# Patient Record
Sex: Female | Born: 1940 | Race: White | Hispanic: No | Marital: Married | Smoking: Former smoker
Health system: Southern US, Community
[De-identification: ages and names within clinical notes are randomized; demographics above are authoritative.]

## PROBLEM LIST (undated history)

## (undated) DIAGNOSIS — B029 Zoster without complications: Secondary | ICD-10-CM

## (undated) DIAGNOSIS — I10 Essential (primary) hypertension: Secondary | ICD-10-CM

## (undated) DIAGNOSIS — N183 Chronic kidney disease, stage 3 unspecified: Secondary | ICD-10-CM

## (undated) DIAGNOSIS — I493 Ventricular premature depolarization: Secondary | ICD-10-CM

## (undated) DIAGNOSIS — I340 Nonrheumatic mitral (valve) insufficiency: Secondary | ICD-10-CM

## (undated) DIAGNOSIS — Z8719 Personal history of other diseases of the digestive system: Secondary | ICD-10-CM

## (undated) DIAGNOSIS — Z9889 Other specified postprocedural states: Secondary | ICD-10-CM

## (undated) DIAGNOSIS — N3281 Overactive bladder: Secondary | ICD-10-CM

## (undated) DIAGNOSIS — J189 Pneumonia, unspecified organism: Secondary | ICD-10-CM

## (undated) DIAGNOSIS — I951 Orthostatic hypotension: Secondary | ICD-10-CM

## (undated) DIAGNOSIS — K219 Gastro-esophageal reflux disease without esophagitis: Secondary | ICD-10-CM

## (undated) DIAGNOSIS — A498 Other bacterial infections of unspecified site: Secondary | ICD-10-CM

## (undated) DIAGNOSIS — N200 Calculus of kidney: Secondary | ICD-10-CM

## (undated) DIAGNOSIS — G459 Transient cerebral ischemic attack, unspecified: Secondary | ICD-10-CM

## (undated) DIAGNOSIS — I639 Cerebral infarction, unspecified: Secondary | ICD-10-CM

## (undated) DIAGNOSIS — C4491 Basal cell carcinoma of skin, unspecified: Secondary | ICD-10-CM

## (undated) DIAGNOSIS — J449 Chronic obstructive pulmonary disease, unspecified: Secondary | ICD-10-CM

## (undated) DIAGNOSIS — F329 Major depressive disorder, single episode, unspecified: Secondary | ICD-10-CM

## (undated) DIAGNOSIS — K222 Esophageal obstruction: Secondary | ICD-10-CM

## (undated) DIAGNOSIS — I2510 Atherosclerotic heart disease of native coronary artery without angina pectoris: Secondary | ICD-10-CM

## (undated) DIAGNOSIS — J309 Allergic rhinitis, unspecified: Secondary | ICD-10-CM

## (undated) DIAGNOSIS — I5042 Chronic combined systolic (congestive) and diastolic (congestive) heart failure: Secondary | ICD-10-CM

## (undated) DIAGNOSIS — I428 Other cardiomyopathies: Secondary | ICD-10-CM

## (undated) DIAGNOSIS — R911 Solitary pulmonary nodule: Secondary | ICD-10-CM

## (undated) DIAGNOSIS — R55 Syncope and collapse: Secondary | ICD-10-CM

## (undated) DIAGNOSIS — M1990 Unspecified osteoarthritis, unspecified site: Secondary | ICD-10-CM

## (undated) DIAGNOSIS — K559 Vascular disorder of intestine, unspecified: Secondary | ICD-10-CM

## (undated) DIAGNOSIS — E785 Hyperlipidemia, unspecified: Secondary | ICD-10-CM

## (undated) DIAGNOSIS — K449 Diaphragmatic hernia without obstruction or gangrene: Secondary | ICD-10-CM

## (undated) DIAGNOSIS — H269 Unspecified cataract: Secondary | ICD-10-CM

## (undated) DIAGNOSIS — I480 Paroxysmal atrial fibrillation: Secondary | ICD-10-CM

## (undated) DIAGNOSIS — R001 Bradycardia, unspecified: Secondary | ICD-10-CM

## (undated) DIAGNOSIS — F419 Anxiety disorder, unspecified: Secondary | ICD-10-CM

## (undated) DIAGNOSIS — I253 Aneurysm of heart: Secondary | ICD-10-CM

## (undated) DIAGNOSIS — Z87442 Personal history of urinary calculi: Secondary | ICD-10-CM

## (undated) HISTORY — DX: Chronic combined systolic (congestive) and diastolic (congestive) heart failure: I50.42

## (undated) HISTORY — DX: Paroxysmal atrial fibrillation: I48.0

## (undated) HISTORY — DX: Unspecified osteoarthritis, unspecified site: M19.90

## (undated) HISTORY — PX: COLONOSCOPY: SHX174

## (undated) HISTORY — DX: Vascular disorder of intestine, unspecified: K55.9

## (undated) HISTORY — DX: Hyperlipidemia, unspecified: E78.5

## (undated) HISTORY — DX: Basal cell carcinoma of skin, unspecified: C44.91

## (undated) HISTORY — DX: Unspecified cataract: H26.9

## (undated) HISTORY — PX: UPPER GASTROINTESTINAL ENDOSCOPY: SHX188

## (undated) HISTORY — DX: Calculus of kidney: N20.0

## (undated) HISTORY — PX: VARICOSE VEIN SURGERY: SHX832

## (undated) HISTORY — DX: Overactive bladder: N32.81

## (undated) HISTORY — DX: Syncope and collapse: R55

## (undated) HISTORY — DX: Other bacterial infections of unspecified site: A49.8

## (undated) HISTORY — DX: Esophageal obstruction: K22.2

## (undated) HISTORY — DX: Major depressive disorder, single episode, unspecified: F32.9

## (undated) HISTORY — DX: Aneurysm of heart: I25.3

## (undated) HISTORY — DX: Other cardiomyopathies: I42.8

## (undated) HISTORY — PX: BLADDER SUSPENSION: SHX72

## (undated) HISTORY — DX: Personal history of other diseases of the digestive system: Z87.19

## (undated) HISTORY — DX: Chronic kidney disease, stage 3 unspecified: N18.30

## (undated) HISTORY — DX: Transient cerebral ischemic attack, unspecified: G45.9

## (undated) HISTORY — PX: OTHER SURGICAL HISTORY: SHX169

## (undated) HISTORY — PX: FINGER SURGERY: SHX640

## (undated) HISTORY — DX: Pneumonia, unspecified organism: J18.9

## (undated) HISTORY — DX: Bradycardia, unspecified: R00.1

## (undated) HISTORY — DX: Chronic obstructive pulmonary disease, unspecified: J44.9

## (undated) HISTORY — DX: Ventricular premature depolarization: I49.3

## (undated) HISTORY — DX: Diaphragmatic hernia without obstruction or gangrene: K44.9

## (undated) HISTORY — DX: Other specified postprocedural states: Z98.890

## (undated) HISTORY — DX: Allergic rhinitis, unspecified: J30.9

## (undated) HISTORY — DX: Gastro-esophageal reflux disease without esophagitis: K21.9

## (undated) HISTORY — DX: Orthostatic hypotension: I95.1

## (undated) HISTORY — DX: Solitary pulmonary nodule: R91.1

## (undated) HISTORY — DX: Cerebral infarction, unspecified: I63.9

## (undated) HISTORY — DX: Essential (primary) hypertension: I10

## (undated) HISTORY — DX: Chronic kidney disease, stage 3 (moderate): N18.3

## (undated) HISTORY — DX: Atherosclerotic heart disease of native coronary artery without angina pectoris: I25.10

## (undated) HISTORY — PX: BREAST BIOPSY: SHX20

---

## 1981-12-05 HISTORY — PX: VAGINAL HYSTERECTOMY: SUR661

## 1998-04-21 ENCOUNTER — Ambulatory Visit (HOSPITAL_COMMUNITY): Admission: RE | Admit: 1998-04-21 | Discharge: 1998-04-21 | Payer: Self-pay | Admitting: Obstetrics and Gynecology

## 1998-05-26 ENCOUNTER — Other Ambulatory Visit: Admission: RE | Admit: 1998-05-26 | Discharge: 1998-05-26 | Payer: Self-pay | Admitting: Obstetrics and Gynecology

## 1999-05-21 ENCOUNTER — Ambulatory Visit (HOSPITAL_COMMUNITY): Admission: RE | Admit: 1999-05-21 | Discharge: 1999-05-21 | Payer: Self-pay | Admitting: Obstetrics and Gynecology

## 1999-06-11 ENCOUNTER — Other Ambulatory Visit: Admission: RE | Admit: 1999-06-11 | Discharge: 1999-06-11 | Payer: Self-pay | Admitting: Obstetrics and Gynecology

## 1999-12-06 DIAGNOSIS — I639 Cerebral infarction, unspecified: Secondary | ICD-10-CM

## 1999-12-06 HISTORY — DX: Cerebral infarction, unspecified: I63.9

## 2000-05-22 ENCOUNTER — Encounter: Payer: Self-pay | Admitting: Obstetrics and Gynecology

## 2000-05-22 ENCOUNTER — Ambulatory Visit (HOSPITAL_COMMUNITY): Admission: RE | Admit: 2000-05-22 | Discharge: 2000-05-22 | Payer: Self-pay | Admitting: Obstetrics and Gynecology

## 2000-06-16 ENCOUNTER — Other Ambulatory Visit: Admission: RE | Admit: 2000-06-16 | Discharge: 2000-06-16 | Payer: Self-pay | Admitting: Obstetrics and Gynecology

## 2001-04-12 ENCOUNTER — Ambulatory Visit (HOSPITAL_COMMUNITY): Admission: RE | Admit: 2001-04-12 | Discharge: 2001-04-12 | Payer: Self-pay | Admitting: Gastroenterology

## 2001-05-05 ENCOUNTER — Encounter: Payer: Self-pay | Admitting: Internal Medicine

## 2001-05-05 LAB — CONVERTED CEMR LAB

## 2001-05-30 ENCOUNTER — Ambulatory Visit (HOSPITAL_COMMUNITY): Admission: RE | Admit: 2001-05-30 | Discharge: 2001-05-30 | Payer: Self-pay | Admitting: Obstetrics and Gynecology

## 2001-05-30 ENCOUNTER — Encounter: Payer: Self-pay | Admitting: Obstetrics and Gynecology

## 2001-06-01 ENCOUNTER — Encounter: Admission: RE | Admit: 2001-06-01 | Discharge: 2001-06-01 | Payer: Self-pay | Admitting: Obstetrics and Gynecology

## 2001-06-01 ENCOUNTER — Encounter: Payer: Self-pay | Admitting: Obstetrics and Gynecology

## 2001-06-18 ENCOUNTER — Other Ambulatory Visit: Admission: RE | Admit: 2001-06-18 | Discharge: 2001-06-18 | Payer: Self-pay | Admitting: Obstetrics and Gynecology

## 2002-06-06 ENCOUNTER — Ambulatory Visit (HOSPITAL_COMMUNITY): Admission: RE | Admit: 2002-06-06 | Discharge: 2002-06-06 | Payer: Self-pay | Admitting: Obstetrics and Gynecology

## 2002-06-06 ENCOUNTER — Encounter: Payer: Self-pay | Admitting: Obstetrics and Gynecology

## 2002-06-18 ENCOUNTER — Other Ambulatory Visit: Admission: RE | Admit: 2002-06-18 | Discharge: 2002-06-18 | Payer: Self-pay | Admitting: Obstetrics and Gynecology

## 2002-08-31 ENCOUNTER — Encounter: Payer: Self-pay | Admitting: Internal Medicine

## 2002-08-31 ENCOUNTER — Encounter: Admission: RE | Admit: 2002-08-31 | Discharge: 2002-08-31 | Payer: Self-pay | Admitting: Internal Medicine

## 2002-10-01 ENCOUNTER — Encounter: Admission: RE | Admit: 2002-10-01 | Discharge: 2002-10-01 | Payer: Self-pay | Admitting: Neurology

## 2002-10-01 ENCOUNTER — Encounter: Payer: Self-pay | Admitting: Neurology

## 2002-11-25 ENCOUNTER — Ambulatory Visit (HOSPITAL_COMMUNITY): Admission: RE | Admit: 2002-11-25 | Discharge: 2002-11-25 | Payer: Self-pay | Admitting: Cardiology

## 2002-11-25 ENCOUNTER — Encounter: Payer: Self-pay | Admitting: Cardiology

## 2003-03-25 ENCOUNTER — Ambulatory Visit (HOSPITAL_COMMUNITY): Admission: RE | Admit: 2003-03-25 | Discharge: 2003-03-25 | Payer: Self-pay | Admitting: Gastroenterology

## 2003-03-25 LAB — HM COLONOSCOPY

## 2003-03-26 ENCOUNTER — Ambulatory Visit (HOSPITAL_COMMUNITY): Admission: RE | Admit: 2003-03-26 | Discharge: 2003-03-26 | Payer: Self-pay | Admitting: Gastroenterology

## 2003-03-26 ENCOUNTER — Encounter: Payer: Self-pay | Admitting: Gastroenterology

## 2003-06-05 ENCOUNTER — Encounter: Admission: RE | Admit: 2003-06-05 | Discharge: 2003-09-03 | Payer: Self-pay | Admitting: Gastroenterology

## 2003-06-11 ENCOUNTER — Ambulatory Visit (HOSPITAL_COMMUNITY): Admission: RE | Admit: 2003-06-11 | Discharge: 2003-06-11 | Payer: Self-pay | Admitting: Obstetrics and Gynecology

## 2003-06-11 ENCOUNTER — Encounter: Payer: Self-pay | Admitting: Obstetrics and Gynecology

## 2003-09-24 ENCOUNTER — Ambulatory Visit (HOSPITAL_BASED_OUTPATIENT_CLINIC_OR_DEPARTMENT_OTHER): Admission: RE | Admit: 2003-09-24 | Discharge: 2003-09-24 | Payer: Self-pay | Admitting: *Deleted

## 2003-09-24 ENCOUNTER — Ambulatory Visit (HOSPITAL_COMMUNITY): Admission: RE | Admit: 2003-09-24 | Discharge: 2003-09-24 | Payer: Self-pay | Admitting: *Deleted

## 2004-06-11 ENCOUNTER — Ambulatory Visit (HOSPITAL_COMMUNITY): Admission: RE | Admit: 2004-06-11 | Discharge: 2004-06-11 | Payer: Self-pay | Admitting: Obstetrics and Gynecology

## 2004-07-15 ENCOUNTER — Other Ambulatory Visit: Admission: RE | Admit: 2004-07-15 | Discharge: 2004-07-15 | Payer: Self-pay | Admitting: Obstetrics and Gynecology

## 2004-10-14 ENCOUNTER — Ambulatory Visit: Payer: Self-pay | Admitting: Internal Medicine

## 2004-10-20 ENCOUNTER — Ambulatory Visit: Payer: Self-pay | Admitting: *Deleted

## 2004-11-10 ENCOUNTER — Ambulatory Visit: Payer: Self-pay | Admitting: Cardiology

## 2004-11-24 ENCOUNTER — Ambulatory Visit: Payer: Self-pay | Admitting: *Deleted

## 2004-12-22 ENCOUNTER — Ambulatory Visit: Payer: Self-pay | Admitting: Cardiology

## 2005-01-13 ENCOUNTER — Ambulatory Visit: Payer: Self-pay | Admitting: Internal Medicine

## 2005-01-19 ENCOUNTER — Ambulatory Visit: Payer: Self-pay | Admitting: Cardiovascular Disease

## 2005-02-07 ENCOUNTER — Ambulatory Visit: Payer: Self-pay | Admitting: Cardiology

## 2005-02-15 ENCOUNTER — Ambulatory Visit: Payer: Self-pay | Admitting: Internal Medicine

## 2005-02-23 ENCOUNTER — Ambulatory Visit: Payer: Self-pay | Admitting: Cardiovascular Disease

## 2005-03-16 ENCOUNTER — Ambulatory Visit: Payer: Self-pay | Admitting: Internal Medicine

## 2005-03-31 ENCOUNTER — Ambulatory Visit: Payer: Self-pay | Admitting: Internal Medicine

## 2005-04-11 ENCOUNTER — Encounter: Admission: RE | Admit: 2005-04-11 | Discharge: 2005-07-10 | Payer: Self-pay | Admitting: Family Medicine

## 2005-04-11 ENCOUNTER — Ambulatory Visit: Payer: Self-pay | Admitting: *Deleted

## 2005-05-09 ENCOUNTER — Ambulatory Visit: Payer: Self-pay | Admitting: Cardiology

## 2005-06-06 ENCOUNTER — Ambulatory Visit: Payer: Self-pay | Admitting: Cardiology

## 2005-06-13 ENCOUNTER — Ambulatory Visit (HOSPITAL_COMMUNITY): Admission: RE | Admit: 2005-06-13 | Discharge: 2005-06-13 | Payer: Self-pay | Admitting: Obstetrics and Gynecology

## 2005-07-04 ENCOUNTER — Ambulatory Visit: Payer: Self-pay | Admitting: Cardiology

## 2005-07-18 ENCOUNTER — Other Ambulatory Visit: Admission: RE | Admit: 2005-07-18 | Discharge: 2005-07-18 | Payer: Self-pay | Admitting: Obstetrics and Gynecology

## 2005-07-18 ENCOUNTER — Ambulatory Visit: Payer: Self-pay | Admitting: Internal Medicine

## 2005-08-01 ENCOUNTER — Ambulatory Visit: Payer: Self-pay | Admitting: Cardiology

## 2005-08-15 ENCOUNTER — Ambulatory Visit: Payer: Self-pay | Admitting: Internal Medicine

## 2005-08-29 ENCOUNTER — Ambulatory Visit: Payer: Self-pay | Admitting: Internal Medicine

## 2005-09-23 ENCOUNTER — Ambulatory Visit: Payer: Self-pay | Admitting: Cardiovascular Disease

## 2005-10-17 ENCOUNTER — Ambulatory Visit: Payer: Self-pay | Admitting: Cardiology

## 2005-10-24 ENCOUNTER — Ambulatory Visit: Payer: Self-pay | Admitting: Internal Medicine

## 2005-11-14 ENCOUNTER — Ambulatory Visit: Payer: Self-pay | Admitting: Cardiology

## 2005-11-24 ENCOUNTER — Ambulatory Visit: Payer: Self-pay | Admitting: Internal Medicine

## 2005-12-12 ENCOUNTER — Ambulatory Visit: Payer: Self-pay | Admitting: Cardiology

## 2005-12-26 ENCOUNTER — Ambulatory Visit: Payer: Self-pay | Admitting: Internal Medicine

## 2006-01-16 ENCOUNTER — Ambulatory Visit: Payer: Self-pay | Admitting: Internal Medicine

## 2006-02-06 ENCOUNTER — Ambulatory Visit: Payer: Self-pay | Admitting: Cardiology

## 2006-03-06 ENCOUNTER — Ambulatory Visit: Payer: Self-pay | Admitting: Cardiology

## 2006-04-05 ENCOUNTER — Ambulatory Visit: Payer: Self-pay | Admitting: Cardiology

## 2006-04-19 ENCOUNTER — Ambulatory Visit: Payer: Self-pay | Admitting: Cardiology

## 2006-05-22 ENCOUNTER — Ambulatory Visit: Payer: Self-pay | Admitting: Cardiology

## 2006-05-31 ENCOUNTER — Ambulatory Visit: Payer: Self-pay | Admitting: Internal Medicine

## 2006-06-15 ENCOUNTER — Ambulatory Visit (HOSPITAL_COMMUNITY): Admission: RE | Admit: 2006-06-15 | Discharge: 2006-06-15 | Payer: Self-pay | Admitting: Obstetrics and Gynecology

## 2006-06-21 ENCOUNTER — Ambulatory Visit: Payer: Self-pay | Admitting: Cardiovascular Disease

## 2006-06-26 ENCOUNTER — Encounter (INDEPENDENT_AMBULATORY_CARE_PROVIDER_SITE_OTHER): Payer: Self-pay | Admitting: *Deleted

## 2006-06-26 ENCOUNTER — Encounter: Admission: RE | Admit: 2006-06-26 | Discharge: 2006-06-26 | Payer: Self-pay | Admitting: Obstetrics and Gynecology

## 2006-07-03 HISTORY — PX: BREAST BIOPSY: SHX20

## 2006-07-10 ENCOUNTER — Ambulatory Visit: Payer: Self-pay | Admitting: Internal Medicine

## 2006-07-19 ENCOUNTER — Ambulatory Visit: Payer: Self-pay | Admitting: Cardiology

## 2006-07-19 ENCOUNTER — Other Ambulatory Visit: Admission: RE | Admit: 2006-07-19 | Discharge: 2006-07-19 | Payer: Self-pay | Admitting: Obstetrics and Gynecology

## 2006-08-14 ENCOUNTER — Ambulatory Visit: Payer: Self-pay | Admitting: Internal Medicine

## 2006-08-14 ENCOUNTER — Ambulatory Visit: Payer: Self-pay | Admitting: Cardiology

## 2006-08-15 ENCOUNTER — Ambulatory Visit: Payer: Self-pay | Admitting: Cardiovascular Disease

## 2006-08-17 ENCOUNTER — Inpatient Hospital Stay (HOSPITAL_COMMUNITY): Admission: RE | Admit: 2006-08-17 | Discharge: 2006-08-19 | Payer: Self-pay | Admitting: Obstetrics and Gynecology

## 2006-08-17 ENCOUNTER — Ambulatory Visit: Payer: Self-pay | Admitting: Internal Medicine

## 2006-08-24 ENCOUNTER — Ambulatory Visit: Payer: Self-pay | Admitting: Cardiology

## 2006-09-01 ENCOUNTER — Ambulatory Visit: Payer: Self-pay | Admitting: Cardiology

## 2006-09-15 ENCOUNTER — Ambulatory Visit: Payer: Self-pay | Admitting: Internal Medicine

## 2006-09-20 ENCOUNTER — Ambulatory Visit: Payer: Self-pay | Admitting: *Deleted

## 2006-10-18 ENCOUNTER — Ambulatory Visit: Payer: Self-pay | Admitting: Cardiology

## 2006-11-16 ENCOUNTER — Ambulatory Visit: Payer: Self-pay | Admitting: Cardiology

## 2006-11-16 ENCOUNTER — Ambulatory Visit: Payer: Self-pay | Admitting: Internal Medicine

## 2006-12-05 HISTORY — PX: RECTOCELE REPAIR: SHX761

## 2006-12-12 ENCOUNTER — Ambulatory Visit: Payer: Self-pay | Admitting: Cardiology

## 2006-12-25 ENCOUNTER — Ambulatory Visit: Payer: Self-pay | Admitting: Internal Medicine

## 2006-12-28 ENCOUNTER — Ambulatory Visit: Payer: Self-pay | Admitting: Cardiology

## 2007-01-17 ENCOUNTER — Ambulatory Visit: Payer: Self-pay | Admitting: *Deleted

## 2007-02-14 ENCOUNTER — Ambulatory Visit: Payer: Self-pay | Admitting: Cardiology

## 2007-03-14 ENCOUNTER — Ambulatory Visit: Payer: Self-pay | Admitting: Internal Medicine

## 2007-04-12 ENCOUNTER — Ambulatory Visit: Payer: Self-pay | Admitting: Cardiology

## 2007-05-15 ENCOUNTER — Ambulatory Visit: Payer: Self-pay | Admitting: Cardiology

## 2007-05-31 ENCOUNTER — Ambulatory Visit: Payer: Self-pay | Admitting: Internal Medicine

## 2007-05-31 ENCOUNTER — Ambulatory Visit: Payer: Self-pay | Admitting: Cardiology

## 2007-06-28 ENCOUNTER — Ambulatory Visit: Payer: Self-pay | Admitting: Cardiology

## 2007-06-29 ENCOUNTER — Ambulatory Visit: Payer: Self-pay | Admitting: Internal Medicine

## 2007-07-26 ENCOUNTER — Ambulatory Visit: Payer: Self-pay | Admitting: Cardiology

## 2007-08-02 ENCOUNTER — Other Ambulatory Visit: Admission: RE | Admit: 2007-08-02 | Discharge: 2007-08-02 | Payer: Self-pay | Admitting: Obstetrics and Gynecology

## 2007-08-08 ENCOUNTER — Encounter: Admission: RE | Admit: 2007-08-08 | Discharge: 2007-08-08 | Payer: Self-pay | Admitting: Obstetrics and Gynecology

## 2007-08-09 ENCOUNTER — Ambulatory Visit: Payer: Self-pay | Admitting: Internal Medicine

## 2007-08-11 ENCOUNTER — Encounter: Payer: Self-pay | Admitting: Internal Medicine

## 2007-08-11 DIAGNOSIS — E785 Hyperlipidemia, unspecified: Secondary | ICD-10-CM | POA: Insufficient documentation

## 2007-08-11 DIAGNOSIS — K219 Gastro-esophageal reflux disease without esophagitis: Secondary | ICD-10-CM | POA: Insufficient documentation

## 2007-08-11 DIAGNOSIS — K649 Unspecified hemorrhoids: Secondary | ICD-10-CM | POA: Insufficient documentation

## 2007-08-11 DIAGNOSIS — Z87898 Personal history of other specified conditions: Secondary | ICD-10-CM | POA: Insufficient documentation

## 2007-08-11 DIAGNOSIS — K589 Irritable bowel syndrome without diarrhea: Secondary | ICD-10-CM | POA: Insufficient documentation

## 2007-08-11 DIAGNOSIS — Z8679 Personal history of other diseases of the circulatory system: Secondary | ICD-10-CM | POA: Insufficient documentation

## 2007-08-11 DIAGNOSIS — J441 Chronic obstructive pulmonary disease with (acute) exacerbation: Secondary | ICD-10-CM | POA: Insufficient documentation

## 2007-08-11 DIAGNOSIS — I10 Essential (primary) hypertension: Secondary | ICD-10-CM | POA: Insufficient documentation

## 2007-08-17 ENCOUNTER — Ambulatory Visit: Payer: Self-pay | Admitting: Cardiology

## 2007-08-21 ENCOUNTER — Ambulatory Visit (HOSPITAL_COMMUNITY): Admission: RE | Admit: 2007-08-21 | Discharge: 2007-08-22 | Payer: Self-pay | Admitting: Urology

## 2007-08-27 ENCOUNTER — Ambulatory Visit: Payer: Self-pay | Admitting: Internal Medicine

## 2007-09-03 ENCOUNTER — Ambulatory Visit: Payer: Self-pay | Admitting: Cardiovascular Disease

## 2007-10-01 ENCOUNTER — Ambulatory Visit: Payer: Self-pay | Admitting: Cardiology

## 2007-10-04 ENCOUNTER — Ambulatory Visit: Payer: Self-pay | Admitting: Internal Medicine

## 2007-10-04 ENCOUNTER — Encounter: Payer: Self-pay | Admitting: Internal Medicine

## 2007-10-04 DIAGNOSIS — Z9889 Other specified postprocedural states: Secondary | ICD-10-CM | POA: Insufficient documentation

## 2007-10-04 DIAGNOSIS — N816 Rectocele: Secondary | ICD-10-CM | POA: Insufficient documentation

## 2007-10-05 DIAGNOSIS — Z87442 Personal history of urinary calculi: Secondary | ICD-10-CM | POA: Insufficient documentation

## 2007-10-10 ENCOUNTER — Encounter: Payer: Self-pay | Admitting: Internal Medicine

## 2007-10-13 ENCOUNTER — Encounter: Payer: Self-pay | Admitting: Internal Medicine

## 2007-10-15 ENCOUNTER — Ambulatory Visit: Payer: Self-pay | Admitting: Internal Medicine

## 2007-10-22 ENCOUNTER — Ambulatory Visit: Payer: Self-pay | Admitting: Cardiology

## 2007-11-05 ENCOUNTER — Ambulatory Visit: Payer: Self-pay | Admitting: Critical Care Medicine

## 2007-11-05 ENCOUNTER — Ambulatory Visit: Payer: Self-pay | Admitting: Internal Medicine

## 2007-11-05 LAB — CONVERTED CEMR LAB
INR: 1.6 — ABNORMAL HIGH (ref 0.8–1.0)
Prothrombin Time: 15.5 s — ABNORMAL HIGH (ref 10.9–13.3)

## 2007-11-22 ENCOUNTER — Ambulatory Visit: Payer: Self-pay | Admitting: Cardiology

## 2007-12-10 ENCOUNTER — Ambulatory Visit: Payer: Self-pay | Admitting: Cardiology

## 2007-12-24 ENCOUNTER — Ambulatory Visit: Payer: Self-pay | Admitting: Cardiology

## 2008-01-21 ENCOUNTER — Ambulatory Visit: Payer: Self-pay | Admitting: Cardiovascular Disease

## 2008-01-28 ENCOUNTER — Ambulatory Visit: Payer: Self-pay | Admitting: Critical Care Medicine

## 2008-02-14 ENCOUNTER — Telehealth (INDEPENDENT_AMBULATORY_CARE_PROVIDER_SITE_OTHER): Payer: Self-pay | Admitting: *Deleted

## 2008-02-15 ENCOUNTER — Ambulatory Visit: Payer: Self-pay | Admitting: Critical Care Medicine

## 2008-02-18 ENCOUNTER — Ambulatory Visit: Payer: Self-pay | Admitting: Internal Medicine

## 2008-02-20 ENCOUNTER — Ambulatory Visit: Payer: Self-pay | Admitting: Vascular Surgery

## 2008-02-20 ENCOUNTER — Encounter: Payer: Self-pay | Admitting: Internal Medicine

## 2008-02-21 ENCOUNTER — Encounter: Payer: Self-pay | Admitting: Critical Care Medicine

## 2008-03-03 ENCOUNTER — Ambulatory Visit: Payer: Self-pay | Admitting: Cardiology

## 2008-03-18 ENCOUNTER — Ambulatory Visit: Payer: Self-pay | Admitting: Critical Care Medicine

## 2008-03-19 ENCOUNTER — Encounter: Payer: Self-pay | Admitting: Critical Care Medicine

## 2008-03-31 ENCOUNTER — Ambulatory Visit: Payer: Self-pay | Admitting: Cardiovascular Disease

## 2008-04-24 ENCOUNTER — Ambulatory Visit: Payer: Self-pay | Admitting: Critical Care Medicine

## 2008-04-24 DIAGNOSIS — J309 Allergic rhinitis, unspecified: Secondary | ICD-10-CM | POA: Insufficient documentation

## 2008-04-30 ENCOUNTER — Ambulatory Visit: Payer: Self-pay | Admitting: Cardiology

## 2008-05-23 ENCOUNTER — Ambulatory Visit: Payer: Self-pay | Admitting: Vascular Surgery

## 2008-05-28 ENCOUNTER — Ambulatory Visit: Payer: Self-pay | Admitting: Internal Medicine

## 2008-06-05 ENCOUNTER — Ambulatory Visit: Payer: Self-pay | Admitting: Critical Care Medicine

## 2008-06-05 ENCOUNTER — Ambulatory Visit: Payer: Self-pay | Admitting: Internal Medicine

## 2008-06-05 DIAGNOSIS — R5381 Other malaise: Secondary | ICD-10-CM | POA: Insufficient documentation

## 2008-06-05 DIAGNOSIS — R5383 Other fatigue: Secondary | ICD-10-CM

## 2008-06-06 LAB — CONVERTED CEMR LAB
ALT: 22 units/L (ref 0–35)
AST: 27 units/L (ref 0–37)
Albumin: 4.6 g/dL (ref 3.5–5.2)
Alkaline Phosphatase: 60 units/L (ref 39–117)
BUN: 26 mg/dL — ABNORMAL HIGH (ref 6–23)
Basophils Absolute: 0 10*3/uL (ref 0.0–0.1)
Basophils Relative: 0.7 % (ref 0.0–1.0)
Bilirubin, Direct: 0.1 mg/dL (ref 0.0–0.3)
CO2: 30 meq/L (ref 19–32)
Calcium: 9.9 mg/dL (ref 8.4–10.5)
Chloride: 103 meq/L (ref 96–112)
Cholesterol: 202 mg/dL (ref 0–200)
Creatinine, Ser: 0.9 mg/dL (ref 0.4–1.2)
Direct LDL: 123.3 mg/dL
Eosinophils Absolute: 0.1 10*3/uL (ref 0.0–0.7)
Eosinophils Relative: 1.3 % (ref 0.0–5.0)
GFR calc Af Amer: 80 mL/min
GFR calc non Af Amer: 66 mL/min
Glucose, Bld: 101 mg/dL — ABNORMAL HIGH (ref 70–99)
HCT: 44.2 % (ref 36.0–46.0)
HDL: 53.6 mg/dL (ref >39.0)
Hemoglobin: 15.5 g/dL — ABNORMAL HIGH (ref 12.0–15.0)
Lymphocytes Relative: 33.1 % (ref 12.0–46.0)
MCHC: 35.1 g/dL (ref 30.0–36.0)
MCV: 85.8 fL (ref 78.0–100.0)
Monocytes Absolute: 0.5 10*3/uL (ref 0.1–1.0)
Monocytes Relative: 13.2 % — ABNORMAL HIGH (ref 3.0–12.0)
Neutro Abs: 2 10*3/uL (ref 1.4–7.7)
Neutrophils Relative %: 51.7 % (ref 43.0–77.0)
Platelets: 178 10*3/uL (ref 150–400)
Potassium: 4.7 meq/L (ref 3.5–5.1)
RBC: 5.15 M/uL — ABNORMAL HIGH (ref 3.87–5.11)
RDW: 13.9 % (ref 11.5–14.6)
Sodium: 140 meq/L (ref 135–145)
TSH: 1.44 microintl units/mL (ref 0.35–5.50)
Total Bilirubin: 0.9 mg/dL (ref 0.3–1.2)
Total CHOL/HDL Ratio: 3.8
Total Protein: 7.2 g/dL (ref 6.0–8.3)
Triglycerides: 145 mg/dL (ref 0–149)
VLDL: 29 mg/dL (ref 0–40)
WBC: 3.9 10*3/uL — ABNORMAL LOW (ref 4.5–10.5)

## 2008-06-09 ENCOUNTER — Telehealth (INDEPENDENT_AMBULATORY_CARE_PROVIDER_SITE_OTHER): Payer: Self-pay | Admitting: *Deleted

## 2008-06-11 ENCOUNTER — Ambulatory Visit: Payer: Self-pay | Admitting: Vascular Surgery

## 2008-06-16 ENCOUNTER — Ambulatory Visit: Payer: Self-pay | Admitting: Cardiology

## 2008-06-18 ENCOUNTER — Ambulatory Visit: Payer: Self-pay | Admitting: Vascular Surgery

## 2008-06-24 ENCOUNTER — Ambulatory Visit: Payer: Self-pay | Admitting: Vascular Surgery

## 2008-06-24 ENCOUNTER — Ambulatory Visit: Payer: Self-pay | Admitting: Cardiology

## 2008-07-14 ENCOUNTER — Ambulatory Visit: Payer: Self-pay | Admitting: Cardiology

## 2008-07-28 ENCOUNTER — Ambulatory Visit: Payer: Self-pay | Admitting: Internal Medicine

## 2008-08-13 ENCOUNTER — Ambulatory Visit: Payer: Self-pay | Admitting: Internal Medicine

## 2008-08-13 ENCOUNTER — Encounter: Admission: RE | Admit: 2008-08-13 | Discharge: 2008-08-13 | Payer: Self-pay | Admitting: Obstetrics and Gynecology

## 2008-08-20 ENCOUNTER — Ambulatory Visit: Payer: Self-pay | Admitting: Obstetrics and Gynecology

## 2008-09-03 ENCOUNTER — Ambulatory Visit: Payer: Self-pay | Admitting: Cardiology

## 2008-09-24 ENCOUNTER — Ambulatory Visit: Payer: Self-pay | Admitting: Cardiology

## 2008-10-22 ENCOUNTER — Ambulatory Visit: Payer: Self-pay | Admitting: Cardiology

## 2008-11-19 ENCOUNTER — Ambulatory Visit: Payer: Self-pay | Admitting: Cardiology

## 2008-12-17 ENCOUNTER — Ambulatory Visit: Payer: Self-pay | Admitting: Cardiology

## 2008-12-31 ENCOUNTER — Ambulatory Visit: Payer: Self-pay | Admitting: Cardiology

## 2009-01-28 ENCOUNTER — Ambulatory Visit: Payer: Self-pay | Admitting: Cardiology

## 2009-02-18 ENCOUNTER — Ambulatory Visit: Payer: Self-pay | Admitting: Cardiology

## 2009-03-18 ENCOUNTER — Ambulatory Visit: Payer: Self-pay | Admitting: Internal Medicine

## 2009-03-25 ENCOUNTER — Ambulatory Visit: Payer: Self-pay | Admitting: Internal Medicine

## 2009-03-25 DIAGNOSIS — G459 Transient cerebral ischemic attack, unspecified: Secondary | ICD-10-CM | POA: Insufficient documentation

## 2009-03-25 LAB — CONVERTED CEMR LAB
Bilirubin Urine: NEGATIVE
Hemoglobin, Urine: NEGATIVE
Ketones, ur: NEGATIVE mg/dL
Leukocytes, UA: NEGATIVE
Nitrite: NEGATIVE
Specific Gravity, Urine: 1.01 (ref 1.000–1.030)
Total Protein, Urine: NEGATIVE mg/dL
Urine Glucose: NEGATIVE mg/dL
Urobilinogen, UA: 0.2 (ref 0.0–1.0)
pH: 7 (ref 5.0–8.0)

## 2009-03-26 ENCOUNTER — Encounter: Payer: Self-pay | Admitting: Internal Medicine

## 2009-04-07 ENCOUNTER — Ambulatory Visit: Payer: Self-pay

## 2009-04-07 ENCOUNTER — Encounter: Payer: Self-pay | Admitting: Internal Medicine

## 2009-04-09 ENCOUNTER — Encounter: Admission: RE | Admit: 2009-04-09 | Discharge: 2009-04-09 | Payer: Self-pay | Admitting: Internal Medicine

## 2009-04-15 ENCOUNTER — Ambulatory Visit: Payer: Self-pay | Admitting: Cardiology

## 2009-05-05 ENCOUNTER — Encounter: Payer: Self-pay | Admitting: *Deleted

## 2009-05-06 ENCOUNTER — Ambulatory Visit: Payer: Self-pay | Admitting: Cardiology

## 2009-05-06 LAB — CONVERTED CEMR LAB
POC INR: 3.3
Protime: 21.8

## 2009-05-27 ENCOUNTER — Ambulatory Visit: Payer: Self-pay | Admitting: Internal Medicine

## 2009-05-27 LAB — CONVERTED CEMR LAB
POC INR: 2.1
Prothrombin Time: 17.8 s

## 2009-06-10 ENCOUNTER — Encounter: Payer: Self-pay | Admitting: *Deleted

## 2009-06-24 ENCOUNTER — Ambulatory Visit: Payer: Self-pay | Admitting: Cardiology

## 2009-06-24 LAB — CONVERTED CEMR LAB
POC INR: 1.7
Prothrombin Time: 16.1 s

## 2009-07-08 ENCOUNTER — Ambulatory Visit: Payer: Self-pay | Admitting: Cardiology

## 2009-07-08 LAB — CONVERTED CEMR LAB
POC INR: 2.8
Prothrombin Time: 20.1 s

## 2009-07-23 ENCOUNTER — Ambulatory Visit: Payer: Self-pay | Admitting: Internal Medicine

## 2009-07-29 ENCOUNTER — Ambulatory Visit: Payer: Self-pay | Admitting: Internal Medicine

## 2009-07-29 LAB — CONVERTED CEMR LAB: POC INR: 4.2

## 2009-08-04 LAB — CONVERTED CEMR LAB: Pap Smear: NORMAL

## 2009-08-04 LAB — HM MAMMOGRAPHY: HM Mammogram: NORMAL

## 2009-08-18 ENCOUNTER — Ambulatory Visit: Payer: Self-pay | Admitting: Cardiology

## 2009-08-18 ENCOUNTER — Encounter: Admission: RE | Admit: 2009-08-18 | Discharge: 2009-08-18 | Payer: Self-pay | Admitting: Obstetrics and Gynecology

## 2009-08-18 LAB — CONVERTED CEMR LAB: POC INR: 2.5

## 2009-08-24 ENCOUNTER — Encounter: Payer: Self-pay | Admitting: Obstetrics and Gynecology

## 2009-08-24 ENCOUNTER — Other Ambulatory Visit: Admission: RE | Admit: 2009-08-24 | Discharge: 2009-08-24 | Payer: Self-pay | Admitting: Obstetrics and Gynecology

## 2009-08-24 ENCOUNTER — Ambulatory Visit: Payer: Self-pay | Admitting: Obstetrics and Gynecology

## 2009-09-10 ENCOUNTER — Ambulatory Visit: Payer: Self-pay | Admitting: Internal Medicine

## 2009-09-10 LAB — CONVERTED CEMR LAB: POC INR: 2.3

## 2009-09-15 ENCOUNTER — Ambulatory Visit: Payer: Self-pay | Admitting: Internal Medicine

## 2009-09-15 ENCOUNTER — Encounter: Payer: Self-pay | Admitting: Internal Medicine

## 2009-10-08 ENCOUNTER — Ambulatory Visit: Payer: Self-pay | Admitting: Internal Medicine

## 2009-10-08 LAB — CONVERTED CEMR LAB: POC INR: 2.1

## 2009-11-05 ENCOUNTER — Ambulatory Visit: Payer: Self-pay | Admitting: Cardiovascular Disease

## 2009-11-05 LAB — CONVERTED CEMR LAB
INR: 1.7
POC INR: 1.7

## 2009-11-26 ENCOUNTER — Ambulatory Visit: Payer: Self-pay | Admitting: Cardiology

## 2009-11-26 LAB — CONVERTED CEMR LAB: POC INR: 2

## 2009-12-05 HISTORY — PX: OTHER SURGICAL HISTORY: SHX169

## 2009-12-24 ENCOUNTER — Ambulatory Visit: Payer: Self-pay | Admitting: Cardiology

## 2009-12-24 LAB — CONVERTED CEMR LAB: POC INR: 2.2

## 2010-01-21 ENCOUNTER — Ambulatory Visit: Payer: Self-pay | Admitting: Cardiology

## 2010-01-21 LAB — CONVERTED CEMR LAB: POC INR: 2.2

## 2010-02-11 ENCOUNTER — Telehealth: Payer: Self-pay | Admitting: Internal Medicine

## 2010-02-18 ENCOUNTER — Ambulatory Visit: Payer: Self-pay | Admitting: Internal Medicine

## 2010-02-18 LAB — CONVERTED CEMR LAB: POC INR: 2.2

## 2010-03-18 ENCOUNTER — Ambulatory Visit: Payer: Self-pay | Admitting: Internal Medicine

## 2010-03-18 LAB — CONVERTED CEMR LAB: POC INR: 2.3

## 2010-04-15 ENCOUNTER — Ambulatory Visit: Payer: Self-pay | Admitting: Internal Medicine

## 2010-04-15 LAB — CONVERTED CEMR LAB: POC INR: 2.4

## 2010-05-13 ENCOUNTER — Ambulatory Visit: Payer: Self-pay | Admitting: Internal Medicine

## 2010-05-13 LAB — CONVERTED CEMR LAB: POC INR: 2.4

## 2010-06-10 ENCOUNTER — Ambulatory Visit: Payer: Self-pay | Admitting: Internal Medicine

## 2010-06-10 LAB — CONVERTED CEMR LAB: POC INR: 2

## 2010-07-08 ENCOUNTER — Ambulatory Visit: Payer: Self-pay | Admitting: Cardiovascular Disease

## 2010-07-08 LAB — CONVERTED CEMR LAB: POC INR: 2.4

## 2010-08-04 ENCOUNTER — Ambulatory Visit: Payer: Self-pay | Admitting: Cardiovascular Disease

## 2010-08-04 ENCOUNTER — Ambulatory Visit: Payer: Self-pay | Admitting: Internal Medicine

## 2010-08-04 ENCOUNTER — Encounter: Payer: Self-pay | Admitting: Internal Medicine

## 2010-08-04 LAB — CONVERTED CEMR LAB
ALT: 19 units/L (ref 0–35)
AST: 24 units/L (ref 0–37)
Albumin: 4.6 g/dL (ref 3.5–5.2)
Alkaline Phosphatase: 66 units/L (ref 39–117)
BUN: 19 mg/dL (ref 6–23)
Basophils Absolute: 0 10*3/uL (ref 0.0–0.1)
Basophils Relative: 0.7 % (ref 0.0–3.0)
Bilirubin Urine: NEGATIVE
Bilirubin, Direct: 0.1 mg/dL (ref 0.0–0.3)
CO2: 31 meq/L (ref 19–32)
Calcium: 9.9 mg/dL (ref 8.4–10.5)
Chloride: 102 meq/L (ref 96–112)
Cholesterol: 225 mg/dL — ABNORMAL HIGH (ref 0–200)
Creatinine, Ser: 0.9 mg/dL (ref 0.4–1.2)
Direct LDL: 132.4 mg/dL
Eosinophils Absolute: 0.1 10*3/uL (ref 0.0–0.7)
Eosinophils Relative: 1.5 % (ref 0.0–5.0)
GFR calc non Af Amer: 63.96 mL/min (ref >60)
Glucose, Bld: 87 mg/dL (ref 70–99)
HCT: 45.5 % (ref 36.0–46.0)
HDL: 52.8 mg/dL (ref >39.00)
Hemoglobin, Urine: NEGATIVE
Hemoglobin: 16 g/dL — ABNORMAL HIGH (ref 12.0–15.0)
Ketones, ur: NEGATIVE mg/dL
Leukocytes, UA: NEGATIVE
Lymphocytes Relative: 36.2 % (ref 12.0–46.0)
Lymphs Abs: 1.9 10*3/uL (ref 0.7–4.0)
MCHC: 35.2 g/dL (ref 30.0–36.0)
MCV: 87.7 fL (ref 78.0–100.0)
Monocytes Absolute: 0.7 10*3/uL (ref 0.1–1.0)
Monocytes Relative: 14.1 % — ABNORMAL HIGH (ref 3.0–12.0)
Neutro Abs: 2.5 10*3/uL (ref 1.4–7.7)
Neutrophils Relative %: 47.5 % (ref 43.0–77.0)
Nitrite: NEGATIVE
POC INR: 2.5
Platelets: 182 10*3/uL (ref 150.0–400.0)
Potassium: 5.3 meq/L — ABNORMAL HIGH (ref 3.5–5.1)
RBC: 5.19 M/uL — ABNORMAL HIGH (ref 3.87–5.11)
RDW: 13.4 % (ref 11.5–14.6)
Sodium: 140 meq/L (ref 135–145)
Specific Gravity, Urine: 1.01 (ref 1.000–1.030)
TSH: 1.27 microintl units/mL (ref 0.35–5.50)
Total Bilirubin: 0.8 mg/dL (ref 0.3–1.2)
Total CHOL/HDL Ratio: 4
Total Protein, Urine: NEGATIVE mg/dL
Total Protein: 6.9 g/dL (ref 6.0–8.3)
Triglycerides: 279 mg/dL — ABNORMAL HIGH (ref 0.0–149.0)
Urine Glucose: NEGATIVE mg/dL
Urobilinogen, UA: 0.2 (ref 0.0–1.0)
VLDL: 55.8 mg/dL — ABNORMAL HIGH (ref 0.0–40.0)
WBC: 5.2 10*3/uL (ref 4.5–10.5)
pH: 7.5 (ref 5.0–8.0)

## 2010-08-19 ENCOUNTER — Encounter: Admission: RE | Admit: 2010-08-19 | Discharge: 2010-08-19 | Payer: Self-pay | Admitting: Obstetrics and Gynecology

## 2010-08-25 ENCOUNTER — Other Ambulatory Visit: Admission: RE | Admit: 2010-08-25 | Discharge: 2010-08-25 | Payer: Self-pay | Admitting: Obstetrics and Gynecology

## 2010-08-25 ENCOUNTER — Ambulatory Visit: Payer: Self-pay | Admitting: Obstetrics and Gynecology

## 2010-09-01 ENCOUNTER — Ambulatory Visit: Payer: Self-pay | Admitting: Internal Medicine

## 2010-09-01 LAB — CONVERTED CEMR LAB: POC INR: 2.2

## 2010-09-29 ENCOUNTER — Ambulatory Visit: Payer: Self-pay | Admitting: Internal Medicine

## 2010-09-29 LAB — CONVERTED CEMR LAB: POC INR: 1.9

## 2010-10-26 ENCOUNTER — Ambulatory Visit: Payer: Self-pay | Admitting: Cardiovascular Disease

## 2010-10-26 LAB — CONVERTED CEMR LAB: POC INR: 2.3

## 2010-11-23 ENCOUNTER — Ambulatory Visit: Payer: Self-pay | Admitting: Cardiology

## 2010-11-23 LAB — CONVERTED CEMR LAB: POC INR: 1

## 2010-12-01 ENCOUNTER — Ambulatory Visit: Payer: Self-pay | Admitting: Cardiology

## 2010-12-01 LAB — CONVERTED CEMR LAB: POC INR: 1.7

## 2010-12-14 ENCOUNTER — Ambulatory Visit: Admission: RE | Admit: 2010-12-14 | Discharge: 2010-12-14 | Payer: Self-pay | Source: Home / Self Care

## 2010-12-14 LAB — CONVERTED CEMR LAB: POC INR: 2.4

## 2011-01-02 LAB — CONVERTED CEMR LAB
ALT: 18 units/L (ref 0–35)
AST: 28 units/L (ref 0–37)
Albumin: 4.7 g/dL (ref 3.5–5.2)
Alkaline Phosphatase: 73 units/L (ref 39–117)
BUN: 21 mg/dL (ref 6–23)
Basophils Absolute: 0.1 10*3/uL (ref 0.0–0.1)
Basophils Relative: 1.5 % (ref 0.0–3.0)
Bilirubin Urine: NEGATIVE
Bilirubin, Direct: 0.1 mg/dL (ref 0.0–0.3)
CO2: 30 meq/L (ref 19–32)
Calcium: 9.4 mg/dL (ref 8.4–10.5)
Chloride: 102 meq/L (ref 96–112)
Cholesterol: 173 mg/dL (ref 0–200)
Creatinine, Ser: 0.9 mg/dL (ref 0.4–1.2)
Eosinophils Absolute: 0.1 10*3/uL (ref 0.0–0.7)
Eosinophils Relative: 1.6 % (ref 0.0–5.0)
GFR calc non Af Amer: 65.8 mL/min (ref >60)
Glucose, Bld: 88 mg/dL (ref 70–99)
HCT: 43.9 % (ref 36.0–46.0)
HDL: 49.9 mg/dL (ref >39.00)
Hemoglobin: 14.7 g/dL (ref 12.0–15.0)
Ketones, ur: NEGATIVE mg/dL
LDL Cholesterol: 87 mg/dL (ref 0–99)
Leukocytes, UA: NEGATIVE
Lymphocytes Relative: 37.9 % (ref 12.0–46.0)
Lymphs Abs: 1.3 10*3/uL (ref 0.7–4.0)
MCHC: 33.6 g/dL (ref 30.0–36.0)
MCV: 88.6 fL (ref 78.0–100.0)
Monocytes Absolute: 0.5 10*3/uL (ref 0.1–1.0)
Monocytes Relative: 12.9 % — ABNORMAL HIGH (ref 3.0–12.0)
Neutro Abs: 1.5 10*3/uL (ref 1.4–7.7)
Neutrophils Relative %: 46.1 % (ref 43.0–77.0)
Nitrite: NEGATIVE
Pap Smear: NORMAL
Platelets: 158 10*3/uL (ref 150.0–400.0)
Potassium: 4.1 meq/L (ref 3.5–5.1)
RBC: 4.95 M/uL (ref 3.87–5.11)
RDW: 13.5 % (ref 11.5–14.6)
Sodium: 140 meq/L (ref 135–145)
Specific Gravity, Urine: <=1.005 (ref 1.000–1.030)
TSH: 1.59 microintl units/mL (ref 0.35–5.50)
Total Bilirubin: 0.7 mg/dL (ref 0.3–1.2)
Total CHOL/HDL Ratio: 3
Total Protein, Urine: NEGATIVE mg/dL
Total Protein: 7.3 g/dL (ref 6.0–8.3)
Triglycerides: 181 mg/dL — ABNORMAL HIGH (ref 0.0–149.0)
Urine Glucose: NEGATIVE mg/dL
Urobilinogen, UA: 0.2 (ref 0.0–1.0)
VLDL: 36.2 mg/dL (ref 0.0–40.0)
Vit D, 25-Hydroxy: 40 ng/mL (ref 30–89)
WBC: 3.5 10*3/uL — ABNORMAL LOW (ref 4.5–10.5)
pH: 6 (ref 5.0–8.0)

## 2011-01-04 ENCOUNTER — Ambulatory Visit: Admission: RE | Admit: 2011-01-04 | Discharge: 2011-01-04 | Payer: Self-pay | Source: Home / Self Care

## 2011-01-04 LAB — CONVERTED CEMR LAB: POC INR: 3.1

## 2011-01-05 DIAGNOSIS — Z7901 Long term (current) use of anticoagulants: Secondary | ICD-10-CM | POA: Insufficient documentation

## 2011-01-05 DIAGNOSIS — G459 Transient cerebral ischemic attack, unspecified: Secondary | ICD-10-CM

## 2011-01-05 DIAGNOSIS — Z8679 Personal history of other diseases of the circulatory system: Secondary | ICD-10-CM

## 2011-01-06 NOTE — Medication Information (Signed)
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 yes       Details: Stopped taking Crestor in October  Recent/future dental: no  Any missed doses?: no       Is patient compliant with meds? yes       Allergies: 1)  ! Biaxin  Anticoagulation Management History:      The patient is taking warfarin and comes in today for a routine follow up visit.  Positive risk factors for bleeding include an age of 72 years or older and history of CVA/TIA.  The bleeding index is 'intermediate risk'.  Positive CHADS2 values include History of HTN and Prior Stroke/CVA/TIA.  Negative CHADS2 values include Age > 23 years old.  The start date was 12/12/2002.  Her last INR was 1.7.  Anticoagulation responsible provider: Bensimhon MD, Reuel Boom.  INR POC: 2.4.  Cuvette Lot#: [id number].  Exp: 01/2012.    Anticoagulation Management Assessment/Plan:      The patient's current anticoagulation dose is Coumadin 5 mg tabs: Take as directed by coumadin clinic..  The target INR is 2 - 3.  The next INR is due 01/04/2011.  Anticoagulation instructions were given to patient.  Results were reviewed/authorized by Weston Brass, PharmD.  She was notified by Linward Headland, PharmD candidate.         Prior Anticoagulation Instructions: INR 1.7 Today take 1.5 pills then change dose to 1 pill everyday except 1/2 pill on Tuesdays and Thursdays. Recheck in 12 days   Current Anticoagulation Instructions: INR 2.4 (INR goal: 2-3)  Take 1 tablet everyday except 1/2 tablet on Tuesdays and Thursdays.  Recheck in 3 weeks

## 2011-01-06 NOTE — Medication Information (Signed)
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 yes       Details: Crestor stopped 1 mth ago.   Recent/future dental: no  Any missed doses?: no       Is patient compliant with meds? yes       Allergies: 1)  ! Biaxin  Anticoagulation Management History:      The patient is taking warfarin and comes in today for a routine follow up visit.  Positive risk factors for bleeding include an age of 72 years or older and history of CVA/TIA.  The bleeding index is 'intermediate risk'.  Positive CHADS2 values include History of HTN and Prior Stroke/CVA/TIA.  Negative CHADS2 values include Age > 72 years old.  The start date was 12/12/2002.  Her last INR was 1.7.  Anticoagulation responsible provider: Riley Kill MD, Maisie Fus.  INR POC: 1.7.  Cuvette Lot#: [id number].  Exp: 01/2012.    Anticoagulation Management Assessment/Plan:      The patient's current anticoagulation dose is Coumadin 5 mg tabs: Take as directed by coumadin clinic..  The target INR is 2 - 3.  The next INR is due 12/13/2010.  Anticoagulation instructions were given to patient.  Results were reviewed/authorized by Bethena Midget, RN, BSN.  She was notified by Bethena Midget, RN, BSN.         Prior Anticoagulation Instructions: INR 1.0  Take 1 1/2 tablets today and tomorrow then resume same dose of 1 tablet every day except 1/2 tablet on Sunday, Tuesday and Thursday.  Recheck INR in 1 week.   Current Anticoagulation Instructions: INR 1.7 Today take 1.5 pills then change dose to 1 pill everyday except 1/2 pill on Tuesdays and Thursdays. Recheck in 12 days

## 2011-01-06 NOTE — Medication Information (Signed)
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 no    Recent/future dental: no  Any missed doses?: no       Is patient compliant with meds? yes       Allergies: 1)  ! Biaxin  Anticoagulation Management History:      The patient is taking warfarin and comes in today for a routine follow up visit.  Positive risk factors for bleeding include an age of 70 years or older and history of CVA/TIA.  The bleeding index is 'intermediate risk'.  Positive CHADS2 values include History of HTN and Prior Stroke/CVA/TIA.  Negative CHADS2 values include Age > 17 years old.  The start date was 12/12/2002.  Her last INR was 1.7.  Anticoagulation responsible provider: Shalimar Mcclain MD, Fayrene Fearing.  INR POC: 2.4.  Cuvette Lot#: [id number].  Exp: 07/2011.    Anticoagulation Management Assessment/Plan:      The patient's current anticoagulation dose is Coumadin 5 mg tabs: Take as directed by coumadin clinic..  The target INR is 2 - 3.  The next INR is due 05/13/2010.  Anticoagulation instructions were given to patient.  Results were reviewed/authorized by Weston Brass, PharmD.  She was notified by Weston Brass PharmD.         Prior Anticoagulation Instructions: INR=2.3  will continue with present dosing schedule--1/2 tablet( 5mg ) sunday,tuesday,thursday--1 whole tablet mon,wed,fri,saturday  Current Anticoagulation Instructions: INR 2.4  Continue same dose of 1 tablet every day except 1/2 tablet on Sunday, Tuesday and Thursday

## 2011-01-06 NOTE — Assessment & Plan Note (Signed)
Summary: YEARLY---STC   Vital Signs:  Patient profile:   70 year old female Height:      63 inches Weight:      152.50 pounds BMI:     27.11 O2 Sat:      94 % on Room air Temp:     97 degrees F oral Pulse rate:   69 / minute BP sitting:   118 / 68  (left arm) Cuff size:   regular  Vitals Entered By: Zella Ball Ewing CMA Duncan Dull) (August 04, 2010 11:05 AM)  O2 Flow:  Room air  Preventive Care Screening  Bone Density:    Date:  09/15/2009    Next Due:  09/2012    Results:  normal std dev  Last Flu Shot:    Date:  08/04/2010    Results:  Fluvax 3+  Pap Smear:    Date:  08/04/2009    Results:  normal   Mammogram:    Date:  08/04/2009    Results:  normal      for pap and mamogram scheudled for sept 2011  CC: yearly/RE   Primary Care Provider:  Oliver Barre  CC:  yearly/RE.  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    Preventive Screening-Counseling & Management      Drug Use:  no.    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 1-2 Puffs Every 4-6 Hours As Needed 2)  Omeprazole 20 Mg Cpdr (Omeprazole) .... Take 2 Capsule By Mouth Once A Day 3)  Exforge 5-160 Mg  Tabs (Amlodipine Besylate-Valsartan) .Marland Kitchen.. 1 By Mouth Once Daily 4)  Celebrex 100 Mg  Caps (Celecoxib) .Marland Kitchen.. 1 By Mouth Once Daily Prn 5)  Estrogel 0.75 Mg/1.25 Gm (0.06%)  Gel (Estradiol) .... Use Asd 6)  Crestor 20 Mg  Tabs (Rosuvastatin Calcium) .Marland Kitchen.. 1 By Mouth Once Daily 7)  Spiriva Handihaler 18 Mcg  Caps (Tiotropium Bromide Monohydrate) .... Two Puffs in Handihaler Daily 8)  Astelin 137 Mcg/spray Soln (Azelastine Hcl) .Marland Kitchen.. 1 Spray in Eash Nostril Two Times A Day As Needed Alternate With Singulair 9)  Fluocinonide 0.05 % Crea (Fluocinonide) .... Use Asd Two Times A Day As Needed 10)  Coumadin 5 Mg Tabs (Warfarin Sodium) .... Take As Directed By Coumadin Clinic. 11)  Transderm-Scop 1.5 Mg Pt72 (Scopolamine Base) .... Use Asd 1 Patch Every 3 Days 12)  Tussionex Pennkinetic Er 8-10 Mg/58ml Lqcr (Chlorpheniramine-Hydrocodone) .... 5 Mls By Mouth Two Times A Day As Needed 13)  Singulair 10 Mg Tabs (Montelukast Sodium) .Marland Kitchen.. 1 By Mouth Once Daily As Needed  Current Medications (verified): 1)  Proair Hfa 108 (90 Base) Mcg/act  Aers (Albuterol Sulfate) .Marland Kitchen.. 1-2 Puffs Every 4-6 Hours As Needed 2)  Omeprazole 20 Mg Cpdr (Omeprazole) .... Take 2 Capsule By Mouth Once A Day 3)  Exforge 5-160 Mg  Tabs (Amlodipine Besylate-Valsartan) .Marland Kitchen.. 1 By Mouth Once Daily 4)  Celebrex 100 Mg  Caps (Celecoxib) .Marland Kitchen.. 1 By Mouth Once Daily Prn 5)  Estrogel 0.75 Mg/1.25 Gm (0.06%)  Gel (Estradiol) .... Use Asd 6)  Crestor 20 Mg  Tabs (Rosuvastatin Calcium) .Marland Kitchen.. 1 By Mouth Once Daily 7)  Spiriva Handihaler 18 Mcg  Caps (Tiotropium  Bromide Monohydrate) .... Two Puffs in Handihaler Daily 8)  Astelin 137 Mcg/spray Soln (Azelastine Hcl) .Marland Kitchen.. 1 Spray in Eash Nostril Two Times A Day As Needed Alternate With Singulair 9)  Fluocinonide 0.05 % Crea (Fluocinonide) .... Use Asd Two Times A Day As Needed 10)  Coumadin 5 Mg Tabs (Warfarin Sodium) .... Take As Directed By Coumadin Clinic. 11)  Transderm-Scop 1.5 Mg Pt72 (Scopolamine Base) .... Use Asd 1 Patch Every 3 Days 12)  Tussionex Pennkinetic Er 8-10 Mg/18ml Lqcr (Chlorpheniramine-Hydrocodone) .... 5 Mls By Mouth Two Times A Day As Needed 13)  Singulair 10 Mg Tabs (Montelukast Sodium) .Marland Kitchen.. 1 By Mouth Once Daily As Needed  Allergies (verified): 1)  ! 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         all otherwise negative per pt -    Physical Exam  General:  alert and overweight-appearing.   Head:  normocephalic and atraumatic.   Eyes:  vision grossly intact, pupils equal, and pupils round.   Ears:  R ear normal and L ear normal.   Nose:  no external deformity and no nasal discharge.   Mouth:  no gingival abnormalities and pharynx pink and moist.   Neck:  supple and no masses.   Lungs:  normal respiratory effort and normal breath sounds.   Heart:  normal rate and regular rhythm.   Abdomen:  soft, non-tender, and normal bowel sounds.   Msk:  no joint tenderness and no joint swelling.   Extremities:  no edema, no erythema  Neurologic:  cranial nerves II-XII intact and strength  normal in all extremities.   Skin:  color normal and no rashes.   Psych:  not depressed appearing and slightly anxious.     Impression & Recommendations:  Problem # 1:  Preventive Health Care (ICD-V70.0)  Overall doing well, age appropriate education and counseling updated and referral for appropriate preventive services done unless declined, immunizations up to date or declined, diet counseling done if overweight, urged to quit smoking if smokes , most recent labs reviewed and current ordered if appropriate, ecg reviewed or declined (interpretation per ECG scanned in the EMR if done); information regarding Medicare Prevention requirements given if appropriate; speciality referrals updated as appropriate   Orders: TLB-BMP (Basic Metabolic Panel-BMET) (80048-METABOL) TLB-CBC Platelet - w/Differential (85025-CBCD) TLB-Hepatic/Liver Function Pnl (80076-HEPATIC) TLB-Lipid Panel (80061-LIPID) TLB-TSH (Thyroid Stimulating Hormone) (84443-TSH) TLB-Udip ONLY (81003-UDIP)  Problem # 2:  HYPERTENSION (ICD-401.9)  Her updated medication list for this problem includes:    Exforge  5-160 Mg Tabs (Amlodipine besylate-valsartan) .Marland Kitchen... 1 by mouth once daily  BP today: 118/68 Prior BP: 128/70 (07/23/2009)  Labs Reviewed: K+: 4.1 (07/23/2009) Creat: : 0.9 (07/23/2009)   Chol: 173 (07/23/2009)   HDL: 49.90 (07/23/2009)   LDL: 87 (07/23/2009)   TG: 181.0 (07/23/2009) stable overall by hx and exam, ok to continue meds/tx as is   Complete Medication List: 1)  Proair Hfa 108 (90 Base) Mcg/act Aers (Albuterol sulfate) .Marland Kitchen.. 1-2 puffs every 4-6 hours as needed 2)  Omeprazole 20 Mg Cpdr (Omeprazole) .... Take 2 capsule by mouth once a day 3)  Exforge 5-160 Mg Tabs (Amlodipine besylate-valsartan) .Marland Kitchen.. 1 by mouth once daily 4)  Celebrex 100 Mg Caps (Celecoxib) .Marland Kitchen.. 1 by mouth once daily prn 5)  Estrogel 0.75 Mg/1.25 Gm (0.06%) Gel (Estradiol) .... Use asd 6)  Crestor 20 Mg Tabs (Rosuvastatin calcium) .Marland Kitchen.. 1 by mouth once daily 7)  Spiriva Handihaler 18 Mcg Caps (Tiotropium bromide monohydrate) .... Two puffs in handihaler daily 8)  Astelin 137 Mcg/spray Soln (Azelastine hcl) .Marland Kitchen.. 1 spray in eash nostril two times a day as needed alternate with singulair 9)  Fluocinonide 0.05 % Crea (Fluocinonide) .... Use asd two times a day as needed 10)  Coumadin 5 Mg Tabs (Warfarin sodium) .... Take as directed by coumadin clinic. 11)  Transderm-scop 1.5 Mg Pt72 (Scopolamine base) .... Use asd 1 patch every 3 days 12)  Tussionex Pennkinetic Er 8-10 Mg/51ml Lqcr (Chlorpheniramine-hydrocodone) .... 5 mls by mouth two times a day as needed 13)  Singulair 10 Mg Tabs (Montelukast sodium) .Marland Kitchen.. 1 by mouth once daily as needed  Other Orders: Admin 1st Vaccine (04540) Flu Vaccine 67yrs + [id number])  Patient Instructions: 1)  you had the flu shot today 2)  Continue all previous medications as before this visit  3)  you are up to date today with all prevention measures 4)  Please go to the Lab in the basement for your blood and/or urine tests today 5)  Please call the number on the [facility] Card for results  of your testing  6)  Please schedule a follow-up appointment in 1 year or sooner if needed Prescriptions: SPIRIVA HANDIHALER 18 MCG  CAPS (TIOTROPIUM BROMIDE MONOHYDRATE) Two puffs in handihaler daily  #90 x 3   Entered and Authorized by:   Corwin Levins MD   Signed by:   Corwin Levins MD on 08/04/2010   Method used:   Print then Give to Patient   RxID:   [id number] SINGULAIR 10 MG TABS (MONTELUKAST SODIUM) 1 by mouth  once daily as needed  #90 x 3   Entered and Authorized by:   Corwin Levins MD   Signed by:   Corwin Levins MD on 08/04/2010   Method used:   Print then Give to Patient   RxID:   [id number] ASTELIN 137 MCG/SPRAY SOLN (AZELASTINE HCL) 1 spray in eash nostril two times a day as needed alternate with singulair  #3 x 3   Entered and Authorized by:   Corwin Levins MD   Signed by:   Corwin Levins MD on 08/04/2010   Method used:   Print then Give to Patient   RxID:   [id number] CRESTOR 20 MG  TABS (ROSUVASTATIN CALCIUM) 1 by mouth once daily  #90 x 3   Entered and Authorized by:   Corwin Levins MD   Signed by:   Corwin Levins MD on 08/04/2010   Method used:   Print then Give to Patient   RxID:   [id number] EXFORGE 5-160 MG  TABS (AMLODIPINE BESYLATE-VALSARTAN) 1 by mouth once daily  #90 x 3   Entered and Authorized by:   Corwin Levins MD   Signed by:   Corwin Levins MD on 08/04/2010   Method used:   Print then Give to Patient   RxID:   [id number]     Flu Vaccine Consent Questions     Do you have a history of severe allergic reactions to this vaccine? no    Any prior history of allergic reactions to egg and/or gelatin? no    Do you have a sensitivity to the preservative Thimersol? no    Do you have a past history of Guillan-Barre Syndrome? no    Do you currently have an acute febrile illness? no    Have you ever had a severe reaction to latex? no    Vaccine information given and explained to patient? yes    Are you currently pregnant? no    Lot  Number:[id number]   Exp Date:06/04/2011   Site Given  Left Deltoid IMlu

## 2011-01-06 NOTE — Medication Information (Signed)
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   Health status changes: no    Bleeding/hemorrhagic complications: no    Recent/future hospitalizations: no    Any changes in medication regimen? no    Recent/future dental: no  Any missed doses?: no       Is patient compliant with meds? yes       Allergies: 1)  ! Biaxin  Anticoagulation Management History:      The patient is taking warfarin and comes in today for a routine follow up visit.  Positive risk factors for bleeding include an age of 72 years or older and history of CVA/TIA.  The bleeding index is 'intermediate risk'.  Positive CHADS2 values include History of HTN and Prior Stroke/CVA/TIA.  Negative CHADS2 values include Age > 72 years old.  The start date was 12/12/2002.  Her last INR was 1.7.  Anticoagulation responsible provider: Clifton James MD, Cristal Deer.  INR POC: 2.4.  Cuvette Lot#: [id number].  Exp: 09/2011.    Anticoagulation Management Assessment/Plan:      The patient's current anticoagulation dose is Coumadin 5 mg tabs: Take as directed by coumadin clinic..  The target INR is 2 - 3.  The next INR is due 08/04/2010.  Anticoagulation instructions were given to patient.  Results were reviewed/authorized by Cloyde Reams, RN, BSN.  She was notified by Cloyde Reams RN.         Prior Anticoagulation Instructions: INR 2.0  Take 1 tablet today, then resume same doage 1 tablet daily except 1/2 tablet on Sundays, Tuesdays, and Thursdays.  Recheck in 4 weeks.   Current Anticoagulation Instructions: INR 2.4  Continue on same dosage 1 tablet daily except 1/2 tablet on Sundays, Tuesdays, and Thursdays.  Recheck in 4 weeks.

## 2011-01-06 NOTE — Medication Information (Signed)
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 yes       Details: on cephalexin x 10 day- started 9/24  Recent/future dental: no  Any missed doses?: yes     Details: was late taking several doses but never missed complete dose    Allergies: 1)  ! Biaxin  Anticoagulation Management History:      The patient is taking warfarin and comes in today for a routine follow up visit.  Positive risk factors for bleeding include an age of 70 years or older and history of CVA/TIA.  The bleeding index is 'intermediate risk'.  Positive CHADS2 values include History of HTN and Prior Stroke/CVA/TIA.  Negative CHADS2 values include Age > 36 years old.  The start date was 12/12/2002.  Her last INR was 1.7.  Anticoagulation responsible provider: Ladona Ridgel MD, Sharlot Gowda.  INR POC: 2.2.  Cuvette Lot#: [id number].  Exp: 10/2011.    Anticoagulation Management Assessment/Plan:      The patient's current anticoagulation dose is Coumadin 5 mg tabs: Take as directed by coumadin clinic..  The target INR is 2 - 3.  The next INR is due 09/29/2010.  Anticoagulation instructions were given to patient.  Results were reviewed/authorized by Weston Brass, PharmD.  She was notified by Weston Brass PharmD.         Prior Anticoagulation Instructions: INR 2.5  Continue taking 1 tablet (5mg ) every day except take 1/2 tablet (2.5mg ) on Sundays, Tuesdays, and Thursdays.  Recheck in 4 weeks.   Current Anticoagulation Instructions: INR 2.2  Continue same dose of 1 tablet every day except 1/2 tablet on Sunday, Tuesday and Thursday.  Recheck INR in 4 weeks.

## 2011-01-06 NOTE — Medication Information (Signed)
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 no    Recent/future dental: no  Any missed doses?: no       Is patient compliant with meds? yes       Allergies (verified): 1)  ! Biaxin  Anticoagulation Management History:      The patient is taking warfarin and comes in today for a routine follow up visit.  Positive risk factors for bleeding include an age of 33 years or older and history of CVA/TIA.  The bleeding index is 'intermediate risk'.  Positive CHADS2 values include History of HTN and Prior Stroke/CVA/TIA.  Negative CHADS2 values include Age > 42 years old.  The start date was 12/12/2002.  Her last INR was 1.7.  Anticoagulation responsible provider: Ladona Ridgel MD, Sharlot Gowda.  INR POC: 2.2.  Cuvette Lot#: [id number].  Exp: 04/2011.    Anticoagulation Management Assessment/Plan:      The patient's current anticoagulation dose is Coumadin 5 mg tabs: Take as directed by coumadin clinic..  The target INR is 2 - 3.  The next INR is due 03/18/2010.  Anticoagulation instructions were given to patient.  Results were reviewed/authorized by Cloyde Reams, RN, BSN.  She was notified by Cloyde Reams RN.         Prior Anticoagulation Instructions: INR 2.2 Continue 5mg s daily except 2.5mg s on Tuesdays, Thursdays and Sundays. Recheck in 4 weeks.   Current Anticoagulation Instructions: INR 2.2  Continue on same dosage 1 tablet daily except 1/2 tablet on Sundays, Tuesdays, and Thursdays.  Recheck in 4 weeks.

## 2011-01-06 NOTE — Medication Information (Signed)
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 no    Recent/future dental: no  Any missed doses?: no       Is patient compliant with meds? yes       Allergies: 1)  ! Biaxin  Anticoagulation Management History:      The patient is taking warfarin and comes in today for a routine follow up visit.  Positive risk factors for bleeding include an age of 43 years or older and history of CVA/TIA.  The bleeding index is 'intermediate risk'.  Positive CHADS2 values include History of HTN and Prior Stroke/CVA/TIA.  Negative CHADS2 values include Age > 45 years old.  The start date was 12/12/2002.  Her last INR was 1.7.  Anticoagulation responsible provider: Tenny Craw MD, Gunnar Fusi.  INR POC: 2.4.  Cuvette Lot#: [id number].  Exp: 07/2011.    Anticoagulation Management Assessment/Plan:      The patient's current anticoagulation dose is Coumadin 5 mg tabs: Take as directed by coumadin clinic..  The target INR is 2 - 3.  The next INR is due 06/10/2010.  Anticoagulation instructions were given to patient.  Results were reviewed/authorized by Elaina Pattee, PharmD.  She was notified by Elaina Pattee, PharmD.         Prior Anticoagulation Instructions: INR 2.4  Continue same dose of 1 tablet every day except 1/2 tablet on Sunday, Tuesday and Thursday   Current Anticoagulation Instructions: INR 2.4. Take 1 tablet daily except 0.5 tablet on Tues, Thurs, Sun.  Recheck in 4 weeks.

## 2011-01-06 NOTE — Medication Information (Signed)
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 yes       Details: Took Keflex TID for 10 days, Then Augmentin BID for 10 days. Also took PRN Tussinex  Recent/future dental: no  Any missed doses?: no       Is patient compliant with meds? yes       Allergies: 1)  ! Biaxin  Anticoagulation Management History:      The patient is taking warfarin and comes in today for a routine follow up visit.  Positive risk factors for bleeding include an age of 72 years or older and history of CVA/TIA.  The bleeding index is 'intermediate risk'.  Positive CHADS2 values include History of HTN and Prior Stroke/CVA/TIA.  Negative CHADS2 values include Age > 55 years old.  The start date was 12/12/2002.  Her last INR was 1.7.  Anticoagulation responsible provider: Myrtis Ser MD, Tinnie Gens.  INR POC: 2.2.  Cuvette Lot#: [id number].  Exp: 03/2011.    Anticoagulation Management Assessment/Plan:      The patient's current anticoagulation dose is Coumadin 5 mg tabs: Take as directed by coumadin clinic..  The target INR is 2 - 3.  The next INR is due 02/18/2010.  Anticoagulation instructions were given to patient.  Results were reviewed/authorized by Bethena Midget, RN, BSN.  She was notified by Bethena Midget, RN, BSN.         Prior Anticoagulation Instructions: INR 2.2 Continue 5mg s everyday except 2.5mg s on Tuesdays, Thursdays, and Sundays. Recheck in 4 weeks.   Current Anticoagulation Instructions: INR 2.2 Continue 5mg s daily except 2.5mg s on Tuesdays, Thursdays and Sundays. Recheck in 4 weeks.

## 2011-01-06 NOTE — Medication Information (Signed)
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 no    Recent/future dental: no  Any missed doses?: no       Is patient compliant with meds? yes       Allergies: 1)  ! Biaxin  Anticoagulation Management History:      The patient is taking warfarin and comes in today for a routine follow up visit.  Positive risk factors for bleeding include an age of 72 years or older and history of CVA/TIA.  The bleeding index is 'intermediate risk'.  Positive CHADS2 values include History of HTN and Prior Stroke/CVA/TIA.  Negative CHADS2 values include Age > 54 years old.  The start date was 12/12/2002.  Her last INR was 1.7.  Anticoagulation responsible provider: Riley Kill MD, Maisie Fus.  INR POC: 2.2.  Cuvette Lot#: [id number].  Exp: 01/2011.    Anticoagulation Management Assessment/Plan:      The patient's current anticoagulation dose is Coumadin 5 mg tabs: Take as directed by coumadin clinic..  The target INR is 2 - 3.  The next INR is due 01/21/2010.  Anticoagulation instructions were given to patient.  Results were reviewed/authorized by Bethena Midget, RN, BSN.  She was notified by Bethena Midget, RN, BSN.         Prior Anticoagulation Instructions: INR 2.0   Start taking 1 tablet daily except 1/2 tablet on Sundays, Tuesdays, and Fridays.  Recheck in 4 weeks.    Current Anticoagulation Instructions: INR 2.2 Continue 5mg s everyday except 2.5mg s on Tuesdays, Thursdays, and Sundays. Recheck in 4 weeks.

## 2011-01-06 NOTE — Medication Information (Signed)
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 no    Recent/future dental: no  Any missed doses?: no       Is patient compliant with meds? yes       Allergies: 1)  ! Biaxin  Anticoagulation Management History:      The patient is taking warfarin and comes in today for a routine follow up visit.  Positive risk factors for bleeding include an age of 72 years or older and history of CVA/TIA.  The bleeding index is 'intermediate risk'.  Positive CHADS2 values include History of HTN and Prior Stroke/CVA/TIA.  Negative CHADS2 values include Age > 17 years old.  The start date was 12/12/2002.  Her last INR was 1.7.  Anticoagulation responsible provider: Ladona Ridgel MD, Sharlot Gowda.  INR POC: 2.0.  Cuvette Lot#: [id number].  Exp: 08/2011.    Anticoagulation Management Assessment/Plan:      The patient's current anticoagulation dose is Coumadin 5 mg tabs: Take as directed by coumadin clinic..  The target INR is 2 - 3.  The next INR is due 07/08/2010.  Anticoagulation instructions were given to patient.  Results were reviewed/authorized by Cloyde Reams, RN, BSN.  She was notified by Cloyde Reams RN.         Prior Anticoagulation Instructions: INR 2.4. Take 1 tablet daily except 0.5 tablet on Tues, Thurs, Sun.  Recheck in 4 weeks.  Current Anticoagulation Instructions: INR 2.0  Take 1 tablet today, then resume same doage 1 tablet daily except 1/2 tablet on Sundays, Tuesdays, and Thursdays.  Recheck in 4 weeks.

## 2011-01-06 NOTE — Progress Notes (Signed)
Summary: FYI-Singulair  Phone Note Call from Patient   Summary of Call: FYI--Patient called to notify our office that the singulair prescription is being sent to Dr. Jonny Ruiz b/c she does not see Dr. Delford Field unless it is an emergency. Initial call taken by: Lucious Groves,  February 11, 2010 10:02 AM

## 2011-01-06 NOTE — Medication Information (Signed)
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   Health status changes: no     Recent/future hospitalizations: no    Any changes in medication regimen? yes       Details: Stopped crestor about 2 weeks ago.    Recent/future dental: no  Any missed doses?: no       Is patient compliant with meds? yes       Allergies: 1)  ! Biaxin  Anticoagulation Management History:      The patient is taking warfarin and comes in today for a routine follow up visit.  Positive risk factors for bleeding include an age of 3 years or older and history of CVA/TIA.  The bleeding index is 'intermediate risk'.  Positive CHADS2 values include History of HTN and Prior Stroke/CVA/TIA.  Negative CHADS2 values include Age > 66 years old.  The start date was 12/12/2002.  Her last INR was 1.7.  Anticoagulation responsible provider: Ladona Ridgel MD, Sharlot Gowda.  INR POC: 1.9.  Cuvette Lot#: [id number].  Exp: 11/2011.    Anticoagulation Management Assessment/Plan:      The patient's current anticoagulation dose is Coumadin 5 mg tabs: Take as directed by coumadin clinic..  The target INR is 2 - 3.  The next INR is due 10/26/2010.  Anticoagulation instructions were given to patient.  Results were reviewed/authorized by Weston Brass, PharmD.  She was notified by Haynes Hoehn, PharmD Candidate.         Prior Anticoagulation Instructions: INR 2.2  Continue same dose of 1 tablet every day except 1/2 tablet on Sunday, Tuesday and Thursday.  Recheck INR in 4 weeks.   Current Anticoagulation Instructions: INR 1.9  Take 1.5 tablets tonight, then resume normal Coumadin schedule: 1 tablet every day except 0.5 on Sunday, Tuesday, and Thursday.   Return in 4 weeks.

## 2011-01-06 NOTE — Medication Information (Signed)
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 no    Recent/future dental: no  Any missed doses?: no       Is patient compliant with meds? yes       Current Medications (verified): 1)  Proair Hfa 108 (90 Base) Mcg/act  Aers (Albuterol Sulfate) .Marland Kitchen.. 1-2 Puffs Every 4-6 Hours As Needed 2)  Omeprazole 20 Mg Cpdr (Omeprazole) .... Take 2 Capsule By Mouth Once A Day 3)  Exforge 5-160 Mg  Tabs (Amlodipine Besylate-Valsartan) .Marland Kitchen.. 1 By Mouth Once Daily 4)  Celebrex 100 Mg  Caps (Celecoxib) .Marland Kitchen.. 1 By Mouth Once Daily Prn 5)  Estrogel 0.75 Mg/1.25 Gm (0.06%)  Gel (Estradiol) .... Use Asd 6)  Crestor 20 Mg  Tabs (Rosuvastatin Calcium) .Marland Kitchen.. 1 By Mouth Once Daily 7)  Spiriva Handihaler 18 Mcg  Caps (Tiotropium Bromide Monohydrate) .... Two Puffs in Handihaler Daily 8)  Astelin 137 Mcg/spray Soln (Azelastine Hcl) .Marland Kitchen.. 1 Spray in [place]ash Nostril Two Times A Day As Needed Alternate With Singulair 9)  Fluocinonide 0.05 % Crea (Fluocinonide) .... Use Asd Two Times A Day As Needed 10)  Coumadin 5 Mg Tabs (Warfarin Sodium) .... Take As Directed By Coumadin Clinic. 11)  Transderm-Scop 1.5 Mg Pt72 (Scopolamine Base) .... Use Asd 1 Patch Every 3 Days 12)  Tussionex Pennkinetic Er 8-10 Mg/2ml Lqcr (Chlorpheniramine-Hydrocodone) .... 5 Mls By Mouth Two Times A Day As Needed 13)  Singulair 10 Mg Tabs (Montelukast Sodium) .Marland Kitchen.. 1 By Mouth Once Daily As Needed  Allergies (verified): 1)  ! Biaxin  Anticoagulation Management History:      The patient is taking warfarin and comes in today for a routine follow up visit.  Positive  risk factors for bleeding include an age of 40 years or older and history of CVA/TIA.  The bleeding index is 'intermediate risk'.  Positive CHADS2 values include History of HTN and Prior Stroke/CVA/TIA.  Negative CHADS2 values include Age > 50 years old.  The start date was 12/12/2002.  Her last INR was 1.7.  Anticoagulation responsible provider: Aislee Landgren MD, Reuel Boom.  INR POC: 2.3.  Cuvette Lot#: [id number].  Exp: 04/2011.    Anticoagulation Management Assessment/Plan:      The patient's current anticoagulation dose is Coumadin 5 mg tabs: Take as directed by coumadin clinic..  The target INR is 2 - 3.  The next INR is due 04/15/2010.  Anticoagulation instructions were given to patient.  Results were reviewed/authorized by Cloyde Reams, RN, BSN.  She was notified by nterbeck.         Prior Anticoagulation Instructions: INR 2.2  Continue on same dosage 1 tablet daily except 1/2 tablet on Sundays, Tuesdays, and Thursdays.  Recheck in 4 weeks.    Current Anticoagulation Instructions: INR=2.3  will continue with present dosing schedule--1/2 tablet( 5mg ) sunday,tuesday,thursday--1 whole tablet mon,wed,fri,saturday

## 2011-01-06 NOTE — Medication Information (Signed)
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 no    Recent/future dental: no  Any missed doses?: no       Is patient compliant with meds? yes       Allergies: 1)  ! Biaxin  Anticoagulation Management History:      The patient is taking warfarin and comes in today for a routine follow up visit.  Positive risk factors for bleeding include an age of 2 years or older and history of CVA/TIA.  The bleeding index is 'intermediate risk'.  Positive CHADS2 values include History of HTN and Prior Stroke/CVA/TIA.  Negative CHADS2 values include Age > 6 years old.  The start date was 12/12/2002.  Her last INR was 1.7.  Anticoagulation responsible provider: Daleen Squibb MD, Maisie Fus.  INR POC: 1.0.  Cuvette Lot#: [id number].  Exp: 01/2012.    Anticoagulation Management Assessment/Plan:      The patient's current anticoagulation dose is Coumadin 5 mg tabs: Take as directed by coumadin clinic..  The target INR is 2 - 3.  The next INR is due 12/01/2010.  Anticoagulation instructions were given to patient.  Results were reviewed/authorized by Weston Brass, PharmD.  She was notified by Weston Brass PharmD.         Prior Anticoagulation Instructions: INR 2.3 Continue 5mg s everyday except 2.5mg s on Sundays, Tuesdays and Thursdays. Recheck in 4 weeks.   Current Anticoagulation Instructions: INR 1.0  Take 1 1/2 tablets today and tomorrow then resume same dose of 1 tablet every day except 1/2 tablet on Sunday, Tuesday and Thursday.  Recheck INR in 1 week.

## 2011-01-06 NOTE — Medication Information (Signed)
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 no    Recent/future dental: no  Any missed doses?: no       Is patient compliant with meds? yes       Allergies: 1)  ! Biaxin  Anticoagulation Management History:      The patient is taking warfarin and comes in today for a routine follow up visit.  Positive risk factors for bleeding include an age of 72 years or older and history of CVA/TIA.  The bleeding index is 'intermediate risk'.  Positive CHADS2 values include History of HTN and Prior Stroke/CVA/TIA.  Negative CHADS2 values include Age > 28 years old.  The start date was 12/12/2002.  Her last INR was 1.7.  Anticoagulation responsible provider: Clifton James MD, Cristal Deer.  INR POC: 2.3.  Cuvette Lot#: [id number].  Exp: 11/2011.    Anticoagulation Management Assessment/Plan:      The patient's current anticoagulation dose is Coumadin 5 mg tabs: Take as directed by coumadin clinic..  The target INR is 2 - 3.  The next INR is due 11/23/2010.  Anticoagulation instructions were given to patient.  Results were reviewed/authorized by Bethena Midget, RN, BSN.  She was notified by Bethena Midget, RN, BSN.         Prior Anticoagulation Instructions: INR 1.9  Take 1.5 tablets tonight, then resume normal Coumadin schedule: 1 tablet every day except 0.5 on Sunday, Tuesday, and Thursday.  Return in 4 weeks.    Current Anticoagulation Instructions: INR 2.3 Continue 5mg s everyday except 2.5mg s on Sundays, Tuesdays and Thursdays. Recheck in 4 weeks.

## 2011-01-06 NOTE — Medication Information (Signed)
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 no    Recent/future dental: no  Any missed doses?: yes     Details: Missed Saturday's dose.  Took it early the next morning.  Took "Sunday;s schduled dose that night.   Is patient compliant with meds? yes       Allergies: 1)  ! Biaxin  Anticoagulation Management History:      The patient is taking warfarin and comes in today for a routine follow up visit.  Positive risk factors for bleeding include an age of 72 years or older and history of CVA/TIA.  The bleeding index is 'intermediate risk'.  Positive CHADS2 values include History of HTN and Prior Stroke/CVA/TIA.  Negative CHADS2 values include Age > 75 years old.  The start date was 12/12/2002.  Her last INR was 1.7.  Anticoagulation responsible [place]: Nishan MD, Peter.  INR POC: 2.5.  Cuvette Lot#: [id number].  Exp: 09/2011.    Anticoagulation Management Assessment/Plan:      The patient's current anticoagulation dose is Coumadin 5 mg tabs: Take as directed by coumadin clinic..  The target INR is 2 - 3.  The next INR is due 09/01/2010.  Anticoagulation instructions were given to patient.  Results were reviewed/authorized by Sally Putt, PharmD.  She was notified by Justin Kaat, PharmD Candidate.         Prior Anticoagulation Instructions: INR 2.4  Continue on same dosage 1 tablet daily except 1/2 tablet on Sundays, Tuesdays, and Thursdays.  Recheck in 4 weeks.    Current Anticoagulation Instructions: INR 2.5  Continue taking 1 tablet (5mg) every day except take 1/2 tablet (2.5mg) on Sundays, Tuesdays, and  Thursdays.  Recheck in 4 weeks.  Prescriptions: COUMADIN 5 MG TABS (WARFARIN SODIUM) Take as directed by coumadin clinic.  #90 Tablet x 1   Entered by:   Sally Putt PharmD   Authorized by:   Paula Virginia Ross, MD, FACC   Signed by:   Sally Putt PharmD on 08/04/2010   Method used:   Electronically to        [facility]  [address] [id number]       [address]       [place], [place]  [address]       Ph: [phone] or [phone]       Fax: [phone]   RxID:   [id number]

## 2011-01-12 NOTE — Medication Information (Signed)
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 yes       Details: Started taking red yeast rice and flax seed  Recent/future dental: yes     Details: Adding a crown  Any missed doses?: no       Is patient compliant with meds? yes       Allergies: 1)  ! Biaxin  Anticoagulation Management History:      The patient is taking warfarin and comes in today for a routine follow up visit.  Positive risk factors for bleeding include an age of 72 years or older and history of CVA/TIA.  The bleeding index is 'intermediate risk'.  Positive CHADS2 values include History of HTN and Prior Stroke/CVA/TIA.  Negative CHADS2 values include Age > 72 years old.  The start date was 12/12/2002.  Her last INR was 1.7.  Anticoagulation responsible provider: Myrtis Ser MD, Tinnie Gens.  INR POC: 3.1.  Cuvette Lot#: [id number].  Exp: 01/2012.    Anticoagulation Management Assessment/Plan:      The patient's current anticoagulation dose is Coumadin 5 mg tabs: Take as directed by coumadin clinic..  The target INR is 2 - 3.  The next INR is due 02/01/2011.  Anticoagulation instructions were given to patient.  Results were reviewed/authorized by Weston Brass, PharmD.  She was notified by Linward Headland, PharmD candidate.         Prior Anticoagulation Instructions: INR 2.4 (INR goal: 2-3)  Take 1 tablet everyday except 1/2 tablet on Tuesdays and Thursdays.  Recheck in 3 weeks  Current Anticoagulation Instructions: INR 3.1 (goal INR: 2-3)  Take a 1/2 tablet on Wednesday then resume normal schedule on Thursday of 1 tablet everyday except a 1/2 tablet on  Tuesdays and Thursdays.  Recheck in 4 weeks.    Prescriptions: COUMADIN 5 MG TABS (WARFARIN SODIUM) Take as directed by coumadin clinic.  #90 Tablet x 1   Entered by:   Weston Brass PharmD   Authorized by:   Sherrill Raring, MD, [facility]   Signed by:   Weston Brass PharmD on 01/04/2011   Method used:   [facility] to        [facility]  [address] [phone][facility] (retail[facility]       [address]       [place], [place]  [address]       Ph: [phone] or [phone]       Fax: [phone]   RxID:   [id number]

## 2011-01-31 ENCOUNTER — Encounter: Payer: Self-pay | Admitting: Cardiology

## 2011-01-31 ENCOUNTER — Encounter (INDEPENDENT_AMBULATORY_CARE_PROVIDER_SITE_OTHER): Payer: BC Managed Care – PPO

## 2011-01-31 DIAGNOSIS — Z7901 Long term (current) use of anticoagulants: Secondary | ICD-10-CM

## 2011-01-31 DIAGNOSIS — I6789 Other cerebrovascular disease: Secondary | ICD-10-CM

## 2011-01-31 LAB — CONVERTED CEMR LAB: POC INR: 3.1

## 2011-02-10 NOTE — Medication Information (Signed)
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 no    Recent/future dental: no  Any missed doses?: no       Is patient compliant with meds? yes       Allergies: 1)  ! Biaxin  Anticoagulation Management History:      The patient is taking warfarin and comes in today for a routine follow up visit.  Positive risk factors for bleeding include an age of 72 years or older and history of CVA/TIA.  The bleeding index is 'intermediate risk'.  Positive CHADS2 values include History of HTN and Prior Stroke/CVA/TIA.  Negative CHADS2 values include Age > 66 years old.  The start date was 12/12/2002.  Her last INR was 1.7.  Anticoagulation responsible provider: Jens Som MD, Arlys John.  INR POC: 3.1.  Cuvette Lot#: [id number].  Exp: 12/2011.    Anticoagulation Management Assessment/Plan:      The patient's current anticoagulation dose is Coumadin 5 mg tabs: Take as directed by coumadin clinic..  The target INR is 2 - 3.  The next INR is due 02/28/2011.  Anticoagulation instructions were given to patient.  Results were reviewed/authorized by Windell Hummingbird, RN.  She was notified by Windell Hummingbird, RN.         Prior Anticoagulation Instructions: INR 3.1 (goal INR: 2-3)  Take a 1/2 tablet on Wednesday then resume normal schedule on Thursday of 1 tablet everyday except a 1/2 tablet on Tuesdays and Thursdays.  Recheck in 4 weeks.     Current Anticoagulation Instructions: INR 3.1 Take 1/2 tablet today. Then resume 1 tablet every day, except take 1/2 tablet on Tuesdays and Thursdays. Recheck in  4 weeks.

## 2011-02-28 ENCOUNTER — Ambulatory Visit (INDEPENDENT_AMBULATORY_CARE_PROVIDER_SITE_OTHER): Payer: BC Managed Care – PPO | Admitting: *Deleted

## 2011-02-28 DIAGNOSIS — G459 Transient cerebral ischemic attack, unspecified: Secondary | ICD-10-CM

## 2011-02-28 DIAGNOSIS — Z7901 Long term (current) use of anticoagulants: Secondary | ICD-10-CM

## 2011-02-28 DIAGNOSIS — Z8679 Personal history of other diseases of the circulatory system: Secondary | ICD-10-CM

## 2011-02-28 LAB — POCT INR: INR: 2.9

## 2011-02-28 NOTE — Patient Instructions (Signed)
INR 2.9 Continue 1 pill everyday except 1/2 pill on  Tuesdays and Thursdays. Recheck in 4 weeks.

## 2011-03-28 ENCOUNTER — Ambulatory Visit (INDEPENDENT_AMBULATORY_CARE_PROVIDER_SITE_OTHER): Payer: BC Managed Care – PPO | Admitting: *Deleted

## 2011-03-28 DIAGNOSIS — Z8679 Personal history of other diseases of the circulatory system: Secondary | ICD-10-CM

## 2011-03-28 DIAGNOSIS — Z7901 Long term (current) use of anticoagulants: Secondary | ICD-10-CM

## 2011-03-28 DIAGNOSIS — G459 Transient cerebral ischemic attack, unspecified: Secondary | ICD-10-CM

## 2011-03-28 LAB — POCT INR: INR: 3.4

## 2011-04-11 ENCOUNTER — Ambulatory Visit (INDEPENDENT_AMBULATORY_CARE_PROVIDER_SITE_OTHER): Payer: BC Managed Care – PPO | Admitting: *Deleted

## 2011-04-11 DIAGNOSIS — Z7901 Long term (current) use of anticoagulants: Secondary | ICD-10-CM

## 2011-04-11 DIAGNOSIS — G459 Transient cerebral ischemic attack, unspecified: Secondary | ICD-10-CM

## 2011-04-11 DIAGNOSIS — Z8679 Personal history of other diseases of the circulatory system: Secondary | ICD-10-CM

## 2011-04-11 LAB — POCT INR: INR: 2.4

## 2011-04-19 NOTE — Assessment & Plan Note (Signed)
OFFICE VISIT   [name], [name]  DOB:  28-Apr-1941                                       06/24/2008  [id number]#:[id number]   The patient underwent laser ablation of her left great saphenous vein by  Dr. Arbie Cookey on 06/18/2008 and has had no significant discomfort in the leg  following the procedure.  She denies any edema and has been wearing her  elastic compression stocking as instructed.  She did take ibuprofen as  instructed and discontinued her Celebrex, which she was taking  preoperatively.  She also had Coumadin on hold for a few days prior to  her procedure.  That has now been resumed.  She has had some mild aching  discomfort in the left thigh, but otherwise no symptoms.   PHYSICAL EXAMINATION:  Blood pressure 127/75, heart rate is 73,  respirations 14.  The left leg has some very mild ecchymosis in the  thigh and minimal tenderness along the course of the great saphenous  vein.  The stab entry site in the distal thigh is healing adequately.  There is no distal edema and palpable dorsalis pedis pulses at 2 to 3+.   Venous duplex exam revealed total closure of the left great saphenous  vein from the saphenofemoral junction to the knee and a widely patent  deep system.  She is reassured regarding these findings and will return  on August 12 for her contralateral right leg to be done.  She will  discontinue her Coumadin on August 9 in preparation for this procedure.   [name], M.D.  Electronically Signed   JDL/MEDQ  D:  06/24/2008  T:  06/25/2008  Job:  [id number]

## 2011-04-19 NOTE — Assessment & Plan Note (Signed)
[facility]                               LIPID CLINIC NOTE   PERNELL, DIKES                       MRN:          [id number]  DATE:08/17/2007                            DOB:          07/17/1941    Ms. [name] is seen in the anticoagulation clinic today.  She is  scheduled to have follow-up bladder surgery on August 21, 2007 with  Dr. Sherron Monday.  She is scheduled for 10 in the morning.  She has been  asked to discontinue her warfarin with her last dose on August 15, 2007.  She has had no bleeding or other problems since her last visit.  She did take her last dose of 5 mg on Wednesday, September 10th.  She  took nothing on September 11 and presents today for INR determination.  Ms. [name] PT is 20.8 seconds.  Her INR is 3.  Her weight today is 154  pounds.  She has used Lovenox in the past without issue or problem and  is familiar with injection technique, although we have reviewed it with  her and her husband today in the office.   Based on her dosing weight of 70 kg, she will take nothing today,  August 17, 2007.  She will take nothing, August 18, 2007.  She  will take 1 injection of 70 mg subcutaneously every 12 hours beginning  at 8:00 a.m. on August 19, 2007, continuing with the last dose in the  morning prior to 8:00 on August 20, 2007.  These three Lovenox 80 mg  syringes with instructions, weighs 10 mg, have been called and discussed  with the patient.  The prescription has been called to [facility].  Jonny Ruiz is the pharmacist who has accepted it.  The patient has my pager  number with questions or problems in the meantime.  She will call if she  has trouble with her first injection tomorrow morning or moving forward.      Shelby Dubin, PharmD, BCPS, CPP  Electronically Signed      Pricilla Riffle, MD, [facility]  Electronically Signed   MP/MedQ  DD: 08/17/2007  DT: 08/18/2007  Job #: [id number]   cc:   [facility] short  stay  Martina Sinner, MD

## 2011-04-19 NOTE — Consult Note (Signed)
NEW PATIENT CONSULTATION   Ray, Lauren  DOB:  [birth year]                                       02/20/2008  YQIHK#:[id number]   [name] presents today for evaluation of venous hypertension  bilaterally.  She is a very pleasant 70 year old white female who  reports progressively severe symptoms in both legs, left greater than  right.  She is quite active and walks 4 miles per day and has a great  deal of discomfort over her calves and specifically over the track of  her saphenous vein and her calves bilaterally.  She reports this is  aching discomfort, it is somewhat relieved by elevation.  She does not  have any history of deep venous thrombosis.  She does not have any  history of venous bleeds.   PAST HISTORY:  Significant for:  1. Hypertension.  2. COPD.   She had a transient ischemic attack or a slight stroke in 2002.  Evaluation at that time revealed what sounds like patent foramen ovale  or arrhythmia.  She has been on chronic Coumadin therapy for this.   FAMILY HISTORY:  Negative for premature atherosclerotic disease.   SOCIAL HISTORY:  She is married with 2 children.  She does not smoke,  having quit 13 years ago, and does not drink alcohol on a regular basis.   REVIEW OF SYSTEMS:  Her weight is 116 pounds, height is 5 feet 4 inches  by her report.  She does have difficulty with arthritis for which she  takes Celebrex.  Also has COPD with occasional flare-ups requiring  inhalers.   She has no known drug allergies.   MEDICATIONS:  Warfarin, Crestor, Celebrex, Exforge, omeprazole, EstroGel  pump, albuterol and Advair.   PHYSICAL EXAM:  She is a well-developed, well-nourished white female  appearing stated age 76.  Blood pressure 155/80, pulse 86, respirations  18.  Her radial pulses are 2+.  She has 2+ dorsalis pedis pulses  bilaterally.  She does not have any marked varicosities and only a few  scattered spider veins.  She does have  tenderness over her calves,  specially over the great saphenous vein distribution bilaterally.  She  does have an intense feeling of these as well.  She underwent handheld  venous Duplex by me and this reveals gross reflux of her saphenous veins  bilaterally with enlarged saphenous veins bilaterally.  I discussed this  with [name].  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  I feel that she should expect excellent  relief of these with ablation of her saphenous vein.  She has not tried  any graduated compression stockings recently and we will fit her for  these today.  She is fitted with thigh-high 20 to 30 mmHg stockings and  has been instructed on their use.  I plan to see her again in 3 months  for formal Duplex at that time.  We will discuss other options.  She is  on chronic Celebrex for her back and will continue this for her leg pain  and elevation when possible.  She was instructed to continue her  exercise program, since this will not necessarily improve her venous  hypertension but is important for her otherwise overall health.  I will  see her again in 3 months for a continued discussion.  [name], M.D.  Electronically Signed   TFE/MEDQ  D:  02/20/2008  T:  02/21/2008  Job:  [id number]   cc:   Lauren Levins, MD

## 2011-04-19 NOTE — Procedures (Signed)
LOWER EXTREMITY VENOUS REFLUX EXAM   INDICATION:  Bilateral lower extremity varicose veins.   EXAM:  Using color-flow imaging and pulse Doppler spectral analysis, the  bilateral common femoral, superficial femoral, popliteal, posterior  tibial, greater and lesser saphenous veins are evaluated.  There is  evidence suggesting deep venous insufficiency in the left lower  extremity.  There is no evidence suggesting deep venous insufficiency in  the right lower extremity.   The right saphenofemoral junction is not competent.  The right GSV is  not competent while in supine position and becomes worse while upright  with the caliber as described below.  The left saphenofemoral junction  is competent.  The left GSV is competent in the supine position;  however, is not competent while in the upright position.   The bilateral proximal short saphenous veins demonstrate competency.   GSV Diameter (used if found to be incompetent only)                                            Right    Left  Proximal Greater Saphenous Vein           0.47 cm  cm  Proximal-to-mid-thigh                     0.47 cm  cm  Mid thigh                                 0.48 cm  cm  Mid-distal thigh                          0.48 cm  cm  Distal thigh                              0.42 cm  cm  Knee                                      0.36 cm  cm   IMPRESSION:  1. Right greater saphenous vein reflux is identified with the caliber      ranging from 0.36 cm to 0.47 cm knee to groin.  2. Left greater saphenous vein reflux is identified, as described      above.  3. The bilateral greater saphenous veins are not aneurysmal or      tortuous.  4. The left deep venous system is not competent.  5. The right deep venous system is competent.  6. The bilateral lesser saphenous veins are competent.     ___________________________________________  Larina Earthly, M.D.   CH/MEDQ  D:  05/23/2008  T:  05/23/2008   Job:  [id number]

## 2011-04-19 NOTE — Assessment & Plan Note (Signed)
OFFICE VISIT   [name], [name]  DOB:  Aug 16, 1941                                       06/11/2008  [id number]#:[id number]   The patient presents today for continued followup of her venous  hypertension.  I had seen her on 06/19 and she is underway for  scheduling for her laser ablation of saphenous vein, initially in the  left side since this is most symptomatic and the right side to follow.  I am seeing her today for clarification regarding her duplex.   She underwent repeat duplex by me today and this shows gross reflux  throughout her saphenous vein on both thighs.  We had documented this  with her formal ultrasound study not in supine, but in the upright  position.  Her vein is large bilaterally and with the gross reflux, I  feel that she should have excellent results.  She does have continued,  ongoing problems with tightness and pain in her calf with prolonged  standing.  We will schedule this at her convenience.   Larina Earthly, M.D.  Electronically Signed   TFE/MEDQ  D:  06/11/2008  T:  06/12/2008  Job:  [id number]

## 2011-04-19 NOTE — Op Note (Signed)
[name], [name]                ACCOUNT NO.:  [id number]   MEDICAL RECORD NO.:  [id number]          PATIENT TYPE:  AMB   LOCATION:  DAY                          FACILITY:  [facility]   PHYSICIAN:  Martina Sinner, MD DATE OF BIRTH:  May 05, 1941   DATE OF PROCEDURE:  08/21/2007  DATE OF DISCHARGE:                               OPERATIVE REPORT   PREOPERATIVE DIAGNOSIS:  Cystocele and rectocele.   POSTOPERATIVE DIAGNOSIS:  Cystocele and rectocele.   SURGERY:  Cystocele repair plus four corner graft plus rectocele repair.   [name] has a symptomatic cystocele and rectocele.  She consented to  the above procedure.  She is given preoperative antibiotics.  Her INR  was 1.1.  She had taken Lovenox last night.  Her Coumadin was stopped  several days ago.  She was given preoperative antibiotics.  Her  preoperative labs were normal.  She was prepped and draped in the usual  fashion.  Extra care was taken to with leg positioning to minimize the  risks of compartment syndrome, neuropathy and DVT.   I initially examined her vaginal cuff and marked it with a 3-0 Vicryl  suture.  It descended maybe 4 cm under traction and I did not feel that  she needed a vault suspension.  I placed two Allis clamps near the apex  anteriorly and did a T-shaped anterior vaginal wall incision.  I  dissected the vaginal wall mucosa from the underlying pubocervical  fascia to the white line bilaterally.  I did break through the  pubocervical fascia heading towards the ischial spine bilaterally.  I  did not go all the way back to the spine.  I did a two layer imbricating  anterior repair.  I then scoped the patient.  There was efflux of indigo  carmine from both ureteral orifices.  There was no distortion of the  trigone.   I placed a UR6 0 Vicryl at all four corners near the urethrovesical  angle and proximally through the iliococcygeus muscle, taking nice  bites.  I then tailored an 10 x 6 graft to a probably  7 x 4-5 cm graft.  The dermal graft was sewn into the four corner sutures.  There was no  question the graft went back towards the ischial spine but not all the  way.  I was very pleased with how high the graft went up towards the  white line.  I trimmed 0.5 cm of anterior vaginal wall bilaterally.  I  closed the anterior vaginal wall with running 2-0 Vicryl on a CT1  needle.  The graft did not incorporate the urethra nor did the anterior  repair.   Following the cystocele repair with four corner graft, I did the  posterior repair.  She had a fairly long posterior vaginal wall.  I  placed an Allis on the inferior aspect of the hymenal ring bilaterally.  I removed a small perineal triangle of perineal skin.  I then made a  long midline posterior vaginal wall incision using my usual spreading  technique.  Almost the entire length, the bowel  was sharply adhered to  the thin vaginal wall and is likely due to her previous posterior  repair.  I used the double ring retractor again.  I took down the  rectovaginal fascia towards the sidewall bilaterally.  I was very  pleased on how far I went to the pelvic sidewall bilaterally.  I then  placed a finger in the rectum and she had a diffuse defect throughout  the length of the rectum and an apical defect.  She had reasonable  thickness of her rectovaginal mucosa.   Using a trap door technique, I sutured some rectovaginal fascia at 5 and  7 o'clock to the vaginal apex with 2-0 Vicryl suture.  This helped  flatten the posterior defect.  I then did a two layer imbricating  closure.  It was not under tension.  I was very pleased with the  rectocele repair all the way approaching towards the apex.  She did not  have an enterocele in my opinion.  I trimmed 4-5 mm of posterior vaginal  wall mucosa bilaterally.  I closed the posterior vaginal wall with  running 2-0 Vicryl on a CT1 needle after irrigation.  I did one 0 Vicryl  gentle perineal body  closure.  I used subcuticular closure to close the  skin.   Her anterior and posterior support looked excellent.  Irrigation was  utilized.  I gently inserted a well lubricated vaginal pack with Estrace  cream.  Leg position was good.  I will check her CBC today and tomorrow.  I think I will start her Lovenox the day after tomorrow.  At the end of  the operation, the vaginal size was good.  Vaginal length was excellent.  The Foley catheter was draining blue urine.  The patient was taken to  the recovery room.           ______________________________  Martina Sinner, MD  Electronically Signed     SAM/MEDQ  D:  08/21/2007  T:  08/21/2007  Job:  [id number]

## 2011-04-19 NOTE — Assessment & Plan Note (Signed)
[name]                             PULMONARY OFFICE NOTE   [name], [name]                       MRN:          [id number]  DATE:11/05/2007                            DOB:          23-Sep-1941    CHIEF COMPLAINT:  Evaluate dyspnea.   HISTORY OF PRESENT ILLNESS:  This is a 70 year old female who initially  was given Levaquin for 6 days 500 mg for acute bronchitis in October.  She deferred prednisone and then she became worse overnight. She went to  [facility] where she was admitted for 4 days for treatment of  pneumonia, her symptoms were improved. Sent home on Advair 250/50 one  spray b.i.d. Prior to this, she was only on albuterol p.r.n. She smoked  2 packs a day for 25 years, quit smoking in 1995. Her shortness of  breath is now better, there is no real mucous production. She did have a  flutter valve she is no longer using. She has been on chronic Coumadin  therapy for embolic strokes in the past. She is referred now for further  evaluation.   PAST MEDICAL HISTORY:  History of hypertension, hypercholesterolemia,  embolic strokes. She had a hysterectomy 25 years ago. Hand surgery,  rectocele surgery in September 2008.   MEDICATION ALLERGIES:  BIAXIN.   CURRENT MEDICATIONS:  1. Coumadin daily.  2. Omeprazole 20 mg daily.  3. Celebrex 200 mg daily.  4. Albuterol inhaler p.r.n.  5. Crestor 20 mg daily.  6. Exforge 5/160 daily.  7. Advair 250/50 one spray b.i.d.  8. Singulair 10 mg daily.   SOCIAL HISTORY:  Retired, lives at home with her husband, has children.  Smoking history is noted above.   FAMILY HISTORY:  Positive for emphysema in her mother. Mother had breast  cancer and uterine cancer.   REVIEW OF SYSTEMS:  Otherwise noncontributory.   PHYSICAL EXAMINATION:  GENERAL:  This is a middle-aged female in no  distress.  VITAL SIGNS:  Temperature 97.7, blood pressure 118/80, pulse 99,  saturation 96% on room air.  CHEST:  Showed diminished breath sounds with prolonged respiratory  phase. No wheeze or rhonchi were noted.  CARDIAC:  Showed a regular rate and rhythm without S3, normal S1, S2.  ABDOMEN:  Soft, nontender.  EXTREMITIES:  Showed no edema or clubbing or venous disease.  SKIN:  Clear.  NEUROLOGIC:  Intact.  HEENT:  Showed no jugular venous distention, no lymphadenopathy.  Oropharynx clear.  NECK:  Supple.   LABORATORY DATA:  Spirometry, pulmonary functions were obtained and  showed a total lung capacity of 12%% of predicted, FEV1 of 83% of  predicted, FEC of 142% of predicted, FE of 2575, 12% of predicted,  diffusion capacity at 78% of predicted. Chest x-ray obtained from  [facility] showed COPD changes only.   IMPRESSION:  1. Asthmatic bronchitis.  2. Chronic obstructive lung disease positive response to Advair      therapy.   RECOMMENDATIONS:  Maintain Advair as is, 250/50 one spray b.i.d. No  other pulmonary workup or treatment indicated. Will see the patient back  on an established basis in 3 months.     Charlcie Cradle Delford Field, MD, [facility]  Electronically Signed    PEW/MedQ  DD: 11/05/2007  DT: 11/06/2007  Job #: [id number]   cc:   Corwin Levins, MD

## 2011-04-19 NOTE — Assessment & Plan Note (Signed)
[facility]                            CARDIOLOGY OFFICE NOTE   ILINA, XU                       MRN:          [id number]  DATE:06/29/2007                            DOB:          09/02/1941    IDENTIFICATION:  Ms. Hendrie is a 70 year old woman whom I saw back in  August of last year.  She has a history of a CVA in the past and has  been on chronic Coumadin therapy.  Also a history of hypertension,  dyslipidemia.   Since seen, she has done very well from a cardiac standpoint.  She  remains active.  She walks several miles per day.  She gave up the  elliptical because of her hip giving her problems.  Denies shortness of  breath.  No chest pain.   CURRENT MEDICATIONS:  1. Diovan 160.  2. Amlodipine 5.  3. Omeprazole 20.  4. Vytorin 10/80.  5. Warfarin as directed.  6. Celebrex 100.  7. Estrogel pump.  8. Vitamin E b.i.d.  9. Multivitamin.  10.Calcium with D.   PHYSICAL EXAM:  The patient is in no distress.  Blood pressure 118/70, pulse 74 and regular, weight 155.  LUNGS:  Clear.  NECK:  JVP normal.  No bruits.  CARDIAC:  Regular rate and rhythm.  S1, S2.  No S3, S4, murmurs.  ABDOMEN:  Benign.  EXTREMITIES:  Good pulses.  No edema.   A 12-lead EKG shows normal sinus rhythm at 74 beats per minute.  Possible Q waves in V1 through V3, old.  Occasional PVC.   IMPRESSION:  1. 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  On Coumadin.  Would continue.  Will need      cross-bridging before surgery given event.  2. Hypertension.  Adequate control.  Would continue.  3. Dyslipidemia.  The patient is due to be seen by primary care.  Will      have lipids checked at that time.  4. Urologic.  Due to have a bladder suspension.  Will talk to Dr.      McDiarmid about her cardiac risks.  Overall, I think she is at low      risk for major cardiac event, but will need, of course, cross-  bridging given her central nervous system event.  Can be done      through the Coumadin clinic as it was before.   Tentatively, I will set followup for 1 year, but again I will be in  touch with urology.     Pricilla Riffle, MD, [facility]  Electronically Signed    PVR/MedQ  DD: 06/29/2007  DT: 06/30/2007  Job #: [id number]   cc:   Martina Sinner, MD  Corwin Levins, MD

## 2011-04-19 NOTE — Procedures (Signed)
DUPLEX DEEP VENOUS EXAM - LOWER EXTREMITY   INDICATION:  Follow up bilateral varicose veins with recent left greater  saphenous vein ablation.   HISTORY:  Edema:  Minimal.  Trauma/Surgery:  Status post left greater saphenous vein ablation  06/18/2008 by Dr. Arbie Cookey.  Pain:  Postop, still some soreness per patient.  PE:  No.  Previous DVT:  No.  Anticoagulants:  Warfarin.  Other:   DUPLEX EXAM:                CFV   SFV   PopV  PTV    GSV                R  L  R  L  R  L  R   L  R  L  Thrombosis    0  0     0     0      0     +  Spontaneous   +  +     +     +      +     0  Phasic        +  +     +     +      +     0  Augmentation  +  +     +     +      +     0  Compressible  +  +     +     +      +     0  Competent     +  +     +     +      +     0   Legend:  + - yes  o - no  p - partial  D - decreased   IMPRESSION:  1. No evidence of DVT in left lower extremity or right common femoral      vein.  2. Left greater saphenous vein shows evidence of ablation without flow      from groin to distal insertion site.    _____________________________  Quita Skye. Hart [place], M.D.   AS/MEDQ  D:  06/24/2008  T:  06/24/2008  Job:  [id number]

## 2011-04-19 NOTE — Assessment & Plan Note (Signed)
OFFICE VISIT   [name], BRUNELL  DOB:  03/21/1941                                       05/23/2008  DGUYQ#:[id number]   The patient presents today for 23-month followup for bilateral venous  hypertension.  She has worn graduated her compression garments for  greater than 3 months and reports no relief of her lower extremity  symptoms.  She continues to describe this as a tired, achy sensation  with tenderness in her calves bilaterally with prolonged standing.  She  reports this discomfort does continue to cause trouble with her  employment.  She works at [employer] and stands for prolonged periods of  time.  It is very difficult due to leg pain and tenderness through the  day.  She does exercise regularly and this does diminish her ability for  duration and frequency due to pain.  She underwent a formal duplex today  in our office, and this revealed no evidence of DVT.  She had no deep  system reflux on the right and had very mild reflux on the left.  She  did have saphenous vein reflux, more so on the right than on the left  bilaterally.  I discussed options with the patient.  I feel that she  should achieve improvement in her symptoms with treatment of her  superficial reflux with laser ablation of her saphenous vein.  Since her  left leg is most symptomatic, I have recommended left leg ablation and,  assuming she gets a good result with this, then would proceed with a  right leg ablation.  She understands this and we will schedule this at  her earliest convenience.   Larina Earthly, M.D.  Electronically Signed   TFE/MEDQ  D:  05/23/2008  T:  05/26/2008  Job:  [id number]   cc:   Corwin Levins, MD

## 2011-04-22 NOTE — H&P (Signed)
[name], [name]                ACCOUNT NO.:  [id number]   MEDICAL RECORD NO.:  [id number]          PATIENT TYPE:  INP   LOCATION:                                FACILITY:  [facility]   PHYSICIAN:  Daniel L. Gottsegen, M.D.DATE OF BIRTH:  1940-12-08   DATE OF ADMISSION:  08/17/2006  DATE OF DISCHARGE:  08/19/2006                                HISTORY & PHYSICAL   CHIEF COMPLAINT:  Symptomatic cystocele and rectocele.   HISTORY OF PRESENT ILLNESS:  The patient is a 70 year old gravida 2, para 2,  abortus 0 who was doing well until early July when she started to notice  something falling out of her vagina.  It has really started to bother her  now.  It constantly is coming out.  She has a lot of discomfort from it.  She has not gotten urinary tract infections or incontinence with it, but she  is very aware of it dropping and it definitely interferes with daily living.  She has seen Windy Fast L. Earlene Plater, M.D. who did a normal urodynamic evaluation on  her and did not feel she needed a sling procedure.  The other issue she is  having is she is having trouble getting stool out and she does have a  rectocele to correspond with that.  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  Dr. Tenny Craw has seen her and said that she feels that she can  undergo surgery and she has been converted from Coumadin to Lovenox for the  surgery and then will be reconverted back to her Coumadin.  The patient had  a vaginal hysterectomy in 1977 and also had to have thumb surgery.   PAST MEDICAL HISTORY:  The patient has had several strokes, arthritis,  hypertension.   MEDICATIONS:  1. Amlodipine.  2. Besylate.  3. Diovan.  4. Warfarin.  5. Vesicare.  6. Vytorin.  7. Celebrex.  8. Omeprazole.  9. Estrajel 0.06%.   ALLERGIES:  BIACTIN.   FAMILY HISTORY:  Her mother and maternal aunt had breast cancer.  She also  has a mother with uterine cancer.   SOCIAL HISTORY:  She is a nonsmoker, nondrinker.   REVIEW OF SYSTEMS:  HEENT:  Negative.  CARDIOVASCULAR:  History of  hypertension as well as previous CVA's.  RESPIRATORY:  Negative.  GASTROINTESTINAL:  History of hemorrhoids.  MUSCULOSKELETAL:  History of  arthritis.  GENITOURINARY:  See above.  NEUROLOGY:  PSYCHIATRIC:  Negative.  ENDOCRINE:  The patient has hypercholesterolemia and is being treated with  Vytorin.   PHYSICAL EXAMINATION:  GENERAL:  The patient is a well-developed, well-  nourished female in no acute distress.  VITAL SIGNS:  Blood pressure 124/74, pulse 80 and regular, respirations 16  and unlabored, and she is afebrile.  HEENT:  Within normal limits.  NECK:  Supple, trachea in the midline.  Thyroid is not enlarged.  LUNGS:  Clear to P&A.  HEART:  No thrills, heaves, or murmurs.  BREASTS:  No masses.  ABDOMEN:  Soft without guarding, rebound, or masses.  PELVIC:  External is  normal, BUS is normal.  Bladder reveals a third degree  cystocele.  Vagina reveals a first degree rectocele.  The patient has very  good vault support.  Cervix and uterus are absent.  Adnexa failed to reveal  masses.  Anus and perineum is normal.  Digital rectal examination is  negative.      Daniel L. Eda Paschal, M.D.  Electronically Signed     DLG/MEDQ  D:  08/17/2006  T:  08/17/2006  Job:  [id number]

## 2011-04-22 NOTE — Op Note (Signed)
NAME:  [name], [name]                          ACCOUNT NO.:  [id number]   MEDICAL RECORD NO.:  [id number]                   PATIENT TYPE:  AMB   LOCATION:  DSC                                  FACILITY:  [facility]   PHYSICIAN:  Lowell Bouton, M.D.      DATE OF BIRTH:  29-Sep-1941   DATE OF PROCEDURE:  09/24/2003  DATE OF DISCHARGE:                                 OPERATIVE REPORT   PREOPERATIVE DIAGNOSIS:  Carpometacarpal arthritis of right thumb.   POSTOPERATIVE DIAGNOSIS:  Carpometacarpal arthritis of right thumb.   PROCEDURE:  Excision of trapezium with flexor carpi radialis stabilization  of the first metacarpal and anchovy arthroplasty of right thumb  carpometacarpal joint.   SURGEON:  Lowell Bouton, M.D.   ANESTHESIA:  General.   OPERATIVE FINDINGS:  The patient had significant arthritic changes both at  the carpometacarpal joint and at the scaphotrapezial joint.  There were some  spurs that had formed around these carpometacarpal joint.   PROCEDURE:  Under general anesthesia with a tourniquet on the right arm, the  right hand was prepped and draped in the usual fashion and after  exsanguinating the limb the tourniquet was inflated to 250 mmHg.  An S  shaped incision was made over the dorsal radial aspect of the  carpometacarpal of the right thumb.  Sharp dissection was carried through  the subcutaneous tissues and bleeding points were coagulated.  Sharp  dissection was carried down between the EPB and the APL tendon onto the  capsule of the joint.  The capsule was incised longitudinally and elevated  sharply away from the trapezium.  With traction on the carpometacarpal joint  the trapezium was then divided into quarters with an osteotome. Care was  taken to preserve the dorsal branch of the radial artery.  The trapezium was  then removed in a piecemeal fashion with a rongeur and after completing  excising it along with any osteophytes, the defect was  prepared for the  tendon arthroplasty.  A rongeur was used to remove the remaining articular  cartilage on the proximal end of the first metacarpal.  A drill hole was  then placed from dorsal radial to the volar ulnar in the base of the first  metacarpal.  It was enlarged with a large drill bit in preparation for the  tendon stabilization.  Three transverse incisions were then made along the  flexor carpi radialis tendon in the volar forearm.  The proximal incision  was bluntly dissected down to the tendon and the radial half of the FCR  tendon was taken sharply.  A 4-0 Mersilene was placed in the portion of the  tendon that was divided and then it was passed from the proximal to the next  distal hole and out through the distal hole at the wrist to use as a tendon  graft.  With a hemostat the tendon was then passed from volar to dorsal in  to  the defect of the trapezium. Half of the FCR tendon was then passed  through the hole in the first metacarpal using a suture passer.  It was  stabilized with 4-0 Mersilene suture to the periosteum at the exit pole.  It  was then rolled back upon itself into an anchovy and secured with 4-0  Mersilene suture in to the defect of the trapezium.  The wound was then  copiously irrigated.  The volar wounds were closed with 4-0 nylon suture.  The dorsal wound was closed with 4-0 Mersilene in the capsule over a vessel  loop drain and  3-0 subcuticular Prolene in the skin.  Steri-Strips were applied followed by  sterile dressings.  The tourniquet was released with good circulation of the  hand.  The patient was placed in a thumb spica splint.  She went to the  recovery room awake and stable in good condition.                                               Lowell Bouton, M.D.    EMM/MEDQ  D:  09/24/2003  T:  09/24/2003  Job:  [id number]   cc:   Corwin Levins, M.D. [facility]

## 2011-04-22 NOTE — Assessment & Plan Note (Signed)
[facility]                              CARDIOLOGY OFFICE NOTE   ORIYA, KETTERING                       MRN:          [id number]  DATE:07/10/2006                            DOB:          09/19/1941    IDENTIFYING INFORMATION/JUSTIFICATION FOR ADMISSION AND CARE:  Ms. Lineman is  a 70 year old woman who I last saw back in April of last year. 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 No evidence of a patent foramen ovale. The patient has been  maintained on Coumadin. When I saw her last, she complained of some  shortness of breath. I encouraged her to use her inhaler and try to increase  her activity. She has done this and has dropped her weight about 20 pounds  and is using the elliptical three times per week, about an hour at each  time. Said that her shortness of breath is much better. She is currently  being evaluated for a bladder/bowel suspension by Dr. Eda Paschal.   CURRENT MEDICATIONS:  Coumadin as directed, Norvasc 5 daily, Diovan 160  daily, Omeprazole 20 daily, Celebrex 200 daily, vitamin E, multivitamin  daily, calcium with D, Estro-gel 0.6 daily, albuterol inhaler p.r.n.,  Metamucil p.r.n., Vytorin 10/80 daily, and Vesicare daily.   PHYSICAL EXAMINATION:  GENERAL:  The patient is in no distress.  VITAL SIGNS:  Blood pressure 118/75, pulse 80, weight 154 down from 174 a  year ago.  LUNGS:  Clear.  CARDIAC:  Regular rate and rhythm.  S1 and S2. No S3.  ABDOMEN:  Benign.  EXTREMITIES:  No edema.   LABORATORY DATA:  A 12 lead EKG:  Normal sinus rhythm at 76 beats per  minute. Anteroseptal MI. Occasional PVC, unchanged from previously.   ICD-9 CLASSIFICATION:  1.  CVA felt to be embolic by her report. MRI has shown multiple small      defects. Would continue on Coumadin. The patient will need cross      bridging with another agent in the preoperative period. Will discuss      with the  Coumadin clinic.  2.  Hypertension, adequate control.  3.  Dyslipidemia. Will need to be in touch with her once I have reviewed her      lipid panel.   Tentative, set up fro 1 year. Again, I think she is at low risk for major  cardiac surgery, given that she has really no symptoms and extremely active  ago. Of course, she will need cross bridging with anticoagulation.                                Pricilla Riffle, MD, [facility]   PVR/MedQ  DD:  07/10/2006  DT:  07/11/2006  Job #:  [id number]   cc:   Reuel Boom L. Eda Paschal, MD

## 2011-04-22 NOTE — Consult Note (Signed)
[name], Ray NO.:  [id number]   MEDICAL RECORD NO.:  [id number]          PATIENT TYPE:  INP   LOCATION:  9318                          FACILITY:  [facility]   PHYSICIAN:  Duke Salvia, MD, FACCDATE OF BIRTH:  [birth year]   DATE OF CONSULTATION:  08/17/2006  DATE OF DISCHARGE:                                   CONSULTATION   Thanks very much for asking [place] to see [name] to assist with  anticoagulation following her repair of a cystocele/rectocele.   She is a 70 year old woman with a history of hypertension, diabetes, normal  left ventricular function who has a history of atrial septal aneurysm which  was felt to be possibility related to stroke. She has been maintained on  Coumadin and was felt to be a sufficient risk that cross-coverage  anticoagulation was indicated.   She has a history of hypertension and some exercise intolerance, but after  being put on a diet program and an exercise program, she has lost 20 to 30  pounds over the last couple of months and feels a good deal better.   PAST MEDICAL HISTORY:  Notable in addition to the above for:  1. Hypertension.  2. Dyslipidemia.  3. Arthritis.   MEDICATIONS:  1. Amlodipine.  2. Diovan.  3. Warfarin.  4. Vesicare.  5. Vytorin.  6. Celebrex.  7. Omeprazole.  8. EstroGel.   SHE IS ALLERGIC TO __________.   FAMILY HISTORY:  Noncontributory.   SOCIAL HISTORY:  She is a nonsmoker, nondrinker.   REVIEW OF SYSTEMS:  As noted on Dr. Verl Dicker HPI and is not further  recounted her.   PHYSICAL EXAMINATION:  GENERAL:  She is an elderly Caucasian female  appearing her stated age of 41.  VITAL SIGNS:  Her blood pressure is 116/52 with a pulse of 72, respirations  16.  HEENT:  Exam demonstrated no xanthomata.  NECK:  Veins were flat. Carotids were brisk and full bilaterally without  bruits.  BACK:  Without kyphosis or scoliosis.  LUNGS:  Clear.  HEART:  Regular without murmurs or  gallops.  ABDOMEN:  Soft but not further examined.  EXTREMITIES:  Were enveloped in pneumatic compression hose.  NEUROLOGICAL:  Grossly normal.   Review of her prior electrocardiogram demonstrated sinus rhythm at 76 with  intervals of 0.18/0.10/0.40; there was poor R-wave progression and an  isolated PVC.   Echocardiogram dated December 22 demonstrated atrial septal aneurysm, normal  left ventricular function.   IMPRESSION:  1. Thromboembolic cerebral vascular accident - multiple, associated with      atrial septal aneurysm.  2. Coumadin for #1.  3. Status post anterior/posterior repair for rectocele/cystocele.  4. Hypertension.   [name] is status post surgery. She is on Coumadin for multiple strokes  that are thought to be thromboembolic in the context of atrial septal  aneurysm. I would recommend that we would resume her Coumadin as soon as  possible, realizing that the effects of the Coumadin will be negligible for  the first couple of days. I will tentatively write for this to be  started in  the morning for Dr. Verl Dicker approval from a postoperative point of view.   Thanks for the consultation. Please let us know if there is anything else we  can do. We will plan to see her in the Coumadin clinic as an outpatient in  the middle of next week.           ______________________________  Duke Salvia, MD, [facility]     SCK/MEDQ  D:  08/17/2006  T:  08/18/2006  Job:  [id number]

## 2011-04-22 NOTE — Discharge Summary (Signed)
[name], [name]                ACCOUNT NO.:  [id number]   MEDICAL RECORD NO.:  [id number]          PATIENT TYPE:  INP   LOCATION:  9318                          FACILITY:  [facility]   PHYSICIAN:  Daniel L. Gottsegen, M.D.DATE OF BIRTH:  February 28, 1941   DATE OF ADMISSION:  08/17/2006  DATE OF DISCHARGE:  08/19/2006                                 DISCHARGE SUMMARY   [name] is a 70 year old gravida 2, para 2 who had presented to the  office with a symptomatic cystocele and rectocele. She was admitted to the  hospital for definitive therapy. She had been fully anticoagulated because  of previous thromboembolic phenomenon, and she was converted from Coumadin  to Lovenox preoperatively. On the day of admission she underwent anterior  and posterior repair without difficulty. Bleeding was only moderate. On this  first postoperative day her Coumadin was reinitiated. She had some episodes  of lightheadedness and dizziness, and she was slightly hypotensive. Her  antihypertensive drugs were stopped, and this corrected the above problem.  On the second postoperative day she began to void well, and she was  discharged.  Discharge medications were the same medications she was on when  she was admitted except she was going to hold her Norvasc and Diovan until  she went back to see the cardiologist in five days. She reinitiated her  preoperative Coumadin regime, but she was going to add two baby aspirin a  day until she got her INR back in normal range. All she was given for pain  medicine to go home was Extra Strength Tylenol which was all she really  seemed to need along with her Celebrex that she took for her arthritis. She  will be seen in the office in three weeks.   DIET ON DISCHARGE:  Regular.   ACTIVITY:  Ambulatory without lifting.   DISCHARGE DIAGNOSIS:  Symptomatic cystocele and rectocele.   OPERATION:  Anterior and posterior repair.   CONDITION ON DISCHARGE:  Improved.      Daniel L. Eda Paschal, M.D.  Electronically Signed     DLG/MEDQ  D:  08/19/2006  T:  08/21/2006  Job:  [id number]

## 2011-04-22 NOTE — Op Note (Signed)
[name], [name]                ACCOUNT NO.:  [id number]   MEDICAL RECORD NO.:  [id number]          PATIENT TYPE:  INP   LOCATION:  9318                          FACILITY:  [facility]   PHYSICIAN:  Daniel L. Gottsegen, M.D.DATE OF BIRTH:  1940/12/23   DATE OF PROCEDURE:  08/17/2006  DATE OF DISCHARGE:                                 OPERATIVE REPORT   PREOPERATIVE DIAGNOSIS:  Cystocele and rectocele.   POSTOPERATIVE DIAGNOSIS:  Cystocele and rectocele.   OPERATION:  Anterior and posterior repair.   SURGEON:  Daniel L. Eda Paschal, M.D.   FIRST ASSISTANT:  Juan H. Lily Peer, M.D.   FINDINGS AT TIME OF SURGERY:  Patient had a third-degree cystocele and a 1.5-  degree rectocele.  She had no enterocele, and she had excellent vault  support.  She had an excellent urethrovesical angle.   PROCEDURE:  After adequate general endotracheal anesthesia, the patient was  placed in the dorsal lithotomy position and prepped and draped in the usual  sterile manner.  An inverted T-incision was made on the anterior wall of the  vagina.  The perivesical fascia and the cystocele were sharply dissected  free from the mucosa in an easy fashion as the patient had good tissue  planes.  The quality of her vesicovaginal fascia was moderate.  It was then  reapproximated after a Foley catheter had been placed in the bladder with  interrupted 2-0 Vicryl which completely eliminated her cystocele.  Approximately 10 sutures were utilized.  Redundant anterior vaginal mucosa  was trimmed away, and the anterior vaginal mucosa was closed with a running  locking 2-0 Vicryl, also picking up fascia underneath for good support.  The  procedure was terminated at that point as she had gotten an excellent  repair.  Urine was clear and attention was next turned to the posterior  repair. Her perineum was well supported, so it was elected just to do a  posterior repair without having to do any perineoplasty.  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  Perirectal fascia and rectocele were sharply dissected free.  This was a  more difficult dissection than the anterior repair.  The surgeon did  approximately half the repair with his finger in the rectum because the  rectum was exceedingly thin. This was done without entering the rectum.  The  posterior wall was then closed in 2 layers.  The perirectal fascia was  closed in 1 layer eliminating the rectocele.  It took approximately 7  sutures to do so.  Redundant vaginal mucosa was trimmed away and then a  second layer closed the mucosa and also picked up the fascia for additional  support.  The vagina was then packed with a 1-inch Iodoform pack. Bleeding  was somewhat brisk, possibly because of her anticoagulation.  With the  termination of the procedure, there was no bleeding noted.  Blood loss was  somewhere in the range between 100 and 200 mL.  She left the operating room  in satisfactory condition draining clear urine from her  Foley catheter.     Daniel L. Eda Paschal, M.D.  Electronically Signed    DLG/MEDQ  D:  08/17/2006  T:  08/18/2006  Job:  [id number]

## 2011-05-10 ENCOUNTER — Ambulatory Visit (INDEPENDENT_AMBULATORY_CARE_PROVIDER_SITE_OTHER): Payer: BC Managed Care – PPO | Admitting: *Deleted

## 2011-05-10 DIAGNOSIS — G459 Transient cerebral ischemic attack, unspecified: Secondary | ICD-10-CM

## 2011-05-10 DIAGNOSIS — Z7901 Long term (current) use of anticoagulants: Secondary | ICD-10-CM

## 2011-05-10 DIAGNOSIS — Z8679 Personal history of other diseases of the circulatory system: Secondary | ICD-10-CM

## 2011-05-10 LAB — POCT INR: INR: 2.4

## 2011-06-07 ENCOUNTER — Encounter: Payer: BC Managed Care – PPO | Admitting: *Deleted

## 2011-06-07 ENCOUNTER — Ambulatory Visit (INDEPENDENT_AMBULATORY_CARE_PROVIDER_SITE_OTHER): Payer: BC Managed Care – PPO | Admitting: *Deleted

## 2011-06-07 DIAGNOSIS — Z7901 Long term (current) use of anticoagulants: Secondary | ICD-10-CM

## 2011-06-07 DIAGNOSIS — Z8679 Personal history of other diseases of the circulatory system: Secondary | ICD-10-CM

## 2011-06-07 DIAGNOSIS — G459 Transient cerebral ischemic attack, unspecified: Secondary | ICD-10-CM

## 2011-06-07 LAB — POCT INR: INR: 2.8

## 2011-06-07 MED ORDER — WARFARIN SODIUM 5 MG PO TABS: ORAL_TABLET | ORAL | Status: DC

## 2011-06-14 ENCOUNTER — Encounter: Payer: Self-pay | Admitting: Internal Medicine

## 2011-06-16 ENCOUNTER — Other Ambulatory Visit: Payer: Self-pay | Admitting: Internal Medicine

## 2011-07-05 ENCOUNTER — Ambulatory Visit (INDEPENDENT_AMBULATORY_CARE_PROVIDER_SITE_OTHER): Payer: BC Managed Care – PPO | Admitting: *Deleted

## 2011-07-05 DIAGNOSIS — Z7901 Long term (current) use of anticoagulants: Secondary | ICD-10-CM

## 2011-07-05 DIAGNOSIS — G459 Transient cerebral ischemic attack, unspecified: Secondary | ICD-10-CM

## 2011-07-05 DIAGNOSIS — Z8679 Personal history of other diseases of the circulatory system: Secondary | ICD-10-CM

## 2011-07-05 LAB — POCT INR: INR: 3.3

## 2011-07-07 ENCOUNTER — Other Ambulatory Visit: Payer: Self-pay | Admitting: Obstetrics and Gynecology

## 2011-07-07 DIAGNOSIS — Z1231 Encounter for screening mammogram for malignant neoplasm of breast: Secondary | ICD-10-CM

## 2011-07-08 ENCOUNTER — Encounter: Payer: BC Managed Care – PPO | Admitting: *Deleted

## 2011-07-16 ENCOUNTER — Other Ambulatory Visit: Payer: Self-pay | Admitting: Internal Medicine

## 2011-07-18 NOTE — Telephone Encounter (Signed)
Coumadin rx refill request

## 2011-07-28 ENCOUNTER — Telehealth: Payer: Self-pay

## 2011-07-28 DIAGNOSIS — Z0000 Encounter for general adult medical examination without abnormal findings: Secondary | ICD-10-CM

## 2011-07-28 NOTE — Telephone Encounter (Signed)
Put order in for labs. 

## 2011-08-01 ENCOUNTER — Other Ambulatory Visit: Payer: Self-pay | Admitting: Internal Medicine

## 2011-08-01 ENCOUNTER — Other Ambulatory Visit: Payer: BC Managed Care – PPO

## 2011-08-01 ENCOUNTER — Ambulatory Visit (INDEPENDENT_AMBULATORY_CARE_PROVIDER_SITE_OTHER): Payer: BC Managed Care – PPO | Admitting: *Deleted

## 2011-08-01 ENCOUNTER — Other Ambulatory Visit (INDEPENDENT_AMBULATORY_CARE_PROVIDER_SITE_OTHER): Payer: BC Managed Care – PPO

## 2011-08-01 DIAGNOSIS — Z0000 Encounter for general adult medical examination without abnormal findings: Secondary | ICD-10-CM

## 2011-08-01 DIAGNOSIS — Z7901 Long term (current) use of anticoagulants: Secondary | ICD-10-CM

## 2011-08-01 DIAGNOSIS — G459 Transient cerebral ischemic attack, unspecified: Secondary | ICD-10-CM

## 2011-08-01 DIAGNOSIS — Z8679 Personal history of other diseases of the circulatory system: Secondary | ICD-10-CM

## 2011-08-01 LAB — CBC WITH DIFFERENTIAL/PLATELET
Basophils Absolute: 0 10*3/uL (ref 0.0–0.1)
Basophils Relative: 1.1 % (ref 0.0–3.0)
Eosinophils Absolute: 0.1 10*3/uL (ref 0.0–0.7)
Eosinophils Relative: 2.3 % (ref 0.0–5.0)
HCT: 46 % (ref 36.0–46.0)
Hemoglobin: 15.5 g/dL — ABNORMAL HIGH (ref 12.0–15.0)
Lymphocytes Relative: 29 % (ref 12.0–46.0)
Lymphs Abs: 1.1 10*3/uL (ref 0.7–4.0)
MCHC: 33.7 g/dL (ref 30.0–36.0)
MCV: 88.8 fl (ref 78.0–100.0)
Monocytes Absolute: 0.6 10*3/uL (ref 0.1–1.0)
Monocytes Relative: 15.3 % — ABNORMAL HIGH (ref 3.0–12.0)
Neutro Abs: 2 10*3/uL (ref 1.4–7.7)
Neutrophils Relative %: 52.3 % (ref 43.0–77.0)
Platelets: 191 10*3/uL (ref 150.0–400.0)
RBC: 5.18 Mil/uL — ABNORMAL HIGH (ref 3.87–5.11)
RDW: 13.1 % (ref 11.5–14.6)
WBC: 3.8 10*3/uL — ABNORMAL LOW (ref 4.5–10.5)

## 2011-08-01 LAB — HEPATIC FUNCTION PANEL
ALT: 19 U/L (ref 0–35)
AST: 24 U/L (ref 0–37)
Albumin: 4.6 g/dL (ref 3.5–5.2)
Alkaline Phosphatase: 77 U/L (ref 39–117)
Bilirubin, Direct: 0 mg/dL (ref 0.0–0.3)
Total Bilirubin: 0.5 mg/dL (ref 0.3–1.2)
Total Protein: 7.5 g/dL (ref 6.0–8.3)

## 2011-08-01 LAB — URINALYSIS, ROUTINE W REFLEX MICROSCOPIC
Bilirubin Urine: NEGATIVE
Ketones, ur: NEGATIVE
Leukocytes, UA: NEGATIVE
Nitrite: NEGATIVE
Specific Gravity, Urine: 1.01 (ref 1.000–1.030)
Total Protein, Urine: NEGATIVE
Urine Glucose: NEGATIVE
Urobilinogen, UA: 0.2 (ref 0.0–1.0)
pH: 6 (ref 5.0–8.0)

## 2011-08-01 LAB — LIPID PANEL
Cholesterol: 287 mg/dL — ABNORMAL HIGH (ref 0–200)
HDL: 57.2 mg/dL (ref >39.00)
Total CHOL/HDL Ratio: 5
Triglycerides: 205 mg/dL — ABNORMAL HIGH (ref 0.0–149.0)
VLDL: 41 mg/dL — ABNORMAL HIGH (ref 0.0–40.0)

## 2011-08-01 LAB — TSH: TSH: 1.71 u[IU]/mL (ref 0.35–5.50)

## 2011-08-01 LAB — BASIC METABOLIC PANEL
BUN: 26 mg/dL — ABNORMAL HIGH (ref 6–23)
CO2: 28 mEq/L (ref 19–32)
Calcium: 9.6 mg/dL (ref 8.4–10.5)
Chloride: 103 mEq/L (ref 96–112)
Creatinine, Ser: 0.8 mg/dL (ref 0.4–1.2)
GFR: 72.84 mL/min (ref >60.00)
Glucose, Bld: 88 mg/dL (ref 70–99)
Potassium: 5.2 mEq/L — ABNORMAL HIGH (ref 3.5–5.1)
Sodium: 140 mEq/L (ref 135–145)

## 2011-08-01 LAB — LDL CHOLESTEROL, DIRECT: Direct LDL: 207.3 mg/dL

## 2011-08-01 LAB — POCT INR: INR: 2.7

## 2011-08-04 ENCOUNTER — Other Ambulatory Visit: Payer: BC Managed Care – PPO

## 2011-08-07 ENCOUNTER — Encounter: Payer: Self-pay | Admitting: Internal Medicine

## 2011-08-07 DIAGNOSIS — Z0000 Encounter for general adult medical examination without abnormal findings: Secondary | ICD-10-CM | POA: Insufficient documentation

## 2011-08-11 ENCOUNTER — Encounter: Payer: Self-pay | Admitting: Internal Medicine

## 2011-08-11 ENCOUNTER — Ambulatory Visit (INDEPENDENT_AMBULATORY_CARE_PROVIDER_SITE_OTHER): Payer: BC Managed Care – PPO | Admitting: Internal Medicine

## 2011-08-11 VITALS — BP 120/62 | HR 74 | Temp 98.0°F | Ht 63.0 in | Wt 155.2 lb

## 2011-08-11 DIAGNOSIS — E785 Hyperlipidemia, unspecified: Secondary | ICD-10-CM

## 2011-08-11 DIAGNOSIS — I10 Essential (primary) hypertension: Secondary | ICD-10-CM

## 2011-08-11 DIAGNOSIS — Z0000 Encounter for general adult medical examination without abnormal findings: Secondary | ICD-10-CM

## 2011-08-11 DIAGNOSIS — Z23 Encounter for immunization: Secondary | ICD-10-CM

## 2011-08-11 MED ORDER — VALSARTAN 160 MG PO TABS: 160.0000 mg | ORAL_TABLET | Freq: Every day | ORAL | Status: DC

## 2011-08-11 MED ORDER — FLUOCINONIDE 0.05 % EX CREA: TOPICAL_CREAM | Freq: Two times a day (BID) | CUTANEOUS | Status: DC | PRN

## 2011-08-11 MED ORDER — NIACIN ER (ANTIHYPERLIPIDEMIC) 750 MG PO TBCR: 750.0000 mg | EXTENDED_RELEASE_TABLET | Freq: Every day | ORAL | Status: DC

## 2011-08-11 MED ORDER — AMLODIPINE BESYLATE 5 MG PO TABS: 5.0000 mg | ORAL_TABLET | Freq: Every day | ORAL | Status: DC

## 2011-08-11 NOTE — Progress Notes (Signed)
Subjective:    Patient ID: [name], female    DOB: 1941/07/18, 70 y.o.   MRN: [id number]  HPI  Here for wellness and f/u;  Overall doing ok;  Pt denies CP, worsening SOB, DOE, wheezing, orthopnea, PND, worsening LE edema, palpitations, dizziness or syncope.  Pt denies neurological change such as new Headache, facial or extremity weakness.  Pt denies polydipsia, polyuria, or low sugar symptoms. Pt states overall good compliance with treatment and medications, good tolerability, and trying to follow lower cholesterol diet.  Pt denies worsening depressive symptoms, suicidal ideation or panic. No fever, wt loss, night sweats, loss of appetite, or other constitutional symptoms.  Pt states good ability with ADL's, low fall risk, home safety reviewed and adequate, no significant changes in hearing or vision, and occasionally active with exercise.  Unfortunately could not eventually tolerate the crestor due to fatigue and general pain.  Adamant she will not take further statin for now, and refuses any other brand name med such as welchol/zetia due to cost.  Needs exforge changed as well.  Loses her current insurance nov 1, as husband will stop working at that time, has had several illnesses of his own. Has not yet tried niacin. Past Medical History  Diagnosis Date  . SBE (subacute bacterial endocarditis) prophylaxis candidate   . COPD (chronic obstructive pulmonary disease)   . GERD (gastroesophageal reflux disease)   . Fibrocystic breast disease   . HLD (hyperlipidemia)   . Osteoarthritis   . HTN (hypertension)   . IBS (irritable bowel syndrome)   . Hemorrhoids   . History of cerebrovascular accident   . TIA (transient ischemic attack)     anticoagulation therapy on coumadin  . Nephrolithiasis     hx   Past Surgical History  Procedure Date  . Hysterectomy (other)   . Bladder suspension   . Rectocele repair     reports that she has quit smoking. She does not have any smokeless tobacco  history on file. She reports that she drinks alcohol. She reports that she does not use illicit drugs. family history includes Breast cancer in her mother; COPD in her mother; and Uterine cancer in her mother. Allergies  Allergen Reactions  . Clarithromycin     REACTION: possible rash but could have been legionarres dz with rash  . Lipitor (Atorvastatin Calcium)     Fatigue, joint pain  . Crestor (Rosuvastatin Calcium)     fagitue and joint pain   Current Outpatient Prescriptions on File Prior to Visit  Medication Sig Dispense Refill  . albuterol (PROAIR HFA) 108 (90 BASE) MCG/ACT inhaler Inhale into the lungs. 1-2 puffs every 4-6 hours as needed       . azelastine (ASTELIN) 137 MCG/SPRAY nasal spray Place 1 spray into the nose 2 (two) times daily as needed. As needed alternate with singulair        . celecoxib (CELEBREX) 100 MG capsule Take 100 mg by mouth daily as needed.        . chlorpheniramine-HYDROcodone (TUSSIONEX PENNKINETIC ER) 10-8 MG/5ML LQCR Take 5 mLs by mouth 2 (two) times daily as needed.        . Estradiol (ESTROGEL) 0.75 MG/1.25 GM (0.06%) topical gel Place 1.25 g onto the skin as directed.        . montelukast (SINGULAIR) 10 MG tablet Take 10 mg by mouth daily as needed.        Marland Kitchen omeprazole (PRILOSEC) 20 MG capsule TAKE 2 CAPSULES BY MOUTH ONCE  A DAY  180 capsule  2  . scopolamine (TRANSDERM-SCOP) 1.5 MG Place 1 patch onto the skin every 3 (three) days.        Marland Kitchen warfarin (COUMADIN) 5 MG tablet TAKE AS DIRECTED BY COUMADIN CLINIC.  90 tablet  1   Review of Systems Review of Systems  Constitutional: Negative for diaphoresis, activity change, appetite change and unexpected weight change.  HENT: Negative for hearing loss, ear pain, facial swelling, mouth sores and neck stiffness.   Eyes: Negative for pain, redness and visual disturbance.  Respiratory: Negative for shortness of breath and wheezing.   Cardiovascular: Negative for chest pain and palpitations.    Gastrointestinal: Negative for diarrhea, blood in stool, abdominal distention and rectal pain.  Genitourinary: Negative for hematuria, flank pain and decreased urine volume.  Musculoskeletal: Negative for myalgias and joint swelling.  Skin: Negative for color change and wound.  Neurological: Negative for syncope and numbness.  Hematological: Negative for adenopathy.  Psychiatric/Behavioral: Negative for hallucinations, self-injury, decreased concentration and agitation.      Objective:   Physical Exam BP 120/62  Pulse 74  Temp(Src) 98 F (36.7 C) (Oral)  Ht 5\' 3"  (1.6 m)  Wt 155 lb 4 oz (70.421 kg)  BMI 27.50 kg/m2  SpO2 96% Physical Exam  VS noted, mild obese, elderly but quite vigorous and active appearing Constitutional: Pt is oriented to person, place, and time. Appears well-developed and well-nourished.  HENT:  Head: Normocephalic and atraumatic.  Right Ear: External ear normal.  Left Ear: External ear normal.  Nose: Nose normal.  Mouth/Throat: Oropharynx is clear and moist.  Eyes: Conjunctivae and EOM are normal. Pupils are equal, round, and reactive to light.  Neck: Normal range of motion. Neck supple. No JVD present. No tracheal deviation present.  Cardiovascular: Normal rate, regular rhythm, normal heart sounds and intact distal pulses.   Pulmonary/Chest: Effort normal and breath sounds normal.  Abdominal: Soft. Bowel sounds are normal. There is no tenderness.  Musculoskeletal: Normal range of motion. Exhibits no edema.  Lymphadenopathy:  Has no cervical adenopathy.  Neurological: Pt is alert and oriented to person, place, and time. Pt has normal reflexes. No cranial nerve deficit.  Skin: Skin is warm and dry. No rash noted.  Psychiatric:  Has  normal mood and affect. Behavior is normal. 1+ nervous        Assessment & Plan:

## 2011-08-11 NOTE — Assessment & Plan Note (Signed)

## 2011-08-11 NOTE — Assessment & Plan Note (Signed)
Severe elev, pt adamant no further statin or brand name meds;  Will try lower dose niacin, consider increase if able to tolerate

## 2011-08-11 NOTE — Assessment & Plan Note (Signed)
stable overall by hx and exam, most recent data reviewed with pt, and pt to continue medical treatment as before  Ok to change exforge to generic equivalents

## 2011-08-11 NOTE — Patient Instructions (Addendum)
You had the flu shot today Ok to stop the crestor as you have Start the niacin as directed Your cream refill was sent to the pharmacy Continue the exforge until the current bottle runs out, then change to the generic amlodipine, and diovan Please return in 6 mo with Lab testing done 3-5 days before

## 2011-08-12 ENCOUNTER — Telehealth: Payer: Self-pay

## 2011-08-12 MED ORDER — NIACIN ER (ANTIHYPERLIPIDEMIC) 500 MG PO TBCR: 500.0000 mg | EXTENDED_RELEASE_TABLET | Freq: Every day | ORAL | Status: DC

## 2011-08-12 NOTE — Telephone Encounter (Signed)
Patient is requesting less expensive alternative to Niacin sent to [facility]

## 2011-08-12 NOTE — Telephone Encounter (Signed)
Change the niacin 750 to the 500 mg - done per emr

## 2011-08-17 DIAGNOSIS — N951 Menopausal and female climacteric states: Secondary | ICD-10-CM | POA: Insufficient documentation

## 2011-08-17 DIAGNOSIS — M1990 Unspecified osteoarthritis, unspecified site: Secondary | ICD-10-CM | POA: Insufficient documentation

## 2011-08-17 DIAGNOSIS — N938 Other specified abnormal uterine and vaginal bleeding: Secondary | ICD-10-CM | POA: Insufficient documentation

## 2011-08-17 DIAGNOSIS — Z8673 Personal history of transient ischemic attack (TIA), and cerebral infarction without residual deficits: Secondary | ICD-10-CM | POA: Insufficient documentation

## 2011-08-17 DIAGNOSIS — N3281 Overactive bladder: Secondary | ICD-10-CM | POA: Insufficient documentation

## 2011-08-17 DIAGNOSIS — N952 Postmenopausal atrophic vaginitis: Secondary | ICD-10-CM | POA: Insufficient documentation

## 2011-08-24 ENCOUNTER — Ambulatory Visit
Admission: RE | Admit: 2011-08-24 | Discharge: 2011-08-24 | Disposition: A | Discharge status: Home / Self Care | Payer: BC Managed Care – PPO | Source: Ambulatory Visit | Attending: Obstetrics and Gynecology | Admitting: Obstetrics and Gynecology

## 2011-08-24 DIAGNOSIS — Z1231 Encounter for screening mammogram for malignant neoplasm of breast: Secondary | ICD-10-CM

## 2011-08-29 ENCOUNTER — Ambulatory Visit (INDEPENDENT_AMBULATORY_CARE_PROVIDER_SITE_OTHER): Payer: BC Managed Care – PPO | Admitting: *Deleted

## 2011-08-29 DIAGNOSIS — Z7901 Long term (current) use of anticoagulants: Secondary | ICD-10-CM

## 2011-08-29 DIAGNOSIS — G459 Transient cerebral ischemic attack, unspecified: Secondary | ICD-10-CM

## 2011-08-29 DIAGNOSIS — Z8679 Personal history of other diseases of the circulatory system: Secondary | ICD-10-CM

## 2011-08-29 LAB — POCT INR: INR: 2.3

## 2011-08-31 ENCOUNTER — Ambulatory Visit (INDEPENDENT_AMBULATORY_CARE_PROVIDER_SITE_OTHER): Payer: BC Managed Care – PPO | Admitting: Obstetrics and Gynecology

## 2011-08-31 ENCOUNTER — Encounter: Payer: Self-pay | Admitting: Obstetrics and Gynecology

## 2011-08-31 VITALS — BP 130/80 | Ht 63.0 in | Wt 154.0 lb

## 2011-08-31 DIAGNOSIS — N951 Menopausal and female climacteric states: Secondary | ICD-10-CM

## 2011-08-31 DIAGNOSIS — Z780 Asymptomatic menopausal state: Secondary | ICD-10-CM

## 2011-08-31 DIAGNOSIS — C4491 Basal cell carcinoma of skin, unspecified: Secondary | ICD-10-CM | POA: Insufficient documentation

## 2011-08-31 DIAGNOSIS — N8111 Cystocele, midline: Secondary | ICD-10-CM

## 2011-08-31 DIAGNOSIS — R351 Nocturia: Secondary | ICD-10-CM

## 2011-08-31 DIAGNOSIS — IMO0002 Reserved for concepts with insufficient information to code with codable children: Secondary | ICD-10-CM

## 2011-08-31 DIAGNOSIS — R3915 Urgency of urination: Secondary | ICD-10-CM

## 2011-08-31 MED ORDER — ESTRADIOL 0.75 MG/1.25 GM (0.06%) TD GEL: 1.2500 g | TRANSDERMAL | Status: DC

## 2011-08-31 NOTE — Progress Notes (Signed)
Subjective:     Patient ID: [name], female   DOB: 1941-04-20, 70 y.o.   MRN: [id number]  HPIthe patient came back to see me today for further followup. She feels that her bladder has dropped a little bit more and she is more aware of it when she cleans herself. She continues to have urinary frequency, nocturia, and urgency without incontinence. She has no dysuria. She is having no vaginal bleeding. She has had 2 previous repairs of her bladder. She does not take medication for the urgency and frequency due to side effects. She is found that the Estrogel is helped significantly with menopausal symptoms. She has had her mammogram which was normal. She is up-to-date on bone densities.   Review of Systems  Constitutional: Negative.   HENT: Positive for rhinorrhea.   Eyes: Negative.   Respiratory: Negative.        COPD  Cardiovascular:       Elevated cholesterol on medication. Hypertension.  Gastrointestinal:       GERD  Genitourinary: Positive for urgency and frequency.  Musculoskeletal:       Osteoarthritis  Neurological:       Previous TIA now on Coumadin  Hematological: Negative.   Psychiatric/Behavioral: Negative.        Objective:   Physical ExamHEENT: Within normal limits. Neck: No masses. Supraclavicular lymph nodes: Not enlarged. Breasts: Examined in both sitting and lying position. Symmetrical without skin changes or masses. Abdomen: Soft no masses guarding or rebound. No hernias. Pelvic: External within normal limits. BUS within normal limits. Vaginal examination shows good estrogen effect,  1-2 degree cystocele, no enterocele or rectocele. Cervix and uterus absent. Adnexa within normal limits. Rectovaginal confirmatory. Extremities within normal limits.      Assessment:     #1. Menopausal symptoms #2. Cystocele #3. Urinary urgency #4. Nocturia    Plan:     Continue Estrogel one spray in each arm daily. Continuing mammograms. Discussed in detail Toviaz or  even nerve stimulating surgery. Discussed pessary for worsening prolapse.

## 2011-09-15 LAB — PROTIME-INR
INR: 1.1
Prothrombin Time: 14

## 2011-09-15 LAB — TYPE AND SCREEN
ABO/RH(D): O POS
Antibody Screen: NEGATIVE

## 2011-09-15 LAB — HEMOGLOBIN AND HEMATOCRIT, BLOOD
HCT: 36.2
HCT: 36.5
Hemoglobin: 12.5
Hemoglobin: 12.5

## 2011-09-15 LAB — ABO/RH: ABO/RH(D): O POS

## 2011-09-16 ENCOUNTER — Telehealth: Payer: Self-pay | Admitting: *Deleted

## 2011-09-16 LAB — BASIC METABOLIC PANEL
BUN: 12
CO2: 26
Calcium: 9.2
Chloride: 102
Creatinine, Ser: 0.97
GFR calc Af Amer: >60
GFR calc non Af Amer: 57 — ABNORMAL LOW
Glucose, Bld: 162 — ABNORMAL HIGH
Potassium: 3.7
Sodium: 136

## 2011-09-16 LAB — PROTIME-INR
INR: 2.8 — ABNORMAL HIGH
Prothrombin Time: 30.4 — ABNORMAL HIGH

## 2011-09-16 LAB — CBC
HCT: 42.8
Hemoglobin: 14.8
MCHC: 34.7
MCV: 87
Platelets: 174
RBC: 4.92
RDW: 13.3
WBC: 4.2

## 2011-09-16 LAB — APTT: aPTT: 38 — ABNORMAL HIGH

## 2011-09-16 NOTE — Telephone Encounter (Signed)
Pt diagnosed with PNA.  Had shot of rocephin yesterday.  On Levaquin 750mg  x 10 days and Prednisone 40mg  x 5 days.   Will check INR on Monday.

## 2011-09-16 NOTE — Telephone Encounter (Signed)
Pt needs to speak to you about a medication she has started.

## 2011-09-19 ENCOUNTER — Ambulatory Visit (INDEPENDENT_AMBULATORY_CARE_PROVIDER_SITE_OTHER): Payer: BC Managed Care – PPO | Admitting: *Deleted

## 2011-09-19 DIAGNOSIS — G459 Transient cerebral ischemic attack, unspecified: Secondary | ICD-10-CM

## 2011-09-19 DIAGNOSIS — Z7901 Long term (current) use of anticoagulants: Secondary | ICD-10-CM

## 2011-09-19 DIAGNOSIS — Z8679 Personal history of other diseases of the circulatory system: Secondary | ICD-10-CM

## 2011-09-19 LAB — POCT INR: INR: 5.1

## 2011-09-20 ENCOUNTER — Telehealth: Payer: Self-pay

## 2011-09-20 MED ORDER — ATORVASTATIN CALCIUM 10 MG PO TABS: 10.0000 mg | ORAL_TABLET | Freq: Every day | ORAL | Status: DC

## 2011-09-20 NOTE — Telephone Encounter (Signed)
Pt advised.

## 2011-09-20 NOTE — Telephone Encounter (Signed)
Pt called stating she has been experiencing severe leg cramps on Niacin. She stopped medication for a short time and cramps resolved, returned when she restarted so she had now discontinued medication completely. Pt is requesting to try generic Lipitor. Lipitor is on allergy list but pt stated that she had leg cramps at that time but she was also Dx with Legionaries Disease and it was not clear if that may have been the cause of cramps. Please advise, okay to switch to Generic Lipitor?

## 2011-09-20 NOTE — Telephone Encounter (Signed)
Ok - done per emr 

## 2011-09-26 ENCOUNTER — Encounter: Payer: BC Managed Care – PPO | Admitting: *Deleted

## 2011-09-29 ENCOUNTER — Ambulatory Visit (INDEPENDENT_AMBULATORY_CARE_PROVIDER_SITE_OTHER): Payer: BC Managed Care – PPO | Admitting: Internal Medicine

## 2011-09-29 ENCOUNTER — Ambulatory Visit (INDEPENDENT_AMBULATORY_CARE_PROVIDER_SITE_OTHER): Payer: BC Managed Care – PPO | Admitting: *Deleted

## 2011-09-29 ENCOUNTER — Encounter: Payer: Self-pay | Admitting: Internal Medicine

## 2011-09-29 VITALS — BP 118/62 | HR 78 | Temp 98.3°F | Ht 63.5 in | Wt 157.4 lb

## 2011-09-29 DIAGNOSIS — I10 Essential (primary) hypertension: Secondary | ICD-10-CM

## 2011-09-29 DIAGNOSIS — G459 Transient cerebral ischemic attack, unspecified: Secondary | ICD-10-CM

## 2011-09-29 DIAGNOSIS — J449 Chronic obstructive pulmonary disease, unspecified: Secondary | ICD-10-CM

## 2011-09-29 DIAGNOSIS — J4489 Other specified chronic obstructive pulmonary disease: Secondary | ICD-10-CM

## 2011-09-29 DIAGNOSIS — E785 Hyperlipidemia, unspecified: Secondary | ICD-10-CM

## 2011-09-29 DIAGNOSIS — Z7901 Long term (current) use of anticoagulants: Secondary | ICD-10-CM

## 2011-09-29 DIAGNOSIS — G4762 Sleep related leg cramps: Secondary | ICD-10-CM

## 2011-09-29 DIAGNOSIS — Z8679 Personal history of other diseases of the circulatory system: Secondary | ICD-10-CM

## 2011-09-29 LAB — POCT INR: INR: 3.7

## 2011-09-29 MED ORDER — TIOTROPIUM BROMIDE MONOHYDRATE 18 MCG IN CAPS: 18.0000 ug | ORAL_CAPSULE | Freq: Every day | RESPIRATORY_TRACT | Status: DC

## 2011-09-29 MED ORDER — CYCLOBENZAPRINE HCL 5 MG PO TABS: ORAL_TABLET | ORAL | Status: DC

## 2011-09-29 NOTE — Progress Notes (Signed)
Subjective:    Patient ID: [name], female    DOB: August 11, 1941, 70 y.o.   MRN: [id number]  HPI  Here with recurrent leg cramps nocturnal only that seemd related to niaspan, better to stop it, but has recurred less severe but persistent.  Also on lipitor but she is hoping to stay on the lipitor, since.  Also with recent pna with bronchospasm better after seeingpulm with levaquin and prednisone tx.  Now started back at the gym after being away for 1 wk with the pna, and back to doing the eilliptical, now with leg cramps again the psat 2 nights, and really wants to try to keep excercising.  Recent K and ca normal.  Wants to re-start spriva as well - has stopped due to cost, but wants to re-start now.  Pt denies chest pain, increased sob or doe, wheezing, orthopnea, PND, increased LE swelling, palpitations, dizziness or syncope.  Pt denies new neurological symptoms such as new headache, or facial or extremity weakness or numbness   Pt denies polydipsia, polyuria. Denies worsening depressive symptoms, suicidal ideation, or panic Past Medical History  Diagnosis Date  . SBE (subacute bacterial endocarditis) prophylaxis candidate   . COPD (chronic obstructive pulmonary disease)   . GERD (gastroesophageal reflux disease)   . Fibrocystic breast disease   . HLD (hyperlipidemia)   . Osteoarthritis   . IBS (irritable bowel syndrome)   . Hemorrhoids   . History of cerebrovascular accident   . TIA (transient ischemic attack)     anticoagulation therapy on coumadin  . Nephrolithiasis     hx  . HTN (hypertension)   . Stroke     STROKES  . DUB (dysfunctional uterine bleeding)   . Atrophic vaginitis   . Menopausal symptoms   . DI (detrusor instability)   . Basal cell carcinoma    Past Surgical History  Procedure Date  . Hysterectomy (other)   . Bladder suspension   . Excision of basal cell ca 2011  . Vaginal hysterectomy 1983    NO BSO-DR. MABRY  . Thumb surgery   . Rectocele repair 2008   A&P REPAIR -DR. MCDIARMID  . Anterior and posterior vaginal repair 2008    A&P REPAIR-DR MCDIARMID    reports that she has quit smoking. She does not have any smokeless tobacco history on file. She reports that she drinks alcohol. She reports that she does not use illicit drugs. family history includes Breast cancer in her mother; COPD in her mother; Cancer in her mother; and Uterine cancer in her mother. Allergies  Allergen Reactions  . Biaxin   . Clarithromycin     REACTION: possible rash but could have been legionarres dz with rash  . Crestor (Rosuvastatin Calcium)     fagitue and joint pain   Current Outpatient Prescriptions on File Prior to Visit  Medication Sig Dispense Refill  . albuterol (PROAIR HFA) 108 (90 BASE) MCG/ACT inhaler Inhale into the lungs. 1-2 puffs every 4-6 hours as needed       . atorvastatin (LIPITOR) 10 MG tablet Take 1 tablet (10 mg total) by mouth daily.  90 tablet  3  . azelastine (ASTELIN) 137 MCG/SPRAY nasal spray Place 1 spray into the nose 2 (two) times daily as needed. As needed alternate with singulair        . Calcium Carbonate-Vitamin D (CALCIUM + D PO) Take by mouth.        . celecoxib (CELEBREX) 100 MG capsule Take 100 mg by  mouth daily as needed.        . chlorpheniramine-HYDROcodone (TUSSIONEX PENNKINETIC ER) 10-8 MG/5ML LQCR Take 5 mLs by mouth 2 (two) times daily as needed.        . Estradiol (ESTROGEL) 0.75 MG/1.25 GM (0.06%) topical gel Place 1.25 g onto the skin as directed.  1 Bottle  12  . fluocinonide (LIDEX) 0.05 % cream Apply topically 2 (two) times daily as needed. Use as directed twice per day as needed  30 g  1  . montelukast (SINGULAIR) 10 MG tablet Take 10 mg by mouth daily as needed.        . Multiple Vitamin (MULTIVITAMIN) tablet Take 1 tablet by mouth daily.        Marland Kitchen omeprazole (PRILOSEC) 20 MG capsule TAKE 2 CAPSULES BY MOUTH ONCE A DAY  180 capsule  2  . scopolamine (TRANSDERM-SCOP) 1.5 MG Place 1 patch onto the skin every 3  (three) days.        Marland Kitchen warfarin (COUMADIN) 5 MG tablet TAKE AS DIRECTED BY COUMADIN CLINIC.  90 tablet  1  . amLODipine (NORVASC) 5 MG tablet Take 1 tablet (5 mg total) by mouth daily.  90 tablet  3  . valsartan (DIOVAN) 160 MG tablet Take 1 tablet (160 mg total) by mouth daily.  90 tablet  3   Review of Systems Review of Systems  Constitutional: Negative for diaphoresis and unexpected weight change.  HENT: Negative for drooling and tinnitus.   Eyes: Negative for photophobia and visual disturbance.  Respiratory: Negative for choking and stridor.   Gastrointestinal: Negative for vomiting and blood in stool.  Genitourinary: Negative for hematuria and decreased urine volume.     Objective:   Physical Exam BP 118/62  Pulse 78  Temp(Src) 98.3 F (36.8 C) (Oral)  Ht 5' 3.5" (1.613 m)  Wt 157 lb 6 oz (71.385 kg)  BMI 27.44 kg/m2  SpO2 93% Physical Exam  VS noted Constitutional: Pt appears well-developed and well-nourished.  HENT: Head: Normocephalic.  Right Ear: External ear normal.  Left Ear: External ear normal.  Eyes: Conjunctivae and EOM are normal. Pupils are equal, round, and reactive to light.  Neck: Normal range of motion. Neck supple.  Cardiovascular: Normal rate and regular rhythm.   Pulmonary/Chest: Effort normal and breath sounds decreased without rales/wheeze.  Neurological: Pt is alert. No cranial nerve deficit.  Skin: Skin is warm. No erythema.  Psychiatric: Pt behavior is normal. Thought content normal.     Assessment & Plan:

## 2011-09-29 NOTE — Assessment & Plan Note (Signed)
stable overall by hx and exam, most recent data reviewed with pt, and pt to continue medical treatment as before, wants to re-start the spiriva  SpO2 Readings from Last 3 Encounters:  09/29/11 93%  08/11/11 96%  08/04/10 94%

## 2011-09-29 NOTE — Patient Instructions (Addendum)
Take all new medications as prescribed - the muscle relaxer OK to re-start the spiriva as you plan Continue all other medications as before

## 2011-09-30 ENCOUNTER — Ambulatory Visit: Payer: BC Managed Care – PPO | Admitting: Internal Medicine

## 2011-10-01 ENCOUNTER — Encounter: Payer: Self-pay | Admitting: Internal Medicine

## 2011-10-01 DIAGNOSIS — G4762 Sleep related leg cramps: Secondary | ICD-10-CM | POA: Insufficient documentation

## 2011-10-01 NOTE — Assessment & Plan Note (Signed)
stable overall by hx and exam, most recent data reviewed with pt, and pt to continue medical treatment as before, to stay on the statin for now  Lab Results  Component Value Date   LDLCALC 87 07/23/2009

## 2011-10-01 NOTE — Assessment & Plan Note (Signed)
For flexeril qhs prn,  to f/u any worsening symptoms or concerns,  Lab Results  Component Value Date   WBC 3.8* 08/01/2011   HGB 15.5* 08/01/2011   HCT 46.0 08/01/2011   PLT 191.0 08/01/2011   GLUCOSE 88 08/01/2011   CHOL 287* 08/01/2011   TRIG 205.0* 08/01/2011   HDL 57.20 08/01/2011   LDLDIRECT 207.3 08/01/2011   LDLCALC 87 07/23/2009   ALT 19 08/01/2011   AST 24 08/01/2011   NA 140 08/01/2011   K 5.2* 08/01/2011   CL 103 08/01/2011   CREATININE 0.8 08/01/2011   BUN 26* 08/01/2011   CO2 28 08/01/2011   TSH 1.71 08/01/2011   INR 3.7 09/29/2011   Mild to mod,  to f/u any worsening symptoms or concerns

## 2011-10-01 NOTE — Assessment & Plan Note (Signed)
stable overall by hx and exam, most recent data reviewed with pt, and pt to continue medical treatment as before BP Readings from Last 3 Encounters:  09/29/11 118/62  08/31/11 130/80  08/11/11 120/62

## 2011-10-13 ENCOUNTER — Ambulatory Visit (INDEPENDENT_AMBULATORY_CARE_PROVIDER_SITE_OTHER): Payer: BC Managed Care – PPO | Admitting: Critical Care Medicine

## 2011-10-13 ENCOUNTER — Encounter: Payer: Self-pay | Admitting: Critical Care Medicine

## 2011-10-13 VITALS — BP 120/68 | HR 82 | Temp 97.6°F | Ht 63.0 in | Wt 159.0 lb

## 2011-10-13 DIAGNOSIS — J8489 Other specified interstitial pulmonary diseases: Secondary | ICD-10-CM

## 2011-10-13 DIAGNOSIS — J84116 Cryptogenic organizing pneumonia: Secondary | ICD-10-CM

## 2011-10-13 MED ORDER — PREDNISONE 10 MG PO TABS: ORAL_TABLET | ORAL | Status: DC

## 2011-10-13 NOTE — Assessment & Plan Note (Signed)
Bilateral patchy airspace disease c/w Boop Doubt CA Plan No need for PET  Pulse pred Stay on spiriva rov 1 month with repeat CXR

## 2011-10-13 NOTE — Patient Instructions (Signed)
Prednisone 10mg  Take 4 for three days 3 for three days 2 for three days 1 for three days and stop Stay on Spiriva  Return 1 month

## 2011-10-13 NOTE — Progress Notes (Signed)
Subjective:    Patient ID: [name], female    DOB: 1941-06-27, 70 y.o.   MRN: [id number]  HPI Comments: Last seen 2009 had PNA and COPD. Was in hosp then . Since then, 8/12: bronchitis spell,  Recurred 9/12.  Rx keflex and PNA 10/12. CXR abn. Expanded ABX,  Another CXR abn and had CT chest >>>???PET scan but then referred to pulm.  Urgent Care was directing care to this point. Now off all abx.  Now back on spiriva one week ? If helps.  Was off before.  Shortness of Breath This is a chronic problem. The current episode started more than 1 month ago. The problem occurs daily (exertional ). The problem has been gradually worsening. Associated symptoms include chest pain, hemoptysis and wheezing. Pertinent negatives include no abdominal pain, ear pain, fever, headaches, leg pain, leg swelling, neck pain, orthopnea, PND, rash, rhinorrhea, sore throat, sputum production, swollen glands or vomiting. The symptoms are aggravated by occupational exposure and any activity. Associated symptoms comments: Notes LLL chest pain No cough  Hemoptysis with abx and coumadin interaction. She has tried beta agonist inhalers and ipratropium inhalers for the symptoms. The treatment provided moderate relief. Her past medical history is significant for chronic lung disease, COPD and pneumonia. There is no history of allergies, aspirin allergies, asthma, bronchiolitis, CAD, DVT, a heart failure or PE.   70 y.o.F   Review of Systems  Constitutional: Negative for fever, chills, diaphoresis, activity change, appetite change, fatigue and unexpected weight change.  HENT: Negative for hearing loss, ear pain, nosebleeds, congestion, sore throat, facial swelling, rhinorrhea, sneezing, mouth sores, trouble swallowing, neck pain, neck stiffness, dental problem, voice change, postnasal drip, sinus pressure, tinnitus and ear discharge.   Eyes: Negative for photophobia, discharge, itching and visual disturbance.  Respiratory:  Positive for hemoptysis, shortness of breath and wheezing. Negative for apnea, cough, sputum production, choking, chest tightness and stridor.   Cardiovascular: Positive for chest pain. Negative for palpitations, orthopnea, leg swelling and PND.  Gastrointestinal: Negative for nausea, vomiting, abdominal pain, constipation, blood in stool and abdominal distention.  Genitourinary: Negative for dysuria, urgency, frequency, hematuria, flank pain, decreased urine volume and difficulty urinating.  Musculoskeletal: Negative for myalgias, back pain, joint swelling, arthralgias and gait problem.  Skin: Negative for color change, pallor and rash.  Neurological: Negative for dizziness, tremors, seizures, syncope, speech difficulty, weakness, light-headedness, numbness and headaches.  Hematological: Negative for adenopathy. Does not bruise/bleed easily.  Psychiatric/Behavioral: Negative for confusion, sleep disturbance and agitation. The patient is not nervous/anxious.        Objective:   Physical Exam    Filed Vitals:   10/13/11 1502  BP: 120/68  Pulse: 82  Temp: 97.6 F (36.4 C)  TempSrc: Oral  Height: 5\' 3"  (1.6 m)  Weight: 159 lb (72.122 kg)  SpO2: 96%    Gen: Pleasant, well-nourished, in no distress,  normal affect  ENT: No lesions,  mouth clear,  oropharynx clear, no postnasal drip  Neck: No JVD, no TMG, no carotid bruits  Lungs: No use of accessory muscles, no dullness to percussion, clear without rales or rhonchi  Cardiovascular: RRR, heart sounds normal, no murmur or gallops, no peripheral edema  Abdomen: soft and NT, no HSM,  BS normal  Musculoskeletal: No deformities, no cyanosis or clubbing  Neuro: alert, non focal  Skin: Warm, no lesions or rashes  CT scan; Patchy bilat airspace disease     Assessment & Plan:   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 albuterol (PROAIR HFA) 108 (90 BASE) MCG/ACT inhaler Inhale into the lungs. 1-2 puffs every 4-6 hours as needed       . atorvastatin (LIPITOR) 10 MG tablet Take 1 tablet (10 mg total) by mouth daily.  90 tablet  3  . azelastine (ASTELIN) 137 MCG/SPRAY nasal spray Place 1 spray into the nose 2 (two) times daily as needed. As needed alternate with singulair        . Calcium Carbonate-Vitamin D (CALCIUM + D PO) Take by mouth.        . celecoxib (CELEBREX) 100 MG capsule Take 100 mg by mouth daily as needed.        . chlorpheniramine-HYDROcodone (TUSSIONEX PENNKINETIC ER) 10-8 MG/5ML LQCR Take 5 mLs by mouth 2 (two) times daily as needed.        . cyclobenzaprine (FLEXERIL) 5 MG tablet 1-2 tabs by mouth at night for leg cramps as needed  60 tablet  2  . Estradiol (ESTROGEL) 0.75 MG/1.25 GM (0.06%) topical gel Place 1.25 g onto the skin as directed.  1 Bottle  12  . fluocinonide (LIDEX) 0.05 % cream Apply topically 2 (two) times daily as needed. Use as directed twice per day as needed  30 g  1  . montelukast (SINGULAIR) 10 MG tablet Take 10 mg by mouth daily as needed.        . Multiple Vitamin (MULTIVITAMIN) tablet Take 1 tablet by mouth daily.        Marland Kitchen omeprazole (PRILOSEC) 20 MG capsule TAKE 2 CAPSULES BY MOUTH ONCE A DAY  180 capsule  2  . scopolamine (TRANSDERM-SCOP) 1.5 MG Place 1 patch onto the skin as needed.       . tiotropium (SPIRIVA HANDIHALER) 18 MCG inhalation capsule Place 1 capsule (18 mcg total) into inhaler and inhale daily.  30 capsule  12  . warfarin (COUMADIN) 5 MG tablet TAKE AS DIRECTED BY COUMADIN CLINIC.  90 tablet  1  . amLODipine (NORVASC) 5 MG tablet Take 1 tablet (5 mg total) by mouth daily.  90 tablet  3  . predniSONE (DELTASONE) 10 MG tablet Take 4 for three days 3 for three days 2 for three days 1 for three days and  stop  30 tablet  0  . valsartan (DIOVAN) 160 MG tablet Take 1 tablet (160 mg total) by mouth daily.  90 tablet  3

## 2011-10-14 ENCOUNTER — Telehealth: Payer: Self-pay | Admitting: *Deleted

## 2011-10-14 NOTE — Telephone Encounter (Signed)
Pt called to inform us that she started Predisone dose tapering pak on today. 40mg s x3 days, 30mg s x3 days and 20mg s x3 days. Appt s/c for Tuesday.

## 2011-10-18 ENCOUNTER — Ambulatory Visit (INDEPENDENT_AMBULATORY_CARE_PROVIDER_SITE_OTHER): Payer: BC Managed Care – PPO | Admitting: *Deleted

## 2011-10-18 DIAGNOSIS — Z7901 Long term (current) use of anticoagulants: Secondary | ICD-10-CM

## 2011-10-18 DIAGNOSIS — Z8679 Personal history of other diseases of the circulatory system: Secondary | ICD-10-CM

## 2011-10-18 DIAGNOSIS — G459 Transient cerebral ischemic attack, unspecified: Secondary | ICD-10-CM

## 2011-10-18 LAB — POCT INR: INR: 4.5

## 2011-10-20 ENCOUNTER — Encounter: Payer: BC Managed Care – PPO | Admitting: *Deleted

## 2011-10-31 ENCOUNTER — Other Ambulatory Visit: Payer: Self-pay

## 2011-10-31 MED ORDER — ESTRADIOL 0.75 MG/1.25 GM (0.06%) TD GEL: 1.2500 g | TRANSDERMAL | Status: DC

## 2011-11-01 ENCOUNTER — Ambulatory Visit (INDEPENDENT_AMBULATORY_CARE_PROVIDER_SITE_OTHER): Payer: Medicare Other | Admitting: *Deleted

## 2011-11-01 DIAGNOSIS — Z8679 Personal history of other diseases of the circulatory system: Secondary | ICD-10-CM

## 2011-11-01 DIAGNOSIS — G459 Transient cerebral ischemic attack, unspecified: Secondary | ICD-10-CM

## 2011-11-01 DIAGNOSIS — Z7901 Long term (current) use of anticoagulants: Secondary | ICD-10-CM

## 2011-11-01 LAB — POCT INR: INR: 3.1

## 2011-11-09 ENCOUNTER — Encounter: Payer: Self-pay | Admitting: Critical Care Medicine

## 2011-11-09 ENCOUNTER — Ambulatory Visit (INDEPENDENT_AMBULATORY_CARE_PROVIDER_SITE_OTHER): Payer: Medicare Other | Admitting: Critical Care Medicine

## 2011-11-09 ENCOUNTER — Ambulatory Visit (INDEPENDENT_AMBULATORY_CARE_PROVIDER_SITE_OTHER)
Admission: RE | Admit: 2011-11-09 | Discharge: 2011-11-09 | Disposition: A | Discharge status: Home / Self Care | Payer: Medicare Other | Source: Ambulatory Visit | Attending: Critical Care Medicine | Admitting: Critical Care Medicine

## 2011-11-09 VITALS — BP 140/82 | HR 86 | Temp 97.4°F | Ht 63.0 in | Wt 162.0 lb

## 2011-11-09 DIAGNOSIS — J84116 Cryptogenic organizing pneumonia: Secondary | ICD-10-CM

## 2011-11-09 DIAGNOSIS — J8489 Other specified interstitial pulmonary diseases: Secondary | ICD-10-CM

## 2011-11-09 DIAGNOSIS — J449 Chronic obstructive pulmonary disease, unspecified: Secondary | ICD-10-CM

## 2011-11-09 MED ORDER — BUDESONIDE-FORMOTEROL FUMARATE 160-4.5 MCG/ACT IN AERO: 2.0000 | INHALATION_SPRAY | Freq: Two times a day (BID) | RESPIRATORY_TRACT | Status: DC

## 2011-11-09 MED ORDER — AZELASTINE HCL 0.15 % NA SOLN: 2.0000 | Freq: Every day | NASAL | Status: DC

## 2011-11-09 NOTE — Patient Instructions (Signed)
Obtain full lung function testing STop spiriva when current supply runs out Start Symbicort two puff twice daily, use samples Use Astepro two sprays each nostril daily, use samples Return 6 weeks

## 2011-11-09 NOTE — Progress Notes (Signed)
Subjective:    Patient ID: [name], female    DOB: 1941-01-11, 70 y.o.   MRN: [id number]  HPI Comments: Last seen 2009 had PNA and COPD. Was in hosp then . Since then, 8/12: bronchitis spell,  Recurred 9/12.  Rx keflex and PNA 10/12. CXR abn. Expanded ABX,  Another CXR abn and had CT chest >>>???PET scan but then referred to pulm.  Urgent Care was directing care to this point. Now off all abx.  Now back on spiriva one week ? If helps.  Was off before.  70 y.o.F  12/5 At last ov we rec pulse pred and spiriva.  No PET.  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 Still notes dyspnea daily and still gasps for breath.  Can get air in cannot expell Will wheeze. Uses the spiriva.  Prednisone was an issue with the INR.   No real cough. No mucus.  No real blood.   Review of Systems  Constitutional: Negative for chills, diaphoresis, activity change, appetite change, fatigue and unexpected weight change.  HENT: Negative for hearing loss, nosebleeds, congestion, facial swelling, sneezing, mouth sores, trouble swallowing, neck stiffness, dental problem, voice change, postnasal drip, sinus pressure, tinnitus and ear discharge.   Eyes: Negative for photophobia, discharge, itching and visual disturbance.  Respiratory: Negative for apnea, cough, choking, chest tightness and stridor.   Cardiovascular: Negative for palpitations.  Gastrointestinal: Negative for nausea, constipation, blood in stool and abdominal distention.  Genitourinary: Negative for dysuria, urgency, frequency, hematuria, flank pain, decreased urine volume and difficulty urinating.  Musculoskeletal: Negative for myalgias, back pain, joint swelling, arthralgias and gait problem.  Skin: Negative for color change and pallor.  Neurological: Negative for dizziness, tremors, seizures, syncope, speech difficulty, weakness, light-headedness and numbness.  Hematological: Negative for adenopathy. Does not bruise/bleed easily.  Psychiatric/Behavioral: Negative  for confusion, sleep disturbance and agitation. The patient is not nervous/anxious.        Objective:   Physical Exam     Filed Vitals:   11/09/11 1554  BP: 140/82  Pulse: 86  Temp: 97.4 F (36.3 C)  TempSrc: Oral  Height: 5\' 3"  (1.6 m)  Weight: 162 lb (73.483 kg)  SpO2: 97%    Gen: Pleasant, well-nourished, in no distress,  normal affect  ENT: No lesions,  mouth clear,  oropharynx clear, no postnasal drip  Neck: No JVD, no TMG, no carotid bruits  Lungs: No use of accessory muscles, no dullness to percussion, clear without rales or rhonchi  Cardiovascular: RRR, heart sounds normal, no murmur or gallops, no peripheral edema  Abdomen: soft and NT, no HSM,  BS normal  Musculoskeletal: No deformities, no cyanosis or clubbing  Neuro: alert, non focal  Skin: Warm, no lesions or rashes  CT scan; Patchy bilat airspace disease     Assessment & Plan:   BOOP (bronchiolitis obliterans with organizing pneumonia) Pulmonary infiltrates/Boop has cleared on 12/5 CXR Plan No further steroids needed Needs better Bronchodilation  COPD Stop spiriva Start Symbicort, pt instructed as to proper use      Updated Medication List Outpatient Encounter Prescriptions as of 11/09/2011  Medication Sig Dispense Refill  . albuterol (PROAIR HFA) 108 (90 BASE) MCG/ACT inhaler Inhale into the lungs. 1-2 puffs every 4-6 hours as needed       . atorvastatin (LIPITOR) 10 MG tablet Take 1 tablet (10 mg total) by mouth daily.  90 tablet  3  . Calcium Carbonate-Vitamin D (CALCIUM + D PO) Take 1 tablet by mouth 2 (two) times daily.       Marland Kitchen  celecoxib (CELEBREX) 100 MG capsule Take 100 mg by mouth daily as needed.        . chlorpheniramine-HYDROcodone (TUSSIONEX PENNKINETIC ER) 10-8 MG/5ML LQCR Take 5 mLs by mouth 2 (two) times daily as needed.        . cyclobenzaprine (FLEXERIL) 5 MG tablet 1-2 tabs by mouth at night for leg cramps as needed  60 tablet  2  . Estradiol (ESTROGEL) 0.75 MG/1.25 GM  (0.06%) topical gel Place 1.25 g onto the skin as directed. PT. CAN NOT HAVE A 90 DAY SUPPLY. SHE IS VERY OVERDUE FOR HER YEARLY EXAM AND SHE HAS NOT SET UP AN APPOINTMENT.  1 Bottle  0  . fluocinonide (LIDEX) 0.05 % cream Apply topically 2 (two) times daily as needed. Use as directed twice per day as needed  30 g  1  . montelukast (SINGULAIR) 10 MG tablet Take 10 mg by mouth daily as needed.        . Multiple Vitamin (MULTIVITAMIN) tablet Take 1 tablet by mouth daily.        Marland Kitchen omeprazole (PRILOSEC) 20 MG capsule TAKE 2 CAPSULES BY MOUTH ONCE A DAY  180 capsule  2  . warfarin (COUMADIN) 5 MG tablet TAKE AS DIRECTED BY COUMADIN CLINIC.  90 tablet  1  . DISCONTD: azelastine (ASTELIN) 137 MCG/SPRAY nasal spray Place 1 spray into the nose 2 (two) times daily as needed. As needed alternate with singulair        . DISCONTD: tiotropium (SPIRIVA HANDIHALER) 18 MCG inhalation capsule Place 1 capsule (18 mcg total) into inhaler and inhale daily.  30 capsule  12  . amLODipine (NORVASC) 5 MG tablet Take 1 tablet (5 mg total) by mouth daily.  90 tablet  3  . Azelastine HCl (ASTEPRO) 0.15 % SOLN Place 2 puffs into the nose daily.  30 mL  6  . budesonide-formoterol (SYMBICORT) 160-4.5 MCG/ACT inhaler Inhale 2 puffs into the lungs 2 (two) times daily.  1 Inhaler  3  . scopolamine (TRANSDERM-SCOP) 1.5 MG Place 1 patch onto the skin as needed.       . valsartan (DIOVAN) 160 MG tablet Take 1 tablet (160 mg total) by mouth daily.  90 tablet  3  . DISCONTD: predniSONE (DELTASONE) 10 MG tablet Take 4 for three days 3 for three days 2 for three days 1 for three days and stop  30 tablet  0

## 2011-11-10 NOTE — Assessment & Plan Note (Signed)
Pulmonary infiltrates/Boop has cleared on 12/5 CXR Plan No further steroids needed Needs better Bronchodilation

## 2011-11-10 NOTE — Assessment & Plan Note (Signed)
Stop spiriva Start Symbicort, pt instructed as to proper use

## 2011-11-14 ENCOUNTER — Ambulatory Visit: Payer: BC Managed Care – PPO | Admitting: Critical Care Medicine

## 2011-11-22 ENCOUNTER — Encounter: Payer: Medicare Other | Admitting: *Deleted

## 2011-11-23 ENCOUNTER — Ambulatory Visit (INDEPENDENT_AMBULATORY_CARE_PROVIDER_SITE_OTHER): Payer: Medicare Other | Admitting: *Deleted

## 2011-11-23 DIAGNOSIS — G459 Transient cerebral ischemic attack, unspecified: Secondary | ICD-10-CM

## 2011-11-23 DIAGNOSIS — Z7901 Long term (current) use of anticoagulants: Secondary | ICD-10-CM

## 2011-11-23 DIAGNOSIS — Z8679 Personal history of other diseases of the circulatory system: Secondary | ICD-10-CM

## 2011-11-23 LAB — POCT INR: INR: 3

## 2011-12-06 DIAGNOSIS — J189 Pneumonia, unspecified organism: Secondary | ICD-10-CM | POA: Insufficient documentation

## 2011-12-06 HISTORY — DX: Pneumonia, unspecified organism: J18.9

## 2011-12-14 ENCOUNTER — Telehealth: Payer: Self-pay

## 2011-12-14 MED ORDER — CYCLOBENZAPRINE HCL 5 MG PO TABS: ORAL_TABLET | ORAL | Status: DC

## 2011-12-14 NOTE — Telephone Encounter (Signed)
rx re-done escript

## 2011-12-14 NOTE — Telephone Encounter (Signed)
Pt called stating that her new prescription drug plan requires Flexeril Rx to say "HCL" or is will cost her full price. Pt is requesting a new Rx with this information included, please advise.

## 2011-12-15 ENCOUNTER — Other Ambulatory Visit: Payer: Self-pay

## 2011-12-15 MED ORDER — CYCLOBENZAPRINE HCL 5 MG PO TABS: ORAL_TABLET | ORAL | Status: DC

## 2011-12-22 ENCOUNTER — Ambulatory Visit (INDEPENDENT_AMBULATORY_CARE_PROVIDER_SITE_OTHER): Payer: Medicare Other | Admitting: *Deleted

## 2011-12-22 DIAGNOSIS — Z8679 Personal history of other diseases of the circulatory system: Secondary | ICD-10-CM

## 2011-12-22 DIAGNOSIS — G459 Transient cerebral ischemic attack, unspecified: Secondary | ICD-10-CM

## 2011-12-22 DIAGNOSIS — Z7901 Long term (current) use of anticoagulants: Secondary | ICD-10-CM

## 2011-12-22 LAB — POCT INR: INR: 3.3

## 2011-12-28 ENCOUNTER — Ambulatory Visit (INDEPENDENT_AMBULATORY_CARE_PROVIDER_SITE_OTHER): Payer: Medicare Other | Admitting: Critical Care Medicine

## 2011-12-28 ENCOUNTER — Encounter: Payer: Self-pay | Admitting: Critical Care Medicine

## 2011-12-28 DIAGNOSIS — J84116 Cryptogenic organizing pneumonia: Secondary | ICD-10-CM

## 2011-12-28 DIAGNOSIS — J449 Chronic obstructive pulmonary disease, unspecified: Secondary | ICD-10-CM

## 2011-12-28 DIAGNOSIS — J8489 Other specified interstitial pulmonary diseases: Secondary | ICD-10-CM

## 2011-12-28 LAB — PULMONARY FUNCTION TEST

## 2011-12-28 MED ORDER — BUDESONIDE-FORMOTEROL FUMARATE 160-4.5 MCG/ACT IN AERO: 2.0000 | INHALATION_SPRAY | Freq: Two times a day (BID) | RESPIRATORY_TRACT | Status: DC

## 2011-12-28 NOTE — Patient Instructions (Addendum)
Stay on symbicort two puff twice daily, use samples to help  Stop astelin/astepro Stop singulair Return 4 months

## 2011-12-28 NOTE — Progress Notes (Signed)
PFT done today. 

## 2011-12-28 NOTE — Progress Notes (Signed)
Subjective:    Patient ID: [name], female    DOB: Jul 07, 1941, 71 y.o.   MRN: [id number]  HPI 71 y.o.F  12/5 At last ov we rec pulse pred and spiriva.  No PET.  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 Still notes dyspnea daily and still gasps for breath.  Can get air in cannot expell Will wheeze. Uses the spiriva.  Prednisone was an issue with the INR.   No real cough. No mucus.  No real blood.    1/23 At last OV we stopped spiriva and started symbicort two puff bid Seems better.    Not as much wheeze.   Notes more pndrip   Past Medical History  Diagnosis Date  . SBE (subacute bacterial endocarditis) prophylaxis candidate   . COPD (chronic obstructive pulmonary disease)   . GERD (gastroesophageal reflux disease)   . Fibrocystic breast disease   . HLD (hyperlipidemia)   . Osteoarthritis   . IBS (irritable bowel syndrome)   . Hemorrhoids   . History of cerebrovascular accident   . TIA (transient ischemic attack)     anticoagulation therapy on coumadin  . Nephrolithiasis     hx  . HTN (hypertension)   . Stroke     STROKES  . DUB (dysfunctional uterine bleeding)   . Atrophic vaginitis   . Menopausal symptoms   . DI (detrusor instability)   . Basal cell carcinoma      Family History  Problem Relation Age of Onset  . COPD Mother   . Breast cancer Mother   . Uterine cancer Mother   . Cancer Mother     OV/UTERINE     History   Social History  . Marital Status: Married    Spouse Name: N/A    Number of Children: 2  . Years of Education: N/A   Occupational History  .     Social History Main Topics  . Smoking status: Former Smoker -- 2.0 packs/day for 30 years    Types: Cigarettes    Quit date: 12/06/1995  . Smokeless tobacco: Never Used  . Alcohol Use: Yes     occas  . Drug Use: No  . Sexually Active: Yes    Birth Control/ Protection: Surgical   Other Topics Concern  . Not on file   Social History Narrative   Retired- Chemical engineer.       Allergies  Allergen Reactions  . Biaxin   . Clarithromycin     REACTION: possible rash but could have been legionarres dz with rash  . Crestor (Rosuvastatin Calcium)     fagitue and joint pain     Outpatient Prescriptions Prior to Visit  Medication Sig Dispense Refill  . albuterol (PROAIR HFA) 108 (90 BASE) MCG/ACT inhaler Inhale into the lungs. 1-2 puffs every 4-6 hours as needed       . amLODipine (NORVASC) 5 MG tablet Take 1 tablet (5 mg total) by mouth daily.  90 tablet  3  . atorvastatin (LIPITOR) 10 MG tablet Take 1 tablet (10 mg total) by mouth daily.  90 tablet  3  . Calcium Carbonate-Vitamin D (CALCIUM + D PO) Take 1 tablet by mouth 2 (two) times daily.       . celecoxib (CELEBREX) 100 MG capsule Take 100 mg by mouth daily.       . chlorpheniramine-HYDROcodone (TUSSIONEX PENNKINETIC ER) 10-8 MG/5ML LQCR Take 5 mLs by mouth 2 (two) times daily as needed.        Marland Kitchen  cyclobenzaprine (FLEXERIL) 5 MG tablet 1-2 tabs at night as needed for cramps  180 tablet  3  . Estradiol (ESTROGEL) 0.75 MG/1.25 GM (0.06%) topical gel Place 1.25 g onto the skin as directed. PT. CAN NOT HAVE A 90 DAY SUPPLY. SHE IS VERY OVERDUE FOR HER YEARLY EXAM AND SHE HAS NOT SET UP AN APPOINTMENT.  1 Bottle  0  . fluocinonide (LIDEX) 0.05 % cream Apply topically 2 (two) times daily as needed. Use as directed twice per day as needed  30 g  1  . Multiple Vitamin (MULTIVITAMIN) tablet Take 1 tablet by mouth daily.        Marland Kitchen omeprazole (PRILOSEC) 20 MG capsule TAKE 2 CAPSULES BY MOUTH ONCE A DAY  180 capsule  2  . scopolamine (TRANSDERM-SCOP) 1.5 MG Place 1 patch onto the skin as needed.       . valsartan (DIOVAN) 160 MG tablet Take 1 tablet (160 mg total) by mouth daily.  90 tablet  3  . warfarin (COUMADIN) 5 MG tablet TAKE AS DIRECTED BY COUMADIN CLINIC.  90 tablet  1  . Azelastine HCl (ASTEPRO) 0.15 % SOLN Place 2 puffs into the nose daily.  30 mL  6  . budesonide-formoterol (SYMBICORT) 160-4.5 MCG/ACT inhaler  Inhale 2 puffs into the lungs 2 (two) times daily.  1 Inhaler  3  . montelukast (SINGULAIR) 10 MG tablet Take 10 mg by mouth daily as needed.            Review of Systems  Constitutional: Negative for chills, diaphoresis, activity change, appetite change, fatigue and unexpected weight change.  HENT: Negative for hearing loss, nosebleeds, congestion, facial swelling, sneezing, mouth sores, trouble swallowing, neck stiffness, dental problem, voice change, postnasal drip, sinus pressure, tinnitus and ear discharge.   Eyes: Negative for photophobia, discharge, itching and visual disturbance.  Respiratory: Negative for apnea, cough, choking, chest tightness and stridor.   Cardiovascular: Negative for palpitations.  Gastrointestinal: Negative for nausea, constipation, blood in stool and abdominal distention.  Genitourinary: Negative for dysuria, urgency, frequency, hematuria, flank pain, decreased urine volume and difficulty urinating.  Musculoskeletal: Negative for myalgias, back pain, joint swelling, arthralgias and gait problem.  Skin: Negative for color change and pallor.  Neurological: Negative for dizziness, tremors, seizures, syncope, speech difficulty, weakness, light-headedness and numbness.  Hematological: Negative for adenopathy. Does not bruise/bleed easily.  Psychiatric/Behavioral: Negative for confusion, sleep disturbance and agitation. The patient is not nervous/anxious.        Objective:   Physical Exam     Filed Vitals:   12/28/11 1541  BP: 140/70  Pulse: 88  Temp: 97.6 F (36.4 C)  TempSrc: Oral  Height: 5\' 4"  (1.626 m)  Weight: 161 lb (73.029 kg)  SpO2: 94%    Gen: Pleasant, well-nourished, in no distress,  normal affect  ENT: No lesions,  mouth clear,  oropharynx clear, no postnasal drip  Neck: No JVD, no TMG, no carotid bruits  Lungs: No use of accessory muscles, no dullness to percussion, clear without rales or rhonchi  Cardiovascular: RRR, heart sounds  normal, no murmur or gallops, no peripheral edema  Abdomen: soft and NT, no HSM,  BS normal  Musculoskeletal: No deformities, no cyanosis or clubbing  Neuro: alert, non focal  Skin: Warm, no lesions or rashes  FeV1 62%  FVC 104%  DLCO 69%  TLC 129%  12/28/11     Assessment & Plan:   COPD Moderate to severe Copd with recent exacerbation now better Plan Stay  on symbicort two puff twice daily, use samples to help  Stop astelin/astepro Stop singulair Return 4 months      Updated Medication List Outpatient Encounter Prescriptions as of 12/28/2011  Medication Sig Dispense Refill  . albuterol (PROAIR HFA) 108 (90 BASE) MCG/ACT inhaler Inhale into the lungs. 1-2 puffs every 4-6 hours as needed       . amLODipine (NORVASC) 5 MG tablet Take 1 tablet (5 mg total) by mouth daily.  90 tablet  3  . atorvastatin (LIPITOR) 10 MG tablet Take 1 tablet (10 mg total) by mouth daily.  90 tablet  3  . budesonide-formoterol (SYMBICORT) 160-4.5 MCG/ACT inhaler Inhale 2 puffs into the lungs 2 (two) times daily.  1 Inhaler  5  . Calcium Carbonate-Vitamin D (CALCIUM + D PO) Take 1 tablet by mouth 2 (two) times daily.       . celecoxib (CELEBREX) 100 MG capsule Take 100 mg by mouth daily.       . chlorpheniramine-HYDROcodone (TUSSIONEX PENNKINETIC ER) 10-8 MG/5ML LQCR Take 5 mLs by mouth 2 (two) times daily as needed.        . cyclobenzaprine (FLEXERIL) 5 MG tablet 1-2 tabs at night as needed for cramps  180 tablet  3  . Estradiol (ESTROGEL) 0.75 MG/1.25 GM (0.06%) topical gel Place 1.25 g onto the skin as directed. PT. CAN NOT HAVE A 90 DAY SUPPLY. SHE IS VERY OVERDUE FOR HER YEARLY EXAM AND SHE HAS NOT SET UP AN APPOINTMENT.  1 Bottle  0  . fluocinonide (LIDEX) 0.05 % cream Apply topically 2 (two) times daily as needed. Use as directed twice per day as needed  30 g  1  . Multiple Vitamin (MULTIVITAMIN) tablet Take 1 tablet by mouth daily.        Marland Kitchen omeprazole (PRILOSEC) 20 MG capsule TAKE 2 CAPSULES BY  MOUTH ONCE A DAY  180 capsule  2  . scopolamine (TRANSDERM-SCOP) 1.5 MG Place 1 patch onto the skin as needed.       . valsartan (DIOVAN) 160 MG tablet Take 1 tablet (160 mg total) by mouth daily.  90 tablet  3  . warfarin (COUMADIN) 5 MG tablet TAKE AS DIRECTED BY COUMADIN CLINIC.  90 tablet  1  . DISCONTD: Azelastine HCl (ASTEPRO) 0.15 % SOLN Place 2 puffs into the nose daily.  30 mL  6  . DISCONTD: budesonide-formoterol (SYMBICORT) 160-4.5 MCG/ACT inhaler Inhale 2 puffs into the lungs 2 (two) times daily.  1 Inhaler  3  . DISCONTD: montelukast (SINGULAIR) 10 MG tablet Take 10 mg by mouth daily as needed.

## 2011-12-29 NOTE — Assessment & Plan Note (Signed)
Moderate to severe Copd with recent exacerbation now better Plan Stay on symbicort two puff twice daily, use samples to help  Stop astelin/astepro Stop singulair Return 4 months

## 2012-01-09 ENCOUNTER — Encounter: Payer: Self-pay | Admitting: Critical Care Medicine

## 2012-01-16 ENCOUNTER — Other Ambulatory Visit: Payer: Self-pay | Admitting: *Deleted

## 2012-01-16 MED ORDER — WARFARIN SODIUM 5 MG PO TABS: 5.0000 mg | ORAL_TABLET | Freq: Every day | ORAL | Status: DC

## 2012-01-19 ENCOUNTER — Encounter: Payer: Medicare Other | Admitting: *Deleted

## 2012-01-20 ENCOUNTER — Ambulatory Visit (INDEPENDENT_AMBULATORY_CARE_PROVIDER_SITE_OTHER): Payer: Medicare Other | Admitting: Pharmacist

## 2012-01-20 DIAGNOSIS — Z8679 Personal history of other diseases of the circulatory system: Secondary | ICD-10-CM

## 2012-01-20 DIAGNOSIS — G459 Transient cerebral ischemic attack, unspecified: Secondary | ICD-10-CM

## 2012-01-20 DIAGNOSIS — Z7901 Long term (current) use of anticoagulants: Secondary | ICD-10-CM

## 2012-01-20 LAB — POCT INR: INR: 2.7

## 2012-01-20 MED ORDER — WARFARIN SODIUM 5 MG PO TABS: 5.0000 mg | ORAL_TABLET | Freq: Every day | ORAL | Status: DC

## 2012-02-13 ENCOUNTER — Ambulatory Visit (INDEPENDENT_AMBULATORY_CARE_PROVIDER_SITE_OTHER): Payer: Medicare Other | Admitting: Internal Medicine

## 2012-02-13 ENCOUNTER — Ambulatory Visit: Payer: BC Managed Care – PPO | Admitting: Internal Medicine

## 2012-02-13 ENCOUNTER — Other Ambulatory Visit (INDEPENDENT_AMBULATORY_CARE_PROVIDER_SITE_OTHER): Payer: Medicare Other

## 2012-02-13 ENCOUNTER — Encounter: Payer: Self-pay | Admitting: Internal Medicine

## 2012-02-13 VITALS — BP 120/60 | HR 68 | Temp 97.3°F | Ht 63.0 in | Wt 159.5 lb

## 2012-02-13 DIAGNOSIS — E785 Hyperlipidemia, unspecified: Secondary | ICD-10-CM

## 2012-02-13 DIAGNOSIS — G459 Transient cerebral ischemic attack, unspecified: Secondary | ICD-10-CM

## 2012-02-13 DIAGNOSIS — Z8679 Personal history of other diseases of the circulatory system: Secondary | ICD-10-CM

## 2012-02-13 DIAGNOSIS — J449 Chronic obstructive pulmonary disease, unspecified: Secondary | ICD-10-CM

## 2012-02-13 DIAGNOSIS — Z0000 Encounter for general adult medical examination without abnormal findings: Secondary | ICD-10-CM

## 2012-02-13 DIAGNOSIS — I10 Essential (primary) hypertension: Secondary | ICD-10-CM

## 2012-02-13 DIAGNOSIS — Z7901 Long term (current) use of anticoagulants: Secondary | ICD-10-CM

## 2012-02-13 DIAGNOSIS — Z79899 Other long term (current) drug therapy: Secondary | ICD-10-CM

## 2012-02-13 LAB — LIPID PANEL
Cholesterol: 219 mg/dL — ABNORMAL HIGH (ref 0–200)
HDL: 53.7 mg/dL (ref >39.00)
Total CHOL/HDL Ratio: 4
Triglycerides: 195 mg/dL — ABNORMAL HIGH (ref 0.0–149.0)
VLDL: 39 mg/dL (ref 0.0–40.0)

## 2012-02-13 LAB — HEPATIC FUNCTION PANEL
ALT: 22 U/L (ref 0–35)
AST: 27 U/L (ref 0–37)
Albumin: 4.1 g/dL (ref 3.5–5.2)
Alkaline Phosphatase: 67 U/L (ref 39–117)
Bilirubin, Direct: 0.1 mg/dL (ref 0.0–0.3)
Total Bilirubin: 0.4 mg/dL (ref 0.3–1.2)
Total Protein: 6.6 g/dL (ref 6.0–8.3)

## 2012-02-13 LAB — LDL CHOLESTEROL, DIRECT: Direct LDL: 132.7 mg/dL

## 2012-02-13 MED ORDER — IRBESARTAN 150 MG PO TABS: 150.0000 mg | ORAL_TABLET | Freq: Every day | ORAL | Status: DC

## 2012-02-13 MED ORDER — AMLODIPINE BESYLATE 5 MG PO TABS: 5.0000 mg | ORAL_TABLET | Freq: Every day | ORAL | Status: DC

## 2012-02-13 NOTE — Assessment & Plan Note (Signed)
Improved,  to f/u any worsening symptoms or concerns,   SpO2 Readings from Last 3 Encounters:  02/13/12 94%  12/28/11 94%  11/09/11 97%   Continue all other medications as before

## 2012-02-13 NOTE — Assessment & Plan Note (Signed)
stable overall by hx and exam, most recent data reviewed with pt, and pt to continue medical treatment as before  BP Readings from Last 3 Encounters:  02/13/12 120/60  12/28/11 140/70  11/09/11 140/82   To change the diovan to avapro due to insurance,  to f/u any worsening symptoms or concerns

## 2012-02-13 NOTE — Assessment & Plan Note (Signed)
stable overall by hx and exam, most recent data reviewed with pt, and pt to continue medical treatment as before  Lab Results  Component Value Date   LDLCALC 87 07/23/2009

## 2012-02-13 NOTE — Assessment & Plan Note (Signed)
Also for lft's on the new statin,  to f/u any worsening symptoms or concerns  

## 2012-02-13 NOTE — Patient Instructions (Signed)
OK to stop the diovan when it is run out Start the generic Avapro 150 mg per day after that Continue all other medications as before Your amlodipine and Avapro were sent to the 3 mo pharmacy You should be OK for surgury, but will need OK per Dr Tenny Craw and Coumadin clinic as well

## 2012-02-13 NOTE — Progress Notes (Signed)
Subjective:    Patient ID: [name], female    DOB: 1941-09-20, 71 y.o.   MRN: [id number]  HPI  Here to f/u with husband, overall doing ok after seeing pulm with mod copd exac recent;  Needs new statin /lipid f/u;  Pt denies chest pain, increased sob or doe, wheezing, orthopnea, PND, increased LE swelling, palpitations, dizziness or syncope.  Pt denies new neurological symptoms such as new headache, or facial or extremity weakness or numbness   Pt denies polydipsia, polyuria.  Needs diovan change to irbesartan due to insurance coverage.  Also for right shoulder surgury eventually for prob rotater cuff tear, but will need specialist and coumadin clinic preop as well.   Pt denies fever, wt loss, night sweats, loss of appetite, or other constitutional symptoms  Denies worsening depressive symptoms, suicidal ideation, or panic Past Medical History  Diagnosis Date  . SBE (subacute bacterial endocarditis) prophylaxis candidate   . COPD (chronic obstructive pulmonary disease)   . GERD (gastroesophageal reflux disease)   . Fibrocystic breast disease   . HLD (hyperlipidemia)   . Osteoarthritis   . IBS (irritable bowel syndrome)   . Hemorrhoids   . History of cerebrovascular accident   . TIA (transient ischemic attack)     anticoagulation therapy on coumadin  . Nephrolithiasis     hx  . HTN (hypertension)   . Stroke     STROKES  . DUB (dysfunctional uterine bleeding)   . Atrophic vaginitis   . Menopausal symptoms   . DI (detrusor instability)   . Basal cell carcinoma    Past Surgical History  Procedure Date  . Hysterectomy (other)   . Bladder suspension   . Excision of basal cell ca 2011  . Vaginal hysterectomy 1983    NO BSO-DR. MABRY  . Thumb surgery   . Rectocele repair 2008    A&P REPAIR -DR. MCDIARMID  . Anterior and posterior vaginal repair 2008    A&P REPAIR-DR MCDIARMID    reports that she quit smoking about 16 years ago. Her smoking use included Cigarettes. She has a 60  pack-year smoking history. She has never used smokeless tobacco. She reports that she drinks alcohol. She reports that she does not use illicit drugs. family history includes Breast cancer in her mother; COPD in her mother; Cancer in her mother; and Uterine cancer in her mother. Allergies  Allergen Reactions  . Biaxin   . Clarithromycin     REACTION: possible rash but could have been legionarres dz with rash  . Crestor (Rosuvastatin Calcium)     fagitue and joint pain   Current Outpatient Prescriptions on File Prior to Visit  Medication Sig Dispense Refill  . albuterol (PROAIR HFA) 108 (90 BASE) MCG/ACT inhaler Inhale into the lungs. 1-2 puffs every 4-6 hours as needed       . atorvastatin (LIPITOR) 10 MG tablet Take 1 tablet (10 mg total) by mouth daily.  90 tablet  3  . budesonide-formoterol (SYMBICORT) 160-4.5 MCG/ACT inhaler Inhale 2 puffs into the lungs 2 (two) times daily.  1 Inhaler  5  . Calcium Carbonate-Vitamin D (CALCIUM + D PO) Take 1 tablet by mouth 2 (two) times daily.       . celecoxib (CELEBREX) 100 MG capsule Take 100 mg by mouth daily.       . cyclobenzaprine (FLEXERIL) 5 MG tablet 1-2 tabs at night as needed for cramps  180 tablet  3  . Estradiol (ESTROGEL) 0.75 MG/1.25 GM (0.06%)  topical gel Place 1.25 g onto the skin as directed. PT. CAN NOT HAVE A 90 DAY SUPPLY. SHE IS VERY OVERDUE FOR HER YEARLY EXAM AND SHE HAS NOT SET UP AN APPOINTMENT.  1 Bottle  0  . fluocinonide (LIDEX) 0.05 % cream Apply topically 2 (two) times daily as needed. Use as directed twice per day as needed  30 g  1  . Multiple Vitamin (MULTIVITAMIN) tablet Take 1 tablet by mouth daily.        Marland Kitchen omeprazole (PRILOSEC) 20 MG capsule TAKE 2 CAPSULES BY MOUTH ONCE A DAY  180 capsule  2  . scopolamine (TRANSDERM-SCOP) 1.5 MG Place 1 patch onto the skin as needed.       . warfarin (COUMADIN) 5 MG tablet Take 1 tablet (5 mg total) by mouth daily.  90 tablet  1   Review of Systems Review of Systems    Constitutional: Negative for diaphoresis and unexpected weight change.  HENT: Negative for drooling and tinnitus.   Eyes: Negative for photophobia and visual disturbance.  Respiratory: Negative for choking and stridor.   Gastrointestinal: Negative for vomiting and blood in stool.  Genitourinary: Negative for hematuria and decreased urine volume.     Objective:   Physical Exam BP 120/60  Pulse 68  Temp(Src) 97.3 F (36.3 C) (Oral)  Ht 5\' 3"  (1.6 m)  Wt 159 lb 8 oz (72.349 kg)  BMI 28.25 kg/m2  SpO2 94% Physical Exam  VS noted Constitutional: Pt appears well-developed and well-nourished.  HENT: Head: Normocephalic.  Right Ear: External ear normal.  Left Ear: External ear normal.  Eyes: Conjunctivae and EOM are normal. Pupils are equal, round, and reactive to light.  Neck: Normal range of motion. Neck supple.  Cardiovascular: Normal rate and regular rhythm.   Pulmonary/Chest: Effort normal and breath sounds decreased, no rales or wheezes  Abd:  Soft, NT, non-distended, + BS Neurological: Pt is alert. No cranial nerve deficit.  Skin: Skin is warm. No erythema.  Psychiatric: Pt behavior is normal. Thought content normal.     Assessment & Plan:

## 2012-02-14 ENCOUNTER — Telehealth: Payer: Self-pay | Admitting: Internal Medicine

## 2012-02-14 MED ORDER — ATORVASTATIN CALCIUM 20 MG PO TABS: 20.0000 mg | ORAL_TABLET | Freq: Every day | ORAL | Status: DC

## 2012-02-14 NOTE — Telephone Encounter (Signed)
New lipitor 20 mg sent to [facility]

## 2012-02-14 NOTE — Telephone Encounter (Signed)
Patient informed. 

## 2012-02-17 ENCOUNTER — Encounter: Payer: Self-pay | Admitting: Physician Assistant

## 2012-02-17 ENCOUNTER — Ambulatory Visit (INDEPENDENT_AMBULATORY_CARE_PROVIDER_SITE_OTHER): Payer: Medicare Other | Admitting: Physician Assistant

## 2012-02-17 ENCOUNTER — Ambulatory Visit (INDEPENDENT_AMBULATORY_CARE_PROVIDER_SITE_OTHER): Payer: Medicare Other | Admitting: Pharmacist

## 2012-02-17 VITALS — BP 144/88 | HR 94 | Ht 63.0 in | Wt 159.1 lb

## 2012-02-17 DIAGNOSIS — I639 Cerebral infarction, unspecified: Secondary | ICD-10-CM

## 2012-02-17 DIAGNOSIS — Z8679 Personal history of other diseases of the circulatory system: Secondary | ICD-10-CM

## 2012-02-17 DIAGNOSIS — Z01810 Encounter for preprocedural cardiovascular examination: Secondary | ICD-10-CM

## 2012-02-17 DIAGNOSIS — I6350 Cerebral infarction due to unspecified occlusion or stenosis of unspecified cerebral artery: Secondary | ICD-10-CM

## 2012-02-17 DIAGNOSIS — I10 Essential (primary) hypertension: Secondary | ICD-10-CM

## 2012-02-17 DIAGNOSIS — Z7901 Long term (current) use of anticoagulants: Secondary | ICD-10-CM

## 2012-02-17 DIAGNOSIS — E785 Hyperlipidemia, unspecified: Secondary | ICD-10-CM

## 2012-02-17 DIAGNOSIS — G459 Transient cerebral ischemic attack, unspecified: Secondary | ICD-10-CM

## 2012-02-17 LAB — POCT INR: INR: 2.5

## 2012-02-17 NOTE — Progress Notes (Signed)
[address] [place], [place]  [address] Phone: [phone] Fax:  [phone]  Date:  02/17/2012   Name:  [name]       DOB:  February 19, 1941 MRN:  [id number]  PCP:  Dr. Jonny Ruiz Primary Cardiologist:  Dr. Dietrich Pates  Primary Electrophysiologist:  None    History of Present Illness: [name] is a 71 y.o. female who presents for surgical clearance.  She has not been seen in this office since 2008 by Dr. Tenny Craw.  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  She is on chronic Coumadin.  TEE 12/03: EF 55-65%, trace AI, mild MR, negative bubble study, positive atrial septal aneurysm.  She also has a history of hypertension, hyperlipidemia, GERD and COPD as well as venous insufficiency, status post prior ablative therapy by Dr. Arbie Cookey.  It has been noted in the past that she definitely needs cross bridging therapy anytime that she comes off of Coumadin.  She is considering having a right rotator cuff repair with Dr. Deberah Castle of [facility] in [place], [place].  Date is TBD.  She is very active and exercises on a treadmill 3x a week as well as an elliptical and lifts weights.  She sets the treadmill at 4.0 MPH and does this for 45 mins.  She does 15 minute miles.  She can definitely achieve greater than 4 METs.  The patient denies chest pain, shortness of breath, syncope, orthopnea, PND or significant pedal edema.   No bleeding problems.  BP up today but usually much better.   Past Medical History  Diagnosis Date  . SBE (subacute bacterial endocarditis) prophylaxis candidate   . COPD (chronic obstructive pulmonary disease)   . GERD (gastroesophageal reflux disease)   . Fibrocystic breast disease   . HLD (hyperlipidemia)   . Osteoarthritis   . IBS (irritable bowel syndrome)   . Hemorrhoids   . History of cerebrovascular accident   . TIA (transient ischemic attack)     anticoagulation therapy on coumadin  .  Nephrolithiasis     hx  . HTN (hypertension)   . Stroke     STROKES  . DUB (dysfunctional uterine bleeding)   . Atrophic vaginitis   . Menopausal symptoms   . DI (detrusor instability)   . Basal cell carcinoma     Current Outpatient Prescriptions  Medication Sig Dispense Refill  . albuterol (PROAIR HFA) 108 (90 BASE) MCG/ACT inhaler Inhale into the lungs. 1-2 puffs every 4-6 hours as needed       . amLODipine (NORVASC) 5 MG tablet Take 1 tablet (5 mg total) by mouth daily.  90 tablet  3  . atorvastatin (LIPITOR) 20 MG tablet Take 1 tablet (20 mg total) by mouth daily.  90 tablet  3  . budesonide-formoterol (SYMBICORT) 160-4.5 MCG/ACT inhaler Inhale 2 puffs into the lungs 2 (two) times daily.  1 Inhaler  5  . Calcium Carbonate-Vitamin D (CALCIUM + D PO) Take 1 tablet by mouth 2 (two) times daily.       . celecoxib (CELEBREX) 100 MG capsule Take 100 mg by mouth daily.       . cyclobenzaprine (FLEXERIL) 5 MG tablet 1-2 tabs at night as needed for cramps  180 tablet  3  . Estradiol (ESTROGEL) 0.75 MG/1.25 GM (0.06%) topical gel Place 1.25 g onto the skin as directed. PT. CAN NOT HAVE A 90 DAY SUPPLY. SHE  IS VERY OVERDUE FOR HER YEARLY EXAM AND SHE HAS NOT SET UP AN APPOINTMENT.  1 Bottle  0  . fluocinonide (LIDEX) 0.05 % cream Apply topically 2 (two) times daily as needed. Use as directed twice per day as needed  30 g  1  . irbesartan (AVAPRO) 150 MG tablet Take 1 tablet (150 mg total) by mouth daily.  90 tablet  3  . Multiple Vitamin (MULTIVITAMIN) tablet Take 1 tablet by mouth daily.        Marland Kitchen omeprazole (PRILOSEC) 20 MG capsule TAKE 2 CAPSULES BY MOUTH ONCE A DAY  180 capsule  2  . scopolamine (TRANSDERM-SCOP) 1.5 MG Place 1 patch onto the skin as needed.       . warfarin (COUMADIN) 5 MG tablet Take 1 tablet (5 mg total) by mouth daily.  90 tablet  1    Allergies: Allergies  Allergen Reactions  . Biaxin   . Clarithromycin     REACTION: possible rash but could have been legionarres dz  with rash  . Crestor (Rosuvastatin Calcium)     fagitue and joint pain    History  Substance Use Topics  . Smoking status: Former Smoker -- 2.0 packs/day for 30 years    Types: Cigarettes    Quit date: 12/06/1995  . Smokeless tobacco: Never Used  . Alcohol Use: Yes     occas     Family History  Problem Relation Age of Onset  . COPD Mother   . Breast cancer Mother   . Uterine cancer Mother   . Cancer Mother     OV/UTERINE     ROS:  Please see the history of present illness.    All other systems reviewed and negative.   PHYSICAL EXAM: VS:  BP 144/88  Pulse 94  Ht 5\' 3"  (1.6 m)  Wt 159 lb 1.9 oz (72.176 kg)  BMI 28.19 kg/m2 Well nourished, well developed, in no acute distress HEENT: normal Neck: no JVD Vasc: no carotid bruits Cardiac:  Distant S1, S2; RRR; no murmur Lungs:  clear to auscultation bilaterally, no wheezing, rhonchi or rales Abd: soft, nontender, no hepatomegaly Ext: no edema Skin: warm and dry Neuro:  CNs 2-12 intact, no focal abnormalities noted Psych: normal affect   EKG:  NSR, HR 94, normal axis, Q's in V1-3, PVCs, no change from prior tracings  ASSESSMENT AND PLAN:  1. Pre-operative cardiovascular examination  She has excellent exercise tolerance and has no unstable cardiac conditions.  No CAD risk equivalents.  The patient does not have any unstable cardiac conditions.  The patient can achieve 4 METs or greater without anginal symptoms.  According to [facility] and AHA guidelines, the patient requires no further cardiac workup prior to their noncardiac surgery.  The patient should be at acceptable risk.  Our service is available as necessary in the perioperative period.    2. Stroke  She needs Lovenox bridging and we will arrange through our coumadin clinic.  Follow up with Dr. Dietrich Pates in 12 mos.   3. HTN (hypertension)  Typically better controlled.  Monitor.     4. HYPERLIPIDEMIA  Managed by PCP.      Luna Glasgow, PA-C  8:50 AM  02/17/2012

## 2012-02-17 NOTE — Patient Instructions (Signed)
Your physician wants you to follow-up in: 12 MONTHS WITH DR. Tenny Craw. You will receive a reminder letter in the mail two months in advance. If you don't  receive a letter, please call our office to schedule the follow-up appointment.

## 2012-03-09 ENCOUNTER — Ambulatory Visit (INDEPENDENT_AMBULATORY_CARE_PROVIDER_SITE_OTHER)
Admission: RE | Admit: 2012-03-09 | Discharge: 2012-03-09 | Disposition: A | Discharge status: Home / Self Care | Payer: Medicare Other | Source: Ambulatory Visit | Attending: Critical Care Medicine | Admitting: Critical Care Medicine

## 2012-03-09 ENCOUNTER — Encounter: Payer: Self-pay | Admitting: Critical Care Medicine

## 2012-03-09 ENCOUNTER — Ambulatory Visit (INDEPENDENT_AMBULATORY_CARE_PROVIDER_SITE_OTHER): Payer: Medicare Other | Admitting: Critical Care Medicine

## 2012-03-09 VITALS — BP 152/90 | HR 98 | Temp 98.4°F | Ht 63.0 in | Wt 163.0 lb

## 2012-03-09 DIAGNOSIS — J209 Acute bronchitis, unspecified: Secondary | ICD-10-CM

## 2012-03-09 DIAGNOSIS — J449 Chronic obstructive pulmonary disease, unspecified: Secondary | ICD-10-CM

## 2012-03-09 MED ORDER — MOXIFLOXACIN HCL 400 MG PO TABS: 400.0000 mg | ORAL_TABLET | Freq: Every day | ORAL | Status: DC

## 2012-03-09 MED ORDER — BENZONATATE 100 MG PO CAPS: 100.0000 mg | ORAL_CAPSULE | Freq: Three times a day (TID) | ORAL | Status: AC | PRN

## 2012-03-09 MED ORDER — PREDNISONE 10 MG PO TABS: ORAL_TABLET | ORAL | Status: DC

## 2012-03-09 MED ORDER — HYDROCODONE-HOMATROPINE 5-1.5 MG/5ML PO SYRP: 5.0000 mL | ORAL_SOLUTION | Freq: Four times a day (QID) | ORAL | Status: DC | PRN

## 2012-03-09 NOTE — Patient Instructions (Signed)
Prednisone 10mg  Take 4 for two days three for two days two for two days one for two days Avelox one daily for 5 days Use Hycodan and tessalon per cough protocol Chest xray today Stay on symbicort Return 7-10days with Tammy Parrett for recheck

## 2012-03-09 NOTE — Progress Notes (Signed)
Subjective:    Patient ID: [name], female    DOB: [birth year], 71 y.o.   MRN: [id number]  HPI  71 y.o.F  12/5 At last ov we rec pulse pred and spiriva.  No PET.  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 Still notes dyspnea daily and still gasps for breath.  Can get air in cannot expell Will wheeze. Uses the spiriva.  Prednisone was an issue with the INR.   No real cough. No mucus.  No real blood.    1/23 At last OV we stopped spiriva and started symbicort two puff bid Seems better.    Not as much wheeze.   Notes more pndrip   4/5 Ill for 3weeks.  Severe cough, HAs,  Short of breath.  Difficulty raising mucus. Mucus still yellow On mucinex.  Rx Keflux 500 qid x 10day, 10mg  pred pulse taper over 10days Just finished this 5 days ago.   Past Medical History  Diagnosis Date  . SBE (subacute bacterial endocarditis) prophylaxis candidate   . COPD (chronic obstructive pulmonary disease)   . GERD (gastroesophageal reflux disease)   . Fibrocystic breast disease   . HLD (hyperlipidemia)   . Osteoarthritis   . IBS (irritable bowel syndrome)   . Hemorrhoids   . History of cerebrovascular accident   . TIA (transient ischemic attack)     anticoagulation therapy on coumadin  . Nephrolithiasis     hx  . HTN (hypertension)   . Stroke     STROKES  . DUB (dysfunctional uterine bleeding)   . Atrophic vaginitis   . Menopausal symptoms   . DI (detrusor instability)   . Basal cell carcinoma      Family History  Problem Relation Age of Onset  . COPD Mother   . Breast cancer Mother   . Uterine cancer Mother   . Cancer Mother     OV/UTERINE     History   Social History  . Marital Status: Married    Spouse Name: N/A    Number of Children: 2  . Years of Education: N/A   Occupational History  .     Social History Main Topics  . Smoking status: Former Smoker -- 2.0 packs/day for 30 years    Types: Cigarettes    Quit date: 12/06/1995  . Smokeless tobacco: Never Used  . Alcohol  Use: Yes     occas  . Drug Use: No  . Sexually Active: Yes    Birth Control/ Protection: Surgical   Other Topics Concern  . Not on file   Social History Narrative   Retired- Chemical engineer.      Allergies  Allergen Reactions  . Biaxin   . Clarithromycin     REACTION: possible rash but could have been legionarres dz with rash  . Crestor (Rosuvastatin Calcium)     fagitue and joint pain     Outpatient Prescriptions Prior to Visit  Medication Sig Dispense Refill  . albuterol (PROAIR HFA) 108 (90 BASE) MCG/ACT inhaler Inhale into the lungs. 1-2 puffs every 4-6 hours as needed       . amLODipine (NORVASC) 5 MG tablet Take 1 tablet (5 mg total) by mouth daily.  90 tablet  3  . atorvastatin (LIPITOR) 20 MG tablet Take 1 tablet (20 mg total) by mouth daily.  90 tablet  3  . budesonide-formoterol (SYMBICORT) 160-4.5 MCG/ACT inhaler Inhale 2 puffs into the lungs 2 (two) times daily.  1 Inhaler  5  . Calcium  Carbonate-Vitamin D (CALCIUM + D PO) Take 1 tablet by mouth 2 (two) times daily.       . celecoxib (CELEBREX) 100 MG capsule Take 100 mg by mouth daily.       . cyclobenzaprine (FLEXERIL) 5 MG tablet 1-2 tabs at night as needed for cramps  180 tablet  3  . Estradiol (ESTROGEL) 0.75 MG/1.25 GM (0.06%) topical gel Place 1.25 g onto the skin as directed. PT. CAN NOT HAVE A 90 DAY SUPPLY. SHE IS VERY OVERDUE FOR HER YEARLY EXAM AND SHE HAS NOT SET UP AN APPOINTMENT.  1 Bottle  0  . fluocinonide (LIDEX) 0.05 % cream Apply topically 2 (two) times daily as needed. Use as directed twice per day as needed  30 g  1  . irbesartan (AVAPRO) 150 MG tablet Take 1 tablet (150 mg total) by mouth daily.  90 tablet  3  . Multiple Vitamin (MULTIVITAMIN) tablet Take 1 tablet by mouth daily.        Marland Kitchen omeprazole (PRILOSEC) 20 MG capsule TAKE 2 CAPSULES BY MOUTH ONCE A DAY  180 capsule  2  . scopolamine (TRANSDERM-SCOP) 1.5 MG Place 1 patch onto the skin as needed.       . warfarin (COUMADIN) 5  MG tablet Take 1 tablet (5 mg total) by mouth daily.  90 tablet  1      Review of Systems  Constitutional: Negative for chills, diaphoresis, activity change, appetite change, fatigue and unexpected weight change.  HENT: Negative for hearing loss, nosebleeds, congestion, facial swelling, sneezing, mouth sores, trouble swallowing, neck stiffness, dental problem, voice change, postnasal drip, sinus pressure, tinnitus and ear discharge.   Eyes: Negative for photophobia, discharge, itching and visual disturbance.  Respiratory: Negative for apnea, cough, choking, chest tightness and stridor.   Cardiovascular: Negative for palpitations.  Gastrointestinal: Negative for nausea, constipation, blood in stool and abdominal distention.  Genitourinary: Negative for dysuria, urgency, frequency, hematuria, flank pain, decreased urine volume and difficulty urinating.  Musculoskeletal: Negative for myalgias, back pain, joint swelling, arthralgias and gait problem.  Skin: Negative for color change and pallor.  Neurological: Negative for dizziness, tremors, seizures, syncope, speech difficulty, weakness, light-headedness and numbness.  Hematological: Negative for adenopathy. Does not bruise/bleed easily.  Psychiatric/Behavioral: Negative for confusion, sleep disturbance and agitation. The patient is not nervous/anxious.        Objective:   Physical Exam     Filed Vitals:   03/09/12 1552  BP: 152/90  Pulse: 98  Temp: 98.4 F (36.9 C)  TempSrc: Oral  Height: 5\' 3"  (1.6 m)  Weight: 163 lb (73.936 kg)  SpO2: 94%    Gen: Pleasant, well-nourished, in no distress,  normal affect  ENT: No lesions,  mouth clear,  oropharynx clear, no postnasal drip  Neck: No JVD, no TMG, no carotid bruits  Lungs: No use of accessory muscles, no dullness to percussion, exp wheeze.  Cardiovascular: RRR, heart sounds normal, no murmur or gallops, no peripheral edema  Abdomen: soft and NT, no HSM,  BS  normal  Musculoskeletal: No deformities, no cyanosis or clubbing  Neuro: alert, non focal  Skin: Warm, no lesions or rashes  FeV1 62%  FVC 104%  DLCO 69%  TLC 129%  12/28/11  Dg Chest 2 View  03/09/2012  *RADIOLOGY REPORT*  Clinical Data: Cough, shortness of breath, hypertension, COPD, former smoker  CHEST - 2 VIEW  Comparison: 11/09/2011  Findings: Normal heart size, mediastinal contours, and pulmonary vascularity. Atherosclerotic calcification aorta. Emphysematous and  chronic bronchitic changes. Minimal atelectasis left base. No definite infiltrate, pleural effusion or pneumothorax. Diffuse osseous demineralization.  IMPRESSION: Emphysematous and chronic bronchitic changes with minimal left base atelectasis.  Original Report Authenticated By: Lollie Marrow, M.D.       Assessment & Plan:   COPD Acute tracheobronchitis with flare, no PNA on CXR Plan Prednisone 10mg  Take 4 for two days three for two days two for two days one for two days Avelox one daily for 5 days Use Hycodan and tessalon per cough protocol Chest xray today Stay on symbicort Return 7-10days with Tammy Parrett for recheck      Updated Medication List Outpatient Encounter Prescriptions as of 03/09/2012  Medication Sig Dispense Refill  . albuterol (PROAIR HFA) 108 (90 BASE) MCG/ACT inhaler Inhale into the lungs. 1-2 puffs every 4-6 hours as needed       . amLODipine (NORVASC) 5 MG tablet Take 1 tablet (5 mg total) by mouth daily.  90 tablet  3  . atorvastatin (LIPITOR) 20 MG tablet Take 1 tablet (20 mg total) by mouth daily.  90 tablet  3  . budesonide-formoterol (SYMBICORT) 160-4.5 MCG/ACT inhaler Inhale 2 puffs into the lungs 2 (two) times daily.  1 Inhaler  5  . Calcium Carbonate-Vitamin D (CALCIUM + D PO) Take 1 tablet by mouth 2 (two) times daily.       . celecoxib (CELEBREX) 100 MG capsule Take 100 mg by mouth daily.       . cyclobenzaprine (FLEXERIL) 5 MG tablet 1-2 tabs at night as needed for cramps  180  tablet  3  . Estradiol (ESTROGEL) 0.75 MG/1.25 GM (0.06%) topical gel Place 1.25 g onto the skin as directed. PT. CAN NOT HAVE A 90 DAY SUPPLY. SHE IS VERY OVERDUE FOR HER YEARLY EXAM AND SHE HAS NOT SET UP AN APPOINTMENT.  1 Bottle  0  . fluocinonide (LIDEX) 0.05 % cream Apply topically 2 (two) times daily as needed. Use as directed twice per day as needed  30 g  1  . irbesartan (AVAPRO) 150 MG tablet Take 1 tablet (150 mg total) by mouth daily.  90 tablet  3  . Multiple Vitamin (MULTIVITAMIN) tablet Take 1 tablet by mouth daily.        Marland Kitchen omeprazole (PRILOSEC) 20 MG capsule TAKE 2 CAPSULES BY MOUTH ONCE A DAY  180 capsule  2  . scopolamine (TRANSDERM-SCOP) 1.5 MG Place 1 patch onto the skin as needed.       . warfarin (COUMADIN) 5 MG tablet Take 1 tablet (5 mg total) by mouth daily.  90 tablet  1  . benzonatate (TESSALON) 100 MG capsule Take 1 capsule (100 mg total) by mouth 3 (three) times daily as needed for cough.  60 capsule  6  . HYDROcodone-homatropine (HYCODAN) 5-1.5 MG/5ML syrup Take 5 mLs by mouth every 6 (six) hours as needed for cough.  120 mL  0  . moxifloxacin (AVELOX) 400 MG tablet Take 1 tablet (400 mg total) by mouth daily.  5 tablet  0  . predniSONE (DELTASONE) 10 MG tablet Take 4 for two days three for two days two for two days one for two days  20 tablet  0

## 2012-03-10 NOTE — Assessment & Plan Note (Signed)
Acute tracheobronchitis with flare, no PNA on CXR Plan Prednisone 10mg  Take 4 for two days three for two days two for two days one for two days Avelox one daily for 5 days Use Hycodan and tessalon per cough protocol Chest xray today Stay on symbicort Return 7-10days with Tammy Parrett for recheck

## 2012-03-15 ENCOUNTER — Ambulatory Visit (INDEPENDENT_AMBULATORY_CARE_PROVIDER_SITE_OTHER): Payer: Medicare Other | Admitting: *Deleted

## 2012-03-15 DIAGNOSIS — G459 Transient cerebral ischemic attack, unspecified: Secondary | ICD-10-CM

## 2012-03-15 DIAGNOSIS — Z8679 Personal history of other diseases of the circulatory system: Secondary | ICD-10-CM

## 2012-03-15 DIAGNOSIS — Z7901 Long term (current) use of anticoagulants: Secondary | ICD-10-CM

## 2012-03-15 LAB — POCT INR: INR: 3.4

## 2012-03-19 ENCOUNTER — Other Ambulatory Visit: Payer: Self-pay | Admitting: Internal Medicine

## 2012-03-20 ENCOUNTER — Ambulatory Visit (INDEPENDENT_AMBULATORY_CARE_PROVIDER_SITE_OTHER): Payer: Medicare Other | Admitting: Adult Health

## 2012-03-20 ENCOUNTER — Encounter: Payer: Self-pay | Admitting: Adult Health

## 2012-03-20 VITALS — BP 124/66 | HR 99 | Temp 97.0°F | Ht 63.0 in | Wt 148.3 lb

## 2012-03-20 DIAGNOSIS — J449 Chronic obstructive pulmonary disease, unspecified: Secondary | ICD-10-CM

## 2012-03-20 DIAGNOSIS — J4489 Other specified chronic obstructive pulmonary disease: Secondary | ICD-10-CM

## 2012-03-20 NOTE — Progress Notes (Signed)
Subjective:    Patient ID: [name], female    DOB: 09-03-1941, 71 y.o.   MRN: [id number]  HPI  71 y.o.F  12/5 At last ov we rec pulse pred and spiriva.  No PET.  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 Still notes dyspnea daily and still gasps for breath.  Can get air in cannot expell Will wheeze. Uses the spiriva.  Prednisone was an issue with the INR.   No real cough. No mucus.  No real blood.    1/23 At last OV we stopped spiriva and started symbicort two puff bid Seems better.    Not as much wheeze.   Notes more pndrip   4/5 Ill for 3weeks.  Severe cough, HAs,  Short of breath.  Difficulty raising mucus. Mucus still yellow On mucinex.  Rx Keflux 500 qid x 10day, 10mg  pred pulse taper over 10days Just finished this 5 days ago. >Avelox and Steroid taper   03/20/2012 Follow up Patient returns for 2 week followup of her recent COPD exacerbation. At last visit. Patient was treated with Avelox and a steroid taper. Chest x-ray showed no acute changes. Patient reports that she is feeling much better with decreased cough and congestion. Continues to have a mild sore throat, and some hoarseness at times. Denies any chest pain, hemoptysis, orthopnea, PND, or leg swelling  Past Medical History  Diagnosis Date  . SBE (subacute bacterial endocarditis) prophylaxis candidate   . COPD (chronic obstructive pulmonary disease)   . GERD (gastroesophageal reflux disease)   . Fibrocystic breast disease   . HLD (hyperlipidemia)   . Osteoarthritis   . IBS (irritable bowel syndrome)   . Hemorrhoids   . History of cerebrovascular accident   . TIA (transient ischemic attack)     anticoagulation therapy on coumadin  . Nephrolithiasis     hx  . HTN (hypertension)   . Stroke     STROKES  . DUB (dysfunctional uterine bleeding)   . Atrophic vaginitis   . Menopausal symptoms   . DI (detrusor instability)   . Basal cell carcinoma      Family History  Problem Relation Age of Onset  . COPD Mother   .  Breast cancer Mother   . Uterine cancer Mother   . Cancer Mother     OV/UTERINE     History   Social History  . Marital Status: Married    Spouse Name: N/A    Number of Children: 2  . Years of Education: N/A   Occupational History  .     Social History Main Topics  . Smoking status: Former Smoker -- 2.0 packs/day for 30 years    Types: Cigarettes    Quit date: 12/06/1995  . Smokeless tobacco: Never Used  . Alcohol Use: Yes     occas  . Drug Use: No  . Sexually Active: Yes    Birth Control/ Protection: Surgical   Other Topics Concern  . Not on file   Social History Narrative   Retired- Chemical engineer.      Allergies  Allergen Reactions  . Biaxin   . Clarithromycin     REACTION: possible rash but could have been legionarres dz with rash  . Crestor (Rosuvastatin Calcium)     fagitue and joint pain     Outpatient Prescriptions Prior to Visit  Medication Sig Dispense Refill  . albuterol (PROAIR HFA) 108 (90 BASE) MCG/ACT inhaler Inhale into the lungs. 1-2 puffs every 4-6 hours as needed       .  amLODipine (NORVASC) 5 MG tablet Take 1 tablet (5 mg total) by mouth daily.  90 tablet  3  . atorvastatin (LIPITOR) 20 MG tablet Take 1 tablet (20 mg total) by mouth daily.  90 tablet  3  . budesonide-formoterol (SYMBICORT) 160-4.5 MCG/ACT inhaler Inhale 2 puffs into the lungs 2 (two) times daily.  1 Inhaler  5  . Calcium Carbonate-Vitamin D (CALCIUM + D PO) Take 1 tablet by mouth 2 (two) times daily.       . celecoxib (CELEBREX) 100 MG capsule Take 100 mg by mouth daily.       . cyclobenzaprine (FLEXERIL) 5 MG tablet 1-2 tabs at night as needed for cramps  180 tablet  3  . Estradiol (ESTROGEL) 0.75 MG/1.25 GM (0.06%) topical gel Place 1.25 g onto the skin as directed. PT. CAN NOT HAVE A 90 DAY SUPPLY. SHE IS VERY OVERDUE FOR HER YEARLY EXAM AND SHE HAS NOT SET UP AN APPOINTMENT.  1 Bottle  0  . fluocinonide (LIDEX) 0.05 % cream Apply topically 2 (two) times  daily as needed. Use as directed twice per day as needed  30 g  1  . irbesartan (AVAPRO) 150 MG tablet Take 1 tablet (150 mg total) by mouth daily.  90 tablet  3  . Multiple Vitamin (MULTIVITAMIN) tablet Take 1 tablet by mouth daily.        Marland Kitchen omeprazole (PRILOSEC) 20 MG capsule TAKE 2 CAPSULES BY MOUTH ONCE A DAY  180 capsule  2  . scopolamine (TRANSDERM-SCOP) 1.5 MG Place 1 patch onto the skin as needed.       . warfarin (COUMADIN) 5 MG tablet Take 1 tablet (5 mg total) by mouth daily.  90 tablet  1      ROS :  Constitutional:   No  weight loss, night sweats,  Fevers, chills, + fatigue, or  lassitude.  HEENT:   No headaches,  Difficulty swallowing,  Tooth/dental problems, or  Sore throat,                No sneezing, itching, ear ache, nasal congestion, post nasal drip,   CV:  No chest pain,  Orthopnea, PND, swelling in lower extremities, anasarca, dizziness, palpitations, syncope.   GI  No heartburn, indigestion, abdominal pain, nausea, vomiting, diarrhea, change in bowel habits, loss of appetite, bloody stools.   Resp:  No coughing up of blood.   Marland Kitchen  No chest wall deformity  Skin: no rash or lesions.  GU: no dysuria, change in color of urine, no urgency or frequency.  No flank pain, no hematuria   MS:  No joint pain or swelling.  No decreased range of motion.  No back pain.  Psych:  No change in mood or affect. No depression or anxiety.  No memory loss.         Objective:   Physical Exam  Gen: Pleasant, well-nourished, in no distress,  normal affect  ENT: No lesions,  mouth clear,  oropharynx clear, no postnasal drip  Neck: No JVD, no TMG, no carotid bruits  Lungs: No use of accessory muscles, no dullness to percussion,coarse BS w/ no wheeze   Cardiovascular: RRR, heart sounds normal, no murmur or gallops, no peripheral edema  Abdomen: soft and NT, no HSM,  BS normal  Musculoskeletal: No deformities, no cyanosis or clubbing  Neuro: alert, non focal  Skin: Warm,  no lesions or rashes      FeV1 62%  FVC 104%  DLCO 69%  TLC 129%  12/28/11  Dg Chest 2 View  03/09/2012  *RADIOLOGY REPORT*  Clinical Data: Cough, shortness of breath, hypertension, COPD, former smoker  CHEST - 2 VIEW  Comparison: 11/09/2011  Findings: Normal heart size, mediastinal contours, and pulmonary vascularity. Atherosclerotic calcification aorta. Emphysematous and chronic bronchitic changes. Minimal atelectasis left base. No definite infiltrate, pleural effusion or pneumothorax. Diffuse osseous demineralization.  IMPRESSION: Emphysematous and chronic bronchitic changes with minimal left base atelectasis.  Original Report Authenticated By: Lollie Marrow, M.D.       Assessment & Plan:

## 2012-03-20 NOTE — Patient Instructions (Addendum)
Continue on current regimen Follow back up with Dr. Delford Field in 1 months and as needed

## 2012-03-22 NOTE — Assessment & Plan Note (Signed)
Recent COPD exacerbation, now resolved. Patient is back to her, baseline. We'll continue on her current regimen and followup with Dr. Delford Field in one month and as needed

## 2012-04-02 ENCOUNTER — Ambulatory Visit (INDEPENDENT_AMBULATORY_CARE_PROVIDER_SITE_OTHER): Payer: Medicare Other | Admitting: Adult Health

## 2012-04-02 ENCOUNTER — Encounter: Payer: Self-pay | Admitting: Adult Health

## 2012-04-02 VITALS — BP 132/74 | HR 73 | Temp 97.0°F | Ht 63.0 in | Wt 160.2 lb

## 2012-04-02 DIAGNOSIS — J449 Chronic obstructive pulmonary disease, unspecified: Secondary | ICD-10-CM

## 2012-04-02 MED ORDER — CEFDINIR 300 MG PO CAPS: 300.0000 mg | ORAL_CAPSULE | Freq: Two times a day (BID) | ORAL | Status: AC

## 2012-04-02 MED ORDER — PREDNISONE 10 MG PO TABS: ORAL_TABLET | ORAL | Status: DC

## 2012-04-02 MED ORDER — LEVALBUTEROL HCL 0.63 MG/3ML IN NEBU
0.6300 mg | INHALATION_SOLUTION | Freq: Once | RESPIRATORY_TRACT | Status: AC
  Administered 2012-04-02: 0.63 mg via RESPIRATORY_TRACT

## 2012-04-02 MED ORDER — TIOTROPIUM BROMIDE MONOHYDRATE 18 MCG IN CAPS: 18.0000 ug | ORAL_CAPSULE | Freq: Every day | RESPIRATORY_TRACT | Status: DC

## 2012-04-02 NOTE — Patient Instructions (Addendum)
Omnicef 300 mg twice daily for 10 days, take with food. Eat yogurt while on antibiotic. Mucinex DM twice daily as needed for cough. Fluids and rest. Prednisone taper as directed. Restart Spiriva one puff daily. Restart Astelin nasal spray in 3 days. Begin Zyrtec 10 mg daily in 3 days. Begin Pepicd 20 mg At bedtime   Saline nasal rinses as needed. Follow up. Dr. Delford Field in 3-4 weeks. Please contact office for sooner follow up if symptoms do not improve or worsen or seek emergency care

## 2012-04-02 NOTE — Progress Notes (Signed)
Addended by: Nita Sells on: 04/02/2012 11:17 AM   Modules accepted: Orders

## 2012-04-02 NOTE — Progress Notes (Signed)
Subjective:    Patient ID: [name], female    DOB: [birth year], 71 y.o.   MRN: [id number]  HPI  71 y.o.F  12/5 At last ov we rec pulse pred and spiriva.  No PET.  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 Still notes dyspnea daily and still gasps for breath.  Can get air in cannot expell Will wheeze. Uses the spiriva.  Prednisone was an issue with the INR.   No real cough. No mucus.  No real blood.    1/23 At last OV we stopped spiriva and started symbicort two puff bid Seems better.    Not as much wheeze.   Notes more pndrip   4/5 Ill for 3weeks.  Severe cough, HAs,  Short of breath.  Difficulty raising mucus. Mucus still yellow On mucinex.  Rx Keflux 500 qid x 10day, 10mg  pred pulse taper over 10days Just finished this 5 days ago. >Avelox and Steroid taper   4/16 /2013 Follow up Patient returns for 2 week followup of her recent COPD exacerbation. At last visit. Patient was treated with Avelox and a steroid taper. Chest x-ray showed no acute changes. Patient reports that she is feeling much better with decreased cough and congestion. Continues to have a mild sore throat, and some hoarseness at times. Denies any chest pain, hemoptysis, orthopnea, PND, or leg swelling >>No changes    04/02/2012 Acute OV  Complains of increased SOB, prod cough with yellow/clear mucus, wheezing, head congestion, PND x 5 days. Initially started with sore throat then progressed into a severe cough, congestion. Coughing up thick yellow mucus. Taking tessalon and hydromet with some help.  Reason has restarted her the last 2 days. She denies any hemoptysis, chest pain, orthopnea, PND, or leg swelling. Has noted cough is worse at night. Also, having increased reflux symptoms.  Past Medical History  Diagnosis Date  . SBE (subacute bacterial endocarditis) prophylaxis candidate   . COPD (chronic obstructive pulmonary disease)   . GERD (gastroesophageal reflux disease)   . Fibrocystic breast disease   . HLD  (hyperlipidemia)   . Osteoarthritis   . IBS (irritable bowel syndrome)   . Hemorrhoids   . History of cerebrovascular accident   . TIA (transient ischemic attack)     anticoagulation therapy on coumadin  . Nephrolithiasis     hx  . HTN (hypertension)   . Stroke     STROKES  . DUB (dysfunctional uterine bleeding)   . Atrophic vaginitis   . Menopausal symptoms   . DI (detrusor instability)   . Basal cell carcinoma      Family History  Problem Relation Age of Onset  . COPD Mother   . Breast cancer Mother   . Uterine cancer Mother   . Cancer Mother     OV/UTERINE     History   Social History  . Marital Status: Married    Spouse Name: N/A    Number of Children: 2  . Years of Education: N/A   Occupational History  .     Social History Main Topics  . Smoking status: Former Smoker -- 2.0 packs/day for 30 years    Types: Cigarettes    Quit date: 12/06/1995  . Smokeless tobacco: Never Used  . Alcohol Use: Yes     occas  . Drug Use: No  . Sexually Active: Yes    Birth Control/ Protection: Surgical   Other Topics Concern  . Not on file   Social History Narrative   Retired-  hazardous material specialist.      Allergies  Allergen Reactions  . Biaxin   . Clarithromycin     REACTION: possible rash but could have been legionarres dz with rash  . Crestor (Rosuvastatin Calcium)     fagitue and joint pain     Outpatient Prescriptions Prior to Visit  Medication Sig Dispense Refill  . albuterol (PROAIR HFA) 108 (90 BASE) MCG/ACT inhaler Inhale into the lungs. 1-2 puffs every 4-6 hours as needed       . amLODipine (NORVASC) 5 MG tablet Take 1 tablet (5 mg total) by mouth daily.  90 tablet  3  . atorvastatin (LIPITOR) 20 MG tablet Take 1 tablet (20 mg total) by mouth daily.  90 tablet  3  . budesonide-formoterol (SYMBICORT) 160-4.5 MCG/ACT inhaler Inhale 2 puffs into the lungs 2 (two) times daily.  1 Inhaler  5  . Calcium Carbonate-Vitamin D (CALCIUM + D PO) Take 1  tablet by mouth 2 (two) times daily.       . celecoxib (CELEBREX) 100 MG capsule Take 100 mg by mouth daily.       . cyclobenzaprine (FLEXERIL) 5 MG tablet 1-2 tabs at night as needed for cramps  180 tablet  3  . Estradiol (ESTROGEL) 0.75 MG/1.25 GM (0.06%) topical gel Place 1.25 g onto the skin as directed. PT. CAN NOT HAVE A 90 DAY SUPPLY. SHE IS VERY OVERDUE FOR HER YEARLY EXAM AND SHE HAS NOT SET UP AN APPOINTMENT.  1 Bottle  0  . fluocinonide (LIDEX) 0.05 % cream Apply topically 2 (two) times daily as needed. Use as directed twice per day as needed  30 g  1  . irbesartan (AVAPRO) 150 MG tablet Take 1 tablet (150 mg total) by mouth daily.  90 tablet  3  . Multiple Vitamin (MULTIVITAMIN) tablet Take 1 tablet by mouth daily.        Marland Kitchen omeprazole (PRILOSEC) 20 MG capsule TAKE 2 CAPSULES BY MOUTH ONCE A DAY  180 capsule  2  . scopolamine (TRANSDERM-SCOP) 1.5 MG Place 1 patch onto the skin as needed.       . warfarin (COUMADIN) 5 MG tablet Take 1 tablet (5 mg total) by mouth daily.  90 tablet  1      ROS :  Constitutional:   No  weight loss, night sweats,  Fevers, chills, + fatigue, or  lassitude.  HEENT:   No headaches,  Difficulty swallowing,  Tooth/dental problems, or  Sore throat,                No sneezing, itching, ear ache, + nasal congestion, post nasal drip,   CV:  No chest pain,  Orthopnea, PND, swelling in lower extremities, anasarca, dizziness, palpitations, syncope.   GI  No   abdominal pain, nausea, vomiting, diarrhea, change in bowel habits, loss of appetite, bloody stools.   Resp:  No coughing up of blood.   Marland Kitchen  No chest wall deformity  Skin: no rash or lesions.  GU: no dysuria, change in color of urine, no urgency or frequency.  No flank pain, no hematuria   MS:  No joint pain or swelling.  No decreased range of motion.  No back pain.  Psych:  No change in mood or affect. No depression or anxiety.  No memory loss.         Objective:   Physical Exam  Gen:  Pleasant, well-nourished, in no distress,  normal affect  ENT: No lesions,  mouth clear,  oropharynx clear, no postnasal drip  Neck: No JVD, no TMG, no carotid bruits  Lungs: No use of accessory muscles, no dullness to percussion,coarse BS w/ exp wheezing   Cardiovascular: RRR, heart sounds normal, no murmur or gallops, no peripheral edema  Abdomen: soft and NT, no HSM,  BS normal  Musculoskeletal: No deformities, no cyanosis or clubbing  Neuro: alert, non focal  Skin: Warm, no lesions or rashes      FeV1 62%  FVC 104%  DLCO 69%  TLC 129%  12/28/11  Dg Chest 2 View  03/09/2012  *RADIOLOGY REPORT*  Clinical Data: Cough, shortness of breath, hypertension, COPD, former smoker  CHEST - 2 VIEW  Comparison: 11/09/2011  Findings: Normal heart size, mediastinal contours, and pulmonary vascularity. Atherosclerotic calcification aorta. Emphysematous and chronic bronchitic changes. Minimal atelectasis left base. No definite infiltrate, pleural effusion or pneumothorax. Diffuse osseous demineralization.  IMPRESSION: Emphysematous and chronic bronchitic changes with minimal left base atelectasis.  Original Report Authenticated By: Lollie Marrow, M.D.       Assessment & Plan:

## 2012-04-02 NOTE — Assessment & Plan Note (Signed)
Slow to resolve, exacerbation CXR w/out acute process 03/09/2012   Plan;  Omnicef 300 mg twice daily for 10 days, take with food. Eat yogurt while on antibiotic. Mucinex DM twice daily as needed for cough. Fluids and rest. Prednisone taper as directed. Restart Spiriva one puff daily. Restart Astelin nasal spray in 3 days. Begin Zyrtec 10 mg daily in 3 days. Saline nasal rinses as needed. Follow up. Dr. Delford Field in 3-4 weeks. Please contact office for sooner follow up if symptoms do not improve or worsen or seek emergency care

## 2012-04-05 ENCOUNTER — Ambulatory Visit (INDEPENDENT_AMBULATORY_CARE_PROVIDER_SITE_OTHER): Payer: Medicare Other

## 2012-04-05 DIAGNOSIS — Z7901 Long term (current) use of anticoagulants: Secondary | ICD-10-CM

## 2012-04-05 DIAGNOSIS — Z8679 Personal history of other diseases of the circulatory system: Secondary | ICD-10-CM

## 2012-04-05 DIAGNOSIS — G459 Transient cerebral ischemic attack, unspecified: Secondary | ICD-10-CM

## 2012-04-05 LAB — POCT INR: INR: 3.6

## 2012-04-19 ENCOUNTER — Ambulatory Visit (INDEPENDENT_AMBULATORY_CARE_PROVIDER_SITE_OTHER): Payer: Medicare Other | Admitting: *Deleted

## 2012-04-19 DIAGNOSIS — Z7901 Long term (current) use of anticoagulants: Secondary | ICD-10-CM

## 2012-04-19 DIAGNOSIS — G459 Transient cerebral ischemic attack, unspecified: Secondary | ICD-10-CM

## 2012-04-19 DIAGNOSIS — Z8679 Personal history of other diseases of the circulatory system: Secondary | ICD-10-CM

## 2012-04-19 LAB — POCT INR: INR: 2.7

## 2012-04-26 ENCOUNTER — Ambulatory Visit (INDEPENDENT_AMBULATORY_CARE_PROVIDER_SITE_OTHER): Payer: Medicare Other | Admitting: Critical Care Medicine

## 2012-04-26 ENCOUNTER — Encounter: Payer: Self-pay | Admitting: Critical Care Medicine

## 2012-04-26 VITALS — BP 136/72 | HR 76 | Temp 97.5°F | Ht 63.0 in | Wt 158.0 lb

## 2012-04-26 DIAGNOSIS — J449 Chronic obstructive pulmonary disease, unspecified: Secondary | ICD-10-CM

## 2012-04-26 NOTE — Progress Notes (Signed)
Subjective:    Patient ID: [name], female    DOB: 01/23/1941, 71 y.o.   MRN: [id number]  HPI 71 y.o.  WF Copd    04/02/2012 Acute OV  Complains of increased SOB, prod cough with yellow/clear mucus, wheezing, head congestion, PND x 5 days. Initially started with sore throat then progressed into a severe cough, congestion. Coughing up thick yellow mucus. Taking tessalon and hydromet with some help.  Reason has restarted her the last 2 days. She denies any hemoptysis, chest pain, orthopnea, PND, or leg swelling. Has noted cough is worse at night. Also, having increased reflux symptoms.  04/26/2012 At last ov saw NP Omnicef 300 mg twice daily for 10 days, take with food.  Eat yogurt while on antibiotic.  Mucinex DM twice daily as needed for cough.  Fluids and rest.  Prednisone taper as directed.  Restart Spiriva one puff daily.  Restart Astelin nasal spray in 3 days.  Begin Zyrtec 10 mg daily in 3 days.  Saline nasal rinses as needed.  Follow up. Dr. Delford Field in 3-4 weeks.  Pt started with sinus issues and then bronchitis. Rx omniceph and this helped.  Prednisone hard on the pt.  INR too thin.  Spiriva restarted.   Now on spiriva and symbicort .  Currently is better.  No real cough. Pt is still hoarse.   Pt denies any significant sore throat, nasal congestion or excess secretions, fever, chills, sweats, unintended weight loss, pleurtic or exertional chest pain, orthopnea PND, or leg swelling Pt denies any increase in rescue therapy over baseline, denies waking up needing it or having any early am or nocturnal exacerbations of coughing/wheezing/or dyspnea. Pt also denies any obvious fluctuation in symptoms with  weather or environmental change or other alleviating or aggravating factors      Review of Systems Constitutional:   No  weight loss, night sweats,  Fevers, chills, fatigue, lassitude. HEENT:   No headaches,  Difficulty swallowing,  Tooth/dental problems,  Sore throat,                No sneezing, itching, ear ache, nasal congestion, post nasal drip,   CV:  No chest pain,  Orthopnea, PND, swelling in lower extremities, anasarca, dizziness, palpitations  GI  No heartburn, indigestion, abdominal pain, nausea, vomiting, diarrhea, change in bowel habits, loss of appetite  Resp: ++ shortness of breath with exertion not  at rest.  No excess mucus, no productive cough,  No non-productive cough,  No coughing up of blood.  No change in color of mucus.  No wheezing.  No chest wall deformity  Skin: no rash or lesions.  GU: no dysuria, change in color of urine, no urgency or frequency.  No flank pain.  MS:  No joint pain or swelling.  No decreased range of motion.  No back pain.  Psych:  No change in mood or affect. No depression or anxiety.  No memory loss.     Objective:   Physical Exam Filed Vitals:   04/26/12 1049  BP: 136/72  Pulse: 76  Temp: 97.5 F (36.4 C)  TempSrc: Oral  Height: 5\' 3"  (1.6 m)  Weight: 158 lb (71.668 kg)  SpO2: 92%    Gen: Pleasant, well-nourished, in no distress,  normal affect  ENT: No lesions,  mouth clear,  oropharynx clear, no postnasal drip  Neck: No JVD, no TMG, no carotid bruits  Lungs: No use of accessory muscles, no dullness to percussion, distant breath sounds  Cardiovascular:  RRR, heart sounds normal, no murmur or gallops, no peripheral edema  Abdomen: soft and NT, no HSM,  BS normal  Musculoskeletal: No deformities, no cyanosis or clubbing  Neuro: alert, non focal  Skin: Warm, no lesions or rashes  No results found.        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 albuterol (PROAIR HFA) 108 (90 BASE) MCG/ACT inhaler Inhale into the  lungs. 1-2 puffs every 4-6 hours as needed       . amLODipine (NORVASC) 5 MG tablet Take 1 tablet (5 mg total) by mouth daily.  90 tablet  3  . atorvastatin (LIPITOR) 20 MG tablet Take 1 tablet (20 mg total) by mouth daily.  90 tablet  3  . benzonatate (TESSALON) 100 MG capsule Take 100 mg by mouth 3 (three) times daily as needed.      . budesonide-formoterol (SYMBICORT) 160-4.5 MCG/ACT inhaler Inhale 2 puffs into the lungs 2 (two) times daily.  1 Inhaler  5  . Calcium Carbonate-Vitamin D (CALCIUM + D PO) Take 1 tablet by mouth 2 (two) times daily.       . celecoxib (CELEBREX) 100 MG capsule Take 100 mg by mouth daily.       . cyclobenzaprine (FLEXERIL) 5 MG tablet 1-2 tabs at night as needed for cramps  180 tablet  3  . Estradiol (ESTROGEL) 0.75 MG/1.25 GM (0.06%) topical gel Place 1.25 g onto the skin as directed. PT. CAN NOT HAVE A 90 DAY SUPPLY. SHE IS VERY OVERDUE FOR HER YEARLY EXAM AND SHE HAS NOT SET UP AN APPOINTMENT.  1 Bottle  0  . fluocinonide (LIDEX) 0.05 % cream Apply topically 2 (two) times daily as needed. Use as directed twice per day as needed  30 g  1  . HYDROcodone-homatropine (HYCODAN) 5-1.5 MG/5ML syrup Take 5 mLs by mouth every 6 (six) hours as needed.      . irbesartan (AVAPRO) 150 MG tablet Take 1 tablet (150 mg total) by mouth daily.  90 tablet  3  . Multiple Vitamin (MULTIVITAMIN) tablet Take 1 tablet by mouth daily.        Marland Kitchen omeprazole (PRILOSEC) 20 MG capsule TAKE 2 CAPSULES BY MOUTH ONCE A DAY  180 capsule  2  . scopolamine (TRANSDERM-SCOP) 1.5 MG Place 1 patch onto the skin as needed.       . warfarin (COUMADIN) 5 MG tablet Take 1 tablet (5 mg total) by mouth daily.  90 tablet  1  . DISCONTD: tiotropium (SPIRIVA HANDIHALER) 18 MCG inhalation capsule Place 1 capsule (18 mcg total) into inhaler and inhale daily.  30 capsule  12  . DISCONTD: predniSONE (DELTASONE) 10 MG tablet 4 tabs for 2 days, then 3 tabs for 2 days, 2 tabs for 2 days, then 1 tab for 2 days, then stop   20 tablet  0

## 2012-04-26 NOTE — Patient Instructions (Signed)
Stop spiriva Stay on astelin Stay on symbicort Return 3 months

## 2012-04-26 NOTE — Assessment & Plan Note (Signed)
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

## 2012-05-10 ENCOUNTER — Ambulatory Visit (INDEPENDENT_AMBULATORY_CARE_PROVIDER_SITE_OTHER): Payer: Medicare Other | Admitting: *Deleted

## 2012-05-10 DIAGNOSIS — G459 Transient cerebral ischemic attack, unspecified: Secondary | ICD-10-CM

## 2012-05-10 DIAGNOSIS — Z7901 Long term (current) use of anticoagulants: Secondary | ICD-10-CM

## 2012-05-10 DIAGNOSIS — Z8679 Personal history of other diseases of the circulatory system: Secondary | ICD-10-CM

## 2012-05-10 LAB — POCT INR: INR: 3.2

## 2012-05-31 ENCOUNTER — Ambulatory Visit (INDEPENDENT_AMBULATORY_CARE_PROVIDER_SITE_OTHER): Payer: Medicare Other | Admitting: *Deleted

## 2012-05-31 DIAGNOSIS — G459 Transient cerebral ischemic attack, unspecified: Secondary | ICD-10-CM

## 2012-05-31 DIAGNOSIS — Z8679 Personal history of other diseases of the circulatory system: Secondary | ICD-10-CM

## 2012-05-31 DIAGNOSIS — Z7901 Long term (current) use of anticoagulants: Secondary | ICD-10-CM

## 2012-05-31 LAB — POCT INR: INR: 2.7

## 2012-06-28 ENCOUNTER — Ambulatory Visit (INDEPENDENT_AMBULATORY_CARE_PROVIDER_SITE_OTHER): Payer: Medicare Other | Admitting: Pharmacist

## 2012-06-28 DIAGNOSIS — Z8679 Personal history of other diseases of the circulatory system: Secondary | ICD-10-CM

## 2012-06-28 DIAGNOSIS — G459 Transient cerebral ischemic attack, unspecified: Secondary | ICD-10-CM

## 2012-06-28 DIAGNOSIS — Z7901 Long term (current) use of anticoagulants: Secondary | ICD-10-CM

## 2012-06-28 LAB — POCT INR: INR: 2.1

## 2012-07-16 ENCOUNTER — Other Ambulatory Visit: Payer: Self-pay | Admitting: Obstetrics and Gynecology

## 2012-07-16 DIAGNOSIS — Z1231 Encounter for screening mammogram for malignant neoplasm of breast: Secondary | ICD-10-CM

## 2012-07-24 ENCOUNTER — Ambulatory Visit (INDEPENDENT_AMBULATORY_CARE_PROVIDER_SITE_OTHER): Payer: Medicare Other | Admitting: Critical Care Medicine

## 2012-07-24 ENCOUNTER — Ambulatory Visit (INDEPENDENT_AMBULATORY_CARE_PROVIDER_SITE_OTHER): Payer: Medicare Other | Admitting: *Deleted

## 2012-07-24 ENCOUNTER — Encounter: Payer: Self-pay | Admitting: Critical Care Medicine

## 2012-07-24 VITALS — BP 136/70 | HR 84 | Temp 98.1°F | Ht 63.0 in | Wt 158.6 lb

## 2012-07-24 DIAGNOSIS — Z7901 Long term (current) use of anticoagulants: Secondary | ICD-10-CM

## 2012-07-24 DIAGNOSIS — G459 Transient cerebral ischemic attack, unspecified: Secondary | ICD-10-CM

## 2012-07-24 DIAGNOSIS — Z8679 Personal history of other diseases of the circulatory system: Secondary | ICD-10-CM

## 2012-07-24 DIAGNOSIS — J449 Chronic obstructive pulmonary disease, unspecified: Secondary | ICD-10-CM

## 2012-07-24 LAB — POCT INR: INR: 3

## 2012-07-24 NOTE — Patient Instructions (Signed)
No change in medications. Return in      4 months Remember flu vaccine this fall

## 2012-07-24 NOTE — Progress Notes (Signed)
Subjective:    Patient ID: [name], female    DOB: January 25, 1941, 71 y.o.   MRN: [id number]  HPI  71 y.o.  WF Copd   07/24/2012 Doing better this summer.  Not much cough. Still dyspneic with heavy exertion.  No chest pain.  Now just on symbicort Pt denies any significant sore throat, nasal congestion or excess secretions, fever, chills, sweats, unintended weight loss, pleurtic or exertional chest pain, orthopnea PND, or leg swelling Pt denies any increase in rescue therapy over baseline, denies waking up needing it or having any early am or nocturnal exacerbations of coughing/wheezing/or dyspnea. Pt also denies any obvious fluctuation in symptoms with  weather or environmental change or other alleviating or aggravating factors      Review of Systems  Constitutional:   No  weight loss, night sweats,  Fevers, chills, fatigue, lassitude. HEENT:   No headaches,  Difficulty swallowing,  Tooth/dental problems,  Sore throat,                No sneezing, itching, ear ache, nasal congestion, post nasal drip,   CV:  No chest pain,  Orthopnea, PND, swelling in lower extremities, anasarca, dizziness, palpitations  GI  No heartburn, indigestion, abdominal pain, nausea, vomiting, diarrhea, change in bowel habits, loss of appetite  Resp: ++ shortness of breath with exertion not  at rest.  No excess mucus, no productive cough,  No non-productive cough,  No coughing up of blood.  No change in color of mucus.  No wheezing.  No chest wall deformity  Skin: no rash or lesions.  GU: no dysuria, change in color of urine, no urgency or frequency.  No flank pain.  MS:  No joint pain or swelling.  No decreased range of motion.  No back pain.  Psych:  No change in mood or affect. No depression or anxiety.  No memory loss.     Objective:   Physical Exam  Filed Vitals:   07/24/12 1338  BP: 136/70  Pulse: 84  Temp: 98.1 F (36.7 C)  TempSrc: Oral  Height: 5\' 3"  (1.6 m)  Weight: 158 lb 9.6 oz  (71.94 kg)  SpO2: 96%    Gen: Pleasant, well-nourished, in no distress,  normal affect  ENT: No lesions,  mouth clear,  oropharynx clear, no postnasal drip  Neck: No JVD, no TMG, no carotid bruits  Lungs: No use of accessory muscles, no dullness to percussion, distant breath sounds  Cardiovascular: RRR, heart sounds normal, no murmur or gallops, no peripheral edema  Abdomen: soft and NT, no HSM,  BS normal  Musculoskeletal: No deformities, no cyanosis or clubbing  Neuro: alert, non focal  Skin: Warm, no lesions or rashes  No results found.        Assessment & Plan:   COPD Asthmatic bronchitis with chronic obstructive lung disease stable at this time Plan Maintain Symbicort Return 4 months    Updated Medication List Outpatient Encounter Prescriptions as of 07/24/2012  Medication Sig Dispense Refill  . albuterol (PROAIR HFA) 108 (90 BASE) MCG/ACT inhaler Inhale into the lungs. 1-2 puffs every 4-6 hours as needed       . amLODipine (NORVASC) 5 MG tablet Take 1 tablet (5 mg total) by mouth daily.  90 tablet  3  . atorvastatin (LIPITOR) 20 MG tablet Take 1 tablet (20 mg total) by mouth daily.  90 tablet  3  . azelastine (ASTELIN) 137 MCG/SPRAY nasal spray Place 2 sprays into the nose daily. Use  in each nostril as directed      . benzonatate (TESSALON) 100 MG capsule Take 100 mg by mouth 3 (three) times daily as needed.      . budesonide-formoterol (SYMBICORT) 160-4.5 MCG/ACT inhaler Inhale 2 puffs into the lungs 2 (two) times daily.  1 Inhaler  5  . Calcium Carbonate-Vitamin D (CALCIUM + D PO) Take 1 tablet by mouth 2 (two) times daily.       . celecoxib (CELEBREX) 100 MG capsule Take 100 mg by mouth daily.       . cyclobenzaprine (FLEXERIL) 5 MG tablet 1-2 tabs at night as needed for cramps  180 tablet  3  . Estradiol (ESTROGEL) 0.75 MG/1.25 GM (0.06%) topical gel Place 1.25 g onto the skin as directed. PT. CAN NOT HAVE A 90 DAY SUPPLY. SHE IS VERY OVERDUE FOR HER YEARLY  EXAM AND SHE HAS NOT SET UP AN APPOINTMENT.  1 Bottle  0  . fluocinonide (LIDEX) 0.05 % cream Apply topically 2 (two) times daily as needed. Use as directed twice per day as needed  30 g  1  . HYDROcodone-homatropine (HYCODAN) 5-1.5 MG/5ML syrup Take 5 mLs by mouth every 6 (six) hours as needed.      . irbesartan (AVAPRO) 150 MG tablet Take 1 tablet (150 mg total) by mouth daily.  90 tablet  3  . Multiple Vitamin (MULTIVITAMIN) tablet Take 1 tablet by mouth daily.        Marland Kitchen omeprazole (PRILOSEC) 20 MG capsule TAKE 2 CAPSULES BY MOUTH ONCE A DAY  180 capsule  2  . scopolamine (TRANSDERM-SCOP) 1.5 MG Place 1 patch onto the skin as needed.       . warfarin (COUMADIN) 5 MG tablet Take 5 mg by mouth as directed.      Marland Kitchen DISCONTD: warfarin (COUMADIN) 5 MG tablet Take 1 tablet (5 mg total) by mouth daily.  90 tablet  1

## 2012-07-24 NOTE — Assessment & Plan Note (Signed)
Asthmatic bronchitis with chronic obstructive lung disease stable at this time Plan Maintain Symbicort Return 4 months

## 2012-08-27 ENCOUNTER — Ambulatory Visit
Admission: RE | Admit: 2012-08-27 | Discharge: 2012-08-27 | Disposition: A | Discharge status: Home / Self Care | Payer: Medicare Other | Source: Ambulatory Visit | Attending: Obstetrics and Gynecology | Admitting: Obstetrics and Gynecology

## 2012-08-27 DIAGNOSIS — Z1231 Encounter for screening mammogram for malignant neoplasm of breast: Secondary | ICD-10-CM

## 2012-08-31 ENCOUNTER — Ambulatory Visit (INDEPENDENT_AMBULATORY_CARE_PROVIDER_SITE_OTHER): Payer: Medicare Other | Admitting: Obstetrics and Gynecology

## 2012-08-31 ENCOUNTER — Encounter: Payer: Self-pay | Admitting: Obstetrics and Gynecology

## 2012-08-31 VITALS — BP 130/80 | Ht 63.0 in | Wt 157.0 lb

## 2012-08-31 DIAGNOSIS — IMO0002 Reserved for concepts with insufficient information to code with codable children: Secondary | ICD-10-CM

## 2012-08-31 DIAGNOSIS — N8111 Cystocele, midline: Secondary | ICD-10-CM

## 2012-08-31 DIAGNOSIS — Z780 Asymptomatic menopausal state: Secondary | ICD-10-CM

## 2012-08-31 DIAGNOSIS — R3915 Urgency of urination: Secondary | ICD-10-CM

## 2012-08-31 DIAGNOSIS — R351 Nocturia: Secondary | ICD-10-CM

## 2012-08-31 DIAGNOSIS — N952 Postmenopausal atrophic vaginitis: Secondary | ICD-10-CM

## 2012-08-31 LAB — ESTRADIOL: Estradiol: 82.7 pg/mL

## 2012-08-31 NOTE — Progress Notes (Signed)
Patient came to see me today for further followup. We have controlled her menopausal symptoms with Estrogel. She uses one spray on  both arms daily. Recently however she started to have severe hot flashes again. Although she has a history of TIA's after consultation with her PCP and in light of the fact that she remains fully anticoagulated we have continued her estrogen since it is nonoral. She has a cystocele with nocturia but does not feel it is symptomatic enough  to require intervention. She is having no vaginal bleeding. She has atrophic vaginitis but the estrogen is helping with that as well. 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 She does not require other medication for that. She has always had normal Pap smears. She is status post a vaginal hysterectomy done in 1983 for dysfunctional uterine bleeding. Her last Pap smear was 2011. She had a normal mammogram this Summer. She does her bone densities through PCP and are normal.  ROS: 12 system review done. Pertinent positives above. Other positives include hyperlipidemia, irritable bowel syndrome, GERD, COPD and rhinitis.  HEENT: Within normal limits.Beola Cord present. Neck: No masses. Supraclavicular lymph nodes: Not enlarged. Breasts: Examined in both sitting and lying position. Symmetrical without skin changes or masses. Abdomen: Soft no masses guarding or rebound. No hernias. Pelvic: External within normal limits. BUS within normal limits. Vaginal examination shows good estrogen effect, first degree cystocele  no enterocele or rectocele. Cervix and uterus absent. Adnexa within normal limits. Rectovaginal confirmatory. Extremities within normal limits.  Assessment: #1. Menopausal symptoms #2. Atrophic vaginitis #3. Detrussor instability #4. Cystocele  Plan: For the moment she will continue her Estrogel. She will try to find other non oral estrogens  that might be cheaper. We checked an estradiol level. I wonder if her hot flashes are not hormonally related. She will continue yearly mammograms. Pap not done.The new Pap smear guidelines were discussed with the patient.

## 2012-08-31 NOTE — Patient Instructions (Signed)
We will call you with estradiol results.

## 2012-09-03 ENCOUNTER — Telehealth: Payer: Self-pay | Admitting: *Deleted

## 2012-09-03 ENCOUNTER — Telehealth: Payer: Self-pay | Admitting: Critical Care Medicine

## 2012-09-03 MED ORDER — ESTRADIOL 0.05 MG/24HR TD PTWK: 1.0000 | MEDICATED_PATCH | TRANSDERMAL | Status: DC

## 2012-09-03 NOTE — Telephone Encounter (Signed)
Estradiol 0.05 mg patch one weekly. Give her the 1 that is generic for [place]. Tell her to apply it to buttock and move  every week. Give her a year worth of  refills.

## 2012-09-03 NOTE — Addendum Note (Signed)
Addended by: Richardson Chiquito on: 09/03/2012 02:48 PM   Modules accepted: Orders

## 2012-09-03 NOTE — Telephone Encounter (Signed)
I spoke with pt and she stated she has noticed she is more SOB w/  Exertion. Offered OV today/tomorrow but refused stating no way she can make it in due to her husband has a pre-op appt today and bx tomorrow. She stated this is not an emergency bc this has been going on for a while. She is scheduled to see [facility] on wed 09/05/12 at 9:45. Pt aware to call us if she needs [place] before then. She voiced her understanding. Will forward to PW as an [place]

## 2012-09-03 NOTE — Telephone Encounter (Signed)
Pt was to let you know a cheaper medication for her through her insurance. She uses a Mail order service and she states the Estradiol 0.05 mg Patch is what she wants. PLease advise

## 2012-09-03 NOTE — Telephone Encounter (Signed)
Needs to be seen in office  Keep 10/2 Appt

## 2012-09-04 ENCOUNTER — Ambulatory Visit (INDEPENDENT_AMBULATORY_CARE_PROVIDER_SITE_OTHER): Payer: Medicare Other

## 2012-09-04 DIAGNOSIS — Z7901 Long term (current) use of anticoagulants: Secondary | ICD-10-CM

## 2012-09-04 DIAGNOSIS — Z8679 Personal history of other diseases of the circulatory system: Secondary | ICD-10-CM

## 2012-09-04 DIAGNOSIS — G459 Transient cerebral ischemic attack, unspecified: Secondary | ICD-10-CM

## 2012-09-04 LAB — POCT INR: INR: 1.9

## 2012-09-05 ENCOUNTER — Other Ambulatory Visit: Payer: Self-pay | Admitting: Pharmacist

## 2012-09-05 ENCOUNTER — Ambulatory Visit (INDEPENDENT_AMBULATORY_CARE_PROVIDER_SITE_OTHER): Payer: Medicare Other | Admitting: Pulmonary Disease

## 2012-09-05 ENCOUNTER — Encounter: Payer: Self-pay | Admitting: Pulmonary Disease

## 2012-09-05 VITALS — BP 138/72 | HR 85 | Ht 63.0 in | Wt 156.4 lb

## 2012-09-05 DIAGNOSIS — J441 Chronic obstructive pulmonary disease with (acute) exacerbation: Secondary | ICD-10-CM

## 2012-09-05 DIAGNOSIS — J449 Chronic obstructive pulmonary disease, unspecified: Secondary | ICD-10-CM

## 2012-09-05 MED ORDER — PREDNISONE 10 MG PO TABS: ORAL_TABLET | ORAL | Status: DC

## 2012-09-05 MED ORDER — WARFARIN SODIUM 5 MG PO TABS: 5.0000 mg | ORAL_TABLET | ORAL | Status: DC

## 2012-09-05 NOTE — Assessment & Plan Note (Signed)
The patient's history is most consistent with an acute COPD exacerbation.  I will treat her with a short course of prednisone to get her through this episode.  There is nothing to suggest an acute infection or other superimposed process at this time.  She is to call us if she does not see an improvement in 48 hours.

## 2012-09-05 NOTE — Progress Notes (Signed)
  Subjective:    Patient ID: [name], female    DOB: 29-Nov-1941, 71 y.o.   MRN: [id number]  HPI The patient comes in today for an acute sick visit.  She has known COPD, and is usually followed by Dr. Delford Field.  She was doing very well at her recent visit this summer, but notes a few day history of increasing dyspnea on exertion.  When she returns from her exercise regimen, she feels that she is gasping for breath.  She does see improvement with her rescue inhaler.  She has gotten to the point that she feels dyspneic even with easier activities around her house.  She denies any chest congestion, cough, or purulent mucus.  She has not had any chest pressure, pleuritic chest pain, or lower extremity edema.   Review of Systems  Constitutional: Negative for fever and unexpected weight change.  HENT: Positive for rhinorrhea. Negative for ear pain, nosebleeds, congestion, sore throat, sneezing, trouble swallowing, dental problem, postnasal drip and sinus pressure.   Eyes: Negative for redness and itching.  Respiratory: Positive for shortness of breath and wheezing. Negative for cough and chest tightness.   Cardiovascular: Negative for palpitations and leg swelling.  Gastrointestinal: Negative for nausea and vomiting.  Genitourinary: Negative for dysuria.  Musculoskeletal: Negative for joint swelling.  Skin: Negative for rash.  Neurological: Negative for headaches.  Hematological: Bruises/bleeds easily.  Psychiatric/Behavioral: Negative for dysphoric mood. The patient is not nervous/anxious.        Objective:   Physical Exam Well-developed female in no acute distress Nose without purulent or discharge noted Oropharynx clear Neck without lymphadenopathy or thyromegaly Chest with mildly decreased breath sounds, a few wheezes noted, coarse breath sounds throughout Cardiac exam with regular rate and rhythm Lower extremities without edema, no cyanosis Alert and oriented, moves all 4  extremities.       Assessment & Plan:

## 2012-09-05 NOTE — Patient Instructions (Addendum)
Will treat with a short course of prednisone. Continue with your current breathing medications. Keep followup with Dr. Delford Field, but call if you are not getting better.

## 2012-09-11 ENCOUNTER — Ambulatory Visit (INDEPENDENT_AMBULATORY_CARE_PROVIDER_SITE_OTHER): Payer: Medicare Other | Admitting: Internal Medicine

## 2012-09-11 ENCOUNTER — Other Ambulatory Visit (INDEPENDENT_AMBULATORY_CARE_PROVIDER_SITE_OTHER): Payer: Medicare Other

## 2012-09-11 ENCOUNTER — Encounter: Payer: Self-pay | Admitting: Internal Medicine

## 2012-09-11 VITALS — BP 122/80 | HR 78 | Temp 97.2°F | Ht 63.0 in | Wt 156.2 lb

## 2012-09-11 DIAGNOSIS — Z79899 Other long term (current) drug therapy: Secondary | ICD-10-CM

## 2012-09-11 DIAGNOSIS — Z0000 Encounter for general adult medical examination without abnormal findings: Secondary | ICD-10-CM

## 2012-09-11 DIAGNOSIS — Z23 Encounter for immunization: Secondary | ICD-10-CM

## 2012-09-11 LAB — CBC WITH DIFFERENTIAL/PLATELET
Basophils Absolute: 0 10*3/uL (ref 0.0–0.1)
Basophils Relative: 0.3 % (ref 0.0–3.0)
Eosinophils Absolute: 0 10*3/uL (ref 0.0–0.7)
Eosinophils Relative: 0.3 % (ref 0.0–5.0)
HCT: 45.2 % (ref 36.0–46.0)
Hemoglobin: 15 g/dL (ref 12.0–15.0)
Lymphocytes Relative: 17.4 % (ref 12.0–46.0)
Lymphs Abs: 1.1 10*3/uL (ref 0.7–4.0)
MCHC: 33.2 g/dL (ref 30.0–36.0)
MCV: 87.8 fl (ref 78.0–100.0)
Monocytes Absolute: 0.5 10*3/uL (ref 0.1–1.0)
Monocytes Relative: 8.6 % (ref 3.0–12.0)
Neutro Abs: 4.5 10*3/uL (ref 1.4–7.7)
Neutrophils Relative %: 73.4 % (ref 43.0–77.0)
Platelets: 198 10*3/uL (ref 150.0–400.0)
RBC: 5.15 Mil/uL — ABNORMAL HIGH (ref 3.87–5.11)
RDW: 14.6 % (ref 11.5–14.6)
WBC: 6.1 10*3/uL (ref 4.5–10.5)

## 2012-09-11 LAB — URINALYSIS, ROUTINE W REFLEX MICROSCOPIC
Bilirubin Urine: NEGATIVE
Hgb urine dipstick: NEGATIVE
Ketones, ur: NEGATIVE
Leukocytes, UA: NEGATIVE
Nitrite: NEGATIVE
Specific Gravity, Urine: 1.01 (ref 1.000–1.030)
Total Protein, Urine: NEGATIVE
Urine Glucose: NEGATIVE
Urobilinogen, UA: 0.2 (ref 0.0–1.0)
pH: 7 (ref 5.0–8.0)

## 2012-09-11 LAB — LIPID PANEL
Cholesterol: 231 mg/dL — ABNORMAL HIGH (ref 0–200)
HDL: 67.3 mg/dL (ref >39.00)
Total CHOL/HDL Ratio: 3
Triglycerides: 157 mg/dL — ABNORMAL HIGH (ref 0.0–149.0)
VLDL: 31.4 mg/dL (ref 0.0–40.0)

## 2012-09-11 LAB — HEPATIC FUNCTION PANEL
ALT: 21 U/L (ref 0–35)
AST: 24 U/L (ref 0–37)
Albumin: 4.4 g/dL (ref 3.5–5.2)
Alkaline Phosphatase: 72 U/L (ref 39–117)
Bilirubin, Direct: 0.2 mg/dL (ref 0.0–0.3)
Total Bilirubin: 0.9 mg/dL (ref 0.3–1.2)
Total Protein: 7.3 g/dL (ref 6.0–8.3)

## 2012-09-11 LAB — BASIC METABOLIC PANEL
BUN: 20 mg/dL (ref 6–23)
CO2: 29 mEq/L (ref 19–32)
Calcium: 9.8 mg/dL (ref 8.4–10.5)
Chloride: 100 mEq/L (ref 96–112)
Creatinine, Ser: 0.9 mg/dL (ref 0.4–1.2)
GFR: 65.21 mL/min (ref >60.00)
Glucose, Bld: 100 mg/dL — ABNORMAL HIGH (ref 70–99)
Potassium: 4.6 mEq/L (ref 3.5–5.1)
Sodium: 137 mEq/L (ref 135–145)

## 2012-09-11 LAB — TSH: TSH: 0.82 u[IU]/mL (ref 0.35–5.50)

## 2012-09-11 MED ORDER — AZELASTINE HCL 0.1 % NA SOLN: 2.0000 | Freq: Every day | NASAL | Status: DC

## 2012-09-11 MED ORDER — OMEPRAZOLE 20 MG PO CPDR: 20.0000 mg | DELAYED_RELEASE_CAPSULE | Freq: Every day | ORAL | Status: DC

## 2012-09-11 NOTE — Patient Instructions (Addendum)
You had the flu shot today Ok to take the Lipitor 20 mg every other day Continue all other medications as before Your refills were done as requested Please go to LAB in the [facility] for the blood and/or urine tests to be done today Please remember to sign up for My Chart at your earliest convenience, as this will be important to you in the future with finding out test results (I think you already are signed up) Please continue your efforts at being more active, low cholesterol diet, and weight control. You are otherwise up to date with prevention Please return in 1 year for your yearly visit, or sooner if needed, with Lab testing done 3-5 days before (if ok with your insurance)

## 2012-09-11 NOTE — Progress Notes (Signed)
Subjective:    Patient ID: [name], female    DOB: 07-17-1941, 71 y.o.   MRN: [id number]  HPI  Here for wellness and f/u;  Overall doing ok;  Pt denies CP, worsening SOB, DOE, wheezing, orthopnea, PND, worsening LE edema, palpitations, dizziness or syncope.  Pt denies neurological change such as new Headache, facial or extremity weakness.  Pt denies polydipsia, polyuria, or low sugar symptoms. Pt states overall good compliance with treatment and medications, good tolerability, and trying to follow lower cholesterol diet.  Pt denies worsening depressive symptoms, suicidal ideation or panic. No fever, wt loss, night sweats, loss of appetite, or other constitutional symptoms.  Pt states good ability with ADL's, low fall risk, home safety reviewed and adequate, no significant changes in hearing or vision, and trying to be regularly active with exercise.  Gained 10 lbs over 18 mo, trying to be more active, but lipitor has caused leg pain, now taking qod, doing better  Has also intermittent Right anterior thigh numbness - c/w meralgia Past Medical History  Diagnosis Date  . SBE (subacute bacterial endocarditis) prophylaxis candidate   . COPD (chronic obstructive pulmonary disease)   . GERD (gastroesophageal reflux disease)   . Fibrocystic breast disease   . HLD (hyperlipidemia)   . Osteoarthritis   . IBS (irritable bowel syndrome)   . Hemorrhoids   . History of cerebrovascular accident   . TIA (transient ischemic attack)     anticoagulation therapy on coumadin  . Nephrolithiasis     hx  . HTN (hypertension)   . Stroke     STROKES  . DUB (dysfunctional uterine bleeding)   . Atrophic vaginitis   . Menopausal symptoms   . DI (detrusor instability)   . Basal cell carcinoma    Past Surgical History  Procedure Date  . Hysterectomy (other)   . Bladder suspension   . Excision of basal cell ca 2011  . Thumb surgery   . Rectocele repair 2008    A&P REPAIR -DR. MCDIARMID  . Anterior and  posterior vaginal repair 2008    A&P REPAIR-DR MCDIARMID  . Vaginal hysterectomy 1983    NO BSO-DR. MABRY    reports that she quit smoking about 16 years ago. Her smoking use included Cigarettes. She has a 60 pack-year smoking history. She has never used smokeless tobacco. She reports that she drinks alcohol. She reports that she does not use illicit drugs. family history includes Breast cancer in her maternal aunts and mother; COPD in her mother; Cancer in her mother; and Uterine cancer in her mother. Allergies  Allergen Reactions  . Clarithromycin     REACTION: possible rash but could have been legionarres dz with rash  . Clarithromycin   . Crestor (Rosuvastatin Calcium)     fagitue and joint pain   Current Outpatient Prescriptions on File Prior to Visit  Medication Sig Dispense Refill  . albuterol (PROAIR HFA) 108 (90 BASE) MCG/ACT inhaler Inhale into the lungs. 1-2 puffs every 4-6 hours as needed       . amLODipine (NORVASC) 5 MG tablet Take 1 tablet (5 mg total) by mouth daily.  90 tablet  3  . azelastine (ASTELIN) 137 MCG/SPRAY nasal spray Place 2 sprays into the nose daily. Use in each nostril as directed  30 mL  0  . benzonatate (TESSALON) 100 MG capsule Take 100 mg by mouth 3 (three) times daily as needed.      . budesonide-formoterol (SYMBICORT) 160-4.5 MCG/ACT inhaler Inhale  2 puffs into the lungs 2 (two) times daily.  1 Inhaler  5  . Calcium Carbonate-Vitamin D (CALCIUM + D PO) Take 1 tablet by mouth 2 (two) times daily.       . celecoxib (CELEBREX) 100 MG capsule Take 100 mg by mouth daily.       . clobetasol (TEMOVATE) 0.05 % external solution       . estradiol (CLIMARA) 0.05 mg/24hr Place 1 patch (0.05 mg total) onto the skin once a week.  12 patch  4  . fluocinonide (LIDEX) 0.05 % cream Apply topically 2 (two) times daily as needed. Use as directed twice per day as needed  30 g  1  . HYDROcodone-homatropine (HYCODAN) 5-1.5 MG/5ML syrup Take 5 mLs by mouth every 6 (six)  hours as needed.      . irbesartan (AVAPRO) 150 MG tablet Take 1 tablet (150 mg total) by mouth daily.  90 tablet  3  . Multiple Vitamin (MULTIVITAMIN) tablet Take 1 tablet by mouth daily.        Marland Kitchen omeprazole (PRILOSEC) 20 MG capsule Take 1 capsule (20 mg total) by mouth daily.  90 capsule  0  . predniSONE (DELTASONE) 10 MG tablet Take 4 tabletsx2days,then 3 tabletsx2days, then 2 tablets x2days, then 1tabletx2days then STOP  20 tablet  0  . scopolamine (TRANSDERM-SCOP) 1.5 MG Place 1 patch onto the skin as needed.       . warfarin (COUMADIN) 5 MG tablet Take 1 tablet (5 mg total) by mouth as directed.  90 tablet  1  . DISCONTD: valsartan (DIOVAN) 160 MG tablet Take 1 tablet (160 mg total) by mouth daily.  90 tablet  3   Review of Systems Review of Systems  Constitutional: Negative for diaphoresis, activity change, appetite change and unexpected weight change.  HENT: Negative for hearing loss, ear pain, facial swelling, mouth sores and neck stiffness.   Eyes: Negative for pain, redness and visual disturbance.  Respiratory: Negative for shortness of breath and wheezing.   Cardiovascular: Negative for chest pain and palpitations.  Gastrointestinal: Negative for diarrhea, blood in stool, abdominal distention and rectal pain.  Genitourinary: Negative for hematuria, flank pain and decreased urine volume.  Musculoskeletal: Negative for myalgias and joint swelling.  Skin: Negative for color change and wound.  Neurological: Negative for syncope and numbness.  Hematological: Negative for adenopathy.  Psychiatric/Behavioral: Negative for hallucinations, self-injury, decreased concentration and agitation.      Objective:   Physical Exam BP 122/80  Pulse 78  Temp 97.2 F (36.2 C) (Oral)  Ht 5\' 3"  (1.6 m)  Wt 156 lb 4 oz (70.875 kg)  BMI 27.68 kg/m2  SpO2 94% Physical Exam  VS noted Constitutional: Pt is oriented to person, place, and time. Appears well-developed and well-nourished.  HENT:    Head: Normocephalic and atraumatic.  Right Ear: External ear normal.  Left Ear: External ear normal.  Nose: Nose normal.  Mouth/Throat: Oropharynx is clear and moist.  Eyes: Conjunctivae and EOM are normal. Pupils are equal, round, and reactive to light.  Neck: Normal range of motion. Neck supple. No JVD present. No tracheal deviation present.  Cardiovascular: Normal rate, regular rhythm, normal heart sounds and intact distal pulses.   Pulmonary/Chest: Effort normal and breath sounds normal.  Abdominal: Soft. Bowel sounds are normal. There is no tenderness.  Musculoskeletal: Normal range of motion. Exhibits no edema.  Lymphadenopathy:  Has no cervical adenopathy.  Neurological: Pt is alert and oriented to person, place, and time.  Pt has normal reflexes. No cranial nerve deficit.  Skin: Skin is warm and dry. No rash noted.  Psychiatric:  Has  normal mood and affect. Behavior is normal. except 1+ nervous    Assessment & Plan:

## 2012-09-13 ENCOUNTER — Telehealth: Payer: Self-pay | Admitting: Internal Medicine

## 2012-09-13 ENCOUNTER — Encounter: Payer: Self-pay | Admitting: Internal Medicine

## 2012-09-13 LAB — LDL CHOLESTEROL, DIRECT: Direct LDL: 135 mg/dL

## 2012-09-13 MED ORDER — ATORVASTATIN CALCIUM 40 MG PO TABS: 40.0000 mg | ORAL_TABLET | Freq: Every day | ORAL | Status: DC

## 2012-09-13 NOTE — Assessment & Plan Note (Signed)

## 2012-09-13 NOTE — Telephone Encounter (Signed)
[name] - pt is already taking lipitor 20 mg  OK to increase to 40 mg since the LDL cont's to be high - done to [facility]

## 2012-09-14 ENCOUNTER — Telehealth: Payer: Self-pay | Admitting: Internal Medicine

## 2012-09-14 MED ORDER — ATORVASTATIN CALCIUM 40 MG PO TABS: 40.0000 mg | ORAL_TABLET | ORAL | Status: DC

## 2012-09-14 NOTE — Telephone Encounter (Signed)
Called left message to call back 

## 2012-09-14 NOTE — Telephone Encounter (Signed)
Ok for qod - med list adjusted

## 2012-09-14 NOTE — Telephone Encounter (Signed)
The patient would like to know if ok to take the 40 mg every other day due to cramps

## 2012-10-09 ENCOUNTER — Encounter (INDEPENDENT_AMBULATORY_CARE_PROVIDER_SITE_OTHER): Payer: Medicare Other | Admitting: General Practice

## 2012-10-09 DIAGNOSIS — Z7901 Long term (current) use of anticoagulants: Secondary | ICD-10-CM

## 2012-10-09 DIAGNOSIS — Z8679 Personal history of other diseases of the circulatory system: Secondary | ICD-10-CM

## 2012-10-09 DIAGNOSIS — G459 Transient cerebral ischemic attack, unspecified: Secondary | ICD-10-CM

## 2012-10-09 LAB — POCT INR: INR: 1.9

## 2012-10-21 ENCOUNTER — Other Ambulatory Visit: Payer: Self-pay | Admitting: Internal Medicine

## 2012-10-21 MED ORDER — OMEPRAZOLE 20 MG PO CPDR: 20.0000 mg | DELAYED_RELEASE_CAPSULE | Freq: Every day | ORAL | Status: DC

## 2012-10-29 ENCOUNTER — Ambulatory Visit (INDEPENDENT_AMBULATORY_CARE_PROVIDER_SITE_OTHER): Payer: Medicare Other | Admitting: General Practice

## 2012-10-29 DIAGNOSIS — G459 Transient cerebral ischemic attack, unspecified: Secondary | ICD-10-CM

## 2012-10-29 DIAGNOSIS — Z7901 Long term (current) use of anticoagulants: Secondary | ICD-10-CM

## 2012-10-29 DIAGNOSIS — Z8679 Personal history of other diseases of the circulatory system: Secondary | ICD-10-CM

## 2012-10-29 LAB — POCT INR: INR: 2.2

## 2012-11-21 ENCOUNTER — Ambulatory Visit: Payer: Medicare Other | Admitting: Critical Care Medicine

## 2012-11-23 ENCOUNTER — Ambulatory Visit (INDEPENDENT_AMBULATORY_CARE_PROVIDER_SITE_OTHER): Payer: Medicare Other | Admitting: Critical Care Medicine

## 2012-11-23 ENCOUNTER — Encounter: Payer: Self-pay | Admitting: Critical Care Medicine

## 2012-11-23 VITALS — BP 122/72 | HR 94 | Temp 97.6°F | Ht 63.0 in | Wt 152.2 lb

## 2012-11-23 DIAGNOSIS — J449 Chronic obstructive pulmonary disease, unspecified: Secondary | ICD-10-CM

## 2012-11-23 MED ORDER — BUDESONIDE-FORMOTEROL FUMARATE 160-4.5 MCG/ACT IN AERO: 2.0000 | INHALATION_SPRAY | Freq: Two times a day (BID) | RESPIRATORY_TRACT | Status: DC

## 2012-11-23 MED ORDER — MONTELUKAST SODIUM 10 MG PO TABS: 10.0000 mg | ORAL_TABLET | Freq: Every day | ORAL | Status: DC

## 2012-11-23 NOTE — Progress Notes (Signed)
Subjective:    Patient ID: [name], female    DOB: [birth year], 71 y.o.   MRN: [id number]  HPI  71 y.o.  WF Copd   07/24/2012 Doing better this summer.  Not much cough. Still dyspneic with heavy exertion.  No chest pain.  Now just on symbicort Pt denies any significant sore throat, nasal congestion or excess secretions, fever, chills, sweats, unintended weight loss, pleurtic or exertional chest pain, orthopnea PND, or leg swelling Pt denies any increase in rescue therapy over baseline, denies waking up needing it or having any early am or nocturnal exacerbations of coughing/wheezing/or dyspnea. Pt also denies any obvious fluctuation in symptoms with  weather or environmental change or other alleviating or aggravating factors  11/23/2012 Pt was seen 09/04/12 by [facility] with flare and rx pred x 8days and helped.  Since that time doing well. Pt notes a taste in the mouth and cough a lot, but now is better.  No heartburn. On PPI Not that dyspneic unless heavy exertion. Pt denies any significant sore throat, nasal congestion or excess secretions, fever, chills, sweats, unintended weight loss, pleurtic or exertional chest pain, orthopnea PND, or leg swelling Pt denies any increase in rescue therapy over baseline, denies waking up needing it or having any early am or nocturnal exacerbations of coughing/wheezing/or dyspnea. Pt also denies any obvious fluctuation in symptoms with  weather or environmental change or other alleviating or aggravating factors      Review of Systems  Constitutional:   No  weight loss, night sweats,  Fevers, chills, fatigue, lassitude. HEENT:   No headaches,  Difficulty swallowing,  Tooth/dental problems,  Sore throat,                No sneezing, itching, ear ache, nasal congestion, post nasal drip,   CV:  No chest pain,  Orthopnea, PND, swelling in lower extremities, anasarca, dizziness, palpitations  GI  No heartburn, indigestion, abdominal pain, nausea,  vomiting, diarrhea, change in bowel habits, loss of appetite  Resp: ++ shortness of breath with exertion not  at rest.  No excess mucus, no productive cough,  No non-productive cough,  No coughing up of blood.  No change in color of mucus.  No wheezing.  No chest wall deformity  Skin: no rash or lesions.  GU: no dysuria, change in color of urine, no urgency or frequency.  No flank pain.  MS:  No joint pain or swelling.  No decreased range of motion.  No back pain.  Psych:  No change in mood or affect. No depression or anxiety.  No memory loss.     Objective:   Physical Exam  Filed Vitals:   11/23/12 0940  BP: 122/72  Pulse: 94  Temp: 97.6 F (36.4 C)  TempSrc: Oral  Height: 5\' 3"  (1.6 m)  Weight: 152 lb 3.2 oz (69.037 kg)  SpO2: 96%    Gen: Pleasant, well-nourished, in no distress,  normal affect  ENT: No lesions,  mouth clear,  oropharynx clear, no postnasal drip  Neck: No JVD, no TMG, no carotid bruits  Lungs: No use of accessory muscles, no dullness to percussion, distant breath sounds  Cardiovascular: RRR, heart sounds normal, no murmur or gallops, no peripheral edema  Abdomen: soft and NT, no HSM,  BS normal  Musculoskeletal: No deformities, no cyanosis or clubbing  Neuro: alert, non focal  Skin: Warm, no lesions or rashes  No results found.        Assessment & Plan:  COPD Stable COPD Plan No change in inhaled or maintenance medications. Return in   4 months    Updated Medication List Outpatient Encounter Prescriptions as of 11/23/2012  Medication Sig Dispense Refill  . albuterol (PROAIR HFA) 108 (90 BASE) MCG/ACT inhaler Inhale into the lungs. 1-2 puffs every 4-6 hours as needed       . amLODipine (NORVASC) 5 MG tablet Take 1 tablet (5 mg total) by mouth daily.  90 tablet  3  . benzonatate (TESSALON) 100 MG capsule Take 100 mg by mouth 3 (three) times daily as needed.      . budesonide-formoterol (SYMBICORT) 160-4.5 MCG/ACT inhaler Inhale 2  puffs into the lungs 2 (two) times daily.  1 Inhaler  0  . Calcium Carbonate-Vitamin D (CALCIUM + D PO) Take 1 tablet by mouth 2 (two) times daily.       . celecoxib (CELEBREX) 100 MG capsule Take 100 mg by mouth daily.       . Estradiol 0.75 MG/1.25 GM (0.06%) topical gel Place 1.25 g onto the skin as directed.      . fluocinonide (LIDEX) 0.05 % cream Apply topically 2 (two) times daily as needed. Use as directed twice per day as needed  30 g  1  . HYDROcodone-homatropine (HYCODAN) 5-1.5 MG/5ML syrup Take 5 mLs by mouth every 6 (six) hours as needed.      . irbesartan (AVAPRO) 150 MG tablet Take 1 tablet (150 mg total) by mouth daily.  90 tablet  3  . montelukast (SINGULAIR) 10 MG tablet Take 1 tablet (10 mg total) by mouth daily.  30 tablet  0  . Multiple Vitamin (MULTIVITAMIN) tablet Take 1 tablet by mouth daily.        Marland Kitchen omeprazole (PRILOSEC) 20 MG capsule Take 1 capsule (20 mg total) by mouth daily.  90 capsule  3  . scopolamine (TRANSDERM-SCOP) 1.5 MG Place 1 patch onto the skin as needed.       . warfarin (COUMADIN) 5 MG tablet Take 1 tablet (5 mg total) by mouth as directed.  90 tablet  1  . [DISCONTINUED] azelastine (ASTELIN) 137 MCG/SPRAY nasal spray Place 2 sprays into the nose daily. Use in each nostril as directed  30 mL  0  . [DISCONTINUED] budesonide-formoterol (SYMBICORT) 160-4.5 MCG/ACT inhaler Inhale 2 puffs into the lungs 2 (two) times daily.  1 Inhaler  5  . [DISCONTINUED] montelukast (SINGULAIR) 10 MG tablet Take 10 mg by mouth daily.      Marland Kitchen estradiol (CLIMARA) 0.05 mg/24hr Place 1 patch (0.05 mg total) onto the skin once a week.  12 patch  4  . [DISCONTINUED] atorvastatin (LIPITOR) 40 MG tablet Take 1 tablet (40 mg total) by mouth every other day.  45 tablet  3  . [DISCONTINUED] clobetasol (TEMOVATE) 0.05 % external solution as needed.       . [DISCONTINUED] Estradiol 0.25 MG/0.25GM GEL Place onto the skin as directed.      . [DISCONTINUED] predniSONE (DELTASONE) 10 MG tablet  Take 4 tabletsx2days,then 3 tabletsx2days, then 2 tablets x2days, then 1tabletx2days then STOP  20 tablet  0

## 2012-11-23 NOTE — Assessment & Plan Note (Signed)
Stable COPD Plan No change in inhaled or maintenance medications. Return in   4 months

## 2012-11-23 NOTE — Patient Instructions (Addendum)
Refills sent to pharmacy Stop astelin, use singulair for nasal drainage Return 4 months

## 2012-11-30 ENCOUNTER — Ambulatory Visit (INDEPENDENT_AMBULATORY_CARE_PROVIDER_SITE_OTHER): Payer: Medicare Other | Admitting: General Practice

## 2012-11-30 DIAGNOSIS — Z8679 Personal history of other diseases of the circulatory system: Secondary | ICD-10-CM

## 2012-11-30 DIAGNOSIS — G459 Transient cerebral ischemic attack, unspecified: Secondary | ICD-10-CM

## 2012-11-30 DIAGNOSIS — Z7901 Long term (current) use of anticoagulants: Secondary | ICD-10-CM

## 2012-11-30 LAB — POCT INR: INR: 2.9

## 2012-12-13 ENCOUNTER — Telehealth: Payer: Self-pay | Admitting: Critical Care Medicine

## 2012-12-13 MED ORDER — BUDESONIDE-FORMOTEROL FUMARATE 160-4.5 MCG/ACT IN AERO: 2.0000 | INHALATION_SPRAY | Freq: Two times a day (BID) | RESPIRATORY_TRACT | Status: DC

## 2012-12-13 MED ORDER — MONTELUKAST SODIUM 10 MG PO TABS: 10.0000 mg | ORAL_TABLET | Freq: Every day | ORAL | Status: DC

## 2012-12-13 NOTE — Telephone Encounter (Signed)
Called, spoke with pt.  Her insurance changed at the beginning of the year.  She is requesting symbicort and singulair rxs to be sent to [facility].  Rxs sent -- pt aware and voiced no further questions or concerns at this time.

## 2012-12-26 ENCOUNTER — Ambulatory Visit (INDEPENDENT_AMBULATORY_CARE_PROVIDER_SITE_OTHER): Payer: Medicare PPO | Admitting: General Practice

## 2012-12-26 DIAGNOSIS — Z8679 Personal history of other diseases of the circulatory system: Secondary | ICD-10-CM

## 2012-12-26 DIAGNOSIS — G459 Transient cerebral ischemic attack, unspecified: Secondary | ICD-10-CM

## 2012-12-26 DIAGNOSIS — Z7901 Long term (current) use of anticoagulants: Secondary | ICD-10-CM

## 2012-12-26 LAB — POCT INR: INR: 2.5

## 2013-01-15 ENCOUNTER — Telehealth: Payer: Self-pay | Admitting: Internal Medicine

## 2013-01-15 MED ORDER — OMEPRAZOLE 20 MG PO CPDR: 20.0000 mg | DELAYED_RELEASE_CAPSULE | Freq: Every day | ORAL | Status: DC

## 2013-01-15 MED ORDER — IRBESARTAN 150 MG PO TABS: 150.0000 mg | ORAL_TABLET | Freq: Every day | ORAL | Status: DC

## 2013-01-15 NOTE — Telephone Encounter (Signed)
Patient needs a new script for omeprazole and irbesartan sent to [facility], call patient with questions

## 2013-01-15 NOTE — Telephone Encounter (Signed)
refills  

## 2013-01-21 ENCOUNTER — Encounter: Payer: Self-pay | Admitting: Internal Medicine

## 2013-01-21 ENCOUNTER — Ambulatory Visit (INDEPENDENT_AMBULATORY_CARE_PROVIDER_SITE_OTHER): Payer: Medicare PPO | Admitting: Internal Medicine

## 2013-01-21 VITALS — BP 130/80 | HR 58 | Temp 97.9°F | Ht 63.5 in | Wt 150.4 lb

## 2013-01-21 DIAGNOSIS — J441 Chronic obstructive pulmonary disease with (acute) exacerbation: Secondary | ICD-10-CM | POA: Insufficient documentation

## 2013-01-21 DIAGNOSIS — M545 Low back pain, unspecified: Secondary | ICD-10-CM | POA: Insufficient documentation

## 2013-01-21 DIAGNOSIS — E785 Hyperlipidemia, unspecified: Secondary | ICD-10-CM

## 2013-01-21 DIAGNOSIS — I10 Essential (primary) hypertension: Secondary | ICD-10-CM

## 2013-01-21 MED ORDER — SIMVASTATIN 20 MG PO TABS: 20.0000 mg | ORAL_TABLET | Freq: Every evening | ORAL | Status: DC

## 2013-01-21 MED ORDER — PREDNISONE 10 MG PO TABS: ORAL_TABLET | ORAL | Status: DC

## 2013-01-21 MED ORDER — CYCLOBENZAPRINE HCL 5 MG PO TABS: 5.0000 mg | ORAL_TABLET | Freq: Three times a day (TID) | ORAL | Status: DC | PRN

## 2013-01-21 MED ORDER — HYDROCODONE-ACETAMINOPHEN 5-325 MG PO TABS: 1.0000 | ORAL_TABLET | Freq: Four times a day (QID) | ORAL | Status: DC | PRN

## 2013-01-21 MED ORDER — METHYLPREDNISOLONE ACETATE 80 MG/ML IJ SUSP
80.0000 mg | Freq: Once | INTRAMUSCULAR | Status: AC
  Administered 2013-01-21: 80 mg via INTRAMUSCULAR

## 2013-01-21 NOTE — Progress Notes (Signed)
Subjective:    Patient ID: [name], female    DOB: [birth year], 72 y.o.   MRN: [id number]  HPI  Here to f/u,  Pt denies chest pain, increased sob or doe, wheezing, orthopnea, PND, increased LE swelling, palpitations, dizziness or syncope except for mild wheezing/sob onset x 2-3 days.  Has some cough nonprod, but also with new left LBP x 3-4 wks, mod, intermittent, worse to lie down and better to turn over in bed, but no bowel or bladder change, fever, wt loss,  worsening LE pain/numbness/weakness, gait change or falls.  Has had myalgias with daily statin use in the past, but is willing to try qod dosing. Denies urinary symptoms such as dysuria, frequency, urgency, flank pain, hematuria or n/v, fever, chills. Has incresaed her inhaler use in the past 2 days.  No recent falls or trauma. Past Medical History  Diagnosis Date  . SBE (subacute bacterial endocarditis) prophylaxis candidate   . COPD (chronic obstructive pulmonary disease)   . GERD (gastroesophageal reflux disease)   . Fibrocystic breast disease   . HLD (hyperlipidemia)   . Osteoarthritis   . IBS (irritable bowel syndrome)   . Hemorrhoids   . History of cerebrovascular accident   . TIA (transient ischemic attack)     anticoagulation therapy on coumadin  . Nephrolithiasis     hx  . HTN (hypertension)   . Stroke     STROKES  . DUB (dysfunctional uterine bleeding)   . Atrophic vaginitis   . Menopausal symptoms   . DI (detrusor instability)   . Basal cell carcinoma    Past Surgical History  Procedure Laterality Date  . Hysterectomy (other)    . Bladder suspension    . Excision of basal cell ca  2011  . Thumb surgery    . Rectocele repair  2008    A&P REPAIR -DR. MCDIARMID  . Anterior and posterior vaginal repair  2008    A&P REPAIR-DR MCDIARMID  . Vaginal hysterectomy  1983    NO BSO-DR. MABRY    reports that she quit smoking about 17 years ago. Her smoking use included Cigarettes. She has a 60 pack-year smoking  history. She has never used smokeless tobacco. She reports that  drinks alcohol. She reports that she does not use illicit drugs. family history includes Breast cancer in her maternal aunts and mother; COPD in her mother; Cancer in her mother; and Uterine cancer in her mother. Allergies  Allergen Reactions  . Clarithromycin     REACTION: possible rash but could have been legionarres dz with rash  . Clarithromycin   . Crestor (Rosuvastatin Calcium)     fagitue and joint pain   Current Outpatient Prescriptions on File Prior to Visit  Medication Sig Dispense Refill  . albuterol (PROAIR HFA) 108 (90 BASE) MCG/ACT inhaler Inhale into the lungs. 1-2 puffs every 4-6 hours as needed       . amLODipine (NORVASC) 5 MG tablet Take 1 tablet (5 mg total) by mouth daily.  90 tablet  3  . benzonatate (TESSALON) 100 MG capsule Take 100 mg by mouth 3 (three) times daily as needed.      . budesonide-formoterol (SYMBICORT) 160-4.5 MCG/ACT inhaler Inhale 2 puffs into the lungs 2 (two) times daily.  3 Inhaler  3  . Calcium Carbonate-Vitamin D (CALCIUM + D PO) Take 1 tablet by mouth 2 (two) times daily.       . celecoxib (CELEBREX) 100 MG capsule Take 100 mg by  mouth daily.       Marland Kitchen estradiol (CLIMARA) 0.05 mg/24hr Place 1 patch (0.05 mg total) onto the skin once a week.  12 patch  4  . Estradiol 0.75 MG/1.25 GM (0.06%) topical gel Place 1.25 g onto the skin as directed.      . fluocinonide (LIDEX) 0.05 % cream Apply topically 2 (two) times daily as needed. Use as directed twice per day as needed  30 g  1  . HYDROcodone-homatropine (HYCODAN) 5-1.5 MG/5ML syrup Take 5 mLs by mouth every 6 (six) hours as needed.      . irbesartan (AVAPRO) 150 MG tablet Take 1 tablet (150 mg total) by mouth daily.  90 tablet  3  . montelukast (SINGULAIR) 10 MG tablet Take 1 tablet (10 mg total) by mouth daily.  90 tablet  3  . Multiple Vitamin (MULTIVITAMIN) tablet Take 1 tablet by mouth daily.        Marland Kitchen omeprazole (PRILOSEC) 20 MG  capsule Take 1 capsule (20 mg total) by mouth daily.  90 capsule  3  . scopolamine (TRANSDERM-SCOP) 1.5 MG Place 1 patch onto the skin as needed.       . warfarin (COUMADIN) 5 MG tablet Take 1 tablet (5 mg total) by mouth as directed.  90 tablet  1  . [DISCONTINUED] valsartan (DIOVAN) 160 MG tablet Take 1 tablet (160 mg total) by mouth daily.  90 tablet  3   No current facility-administered medications on file prior to visit.   Review of Systems  Constitutional: Negative for unexpected weight change, or unusual diaphoresis  HENT: Negative for tinnitus.   Eyes: Negative for photophobia and visual disturbance.  Respiratory: Negative for choking and stridor.   Gastrointestinal: Negative for vomiting and blood in stool.  Genitourinary: Negative for hematuria and decreased urine volume.  Musculoskeletal: Negative for acute joint swelling Skin: Negative for color change and wound.  Neurological: Negative for tremors and numbness other than noted  Psychiatric/Behavioral: Negative for decreased concentration or  hyperactivity.       Objective:   Physical Exam BP 130/80  Pulse 58  Temp(Src) 97.9 F (36.6 C) (Oral)  Ht 5' 3.5" (1.613 m)  Wt 150 lb 6 oz (68.21 kg)  BMI 26.22 kg/m2  SpO2 95% VS noted,  Constitutional: Pt appears well-developed and well-nourished.  HENT: Head: NCAT.  Right Ear: External ear normal.  Left Ear: External ear normal.  Eyes: Conjunctivae and EOM are normal. Pupils are equal, round, and reactive to light.  Neck: Normal range of motion. Neck supple.  Cardiovascular: Normal rate and regular rhythm.   Pulmonary/Chest: Effort normal and breath sounds decreased bilat with few bilat wheeze  Abd:  Soft, NT, non-distended, + BS Neurological: Pt is alert. Not confused , motor/dtr intact to LE's, gait intact Skin: Skin is warm. No erythema.  Psychiatric: Pt behavior is normal. Thought content normal.  Spine nontender Left lumbar paravertebral tender 1+    Assessment  & Plan:

## 2013-01-21 NOTE — Patient Instructions (Addendum)
You had the steroid shot today Please take all new medication as prescribed - the prednisone, the muscle relaxer, pain medication, and low dose statin (simvastatin) Please continue all other medications as before Please keep your appointments with your specialists as you have planned - Dr Delford Field Please return in 3 months, or sooner if needed, with Lab testing done 3-5 days before

## 2013-01-27 NOTE — Assessment & Plan Note (Signed)
Mild to mod, for depomedrol IM, predpak asd,  to f/u any worsening symptoms or concerns

## 2013-01-27 NOTE — Assessment & Plan Note (Signed)
C/w msk strain though cant r/o underlying DJD/DDD, for flexeril prn, pain med prn,  to f/u any worsening symptoms or concerns

## 2013-01-27 NOTE — Assessment & Plan Note (Signed)
Lab Results  Component Value Date   LDLCALC 87 07/23/2009   Pt willing to try simvastatin 20 mg qod, with f/u lab next  Visit as did not tolerate the lipitor daily

## 2013-01-27 NOTE — Assessment & Plan Note (Signed)
stable overall by history and exam, recent data reviewed with pt, and pt to continue medical treatment as before,  to f/u any worsening symptoms or concerns BP Readings from Last 3 Encounters:  01/21/13 130/80  11/23/12 122/72  09/11/12 122/80

## 2013-02-05 ENCOUNTER — Other Ambulatory Visit: Payer: Self-pay | Admitting: Internal Medicine

## 2013-02-05 ENCOUNTER — Encounter: Payer: Self-pay | Admitting: Internal Medicine

## 2013-02-05 ENCOUNTER — Ambulatory Visit (INDEPENDENT_AMBULATORY_CARE_PROVIDER_SITE_OTHER): Payer: Medicare PPO | Admitting: General Practice

## 2013-02-05 DIAGNOSIS — Z8679 Personal history of other diseases of the circulatory system: Secondary | ICD-10-CM

## 2013-02-05 DIAGNOSIS — Z7901 Long term (current) use of anticoagulants: Secondary | ICD-10-CM

## 2013-02-05 DIAGNOSIS — G459 Transient cerebral ischemic attack, unspecified: Secondary | ICD-10-CM

## 2013-02-05 LAB — POCT INR: INR: 4

## 2013-02-05 MED ORDER — MELOXICAM 15 MG PO TABS: 15.0000 mg | ORAL_TABLET | Freq: Every day | ORAL | Status: DC | PRN

## 2013-02-15 ENCOUNTER — Encounter: Payer: Self-pay | Admitting: Gastroenterology

## 2013-02-19 ENCOUNTER — Ambulatory Visit (INDEPENDENT_AMBULATORY_CARE_PROVIDER_SITE_OTHER): Payer: Medicare PPO | Admitting: General Practice

## 2013-02-19 DIAGNOSIS — G459 Transient cerebral ischemic attack, unspecified: Secondary | ICD-10-CM

## 2013-02-19 DIAGNOSIS — Z7901 Long term (current) use of anticoagulants: Secondary | ICD-10-CM

## 2013-02-19 DIAGNOSIS — Z8679 Personal history of other diseases of the circulatory system: Secondary | ICD-10-CM

## 2013-02-19 LAB — POCT INR: INR: 4.1

## 2013-03-06 ENCOUNTER — Ambulatory Visit (INDEPENDENT_AMBULATORY_CARE_PROVIDER_SITE_OTHER): Payer: Medicare PPO | Admitting: General Practice

## 2013-03-06 DIAGNOSIS — Z7901 Long term (current) use of anticoagulants: Secondary | ICD-10-CM

## 2013-03-06 DIAGNOSIS — G459 Transient cerebral ischemic attack, unspecified: Secondary | ICD-10-CM

## 2013-03-06 DIAGNOSIS — Z8679 Personal history of other diseases of the circulatory system: Secondary | ICD-10-CM

## 2013-03-06 LAB — POCT INR: INR: 2.7

## 2013-03-16 ENCOUNTER — Other Ambulatory Visit: Payer: Self-pay | Admitting: Internal Medicine

## 2013-03-18 ENCOUNTER — Telehealth: Payer: Self-pay | Admitting: Internal Medicine

## 2013-03-18 ENCOUNTER — Encounter: Payer: Self-pay | Admitting: Internal Medicine

## 2013-03-18 DIAGNOSIS — I83813 Varicose veins of bilateral lower extremities with pain: Secondary | ICD-10-CM

## 2013-03-18 NOTE — Telephone Encounter (Signed)
Patient is requesting a call back about her mychart message

## 2013-03-18 NOTE — Telephone Encounter (Signed)
The patient would like a referral for her varicose veins as her legs have gotten a lot worse.  Also Simvastatin was sent in to her pharmacy and she states she cannot take statins due to muscle pain.

## 2013-03-18 NOTE — Telephone Encounter (Signed)
I will do vein clinic referral, also I still would ask if she would try the zocor.  thanks

## 2013-03-19 NOTE — Telephone Encounter (Signed)
erx has already been done, and I mentioned the Dr Donia Ast [facility] clinic to our [facility]

## 2013-03-19 NOTE — Telephone Encounter (Signed)
Called the patient this morning and she reminded she does not want her referral to be with Dr. Arbie Cookey.  She did agree to try a #30 day supply of Zocor to [facility] [place], [place], if she tolerates ok she will call to request more to be called in.

## 2013-03-19 NOTE — Telephone Encounter (Signed)
Patient informed left detailed message 

## 2013-03-20 ENCOUNTER — Encounter: Payer: Self-pay | Admitting: Gastroenterology

## 2013-03-20 ENCOUNTER — Telehealth: Payer: Self-pay | Admitting: *Deleted

## 2013-03-20 ENCOUNTER — Ambulatory Visit (INDEPENDENT_AMBULATORY_CARE_PROVIDER_SITE_OTHER): Payer: Medicare PPO | Admitting: Gastroenterology

## 2013-03-20 VITALS — BP 120/68 | HR 80 | Ht 62.6 in | Wt 150.4 lb

## 2013-03-20 DIAGNOSIS — Z1211 Encounter for screening for malignant neoplasm of colon: Secondary | ICD-10-CM

## 2013-03-20 MED ORDER — NA SULFATE-K SULFATE-MG SULF 17.5-3.13-1.6 GM/177ML PO SOLN: 1.0000 | Freq: Once | ORAL | Status: DC

## 2013-03-20 NOTE — Telephone Encounter (Signed)
[facility]   03/20/2013    RE: [name] DOB: 07/03/1941 MRN: [id number]   Dear  Dr Jonny Ruiz   We have scheduled the above patient for an endoscopic procedure. Our records show that she is on anticoagulation therapy.   Please advise as to how long the patient may come off her therapy of Coumadin prior to the procedure, which is scheduled for 05/14/2013.  Please fax back/ or route the completed form to [name] at [phone].   Sincerely,  [name]

## 2013-03-20 NOTE — Telephone Encounter (Signed)
Faxed note per MD instructions.

## 2013-03-20 NOTE — Progress Notes (Signed)
History of Present Illness: Pleasant 72 year old white female with history of COPD, CVA, TIAs, on Coumadin, referred for screening colonoscopy. Except for occasional hemorrhoidal discomfort she has no GI complaints. Specifically, she is without change of bowel habits, abdominal pain, melena or hematochezia.  She has GERD which is well-controlled with omeprazole. Should she miss a dose she becomes rapidly symptomatic with pyrosis.    Past Medical History  Diagnosis Date  . SBE (subacute bacterial endocarditis) prophylaxis candidate   . COPD (chronic obstructive pulmonary disease)   . GERD (gastroesophageal reflux disease)   . Fibrocystic breast disease   . HLD (hyperlipidemia)   . Osteoarthritis   . IBS (irritable bowel syndrome)   . Hemorrhoids   . History of cerebrovascular accident   . TIA (transient ischemic attack)     anticoagulation therapy on coumadin  . Nephrolithiasis     hx  . HTN (hypertension)   . Stroke     STROKES  . DUB (dysfunctional uterine bleeding)   . Atrophic vaginitis   . Menopausal symptoms   . DI (detrusor instability)   . Basal cell carcinoma   . Status post dilation of esophageal narrowing   . Pneumonia    Past Surgical History  Procedure Laterality Date  . Bladder suspension    . Excision of basal cell ca  2011  . Thumb surgery Right   . Rectocele repair  2008    A&P REPAIR -DR. MCDIARMID  . Anterior and posterior vaginal repair  2008    A&P REPAIR-DR MCDIARMID  . Vaginal hysterectomy  1983    NO BSO-DR. MABRY   family history includes Breast cancer in her maternal aunt and mother; COPD in her mother; Emphysema in her mother; and Uterine cancer in her mother. Current Outpatient Prescriptions  Medication Sig Dispense Refill  . albuterol (PROAIR HFA) 108 (90 BASE) MCG/ACT inhaler Inhale into the lungs. 1-2 puffs every 4-6 hours as needed       . amLODipine (NORVASC) 5 MG tablet Take 1 tablet (5 mg total) by mouth daily.  90 tablet  3  .  benzonatate (TESSALON) 100 MG capsule Take 100 mg by mouth 3 (three) times daily as needed.      . budesonide-formoterol (SYMBICORT) 160-4.5 MCG/ACT inhaler Inhale 2 puffs into the lungs 2 (two) times daily.  3 Inhaler  3  . Calcium Carbonate-Vitamin D (CALCIUM + D PO) Take 1 tablet by mouth 2 (two) times daily.       . cyclobenzaprine (FLEXERIL) 5 MG tablet Take 1 tablet (5 mg total) by mouth 3 (three) times daily as needed for muscle spasms.  60 tablet  1  . fluocinonide (LIDEX) 0.05 % cream Apply topically 2 (two) times daily as needed. Use as directed twice per day as needed  30 g  1  . HYDROcodone-acetaminophen (NORCO/VICODIN) 5-325 MG per tablet Take 1 tablet by mouth every 6 (six) hours as needed for pain.  30 tablet  1  . irbesartan (AVAPRO) 150 MG tablet Take 1 tablet (150 mg total) by mouth daily.  90 tablet  3  . meloxicam (MOBIC) 15 MG tablet Take 1 tablet (15 mg total) by mouth daily as needed for pain.  90 tablet  3  . montelukast (SINGULAIR) 10 MG tablet Take 1 tablet (10 mg total) by mouth daily.  90 tablet  3  . Multiple Vitamin (MULTIVITAMIN) tablet Take 1 tablet by mouth daily.        Marland Kitchen omeprazole (PRILOSEC) 20 MG  capsule Take 1 capsule (20 mg total) by mouth daily.  90 capsule  3  . simvastatin (ZOCOR) 20 MG tablet TAKE 1 TABLET BY MOUTH EVERY EVENING  30 tablet  10  . warfarin (COUMADIN) 5 MG tablet Take 1 tablet (5 mg total) by mouth as directed.  90 tablet  1  . estradiol (CLIMARA) 0.05 mg/24hr Place 1 patch (0.05 mg total) onto the skin once a week.  12 patch  4  . Estradiol 0.75 MG/1.25 GM (0.06%) topical gel Place 1.25 g onto the skin as directed.      Marland Kitchen HYDROcodone-homatropine (HYCODAN) 5-1.5 MG/5ML syrup Take 5 mLs by mouth every 6 (six) hours as needed.      Marland Kitchen scopolamine (TRANSDERM-SCOP) 1.5 MG Place 1 patch onto the skin as needed.       . [DISCONTINUED] valsartan (DIOVAN) 160 MG tablet Take 1 tablet (160 mg total) by mouth daily.  90 tablet  3   No current  facility-administered medications for this visit.   Allergies as of 03/20/2013 - Review Complete 03/20/2013  Allergen Reaction Noted  . Clarithromycin  08/11/2007  . Clarithromycin  08/17/2011  . Crestor (rosuvastatin calcium)  08/11/2011    reports that she quit smoking about 17 years ago. Her smoking use included Cigarettes. She has a 60 pack-year smoking history. She has never used smokeless tobacco. She reports that  drinks alcohol. She reports that she does not use illicit drugs.     Review of Systems: Pertinent positive and negative review of systems were noted in the above HPI section. All other review of systems were otherwise negative.  Vital signs were reviewed in today's medical record Physical Exam: General: Well developed , well nourished, no acute distress Skin: anicteric Head: Normocephalic and atraumatic Eyes:  sclerae anicteric, EOMI Ears: Normal auditory acuity Mouth: No deformity or lesions Neck: Supple, no masses or thyromegaly Lungs: Clear throughout to auscultation Heart: Regular rate and rhythm; no murmurs, rubs or bruits Abdomen: Soft, non tender and non distended. No masses, hepatosplenomegaly or hernias noted. Normal Bowel sounds Rectal:deferred Musculoskeletal: Symmetrical with no gross deformities  Skin: No lesions on visible extremities Pulses:  Normal pulses noted Extremities: No clubbing, cyanosis, edema or deformities noted Neurological: Alert oriented x 4, grossly nonfocal Cervical Nodes:  No significant cervical adenopathy Inguinal Nodes: No significant inguinal adenopathy Psychological:  Alert and cooperative. Normal mood and affect

## 2013-03-20 NOTE — Assessment & Plan Note (Signed)
Patient will be scheduled for screening colonoscopy. Last exam was 10 years ago. I will check with her PCP whether Coumadin can be held. The slightly high risk for thromboembolic disease while holding Coumadin was discussed.  Risks, alternatives, and complications of the procedure, including bleeding, perforation, and possible need for surgery, were explained to the patient.  Patient's questions were answered.

## 2013-03-20 NOTE — Telephone Encounter (Signed)
OK to hold coumadin for 5 days prior to procedure  Robin to fax note

## 2013-03-20 NOTE — Patient Instructions (Addendum)
You have been scheduled for a colonoscopy with propofol. Please follow written instructions given to you at your visit today.  Please pick up your prep kit at the pharmacy within the next 1-3 days. If you use inhalers (even only as needed), please bring them with you on the day of your procedure.  You will be contaced by our office prior to your procedure for directions on holding your Coumadin/Warfarin.  If you do not hear from our office 1 week prior to your scheduled procedure, please call [phone] to discuss.   

## 2013-03-21 ENCOUNTER — Telehealth: Payer: Self-pay | Admitting: *Deleted

## 2013-03-21 NOTE — Telephone Encounter (Signed)
Pt informed ok to hold coumadin 5 days

## 2013-03-27 ENCOUNTER — Encounter: Payer: Self-pay | Admitting: Internal Medicine

## 2013-03-31 ENCOUNTER — Encounter: Payer: Self-pay | Admitting: Internal Medicine

## 2013-04-01 ENCOUNTER — Telehealth: Payer: Self-pay

## 2013-04-01 MED ORDER — AMLODIPINE BESYLATE 5 MG PO TABS: 5.0000 mg | ORAL_TABLET | Freq: Every day | ORAL | Status: DC

## 2013-04-01 NOTE — Telephone Encounter (Signed)
email refill request to [facility]

## 2013-04-03 ENCOUNTER — Ambulatory Visit (INDEPENDENT_AMBULATORY_CARE_PROVIDER_SITE_OTHER): Payer: Medicare PPO | Admitting: General Practice

## 2013-04-03 DIAGNOSIS — Z8679 Personal history of other diseases of the circulatory system: Secondary | ICD-10-CM

## 2013-04-03 DIAGNOSIS — Z7901 Long term (current) use of anticoagulants: Secondary | ICD-10-CM

## 2013-04-03 DIAGNOSIS — G459 Transient cerebral ischemic attack, unspecified: Secondary | ICD-10-CM

## 2013-04-03 LAB — POCT INR: INR: 2.5

## 2013-04-04 ENCOUNTER — Telehealth: Payer: Self-pay | Admitting: General Practice

## 2013-04-04 NOTE — Telephone Encounter (Signed)
Message copied by Garrison Columbus on Thu Apr 04, 2013  8:11 AM ------      Message from: Corwin Levins      Created: Wed Apr 03, 2013  5:04 PM       Yes please      ----- Message -----         From: Garrison Columbus, RN         Sent: 04/03/2013   1:52 PM           To: Corwin Levins, MD            Pt has colonoscopy scheduled for 6/10.  Do you want Lovenox bridge?  Thanks,  Arline Asp       ------

## 2013-05-01 ENCOUNTER — Other Ambulatory Visit: Payer: Self-pay | Admitting: General Practice

## 2013-05-01 ENCOUNTER — Ambulatory Visit (INDEPENDENT_AMBULATORY_CARE_PROVIDER_SITE_OTHER): Payer: Medicare PPO | Admitting: General Practice

## 2013-05-01 DIAGNOSIS — G459 Transient cerebral ischemic attack, unspecified: Secondary | ICD-10-CM

## 2013-05-01 DIAGNOSIS — Z7901 Long term (current) use of anticoagulants: Secondary | ICD-10-CM

## 2013-05-01 DIAGNOSIS — Z8679 Personal history of other diseases of the circulatory system: Secondary | ICD-10-CM

## 2013-05-01 LAB — POCT INR: INR: 2.8

## 2013-05-01 MED ORDER — ENOXAPARIN SODIUM 100 MG/ML ~~LOC~~ SOLN: 100.0000 mg | Freq: Every day | SUBCUTANEOUS | Status: DC

## 2013-05-01 NOTE — Patient Instructions (Signed)
6-5 - Take last dose of coumadin before procedure. 6-6 - Do not take coumadin and Do not take Lovenox 6-7 - Lovenox in AM (No coumadin) 6-8 - Lovenox in AM (No coumadin) 6-9 - Lovenox in AM (No coumadin) 6-10 - Procedure (Do not take anything) 6-11 - 7.5 mg of coumadin and Lovenox in AM 6-12 - 7.5 mg of coumadin and Lovenox in AM 6-13 - 5 mg of coumadin and Lovenox in AM 6-14 - 5 mg of coumadin and Lovenox in AM 6-15 - 5 mg of coumadin and Lovenox in AM 6-16 - 5 mg of coumadin and Lovenox in AM 6-17 - Re-check in Clinic.

## 2013-05-09 ENCOUNTER — Encounter: Payer: Self-pay | Admitting: Internal Medicine

## 2013-05-14 ENCOUNTER — Encounter: Payer: Self-pay | Admitting: Gastroenterology

## 2013-05-14 ENCOUNTER — Ambulatory Visit (AMBULATORY_SURGERY_CENTER): Payer: Medicare PPO | Admitting: Gastroenterology

## 2013-05-14 VITALS — BP 152/77 | HR 67 | Temp 97.4°F | Resp 20 | Ht 62.0 in | Wt 150.0 lb

## 2013-05-14 DIAGNOSIS — Z1211 Encounter for screening for malignant neoplasm of colon: Secondary | ICD-10-CM

## 2013-05-14 DIAGNOSIS — K5730 Diverticulosis of large intestine without perforation or abscess without bleeding: Secondary | ICD-10-CM

## 2013-05-14 MED ORDER — SODIUM CHLORIDE 0.9 % IV SOLN: 500.0000 mL | INTRAVENOUS | Status: DC

## 2013-05-14 NOTE — Op Note (Signed)
[facility] [address] [place], [address]   COLONOSCOPY PROCEDURE REPORT  PATIENT: Lauren, Ray  MR#: [id number] BIRTHDATE: 1941/03/24 , 71  yrs. old GENDER: Female ENDOSCOPIST: Louis Meckel, MD REFERRED BY: PROCEDURE DATE:  05/14/2013 PROCEDURE:   Colonoscopy, diagnostic ASA CLASS:   Class II INDICATIONS: MEDICATIONS: MAC sedation, administered by CRNA and propofol (Diprivan) 300mg  IV  DESCRIPTION OF PROCEDURE:   After the risks benefits and alternatives of the procedure were thoroughly explained, informed consent was obtained.  A digital rectal exam revealed no abnormalities of the rectum.   The LB [id number]  endoscope was introduced through the anus and advanced to the cecum, which was identified by both the appendix and ileocecal valve. No adverse events experienced.   The quality of the prep was excellent using Suprep  The instrument was then slowly withdrawn as the colon was fully examined.      COLON FINDINGS: Mild diverticulosis was noted in the sigmoid colon. The colon mucosa was otherwise normal.  Retroflexed views revealed no abnormalities. The time to cecum=4 minutes 47 seconds. Withdrawal time=6 minutes 0 seconds.  The scope was withdrawn and the procedure completed. COMPLICATIONS: There were no complications.  ENDOSCOPIC IMPRESSION: 1.   Mild diverticulosis was noted in the sigmoid colon 2.   The colon mucosa was otherwise normal  RECOMMENDATIONS: Given your age, you will not need another colonoscopy for colon cancer screening or polyp surveillance.  These types of tests usually stop around the age 51.   eSigned:  Louis Meckel, MD 05/14/2013 8:45 AM   cc: Corwin Levins, MD

## 2013-05-14 NOTE — Progress Notes (Signed)
No egg or soy allergy. emw 

## 2013-05-14 NOTE — Progress Notes (Signed)
Patient did not experience any of the following events: a burn prior to discharge; a fall within the facility; wrong site/side/patient/procedure/implant event; or a hospital transfer or hospital admission upon discharge from the facility. (G8907) Patient did not have preoperative order for IV antibiotic SSI prophylaxis. (G8918)  

## 2013-05-14 NOTE — Patient Instructions (Addendum)
Restart Coumadin today 05/14/13  YOU HAD AN ENDOSCOPIC PROCEDURE TODAY AT THE [facility]: Refer to the procedure report that was given to you for any specific questions about what was found during the examination.  If the procedure report does not answer your questions, please call your gastroenterologist to clarify.  If you requested that your care partner not be given the details of your procedure findings, then the procedure report has been included in a sealed envelope for you to review at your convenience later.  YOU SHOULD EXPECT: Some feelings of bloating in the abdomen. Passage of more gas than usual.  Walking can help get rid of the air that was put into your GI tract during the procedure and reduce the bloating. If you had a lower endoscopy (such as a colonoscopy or flexible sigmoidoscopy) you may notice spotting of blood in your stool or on the toilet paper. If you underwent a bowel prep for your procedure, then you may not have a normal bowel movement for a few days.  DIET: Your first meal following the procedure should be a light meal and then it is ok to progress to your normal diet.  A half-sandwich or bowl of soup is an example of a good first meal.  Heavy or fried foods are harder to digest and may make you feel nauseous or bloated.  Likewise meals heavy in dairy and vegetables can cause extra gas to form and this can also increase the bloating.  Drink plenty of fluids but you should avoid alcoholic beverages for 24 hours.  ACTIVITY: Your care partner should take you home directly after the procedure.  You should plan to take it easy, moving slowly for the rest of the day.  You can resume normal activity the day after the procedure however you should NOT DRIVE or use heavy machinery for 24 hours (because of the sedation medicines used during the test).    SYMPTOMS TO REPORT IMMEDIATELY: A gastroenterologist can be reached at any hour.  During normal business hours, 8:30 AM to  5:00 PM Monday through Friday, call [phone].  After hours and on weekends, please call the GI answering service at [phone] who will take a message and have the physician on call contact you.   Following lower endoscopy (colonoscopy or flexible sigmoidoscopy):  Excessive amounts of blood in the stool  Significant tenderness or worsening of abdominal pains  Swelling of the abdomen that is new, acute  Fever of 100F or higher  FOLLOW UP: If any biopsies were taken you will be contacted by phone or by letter within the next 1-3 weeks.  Call your gastroenterologist if you have not heard about the biopsies in 3 weeks.  Our staff will call the home number listed on your records the next business day following your procedure to check on you and address any questions or concerns that you may have at that time regarding the information given to you following your procedure. This is a courtesy call and so if there is no answer at the home number and we have not heard from you through the emergency physician on call, we will assume that you have returned to your regular daily activities without incident.  SIGNATURES/CONFIDENTIALITY: You and/or your care partner have signed paperwork which will be entered into your electronic medical record.  These signatures attest to the fact that that the information above on your After Visit Summary has been reviewed and is understood.  Full responsibility  of the confidentiality of this discharge information lies with you and/or your care-partner.  Diverticulosis, high fiber diet-handouts given

## 2013-05-15 ENCOUNTER — Telehealth: Payer: Self-pay | Admitting: *Deleted

## 2013-05-15 NOTE — Telephone Encounter (Signed)
  Follow up Call-  Call back number 05/14/2013  Post procedure Call Back phone  # [phone]  Permission to leave phone message Yes     Patient questions:  Do you have a fever, pain , or abdominal swelling? no Pain Score  0 *  Have you tolerated food without any problems? yes  Have you been able to return to your normal activities? yes  Do you have any questions about your discharge instructions: Diet   no Medications  no Follow up visit  no  Do you have questions or concerns about your Care? no  Actions: * If pain score is 4 or above: No action needed, pain <4.

## 2013-05-21 ENCOUNTER — Other Ambulatory Visit: Payer: Self-pay | Admitting: General Practice

## 2013-05-21 ENCOUNTER — Ambulatory Visit (INDEPENDENT_AMBULATORY_CARE_PROVIDER_SITE_OTHER): Payer: Medicare PPO | Admitting: General Practice

## 2013-05-21 DIAGNOSIS — G459 Transient cerebral ischemic attack, unspecified: Secondary | ICD-10-CM

## 2013-05-21 DIAGNOSIS — Z7901 Long term (current) use of anticoagulants: Secondary | ICD-10-CM

## 2013-05-21 DIAGNOSIS — Z8679 Personal history of other diseases of the circulatory system: Secondary | ICD-10-CM

## 2013-05-21 LAB — POCT INR: INR: 3.2

## 2013-05-21 MED ORDER — WARFARIN SODIUM 5 MG PO TABS: ORAL_TABLET | ORAL | Status: DC

## 2013-06-12 ENCOUNTER — Ambulatory Visit (INDEPENDENT_AMBULATORY_CARE_PROVIDER_SITE_OTHER): Payer: Medicare PPO | Admitting: Critical Care Medicine

## 2013-06-12 ENCOUNTER — Encounter: Payer: Self-pay | Admitting: Critical Care Medicine

## 2013-06-12 VITALS — BP 144/80 | HR 72 | Temp 97.8°F | Ht 63.0 in | Wt 152.8 lb

## 2013-06-12 DIAGNOSIS — J441 Chronic obstructive pulmonary disease with (acute) exacerbation: Secondary | ICD-10-CM

## 2013-06-12 MED ORDER — BUDESONIDE-FORMOTEROL FUMARATE 160-4.5 MCG/ACT IN AERO: 2.0000 | INHALATION_SPRAY | Freq: Two times a day (BID) | RESPIRATORY_TRACT | Status: DC

## 2013-06-12 MED ORDER — AZELASTINE HCL 0.1 % NA SOLN: 2.0000 | Freq: Every day | NASAL | Status: DC

## 2013-06-12 MED ORDER — CEFUROXIME AXETIL 500 MG PO TABS: 500.0000 mg | ORAL_TABLET | Freq: Two times a day (BID) | ORAL | Status: DC

## 2013-06-12 MED ORDER — PREDNISONE 10 MG PO TABS: ORAL_TABLET | ORAL | Status: DC

## 2013-06-12 NOTE — Patient Instructions (Signed)
Use astelin two puff each nostril daily, refill sent to pharmacy Stay on symbicort two puff twice daily Proventil 2 puff every 4-6 hours as needed  Prednisone 10mg  Take 4 tablets daily for 5 days then stop Ceftin 500mg  twice daily Return 3 months

## 2013-06-12 NOTE — Assessment & Plan Note (Signed)
Recurrent Copd exacerbation with flare d/t weather changes and lower airway inflammation. Plan Pulse prednisone x 5 day Ceftin 500 bid x 5 day Cont symbicort two puff bid Cont SABA Pt reinstructed to 80% level on inhaler technique

## 2013-06-12 NOTE — Progress Notes (Signed)
Subjective:    Patient ID: [name], female    DOB: 09-08-1941, 72 y.o.   MRN: [id number]  HPI  72 y.o.  WF Copd   06/12/2013 Chief Complaint  Patient presents with  . Acute Visit    increased SOB with activity x 1 month, chest tightness at times,sneezing, runny nose, PND, and spitting up clear, thick, stringy mucus.    Notes more dyspnea for two months.  Notes more chest tightness and cough with thick mucus.  Notes a need for a fan in face to breath.  No chest pain. No fever. Notes pndrip and sneezing a lot.     Review of Systems  Constitutional:   No  weight loss, night sweats,  Fevers, chills, fatigue, lassitude. HEENT:   No headaches,  Difficulty swallowing,  Tooth/dental problems,  Sore throat,                No sneezing, itching, ear ache, nasal congestion, post nasal drip,   CV:  No chest pain,  Orthopnea, PND, swelling in lower extremities, anasarca, dizziness, palpitations  GI  No heartburn, indigestion, abdominal pain, nausea, vomiting, diarrhea, change in bowel habits, loss of appetite  Resp: ++ shortness of breath with exertion not  at rest.  No excess mucus, no productive cough,  No non-productive cough,  No coughing up of blood.  No change in color of mucus.  No wheezing.  No chest wall deformity  Skin: no rash or lesions.  GU: no dysuria, change in color of urine, no urgency or frequency.  No flank pain.  MS:  No joint pain or swelling.  No decreased range of motion.  No back pain.  Psych:  No change in mood or affect. No depression or anxiety.  No memory loss.     Objective:   Physical Exam  Filed Vitals:   06/12/13 0859  BP: 144/80  Pulse: 72  Temp: 97.8 F (36.6 C)  TempSrc: Oral  Height: 5\' 3"  (1.6 m)  Weight: 152 lb 12.8 oz (69.31 kg)  SpO2: 98%    Gen: Pleasant, well-nourished, in no distress,  normal affect  ENT: No lesions,  mouth clear,  oropharynx clear, no postnasal drip  Neck: No JVD, no TMG, no carotid bruits  Lungs: No use of  accessory muscles, no dullness to percussion, expired wheezes, poor airflow  Cardiovascular: RRR, heart sounds normal, no murmur or gallops, no peripheral edema  Abdomen: soft and NT, no HSM,  BS normal  Musculoskeletal: No deformities, no cyanosis or clubbing  Neuro: alert, non focal  Skin: Warm, no lesions or rashes  No results found.     Assessment & Plan:   COPD exacerbation Recurrent Copd exacerbation with flare d/t weather changes and lower airway inflammation. Plan Pulse prednisone x 5 day Ceftin 500 bid x 5 day Cont symbicort two puff bid Cont SABA Pt reinstructed to 80% level on inhaler technique     Updated Medication List Outpatient Encounter Prescriptions as of 06/12/2013  Medication Sig Dispense Refill  . albuterol (PROAIR HFA) 108 (90 BASE) MCG/ACT inhaler Inhale into the lungs. 1-2 puffs every 4-6 hours as needed       . azelastine (ASTELIN) 137 MCG/SPRAY nasal spray Place 2 sprays into the nose daily. Use in each nostril as directed  90 mL  4  . benzonatate (TESSALON) 100 MG capsule Take 100 mg by mouth 3 (three) times daily as needed.      . budesonide-formoterol (SYMBICORT) 160-4.5 MCG/ACT inhaler  Inhale 2 puffs into the lungs 2 (two) times daily.  3 Inhaler  4  . Calcium Carbonate-Vitamin D (CALCIUM + D PO) Take 1 tablet by mouth 2 (two) times daily.       . cyclobenzaprine (FLEXERIL) 5 MG tablet Take 1 tablet (5 mg total) by mouth 3 (three) times daily as needed for muscle spasms.  60 tablet  1  . Estradiol 0.75 MG/1.25 GM (0.06%) topical gel Place 1.25 g onto the skin as directed.      . fluocinonide (LIDEX) 0.05 % cream Apply topically 2 (two) times daily as needed. Use as directed twice per day as needed  30 g  1  . irbesartan (AVAPRO) 150 MG tablet Take 1 tablet (150 mg total) by mouth daily.  90 tablet  3  . meloxicam (MOBIC) 15 MG tablet Take 1 tablet (15 mg total) by mouth daily as needed for pain.  90 tablet  3  . montelukast (SINGULAIR) 10 MG  tablet Take 1 tablet (10 mg total) by mouth daily.  90 tablet  3  . Multiple Vitamin (MULTIVITAMIN) tablet Take 1 tablet by mouth daily.        . mupirocin ointment (BACTROBAN) 2 % Apply 1 application topically 2 (two) times daily.       Marland Kitchen omeprazole (PRILOSEC) 20 MG capsule Take 1 capsule (20 mg total) by mouth daily.  90 capsule  3  . scopolamine (TRANSDERM-SCOP) 1.5 MG Place 1 patch onto the skin as needed.       . warfarin (COUMADIN) 5 MG tablet Take as directed by coumadin clinic  90 tablet  1  . [DISCONTINUED] azelastine (ASTELIN) 137 MCG/SPRAY nasal spray Place 1 spray into the nose daily. Use in each nostril as directed      . [DISCONTINUED] budesonide-formoterol (SYMBICORT) 160-4.5 MCG/ACT inhaler Inhale 2 puffs into the lungs 2 (two) times daily.  3 Inhaler  3  . amLODipine (NORVASC) 5 MG tablet Take 1 tablet (5 mg total) by mouth daily.  90 tablet  3  . cefUROXime (CEFTIN) 500 MG tablet Take 1 tablet (500 mg total) by mouth 2 (two) times daily.  10 tablet  0  . estradiol (CLIMARA) 0.05 mg/24hr Place 1 patch (0.05 mg total) onto the skin once a week.  12 patch  4  . predniSONE (DELTASONE) 10 MG tablet Take 4 tablets daily for 5 days then stop  20 tablet  0  . [DISCONTINUED] enoxaparin (LOVENOX) 100 MG/ML injection Inject 1 mL (100 mg total) into the skin daily.  12 Syringe  0  . [DISCONTINUED] simvastatin (ZOCOR) 20 MG tablet TAKE 1 TABLET BY MOUTH EVERY EVENING  30 tablet  10   No facility-administered encounter medications on file as of 06/12/2013.

## 2013-06-18 ENCOUNTER — Ambulatory Visit (INDEPENDENT_AMBULATORY_CARE_PROVIDER_SITE_OTHER): Payer: Medicare PPO | Admitting: General Practice

## 2013-06-18 DIAGNOSIS — Z7901 Long term (current) use of anticoagulants: Secondary | ICD-10-CM

## 2013-06-18 DIAGNOSIS — G459 Transient cerebral ischemic attack, unspecified: Secondary | ICD-10-CM

## 2013-06-18 DIAGNOSIS — Z8679 Personal history of other diseases of the circulatory system: Secondary | ICD-10-CM

## 2013-06-18 LAB — POCT INR: INR: 2.6

## 2013-07-16 ENCOUNTER — Ambulatory Visit (INDEPENDENT_AMBULATORY_CARE_PROVIDER_SITE_OTHER): Payer: Medicare PPO | Admitting: General Practice

## 2013-07-16 DIAGNOSIS — Z7901 Long term (current) use of anticoagulants: Secondary | ICD-10-CM

## 2013-07-16 DIAGNOSIS — G459 Transient cerebral ischemic attack, unspecified: Secondary | ICD-10-CM

## 2013-07-16 DIAGNOSIS — Z8679 Personal history of other diseases of the circulatory system: Secondary | ICD-10-CM

## 2013-07-16 LAB — POCT INR: INR: 2.5

## 2013-07-23 ENCOUNTER — Other Ambulatory Visit: Payer: Self-pay

## 2013-07-23 DIAGNOSIS — Z1231 Encounter for screening mammogram for malignant neoplasm of breast: Secondary | ICD-10-CM

## 2013-08-01 ENCOUNTER — Ambulatory Visit (INDEPENDENT_AMBULATORY_CARE_PROVIDER_SITE_OTHER): Payer: Medicare PPO | Admitting: Adult Health

## 2013-08-01 ENCOUNTER — Ambulatory Visit (INDEPENDENT_AMBULATORY_CARE_PROVIDER_SITE_OTHER)
Admission: RE | Admit: 2013-08-01 | Discharge: 2013-08-01 | Disposition: A | Discharge status: Home / Self Care | Payer: Medicare PPO | Source: Ambulatory Visit | Attending: Adult Health | Admitting: Adult Health

## 2013-08-01 ENCOUNTER — Encounter: Payer: Self-pay | Admitting: Adult Health

## 2013-08-01 VITALS — BP 112/82 | HR 89 | Temp 97.3°F | Ht 63.0 in | Wt 152.2 lb

## 2013-08-01 DIAGNOSIS — J441 Chronic obstructive pulmonary disease with (acute) exacerbation: Secondary | ICD-10-CM

## 2013-08-01 DIAGNOSIS — R911 Solitary pulmonary nodule: Secondary | ICD-10-CM | POA: Insufficient documentation

## 2013-08-01 MED ORDER — LEVALBUTEROL HCL 0.63 MG/3ML IN NEBU
0.6300 mg | INHALATION_SOLUTION | Freq: Once | RESPIRATORY_TRACT | Status: AC
  Administered 2013-08-01: 0.63 mg via RESPIRATORY_TRACT

## 2013-08-01 MED ORDER — PREDNISONE 10 MG PO TABS: ORAL_TABLET | ORAL | Status: DC

## 2013-08-01 NOTE — Assessment & Plan Note (Signed)
Flare  Check cxr today  xopenex neb in office   Plan  Prednisone taper. Over the next week. I will call with chest x-ray results. Follow up. Dr. Delford Field in 3 months and as needed. Please contact office for sooner follow up if symptoms do not improve or worsen or seek emergency care

## 2013-08-01 NOTE — Progress Notes (Signed)
  Subjective:    Patient ID: [name], female    DOB: 09/06/1941, 72 y.o.   MRN: [id number]  HPI 72 yo female with known of COPD   08/01/2013 Acute OV  Complains of increased SOB, wheezing, chest tightness, occ prod cough with clear mucus x3-4 weeks Worse yesterday. No hemoptysis, chest pain, sinus pressure, fever, orthopnea or edema.  Under going laser treatment for varicose veins.  Remains on coumadin with INR 2.5 (8/12) .  Wheezing worse yesterday.  Increased SABA use for last 1-2 days.  Mucus is mainly clear.    Review of Systems Constitutional:   No  weight loss, night sweats,  Fevers, chills, fatigue, or  lassitude.  HEENT:   No headaches,  Difficulty swallowing,  Tooth/dental problems, or  Sore throat,                No sneezing, itching, ear ache, nasal congestion, post nasal drip,   CV:  No chest pain,  Orthopnea, PND, swelling in lower extremities, anasarca, dizziness, palpitations, syncope.   GI  No heartburn, indigestion, abdominal pain, nausea, vomiting, diarrhea, change in bowel habits, loss of appetite, bloody stools.   Resp:    No chest wall deformity  Skin: no rash or lesions.  GU: no dysuria, change in color of urine, no urgency or frequency.  No flank pain, no hematuria   MS:  No joint pain or swelling.  No decreased range of motion.  No back pain.  Psych:  No change in mood or affect. No depression or anxiety.  No memory loss.         Objective:   Physical Exam GEN: A/Ox3; pleasant , NAD, well nourished   HEENT:  [place]/AT,  EACs-clear, TMs-wnl, NOSE-clear, THROAT-clear, no lesions, no postnasal drip or exudate noted.   NECK:  Supple w/ fair ROM; no JVD; normal carotid impulses w/o bruits; no thyromegaly or nodules palpated; no lymphadenopathy.  RESP  Few exp wheezes .no accessory muscle use, no dullness to percussion  CARD:  RRR, no m/r/g  , no peripheral edema, pulses intact, no cyanosis or clubbing., Ted hose   GI:   Soft & nt; nml bowel  sounds; no organomegaly or masses detected.  Musco: Warm bil, no deformities or joint swelling noted.   Neuro: alert, no focal deficits noted.    Skin: Warm, no lesions or rashes         Assessment & Plan:

## 2013-08-01 NOTE — Patient Instructions (Signed)
Prednisone taper. Over the next week. I will call with chest x-ray results. Follow up. Dr. Delford Field in 3 months and as needed. Please contact office for sooner follow up if symptoms do not improve or worsen or seek emergency care

## 2013-08-01 NOTE — Addendum Note (Signed)
Addended by: Boone Master E on: 08/01/2013 03:25 PM   Modules accepted: Orders

## 2013-08-02 ENCOUNTER — Other Ambulatory Visit: Payer: Self-pay | Admitting: Critical Care Medicine

## 2013-08-02 DIAGNOSIS — R911 Solitary pulmonary nodule: Secondary | ICD-10-CM

## 2013-08-02 NOTE — Progress Notes (Signed)
Quick Note:  Called spoke with patient, advised of cxr results / recs as stated by TP. Appt scheduled with PW in [facility] office 9.29.14 @ 0900 w/ cxr prior. Pt verbalized her understanding and denied any questions / concerns at this time. Orders only encounter created for cxr order; pt aware to arrive early. ______

## 2013-08-14 ENCOUNTER — Ambulatory Visit (INDEPENDENT_AMBULATORY_CARE_PROVIDER_SITE_OTHER): Payer: Medicare PPO | Admitting: General Practice

## 2013-08-14 DIAGNOSIS — Z7901 Long term (current) use of anticoagulants: Secondary | ICD-10-CM

## 2013-08-14 DIAGNOSIS — G459 Transient cerebral ischemic attack, unspecified: Secondary | ICD-10-CM

## 2013-08-14 DIAGNOSIS — Z8679 Personal history of other diseases of the circulatory system: Secondary | ICD-10-CM

## 2013-08-14 LAB — POCT INR: INR: 3.8

## 2013-08-21 ENCOUNTER — Encounter: Payer: Self-pay | Admitting: Internal Medicine

## 2013-08-28 ENCOUNTER — Ambulatory Visit
Admission: RE | Admit: 2013-08-28 | Discharge: 2013-08-28 | Disposition: A | Discharge status: Home / Self Care | Payer: Medicare Other | Source: Ambulatory Visit

## 2013-08-28 DIAGNOSIS — Z1231 Encounter for screening mammogram for malignant neoplasm of breast: Secondary | ICD-10-CM

## 2013-08-29 LAB — HM MAMMOGRAPHY

## 2013-09-02 ENCOUNTER — Ambulatory Visit (HOSPITAL_BASED_OUTPATIENT_CLINIC_OR_DEPARTMENT_OTHER)
Admission: RE | Admit: 2013-09-02 | Discharge: 2013-09-02 | Disposition: A | Discharge status: Home / Self Care | Payer: Medicare PPO | Source: Ambulatory Visit | Attending: Critical Care Medicine | Admitting: Critical Care Medicine

## 2013-09-02 ENCOUNTER — Encounter: Payer: Self-pay | Admitting: Critical Care Medicine

## 2013-09-02 ENCOUNTER — Ambulatory Visit (INDEPENDENT_AMBULATORY_CARE_PROVIDER_SITE_OTHER): Payer: Medicare PPO | Admitting: Critical Care Medicine

## 2013-09-02 VITALS — BP 140/70 | HR 70 | Temp 97.8°F | Ht 63.0 in | Wt 152.0 lb

## 2013-09-02 DIAGNOSIS — R1314 Dysphagia, pharyngoesophageal phase: Secondary | ICD-10-CM

## 2013-09-02 DIAGNOSIS — J4489 Other specified chronic obstructive pulmonary disease: Secondary | ICD-10-CM

## 2013-09-02 DIAGNOSIS — R911 Solitary pulmonary nodule: Secondary | ICD-10-CM

## 2013-09-02 DIAGNOSIS — J449 Chronic obstructive pulmonary disease, unspecified: Secondary | ICD-10-CM

## 2013-09-02 DIAGNOSIS — Z23 Encounter for immunization: Secondary | ICD-10-CM

## 2013-09-02 NOTE — Progress Notes (Signed)
Subjective:    Patient ID: [name], female    DOB: 07-23-1941, 72 y.o.   MRN: [id number]  HPI  72 yo female with known of COPD   08/01/13 Acute OV  Complains of increased SOB, wheezing, chest tightness, occ prod cough with clear mucus x3-4 weeks Worse yesterday. No hemoptysis, chest pain, sinus pressure, fever, orthopnea or edema.  Under going laser treatment for varicose veins.  Remains on coumadin with INR 2.5 (8/12) .  Wheezing worse yesterday.  Increased SABA use for last 1-2 days.  Mucus is mainly clear.   09/02/2013 Chief Complaint  Patient presents with  . 1 month follow up    with cxr.  Breathing is unchanged since last OV with TP.  Has difficulty breathing mostly with activities, using inhaler a couple times to work out, wheezing at times, and getting strangled a lot.    At last ov had pred taper .  Pt had a nodule seen on XRay. Pt has to use inhaler more frequently. Not able to do as many exercises. BAd all summer.  Ok at rest. No real cough, mucus is clear. Chokes a lot. Cannot swallow well.  Hx of esophageal stricture.       Review of Systems  Constitutional:   No  weight loss, night sweats,  Fevers, chills, fatigue, or  lassitude.  HEENT:   No headaches,  Difficulty swallowing,  Tooth/dental problems, or  Sore throat,                No sneezing, itching, ear ache, nasal congestion, post nasal drip,   CV:  No chest pain,  Orthopnea, PND, swelling in lower extremities, anasarca, dizziness, palpitations, syncope.   GI  No heartburn, indigestion, abdominal pain, nausea, vomiting, diarrhea, change in bowel habits, loss of appetite, bloody stools.   Resp:    No chest wall deformity  Skin: no rash or lesions.  GU: no dysuria, change in color of urine, no urgency or frequency.  No flank pain, no hematuria   MS:  No joint pain or swelling.  No decreased range of motion.  No back pain.  Psych:  No change in mood or affect. No depression or anxiety.  No memory  loss.         Objective:   Physical Exam BP 140/70  Pulse 70  Temp(Src) 97.8 F (36.6 C) (Oral)  Ht 5\' 3"  (1.6 m)  Wt 152 lb (68.947 kg)  BMI 26.93 kg/m2  SpO2 96%  GEN: A/Ox3; pleasant , NAD, well nourished   HEENT:  [place]/AT,  EACs-clear, TMs-wnl, NOSE-clear, THROAT-clear, no lesions, no postnasal drip or exudate noted.   NECK:  Supple w/ fair ROM; no JVD; normal carotid impulses w/o bruits; no thyromegaly or nodules palpated; no lymphadenopathy.  RESP  distant breath sounds.no accessory muscle use, no dullness to percussion  CARD:  RRR, no m/r/g  , no peripheral edema, pulses intact, no cyanosis or clubbing., Ted hose   GI:   Soft & nt; nml bowel sounds; no organomegaly or masses detected.  Musco: Warm bil, no deformities or joint swelling noted.   Neuro: alert, no focal deficits noted.    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 COPD stable at this time with recent exacerbation, now resolved Plan Maintain inhaled medications as prescribed Obtain CT scan of the chest to further evaluate right  lung nodule Flu vaccine was administered   Updated Medication List Outpatient Encounter Prescriptions as of 09/02/2013  Medication Sig Dispense Refill  . albuterol (PROAIR HFA) 108 (90 BASE) MCG/ACT inhaler Inhale into the lungs. 1-2 puffs every 4-6 hours as needed       . amLODipine (NORVASC) 5 MG tablet Take 1 tablet (5 mg total) by mouth daily.  90 tablet  3  . azelastine (ASTELIN) 137 MCG/SPRAY nasal spray Place 2 sprays into the nose daily. Use in each nostril as directed  90 mL  4  . benzonatate (TESSALON) 100 MG capsule Take 100 mg by mouth 3 (three) times daily as needed.      . budesonide-formoterol (SYMBICORT) 160-4.5 MCG/ACT inhaler Inhale 2 puffs into the lungs 2 (two) times daily.  3 Inhaler  4  . Calcium Carbonate-Vitamin D (CALCIUM + D PO) Take 1 tablet by mouth  2 (two) times daily.       . cyclobenzaprine (FLEXERIL) 5 MG tablet Take 1 tablet (5 mg total) by mouth 3 (three) times daily as needed for muscle spasms.  60 tablet  1  . Estradiol 0.75 MG/1.25 GM (0.06%) topical gel Place 1.25 g onto the skin as directed.      . fluocinonide (LIDEX) 0.05 % cream Apply topically 2 (two) times daily as needed. Use as directed twice per day as needed  30 g  1  . irbesartan (AVAPRO) 150 MG tablet Take 1 tablet (150 mg total) by mouth daily.  90 tablet  3  . meloxicam (MOBIC) 15 MG tablet Take 1 tablet (15 mg total) by mouth daily as needed for pain.  90 tablet  3  . montelukast (SINGULAIR) 10 MG tablet Take 1 tablet (10 mg total) by mouth daily.  90 tablet  3  . Multiple Vitamin (MULTIVITAMIN) tablet Take 1 tablet by mouth daily.        Marland Kitchen omeprazole (PRILOSEC) 20 MG capsule Take 1 capsule (20 mg total) by mouth daily.  90 capsule  3  . scopolamine (TRANSDERM-SCOP) 1.5 MG Place 1 patch onto the skin as needed.       . warfarin (COUMADIN) 5 MG tablet Take as directed by coumadin clinic  90 tablet  1  . estradiol (CLIMARA) 0.05 mg/24hr Place 1 patch (0.05 mg total) onto the skin once a week.  12 patch  4  . [DISCONTINUED] predniSONE (DELTASONE) 10 MG tablet 4 tabs for 2 days, then 3 tabs for 2 days, 2 tabs for 2 days, then 1 tab for 2 days, then stop  20 tablet  0   No facility-administered encounter medications on file as of 09/02/2013.

## 2013-09-02 NOTE — Assessment & Plan Note (Signed)
Stage B. COPD stable at this time with recent exacerbation, now resolved Plan Maintain inhaled medications as prescribed Obtain CT scan of the chest to further evaluate right lung nodule Flu vaccine was administered

## 2013-09-02 NOTE — Patient Instructions (Addendum)
Stay on symbicort A referral to Gastroenterology will be made A CT chest will be obtained today, I will call results to you Return 3 months

## 2013-09-04 NOTE — Progress Notes (Signed)
Quick Note:  Called, spoke with pt. Informed her of CT Chest results and recs per Dr. Delford Field. She verbalized understanding of both and voiced no further questions or concerns at this time. ______

## 2013-09-11 ENCOUNTER — Ambulatory Visit (INDEPENDENT_AMBULATORY_CARE_PROVIDER_SITE_OTHER): Payer: Medicare PPO | Admitting: General Practice

## 2013-09-11 DIAGNOSIS — Z7901 Long term (current) use of anticoagulants: Secondary | ICD-10-CM

## 2013-09-11 DIAGNOSIS — G459 Transient cerebral ischemic attack, unspecified: Secondary | ICD-10-CM

## 2013-09-11 DIAGNOSIS — Z8679 Personal history of other diseases of the circulatory system: Secondary | ICD-10-CM

## 2013-09-11 LAB — POCT INR: INR: 1.9

## 2013-09-12 ENCOUNTER — Encounter: Payer: Self-pay | Admitting: Gynecology

## 2013-09-12 ENCOUNTER — Ambulatory Visit (INDEPENDENT_AMBULATORY_CARE_PROVIDER_SITE_OTHER): Payer: Medicare PPO | Admitting: Gynecology

## 2013-09-12 VITALS — BP 124/76 | Ht 64.0 in | Wt 152.0 lb

## 2013-09-12 DIAGNOSIS — N8111 Cystocele, midline: Secondary | ICD-10-CM

## 2013-09-12 DIAGNOSIS — N952 Postmenopausal atrophic vaginitis: Secondary | ICD-10-CM

## 2013-09-12 DIAGNOSIS — Z79890 Hormone replacement therapy (postmenopausal): Secondary | ICD-10-CM

## 2013-09-12 DIAGNOSIS — N951 Menopausal and female climacteric states: Secondary | ICD-10-CM

## 2013-09-12 MED ORDER — ESTRADIOL 0.05 MG/24HR TD PTWK: 1.0000 | MEDICATED_PATCH | TRANSDERMAL | Status: DC

## 2013-09-12 NOTE — Progress Notes (Signed)
[name] 1941/03/16 [id number]        72 y.o.  [id number] for followup exam.  Former patient of Dr. Eda Paschal. Several issues as below.  Past medical history,surgical history, medications, allergies, family history and social history were all reviewed and documented in the EPIC chart.  ROS:  Performed and pertinent positives and negatives are included in the history, assessment and plan .  Exam: Kim assistant Filed Vitals:   09/12/13 1048  BP: 124/76  Height: 5\' 4"  (1.626 m)  Weight: 152 lb (68.947 kg)   General appearance  Normal Skin grossly normal Head/Neck normal with no cervical or supraclavicular adenopathy thyroid normal Lungs  clear Cardiac RR, without RMG Abdominal  soft, nontender, without masses, organomegaly or hernia Breasts  examined lying and sitting without masses, retractions, discharge or axillary adenopathy. Pelvic  Ext/BUS/vagina  With atrophic changes. First degree cystocele noted. Cuff well supported without significant rectocele   Adnexa  Without masses or tenderness    Anus and perineum  normal   Rectovaginal  normal sphincter tone without palpated masses or tenderness.    Assessment/Plan:  72 y.o. [id number] female for followup exam.   1. 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 Terrilee Files. Doing well without incontinence. Does have first-degree cystocele but benign otherwise well supported. Will continue to monitor. 2. HRT. Patient finishing samples of Estrogel and now going to start Climara 0.05 mg patches per Dr. Eda Paschal. Was having unacceptable half flushes.  I reviewed the whole issue of HRT with her to include the WHI study with increased risk of stroke, heart attack, DVT and breast cancer. The ACOG and NAMS statements for lowest dose for the shortest period of time reviewed. Transdermal versus oral first-pass effect benefit discussed.  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 Theoretically the Coumadin will negate thrombosis risk recognizing low with transdermal but no good studies to support this reviewed with her. Recommendation would be to try to wean this coming winter and if she tolerates being off of this and to stop it. If she would have unacceptable symptoms clearly understanding and accepting the risks she will reinitiate. I did refill her Climara 0.05 mg patches x1 year. 3. Pap smear 2010. No Pap smear done today. No history of abnormal Pap smears previously. Status post hysterectomy for benign indications and over the age of 72. We'll stop screening per current screening guidelines and she is comfortable with this. 4. Colonoscopy 2014. Repeat at their recommended interval. 5. Mammography 08/2013. Continue with annual mammography. SBE monthly reviewed. 6. DEXA 2013 through her primary physician's office. I do not have access to this report. She'll continue to followup with them in reference to her bone health. 7. Health maintenance. No blood work done as this is done through her primary physician's office. Followup one year, sooner as needed. 8.   Note: This document was prepared with digital dictation and possible smart phrase technology. Any transcriptional errors that result from this process are unintentional.   Dara Lords MD, 11:30 AM 09/12/2013

## 2013-09-12 NOTE — Patient Instructions (Signed)
Try to wean off of the estrogen this coming winter. Followup in one year, sooner as needed.

## 2013-09-13 LAB — URINALYSIS W MICROSCOPIC + REFLEX CULTURE
Bacteria, UA: NONE SEEN
Bilirubin Urine: NEGATIVE
Casts: NONE SEEN
Crystals: NONE SEEN
Glucose, UA: NEGATIVE mg/dL
Hgb urine dipstick: NEGATIVE
Ketones, ur: NEGATIVE mg/dL
Leukocytes, UA: NEGATIVE
Nitrite: NEGATIVE
Protein, ur: NEGATIVE mg/dL
Specific Gravity, Urine: 1.011 (ref 1.005–1.030)
Squamous Epithelial / HPF: NONE SEEN
Urobilinogen, UA: 0.2 mg/dL (ref 0.0–1.0)
pH: 6.5 (ref 5.0–8.0)

## 2013-09-17 ENCOUNTER — Telehealth: Payer: Self-pay | Admitting: *Deleted

## 2013-09-17 NOTE — Telephone Encounter (Signed)
Prior authorization for estradiol 0.05 mg patch completed on-line via [employer]. Rx has been approved.

## 2013-09-25 ENCOUNTER — Telehealth (HOSPITAL_COMMUNITY): Payer: Self-pay | Admitting: *Deleted

## 2013-09-25 NOTE — Telephone Encounter (Signed)
Call received from [place] at Dr. Florene Route office requesting information regarding Pulmonary Rehab status.   Left message for return call.  She will need PFT's within the last year for Medicare or Spirometry with post bronchodilator results.  Patient called to update status. Left message to return call. Cathie Olden RN

## 2013-09-30 ENCOUNTER — Encounter: Payer: Self-pay | Admitting: Gastroenterology

## 2013-09-30 ENCOUNTER — Telehealth: Payer: Self-pay | Admitting: Critical Care Medicine

## 2013-09-30 ENCOUNTER — Ambulatory Visit (INDEPENDENT_AMBULATORY_CARE_PROVIDER_SITE_OTHER): Payer: Medicare PPO | Admitting: Gastroenterology

## 2013-09-30 ENCOUNTER — Telehealth: Payer: Self-pay | Admitting: *Deleted

## 2013-09-30 ENCOUNTER — Ambulatory Visit: Payer: Medicare PPO | Admitting: Gastroenterology

## 2013-09-30 VITALS — BP 110/60 | HR 88 | Ht 62.6 in | Wt 154.1 lb

## 2013-09-30 DIAGNOSIS — R1310 Dysphagia, unspecified: Secondary | ICD-10-CM | POA: Insufficient documentation

## 2013-09-30 DIAGNOSIS — J449 Chronic obstructive pulmonary disease, unspecified: Secondary | ICD-10-CM

## 2013-09-30 NOTE — Assessment & Plan Note (Signed)
The patient is complaining of dysphagia to solids raising the question of a esophageal stricture.  A motility disorder is less likely although possible.  Recommendations #1 upper endoscopy with dilatation as indicated. \ The risk of holding anticoagulation therapy or antiplatelet medications was discussed including the increased risk for thromboembolic disease that may include DVT, pulmonary emboli and stroke. The patient understands this risk and is willing to proceed with temporally holding the medication provided that this is approved by her PCP or cardiologist.

## 2013-09-30 NOTE — Patient Instructions (Signed)
You will be contaced by our office prior to your procedure for directions on holding your Coumadin/Warfarin.  If you do not hear from our office 1 week prior to your scheduled procedure, please call [phone] to discuss.  You have been scheduled for an endoscopy with propofol. Please follow written instructions given to you at your visit today. If you use inhalers (even only as needed), please bring them with you on the day of your procedure. Your physician has requested that you go to www.startemmi.com and enter the access code given to you at your visit today. This web site gives a general overview about your procedure. However, you should still follow specific instructions given to you by our office regarding your preparation for the procedure.

## 2013-09-30 NOTE — Telephone Encounter (Signed)
Pt is requesting that pulm rehab referral be sent to [facility] instead of [facility]. I have placed a new order for [facility] to send to [place]. [email or web address] to advise the pt. Carron Curie, CMA

## 2013-09-30 NOTE — Telephone Encounter (Signed)
09/30/2013   RE: [name] DOB: 1941/09/17 MRN: [id number]   Dear Dr Jonny Ruiz  We have scheduled the above patient for an endoscopic procedure. Our records show that she is on anticoagulation therapy.   Please advise as to how long the patient may come off her therapy of Coumadin prior to the procedure, which is scheduled for 10/10/2013  Please fax back/ or route the completed form to Mabeline Varas at [phone]   Sincerely,    [name]

## 2013-09-30 NOTE — Progress Notes (Signed)
.  History of Present Illness:  The patient has returned for evaluation of dysphagia.  She's having dysphagia to solids and, occasionally to liquids.  She claims that she gets choked on her saliva.  She's regurgitates gastric contents if she bends over.  She rarely has pyrosis.  She remains on omeprazole and takes Coumadin.    Review of Systems: She has dyspnea on exertion Pertinent positive and negative review of systems were noted in the above HPI section. All other review of systems were otherwise negative.    Current Medications, Allergies, Past Medical History, Past Surgical History, Family History and Social History were reviewed in [employer] electronic medical record  Vital signs were reviewed in today's medical record. Physical Exam: General: Well developed , well nourished, no acute distress Skin: anicteric Head: Normocephalic and atraumatic Eyes:  sclerae anicteric, EOMI Ears: Normal auditory acuity Mouth: No deformity or lesions Lungs: Clear throughout to auscultation Heart: Regular rate and rhythm; no murmurs, rubs or bruits Abdomen: Soft, non tender and non distended. No masses, hepatosplenomegaly or hernias noted. Normal Bowel sounds Rectal:deferred Musculoskeletal: Symmetrical with no gross deformities  Pulses:  Normal pulses noted Extremities: No clubbing, cyanosis, edema or deformities noted Neurological: Alert oriented x 4, grossly nonfocal Psychological:  Alert and cooperative. Normal mood and affect

## 2013-09-30 NOTE — Telephone Encounter (Signed)
OK for Off Coumadin 5 Days prior; bridging not felt necessary  Will let coumadin clinic know as well

## 2013-10-01 ENCOUNTER — Telehealth: Payer: Self-pay | Admitting: General Practice

## 2013-10-01 NOTE — Telephone Encounter (Signed)
Instructions for coumadin pre and post procedure.  11/1 - Last dose of coumadin 11/6 - Procedure 11/7 - Take 7.5 mg 11/8 - Take 7.5 mg 11/9 - Resume current dosage Re-check on 11/12.  Patient verbalized understanding.

## 2013-10-01 NOTE — Telephone Encounter (Signed)
Pt advised. Jennifer Castillo, CMA  

## 2013-10-02 NOTE — Telephone Encounter (Signed)
Patient aware to hold coumadin 5 days before

## 2013-10-07 ENCOUNTER — Encounter: Payer: Self-pay | Admitting: Gastroenterology

## 2013-10-08 ENCOUNTER — Telehealth: Payer: Self-pay | Admitting: Critical Care Medicine

## 2013-10-08 NOTE — Telephone Encounter (Signed)
Pt is checking on status of referral to pulm rehab that was sent to [facility] possibly? Please advise PCC's thanks

## 2013-10-09 NOTE — Telephone Encounter (Signed)
Rehab refaxed to [facility]

## 2013-10-10 ENCOUNTER — Encounter: Payer: Self-pay | Admitting: Gastroenterology

## 2013-10-10 ENCOUNTER — Ambulatory Visit (AMBULATORY_SURGERY_CENTER): Payer: Medicare PPO | Admitting: Gastroenterology

## 2013-10-10 VITALS — BP 115/73 | HR 67 | Temp 97.2°F | Resp 19 | Ht 62.0 in | Wt 154.0 lb

## 2013-10-10 DIAGNOSIS — R1310 Dysphagia, unspecified: Secondary | ICD-10-CM

## 2013-10-10 DIAGNOSIS — K222 Esophageal obstruction: Secondary | ICD-10-CM

## 2013-10-10 MED ORDER — SODIUM CHLORIDE 0.9 % IV SOLN: 500.0000 mL | INTRAVENOUS | Status: DC

## 2013-10-10 NOTE — Op Note (Signed)
[facility] [address] [place], [address]   ENDOSCOPY PROCEDURE REPORT  PATIENT: Lauren, Ray  MR#: [id number] BIRTHDATE: [birth year] , 72  yrs. old GENDER: Female ENDOSCOPIST: Louis Meckel, MD REFERRED BY: PROCEDURE DATE:  10/10/2013 PROCEDURE:  EGD, diagnostic and Maloney dilation of esophagus ASA CLASS:     Class II INDICATIONS:  Dysphagia. MEDICATIONS: MAC sedation, administered by CRNA, Propofol (Diprivan) 130 mg IV, and Simethicone 0.6cc PO TOPICAL ANESTHETIC: Cetacaine Spray  DESCRIPTION OF PROCEDURE: After the risks benefits and alternatives of the procedure were thoroughly explained, informed consent was obtained.  The LB [id number] endoscope was introduced through the mouth and advanced to the second portion of the duodenum. Without limitations.  The instrument was slowly withdrawn as the mucosa was fully examined.      In early esophageal stricture was noted.  The 9.8 mm gastroscope easily traversed the stricture.   In early esophageal stricture was noted.  The 9.8 mm gastroscope easily traversed the stricture. There was a small sliding hiatal hernia.   There was a small sliding hiatal hernia.   The remainder of the upper endoscopy exam was otherwise normal.  Retroflexed views revealed no abnormalities. The scope was then withdrawn from the patient.  A #52 Jerene Dilling dilator was passed with moderate resistance.  There was no heme..  COMPLICATIONS: There were no complications.  ENDOSCOPIC IMPRESSION: esophageal stricture-status post [facility] dilation  RECOMMENDATIONS: 1.  Repeat dilation as needed 2.  resume Coumadin today REPEAT EXAM:  eSigned:  Louis Meckel, MD 10/10/2013 10:46 AM   FA:OZHYQ Ellin Mayhew, MD and Zelphia Cairo MD

## 2013-10-10 NOTE — Progress Notes (Signed)
Pt is aware she needs to call the coumadin clinic d/t Dr. Arlyce Dice saying she could restart her coumadin today.  No complaints noted in the recovery room. Maw

## 2013-10-10 NOTE — Progress Notes (Signed)
Called to room to assist during endoscopic procedure.  Patient ID and intended procedure confirmed with present staff. Received instructions for my participation in the procedure from the performing physician.  

## 2013-10-10 NOTE — Patient Instructions (Addendum)
Restart Coumadin today.  Check with [facility] and make them aware. You may resume your other medications today. Handout was given to your care partner on a dilatation diet to follow the rest of the day. Please call if any questions or concerns.   YOU HAD AN ENDOSCOPIC PROCEDURE TODAY AT THE [facility]: Refer to the procedure report that was given to you for any specific questions about what was found during the examination.  If the procedure report does not answer your questions, please call your gastroenterologist to clarify.  If you requested that your care partner not be given the details of your procedure findings, then the procedure report has been included in a sealed envelope for you to review at your convenience later.  YOU SHOULD EXPECT: Some feelings of bloating in the abdomen. Passage of more gas than usual.  Walking can help get rid of the air that was put into your GI tract during the procedure and reduce the bloating. If you had a lower endoscopy (such as a colonoscopy or flexible sigmoidoscopy) you may notice spotting of blood in your stool or on the toilet paper. If you underwent a bowel prep for your procedure, then you may not have a normal bowel movement for a few days.  DIET:  Drink plenty of fluids but you should avoid alcoholic beverages for 24 hours.  Please follow the dilatation diet the rest of the day.  ACTIVITY: Your care partner should take you home directly after the procedure.  You should plan to take it easy, moving slowly for the rest of the day.  You can resume normal activity the day after the procedure however you should NOT DRIVE or use heavy machinery for 24 hours (because of the sedation medicines used during the test).    SYMPTOMS TO REPORT IMMEDIATELY: A gastroenterologist can be reached at any hour.  During normal business hours, 8:30 AM to 5:00 PM Monday through Friday, call [phone].  After hours and on weekends, please call the  GI answering service at [phone] who will take a message and have the physician on call contact you.     Following upper endoscopy (EGD)  Vomiting of blood or coffee ground material  New chest pain or pain under the shoulder blades  Painful or persistently difficult swallowing  New shortness of breath  Fever of 100F or higher  Black, tarry-looking stools  FOLLOW UP: If any biopsies were taken you will be contacted by phone or by letter within the next 1-3 weeks.  Call your gastroenterologist if you have not heard about the biopsies in 3 weeks.  Our staff will call the home number listed on your records the next business day following your procedure to check on you and address any questions or concerns that you may have at that time regarding the information given to you following your procedure. This is a courtesy call and so if there is no answer at the home number and we have not heard from you through the emergency physician on call, we will assume that you have returned to your regular daily activities without incident.  SIGNATURES/CONFIDENTIALITY: You and/or your care partner have signed paperwork which will be entered into your electronic medical record.  These signatures attest to the fact that that the information above on your After Visit Summary has been reviewed and is understood.  Full responsibility of the confidentiality of this discharge information lies with you and/or your care-partner.

## 2013-10-10 NOTE — Progress Notes (Signed)
Report to pacu rn, vss, bbs=clear, cetacaine spray x1 and Mylacone x2 drops per Arlyce Dice.

## 2013-10-11 ENCOUNTER — Telehealth: Payer: Self-pay

## 2013-10-11 NOTE — Telephone Encounter (Signed)
  Follow up Call-  Call back number 10/10/2013 05/14/2013  Post procedure Call Back phone  # [phone]  Permission to leave phone message Yes Yes     Patient questions:  Do you have a fever, pain , or abdominal swelling? no Pain Score  0 *  Have you tolerated food without any problems? yes  Have you been able to return to your normal activities? yes  Do you have any questions about your discharge instructions: Diet   no Medications  no Follow up visit  no  Do you have questions or concerns about your Care? no  Actions: * If pain score is 4 or above: No action needed, pain <4.

## 2013-10-16 ENCOUNTER — Ambulatory Visit (INDEPENDENT_AMBULATORY_CARE_PROVIDER_SITE_OTHER): Payer: Medicare PPO | Admitting: General Practice

## 2013-10-16 DIAGNOSIS — G459 Transient cerebral ischemic attack, unspecified: Secondary | ICD-10-CM

## 2013-10-16 DIAGNOSIS — Z8679 Personal history of other diseases of the circulatory system: Secondary | ICD-10-CM

## 2013-10-16 DIAGNOSIS — Z7901 Long term (current) use of anticoagulants: Secondary | ICD-10-CM

## 2013-10-16 LAB — POCT INR: INR: 1.9

## 2013-10-16 NOTE — Progress Notes (Signed)
Pre-visit discussion using our clinic review tool. No additional management support is needed unless otherwise documented below in the visit note.  

## 2013-10-25 ENCOUNTER — Telehealth: Payer: Self-pay | Admitting: *Deleted

## 2013-10-25 DIAGNOSIS — J449 Chronic obstructive pulmonary disease, unspecified: Secondary | ICD-10-CM

## 2013-10-25 NOTE — Telephone Encounter (Signed)
Received records from Cardiopulmonary rehab from [facility].   PW has reviewed these records and recs pt will need o2 rx 2 lpm rest and 4 lpm with exertion.  Use [facility]. I have placed these results in scan folder at front.  Called, spoke with pt.  Informed her of above.  She verbalized understanding and would like to proceed with o2 set up.  She is aware I have placed order to have [facility] supply this and is aware [facility] will be calling her to set this up.  She is to call back if she doesn't hear from [facility] or if she has any further questions or concerns.

## 2013-10-26 ENCOUNTER — Encounter: Payer: Self-pay | Admitting: Critical Care Medicine

## 2013-10-29 ENCOUNTER — Ambulatory Visit (INDEPENDENT_AMBULATORY_CARE_PROVIDER_SITE_OTHER): Payer: Medicare PPO | Admitting: Critical Care Medicine

## 2013-10-29 ENCOUNTER — Encounter: Payer: Self-pay | Admitting: Critical Care Medicine

## 2013-10-29 VITALS — BP 148/76 | HR 78 | Temp 97.9°F | Ht 63.0 in | Wt 151.8 lb

## 2013-10-29 DIAGNOSIS — J4489 Other specified chronic obstructive pulmonary disease: Secondary | ICD-10-CM

## 2013-10-29 DIAGNOSIS — J441 Chronic obstructive pulmonary disease with (acute) exacerbation: Secondary | ICD-10-CM

## 2013-10-29 DIAGNOSIS — J449 Chronic obstructive pulmonary disease, unspecified: Secondary | ICD-10-CM

## 2013-10-29 DIAGNOSIS — R911 Solitary pulmonary nodule: Secondary | ICD-10-CM

## 2013-10-29 NOTE — Patient Instructions (Signed)
OK to be off oxygen daytime Use 2Liters at night An overnight sleep oxygen test will be obtained No change in medications, finish antibiotic Return 2 months

## 2013-10-29 NOTE — Progress Notes (Signed)
Subjective:    Patient ID: [name], female    DOB: November 24, 1941, 72 y.o.   MRN: [id number]  HPI  72 y.o. female with known of COPD  10/30/2013 Chief Complaint  Patient presents with  . Follow-up    o2 was ordered on Friday per results from Pulmonary Rehab at [facility].  Pt reports she had bronchitis when these results were taken.  Was seen on Wednesday at [facility].  Was given Kenalog inj, abx x 10 days, and prednisone.  Has finished pred.  Still has 4 days left on Keflex.  Breathing has improved - feels it is now back to baseline.  No wheezing, chest tightness/pain, or cough at this time.  Currently using o2 qhs.    This patient notes increasing shortness of breath and cough that began one month ago the patient is seen in urgent care and received a course of antibiotics and prednisone for which is now finished. While in pulmonary rehabilitation she has significant hypoxemia with exertion. Pulmonary rehabilitation contacted our office on this but did not relate the fact the patient has been ill the time. Oxygen was prescribed the patient home. She is requesting a truck, off some of the oxygen therapy as she is feeling better. There is less cough. There is less wheezing and shortness of breath. There is less chest discomfort at this time now   Review of Systems  Constitutional:   No  weight loss, night sweats,  Fevers, chills, fatigue, or  lassitude.  HEENT:   No headaches,  Difficulty swallowing,  Tooth/dental problems, or  Sore throat,                No sneezing, itching, ear ache, nasal congestion, post nasal drip,   CV:  No chest pain,  Orthopnea, PND, swelling in lower extremities, anasarca, dizziness, palpitations, syncope.   GI  No heartburn, indigestion, abdominal pain, nausea, vomiting, diarrhea, change in bowel habits, loss of appetite, bloody stools.   Resp:    No chest wall deformity  Skin: no rash or lesions.  GU: no dysuria, change in color of  urine, no urgency or frequency.  No flank pain, no hematuria   MS:  No joint pain or swelling.  No decreased range of motion.  No back pain.  Psych:  No change in mood or affect. No depression or anxiety.  No memory loss.         Objective:   Physical Exam BP 148/76  Pulse 78  Temp(Src) 97.9 F (36.6 C) (Oral)  Ht 5\' 3"  (1.6 m)  Wt 151 lb 12.8 oz (68.856 kg)  BMI 26.90 kg/m2  SpO2 97%  GEN: A/Ox3; pleasant , NAD, well nourished   HEENT:  [place]/AT,  EACs-clear, TMs-wnl, NOSE-clear, THROAT-clear, no lesions, no postnasal drip or exudate noted.   NECK:  Supple w/ fair ROM; no JVD; normal carotid impulses w/o bruits; no thyromegaly or nodules palpated; no lymphadenopathy.  RESP  distant breath sounds.no accessory muscle use, no dullness to percussion  CARD:  RRR, no m/r/g  , no peripheral edema, pulses intact, no cyanosis or clubbing., Ted hose   GI:   Soft & nt; nml bowel sounds; no organomegaly or masses detected.  Musco: Warm bil, no deformities or joint swelling noted.   Neuro: alert, no focal deficits noted.    Skin: Warm, no lesions or rashes         Assessment & Plan:   Obstructive chronic bronchitis without exacerbation  gold stage B Chronic obstructive lung disease with recent acute exacerbation gold stage B. Note recent hypoxemia during rehabilitation may have been related to exacerbation of lung disease now improving on corticosteroids and oral antibiotics Note oxygenation on room air at rest with exertion today in the office is now normal Plan The patient may come off oxygen with exertion and at rest except while doing pulmonary rehabilitation activities Overnight sleep oxygen test will be obtained Maintain inhaled medications as currently prescribed   Lung nodule 08/01/2013 Question new nodular density at right base versus summation artifact, approximately 9 mm diameter Followup CT scan of the chest October 2014 shows stability of prior right base nodule  thus no additional scanning indicated     Updated Medication List Outpatient Encounter Prescriptions as of 10/29/2013  Medication Sig  . albuterol (PROAIR HFA) 108 (90 BASE) MCG/ACT inhaler Inhale into the lungs. 1-2 puffs every 4-6 hours as needed   . amLODipine (NORVASC) 5 MG tablet Take 1 tablet (5 mg total) by mouth daily.  Marland Kitchen azelastine (ASTELIN) 137 MCG/SPRAY nasal spray Place 2 sprays into the nose daily. Use in each nostril as directed  . benzonatate (TESSALON) 100 MG capsule Take 100 mg by mouth 3 (three) times daily as needed.  . budesonide-formoterol (SYMBICORT) 160-4.5 MCG/ACT inhaler Inhale 2 puffs into the lungs 2 (two) times daily.  . Calcium Carbonate-Vitamin D (CALCIUM + D PO) Take 1 tablet by mouth 2 (two) times daily.   . cephALEXin (KEFLEX) 500 MG capsule Take 1 capsule by mouth 3 (three) times daily.  . cyclobenzaprine (FLEXERIL) 5 MG tablet Take 1 tablet (5 mg total) by mouth 3 (three) times daily as needed for muscle spasms.  Marland Kitchen estradiol (CLIMARA) 0.05 mg/24hr patch Place 1 patch (0.05 mg total) onto the skin once a week.  . fluocinonide (LIDEX) 0.05 % cream Apply topically 2 (two) times daily as needed. Use as directed twice per day as needed  . irbesartan (AVAPRO) 150 MG tablet Take 1 tablet (150 mg total) by mouth daily.  . meloxicam (MOBIC) 15 MG tablet Take 1 tablet (15 mg total) by mouth daily as needed for pain.  . montelukast (SINGULAIR) 10 MG tablet Take 1 tablet (10 mg total) by mouth daily.  . Multiple Vitamin (MULTIVITAMIN) tablet Take 1 tablet by mouth daily.    Marland Kitchen omeprazole (PRILOSEC) 20 MG capsule Take 1 capsule (20 mg total) by mouth daily.  Marland Kitchen scopolamine (TRANSDERM-SCOP) 1.5 MG Place 1 patch onto the skin as needed.   . warfarin (COUMADIN) 5 MG tablet Take as directed by coumadin clinic

## 2013-10-30 NOTE — Assessment & Plan Note (Signed)
08/01/2013 Question new nodular density at right base versus summation artifact, approximately 9 mm diameter Followup CT scan of the chest October 2014 shows stability of prior right base nodule thus no additional scanning indicated

## 2013-10-30 NOTE — Assessment & Plan Note (Signed)
Chronic obstructive lung disease with recent acute exacerbation gold stage B. Note recent hypoxemia during rehabilitation may have been related to exacerbation of lung disease now improving on corticosteroids and oral antibiotics Note oxygenation on room air at rest with exertion today in the office is now normal Plan The patient may come off oxygen with exertion and at rest except while doing pulmonary rehabilitation activities Overnight sleep oxygen test will be obtained Maintain inhaled medications as currently prescribed

## 2013-11-04 ENCOUNTER — Encounter: Payer: Self-pay | Admitting: Internal Medicine

## 2013-11-05 MED ORDER — MONTELUKAST SODIUM 10 MG PO TABS: 10.0000 mg | ORAL_TABLET | Freq: Every day | ORAL | Status: DC

## 2013-11-05 NOTE — Telephone Encounter (Signed)
Robin to inform pt

## 2013-11-08 ENCOUNTER — Encounter: Payer: Self-pay | Admitting: Critical Care Medicine

## 2013-11-08 MED ORDER — FLUCONAZOLE 150 MG PO TABS: 150.0000 mg | ORAL_TABLET | Freq: Every day | ORAL | Status: DC

## 2013-11-08 NOTE — Telephone Encounter (Signed)
Per 12.5.14 mychart message from pt: I would like to know if you could send in a prescription for a thrush in my mouth. I was on Antibiotics for 10 days. I finished taking them last Wednesday. My Tongue and mouth is sore. [facility] [place]. Thanks   Called spoke with patient who reported that she had gone to an UC on 11.22.14 and received Keflex x10 days.  Saw PW on 11.25.14 for follow up.  Pt reported that she finished the Keflex 2 days ago on 12.3.14.  Since that date she had developed a sore throat, her tongue is sore and is white and blotchy.  Pt is requesting rx for thrush.  [facility] [place] Allergies  Allergen Reactions  . Clarithromycin     REACTION: possible rash but could have been legionarres dz with rash  . Clarithromycin   . Crestor [Rosuvastatin Calcium]     fagitue and joint pain, "NO STATINS"   Spoke with CDY: ok to send in Diflucan 150mg  #3, 1 daily until gone. Rx sent to verified pharmacy ====================== When sending rx for the #3 tablets of Diflucan, received Contraindication b/t pt's Warfarin and Diflucan: Significance: Severe Warning: fluconazole may decrease hepatic metabolism and increase the hypoprothrombinemic effect of warfarin. Bleeding may occur.  Per CDY: instead of #3, okay to send in #1 Diflucan 150mg  Rx sent to pharmacy Pt is aware

## 2013-11-10 ENCOUNTER — Encounter: Payer: Self-pay | Admitting: Internal Medicine

## 2013-11-11 ENCOUNTER — Encounter: Payer: Self-pay | Admitting: Critical Care Medicine

## 2013-11-11 NOTE — Telephone Encounter (Signed)
Call [facility] in the AM

## 2013-11-12 NOTE — Telephone Encounter (Signed)
Robin to add CPX labs for next visit dec 26

## 2013-11-13 ENCOUNTER — Ambulatory Visit (INDEPENDENT_AMBULATORY_CARE_PROVIDER_SITE_OTHER): Payer: Medicare PPO | Admitting: General Practice

## 2013-11-13 DIAGNOSIS — Z8679 Personal history of other diseases of the circulatory system: Secondary | ICD-10-CM

## 2013-11-13 DIAGNOSIS — G459 Transient cerebral ischemic attack, unspecified: Secondary | ICD-10-CM

## 2013-11-13 DIAGNOSIS — Z7901 Long term (current) use of anticoagulants: Secondary | ICD-10-CM

## 2013-11-13 LAB — POCT INR: INR: 1.9

## 2013-11-13 NOTE — Progress Notes (Signed)
Pre-visit discussion using our clinic review tool. No additional management support is needed unless otherwise documented below in the visit note.  

## 2013-11-18 ENCOUNTER — Encounter: Payer: Self-pay | Admitting: Critical Care Medicine

## 2013-11-19 ENCOUNTER — Telehealth: Payer: Self-pay | Admitting: Critical Care Medicine

## 2013-11-19 MED ORDER — LEVOFLOXACIN 500 MG PO TABS: 500.0000 mg | ORAL_TABLET | Freq: Every day | ORAL | Status: DC

## 2013-11-19 NOTE — Telephone Encounter (Signed)
Pt is aware that we will be sending in an abx for her. Nothing further is needed.

## 2013-11-19 NOTE — Telephone Encounter (Signed)
i have not seen any ONO results   pls get them TODAY

## 2013-11-19 NOTE — Telephone Encounter (Signed)
Called [facility], spoke with Darl Pikes.  She will fax ONO results to [facility].  Will await results.

## 2013-11-19 NOTE — Telephone Encounter (Signed)
PW has received ONO results.   Per PW:  Pt spent 4 hours between 80-90% RA and < 85% multiple times.  She would benefit from o2 qhs only.  She doesn't need it during the day.  ------  Called, spoke with pt.  Informed her of above results and recs per Dr. Delford Field.  She verbalized understanding of this.  Pt states on Saturday she started with sore throat.  She now has chest congestion with long, stands of light yellow mucus and some wheezing.  Pt reports the mucus "tastes like it is an infection."  No increased SOB but reports she hasn't done much activity to tell.  No chest tightness, chest pain, or f/c/s.  Pt requesting further recs.  Dr. Delford Field, pls advise.  Thank you.

## 2013-11-19 NOTE — Telephone Encounter (Signed)
Results received and given to Dr. Delford Field to review.

## 2013-11-19 NOTE — Telephone Encounter (Signed)
We received this message through MyChart:  I have not heard any thing from the pulse oximetry test that I did  11-13-13.  I am hoping that I can  have the oxygen removed from my home before Christmas.  If you have the results could you please let me know.  Thanks  PW - can you please address pt's ONO. Thanks!  The pt is aware through MyChart that we will are waiting PW to review ONO results.

## 2013-11-19 NOTE — Telephone Encounter (Signed)
Call in Levaquin 500mg  per day for 7days

## 2013-11-21 ENCOUNTER — Telehealth: Payer: Self-pay | Admitting: Critical Care Medicine

## 2013-11-21 NOTE — Telephone Encounter (Signed)
I called and spoke with pt. She reports she was giving a steroid shot in her shoulder. It was sore and tender to touch. Today it is better. She has placed a call into the doc's office that gave her this. She just wanted to make sure she should still cont her ABX. I advised her yes. Nothing further needed

## 2013-11-25 ENCOUNTER — Encounter: Payer: Self-pay | Admitting: Internal Medicine

## 2013-11-25 ENCOUNTER — Encounter: Payer: Self-pay | Admitting: Critical Care Medicine

## 2013-11-25 MED ORDER — MOXIFLOXACIN HCL 400 MG PO TABS: 400.0000 mg | ORAL_TABLET | Freq: Every day | ORAL | Status: DC

## 2013-11-25 NOTE — Telephone Encounter (Signed)
I called and spoke with pt. She currently has shingles and has been in the house. She reports she has finished the levaquin we RX'D for her. She is still coughing up chunky green phlem, blowing out green mucus, nasal congestion, slight wheezing. Pt requesting further recs. Please advise Dr. Delford Field thanks  Allergies  Allergen Reactions  . Clarithromycin     REACTION: possible rash but could have been legionarres dz with rash  . Clarithromycin   . Crestor [Rosuvastatin Calcium]     fagitue and joint pain, "NO STATINS"

## 2013-11-25 NOTE — Telephone Encounter (Signed)
Per PW:  Call in avelox 400 mg qd x 5 days. [facility] and responded to pt's email with this information. I also called and spoke with pt.  She is aware of PW's recs and is aware [facility].  She is to call back if symptoms do not improve or worsen and is to let the Coumadin Clinic know she is on Avelox.  Pt verbalized understanding of instructions and is in agreement with this plan.

## 2013-11-26 ENCOUNTER — Encounter: Payer: Self-pay | Admitting: Internal Medicine

## 2013-11-26 MED ORDER — VALACYCLOVIR HCL 1 G PO TABS: 1000.0000 mg | ORAL_TABLET | Freq: Three times a day (TID) | ORAL | Status: DC

## 2013-11-29 ENCOUNTER — Ambulatory Visit (INDEPENDENT_AMBULATORY_CARE_PROVIDER_SITE_OTHER): Payer: Medicare PPO | Admitting: Internal Medicine

## 2013-11-29 ENCOUNTER — Encounter: Payer: Self-pay | Admitting: Internal Medicine

## 2013-11-29 ENCOUNTER — Other Ambulatory Visit (INDEPENDENT_AMBULATORY_CARE_PROVIDER_SITE_OTHER): Payer: Medicare PPO

## 2013-11-29 ENCOUNTER — Ambulatory Visit (INDEPENDENT_AMBULATORY_CARE_PROVIDER_SITE_OTHER): Payer: Medicare PPO | Admitting: General Practice

## 2013-11-29 VITALS — BP 130/62 | HR 101 | Temp 97.4°F | Ht 63.0 in | Wt 149.0 lb

## 2013-11-29 DIAGNOSIS — F32A Depression, unspecified: Secondary | ICD-10-CM

## 2013-11-29 DIAGNOSIS — Z7901 Long term (current) use of anticoagulants: Secondary | ICD-10-CM

## 2013-11-29 DIAGNOSIS — Z0000 Encounter for general adult medical examination without abnormal findings: Secondary | ICD-10-CM

## 2013-11-29 DIAGNOSIS — F329 Major depressive disorder, single episode, unspecified: Secondary | ICD-10-CM

## 2013-11-29 DIAGNOSIS — E785 Hyperlipidemia, unspecified: Secondary | ICD-10-CM

## 2013-11-29 DIAGNOSIS — Z23 Encounter for immunization: Secondary | ICD-10-CM

## 2013-11-29 HISTORY — DX: Major depressive disorder, single episode, unspecified: F32.9

## 2013-11-29 HISTORY — DX: Depression, unspecified: F32.A

## 2013-11-29 LAB — BASIC METABOLIC PANEL
BUN: 41 mg/dL — ABNORMAL HIGH (ref 6–23)
CO2: 24 mEq/L (ref 19–32)
Calcium: 9.5 mg/dL (ref 8.4–10.5)
Chloride: 108 mEq/L (ref 96–112)
Creatinine, Ser: 0.9 mg/dL (ref 0.4–1.2)
GFR: 65.84 mL/min (ref >60.00)
Glucose, Bld: 121 mg/dL — ABNORMAL HIGH (ref 70–99)
Potassium: 4.1 mEq/L (ref 3.5–5.1)
Sodium: 140 mEq/L (ref 135–145)

## 2013-11-29 LAB — HEPATIC FUNCTION PANEL
ALT: 23 U/L (ref 0–35)
AST: 21 U/L (ref 0–37)
Albumin: 4 g/dL (ref 3.5–5.2)
Alkaline Phosphatase: 59 U/L (ref 39–117)
Bilirubin, Direct: 0.1 mg/dL (ref 0.0–0.3)
Total Bilirubin: 0.5 mg/dL (ref 0.3–1.2)
Total Protein: 6.4 g/dL (ref 6.0–8.3)

## 2013-11-29 LAB — LIPID PANEL
Cholesterol: 268 mg/dL — ABNORMAL HIGH (ref 0–200)
HDL: 57.8 mg/dL (ref >39.00)
Total CHOL/HDL Ratio: 5
Triglycerides: 164 mg/dL — ABNORMAL HIGH (ref 0.0–149.0)
VLDL: 32.8 mg/dL (ref 0.0–40.0)

## 2013-11-29 LAB — LDL CHOLESTEROL, DIRECT: Direct LDL: 195.3 mg/dL

## 2013-11-29 LAB — POCT INR: INR: 2.3

## 2013-11-29 MED ORDER — CITALOPRAM HYDROBROMIDE 10 MG PO TABS: 10.0000 mg | ORAL_TABLET | Freq: Every day | ORAL | Status: DC

## 2013-11-29 NOTE — Assessment & Plan Note (Signed)
For citalopram 10 qd

## 2013-11-29 NOTE — Progress Notes (Signed)
Pre-visit discussion using our clinic review tool. No additional management support is needed unless otherwise documented below in the visit note.  

## 2013-11-29 NOTE — Assessment & Plan Note (Signed)

## 2013-11-29 NOTE — Progress Notes (Signed)
Subjective:    Patient ID: [name], female    DOB: 1941/07/08, 72 y.o.   MRN: [id number]  HPI Here for wellness and f/u;  Overall doing ok;  Pt denies CP, worsening SOB, DOE, wheezing, orthopnea, PND, worsening LE edema, palpitations, dizziness or syncope.  Pt denies neurological change such as new headache, facial or extremity weakness.  Pt denies polydipsia, polyuria, or low sugar symptoms. Pt states overall good compliance with treatment and medications, good tolerability, and has been trying to follow lower cholesterol diet.  Pt has had mild worsening depressive symptoms,but no suicidal ideation or panic. No fever, night sweats, wt loss, loss of appetite, or other constitutional symptoms.  Pt states good ability with ADL's, has low fall risk, home safety reviewed and adequate, no other significant changes in hearing or vision, and only occasionally active with exercise.  Had recent shingles outbreak right arm, and recent bronchitis.  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  Now on nocturnal oxygen, has pulm rehab scheduled at [facility]  Mentions HR does seem labile from 65 to 101 with little to no exertion, but no other symptoms.Not taking the simavastatin due to cramps, wants to avoid statin if possible. Past Medical History  Diagnosis Date  . SBE (subacute bacterial endocarditis) prophylaxis candidate   . COPD (chronic obstructive pulmonary disease)   . GERD (gastroesophageal reflux disease)   . Fibrocystic breast disease   . HLD (hyperlipidemia)   . Osteoarthritis   . IBS (irritable bowel syndrome)   . Hemorrhoids   . History of cerebrovascular accident   . TIA (transient ischemic attack)     anticoagulation therapy on coumadin  . Nephrolithiasis     hx  . HTN (hypertension)   . Stroke     STROKES  . DUB (dysfunctional uterine bleeding)   . Atrophic vaginitis   . Menopausal symptoms   . DI (detrusor instability)   . Basal cell carcinoma   . Status post  dilation of esophageal narrowing   . Pneumonia    Past Surgical History  Procedure Laterality Date  . Bladder suspension    . Excision of basal cell ca  2011  . Thumb surgery Right   . Rectocele repair  2008    A&P REPAIR -DR. MCDIARMID  . Anterior and posterior vaginal repair  2008    A&P REPAIR-DR MCDIARMID  . Vaginal hysterectomy  1983    NO BSO-DR. MABRY  . Colonoscopy    . Varicose vein surgery      reports that she quit smoking about 17 years ago. Her smoking use included Cigarettes. She has a 60 pack-year smoking history. She has never used smokeless tobacco. She reports that she drinks alcohol. She reports that she does not use illicit drugs. family history includes Breast cancer in her maternal aunt and mother; COPD in her mother; Emphysema in her mother; Uterine cancer in her mother. There is no history of Colon cancer. Allergies  Allergen Reactions  . Clarithromycin     REACTION: possible rash but could have been legionarres dz with rash  . Clarithromycin   . Crestor [Rosuvastatin Calcium]     fagitue and joint pain, "NO STATINS"   Current Outpatient Prescriptions on File Prior to Visit  Medication Sig Dispense Refill  . albuterol (PROAIR HFA) 108 (90 BASE) MCG/ACT inhaler Inhale into the lungs. 1-2 puffs every 4-6 hours as needed       . amLODipine (NORVASC) 5 MG tablet Take  1 tablet (5 mg total) by mouth daily.  90 tablet  3  . azelastine (ASTELIN) 137 MCG/SPRAY nasal spray Place 2 sprays into the nose daily. Use in each nostril as directed  90 mL  4  . benzonatate (TESSALON) 100 MG capsule Take 100 mg by mouth 3 (three) times daily as needed.      . budesonide-formoterol (SYMBICORT) 160-4.5 MCG/ACT inhaler Inhale 2 puffs into the lungs 2 (two) times daily.  3 Inhaler  4  . Calcium Carbonate-Vitamin D (CALCIUM + D PO) Take 1 tablet by mouth 2 (two) times daily.       . cyclobenzaprine (FLEXERIL) 5 MG tablet Take 1 tablet (5 mg total) by mouth 3 (three) times daily as  needed for muscle spasms.  60 tablet  1  . estradiol (CLIMARA) 0.05 mg/24hr patch Place 1 patch (0.05 mg total) onto the skin once a week.  12 patch  4  . fluconazole (DIFLUCAN) 150 MG tablet Take 1 tablet (150 mg total) by mouth daily.  1 tablet  0  . fluocinonide (LIDEX) 0.05 % cream Apply topically 2 (two) times daily as needed. Use as directed twice per day as needed  30 g  1  . irbesartan (AVAPRO) 150 MG tablet Take 1 tablet (150 mg total) by mouth daily.  90 tablet  3  . levofloxacin (LEVAQUIN) 500 MG tablet Take 1 tablet (500 mg total) by mouth daily.  7 tablet  0  . meloxicam (MOBIC) 15 MG tablet Take 1 tablet (15 mg total) by mouth daily as needed for pain.  90 tablet  3  . montelukast (SINGULAIR) 10 MG tablet Take 1 tablet (10 mg total) by mouth daily.  90 tablet  3  . moxifloxacin (AVELOX) 400 MG tablet Take 1 tablet (400 mg total) by mouth daily.  5 tablet  0  . Multiple Vitamin (MULTIVITAMIN) tablet Take 1 tablet by mouth daily.        Marland Kitchen omeprazole (PRILOSEC) 20 MG capsule Take 1 capsule (20 mg total) by mouth daily.  90 capsule  3  . scopolamine (TRANSDERM-SCOP) 1.5 MG Place 1 patch onto the skin as needed.       . valACYclovir (VALTREX) 1000 MG tablet Take 1 tablet (1,000 mg total) by mouth 3 (three) times daily.  21 tablet  0  . warfarin (COUMADIN) 5 MG tablet Take as directed by coumadin clinic  90 tablet  1  . [DISCONTINUED] valsartan (DIOVAN) 160 MG tablet Take 1 tablet (160 mg total) by mouth daily.  90 tablet  3   No current facility-administered medications on file prior to visit.   Review of Systems Constitutional: Negative for diaphoresis, activity change, appetite change or unexpected weight change.  HENT: Negative for hearing loss, ear pain, facial swelling, mouth sores and neck stiffness.   Eyes: Negative for pain, redness and visual disturbance.  Respiratory: Negative for shortness of breath and wheezing.   Cardiovascular: Negative for chest pain and palpitations.    Gastrointestinal: Negative for diarrhea, blood in stool, abdominal distention or other pain Genitourinary: Negative for hematuria, flank pain or change in urine volume.  Musculoskeletal: Negative for myalgias and joint swelling.  Skin: Negative for color change and wound.  Neurological: Negative for syncope and numbness. other than noted Hematological: Negative for adenopathy.  Psychiatric/Behavioral: Negative for hallucinations, self-injury, decreased concentration and agitation.      Objective:   Physical Exam BP 130/62  Pulse 101  Temp(Src) 97.4 F (36.3 C) (Oral)  Ht 5\' 3"  (1.6 m)  Wt 149 lb (67.586 kg)  BMI 26.40 kg/m2  SpO2 97% VS noted,  Constitutional: Pt is oriented to person, place, and time. Appears well-developed and well-nourished.  Head: Normocephalic and atraumatic.  Right Ear: External ear normal.  Left Ear: External ear normal.  Nose: Nose normal.  Mouth/Throat: Oropharynx is clear and moist.  Eyes: Conjunctivae and EOM are normal. Pupils are equal, round, and reactive to light.  Neck: Normal range of motion. Neck supple. No JVD present. No tracheal deviation present.  Cardiovascular: Normal rate, regular rhythm, normal heart sounds and intact distal pulses.   Pulmonary/Chest: Effort normal and breath sounds normal.  Abdominal: Soft. Bowel sounds are normal. There is no tenderness. No HSM  Musculoskeletal: Normal range of motion. Exhibits no edema.  Lymphadenopathy:  Has no cervical adenopathy.  Neurological: Pt is alert and oriented to person, place, and time. Pt has normal reflexes. No cranial nerve deficit.  Skin: Skin is warm and dry. Crusting rash to right arm noted Psychiatric:  Has 2+ nervous and depressed affect, Behavior is normal.      Assessment & Plan:

## 2013-11-29 NOTE — Patient Instructions (Addendum)
You had the new Prevnar pneumonia shot today Please take all new medication as prescribed - the citalopram 10 mg per day Please continue all other medications as before, and refills have been done if requested. Please have the pharmacy call with any other refills you may need. Please continue your efforts at being more active, low cholesterol diet, and weight control. You are otherwise up to date with prevention measures today. Please keep your appointments with your specialists as you have planned  Please go to the LAB in the Basement (turn left off the elevator) for the tests to be done today You will be contacted by phone if any changes need to be made immediately.  Otherwise, you will receive a letter about your results with an explanation, but please check with MyChart first.  Please return in 6 months, or sooner if needed

## 2013-11-29 NOTE — Addendum Note (Signed)
Addended by: Scharlene Gloss B on: 11/29/2013 02:01 PM   Modules accepted: Orders

## 2013-12-01 ENCOUNTER — Encounter: Payer: Self-pay | Admitting: Critical Care Medicine

## 2013-12-02 ENCOUNTER — Other Ambulatory Visit: Payer: Self-pay | Admitting: Internal Medicine

## 2013-12-02 MED ORDER — CLOTRIMAZOLE 10 MG MT TROC: 10.0000 mg | Freq: Every day | OROMUCOSAL | Status: DC

## 2013-12-02 NOTE — Telephone Encounter (Signed)
Pt states that she has taken 2 rounds of abx and now feels like she has thrush in her mouth. She c/o having sore in her mouth and it is painful to eat. Please advise. Carron Curie, CMA Allergies  Allergen Reactions  . Clarithromycin     REACTION: possible rash but could have been legionarres dz with rash  . Clarithromycin   . Lipitor [Atorvastatin]   . Simvastatin   . Crestor [Rosuvastatin Calcium]     fagitue and joint pain, "NO STATINS"

## 2013-12-03 NOTE — Telephone Encounter (Signed)
Ok to let pt know, that repeat valtrex is not indicated for a shingles outbreak, only one episode of tx is needed, then simply takes more time to heal completely

## 2013-12-03 NOTE — Telephone Encounter (Signed)
Patient informed. 

## 2013-12-09 DIAGNOSIS — J449 Chronic obstructive pulmonary disease, unspecified: Secondary | ICD-10-CM | POA: Diagnosis not present

## 2013-12-09 DIAGNOSIS — Z5189 Encounter for other specified aftercare: Secondary | ICD-10-CM | POA: Diagnosis not present

## 2013-12-10 ENCOUNTER — Encounter: Payer: Self-pay | Admitting: Internal Medicine

## 2013-12-10 MED ORDER — GABAPENTIN 300 MG PO CAPS: 300.0000 mg | ORAL_CAPSULE | Freq: Three times a day (TID) | ORAL | Status: DC

## 2013-12-10 NOTE — Telephone Encounter (Signed)
Robin/staff to see note above, let pt know

## 2013-12-16 ENCOUNTER — Encounter: Payer: Self-pay | Admitting: Internal Medicine

## 2013-12-16 DIAGNOSIS — Z5189 Encounter for other specified aftercare: Secondary | ICD-10-CM | POA: Diagnosis not present

## 2013-12-16 DIAGNOSIS — J449 Chronic obstructive pulmonary disease, unspecified: Secondary | ICD-10-CM | POA: Diagnosis not present

## 2013-12-17 ENCOUNTER — Other Ambulatory Visit: Payer: Self-pay | Admitting: *Deleted

## 2013-12-17 MED ORDER — AMLODIPINE BESYLATE 5 MG PO TABS: 5.0000 mg | ORAL_TABLET | Freq: Every day | ORAL | Status: DC

## 2013-12-17 MED ORDER — CITALOPRAM HYDROBROMIDE 10 MG PO TABS: 10.0000 mg | ORAL_TABLET | Freq: Every day | ORAL | Status: DC

## 2013-12-17 MED ORDER — OMEPRAZOLE 20 MG PO CPDR: 20.0000 mg | DELAYED_RELEASE_CAPSULE | Freq: Every day | ORAL | Status: DC

## 2013-12-17 MED ORDER — MELOXICAM 15 MG PO TABS: 15.0000 mg | ORAL_TABLET | Freq: Every day | ORAL | Status: DC | PRN

## 2013-12-17 NOTE — Telephone Encounter (Signed)
Sent email needing meloxicam, omeprazole, citalopram and norvasc sent to med by mail...Johny Chess

## 2013-12-17 NOTE — Telephone Encounter (Signed)
Medications has been fax to Med-by-mail...[name]

## 2013-12-19 ENCOUNTER — Encounter: Payer: Self-pay | Admitting: Internal Medicine

## 2013-12-19 ENCOUNTER — Ambulatory Visit (INDEPENDENT_AMBULATORY_CARE_PROVIDER_SITE_OTHER)
Admission: RE | Admit: 2013-12-19 | Discharge: 2013-12-19 | Disposition: A | Discharge status: Home / Self Care | Payer: Medicare Other | Source: Ambulatory Visit | Attending: Internal Medicine | Admitting: Internal Medicine

## 2013-12-19 ENCOUNTER — Other Ambulatory Visit (INDEPENDENT_AMBULATORY_CARE_PROVIDER_SITE_OTHER): Payer: Medicare Other

## 2013-12-19 ENCOUNTER — Ambulatory Visit (INDEPENDENT_AMBULATORY_CARE_PROVIDER_SITE_OTHER): Payer: Medicare Other | Admitting: Internal Medicine

## 2013-12-19 VITALS — BP 112/72 | HR 91 | Temp 98.1°F | Ht 63.0 in | Wt 154.0 lb

## 2013-12-19 DIAGNOSIS — M25539 Pain in unspecified wrist: Secondary | ICD-10-CM

## 2013-12-19 DIAGNOSIS — R1319 Other dysphagia: Secondary | ICD-10-CM

## 2013-12-19 DIAGNOSIS — M25531 Pain in right wrist: Secondary | ICD-10-CM

## 2013-12-19 DIAGNOSIS — R222 Localized swelling, mass and lump, trunk: Secondary | ICD-10-CM | POA: Diagnosis not present

## 2013-12-19 DIAGNOSIS — J449 Chronic obstructive pulmonary disease, unspecified: Secondary | ICD-10-CM

## 2013-12-19 DIAGNOSIS — J438 Other emphysema: Secondary | ICD-10-CM | POA: Diagnosis not present

## 2013-12-19 LAB — HEPATIC FUNCTION PANEL
ALT: 24 U/L (ref 0–35)
AST: 20 U/L (ref 0–37)
Albumin: 3.8 g/dL (ref 3.5–5.2)
Alkaline Phosphatase: 66 U/L (ref 39–117)
Bilirubin, Direct: 0.1 mg/dL (ref 0.0–0.3)
Total Bilirubin: 0.8 mg/dL (ref 0.3–1.2)
Total Protein: 6.8 g/dL (ref 6.0–8.3)

## 2013-12-19 LAB — CBC WITH DIFFERENTIAL/PLATELET
Basophils Absolute: 0.1 10*3/uL (ref 0.0–0.1)
Basophils Relative: 0.7 % (ref 0.0–3.0)
Eosinophils Absolute: 0.1 10*3/uL (ref 0.0–0.7)
Eosinophils Relative: 1 % (ref 0.0–5.0)
HCT: 39.8 % (ref 36.0–46.0)
Hemoglobin: 13.7 g/dL (ref 12.0–15.0)
Lymphocytes Relative: 14.1 % (ref 12.0–46.0)
Lymphs Abs: 1.2 10*3/uL (ref 0.7–4.0)
MCHC: 34.4 g/dL (ref 30.0–36.0)
MCV: 84.6 fl (ref 78.0–100.0)
Monocytes Absolute: 0.9 10*3/uL (ref 0.1–1.0)
Monocytes Relative: 11.4 % (ref 3.0–12.0)
Neutro Abs: 6.1 10*3/uL (ref 1.4–7.7)
Neutrophils Relative %: 72.8 % (ref 43.0–77.0)
Platelets: 200 10*3/uL (ref 150.0–400.0)
RBC: 4.71 Mil/uL (ref 3.87–5.11)
RDW: 15.1 % — ABNORMAL HIGH (ref 11.5–14.6)
WBC: 8.3 10*3/uL (ref 4.5–10.5)

## 2013-12-19 LAB — BASIC METABOLIC PANEL
BUN: 26 mg/dL — ABNORMAL HIGH (ref 6–23)
CO2: 29 mEq/L (ref 19–32)
Calcium: 9.5 mg/dL (ref 8.4–10.5)
Chloride: 101 mEq/L (ref 96–112)
Creatinine, Ser: 0.9 mg/dL (ref 0.4–1.2)
GFR: 65.83 mL/min (ref >60.00)
Glucose, Bld: 91 mg/dL (ref 70–99)
Potassium: 4.8 mEq/L (ref 3.5–5.1)
Sodium: 138 mEq/L (ref 135–145)

## 2013-12-19 NOTE — Progress Notes (Signed)
Subjective:    Patient ID: [name], female    DOB: 06/01/1941, 73 y.o.   MRN: [id number]  HPI here to f/u with acute; has new onset visible swelling to manubrium area/low mid neck, no change since onset, no pain, not hard  But ? Soft swelling, nothing seemed to bring it on, nothing seems to help, seems assoc with sensation to swallowing, more with fluids, possiblly some with solids.  No n/v, no pain with swallowing, no wt loss (actually some wt gain in last few wks with less activity, less going to the gym with recent pulm issues).  Also mentions some ? Swelling sensation to lower mid cervical spine, as well as pain to right neck, radiating to right upper chest, right shoulder and RUE, mostly to distribution of recent shingles.  On top of all this, now with visible right shoulder swelling, same shoulder as had cortisone shot mid December per ortho (dr yates physician assistant) just prior to onset of the shingles.  Right neck, RUE pain improved with gabapentin 300 tid helps.  Also incidentally menitons pain to the first dorsal compartment of the right thumb, seemed about the same time as onset shingles.  Also taking citalopram x 2 wks now with some mood improvement evident, still significant anxiety and frustrated with pain, ongoing health issues , and cant get back to the gym and being more active.  Doesn't lie in bed but feels stuck sitting at home.  No SI or HI.  Last chest ct /cxr sept 2014. Appetite "too good", has some night sweat recently as well. Recent hx shows tx with 2 antibx per Dr Joya Gaskins late dec. As well for pulm issues, still some greenish sputum with which she is very concerned, but no incr cough, sob, wheezing or fever.  Husband with current bronchitis symptoms Past Medical History  Diagnosis Date  . SBE (subacute bacterial endocarditis) prophylaxis candidate   . COPD (chronic obstructive pulmonary disease)   . GERD (gastroesophageal reflux disease)   . Fibrocystic breast disease    . HLD (hyperlipidemia)   . Osteoarthritis   . IBS (irritable bowel syndrome)   . Hemorrhoids   . History of cerebrovascular accident   . TIA (transient ischemic attack)     anticoagulation therapy on coumadin  . Nephrolithiasis     hx  . HTN (hypertension)   . Stroke     STROKES  . DUB (dysfunctional uterine bleeding)   . Atrophic vaginitis   . Menopausal symptoms   . DI (detrusor instability)   . Basal cell carcinoma   . Status post dilation of esophageal narrowing   . Pneumonia   . Depression 11/29/2013   Past Surgical History  Procedure Laterality Date  . Bladder suspension    . Excision of basal cell ca  2011  . Thumb surgery Right   . Rectocele repair  2008    A&P REPAIR -DR. MCDIARMID  . Anterior and posterior vaginal repair  2008    A&P REPAIR-DR MCDIARMID  . Vaginal hysterectomy  1983    NO BSO-DR. MABRY  . Colonoscopy    . Varicose vein surgery      reports that she quit smoking about 18 years ago. Her smoking use included Cigarettes. She has a 60 pack-year smoking history. She has never used smokeless tobacco. She reports that she drinks alcohol. She reports that she does not use illicit drugs. family history includes Breast cancer in her maternal aunt and mother; COPD in her  mother; Emphysema in her mother; Uterine cancer in her mother. There is no history of Colon cancer. Allergies  Allergen Reactions  . Clarithromycin     REACTION: possible rash but could have been legionarres dz with rash  . Clarithromycin   . Lipitor [Atorvastatin]   . Simvastatin   . Crestor [Rosuvastatin Calcium]     fagitue and joint pain, "NO STATINS"   Current Outpatient Prescriptions on File Prior to Visit  Medication Sig Dispense Refill  . albuterol (PROAIR HFA) 108 (90 BASE) MCG/ACT inhaler Inhale into the lungs. 1-2 puffs every 4-6 hours as needed       . amLODipine (NORVASC) 5 MG tablet Take 1 tablet (5 mg total) by mouth daily.  90 tablet  3  . benzonatate (TESSALON) 100  MG capsule Take 100 mg by mouth 3 (three) times daily as needed.      . budesonide-formoterol (SYMBICORT) 160-4.5 MCG/ACT inhaler Inhale 2 puffs into the lungs 2 (two) times daily.  3 Inhaler  4  . Calcium Carbonate-Vitamin D (CALCIUM + D PO) Take 1 tablet by mouth 2 (two) times daily.       . citalopram (CELEXA) 10 MG tablet Take 1 tablet (10 mg total) by mouth daily.  30 tablet  3  . clotrimazole (MYCELEX) 10 MG troche Take 1 tablet (10 mg total) by mouth 5 (five) times daily.  12 tablet  3  . cyclobenzaprine (FLEXERIL) 5 MG tablet Take 1 tablet (5 mg total) by mouth 3 (three) times daily as needed for muscle spasms.  60 tablet  1  . estradiol (CLIMARA) 0.05 mg/24hr patch Place 1 patch (0.05 mg total) onto the skin once a week.  12 patch  4  . fluocinonide (LIDEX) 0.05 % cream Apply topically 2 (two) times daily as needed. Use as directed twice per day as needed  30 g  1  . gabapentin (NEURONTIN) 300 MG capsule Take 1 capsule (300 mg total) by mouth 3 (three) times daily.  90 capsule  5  . irbesartan (AVAPRO) 150 MG tablet Take 1 tablet (150 mg total) by mouth daily.  90 tablet  3  . meloxicam (MOBIC) 15 MG tablet Take 1 tablet (15 mg total) by mouth daily as needed for pain.  90 tablet  3  . montelukast (SINGULAIR) 10 MG tablet Take 1 tablet (10 mg total) by mouth daily.  90 tablet  3  . Multiple Vitamin (MULTIVITAMIN) tablet Take 1 tablet by mouth daily.        Marland Kitchen omeprazole (PRILOSEC) 20 MG capsule Take 1 capsule (20 mg total) by mouth daily.  90 capsule  3  . warfarin (COUMADIN) 5 MG tablet Take as directed by coumadin clinic  90 tablet  1  . azelastine (ASTELIN) 137 MCG/SPRAY nasal spray Place 2 sprays into the nose daily. Use in each nostril as directed  90 mL  4  . fluconazole (DIFLUCAN) 150 MG tablet Take 1 tablet (150 mg total) by mouth daily.  1 tablet  0  . scopolamine (TRANSDERM-SCOP) 1.5 MG Place 1 patch onto the skin as needed.       . [DISCONTINUED] valsartan (DIOVAN) 160 MG tablet  Take 1 tablet (160 mg total) by mouth daily.  90 tablet  3   No current facility-administered medications on file prior to visit.   Review of Systems  Constitutional: Negative for unexpected weight change, or unusual diaphoresis  HENT: Negative for tinnitus.   Eyes: Negative for photophobia and visual disturbance.  Respiratory: Negative for choking and stridor.   Gastrointestinal: Negative for vomiting and blood in stool.  Genitourinary: Negative for hematuria and decreased urine volume.  Musculoskeletal: Negative for acute joint swelling Skin: Negative for color change and wound.  Neurological: Negative for tremors and numbness other than noted        Objective:   Physical Exam BP 112/72  Pulse 91  Temp(Src) 98.1 F (36.7 C) (Oral)  Ht 5\' 3"  (1.6 m)  Wt 154 lb (69.854 kg)  BMI 27.29 kg/m2  SpO2 96% VS noted, not "ill" appearing Constitutional: Pt appears well-developed and well-nourished.  HENT: Head: NCAT.  Right Ear: External ear normal.  Left Ear: External ear normal.  Eyes: Conjunctivae and EOM are normal. Pupils are equal, round, and reactive to light.  Neck: Normal range of motion. Neck supple.  Cardiovascular: Normal rate and regular rhythm.   Pulmonary/Chest: Effort normal and breath sounds decr - No rales or wheezing.  Abd:  Soft, NT, non-distended, + BS Neurological: Pt is alert. Not confused , motor 5/5 Skin: Skin is warm. No erythema.  Psychiatric: Pt behavior is normal. Thought content normal. 2+ nervous, frustrated, some tearful at one point Cervical spine with mild nondiscrete swelling area right low paracervical apprx 4 x 2 cm area, tender/dysesthetic per pt Right shoulder with rather significnat 2+ diffuse swelling - I suspect bursitis Also with dysethetic area to right trapezoid, right shoudler, right upper chest and most of RUE as well in distribution or recent shinlges rash now resolved RUE also with marked warm/tender area first dorsal compartment with  some minor sweling as well at the wrist level  Also noted - nondiscrete moderate NT swelling area to manubrium at least 3 cm (her main complaint today)    Assessment & Plan:

## 2013-12-19 NOTE — Progress Notes (Signed)
Pre-visit discussion using our clinic review tool. No additional management support is needed unless otherwise documented below in the visit note.  

## 2013-12-19 NOTE — Patient Instructions (Addendum)
Please continue all other medications as before, and refills have been done if requested, including the gabapentin and citalopram. Please have the pharmacy call with any other refills you may need.  Please go to the LAB in the Basement (turn left off the elevator) for the tests to be done today You will be contacted by phone if any changes need to be made immediately.  Otherwise, you will receive a letter about your results with an explanation, but please check with MyChart first.  You will be contacted regarding the referral for: CT chest (no contrast) for today - to see [facility] now, as well as referral to Dr Burney Gauze for the Right wrist for later -   Please keep your appointments with your specialists as you have planned  - Dr Joya Gaskins, but no further lung treatment needs done at this time

## 2013-12-19 NOTE — Assessment & Plan Note (Signed)
Incidental today I thik c/w arthrsitis/tendonisit, cant r/o gout - for refr Dr Burney Gauze whom she as seen prevoius

## 2013-12-19 NOTE — Assessment & Plan Note (Addendum)
Sudden onset yest without pain or fever, nondiscrete, soft but per pt related to dysphagia;  I question simple arthritis but will need CT chest no CM to further eval - will order now  Note:  Total time for pt hx, exam, review of record with pt in the room, determination of diagnoses and plan for further eval and tx is > 40 min, with over 50% spent in coordination and counseling of patient

## 2013-12-19 NOTE — Assessment & Plan Note (Signed)
Ok to follow for now 

## 2013-12-20 ENCOUNTER — Inpatient Hospital Stay: Admission: RE | Admit: 2013-12-20 | Payer: Medicare Other | Source: Ambulatory Visit

## 2013-12-23 ENCOUNTER — Telehealth: Payer: Self-pay | Admitting: Internal Medicine

## 2013-12-23 NOTE — Telephone Encounter (Signed)
Response to pt question:  Question: is there anything else you would like to ask your physician? Answer: what do I do about the lump on my throat   OK to refer further questions to Dr Kaplan/GI on swallowing as you have been seen very recently for this with the EGD as well

## 2013-12-25 MED ORDER — MONTELUKAST SODIUM 10 MG PO TABS: 10.0000 mg | ORAL_TABLET | Freq: Every day | ORAL | Status: DC

## 2013-12-25 MED ORDER — IRBESARTAN 150 MG PO TABS: 150.0000 mg | ORAL_TABLET | Freq: Every day | ORAL | Status: DC

## 2013-12-25 MED ORDER — CYCLOBENZAPRINE HCL 5 MG PO TABS: 5.0000 mg | ORAL_TABLET | Freq: Three times a day (TID) | ORAL | Status: DC | PRN

## 2013-12-25 NOTE — Addendum Note (Signed)
Addended by: Sharon Seller B on: 12/25/2013 10:32 AM   Modules accepted: Orders

## 2013-12-25 NOTE — Telephone Encounter (Signed)
Medications refilled to [facility] by Mail.

## 2013-12-25 NOTE — Telephone Encounter (Signed)
Patient informed. 

## 2013-12-26 DIAGNOSIS — M654 Radial styloid tenosynovitis [de Quervain]: Secondary | ICD-10-CM | POA: Diagnosis not present

## 2013-12-27 ENCOUNTER — Ambulatory Visit (INDEPENDENT_AMBULATORY_CARE_PROVIDER_SITE_OTHER): Payer: Medicare Other | Admitting: General Practice

## 2013-12-27 DIAGNOSIS — G459 Transient cerebral ischemic attack, unspecified: Secondary | ICD-10-CM

## 2013-12-27 DIAGNOSIS — Z8679 Personal history of other diseases of the circulatory system: Secondary | ICD-10-CM

## 2013-12-27 DIAGNOSIS — Z7901 Long term (current) use of anticoagulants: Secondary | ICD-10-CM

## 2013-12-27 DIAGNOSIS — Z5181 Encounter for therapeutic drug level monitoring: Secondary | ICD-10-CM

## 2013-12-27 LAB — POCT INR: INR: 2.3

## 2013-12-27 NOTE — Progress Notes (Signed)
Pre-visit discussion using our clinic review tool. No additional management support is needed unless otherwise documented below in the visit note.  

## 2013-12-29 ENCOUNTER — Encounter: Payer: Self-pay | Admitting: Internal Medicine

## 2013-12-30 ENCOUNTER — Ambulatory Visit (INDEPENDENT_AMBULATORY_CARE_PROVIDER_SITE_OTHER): Payer: Medicare Other | Admitting: Critical Care Medicine

## 2013-12-30 ENCOUNTER — Encounter: Payer: Self-pay | Admitting: Critical Care Medicine

## 2013-12-30 VITALS — BP 132/64 | HR 83 | Ht 63.0 in | Wt 162.4 lb

## 2013-12-30 DIAGNOSIS — J439 Emphysema, unspecified: Secondary | ICD-10-CM

## 2013-12-30 DIAGNOSIS — J438 Other emphysema: Secondary | ICD-10-CM | POA: Diagnosis not present

## 2013-12-30 DIAGNOSIS — J449 Chronic obstructive pulmonary disease, unspecified: Secondary | ICD-10-CM | POA: Diagnosis not present

## 2013-12-30 MED ORDER — BUDESONIDE-FORMOTEROL FUMARATE 160-4.5 MCG/ACT IN AERO: 2.0000 | INHALATION_SPRAY | Freq: Two times a day (BID) | RESPIRATORY_TRACT | Status: DC

## 2013-12-30 MED ORDER — ALBUTEROL SULFATE HFA 108 (90 BASE) MCG/ACT IN AERS: INHALATION_SPRAY | RESPIRATORY_TRACT | Status: DC

## 2013-12-30 MED ORDER — AZELASTINE HCL 0.1 % NA SOLN: NASAL | Status: DC

## 2013-12-30 NOTE — Assessment & Plan Note (Signed)
Chronic obstructive lung disease COPD with gold stage B. with recent exacerbation now improving Plan Maintain inhaled medications as prescribed Maintain nocturnal oxygen therapy Will have oxygen concentrator evaluated by the home DME company for equipment check

## 2013-12-30 NOTE — Patient Instructions (Addendum)
No change in medications. Return in    4 months     [facility]  We will ask [employer] to assess water in lines for concentrator

## 2013-12-30 NOTE — Progress Notes (Signed)
Subjective:    Patient ID: [name], female    DOB: January 09, 1941, 73 y.o.   MRN: [id number]  HPI  73 y.o. female with known of COPD   12/30/2013 Chief Complaint  Patient presents with  . 2 month follow up    Breathing is "fairly good' overall. Dx with shingles on 12/16 - hasn't been able to do much since this including pulm rehab.  No wheezing, chest tightness/pain, or cough at this time.  Has been on several rounds of ABX , last Avelox 11/25/13.  Shingles came out on R arm for one month.  Now off ABX and off thrush.  Not clear on the oxygen.  [facility] is DME, issue with water in lines.  Empty water daytime.    Review of Systems  Constitutional:   No  weight loss, night sweats,  Fevers, chills, fatigue, or  lassitude.  HEENT:   No headaches,  Difficulty swallowing,  Tooth/dental problems, or  Sore throat,                No sneezing, itching, ear ache, nasal congestion, post nasal drip,   CV:  No chest pain,  Orthopnea, PND, swelling in lower extremities, anasarca, dizziness, palpitations, syncope.   GI  No heartburn, indigestion, abdominal pain, nausea, vomiting, diarrhea, change in bowel habits, loss of appetite, bloody stools.   Resp:    No chest wall deformity  Skin: no rash or lesions.  GU: no dysuria, change in color of urine, no urgency or frequency.  No flank pain, no hematuria   MS:  No joint pain or swelling.  No decreased range of motion.  No back pain.  Psych:  No change in mood or affect. No depression or anxiety.  No memory loss.     Objective:   Physical Exam BP 132/64  Pulse 83  Ht 5\' 3"  (1.6 m)  Wt 162 lb 6.4 oz (73.664 kg)  BMI 28.78 kg/m2  SpO2 94%  GEN: A/Ox3; pleasant , NAD, well nourished   HEENT:  [place]/AT,  EACs-clear, TMs-wnl, NOSE-clear, THROAT-clear, no lesions, no postnasal drip or exudate noted.   NECK:  Supple w/ fair ROM; no JVD; normal carotid impulses w/o bruits; no thyromegaly or nodules palpated; no lymphadenopathy.  RESP  distant  breath sounds.no accessory muscle use, no dullness to percussion  CARD:  RRR, no m/r/g  , no peripheral edema, pulses intact, no cyanosis or clubbing., Ted hose   GI:   Soft & nt; nml bowel sounds; no organomegaly or masses detected.  Musco: Warm bil, no deformities or joint swelling noted.   Neuro: alert, no focal deficits noted.    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 with recent exacerbation now improving Plan Maintain inhaled medications as prescribed Maintain nocturnal oxygen therapy Will have oxygen concentrator evaluated by the home DME company for equipment check    Updated Medication List Outpatient Encounter Prescriptions as of 12/30/2013  Medication Sig  . albuterol (PROAIR HFA) 108 (90 BASE) MCG/ACT inhaler 1-2 puffs every 4-6 hours as needed Inhale into the lungs. 1-2 puffs every 4-6 hours as needed  . amLODipine (NORVASC) 5 MG tablet Take 1 tablet (5 mg total) by mouth daily.  Marland Kitchen azelastine (ASTELIN) 137 MCG/SPRAY nasal spray Place 2 sprays into the nose daily. Use in each nostril as directed  . benzonatate (TESSALON)  100 MG capsule Take 100 mg by mouth 3 (three) times daily as needed.  . budesonide-formoterol (SYMBICORT) 160-4.5 MCG/ACT inhaler Inhale 2 puffs into the lungs 2 (two) times daily.  . Calcium Carbonate-Vitamin D (CALCIUM + D PO) Take 1 tablet by mouth 2 (two) times daily.   . citalopram (CELEXA) 10 MG tablet Take 1 tablet (10 mg total) by mouth daily.  . cyclobenzaprine (FLEXERIL) 5 MG tablet Take 1 tablet (5 mg total) by mouth 3 (three) times daily as needed for muscle spasms.  Marland Kitchen estradiol (CLIMARA) 0.05 mg/24hr patch Place 1 patch (0.05 mg total) onto the skin once a week.  . fluocinonide (LIDEX) 0.05 % cream Apply topically 2 (two) times daily as needed. Use as directed twice per day as needed  . gabapentin  (NEURONTIN) 300 MG capsule Take 1 capsule (300 mg total) by mouth 3 (three) times daily.  . irbesartan (AVAPRO) 150 MG tablet Take 1 tablet (150 mg total) by mouth daily.  . meloxicam (MOBIC) 15 MG tablet Take 1 tablet (15 mg total) by mouth daily as needed for pain.  . montelukast (SINGULAIR) 10 MG tablet Take 1 tablet (10 mg total) by mouth daily.  . Multiple Vitamin (MULTIVITAMIN) tablet Take 1 tablet by mouth daily.    Marland Kitchen omeprazole (PRILOSEC) 20 MG capsule Take 1 capsule (20 mg total) by mouth daily.  Marland Kitchen oxyCODONE (OXY IR/ROXICODONE) 5 MG immediate release tablet Take 1 tablet by mouth every 6 (six) hours as needed.  Marland Kitchen scopolamine (TRANSDERM-SCOP) 1.5 MG Place 1 patch onto the skin as needed.   . warfarin (COUMADIN) 5 MG tablet Take as directed by coumadin clinic  . [DISCONTINUED] albuterol (PROAIR HFA) 108 (90 BASE) MCG/ACT inhaler Inhale into the lungs. 1-2 puffs every 4-6 hours as needed   . [DISCONTINUED] azelastine (ASTELIN) 137 MCG/SPRAY nasal spray Place 2 sprays into the nose daily. Use in each nostril as directed  . [DISCONTINUED] budesonide-formoterol (SYMBICORT) 160-4.5 MCG/ACT inhaler Inhale 2 puffs into the lungs 2 (two) times daily.  . [DISCONTINUED] clotrimazole (MYCELEX) 10 MG troche Take 1 tablet (10 mg total) by mouth 5 (five) times daily.  . [DISCONTINUED] fluconazole (DIFLUCAN) 150 MG tablet Take 1 tablet (150 mg total) by mouth daily.

## 2013-12-31 MED ORDER — GABAPENTIN 300 MG PO CAPS: ORAL_CAPSULE | ORAL | Status: DC

## 2013-12-31 MED ORDER — LIDOCAINE 5 % EX PTCH: 1.0000 | MEDICATED_PATCH | CUTANEOUS | Status: DC

## 2013-12-31 NOTE — Telephone Encounter (Signed)
Ok to take 2 tabs at three times per day as needed; also lidoderm patch is sent to [facility]

## 2014-01-01 DIAGNOSIS — M75120 Complete rotator cuff tear or rupture of unspecified shoulder, not specified as traumatic: Secondary | ICD-10-CM | POA: Diagnosis not present

## 2014-01-01 DIAGNOSIS — M25519 Pain in unspecified shoulder: Secondary | ICD-10-CM | POA: Diagnosis not present

## 2014-01-03 ENCOUNTER — Encounter: Payer: Self-pay | Admitting: Gastroenterology

## 2014-01-03 ENCOUNTER — Ambulatory Visit (INDEPENDENT_AMBULATORY_CARE_PROVIDER_SITE_OTHER): Payer: Medicare Other | Admitting: Gastroenterology

## 2014-01-03 VITALS — BP 154/54 | HR 60 | Ht 63.0 in | Wt 160.2 lb

## 2014-01-03 DIAGNOSIS — R1310 Dysphagia, unspecified: Secondary | ICD-10-CM | POA: Diagnosis not present

## 2014-01-03 DIAGNOSIS — R1319 Other dysphagia: Secondary | ICD-10-CM | POA: Diagnosis not present

## 2014-01-03 NOTE — Progress Notes (Signed)
          History of Present Illness:  [name] has returned for reevaluation of dysphagia.  In November she underwent Maloney dilation of the early distal esophageal stricture.  Dysphagia has subsided.  Several weeks ago she developed a herpetic infection involving the right upper extremity extending to the neck.  She noticed swelling in the area of the suprasternal notch.  With swelling she has developed dysphagia to solids and liquids.  This seems to occur immediately with swallowing and she often has to expectorate the contents.  The swelling has decreased somewhat but dysphagia continues.  CT of the chest was negative.    Review of Systems: Pertinent positive and negative review of systems were noted in the above HPI section. All other review of systems were otherwise negative.    Current Medications, Allergies, Past Medical History, Past Surgical History, Family History and Social History were reviewed in [place] record  Vital signs were reviewed in today's medical record. Physical Exam: General: Well developed , well nourished, no acute distress Skin: anicteric Head: Normocephalic and atraumatic Eyes:  sclerae anicteric, EOMI Ears: Normal auditory acuity Mouth: No deformity or lesions Neck: There is no lymphadenopathy.  There is fullness and swelling in the area of the suprasternal notch but no fixed mass  Neurological: Alert oriented x 4, grossly nonfocal Psychological:  Alert and cooperative. Normal mood and affect  See Assessment and Plan under Problem List

## 2014-01-03 NOTE — Assessment & Plan Note (Signed)
Previous dysphagia was probably due to a distal early esophageal stricture.  What she describes now is dysphagia specifically in the area of swelling in the suprasternal notch.  Dysphagia could be do to extrinsic compression of the cervical esophagus.  Recommendations #1 barium swallow

## 2014-01-03 NOTE — Patient Instructions (Signed)
You have been scheduled for a Barium Esophogram at [facility] (1st floor of the hospital) on Tuesday, 01/07/14 at 10:00 am. Please arrive 15 minutes prior to your appointment for registration. Make certain not to have anything to eat or drink 6 hours prior to your test. If you need to reschedule for any reason, please contact radiology at [phone] to do so. __________________________________________________________________ A barium swallow is an examination that concentrates on views of the esophagus. This tends to be a double contrast exam (barium and two liquids which, when combined, create a gas to distend the wall of the oesophagus) or single contrast (non-ionic iodine based). The study is usually tailored to your symptoms so a good history is essential. Attention is paid during the study to the form, structure and configuration of the esophagus, looking for functional disorders (such as aspiration, dysphagia, achalasia, motility and reflux) EXAMINATION You may be asked to change into a gown, depending on the type of swallow being performed. A radiologist and radiographer will perform the procedure. The radiologist will advise you of the type of contrast selected for your procedure and direct you during the exam. You will be asked to stand, sit or lie in several different positions and to hold a small amount of fluid in your mouth before being asked to swallow while the imaging is performed .In some instances you may be asked to swallow barium coated marshmallows to assess the motility of a solid food bolus. The exam can be recorded as a digital or video fluoroscopy procedure. POST PROCEDURE It will take 1-2 days for the barium to pass through your system. To facilitate this, it is important, unless otherwise directed, to increase your fluids for the next 24-48hrs and to resume your normal diet.  This test typically takes about 30 minutes to  perform. __________________________________________________________________________________

## 2014-01-07 ENCOUNTER — Ambulatory Visit (HOSPITAL_COMMUNITY)
Admission: RE | Admit: 2014-01-07 | Discharge: 2014-01-07 | Disposition: A | Discharge status: Home / Self Care | Payer: Medicare Other | Source: Ambulatory Visit | Attending: Gastroenterology | Admitting: Gastroenterology

## 2014-01-07 ENCOUNTER — Encounter: Payer: Self-pay | Admitting: Internal Medicine

## 2014-01-07 DIAGNOSIS — K222 Esophageal obstruction: Secondary | ICD-10-CM | POA: Diagnosis not present

## 2014-01-07 DIAGNOSIS — K449 Diaphragmatic hernia without obstruction or gangrene: Secondary | ICD-10-CM | POA: Diagnosis not present

## 2014-01-07 DIAGNOSIS — R1319 Other dysphagia: Secondary | ICD-10-CM

## 2014-01-07 DIAGNOSIS — R1310 Dysphagia, unspecified: Secondary | ICD-10-CM | POA: Diagnosis not present

## 2014-01-07 MED ORDER — OXYCODONE HCL 5 MG PO TABS: ORAL_TABLET | ORAL | Status: DC

## 2014-01-07 NOTE — Addendum Note (Signed)
Addended by: Biagio Borg on: 01/07/2014 07:24 PM   Modules accepted: Orders, Medications

## 2014-01-07 NOTE — Telephone Encounter (Signed)
Done hardcopy to robin  

## 2014-01-08 ENCOUNTER — Encounter: Payer: Self-pay | Admitting: Internal Medicine

## 2014-01-08 ENCOUNTER — Encounter: Payer: Self-pay | Admitting: Gastroenterology

## 2014-01-08 DIAGNOSIS — Z79899 Other long term (current) drug therapy: Secondary | ICD-10-CM | POA: Diagnosis not present

## 2014-01-08 NOTE — Telephone Encounter (Signed)
Patient informed to pickup hardcopy herself today as would need to sign a contract for controlled meds.  Patient did understand instructions.

## 2014-01-09 ENCOUNTER — Telehealth: Payer: Self-pay | Admitting: Gastroenterology

## 2014-01-09 NOTE — Telephone Encounter (Signed)
Pt scheduled for EGD with dil 02/03/14. Pt aware of appt and previsit scheduled. Letter sent to Dr. Jenny Reichmann regarding coumadin.

## 2014-01-14 ENCOUNTER — Encounter: Payer: Self-pay | Admitting: Internal Medicine

## 2014-01-15 ENCOUNTER — Other Ambulatory Visit: Payer: Self-pay

## 2014-01-15 MED ORDER — WARFARIN SODIUM 5 MG PO TABS: ORAL_TABLET | ORAL | Status: DC

## 2014-01-15 MED ORDER — MONTELUKAST SODIUM 10 MG PO TABS: 10.0000 mg | ORAL_TABLET | Freq: Every day | ORAL | Status: DC

## 2014-01-15 MED ORDER — IRBESARTAN 150 MG PO TABS: 150.0000 mg | ORAL_TABLET | Freq: Every day | ORAL | Status: DC

## 2014-01-20 ENCOUNTER — Telehealth: Payer: Self-pay | Admitting: General Practice

## 2014-01-20 NOTE — Telephone Encounter (Signed)
Message copied by Warden Fillers on Mon Jan 20, 2014  7:56 AM ------      Message from: Biagio Borg      Created: Sun Jan 19, 2014 12:51 PM      Regarding: FW: Coumadin       OK for off Coumadin x 5 days prior            Note to coumadin clinic as well      ----- Message -----         From: Maury Dus, RN         Sent: 01/09/2014   1:33 PM           To: Biagio Borg, MD      Subject: Coumadin                                                 01/09/2014                  RE: [name]      DOB: 07-31-1941      MRN: [id number]                  Dear Dr. Jenny Reichmann,                  We have scheduled the above patient for an endoscopic procedure. Our records show that she is on anticoagulation therapy.             Please advise as to how long the patient may come off her therapy of Coumadin prior to the procedure, which is scheduled for 02/03/14.            Please fax the competed letter back to [phone], or route back to [facility].             Sincerely,                        Rosanne Sack                                ------

## 2014-01-20 NOTE — Telephone Encounter (Signed)
Pt may hold coumadin per Dr. Jenny Reichmann, see below.   Biagio Borg, MD Maury Dus, RN Cc: Warden Fillers, RN            OK for off Coumadin x 5 days prior   Note to coumadin clinic as well

## 2014-01-23 ENCOUNTER — Ambulatory Visit (INDEPENDENT_AMBULATORY_CARE_PROVIDER_SITE_OTHER): Payer: Medicare Other | Admitting: Critical Care Medicine

## 2014-01-23 ENCOUNTER — Encounter: Payer: Self-pay | Admitting: Critical Care Medicine

## 2014-01-23 VITALS — BP 118/62 | HR 90 | Temp 98.0°F | Ht 63.0 in | Wt 159.8 lb

## 2014-01-23 DIAGNOSIS — J449 Chronic obstructive pulmonary disease, unspecified: Secondary | ICD-10-CM | POA: Diagnosis not present

## 2014-01-23 NOTE — Patient Instructions (Signed)
Stay on oxygen at night and as needed daytime Get esophagus stretched Stay on symbicort twice daily Return 2 months [place]

## 2014-01-23 NOTE — Progress Notes (Signed)
Subjective:    Patient ID: [name], female    DOB: 1941/05/16, 73 y.o.   MRN: [id number]  HPI  73 y.o. female with known of COPD   01/23/2014 Chief Complaint  Patient presents with  . 3 wk follow up    Pt states her breathing has much imporved since stopped taking pain medication on 01/19/14. Complains of DOE, nonproductive cough, intermittent wheezing, weakness and trouble swallowing d/t lump on mid upper chest. Denies CP and chest tightness. P  Better off pain meds.  Pt had been off oxycontin x 4 days. Was on it for shingles.  This has helped to be off the med. Using oxygen at daytime.  No real energy.   Sats to 87%.  Pt in pulm rehab.  Pt with shingles, pain is still sensitive. Cough is min prod.  Lump on neck area   Review of Systems  Constitutional:   No  weight loss, night sweats,  Fevers, chills, fatigue, or  lassitude.  HEENT:   No headaches,  Difficulty swallowing,  Tooth/dental problems, or  Sore throat,                No sneezing, itching, ear ache, nasal congestion, post nasal drip,   CV:  No chest pain,  Orthopnea, PND, swelling in lower extremities, anasarca, dizziness, palpitations, syncope.   GI  No heartburn, indigestion, abdominal pain, nausea, vomiting, diarrhea, change in bowel habits, loss of appetite, bloody stools.   Resp:    No chest wall deformity  Skin: no rash or lesions.  GU: no dysuria, change in color of urine, no urgency or frequency.  No flank pain, no hematuria   MS:  No joint pain or swelling.  No decreased range of motion.  No back pain.  Psych:  No change in mood or affect. No depression or anxiety.  No memory loss.     Objective:   Physical Exam BP 118/62  Pulse 90  Temp(Src) 98 F (36.7 C) (Oral)  Ht 5\' 3"  (1.6 m)  Wt 159 lb 12.8 oz (72.485 kg)  BMI 28.31 kg/m2  SpO2 93%  GEN: A/Ox3; pleasant , NAD, well nourished   HEENT:  [place]/AT,  EACs-clear, TMs-wnl, NOSE-clear, THROAT-clear, no lesions, no postnasal drip or exudate  noted.   NECK:  Supple w/ fair ROM; no JVD; normal carotid impulses w/o bruits; no thyromegaly or nodules palpated; no lymphadenopathy.  RESP  distant breath sounds.no accessory muscle use, no dullness to percussion  CARD:  RRR, no m/r/g  , no peripheral edema, pulses intact, no cyanosis or clubbing., Ted hose   GI:   Soft & nt; nml bowel sounds; no organomegaly or masses detected.  Musco: Warm bil, no deformities or joint swelling noted.   Neuro: alert, no focal deficits noted.    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 albuterol (PROAIR HFA) 108 (90 BASE) MCG/ACT inhaler 1-2 puffs every 4-6 hours as needed Inhale into the lungs. 1-2  puffs every 4-6 hours as needed  . amLODipine (NORVASC) 5 MG tablet Take 1 tablet (5 mg total) by mouth daily.  Marland Kitchen azelastine (ASTELIN) 137 MCG/SPRAY nasal spray Place 2 sprays into the nose daily. Use in each nostril as directed  . benzonatate (TESSALON) 100 MG capsule Take 100 mg by mouth 3 (three) times daily as needed.  . budesonide-formoterol (SYMBICORT) 160-4.5 MCG/ACT inhaler Inhale 2 puffs into the lungs 2 (two) times daily.  . Calcium Carbonate-Vitamin D (CALCIUM + D PO) Take 1 tablet by mouth 2 (two) times daily.   . citalopram (CELEXA) 10 MG tablet Take 1 tablet (10 mg total) by mouth daily.  . cyclobenzaprine (FLEXERIL) 5 MG tablet Take 1 tablet (5 mg total) by mouth 3  (three) times daily as needed for muscle spasms.  Marland Kitchen estradiol (CLIMARA) 0.05 mg/24hr patch Place 1 patch (0.05 mg total) onto the skin once a week.  . fluocinonide (LIDEX) 0.05 % cream Apply topically 2 (two) times daily as needed. Use as directed twice per day as needed  . gabapentin (NEURONTIN) 300 MG capsule 2 tabs by mouth three times daily as needed for pain  . irbesartan (AVAPRO) 150 MG tablet Take 1 tablet (150 mg total) by mouth daily.  . meloxicam (MOBIC) 15 MG tablet Take 1 tablet (15 mg total) by mouth daily as needed for pain.  . montelukast (SINGULAIR) 10 MG tablet Take 1 tablet (10 mg total) by mouth daily.  . Multiple Vitamin (MULTIVITAMIN) tablet Take 1 tablet by mouth daily.    Marland Kitchen omeprazole (PRILOSEC) 20 MG capsule Take 1 capsule (20 mg total) by mouth daily.  Marland Kitchen scopolamine (TRANSDERM-SCOP) 1.5 MG Place 1 patch onto the skin as needed.   . warfarin (COUMADIN) 5 MG tablet Take as directed by coumadin clinic  . [DISCONTINUED] lidocaine (LIDODERM) 5 % Place 1 patch onto the skin daily. Remove & Discard patch within 12 hours or as directed by MD  . [DISCONTINUED] oxyCODONE (OXY IR/ROXICODONE) 5 MG immediate release tablet 1-2 tabs by mouth every 6 hrs as needed for severe pain

## 2014-01-24 ENCOUNTER — Ambulatory Visit (INDEPENDENT_AMBULATORY_CARE_PROVIDER_SITE_OTHER): Payer: Medicare Other | Admitting: General Practice

## 2014-01-24 DIAGNOSIS — Z7901 Long term (current) use of anticoagulants: Secondary | ICD-10-CM

## 2014-01-24 DIAGNOSIS — G459 Transient cerebral ischemic attack, unspecified: Secondary | ICD-10-CM | POA: Diagnosis not present

## 2014-01-24 DIAGNOSIS — Z8679 Personal history of other diseases of the circulatory system: Secondary | ICD-10-CM

## 2014-01-24 DIAGNOSIS — Z5181 Encounter for therapeutic drug level monitoring: Secondary | ICD-10-CM

## 2014-01-24 LAB — POCT INR: INR: 1.5

## 2014-01-24 NOTE — Assessment & Plan Note (Signed)
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

## 2014-01-24 NOTE — Progress Notes (Signed)
Pre visit review using our clinic review tool, if applicable. No additional management support is needed unless otherwise documented below in the visit note. 

## 2014-01-24 NOTE — Patient Instructions (Addendum)
Instructions for coumadin pre and post procedure.  2/25 - Take last dose of coumadin until after procedure.  3/2 - Procedure  3/3 - Take 7.5 mg of coumadin 3/4 - Take 7.5 mg of coumadin 3/5 - Take 5 mg of coumadin 3/6 - Tale 5 mg of coumadin 3/7 - Continue current dosage of 2.5 mg all days except 5 mg on M/W/F.

## 2014-01-27 ENCOUNTER — Ambulatory Visit (AMBULATORY_SURGERY_CENTER): Payer: Medicare Other | Admitting: *Deleted

## 2014-01-27 ENCOUNTER — Telehealth: Payer: Self-pay | Admitting: *Deleted

## 2014-01-27 VITALS — Ht 63.0 in | Wt 157.2 lb

## 2014-01-27 DIAGNOSIS — R1310 Dysphagia, unspecified: Secondary | ICD-10-CM

## 2014-01-27 DIAGNOSIS — Z9981 Dependence on supplemental oxygen: Secondary | ICD-10-CM | POA: Insufficient documentation

## 2014-01-27 NOTE — Telephone Encounter (Signed)
During Pv pts respiratory status stable, no distress noted. No complaints from pt or spouse. Please schedule as directed per dr Deatra Ina at the hospital.  Pt and husband notified of plan to schedule at hospital.  thanks, [facility]

## 2014-01-27 NOTE — Telephone Encounter (Signed)
Provided that her pulmonary function is stable, she can be directly scheduled for EGD and Savary dilatation, under fluoroscopy, at the hospital.  Thanks. RK

## 2014-01-27 NOTE — Progress Notes (Signed)
NO EGG OR SOY ALLERGY. EWM NO PROBLEMS WITH PAST SEDATION. EWM PT STATES SHE USES 2L 02 VIA NASAL CANNULA AT NIGHT ONLY DUE TO COPD, SHORTNESS OF BREATH. PT STATES SHE DID USE THE 02 24 HOURS A DAY NOW JUST AT NIGHT.  EWM PER J.NULTY CRNA PT WILL HAVE TO HAVE HER PROCEDURE DONE AT [facility] DUE TO HER HOME 02 USE. NOTE TO DR Gaston CASE. EWM

## 2014-01-27 NOTE — Telephone Encounter (Signed)
DR Deatra Ina, I SAW THIS LADY IN [facility] THIS AM. SHE IS SCHEDULED WITH YOU IN THE [facility] ON 02-03-14 FOR AN EGD WITH DIL BUT SHE IS ON HOME 02 OF 2L VIA [place] AT NIGHT. [place] CRNA, SHE WILL HAVE TO BE DONE AT [facility]. SHE HAD AN EGD IN November HERE AND AFTER THIS WAS PLACED ON THE 02 FOR COPD AND SHORTNESS OF BREATH.  SHE IS ALSO ON COUMADIN, WHICH WAS OK'D FOR 5 DAY HOLD FOR THE PROCEDURE ON 02-03-14. I DIDN'T KNOW IF YOU WANTED TO SEE HER AGAIN AS YOU SAW HER IN THE OFFICE 01-03-2014 OR JUST A DIRECT HOSPITAL PROCEDURE. PLEASE ADVISE.  THANKS,  MARIE [facility]

## 2014-01-28 ENCOUNTER — Telehealth: Payer: Self-pay | Admitting: Gastroenterology

## 2014-01-28 NOTE — Telephone Encounter (Signed)
Discussed with pt that I would call her once I can reschedule the procedure at the hospital.

## 2014-01-29 ENCOUNTER — Other Ambulatory Visit: Payer: Self-pay

## 2014-01-29 DIAGNOSIS — K222 Esophageal obstruction: Secondary | ICD-10-CM

## 2014-01-29 NOTE — Telephone Encounter (Signed)
Pts EGD with dil rescheduled at [facility] 03/03/14@12 :30pm. Pt to arrive at the hospital at 11am. May have clear liquids until 8:30am. Coumadin clinic has instructed pt regarding how to take her coumadin prior to the procedure. Pt aware of appt date and time.

## 2014-02-03 ENCOUNTER — Encounter (HOSPITAL_COMMUNITY): Payer: Self-pay | Admitting: *Deleted

## 2014-02-03 ENCOUNTER — Telehealth: Payer: Self-pay | Admitting: Critical Care Medicine

## 2014-02-03 ENCOUNTER — Encounter: Payer: Medicare Other | Admitting: Gastroenterology

## 2014-02-03 NOTE — Telephone Encounter (Signed)
ROV w/ PW in [place] set for 03/11/14 @ 4:00 PM.  Nothing further needed at this time.  Satira Anis

## 2014-02-04 ENCOUNTER — Encounter: Payer: Self-pay | Admitting: Internal Medicine

## 2014-02-04 ENCOUNTER — Other Ambulatory Visit: Payer: Self-pay

## 2014-02-04 DIAGNOSIS — M999 Biomechanical lesion, unspecified: Secondary | ICD-10-CM | POA: Diagnosis not present

## 2014-02-04 MED ORDER — CITALOPRAM HYDROBROMIDE 10 MG PO TABS: 10.0000 mg | ORAL_TABLET | Freq: Every day | ORAL | Status: DC

## 2014-02-05 DIAGNOSIS — H524 Presbyopia: Secondary | ICD-10-CM | POA: Diagnosis not present

## 2014-02-05 DIAGNOSIS — H04129 Dry eye syndrome of unspecified lacrimal gland: Secondary | ICD-10-CM | POA: Diagnosis not present

## 2014-02-05 DIAGNOSIS — Z961 Presence of intraocular lens: Secondary | ICD-10-CM | POA: Diagnosis not present

## 2014-02-10 ENCOUNTER — Encounter (HOSPITAL_COMMUNITY): Payer: Self-pay | Admitting: *Deleted

## 2014-02-10 ENCOUNTER — Encounter (HOSPITAL_COMMUNITY): Payer: Self-pay | Admitting: Pharmacy Technician

## 2014-02-11 DIAGNOSIS — M999 Biomechanical lesion, unspecified: Secondary | ICD-10-CM | POA: Diagnosis not present

## 2014-02-18 DIAGNOSIS — M999 Biomechanical lesion, unspecified: Secondary | ICD-10-CM | POA: Diagnosis not present

## 2014-02-21 ENCOUNTER — Ambulatory Visit (INDEPENDENT_AMBULATORY_CARE_PROVIDER_SITE_OTHER): Payer: Medicare Other | Admitting: General Practice

## 2014-02-21 DIAGNOSIS — Z8679 Personal history of other diseases of the circulatory system: Secondary | ICD-10-CM | POA: Diagnosis not present

## 2014-02-21 DIAGNOSIS — Z5181 Encounter for therapeutic drug level monitoring: Secondary | ICD-10-CM

## 2014-02-21 DIAGNOSIS — Z7901 Long term (current) use of anticoagulants: Secondary | ICD-10-CM | POA: Diagnosis not present

## 2014-02-21 DIAGNOSIS — G459 Transient cerebral ischemic attack, unspecified: Secondary | ICD-10-CM | POA: Diagnosis not present

## 2014-02-21 LAB — POCT INR: INR: 1.7

## 2014-02-21 NOTE — Patient Instructions (Addendum)
Instructions for coumadin pre and post procedure.  3/25 - Take last dose of coumadin until after procedure.  3/31 - Take 7.5 mg coumadin 4/1 - Take 7.5 mg coumadin 4/2 - Take 5 mg coumadin 4/3 - Take 5 mg of coumadin 4/4 - Resume current dosage  Re-check INR on 4-7  Patient verbalizes understanding.

## 2014-02-21 NOTE — Progress Notes (Signed)
Pre visit review using our clinic review tool, if applicable. No additional management support is needed unless otherwise documented below in the visit note. 

## 2014-03-03 ENCOUNTER — Ambulatory Visit (HOSPITAL_COMMUNITY)
Admission: RE | Admit: 2014-03-03 | Discharge: 2014-03-03 | Disposition: A | Discharge status: Home / Self Care | Payer: Medicare Other | Source: Ambulatory Visit | Attending: Gastroenterology | Admitting: Gastroenterology

## 2014-03-03 ENCOUNTER — Encounter (HOSPITAL_COMMUNITY): Payer: Medicare Other | Admitting: Anesthesiology

## 2014-03-03 ENCOUNTER — Encounter (HOSPITAL_COMMUNITY)
Admission: RE | Disposition: A | Discharge status: Home / Self Care | Payer: Self-pay | Source: Ambulatory Visit | Attending: Gastroenterology

## 2014-03-03 ENCOUNTER — Encounter (HOSPITAL_COMMUNITY): Payer: Self-pay

## 2014-03-03 ENCOUNTER — Ambulatory Visit (HOSPITAL_COMMUNITY): Payer: Medicare Other

## 2014-03-03 ENCOUNTER — Ambulatory Visit (HOSPITAL_COMMUNITY): Payer: Medicare Other | Admitting: Anesthesiology

## 2014-03-03 DIAGNOSIS — K222 Esophageal obstruction: Secondary | ICD-10-CM | POA: Insufficient documentation

## 2014-03-03 DIAGNOSIS — K219 Gastro-esophageal reflux disease without esophagitis: Secondary | ICD-10-CM | POA: Insufficient documentation

## 2014-03-03 DIAGNOSIS — Z87891 Personal history of nicotine dependence: Secondary | ICD-10-CM | POA: Insufficient documentation

## 2014-03-03 DIAGNOSIS — J449 Chronic obstructive pulmonary disease, unspecified: Secondary | ICD-10-CM | POA: Insufficient documentation

## 2014-03-03 DIAGNOSIS — I10 Essential (primary) hypertension: Secondary | ICD-10-CM | POA: Diagnosis not present

## 2014-03-03 DIAGNOSIS — Z9981 Dependence on supplemental oxygen: Secondary | ICD-10-CM | POA: Insufficient documentation

## 2014-03-03 DIAGNOSIS — K449 Diaphragmatic hernia without obstruction or gangrene: Secondary | ICD-10-CM | POA: Diagnosis not present

## 2014-03-03 DIAGNOSIS — J4489 Other specified chronic obstructive pulmonary disease: Secondary | ICD-10-CM | POA: Insufficient documentation

## 2014-03-03 HISTORY — PX: BALLOON DILATION: SHX5330

## 2014-03-03 HISTORY — DX: Zoster without complications: B02.9

## 2014-03-03 HISTORY — PX: ESOPHAGOGASTRODUODENOSCOPY: SHX5428

## 2014-03-03 SURGERY — EGD (ESOPHAGOGASTRODUODENOSCOPY)
Anesthesia: Monitor Anesthesia Care

## 2014-03-03 MED ORDER — PROPOFOL INFUSION 10 MG/ML OPTIME
INTRAVENOUS | Status: DC | PRN
  Administered 2014-03-03: 60 ug/kg/min via INTRAVENOUS

## 2014-03-03 MED ORDER — KETAMINE HCL 10 MG/ML IJ SOLN
INTRAMUSCULAR | Status: DC | PRN
  Administered 2014-03-03: 20 mg via INTRAVENOUS

## 2014-03-03 MED ORDER — LACTATED RINGERS IV SOLN
INTRAVENOUS | Status: DC
  Administered 2014-03-03: 12:00:00 via INTRAVENOUS

## 2014-03-03 MED ORDER — PROPOFOL 10 MG/ML IV BOLUS
INTRAVENOUS | Status: AC
  Filled 2014-03-03: qty 20

## 2014-03-03 MED ORDER — MIDAZOLAM HCL 5 MG/5ML IJ SOLN
INTRAMUSCULAR | Status: DC | PRN
  Administered 2014-03-03: 2 mg via INTRAVENOUS

## 2014-03-03 MED ORDER — SODIUM CHLORIDE 0.9 % IV SOLN: INTRAVENOUS | Status: DC

## 2014-03-03 MED ORDER — MIDAZOLAM HCL 2 MG/2ML IJ SOLN
INTRAMUSCULAR | Status: AC
  Filled 2014-03-03: qty 2

## 2014-03-03 NOTE — Transfer of Care (Signed)
Immediate Anesthesia Transfer of Care Note  Patient: [name]  Procedure(s) Performed: Procedure(s): ESOPHAGOGASTRODUODENOSCOPY (EGD) (N/A) BALLOON DILATION (N/A)  Patient Location: PACU  Anesthesia Type:MAC  Level of Consciousness: awake, alert  and oriented  Airway & Oxygen Therapy: Patient Spontanous Breathing and Patient connected to nasal cannula oxygen  Post-op Assessment: Report given to PACU RN and Post -op Vital signs reviewed and stable  Post vital signs: Reviewed and stable  Complications: No apparent anesthesia complications

## 2014-03-03 NOTE — Op Note (Signed)
[facility] [place], [address]   ENDOSCOPY PROCEDURE REPORT  PATIENT: Lauren, Ray  MR#: [id number] BIRTHDATE: 06-02-1941 , 54  yrs. old GENDER: Female ENDOSCOPIST: Inda Castle, MD ASSISTANT:   Tedra Coupe, Luanne Bras, RN CGRN REFERRED YT:KPTWS John, M.D. PROCEDURE DATE:  03/03/2014 PROCEDURE:   EGD with dilatation over guidewire ASA CLASS:   Class II INDICATIONS:dysphagia.   barium swallow demonstrates a distal esophageal stricture MEDICATIONS: MAC sedation, administered by CRNA    Resume Coumadin today TOPICAL ANESTHETIC:  DESCRIPTION OF PROCEDURE:   After the risks benefits and alternatives of the procedure were thoroughly explained, informed consent was obtained.  The     endoscope was introduced through the mouth  and advanced to the third portion of the duodenum ,      The instrument was slowly withdrawn as the mucosa was carefully examined.    A nonobstructing esophageal stricture was present.  The 9 mm gastroscope easily traversed the stricture.  There was a 2 cm sliding hiatal hernia.   The remainder of the upper endoscopy exam was otherwise normal.     Dilation was then performed at the gastroesphageal junction  Dilator:Savary over guidewire Size:53mm  Reststance:minimal Heme:none  dilator was passed under fluoroscopic guidance  COMPLICATIONS: There were no complications. ENDOSCOPIC IMPRESSION: 1.   early esophageal stricture-status post Savary dilatation  RECOMMENDATIONS: reassess in 3-4 weeks Resume Coumadin today    eSigned:  Inda Castle, MD 03/03/2014 1:03 PM  CC:

## 2014-03-03 NOTE — H&P (Signed)
  History of Present Illness: Mrs. Izzo has returned for reevaluation of dysphagia. In November she underwent Maloney dilation of the early distal esophageal stricture. Dysphagia has subsided. Several weeks ago she developed a herpetic infection involving the right upper extremity extending to the neck. She noticed swelling in the area of the suprasternal notch. With swelling she has developed dysphagia to solids and liquids. This seems to occur immediately with swallowing and she often has to expectorate the contents. The swelling has decreased somewhat but dysphagia continues. CT of the chest was negative. 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  Review of Systems: Pertinent positive and negative review of systems were noted in the above HPI section. All other review of systems were otherwise negative.  Current Medications, Allergies, Past Medical History, Past Surgical History, Family History and Social History were reviewed in [facility] record  Vital signs were reviewed in today's medical record.  Physical Exam:  General: Well developed , well nourished, no acute distress  Skin: anicteric  Head: Normocephalic and atraumatic  Eyes: sclerae anicteric, EOMI  Ears: Normal auditory acuity  Mouth: No deformity or lesions  Neck: There is no lymphadenopathy. There is fullness and swelling in the area of the suprasternal notch but no fixed mass  Neurological: Alert oriented x 4, grossly nonfocal  Psychological: Alert and cooperative. Normal mood and affect  See Assessment and Plan under Problem List       Dysphagia, unspecified(787.20) - Inda Castle, MD at 01/03/2014 9:12 AM     Status: Written Related Problem: Dysphagia, unspecified(787.20)    Previous dysphagia was probably due to a distal early esophageal stricture. What she describes now is dysphagia specifically in the area  of swelling in the suprasternal notch. Dysphagia could be do to extrinsic compression of the cervical esophagus.   Imp - Dysphagia due to a distal esophageal stricture and possibly due to a prominent cricopharyngeal bar.  Plan Savary dilatation

## 2014-03-03 NOTE — Discharge Instructions (Signed)
Resume Coumadin today Esophageal Stricture The esophagus is the long, narrow tube which carries food and liquid from the mouth to the stomach. Sometimes a part of the esophagus becomes narrow and makes it difficult, painful, or even impossible to swallow. This is called an esophageal stricture.  CAUSES  Common causes of blockage or strictures of the esophagus are:  Exposure of the lower esophagus to the acid from the stomach may cause narrowing.  Hiatal hernia in which a small part of the stomach bulges up through the diaphragm can cause a narrowing in the bottom of the esophagus.  Scleroderma is a tissue disorder that affects the esophagus and makes swallowing difficult.  Achalasia is an absence of nerves in the lower esophagus and to the esophageal sphincter. This absence of nerves may be congenital (present since birth). This can cause irregular spasms which do not allow food and fluid through.  Strictures may develop from swallowing materials which damage the esophagus. Examples are acids or alkalis such as lye.  Schatzki's Ring is a narrow ring of non-cancerous tissue which narrows the lower esophagus. The cause of this is unknown.  Growths can block the esophagus. SYMPTOMS  Some of the problems are difficulty swallowing or pain with swallowing. DIAGNOSIS  Your caregiver often suspects this problem by taking a medical history. They will also do a physical exam. They may then take X-rays and/or perform an endoscopy. Endoscopy is an exam in which a tube like a small flexible telescope is used to look at your esophagus.  TREATMENT  One form of treatment is to dilate the narrow area. This means to stretch it.  When this is not successful, chest surgery may be required. This is a much more extensive form of treatment with a longer recovery time. Both of the above treatments make the passage of food and water into the stomach easier. They also make it easier for stomach contents to bubble  back into the esophagus. Special medications may be used following the procedure to help prevent further narrowing. Medications may be used to lower the amount of acid in the stomach juice.  SEEK IMMEDIATE MEDICAL CARE IF:   Your swallowing is becoming more painful, difficult, or you are unable to swallow.  You vomit up blood.  You develop black tarry stools.  You develop chills.  You have a fever.  You develop chest or abdominal pain.  You develop shortness of breath, feel lightheaded, or faint. Follow up with medical care as your caregiver suggests. Document Released: 08/01/2006 Document Revised: 02/13/2012 Document Reviewed: 09/07/2006 [facility] Patient Information 2014 [place], [place].  Upper Gastrointestinal Series Care After Refer to this sheet in the next few weeks. These instructions provide you with information on caring for yourself after your procedure. Your caregiver may also give you more specific instructions. Your treatment has been planned according to current medical practices, but problems sometimes occur. Call your caregiver if you have any problems or questions after your procedure. HOME CARE INSTRUCTIONS  You may go back to your normal diet and activities as soon as you feel able. You may feel tired or bloated for a few days after the test.  Drink enough fluids to keep your urine clear or pale yellow. This will help get the barium out of your system. Your stools may be white or light-colored for a few days. This is normal.  If you are constipated, ask your caregiver whether you should take a laxative. SEEK MEDICAL CARE IF:  You are constipated  for more than 3 days.  Your bowel movements still look white or chalky after 3 days.  You have cramps, pain, or diarrhea.  You feel nauseous or vomit. SEEK IMMEDIATE MEDICAL CARE IF:  You cannot pass gas.  You have very bad constipation.  You have abdominal pain that gets worse.  You develop red, itchy hives on  your skin.  Your throat swells.  You have trouble breathing.  You have a fever. MAKE SURE YOU:  Understand these instructions.  Will watch your condition.  Will get help right away if you are not doing well or get worse. Document Released: 11/10/2011 Document Revised: 05/22/2012 Document Reviewed: 11/10/2011 [facility] Patient Information 2014 [place], [place].

## 2014-03-03 NOTE — Anesthesia Preprocedure Evaluation (Addendum)
Anesthesia Evaluation  Patient identified by MRN, date of birth, ID band Patient awake    Reviewed: Allergy & Precautions, H&P , NPO status , Patient's Chart, lab work & pertinent test results  Airway Mallampati: II TM Distance: >3 FB Neck ROM: Full    Dental  (+) Teeth Intact, Dental Advisory Given   Pulmonary pneumonia -, COPD oxygen dependent, former smoker,  Recent exacerbation of pulmonary/COPD.    + decreased breath sounds      Cardiovascular hypertension, Pt. on medications Rhythm:Regular Rate:Normal     Neuro/Psych Depression TIACVA    GI/Hepatic negative GI ROS, Neg liver ROS, GERD-  Medicated,  Endo/Other  negative endocrine ROS  Renal/GU Renal disease  negative genitourinary   Musculoskeletal negative musculoskeletal ROS (+)   Abdominal   Peds  Hematology negative hematology ROS (+)   Anesthesia Other Findings   Reproductive/Obstetrics                        Anesthesia Physical Anesthesia Plan  ASA: III  Anesthesia Plan: MAC   Post-op Pain Management:    Induction: Intravenous  Airway Management Planned: Nasal Cannula  Additional Equipment:   Intra-op Plan:   Post-operative Plan:   Informed Consent: I have reviewed the patients History and Physical, chart, labs and discussed the procedure including the risks, benefits and alternatives for the proposed anesthesia with the patient or authorized representative who has indicated his/her understanding and acceptance.   Dental advisory given  Plan Discussed with: CRNA  Anesthesia Plan Comments:         Anesthesia Quick Evaluation

## 2014-03-03 NOTE — Anesthesia Postprocedure Evaluation (Signed)
Anesthesia Post Note  Patient: [name]  Procedure(s) Performed: Procedure(s) (LRB): ESOPHAGOGASTRODUODENOSCOPY (EGD) (N/A) BALLOON DILATION (N/A)  Anesthesia type: MAC  Patient location: PACU  Post pain: Pain level controlled  Post assessment: Post-op Vital signs reviewed  Last Vitals:  Filed Vitals:   03/03/14 1125  BP: 144/79  Pulse: 77  Temp: 36.8 C  Resp: 21    Post vital signs: Reviewed  Level of consciousness: sedated  Complications: No apparent anesthesia complications

## 2014-03-04 ENCOUNTER — Encounter (HOSPITAL_COMMUNITY): Payer: Self-pay | Admitting: Gastroenterology

## 2014-03-11 ENCOUNTER — Ambulatory Visit (INDEPENDENT_AMBULATORY_CARE_PROVIDER_SITE_OTHER): Payer: Medicare Other | Admitting: General Practice

## 2014-03-11 ENCOUNTER — Encounter: Payer: Self-pay | Admitting: Critical Care Medicine

## 2014-03-11 ENCOUNTER — Ambulatory Visit (INDEPENDENT_AMBULATORY_CARE_PROVIDER_SITE_OTHER): Payer: Medicare Other | Admitting: Critical Care Medicine

## 2014-03-11 VITALS — BP 122/66 | HR 58 | Temp 98.5°F | Ht 63.0 in | Wt 156.0 lb

## 2014-03-11 DIAGNOSIS — Z5181 Encounter for therapeutic drug level monitoring: Secondary | ICD-10-CM | POA: Diagnosis not present

## 2014-03-11 DIAGNOSIS — J449 Chronic obstructive pulmonary disease, unspecified: Secondary | ICD-10-CM | POA: Diagnosis not present

## 2014-03-11 DIAGNOSIS — Z8679 Personal history of other diseases of the circulatory system: Secondary | ICD-10-CM | POA: Diagnosis not present

## 2014-03-11 DIAGNOSIS — G459 Transient cerebral ischemic attack, unspecified: Secondary | ICD-10-CM | POA: Diagnosis not present

## 2014-03-11 DIAGNOSIS — Z7901 Long term (current) use of anticoagulants: Secondary | ICD-10-CM

## 2014-03-11 LAB — POCT INR: INR: 2.2

## 2014-03-11 NOTE — Progress Notes (Signed)
Pre visit review using our clinic review tool, if applicable. No additional management support is needed unless otherwise documented below in the visit note. 

## 2014-03-11 NOTE — Progress Notes (Signed)
Subjective:    Patient ID: [name], female    DOB: [birth year], 73 y.o.   MRN: [id number]  HPI  73 y.o. female with known of COPD   03/11/2014 Chief Complaint  Patient presents with  . 2 month follow up    Has esophagus stretched x 1 wk ago. Breathing has improved with this.  coughing some last night - nonprod.  No chest tightness/pain.  Pt now walking more. If go 1/2 mile sats down if up a hill.  Now can go a mile and stays at 90 -91.      Less dyspnea, less cough.    Review of Systems  Constitutional:   No  weight loss, night sweats,  Fevers, chills, fatigue, or  lassitude.  HEENT:   No headaches,  Difficulty swallowing,  Tooth/dental problems, or  Sore throat,                No sneezing, itching, ear ache, nasal congestion, post nasal drip,   CV:  No chest pain,  Orthopnea, PND, swelling in lower extremities, anasarca, dizziness, palpitations, syncope.   GI  No heartburn, indigestion, abdominal pain, nausea, vomiting, diarrhea, change in bowel habits, loss of appetite, bloody stools.   Resp:    No chest wall deformity  Skin: no rash or lesions.  GU: no dysuria, change in color of urine, no urgency or frequency.  No flank pain, no hematuria   MS:  No joint pain or swelling.  No decreased range of motion.  No back pain.  Psych:  No change in mood or affect. No depression or anxiety.  No memory loss.     Objective:   Physical Exam BP 122/66  Pulse 58  Temp(Src) 98.5 F (36.9 C) (Oral)  Ht 5\' 3"  (1.6 m)  Wt 156 lb (70.761 kg)  BMI 27.64 kg/m2  SpO2 94%  GEN: A/Ox3; pleasant , NAD, well nourished   HEENT:  [place]/AT,  EACs-clear, TMs-wnl, NOSE-clear, THROAT-clear, no lesions, no postnasal drip or exudate noted.   NECK:  Supple w/ fair ROM; no JVD; normal carotid impulses w/o bruits; no thyromegaly or nodules palpated; no lymphadenopathy.  RESP  distant breath sounds.no accessory muscle use, no dullness to percussion  CARD:  RRR, no m/r/g  , no peripheral edema,  pulses intact, no cyanosis or clubbing., Ted hose   GI:   Soft & nt; nml bowel sounds; no organomegaly or masses detected.  Musco: Warm bil, no deformities or joint swelling noted.   Neuro: alert, no focal deficits noted.    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 COPD with recurrent exacerbations secondary to microaspiration from esophageal stricture now improved Plan Maintain current inhaled medications Return 4 months    Updated Medication List Outpatient Encounter Prescriptions as of 03/11/2014  Medication Sig  . albuterol (PROVENTIL HFA;VENTOLIN HFA) 108 (90 BASE) MCG/ACT inhaler Inhale 1-2 puffs into the lungs every 6 (six) hours as needed for wheezing or shortness of breath.  Marland Kitchen amLODipine (NORVASC) 5 MG tablet Take 5 mg by mouth every morning.  Marland Kitchen azelastine (ASTELIN) 137 MCG/SPRAY nasal spray Place 2 sprays into the nose daily. Use in each nostril as directed  . benzonatate (TESSALON) 100 MG capsule Take 100 mg by mouth 3 (three) times daily as needed for cough.   . budesonide-formoterol (SYMBICORT) 160-4.5 MCG/ACT inhaler Inhale 2 puffs into the lungs 2 (two)  times daily.  . Calcium Carbonate-Vitamin D (CALCIUM + D PO) Take 1 tablet by mouth 2 (two) times daily.   . citalopram (CELEXA) 10 MG tablet Take 10 mg by mouth every morning.  . diphenhydramine-acetaminophen (TYLENOL PM) 25-500 MG TABS Take 1 tablet by mouth at bedtime as needed (sleep).  . gabapentin (NEURONTIN) 300 MG capsule 2 tabs by mouth three times daily as needed for pain  . irbesartan (AVAPRO) 150 MG tablet Take 150 mg by mouth every morning.  . Melatonin 1 MG TABS Take by mouth at bedtime as needed.  . meloxicam (MOBIC) 15 MG tablet Take 1 tablet (15 mg total) by mouth daily as needed for pain.  . montelukast (SINGULAIR) 10 MG tablet Take 1 tablet (10 mg total) by mouth daily.  . Multiple Vitamin (MULTIVITAMIN)  tablet Take 1 tablet by mouth daily.    Marland Kitchen omeprazole (PRILOSEC) 20 MG capsule Take 1 capsule (20 mg total) by mouth daily.  . Vitamin Mixture (VITAMIN E COMPLETE PO) Take 1 capsule by mouth daily.  Marland Kitchen warfarin (COUMADIN) 5 MG tablet Take 2.5-5 mg by mouth daily at 6 PM. 5mg  on Monday, Wednesday and Friday.  All others days 2.5 mg  . [DISCONTINUED] estradiol (CLIMARA - DOSED IN MG/24 HR) 0.05 mg/24hr patch Place 0.05 mg onto the skin every Friday.

## 2014-03-11 NOTE — Patient Instructions (Signed)
No change in medications Return 4 months 

## 2014-03-13 NOTE — Assessment & Plan Note (Signed)
Gold stage B. COPD with recurrent exacerbations secondary to microaspiration from esophageal stricture now improved Plan Maintain current inhaled medications Return 4 months

## 2014-04-08 ENCOUNTER — Ambulatory Visit (INDEPENDENT_AMBULATORY_CARE_PROVIDER_SITE_OTHER): Payer: Medicare Other | Admitting: General Practice

## 2014-04-08 DIAGNOSIS — Z5181 Encounter for therapeutic drug level monitoring: Secondary | ICD-10-CM

## 2014-04-08 DIAGNOSIS — J449 Chronic obstructive pulmonary disease, unspecified: Secondary | ICD-10-CM | POA: Diagnosis not present

## 2014-04-08 DIAGNOSIS — Z7901 Long term (current) use of anticoagulants: Secondary | ICD-10-CM

## 2014-04-08 DIAGNOSIS — G459 Transient cerebral ischemic attack, unspecified: Secondary | ICD-10-CM

## 2014-04-08 DIAGNOSIS — Z5189 Encounter for other specified aftercare: Secondary | ICD-10-CM | POA: Diagnosis not present

## 2014-04-08 DIAGNOSIS — Z8679 Personal history of other diseases of the circulatory system: Secondary | ICD-10-CM

## 2014-04-08 LAB — POCT INR: INR: 1.9

## 2014-04-08 NOTE — Progress Notes (Signed)
Pre visit review using our clinic review tool, if applicable. No additional management support is needed unless otherwise documented below in the visit note. 

## 2014-04-16 DIAGNOSIS — Z5189 Encounter for other specified aftercare: Secondary | ICD-10-CM | POA: Diagnosis not present

## 2014-04-16 DIAGNOSIS — J449 Chronic obstructive pulmonary disease, unspecified: Secondary | ICD-10-CM | POA: Diagnosis not present

## 2014-04-17 ENCOUNTER — Encounter: Payer: Self-pay | Admitting: Gastroenterology

## 2014-04-17 ENCOUNTER — Other Ambulatory Visit (INDEPENDENT_AMBULATORY_CARE_PROVIDER_SITE_OTHER): Payer: Medicare Other

## 2014-04-17 ENCOUNTER — Ambulatory Visit (INDEPENDENT_AMBULATORY_CARE_PROVIDER_SITE_OTHER): Payer: Medicare Other | Admitting: Gastroenterology

## 2014-04-17 VITALS — BP 120/70 | HR 68 | Ht 63.0 in | Wt 151.4 lb

## 2014-04-17 DIAGNOSIS — K222 Esophageal obstruction: Secondary | ICD-10-CM

## 2014-04-17 DIAGNOSIS — R1310 Dysphagia, unspecified: Secondary | ICD-10-CM

## 2014-04-17 DIAGNOSIS — R221 Localized swelling, mass and lump, neck: Secondary | ICD-10-CM

## 2014-04-17 DIAGNOSIS — R5381 Other malaise: Secondary | ICD-10-CM | POA: Diagnosis not present

## 2014-04-17 DIAGNOSIS — R220 Localized swelling, mass and lump, head: Secondary | ICD-10-CM

## 2014-04-17 DIAGNOSIS — R5383 Other fatigue: Secondary | ICD-10-CM

## 2014-04-17 DIAGNOSIS — R197 Diarrhea, unspecified: Secondary | ICD-10-CM

## 2014-04-17 LAB — TSH: TSH: 2.43 u[IU]/mL (ref 0.35–4.50)

## 2014-04-17 NOTE — Progress Notes (Signed)
          History of Present Illness:  The patient has returned following upper endoscopy where a distal esophageal stricture was dilated with a Savary dilator to 18 mm.  She reports significant improvement in dysphagia although she still has some hangup of foods.  She complains of difficulty swallowing pills where there is a hangup in her neck.  She still complains of swelling in the area of the suprasternal notch.  She's also having immediate postprandial loose stools.  This is coincident with starting Celexa in December, 2014.    Review of Systems: Pertinent positive and negative review of systems were noted in the above HPI section. All other review of systems were otherwise negative.    Current Medications, Allergies, Past Medical History, Past Surgical History, Family History and Social History were reviewed in [place] record  Vital signs were reviewed in today's medical record. Physical Exam: General: Well developed , well nourished, no acute distress Neck: There is soft tissue prominence in the suprasternal notch.  No masses or thyromegaly is appreciated   See Assessment and Plan under Problem List

## 2014-04-17 NOTE — Assessment & Plan Note (Signed)
Patient is having postprandial diarrhea.  Symptoms are coincidental with starting Celexa which raises the question whether this is a drug effect.  Plan to begin Imodium 2 tabs every morning.  For now I think it would be advantageous not to change her antidepressant.

## 2014-04-17 NOTE — Patient Instructions (Signed)
Your Ultrasound is scheduled on 04/18/2014 at [facility] Go to the basement for labs today

## 2014-04-17 NOTE — Assessment & Plan Note (Signed)
Improved following Savary dilation to 18 mm although she still has some symptoms.  We discussed repeat dilatation and have decided that we will hold off further therapy unless symptoms of dysphagia worsen.

## 2014-04-17 NOTE — Assessment & Plan Note (Signed)
Patient has some difficulty swallowing pills in the area of her cervical esophagus which she believes is 2 to the swelling in the suprasternal notch.  Esophagram demonstrated a prominent cricopharyngeal muscle.  I have some concern that the swelling in the suprasternal notch may be impacting on the esophagus.  I do not appreciate an enlarged thyroid, per se.  Recommendations #1 check thyroid function test #2 neck ultrasound

## 2014-04-18 ENCOUNTER — Ambulatory Visit (HOSPITAL_COMMUNITY)
Admission: RE | Admit: 2014-04-18 | Discharge: 2014-04-18 | Disposition: A | Discharge status: Home / Self Care | Payer: Medicare Other | Source: Ambulatory Visit | Attending: Gastroenterology | Admitting: Gastroenterology

## 2014-04-18 DIAGNOSIS — E042 Nontoxic multinodular goiter: Secondary | ICD-10-CM | POA: Insufficient documentation

## 2014-04-18 DIAGNOSIS — R221 Localized swelling, mass and lump, neck: Secondary | ICD-10-CM

## 2014-04-18 DIAGNOSIS — R220 Localized swelling, mass and lump, head: Secondary | ICD-10-CM

## 2014-04-18 DIAGNOSIS — R5383 Other fatigue: Secondary | ICD-10-CM

## 2014-04-18 LAB — T4: T4, Total: 10.5 ug/dL (ref 5.0–12.5)

## 2014-04-21 DIAGNOSIS — Z5189 Encounter for other specified aftercare: Secondary | ICD-10-CM | POA: Diagnosis not present

## 2014-04-21 DIAGNOSIS — J449 Chronic obstructive pulmonary disease, unspecified: Secondary | ICD-10-CM | POA: Diagnosis not present

## 2014-04-22 ENCOUNTER — Encounter: Payer: Self-pay | Admitting: Gastroenterology

## 2014-04-25 DIAGNOSIS — J449 Chronic obstructive pulmonary disease, unspecified: Secondary | ICD-10-CM | POA: Diagnosis not present

## 2014-04-25 DIAGNOSIS — Z5189 Encounter for other specified aftercare: Secondary | ICD-10-CM | POA: Diagnosis not present

## 2014-04-30 DIAGNOSIS — J449 Chronic obstructive pulmonary disease, unspecified: Secondary | ICD-10-CM | POA: Diagnosis not present

## 2014-04-30 DIAGNOSIS — Z5189 Encounter for other specified aftercare: Secondary | ICD-10-CM | POA: Diagnosis not present

## 2014-05-02 DIAGNOSIS — J449 Chronic obstructive pulmonary disease, unspecified: Secondary | ICD-10-CM | POA: Diagnosis not present

## 2014-05-02 DIAGNOSIS — Z5189 Encounter for other specified aftercare: Secondary | ICD-10-CM | POA: Diagnosis not present

## 2014-05-05 DIAGNOSIS — Z5189 Encounter for other specified aftercare: Secondary | ICD-10-CM | POA: Diagnosis not present

## 2014-05-05 DIAGNOSIS — J449 Chronic obstructive pulmonary disease, unspecified: Secondary | ICD-10-CM | POA: Diagnosis not present

## 2014-05-06 ENCOUNTER — Ambulatory Visit (INDEPENDENT_AMBULATORY_CARE_PROVIDER_SITE_OTHER): Payer: Medicare Other | Admitting: General Practice

## 2014-05-06 DIAGNOSIS — Z5181 Encounter for therapeutic drug level monitoring: Secondary | ICD-10-CM

## 2014-05-06 DIAGNOSIS — Z7901 Long term (current) use of anticoagulants: Secondary | ICD-10-CM | POA: Diagnosis not present

## 2014-05-06 DIAGNOSIS — G459 Transient cerebral ischemic attack, unspecified: Secondary | ICD-10-CM | POA: Diagnosis not present

## 2014-05-06 DIAGNOSIS — Z8679 Personal history of other diseases of the circulatory system: Secondary | ICD-10-CM

## 2014-05-06 LAB — POCT INR: INR: 2.1

## 2014-05-06 NOTE — Progress Notes (Signed)
Pre visit review using our clinic review tool, if applicable. No additional management support is needed unless otherwise documented below in the visit note. 

## 2014-05-07 DIAGNOSIS — J449 Chronic obstructive pulmonary disease, unspecified: Secondary | ICD-10-CM | POA: Diagnosis not present

## 2014-05-07 DIAGNOSIS — Z5189 Encounter for other specified aftercare: Secondary | ICD-10-CM | POA: Diagnosis not present

## 2014-05-09 DIAGNOSIS — Z5189 Encounter for other specified aftercare: Secondary | ICD-10-CM | POA: Diagnosis not present

## 2014-05-09 DIAGNOSIS — J449 Chronic obstructive pulmonary disease, unspecified: Secondary | ICD-10-CM | POA: Diagnosis not present

## 2014-05-12 DIAGNOSIS — J449 Chronic obstructive pulmonary disease, unspecified: Secondary | ICD-10-CM | POA: Diagnosis not present

## 2014-05-12 DIAGNOSIS — Z5189 Encounter for other specified aftercare: Secondary | ICD-10-CM | POA: Diagnosis not present

## 2014-05-16 DIAGNOSIS — J449 Chronic obstructive pulmonary disease, unspecified: Secondary | ICD-10-CM | POA: Diagnosis not present

## 2014-05-16 DIAGNOSIS — Z5189 Encounter for other specified aftercare: Secondary | ICD-10-CM | POA: Diagnosis not present

## 2014-05-19 DIAGNOSIS — Z5189 Encounter for other specified aftercare: Secondary | ICD-10-CM | POA: Diagnosis not present

## 2014-05-19 DIAGNOSIS — J449 Chronic obstructive pulmonary disease, unspecified: Secondary | ICD-10-CM | POA: Diagnosis not present

## 2014-05-21 DIAGNOSIS — Z5189 Encounter for other specified aftercare: Secondary | ICD-10-CM | POA: Diagnosis not present

## 2014-05-21 DIAGNOSIS — J449 Chronic obstructive pulmonary disease, unspecified: Secondary | ICD-10-CM | POA: Diagnosis not present

## 2014-05-30 DIAGNOSIS — J449 Chronic obstructive pulmonary disease, unspecified: Secondary | ICD-10-CM | POA: Diagnosis not present

## 2014-05-30 DIAGNOSIS — Z5189 Encounter for other specified aftercare: Secondary | ICD-10-CM | POA: Diagnosis not present

## 2014-06-02 DIAGNOSIS — Z5189 Encounter for other specified aftercare: Secondary | ICD-10-CM | POA: Diagnosis not present

## 2014-06-02 DIAGNOSIS — J449 Chronic obstructive pulmonary disease, unspecified: Secondary | ICD-10-CM | POA: Diagnosis not present

## 2014-06-03 ENCOUNTER — Ambulatory Visit (INDEPENDENT_AMBULATORY_CARE_PROVIDER_SITE_OTHER): Payer: Medicare Other | Admitting: General Practice

## 2014-06-03 DIAGNOSIS — G459 Transient cerebral ischemic attack, unspecified: Secondary | ICD-10-CM | POA: Diagnosis not present

## 2014-06-03 DIAGNOSIS — Z8679 Personal history of other diseases of the circulatory system: Secondary | ICD-10-CM

## 2014-06-03 DIAGNOSIS — Z7901 Long term (current) use of anticoagulants: Secondary | ICD-10-CM

## 2014-06-03 DIAGNOSIS — Z5181 Encounter for therapeutic drug level monitoring: Secondary | ICD-10-CM | POA: Diagnosis not present

## 2014-06-03 LAB — POCT INR: INR: 2.1

## 2014-06-03 NOTE — Progress Notes (Signed)
Pre visit review using our clinic review tool, if applicable. No additional management support is needed unless otherwise documented below in the visit note. 

## 2014-06-04 DIAGNOSIS — Z5189 Encounter for other specified aftercare: Secondary | ICD-10-CM | POA: Diagnosis not present

## 2014-06-04 DIAGNOSIS — J449 Chronic obstructive pulmonary disease, unspecified: Secondary | ICD-10-CM | POA: Diagnosis not present

## 2014-06-09 DIAGNOSIS — Z5189 Encounter for other specified aftercare: Secondary | ICD-10-CM | POA: Diagnosis not present

## 2014-06-09 DIAGNOSIS — J449 Chronic obstructive pulmonary disease, unspecified: Secondary | ICD-10-CM | POA: Diagnosis not present

## 2014-06-11 DIAGNOSIS — J449 Chronic obstructive pulmonary disease, unspecified: Secondary | ICD-10-CM | POA: Diagnosis not present

## 2014-06-11 DIAGNOSIS — Z5189 Encounter for other specified aftercare: Secondary | ICD-10-CM | POA: Diagnosis not present

## 2014-06-13 DIAGNOSIS — Z5189 Encounter for other specified aftercare: Secondary | ICD-10-CM | POA: Diagnosis not present

## 2014-06-13 DIAGNOSIS — J449 Chronic obstructive pulmonary disease, unspecified: Secondary | ICD-10-CM | POA: Diagnosis not present

## 2014-06-18 DIAGNOSIS — Z5189 Encounter for other specified aftercare: Secondary | ICD-10-CM | POA: Diagnosis not present

## 2014-06-18 DIAGNOSIS — J449 Chronic obstructive pulmonary disease, unspecified: Secondary | ICD-10-CM | POA: Diagnosis not present

## 2014-06-24 ENCOUNTER — Encounter: Payer: Self-pay | Admitting: Internal Medicine

## 2014-06-24 MED ORDER — LIDOCAINE HCL 2 % EX GEL: 1.0000 "application " | CUTANEOUS | Status: DC | PRN

## 2014-06-24 NOTE — Addendum Note (Signed)
Addended by: Biagio Borg on: 06/24/2014 07:18 PM   Modules accepted: Orders

## 2014-07-01 ENCOUNTER — Ambulatory Visit (INDEPENDENT_AMBULATORY_CARE_PROVIDER_SITE_OTHER): Payer: Medicare Other | Admitting: General Practice

## 2014-07-01 DIAGNOSIS — G459 Transient cerebral ischemic attack, unspecified: Secondary | ICD-10-CM

## 2014-07-01 DIAGNOSIS — Z5181 Encounter for therapeutic drug level monitoring: Secondary | ICD-10-CM

## 2014-07-01 DIAGNOSIS — Z8679 Personal history of other diseases of the circulatory system: Secondary | ICD-10-CM | POA: Diagnosis not present

## 2014-07-01 DIAGNOSIS — Z7901 Long term (current) use of anticoagulants: Secondary | ICD-10-CM

## 2014-07-01 LAB — POCT INR: INR: 2.9

## 2014-07-01 NOTE — Progress Notes (Signed)
Pre visit review using our clinic review tool, if applicable. No additional management support is needed unless otherwise documented below in the visit note. 

## 2014-07-15 ENCOUNTER — Encounter: Payer: Self-pay | Admitting: Critical Care Medicine

## 2014-07-15 DIAGNOSIS — J449 Chronic obstructive pulmonary disease, unspecified: Secondary | ICD-10-CM

## 2014-07-15 NOTE — Telephone Encounter (Signed)
Please advise on E-mail Dr Joya Gaskins.  Pt is requesting a repeat ONO.  Thanks.

## 2014-07-21 NOTE — Telephone Encounter (Signed)
Order placed for ONO.

## 2014-07-29 ENCOUNTER — Other Ambulatory Visit: Payer: Self-pay

## 2014-07-29 DIAGNOSIS — Z1231 Encounter for screening mammogram for malignant neoplasm of breast: Secondary | ICD-10-CM

## 2014-08-01 ENCOUNTER — Telehealth: Payer: Self-pay | Admitting: Critical Care Medicine

## 2014-08-01 DIAGNOSIS — J449 Chronic obstructive pulmonary disease, unspecified: Secondary | ICD-10-CM

## 2014-08-01 NOTE — Telephone Encounter (Signed)
Order placed to have o2 d/c'd from pt's home.  Pt aware and is aware DME will contact her to arrange this.

## 2014-08-01 NOTE — Telephone Encounter (Signed)
ONO was normal. I spoke to the pt Please d/c oxygen from home

## 2014-08-05 DIAGNOSIS — M999 Biomechanical lesion, unspecified: Secondary | ICD-10-CM | POA: Diagnosis not present

## 2014-08-12 ENCOUNTER — Ambulatory Visit (INDEPENDENT_AMBULATORY_CARE_PROVIDER_SITE_OTHER): Payer: Medicare Other | Admitting: *Deleted

## 2014-08-12 ENCOUNTER — Encounter: Payer: Self-pay | Admitting: Critical Care Medicine

## 2014-08-12 ENCOUNTER — Ambulatory Visit (INDEPENDENT_AMBULATORY_CARE_PROVIDER_SITE_OTHER): Payer: Medicare Other | Admitting: Critical Care Medicine

## 2014-08-12 VITALS — BP 120/82 | HR 82 | Temp 97.9°F | Ht 63.0 in | Wt 151.4 lb

## 2014-08-12 DIAGNOSIS — Z8679 Personal history of other diseases of the circulatory system: Secondary | ICD-10-CM | POA: Diagnosis not present

## 2014-08-12 DIAGNOSIS — J449 Chronic obstructive pulmonary disease, unspecified: Secondary | ICD-10-CM | POA: Diagnosis not present

## 2014-08-12 DIAGNOSIS — G459 Transient cerebral ischemic attack, unspecified: Secondary | ICD-10-CM | POA: Diagnosis not present

## 2014-08-12 DIAGNOSIS — Z7901 Long term (current) use of anticoagulants: Secondary | ICD-10-CM | POA: Diagnosis not present

## 2014-08-12 DIAGNOSIS — Z5181 Encounter for therapeutic drug level monitoring: Secondary | ICD-10-CM

## 2014-08-12 LAB — POCT INR: INR: 2.9

## 2014-08-12 NOTE — Assessment & Plan Note (Signed)
Copd Gold B, GERD ppt factor, ex smoker.  Improved on current program.   Recent ONO improved S/p pulm rehab and did well Plan Stay on symbicort  Oxygen discontinued Flu vaccine was given Refux diet given Stay on PPI

## 2014-08-12 NOTE — Patient Instructions (Addendum)
No change in medications A Flu vaccine was given Return 4 months

## 2014-08-12 NOTE — Progress Notes (Signed)
Subjective:    Patient ID: [name], female    DOB: June 26, 1941, 73 y.o.   MRN: [id number]  HPI  73 y.o. female with known of COPD   08/12/2014 Chief Complaint  Patient presents with  . Follow-up    Doing well overall-forgot morning med. for reflux yesterday (Prilosec),felt better after 2 hrs.of taking med.,sob with increased sob, no wheezing,cough x 1 day,-dry  Pt forgot reflux med, then started coughing., lasted a few hours.  Now back on the med.  Pt noted some burning in throat.  Today no real cough.  No wheezing.  Dyspnea well.  Passed ONO.  Now finished pulm rehab and helped.   Review of Systems  Constitutional:   No  weight loss, night sweats,  Fevers, chills, fatigue, or  lassitude.  HEENT:   No headaches,  Difficulty swallowing,  Tooth/dental problems, or  Sore throat,                No sneezing, itching, ear ache, nasal congestion, post nasal drip,   CV:  No chest pain,  Orthopnea, PND, swelling in lower extremities, anasarca, dizziness, palpitations, syncope.   GI  No heartburn, indigestion, abdominal pain, nausea, vomiting, diarrhea, change in bowel habits, loss of appetite, bloody stools.   Resp:    No chest wall deformity  Skin: no rash or lesions.  GU: no dysuria, change in color of urine, no urgency or frequency.  No flank pain, no hematuria   MS:  No joint pain or swelling.  No decreased range of motion.  No back pain.  Psych:  No change in mood or affect. No depression or anxiety.  No memory loss.     Objective:   Physical Exam BP 120/82  Pulse 82  Temp(Src) 97.9 F (36.6 C) (Oral)  Ht 5\' 3"  (1.6 m)  Wt 151 lb 6.4 oz (68.675 kg)  BMI 26.83 kg/m2  SpO2 95%  GEN: A/Ox3; pleasant , NAD, well nourished   HEENT:  [place]/AT,  EACs-clear, TMs-wnl, NOSE-clear, THROAT-clear, no lesions, no postnasal drip or exudate noted.   NECK:  Supple w/ fair ROM; no JVD; normal carotid impulses w/o bruits; no thyromegaly or nodules palpated; no lymphadenopathy.  RESP   distant breath sounds.no accessory muscle use, no dullness to percussion  CARD:  RRR, no m/r/g  , no peripheral edema, pulses intact, no cyanosis or clubbing., Ted hose   GI:   Soft & nt; nml bowel sounds; no organomegaly or masses detected.  Musco: Warm bil, no deformities or joint swelling noted.   Neuro: alert, no focal deficits noted.    Skin: Warm, no lesions or rashes     Assessment & Plan:   Obstructive chronic bronchitis without exacerbation gold stage B Copd Gold B, GERD ppt factor, ex smoker.  Improved on current program.   Recent ONO improved S/p pulm rehab and did well Plan Stay on symbicort  Oxygen discontinued Flu vaccine was given Refux diet given Stay on PPI    Updated Medication List Outpatient Encounter Prescriptions as of 08/12/2014  Medication Sig  . albuterol (PROVENTIL HFA;VENTOLIN HFA) 108 (90 BASE) MCG/ACT inhaler Inhale 1-2 puffs into the lungs every 6 (six) hours as needed for wheezing or shortness of breath.  Marland Kitchen amLODipine (NORVASC) 5 MG tablet Take 5 mg by mouth every morning.  Marland Kitchen azelastine (ASTELIN) 137 MCG/SPRAY nasal spray Place 2 sprays into the nose daily. Use in each nostril as directed  . benzonatate (TESSALON) 100 MG capsule Take 100  mg by mouth 3 (three) times daily as needed for cough.   . budesonide-formoterol (SYMBICORT) 160-4.5 MCG/ACT inhaler Inhale 2 puffs into the lungs 2 (two) times daily.  . Calcium Carbonate-Vitamin D (CALCIUM + D PO) Take 1 tablet by mouth 2 (two) times daily.   . citalopram (CELEXA) 10 MG tablet Take 10 mg by mouth every morning.  . diphenhydramine-acetaminophen (TYLENOL PM) 25-500 MG TABS Take 1 tablet by mouth at bedtime as needed (sleep).  . irbesartan (AVAPRO) 150 MG tablet Take 150 mg by mouth every morning.  . lidocaine (XYLOCAINE JELLY) 2 % jelly Apply 1 application topically as needed.  . Melatonin 1 MG TABS Take by mouth at bedtime as needed.  . meloxicam (MOBIC) 15 MG tablet Take 1 tablet (15 mg total)  by mouth daily as needed for pain.  . montelukast (SINGULAIR) 10 MG tablet Take 1 tablet (10 mg total) by mouth daily.  . Multiple Vitamin (MULTIVITAMIN) tablet Take 1 tablet by mouth daily.    Marland Kitchen omeprazole (PRILOSEC) 20 MG capsule Take 1 capsule (20 mg total) by mouth daily.  . Vitamin Mixture (VITAMIN E COMPLETE PO) Take 1 capsule by mouth daily.  Marland Kitchen warfarin (COUMADIN) 5 MG tablet Take 2.5-5 mg by mouth daily at 6 PM. 5mg  on Monday, Wednesday and Friday.  All others days 2.5 mg

## 2014-08-13 DIAGNOSIS — M999 Biomechanical lesion, unspecified: Secondary | ICD-10-CM | POA: Diagnosis not present

## 2014-08-18 DIAGNOSIS — M999 Biomechanical lesion, unspecified: Secondary | ICD-10-CM | POA: Diagnosis not present

## 2014-08-25 DIAGNOSIS — M999 Biomechanical lesion, unspecified: Secondary | ICD-10-CM | POA: Diagnosis not present

## 2014-08-26 ENCOUNTER — Ambulatory Visit: Payer: Medicare Other

## 2014-08-26 DIAGNOSIS — Z23 Encounter for immunization: Secondary | ICD-10-CM

## 2014-08-27 DIAGNOSIS — M999 Biomechanical lesion, unspecified: Secondary | ICD-10-CM | POA: Diagnosis not present

## 2014-08-27 NOTE — Progress Notes (Signed)
Per PW - flu shot to be given at no charge to patient.  Charges deleted in encounter.

## 2014-08-29 DIAGNOSIS — M999 Biomechanical lesion, unspecified: Secondary | ICD-10-CM | POA: Diagnosis not present

## 2014-09-09 ENCOUNTER — Ambulatory Visit
Admission: RE | Admit: 2014-09-09 | Discharge: 2014-09-09 | Disposition: A | Discharge status: Home / Self Care | Payer: Medicare Other | Source: Ambulatory Visit

## 2014-09-09 DIAGNOSIS — Z1231 Encounter for screening mammogram for malignant neoplasm of breast: Secondary | ICD-10-CM | POA: Diagnosis not present

## 2014-09-16 ENCOUNTER — Ambulatory Visit (INDEPENDENT_AMBULATORY_CARE_PROVIDER_SITE_OTHER): Payer: Medicare Other | Admitting: Critical Care Medicine

## 2014-09-16 ENCOUNTER — Encounter: Payer: Self-pay | Admitting: Critical Care Medicine

## 2014-09-16 VITALS — BP 122/60 | HR 80 | Temp 97.5°F | Ht 63.0 in | Wt 151.0 lb

## 2014-09-16 DIAGNOSIS — J441 Chronic obstructive pulmonary disease with (acute) exacerbation: Secondary | ICD-10-CM | POA: Diagnosis not present

## 2014-09-16 DIAGNOSIS — J471 Bronchiectasis with (acute) exacerbation: Secondary | ICD-10-CM

## 2014-09-16 MED ORDER — METHYLPREDNISOLONE ACETATE 80 MG/ML IJ SUSP
120.0000 mg | Freq: Once | INTRAMUSCULAR | Status: AC
  Administered 2014-09-16: 120 mg via INTRAMUSCULAR

## 2014-09-16 MED ORDER — CEFUROXIME AXETIL 500 MG PO TABS: 500.0000 mg | ORAL_TABLET | Freq: Two times a day (BID) | ORAL | Status: DC

## 2014-09-16 NOTE — Assessment & Plan Note (Signed)
Gold stage B. COPD with acute bronchitis Plan Ceftin one twice daily for 7days A depomedrol 120mg  injection was given No other changes Return 2 months

## 2014-09-16 NOTE — Progress Notes (Signed)
Subjective:    Patient ID: [name], female    DOB: 09/03/1941, 73 y.o.   MRN: [id number]  HPI  73 y.o. female with known of COPD   09/16/2014 Chief Complaint  Patient presents with  . Acute Visit    c/o cough-dry,occas. seen yellow,oxygen level when working out at home 89%,wheezing,sob increased x 1 1/2,hoarseness,no chest pain or tightness, no fcs  Notes mucus yellow , dry hacky cough, notes mild sinus drip.  No real chest pain.  sats fall to 89% with exertion at home.  Notes some wheezing. Notes sl increase in dyspnea.  Review of Systems  Constitutional:   No  weight loss, night sweats,  Fevers, chills, fatigue, or  lassitude.  HEENT:   No headaches,  Difficulty swallowing,  Tooth/dental problems, or  Sore throat,                No sneezing, itching, ear ache, nasal congestion, post nasal drip,   CV:  No chest pain,  Orthopnea, PND, swelling in lower extremities, anasarca, dizziness, palpitations, syncope.   GI  No heartburn, indigestion, abdominal pain, nausea, vomiting, diarrhea, change in bowel habits, loss of appetite, bloody stools.   Resp:    No chest wall deformity  Skin: no rash or lesions.  GU: no dysuria, change in color of urine, no urgency or frequency.  No flank pain, no hematuria   MS:  No joint pain or swelling.  No decreased range of motion.  No back pain.  Psych:  No change in mood or affect. No depression or anxiety.  No memory loss.     Objective:   Physical Exam BP 122/60  Pulse 80  Temp(Src) 97.5 F (36.4 C) (Oral)  Ht 5\' 3"  (1.6 m)  Wt 151 lb (68.493 kg)  BMI 26.76 kg/m2  SpO2 95%  GEN: A/Ox3; pleasant , NAD, well nourished   HEENT:  [place]/AT,  EACs-clear, TMs-wnl, NOSE-clear, THROAT-clear, no lesions, no postnasal drip or exudate noted.   NECK:  Supple w/ fair ROM; no JVD; normal carotid impulses w/o bruits; no thyromegaly or nodules palpated; no lymphadenopathy.  RESP  distant breath sounds.Expired wheezes with poor airflow  no  accessory muscle use, no dullness to percussion  CARD:  RRR, no m/r/g  , no peripheral edema, pulses intact, no cyanosis or clubbing., Ted hose   GI:   Soft & nt; nml bowel sounds; no organomegaly or masses detected.  Musco: Warm bil, no deformities or joint swelling noted.   Neuro: alert, no focal deficits noted.    Skin: Warm, no lesions or rashes     Assessment & Plan:   Gold B Copd with acute bronchitis Gold stage B. COPD with acute bronchitis Plan Ceftin one twice daily for 7days A depomedrol 120mg  injection was given No other changes Return 2 months     Updated Medication List Outpatient Encounter Prescriptions as of 09/16/2014  Medication Sig  . albuterol (PROVENTIL HFA;VENTOLIN HFA) 108 (90 BASE) MCG/ACT inhaler Inhale 1-2 puffs into the lungs every 6 (six) hours as needed for wheezing or shortness of breath.  Marland Kitchen amLODipine (NORVASC) 5 MG tablet Take 5 mg by mouth every morning.  Marland Kitchen azelastine (ASTELIN) 137 MCG/SPRAY nasal spray Place 2 sprays into the nose daily. Use in each nostril as directed  . benzonatate (TESSALON) 100 MG capsule Take 100 mg by mouth 3 (three) times daily as needed for cough.   . budesonide-formoterol (SYMBICORT) 160-4.5 MCG/ACT inhaler Inhale 2 puffs into the lungs  2 (two) times daily.  . Calcium Carbonate-Vitamin D (CALCIUM + D PO) Take 1 tablet by mouth 2 (two) times daily.   . citalopram (CELEXA) 10 MG tablet Take 10 mg by mouth every morning.  . diphenhydramine-acetaminophen (TYLENOL PM) 25-500 MG TABS Take 1 tablet by mouth at bedtime as needed (sleep).  . irbesartan (AVAPRO) 150 MG tablet Take 150 mg by mouth every morning.  . lidocaine (XYLOCAINE JELLY) 2 % jelly Apply 1 application topically as needed.  . Melatonin 1 MG TABS Take by mouth at bedtime as needed.  . meloxicam (MOBIC) 15 MG tablet Take 1 tablet (15 mg total) by mouth daily as needed for pain.  . montelukast (SINGULAIR) 10 MG tablet Take 1 tablet (10 mg total) by mouth daily.   . Multiple Vitamin (MULTIVITAMIN) tablet Take 1 tablet by mouth daily.    Marland Kitchen omeprazole (PRILOSEC) 20 MG capsule Take 1 capsule (20 mg total) by mouth daily.  . Vitamin Mixture (VITAMIN E COMPLETE PO) Take 1 capsule by mouth daily.  Marland Kitchen warfarin (COUMADIN) 5 MG tablet Take 2.5-5 mg by mouth daily at 6 PM. 5mg  on Monday, Wednesday and Friday.  All others days 2.5 mg  . cefUROXime (CEFTIN) 500 MG tablet Take 1 tablet (500 mg total) by mouth 2 (two) times daily.  . [EXPIRED] methylPREDNISolone acetate (DEPO-MEDROL) injection 120 mg

## 2014-09-16 NOTE — Patient Instructions (Signed)
Ceftin one twice daily for 7days A depomedrol 120mg  injection was given No other changes Return 2 months

## 2014-09-23 ENCOUNTER — Ambulatory Visit (INDEPENDENT_AMBULATORY_CARE_PROVIDER_SITE_OTHER): Payer: Medicare Other | Admitting: *Deleted

## 2014-09-23 ENCOUNTER — Encounter: Payer: Self-pay | Admitting: Gynecology

## 2014-09-23 ENCOUNTER — Ambulatory Visit (INDEPENDENT_AMBULATORY_CARE_PROVIDER_SITE_OTHER): Payer: Medicare Other | Admitting: Gynecology

## 2014-09-23 VITALS — BP 120/74 | Ht 63.0 in | Wt 151.0 lb

## 2014-09-23 DIAGNOSIS — Z7901 Long term (current) use of anticoagulants: Secondary | ICD-10-CM

## 2014-09-23 DIAGNOSIS — G459 Transient cerebral ischemic attack, unspecified: Secondary | ICD-10-CM | POA: Diagnosis not present

## 2014-09-23 DIAGNOSIS — N952 Postmenopausal atrophic vaginitis: Secondary | ICD-10-CM | POA: Diagnosis not present

## 2014-09-23 DIAGNOSIS — N8111 Cystocele, midline: Secondary | ICD-10-CM | POA: Diagnosis not present

## 2014-09-23 DIAGNOSIS — N3941 Urge incontinence: Secondary | ICD-10-CM

## 2014-09-23 DIAGNOSIS — Z79890 Hormone replacement therapy (postmenopausal): Secondary | ICD-10-CM

## 2014-09-23 DIAGNOSIS — N951 Menopausal and female climacteric states: Secondary | ICD-10-CM | POA: Diagnosis not present

## 2014-09-23 DIAGNOSIS — Z8679 Personal history of other diseases of the circulatory system: Secondary | ICD-10-CM

## 2014-09-23 LAB — POCT INR: INR: 2.5

## 2014-09-23 MED ORDER — ESTRADIOL 0.0375 MG/24HR TD PTWK: 0.0375 mg | MEDICATED_PATCH | TRANSDERMAL | Status: DC

## 2014-09-23 NOTE — Progress Notes (Signed)
[name] 02-08-1941 [id number]        73 y.o.  [id number] for follow up exam. Several issues that are below.  Past medical history,surgical history, problem list, medications, allergies, family history and social history were all reviewed and documented as reviewed in the EPIC chart.  ROS:  12 system ROS performed with pertinent positives and negatives included in the history, assessment and plan.   Additional significant findings :  none   Exam: Kim Counsellor Vitals:   09/23/14 1111  BP: 120/74  Height: 5\' 3"  (1.6 m)  Weight: 151 lb (68.493 kg)   General appearance:  Normal affect, orientation and appearance. Skin: Grossly normal HEENT: Without gross lesions.  No cervical or supraclavicular adenopathy. Thyroid normal.  Lungs:  Clear without wheezing, rales or rhonchi Cardiac: RR, without RMG Abdominal:  Soft, nontender, without masses, guarding, rebound, organomegaly or hernia Breasts:  Examined lying and sitting without masses, retractions, discharge or axillary adenopathy. Pelvic:  Ext/BUS/vagina generalized atrophic changes. Mild cystocele noted  Adnexa  Without masses or tenderness    Anus and perineum  Normal   Rectovaginal  Normal sphincter tone without palpated masses or tenderness.    Assessment/Plan:  73 y.o. [id number] female for follow up exam.   1. Postmenopausal/menopausal symptoms. Status post TVH, A&P repair.  Patient had been on estradiol 0.05 mg patches. She stopped them but is having except for hot flushes and night sweats multiple times daily. Per 09/12/2013 note we previously discussed the risks of HRT and the unusual nature of using it and a 73 year old with her medical issues. I again reviewed this all again today in the risks of stroke heart attack DVT breast cancer all reviewed. Patient feels this is a quality of life issue and really would like to reinitiate the patch. We'll start at a little lower dose with estradiol 0.0375 and see how she does with  this. One-year prescription written. Follow up if issues. 2. Urinary incontinence. Is having some mild leakage. Used OAB medication in the past but had unacceptable side effects to include dry mouth/constipation. Does not want to consider any medication or further evaluation for this she does not consider this a significant issue at this point.  Check urinalysis today. 3. Mild cystocele. Stable on exam. Asymptomatic to the patient. Continue to monitor. 4. Pap smear 2010. No Pap smear done today. No history of abnormal Pap smears previously. Over the age of 67. Status post hysterectomy for benign indications. We both agree to stop screening and she is comfortable with this. 5. Mammography 09/2014.  Continue with annual mammography. SBE monthly reviewed. 6. DEXA reported last year through her primary physician's office. Told that it was okay. Do not have a copy of these results. Will continue to follow up with them in reference to this. 7. Colonoscopy 2014. Repeat at their recommended interval. 8. Health maintenance. No routine blood work done as this is all done through her primary physician's office. Follow up one year, sooner as needed.       Anastasio Auerbach MD, 11:34 AM 09/23/2014

## 2014-09-23 NOTE — Patient Instructions (Signed)
You may obtain a copy of any labs that were done today by logging onto MyChart as outlined in the instructions provided with your AVS (after visit summary). The office will not call with normal lab results but certainly if there are any significant abnormalities then we will contact you.   Health Maintenance, Female A healthy lifestyle and preventative care can promote health and wellness.  Maintain regular health, dental, and eye exams.  Eat a healthy diet. Foods like vegetables, fruits, whole grains, low-fat dairy products, and lean protein foods contain the nutrients you need without too many calories. Decrease your intake of foods high in solid fats, added sugars, and salt. Get information about a proper diet from your caregiver, if necessary.  Regular physical exercise is one of the most important things you can do for your health. Most adults should get at least 150 minutes of moderate-intensity exercise (any activity that increases your heart rate and causes you to sweat) each week. In addition, most adults need muscle-strengthening exercises on 2 or more days a week.   Maintain a healthy weight. The body mass index (BMI) is a screening tool to identify possible weight problems. It provides an estimate of body fat based on height and weight. Your caregiver can help determine your BMI, and can help you achieve or maintain a healthy weight. For adults 20 years and older:  A BMI below 18.5 is considered underweight.  A BMI of 18.5 to 24.9 is normal.  A BMI of 25 to 29.9 is considered overweight.  A BMI of 30 and above is considered obese.  Maintain normal blood lipids and cholesterol by exercising and minimizing your intake of saturated fat. Eat a balanced diet with plenty of fruits and vegetables. Blood tests for lipids and cholesterol should begin at age 61 and be repeated every 5 years. If your lipid or cholesterol levels are high, you are over 50, or you are a high risk for heart  disease, you may need your cholesterol levels checked more frequently.Ongoing high lipid and cholesterol levels should be treated with medicines if diet and exercise are not effective.  If you smoke, find out from your caregiver how to quit. If you do not use tobacco, do not start.  Lung cancer screening is recommended for adults aged 33 80 years who are at high risk for developing lung cancer because of a history of smoking. Yearly low-dose computed tomography (CT) is recommended for people who have at least a 30-pack-year history of smoking and are a current smoker or have quit within the past 15 years. A pack year of smoking is smoking an average of 1 pack of cigarettes a day for 1 year (for example: 1 pack a day for 30 years or 2 packs a day for 15 years). Yearly screening should continue until the smoker has stopped smoking for at least 15 years. Yearly screening should also be stopped for people who develop a health problem that would prevent them from having lung cancer treatment.  If you are pregnant, do not drink alcohol. If you are breastfeeding, be very cautious about drinking alcohol. If you are not pregnant and choose to drink alcohol, do not exceed 1 drink per day. One drink is considered to be 12 ounces (355 mL) of beer, 5 ounces (148 mL) of wine, or 1.5 ounces (44 mL) of liquor.  Avoid use of street drugs. Do not share needles with anyone. Ask for help if you need support or instructions about stopping  the use of drugs.  High blood pressure causes heart disease and increases the risk of stroke. Blood pressure should be checked at least every 1 to 2 years. Ongoing high blood pressure should be treated with medicines, if weight loss and exercise are not effective.  If you are 59 to 75 years old, ask your caregiver if you should take aspirin to prevent strokes.  Diabetes screening involves taking a blood sample to check your fasting blood sugar level. This should be done once every 3  years, after age 91, if you are within normal weight and without risk factors for diabetes. Testing should be considered at a younger age or be carried out more frequently if you are overweight and have at least 1 risk factor for diabetes.  Breast cancer screening is essential preventative care for women. You should practice "breast self-awareness." This means understanding the normal appearance and feel of your breasts and may include breast self-examination. Any changes detected, no matter how small, should be reported to a caregiver. Women in their 66s and 30s should have a clinical breast exam (CBE) by a caregiver as part of a regular health exam every 1 to 3 years. After age 101, women should have a CBE every year. Starting at age 100, women should consider having a mammogram (breast X-ray) every year. Women who have a family history of breast cancer should talk to their caregiver about genetic screening. Women at a high risk of breast cancer should talk to their caregiver about having an MRI and a mammogram every year.  Breast cancer gene (BRCA)-related cancer risk assessment is recommended for women who have family members with BRCA-related cancers. BRCA-related cancers include breast, ovarian, tubal, and peritoneal cancers. Having family members with these cancers may be associated with an increased risk for harmful changes (mutations) in the breast cancer genes BRCA1 and BRCA2. Results of the assessment will determine the need for genetic counseling and BRCA1 and BRCA2 testing.  The Pap test is a screening test for cervical cancer. Women should have a Pap test starting at age 57. Between ages 25 and 35, Pap tests should be repeated every 2 years. Beginning at age 37, you should have a Pap test every 3 years as long as the past 3 Pap tests have been normal. If you had a hysterectomy for a problem that was not cancer or a condition that could lead to cancer, then you no longer need Pap tests. If you are  between ages 50 and 76, and you have had normal Pap tests going back 10 years, you no longer need Pap tests. If you have had past treatment for cervical cancer or a condition that could lead to cancer, you need Pap tests and screening for cancer for at least 20 years after your treatment. If Pap tests have been discontinued, risk factors (such as a new sexual partner) need to be reassessed to determine if screening should be resumed. Some women have medical problems that increase the chance of getting cervical cancer. In these cases, your caregiver may recommend more frequent screening and Pap tests.  The human papillomavirus (HPV) test is an additional test that may be used for cervical cancer screening. The HPV test looks for the virus that can cause the cell changes on the cervix. The cells collected during the Pap test can be tested for HPV. The HPV test could be used to screen women aged 44 years and older, and should be used in women of any age  who have unclear Pap test results. After the age of 55, women should have HPV testing at the same frequency as a Pap test.  Colorectal cancer can be detected and often prevented. Most routine colorectal cancer screening begins at the age of 44 and continues through age 20. However, your caregiver may recommend screening at an earlier age if you have risk factors for colon cancer. On a yearly basis, your caregiver may provide home test kits to check for hidden blood in the stool. Use of a small camera at the end of a tube, to directly examine the colon (sigmoidoscopy or colonoscopy), can detect the earliest forms of colorectal cancer. Talk to your caregiver about this at age 86, when routine screening begins. Direct examination of the colon should be repeated every 5 to 10 years through age 13, unless early forms of pre-cancerous polyps or small growths are found.  Hepatitis C blood testing is recommended for all people born from [birth year] through 1967 and any  individual with known risks for hepatitis C.  Practice safe sex. Use condoms and avoid high-risk sexual practices to reduce the spread of sexually transmitted infections (STIs). Sexually active women aged 36 and younger should be checked for Chlamydia, which is a common sexually transmitted infection. Older women with new or multiple partners should also be tested for Chlamydia. Testing for other STIs is recommended if you are sexually active and at increased risk.  Osteoporosis is a disease in which the bones lose minerals and strength with aging. This can result in serious bone fractures. The risk of osteoporosis can be identified using a bone density scan. Women ages 20 and over and women at risk for fractures or osteoporosis should discuss screening with their caregivers. Ask your caregiver whether you should be taking a calcium supplement or vitamin D to reduce the rate of osteoporosis.  Menopause can be associated with physical symptoms and risks. Hormone replacement therapy is available to decrease symptoms and risks. You should talk to your caregiver about whether hormone replacement therapy is right for you.  Use sunscreen. Apply sunscreen liberally and repeatedly throughout the day. You should seek shade when your shadow is shorter than you. Protect yourself by wearing long sleeves, pants, a wide-brimmed hat, and sunglasses year round, whenever you are outdoors.  Notify your caregiver of new moles or changes in moles, especially if there is a change in shape or color. Also notify your caregiver if a mole is larger than the size of a pencil eraser.  Stay current with your immunizations. Document Released: 06/06/2011 Document Revised: 03/18/2013 Document Reviewed: 06/06/2011 [facility] Patient Information 2014 [employer].

## 2014-10-06 ENCOUNTER — Encounter: Payer: Self-pay | Admitting: Gynecology

## 2014-10-07 DIAGNOSIS — M5136 Other intervertebral disc degeneration, lumbar region: Secondary | ICD-10-CM | POA: Diagnosis not present

## 2014-10-07 DIAGNOSIS — I70209 Unspecified atherosclerosis of native arteries of extremities, unspecified extremity: Secondary | ICD-10-CM | POA: Diagnosis not present

## 2014-10-10 DIAGNOSIS — S336XXD Sprain of sacroiliac joint, subsequent encounter: Secondary | ICD-10-CM | POA: Diagnosis not present

## 2014-10-10 DIAGNOSIS — M545 Low back pain: Secondary | ICD-10-CM | POA: Diagnosis not present

## 2014-10-10 DIAGNOSIS — M5136 Other intervertebral disc degeneration, lumbar region: Secondary | ICD-10-CM | POA: Diagnosis not present

## 2014-10-13 DIAGNOSIS — M545 Low back pain: Secondary | ICD-10-CM | POA: Diagnosis not present

## 2014-10-13 DIAGNOSIS — M5136 Other intervertebral disc degeneration, lumbar region: Secondary | ICD-10-CM | POA: Diagnosis not present

## 2014-10-13 DIAGNOSIS — S336XXD Sprain of sacroiliac joint, subsequent encounter: Secondary | ICD-10-CM | POA: Diagnosis not present

## 2014-10-15 DIAGNOSIS — S336XXD Sprain of sacroiliac joint, subsequent encounter: Secondary | ICD-10-CM | POA: Diagnosis not present

## 2014-10-15 DIAGNOSIS — M545 Low back pain: Secondary | ICD-10-CM | POA: Diagnosis not present

## 2014-10-15 DIAGNOSIS — M5136 Other intervertebral disc degeneration, lumbar region: Secondary | ICD-10-CM | POA: Diagnosis not present

## 2014-10-16 DIAGNOSIS — M545 Low back pain: Secondary | ICD-10-CM | POA: Diagnosis not present

## 2014-10-16 DIAGNOSIS — M5136 Other intervertebral disc degeneration, lumbar region: Secondary | ICD-10-CM | POA: Diagnosis not present

## 2014-10-16 DIAGNOSIS — S336XXD Sprain of sacroiliac joint, subsequent encounter: Secondary | ICD-10-CM | POA: Diagnosis not present

## 2014-10-18 ENCOUNTER — Encounter: Payer: Self-pay | Admitting: Internal Medicine

## 2014-10-20 ENCOUNTER — Encounter: Payer: Self-pay | Admitting: Critical Care Medicine

## 2014-10-20 ENCOUNTER — Other Ambulatory Visit: Payer: Self-pay | Admitting: *Deleted

## 2014-10-20 DIAGNOSIS — M5136 Other intervertebral disc degeneration, lumbar region: Secondary | ICD-10-CM | POA: Diagnosis not present

## 2014-10-20 DIAGNOSIS — M545 Low back pain: Secondary | ICD-10-CM | POA: Diagnosis not present

## 2014-10-20 DIAGNOSIS — S336XXD Sprain of sacroiliac joint, subsequent encounter: Secondary | ICD-10-CM | POA: Diagnosis not present

## 2014-10-20 MED ORDER — WARFARIN SODIUM 5 MG PO TABS: 2.5000 mg | ORAL_TABLET | Freq: Every day | ORAL | Status: DC

## 2014-10-20 NOTE — Telephone Encounter (Signed)
Pt sent e-mal needing to get rx for warfarin sent to [facility]...[name]

## 2014-10-20 NOTE — Telephone Encounter (Signed)
10/20/14 mychart message from pt: Message     Would it be possible to have you send a prescription for Benzonate capsule 200 MG to [facility]. I have used up all that I had. Thank you   Pt last seen by PW 10.13.15: Patient Instructions     Ceftin one twice daily for 7days A depomedrol 120mg  injection was given No other changes Return 2 months   Pt's Tessalon was last refilled 03/2012 Tessalon 100mg  #30 with 6 refills Pt is requesting rx for Tessalon 200mg  but there is no record of this strength in her chart Dr Joya Gaskins please advise if okay to refill, thank you.

## 2014-10-22 DIAGNOSIS — M5136 Other intervertebral disc degeneration, lumbar region: Secondary | ICD-10-CM | POA: Diagnosis not present

## 2014-10-22 DIAGNOSIS — M545 Low back pain: Secondary | ICD-10-CM | POA: Diagnosis not present

## 2014-10-22 DIAGNOSIS — S336XXD Sprain of sacroiliac joint, subsequent encounter: Secondary | ICD-10-CM | POA: Diagnosis not present

## 2014-10-23 DIAGNOSIS — M545 Low back pain: Secondary | ICD-10-CM | POA: Diagnosis not present

## 2014-10-23 DIAGNOSIS — S336XXD Sprain of sacroiliac joint, subsequent encounter: Secondary | ICD-10-CM | POA: Diagnosis not present

## 2014-10-23 DIAGNOSIS — M5136 Other intervertebral disc degeneration, lumbar region: Secondary | ICD-10-CM | POA: Diagnosis not present

## 2014-10-27 DIAGNOSIS — M545 Low back pain: Secondary | ICD-10-CM | POA: Diagnosis not present

## 2014-10-27 DIAGNOSIS — S336XXD Sprain of sacroiliac joint, subsequent encounter: Secondary | ICD-10-CM | POA: Diagnosis not present

## 2014-10-27 DIAGNOSIS — M5136 Other intervertebral disc degeneration, lumbar region: Secondary | ICD-10-CM | POA: Diagnosis not present

## 2014-10-28 DIAGNOSIS — M5136 Other intervertebral disc degeneration, lumbar region: Secondary | ICD-10-CM | POA: Diagnosis not present

## 2014-10-28 DIAGNOSIS — M545 Low back pain: Secondary | ICD-10-CM | POA: Diagnosis not present

## 2014-10-28 DIAGNOSIS — S336XXD Sprain of sacroiliac joint, subsequent encounter: Secondary | ICD-10-CM | POA: Diagnosis not present

## 2014-10-29 DIAGNOSIS — M545 Low back pain: Secondary | ICD-10-CM | POA: Diagnosis not present

## 2014-10-29 DIAGNOSIS — M5136 Other intervertebral disc degeneration, lumbar region: Secondary | ICD-10-CM | POA: Diagnosis not present

## 2014-10-29 DIAGNOSIS — S336XXD Sprain of sacroiliac joint, subsequent encounter: Secondary | ICD-10-CM | POA: Diagnosis not present

## 2014-11-03 DIAGNOSIS — S336XXD Sprain of sacroiliac joint, subsequent encounter: Secondary | ICD-10-CM | POA: Diagnosis not present

## 2014-11-03 DIAGNOSIS — M545 Low back pain: Secondary | ICD-10-CM | POA: Diagnosis not present

## 2014-11-03 DIAGNOSIS — M5136 Other intervertebral disc degeneration, lumbar region: Secondary | ICD-10-CM | POA: Diagnosis not present

## 2014-11-04 ENCOUNTER — Encounter: Payer: Self-pay | Admitting: Critical Care Medicine

## 2014-11-04 ENCOUNTER — Telehealth: Payer: Self-pay | Admitting: Critical Care Medicine

## 2014-11-04 ENCOUNTER — Telehealth: Payer: Self-pay | Admitting: Family

## 2014-11-04 ENCOUNTER — Ambulatory Visit (INDEPENDENT_AMBULATORY_CARE_PROVIDER_SITE_OTHER): Payer: Medicare Other

## 2014-11-04 DIAGNOSIS — G459 Transient cerebral ischemic attack, unspecified: Secondary | ICD-10-CM

## 2014-11-04 DIAGNOSIS — Z8679 Personal history of other diseases of the circulatory system: Secondary | ICD-10-CM | POA: Diagnosis not present

## 2014-11-04 DIAGNOSIS — I70209 Unspecified atherosclerosis of native arteries of extremities, unspecified extremity: Secondary | ICD-10-CM | POA: Diagnosis not present

## 2014-11-04 DIAGNOSIS — Z7901 Long term (current) use of anticoagulants: Secondary | ICD-10-CM | POA: Diagnosis not present

## 2014-11-04 DIAGNOSIS — M5136 Other intervertebral disc degeneration, lumbar region: Secondary | ICD-10-CM | POA: Diagnosis not present

## 2014-11-04 LAB — POCT INR: INR: 4.4

## 2014-11-04 MED ORDER — CEFUROXIME AXETIL 250 MG PO TABS: 250.0000 mg | ORAL_TABLET | Freq: Two times a day (BID) | ORAL | Status: DC

## 2014-11-04 MED ORDER — PREDNISONE 10 MG PO TABS
ORAL_TABLET | ORAL | Status: DC
Start: 2014-11-04 — End: 2014-11-17

## 2014-11-04 NOTE — Telephone Encounter (Signed)
Called spoke with pt. She had already called and left [place] a message and has been addressed. Will sign off.

## 2014-11-04 NOTE — Telephone Encounter (Signed)
Spoke with pt and advised of Dr Wright's recommendations.  Rx sent to pharmacy. 

## 2014-11-04 NOTE — Telephone Encounter (Signed)
Spoke with pt, she c/o wheezing, prod cough with clear mucus Xfew days.  No fever, no sob. She is going out of town tomorrow and Thursday and is [facility] [address]    Dr. Joya Gaskins are you ok with this, or do you have any other recommendations?  Thanks!

## 2014-11-04 NOTE — Telephone Encounter (Signed)
Call in cefuroxime 250mg  bid x 5 days.  Prednisone 10mg  Take 4 for two days three for two days two for two days one for two days #20

## 2014-11-04 NOTE — Telephone Encounter (Signed)
Agree with plan 

## 2014-11-07 ENCOUNTER — Telehealth: Payer: Self-pay | Admitting: Internal Medicine

## 2014-11-07 NOTE — Telephone Encounter (Signed)
There is no medication interaction between the Ceftin and the coumadin. The prednisone may either increase or decrease the the blood thinning effects of the coumadin, therefore if there is major concern she can skip an additional dose and continue to take the prednisone until completed and we can check her INR sooner if needed.

## 2014-11-07 NOTE — Telephone Encounter (Signed)
Pt was seen at coumadin clinic this week and INR was 4.4 (per pt), pt started abx and prednisone two days ago. Pt worried about her blood getting thinner due to abx and prednisone as it was thin before starting this. Please advise and let pt know what she should do?  [phone]

## 2014-11-10 ENCOUNTER — Encounter: Payer: Self-pay | Admitting: Adult Health

## 2014-11-10 ENCOUNTER — Ambulatory Visit (INDEPENDENT_AMBULATORY_CARE_PROVIDER_SITE_OTHER): Payer: Medicare Other | Admitting: Adult Health

## 2014-11-10 ENCOUNTER — Telehealth: Payer: Self-pay | Admitting: Critical Care Medicine

## 2014-11-10 VITALS — BP 122/68 | HR 84 | Temp 98.3°F | Ht 63.0 in | Wt 154.8 lb

## 2014-11-10 DIAGNOSIS — J441 Chronic obstructive pulmonary disease with (acute) exacerbation: Secondary | ICD-10-CM | POA: Diagnosis not present

## 2014-11-10 MED ORDER — BENZONATATE 100 MG PO CAPS: 100.0000 mg | ORAL_CAPSULE | Freq: Three times a day (TID) | ORAL | Status: DC | PRN

## 2014-11-10 NOTE — Patient Instructions (Signed)
Mucinex DM twice daily as needed for cough and congestion Finish prednisone taper as directed Tessalon Perles 3 times daily as needed for cough Fluids and rest Follow Dr. Joya Gaskins as planned and as needed Please contact office for sooner follow up if symptoms do not improve or worsen or seek emergency care

## 2014-11-10 NOTE — Assessment & Plan Note (Signed)
Resolving flare   Plan  Mucinex DM twice daily as needed for cough and congestion Finish prednisone taper as directed Tessalon Perles 3 times daily as needed for cough Fluids and rest Follow Dr. Joya Gaskins as planned and as needed Please contact office for sooner follow up if symptoms do not improve or worsen or seek emergency care

## 2014-11-10 NOTE — Telephone Encounter (Signed)
Lauren Stain, MD at 11/04/2014 11:53 AM     Status: Signed       Expand All Collapse All   Call in cefuroxime 250mg  bid x 5 days.  Prednisone 10mg  Take 4 for two days three for two days two for two days one for two days #20     Pt states she is feeling better since taking meds from PW but she does not feel back at baseline with her breathing and would like to see PW-pt is aware that PW is not in the office and is willing to see TP at 10:45am today. Pt is aware that she is being seen at the [facility] office today. Nothing more needed at this time.

## 2014-11-10 NOTE — Addendum Note (Signed)
Addended by: Parke Poisson E on: 11/10/2014 11:32 AM   Modules accepted: Orders

## 2014-11-10 NOTE — Progress Notes (Signed)
   Subjective:    Patient ID: [name], female    DOB: 1941-11-15, 73 y.o.   MRN: [id number]  HPI  73 yo female with COPD   11/10/2014 Acute OV  Complains of cough, congestion, DOE and wheezing for  2 weeks.  Was going out of town, was called in a steroid taper.  Finished abx from 12/1, will finish prednisone on 12/9 She is feeling better but continues to have lingering cough and congestion. Mucus is now clear. She denies any fever, chest pain, orthopnea, PND or leg swelling Remains on Symbicort twice daily.  Review of Systems Constitutional:   No  weight loss, night sweats,  Fevers, chills, fatigue, or  lassitude.  HEENT:   No headaches,  Difficulty swallowing,  Tooth/dental problems, or  Sore throat,                No sneezing, itching, ear ache,  +nasal congestion, post nasal drip,   CV:  No chest pain,  Orthopnea, PND, swelling in lower extremities, anasarca, dizziness, palpitations, syncope.   GI  No heartburn, indigestion, abdominal pain, nausea, vomiting, diarrhea, change in bowel habits, loss of appetite, bloody stools.   Resp:  .  No chest wall deformity  Skin: no rash or lesions.  GU: no dysuria, change in color of urine, no urgency or frequency.  No flank pain, no hematuria   MS:  No joint pain or swelling.  No decreased range of motion.  No back pain.  Psych:  No change in mood or affect. No depression or anxiety.  No memory loss.         Objective:   Physical Exam GEN: A/Ox3; pleasant , NAD, elderly   HEENT:  [place]/AT,  EACs-clear, TMs-wnl, NOSE-clear, THROAT-clear, no lesions, no postnasal drip or exudate noted.   NECK:  Supple w/ fair ROM; no JVD; normal carotid impulses w/o bruits; no thyromegaly or nodules palpated; no lymphadenopathy.  RESP  Clear  P & A; w/o, wheezes/ rales/ or rhonchi.no accessory muscle use, no dullness to percussion  CARD:  RRR, no m/r/g  , no peripheral edema, pulses intact, no cyanosis or clubbing.  GI:   Soft & nt; nml  bowel sounds; no organomegaly or masses detected.  Musco: Warm bil, no deformities or joint swelling noted.  Arthritic changes of the hands   Neuro: alert, no focal deficits noted.    Skin: Warm, no lesions or rashes         Assessment & Plan:

## 2014-11-11 DIAGNOSIS — M5136 Other intervertebral disc degeneration, lumbar region: Secondary | ICD-10-CM | POA: Diagnosis not present

## 2014-11-11 DIAGNOSIS — M545 Low back pain: Secondary | ICD-10-CM | POA: Diagnosis not present

## 2014-11-11 DIAGNOSIS — S336XXD Sprain of sacroiliac joint, subsequent encounter: Secondary | ICD-10-CM | POA: Diagnosis not present

## 2014-11-12 DIAGNOSIS — M5136 Other intervertebral disc degeneration, lumbar region: Secondary | ICD-10-CM | POA: Diagnosis not present

## 2014-11-13 DIAGNOSIS — J449 Chronic obstructive pulmonary disease, unspecified: Secondary | ICD-10-CM | POA: Diagnosis not present

## 2014-11-14 NOTE — Telephone Encounter (Signed)
Patient informed of provider instructions. 

## 2014-11-17 ENCOUNTER — Encounter: Payer: Self-pay | Admitting: Adult Health

## 2014-11-17 ENCOUNTER — Ambulatory Visit (INDEPENDENT_AMBULATORY_CARE_PROVIDER_SITE_OTHER)
Admission: RE | Admit: 2014-11-17 | Discharge: 2014-11-17 | Disposition: A | Discharge status: Home / Self Care | Payer: Medicare Other | Source: Ambulatory Visit | Attending: Adult Health | Admitting: Adult Health

## 2014-11-17 ENCOUNTER — Ambulatory Visit (INDEPENDENT_AMBULATORY_CARE_PROVIDER_SITE_OTHER): Payer: Medicare Other | Admitting: Adult Health

## 2014-11-17 VITALS — BP 132/74 | HR 50 | Temp 98.2°F | Ht 63.0 in | Wt 151.6 lb

## 2014-11-17 DIAGNOSIS — J441 Chronic obstructive pulmonary disease with (acute) exacerbation: Secondary | ICD-10-CM | POA: Diagnosis not present

## 2014-11-17 DIAGNOSIS — J449 Chronic obstructive pulmonary disease, unspecified: Secondary | ICD-10-CM | POA: Diagnosis not present

## 2014-11-17 DIAGNOSIS — J984 Other disorders of lung: Secondary | ICD-10-CM | POA: Diagnosis not present

## 2014-11-17 MED ORDER — TIOTROPIUM BROMIDE MONOHYDRATE 2.5 MCG/ACT IN AERS: 2.0000 | INHALATION_SPRAY | Freq: Every day | RESPIRATORY_TRACT | Status: DC

## 2014-11-17 MED ORDER — LEVALBUTEROL HCL 0.63 MG/3ML IN NEBU
0.6300 mg | INHALATION_SOLUTION | Freq: Once | RESPIRATORY_TRACT | Status: AC
  Administered 2014-11-17: 0.63 mg via RESPIRATORY_TRACT

## 2014-11-17 MED ORDER — PREDNISONE 10 MG PO TABS: ORAL_TABLET | ORAL | Status: DC

## 2014-11-17 NOTE — Patient Instructions (Signed)
Begin Spiriva Respimat 2 puffs daily  Prednisone taper over next week  Mucinex DM twice daily as needed for cough and congestion Tessalon Perles 3 times daily as needed for cough Follow Dr. Joya Gaskins in 6 weeks and As needed   Please contact office for sooner follow up if symptoms do not improve or worsen or seek emergency care

## 2014-11-18 DIAGNOSIS — M5136 Other intervertebral disc degeneration, lumbar region: Secondary | ICD-10-CM | POA: Diagnosis not present

## 2014-11-18 DIAGNOSIS — M545 Low back pain: Secondary | ICD-10-CM | POA: Diagnosis not present

## 2014-11-18 DIAGNOSIS — S336XXD Sprain of sacroiliac joint, subsequent encounter: Secondary | ICD-10-CM | POA: Diagnosis not present

## 2014-11-18 MED ORDER — TIOTROPIUM BROMIDE MONOHYDRATE 2.5 MCG/ACT IN AERS: 2.0000 | INHALATION_SPRAY | Freq: Every day | RESPIRATORY_TRACT | Status: DC

## 2014-11-19 ENCOUNTER — Telehealth: Payer: Self-pay | Admitting: Adult Health

## 2014-11-19 ENCOUNTER — Ambulatory Visit (INDEPENDENT_AMBULATORY_CARE_PROVIDER_SITE_OTHER): Payer: Medicare Other

## 2014-11-19 DIAGNOSIS — G459 Transient cerebral ischemic attack, unspecified: Secondary | ICD-10-CM | POA: Diagnosis not present

## 2014-11-19 DIAGNOSIS — Z7901 Long term (current) use of anticoagulants: Secondary | ICD-10-CM | POA: Diagnosis not present

## 2014-11-19 DIAGNOSIS — Z8679 Personal history of other diseases of the circulatory system: Secondary | ICD-10-CM | POA: Diagnosis not present

## 2014-11-19 DIAGNOSIS — M5136 Other intervertebral disc degeneration, lumbar region: Secondary | ICD-10-CM | POA: Diagnosis not present

## 2014-11-19 DIAGNOSIS — S336XXD Sprain of sacroiliac joint, subsequent encounter: Secondary | ICD-10-CM | POA: Diagnosis not present

## 2014-11-19 DIAGNOSIS — M545 Low back pain: Secondary | ICD-10-CM | POA: Diagnosis not present

## 2014-11-19 LAB — POCT INR: INR: 2.6

## 2014-11-19 MED ORDER — TIOTROPIUM BROMIDE MONOHYDRATE 2.5 MCG/ACT IN AERS: 2.0000 | INHALATION_SPRAY | Freq: Every day | RESPIRATORY_TRACT | Status: DC

## 2014-11-19 NOTE — Telephone Encounter (Signed)
Called and spoke to pt's husband. Pt is requesting refill of [facility]iva [facility]. Pt's husband verbalized understanding and denied any further questions or concerns at this time.

## 2014-11-20 DIAGNOSIS — M5136 Other intervertebral disc degeneration, lumbar region: Secondary | ICD-10-CM | POA: Diagnosis not present

## 2014-11-20 DIAGNOSIS — M4806 Spinal stenosis, lumbar region: Secondary | ICD-10-CM | POA: Diagnosis not present

## 2014-11-20 NOTE — Progress Notes (Signed)
   Subjective:    Patient ID: [name], female    DOB: 28-Sep-1941, 73 y.o.   MRN: [id number]  HPI  73 yo female with COPD    11/17/14 Acute OV  Complains of persistent symptoms. Seen 2 weeks ago for COPD flare , tx w/ steroids  And abx.  Complains of wheezing, prod cough with clear mucus, hoarseness x5 days.  Was seen at [facility] 12/10 and given Kenalog injection that has helped.  denies tightness, f/c/s, n/v/d, purulent sputum. Mucus is now clear. She denies any fever, chest pain, orthopnea, PND or leg swelling Remains on Symbicort twice daily. Feels some better but cough and wheezing are still present.  CXR today with chronic changes, no acute process.     Review of Systems Constitutional:   No  weight loss, night sweats,  Fevers, chills,  +fatigue, or  lassitude.  HEENT:   No headaches,  Difficulty swallowing,  Tooth/dental problems, or  Sore throat,                No sneezing, itching, ear ache,  +nasal congestion, post nasal drip,   CV:  No chest pain,  Orthopnea, PND, swelling in lower extremities, anasarca, dizziness, palpitations, syncope.   GI  No heartburn, indigestion, abdominal pain, nausea, vomiting, diarrhea, change in bowel habits, loss of appetite, bloody stools.   Resp:  .  No chest wall deformity  Skin: no rash or lesions.  GU: no dysuria, change in color of urine, no urgency or frequency.  No flank pain, no hematuria   MS:  No joint pain or swelling.  No decreased range of motion.  No back pain.  Psych:  No change in mood or affect. No depression or anxiety.  No memory loss.         Objective:   Physical Exam GEN: A/Ox3; pleasant , NAD, elderly   HEENT:  [place]/AT,  EACs-clear, TMs-wnl, NOSE-clear, THROAT-clear, no lesions, no postnasal drip or exudate noted.   NECK:  Supple w/ fair ROM; no JVD; normal carotid impulses w/o bruits; no thyromegaly or nodules palpated; no lymphadenopathy.  RESP  Clear  P & A; w/o, wheezes/ rales/ or rhonchi.no accessory  muscle use, no dullness to percussion  CARD:  RRR, no m/r/g  , no peripheral edema, pulses intact, no cyanosis or clubbing.  GI:   Soft & nt; nml bowel sounds; no organomegaly or masses detected.  Musco: Warm bil, no deformities or joint swelling noted.  Arthritic changes of the hands   Neuro: alert, no focal deficits noted.    Skin: Warm, no lesions or rashes         Assessment & Plan:

## 2014-11-20 NOTE — Progress Notes (Signed)
Quick Note:  Called spoke with patient, advised of cxr results / recs as stated by TP. Pt verbalized her understanding and denied any questions. ______ 

## 2014-11-20 NOTE — Assessment & Plan Note (Signed)
Recurrent COPD flare -slow to resolve  Plan  Begin Spiriva Respimat 2 puffs daily  Prednisone taper over next week  Mucinex DM twice daily as needed for cough and congestion Tessalon Perles 3 times daily as needed for cough Follow Dr. Joya Gaskins in 6 weeks and As needed   Please contact office for sooner follow up if symptoms do not improve or worsen or seek emergency care

## 2014-11-25 DIAGNOSIS — S336XXD Sprain of sacroiliac joint, subsequent encounter: Secondary | ICD-10-CM | POA: Diagnosis not present

## 2014-11-25 DIAGNOSIS — M545 Low back pain: Secondary | ICD-10-CM | POA: Diagnosis not present

## 2014-11-25 DIAGNOSIS — M5136 Other intervertebral disc degeneration, lumbar region: Secondary | ICD-10-CM | POA: Diagnosis not present

## 2014-11-27 DIAGNOSIS — M545 Low back pain: Secondary | ICD-10-CM | POA: Diagnosis not present

## 2014-11-27 DIAGNOSIS — M5136 Other intervertebral disc degeneration, lumbar region: Secondary | ICD-10-CM | POA: Diagnosis not present

## 2014-11-27 DIAGNOSIS — S336XXD Sprain of sacroiliac joint, subsequent encounter: Secondary | ICD-10-CM | POA: Diagnosis not present

## 2014-12-02 ENCOUNTER — Other Ambulatory Visit (INDEPENDENT_AMBULATORY_CARE_PROVIDER_SITE_OTHER): Payer: Medicare Other

## 2014-12-02 ENCOUNTER — Ambulatory Visit (INDEPENDENT_AMBULATORY_CARE_PROVIDER_SITE_OTHER): Payer: Medicare Other | Admitting: Internal Medicine

## 2014-12-02 ENCOUNTER — Encounter: Payer: Self-pay | Admitting: Internal Medicine

## 2014-12-02 VITALS — BP 128/76 | HR 90 | Temp 98.1°F | Ht 63.5 in | Wt 152.2 lb

## 2014-12-02 DIAGNOSIS — I10 Essential (primary) hypertension: Secondary | ICD-10-CM

## 2014-12-02 DIAGNOSIS — F329 Major depressive disorder, single episode, unspecified: Secondary | ICD-10-CM

## 2014-12-02 DIAGNOSIS — R7302 Impaired glucose tolerance (oral): Secondary | ICD-10-CM

## 2014-12-02 DIAGNOSIS — F32A Depression, unspecified: Secondary | ICD-10-CM

## 2014-12-02 DIAGNOSIS — E785 Hyperlipidemia, unspecified: Secondary | ICD-10-CM

## 2014-12-02 DIAGNOSIS — M5136 Other intervertebral disc degeneration, lumbar region: Secondary | ICD-10-CM | POA: Diagnosis not present

## 2014-12-02 DIAGNOSIS — M545 Low back pain: Secondary | ICD-10-CM | POA: Diagnosis not present

## 2014-12-02 DIAGNOSIS — S336XXD Sprain of sacroiliac joint, subsequent encounter: Secondary | ICD-10-CM | POA: Diagnosis not present

## 2014-12-02 LAB — CBC WITH DIFFERENTIAL/PLATELET
Basophils Absolute: 0 10*3/uL (ref 0.0–0.1)
Basophils Relative: 0.3 % (ref 0.0–3.0)
Eosinophils Absolute: 0.2 10*3/uL (ref 0.0–0.7)
Eosinophils Relative: 1.8 % (ref 0.0–5.0)
HCT: 39.5 % (ref 36.0–46.0)
Hemoglobin: 13.3 g/dL (ref 12.0–15.0)
Lymphocytes Relative: 16.9 % (ref 12.0–46.0)
Lymphs Abs: 1.4 10*3/uL (ref 0.7–4.0)
MCHC: 33.6 g/dL (ref 30.0–36.0)
MCV: 84.1 fl (ref 78.0–100.0)
Monocytes Absolute: 1.2 10*3/uL — ABNORMAL HIGH (ref 0.1–1.0)
Monocytes Relative: 15 % — ABNORMAL HIGH (ref 3.0–12.0)
Neutro Abs: 5.5 10*3/uL (ref 1.4–7.7)
Neutrophils Relative %: 66 % (ref 43.0–77.0)
Platelets: 266 10*3/uL (ref 150.0–400.0)
RBC: 4.7 Mil/uL (ref 3.87–5.11)
RDW: 14.2 % (ref 11.5–15.5)
WBC: 8.3 10*3/uL (ref 4.0–10.5)

## 2014-12-02 LAB — LIPID PANEL
Cholesterol: 354 mg/dL — ABNORMAL HIGH (ref 0–200)
HDL: 57.9 mg/dL (ref >39.00)
NonHDL: 296.1
Total CHOL/HDL Ratio: 6
Triglycerides: 412 mg/dL — ABNORMAL HIGH (ref 0.0–149.0)
VLDL: 82.4 mg/dL — ABNORMAL HIGH (ref 0.0–40.0)

## 2014-12-02 LAB — BASIC METABOLIC PANEL
BUN: 26 mg/dL — ABNORMAL HIGH (ref 6–23)
CO2: 23 mEq/L (ref 19–32)
Calcium: 9.2 mg/dL (ref 8.4–10.5)
Chloride: 106 mEq/L (ref 96–112)
Creatinine, Ser: 0.9 mg/dL (ref 0.4–1.2)
GFR: 63.19 mL/min (ref >60.00)
Glucose, Bld: 71 mg/dL (ref 70–99)
Potassium: 4.2 mEq/L (ref 3.5–5.1)
Sodium: 138 mEq/L (ref 135–145)

## 2014-12-02 LAB — URINALYSIS, ROUTINE W REFLEX MICROSCOPIC
Bilirubin Urine: NEGATIVE
Ketones, ur: NEGATIVE
Leukocytes, UA: NEGATIVE
Nitrite: NEGATIVE
Specific Gravity, Urine: <=1.005 — AB (ref 1.000–1.030)
Total Protein, Urine: NEGATIVE
Urine Glucose: NEGATIVE
Urobilinogen, UA: 0.2 (ref 0.0–1.0)
pH: 6 (ref 5.0–8.0)

## 2014-12-02 LAB — HEPATIC FUNCTION PANEL
ALT: 28 U/L (ref 0–35)
AST: 24 U/L (ref 0–37)
Albumin: 4 g/dL (ref 3.5–5.2)
Alkaline Phosphatase: 74 U/L (ref 39–117)
Bilirubin, Direct: 0.1 mg/dL (ref 0.0–0.3)
Total Bilirubin: 0.4 mg/dL (ref 0.2–1.2)
Total Protein: 7 g/dL (ref 6.0–8.3)

## 2014-12-02 LAB — HEMOGLOBIN A1C: Hgb A1c MFr Bld: 6.2 % (ref 4.6–6.5)

## 2014-12-02 LAB — TSH: TSH: 1.65 u[IU]/mL (ref 0.35–4.50)

## 2014-12-02 LAB — LDL CHOLESTEROL, DIRECT: Direct LDL: 241.1 mg/dL

## 2014-12-02 MED ORDER — MELOXICAM 15 MG PO TABS: 15.0000 mg | ORAL_TABLET | Freq: Every day | ORAL | Status: DC | PRN

## 2014-12-02 MED ORDER — ALBUTEROL SULFATE HFA 108 (90 BASE) MCG/ACT IN AERS
1.0000 | INHALATION_SPRAY | Freq: Four times a day (QID) | RESPIRATORY_TRACT | Status: DC | PRN
Start: 2014-12-02 — End: 2015-01-06

## 2014-12-02 MED ORDER — OMEPRAZOLE 20 MG PO CPDR: 20.0000 mg | DELAYED_RELEASE_CAPSULE | Freq: Every day | ORAL | Status: DC

## 2014-12-02 MED ORDER — AMLODIPINE BESYLATE 5 MG PO TABS: 5.0000 mg | ORAL_TABLET | Freq: Every morning | ORAL | Status: DC

## 2014-12-02 MED ORDER — CITALOPRAM HYDROBROMIDE 10 MG PO TABS: 10.0000 mg | ORAL_TABLET | Freq: Every morning | ORAL | Status: DC

## 2014-12-02 NOTE — Assessment & Plan Note (Signed)
stable overall by history and exam, recent data reviewed with pt, and pt to continue medical treatment as before,  to f/u any worsening symptoms or concerns Lab Results  Component Value Date   WBC 8.3 12/19/2013   HGB 13.7 12/19/2013   HCT 39.8 12/19/2013   PLT 200.0 12/19/2013   GLUCOSE 91 12/19/2013   CHOL 268* 11/29/2013   TRIG 164.0* 11/29/2013   HDL 57.80 11/29/2013   LDLDIRECT 195.3 11/29/2013   LDLCALC 87 07/23/2009   ALT 24 12/19/2013   AST 20 12/19/2013   NA 138 12/19/2013   K 4.8 12/19/2013   CL 101 12/19/2013   CREATININE 0.9 12/19/2013   BUN 26* 12/19/2013   CO2 29 12/19/2013   TSH 2.43 04/17/2014   INR 2.6 11/19/2014

## 2014-12-02 NOTE — Assessment & Plan Note (Addendum)
stable overall by history and exam, recent data reviewed with pt, and pt to continue medical treatment as before,  to f/u any worsening symptoms or concerns BP Readings from Last 3 Encounters:  12/02/14 128/76  11/17/14 132/74  11/10/14 122/68   ECG reviewed as per emr

## 2014-12-02 NOTE — Progress Notes (Signed)
Subjective:    Patient ID: [name], female    DOB: August 21, 1941, 73 y.o.   MRN: [id number]  HPI  Here for yearly f/u;  Overall doing ok;  Pt denies CP, orthopnea, PND, worsening LE edema, palpitations, dizziness or syncope.  Pt denies neurological change such as new headache, facial or extremity weakness.  Pt denies polydipsia, polyuria, or low sugar symptoms. Pt states overall good compliance with treatment and medications, good tolerability, and has been trying to follow lower cholesterol diet.  Pt denies worsening depressive symptoms, suicidal ideation or panic - celexa working very well. . No fever, night sweats, wt loss, loss of appetite, or other constitutional symptoms.  Pt states good ability with ADL's, has low fall risk, home safety reviewed and adequate, no other significant changes in hearing or vision, and only occasionally active with exercise, as she has ongoing back pain. Pt continues to have recurring LBP without change in severity, bowel or bladder change, fever, wt loss,  worsening LE pain/numbness/weakness, gait change or falls, seeing Dr Gloris Manchester, had recent MRI. Also More difficulty with COPD control in the past month, meds adjusted, has f/u with pulm next mo.  Pt continues to have recurring LBP without change in severity, bowel or bladder change, fever, wt loss,  worsening LE pain/numbness/weakness, gait change or falls - seeing Dr Janelle Floor ortho, to see surgical ortho next wk.  Has known lumbar stenosis, scoliois.  Cant walk any distance on the street, can walk on treadmill holding on as limited by pulm, and also getting PT.   Also still with itch and pain to right arm bicep area recurrent since the episode shingles last yr. Past Medical History  Diagnosis Date  . SBE (subacute bacterial endocarditis) prophylaxis candidate   . COPD (chronic obstructive pulmonary disease)   . GERD (gastroesophageal reflux disease)   . Fibrocystic breast disease   . HLD (hyperlipidemia)     . Osteoarthritis   . Hemorrhoids   . History of cerebrovascular accident   . TIA (transient ischemic attack) last 2001    anticoagulation therapy on coumadin  . HTN (hypertension)   . Stroke     STROKES  . DUB (dysfunctional uterine bleeding)   . Atrophic vaginitis   . Menopausal symptoms   . DI (detrusor instability)   . Status post dilation of esophageal narrowing   . Depression 11/29/2013  . On home oxygen therapy     2 l via  at night  . Nephrolithiasis     hx  . Basal cell carcinoma   . Pneumonia 2013  . Shingles since Nov 18, 2013    on right arm from shoulder to wrist   Past Surgical History  Procedure Laterality Date  . Bladder suspension    . Excision of basal cell ca  2011  . Thumb surgery Right 10-15 yrs ago  . Rectocele repair  2008    A&P REPAIR -DR. MCDIARMID  . Anterior and posterior vaginal repair  2008    A&P REPAIR-DR MCDIARMID  . Colonoscopy    . Varicose vein surgery    . Upper gastrointestinal endoscopy    . Esophagogastroduodenoscopy N/A 03/03/2014    Procedure: ESOPHAGOGASTRODUODENOSCOPY (EGD);  Surgeon: Inda Castle, MD;  Location: [facility];  Service: Endoscopy;  Laterality: N/A;  . Balloon dilation N/A 03/03/2014    Procedure: BALLOON DILATION;  Surgeon: Inda Castle, MD;  Location: [facility];  Service: Endoscopy;  Laterality: N/A;  . Vaginal hysterectomy  [phone]  NO BSO-DR. MABRY    reports that she quit smoking about 19 years ago. Her smoking use included Cigarettes. She has a 60 pack-year smoking history. She has never used smokeless tobacco. She reports that she drinks alcohol. She reports that she does not use illicit drugs. family history includes Breast cancer in her maternal aunt and mother; COPD in her mother; Emphysema in her mother; Uterine cancer in her mother. There is no history of Colon cancer, Esophageal cancer, Diabetes, Kidney disease, Liver disease, Heart disease, Rectal cancer, or Stomach cancer. Allergies   Allergen Reactions  . Clarithromycin     REACTION: possible rash but could have been legionarres dz with rash  . Lipitor [Atorvastatin]     MUSCLE SPASMS  . Simvastatin   . Crestor [Rosuvastatin Calcium]     fagitue and joint pain, "NO STATINS"   Current Outpatient Prescriptions on File Prior to Visit  Medication Sig Dispense Refill  . azelastine (ASTELIN) 137 MCG/SPRAY nasal spray Place 2 sprays into the nose daily. Use in each nostril as directed 90 mL 1  . budesonide-formoterol (SYMBICORT) 160-4.5 MCG/ACT inhaler Inhale 2 puffs into the lungs 2 (two) times daily. 3 Inhaler 4  . Calcium Carbonate-Vitamin D (CALCIUM + D PO) Take 1 tablet by mouth 2 (two) times daily.     . diphenhydramine-acetaminophen (TYLENOL PM) 25-500 MG TABS Take 1 tablet by mouth at bedtime as needed (sleep).    Marland Kitchen estradiol (CLIMARA - DOSED IN MG/24 HR) 0.0375 mg/24hr patch Place 1 patch (0.0375 mg total) onto the skin once a week. 12 patch 4  . irbesartan (AVAPRO) 150 MG tablet Take 150 mg by mouth every morning.    . lidocaine (XYLOCAINE JELLY) 2 % jelly Apply 1 application topically as needed. 30 mL 1  . Melatonin 1 MG TABS Take by mouth at bedtime as needed.    . montelukast (SINGULAIR) 10 MG tablet Take 1 tablet (10 mg total) by mouth daily. 90 tablet 3  . Multiple Vitamin (MULTIVITAMIN) tablet Take 1 tablet by mouth daily.      . Tiotropium Bromide Monohydrate (SPIRIVA RESPIMAT) 2.5 MCG/ACT AERS Inhale 2 puffs into the lungs daily. 3 Inhaler 3  . Vitamin Mixture (VITAMIN E COMPLETE PO) Take 1 capsule by mouth daily.    Marland Kitchen warfarin (COUMADIN) 5 MG tablet Take 0.5-1 tablets (2.5-5 mg total) by mouth daily at 6 PM. 5mg  on Monday, Wednesday and Friday.  All others days 2.5 mg 90 tablet 0  . [DISCONTINUED] valsartan (DIOVAN) 160 MG tablet Take 1 tablet (160 mg total) by mouth daily. 90 tablet 3   No current facility-administered medications on file prior to visit.   Celexa working ok , Denies worsening depressive  symptoms, suicidal ideation, or panic; has ongoing anxiety, husband with PTSD, prostate ca, stressful for her. Review of Systems Constitutional: Negative for increased diaphoresis, other activity, appetite or other siginficant weight change  HENT: Negative for worsening hearing loss, ear pain, facial swelling, mouth sores and neck stiffness.   Eyes: Negative for other worsening pain, redness or visual disturbance.  Respiratory: Negative for shortness of breath and wheezing.   Cardiovascular: Negative for chest pain and palpitations.  Gastrointestinal: Negative for diarrhea, blood in stool, abdominal distention or other pain Genitourinary: Negative for hematuria, flank pain or change in urine volume.  Musculoskeletal: Negative for myalgias or other joint complaints.  Skin: Negative for color change and wound.  Neurological: Negative for syncope and numbness. other than noted Hematological: Negative for adenopathy. or  other swelling Psychiatric/Behavioral: Negative for hallucinations, self-injury, decreased concentration or other worsening agitation.  Has ongoing right shoulder pain as well, states she has partial torn rot cuff, but has ok fxn for now.     Objective:   Physical Exam BP 128/76 mmHg  Pulse 90  Temp(Src) 98.1 F (36.7 C) (Oral)  Ht 5' 3.5" (1.613 m)  Wt 152 lb 4 oz (69.06 kg)  BMI 26.54 kg/m2  SpO2 94% VS noted,  Constitutional: Pt is oriented to person, place, and time./obese Appears well-developed and well-nourished.  Head: Normocephalic and atraumatic.  Right Ear: External ear normal.  Left Ear: External ear normal.  Nose: Nose normal.  Mouth/Throat: Oropharynx is clear and moist.  Eyes: Conjunctivae and EOM are normal. Pupils are equal, round, and reactive to light.  Neck: Normal range of motion. Neck supple. No JVD present. No tracheal deviation present.  Cardiovascular: Normal rate, regular rhythm, normal heart sounds and intact distal pulses.   Pulmonary/Chest:  Effort normal and breath sounds without rales or wheezing , has decreased BS bilat Abdominal: Soft. Bowel sounds are normal. NT. No HSM  Musculoskeletal: Normal range of motion. Exhibits no edema.  Lymphadenopathy:  Has no cervical adenopathy.  Neurological: Pt is alert and oriented to person, place, and time. Pt has normal reflexes. No cranial nerve deficit. Motor grossly intact Skin: Skin is warm and dry. No rash noted.  Psychiatric:  Has mild nervousmood and affect. Behavior is normal.     Assessment & Plan:

## 2014-12-02 NOTE — Patient Instructions (Signed)

## 2014-12-02 NOTE — Assessment & Plan Note (Signed)
stable overall by history and exam, recent data reviewed with pt, and pt to continue medical treatment as before,  to f/u any worsening symptoms or concerns Lab Results  Component Value Date   LDLCALC 87 07/23/2009   For f/u labs

## 2014-12-02 NOTE — Progress Notes (Signed)
Pre visit review using our clinic review tool, if applicable. No additional management support is needed unless otherwise documented below in the visit note. 

## 2014-12-03 ENCOUNTER — Other Ambulatory Visit: Payer: Self-pay | Admitting: Internal Medicine

## 2014-12-03 DIAGNOSIS — E785 Hyperlipidemia, unspecified: Secondary | ICD-10-CM

## 2014-12-04 DIAGNOSIS — S336XXD Sprain of sacroiliac joint, subsequent encounter: Secondary | ICD-10-CM | POA: Diagnosis not present

## 2014-12-04 DIAGNOSIS — M5136 Other intervertebral disc degeneration, lumbar region: Secondary | ICD-10-CM | POA: Diagnosis not present

## 2014-12-04 DIAGNOSIS — M545 Low back pain: Secondary | ICD-10-CM | POA: Diagnosis not present

## 2014-12-09 DIAGNOSIS — M5136 Other intervertebral disc degeneration, lumbar region: Secondary | ICD-10-CM | POA: Diagnosis not present

## 2014-12-09 DIAGNOSIS — M545 Low back pain: Secondary | ICD-10-CM | POA: Diagnosis not present

## 2014-12-09 DIAGNOSIS — S336XXD Sprain of sacroiliac joint, subsequent encounter: Secondary | ICD-10-CM | POA: Diagnosis not present

## 2014-12-11 DIAGNOSIS — S336XXD Sprain of sacroiliac joint, subsequent encounter: Secondary | ICD-10-CM | POA: Diagnosis not present

## 2014-12-11 DIAGNOSIS — M545 Low back pain: Secondary | ICD-10-CM | POA: Diagnosis not present

## 2014-12-11 DIAGNOSIS — M5136 Other intervertebral disc degeneration, lumbar region: Secondary | ICD-10-CM | POA: Diagnosis not present

## 2014-12-12 DIAGNOSIS — M5136 Other intervertebral disc degeneration, lumbar region: Secondary | ICD-10-CM | POA: Diagnosis not present

## 2014-12-12 DIAGNOSIS — M4806 Spinal stenosis, lumbar region: Secondary | ICD-10-CM | POA: Diagnosis not present

## 2014-12-17 ENCOUNTER — Ambulatory Visit (INDEPENDENT_AMBULATORY_CARE_PROVIDER_SITE_OTHER): Payer: Medicare Other | Admitting: Family Medicine

## 2014-12-17 DIAGNOSIS — Z8679 Personal history of other diseases of the circulatory system: Secondary | ICD-10-CM

## 2014-12-17 DIAGNOSIS — G459 Transient cerebral ischemic attack, unspecified: Secondary | ICD-10-CM

## 2014-12-17 DIAGNOSIS — Z7901 Long term (current) use of anticoagulants: Secondary | ICD-10-CM

## 2014-12-17 LAB — POCT INR: INR: 2.3

## 2014-12-23 DIAGNOSIS — M5136 Other intervertebral disc degeneration, lumbar region: Secondary | ICD-10-CM | POA: Diagnosis not present

## 2014-12-23 DIAGNOSIS — M5416 Radiculopathy, lumbar region: Secondary | ICD-10-CM | POA: Diagnosis not present

## 2014-12-23 DIAGNOSIS — J209 Acute bronchitis, unspecified: Secondary | ICD-10-CM | POA: Diagnosis not present

## 2014-12-23 DIAGNOSIS — J449 Chronic obstructive pulmonary disease, unspecified: Secondary | ICD-10-CM | POA: Diagnosis not present

## 2014-12-23 DIAGNOSIS — M4806 Spinal stenosis, lumbar region: Secondary | ICD-10-CM | POA: Diagnosis not present

## 2014-12-24 ENCOUNTER — Encounter: Payer: Self-pay | Admitting: Internal Medicine

## 2014-12-24 ENCOUNTER — Encounter: Payer: Self-pay | Admitting: Critical Care Medicine

## 2014-12-24 NOTE — Telephone Encounter (Signed)
Sending to PW as FYI.

## 2014-12-25 NOTE — Telephone Encounter (Signed)
Robin to see above  Coumadin clinic - Ms Kandyce Rud to be aware - does pt need change in f/U INR timing?

## 2014-12-30 ENCOUNTER — Telehealth: Payer: Self-pay

## 2014-12-30 ENCOUNTER — Ambulatory Visit: Payer: Medicare Other | Admitting: Cardiovascular Disease

## 2014-12-30 DIAGNOSIS — M545 Low back pain: Secondary | ICD-10-CM | POA: Diagnosis not present

## 2014-12-30 DIAGNOSIS — M5136 Other intervertebral disc degeneration, lumbar region: Secondary | ICD-10-CM | POA: Diagnosis not present

## 2014-12-30 DIAGNOSIS — S336XXD Sprain of sacroiliac joint, subsequent encounter: Secondary | ICD-10-CM | POA: Diagnosis not present

## 2014-12-30 NOTE — Telephone Encounter (Signed)
[name] will resume her coumadin (same dose prior to procedure) as advised by Dr. Nelva Bush [facility].  Once she restarts coumadin she can schedule a visit in the coumadin clinic 7-10 days later.

## 2014-12-30 NOTE — Telephone Encounter (Signed)
A user error has taken place: encounter opened in error, closed for administrative reasons.

## 2015-01-01 DIAGNOSIS — S336XXD Sprain of sacroiliac joint, subsequent encounter: Secondary | ICD-10-CM | POA: Diagnosis not present

## 2015-01-01 DIAGNOSIS — M5136 Other intervertebral disc degeneration, lumbar region: Secondary | ICD-10-CM | POA: Diagnosis not present

## 2015-01-01 DIAGNOSIS — M545 Low back pain: Secondary | ICD-10-CM | POA: Diagnosis not present

## 2015-01-05 ENCOUNTER — Other Ambulatory Visit: Payer: Self-pay | Admitting: General Practice

## 2015-01-05 ENCOUNTER — Institutional Professional Consult (permissible substitution): Payer: Medicare Other | Admitting: Internal Medicine

## 2015-01-05 ENCOUNTER — Telehealth: Payer: Self-pay | Admitting: Internal Medicine

## 2015-01-05 ENCOUNTER — Ambulatory Visit (INDEPENDENT_AMBULATORY_CARE_PROVIDER_SITE_OTHER): Payer: Medicare Other | Admitting: Internal Medicine

## 2015-01-05 ENCOUNTER — Encounter: Payer: Self-pay | Admitting: Internal Medicine

## 2015-01-05 VITALS — BP 150/62 | HR 80 | Ht 63.0 in | Wt 155.8 lb

## 2015-01-05 DIAGNOSIS — I639 Cerebral infarction, unspecified: Secondary | ICD-10-CM

## 2015-01-05 LAB — PROTIME-INR
INR: 3.19 — ABNORMAL HIGH (ref <1.50)
Prothrombin Time: 32.7 seconds — ABNORMAL HIGH (ref 11.6–15.2)

## 2015-01-05 NOTE — Patient Instructions (Signed)
Your physician recommends that you continue on your current medications as directed. Please refer to the Current Medication list given to you today.  Your physician recommends that you return for lab work TODAY (STAT INR) Your physician recommends that you return for lab work in: April (LIPIDS)--OK TO [place] [facility]--NO [place].

## 2015-01-05 NOTE — Progress Notes (Addendum)
HPI Patient is a 74 yo who was referred for lipid Rx  I saw her several years ago She has failed 3 statins due to muscle aches Lipids in Dec LDL was 241  Trig 412  She admits to eating terribly the month before   Is changing diet   She has a history of CVA  Early 2000s  Atrial septal aneurysm.  On coumadin.  She denies CP  Breathing is OK   Signif back problems  Not felt to be surgical candidate  Planning to undergo injection on Fri     Allergies  Allergen Reactions  . Clarithromycin     REACTION: possible rash but could have been legionarres dz with rash  . Lipitor [Atorvastatin]     MUSCLE SPASMS  . Simvastatin   . Crestor [Rosuvastatin Calcium]     fagitue and joint pain, "NO STATINS"    Current Outpatient Prescriptions  Medication Sig Dispense Refill  . albuterol (PROVENTIL HFA;VENTOLIN HFA) 108 (90 BASE) MCG/ACT inhaler Inhale 1-2 puffs into the lungs every 6 (six) hours as needed for wheezing or shortness of breath. 3 Inhaler 3  . amLODipine (NORVASC) 5 MG tablet Take 1 tablet (5 mg total) by mouth every morning. 90 tablet 3  . azelastine (ASTELIN) 137 MCG/SPRAY nasal spray Place 2 sprays into the nose daily. Use in each nostril as directed 90 mL 1  . Calcium Carbonate-Vitamin D (CALCIUM + D PO) Take 1 tablet by mouth 2 (two) times daily.     . citalopram (CELEXA) 10 MG tablet Take 1 tablet (10 mg total) by mouth every morning. 90 tablet 3  . diphenhydramine-acetaminophen (TYLENOL PM) 25-500 MG TABS Take 1 tablet by mouth at bedtime as needed (sleep).    Marland Kitchen estradiol (CLIMARA - DOSED IN MG/24 HR) 0.0375 mg/24hr patch Place 1 patch (0.0375 mg total) onto the skin once a week. 12 patch 4  . irbesartan (AVAPRO) 150 MG tablet Take 150 mg by mouth every morning.    . lidocaine (XYLOCAINE JELLY) 2 % jelly Apply 1 application topically as needed. 30 mL 1  . Melatonin 1 MG TABS Take by mouth at bedtime as needed.    . meloxicam (MOBIC) 15 MG tablet Take 1 tablet (15 mg total) by mouth  daily as needed for pain. 90 tablet 3  . montelukast (SINGULAIR) 10 MG tablet Take 1 tablet (10 mg total) by mouth daily. 90 tablet 3  . Multiple Vitamin (MULTIVITAMIN) tablet Take 1 tablet by mouth daily.      Marland Kitchen omeprazole (PRILOSEC) 20 MG capsule Take 1 capsule (20 mg total) by mouth daily. 90 capsule 3  . Tiotropium Bromide Monohydrate (SPIRIVA RESPIMAT) 2.5 MCG/ACT AERS Inhale 2 puffs into the lungs daily. 3 Inhaler 3  . Vitamin Mixture (VITAMIN E COMPLETE PO) Take 1 capsule by mouth daily.    Marland Kitchen warfarin (COUMADIN) 5 MG tablet Take 0.5-1 tablets (2.5-5 mg total) by mouth daily at 6 PM. 5mg  on Monday, Wednesday and Friday.  All others days 2.5 mg 90 tablet 0  . budesonide-formoterol (SYMBICORT) 160-4.5 MCG/ACT inhaler Inhale 2 puffs into the lungs 2 (two) times daily. 3 Inhaler 4  . [DISCONTINUED] valsartan (DIOVAN) 160 MG tablet Take 1 tablet (160 mg total) by mouth daily. 90 tablet 3   No current facility-administered medications for this visit.    Past Medical History  Diagnosis Date  . SBE (subacute bacterial endocarditis) prophylaxis candidate   . COPD (chronic obstructive pulmonary disease)   . GERD (  gastroesophageal reflux disease)   . Fibrocystic breast disease   . HLD (hyperlipidemia)   . Osteoarthritis   . Hemorrhoids   . History of cerebrovascular accident   . TIA (transient ischemic attack) last 2001    anticoagulation therapy on coumadin  . HTN (hypertension)   . Stroke     STROKES  . DUB (dysfunctional uterine bleeding)   . Atrophic vaginitis   . Menopausal symptoms   . DI (detrusor instability)   . Status post dilation of esophageal narrowing   . Depression 11/29/2013  . On home oxygen therapy     2 l via [place] at night  . Nephrolithiasis     hx  . Basal cell carcinoma   . Pneumonia 2013  . Shingles since Nov 18, 2013    on right arm from shoulder to wrist    Past Surgical History  Procedure Laterality Date  . Bladder suspension    . Excision of basal  cell ca  2011  . Thumb surgery Right 10-15 yrs ago  . Rectocele repair  2008    A&P REPAIR -DR. MCDIARMID  . Anterior and posterior vaginal repair  2008    A&P REPAIR-DR MCDIARMID  . Colonoscopy    . Varicose vein surgery    . Upper gastrointestinal endoscopy    . Esophagogastroduodenoscopy N/A 03/03/2014    Procedure: ESOPHAGOGASTRODUODENOSCOPY (EGD);  Surgeon: Inda Castle, MD;  Location: [facility];  Service: Endoscopy;  Laterality: N/A;  . Balloon dilation N/A 03/03/2014    Procedure: BALLOON DILATION;  Surgeon: Inda Castle, MD;  Location: [facility];  Service: Endoscopy;  Laterality: N/A;  . Vaginal hysterectomy  1983    NO BSO-DR. MABRY    Family History  Problem Relation Age of Onset  . COPD Mother   . Breast cancer Mother     mid 35's  . Uterine cancer Mother   . Emphysema Mother   . Breast cancer Maternal Aunt     x 3 aunts, post menopausal  . Colon cancer Neg Hx   . Esophageal cancer Neg Hx   . Diabetes Neg Hx   . Kidney disease Neg Hx   . Liver disease Neg Hx   . Heart disease Neg Hx   . Rectal cancer Neg Hx   . Stomach cancer Neg Hx     History   Social History  . Marital Status: Married    Spouse Name: N/A    Number of Children: 2  . Years of Education: N/A   Occupational History  . retired    Social History Main Topics  . Smoking status: Former Smoker -- 2.00 packs/day for 30 years    Types: Cigarettes    Quit date: 12/06/1995  . Smokeless tobacco: Never Used  . Alcohol Use: Yes     Comment: occas very little  . Drug Use: No  . Sexual Activity: Yes    Birth Control/ Protection: Surgical     Comment: HYST   Other Topics Concern  . Not on file   Social History Narrative   Retired- Investment banker, operational.     Review of Systems:  All systems reviewed.  They are negative to the above problem except as previously stated.  Vital Signs: BP 150/62 mmHg  Pulse 80  Ht 5\' 3"  (1.6 m)  Wt 155 lb 12.8 oz (70.67 kg)  BMI 27.61  kg/m2  SpO2 93%  Physical Exam Patient is in NAD   HEENT:  Normocephalic, atraumatic.  EOMI, PERRLA.  Neck: JVP is normal.  No bruits.  Lungs: clear to auscultation. No rales no wheezes.  Heart: Regular rate and rhythm. Normal S1, S2. No S3.   No significant murmurs. PMI not displaced.  Abdomen:  Supple, nontender. Normal bowel sounds. No masses. No hepatomegaly.  Extremities:   Good distal pulses throughout. No lower extremity edema.  Musculoskeletal :moving all extremities.  Neuro:   alert and oriented x3.  CN II-XII grossly intact.  EKG SR  Anterior MI  (old)   Assessment and Plan:  1.  HL  Profound  Patient would like to optimize numbers with diet.  I think she will need Rx but better to get a good baseline   Will repeat in 4 months then consider Rx with PCSK9 inhibitor  2.  Hx CVA  Records are in previous record system  Difficult to retrieve  I did review TEE and she has an atrial septal aneurysm. Has been maintained on coumadin  She should have Lovenox bridging  I have forwarded records to Coumadin clinic for review WIll check INR today    3.  HTN  BP is a little high  Would follow for now.   Will f/u with lipids  No definite clinic appt yet  May refer to lipid clinic

## 2015-01-05 NOTE — Telephone Encounter (Signed)
Pt called stated that she is having an operation on 01/09/15 and Dr. Jenny Reichmann told her get off of coumadin (she took her last pill yesterday) before her operation. Pt advise to let the coumadin nurse know of this. Any question please call pt.

## 2015-01-05 NOTE — Telephone Encounter (Signed)
Spoke with patient in regards to procedure on Friday 2-5.  Patient will be coming to [place] office on Thursday 2-4 to have INR checked before procedure.

## 2015-01-05 NOTE — Telephone Encounter (Signed)
Message routed to [place], Therapist, sports.

## 2015-01-06 ENCOUNTER — Telehealth: Payer: Self-pay | Admitting: General Practice

## 2015-01-06 ENCOUNTER — Ambulatory Visit (INDEPENDENT_AMBULATORY_CARE_PROVIDER_SITE_OTHER): Payer: Medicare Other | Admitting: Critical Care Medicine

## 2015-01-06 ENCOUNTER — Other Ambulatory Visit: Payer: Self-pay | Admitting: General Practice

## 2015-01-06 ENCOUNTER — Encounter: Payer: Self-pay | Admitting: Critical Care Medicine

## 2015-01-06 VITALS — BP 134/54 | HR 61 | Temp 98.6°F | Ht 63.0 in | Wt 155.0 lb

## 2015-01-06 DIAGNOSIS — J441 Chronic obstructive pulmonary disease with (acute) exacerbation: Secondary | ICD-10-CM

## 2015-01-06 DIAGNOSIS — S336XXD Sprain of sacroiliac joint, subsequent encounter: Secondary | ICD-10-CM | POA: Diagnosis not present

## 2015-01-06 DIAGNOSIS — J449 Chronic obstructive pulmonary disease, unspecified: Secondary | ICD-10-CM

## 2015-01-06 DIAGNOSIS — M5136 Other intervertebral disc degeneration, lumbar region: Secondary | ICD-10-CM | POA: Diagnosis not present

## 2015-01-06 DIAGNOSIS — M545 Low back pain: Secondary | ICD-10-CM | POA: Diagnosis not present

## 2015-01-06 MED ORDER — BUDESONIDE-FORMOTEROL FUMARATE 160-4.5 MCG/ACT IN AERO: 2.0000 | INHALATION_SPRAY | Freq: Two times a day (BID) | RESPIRATORY_TRACT | Status: DC

## 2015-01-06 MED ORDER — FLUTTER DEVI: Status: DC

## 2015-01-06 MED ORDER — TIOTROPIUM BROMIDE MONOHYDRATE 2.5 MCG/ACT IN AERS: 2.0000 | INHALATION_SPRAY | Freq: Every day | RESPIRATORY_TRACT | Status: DC

## 2015-01-06 MED ORDER — ALBUTEROL SULFATE HFA 108 (90 BASE) MCG/ACT IN AERS: 1.0000 | INHALATION_SPRAY | Freq: Four times a day (QID) | RESPIRATORY_TRACT | Status: DC | PRN

## 2015-01-06 MED ORDER — ENOXAPARIN SODIUM 100 MG/ML ~~LOC~~ SOLN: 1.5000 mg/kg | SUBCUTANEOUS | Status: DC

## 2015-01-06 MED ORDER — FAMOTIDINE 40 MG PO TABS: 40.0000 mg | ORAL_TABLET | Freq: Every evening | ORAL | Status: DC

## 2015-01-06 NOTE — Assessment & Plan Note (Signed)
COPD gold stage C with recurrent exacerbations Status post recent tracheobronchitis flare now improved Need for a renewal on the flutter valve Reflux disease also precipitating factor Plan Take pepcid 40mg  at bedtime Stay on omeprazole daily Stay on spiriva and symbicort, refills sent Refills on albuterol sent Reflux diet Return 3 months

## 2015-01-06 NOTE — Telephone Encounter (Signed)
Called patient to give instructions for Lovenox.

## 2015-01-06 NOTE — Progress Notes (Signed)
Subjective:    Patient ID: [name], female    DOB: 1941/05/06, 74 y.o.   MRN: [id number]  HPI  74 yo female with COPD    01/06/2015 Chief Complaint  Patient presents with  . Follow-up    Finished last dose of abx this morning (given 14 days at [facility]) - feeling better.  Feels Spiriva Respimat is helping.  SOB has improved.  Cough improved but still present mainly with food and with heart burn.  Pt seen by NP 11/17/14 and again went to UC two weeks ago 12/2014.  Severe cough,  Pt was using flutter valve:  Every time used it cough was worse.  No abx in 12/15:  But Rx spiriva Respimat and this has helped. At UC: Rx ABX for 14 days and ABX injection and pred pulse Now off all therapy.  Also had cough syrup.  CXR 11/2014 chronic change only.  GERD is an issue, now on omeprazole once daily.  Cough is better and dyspnea is better and can work out . Pt is now off oxygen Pt with back issues, getting injections. No surgery indicated.       Review of Systems Constitutional:   No  weight loss, night sweats,  Fevers, chills,  +fatigue, or  lassitude.  HEENT:   No headaches,  Difficulty swallowing,  Tooth/dental problems, or  Sore throat,                No sneezing, itching, ear ache,  +nasal congestion, post nasal drip,   CV:  No chest pain,  Orthopnea, PND, swelling in lower extremities, anasarca, dizziness, palpitations, syncope.   GI  No heartburn, indigestion, abdominal pain, nausea, vomiting, diarrhea, change in bowel habits, loss of appetite, bloody stools.   Resp:  .  No chest wall deformity  Skin: no rash or lesions.  GU: no dysuria, change in color of urine, no urgency or frequency.  No flank pain, no hematuria   MS:  No joint pain or swelling.  No decreased range of motion.  No back pain.  Psych:  No change in mood or affect. No depression or anxiety.  No memory loss.      Objective:   Physical Exam BP 134/54 mmHg  Pulse 61  Temp(Src) 98.6 F (37 C)  (Oral)  Ht 5\' 3"  (1.6 m)  Wt 155 lb (70.308 kg)  BMI 27.46 kg/m2  SpO2 95%  GEN: A/Ox3; pleasant , NAD, elderly   HEENT:  [place]/AT,  EACs-clear, TMs-wnl, NOSE-clear, THROAT-clear, no lesions, no postnasal drip or exudate noted.   NECK:  Supple w/ fair ROM; no JVD; normal carotid impulses w/o bruits; no thyromegaly or nodules palpated; no lymphadenopathy.  RESP  Clear  P & A; w/o, wheezes/ rales/ or rhonchi.no accessory muscle use, no dullness to percussion  CARD:  RRR, no m/r/g  , no peripheral edema, pulses intact, no cyanosis or clubbing.  GI:   Soft & nt; nml bowel sounds; no organomegaly or masses detected.  Musco: Warm bil, no deformities or joint swelling noted.  Arthritic changes of the hands   Neuro: alert, no focal deficits noted.    Skin: Warm, no lesions or rashes     Assessment & Plan:   Gold B Copd with acute bronchitis COPD gold stage C with recurrent exacerbations Status post recent tracheobronchitis flare now improved Need for a renewal on the flutter valve Reflux disease also precipitating factor Plan Take pepcid 40mg  at bedtime Stay  on omeprazole daily Stay on spiriva and symbicort, refills sent Refills on albuterol sent Reflux diet Return 3 months    Updated Medication List Outpatient Encounter Prescriptions as of 01/06/2015  Medication Sig  . albuterol (PROVENTIL HFA;VENTOLIN HFA) 108 (90 BASE) MCG/ACT inhaler Inhale 1-2 puffs into the lungs every 6 (six) hours as needed for wheezing or shortness of breath.  Marland Kitchen amLODipine (NORVASC) 5 MG tablet Take 1 tablet (5 mg total) by mouth every morning.  Marland Kitchen azelastine (ASTELIN) 137 MCG/SPRAY nasal spray Place 2 sprays into the nose daily. Use in each nostril as directed  . budesonide-formoterol (SYMBICORT) 160-4.5 MCG/ACT inhaler Inhale 2 puffs into the lungs 2 (two) times daily.  . Calcium Carbonate-Vitamin D (CALCIUM + D PO) Take 1 tablet by mouth 2 (two) times daily.   . citalopram (CELEXA) 10 MG tablet Take 1  tablet (10 mg total) by mouth every morning.  . diphenhydramine-acetaminophen (TYLENOL PM) 25-500 MG TABS Take 1 tablet by mouth at bedtime as needed (sleep).  Marland Kitchen estradiol (CLIMARA - DOSED IN MG/24 HR) 0.0375 mg/24hr patch Place 1 patch (0.0375 mg total) onto the skin once a week.  . irbesartan (AVAPRO) 150 MG tablet Take 150 mg by mouth every morning.  . lidocaine (XYLOCAINE JELLY) 2 % jelly Apply 1 application topically as needed.  . Melatonin 1 MG TABS Take by mouth at bedtime as needed.  . meloxicam (MOBIC) 15 MG tablet Take 1 tablet (15 mg total) by mouth daily as needed for pain.  . montelukast (SINGULAIR) 10 MG tablet Take 1 tablet (10 mg total) by mouth daily.  . Multiple Vitamin (MULTIVITAMIN) tablet Take 1 tablet by mouth daily.    Marland Kitchen omeprazole (PRILOSEC) 20 MG capsule Take 1 capsule (20 mg total) by mouth daily.  . Tiotropium Bromide Monohydrate (SPIRIVA RESPIMAT) 2.5 MCG/ACT AERS Inhale 2 puffs into the lungs daily.  . Vitamin Mixture (VITAMIN E COMPLETE PO) Take 1 capsule by mouth daily.  . [DISCONTINUED] albuterol (PROVENTIL HFA;VENTOLIN HFA) 108 (90 BASE) MCG/ACT inhaler Inhale 1-2 puffs into the lungs every 6 (six) hours as needed for wheezing or shortness of breath.  . [DISCONTINUED] budesonide-formoterol (SYMBICORT) 160-4.5 MCG/ACT inhaler Inhale 2 puffs into the lungs 2 (two) times daily.  . [DISCONTINUED] Tiotropium Bromide Monohydrate (SPIRIVA RESPIMAT) 2.5 MCG/ACT AERS Inhale 2 puffs into the lungs daily.  . famotidine (PEPCID) 40 MG tablet Take 1 tablet (40 mg total) by mouth every evening. Can obtain over the counter and take 4 10mg  tabs at bedtime  . Respiratory Therapy Supplies (FLUTTER) DEVI Use 2-3 times per day  . warfarin (COUMADIN) 5 MG tablet Take 0.5-1 tablets (2.5-5 mg total) by mouth daily at 6 PM. 5mg  on Monday, Wednesday and Friday.  All others days 2.5 mg (Patient not taking: Reported on 01/06/2015)

## 2015-01-06 NOTE — Patient Instructions (Signed)
Take pepcid 40mg  at bedtime Stay on omeprazole daily Stay on spiriva and symbicort, refills sent Refills on albuterol sent Reflux diet Return 3 months

## 2015-01-07 ENCOUNTER — Telehealth: Payer: Self-pay

## 2015-01-07 DIAGNOSIS — S336XXD Sprain of sacroiliac joint, subsequent encounter: Secondary | ICD-10-CM | POA: Diagnosis not present

## 2015-01-07 DIAGNOSIS — M5136 Other intervertebral disc degeneration, lumbar region: Secondary | ICD-10-CM | POA: Diagnosis not present

## 2015-01-07 DIAGNOSIS — M545 Low back pain: Secondary | ICD-10-CM | POA: Diagnosis not present

## 2015-01-07 NOTE — Telephone Encounter (Signed)
Oh yes, the warnings are more to do with the company being sued, and is extremely rare to have side effect, and I have taken this several times myself. thanks

## 2015-01-07 NOTE — Telephone Encounter (Signed)
Patient informed of PCP response to concern over warnings of medication.  Patient ok now to take

## 2015-01-07 NOTE — Telephone Encounter (Signed)
Patient is having a procedure on Friday and picked up lovonox for injections that PCP ok.  She has read the instructions and states can cause bleeding, and possible paralysis.  Please advise if safe for her to take

## 2015-01-08 ENCOUNTER — Ambulatory Visit (INDEPENDENT_AMBULATORY_CARE_PROVIDER_SITE_OTHER): Payer: Medicare Other | Admitting: General Practice

## 2015-01-08 ENCOUNTER — Other Ambulatory Visit: Payer: Self-pay | Admitting: General Practice

## 2015-01-08 DIAGNOSIS — Z5181 Encounter for therapeutic drug level monitoring: Secondary | ICD-10-CM

## 2015-01-08 LAB — POCT INR: INR: 2.5

## 2015-01-08 MED ORDER — ENOXAPARIN SODIUM 100 MG/ML ~~LOC~~ SOLN: 1.5000 mg/kg | SUBCUTANEOUS | Status: DC

## 2015-01-08 NOTE — Progress Notes (Signed)
Pre visit review using our clinic review tool, if applicable. No additional management support is needed unless otherwise documented below in the visit note. 

## 2015-01-08 NOTE — Patient Instructions (Addendum)
Thursday 2-4 - Lovenox Friday 2-5 - Lovenox Saturday 2-6 - Lovenox Sunday 2-7 - Lovenox Monday 2-8 - Lovenox Tuesday 2-9 - DO NOT TAKE LOVENOX (PROCEDURE) Wednesday 2-10 - Lovenox and 7.5 mg (1 1/2 tablets of coumadin) Thursday 2-11 -  Lovenox and 7.5 mg (1 1/2 tablets of coumadin) Friday 2-12 -  Lovenox and 5 mg (1 tablet of coumadin) Saturday 2-13 - Lovenox and 5 mg (1 tablet of coumadin) Sunday 2-14 - Lovenox and 5 mg (1 tablet of coumadin) Monday 2-15 - Re-check INR at [facility]  Patient verbalized understanding

## 2015-01-12 ENCOUNTER — Telehealth: Payer: Self-pay | Admitting: Internal Medicine

## 2015-01-12 ENCOUNTER — Ambulatory Visit (INDEPENDENT_AMBULATORY_CARE_PROVIDER_SITE_OTHER): Payer: Medicare Other | Admitting: General Practice

## 2015-01-12 DIAGNOSIS — Z7901 Long term (current) use of anticoagulants: Secondary | ICD-10-CM | POA: Diagnosis not present

## 2015-01-12 DIAGNOSIS — G459 Transient cerebral ischemic attack, unspecified: Secondary | ICD-10-CM | POA: Diagnosis not present

## 2015-01-12 DIAGNOSIS — I639 Cerebral infarction, unspecified: Secondary | ICD-10-CM

## 2015-01-12 DIAGNOSIS — Z8679 Personal history of other diseases of the circulatory system: Secondary | ICD-10-CM

## 2015-01-12 LAB — POCT INR: INR: 1.3

## 2015-01-12 NOTE — Telephone Encounter (Signed)
States she is having surgery tomorrow and Jenny Reichmann is Sport and exercise psychologist.  Patient states something is not right and needs a call back as soon as possible.  Would like a call back before she takes an injection this morning.

## 2015-01-12 NOTE — Progress Notes (Signed)
Pre visit review using our clinic review tool, if applicable. No additional management support is needed unless otherwise documented below in the visit note. 

## 2015-01-13 ENCOUNTER — Ambulatory Visit (INDEPENDENT_AMBULATORY_CARE_PROVIDER_SITE_OTHER): Payer: Medicare Other | Admitting: *Deleted

## 2015-01-13 DIAGNOSIS — I639 Cerebral infarction, unspecified: Secondary | ICD-10-CM | POA: Diagnosis not present

## 2015-01-13 DIAGNOSIS — M5416 Radiculopathy, lumbar region: Secondary | ICD-10-CM | POA: Diagnosis not present

## 2015-01-13 DIAGNOSIS — Z7901 Long term (current) use of anticoagulants: Secondary | ICD-10-CM | POA: Diagnosis not present

## 2015-01-13 LAB — POCT INR
INR: 1.1
INR: 1.1

## 2015-01-19 ENCOUNTER — Ambulatory Visit (INDEPENDENT_AMBULATORY_CARE_PROVIDER_SITE_OTHER): Payer: Medicare Other | Admitting: General Practice

## 2015-01-19 DIAGNOSIS — I639 Cerebral infarction, unspecified: Secondary | ICD-10-CM | POA: Diagnosis not present

## 2015-01-19 DIAGNOSIS — Z7901 Long term (current) use of anticoagulants: Secondary | ICD-10-CM

## 2015-01-19 LAB — POCT INR: INR: 2.1

## 2015-01-19 NOTE — Progress Notes (Signed)
Pre visit review using our clinic review tool, if applicable. No additional management support is needed unless otherwise documented below in the visit note. 

## 2015-01-20 DIAGNOSIS — M5416 Radiculopathy, lumbar region: Secondary | ICD-10-CM | POA: Diagnosis not present

## 2015-01-20 DIAGNOSIS — M4806 Spinal stenosis, lumbar region: Secondary | ICD-10-CM | POA: Diagnosis not present

## 2015-01-21 ENCOUNTER — Encounter: Payer: Self-pay | Admitting: Internal Medicine

## 2015-01-21 DIAGNOSIS — S336XXD Sprain of sacroiliac joint, subsequent encounter: Secondary | ICD-10-CM | POA: Diagnosis not present

## 2015-01-21 DIAGNOSIS — M545 Low back pain: Secondary | ICD-10-CM | POA: Diagnosis not present

## 2015-01-21 DIAGNOSIS — M5136 Other intervertebral disc degeneration, lumbar region: Secondary | ICD-10-CM | POA: Diagnosis not present

## 2015-01-22 MED ORDER — OMEPRAZOLE 20 MG PO CPDR
20.0000 mg | DELAYED_RELEASE_CAPSULE | Freq: Two times a day (BID) | ORAL | Status: DC
Start: 2015-01-22 — End: 2016-04-14

## 2015-01-22 MED ORDER — OMEPRAZOLE 20 MG PO CPDR
20.0000 mg | DELAYED_RELEASE_CAPSULE | Freq: Two times a day (BID) | ORAL | Status: DC
Start: 2015-01-22 — End: 2015-01-22

## 2015-01-22 NOTE — Addendum Note (Signed)
Addended by: Biagio Borg on: 01/22/2015 01:08 PM   Modules accepted: Orders

## 2015-01-23 DIAGNOSIS — M545 Low back pain: Secondary | ICD-10-CM | POA: Diagnosis not present

## 2015-01-23 DIAGNOSIS — M5136 Other intervertebral disc degeneration, lumbar region: Secondary | ICD-10-CM | POA: Diagnosis not present

## 2015-01-23 DIAGNOSIS — S336XXD Sprain of sacroiliac joint, subsequent encounter: Secondary | ICD-10-CM | POA: Diagnosis not present

## 2015-01-24 ENCOUNTER — Encounter: Payer: Self-pay | Admitting: Internal Medicine

## 2015-01-28 DIAGNOSIS — M5136 Other intervertebral disc degeneration, lumbar region: Secondary | ICD-10-CM | POA: Diagnosis not present

## 2015-01-28 DIAGNOSIS — S336XXD Sprain of sacroiliac joint, subsequent encounter: Secondary | ICD-10-CM | POA: Diagnosis not present

## 2015-01-28 DIAGNOSIS — M545 Low back pain: Secondary | ICD-10-CM | POA: Diagnosis not present

## 2015-01-30 DIAGNOSIS — M5136 Other intervertebral disc degeneration, lumbar region: Secondary | ICD-10-CM | POA: Diagnosis not present

## 2015-01-30 DIAGNOSIS — M545 Low back pain: Secondary | ICD-10-CM | POA: Diagnosis not present

## 2015-01-30 DIAGNOSIS — S336XXD Sprain of sacroiliac joint, subsequent encounter: Secondary | ICD-10-CM | POA: Diagnosis not present

## 2015-02-03 ENCOUNTER — Encounter: Payer: Self-pay | Admitting: Internal Medicine

## 2015-02-03 DIAGNOSIS — M5136 Other intervertebral disc degeneration, lumbar region: Secondary | ICD-10-CM | POA: Diagnosis not present

## 2015-02-03 DIAGNOSIS — S336XXD Sprain of sacroiliac joint, subsequent encounter: Secondary | ICD-10-CM | POA: Diagnosis not present

## 2015-02-03 DIAGNOSIS — M545 Low back pain: Secondary | ICD-10-CM | POA: Diagnosis not present

## 2015-02-04 ENCOUNTER — Other Ambulatory Visit: Payer: Self-pay | Admitting: *Deleted

## 2015-02-04 MED ORDER — WARFARIN SODIUM 5 MG PO TABS: 2.5000 mg | ORAL_TABLET | Freq: Every day | ORAL | Status: DC

## 2015-02-04 MED ORDER — AMLODIPINE BESYLATE 5 MG PO TABS: 5.0000 mg | ORAL_TABLET | Freq: Every morning | ORAL | Status: DC

## 2015-02-04 NOTE — Telephone Encounter (Signed)
Pt sent email needing refill on her amlodipine & warfarin sent to [facility]...Johny Chess

## 2015-02-05 DIAGNOSIS — M545 Low back pain: Secondary | ICD-10-CM | POA: Diagnosis not present

## 2015-02-05 DIAGNOSIS — M5136 Other intervertebral disc degeneration, lumbar region: Secondary | ICD-10-CM | POA: Diagnosis not present

## 2015-02-05 DIAGNOSIS — S336XXD Sprain of sacroiliac joint, subsequent encounter: Secondary | ICD-10-CM | POA: Diagnosis not present

## 2015-02-10 ENCOUNTER — Other Ambulatory Visit: Payer: Self-pay | Admitting: Neurological Surgery

## 2015-02-10 ENCOUNTER — Encounter: Payer: Self-pay | Admitting: Internal Medicine

## 2015-02-10 DIAGNOSIS — M4806 Spinal stenosis, lumbar region: Secondary | ICD-10-CM | POA: Diagnosis not present

## 2015-02-10 DIAGNOSIS — M810 Age-related osteoporosis without current pathological fracture: Secondary | ICD-10-CM

## 2015-02-10 DIAGNOSIS — M4120 Other idiopathic scoliosis, site unspecified: Secondary | ICD-10-CM | POA: Diagnosis not present

## 2015-02-10 DIAGNOSIS — Z6826 Body mass index (BMI) 26.0-26.9, adult: Secondary | ICD-10-CM | POA: Diagnosis not present

## 2015-02-10 NOTE — Telephone Encounter (Signed)
Patient has called back in regards to this.  She states she needs a bone density done and she would like to know when she could come to get one and if she could get a copy of her last one .  Patient states she needs both to be done before the 14th.

## 2015-02-10 NOTE — Telephone Encounter (Signed)
dxa has been ordered  [facility] for pt to schedule to have done at her convenience

## 2015-02-11 ENCOUNTER — Telehealth: Payer: Self-pay | Admitting: Internal Medicine

## 2015-02-11 NOTE — Telephone Encounter (Signed)
Please call patient in regards to how long it takes to get DEXA report back and where to send report.

## 2015-02-12 ENCOUNTER — Ambulatory Visit (INDEPENDENT_AMBULATORY_CARE_PROVIDER_SITE_OTHER)
Admission: RE | Admit: 2015-02-12 | Discharge: 2015-02-12 | Disposition: A | Discharge status: Home / Self Care | Payer: Medicare Other | Source: Ambulatory Visit | Attending: Internal Medicine | Admitting: Internal Medicine

## 2015-02-12 DIAGNOSIS — M810 Age-related osteoporosis without current pathological fracture: Secondary | ICD-10-CM

## 2015-02-14 ENCOUNTER — Encounter: Payer: Self-pay | Admitting: Internal Medicine

## 2015-02-16 ENCOUNTER — Ambulatory Visit
Admission: RE | Admit: 2015-02-16 | Discharge: 2015-02-16 | Disposition: A | Discharge status: Home / Self Care | Payer: Medicare Other | Source: Ambulatory Visit | Attending: Neurological Surgery | Admitting: Neurological Surgery

## 2015-02-16 DIAGNOSIS — M4806 Spinal stenosis, lumbar region: Secondary | ICD-10-CM | POA: Diagnosis not present

## 2015-02-16 DIAGNOSIS — M5127 Other intervertebral disc displacement, lumbosacral region: Secondary | ICD-10-CM | POA: Diagnosis not present

## 2015-02-16 DIAGNOSIS — M4120 Other idiopathic scoliosis, site unspecified: Secondary | ICD-10-CM

## 2015-02-16 DIAGNOSIS — M5137 Other intervertebral disc degeneration, lumbosacral region: Secondary | ICD-10-CM | POA: Diagnosis not present

## 2015-02-17 ENCOUNTER — Ambulatory Visit (INDEPENDENT_AMBULATORY_CARE_PROVIDER_SITE_OTHER): Payer: Medicare Other | Admitting: General Practice

## 2015-02-17 DIAGNOSIS — I639 Cerebral infarction, unspecified: Secondary | ICD-10-CM | POA: Diagnosis not present

## 2015-02-17 DIAGNOSIS — Z7901 Long term (current) use of anticoagulants: Secondary | ICD-10-CM

## 2015-02-17 LAB — POCT INR: INR: 1.3

## 2015-02-17 NOTE — Progress Notes (Signed)
Pre visit review using our clinic review tool, if applicable. No additional management support is needed unless otherwise documented below in the visit note. 

## 2015-02-17 NOTE — Progress Notes (Signed)
Agree with plan 

## 2015-02-18 ENCOUNTER — Other Ambulatory Visit: Payer: Self-pay

## 2015-02-18 ENCOUNTER — Encounter: Payer: Self-pay | Admitting: Internal Medicine

## 2015-02-18 MED ORDER — IRBESARTAN 150 MG PO TABS: 150.0000 mg | ORAL_TABLET | Freq: Every morning | ORAL | Status: DC

## 2015-02-18 NOTE — Telephone Encounter (Signed)
Spoke with patient on the phone this morning about her other previous call. She stated during this call that she needed her refills. I have already sent the refills.

## 2015-02-18 NOTE — Telephone Encounter (Signed)
Pt. States that she did not call in on the 9th and ask for DEXA results. She said that she had seen the results on her MyChart. But she did state that she had called this morning and asked for a refill of her BP medication. BP medication has been reordered and sent to [facility] per her request.

## 2015-02-24 ENCOUNTER — Ambulatory Visit (INDEPENDENT_AMBULATORY_CARE_PROVIDER_SITE_OTHER): Payer: Medicare Other | Admitting: General Practice

## 2015-02-24 DIAGNOSIS — I639 Cerebral infarction, unspecified: Secondary | ICD-10-CM

## 2015-02-24 DIAGNOSIS — G459 Transient cerebral ischemic attack, unspecified: Secondary | ICD-10-CM

## 2015-02-24 DIAGNOSIS — Z7901 Long term (current) use of anticoagulants: Secondary | ICD-10-CM

## 2015-02-24 DIAGNOSIS — Z8679 Personal history of other diseases of the circulatory system: Secondary | ICD-10-CM

## 2015-02-24 LAB — POCT INR: INR: 1.4

## 2015-02-24 NOTE — Progress Notes (Signed)
Pre visit review using our clinic review tool, if applicable. No additional management support is needed unless otherwise documented below in the visit note. 

## 2015-02-24 NOTE — Progress Notes (Signed)
Agree with plan 

## 2015-02-28 ENCOUNTER — Encounter: Payer: Self-pay | Admitting: Internal Medicine

## 2015-03-02 ENCOUNTER — Other Ambulatory Visit: Payer: Self-pay | Admitting: *Deleted

## 2015-03-02 MED ORDER — MONTELUKAST SODIUM 10 MG PO TABS: 10.0000 mg | ORAL_TABLET | Freq: Every day | ORAL | Status: DC

## 2015-03-02 NOTE — Telephone Encounter (Signed)
Sent email requesting refill on generic singulair to be sent to [facility]...Johny Chess

## 2015-03-03 ENCOUNTER — Ambulatory Visit: Payer: Medicare Other

## 2015-03-04 ENCOUNTER — Ambulatory Visit (INDEPENDENT_AMBULATORY_CARE_PROVIDER_SITE_OTHER): Payer: Medicare Other | Admitting: General Practice

## 2015-03-04 DIAGNOSIS — Z8679 Personal history of other diseases of the circulatory system: Secondary | ICD-10-CM | POA: Diagnosis not present

## 2015-03-04 DIAGNOSIS — G459 Transient cerebral ischemic attack, unspecified: Secondary | ICD-10-CM

## 2015-03-04 DIAGNOSIS — I639 Cerebral infarction, unspecified: Secondary | ICD-10-CM

## 2015-03-04 DIAGNOSIS — Z7901 Long term (current) use of anticoagulants: Secondary | ICD-10-CM | POA: Diagnosis not present

## 2015-03-04 LAB — POCT INR: INR: 1.5

## 2015-03-04 NOTE — Progress Notes (Signed)
Agree with plan 

## 2015-03-04 NOTE — Progress Notes (Signed)
Pre visit review using our clinic review tool, if applicable. No additional management support is needed unless otherwise documented below in the visit note. 

## 2015-03-09 ENCOUNTER — Other Ambulatory Visit (INDEPENDENT_AMBULATORY_CARE_PROVIDER_SITE_OTHER): Payer: Medicare Other

## 2015-03-09 DIAGNOSIS — I639 Cerebral infarction, unspecified: Secondary | ICD-10-CM

## 2015-03-09 LAB — LIPID PANEL
Cholesterol: 256 mg/dL — ABNORMAL HIGH (ref 0–200)
HDL: 62.1 mg/dL (ref >39.00)
LDL Cholesterol: 178 mg/dL — ABNORMAL HIGH (ref 0–99)
NonHDL: 193.9
Total CHOL/HDL Ratio: 4
Triglycerides: 80 mg/dL (ref 0.0–149.0)
VLDL: 16 mg/dL (ref 0.0–40.0)

## 2015-03-17 DIAGNOSIS — M4806 Spinal stenosis, lumbar region: Secondary | ICD-10-CM | POA: Diagnosis not present

## 2015-03-17 DIAGNOSIS — M4120 Other idiopathic scoliosis, site unspecified: Secondary | ICD-10-CM | POA: Diagnosis not present

## 2015-03-18 ENCOUNTER — Ambulatory Visit (INDEPENDENT_AMBULATORY_CARE_PROVIDER_SITE_OTHER): Payer: Medicare Other | Admitting: General Practice

## 2015-03-18 DIAGNOSIS — I639 Cerebral infarction, unspecified: Secondary | ICD-10-CM | POA: Diagnosis not present

## 2015-03-18 DIAGNOSIS — Z7901 Long term (current) use of anticoagulants: Secondary | ICD-10-CM | POA: Diagnosis not present

## 2015-03-18 LAB — POCT INR: INR: 1.5

## 2015-03-18 NOTE — Progress Notes (Signed)
Pre visit review using our clinic review tool, if applicable. No additional management support is needed unless otherwise documented below in the visit note. 

## 2015-03-18 NOTE — Progress Notes (Signed)
Agree with plan 

## 2015-03-19 DIAGNOSIS — Z961 Presence of intraocular lens: Secondary | ICD-10-CM | POA: Diagnosis not present

## 2015-03-19 DIAGNOSIS — H04123 Dry eye syndrome of bilateral lacrimal glands: Secondary | ICD-10-CM | POA: Diagnosis not present

## 2015-03-24 ENCOUNTER — Ambulatory Visit: Payer: Medicare Other | Admitting: Pharmacist

## 2015-03-25 ENCOUNTER — Ambulatory Visit (INDEPENDENT_AMBULATORY_CARE_PROVIDER_SITE_OTHER): Payer: Medicare Other | Admitting: Pharmacist

## 2015-03-25 DIAGNOSIS — E785 Hyperlipidemia, unspecified: Secondary | ICD-10-CM | POA: Diagnosis not present

## 2015-03-25 DIAGNOSIS — I639 Cerebral infarction, unspecified: Secondary | ICD-10-CM

## 2015-03-31 ENCOUNTER — Ambulatory Visit (INDEPENDENT_AMBULATORY_CARE_PROVIDER_SITE_OTHER): Payer: Medicare Other | Admitting: General Practice

## 2015-03-31 ENCOUNTER — Other Ambulatory Visit: Payer: Self-pay | Admitting: General Practice

## 2015-03-31 DIAGNOSIS — Z8679 Personal history of other diseases of the circulatory system: Secondary | ICD-10-CM | POA: Diagnosis not present

## 2015-03-31 DIAGNOSIS — G459 Transient cerebral ischemic attack, unspecified: Secondary | ICD-10-CM

## 2015-03-31 DIAGNOSIS — Z7901 Long term (current) use of anticoagulants: Secondary | ICD-10-CM | POA: Diagnosis not present

## 2015-03-31 DIAGNOSIS — I639 Cerebral infarction, unspecified: Secondary | ICD-10-CM

## 2015-03-31 LAB — POCT INR: INR: 2

## 2015-03-31 MED ORDER — WARFARIN SODIUM 5 MG PO TABS: ORAL_TABLET | ORAL | Status: DC

## 2015-03-31 NOTE — Progress Notes (Signed)
Agree with plan 

## 2015-03-31 NOTE — Progress Notes (Signed)
Pre visit review using our clinic review tool, if applicable. No additional management support is needed unless otherwise documented below in the visit note. 

## 2015-03-31 NOTE — Progress Notes (Signed)
HPI  [name] is a 74 yo pt of Dr. Harrington Challenger who was referred to the [facility] due to multiple medication intolerances.  Her PMH is significant for CVA in 2001.    Her list of intolerances includes Crestor 20mg  daily (07/2009-8/201), Lipitor 10mg  (09/2011), Lipitor 20mg  (02/2012), Lipitor 40mg  (09/2012-11/2012), simvastatin 20mg  (01/2013-06/2013), Niaspan 500mg  (08/2011-09/2011).  The statins all caused severe cramps and the Niaspan caused flushing.   RF: CVA, HTN, age Goals: LDL <70, non-HDL <100 Current Meds: None Intolerances: Crestor 20mg , Lipitor 10, 20 and 40mg , simvastatin 20mg , Niaspan 500mg    Diet:  Pt has been following a low carb diet since February.  She reports losing 13 lbs since starting the diet.  For breakfast she will have an [employer]kin[employer] shake or yogurt.  Lunch is a 1/2 cup chicken salad, cottage cheese, stewed tomatoes or a couple of crackers.  For dinner she will have 4 oz of meat and 2 vegetables such as cauliflower, broccoli, carrots, corn, or peas.  She will have an [employer] frozen dinner on occasion.  She usually drinks water with lemon.  Exercise:  Limited due to back pain.  She had in injection in February but this did not help.  She is seeing a Psychologist, sport and exercise next week to discuss surgery.  She does try to walk but she requires assistance.   Labs:   03/2015: TC 256, TG 80, HDL 62, LDL 178 (no therapy) 11/2014: TC 354, TG 412, HDL 58, LDL 241  Current Outpatient Prescriptions  Medication Sig Dispense Refill  . albuterol (PROVENTIL HFA;VENTOLIN HFA) 108 (90 BASE) MCG/ACT inhaler Inhale 1-2 puffs into the lungs every 6 (six) hours as needed for wheezing or shortness of breath. 3 Inhaler 4  . amLODipine (NORVASC) 5 MG tablet Take 1 tablet (5 mg total) by mouth every morning. 90 tablet 3  . azelastine (ASTELIN) 137 MCG/SPRAY nasal spray Place 2 sprays into the nose daily. Use in each nostril as directed 90 mL 1  . budesonide-formoterol (SYMBICORT) 160-4.5 MCG/ACT inhaler Inhale 2 puffs  into the lungs 2 (two) times daily. 3 Inhaler 4  . Calcium Carbonate-Vitamin D (CALCIUM + D PO) Take 1 tablet by mouth 2 (two) times daily.     . citalopram (CELEXA) 10 MG tablet Take 1 tablet (10 mg total) by mouth every morning. 90 tablet 3  . estradiol (CLIMARA - DOSED IN MG/24 HR) 0.0375 mg/24hr patch Place 1 patch (0.0375 mg total) onto the skin once a week. 12 patch 4  . irbesartan (AVAPRO) 150 MG tablet Take 1 tablet (150 mg total) by mouth every morning. 90 tablet 3  . lidocaine (XYLOCAINE JELLY) 2 % jelly Apply 1 application topically as needed. 30 mL 1  . Melatonin 1 MG TABS Take by mouth at bedtime as needed.    . meloxicam (MOBIC) 15 MG tablet Take 1 tablet (15 mg total) by mouth daily as needed for pain. 90 tablet 3  . montelukast (SINGULAIR) 10 MG tablet Take 1 tablet (10 mg total) by mouth daily. 90 tablet 3  . Multiple Vitamin (MULTIVITAMIN) tablet Take 1 tablet by mouth daily.      Marland Kitchen omeprazole (PRILOSEC) 20 MG capsule Take 1 capsule (20 mg total) by mouth 2 (two) times daily before a meal. 180 capsule 3  . Respiratory Therapy Supplies (FLUTTER) DEVI Use 2-3 times per day 1 each 0  . Tiotropium Bromide Monohydrate (SPIRIVA RESPIMAT) 2.5 MCG/ACT AERS Inhale 2 puffs into the lungs daily. 3 Inhaler 4  .  Vitamin Mixture (VITAMIN E COMPLETE PO) Take 1 capsule by mouth daily.    Marland Kitchen warfarin (COUMADIN) 5 MG tablet Take as directed by anticoagulation clinic 180 tablet 1  . [DISCONTINUED] valsartan (DIOVAN) 160 MG tablet Take 1 tablet (160 mg total) by mouth daily. 90 tablet 3   No current facility-administered medications for this visit.   Allergies  Allergen Reactions  . Clarithromycin     REACTION: possible rash but could have been legionarres dz with rash  . Lipitor [Atorvastatin]     MUSCLE SPASMS  . Simvastatin   . Crestor [Rosuvastatin Calcium]     fagitue and joint pain, "NO STATINS"    Assessment and Plan  1.  Hyperlipidemia-  Pt has failed multiple statins and based  on her history, would meet qualifications for PCSK-9.  She uses [facility] with the [facility] to get her medications [phone]).  I have called the pharmacy and as of right now they do not cover PCSK-9 but do update their formulary on a monthly basis.  Discussed this with patient.  Options in the interim include trying Zetia or continuing on her new diet as this has resulted in a 70 pt reduction in her LDL.  Pt would prefer to continue working on lifestyle improvements and re-evaluate in 3 months.  We have set her up with repeat labs in August.

## 2015-04-08 DIAGNOSIS — M5416 Radiculopathy, lumbar region: Secondary | ICD-10-CM | POA: Diagnosis not present

## 2015-04-08 DIAGNOSIS — M4806 Spinal stenosis, lumbar region: Secondary | ICD-10-CM | POA: Diagnosis not present

## 2015-04-08 DIAGNOSIS — M545 Low back pain: Secondary | ICD-10-CM | POA: Diagnosis not present

## 2015-04-08 DIAGNOSIS — M4120 Other idiopathic scoliosis, site unspecified: Secondary | ICD-10-CM | POA: Diagnosis not present

## 2015-04-23 ENCOUNTER — Ambulatory Visit (INDEPENDENT_AMBULATORY_CARE_PROVIDER_SITE_OTHER): Payer: Medicare Other | Admitting: General Practice

## 2015-04-23 DIAGNOSIS — Z8679 Personal history of other diseases of the circulatory system: Secondary | ICD-10-CM

## 2015-04-23 DIAGNOSIS — G459 Transient cerebral ischemic attack, unspecified: Secondary | ICD-10-CM | POA: Diagnosis not present

## 2015-04-23 DIAGNOSIS — I639 Cerebral infarction, unspecified: Secondary | ICD-10-CM

## 2015-04-23 DIAGNOSIS — Z7901 Long term (current) use of anticoagulants: Secondary | ICD-10-CM

## 2015-04-23 LAB — POCT INR: INR: 2.4

## 2015-04-23 NOTE — Progress Notes (Signed)
Pre visit review using our clinic review tool, if applicable. No additional management support is needed unless otherwise documented below in the visit note. 

## 2015-04-28 ENCOUNTER — Ambulatory Visit: Payer: Medicare Other

## 2015-05-20 ENCOUNTER — Ambulatory Visit (INDEPENDENT_AMBULATORY_CARE_PROVIDER_SITE_OTHER): Payer: Medicare Other | Admitting: *Deleted

## 2015-05-20 DIAGNOSIS — Z8679 Personal history of other diseases of the circulatory system: Secondary | ICD-10-CM

## 2015-05-20 DIAGNOSIS — I639 Cerebral infarction, unspecified: Secondary | ICD-10-CM

## 2015-05-20 DIAGNOSIS — Z7901 Long term (current) use of anticoagulants: Secondary | ICD-10-CM | POA: Diagnosis not present

## 2015-05-20 DIAGNOSIS — G459 Transient cerebral ischemic attack, unspecified: Secondary | ICD-10-CM | POA: Diagnosis not present

## 2015-05-20 LAB — POCT INR: INR: 2.8

## 2015-05-20 NOTE — Progress Notes (Signed)
Pre visit review using our clinic review tool, if applicable. No additional management support is needed unless otherwise documented below in the visit note. 

## 2015-05-20 NOTE — Progress Notes (Signed)
I have reviewed and agree with the plan. 

## 2015-05-21 DIAGNOSIS — B351 Tinea unguium: Secondary | ICD-10-CM | POA: Diagnosis not present

## 2015-05-27 ENCOUNTER — Other Ambulatory Visit: Payer: Self-pay | Admitting: Neurosurgery

## 2015-05-27 ENCOUNTER — Telehealth: Payer: Self-pay | Admitting: General Practice

## 2015-05-27 DIAGNOSIS — M545 Low back pain: Secondary | ICD-10-CM | POA: Diagnosis not present

## 2015-05-27 DIAGNOSIS — M4806 Spinal stenosis, lumbar region: Secondary | ICD-10-CM | POA: Diagnosis not present

## 2015-05-27 DIAGNOSIS — M5416 Radiculopathy, lumbar region: Secondary | ICD-10-CM | POA: Diagnosis not present

## 2015-05-27 DIAGNOSIS — M4120 Other idiopathic scoliosis, site unspecified: Secondary | ICD-10-CM | POA: Diagnosis not present

## 2015-05-27 NOTE — Telephone Encounter (Signed)
Patient would like to talk to you, her surgery has been schedule,and she has question on when to stop taking her coumadin.

## 2015-05-28 ENCOUNTER — Other Ambulatory Visit: Payer: Self-pay | Admitting: General Practice

## 2015-05-28 ENCOUNTER — Telehealth: Payer: Self-pay | Admitting: General Practice

## 2015-05-28 MED ORDER — ENOXAPARIN SODIUM 100 MG/ML ~~LOC~~ SOLN: 100.0000 mg | SUBCUTANEOUS | Status: DC

## 2015-05-28 NOTE — Telephone Encounter (Signed)
-----   Message from Biagio Borg, MD sent at 05/28/2015 10:09 AM EDT ----- Regarding: RE: Lovenox bridge OK ----- Message -----    From: Warden Fillers, RN    Sent: 05/28/2015   8:21 AM      To: Biagio Borg, MD Subject: Lovenox bridge                                 Hi Dr. Jenny Reichmann,  Patient is having back surgery on 7/29.  I just need the "OK" to order the Lovenox injections.   Thanks, [place] Herb

## 2015-05-29 ENCOUNTER — Encounter: Payer: Self-pay | Admitting: *Deleted

## 2015-05-29 NOTE — Progress Notes (Signed)
Clearance form for [facility] signed by Dr. Harrington Challenger "Pt with hx of CVA, atrial septal aneurysm will need bridging with lovenox prior/after." Placed in nurse fax bin in medical records.

## 2015-06-17 ENCOUNTER — Ambulatory Visit (INDEPENDENT_AMBULATORY_CARE_PROVIDER_SITE_OTHER): Payer: Medicare Other | Admitting: General Practice

## 2015-06-17 DIAGNOSIS — Z8679 Personal history of other diseases of the circulatory system: Secondary | ICD-10-CM | POA: Diagnosis not present

## 2015-06-17 DIAGNOSIS — Z7901 Long term (current) use of anticoagulants: Secondary | ICD-10-CM

## 2015-06-17 DIAGNOSIS — G459 Transient cerebral ischemic attack, unspecified: Secondary | ICD-10-CM | POA: Diagnosis not present

## 2015-06-17 DIAGNOSIS — I639 Cerebral infarction, unspecified: Secondary | ICD-10-CM | POA: Diagnosis not present

## 2015-06-17 LAB — POCT INR: INR: 2.9

## 2015-06-17 NOTE — Patient Instructions (Addendum)
7/22 - Take last dose of coumadin 7/23 - Nothing 7/24 - Lovenox in AM 7/25 - Lovenox in AM 7/26 - Lovenox in AM 7/27 - Lovenox in AM 7/28 - Lovenox in AM 7/29 - Procedure (No Lovenox)

## 2015-06-17 NOTE — Progress Notes (Signed)
I have reviewed and agree with the plan. 

## 2015-06-17 NOTE — Progress Notes (Signed)
Pre visit review using our clinic review tool, if applicable. No additional management support is needed unless otherwise documented below in the visit note. 

## 2015-06-24 ENCOUNTER — Encounter (HOSPITAL_COMMUNITY): Payer: Self-pay | Admitting: Pharmacy Technician

## 2015-06-24 NOTE — Progress Notes (Addendum)
Anesthesia Chart Review: Patient is a 74 year old female scheduled for right L1-2, L2-3, L3-4, L4-5 ALIF, L1-S1 bilateral percutaneous pedicle screws on 07/03/15 by Dr. Vertell Limber. OR room is booked for just over 7 hours.  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 PCP is Dr. Cathlean Cower who medically cleared patient for this procedure. Pulmonologist is Dr. Asencion Noble, last visit 01/06/15.  Cardiologist is Dr. Dorris Carnes (seen for lipid management), last visit 01/05/15. Per Dr. Harrington Challenger: "Pt with hx of CVA, atrial septal aneurysm will need bridging with lovenox prior/after."  Meds include albuterol, amlodipine, Astelin, Symbicort, Celexa, Coumadin (Lovenox bridge while on hold), estradiol patch, irbesartan, Melatonin, Mobic, Singulair, Prilosec, Spiriva.   12/02/14 EKG: SR, frequent multiform ectopic ventricular beats, diffuse low voltage, LAE, anteroseptal infarct (age undetermined), intraventricular conduction delay (may be secondary to infarct). Isolated Q wave in lead III, otherwise not significantly changed since 02/18/12 EKG.   11/25/02 TEE: SUMMARY: - Overall left ventricular systolic function was normal. Left    ventricular ejection fraction was estimated , range being 55    % to 65 %. There were no left ventricular regional wall    motion abnormalities. - There was trivial aortic valvular regurgitation. 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 - There was mild mitral valvular regurgitation. - No intra-atrial septal defect noted by color. Negative saline    microcavitaiton study. There was no left atrial appendage    thrombus identified. There was an atrial septal aneurysm.  11/17/14 CXR: IMPRESSION: Pleural parenchymal scarring, no acute abnormality.  12/28/11 PFTs: FVC 2.89 (104%), FEV1 1.22  (62%), FEV1/FVC 71% (42%), FEF25-75% 0.33 (15%), TLC 6.19 (129%), DLCO 13.6 (69%).     Labs and additional follow-up at her PAT visit.  [facility]/Anesthesiology Phone [phone] 06/24/2015 5:07 PM  Addendum: I reviewed above with anesthesiologist Dr. Tobias Alexander. If patient is doing well from a cardiopulmonary standpoint then it is anticipated that she can proceed as planned.  I did evaluated patient during her PAT visit. She has been off home O2 since 11/2014. She noticed a great improvement after being started on Symbicort. She denies SOB at rest and does not feel her activity is limited from any breathing issues. She walks on the treadmill for 2 miles or uses the elliptical for 30 minutes 3X/week. She has not had any chest pain. No syncope or palpitations. Exam showed a pleasant Caucasian female in NAD. Lungs clear. Heart RRR, no murmur noted. No LE edema. She said she thinks she may have had a previous stress test with Dr. Harrington Challenger, but if so it was 2012 or later. She is scheduled to start Lovenox on 06/28/15 with last dose before surgery on 07/02/15. She will get PT/PTT on the day of surgery. Labs today noted.   If no acute changes and same day labs are acceptable then I would anticipate that she could proceed as planned.  [facility] Stay [facility]/Anesthesiology Phone [phone] 06/25/2015 12:07 PM

## 2015-06-24 NOTE — Pre-Procedure Instructions (Signed)
    ANJULIE DIPIERRO  06/24/2015      [id number] - [place], [place] - [address] [place] [address] Phone: [phone] Fax: [phone]  [place], [place] [address] Phone: [phone] Fax: [phone]    Your procedure is scheduled on Friday, July 29th, 2016 at 7:30 AM.  Report to [place] at 5:30 A.M.  Call this number if you have problems the morning of surgery:  [phone]   Remember:  Do not eat food or drink liquids after midnight.   Take these medicines the morning of surgery with A SIP OF WATER: Albuterol inhaler if needed, Amlodipine (Norvasc), Azelastine (Astelin) nasal spray, Budesonide-Formoterol (Symbicort) inhaler, Citalopram (Celexa), Montelukast (Singulair), Omeprazole (Prilosec), Spiriva.    Stop taking: Coumadin, Aspirin, Ibuprofen, Aleve, Naproxen, NSAIDS, BC's, Goody's, Fish Oil, all herbal medication and all vitamins.    Do not wear jewelry, make-up or nail polish.  Do not wear lotions, powders, or perfumes.  You may NOT wear deodorant.  Do not shave 48 hours prior to surgery.    Do not bring valuables to the hospital.  [facility] is not responsible for any belongings or valuables.  Contacts, dentures or bridgework may not be worn into surgery.  Leave your suitcase in the car.  After surgery it may be brought to your room.  For patients admitted to the hospital, discharge time will be determined by your treatment team.  Patients discharged the day of surgery will not be allowed to drive home.   Special instructions:  See attached.   Please read over the following fact sheets that you were given. Pain Booklet, Coughing and Deep Breathing, Blood Transfusion Information, MRSA Information and Surgical Site Infection Prevention

## 2015-06-25 ENCOUNTER — Encounter (HOSPITAL_COMMUNITY)
Admission: RE | Admit: 2015-06-25 | Discharge: 2015-06-25 | Disposition: A | Discharge status: Home / Self Care | Payer: Medicare Other | Source: Ambulatory Visit | Attending: Neurosurgery | Admitting: Neurosurgery

## 2015-06-25 ENCOUNTER — Encounter (HOSPITAL_COMMUNITY): Payer: Self-pay

## 2015-06-25 DIAGNOSIS — E785 Hyperlipidemia, unspecified: Secondary | ICD-10-CM | POA: Diagnosis not present

## 2015-06-25 DIAGNOSIS — K219 Gastro-esophageal reflux disease without esophagitis: Secondary | ICD-10-CM | POA: Insufficient documentation

## 2015-06-25 DIAGNOSIS — Z01812 Encounter for preprocedural laboratory examination: Secondary | ICD-10-CM | POA: Diagnosis not present

## 2015-06-25 DIAGNOSIS — I10 Essential (primary) hypertension: Secondary | ICD-10-CM | POA: Insufficient documentation

## 2015-06-25 DIAGNOSIS — Z79899 Other long term (current) drug therapy: Secondary | ICD-10-CM | POA: Diagnosis not present

## 2015-06-25 DIAGNOSIS — Z87891 Personal history of nicotine dependence: Secondary | ICD-10-CM | POA: Insufficient documentation

## 2015-06-25 DIAGNOSIS — Z01818 Encounter for other preprocedural examination: Secondary | ICD-10-CM | POA: Insufficient documentation

## 2015-06-25 DIAGNOSIS — Z8673 Personal history of transient ischemic attack (TIA), and cerebral infarction without residual deficits: Secondary | ICD-10-CM | POA: Insufficient documentation

## 2015-06-25 DIAGNOSIS — Z7901 Long term (current) use of anticoagulants: Secondary | ICD-10-CM | POA: Insufficient documentation

## 2015-06-25 DIAGNOSIS — Z0183 Encounter for blood typing: Secondary | ICD-10-CM | POA: Diagnosis not present

## 2015-06-25 DIAGNOSIS — Z7951 Long term (current) use of inhaled steroids: Secondary | ICD-10-CM | POA: Insufficient documentation

## 2015-06-25 DIAGNOSIS — J449 Chronic obstructive pulmonary disease, unspecified: Secondary | ICD-10-CM | POA: Diagnosis not present

## 2015-06-25 HISTORY — DX: Anxiety disorder, unspecified: F41.9

## 2015-06-25 HISTORY — DX: Personal history of urinary calculi: Z87.442

## 2015-06-25 LAB — BASIC METABOLIC PANEL
Anion gap: 9 (ref 5–15)
BUN: 34 mg/dL — ABNORMAL HIGH (ref 6–20)
CO2: 24 mmol/L (ref 22–32)
Calcium: 9.5 mg/dL (ref 8.9–10.3)
Chloride: 106 mmol/L (ref 101–111)
Creatinine, Ser: 1 mg/dL (ref 0.44–1.00)
GFR calc Af Amer: >60 mL/min (ref >60)
GFR calc non Af Amer: 54 mL/min — ABNORMAL LOW (ref >60)
Glucose, Bld: 107 mg/dL — ABNORMAL HIGH (ref 65–99)
Potassium: 4.5 mmol/L (ref 3.5–5.1)
Sodium: 139 mmol/L (ref 135–145)

## 2015-06-25 LAB — CBC
HCT: 40.5 % (ref 36.0–46.0)
Hemoglobin: 13.2 g/dL (ref 12.0–15.0)
MCH: 26.2 pg (ref 26.0–34.0)
MCHC: 32.6 g/dL (ref 30.0–36.0)
MCV: 80.4 fL (ref 78.0–100.0)
Platelets: 168 10*3/uL (ref 150–400)
RBC: 5.04 MIL/uL (ref 3.87–5.11)
RDW: 15 % (ref 11.5–15.5)
WBC: 4.3 10*3/uL (ref 4.0–10.5)

## 2015-06-25 LAB — SURGICAL PCR SCREEN
MRSA, PCR: NEGATIVE
Staphylococcus aureus: NEGATIVE

## 2015-06-25 LAB — ABO/RH: ABO/RH(D): O POS

## 2015-06-25 LAB — TYPE AND SCREEN
ABO/RH(D): O POS
Antibody Screen: NEGATIVE

## 2015-06-25 NOTE — Progress Notes (Signed)
PCP- Dr. Cathlean Cower Cardiologist - Dr. Dorris Carnes  EKG- 11/2014 - Epic CXR - 11/2014 - Epic Echo- 11/2002 - Epic Stress Test - Patient states she had a stress test done in 2012 or before.    Pt. States that she will stop her Coumadin on Friday, July 22nd and begin taking Lovenox injections on Sunday, July 24th.  Patient also states that she will not use Lovenox on the day of surgery.  Patient states she no longer uses oxygen at night and has not needed it for the past year and that her COPD has been better managed since she was started on new medications and inhalers in December.    Patient was seen by Ebony Hail, [place] during PAT appointment.

## 2015-07-02 MED ORDER — CEFAZOLIN SODIUM-DEXTROSE 2-3 GM-% IV SOLR
2.0000 g | INTRAVENOUS | Status: AC
  Administered 2015-07-03 (×2): 2 g via INTRAVENOUS
  Filled 2015-07-02: qty 50

## 2015-07-03 ENCOUNTER — Inpatient Hospital Stay (HOSPITAL_COMMUNITY): Payer: Medicare Other | Admitting: Vascular Surgery

## 2015-07-03 ENCOUNTER — Inpatient Hospital Stay (HOSPITAL_COMMUNITY): Payer: Medicare Other | Admitting: Anesthesiology

## 2015-07-03 ENCOUNTER — Inpatient Hospital Stay (HOSPITAL_COMMUNITY): Payer: Medicare Other

## 2015-07-03 ENCOUNTER — Encounter (HOSPITAL_COMMUNITY): Payer: Self-pay | Admitting: *Deleted

## 2015-07-03 ENCOUNTER — Encounter (HOSPITAL_COMMUNITY)
Admission: RE | Disposition: A | Discharge status: Skilled Nursing Facility | Payer: Medicare Other | Source: Ambulatory Visit | Attending: Neurosurgery

## 2015-07-03 ENCOUNTER — Inpatient Hospital Stay (HOSPITAL_COMMUNITY)
Admission: RE | Admit: 2015-07-03 | Discharge: 2015-07-10 | DRG: 455 | Disposition: A | Discharge status: Skilled Nursing Facility | Payer: Medicare Other | Source: Ambulatory Visit | Attending: Neurosurgery | Admitting: Neurosurgery

## 2015-07-03 DIAGNOSIS — M4727 Other spondylosis with radiculopathy, lumbosacral region: Principal | ICD-10-CM | POA: Diagnosis present

## 2015-07-03 DIAGNOSIS — Z79899 Other long term (current) drug therapy: Secondary | ICD-10-CM

## 2015-07-03 DIAGNOSIS — M4327 Fusion of spine, lumbosacral region: Secondary | ICD-10-CM | POA: Diagnosis not present

## 2015-07-03 DIAGNOSIS — M5416 Radiculopathy, lumbar region: Secondary | ICD-10-CM | POA: Diagnosis not present

## 2015-07-03 DIAGNOSIS — M4326 Fusion of spine, lumbar region: Secondary | ICD-10-CM | POA: Diagnosis not present

## 2015-07-03 DIAGNOSIS — Z419 Encounter for procedure for purposes other than remedying health state, unspecified: Secondary | ICD-10-CM

## 2015-07-03 DIAGNOSIS — M41126 Adolescent idiopathic scoliosis, lumbar region: Secondary | ICD-10-CM | POA: Diagnosis not present

## 2015-07-03 DIAGNOSIS — R0603 Acute respiratory distress: Secondary | ICD-10-CM

## 2015-07-03 DIAGNOSIS — M4806 Spinal stenosis, lumbar region: Secondary | ICD-10-CM | POA: Diagnosis present

## 2015-07-03 DIAGNOSIS — J9811 Atelectasis: Secondary | ICD-10-CM | POA: Diagnosis not present

## 2015-07-03 DIAGNOSIS — J188 Other pneumonia, unspecified organism: Secondary | ICD-10-CM | POA: Diagnosis not present

## 2015-07-03 DIAGNOSIS — M545 Low back pain: Secondary | ICD-10-CM | POA: Diagnosis present

## 2015-07-03 DIAGNOSIS — J449 Chronic obstructive pulmonary disease, unspecified: Secondary | ICD-10-CM | POA: Diagnosis present

## 2015-07-03 DIAGNOSIS — M419 Scoliosis, unspecified: Secondary | ICD-10-CM | POA: Diagnosis present

## 2015-07-03 DIAGNOSIS — R0989 Other specified symptoms and signs involving the circulatory and respiratory systems: Secondary | ICD-10-CM | POA: Diagnosis not present

## 2015-07-03 DIAGNOSIS — M4316 Spondylolisthesis, lumbar region: Secondary | ICD-10-CM | POA: Diagnosis present

## 2015-07-03 DIAGNOSIS — M4126 Other idiopathic scoliosis, lumbar region: Secondary | ICD-10-CM | POA: Diagnosis present

## 2015-07-03 DIAGNOSIS — J942 Hemothorax: Secondary | ICD-10-CM

## 2015-07-03 DIAGNOSIS — Z4889 Encounter for other specified surgical aftercare: Secondary | ICD-10-CM | POA: Diagnosis not present

## 2015-07-03 DIAGNOSIS — M549 Dorsalgia, unspecified: Secondary | ICD-10-CM | POA: Diagnosis not present

## 2015-07-03 HISTORY — PX: LUMBAR PERCUTANEOUS PEDICLE SCREW 4 LEVEL: SHX6318

## 2015-07-03 HISTORY — PX: ANTERIOR LATERAL LUMBAR FUSION 4 LEVELS: SHX5552

## 2015-07-03 LAB — PROTIME-INR
INR: 1.01 (ref 0.00–1.49)
Prothrombin Time: 13.5 seconds (ref 11.6–15.2)

## 2015-07-03 LAB — APTT: aPTT: 27 seconds (ref 24–37)

## 2015-07-03 SURGERY — ANTERIOR LATERAL LUMBAR FUSION 4 LEVELS
Anesthesia: General | Site: Flank | Laterality: Right

## 2015-07-03 MED ORDER — MIDAZOLAM HCL 5 MG/5ML IJ SOLN
INTRAMUSCULAR | Status: DC | PRN
  Administered 2015-07-03 (×2): 1 mg via INTRAVENOUS

## 2015-07-03 MED ORDER — THROMBIN 5000 UNITS EX SOLR
CUTANEOUS | Status: DC | PRN
  Administered 2015-07-03 (×2): 5000 [IU] via TOPICAL

## 2015-07-03 MED ORDER — MAGNESIUM OXIDE 400 (241.3 MG) MG PO TABS
200.0000 mg | ORAL_TABLET | Freq: Every day | ORAL | Status: DC | PRN
  Filled 2015-07-03: qty 0.5

## 2015-07-03 MED ORDER — PANTOPRAZOLE SODIUM 40 MG PO TBEC
40.0000 mg | DELAYED_RELEASE_TABLET | Freq: Every day | ORAL | Status: DC
  Administered 2015-07-04 – 2015-07-10 (×7): 40 mg via ORAL
  Filled 2015-07-03 (×7): qty 1

## 2015-07-03 MED ORDER — METHOCARBAMOL 500 MG PO TABS
500.0000 mg | ORAL_TABLET | Freq: Four times a day (QID) | ORAL | Status: DC | PRN
  Administered 2015-07-05 – 2015-07-10 (×9): 500 mg via ORAL
  Filled 2015-07-03 (×9): qty 1

## 2015-07-03 MED ORDER — ESTRADIOL 0.0375 MG/24HR TD PTWK
0.0375 mg | MEDICATED_PATCH | TRANSDERMAL | Status: DC
Start: 2015-07-03 — End: 2015-07-03

## 2015-07-03 MED ORDER — KCL IN DEXTROSE-NACL 20-5-0.45 MEQ/L-%-% IV SOLN
INTRAVENOUS | Status: DC
  Administered 2015-07-03: 18:00:00 via INTRAVENOUS
  Administered 2015-07-04: 75 mL/h via INTRAVENOUS
  Administered 2015-07-10: 03:00:00 via INTRAVENOUS
  Filled 2015-07-03 (×4): qty 1000

## 2015-07-03 MED ORDER — MONTELUKAST SODIUM 10 MG PO TABS
10.0000 mg | ORAL_TABLET | Freq: Every day | ORAL | Status: DC
  Administered 2015-07-05 – 2015-07-10 (×6): 10 mg via ORAL
  Filled 2015-07-03 (×7): qty 1

## 2015-07-03 MED ORDER — PHENYLEPHRINE HCL 10 MG/ML IJ SOLN
10.0000 mg | INTRAVENOUS | Status: DC | PRN
  Administered 2015-07-03: 20 ug/min via INTRAVENOUS

## 2015-07-03 MED ORDER — DOCUSATE SODIUM 100 MG PO CAPS
100.0000 mg | ORAL_CAPSULE | Freq: Two times a day (BID) | ORAL | Status: DC
  Administered 2015-07-03 – 2015-07-10 (×14): 100 mg via ORAL
  Filled 2015-07-03 (×16): qty 1

## 2015-07-03 MED ORDER — IRBESARTAN 150 MG PO TABS
150.0000 mg | ORAL_TABLET | Freq: Every morning | ORAL | Status: DC
  Administered 2015-07-05 – 2015-07-08 (×2): 150 mg via ORAL
  Filled 2015-07-03 (×4): qty 1

## 2015-07-03 MED ORDER — ONDANSETRON HCL 4 MG/2ML IJ SOLN
INTRAMUSCULAR | Status: AC
  Filled 2015-07-03: qty 2

## 2015-07-03 MED ORDER — PHENYLEPHRINE 40 MCG/ML (10ML) SYRINGE FOR IV PUSH (FOR BLOOD PRESSURE SUPPORT)
PREFILLED_SYRINGE | INTRAVENOUS | Status: AC
  Filled 2015-07-03: qty 10

## 2015-07-03 MED ORDER — PHENOL 1.4 % MT LIQD: 1.0000 | OROMUCOSAL | Status: DC | PRN

## 2015-07-03 MED ORDER — PROPOFOL 10 MG/ML IV BOLUS
INTRAVENOUS | Status: DC | PRN
  Administered 2015-07-03: 30 mg via INTRAVENOUS
  Administered 2015-07-03: 20 mg via INTRAVENOUS
  Administered 2015-07-03: 80 mg via INTRAVENOUS
  Administered 2015-07-03: 50 mg via INTRAVENOUS

## 2015-07-03 MED ORDER — SODIUM CHLORIDE 0.9 % IJ SOLN
3.0000 mL | Freq: Two times a day (BID) | INTRAMUSCULAR | Status: DC
  Administered 2015-07-03 – 2015-07-09 (×5): 3 mL via INTRAVENOUS

## 2015-07-03 MED ORDER — BISACODYL 10 MG RE SUPP
10.0000 mg | Freq: Every day | RECTAL | Status: DC | PRN
  Administered 2015-07-09: 10 mg via RECTAL
  Filled 2015-07-03: qty 1

## 2015-07-03 MED ORDER — ONDANSETRON HCL 4 MG/2ML IJ SOLN
INTRAMUSCULAR | Status: DC | PRN
  Administered 2015-07-03: 4 mg via INTRAVENOUS

## 2015-07-03 MED ORDER — METHOCARBAMOL 1000 MG/10ML IJ SOLN
500.0000 mg | Freq: Four times a day (QID) | INTRAVENOUS | Status: DC | PRN
  Administered 2015-07-03 – 2015-07-04 (×2): 500 mg via INTRAVENOUS
  Filled 2015-07-03 (×2): qty 5

## 2015-07-03 MED ORDER — MIDAZOLAM HCL 2 MG/2ML IJ SOLN
INTRAMUSCULAR | Status: AC
  Filled 2015-07-03: qty 2

## 2015-07-03 MED ORDER — MENTHOL 3 MG MT LOZG: 1.0000 | LOZENGE | OROMUCOSAL | Status: DC | PRN

## 2015-07-03 MED ORDER — SUCCINYLCHOLINE CHLORIDE 20 MG/ML IJ SOLN
INTRAMUSCULAR | Status: DC | PRN
  Administered 2015-07-03: 90 mg via INTRAVENOUS

## 2015-07-03 MED ORDER — ARTIFICIAL TEARS OP OINT
TOPICAL_OINTMENT | OPHTHALMIC | Status: DC | PRN
Start: 2015-07-03 — End: 2015-07-03
  Administered 2015-07-03: 1 via OPHTHALMIC

## 2015-07-03 MED ORDER — LACTATED RINGERS IV SOLN
INTRAVENOUS | Status: DC | PRN
  Administered 2015-07-03: 07:00:00 via INTRAVENOUS

## 2015-07-03 MED ORDER — LIDOCAINE HCL (CARDIAC) 20 MG/ML IV SOLN
INTRAVENOUS | Status: DC | PRN
  Administered 2015-07-03: 60 mg via INTRAVENOUS

## 2015-07-03 MED ORDER — DEXAMETHASONE SODIUM PHOSPHATE 4 MG/ML IJ SOLN
INTRAMUSCULAR | Status: AC
  Filled 2015-07-03: qty 2

## 2015-07-03 MED ORDER — FENTANYL CITRATE (PF) 250 MCG/5ML IJ SOLN
INTRAMUSCULAR | Status: AC
  Filled 2015-07-03: qty 5

## 2015-07-03 MED ORDER — HYDROCODONE-ACETAMINOPHEN 5-325 MG PO TABS
1.0000 | ORAL_TABLET | ORAL | Status: DC | PRN
  Administered 2015-07-03 – 2015-07-06 (×2): 2 via ORAL
  Administered 2015-07-06: 1 via ORAL
  Administered 2015-07-09 – 2015-07-10 (×2): 2 via ORAL
  Filled 2015-07-03: qty 1
  Filled 2015-07-03 (×5): qty 2

## 2015-07-03 MED ORDER — PANTOPRAZOLE SODIUM 40 MG IV SOLR: 40.0000 mg | Freq: Every day | INTRAVENOUS | Status: DC

## 2015-07-03 MED ORDER — SODIUM CHLORIDE 0.9 % IJ SOLN: 3.0000 mL | INTRAMUSCULAR | Status: DC | PRN

## 2015-07-03 MED ORDER — POLYETHYLENE GLYCOL 3350 17 G PO PACK
17.0000 g | PACK | Freq: Every day | ORAL | Status: DC | PRN
  Administered 2015-07-09: 17 g via ORAL
  Filled 2015-07-03 (×3): qty 1

## 2015-07-03 MED ORDER — EPHEDRINE SULFATE 50 MG/ML IJ SOLN
INTRAMUSCULAR | Status: DC | PRN
  Administered 2015-07-03: 15 mg via INTRAVENOUS
  Administered 2015-07-03 (×2): 10 mg via INTRAVENOUS

## 2015-07-03 MED ORDER — SODIUM CHLORIDE 0.9 % IJ SOLN
INTRAMUSCULAR | Status: AC
  Filled 2015-07-03: qty 10

## 2015-07-03 MED ORDER — ONDANSETRON HCL 4 MG/2ML IJ SOLN
4.0000 mg | INTRAMUSCULAR | Status: DC | PRN
Start: 2015-07-03 — End: 2015-07-10
  Administered 2015-07-03: 4 mg via INTRAVENOUS
  Filled 2015-07-03: qty 2

## 2015-07-03 MED ORDER — ACETAMINOPHEN 650 MG RE SUPP: 650.0000 mg | RECTAL | Status: DC | PRN

## 2015-07-03 MED ORDER — DEXAMETHASONE SODIUM PHOSPHATE 4 MG/ML IJ SOLN
INTRAMUSCULAR | Status: DC | PRN
  Administered 2015-07-03: 8 mg via INTRAVENOUS

## 2015-07-03 MED ORDER — CEFAZOLIN SODIUM-DEXTROSE 2-3 GM-% IV SOLR
INTRAVENOUS | Status: AC
  Filled 2015-07-03: qty 50

## 2015-07-03 MED ORDER — ARTIFICIAL TEARS OP OINT
TOPICAL_OINTMENT | OPHTHALMIC | Status: AC
Start: 2015-07-03 — End: 2015-07-03
  Filled 2015-07-03: qty 3.5

## 2015-07-03 MED ORDER — ACETAMINOPHEN 10 MG/ML IV SOLN
INTRAVENOUS | Status: AC
  Administered 2015-07-03: 1000 mg via INTRAVENOUS
  Filled 2015-07-03: qty 100

## 2015-07-03 MED ORDER — ALUM & MAG HYDROXIDE-SIMETH 200-200-20 MG/5ML PO SUSP: 30.0000 mL | Freq: Four times a day (QID) | ORAL | Status: DC | PRN

## 2015-07-03 MED ORDER — 0.9 % SODIUM CHLORIDE (POUR BTL) OPTIME
TOPICAL | Status: DC | PRN
  Administered 2015-07-03: 1000 mL

## 2015-07-03 MED ORDER — EPHEDRINE SULFATE 50 MG/ML IJ SOLN
INTRAMUSCULAR | Status: AC
  Filled 2015-07-03: qty 1

## 2015-07-03 MED ORDER — AMLODIPINE BESYLATE 5 MG PO TABS
5.0000 mg | ORAL_TABLET | Freq: Every morning | ORAL | Status: DC
  Administered 2015-07-05 – 2015-07-08 (×2): 5 mg via ORAL
  Filled 2015-07-03 (×4): qty 1

## 2015-07-03 MED ORDER — ROCURONIUM BROMIDE 50 MG/5ML IV SOLN
INTRAVENOUS | Status: AC
  Filled 2015-07-03: qty 1

## 2015-07-03 MED ORDER — GLYCOPYRROLATE 0.2 MG/ML IJ SOLN
INTRAMUSCULAR | Status: DC | PRN
  Administered 2015-07-03: 0.2 mg via INTRAVENOUS

## 2015-07-03 MED ORDER — OXYCODONE-ACETAMINOPHEN 5-325 MG PO TABS
1.0000 | ORAL_TABLET | ORAL | Status: DC | PRN
  Administered 2015-07-03: 1 via ORAL
  Administered 2015-07-03 – 2015-07-04 (×2): 2 via ORAL
  Administered 2015-07-06: 1 via ORAL
  Administered 2015-07-07 – 2015-07-10 (×15): 2 via ORAL
  Filled 2015-07-03 (×6): qty 2
  Filled 2015-07-03: qty 1
  Filled 2015-07-03 (×8): qty 2
  Filled 2015-07-03: qty 1
  Filled 2015-07-03 (×4): qty 2

## 2015-07-03 MED ORDER — HYDROMORPHONE HCL 2 MG PO TABS
2.0000 mg | ORAL_TABLET | ORAL | Status: DC | PRN
  Administered 2015-07-03 – 2015-07-04 (×2): 2 mg via ORAL
  Filled 2015-07-03 (×2): qty 1

## 2015-07-03 MED ORDER — ALBUTEROL SULFATE (2.5 MG/3ML) 0.083% IN NEBU: 2.5000 mg | INHALATION_SOLUTION | Freq: Four times a day (QID) | RESPIRATORY_TRACT | Status: DC | PRN

## 2015-07-03 MED ORDER — FLEET ENEMA 7-19 GM/118ML RE ENEM: 1.0000 | ENEMA | Freq: Once | RECTAL | Status: AC | PRN

## 2015-07-03 MED ORDER — ALBUTEROL SULFATE HFA 108 (90 BASE) MCG/ACT IN AERS: 1.0000 | INHALATION_SPRAY | Freq: Four times a day (QID) | RESPIRATORY_TRACT | Status: DC | PRN

## 2015-07-03 MED ORDER — CALCIUM CARBONATE-VITAMIN D 500-200 MG-UNIT PO TABS
1.0000 | ORAL_TABLET | Freq: Two times a day (BID) | ORAL | Status: DC
  Administered 2015-07-03 – 2015-07-10 (×13): 1 via ORAL
  Filled 2015-07-03 (×15): qty 1

## 2015-07-03 MED ORDER — MELATONIN 1 MG PO TABS: 3.0000 mg | ORAL_TABLET | Freq: Every day | ORAL | Status: DC

## 2015-07-03 MED ORDER — CEFAZOLIN SODIUM-DEXTROSE 2-3 GM-% IV SOLR
2.0000 g | Freq: Three times a day (TID) | INTRAVENOUS | Status: AC
  Administered 2015-07-03 – 2015-07-04 (×2): 2 g via INTRAVENOUS
  Filled 2015-07-03 (×2): qty 50

## 2015-07-03 MED ORDER — SODIUM CHLORIDE 0.9 % IV SOLN
250.0000 mL | INTRAVENOUS | Status: DC
  Administered 2015-07-07: 250 mL via INTRAVENOUS

## 2015-07-03 MED ORDER — LIDOCAINE-EPINEPHRINE 1 %-1:100000 IJ SOLN
INTRAMUSCULAR | Status: DC | PRN
Start: 2015-07-03 — End: 2015-07-03
  Administered 2015-07-03: 7 mL
  Administered 2015-07-03: 12 mL

## 2015-07-03 MED ORDER — PROPOFOL 10 MG/ML IV BOLUS
INTRAVENOUS | Status: AC
  Filled 2015-07-03: qty 20

## 2015-07-03 MED ORDER — PROMETHAZINE HCL 25 MG/ML IJ SOLN: 6.2500 mg | INTRAMUSCULAR | Status: DC | PRN

## 2015-07-03 MED ORDER — BUDESONIDE-FORMOTEROL FUMARATE 160-4.5 MCG/ACT IN AERO
2.0000 | INHALATION_SPRAY | Freq: Two times a day (BID) | RESPIRATORY_TRACT | Status: DC
  Administered 2015-07-03 – 2015-07-10 (×14): 2 via RESPIRATORY_TRACT
  Filled 2015-07-03: qty 6

## 2015-07-03 MED ORDER — LIDOCAINE HCL (CARDIAC) 20 MG/ML IV SOLN
INTRAVENOUS | Status: AC
  Filled 2015-07-03: qty 5

## 2015-07-03 MED ORDER — TIOTROPIUM BROMIDE MONOHYDRATE 18 MCG IN CAPS
1.0000 | ORAL_CAPSULE | Freq: Every day | RESPIRATORY_TRACT | Status: DC
  Administered 2015-07-04 – 2015-07-10 (×6): 18 ug via RESPIRATORY_TRACT
  Filled 2015-07-03 (×3): qty 5

## 2015-07-03 MED ORDER — FENTANYL CITRATE (PF) 100 MCG/2ML IJ SOLN
INTRAMUSCULAR | Status: DC | PRN
  Administered 2015-07-03 (×6): 50 ug via INTRAVENOUS
  Administered 2015-07-03: 100 ug via INTRAVENOUS

## 2015-07-03 MED ORDER — SUCCINYLCHOLINE CHLORIDE 20 MG/ML IJ SOLN
INTRAMUSCULAR | Status: AC
Start: 2015-07-03 — End: 2015-07-03
  Filled 2015-07-03: qty 1

## 2015-07-03 MED ORDER — BUPIVACAINE HCL (PF) 0.5 % IJ SOLN
INTRAMUSCULAR | Status: DC | PRN
  Administered 2015-07-03: 7 mL
  Administered 2015-07-03: 12 mL

## 2015-07-03 MED ORDER — SUCCINYLCHOLINE CHLORIDE 20 MG/ML IJ SOLN
INTRAMUSCULAR | Status: AC
  Filled 2015-07-03: qty 1

## 2015-07-03 MED ORDER — ADULT MULTIVITAMIN W/MINERALS CH
1.0000 | ORAL_TABLET | Freq: Every day | ORAL | Status: DC
  Administered 2015-07-05 – 2015-07-10 (×6): 1 via ORAL
  Filled 2015-07-03 (×8): qty 1

## 2015-07-03 MED ORDER — AZELASTINE HCL 0.1 % NA SOLN
1.0000 | Freq: Two times a day (BID) | NASAL | Status: DC
  Administered 2015-07-04 – 2015-07-09 (×6): 1 via NASAL
  Filled 2015-07-03: qty 30

## 2015-07-03 MED ORDER — HYDROMORPHONE HCL 1 MG/ML IJ SOLN
0.2500 mg | INTRAMUSCULAR | Status: DC | PRN
  Administered 2015-07-03 (×2): 0.5 mg via INTRAVENOUS

## 2015-07-03 MED ORDER — HYDROMORPHONE HCL 1 MG/ML IJ SOLN: 0.5000 mg | INTRAMUSCULAR | Status: DC | PRN

## 2015-07-03 MED ORDER — DIAZEPAM 5 MG PO TABS
5.0000 mg | ORAL_TABLET | Freq: Four times a day (QID) | ORAL | Status: DC | PRN
  Administered 2015-07-03 – 2015-07-08 (×5): 5 mg via ORAL
  Filled 2015-07-03 (×5): qty 1

## 2015-07-03 MED ORDER — LACTATED RINGERS IV SOLN
INTRAVENOUS | Status: DC | PRN
  Administered 2015-07-03 (×2): via INTRAVENOUS

## 2015-07-03 MED ORDER — PROPOFOL INFUSION 10 MG/ML OPTIME
INTRAVENOUS | Status: DC | PRN
  Administered 2015-07-03: 25 ug/kg/min via INTRAVENOUS

## 2015-07-03 MED ORDER — HEMOSTATIC AGENTS (NO CHARGE) OPTIME
TOPICAL | Status: DC | PRN
  Administered 2015-07-03: 1 via TOPICAL

## 2015-07-03 MED ORDER — CITALOPRAM HYDROBROMIDE 10 MG PO TABS
10.0000 mg | ORAL_TABLET | Freq: Every morning | ORAL | Status: DC
  Administered 2015-07-04 – 2015-07-10 (×7): 10 mg via ORAL
  Filled 2015-07-03 (×8): qty 1

## 2015-07-03 MED ORDER — ACETAMINOPHEN 325 MG PO TABS
650.0000 mg | ORAL_TABLET | ORAL | Status: DC | PRN
  Administered 2015-07-04: 650 mg via ORAL
  Filled 2015-07-03: qty 2

## 2015-07-03 MED FILL — Heparin Sodium (Porcine) Inj 1000 Unit/ML: INTRAMUSCULAR | Qty: 30 | Status: AC

## 2015-07-03 MED FILL — Sodium Chloride IV Soln 0.9%: INTRAVENOUS | Qty: 1000 | Status: AC

## 2015-07-03 SURGICAL SUPPLY — 92 items
ADH SKN CLS APL DERMABOND .7 (GAUZE/BANDAGES/DRESSINGS) ×2
ADH SKN CLS LQ APL DERMABOND (GAUZE/BANDAGES/DRESSINGS) ×2
APL SKNCLS STERI-STRIP NONHPOA (GAUZE/BANDAGES/DRESSINGS)
BENZOIN TINCTURE PRP APPL 2/3 (GAUZE/BANDAGES/DRESSINGS) ×2 IMPLANT
BLADE CLIPPER SURG (BLADE) IMPLANT
CLIP NEUROVISION LG (CLIP) ×1 IMPLANT
CONT SPEC 4OZ CLIKSEAL STRL BL (MISCELLANEOUS) ×5 IMPLANT
COROENT XL-W 10X22X50 (Orthopedic Implant) ×1 IMPLANT
COROENT XL-W 8X22X50 (Orthopedic Implant) ×2 IMPLANT
COVER BACK TABLE 24X17X13 BIG (DRAPES) IMPLANT
COVER BACK TABLE 60X90IN (DRAPES) ×3 IMPLANT
DECANTER SPIKE VIAL GLASS SM (MISCELLANEOUS) ×5 IMPLANT
DERMABOND ADHESIVE PROPEN (GAUZE/BANDAGES/DRESSINGS) ×1
DERMABOND ADVANCED (GAUZE/BANDAGES/DRESSINGS) ×1
DERMABOND ADVANCED .7 DNX12 (GAUZE/BANDAGES/DRESSINGS) ×4 IMPLANT
DERMABOND ADVANCED .7 DNX6 (GAUZE/BANDAGES/DRESSINGS) IMPLANT
DIGITIZER BENDINI (MISCELLANEOUS) ×1 IMPLANT
DRAPE C-ARM 42X72 X-RAY (DRAPES) ×6 IMPLANT
DRAPE C-ARMOR (DRAPES) ×6 IMPLANT
DRAPE LAPAROTOMY 100X72X124 (DRAPES) ×6 IMPLANT
DRAPE POUCH INSTRU U-SHP 10X18 (DRAPES) ×6 IMPLANT
DRAPE SURG 17X23 STRL (DRAPES) ×5 IMPLANT
DRSG OPSITE POSTOP 3X4 (GAUZE/BANDAGES/DRESSINGS) ×2 IMPLANT
DRSG TELFA 3X8 NADH (GAUZE/BANDAGES/DRESSINGS) IMPLANT
DURAPREP 26ML APPLICATOR (WOUND CARE) ×6 IMPLANT
ELECT REM PT RETURN 9FT ADLT (ELECTROSURGICAL) ×6
ELECTRODE REM PT RTRN 9FT ADLT (ELECTROSURGICAL) ×4 IMPLANT
GAUZE SPONGE 4X4 12PLY STRL (GAUZE/BANDAGES/DRESSINGS) ×3 IMPLANT
GAUZE SPONGE 4X4 16PLY XRAY LF (GAUZE/BANDAGES/DRESSINGS) ×1 IMPLANT
GLOVE BIO SURGEON STRL SZ8 (GLOVE) ×11 IMPLANT
GLOVE BIOGEL PI IND STRL 8 (GLOVE) ×4 IMPLANT
GLOVE BIOGEL PI IND STRL 8.5 (GLOVE) ×6 IMPLANT
GLOVE BIOGEL PI INDICATOR 8 (GLOVE) ×2
GLOVE BIOGEL PI INDICATOR 8.5 (GLOVE) ×3
GLOVE ECLIPSE 7.5 STRL STRAW (GLOVE) ×1 IMPLANT
GLOVE ECLIPSE 8.0 STRL XLNG CF (GLOVE) ×6 IMPLANT
GLOVE EXAM NITRILE LRG STRL (GLOVE) IMPLANT
GLOVE EXAM NITRILE MD LF STRL (GLOVE) IMPLANT
GLOVE EXAM NITRILE XL STR (GLOVE) IMPLANT
GLOVE EXAM NITRILE XS STR PU (GLOVE) IMPLANT
GOWN STRL REUS W/ TWL LRG LVL3 (GOWN DISPOSABLE) IMPLANT
GOWN STRL REUS W/ TWL XL LVL3 (GOWN DISPOSABLE) ×6 IMPLANT
GOWN STRL REUS W/TWL 2XL LVL3 (GOWN DISPOSABLE) ×4 IMPLANT
GOWN STRL REUS W/TWL LRG LVL3 (GOWN DISPOSABLE)
GOWN STRL REUS W/TWL XL LVL3 (GOWN DISPOSABLE) ×9
GUIDEWIRE NITINOL BEVEL TIP (WIRE) ×11 IMPLANT
IMPLANT COROENT XL 8X45X18MM ×1 IMPLANT
KIT BASIN OR (CUSTOM PROCEDURE TRAY) ×6 IMPLANT
KIT DILATOR XLIF 5 (KITS) IMPLANT
KIT INFUSE MEDIUM (Orthopedic Implant) ×1 IMPLANT
KIT NDL NVM5 EMG ELECT (KITS) IMPLANT
KIT NEEDLE NVM5 EMG ELECT (KITS) ×2 IMPLANT
KIT NEEDLE NVM5 EMG ELECTRODE (KITS) ×1
KIT POSITION SURG JACKSON T1 (MISCELLANEOUS) ×3 IMPLANT
KIT ROOM TURNOVER OR (KITS) ×5 IMPLANT
KIT SURGICAL ACCESS MAXCESS 4 (KITS) ×1 IMPLANT
KIT XLIF (KITS) ×1
MARKER SKIN DUAL TIP RULER LAB (MISCELLANEOUS) ×3 IMPLANT
NDL HYPO 25X1 1.5 SAFETY (NEEDLE) ×4 IMPLANT
NDL I PASS (NEEDLE) IMPLANT
NEEDLE HYPO 25X1 1.5 SAFETY (NEEDLE) ×6 IMPLANT
NEEDLE I PASS (NEEDLE) ×3 IMPLANT
NS IRRIG 1000ML POUR BTL (IV SOLUTION) ×5 IMPLANT
PACK FOAM VITOSS 10CC (Orthopedic Implant) ×2 IMPLANT
PACK LAMINECTOMY NEURO (CUSTOM PROCEDURE TRAY) ×6 IMPLANT
PAD ARMBOARD 7.5X6 YLW CONV (MISCELLANEOUS) ×9 IMPLANT
PAD DRESSING TELFA 3X8 NADH (GAUZE/BANDAGES/DRESSINGS) ×2 IMPLANT
PATTIES SURGICAL .5 X.5 (GAUZE/BANDAGES/DRESSINGS) IMPLANT
PATTIES SURGICAL .5 X1 (DISPOSABLE) IMPLANT
PATTIES SURGICAL 1X1 (DISPOSABLE) IMPLANT
ROD RELINE STR 5.5X160 (Rod) ×2 IMPLANT
SCREW LOCK RELINE 5.5 TULIP (Screw) ×11 IMPLANT
SCREW MAS RELINE 6.5X45 POLY (Screw) ×6 IMPLANT
SCREW MAS RELINE 6.5X50 POLY (Screw) ×5 IMPLANT
SPONGE LAP 4X18 X RAY DECT (DISPOSABLE) IMPLANT
SPONGE SURGIFOAM ABS GEL SZ50 (HEMOSTASIS) ×1 IMPLANT
STAPLER SKIN PROX WIDE 3.9 (STAPLE) ×3 IMPLANT
STRIP CLOSURE SKIN 1/2X4 (GAUZE/BANDAGES/DRESSINGS) ×2 IMPLANT
SUT CHROMIC 3 0 PS 2 (SUTURE) ×1 IMPLANT
SUT CHROMIC 4 0 P 3 18 (SUTURE) ×1 IMPLANT
SUT VIC AB 1 CT1 18XBRD ANBCTR (SUTURE) ×6 IMPLANT
SUT VIC AB 1 CT1 8-18 (SUTURE) ×12
SUT VIC AB 2-0 CT1 18 (SUTURE) ×10 IMPLANT
SUT VIC AB 3-0 SH 8-18 (SUTURE) ×11 IMPLANT
SYR 20ML ECCENTRIC (SYRINGE) ×4 IMPLANT
SYR INSULIN 1ML 31GX6 SAFETY (SYRINGE) IMPLANT
TAPE CLOTH 3X10 TAN LF (GAUZE/BANDAGES/DRESSINGS) ×8 IMPLANT
TAPE CLOTH SURG 4X10 WHT LF (GAUZE/BANDAGES/DRESSINGS) ×1 IMPLANT
TOWEL OR 17X24 6PK STRL BLUE (TOWEL DISPOSABLE) ×4 IMPLANT
TOWEL OR 17X26 10 PK STRL BLUE (TOWEL DISPOSABLE) ×6 IMPLANT
TRAY FOLEY W/METER SILVER 14FR (SET/KITS/TRAYS/PACK) ×5 IMPLANT
WATER STERILE IRR 1000ML POUR (IV SOLUTION) ×6 IMPLANT

## 2015-07-03 NOTE — Progress Notes (Signed)
Pt awakens in  Pain and states "give me pain meds". When meds given pt falls asleep and desats, pt encouraged to deep breathe and cough

## 2015-07-03 NOTE — Op Note (Signed)
07/03/2015  2:20 PM  PATIENT:  [name]  74 y.o. female  PRE-OPERATIVE DIAGNOSIS:  Scoliosis (and Kyphoscoliosis), Idiopathic; Spinal stenosis, Lumbar region; Radiculopathy, Lumbar region; Low back pain, L 1 - S 1 levels  POST-OPERATIVE DIAGNOSIS:  Scoliosis (and Kyphoscoliosis), Idiopathic; Spinal stenosis, Lumbar region; Radiculopathy, Lumbar region; Low back pain, L 1 - S 1 levels  PROCEDURE:  Procedure(s) with comments: Right Lumbar one-two, Lumbar two-three, Lumbar three-four, Lumbar four-five Anterior lateral lumbar interbody fusion (Right) - Right L1-2 L2-3 L3-4 L4-5 Anterior lateral lumbar interbody fusion Lumbar one-Sacral one Bilateral percutaneous pedicle screws (Right) - L1-S1 Bilateral percutaneous pedicle screws  PEEK interbody cages with allograft bone graft   SURGEON:  Surgeon(s) and Role:    * Erline Levine, MD - Primary    * Eustace Moore, MD - Assisting  PHYSICIAN ASSISTANT:   ASSISTANTS: P[place], RN   ANESTHESIA:   general  EBL:  Total I/O In: 2300 [I.V.:2300] Out: 1300 [Urine:1200; Blood:100]  BLOOD ADMINISTERED:none  DRAINS: none   LOCAL MEDICATIONS USED:  LIDOCAINE   SPECIMEN:  No Specimen  DISPOSITION OF SPECIMEN:  N/A  COUNTS:  YES  TOURNIQUET:  * No tourniquets in log *  DICTATION: DICTATION: Patient is a 73 year old with 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 It was elected to take her to surgery for anterolateral decompression and posterior pedicle screw fixation.  Procedure: Patient was brought to the operating room and placed in a left lateral decubitus position on the operative table and using orthogonally projected C-arm fluoroscopy the patient was placed so that the L 1-2, L2-3 L3-4 and L4-5 levels were visualized in AP and lateral plane. The patient was then taped into position. The table was flexed so as to expose the L4-5 level as the patient has a high iliac crest. Skin was marked along with a posterior  finger dissection incision. His flank was then prepped and draped in usual sterile fashion and incisions were made sequentially at L4-5 L3-4 and L2-3 levels. Posterior finger dissection was made to enter the retroperitoneal space and then subsequently the probe was inserted into the psoas muscle from the right side initially at the L4-5 level. After mapping the neural elements were able to dock the probe per the midpoint of this vertebral level and without indications electrically of too close proximity to the neural tissues. This required redirecting of the probe to avoid the lumbosacral plexus. Subsequently the self-retaining tractor was.after sequential dilators were utilized the shim was employed and the interspace was cleared of psoas muscle and then incised. A thorough discectomy was performed. Angled instruments were used to clear the interspace of disc material. After thorough discectomy was performed and this was performed using AP and lateral fluoroscopy a 8 lordotic by 50 x 22 mm implant was packed with BMP and Vitoss. This was tamped into position using the slides and its position was confirmed on AP and lateral fluoroscopy. Subsequently exposure was performed at the L3-4 level and similar dissection was performed with locking of the self-retaining retractor. At this level were able to place a 10 lordotic by 22 x 50 mm implant packed in a similar fashion. At the L2-3 level were able to place an 8 mm 10 degree by 50 x 22 mm implant packed in a similar fashion. At the L 12 level, an 8 x 18 x 45 10 degree implant was packed and placed in a similar fashion.  A small hole was identified in the pleura at  this upper level, which was repaired with a 4-0 Chromic suture. There was no air leak at this point. Hemostasis was assured the wounds were irrigated interrupted Vicryl sutures and wounds were dressed with Dermabond..  Patient was then turned into a prone position on the operating table on chest rolls and  using AP and lateral fluoroscopy throughout this portion of the procedure, pedicle screws were placed using Nuvasive Reline cannulated percutaneous screws. 2 screws were placed at L1 a(L, R)  (6.5 x 50 mm and 6.5 x 45) ) and 1 at L2 (6.5 x 45 left) and two at L 3 (6.5 x 50, 6.5 x 45)  and 2 at L4 (6.5 x 50) and L 5 (6.5 x 45 and 6.5 x 50) and 2 at S 1 (6.5 x 45). Bendini rod bending system was utilized (145 on the right and 140 on the left) and locked down on the screws. All connections were then torqued and the Towers were disassembled. The wounds were irrigated and then closed with 1, 2-0 and 3-0 Vicryl stitches. Sterile occlusive dressing was placed with Dermabond. The patient was then extubated in the operating room and taken to recovery in stable and satisfactory condition having tolerated her operation well. Counts were correct at the end of the case.  Preoperative measurements were: PT 34 degrees, PI 77 degrees, LL 52 degrees, postop 68 degrees, PI-LL 25 preop and 9 degrees postop), SVA 12.1 cm and Cobb angle preoperatively 22 degrees.   PLAN OF CARE: Admit to inpatient   PATIENT DISPOSITION:  PACU - hemodynamically stable.   Delay start of Pharmacological VTE agent (>24hrs) due to surgical blood loss or risk of bleeding: yes

## 2015-07-03 NOTE — Progress Notes (Signed)
Aaron Edelman NP, made aware of decreasing sats, pneumothorax ,lots of pain, orders for unit

## 2015-07-03 NOTE — Anesthesia Procedure Notes (Signed)
Procedure Name: Intubation Date/Time: 07/03/2015 7:42 AM Performed by: Susa Loffler Pre-anesthesia Checklist: Patient identified, Timeout performed, Emergency Drugs available, Suction available and Patient being monitored Patient Re-evaluated:Patient Re-evaluated prior to inductionOxygen Delivery Method: Circle system utilized Preoxygenation: Pre-oxygenation with 100% oxygen Intubation Type: IV induction Ventilation: Mask ventilation without difficulty Laryngoscope Size: Mac and 3 Grade View: Grade I Tube type: Oral Tube size: 7.5 mm Number of attempts: 1 Airway Equipment and Method: Stylet Placement Confirmation: ETT inserted through vocal cords under direct vision,  positive ETCO2 and breath sounds checked- equal and bilateral Secured at: 23 cm Tube secured with: Tape Dental Injury: Teeth and Oropharynx as per pre-operative assessment

## 2015-07-03 NOTE — Progress Notes (Addendum)
Awake, alert, MAEW.  Stable. CXR pending.

## 2015-07-03 NOTE — Progress Notes (Signed)
Utilization review completed.  L. J. Asuncion Shibata RN, BSN, CM 

## 2015-07-03 NOTE — Brief Op Note (Signed)
07/03/2015  2:20 PM  PATIENT:  [name]  74 y.o. female  PRE-OPERATIVE DIAGNOSIS:  Scoliosis (and Kyphoscoliosis), Idiopathic; Spinal stenosis, Lumbar region; Radiculopathy, Lumbar region; Low back pain, L 1 - S 1 levels  POST-OPERATIVE DIAGNOSIS:  Scoliosis (and Kyphoscoliosis), Idiopathic; Spinal stenosis, Lumbar region; Radiculopathy, Lumbar region; Low back pain, L 1 - S 1 levels  PROCEDURE:  Procedure(s) with comments: Right Lumbar one-two, Lumbar two-three, Lumbar three-four, Lumbar four-five Anterior lateral lumbar interbody fusion (Right) - Right L1-2 L2-3 L3-4 L4-5 Anterior lateral lumbar interbody fusion Lumbar one-Sacral one Bilateral percutaneous pedicle screws (Right) - L1-S1 Bilateral percutaneous pedicle screws  PEEK interbody cages with allograft bone graft   SURGEON:  Surgeon(s) and Role:    * Erline Levine, MD - Primary    * Eustace Moore, MD - Assisting  PHYSICIAN ASSISTANT:   ASSISTANTS: Poteat, RN   ANESTHESIA:   general  EBL:  Total I/O In: 2300 [I.V.:2300] Out: 1300 [Urine:1200; Blood:100]  BLOOD ADMINISTERED:none  DRAINS: none   LOCAL MEDICATIONS USED:  LIDOCAINE   SPECIMEN:  No Specimen  DISPOSITION OF SPECIMEN:  N/A  COUNTS:  YES  TOURNIQUET:  * No tourniquets in log *  DICTATION: DICTATION: Patient is a 74 year old with 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 It was elected to take her to surgery for anterolateral decompression and posterior pedicle screw fixation.  Procedure: Patient was brought to the operating room and placed in a left lateral decubitus position on the operative table and using orthogonally projected C-arm fluoroscopy the patient was placed so that the L 1-2, L2-3 L3-4 and L4-5 levels were visualized in AP and lateral plane. The patient was then taped into position. The table was flexed so as to expose the L4-5 level as the patient has a high iliac crest. Skin was marked along with a posterior  finger dissection incision. His flank was then prepped and draped in usual sterile fashion and incisions were made sequentially at L4-5 L3-4 and L2-3 levels. Posterior finger dissection was made to enter the retroperitoneal space and then subsequently the probe was inserted into the psoas muscle from the right side initially at the L4-5 level. After mapping the neural elements were able to dock the probe per the midpoint of this vertebral level and without indications electrically of too close proximity to the neural tissues. This required redirecting of the probe to avoid the lumbosacral plexus. Subsequently the self-retaining tractor was.after sequential dilators were utilized the shim was employed and the interspace was cleared of psoas muscle and then incised. A thorough discectomy was performed. Angled instruments were used to clear the interspace of disc material. After thorough discectomy was performed and this was performed using AP and lateral fluoroscopy a 8 lordotic by 50 x 22 mm implant was packed with BMP and Vitoss. This was tamped into position using the slides and its position was confirmed on AP and lateral fluoroscopy. Subsequently exposure was performed at the L3-4 level and similar dissection was performed with locking of the self-retaining retractor. At this level were able to place a 10 lordotic by 22 x 50 mm implant packed in a similar fashion. At the L2-3 level were able to place an 8 mm 10 degree by 50 x 22 mm implant packed in a similar fashion. At the L 12 level, an 8 x 18 x 45 10 degree implant was packed and placed in a similar fashion.  A small hole was identified in the pleura at  this upper level, which was repaired with a 4-0 Chromic suture. There was no air leak at this point. Hemostasis was assured the wounds were irrigated interrupted Vicryl sutures and wounds were dressed with Dermabond..  Patient was then turned into a prone position on the operating table on chest rolls and  using AP and lateral fluoroscopy throughout this portion of the procedure, pedicle screws were placed using Nuvasive Reline cannulated percutaneous screws. 2 screws were placed at L1 a(L, R)  (6.5 x 50 mm and 6.5 x 45) ) and 1 at L2 (6.5 x 45 left) and two at L 3 (6.5 x 50, 6.5 x 45)  and 2 at L4 (6.5 x 50) and L 5 (6.5 x 45 and 6.5 x 50) and 2 at S 1 (6.5 x 45). Bendini rod bending system was utilized (145 on the right and 140 on the left) and locked down on the screws. All connections were then torqued and the Towers were disassembled. The wounds were irrigated and then closed with 1, 2-0 and 3-0 Vicryl stitches. Sterile occlusive dressing was placed with Dermabond. The patient was then extubated in the operating room and taken to recovery in stable and satisfactory condition having tolerated her operation well. Counts were correct at the end of the case.  Preoperative measurements were: PT 34 degrees, PI 77 degrees, LL 52 degrees, postop 68 degrees, PI-LL 25 preop and 9 degrees postop), SVA 12.1 cm and Cobb angle preoperatively 22 degrees.   PLAN OF CARE: Admit to inpatient   PATIENT DISPOSITION:  PACU - hemodynamically stable.   Delay start of Pharmacological VTE agent (>24hrs) due to surgical blood loss or risk of bleeding: yes

## 2015-07-03 NOTE — H&P (Signed)
Patient ID:   [id number] Patient: [name]  Date of Birth: 09/12/1941 Visit Type: Office Visit   Date: 05/27/2015 12:45 PM Provider: Marchia Meiers. Vertell Limber MD   This 74 year old female presents for back pain.  History of Present Illness: 1.  back pain  The patient is continuing to complain of pain and says it is getting worse and that she is having more difficulty with walking and also increasing back pain.  I reviewed the patient's imaging studies with Dr. Randel Pigg and have subsequent weight presented them to the patient and her husband.  At this point I recommended that we pursue decompression and fusion using extreme lateral approach at the L1 to L2 3 L3 L4 and L4 L5 levels from a right-sided approach followed by percutaneous screw fixation L1 through S1 levels.  Based on my review of her imaging she has autofused L5 through S1 levels.  I do not believe that we will need to go up into her thoracic spine nor into her pelvis.  We then went over extensive patient teaching and brochures and models.  We answered both the patient and her husband's questions.  She will receive a TLSO brace prescription.  Surgery has been planned for 07/03/15.      Medical/Surgical/Interim History Reviewed, no change.  Last detailed document date:02/10/2015.   PAST MEDICAL HISTORY, SURGICAL HISTORY, FAMILY HISTORY, SOCIAL HISTORY AND REVIEW OF SYSTEMS I have reviewed the patient's past medical, surgical, family and social history as well as the comprehensive review of systems as included on the [facility] history form dated 02/10/2015, which I have signed.  Family History: Reviewed, no changes.  Last detailed document: 02/10/2015.   Social History: Tobacco use reviewed. Reviewed, no changes. Last detailed document date: 02/10/2015.      MEDICATIONS(added, continued or stopped this visit): Started Medication Directions Instruction Stopped   amlodipine 5 mg tablet take 1  tablet by oral route  every day     citalopram 10 mg tablet take 1 tablet by oral route  every day     EstroGel 1.25 gram/actuation (0.06%) transdermal gel pump apply 1 pump by topical route  every day to the arm (from wrist to shoulder)     irbesartan 150 mg tablet take 1 tablet by oral route  every day     magnesium Take as directed     meloxicam 15 mg tablet take 1 tablet by oral route  every day     montelukast 10 mg tablet take 1 tablet by oral route  every day in the evening     omeprazole 20 mg tablet,delayed release Take as directed     ProAir RespiClick 90 mcg/actuation breath activated inhale 2 puff by inhalation route  every 4 - 6 hours as needed     Spiriva Respimat 1.25 mcg/actuation solution for inhalation inhale 2 puff by inhalation route  every day     warfarin 5 mg tablet take 1 tablet by oral route  every day       ALLERGIES: Ingredient Reaction Medication Name Comment  STATINS-HMG-COA REDUCTASE INHIBITORS      Reviewed, no changes.    Vitals Date Temp F BP Pulse Ht In Wt Lb BMI BSA Pain Score  05/27/2015  94/53 88 63 141 24.98  5/10      IMPRESSION Severe symptomatic scoliosis and spondylolisthesis.  Patient wishes to proceed with surgery.  This is been scheduled for 07/03/15.  Assessment/Plan # Detail Type Description  1. 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       2. Assessment Spinal stenosis, lumbar region (M48.06).       3. Assessment Radiculopathy, lumbar region (M54.16).       4. Assessment Low back pain, unspecified back pain laterality, with sciatica presence unspecified (M54.5).         Pain Assessment/Treatment Pain Scale: 5/10. Method: Numeric Pain Intensity Scale. Location: back. Onset: 07/05/2014. Duration: varies. Quality: discomforting. Pain Assessment/Treatment follow-up plan of care: Patient is currently taking medications as prescribed..  Fall Risk Plan The patient has not fallen in the last  year.  XLIF L1-2, L2-3, L3 L4, L4 L5 levels with right-sided approach followed by percutaneous screw fixation L1 through S1 levels  Orders: Office Procedures/Services: Assessment Service Comments   Please mail Rx for TLSO to pt    Diagnostic Procedures: Assessment Procedure  M41.20 XLIF (right)- L1-L2 - L2-L3 - L3-L4 - L4-L5 - bilat percutaneous pedicle screws  - L1 - S1  M54.16 TLSO Brace             Provider:  Marchia Meiers. Vertell Limber MD  06/07/2015 04:05 PM Dictation edited by: Marchia Meiers. Vertell Limber    CC Providers: Suella Broad [address] [place] [address] [place] [employer], [place] [address]              Electronically signed by Marchia Meiers. Vertell Limber MD on 06/07/2015 04:06 PM  Patient ID:   [id number] Patient: [name]  Date of Birth: 01-14-1941 Visit Type: Office Visit   Date: 04/08/2015 11:00 AM Provider: Marchia Meiers. Vertell Limber MD   This 74 year old female presents for back pain.  History of Present Illness: 1.  back pain  Jacqulyn Cane visits for Dr. Donald Pore review at Dr. Ronnald Ramp' request.  I met with the patient for greater than 25 minutes to discuss her situation today I met with her and her husband and we reviewed her condition.  Patient describes gradually progressively disabling low back pain.  Since she is undergone multiple injections without relief and has undergone physical therapy and rehabilitation from November through March of this year without improvement.  She notes a history of TIA in the past 13 years ago for which she takes Coumadin but otherwise is not having significant medical problems.  The patient describes that she is essentially disabled and that she has pain across her thoracic spine and her low back with any activity and now she is essentially sedentary.  Any activities cause disabling back pain.  I reviewed the patient's imaging studies which include lumbar MRI and CT scan which show disc degeneration at L1-2, L2-3, L3 L4 and L4 L5 levels.  There is  also multilevel spondylolisthesis and severe degenerative changes throughout the lumbar spine.  I obtained AP and lateral complete spine radiographs which demonstrate that her thoracic curvature extends to the level of T10 and also that she has focal severe scoliosis up to L1.  I have not yet performed measurements regarding her curvature but think these will be important in terms of what kind of correction could be obtained and and to which levels surgical stabilization and corrective surgery would need to be performed.  I also spoke with the patient at length regarding the involved nature of this surgery and that if she is not severely disabled by her condition that I would recommend against surgery.  She feels that she has no choice other than living with it and not having any quality of life whatsoever.  I told  her that I would review her imaging and discuss treatment options with her and we will go from there.  So discuss her images with Dr. Randel Pigg.  On examination today the patient has full strength in both upper and lower extremities without apparent weakness she has symmetric reflexes she denies neck discomfort.      Medical/Surgical/Interim History Reviewed, no change.  Last detailed document date:02/10/2015.   PAST MEDICAL HISTORY, SURGICAL HISTORY, FAMILY HISTORY, SOCIAL HISTORY AND REVIEW OF SYSTEMS I have reviewed the patient's past medical, surgical, family and social history as well as the comprehensive review of systems as included on the [facility] history form dated 02/10/2015, which I have signed.  Family History: Reviewed, no changes.  Last detailed document: 02/10/2015.   Social History: Tobacco use reviewed. Reviewed, no changes. Last detailed document date: 02/10/2015.      MEDICATIONS(added, continued or stopped this visit): Started Medication Directions Instruction Stopped   amlodipine 5 mg tablet take 1 tablet by oral route  every day      citalopram 10 mg tablet take 1 tablet by oral route  every day     EstroGel 1.25 gram/actuation (0.06%) transdermal gel pump apply 1 pump by topical route  every day to the arm (from wrist to shoulder)     irbesartan 150 mg tablet take 1 tablet by oral route  every day     magnesium Take as directed     meloxicam 15 mg tablet take 1 tablet by oral route  every day     montelukast 10 mg tablet take 1 tablet by oral route  every day in the evening     omeprazole 20 mg tablet,delayed release Take as directed     ProAir RespiClick 90 mcg/actuation breath activated inhale 2 puff by inhalation route  every 4 - 6 hours as needed     Spiriva Respimat 1.25 mcg/actuation solution for inhalation inhale 2 puff by inhalation route  every day     warfarin 5 mg tablet take 1 tablet by oral route  every day       ALLERGIES: Ingredient Reaction Medication Name Comment  STATINS-HMG-COA REDUCTASE INHIBITORS      Reviewed, no changes.    Vitals Date Temp F BP Pulse Ht In Wt Lb BMI BSA Pain Score  04/08/2015  150/65 89 63 145 25.69  4/10      IMPRESSION The patient has advanced multilevel degenerative changes throughout her lumbar spine with thoracolumbar scoliosis.  Completed Orders (this encounter) Order Details Reason Side Interpretation Result Initial Treatment Date Region  Lumbar Spine- AP/Lat/Flex/Ex 1 of 2 4V L/S and 2V SCOLIOSIS     04/08/2015 All Levels to All Levels   Assessment/Plan # Detail Type Description   1. Assessment Low back pain, unspecified back pain laterality, with sciatica presence unspecified (M54.5).       2. Assessment Radiculopathy, lumbar region (M54.16).       3. 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       4. Assessment Spinal stenosis, lumbar region (M48.06).         Pain Assessment/Treatment Pain Scale: 4/10. Method: Numeric Pain Intensity Scale. Location: back. Onset: 07/05/2014. Duration: varies. Quality:  discomforting. Pain Assessment/Treatment follow-up plan of care: Patient is taking medications as prescribed..  Fall Risk Plan The patient has not fallen in the last year.  I have recommended review of her other images and consideration of surgical options and I will discuss these in more detail  with the patient.  Orders: Diagnostic Procedures: Assessment Procedure  M41.20 Scoliosis- AP/Lat  M48.06 Lumbar Spine- AP/Lat/Flex/Ex             Provider:  Marchia Meiers. Vertell Limber MD  04/08/2015 06:06 PM Dictation edited by: Marchia Meiers. Vertell Limber    CC Providers: Suella Broad [address] [place] [address] [place], [place] [address]              Electronically signed by Marchia Meiers. Vertell Limber MD on 04/08/2015 06:06 PM  Patient ID:   [id number] Patient: [name]  Date of Birth: [birth year] Visit Type: Office Visit   Date: 02/10/2015 08:30 AM Provider: Eustace Moore MD   This 74 year old female presents for back pain.  History of Present Illness: 1.  back pain  74 year old white female with a history of COPD and TIA on Coumadin who is seen in neurosurgical consultation at the kind request of Dr. Suella Broad with a long history of chronic severe low back pain.  She is worse with walking or housework and better with sitting or resting.  There is some occasional numbness in the quadriceps.  There is some left dorsiflexor pain at times.  But certainly back pain is her biggest complaint.  She saw Dr. Rolena Infante who recommended no surgery.  She has a history of scoliosis and stenosis.  She has tried physical therapy and aqua therapy which helped somewhat.  She has had one epidural steroid injection that did not help.  She also takes meloxicam.  No bowel or bladder changes.  Her pain is very debilitating and stops her from exercising or doing the things she enjoys.        PAST MEDICAL/SURGICAL HISTORY   (Detailed)  Disease/disorder Onset Date Management Date Comments  COPD      GERD       High cholesterol      Hypertension      Osteoarthritis         PAST MEDICAL HISTORY, SURGICAL HISTORY, FAMILY HISTORY, SOCIAL HISTORY AND REVIEW OF SYSTEMS I have reviewed the patient's past medical, surgical, family and social history as well as the comprehensive review of systems as included on the [facility] history form dated 02/10/2015, which I have signed.  Family History  (Detailed) Relationship Family Member Name Deceased Age at Death Condition Onset Age Cause of Death  Father    Cardiovascular disease  N  Mother    COPD  N  Mother    Cancer, unknown  N    SOCIAL HISTORY  (Detailed) Tobacco use reviewed. Preferred language is Unknown.   Smoking status: Never smoker.  SMOKING STATUS Use Status Type Smoking Status Usage Per Day Years Used Total Pack Years  no/never  Never smoker       HOME ENVIRONMENT/SAFETY  The Patient has fallen 1 times in the last year.  The fall(s) resulted in injury.  Details: bruised toe.       MEDICATIONS(added, continued or stopped this visit): Started Medication Directions Instruction Stopped   amlodipine 5 mg tablet take 1 tablet by oral route  every day     citalopram 10 mg tablet take 1 tablet by oral route  every day     EstroGel 1.25 gram/actuation (0.06%) transdermal gel pump apply 1 pump by topical route  every day to the arm (from wrist to shoulder)     irbesartan 150 mg tablet take 1 tablet by oral route  every day  magnesium Take as directed     meloxicam 15 mg tablet take 1 tablet by oral route  every day     montelukast 10 mg tablet take 1 tablet by oral route  every day in the evening     omeprazole 20 mg tablet,delayed release Take as directed     ProAir RespiClick 90 mcg/actuation breath activated inhale 2 puff by inhalation route  every 4 - 6 hours as needed     Spiriva Respimat 1.25 mcg/actuation solution for inhalation inhale 2 puff by inhalation route  every day     warfarin 5 mg  tablet take 1 tablet by oral route  every day       ALLERGIES: Ingredient Reaction Medication Name Comment  STATINS-HMG-COA REDUCTASE INHIBITORS        REVIEW OF SYSTEMS System Neg/Pos Details  Constitutional Negative Chills, fatigue, fever, malaise, night sweats, weight gain and weight loss.  ENMT Positive Wears glasses.  ENMT Negative Ear drainage, hearing loss, nasal drainage, otalgia, sinus pressure and sore throat.  Eyes Negative Eye discharge, eye pain and vision changes.  Respiratory Negative Chronic cough, cough, dyspnea, known TB exposure and wheezing.  Cardio Negative Chest pain, claudication, edema and irregular heartbeat/palpitations.  GI Negative Abdominal pain, blood in stool, change in stool pattern, constipation, decreased appetite, diarrhea, heartburn, nausea and vomiting.  GU Negative Dysuria, hematuria, hot flashes, irregular menses, polyuria, urinary frequency, urinary incontinence and urinary retention.  Endocrine Negative Cold intolerance, heat intolerance, polydipsia and polyphagia.  Neuro Negative Dizziness, extremity weakness, gait disturbance, headache, memory impairment, numbness in extremity, seizures and tremors.  Psych Positive Depression.  Psych Negative Anxiety and insomnia.  Integumentary Negative Brittle hair, brittle nails, change in shape/size of mole(s), hair loss, hirsutism, hives, pruritus, rash and skin lesion.  MS Positive Back pain, Neck pain, Leg pain.  MS Negative Joint pain, joint swelling and muscle weakness.  Hema/Lymph Negative Easy bleeding, easy bruising and lymphadenopathy.  Allergic/Immuno Negative Contact allergy, environmental allergies, food allergies and seasonal allergies.  Reproductive Negative Breast discharge, breast lump(s), dysmenorrhea, dyspareunia, history of abnormal PAP smear and vaginal discharge.     Vitals Date Temp F BP Pulse Ht In Wt Lb BMI BSA Pain Score  02/10/2015  123/81 76 63 150 26.57  0/10     PHYSICAL  EXAM General Level of Distress: no acute distress Overall Appearance: Normal    Cardiovascular Cardiac: regular rate and rhythm  Respiratory Lungs: non-labored  Neurological Orientation: normal Recent and Remote Memory: normal Attention Span and Concentration:   normal Language: normal Fund of Knowledge: normal  Right Left Sensation: normal normal Upper Extremity Coordination: normal normal  Lower Extremity Coordination: normal normal  Musculoskeletal Gait and Station: antalgic with a hip shift laterally to the right  Right Left Upper Extremity Muscle Strength: normal normal Lower Extremity Muscle Strength: normal normal Upper Extremity Muscle Tone:  normal normal Lower Extremity Muscle Tone: normal normal  Motor Strength Upper and lower extremity motor strength was tested in the clinically pertinent muscles.     Deep Tendon Reflexes  Right Left Biceps: normal normal Triceps: normal normal Brachiloradialis: normal normal Patellar: normal normal Achilles: normal normal  Cranial Nerves II. Optic Nerve/Visual Fields: normal III. Oculomotor: normal IV. Trochlear: normal V. Trigeminal: normal VI. Abducens: normal VII. Facial: normal VIII. Acoustic/Vestibular: normal IX. Glossopharyngeal: normal X. Vagus: normal XI. Spinal Accessory: normal XII. Hypoglossal: normal  Motor and other Tests Lhermittes: negative Pronator drift: absent     Right Left Hoffman's: absent absent  SLR: negative negative Toe Walk: normal normal Heel Walk: normal normal     DIAGNOSTIC RESULTS MRI the lumbar spine shows multilevel spondylosis and degenerative disc disease with some retrolisthesis of L1-L2 and L2 on L3 with moderate stenosis at L2-3 and more advanced stenosis at L3 L4.  I think there is a grade 1 spondylolisthesis at L3 L4.  There is disc space collapse at all levels.  There is significant facet arthropathy at L2 through L3 4.  There is obvious scoliosis which  appears to be degenerative in nature.  We did AP and lateral lumbar spine x-rays with flexion and extension views in the office today for the indication of evaluating her scoliosis.  This shows disc space collapse on the right at L1 to L2 3 and L3 L4 causing curvature of the spine, lateral listhesis of L2 on L3 and spondylolisthesis at L3 L4    IMPRESSION Significant degenerative scoliosis with varying degrees of stenosis and lateral listhesis and spondylolisthesis causing severe back pain with occasional radicular symptoms.  Failure to respond to medical management and physical therapy and epidural steroid injections.  I'm not sure there is much I can do to help her short of a fairly extensive lumbar fusion surgery.  The smaller surgery would be something like an L2 -4 fusion, but I'm not sure what affect that would have on her overall sagittal and coronal balance and potential for development of adjacent level disease.  Completed Orders (this encounter) Order Details Reason Side Interpretation Result Initial Treatment Date Region  Dietary management education, guidance, and counseling Encouraged to eat a well balanced diet and follow up with primary care physician.        Lumbar Spine- AP/Lat/Flex/Ex      02/10/2015    Assessment/Plan # Detail Type Description   1. 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       2. Assessment Spinal stenosis of lumbar region (M48.06).       3. Assessment Body mass index (BMI) 26.0-26.9, adult (T59.74).   Plan Orders Today's instructions / counseling include(s) Dietary management education, guidance, and counseling.         Pain Assessment/Treatment Pain Scale: 0/10. Method: Numeric Pain Intensity Scale. Location: back. Onset: 07/05/2014. Duration: varies. Quality: discomforting.  Fall Risk Plan The Patient has fallen 1 times in the last year. The fall(s) resulted in injury. Details: bruised toe. Falls risk follow-up plan of  care: Assisted devices: Advised to use safety measures when available..  CT scan of lumbar spine for possible surgical planning  I would like to see the results of her latest bone density tests.  Follow up after CT scan  Orders: Diagnostic Procedures: Assessment Procedure  M41.20 CT L-Spine W/o Contrast  M41.20 Return to Clinic after study is performed  M48.06 Lumbar Spine- AP/Lat/Flex/Ex  M48.06 Lumbar Spine- AP/Lat/Flex/Ex  Instruction(s)/Education: Assessment Instruction  [id number] Dietary management education, guidance, and counseling             Provider:  Eustace Moore MD  02/10/2015 08:49 AM Dictation edited by: Eustace Moore    CC Providers: Suella Broad [address], [place] [address] [place], [place] [address]              Electronically signed by Eustace Moore MD on 02/10/2015 08:49 AM

## 2015-07-03 NOTE — Transfer of Care (Signed)
Immediate Anesthesia Transfer of Care Note  Patient: [name]  Procedure(s) Performed: Procedure(s) with comments: Right Lumbar one-two, Lumbar two-three, Lumbar three-four, Lumbar four-five Anterior lateral lumbar interbody fusion (Right) - Right L1-2 L2-3 L3-4 L4-5 Anterior lateral lumbar interbody fusion Lumbar one-Sacral one Bilateral percutaneous pedicle screws (Right) - L1-S1 Bilateral percutaneous pedicle screws  Patient Location: PACU  Anesthesia Type:General  Level of Consciousness: awake, alert  and oriented  Airway & Oxygen Therapy: Patient Spontanous Breathing and Patient connected to nasal cannula oxygen  Post-op Assessment: Report given to RN and Post -op Vital signs reviewed and stable  Post vital signs: Reviewed and stable  Last Vitals:  Filed Vitals:   07/03/15 1430  BP:   Pulse: 52  Temp:   Resp: 16    Complications: No apparent anesthesia complications

## 2015-07-03 NOTE — Anesthesia Preprocedure Evaluation (Signed)
Anesthesia Evaluation  Patient identified by MRN, date of birth, ID band Patient awake    Reviewed: Allergy & Precautions, NPO status   Airway Mallampati: II   Neck ROM: Full    Dental  (+) Edentulous Upper   Pulmonary shortness of breath, COPDformer smoker,  breath sounds clear to auscultation        Cardiovascular hypertension, + dysrhythmias Rhythm:Regular Rate:Normal     Neuro/Psych TIACVA    GI/Hepatic GERD-  ,  Endo/Other    Renal/GU Renal disease     Musculoskeletal  (+) Arthritis -,   Abdominal   Peds  Hematology   Anesthesia Other Findings   Reproductive/Obstetrics                             Anesthesia Physical Anesthesia Plan  ASA: IV  Anesthesia Plan: General   Post-op Pain Management:    Induction: Intravenous  Airway Management Planned: Oral ETT  Additional Equipment: Arterial line  Intra-op Plan:   Post-operative Plan: Possible Post-op intubation/ventilation  Informed Consent: I have reviewed the patients History and Physical, chart, labs and discussed the procedure including the risks, benefits and alternatives for the proposed anesthesia with the patient or authorized representative who has indicated his/her understanding and acceptance.   Dental advisory given  Plan Discussed with: CRNA and Surgeon  Anesthesia Plan Comments:         Anesthesia Quick Evaluation

## 2015-07-03 NOTE — Anesthesia Postprocedure Evaluation (Signed)
  Anesthesia Post-op Note  Patient: [name]  Procedure(s) Performed: Procedure(s) with comments: Right Lumbar one-two, Lumbar two-three, Lumbar three-four, Lumbar four-five Anterior lateral lumbar interbody fusion (Right) - Right L1-2 L2-3 L3-4 L4-5 Anterior lateral lumbar interbody fusion Lumbar one-Sacral one Bilateral percutaneous pedicle screws (Right) - L1-S1 Bilateral percutaneous pedicle screws  Patient Location: PACU  Anesthesia Type:General  Level of Consciousness: awake, alert  and sedated  Airway and Oxygen Therapy: Patient Spontanous Breathing  Post-op Pain: mild  Post-op Assessment: Post-op Vital signs reviewed, Patient's Cardiovascular Status Stable, Respiratory Function Stable and Pain level controlled              Post-op Vital Signs: stable  Last Vitals:  Filed Vitals:   07/03/15 1620  BP:   Pulse: 87  Temp: 36.3 C  Resp: 15    Complications: No apparent anesthesia complications

## 2015-07-03 NOTE — Interval H&P Note (Signed)
History and Physical Interval Note:  07/03/2015 7:16 AM  [name]  has presented today for surgery, with the diagnosis of Scoliosis (and Kyphoscoliosis), Idiopathic; Spinal stenosis, Lumbar region; Radiculopathy, Lumbar region; Low back pain, Unspecified  The various methods of treatment have been discussed with the patient and family. After consideration of risks, benefits and other options for treatment, the patient has consented to  Procedure(s) with comments: Right L1-2 L2-3 L3-4 L4-5 Anterior lateral lumbar interbody fusion (Right) - Right L1-2 L2-3 L3-4 L4-5 Anterior lateral lumbar interbody fusion L1-S1 Bilateral percutaneous pedicle screws (Right) - L1-S1 Bilateral percutaneous pedicle screws as a surgical intervention .  The patient's history has been reviewed, patient examined, no change in status, stable for surgery.  I have reviewed the patient's chart and labs.  Questions were answered to the patient's satisfaction.     [name] D

## 2015-07-04 ENCOUNTER — Inpatient Hospital Stay (HOSPITAL_COMMUNITY): Payer: Medicare Other

## 2015-07-04 LAB — GLUCOSE, CAPILLARY: Glucose-Capillary: 151 mg/dL — ABNORMAL HIGH (ref 65–99)

## 2015-07-04 MED ORDER — HYDROMORPHONE HCL 2 MG PO TABS
2.0000 mg | ORAL_TABLET | ORAL | Status: DC | PRN
Start: 2015-07-04 — End: 2015-07-10
  Administered 2015-07-04: 2 mg via ORAL
  Administered 2015-07-04: 4 mg via ORAL
  Administered 2015-07-04 (×2): 2 mg via ORAL
  Administered 2015-07-04 – 2015-07-06 (×11): 4 mg via ORAL
  Administered 2015-07-08 – 2015-07-10 (×3): 2 mg via ORAL
  Filled 2015-07-04 (×5): qty 2
  Filled 2015-07-04: qty 1
  Filled 2015-07-04: qty 2
  Filled 2015-07-04 (×4): qty 1
  Filled 2015-07-04: qty 2
  Filled 2015-07-04 (×2): qty 1
  Filled 2015-07-04 (×4): qty 2
  Filled 2015-07-04: qty 1

## 2015-07-04 NOTE — Evaluation (Signed)
Physical Therapy Evaluation Patient Details Name: [name] MRN: [id number] DOB: [birth year] Today's Date: 07/04/2015   History of Present Illness  Right Lumbar 1-5 Anterior lateral lumbar interbody fusion. Lumbar one-Sacral one Bilateral percutaneous pedicle screws  PMHx- CVAs, depression, anxiety  Clinical Impression  Patient is s/p above surgery resulting in the deficits listed below (see PT Problem List). On eval limited by decreasing BP (RN reports multiple pain medicines given to get pain under control). Patient will benefit from skilled PT to increase their independence and safety with mobility (while adhering to their precautions) to allow discharge to the venue listed below.     Follow Up Recommendations Home health PT;Supervision/Assistance - 24 hour (if slow progress, may need CIR)    Equipment Recommendations       Recommendations for Other Services       Precautions / Restrictions Precautions Precautions: Back;Fall Precaution Booklet Issued: No Required Braces or Orthoses: Spinal Brace Spinal Brace: Thoracolumbosacral orthotic;Applied in supine position Restrictions Weight Bearing Restrictions: No      Mobility  Bed Mobility Overal bed mobility: Needs Assistance Bed Mobility: Rolling;Sidelying to Sit;Sit to Sidelying Rolling: Min assist Sidelying to sit: Min assist;+2 for safety/equipment     Sit to sidelying: Min assist;+2 for safety/equipment General bed mobility comments: vc for technique to maintain precautions; +rails; return to bed due to low BP  Transfers Overall transfer level: Needs assistance Equipment used: Rolling walker (2 wheeled);None Transfers: Sit to/from [employer] to Stand: Min assist;+2 safety/equipment Stand pivot transfers: Min assist;+2 physical assistance;+2 safety/equipment       General transfer comment: vc for safe use of DME; steady assist; +2 for stand-pivot to bed due to low  BP  Ambulation/Gait Ambulation/Gait assistance: Min assist;+2 safety/equipment Ambulation Distance (Feet): 7 Feet Assistive device: Rolling walker (2 wheeled) Gait Pattern/deviations: Step-to pattern;Decreased stride length;Shuffle   Gait velocity interpretation: Below normal speed for age/gender General Gait Details: drowsy due to meds; short shuffling steps; max cues for use of RW and steeering  Stairs            Wheelchair Mobility    Modified Rankin (Stroke Patients Only)       Balance Overall balance assessment: Needs assistance Sitting-balance support: Single extremity supported;Feet supported Sitting balance-[name]: Poor Sitting balance - Comments: lethargic; pain meds   Standing balance support: Bilateral upper extremity supported Standing balance-[name]: Poor                               Pertinent Vitals/Pain See vitals flow sheet.   Pain Assessment: 0-10 Pain Score: 5  (end of session 9/10) Pain Location: back Pain Descriptors / Indicators: Aching;Operative site guarding Pain Intervention(s): Limited activity within patient's tolerance;Monitored during session;Premedicated before session;Repositioned    Home Living Family/patient expects to be discharged to:: Private residence Living Arrangements: Spouse/significant other Available Help at Discharge: Family;Available 24 hours/day Type of Home: House Home Access: Stairs to enter Entrance Stairs-Rails: Right (wall on left) Entrance Stairs-Number of Steps: 4 Home Layout: One level Home Equipment: Walker - 2 wheels;Shower seat - built in      Prior Function Level of Independence: Independent         Comments: limited ambulation due to pain     Hand Dominance   Dominant Hand: Right    Extremity/Trunk Assessment   Upper Extremity Assessment: Overall WFL for tasks assessed  Lower Extremity Assessment: Overall WFL for tasks assessed      Cervical /  Trunk Assessment: Other exceptions  Communication   Communication: No difficulties  Cognition Arousal/Alertness: Lethargic Behavior During Therapy: WFL for tasks assessed/performed Overall Cognitive Status: Within Functional Limits for tasks assessed                      General Comments General comments (skin integrity, edema, etc.): Sat up in chair x 8 minutes with BP continuing to drop (despite PAS hose reapplied)    Exercises        Assessment/Plan    PT Assessment Patient needs continued PT services  PT Diagnosis Difficulty walking;Acute pain   PT Problem List Decreased activity tolerance;Decreased balance;Decreased mobility;Decreased knowledge of use of DME;Decreased knowledge of precautions;Cardiopulmonary status limiting activity;Pain  PT Treatment Interventions DME instruction;Gait training;Stair training;Functional mobility training;Therapeutic activities;Patient/family education   PT Goals (Current goals can be found in the Care Plan section) Acute Rehab PT Goals Patient Stated Goal: decr pain PT Goal Formulation: With patient Time For Goal Achievement: 07/11/15 Potential to Achieve Goals: Good    Frequency Min 5X/week   Barriers to discharge        Co-evaluation PT/OT/SLP Co-Evaluation/Treatment: Yes Reason for Co-Treatment: Complexity of the patient's impairments (multi-system involvement);For patient/therapist safety PT goals addressed during session: Mobility/safety with mobility;Proper use of DME OT goals addressed during session: ADL's and self-care;Strengthening/ROM       End of Session Equipment Utilized During Treatment: Gait belt;Oxygen;Back brace Activity Tolerance: Treatment limited secondary to medical complications (Comment) (decr BP) Patient left: in bed;with call bell/phone within reach;with bed alarm set Nurse Communication: Mobility status (don brace supine; drop in BP)         Time: 3474-2595 PT Time Calculation (min) (ACUTE  ONLY): 35 min   Charges:   PT Evaluation $Initial PT Evaluation Tier I: 1 Procedure     PT G Codes:        Loda Bialas 08-01-15, 8:54 AM Pager [phone]

## 2015-07-04 NOTE — Evaluation (Signed)
Occupational Therapy Evaluation Patient Details Name: [name] MRN: [id number] DOB: 1941-10-11 Today's Date: 07/04/2015    History of Present Illness Right Lumbar 1-5 Anterior lateral lumbar interbody fusion. Lumbar one-Sacral one Bilateral percutaneous pedicle screws  PMHx- CVAs, depression, anxiety   Clinical Impression   This 74 yo female admitted and underwent above presents to acute OT with decreased mobility, increased pain, decreased O2 sats and BP with movement all affecting her PLOF of Independent to Mod I with BADLs. She will benefit from acute OT with follow up [place] (unless progress is slow then may need CIR) to get back to PLOF.    Follow Up Recommendations  Home health OT (if progresses slowly may need CIR)    Equipment Recommendations  None recommended by OT       Precautions / Restrictions Precautions Precautions: Back;Fall Precaution Booklet Issued: No Required Braces or Orthoses: Spinal Brace Spinal Brace: Thoracolumbosacral orthotic;Applied in supine position Restrictions Weight Bearing Restrictions: No      Mobility Bed Mobility Overal bed mobility: Needs Assistance Bed Mobility: Rolling;Sidelying to Sit;Sit to Sidelying Rolling: Min assist Sidelying to sit: Min assist;+2 for safety/equipment     Sit to sidelying: Min assist;+2 for safety/equipment General bed mobility comments: vc for technique to maintain precautions; +rails; return to bed due to low BP  Transfers Overall transfer level: Needs assistance Equipment used: Rolling walker (2 wheeled);None Transfers: Sit to/from [employer] to Stand: Min assist;+2 safety/equipment Stand pivot transfers: Min assist;+2 physical assistance;+2 safety/equipment       General transfer comment: vc for safe use of DME; steady assist; +2 for stand-pivot to bed due to low BP    Balance Overall balance assessment: Needs assistance Sitting-balance support: Single extremity  supported;Feet supported Sitting balance-[name]: Poor Sitting balance - Comments: lethargic; pain meds   Standing balance support: Bilateral upper extremity supported Standing balance-[name]: Poor                              ADL Overall ADL's : Needs assistance/impaired Eating/Feeding: Set up;Supervision/ safety;Sitting (due to lethargy)   Grooming: Minimal assistance;Sitting   Upper Body Bathing: Minimal assitance;Bed level   Lower Body Bathing: Total assistance (with min A sit<>stand)   Upper Body Dressing : Maximal assistance;Bed level   Lower Body Dressing: Total assistance (with min A sit<>stand)   Toilet Transfer: Minimal assistance;+2 for safety/equipment;Ambulation;RW   Toileting- Clothing Manipulation and Hygiene: Total assistance (with min A sit<>stand)               Vision Additional Comments: No change from baseline          Pertinent Vitals/Pain Pain Assessment: 0-10 Pain Score: 5  (end of session 9/10) Pain Location: back Pain Descriptors / Indicators: Aching;Operative site guarding Pain Intervention(s): Limited activity within patient's tolerance;Monitored during session;Premedicated before session;Repositioned     Hand Dominance Right   Extremity/Trunk Assessment Upper Extremity Assessment Upper Extremity Assessment: Overall WFL for tasks assessed   Lower Extremity Assessment Lower Extremity Assessment: Overall WFL for tasks assessed   Cervical / Trunk Assessment Cervical / Trunk Assessment: Other exceptions Cervical / Trunk Exceptions: incisional pain   Communication Communication Communication: No difficulties   Cognition Arousal/Alertness: Lethargic Behavior During Therapy: WFL for tasks assessed/performed Overall Cognitive Status: Within Functional Limits for tasks assessed  Home Living Family/patient expects to be discharged to:: Private residence Living  Arrangements: Spouse/significant other Available Help at Discharge: Family;Available 24 hours/day Type of Home: House Home Access: Stairs to enter [employer] of Steps: 4 Entrance Stairs-Rails: Right (wall on left) Home Layout: One level     Bathroom Shower/Tub: Walk-in shower;Curtain   Bathroom Toilet: Handicapped height     Home Equipment: Environmental consultant - 2 wheels;Shower seat - built in          Prior Functioning/Environment Level of Independence: Independent        Comments: limited ambulation due to pain    OT Diagnosis: Generalized weakness;Acute pain   OT Problem List: Decreased strength;Decreased range of motion;Decreased activity tolerance;Impaired balance (sitting and/or standing);Pain;Cardiopulmonary status limiting activity;Decreased knowledge of use of DME or AE   OT Treatment/Interventions: Self-care/ADL training;Patient/family education;Balance training;DME and/or AE instruction;Therapeutic activities    OT Goals(Current goals can be found in the care plan section) Acute Rehab OT Goals Patient Stated Goal: decr pain OT Goal Formulation: With patient Time For Goal Achievement: 07/11/15 Potential to Achieve Goals: Good  OT Frequency: Min 3X/week           Co-evaluation PT/OT/SLP Co-Evaluation/Treatment: Yes Reason for Co-Treatment: Complexity of the patient's impairments (multi-system involvement);For patient/therapist safety PT goals addressed during session: Mobility/safety with mobility;Proper use of DME OT goals addressed during session: ADL's and self-care;Strengthening/ROM      End of Session Equipment Utilized During Treatment: Gait belt;Rolling walker;Back brace;Oxygen (3 liters) Nurse Communication: Mobility status  Activity Tolerance: Patient limited by lethargy Patient left: in chair (then had to put back to bed due to BP dropping)   Time: 2334-3568 OT Time Calculation (min): 33 min Charges:  OT General Charges $OT Visit: 1  Procedure OT Evaluation $Initial OT Evaluation Tier I: 1 Procedure  Almon Register [phone] 07/04/2015, 10:02 AM

## 2015-07-05 NOTE — Progress Notes (Signed)
Patient still has considerable pain. She is having trouble getting out of bed. SHe moves her legs well. Very little appetite.

## 2015-07-05 NOTE — Progress Notes (Signed)
Physical Therapy Treatment Patient Details Name: [name] MRN: [id number] DOB: 05-16-1941 Today's Date: 07/05/2015    History of Present Illness Right Lumbar 1-5 Anterior lateral lumbar interbody fusion. Lumbar one-Sacral one Bilateral percutaneous pedicle screws  PMHx- CVAs, depression, anxiety    PT Comments    Patient making improvements with transfers.  Gait limited by pain and feeling "faint".     Follow Up Recommendations  Home health PT;Supervision/Assistance - 24 hour (May need to consider CIR if slow progress)     Equipment Recommendations  3in1 (PT)    Recommendations for Other Services       Precautions / Restrictions Precautions Precautions: Back;Fall Precaution Comments: Reviewed precautions with patient. Required Braces or Orthoses: Spinal Brace Spinal Brace: Thoracolumbosacral orthotic;Applied in supine position Restrictions Weight Bearing Restrictions: No    Mobility  Bed Mobility Overal bed mobility: Needs Assistance Bed Mobility: Rolling;Sidelying to Sit Rolling: Min assist Sidelying to sit: Mod assist       General bed mobility comments: Verbal cues for technique.  Total assist to don back brace in supine.  Mod assist to raise trunk to sitting position.  Once upright, patient able to maintain sitting balance.  Transfers Overall transfer level: Needs assistance Equipment used: Rolling walker (2 wheeled) Transfers: Sit to/from [employer] Sit to Stand: Min assist;+2 safety/equipment Stand pivot transfers: Min assist;+2 physical assistance;+2 safety/equipment       General transfer comment: Verbal cues for hand placement and technique.  Assist to steady during transfers.  Patient able to take several steps to pivot bed <> BSC.  Was able to void - RN notified.    Ambulation/Gait Ambulation/Gait assistance: Min assist;+2 safety/equipment Ambulation Distance (Feet): 8 Feet Assistive device: Rolling walker (2 wheeled) Gait  Pattern/deviations: Step-through pattern;Decreased step length - right;Decreased step length - left;Decreased stride length;Shuffle;Trunk flexed   Gait velocity interpretation: Below normal speed for age/gender General Gait Details: Verbal cues for safe use of RW.  Required assist to maneuver RW around foot of bed.  Patient taking short, shuffling steps.  Patient closes eye during gait - cues to open eyes and continue gait.  At 8', patient reports feeling faint.  Chair behind patient for safety.  Assist to control descent into chair.  BP was 105/51 and O2 sat at 92% on 3 liters O2.  RN notified.  Patient provided cool cloth and water.     Stairs            Wheelchair Mobility    Modified Rankin (Stroke Patients Only)       Balance           Standing balance support: Bilateral upper extremity supported Standing balance-[name]: Poor                      Cognition Arousal/Alertness: Lethargic Behavior During Therapy: WFL for tasks assessed/performed;Flat affect Overall Cognitive Status: Within Functional Limits for tasks assessed                      Exercises      General Comments        Pertinent Vitals/Pain Pain Assessment: 0-10 Pain Score: 7  Pain Location: Back Pain Descriptors / Indicators: Aching;Sore Pain Intervention(s): Limited activity within patient's tolerance;Monitored during session;Repositioned    Home Living                      Prior Function  PT Goals (current goals can now be found in the care plan section) Progress towards PT goals: Progressing toward goals    Frequency  Min 5X/week    PT Plan Current plan remains appropriate    Co-evaluation             End of Session Equipment Utilized During Treatment: Gait belt;Back brace;Oxygen Activity Tolerance: Patient limited by lethargy;No increased pain Patient left: in chair;with call bell/phone within reach;with family/visitor present      Time: 1216-1240 PT Time Calculation (min) (ACUTE ONLY): 24 min  Charges:  $Therapeutic Activity: 23-37 mins                    G Codes:      Despina Pole Jul 28, 2015, 2:35 PM Carita Pian. Sanjuana Kava, [place] Pager [phone]

## 2015-07-06 ENCOUNTER — Encounter (HOSPITAL_COMMUNITY): Payer: Self-pay | Admitting: Neurosurgery

## 2015-07-06 MED ORDER — ENOXAPARIN SODIUM 40 MG/0.4ML ~~LOC~~ SOLN
40.0000 mg | Freq: Every day | SUBCUTANEOUS | Status: DC
  Administered 2015-07-06 – 2015-07-09 (×4): 40 mg via SUBCUTANEOUS
  Filled 2015-07-06 (×4): qty 0.4

## 2015-07-06 NOTE — Progress Notes (Signed)
74 y.o. F who resides with spouse in private residence admitted for PLIF on 07/03/2015. Pain, Lethargy and poor progression have prompted PT to recommend HHPT/HHOT/3:1 and even CIR if pt does not begin to progress. Alerted CIR to screen for preliminary but pt does not meet any initial criteria. Will continue to follow for disposition and progression.

## 2015-07-06 NOTE — Progress Notes (Signed)
PT Cancellation Note  Patient Details Name: [name] MRN: [id number] DOB: Jun 08, 1941   Cancelled Treatment:    Reason Eval/Treat Not Completed: Patient not medically ready. Attempted to see patient. Pt very lethargic. RN reports BP to be 70/30. PT to return once BP improved as able.   Kingsley Callander 07/06/2015, 10:21 AM  Kittie Plater, PT, DPT Pager #: [phone] Office #: [phone]

## 2015-07-06 NOTE — Care Management Important Message (Signed)
Important Message  Patient Details  Name: [name] MRN: [id number] Date of Birth: Jan 18, 1941   Medicare Important Message Given:  Yes-second notification given    [name] 07/06/2015, 12:16 PM

## 2015-07-06 NOTE — Progress Notes (Addendum)
Pt has been lethargic today since 1000 Dilaudid administration, but able to appropriately follow all commands.  O2 sats fluctuating throughout the day, ranging from 89-100% on 3 L O2 via [place]. BP has been stable since bolus this am.  MD notified of the aforementioned information.  Verbal order to abstain from further use of Dilaudid. Will monitor.  Lovenox added q day.

## 2015-07-06 NOTE — Progress Notes (Signed)
Physical Therapy Treatment Patient Details Name: [name] MRN: [id number] DOB: 11/16/1941 Today's Date: 07/06/2015    History of Present Illness Right Lumbar 1-5 Anterior lateral lumbar interbody fusion. Lumbar one-Sacral one Bilateral percutaneous pedicle screws  PMHx- CVAs, depression, anxiety    PT Comments    Pt con't to have low BP with oOB mobility and lethargy from dilaudid. Pt did however demo increased ambulation tolerance this date to 67' with RW. If pt's cognition improves anticipate pt to be able to return home, however if pt con't to require min/modA for mobility and cont' with lethargy pt may need CIR to allow for maximal functional recovery for safe transition home with spouse.   Follow Up Recommendations  Home health PT;Supervision/Assistance - 24 hour     Equipment Recommendations  3in1 (PT)    Recommendations for Other Services       Precautions / Restrictions Precautions Precautions: Back;Fall Precaution Booklet Issued: No Precaution Comments: Reviewed precautions with patient. Required Braces or Orthoses: Spinal Brace Spinal Brace: Thoracolumbosacral orthotic;Applied in supine position Restrictions Weight Bearing Restrictions: No    Mobility  Bed Mobility Overal bed mobility: Needs Assistance Bed Mobility: Rolling;Sidelying to Sit;Sit to Sidelying Rolling: Min assist Sidelying to sit: Mod assist     Sit to sidelying: Mod assist General bed mobility comments: max v/c's for technique for transitioning sit to stand, increased time  Transfers Overall transfer level: Needs assistance Equipment used: Rolling walker (2 wheeled) Transfers: Sit to/from Stand Sit to Stand: Min assist         General transfer comment: v/c's for hand placement, increased time  Ambulation/Gait Ambulation/Gait assistance: +2 safety/equipment;Min assist Ambulation Distance (Feet): 20 Feet Assistive device: Rolling walker (2 wheeled) Gait Pattern/deviations: Step-to  pattern;Decreased stride length   Gait velocity interpretation: Below normal speed for age/gender General Gait Details: v/c's for safe walker management, increased time, 2 standign rest breaks, tolerance limited by request to urinate   Stairs            Wheelchair Mobility    Modified Rankin (Stroke Patients Only)       Balance Overall balance assessment: Needs assistance Sitting-balance support: Single extremity supported Sitting balance-[name]: Poor Sitting balance - Comments: lethargic; pain meds   Standing balance support: Bilateral upper extremity supported Standing balance-[name]: Poor                      Cognition Arousal/Alertness: Lethargic Behavior During Therapy: WFL for tasks assessed/performed Overall Cognitive Status: Within Functional Limits for tasks assessed                      Exercises      General Comments General comments (skin integrity, edema, etc.): pt with low BP, 105/50 upon sitting up and 95/65 upon returning from amb. pt assisted to [facility] and was able to stand and perform pericare with min A      Pertinent Vitals/Pain Pain Assessment: 0-10 Pain Score: 6  Pain Location: surgical site/back Pain Descriptors / Indicators: Aching Pain Intervention(s): Monitored during session    Home Living                      Prior Function            PT Goals (current goals can now be found in the care plan section) Acute Rehab PT Goals Patient Stated Goal: feel better Progress towards PT goals: Progressing toward goals    Frequency  Min 5X/week    PT Plan Current plan remains appropriate    Co-evaluation             End of Session Equipment Utilized During Treatment: Gait belt;Back brace;Oxygen Activity Tolerance: Patient limited by lethargy;No increased pain Patient left: in bed;with call bell/phone within reach;with bed alarm set;with family/visitor present     Time: 2694-8546 PT Time  Calculation (min) (ACUTE ONLY): 31 min  Charges:  $Gait Training: 8-22 mins $Therapeutic Activity: 8-22 mins                    G Codes:      Kingsley Callander 07/06/2015, 4:01 PM  Kittie Plater, PT, DPT Pager #: [phone] Office #: [phone]

## 2015-07-06 NOTE — Progress Notes (Signed)
Occupational Therapy Treatment Patient Details Name: [name] MRN: [id number] DOB: 04-Dec-1941 Today's Date: 07/06/2015    History of present illness Right Lumbar 1-5 Anterior lateral lumbar interbody fusion. Lumbar one-Sacral one Bilateral percutaneous pedicle screws  PMHx- CVAs, depression, anxiety   OT comments  Family education/demonstration on donning brace supine, bed mobility,safe transfers to/from b.room including clothing management and peri-care.  Husband very active and motivated to A pt., pain better managed today allowing pt. To tolerate session.  Follow Up Recommendations  Home health OT    Equipment Recommendations  None recommended by OT    Recommendations for Other Services      Precautions / Restrictions Precautions Precautions: Back;Fall Precaution Comments: Reviewed precautions with patient. Required Braces or Orthoses: Spinal Brace Spinal Brace: Thoracolumbosacral orthotic;Applied in supine position Restrictions Weight Bearing Restrictions: No       Mobility Bed Mobility Overal bed mobility: Needs Assistance Bed Mobility: Rolling;Sidelying to Sit Rolling: Min assist Sidelying to sit: Mod assist       General bed mobility comments: Verbal cues for technique.  Total assist to don back brace in supine.  Mod assist to raise trunk to sitting position.  Once upright, patient able to maintain sitting balance.  Transfers Overall transfer level: Needs assistance Equipment used: Rolling walker (2 wheeled) Transfers: Sit to/from [facility] Sit to Stand: Min assist Stand pivot transfers: Min assist       General transfer comment: Verbal cues for hand placement and technique.  Assist to steady during transfers.     Balance                                   ADL Overall ADL's : Needs assistance/impaired                         Toilet Transfer: Minimal assistance;Cueing for sequencing;Cueing for  safety;Ambulation;BSC;Comfort height toilet;RW Armed forces technical officer Details (indicate cue type and reason): 3n1 over commode Toileting- Clothing Manipulation and Hygiene: Minimal assistance;Sit to/from stand Toileting - Clothing Manipulation Details (indicate cue type and reason): able to pull adult briefs up/down, and perform front peri-care in standing.  husband present and very active in pts. care states "if you poop you know ill help you with that if you need it" also has sister that will be available to help at home     Functional mobility during ADLs: Minimal assistance General ADL Comments: pain still a factor but making gains.  min a with bed mobility, amb. husband very active and willing to assist pt. as needed, requried some cues to allow her to attempt components of ADLS as he was trying to do everything for her      Vision                     Perception     Praxis      Cognition   Behavior During Therapy: [facility] for tasks assessed/performed;Flat affect Overall Cognitive Status: Within Functional Limits for tasks assessed                       Extremity/Trunk Assessment               Exercises     Shoulder Instructions       General Comments      Pertinent Vitals/ Pain       Pain  Assessment: 0-10 Pain Score: 4  Pain Location: back Pain Descriptors / Indicators: Aching Pain Intervention(s): Monitored during session;Premedicated before session;Repositioned  Home Living                                          Prior Functioning/Environment              Frequency Min 3X/week     Progress Toward Goals  OT Goals(current goals can now be found in the care plan section)  Progress towards OT goals: Progressing toward goals     Plan Discharge plan remains appropriate    Co-evaluation                 End of Session Equipment Utilized During Treatment: Gait belt;Rolling walker   Activity Tolerance Patient  tolerated treatment well   Patient Left in chair;with call bell/phone within reach;with chair alarm set;with family/visitor present   Nurse Communication Other (comment) (pts. questions regarding if o2 is still needed, rn reports will attempt weaning today)        Time: 6861-6837 OT Time Calculation (min): 23 min  Charges: OT General Charges $OT Visit: 1 Procedure OT Treatments $Self Care/Home Management : 23-37 mins  Janice Coffin, COTA/L 07/06/2015, 9:49 AM

## 2015-07-06 NOTE — Progress Notes (Signed)
PHARMACIST - PHYSICIAN COMMUNICATION  CONCERNING:  Coumadin   RECOMMENDATION: On Coumadin prior to admission for TIA  Resume if and when appropriate?  Thank you. Anette Guarneri, PharmD

## 2015-07-06 NOTE — Progress Notes (Addendum)
   07/06/15 1010  Vitals  Temp 97.4 F (36.3 C)  Temp Source Oral  BP (!) 70/39 mmHg  BP Location Right Arm  BP Method Automatic  Patient Position (if appropriate) Sitting  Pulse Rate 95  Pulse Rate Source Dinamap  Oxygen Therapy  SpO2 91 %  O2 Device Nasal Cannula  O2 Flow Rate (L/min) 3 L/min  Pt sat in the chair post-OT session for appx 30 minutes, pt was lethargic, VS taken (see above).  BP taken manually, showing 94/50, and O2 sats back up to 94% on 3L.  Fluids reconnected at 75 cc/hr.  MD notified.  500 cc NS bolus given.  Will recheck BP.

## 2015-07-06 NOTE — Progress Notes (Signed)
Subjective: Patient reports still quite sore in back  Objective: Vital signs in last 24 hours: Temp:  [98.3 F (36.8 C)-98.9 F (37.2 C)] 98.4 F (36.9 C) (08/01 0525) Pulse Rate:  [84-93] 88 (08/01 0525) Resp:  [16-20] 17 (08/01 0525) BP: (119-129)/(54-58) 129/57 mmHg (08/01 0525) SpO2:  [93 %-97 %] 97 % (08/01 0525)  Intake/Output from previous day: 07/31 0701 - 08/01 0700 In: 3 [I.V.:3] Out: -  Intake/Output this shift:    Physical Exam: Strength full in legs, dressing CDI on back  Lab Results: No results for input(s): WBC, HGB, HCT, PLT in the last 72 hours. BMET No results for input(s): NA, K, CL, CO2, GLUCOSE, BUN, CREATININE, CALCIUM in the last 72 hours.  Studies/Results: Dg Chest Port 1 View  07/04/2015   CLINICAL DATA:  Respiratory distress.  EXAM: PORTABLE CHEST - 1 VIEW  COMPARISON:  07/03/2015 prior chest radiographs  FINDINGS: Cardiomegaly again noted.  Pulmonary vascular congestion is identified.  Left basilar atelectasis with possible small left pleural effusion again noted  There is no evidence of pneumothorax.  No other interval change identified.  IMPRESSION: Cardiomegaly with pulmonary vascular congestion, bibasilar atelectasis and possible small left pleural effusion again noted.  No evidence of pneumothorax.   Electronically Signed   By: Margarette Canada M.D.   On: 07/04/2015 08:15    Assessment/Plan: Improving.  Continue PT, pain mgt.    LOS: 3 days    Peggyann Shoals, MD 07/06/2015, 7:26 AM

## 2015-07-07 ENCOUNTER — Other Ambulatory Visit: Payer: Medicare Other

## 2015-07-07 NOTE — Progress Notes (Signed)
Occupational Therapy Treatment Patient Details Name: [name] MRN: [id number] DOB: 05-24-1941 Today's Date: 07/07/2015    History of present illness Right Lumbar 1-5 Anterior lateral lumbar interbody fusion. Lumbar one-Sacral one Bilateral percutaneous pedicle screws  PMHx- CVAs, depression, anxiety   OT comments  Pt progressing towards acute OT goals. Focus of session was walk-in shower transfer to 3n1, completed as detailed below. D/c plan remains appropriate. Updating equipment recommendations to 3n1 bedside comode. OT to continue to follow.  Follow Up Recommendations  Home health OT    Equipment Recommendations  3 in 1 bedside comode    Recommendations for Other Services      Precautions / Restrictions Precautions Precautions: Back;Fall Precaution Booklet Issued: No Precaution Comments: Reviewed precautions with patient. Pt able to state 3/3 BAT precautions. Required Braces or Orthoses: Spinal Brace Spinal Brace: Thoracolumbosacral orthotic;Applied in sitting position;Other (comment) (per MD ok to don/doff in sitting position) Restrictions Weight Bearing Restrictions: No       Mobility Bed Mobility Overal bed mobility: Needs Assistance Bed Mobility: Sit to Sidelying Rolling: Min assist Sidelying to sit: Mod assist     Sit to sidelying: Mod assist General bed mobility comments: Assist to advance BLE up onto bed. Cues for technique.  Transfers Overall transfer level: Needs assistance Equipment used: Rolling walker (2 wheeled) Transfers: Sit to/from Stand Sit to Stand: Min assist         General transfer comment: from recliner and 3n1. cues for technique with rw especially hand placement.    Balance Overall balance assessment: Needs assistance Sitting-balance support: Bilateral upper extremity supported;Feet supported Sitting balance-[name]: Poor     Standing balance support: Bilateral upper extremity supported;During functional activity Standing  balance-[name]: Poor Standing balance comment: rw for balance                   ADL Overall ADL's : Needs assistance/impaired                     Lower Body Dressing: Maximal assistance;Sit to/from stand Lower Body Dressing Details (indicate cue type and reason): min A sit<>stand + mod A to don/doff Toilet Transfer: Minimal assistance;Ambulation;BSC;RW Toilet Transfer Details (indicate cue type and reason): cues for technique with rw     Tub/ Shower Transfer: Minimal assistance;Walk-in shower;Ambulation;3 in 1;Rolling walker Tub/Shower Transfer Details (indicate cue type and reason): Cues for technique with rw. Pt with limited foot clearance this session. Simulated home setup in room. Edcuated pt and spouse on technique and need for assist with shower transfer.  Functional mobility during ADLs: Minimal assistance;Rolling walker General ADL Comments: Pt completed simulated walk-in shower transfer as detailed above. Pt then completed bed mobility as detailed below. Educated pt on new protocol for donning/doffing brace in sitting position per MD.       Vision                     Perception     Praxis      Cognition   Behavior During Therapy: WFL for tasks assessed/performed Overall Cognitive Status: Within Functional Limits for tasks assessed                       Extremity/Trunk Assessment               Exercises     Shoulder Instructions       General Comments      Pertinent Vitals/ Pain  Pain Assessment: 0-10 Pain Score: 4  Faces Pain Scale: Hurts whole lot Pain Location: surgical site/back Pain Descriptors / Indicators: Aching;Sore Pain Intervention(s): Limited activity within patient's tolerance;Monitored during session;Repositioned  Home Living                                          Prior Functioning/Environment              Frequency Min 3X/week     Progress Toward Goals  OT  Goals(current goals can now be found in the care plan section)  Progress towards OT goals: Progressing toward goals  Acute Rehab OT Goals Patient Stated Goal: Wants to go home OT Goal Formulation: With patient Time For Goal Achievement: 07/11/15 Potential to Achieve Goals: Good ADL Goals Pt Will Perform Grooming: with set-up;with supervision;standing Pt Will Perform Upper Body Bathing: with set-up;with supervision;sitting Pt Will Perform Lower Body Bathing: with min assist;with caregiver independent in assisting;with adaptive equipment;sit to/from stand Pt Will Perform Upper Body Dressing: with set-up;with supervision;sitting Pt Will Perform Lower Body Dressing: with min assist;with adaptive equipment;with caregiver independent in assisting;sit to/from stand Pt Will Transfer to Toilet: with supervision;ambulating;bedside commode Pt Will Perform Toileting - Clothing Manipulation and hygiene: with min assist;with caregiver independent in assisting;with adaptive equipment;sit to/from stand Pt Will Perform Tub/Shower Transfer: Shower transfer;with supervision;ambulating;rolling walker;shower seat Additional ADL Goal #1: Pt will be S for in and OOB for BADLs Additional ADL Goal #2: Pt will be able to state 3/3 back precautions - goal met  Plan Discharge plan remains appropriate    Co-evaluation                 End of Session Equipment Utilized During Treatment: Gait belt;Rolling walker;Back brace;Oxygen   Activity Tolerance Patient tolerated treatment well   Patient Left in bed;with call bell/phone within reach;with SCD's reapplied;with family/visitor present   Nurse Communication          Time: 3888-2800 OT Time Calculation (min): 24 min  Charges: OT General Charges $OT Visit: 1 Procedure OT Treatments $Self Care/Home Management : 23-37 mins  Hortencia Pilar 07/07/2015, 12:46 PM

## 2015-07-07 NOTE — Progress Notes (Signed)
Subjective: Patient reports "My back hurts, but my legs are ok"  Objective: Vital signs in last 24 hours: Temp:  [97.4 F (36.3 C)-99.9 F (37.7 C)] 98.9 F (37.2 C) (08/02 0556) Pulse Rate:  [80-97] 80 (08/02 0556) Resp:  [16-18] 16 (08/02 0556) BP: (70-123)/(37-59) 114/38 mmHg (08/02 0556) SpO2:  [91 %-97 %] 95 % (08/02 0814)  Intake/Output from previous day: 08/01 0701 - 08/02 0700 In: 80 [P.O.:80] Out: -  Intake/Output this shift:    Alert, coversant, without the lethargy of days past (off dilaudid). Discussed with pt and family member the goal to balance expected pain and alertness, and respiratory function to maximize safety and healing. Good strength BLE. Incisions without erythema, swelling, or drainage. Lumbar region bruising as expected. Dermabond intact. No BM yet.   Lab Results: No results for input(s): WBC, HGB, HCT, PLT in the last 72 hours. BMET No results for input(s): NA, K, CL, CO2, GLUCOSE, BUN, CREATININE, CALCIUM in the last 72 hours.  Studies/Results: No results found.  Assessment/Plan: Improving pain control, LOC, and sats.   LOS: 4 days  Per DrStern, ok to don brace sitting. Pt agrees to request suppository today when she feels strong enough to spend some time in the bathroom. Pressure dressings removed from lumbar incisions. May apply honeycomb dressings if available; otherwise , leave open to air (dermabond intact).  Continue to mobilize with PT/OT.   Verdis Prime 07/07/2015, 10:07 AM

## 2015-07-07 NOTE — Progress Notes (Signed)
Physical Therapy Treatment Patient Details Name: [name] MRN: [id number] DOB: 1941/06/15 Today's Date: 07/07/2015    History of Present Illness Right Lumbar 1-5 Anterior lateral lumbar interbody fusion. Lumbar one-Sacral one Bilateral percutaneous pedicle screws  PMHx- CVAs, depression, anxiety    PT Comments    Pt is progressing toward physical therapy goals. Pt demo increased ambulation tolerance today. Family members were present during therapy session. Reviewed back precautions and pt able to recall 2/3 by end of treatment session. Pt c/o of feeling hot during ambulation. Pt bp at start of therapy session in sitting was 111/61 then after ambulation bout it was 91/51. Reported to RN. Skilled PT is recommend to improve functional mobility needed for DC destination (HHPT).  Follow Up Recommendations  Home health PT;Supervision/Assistance - 24 hour     Equipment Recommendations  3in1 (PT)    Recommendations for Other Services       Precautions / Restrictions Precautions Precautions: Back;Fall Precaution Booklet Issued: No Precaution Comments: Reviewed precautions with patient. Pt able to state 3/3 BAT precautions. Required Braces or Orthoses: Spinal Brace Spinal Brace: Thoracolumbosacral orthotic;Applied in sitting position;Other (comment) (per MD ok to don/doff in sitting position) Restrictions Weight Bearing Restrictions: No    Mobility  Bed Mobility Overal bed mobility: Needs Assistance Bed Mobility: Sit to Sidelying Rolling: Min assist Sidelying to sit: Mod assist     Sit to sidelying: Mod assist General bed mobility comments: Assist to advance BLE up onto bed. Cues for technique.  Transfers Overall transfer level: Needs assistance Equipment used: Rolling walker (2 wheeled) Transfers: Sit to/from Stand Sit to Stand: Min assist         General transfer comment: from recliner and 3n1. cues for technique with rw especially hand  placement.  Ambulation/Gait Ambulation/Gait assistance: Min assist Ambulation Distance (Feet): 40 Feet Assistive device: Rolling walker (2 wheeled) Gait Pattern/deviations: Step-through pattern;Decreased stride length;Narrow base of support;Drifts right/left Gait velocity: slow Gait velocity interpretation: Below normal speed for age/gender General Gait Details: vc to stay close to RW, Min A for safety, pt c/o feeling hot when during ambulation bout    Stairs            Wheelchair Mobility    Modified Rankin (Stroke Patients Only)       Balance Overall balance assessment: Needs assistance Sitting-balance support: Bilateral upper extremity supported;Feet supported Sitting balance-[name]: Poor     Standing balance support: Bilateral upper extremity supported;During functional activity Standing balance-[name]: Poor Standing balance comment: rw for balance                    Cognition Arousal/Alertness: Awake/alert Behavior During Therapy: WFL for tasks assessed/performed Overall Cognitive Status: Within Functional Limits for tasks assessed                      Exercises      General Comments General comments (skin integrity, edema, etc.): At start of therapy in sitting pt bp was 11/61 and after ambulation bout bp dropped to 91/51. Pt with request to use bathroom. Pt supervision with hygiene and washing of hands without LOB. min A for transfer on/off commode.      Pertinent Vitals/Pain Pain Assessment: 0-10 Pain Score: 4  Faces Pain Scale: Hurts whole lot Pain Location: surgical site/back Pain Descriptors / Indicators: Aching;Sore Pain Intervention(s): Limited activity within patient's tolerance;Monitored during session;Repositioned    Home Living  Prior Function            PT Goals (current goals can now be found in the care plan section) Acute Rehab PT Goals Patient Stated Goal: Wants to go  home Progress towards PT goals: Progressing toward goals    Frequency  Min 5X/week    PT Plan Current plan remains appropriate    Co-evaluation             End of Session Equipment Utilized During Treatment: Gait belt;Back brace;Oxygen Activity Tolerance: Patient limited by fatigue Patient left: in chair;with call bell/phone within reach;with family/visitor present     Time: 6295-2841 PT Time Calculation (min) (ACUTE ONLY): 40 min  Charges:  $Gait Training: 8-22 mins $Therapeutic Activity: 23-37 mins                    G Codes:      [name] 16-Jul-2015, 1:29 PM  Lequita Halt (student physical therapy assistant) Acute Rehab [phone]

## 2015-07-08 LAB — URINALYSIS, ROUTINE W REFLEX MICROSCOPIC
Bilirubin Urine: NEGATIVE
Glucose, UA: NEGATIVE mg/dL
Hgb urine dipstick: NEGATIVE
Ketones, ur: NEGATIVE mg/dL
Leukocytes, UA: NEGATIVE
Nitrite: NEGATIVE
Protein, ur: NEGATIVE mg/dL
Specific Gravity, Urine: 1.01 (ref 1.005–1.030)
Urobilinogen, UA: 0.2 mg/dL (ref 0.0–1.0)
pH: 7 (ref 5.0–8.0)

## 2015-07-08 MED ORDER — KETOROLAC TROMETHAMINE 30 MG/ML IJ SOLN
30.0000 mg | Freq: Four times a day (QID) | INTRAMUSCULAR | Status: AC
  Administered 2015-07-08 – 2015-07-09 (×2): 30 mg via INTRAVENOUS
  Filled 2015-07-08 (×2): qty 1

## 2015-07-08 NOTE — Progress Notes (Signed)
Pt c/o of severe pain on the back, had 2 tab percocet earlier at 1800 and tab dilaudid 2mg  at 1950 but still moaning and groaning in pain, Dr Christella Noa (on call) paged and notified, ordered 2 doses of iv Toradol 30mg , same commenced at 2238, pt repositioned and reassured, will however continue to monitor. Obasogie-Asidi, Aariel Ems Efe

## 2015-07-08 NOTE — Progress Notes (Signed)
Occupational Therapy Treatment Patient Details Name: [name] MRN: [id number] DOB: 1941-09-18 Today's Date: 07/08/2015    History of present illness Right Lumbar 1-5 Anterior lateral lumbar interbody fusion. Lumbar one-Sacral one Bilateral percutaneous pedicle screws  PMHx- CVAs, depression, anxiety   OT comments  Pt progressing towards acute OT goals. Focus of session was practicing bed mobility and education on AE and compensatory strategies for LB ADLs and pericare as detailed below. D/c plan remains appropriate.   Follow Up Recommendations  Home health OT    Equipment Recommendations  3 in 1 bedside comode    Recommendations for Other Services      Precautions / Restrictions Precautions Precautions: Back;Fall Precaution Booklet Issued: No Precaution Comments: Reviewed precautions with patient. Pt able to state 3/3 BAT precautions. Required Braces or Orthoses: Spinal Brace Spinal Brace: Thoracolumbosacral orthotic;Applied in sitting position;Other (comment) (per MD) Restrictions Weight Bearing Restrictions: No       Mobility Bed Mobility Overal bed mobility: Needs Assistance Bed Mobility: Rolling;Sidelying to Sit Rolling: Min guard Sidelying to sit: Min assist       General bed mobility comments: verbal and tactile cues for technique to roll onto side before sitting up. Min A to powerup to EOB position  Transfers Overall transfer level: Needs assistance Equipment used: Rolling walker (2 wheeled) Transfers: Sit to/from Stand Sit to Stand: Min guard         General transfer comment: min guard for safety, cues for technique, extra time     Balance Overall balance assessment: Needs assistance Sitting-balance support: No upper extremity supported;Feet supported Sitting balance-[name]: Fair     Standing balance support: Bilateral upper extremity supported;During functional activity Standing balance-[name]: Poor Standing balance comment: rw for  balance                   ADL Overall ADL's : Needs assistance/impaired                                     Functional mobility during ADLs: Min guard;Rolling walker General ADL Comments: Pt practiced log rolling technique at min guard level, sidelying>EOB at min A level with cueing provided for tehcnique. PT educated on AE and compensatory strategies for LB ADLs and pericare. Pt ambulated to recliner and completed sit<>stand transfer all at min guard level and cues for techique.      Vision                     Perception     Praxis      Cognition   Behavior During Therapy: WFL for tasks assessed/performed Overall Cognitive Status: Within Functional Limits for tasks assessed (decreased recall of logrolling technique)                       Extremity/Trunk Assessment               Exercises     Shoulder Instructions       General Comments      Pertinent Vitals/ Pain       Pain Assessment: 0-10 Pain Score: 2  Pain Location: back Pain Descriptors / Indicators: Aching;Sore Pain Intervention(s): Limited activity within patient's tolerance;Monitored during session;Repositioned  Home Living  Prior Functioning/Environment              Frequency Min 3X/week     Progress Toward Goals  OT Goals(current goals can now be found in the care plan section)  Progress towards OT goals: Progressing toward goals  Acute Rehab OT Goals Patient Stated Goal: Wants to go home OT Goal Formulation: With patient Time For Goal Achievement: 07/11/15 Potential to Achieve Goals: Good ADL Goals Pt Will Perform Grooming: with set-up;with supervision;standing Pt Will Perform Upper Body Bathing: with set-up;with supervision;sitting Pt Will Perform Lower Body Bathing: with min assist;with caregiver independent in assisting;with adaptive equipment;sit to/from stand Pt Will Perform Upper  Body Dressing: with set-up;with supervision;sitting Pt Will Perform Lower Body Dressing: with min assist;with adaptive equipment;with caregiver independent in assisting;sit to/from stand Pt Will Transfer to Toilet: with supervision;ambulating;bedside commode Pt Will Perform Toileting - Clothing Manipulation and hygiene: with min assist;with caregiver independent in assisting;with adaptive equipment;sit to/from stand Pt Will Perform Tub/Shower Transfer: Shower transfer;with supervision;ambulating;rolling walker;shower seat Additional ADL Goal #1: Pt will be S for in and OOB for BADLs Additional ADL Goal #2: Pt will be able to state 3/3 back precautions - goal met Additional ADL Goal #3: Pt's husband will be independent with donning/doffing pt's brace  Plan Discharge plan remains appropriate    Co-evaluation                 End of Session Equipment Utilized During Treatment: Gait belt;Rolling walker;Back brace;Oxygen   Activity Tolerance Patient tolerated treatment well   Patient Left in chair;with call bell/phone within reach;with chair alarm set   Nurse Communication          Time: 6067-7034 OT Time Calculation (min): 17 min  Charges: OT General Charges $OT Visit: 1 Procedure OT Treatments $Self Care/Home Management : 8-22 mins  Hortencia Pilar 07/08/2015, 2:14 PM

## 2015-07-08 NOTE — Progress Notes (Signed)
Physical Therapy Treatment Patient Details Name: [name] MRN: [id number] DOB: February 04, 1941 Today's Date: 07/08/2015    History of Present Illness Right Lumbar 1-5 Anterior lateral lumbar interbody fusion. Lumbar one-Sacral one Bilateral percutaneous pedicle screws  PMHx- CVAs, depression, anxiety    PT Comments    Pt is progressing toward PT goals. Pt requires max encouragement to participate with therapy today. Pt has decreaed sitting tolerance, pt calling out to return to bed after 15 minutes. Pt demo increased ambulation tolerance today without an episode of dizziness. Pt still c/o of pain however pt states that during ambulation bout the pain is eased. Will follow acutely if still in hospital. Continue to recommend DC destination HHPT to increased functional mobility.  Follow Up Recommendations  Home health PT;Supervision/Assistance - 24 hour     Equipment Recommendations  3in1 (PT)    Recommendations for Other Services       Precautions / Restrictions Precautions Precautions: Back;Fall Precaution Booklet Issued: No Precaution Comments: Reviewed precautions with patient. Pt able to state 3/3 BAT precautions. Required Braces or Orthoses: Spinal Brace Spinal Brace: Thoracolumbosacral orthotic;Applied in sitting position (pt had brace on in sitting upon entering room) Restrictions Weight Bearing Restrictions: No    Mobility  Bed Mobility Overal bed mobility: Needs Assistance Bed Mobility: Sit to Sidelying Rolling: Min guard Sidelying to sit: Min assist     Sit to sidelying: Min assist General bed mobility comments: min A to help pt's LE on to bed  Transfers Overall transfer level: Needs assistance Equipment used: Rolling walker (2 wheeled) Transfers: Sit to/from Stand Sit to Stand: Min guard         General transfer comment: min guard A for safetey, vc for proper hand placement on RW  Ambulation/Gait Ambulation/Gait assistance: Min guard Ambulation  Distance (Feet): 100 Feet (x2) Assistive device: Rolling walker (2 wheeled) Gait Pattern/deviations: Decreased stride length;Trunk flexed;Narrow base of support;Antalgic;Step-through pattern Gait velocity: slow Gait velocity interpretation: Below normal speed for age/gender General Gait Details: vc for staying close to RW and proper hand placement on RW.    Stairs            Wheelchair Mobility    Modified Rankin (Stroke Patients Only)       Balance Overall balance assessment: Needs assistance Sitting-balance support: No upper extremity supported;Feet supported Sitting balance-[name]: Good     Standing balance support: Bilateral upper extremity supported;During functional activity Standing balance-[name]: Poor Standing balance comment: requires RW for standing balance,                     Cognition Arousal/Alertness: Awake/alert Behavior During Therapy: WFL for tasks assessed/performed Overall Cognitive Status: Within Functional Limits for tasks assessed                      Exercises      General Comments General comments (skin integrity, edema, etc.): c/o of being hot during therapy session however no episode of dizziness.      Pertinent Vitals/Pain Pain Assessment: Faces Pain Score: 2  Faces Pain Scale: Hurts even more Pain Location: posterior back Pain Descriptors / Indicators: Aching;Sore Pain Intervention(s): Monitored during session    Home Living                      Prior Function            PT Goals (current goals can now be found in the care plan section)  Acute Rehab PT Goals Patient Stated Goal: Wants to go home Progress towards PT goals: Progressing toward goals    Frequency  Min 5X/week    PT Plan Current plan remains appropriate    Co-evaluation             End of Session Equipment Utilized During Treatment: Gait belt;Back brace;Oxygen Activity Tolerance: Patient limited by  fatigue Patient left: in bed;with call bell/phone within reach;with bed alarm set     Time: 6948-5462 PT Time Calculation (min) (ACUTE ONLY): 16 min  Charges:  $Gait Training: 8-22 mins                    G Codes:      [name] 07-25-2015, 4:49 PM

## 2015-07-08 NOTE — Progress Notes (Signed)
Pain control this evening has been very difficult. Patient has a lot of pain in her back but also in her bladder when she has to urinate. She has urinary frequency. Continuing to monitor. Alithia Zavaleta, Rande Brunt, RN

## 2015-07-08 NOTE — Progress Notes (Signed)
Subjective: Patient reports discomfort in bladder and back.  Having hard time with brace.  Objective: Vital signs in last 24 hours: Temp:  [97.6 F (36.4 C)-99.1 F (37.3 C)] 98.5 F (36.9 C) (08/03 0531) Pulse Rate:  [58-90] 58 (08/03 0531) Resp:  [15-18] 18 (08/03 0531) BP: (101-124)/(38-78) 124/78 mmHg (08/03 0531) SpO2:  [93 %-96 %] 94 % (08/03 0816)  Intake/Output from previous day:   Intake/Output this shift:    Physical Exam: Full strength Bilateral lower extremities.  Dressings CDI.  Lab Results: No results for input(s): WBC, HGB, HCT, PLT in the last 72 hours. BMET No results for input(s): NA, K, CL, CO2, GLUCOSE, BUN, CREATININE, CALCIUM in the last 72 hours.  Studies/Results: No results found.  Assessment/Plan: Check PVR with [facility].  Send UA.  [employer] to refit brace and replace if necessary.  Continue to work on mobility and pain mgt.    LOS: 5 days    Peggyann Shoals, MD 07/08/2015, 8:27 AM

## 2015-07-08 NOTE — Progress Notes (Signed)
Orthopedic Tech Progress Note Patient Details:  [name] [birth year] [id number]  Patient ID: [name], female   DOB: 09-30-1941, 74 y.o.   MRN: [id number] Called in [employer] brace order; spoke with Ramonita Lab, [name] 07/08/2015, 9:50 AM

## 2015-07-09 ENCOUNTER — Ambulatory Visit: Payer: Medicare Other | Admitting: Pharmacist

## 2015-07-09 MED ORDER — WARFARIN SODIUM 7.5 MG PO TABS
7.5000 mg | ORAL_TABLET | Freq: Once | ORAL | Status: AC
  Administered 2015-07-09: 7.5 mg via ORAL
  Filled 2015-07-09: qty 1

## 2015-07-09 MED ORDER — WARFARIN - PHARMACIST DOSING INPATIENT
Freq: Every day | Status: DC
Start: 2015-07-09 — End: 2015-07-10

## 2015-07-09 NOTE — Progress Notes (Signed)
Occupational Therapy Treatment Patient Details Name: [name] MRN: [id number] DOB: 05-03-1941 Today's Date: 07/09/2015    History of present illness Right Lumbar 1-5 Anterior lateral lumbar interbody fusion. Lumbar one-Sacral one Bilateral percutaneous pedicle screws  PMHx- CVAs, depression, anxiety   OT comments  Pt limited by pain this session with c/o 7/10 pain in lower back and pt received medication prior to therapist entering the room. Pt agreeable to OT session to address bed mobility, back precaution, and use of AE for LB self care. Pt continues to benefit from OT intervention at this time. Pt returned to supine in bed with call bell and all needed items within reach. Visitor present in room.   Follow Up Recommendations  Home health OT    Equipment Recommendations  3 in 1 bedside comode    Recommendations for Other Services      Precautions / Restrictions Precautions Precautions: Back;Fall Precaution Booklet Issued: No Precaution Comments: Pt states 3/3 back precautions with 100% accuracy Spinal Brace: Thoracolumbosacral orthotic;Applied in sitting position Restrictions Weight Bearing Restrictions: No       Mobility Bed Mobility Overal bed mobility: Needs Assistance Bed Mobility: Sit to Sidelying Rolling: Min guard Sidelying to sit: Min assist     Sit to sidelying: Min assist General bed mobility comments: assistance to help pts LE onto bed this session.   Transfers                      Balance Overall balance assessment: Needs assistance Sitting-balance support: No upper extremity supported;Feet supported Sitting balance-[name]: Good Sitting balance - Comments: sitting on EOB while visitor assists pt to comb hair.                           ADL Overall ADL's : Needs assistance/impaired                     Lower Body Dressing: Minimal assistance;With adaptive equipment;Cueing for safety Lower Body Dressing Details  (indicate cue type and reason): cues for proper technique to don and doff B socks with use of long handled reacher and sock aide               General ADL Comments: Pt performed log roll technique for supine <>sit with min A to EOB with use of bed rail. Pt seated on EOB with OT providing assistance for pt to don TLSO in seated position. OT educating and demonstrating use of long handled reacher and sock aide in order to increase I in LB dressing tasks. Pt demonstrated with min verbal cues for proper technique.      Vision   Perception   Praxis    Cognition   Behavior During Therapy: WFL for tasks assessed/performed Overall Cognitive Status: Within Functional Limits for tasks assessed                       Extremity/Trunk Assessment               Exercises     Shoulder Instructions       General Comments      Pertinent Vitals/ Pain       Pain Assessment: 0-10 Pain Score: 7  Pain Location: back Pain Descriptors / Indicators: Aching;Sore Pain Intervention(s): Monitored during session;Repositioned     Prior Functioning/Environment              Frequency Min  3X/week     Progress Toward Goals  OT Goals(current goals can now be found in the care plan section)  Progress towards OT goals: Progressing toward goals     Plan Discharge plan remains appropriate    Co-evaluation          OT goals addressed during session: Proper use of Adaptive equipment and DME;ADL's and self-care      End of Session Equipment Utilized During Treatment: Back brace   Activity Tolerance Patient limited by pain   Patient Left in chair;with call bell/phone within reach;with chair alarm set;with family/visitor present   Nurse Communication          Time: 1310-1326 OT Time Calculation (min): 16 min  Charges: OT General Charges $OT Visit: 1 Procedure OT Treatments $Self Care/Home Management : 8-22 mins  Benay Pillow L 07/09/2015, 1:47 PM

## 2015-07-09 NOTE — Progress Notes (Signed)
ANTICOAGULATION CONSULT NOTE - Initial Consult  Pharmacy Consult for warfarin  Indication: hx TIA/CVA  Allergies  Allergen Reactions  . Clarithromycin     REACTION: possible rash but could have been legionarres dz with rash  . Lipitor [Atorvastatin]     MUSCLE SPASMS  . Simvastatin   . Crestor [Rosuvastatin Calcium]     fagitue and joint pain, "NO STATINS"    Patient Measurements: Height: 5\' 3"  (160 cm) Weight: 142 lb (64.411 kg) IBW/kg (Calculated) : 52.4  Vital Signs: Temp: 98.5 F (36.9 C) (08/04 0931) Temp Source: Oral (08/04 0931) BP: 104/48 mmHg (08/04 0931) Pulse Rate: 84 (08/04 0931)  Labs: No results for input(s): HGB, HCT, PLT, APTT, LABPROT, INR, HEPARINUNFRC, CREATININE, CKTOTAL, CKMB, TROPONINI in the last 72 hours.  Estimated Creatinine Clearance: 43.9 mL/min (by C-G formula based on Cr of 1).   Medical History: Past Medical History  Diagnosis Date  . SBE (subacute bacterial endocarditis) prophylaxis candidate   . COPD (chronic obstructive pulmonary disease)   . GERD (gastroesophageal reflux disease)   . Fibrocystic breast disease   . HLD (hyperlipidemia)   . Osteoarthritis   . Hemorrhoids   . History of cerebrovascular accident   . TIA (transient ischemic attack) last 2001    anticoagulation therapy on coumadin  . HTN (hypertension)   . Stroke     STROKES  . DUB (dysfunctional uterine bleeding)   . Atrophic vaginitis   . Menopausal symptoms   . DI (detrusor instability)   . Status post dilation of esophageal narrowing   . Depression 11/29/2013  . Nephrolithiasis     hx  . Pneumonia 2013  . Shingles since Nov 18, 2013    on right arm from shoulder to wrist  . Wears dentures     full upper, lower partial  . Wears glasses   . Dysrhythmia   . Shortness of breath dyspnea   . Anxiety   . History of kidney stones   . Basal cell carcinoma     Medications:  Prescriptions prior to admission  Medication Sig Dispense Refill Last Dose  .  albuterol (PROVENTIL HFA;VENTOLIN HFA) 108 (90 BASE) MCG/ACT inhaler Inhale 1-2 puffs into the lungs every 6 (six) hours as needed for wheezing or shortness of breath. 3 Inhaler 4 07/03/2015 at 0530  . amLODipine (NORVASC) 5 MG tablet Take 1 tablet (5 mg total) by mouth every morning. 90 tablet 3 07/03/2015 at 0500  . azelastine (ASTELIN) 137 MCG/SPRAY nasal spray Place 2 sprays into the nose daily. Use in each nostril as directed 90 mL 1 07/03/2015 at 0500  . budesonide-formoterol (SYMBICORT) 160-4.5 MCG/ACT inhaler Inhale 2 puffs into the lungs 2 (two) times daily. 3 Inhaler 4 07/03/2015 at 0500  . Calcium Carbonate-Vitamin D (CALCIUM + D PO) Take 1 tablet by mouth 2 (two) times daily.    Past Week at Unknown time  . citalopram (CELEXA) 10 MG tablet Take 1 tablet (10 mg total) by mouth every morning. 90 tablet 3 07/03/2015 at 0500  . enoxaparin (LOVENOX) 100 MG/ML injection Inject 1 mL (100 mg total) into the skin daily. (Patient taking differently: Inject 100 mg into the skin daily. Patient states she will start this medication on 06-28-15) 10 Syringe 0 07/02/2015 at Unknown time  . estradiol (CLIMARA - DOSED IN MG/24 HR) 0.0375 mg/24hr patch Place 1 patch (0.0375 mg total) onto the skin once a week. (Patient taking differently: Place 0.0375 mg onto the skin once a week. Put on  Saturdays) 12 patch 4 Past Month at Unknown time  . irbesartan (AVAPRO) 150 MG tablet Take 1 tablet (150 mg total) by mouth every morning. 90 tablet 3 07/02/2015 at Unknown time  . Magnesium 250 MG TABS Take 1 tablet by mouth daily as needed. Leg cramps   07/02/2015 at Unknown time  . Melatonin 1 MG TABS Take 3 mg by mouth at bedtime.    07/02/2015 at Unknown time  . meloxicam (MOBIC) 15 MG tablet Take 1 tablet (15 mg total) by mouth daily as needed for pain. 90 tablet 3 Past Week at Unknown time  . montelukast (SINGULAIR) 10 MG tablet Take 1 tablet (10 mg total) by mouth daily. 90 tablet 3 07/03/2015 at 0500  . Multiple Vitamin  (MULTIVITAMIN) tablet Take 1 tablet by mouth daily.     Past Week at Unknown time  . omeprazole (PRILOSEC) 20 MG capsule Take 1 capsule (20 mg total) by mouth 2 (two) times daily before a meal. 180 capsule 3 07/03/2015 at 0500  . Respiratory Therapy Supplies (FLUTTER) DEVI Use 2-3 times per day 1 each 0   . Tiotropium Bromide Monohydrate (SPIRIVA RESPIMAT) 2.5 MCG/ACT AERS Inhale 2 puffs into the lungs daily. 3 Inhaler 4 07/03/2015 at 0500  . Vitamin Mixture (VITAMIN E COMPLETE PO) Take 1 capsule by mouth daily. 400iu   Past Week at Unknown time  . warfarin (COUMADIN) 5 MG tablet Take as directed by anticoagulation clinic (Patient taking differently: Take 2.5-5 mg by mouth See admin instructions. Take as directed by anticoagulation clinic) 180 tablet 1 06-26-15  . lidocaine (XYLOCAINE JELLY) 2 % jelly Apply 1 application topically as needed. (Patient taking differently: Apply 1 application topically as needed. For shingles) 30 mL 1 More than a month at Unknown time    Assessment: 54 yof admitted for a lumbar fusion on chronic coumadin for hx TIA/CVA. INR was subtherapeutic on admission as patients coumadin had been held for surgery. No recent CBC. Coumadin to resume today. Also on prophylactic lovenox.   Goal of Therapy:  INR 2-3 Monitor platelets by anticoagulation protocol: Yes   Plan:  - Warfarin 7.5mg  PO x 1 tonight - Daily INR, CBC in AM - DC lovenox when INR is therapeutic  Camauri Craton, Rande Lawman 07/09/2015,11:36 AM

## 2015-07-09 NOTE — Care Management Important Message (Signed)
Important Message  Patient Details  Name: [name] MRN: [id number] Date of Birth: Dec 16, 1940   Medicare Important Message Given:  Yes-third notification given    [name] 07/09/2015, 2:22 PM

## 2015-07-09 NOTE — Progress Notes (Signed)
Thank you for consult on [name]. She is progressing well with therapy and home health PT/OT recommended post discharge. Will defer CIR consult for now.

## 2015-07-09 NOTE — Progress Notes (Signed)
Subjective: Patient reports "I'm better today than I was yesterday"  Objective: Vital signs in last 24 hours: Temp:  [97.8 F (36.6 C)-98.9 F (37.2 C)] 98.5 F (36.9 C) (08/04 0931) Pulse Rate:  [81-88] 84 (08/04 0931) Resp:  [17-19] 18 (08/04 0931) BP: (104-127)/(36-58) 104/48 mmHg (08/04 0931) SpO2:  [92 %-98 %] 96 % (08/04 0931)  Intake/Output from previous day: 08/03 0701 - 08/04 0700 In: 100 [P.O.:100] Out: 2925 [Urine:2925] Intake/Output this shift:    Alert, conversant, reports less lumbar pain today after a rough night.  Foley in place for retention. U/A was negative. Has walked with PT short distances. Incisions without erythema, swelling, or drainage. No BM yet.  Lab Results: No results for input(s): WBC, HGB, HCT, PLT in the last 72 hours. BMET No results for input(s): NA, K, CL, CO2, GLUCOSE, BUN, CREATININE, CALCIUM in the last 72 hours.  Studies/Results: No results found.  Assessment/Plan: Improving slowly   LOS: 6 days  Per DrStern, continue Foley today with plan to remove tomorrow; request CIR consult / SNF if CIR ineligible. Will work on bowels today and continue to mobilize with PT.  Ok to resume Coumadin per Pharmacy.    [name] 07/09/2015, 11:10 AM

## 2015-07-09 NOTE — Progress Notes (Signed)
This Probation officer called to the patient's room.  Upon assessment the patient's foley catheter was dislodged and lying on the bed.  Per patient request she would like to keep it out and try to urinate on her own.  Will continue to monitor. Cori Razor, RN

## 2015-07-09 NOTE — Progress Notes (Signed)
Physical Therapy Treatment Patient Details Name: [name] MRN: [id number] DOB: [birth year] Today's Date: 07/09/2015    History of Present Illness Right Lumbar 1-5 Anterior lateral lumbar interbody fusion. Lumbar one-Sacral one Bilateral percutaneous pedicle screws  PMHx- CVAs, depression, anxiety    PT Comments    Pt able to negotiate stairs using both railing at a close guard level of assistance. Ambulated min guard A relying on RW for balance, easily fatigued. Husband concerned if he can safely take care of pt at home. Therefore due to slow progression pt may benefit from ST SNF placement until she becomes more independent. We will continue to follow and progress patient as tolerated.  Follow Up Recommendations  SNF     Equipment Recommendations       Recommendations for Other Services       Precautions / Restrictions Precautions Precautions: Back;Fall Precaution Booklet Issued: No Precaution Comments: Pt states 3/3 back precautions with 100% accuracy Required Braces or Orthoses: Spinal Brace Spinal Brace: Thoracolumbosacral orthotic;Applied in sitting position Restrictions Weight Bearing Restrictions: No    Mobility  Bed Mobility Overal bed mobility: Needs Assistance Bed Mobility: Sit to Sidelying Rolling: Min guard Sidelying to sit: Min assist     Sit to sidelying: Min assist General bed mobility comments: assistance to help pts LE onto bed this session.   Transfers Overall transfer level: Needs assistance Equipment used: Rolling walker (2 wheeled) Transfers: Sit to/from Stand Sit to Stand: Min guard         General transfer comment: vc for technique, tactile cue for hand placement on RW  Ambulation/Gait Ambulation/Gait assistance: Min guard Ambulation Distance (Feet): 80 Feet Assistive device: Rolling walker (2 wheeled) Gait Pattern/deviations: Step-through pattern;Decreased stride length;Narrow base of support;Trunk flexed Gait velocity: slow Gait  velocity interpretation: Below normal speed for age/gender General Gait Details: vc for hand placement on RW, pt demo mild trunk flexion while ambulating, cues given to stay within close proximity to RW   Stairs Stairs: Yes Stairs assistance: Min guard Stair Management: Two rails;Backwards Number of Stairs: 3 (X2) General stair comments: min guard A for safety, vc for stepping with both feet on one step before advancing to the next step, cues for holding on to both railings, no LOB completing task. pt required seated rest break due to feeling fatiuged and hot.  Wheelchair Mobility    Modified Rankin (Stroke Patients Only)       Balance Overall balance assessment: Needs assistance Sitting-balance support: No upper extremity supported;Feet supported Sitting balance-[name]: Good Sitting balance - Comments: sitting on EOB while visitor assists pt to comb hair.   Standing balance support: Bilateral upper extremity supported;During functional activity Standing balance-[name]: Poor Standing balance comment: pt needs RW to maintain static balance                     Cognition Arousal/Alertness: Awake/alert Behavior During Therapy: WFL for tasks assessed/performed Overall Cognitive Status: Within Functional Limits for tasks assessed                      Exercises      General Comments General comments (skin integrity, edema, etc.): pt c/o of feeling hot during ambulation required at rest breat for energy conservation      Pertinent Vitals/Pain Pain Assessment: 0-10 Pain Score: 7  Faces Pain Scale: Hurts little more Pain Location: back Pain Descriptors / Indicators: Aching;Sore Pain Intervention(s): Monitored during session;Repositioned    Home Living  Prior Function            PT Goals (current goals can now be found in the care plan section) Acute Rehab PT Goals Patient Stated Goal: none stated during treatment  session Progress towards PT goals: Progressing toward goals    Frequency  Min 5X/week    PT Plan Discharge plan needs to be updated    Co-evaluation       OT goals addressed during session: Proper use of Adaptive equipment and DME;ADL's and self-care     End of Session Equipment Utilized During Treatment: Back brace Activity Tolerance: Patient limited by fatigue Patient left: in bed;with call bell/phone within reach;with bed alarm set;with family/visitor present     Time: 1131-1156 PT Time Calculation (min) (ACUTE ONLY): 25 min  Charges:  $Gait Training: 8-22 mins $Therapeutic Activity: 8-22 mins                    G Codes:      [name] 21-Jul-2015, 2:36 PM   [name] (student physical therapy assistant) Acute Rehab [phone]

## 2015-07-09 NOTE — Clinical Social Work Note (Signed)
CSW Consult Acknowledged:   CSW received a consult for SNF placement. Per PT/OT evaluation the appropriate level of care is home. Given that home is the recommendation CSW can not guarantee SNF placement. Full assessment to follow.      [place], MSW, [place]

## 2015-07-09 NOTE — Plan of Care (Signed)
Problem: Consults Goal: Diagnosis - Spinal Surgery Outcome: Completed/Met Date Met:  07/09/15 Thoraco/Lumbar Spine Fusion

## 2015-07-10 DIAGNOSIS — M4326 Fusion of spine, lumbar region: Secondary | ICD-10-CM | POA: Diagnosis not present

## 2015-07-10 DIAGNOSIS — M545 Low back pain: Secondary | ICD-10-CM | POA: Diagnosis not present

## 2015-07-10 DIAGNOSIS — M4126 Other idiopathic scoliosis, lumbar region: Secondary | ICD-10-CM | POA: Diagnosis not present

## 2015-07-10 DIAGNOSIS — D649 Anemia, unspecified: Secondary | ICD-10-CM | POA: Diagnosis not present

## 2015-07-10 DIAGNOSIS — M4806 Spinal stenosis, lumbar region: Secondary | ICD-10-CM | POA: Diagnosis not present

## 2015-07-10 DIAGNOSIS — M549 Dorsalgia, unspecified: Secondary | ICD-10-CM | POA: Diagnosis not present

## 2015-07-10 DIAGNOSIS — M4120 Other idiopathic scoliosis, site unspecified: Secondary | ICD-10-CM | POA: Diagnosis not present

## 2015-07-10 DIAGNOSIS — M5410 Radiculopathy, site unspecified: Secondary | ICD-10-CM | POA: Diagnosis not present

## 2015-07-10 DIAGNOSIS — M5416 Radiculopathy, lumbar region: Secondary | ICD-10-CM | POA: Diagnosis not present

## 2015-07-10 LAB — CBC
HCT: 29.2 % — ABNORMAL LOW (ref 36.0–46.0)
Hemoglobin: 9.5 g/dL — ABNORMAL LOW (ref 12.0–15.0)
MCH: 26.5 pg (ref 26.0–34.0)
MCHC: 32.5 g/dL (ref 30.0–36.0)
MCV: 81.6 fL (ref 78.0–100.0)
Platelets: 222 10*3/uL (ref 150–400)
RBC: 3.58 MIL/uL — ABNORMAL LOW (ref 3.87–5.11)
RDW: 14.4 % (ref 11.5–15.5)
WBC: 4.8 10*3/uL (ref 4.0–10.5)

## 2015-07-10 LAB — PROTIME-INR
INR: 1.15 (ref 0.00–1.49)
Prothrombin Time: 14.9 seconds (ref 11.6–15.2)

## 2015-07-10 NOTE — Progress Notes (Addendum)
Spoke with Verdis Prime from neurosurgery about patient's discharge anticoagulants.  Discussed and agreed that patient should be continued on Lovenox 40mg  subQ q24h until INR is therapeutic. Aaron Edelman stated this will be changed on discharge orders (Currently ordered as 110mg  which patient was on in preparation for surgery)  INR this morning is 1.15.  Recommend her to receive warfarin 10mg  x2 days (8/5 and 8/6) and then resume PT Awarfarin dosing starting 8/7- 5mg  daily except 7.5mg  on Mondays, Wednesdays and Fridays, Recommend an INR to be checked on Monday 8/8 or Tuesday 8/9 at SNF.  NO warfarin orders have been entered for patient to receive at the hospital tonight. Please call pharmacy if patient is still here at 1800 so a dose can be entered.  Norah Devin D. Shylin Keizer, PharmD, BCPS Clinical Pharmacist Pager: [phone] 07/10/2015 12:13 PM

## 2015-07-10 NOTE — Clinical Social Work Note (Signed)
Clinical Social Work Assessment  Patient Details  Name: [name] MRN: [id number] Date of Birth: 07/05/1941  Date of referral:  07/09/15               Reason for consult:  Facility Placement                Housing/Transportation Living arrangements for the past 2 months:  Single Family Home Source of Information:  Patient, Spouse Patient Interpreter Needed:  None Criminal Activity/Legal Involvement Pertinent to Current Situation/Hospitalization:  No - Comment as needed Significant Relationships:  Adult Children, Spouse Lives with:  Spouse Do you feel safe going back to the place where you live?  No Need for family participation in patient care:  Yes (Comment)  Care giving concerns:  N/A   Social Worker assessment / plan:  CSW met the pt and the pt's husband David at bedside. CSW introduced self and purpose of the visit. CSW discussed SNF rehab. CSW explained the SNF process. CSW explained insurance and its relation to SNF placement. CSW provided the pt with a SNF list. CSW and pt discussed geographic location in which the she would like to receive rehab. The pt reported that she will like to go to [facility] [place]. CSW answered all questions in which the pt inquired about. CSW will continue to follow this pt and assist with discharge as needed.   Employment status:  Retired Insurance information:  Managed Medicare PT Recommendations:  Skilled Nursing Facility Information / Referral to community resources:  Skilled Nursing Facility  Patient/Family's Response to care:  Pt reported that the care in which she received has been well.   Patient/Family's Understanding of and Emotional Response to Diagnosis, Current Treatment, and Prognosis:  Pt acknowledged her current treatment plan and diagnosis. Pt receptive to going to rehab.   Emotional Assessment Appearance:  Appears stated age Attitude/Demeanor/Rapport:   (Calm ) Affect (typically observed):  Pleasant Orientation:  Oriented  to Situation, Oriented to  Time, Oriented to Place, Oriented to Self Alcohol / Substance use:  Not Applicable Psych involvement (Current and /or in the community):  No (Comment)  Discharge Needs  Concerns to be addressed:  Denies Needs/Concerns at this time Readmission within the last 30 days:  No Current discharge risk:  None Barriers to Discharge:  No Barriers Identified   Bibbs, Dysheka, LCSW 07/10/2015, 7:46 AM  

## 2015-07-10 NOTE — Discharge Summary (Signed)
Physician Discharge Summary  Patient ID: [name] MRN: [id number] DOB/AGE: 1940-12-26 74 y.o.  Admit date: 07/03/2015 Discharge date: 07/10/2015  Admission Diagnoses: Scoliosis (and Kyphoscoliosis), Idiopathic; Spinal stenosis, Lumbar region; Radiculopathy, Lumbar region; Low back pain, L 1 - S 1 levels   Discharge Diagnoses: Scoliosis (and Kyphoscoliosis), Idiopathic; Spinal stenosis, Lumbar region; Radiculopathy, Lumbar region; Low back pain, L 1 - S 1 levels s/p Right Lumbar one-two, Lumbar two-three, Lumbar three-four, Lumbar four-five Anterior lateral lumbar interbody fusion (Right) - Right L1-2 L2-3 L3-4 L4-5 Anterior lateral lumbar interbody fusion Lumbar one-Sacral one Bilateral percutaneous pedicle screws (Right) - L1-S1 Bilateral percutaneous pedicle screws  Active Problems:   Lumbar scoliosis   Discharged Condition: good  Hospital Course: [name] was admitted for surgery with dx kyphoscoliosis, stenosis, and radiculopathy. Following uncomplicated antero-lateral decompression and fusion with posterior pedicle screws, she recovered well, transferring from Neuro PACU to Neuro ICU for initial monitoring of SpO2 levels and pain control. Pt then transferred to 4N for nursing care and therapies.  Recovery has been slow, balancing pain control with COPD and fluctuating O2 sats; but her strength and endurance continue to improve. She is now transitioning from Lovenox to Coumadin.   Consults: None  Significant Diagnostic Studies: radiology: X-Ray: intra-operative  Treatments: surgery: Right Lumbar one-two, Lumbar two-three, Lumbar three-four, Lumbar four-five Anterior lateral lumbar interbody fusion (Right) - Right L1-2 L2-3 L3-4 L4-5 Anterior lateral lumbar interbody fusion Lumbar one-Sacral one Bilateral percutaneous pedicle screws (Right) - L1-S1 Bilateral percutaneous pedicle screws   Discharge Exam: Blood pressure 121/64, pulse 87, temperature 98.5 F (36.9 C),  temperature source Oral, resp. rate 18, height 5\' 3"  (1.6 m), weight 64.411 kg (142 lb), SpO2 96 %. Alert, conversant. Reports persistent (though intermittent) lumbar pain. Still denies leg pain. Brother-in-law present voicing frustration with slow responses for bedpan/bathroom through the night. Pt agrees to plan for [facility] today to continue increasing activity/endurance before going to her home. incisions dry, without erythema beneath honeycomb dressings.   Disposition: Discharge to SNF - [facility]. Tizanidine & Norco Rx's to chart.  Will plan to continue resumption of Coumadin as instructed by pharmacy for goal of INR: 2-3. Pharmacy plan: Warfarin 7.5mg  PO x 1 tonight(07/09/15);  Daily INR, CBC in AM; DC lovenox when INR is therapeutic. Ok to remove honeycomb dressings at SNF, leaving Dermabond-covered incisions open to air.  She should return to office for f/u visit in 3-4 weeks.  Methocarbamol is stopped.  Coumadin 7.5mg  last night, continued dosing based on INR's, stopping Lovenox when therapeutic.        Medication List    TAKE these medications        albuterol 108 (90 BASE) MCG/ACT inhaler  Commonly known as:  PROVENTIL HFA;VENTOLIN HFA  Inhale 1-2 puffs into the lungs every 6 (six) hours as needed for wheezing or shortness of breath.     amLODipine 5 MG tablet  Commonly known as:  NORVASC  Take 1 tablet (5 mg total) by mouth every morning.     azelastine 0.1 % nasal spray  Commonly known as:  ASTELIN  Place 2 sprays into the nose daily. Use in each nostril as directed     budesonide-formoterol 160-4.5 MCG/ACT inhaler  Commonly known as:  SYMBICORT  Inhale 2 puffs into the lungs 2 (two) times daily.     CALCIUM + D PO  Take 1 tablet by mouth 2 (two) times daily.     citalopram 10 MG tablet  Commonly known as:  CELEXA  Take 1 tablet (10 mg total) by mouth every morning.     enoxaparin 100 MG/ML injection  Commonly known as:  LOVENOX  Inject 1 mL (100 mg total) into  the skin daily.     estradiol 0.0375 mg/24hr patch  Commonly known as:  CLIMARA - Dosed in mg/24 hr  Place 1 patch (0.0375 mg total) onto the skin once a week.     FLUTTER Devi  Use 2-3 times per day     irbesartan 150 MG tablet  Commonly known as:  AVAPRO  Take 1 tablet (150 mg total) by mouth every morning.     lidocaine 2 % jelly  Commonly known as:  XYLOCAINE JELLY  Apply 1 application topically as needed.     Magnesium 250 MG Tabs  Take 1 tablet by mouth daily as needed. Leg cramps     Melatonin 1 MG Tabs  Take 3 mg by mouth at bedtime.     meloxicam 15 MG tablet  Commonly known as:  MOBIC  Take 1 tablet (15 mg total) by mouth daily as needed for pain.     montelukast 10 MG tablet  Commonly known as:  SINGULAIR  Take 1 tablet (10 mg total) by mouth daily.     multivitamin tablet  Take 1 tablet by mouth daily.     omeprazole 20 MG capsule  Commonly known as:  PRILOSEC  Take 1 capsule (20 mg total) by mouth 2 (two) times daily before a meal.     Tiotropium Bromide Monohydrate 2.5 MCG/ACT Aers  Commonly known as:  SPIRIVA RESPIMAT  Inhale 2 puffs into the lungs daily.     VITAMIN E COMPLETE PO  Take 1 capsule by mouth daily. 400iu     warfarin 5 MG tablet  Commonly known as:  COUMADIN  Take as directed by anticoagulation clinic         Signed: Verdis Prime 07/10/2015, 9:11 AM

## 2015-07-10 NOTE — Progress Notes (Signed)
Subjective: Patient reports "I had another bad night.  The catheter came out when I was working with rehab and it's hard to get help quick enough to go to the bathroom"  Objective: Vital signs in last 24 hours: Temp:  [98.1 F (36.7 C)-98.8 F (37.1 C)] 98.5 F (36.9 C) (08/05 0519) Pulse Rate:  [72-89] 87 (08/05 0519) Resp:  [17-18] 18 (08/05 0519) BP: (97-121)/(45-72) 121/64 mmHg (08/05 0519) SpO2:  [94 %-98 %] 96 % (08/05 0519)  Intake/Output from previous day: 08/04 0701 - 08/05 0700 In: -  Out: 400 [Urine:400] Intake/Output this shift: Total I/O In: 180 [P.O.:180] Out: -   Alert, conversant. Reports persistent (though intermittent) lumbar pain. Still denies leg pain. Brother-in-law present voicing frustration with slow responses for bedpan/bathroom through the night. Pt agrees to plan for [facility] today to continue increasing activity/endurance before going to her home. incisions dry, without erythema beneath honeycomb dressings.  Lab Results:  Recent Labs  07/10/15 0502  WBC 4.8  HGB 9.5*  HCT 29.2*  PLT 222   BMET No results for input(s): NA, K, CL, CO2, GLUCOSE, BUN, CREATININE, CALCIUM in the last 72 hours.  Studies/Results: No results found.  Assessment/Plan: Improving   LOS: 7 days  Per Dr. Vertell Limber, d/c to SNF. FL-2 & Rx's for Norco & Tizanidine will be tubed to floor from OR. Will plan to continue resumption of Coumadin as instructed by pharmacy for goal of INR: 2-3. Pharmacy plan: Warfarin 7.5mg  PO x 1 tonight(07/09/15);  Daily INR, CBC in AM; DC lovenox when INR is therapeutic. Ok to remove honeycomb dressings at SNF, leaving Dermabond-covered incisions open to air.    Verdis Prime 07/10/2015, 8:30 AM

## 2015-07-10 NOTE — Clinical Social Work Placement (Signed)
   CLINICAL SOCIAL WORK PLACEMENT  NOTE  Date:  07/10/2015  Patient Details  Name: [name] MRN: [id number] Date of Birth: Dec 11, 1940  Clinical Social Work is seeking post-discharge placement for this patient at the [place] level of care (*CSW will initial, date and re-position this form in  chart as items are completed):  Yes   Patient/family provided with [place] Work Department's list of facilities offering this level of care within the geographic area requested by the patient (or if unable, by the patient's family).  Yes   Patient/family informed of their freedom to choose among providers that offer the needed level of care, that participate in Medicare, Medicaid or managed care program needed by the patient, have an available bed and are willing to accept the patient.  Yes   Patient/family informed of 's ownership interest in [facility] and [name], as well as of the fact that they are under no obligation to receive care at these facilities.  PASRR submitted to EDS on 07/10/15     PASRR number received on 07/10/15     Existing PASRR number confirmed on       FL2 transmitted to all facilities in geographic area requested by pt/family on 07/10/15     FL2 transmitted to all facilities within larger geographic area on       Patient informed that his/her managed care company has contracts with or will negotiate with certain facilities, including the following:        Yes   Patient/family informed of bed offers received.  Patient chooses bed at [facility]     Physician recommends and patient chooses bed at      Patient to be transferred to [place] on 07/10/15.  Patient to be transferred to facility by [facility]      Patient family notified on 07/10/15 of transfer.  Name of family member notified:  Shalese Strahan, husband      PHYSICIAN       Additional Comment:     _______________________________________________ Greta Doom, LCSW 07/10/2015, 7:49 AM

## 2015-07-10 NOTE — Progress Notes (Signed)
Discharge orders received, pt for discharge today to [facility].  IV D/C.  D/C instructions and Rx in packet for receiving facility and report called to SNF.  Family at the bedside to assist with discharge. PT[facility] brought pt downstairs via stretcher.

## 2015-07-12 DIAGNOSIS — D649 Anemia, unspecified: Secondary | ICD-10-CM | POA: Diagnosis not present

## 2015-07-16 DIAGNOSIS — M545 Low back pain: Secondary | ICD-10-CM | POA: Diagnosis not present

## 2015-07-16 DIAGNOSIS — M4120 Other idiopathic scoliosis, site unspecified: Secondary | ICD-10-CM | POA: Diagnosis not present

## 2015-07-16 DIAGNOSIS — M5410 Radiculopathy, site unspecified: Secondary | ICD-10-CM | POA: Diagnosis not present

## 2015-07-16 DIAGNOSIS — M4806 Spinal stenosis, lumbar region: Secondary | ICD-10-CM | POA: Diagnosis not present

## 2015-07-23 ENCOUNTER — Telehealth: Payer: Self-pay | Admitting: Internal Medicine

## 2015-07-23 ENCOUNTER — Telehealth: Payer: Self-pay | Admitting: General Practice

## 2015-07-23 DIAGNOSIS — M4120 Other idiopathic scoliosis, site unspecified: Secondary | ICD-10-CM | POA: Diagnosis not present

## 2015-07-23 DIAGNOSIS — M5410 Radiculopathy, site unspecified: Secondary | ICD-10-CM | POA: Diagnosis not present

## 2015-07-23 DIAGNOSIS — M4806 Spinal stenosis, lumbar region: Secondary | ICD-10-CM | POA: Diagnosis not present

## 2015-07-23 DIAGNOSIS — M545 Low back pain: Secondary | ICD-10-CM | POA: Diagnosis not present

## 2015-07-23 NOTE — Telephone Encounter (Signed)
Returned call to patient today 8/18.  Pt called to report that she is home from skilled nursing facility.  Patient reported INR of 3.2 today.  I gave her instructions to hold a dose of coumadin today and resume current dosage tomorrow.  Orders faxed to [facility] to check pt/inr in 1 week.  Patient thanked me for the call.

## 2015-07-23 NOTE — Telephone Encounter (Signed)
Pt would like cindy to return her call sort of urgent per pt.

## 2015-07-24 ENCOUNTER — Encounter: Payer: Self-pay | Admitting: Internal Medicine

## 2015-07-24 ENCOUNTER — Telehealth: Payer: Self-pay | Admitting: Internal Medicine

## 2015-07-24 DIAGNOSIS — Z4789 Encounter for other orthopedic aftercare: Secondary | ICD-10-CM | POA: Diagnosis not present

## 2015-07-24 DIAGNOSIS — I10 Essential (primary) hypertension: Secondary | ICD-10-CM | POA: Diagnosis not present

## 2015-07-24 DIAGNOSIS — M4126 Other idiopathic scoliosis, lumbar region: Secondary | ICD-10-CM | POA: Diagnosis not present

## 2015-07-24 DIAGNOSIS — M4806 Spinal stenosis, lumbar region: Secondary | ICD-10-CM | POA: Diagnosis not present

## 2015-07-24 DIAGNOSIS — M5416 Radiculopathy, lumbar region: Secondary | ICD-10-CM | POA: Diagnosis not present

## 2015-07-24 DIAGNOSIS — M4326 Fusion of spine, lumbar region: Secondary | ICD-10-CM | POA: Diagnosis not present

## 2015-07-24 NOTE — Telephone Encounter (Signed)
[place] with me, ok for verbal

## 2015-07-24 NOTE — Telephone Encounter (Signed)
[name] is calling for verbal orders for [name] to manage readings for PT and INR readings to report to our coumadin clinic.

## 2015-07-24 NOTE — Telephone Encounter (Signed)
OK for [facility] to report INR to [facility] Coumadin clinic?

## 2015-07-24 NOTE — Telephone Encounter (Signed)
HHRN advised via confidential VM

## 2015-07-28 DIAGNOSIS — M5416 Radiculopathy, lumbar region: Secondary | ICD-10-CM | POA: Diagnosis not present

## 2015-07-28 DIAGNOSIS — Z4789 Encounter for other orthopedic aftercare: Secondary | ICD-10-CM | POA: Diagnosis not present

## 2015-07-28 DIAGNOSIS — I10 Essential (primary) hypertension: Secondary | ICD-10-CM | POA: Diagnosis not present

## 2015-07-28 DIAGNOSIS — M4326 Fusion of spine, lumbar region: Secondary | ICD-10-CM | POA: Diagnosis not present

## 2015-07-28 DIAGNOSIS — M4126 Other idiopathic scoliosis, lumbar region: Secondary | ICD-10-CM | POA: Diagnosis not present

## 2015-07-28 DIAGNOSIS — M4806 Spinal stenosis, lumbar region: Secondary | ICD-10-CM | POA: Diagnosis not present

## 2015-07-29 ENCOUNTER — Telehealth: Payer: Self-pay

## 2015-07-29 DIAGNOSIS — M5416 Radiculopathy, lumbar region: Secondary | ICD-10-CM | POA: Diagnosis not present

## 2015-07-29 DIAGNOSIS — M4326 Fusion of spine, lumbar region: Secondary | ICD-10-CM | POA: Diagnosis not present

## 2015-07-29 DIAGNOSIS — Z4789 Encounter for other orthopedic aftercare: Secondary | ICD-10-CM | POA: Diagnosis not present

## 2015-07-29 DIAGNOSIS — M4126 Other idiopathic scoliosis, lumbar region: Secondary | ICD-10-CM | POA: Diagnosis not present

## 2015-07-29 DIAGNOSIS — M4806 Spinal stenosis, lumbar region: Secondary | ICD-10-CM | POA: Diagnosis not present

## 2015-07-29 DIAGNOSIS — I10 Essential (primary) hypertension: Secondary | ICD-10-CM | POA: Diagnosis not present

## 2015-07-30 ENCOUNTER — Telehealth: Payer: Self-pay

## 2015-07-30 ENCOUNTER — Ambulatory Visit (INDEPENDENT_AMBULATORY_CARE_PROVIDER_SITE_OTHER): Payer: Medicare Other | Admitting: General Practice

## 2015-07-30 DIAGNOSIS — I10 Essential (primary) hypertension: Secondary | ICD-10-CM | POA: Diagnosis not present

## 2015-07-30 DIAGNOSIS — M4126 Other idiopathic scoliosis, lumbar region: Secondary | ICD-10-CM | POA: Diagnosis not present

## 2015-07-30 DIAGNOSIS — I639 Cerebral infarction, unspecified: Secondary | ICD-10-CM

## 2015-07-30 DIAGNOSIS — Z4789 Encounter for other orthopedic aftercare: Secondary | ICD-10-CM | POA: Diagnosis not present

## 2015-07-30 DIAGNOSIS — M4806 Spinal stenosis, lumbar region: Secondary | ICD-10-CM | POA: Diagnosis not present

## 2015-07-30 DIAGNOSIS — M4326 Fusion of spine, lumbar region: Secondary | ICD-10-CM | POA: Diagnosis not present

## 2015-07-30 DIAGNOSIS — M5416 Radiculopathy, lumbar region: Secondary | ICD-10-CM | POA: Diagnosis not present

## 2015-07-30 LAB — POCT INR: INR: 3.5

## 2015-07-30 NOTE — Telephone Encounter (Signed)
Janett Billow with [facility] called to report that she was unable to get INR reading for this patient on 8/24----wanted me to advise cindy/coumadin clinic----i have advised cindy, she is to call [place] with [facility]

## 2015-07-30 NOTE — Progress Notes (Signed)
Pre visit review using our clinic review tool, if applicable. No additional management support is needed unless otherwise documented below in the visit note. 

## 2015-07-31 DIAGNOSIS — Z4789 Encounter for other orthopedic aftercare: Secondary | ICD-10-CM | POA: Diagnosis not present

## 2015-07-31 DIAGNOSIS — M4806 Spinal stenosis, lumbar region: Secondary | ICD-10-CM | POA: Diagnosis not present

## 2015-07-31 DIAGNOSIS — I10 Essential (primary) hypertension: Secondary | ICD-10-CM | POA: Diagnosis not present

## 2015-07-31 DIAGNOSIS — M5416 Radiculopathy, lumbar region: Secondary | ICD-10-CM | POA: Diagnosis not present

## 2015-07-31 DIAGNOSIS — M4326 Fusion of spine, lumbar region: Secondary | ICD-10-CM | POA: Diagnosis not present

## 2015-07-31 DIAGNOSIS — M4126 Other idiopathic scoliosis, lumbar region: Secondary | ICD-10-CM | POA: Diagnosis not present

## 2015-08-03 DIAGNOSIS — I10 Essential (primary) hypertension: Secondary | ICD-10-CM | POA: Diagnosis not present

## 2015-08-03 DIAGNOSIS — M4126 Other idiopathic scoliosis, lumbar region: Secondary | ICD-10-CM | POA: Diagnosis not present

## 2015-08-03 DIAGNOSIS — M4806 Spinal stenosis, lumbar region: Secondary | ICD-10-CM | POA: Diagnosis not present

## 2015-08-03 DIAGNOSIS — M4326 Fusion of spine, lumbar region: Secondary | ICD-10-CM | POA: Diagnosis not present

## 2015-08-03 DIAGNOSIS — M5416 Radiculopathy, lumbar region: Secondary | ICD-10-CM | POA: Diagnosis not present

## 2015-08-03 DIAGNOSIS — Z4789 Encounter for other orthopedic aftercare: Secondary | ICD-10-CM | POA: Diagnosis not present

## 2015-08-06 ENCOUNTER — Ambulatory Visit: Payer: Medicare Other | Admitting: Internal Medicine

## 2015-08-06 ENCOUNTER — Ambulatory Visit (INDEPENDENT_AMBULATORY_CARE_PROVIDER_SITE_OTHER): Payer: Medicare Other | Admitting: General Practice

## 2015-08-06 DIAGNOSIS — I639 Cerebral infarction, unspecified: Secondary | ICD-10-CM

## 2015-08-06 DIAGNOSIS — M4126 Other idiopathic scoliosis, lumbar region: Secondary | ICD-10-CM | POA: Diagnosis not present

## 2015-08-06 DIAGNOSIS — I10 Essential (primary) hypertension: Secondary | ICD-10-CM | POA: Diagnosis not present

## 2015-08-06 DIAGNOSIS — Z4789 Encounter for other orthopedic aftercare: Secondary | ICD-10-CM | POA: Diagnosis not present

## 2015-08-06 DIAGNOSIS — M5416 Radiculopathy, lumbar region: Secondary | ICD-10-CM | POA: Diagnosis not present

## 2015-08-06 DIAGNOSIS — M4326 Fusion of spine, lumbar region: Secondary | ICD-10-CM | POA: Diagnosis not present

## 2015-08-06 DIAGNOSIS — M4806 Spinal stenosis, lumbar region: Secondary | ICD-10-CM | POA: Diagnosis not present

## 2015-08-06 LAB — POCT INR: INR: 2.5

## 2015-08-06 NOTE — Progress Notes (Signed)
Pre visit review using our clinic review tool, if applicable. No additional management support is needed unless otherwise documented below in the visit note. 

## 2015-08-07 DIAGNOSIS — M542 Cervicalgia: Secondary | ICD-10-CM | POA: Diagnosis not present

## 2015-08-07 DIAGNOSIS — R51 Headache: Secondary | ICD-10-CM | POA: Diagnosis not present

## 2015-08-11 DIAGNOSIS — M5416 Radiculopathy, lumbar region: Secondary | ICD-10-CM | POA: Diagnosis not present

## 2015-08-11 DIAGNOSIS — M4326 Fusion of spine, lumbar region: Secondary | ICD-10-CM | POA: Diagnosis not present

## 2015-08-11 DIAGNOSIS — M4806 Spinal stenosis, lumbar region: Secondary | ICD-10-CM | POA: Diagnosis not present

## 2015-08-11 DIAGNOSIS — M4126 Other idiopathic scoliosis, lumbar region: Secondary | ICD-10-CM | POA: Diagnosis not present

## 2015-08-11 DIAGNOSIS — I10 Essential (primary) hypertension: Secondary | ICD-10-CM | POA: Diagnosis not present

## 2015-08-11 DIAGNOSIS — Z4789 Encounter for other orthopedic aftercare: Secondary | ICD-10-CM | POA: Diagnosis not present

## 2015-08-12 DIAGNOSIS — M7061 Trochanteric bursitis, right hip: Secondary | ICD-10-CM | POA: Diagnosis not present

## 2015-08-12 DIAGNOSIS — M4120 Other idiopathic scoliosis, site unspecified: Secondary | ICD-10-CM | POA: Diagnosis not present

## 2015-08-13 ENCOUNTER — Other Ambulatory Visit: Payer: Self-pay

## 2015-08-13 ENCOUNTER — Ambulatory Visit (INDEPENDENT_AMBULATORY_CARE_PROVIDER_SITE_OTHER): Payer: Medicare Other | Admitting: General Practice

## 2015-08-13 DIAGNOSIS — M4326 Fusion of spine, lumbar region: Secondary | ICD-10-CM | POA: Diagnosis not present

## 2015-08-13 DIAGNOSIS — I639 Cerebral infarction, unspecified: Secondary | ICD-10-CM

## 2015-08-13 DIAGNOSIS — M5416 Radiculopathy, lumbar region: Secondary | ICD-10-CM | POA: Diagnosis not present

## 2015-08-13 DIAGNOSIS — Z1231 Encounter for screening mammogram for malignant neoplasm of breast: Secondary | ICD-10-CM

## 2015-08-13 DIAGNOSIS — M4806 Spinal stenosis, lumbar region: Secondary | ICD-10-CM | POA: Diagnosis not present

## 2015-08-13 DIAGNOSIS — Z4789 Encounter for other orthopedic aftercare: Secondary | ICD-10-CM | POA: Diagnosis not present

## 2015-08-13 DIAGNOSIS — M4126 Other idiopathic scoliosis, lumbar region: Secondary | ICD-10-CM | POA: Diagnosis not present

## 2015-08-13 DIAGNOSIS — I10 Essential (primary) hypertension: Secondary | ICD-10-CM | POA: Diagnosis not present

## 2015-08-13 LAB — POCT INR: INR: 3.8

## 2015-08-13 NOTE — Progress Notes (Signed)
Pre visit review using our clinic review tool, if applicable. No additional management support is needed unless otherwise documented below in the visit note. 

## 2015-08-17 DIAGNOSIS — M4806 Spinal stenosis, lumbar region: Secondary | ICD-10-CM | POA: Diagnosis not present

## 2015-08-17 DIAGNOSIS — I10 Essential (primary) hypertension: Secondary | ICD-10-CM | POA: Diagnosis not present

## 2015-08-17 DIAGNOSIS — R351 Nocturia: Secondary | ICD-10-CM | POA: Diagnosis not present

## 2015-08-17 DIAGNOSIS — M4326 Fusion of spine, lumbar region: Secondary | ICD-10-CM | POA: Diagnosis not present

## 2015-08-17 DIAGNOSIS — M4126 Other idiopathic scoliosis, lumbar region: Secondary | ICD-10-CM | POA: Diagnosis not present

## 2015-08-17 DIAGNOSIS — M5416 Radiculopathy, lumbar region: Secondary | ICD-10-CM | POA: Diagnosis not present

## 2015-08-17 DIAGNOSIS — Z4789 Encounter for other orthopedic aftercare: Secondary | ICD-10-CM | POA: Diagnosis not present

## 2015-08-19 ENCOUNTER — Encounter: Payer: Self-pay | Admitting: Internal Medicine

## 2015-08-19 ENCOUNTER — Ambulatory Visit (INDEPENDENT_AMBULATORY_CARE_PROVIDER_SITE_OTHER): Payer: Medicare Other | Admitting: Internal Medicine

## 2015-08-19 VITALS — BP 114/62 | HR 51 | Temp 97.9°F | Ht 63.0 in | Wt 135.0 lb

## 2015-08-19 DIAGNOSIS — I639 Cerebral infarction, unspecified: Secondary | ICD-10-CM | POA: Diagnosis not present

## 2015-08-19 DIAGNOSIS — I10 Essential (primary) hypertension: Secondary | ICD-10-CM | POA: Diagnosis not present

## 2015-08-19 DIAGNOSIS — Z23 Encounter for immunization: Secondary | ICD-10-CM

## 2015-08-19 DIAGNOSIS — G459 Transient cerebral ischemic attack, unspecified: Secondary | ICD-10-CM

## 2015-08-19 DIAGNOSIS — F329 Major depressive disorder, single episode, unspecified: Secondary | ICD-10-CM

## 2015-08-19 DIAGNOSIS — E785 Hyperlipidemia, unspecified: Secondary | ICD-10-CM

## 2015-08-19 DIAGNOSIS — F32A Depression, unspecified: Secondary | ICD-10-CM

## 2015-08-19 MED ORDER — CYCLOBENZAPRINE HCL 5 MG PO TABS: 5.0000 mg | ORAL_TABLET | Freq: Three times a day (TID) | ORAL | Status: DC | PRN

## 2015-08-19 NOTE — Progress Notes (Signed)
Subjective:    Patient ID: [name], female    DOB: 04-27-1941, 74 y.o.   MRN: [id number]  HPI  Here to f/u, post lumbar fusion surgury and scoliosis repair, d/c aug 5 after somwhat extended hosp stay, in rehab x 2 wks, home for about 2-3 wks, getting PT at home, graduated from walker to cane only.  Has fallen twice at home apparently with lower blood pressure per pt since husband checked at the time - approx 88/44.  Pt attributes this to pain medication use since also had lower BP while hospd on pain med.  Kept takiing irbesartan and amlod since hosp d/c to date. Now more recently with more stamina overall, off pain medication, BP seems improved at home  No falls in past wk.  Does have bilat leg pain, asks for flexeril 5 mg as seemed to help before.  Off meloxicam as this was though important for 8 wks post surgical bone healing.  Asks to come back for CPX before end of 2016.  Has been seeing lipid clinic, but med cost > $600, so has been working on diet.  Wt overall down mostly prior and after surgury she attributes to the surgury. [place] [address] wt has been 155 for many years.  Wt Readings from Last 3 Encounters:  08/19/15 135 lb (61.236 kg)  07/03/15 142 lb (64.411 kg)  06/25/15 142 lb (64.411 kg)  Due for flu shot  Currnetly with full torso back brace turtle shell, to take off with sleep and shower.  Did also have right hip bursitis last 2 wks, s/p cortisone shot per ortho last wk per pt.  Denies worsening depressive symptoms, suicidal ideation, or panic Past Medical History  Diagnosis Date  . SBE (subacute bacterial endocarditis) prophylaxis candidate   . COPD (chronic obstructive pulmonary disease)   . GERD (gastroesophageal reflux disease)   . Fibrocystic breast disease   . HLD (hyperlipidemia)   . Osteoarthritis   . Hemorrhoids   . History of cerebrovascular accident   . TIA (transient ischemic attack) last 2001    anticoagulation therapy on coumadin  . HTN (hypertension)   .  Stroke     STROKES  . DUB (dysfunctional uterine bleeding)   . Atrophic vaginitis   . Menopausal symptoms   . DI (detrusor instability)   . Status post dilation of esophageal narrowing   . Depression 11/29/2013  . Nephrolithiasis     hx  . Pneumonia 2013  . Shingles since Nov 18, 2013    on right arm from shoulder to wrist  . Wears dentures     full upper, lower partial  . Wears glasses   . Dysrhythmia   . Shortness of breath dyspnea   . Anxiety   . History of kidney stones   . Basal cell carcinoma    Past Surgical History  Procedure Laterality Date  . Bladder suspension    . Excision of basal cell ca  2011  . Thumb surgery Right 10-15 yrs ago  . Rectocele repair  2008    A&P REPAIR -DR. MCDIARMID  . Anterior and posterior vaginal repair  2008    A&P REPAIR-DR MCDIARMID  . Colonoscopy    . Varicose vein surgery    . Upper gastrointestinal endoscopy    . Esophagogastroduodenoscopy N/A 03/03/2014    Procedure: ESOPHAGOGASTRODUODENOSCOPY (EGD);  Surgeon: Inda Castle, MD;  Location: [facility];  Service: Endoscopy;  Laterality: N/A;  . Balloon dilation N/A 03/03/2014  Procedure: BALLOON DILATION;  Surgeon: Inda Castle, MD;  Location: [facility];  Service: Endoscopy;  Laterality: N/A;  . Vaginal hysterectomy  1983    NO BSO-DR. MABRY  . Anterior lateral lumbar fusion 4 levels Right 07/03/2015    Procedure: Right Lumbar one-two, Lumbar two-three, Lumbar three-four, Lumbar four-five Anterior lateral lumbar interbody fusion;  Surgeon: Erline Levine, MD;  Location: [facility];  Service: Neurosurgery;  Laterality: Right;  Right L1-2 L2-3 L3-4 L4-5 Anterior lateral lumbar interbody fusion  . Lumbar percutaneous pedicle screw 4 level Right 07/03/2015    Procedure: Lumbar one-Sacral one Bilateral percutaneous pedicle screws;  Surgeon: Erline Levine, MD;  Location: [facility];  Service: Neurosurgery;  Laterality: Right;  L1-S1 Bilateral percutaneous pedicle screws     reports that she quit smoking about 19 years ago. Her smoking use included Cigarettes. She has a 60 pack-year smoking history. She has never used smokeless tobacco. She reports that she drinks alcohol. She reports that she does not use illicit drugs. family history includes Breast cancer in her maternal aunt and mother; COPD in her mother; Emphysema in her mother; Uterine cancer in her mother. There is no history of Colon cancer, Esophageal cancer, Diabetes, Kidney disease, Liver disease, Heart disease, Rectal cancer, or Stomach cancer. Allergies  Allergen Reactions  . Clarithromycin     REACTION: possible rash but could have been legionarres dz with rash  . Lipitor [Atorvastatin]     MUSCLE SPASMS  . Simvastatin   . Crestor [Rosuvastatin Calcium]     fagitue and joint pain, "NO STATINS"   Current Outpatient Prescriptions on File Prior to Visit  Medication Sig Dispense Refill  . albuterol (PROVENTIL HFA;VENTOLIN HFA) 108 (90 BASE) MCG/ACT inhaler Inhale 1-2 puffs into the lungs every 6 (six) hours as needed for wheezing or shortness of breath. 3 Inhaler 4  . amLODipine (NORVASC) 5 MG tablet Take 1 tablet (5 mg total) by mouth every morning. 90 tablet 3  . azelastine (ASTELIN) 137 MCG/SPRAY nasal spray Place 2 sprays into the nose daily. Use in each nostril as directed 90 mL 1  . budesonide-formoterol (SYMBICORT) 160-4.5 MCG/ACT inhaler Inhale 2 puffs into the lungs 2 (two) times daily. 3 Inhaler 4  . Calcium Carbonate-Vitamin D (CALCIUM + D PO) Take 1 tablet by mouth 2 (two) times daily.     . citalopram (CELEXA) 10 MG tablet Take 1 tablet (10 mg total) by mouth every morning. 90 tablet 3  . estradiol (CLIMARA - DOSED IN MG/24 HR) 0.0375 mg/24hr patch Place 1 patch (0.0375 mg total) onto the skin once a week. (Patient taking differently: Place 0.0375 mg onto the skin once a week. Put on Saturdays) 12 patch 4  . irbesartan (AVAPRO) 150 MG tablet Take 1 tablet (150 mg total) by mouth every  morning. 90 tablet 3  . lidocaine (XYLOCAINE JELLY) 2 % jelly Apply 1 application topically as needed. (Patient taking differently: Apply 1 application topically as needed. For shingles) 30 mL 1  . Magnesium 250 MG TABS Take 1 tablet by mouth daily as needed. Leg cramps    . Melatonin 1 MG TABS Take 3 mg by mouth at bedtime.     . meloxicam (MOBIC) 15 MG tablet Take 1 tablet (15 mg total) by mouth daily as needed for pain. 90 tablet 3  . montelukast (SINGULAIR) 10 MG tablet Take 1 tablet (10 mg total) by mouth daily. 90 tablet 3  . Multiple Vitamin (MULTIVITAMIN) tablet Take 1 tablet by  mouth daily.      Marland Kitchen omeprazole (PRILOSEC) 20 MG capsule Take 1 capsule (20 mg total) by mouth 2 (two) times daily before a meal. (Patient taking differently: Take 20 mg by mouth daily. ) 180 capsule 3  . Respiratory Therapy Supplies (FLUTTER) DEVI Use 2-3 times per day 1 each 0  . Tiotropium Bromide Monohydrate (SPIRIVA RESPIMAT) 2.5 MCG/ACT AERS Inhale 2 puffs into the lungs daily. 3 Inhaler 4  . Vitamin Mixture (VITAMIN E COMPLETE PO) Take 1 capsule by mouth daily. 400iu    . warfarin (COUMADIN) 5 MG tablet Take as directed by anticoagulation clinic (Patient taking differently: Take 2.5-5 mg by mouth See admin instructions. Take as directed by anticoagulation clinic) 180 tablet 1  . [DISCONTINUED] valsartan (DIOVAN) 160 MG tablet Take 1 tablet (160 mg total) by mouth daily. 90 tablet 3   No current facility-administered medications on file prior to visit.   Review of Systems  Constitutional: Negative for unusual diaphoresis or night sweats HENT: Negative for ringing in ear or discharge Eyes: Negative for double vision or worsening visual disturbance.  Respiratory: Negative for choking and stridor.   Gastrointestinal: Negative for vomiting or other signifcant bowel change Genitourinary: Negative for hematuria or change in urine volume.  Musculoskeletal: Negative for other MSK pain or swelling Skin: Negative  for color change and worsening wound.  Neurological: Negative for tremors and numbness other than noted  Psychiatric/Behavioral: Negative for decreased concentration or agitation other than above       Objective:   Physical Exam BP 114/62 mmHg  Pulse 51  Temp(Src) 97.9 F (36.6 C) (Oral)  Ht 5\' 3"  (1.6 m)  Wt 135 lb (61.236 kg)  BMI 23.92 kg/m2  SpO2 96% VS noted,  Constitutional: Pt appears in no significant distress HENT: Head: NCAT.  Right Ear: External ear normal.  Left Ear: External ear normal.  Eyes: . Pupils are equal, round, and reactive to light. Conjunctivae and EOM are normal Neck: Normal range of motion. Neck supple.  Cardiovascular: Normal rate and regular rhythm.   Pulmonary/Chest: Effort normal and breath sounds without rales or wheezing.  Abd:  Not examined weearing turtle shell brace Neurological: Pt is alert. Not confused , motor grossly intact Skin: Skin is warm. No rash, no LE edema Psychiatric: Pt behavior is normal. No agitation. not depressed affect     Assessment & Plan:

## 2015-08-19 NOTE — Progress Notes (Signed)
Pre visit review using our clinic review tool, if applicable. No additional management support is needed unless otherwise documented below in the visit note. 

## 2015-08-19 NOTE — Patient Instructions (Signed)
Please continue all other medications as before, and refills have been done if requested.  Please have the pharmacy call with any other refills you may need.  Please continue your efforts at being more active, low cholesterol diet, and weight control.  Please keep your appointments with your specialists as you may have planned  Please return in 3 months, or sooner if needed 

## 2015-08-20 ENCOUNTER — Ambulatory Visit (INDEPENDENT_AMBULATORY_CARE_PROVIDER_SITE_OTHER): Payer: Medicare Other | Admitting: General Practice

## 2015-08-20 DIAGNOSIS — M4806 Spinal stenosis, lumbar region: Secondary | ICD-10-CM | POA: Diagnosis not present

## 2015-08-20 DIAGNOSIS — M4326 Fusion of spine, lumbar region: Secondary | ICD-10-CM | POA: Diagnosis not present

## 2015-08-20 DIAGNOSIS — M5416 Radiculopathy, lumbar region: Secondary | ICD-10-CM | POA: Diagnosis not present

## 2015-08-20 DIAGNOSIS — I639 Cerebral infarction, unspecified: Secondary | ICD-10-CM

## 2015-08-20 DIAGNOSIS — Z4789 Encounter for other orthopedic aftercare: Secondary | ICD-10-CM | POA: Diagnosis not present

## 2015-08-20 DIAGNOSIS — M4126 Other idiopathic scoliosis, lumbar region: Secondary | ICD-10-CM | POA: Diagnosis not present

## 2015-08-20 DIAGNOSIS — M19041 Primary osteoarthritis, right hand: Secondary | ICD-10-CM | POA: Diagnosis not present

## 2015-08-20 DIAGNOSIS — I10 Essential (primary) hypertension: Secondary | ICD-10-CM | POA: Diagnosis not present

## 2015-08-20 LAB — POCT INR: INR: 2.8

## 2015-08-20 NOTE — Progress Notes (Signed)
Pre visit review using our clinic review tool, if applicable. No additional management support is needed unless otherwise documented below in the visit note. 

## 2015-08-23 NOTE — Assessment & Plan Note (Signed)
stable overall by history and exam,  and pt to continue medical treatment as before,  to f/u any worsening symptoms or concerns, cont coumadin

## 2015-08-23 NOTE — Assessment & Plan Note (Signed)
stable overall by history and exam, recent data reviewed with pt, and pt to continue medical treatment as before,  to f/u any worsening symptoms or concerns Lab Results  Component Value Date   WBC 4.8 07/10/2015   HGB 9.5* 07/10/2015   HCT 29.2* 07/10/2015   PLT 222 07/10/2015   GLUCOSE 107* 06/25/2015   CHOL 256* 03/09/2015   TRIG 80.0 03/09/2015   HDL 62.10 03/09/2015   LDLDIRECT 241.1 12/02/2014   LDLCALC 178* 03/09/2015   ALT 28 12/02/2014   AST 24 12/02/2014   NA 139 06/25/2015   K 4.5 06/25/2015   CL 106 06/25/2015   CREATININE 1.00 06/25/2015   BUN 34* 06/25/2015   CO2 24 06/25/2015   TSH 1.65 12/02/2014   INR 2.8 08/20/2015   HGBA1C 6.2 12/02/2014

## 2015-08-23 NOTE — Assessment & Plan Note (Signed)
Persistent elev,  Lab Results  Component Value Date   CHOL 256* 03/09/2015   HDL 62.10 03/09/2015   LDLCALC 178* 03/09/2015   LDLDIRECT 241.1 12/02/2014   TRIG 80.0 03/09/2015   CHOLHDL 4 03/09/2015   To cont lower chol diet

## 2015-08-23 NOTE — Assessment & Plan Note (Signed)
stable overall by history and exam, recent data reviewed with pt, and pt to continue medical treatment as before,  to f/u any worsening symptoms or concerns BP Readings from Last 3 Encounters:  08/19/15 114/62  07/10/15 106/59  06/25/15 143/81

## 2015-08-25 DIAGNOSIS — M4126 Other idiopathic scoliosis, lumbar region: Secondary | ICD-10-CM | POA: Diagnosis not present

## 2015-08-25 DIAGNOSIS — M4326 Fusion of spine, lumbar region: Secondary | ICD-10-CM | POA: Diagnosis not present

## 2015-08-25 DIAGNOSIS — M5416 Radiculopathy, lumbar region: Secondary | ICD-10-CM | POA: Diagnosis not present

## 2015-08-25 DIAGNOSIS — Z4789 Encounter for other orthopedic aftercare: Secondary | ICD-10-CM | POA: Diagnosis not present

## 2015-08-25 DIAGNOSIS — I10 Essential (primary) hypertension: Secondary | ICD-10-CM | POA: Diagnosis not present

## 2015-08-25 DIAGNOSIS — M4806 Spinal stenosis, lumbar region: Secondary | ICD-10-CM | POA: Diagnosis not present

## 2015-08-27 DIAGNOSIS — M4806 Spinal stenosis, lumbar region: Secondary | ICD-10-CM | POA: Diagnosis not present

## 2015-08-27 DIAGNOSIS — M4326 Fusion of spine, lumbar region: Secondary | ICD-10-CM | POA: Diagnosis not present

## 2015-08-27 DIAGNOSIS — Z4789 Encounter for other orthopedic aftercare: Secondary | ICD-10-CM | POA: Diagnosis not present

## 2015-08-27 DIAGNOSIS — M5416 Radiculopathy, lumbar region: Secondary | ICD-10-CM | POA: Diagnosis not present

## 2015-08-27 DIAGNOSIS — I10 Essential (primary) hypertension: Secondary | ICD-10-CM | POA: Diagnosis not present

## 2015-08-27 DIAGNOSIS — M4126 Other idiopathic scoliosis, lumbar region: Secondary | ICD-10-CM | POA: Diagnosis not present

## 2015-09-01 ENCOUNTER — Encounter: Payer: Self-pay | Admitting: Adult Health

## 2015-09-01 ENCOUNTER — Encounter: Payer: Self-pay | Admitting: Critical Care Medicine

## 2015-09-01 DIAGNOSIS — M4126 Other idiopathic scoliosis, lumbar region: Secondary | ICD-10-CM | POA: Diagnosis not present

## 2015-09-01 DIAGNOSIS — M5416 Radiculopathy, lumbar region: Secondary | ICD-10-CM | POA: Diagnosis not present

## 2015-09-01 DIAGNOSIS — Z4789 Encounter for other orthopedic aftercare: Secondary | ICD-10-CM | POA: Diagnosis not present

## 2015-09-01 DIAGNOSIS — M4806 Spinal stenosis, lumbar region: Secondary | ICD-10-CM | POA: Diagnosis not present

## 2015-09-01 DIAGNOSIS — I10 Essential (primary) hypertension: Secondary | ICD-10-CM | POA: Diagnosis not present

## 2015-09-01 DIAGNOSIS — M4326 Fusion of spine, lumbar region: Secondary | ICD-10-CM | POA: Diagnosis not present

## 2015-09-03 DIAGNOSIS — M4806 Spinal stenosis, lumbar region: Secondary | ICD-10-CM | POA: Diagnosis not present

## 2015-09-03 DIAGNOSIS — I10 Essential (primary) hypertension: Secondary | ICD-10-CM | POA: Diagnosis not present

## 2015-09-03 DIAGNOSIS — Z4789 Encounter for other orthopedic aftercare: Secondary | ICD-10-CM | POA: Diagnosis not present

## 2015-09-03 DIAGNOSIS — M5416 Radiculopathy, lumbar region: Secondary | ICD-10-CM | POA: Diagnosis not present

## 2015-09-03 DIAGNOSIS — M4126 Other idiopathic scoliosis, lumbar region: Secondary | ICD-10-CM | POA: Diagnosis not present

## 2015-09-03 DIAGNOSIS — M4326 Fusion of spine, lumbar region: Secondary | ICD-10-CM | POA: Diagnosis not present

## 2015-09-04 ENCOUNTER — Telehealth: Payer: Self-pay | Admitting: Internal Medicine

## 2015-09-04 DIAGNOSIS — R351 Nocturia: Secondary | ICD-10-CM | POA: Diagnosis not present

## 2015-09-04 DIAGNOSIS — N200 Calculus of kidney: Secondary | ICD-10-CM | POA: Diagnosis not present

## 2015-09-04 LAB — POCT INR: INR: 4.1

## 2015-09-04 NOTE — Telephone Encounter (Signed)
Patient has called back in regards.  I told her we would call her as soon as possible.

## 2015-09-04 NOTE — Telephone Encounter (Signed)
Talked with jessica/[facility], patients inr value was 4.1---advised [place] to instruct patient to hold 7.5 mg tab today and then recheck inr value next week as usual---jessica repeated back for understanding and advised that she would call patient

## 2015-09-04 NOTE — Telephone Encounter (Signed)
Agree with the plan of skipping dose today and then resume. Recheck as scheduled.

## 2015-09-04 NOTE — Telephone Encounter (Signed)
Patient states her home health care came out yesterday. States her INR was 4.1.  She states that the home health care called [facility] yesterday.  Im not sure if Jenny Reichmann was in yesterday or not.  She is requesting a call back this morning on what she needs to do.

## 2015-09-04 NOTE — Telephone Encounter (Signed)
[name] from [place] was waiting for a telephone call back from Duncan with [facility] about her the patient's INR results.  The results were high yesterday and they need a new order of what she needs to take and when to recheck her levels again.

## 2015-09-04 NOTE — Telephone Encounter (Signed)
Marya Amsler, patient's inr goal is 2.0 - 3.0---I will verify with jessica/[facility] that inr value was 4.1 yesterday---if that value is correct, I recommend that patient stop her 7.5 mg tab today (friday) and then begin normal dosage instructions on Saturday-----patients current/normal dosage schedule is one 5 mg tab every day except 7.5 mg (or 1 1/2 tabs) on Monday's and Friday's--please advise, thanks

## 2015-09-07 ENCOUNTER — Ambulatory Visit (INDEPENDENT_AMBULATORY_CARE_PROVIDER_SITE_OTHER): Payer: Medicare Other | Admitting: General Practice

## 2015-09-07 NOTE — Progress Notes (Signed)
Pre visit review using our clinic review tool, if applicable. No additional management support is needed unless otherwise documented below in the visit note. 

## 2015-09-08 ENCOUNTER — Encounter: Payer: Self-pay | Admitting: Adult Health

## 2015-09-08 DIAGNOSIS — M4806 Spinal stenosis, lumbar region: Secondary | ICD-10-CM | POA: Diagnosis not present

## 2015-09-08 DIAGNOSIS — M4326 Fusion of spine, lumbar region: Secondary | ICD-10-CM | POA: Diagnosis not present

## 2015-09-08 DIAGNOSIS — M5416 Radiculopathy, lumbar region: Secondary | ICD-10-CM | POA: Diagnosis not present

## 2015-09-08 DIAGNOSIS — M4126 Other idiopathic scoliosis, lumbar region: Secondary | ICD-10-CM | POA: Diagnosis not present

## 2015-09-08 DIAGNOSIS — I10 Essential (primary) hypertension: Secondary | ICD-10-CM | POA: Diagnosis not present

## 2015-09-08 DIAGNOSIS — Z4789 Encounter for other orthopedic aftercare: Secondary | ICD-10-CM | POA: Diagnosis not present

## 2015-09-09 NOTE — Progress Notes (Signed)
I have reviewed and agree with the plan. 

## 2015-09-10 ENCOUNTER — Telehealth: Payer: Self-pay | Admitting: Gastroenterology

## 2015-09-10 ENCOUNTER — Telehealth: Payer: Self-pay | Admitting: Internal Medicine

## 2015-09-10 DIAGNOSIS — M4326 Fusion of spine, lumbar region: Secondary | ICD-10-CM | POA: Diagnosis not present

## 2015-09-10 DIAGNOSIS — M4806 Spinal stenosis, lumbar region: Secondary | ICD-10-CM | POA: Diagnosis not present

## 2015-09-10 DIAGNOSIS — Z4789 Encounter for other orthopedic aftercare: Secondary | ICD-10-CM | POA: Diagnosis not present

## 2015-09-10 DIAGNOSIS — I10 Essential (primary) hypertension: Secondary | ICD-10-CM | POA: Diagnosis not present

## 2015-09-10 DIAGNOSIS — M5416 Radiculopathy, lumbar region: Secondary | ICD-10-CM | POA: Diagnosis not present

## 2015-09-10 DIAGNOSIS — M4126 Other idiopathic scoliosis, lumbar region: Secondary | ICD-10-CM | POA: Diagnosis not present

## 2015-09-10 NOTE — Telephone Encounter (Signed)
Langley Gauss called from [facility], 09/10/15 PT 29.9, INR 2.5, doesage Monday and Friday is 7.5 and other day is 5 mg. Any question please give her a call back at [phone].

## 2015-09-10 NOTE — Telephone Encounter (Signed)
Okay 

## 2015-09-11 NOTE — Telephone Encounter (Signed)
Called denise with [facility], inr is 2.5, patient goal range is 2.0 - 3.0, patient is within goal range----denise to advise patient to remain on same dosing schedule and recheck INR on Tuesday 10/11 and call our office to report to cindy/[facility]madin clinic for further advisement

## 2015-09-15 ENCOUNTER — Telehealth: Payer: Self-pay | Admitting: Internal Medicine

## 2015-09-15 ENCOUNTER — Ambulatory Visit (INDEPENDENT_AMBULATORY_CARE_PROVIDER_SITE_OTHER): Payer: Medicare Other | Admitting: General Practice

## 2015-09-15 DIAGNOSIS — I10 Essential (primary) hypertension: Secondary | ICD-10-CM | POA: Diagnosis not present

## 2015-09-15 DIAGNOSIS — M4126 Other idiopathic scoliosis, lumbar region: Secondary | ICD-10-CM | POA: Diagnosis not present

## 2015-09-15 DIAGNOSIS — Z4789 Encounter for other orthopedic aftercare: Secondary | ICD-10-CM | POA: Diagnosis not present

## 2015-09-15 DIAGNOSIS — M4806 Spinal stenosis, lumbar region: Secondary | ICD-10-CM | POA: Diagnosis not present

## 2015-09-15 DIAGNOSIS — M4326 Fusion of spine, lumbar region: Secondary | ICD-10-CM | POA: Diagnosis not present

## 2015-09-15 DIAGNOSIS — M5416 Radiculopathy, lumbar region: Secondary | ICD-10-CM | POA: Diagnosis not present

## 2015-09-15 LAB — POCT INR: INR: 3.3

## 2015-09-15 NOTE — Telephone Encounter (Signed)
Janett Billow from [place] called to let you know pt's PT is 39.4 INR 3.3  Last week she continued the same dose of 7.5 mg on Monday and Friday - 5 mg all other days She will be discharged on Thursday so she will then need to come in to office  Can you please call Janett Billow at [phone]

## 2015-09-15 NOTE — Progress Notes (Signed)
I have reviewed and agree with the plan. 

## 2015-09-15 NOTE — Progress Notes (Signed)
Pre visit review using our clinic review tool, if applicable. No additional management support is needed unless otherwise documented below in the visit note. 

## 2015-09-16 DIAGNOSIS — I10 Essential (primary) hypertension: Secondary | ICD-10-CM | POA: Diagnosis not present

## 2015-09-16 DIAGNOSIS — Z6825 Body mass index (BMI) 25.0-25.9, adult: Secondary | ICD-10-CM | POA: Diagnosis not present

## 2015-09-16 DIAGNOSIS — M4120 Other idiopathic scoliosis, site unspecified: Secondary | ICD-10-CM | POA: Diagnosis not present

## 2015-09-16 DIAGNOSIS — M545 Low back pain: Secondary | ICD-10-CM | POA: Diagnosis not present

## 2015-09-17 DIAGNOSIS — M4326 Fusion of spine, lumbar region: Secondary | ICD-10-CM | POA: Diagnosis not present

## 2015-09-17 DIAGNOSIS — I10 Essential (primary) hypertension: Secondary | ICD-10-CM | POA: Diagnosis not present

## 2015-09-17 DIAGNOSIS — M4806 Spinal stenosis, lumbar region: Secondary | ICD-10-CM | POA: Diagnosis not present

## 2015-09-17 DIAGNOSIS — M4126 Other idiopathic scoliosis, lumbar region: Secondary | ICD-10-CM | POA: Diagnosis not present

## 2015-09-17 DIAGNOSIS — M5416 Radiculopathy, lumbar region: Secondary | ICD-10-CM | POA: Diagnosis not present

## 2015-09-17 DIAGNOSIS — Z4789 Encounter for other orthopedic aftercare: Secondary | ICD-10-CM | POA: Diagnosis not present

## 2015-09-21 ENCOUNTER — Ambulatory Visit: Payer: Medicare Other

## 2015-09-21 ENCOUNTER — Encounter: Payer: Self-pay | Admitting: Adult Health

## 2015-09-21 ENCOUNTER — Ambulatory Visit (INDEPENDENT_AMBULATORY_CARE_PROVIDER_SITE_OTHER): Payer: Medicare Other | Admitting: Adult Health

## 2015-09-21 VITALS — BP 118/64 | HR 61 | Ht 63.0 in | Wt 139.0 lb

## 2015-09-21 DIAGNOSIS — J441 Chronic obstructive pulmonary disease with (acute) exacerbation: Secondary | ICD-10-CM

## 2015-09-21 DIAGNOSIS — I639 Cerebral infarction, unspecified: Secondary | ICD-10-CM

## 2015-09-21 MED ORDER — LEVALBUTEROL HCL 0.63 MG/3ML IN NEBU
0.6300 mg | INHALATION_SOLUTION | Freq: Once | RESPIRATORY_TRACT | Status: AC
  Administered 2015-09-21: 0.63 mg via RESPIRATORY_TRACT

## 2015-09-21 MED ORDER — AMOXICILLIN-POT CLAVULANATE 875-125 MG PO TABS: 1.0000 | ORAL_TABLET | Freq: Two times a day (BID) | ORAL | Status: AC

## 2015-09-21 NOTE — Progress Notes (Signed)
   Subjective:    Patient ID: [name], female    DOB: February 27, 1941, 74 y.o.   MRN: [id number]  HPI  74 yo female with COPD  Previous pt of Dr. Joya Gaskins    TEST :  12/2011 with FEV1 62%, ratio , FVC 104% , LDCO 69%,  S/p pulm rehab 2015    09/21/2015 Acute OV : COPD  Pt presents for an acute office visit. She complains of 1 week of productive cough , congestion w/ thick mucus. Denies wheezing , hemoptysis , chest pain , orthopnea, edema or fever.  Cough is bad at times. Congestion is so thick she gets strangled coughing.  Not using any cough meds. Says she has been doing very well with her breathing up until last 1 week.  Says she feels spiriva has really helped her so much .  Remains on Symbicort and Spiriva .  Had back surgery in August. ,feels she has done very well. Pain has resolved.   Review of Systems Constitutional:   No  weight loss, night sweats,  Fevers, chills, fatigue, or  lassitude.  HEENT:   No headaches,  Difficulty swallowing,  Tooth/dental problems, or  Sore throat,                No sneezing, itching, ear ache,  +nasal congestion, post nasal drip,   CV:  No chest pain,  Orthopnea, PND, swelling in lower extremities, anasarca, dizziness, palpitations, syncope.   GI  No heartburn, indigestion, abdominal pain, nausea, vomiting, diarrhea, change in bowel habits, loss of appetite, bloody stools.   Resp:  .  No chest wall deformity  Skin: no rash or lesions.  GU: no dysuria, change in color of urine, no urgency or frequency.  No flank pain, no hematuria   MS:  No joint pain or swelling.  No decreased range of motion.  No back pain.  Psych:  No change in mood or affect. No depression or anxiety.  No memory loss.         Objective:   Physical Exam GEN: A/Ox3; pleasant , NAD, elderly   HEENT:  [place]/AT,  EACs-clear, TMs-wnl, NOSE-clear, THROAT-clear, no lesions, no postnasal drip or exudate noted.   NECK:  Supple w/ fair ROM; no JVD; normal carotid impulses  w/o bruits; no thyromegaly or nodules palpated; no lymphadenopathy.  RESP  Clear  P & A; w/o, wheezes/ rales/ or rhonchi.no accessory muscle use, no dullness to percussion  CARD:  RRR, no m/r/g  , no peripheral edema, pulses intact, no cyanosis or clubbing.  GI:   Soft & nt; nml bowel sounds; no organomegaly or masses detected.  Musco: Warm bil, no deformities or joint swelling noted.  Arthritic changes of the hands   Neuro: alert, no focal deficits noted.    Skin: Warm, no lesions or rashes         Assessment & Plan:

## 2015-09-21 NOTE — Assessment & Plan Note (Signed)
Flare with URI/bronchiits  xopenex neb x 1   Plan  Augmentin 875mg  Twice daily  For 7 days .  Mucinex DM Twice daily  As needed  Cough/congestion  Follow up Dr. Ashok Cordia in 3 months and As needed   Please contact office for sooner follow up if symptoms do not improve or worsen or seek emergency care

## 2015-09-21 NOTE — Addendum Note (Signed)
Addended by: Maryanna Shape A on: 09/21/2015 11:48 AM   Modules accepted: Orders

## 2015-09-21 NOTE — Patient Instructions (Signed)
Augmentin 875mg  Twice daily  For 7 days .  Mucinex DM Twice daily  As needed  Cough/congestion  Follow up Dr. Ashok Cordia in 3 months and As needed   Please contact office for sooner follow up if symptoms do not improve or worsen or seek emergency care

## 2015-09-24 DIAGNOSIS — M6281 Muscle weakness (generalized): Secondary | ICD-10-CM | POA: Diagnosis not present

## 2015-09-24 DIAGNOSIS — S335XXD Sprain of ligaments of lumbar spine, subsequent encounter: Secondary | ICD-10-CM | POA: Diagnosis not present

## 2015-09-28 DIAGNOSIS — S335XXD Sprain of ligaments of lumbar spine, subsequent encounter: Secondary | ICD-10-CM | POA: Diagnosis not present

## 2015-09-28 DIAGNOSIS — M6281 Muscle weakness (generalized): Secondary | ICD-10-CM | POA: Diagnosis not present

## 2015-09-29 ENCOUNTER — Ambulatory Visit (INDEPENDENT_AMBULATORY_CARE_PROVIDER_SITE_OTHER): Payer: Medicare Other | Admitting: General Practice

## 2015-09-29 DIAGNOSIS — Z8679 Personal history of other diseases of the circulatory system: Secondary | ICD-10-CM | POA: Diagnosis not present

## 2015-09-29 DIAGNOSIS — G459 Transient cerebral ischemic attack, unspecified: Secondary | ICD-10-CM

## 2015-09-29 DIAGNOSIS — Z7901 Long term (current) use of anticoagulants: Secondary | ICD-10-CM

## 2015-09-29 LAB — POCT INR: INR: 2

## 2015-09-29 NOTE — Progress Notes (Signed)
I have reviewed and agree with the plan. 

## 2015-09-29 NOTE — Progress Notes (Signed)
Pre visit review using our clinic review tool, if applicable. No additional management support is needed unless otherwise documented below in the visit note. 

## 2015-09-30 ENCOUNTER — Encounter: Payer: Self-pay | Admitting: *Deleted

## 2015-10-01 DIAGNOSIS — M6281 Muscle weakness (generalized): Secondary | ICD-10-CM | POA: Diagnosis not present

## 2015-10-01 DIAGNOSIS — S335XXD Sprain of ligaments of lumbar spine, subsequent encounter: Secondary | ICD-10-CM | POA: Diagnosis not present

## 2015-10-05 DIAGNOSIS — R351 Nocturia: Secondary | ICD-10-CM | POA: Diagnosis not present

## 2015-10-06 DIAGNOSIS — M6281 Muscle weakness (generalized): Secondary | ICD-10-CM | POA: Diagnosis not present

## 2015-10-06 DIAGNOSIS — S335XXD Sprain of ligaments of lumbar spine, subsequent encounter: Secondary | ICD-10-CM | POA: Diagnosis not present

## 2015-10-12 ENCOUNTER — Encounter: Payer: Self-pay | Admitting: Gynecology

## 2015-10-12 ENCOUNTER — Ambulatory Visit (INDEPENDENT_AMBULATORY_CARE_PROVIDER_SITE_OTHER): Payer: Medicare Other | Admitting: Gynecology

## 2015-10-12 VITALS — BP 124/74 | Ht 64.0 in | Wt 143.0 lb

## 2015-10-12 DIAGNOSIS — I639 Cerebral infarction, unspecified: Secondary | ICD-10-CM | POA: Diagnosis not present

## 2015-10-12 DIAGNOSIS — Z01419 Encounter for gynecological examination (general) (routine) without abnormal findings: Secondary | ICD-10-CM | POA: Diagnosis not present

## 2015-10-12 DIAGNOSIS — N8111 Cystocele, midline: Secondary | ICD-10-CM

## 2015-10-12 DIAGNOSIS — N318 Other neuromuscular dysfunction of bladder: Secondary | ICD-10-CM | POA: Diagnosis not present

## 2015-10-12 DIAGNOSIS — N952 Postmenopausal atrophic vaginitis: Secondary | ICD-10-CM | POA: Diagnosis not present

## 2015-10-12 DIAGNOSIS — N3281 Overactive bladder: Secondary | ICD-10-CM

## 2015-10-12 NOTE — Patient Instructions (Signed)

## 2015-10-12 NOTE — Progress Notes (Signed)
[name] 18-Jan-1941 [id number]        74 y.o.  G2P2002  No LMP recorded. Patient has had a hysterectomy. for breast and pelvic exam  Past medical history,surgical history, problem list, medications, allergies, family history and social history were all reviewed and documented as reviewed in the EPIC chart.  ROS:  Performed with pertinent positives and negatives included in the history, assessment and plan.   Additional significant findings :  none   Exam: Kim Counsellor Vitals:   10/12/15 1037  BP: 124/74  Height: 5\' 4"  (1.626 m)  Weight: 143 lb (64.864 kg)   General appearance:  Normal affect, orientation and appearance. Skin: Grossly normal HEENT: Without gross lesions.  No cervical or supraclavicular adenopathy. Thyroid normal.  Lungs:  Clear without wheezing, rales or rhonchi Cardiac: RR, without RMG Abdominal:  Soft, nontender, without masses, guarding, rebound, organomegaly or hernia Breasts:  Examined lying and sitting without masses, retractions, discharge or axillary adenopathy. Pelvic:  Ext/BUS/vagina with generalized atrophic changes. Mild cystocele noted. Cuff well supported.  Adnexa  Without masses or tenderness    Anus and perineum  Normal   Rectovaginal  Normal sphincter tone without palpated masses or tenderness.    Assessment/Plan:  74 y.o. [id number] female for breast and pelvic exam.   1. Postmenopausal/atrophic genital changes. Status post TVH A&P repair in the past. Had been on estradiol patches but discontinued. Having some hot flashes but overall doing well. She is also on Coumadin for her TIAs I reviewed the issues of thrombosis with estrogen my recommendation to stay off of the patches and she agrees with this. 2. Mild cystocele. Stable on serial exams. 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 She has an appointment to see him and can follow up with him. 3. Pap smear 2011. No Pap smear done today. No history of significant  abnormal Pap smears. We both agreed to stop screening per current screening guidelines that she is over the age of 50 and status post hysterectomy for benign indications. 4. Colonoscopy 2014. Repeat at their recommended interval. 5. DEXA 2016 normal.  Repeat at 3-5 year interval. 6. Mammography scheduled and patient will follow up for this. SBE monthly reviewed. 7. Health maintenance. No routine lab work done as patient reports this done at her primary physician's office. Follow up 1 year, sooner as needed.   Anastasio Auerbach MD, 11:08 AM 10/12/2015

## 2015-10-14 DIAGNOSIS — M6281 Muscle weakness (generalized): Secondary | ICD-10-CM | POA: Diagnosis not present

## 2015-10-14 DIAGNOSIS — S335XXD Sprain of ligaments of lumbar spine, subsequent encounter: Secondary | ICD-10-CM | POA: Diagnosis not present

## 2015-10-15 ENCOUNTER — Ambulatory Visit
Admission: RE | Admit: 2015-10-15 | Discharge: 2015-10-15 | Disposition: A | Discharge status: Home / Self Care | Payer: Medicare Other | Source: Ambulatory Visit

## 2015-10-15 DIAGNOSIS — Z1231 Encounter for screening mammogram for malignant neoplasm of breast: Secondary | ICD-10-CM | POA: Diagnosis not present

## 2015-10-20 DIAGNOSIS — M6281 Muscle weakness (generalized): Secondary | ICD-10-CM | POA: Diagnosis not present

## 2015-10-20 DIAGNOSIS — S335XXD Sprain of ligaments of lumbar spine, subsequent encounter: Secondary | ICD-10-CM | POA: Diagnosis not present

## 2015-10-27 ENCOUNTER — Ambulatory Visit (INDEPENDENT_AMBULATORY_CARE_PROVIDER_SITE_OTHER): Payer: Medicare Other | Admitting: General Practice

## 2015-10-27 DIAGNOSIS — G459 Transient cerebral ischemic attack, unspecified: Secondary | ICD-10-CM | POA: Diagnosis not present

## 2015-10-27 DIAGNOSIS — Z8679 Personal history of other diseases of the circulatory system: Secondary | ICD-10-CM

## 2015-10-27 DIAGNOSIS — Z7901 Long term (current) use of anticoagulants: Secondary | ICD-10-CM | POA: Diagnosis not present

## 2015-10-27 LAB — POCT INR: INR: 1.6

## 2015-10-27 NOTE — Progress Notes (Signed)
Pre visit review using our clinic review tool, if applicable. No additional management support is needed unless otherwise documented below in the visit note. 

## 2015-10-27 NOTE — Progress Notes (Signed)
I have reviewed and agree with the plan. 

## 2015-11-02 DIAGNOSIS — S335XXD Sprain of ligaments of lumbar spine, subsequent encounter: Secondary | ICD-10-CM | POA: Diagnosis not present

## 2015-11-02 DIAGNOSIS — M6281 Muscle weakness (generalized): Secondary | ICD-10-CM | POA: Diagnosis not present

## 2015-11-09 ENCOUNTER — Encounter: Payer: Self-pay | Admitting: Adult Health

## 2015-11-09 ENCOUNTER — Ambulatory Visit (INDEPENDENT_AMBULATORY_CARE_PROVIDER_SITE_OTHER)
Admission: RE | Admit: 2015-11-09 | Discharge: 2015-11-09 | Disposition: A | Discharge status: Home / Self Care | Payer: Medicare Other | Source: Ambulatory Visit | Attending: Adult Health | Admitting: Adult Health

## 2015-11-09 ENCOUNTER — Telehealth: Payer: Self-pay | Admitting: Adult Health

## 2015-11-09 ENCOUNTER — Ambulatory Visit (INDEPENDENT_AMBULATORY_CARE_PROVIDER_SITE_OTHER): Payer: Medicare Other | Admitting: Adult Health

## 2015-11-09 VITALS — BP 124/72 | HR 83 | Temp 97.6°F | Ht 64.0 in | Wt 144.0 lb

## 2015-11-09 DIAGNOSIS — I639 Cerebral infarction, unspecified: Secondary | ICD-10-CM

## 2015-11-09 DIAGNOSIS — J441 Chronic obstructive pulmonary disease with (acute) exacerbation: Secondary | ICD-10-CM

## 2015-11-09 DIAGNOSIS — R05 Cough: Secondary | ICD-10-CM | POA: Diagnosis not present

## 2015-11-09 MED ORDER — AMOXICILLIN 500 MG PO CAPS: 500.0000 mg | ORAL_CAPSULE | Freq: Three times a day (TID) | ORAL | Status: DC

## 2015-11-09 NOTE — Progress Notes (Signed)
   Subjective:    Patient ID: [name], female    DOB: [birth year], 74 y.o.   MRN: [id number]  HPI  74 yo female with COPD  Previous pt of Dr. Joya Gaskins    TEST :  12/2011 with FEV1 62%, ratio 42  , FVC 104% , LDCO 69%,  S/p pulm rehab 2015    11/09/2015 Acute OV : COPD Pt presents for an acute office visit. Complains of non prod cough since 11/04/15, sinus pressure/drainage. Denies any SOB, wheezing, chest tightness/congestion, fever, nausea or vomiting. Cough is dry and barky.  Remains on Symbicort and Spiriva .  No chest pain, orthopnea, fever or discolored mucus Using mucinex and tessalon without much help  Had back surgery in July , doing well with recovery.     Review of Systems Constitutional:   No  weight loss, night sweats,  Fevers, chills, fatigue, or  lassitude.  HEENT:   No headaches,  Difficulty swallowing,  Tooth/dental problems, or  Sore throat,                No sneezing, itching, ear ache,  +nasal congestion, post nasal drip,   CV:  No chest pain,  Orthopnea, PND, swelling in lower extremities, anasarca, dizziness, palpitations, syncope.   GI  No heartburn, indigestion, abdominal pain, nausea, vomiting, diarrhea, change in bowel habits, loss of appetite, bloody stools.   Resp:  .  No chest wall deformity  Skin: no rash or lesions.  GU: no dysuria, change in color of urine, no urgency or frequency.  No flank pain, no hematuria   MS:  No joint pain or swelling.  No decreased range of motion.  No back pain.  Psych:  No change in mood or affect. No depression or anxiety.  No memory loss.         Objective:   Physical Exam GEN: A/Ox3; pleasant , NAD, elderly , barky cough   HEENT:  [place]/AT,  EACs-clear, TMs-wnl, NOSE-clear, THROAT-clear, no lesions, no postnasal drip or exudate noted.   NECK:  Supple w/ fair ROM; no JVD; normal carotid impulses w/o bruits; no thyromegaly or nodules palpated; no lymphadenopathy.  RESP  Decreased BS in bases , no  accessory muscle use, no dullness to percussion  CARD:  RRR, no m/r/g  , no peripheral edema, pulses intact, no cyanosis or clubbing.  GI:   Soft & nt; nml bowel sounds; no organomegaly or masses detected.  Musco: Warm bil, no deformities or joint swelling noted.  Arthritic changes of the hands   Neuro: alert, no focal deficits noted.    Skin: Warm, no lesions or rashes         Assessment & Plan:

## 2015-11-09 NOTE — Patient Instructions (Addendum)
Amoxicillin 500mg  Three times a day  For 7 days .  Chest xray today  Mucinex DM Twice daily  As needed  Cough/congestion  Take Zyrtec 10mg  At bedtime  As needed  Drainage.  Follow up Dr. Ashok Cordia in 1  months and As needed   Please contact office for sooner follow up if symptoms do not improve or worsen or seek emergency care

## 2015-11-09 NOTE — Assessment & Plan Note (Signed)
Flare with URI /early bronchitis  Check cxr   Plan Amoxicillin 500mg  Three times a day  For 7 days .  Chest xray today  Mucinex DM Twice daily  As needed  Cough/congestion  Take Zyrtec 10mg  At bedtime  As needed  Drainage.  Follow up Dr. Ashok Cordia in 1  months and As needed   Please contact office for sooner follow up if symptoms do not improve or worsen or seek emergency care

## 2015-11-09 NOTE — Progress Notes (Signed)
Quick Note:  LVM to return call ______

## 2015-11-10 ENCOUNTER — Ambulatory Visit (INDEPENDENT_AMBULATORY_CARE_PROVIDER_SITE_OTHER): Payer: Medicare Other | Admitting: General Practice

## 2015-11-10 DIAGNOSIS — G459 Transient cerebral ischemic attack, unspecified: Secondary | ICD-10-CM | POA: Diagnosis not present

## 2015-11-10 DIAGNOSIS — Z8679 Personal history of other diseases of the circulatory system: Secondary | ICD-10-CM | POA: Diagnosis not present

## 2015-11-10 DIAGNOSIS — Z7901 Long term (current) use of anticoagulants: Secondary | ICD-10-CM

## 2015-11-10 LAB — POCT INR: INR: 3.1

## 2015-11-10 NOTE — Telephone Encounter (Signed)
Pt returning call to get results.[name] ° °

## 2015-11-10 NOTE — Progress Notes (Signed)
Pre visit review using our clinic review tool, if applicable. No additional management support is needed unless otherwise documented below in the visit note. 

## 2015-11-10 NOTE — Progress Notes (Signed)
I have reviewed and agree with the plan. 

## 2015-11-10 NOTE — Telephone Encounter (Signed)
No sign of PNA    Copd changes     Cont w/ ov recs     Please contact office for sooner follow up if symptoms do not improve or worsen or seek emergency care    Spoke with pt, aware of results/recs.  Nothing further needed.

## 2015-11-10 NOTE — Telephone Encounter (Signed)
LMTCB x 1 

## 2015-11-12 ENCOUNTER — Other Ambulatory Visit (INDEPENDENT_AMBULATORY_CARE_PROVIDER_SITE_OTHER): Payer: Medicare Other

## 2015-11-12 ENCOUNTER — Encounter: Payer: Self-pay | Admitting: Internal Medicine

## 2015-11-12 ENCOUNTER — Ambulatory Visit (INDEPENDENT_AMBULATORY_CARE_PROVIDER_SITE_OTHER): Payer: Medicare Other | Admitting: Internal Medicine

## 2015-11-12 VITALS — BP 130/76 | HR 80 | Temp 98.0°F | Ht 64.0 in | Wt 142.0 lb

## 2015-11-12 DIAGNOSIS — R739 Hyperglycemia, unspecified: Secondary | ICD-10-CM | POA: Insufficient documentation

## 2015-11-12 DIAGNOSIS — I639 Cerebral infarction, unspecified: Secondary | ICD-10-CM

## 2015-11-12 DIAGNOSIS — I10 Essential (primary) hypertension: Secondary | ICD-10-CM | POA: Diagnosis not present

## 2015-11-12 DIAGNOSIS — E785 Hyperlipidemia, unspecified: Secondary | ICD-10-CM

## 2015-11-12 DIAGNOSIS — F329 Major depressive disorder, single episode, unspecified: Secondary | ICD-10-CM | POA: Diagnosis not present

## 2015-11-12 DIAGNOSIS — F32A Depression, unspecified: Secondary | ICD-10-CM

## 2015-11-12 LAB — LIPID PANEL
Cholesterol: 239 mg/dL — ABNORMAL HIGH (ref 0–200)
HDL: 49.7 mg/dL (ref >39.00)
LDL Cholesterol: 165 mg/dL — ABNORMAL HIGH (ref 0–99)
NonHDL: 189.05
Total CHOL/HDL Ratio: 5
Triglycerides: 122 mg/dL (ref 0.0–149.0)
VLDL: 24.4 mg/dL (ref 0.0–40.0)

## 2015-11-12 LAB — BASIC METABOLIC PANEL
BUN: 33 mg/dL — ABNORMAL HIGH (ref 6–23)
CO2: 26 mEq/L (ref 19–32)
Calcium: 10 mg/dL (ref 8.4–10.5)
Chloride: 102 mEq/L (ref 96–112)
Creatinine, Ser: 0.97 mg/dL (ref 0.40–1.20)
GFR: 59.3 mL/min — ABNORMAL LOW (ref >60.00)
Glucose, Bld: 86 mg/dL (ref 70–99)
Potassium: 4.7 mEq/L (ref 3.5–5.1)
Sodium: 138 mEq/L (ref 135–145)

## 2015-11-12 LAB — CBC WITH DIFFERENTIAL/PLATELET
Basophils Absolute: 0.1 10*3/uL (ref 0.0–0.1)
Basophils Relative: 0.8 % (ref 0.0–3.0)
Eosinophils Absolute: 0.2 10*3/uL (ref 0.0–0.7)
Eosinophils Relative: 3 % (ref 0.0–5.0)
HCT: 42.9 % (ref 36.0–46.0)
Hemoglobin: 14.1 g/dL (ref 12.0–15.0)
Lymphocytes Relative: 20.7 % (ref 12.0–46.0)
Lymphs Abs: 1.3 10*3/uL (ref 0.7–4.0)
MCHC: 32.8 g/dL (ref 30.0–36.0)
MCV: 82.5 fl (ref 78.0–100.0)
Monocytes Absolute: 0.8 10*3/uL (ref 0.1–1.0)
Monocytes Relative: 12.5 % — ABNORMAL HIGH (ref 3.0–12.0)
Neutro Abs: 4 10*3/uL (ref 1.4–7.7)
Neutrophils Relative %: 63 % (ref 43.0–77.0)
Platelets: 342 10*3/uL (ref 150.0–400.0)
RBC: 5.19 Mil/uL — ABNORMAL HIGH (ref 3.87–5.11)
RDW: 15.6 % — ABNORMAL HIGH (ref 11.5–15.5)
WBC: 6.4 10*3/uL (ref 4.0–10.5)

## 2015-11-12 LAB — HEPATIC FUNCTION PANEL
ALT: 14 U/L (ref 0–35)
AST: 17 U/L (ref 0–37)
Albumin: 4.1 g/dL (ref 3.5–5.2)
Alkaline Phosphatase: 95 U/L (ref 39–117)
Bilirubin, Direct: 0.1 mg/dL (ref 0.0–0.3)
Total Bilirubin: 0.6 mg/dL (ref 0.2–1.2)
Total Protein: 7.3 g/dL (ref 6.0–8.3)

## 2015-11-12 LAB — TSH: TSH: 1.43 u[IU]/mL (ref 0.35–4.50)

## 2015-11-12 LAB — HEMOGLOBIN A1C: Hgb A1c MFr Bld: 5.9 % (ref 4.6–6.5)

## 2015-11-12 MED ORDER — CITALOPRAM HYDROBROMIDE 20 MG PO TABS: 20.0000 mg | ORAL_TABLET | Freq: Every day | ORAL | Status: DC

## 2015-11-12 NOTE — Progress Notes (Signed)
Pre visit review using our clinic review tool, if applicable. No additional management support is needed unless otherwise documented below in the visit note. 

## 2015-11-12 NOTE — Progress Notes (Signed)
Subjective:    Patient ID: [name], female    DOB: 1941-03-22, 74 y.o.   MRN: [id number]  HPI  Here to f/u; overall doing ok,  Pt denies chest pain, increasing sob or doe, wheezing, orthopnea, PND, increased LE swelling, palpitations, dizziness or syncope.  Pt denies new neurological symptoms such as new headache, or facial or extremity weakness or numbness.  Pt denies polydipsia, polyuria, or low sugar episode.   Pt denies new neurological symptoms such as new headache, or facial or extremity weakness or numbness.   Pt states overall good compliance with meds, mostly trying to follow appropriate diet, with wt overall stable,  but little exercise however.  Primary caretaker fur husband with memory dysfxn,  service dog, and PTSD.  Asks for incresaed celexa due to stressors and worsening depression symptoms. Asks for coumadin refill but is normally done through coumadin clinic.   Recent cough and COPD exacerbation have improved. Past Medical History  Diagnosis Date  . SBE (subacute bacterial endocarditis) prophylaxis candidate   . COPD (chronic obstructive pulmonary disease) ([place])   . GERD (gastroesophageal reflux disease)   . HLD (hyperlipidemia)   . Osteoarthritis   . Hemorrhoids   . History of cerebrovascular accident   . TIA (transient ischemic attack) last 2001    anticoagulation therapy on coumadin  . HTN (hypertension)   . Stroke ([place])     STROKES  . DI (detrusor instability)   . Status post dilation of esophageal narrowing   . Depression 11/29/2013  . Nephrolithiasis     hx  . Pneumonia 2013  . Shingles since Nov 18, 2013    on right arm from shoulder to wrist  . Dysrhythmia   . Shortness of breath dyspnea   . Anxiety   . History of kidney stones   . Basal cell carcinoma    Past Surgical History  Procedure Laterality Date  . Bladder suspension    . Excision of basal cell ca  2011  . Thumb surgery Right 10-15 yrs ago  . Rectocele repair  2008    A&P REPAIR -DR.  MCDIARMID  . Anterior and posterior vaginal repair  2008    A&P REPAIR-DR MCDIARMID  . Colonoscopy    . Varicose vein surgery    . Upper gastrointestinal endoscopy    . Esophagogastroduodenoscopy N/A 03/03/2014    Procedure: ESOPHAGOGASTRODUODENOSCOPY (EGD);  Surgeon: Inda Castle, MD;  Location: [facility];  Service: Endoscopy;  Laterality: N/A;  . Balloon dilation N/A 03/03/2014    Procedure: BALLOON DILATION;  Surgeon: Inda Castle, MD;  Location: [facility];  Service: Endoscopy;  Laterality: N/A;  . Vaginal hysterectomy  1983    NO BSO-DR. MABRY  . Anterior lateral lumbar fusion 4 levels Right 07/03/2015    Procedure: Right Lumbar one-two, Lumbar two-three, Lumbar three-four, Lumbar four-five Anterior lateral lumbar interbody fusion;  Surgeon: Erline Levine, MD;  Location: [facility];  Service: Neurosurgery;  Laterality: Right;  Right L1-2 L2-3 L3-4 L4-5 Anterior lateral lumbar interbody fusion  . Lumbar percutaneous pedicle screw 4 level Right 07/03/2015    Procedure: Lumbar one-Sacral one Bilateral percutaneous pedicle screws;  Surgeon: Erline Levine, MD;  Location: [facility];  Service: Neurosurgery;  Laterality: Right;  L1-S1 Bilateral percutaneous pedicle screws    reports that she quit smoking about 19 years ago. Her smoking use included Cigarettes. She has a 60 pack-year smoking history. She has never used smokeless tobacco. She reports that she drinks alcohol.  She reports that she does not use illicit drugs. family history includes Breast cancer in her maternal aunt and mother; COPD in her mother; Emphysema in her mother; Uterine cancer in her mother. There is no history of Colon cancer, Esophageal cancer, Diabetes, Kidney disease, Liver disease, Heart disease, Rectal cancer, or Stomach cancer. Allergies  Allergen Reactions  . Clarithromycin     REACTION: possible rash but could have been legionarres dz with rash  . Lipitor [Atorvastatin]     MUSCLE SPASMS  . Simvastatin    . Crestor [Rosuvastatin Calcium]     fagitue and joint pain, "NO STATINS"   Current Outpatient Prescriptions on File Prior to Visit  Medication Sig Dispense Refill  . albuterol (PROVENTIL HFA;VENTOLIN HFA) 108 (90 BASE) MCG/ACT inhaler Inhale 1-2 puffs into the lungs every 6 (six) hours as needed for wheezing or shortness of breath. 3 Inhaler 4  . amLODipine (NORVASC) 5 MG tablet Take 1 tablet (5 mg total) by mouth every morning. 90 tablet 3  . amoxicillin (AMOXIL) 500 MG capsule Take 1 capsule (500 mg total) by mouth 3 (three) times daily. 21 capsule 0  . azelastine (ASTELIN) 137 MCG/SPRAY nasal spray Place 2 sprays into the nose daily. Use in each nostril as directed 90 mL 1  . budesonide-formoterol (SYMBICORT) 160-4.5 MCG/ACT inhaler Inhale 2 puffs into the lungs 2 (two) times daily. 3 Inhaler 4  . Calcium Carbonate-Vitamin D (CALCIUM + D PO) Take 1 tablet by mouth 2 (two) times daily.     . cyclobenzaprine (FLEXERIL) 5 MG tablet Take 1 tablet (5 mg total) by mouth 3 (three) times daily as needed for muscle spasms. 270 tablet 0  . irbesartan (AVAPRO) 150 MG tablet Take 1 tablet (150 mg total) by mouth every morning. 90 tablet 3  . lidocaine (XYLOCAINE JELLY) 2 % jelly Apply 1 application topically as needed. (Patient taking differently: Apply 1 application topically as needed. For shingles) 30 mL 1  . Magnesium 250 MG TABS Take 1 tablet by mouth daily as needed. Leg cramps    . Melatonin 1 MG TABS Take 3 mg by mouth at bedtime.     . meloxicam (MOBIC) 15 MG tablet Take 1 tablet (15 mg total) by mouth daily as needed for pain. 90 tablet 3  . mirabegron ER (MYRBETRIQ) 50 MG TB24 tablet Take 50 mg by mouth daily.    . montelukast (SINGULAIR) 10 MG tablet Take 1 tablet (10 mg total) by mouth daily. 90 tablet 3  . Multiple Vitamin (MULTIVITAMIN) tablet Take 1 tablet by mouth daily.      Marland Kitchen omeprazole (PRILOSEC) 20 MG capsule Take 1 capsule (20 mg total) by mouth 2 (two) times daily before a meal.  (Patient taking differently: Take 20 mg by mouth daily. ) 180 capsule 3  . Respiratory Therapy Supplies (FLUTTER) DEVI Use 2-3 times per day 1 each 0  . Tiotropium Bromide Monohydrate (SPIRIVA RESPIMAT) 2.5 MCG/ACT AERS Inhale 2 puffs into the lungs daily. 3 Inhaler 4  . Vitamin Mixture (VITAMIN E COMPLETE PO) Take 1 capsule by mouth daily. 400iu    . warfarin (COUMADIN) 5 MG tablet Take as directed by anticoagulation clinic (Patient taking differently: Take 2.5-5 mg by mouth See admin instructions. Take as directed by anticoagulation clinic) 180 tablet 1  . estradiol (CLIMARA - DOSED IN MG/24 HR) 0.0375 mg/24hr patch Place 1 patch (0.0375 mg total) onto the skin once a week. (Patient not taking: Reported on 11/09/2015) 12 patch 4  . [  DISCONTINUED] valsartan (DIOVAN) 160 MG tablet Take 1 tablet (160 mg total) by mouth daily. 90 tablet 3   No current facility-administered medications on file prior to visit.    Review of Systems  Constitutional: Negative for unusual diaphoresis or night sweats HENT: Negative for ringing in ear or discharge Eyes: Negative for double vision or worsening visual disturbance.  Respiratory: Negative for choking and stridor.   Gastrointestinal: Negative for vomiting or other signifcant bowel change Genitourinary: Negative for hematuria or change in urine volume.  Musculoskeletal: Negative for other MSK pain or swelling Skin: Negative for color change and worsening wound.  Neurological: Negative for tremors and numbness other than noted  Psychiatric/Behavioral: Negative for decreased concentration or agitation other than above       Objective:   Physical Exam BP 130/76 mmHg  Pulse 80  Temp(Src) 98 F (36.7 C) (Oral)  Ht 5\' 4"  (1.626 m)  Wt 142 lb (64.411 kg)  BMI 24.36 kg/m2  SpO2 97% VS noted,  Constitutional: Pt appears in no significant distress HENT: Head: NCAT.  Right Ear: External ear normal.  Left Ear: External ear normal.  Eyes: . Pupils are  equal, round, and reactive to light. Conjunctivae and EOM are normal Neck: Normal range of motion. Neck supple.  Cardiovascular: Normal rate and regular rhythm.   Pulmonary/Chest: Effort normal and breath sounds decreased without rales or wheezing.  Neurological: Pt is alert. Not confused , motor grossly intact Skin: Skin is warm. No rash, no LE edema Psychiatric: Pt behavior is normal. No agitation. mild dysphoric appearing    Assessment & Plan:

## 2015-11-12 NOTE — Patient Instructions (Addendum)
You had the tetanus (Td) shot today  OK to incresae the celexa to 20 mg per day  Please remember to ask for coumadin refill with your coumadin clinic  Please continue all other medications as before, and refills have been done if requested.  Please have the pharmacy call with any other refills you may need.  Please continue your efforts at being more active, low cholesterol diet, and weight control  Please keep your appointments with your specialists as you may have planned  Please go to the LAB in the Basement (turn left off the elevator) for the tests to be done today  You will be contacted by phone if any changes need to be made immediately.  Otherwise, you will receive a letter about your results with an explanation, but please check with MyChart first.  Please remember to sign up for MyChart if you have not done so, as this will be important to you in the future with finding out test results, communicating by private email, and scheduling acute appointments online when needed.  Please return in 6 months, or sooner if needed

## 2015-11-15 NOTE — Assessment & Plan Note (Signed)
stable overall by history and exam, recent data reviewed with pt, and pt to continue medical treatment as before,  to f/u any worsening symptoms or concerns BP Readings from Last 3 Encounters:  11/12/15 130/76  11/09/15 124/72  10/12/15 124/74

## 2015-11-15 NOTE — Assessment & Plan Note (Signed)
stable overall by history and exam, recent data reviewed with pt, and pt to continue medical treatment as before,  to f/u any worsening symptoms or concerns Lab Results  Component Value Date   LDLCALC 165* 11/12/2015   D/w pt, for lower chol diet, has been statin intolerant, declines further trial

## 2015-11-15 NOTE — Assessment & Plan Note (Signed)
[place] for increased celexa to 20 qd,  to f/u any worsening symptoms or concerns

## 2015-11-15 NOTE — Assessment & Plan Note (Signed)
stable overall by history and exam, recent data reviewed with pt, and pt to continue medical treatment as before,  to f/u any worsening symptoms or concerns Lab Results  Component Value Date   HGBA1C 5.9 11/12/2015

## 2015-11-16 DIAGNOSIS — M4806 Spinal stenosis, lumbar region: Secondary | ICD-10-CM | POA: Diagnosis not present

## 2015-11-16 DIAGNOSIS — M7061 Trochanteric bursitis, right hip: Secondary | ICD-10-CM | POA: Diagnosis not present

## 2015-11-16 DIAGNOSIS — R351 Nocturia: Secondary | ICD-10-CM | POA: Diagnosis not present

## 2015-11-16 DIAGNOSIS — M4120 Other idiopathic scoliosis, site unspecified: Secondary | ICD-10-CM | POA: Diagnosis not present

## 2015-11-23 NOTE — Telephone Encounter (Signed)
Erroneous

## 2015-12-03 DIAGNOSIS — M25551 Pain in right hip: Secondary | ICD-10-CM | POA: Diagnosis not present

## 2015-12-08 ENCOUNTER — Ambulatory Visit (INDEPENDENT_AMBULATORY_CARE_PROVIDER_SITE_OTHER): Payer: Medicare Other | Admitting: General Practice

## 2015-12-08 DIAGNOSIS — I639 Cerebral infarction, unspecified: Secondary | ICD-10-CM

## 2015-12-08 DIAGNOSIS — Z5181 Encounter for therapeutic drug level monitoring: Secondary | ICD-10-CM | POA: Diagnosis not present

## 2015-12-08 DIAGNOSIS — G459 Transient cerebral ischemic attack, unspecified: Secondary | ICD-10-CM | POA: Diagnosis not present

## 2015-12-08 LAB — POCT INR: INR: 1.8

## 2015-12-08 NOTE — Progress Notes (Signed)
I have reviewed and agree with the plan. 

## 2015-12-08 NOTE — Progress Notes (Signed)
Pre visit review using our clinic review tool, if applicable. No additional management support is needed unless otherwise documented below in the visit note. 

## 2015-12-14 DIAGNOSIS — M19041 Primary osteoarthritis, right hand: Secondary | ICD-10-CM | POA: Diagnosis not present

## 2015-12-16 DIAGNOSIS — M25551 Pain in right hip: Secondary | ICD-10-CM | POA: Diagnosis not present

## 2015-12-21 ENCOUNTER — Telehealth: Payer: Self-pay | Admitting: Pulmonary Disease

## 2015-12-21 DIAGNOSIS — M25551 Pain in right hip: Secondary | ICD-10-CM | POA: Diagnosis not present

## 2015-12-21 NOTE — Telephone Encounter (Signed)
PFT 12/28/11:  FVC 2.89 L (104%) FEV1 1.22 L (62%) FEV1/FVC 0.42 FEF 25-75 0.33 L (15%) no bronchodilator response TLC 6.19 L (129%) RV 163% ERV 114% DLCO uncorrected 69%  IMAGING CXR PA/LAT 11/09/15 (personally reviewed by me):  No focal opacity or effusion appreciated. Heart normal in size. Mediastinum normal in contour. Mild hyperinflation with flattening of the diaphragms.

## 2015-12-22 ENCOUNTER — Ambulatory Visit: Payer: Medicare Other | Admitting: Pulmonary Disease

## 2015-12-22 ENCOUNTER — Ambulatory Visit (INDEPENDENT_AMBULATORY_CARE_PROVIDER_SITE_OTHER): Payer: Medicare Other | Admitting: Pulmonary Disease

## 2015-12-22 ENCOUNTER — Encounter: Payer: Self-pay | Admitting: Pulmonary Disease

## 2015-12-22 ENCOUNTER — Other Ambulatory Visit: Payer: Medicare Other

## 2015-12-22 VITALS — BP 134/72 | HR 60 | Ht 64.0 in | Wt 143.6 lb

## 2015-12-22 DIAGNOSIS — R911 Solitary pulmonary nodule: Secondary | ICD-10-CM

## 2015-12-22 DIAGNOSIS — K219 Gastro-esophageal reflux disease without esophagitis: Secondary | ICD-10-CM | POA: Diagnosis not present

## 2015-12-22 DIAGNOSIS — J449 Chronic obstructive pulmonary disease, unspecified: Secondary | ICD-10-CM | POA: Diagnosis not present

## 2015-12-22 DIAGNOSIS — J3089 Other allergic rhinitis: Secondary | ICD-10-CM | POA: Diagnosis not present

## 2015-12-22 MED ORDER — AEROCHAMBER Z-STAT PLUS MISC: Status: DC

## 2015-12-22 MED ORDER — FLUTICASONE PROPIONATE 50 MCG/ACT NA SUSP: 1.0000 | Freq: Two times a day (BID) | NASAL | Status: DC

## 2015-12-22 NOTE — Patient Instructions (Signed)
1. Continue using your Symbicort & Spiriva as prescribed. 2. I want to to start using a spacer with your Symbicort. 3. Continue using your Astelin nasal spray & Singulair as prescribed. 4. I want you to get a saline rinse over-the-counter and rinse your sinuses as directed on the box twice daily. I recommend NeilMed. Use this before you use your Astelin and Flonase sprays. 5. I'm also prescribing you Flonase to use twice daily. Remember to spray it away from the bridge of your nose. 6. We will repeat breathing tests at your next appointment. 7. I will see you back in 2-3 months or sooner if needed.  TESTS ORDERED: 1. Alpha-1 Antitrypsin Phenotype today 2. Full PFT at follow-up appointment

## 2015-12-22 NOTE — Progress Notes (Signed)
Subjective:    Patient ID: [name], female    DOB: 1941-03-25, 75 y.o.   MRN: [id number]  C.C.:  Follow-up for Moderate COPD, Allergic Rhinitis, Right Lung Nodule, & GERD.  HPI Moderate COPD:  Previously completed pulmonary rehab in [place]. Compliant with Symbicort & Spiriva. Uses her rescue inhaler once daily in the morning. She reports she has infrequent coughing. She is using Mucinex to help with her mucus. She produces a "stringy" mucus that is clear to yellow to green at times. She feels like she has more congestion. She hasn't been able to exercise recently due to hip and back pain. Previously was doing elliptical & treadmill.   Allergic Rhinitis: Compliant with Astelin nasal spray & Singulair. Not currently using nasal saline rinse. Denies any sinus congestion or pressure.   Right Lung Nodule:  First noted on CT in right lower lobe 08/01/13 measuring 87mm. No sign of mass on recent CXR on my review.   GERD:  She reports her reflux has improved on her low carbohydrate diet. She takes Prilosec once daily now in the morning. No morning brash water. No dyspepsia. H/O Esophageal stricture & dilation. She does have dysphagia at times with medications. She is following with Dr. Henrene Pastor.   Review of Systems She denies any fever, chills, or sweats. Denies any chest pain or pressure.   Allergies  Allergen Reactions  . Clarithromycin     REACTION: possible rash but could have been legionarres dz with rash  . Lipitor [Atorvastatin]     MUSCLE SPASMS  . Simvastatin   . Crestor [Rosuvastatin Calcium]     fagitue and joint pain, "NO STATINS"    Current Outpatient Prescriptions on File Prior to Visit  Medication Sig Dispense Refill  . albuterol (PROVENTIL HFA;VENTOLIN HFA) 108 (90 BASE) MCG/ACT inhaler Inhale 1-2 puffs into the lungs every 6 (six) hours as needed for wheezing or shortness of breath. 3 Inhaler 4  . amLODipine (NORVASC) 5 MG tablet Take 1 tablet (5 mg total) by mouth every  morning. 90 tablet 3  . azelastine (ASTELIN) 137 MCG/SPRAY nasal spray Place 2 sprays into the nose daily. Use in each nostril as directed 90 mL 1  . budesonide-formoterol (SYMBICORT) 160-4.5 MCG/ACT inhaler Inhale 2 puffs into the lungs 2 (two) times daily. 3 Inhaler 4  . Calcium Carbonate-Vitamin D (CALCIUM + D PO) Take 1 tablet by mouth 2 (two) times daily.     . citalopram (CELEXA) 20 MG tablet Take 1 tablet (20 mg total) by mouth daily. 90 tablet 3  . cyclobenzaprine (FLEXERIL) 5 MG tablet Take 1 tablet (5 mg total) by mouth 3 (three) times daily as needed for muscle spasms. 270 tablet 0  . irbesartan (AVAPRO) 150 MG tablet Take 1 tablet (150 mg total) by mouth every morning. 90 tablet 3  . lidocaine (XYLOCAINE JELLY) 2 % jelly Apply 1 application topically as needed. (Patient taking differently: Apply 1 application topically as needed. For shingles) 30 mL 1  . Magnesium 250 MG TABS Take 1 tablet by mouth daily as needed. Leg cramps    . Melatonin 1 MG TABS Take 3 mg by mouth at bedtime.     . meloxicam (MOBIC) 15 MG tablet Take 1 tablet (15 mg total) by mouth daily as needed for pain. (Patient taking differently: Take 15 mg by mouth daily. ) 90 tablet 3  . mirabegron ER (MYRBETRIQ) 50 MG TB24 tablet Take 50 mg by mouth daily.    Marland Kitchen  montelukast (SINGULAIR) 10 MG tablet Take 1 tablet (10 mg total) by mouth daily. 90 tablet 3  . Multiple Vitamin (MULTIVITAMIN) tablet Take 1 tablet by mouth daily.      Marland Kitchen omeprazole (PRILOSEC) 20 MG capsule Take 1 capsule (20 mg total) by mouth 2 (two) times daily before a meal. (Patient taking differently: Take 20 mg by mouth daily. ) 180 capsule 3  . Respiratory Therapy Supplies (FLUTTER) DEVI Use 2-3 times per day 1 each 0  . Tiotropium Bromide Monohydrate (SPIRIVA RESPIMAT) 2.5 MCG/ACT AERS Inhale 2 puffs into the lungs daily. 3 Inhaler 4  . Vitamin Mixture (VITAMIN E COMPLETE PO) Take 1 capsule by mouth daily. 400iu    . warfarin (COUMADIN) 5 MG tablet Take as  directed by anticoagulation clinic (Patient taking differently: Take 2.5-5 mg by mouth See admin instructions. Take as directed by anticoagulation clinic) 180 tablet 1  . [DISCONTINUED] valsartan (DIOVAN) 160 MG tablet Take 1 tablet (160 mg total) by mouth daily. 90 tablet 3   No current facility-administered medications on file prior to visit.    Past Medical History  Diagnosis Date  . SBE (subacute bacterial endocarditis) prophylaxis candidate   . COPD (chronic obstructive pulmonary disease) ([place])   . GERD (gastroesophageal reflux disease)   . HLD (hyperlipidemia)   . Osteoarthritis   . Hemorrhoids   . History of cerebrovascular accident   . TIA (transient ischemic attack) last 2001    anticoagulation therapy on coumadin  . HTN (hypertension)   . Stroke ([place])     STROKES  . DI (detrusor instability)   . Status post dilation of esophageal narrowing   . Depression 11/29/2013  . Nephrolithiasis     hx  . Pneumonia 2013  . Shingles since Nov 18, 2013    on right arm from shoulder to wrist  . Dysrhythmia   . Shortness of breath dyspnea   . Anxiety   . History of kidney stones   . Basal cell carcinoma   . Allergic rhinitis     Past Surgical History  Procedure Laterality Date  . Bladder suspension    . Excision of basal cell ca  2011  . Thumb surgery Right 10-15 yrs ago  . Rectocele repair  2008    A&P REPAIR -DR. MCDIARMID  . Anterior and posterior vaginal repair  2008    A&P REPAIR-DR MCDIARMID  . Colonoscopy    . Varicose vein surgery    . Upper gastrointestinal endoscopy    . Esophagogastroduodenoscopy N/A 03/03/2014    Procedure: ESOPHAGOGASTRODUODENOSCOPY (EGD);  Surgeon: Inda Castle, MD;  Location: [facility];  Service: Endoscopy;  Laterality: N/A;  . Balloon dilation N/A 03/03/2014    Procedure: BALLOON DILATION;  Surgeon: Inda Castle, MD;  Location: [facility];  Service: Endoscopy;  Laterality: N/A;  . Vaginal hysterectomy  1983    NO BSO-DR. MABRY    . Anterior lateral lumbar fusion 4 levels Right 07/03/2015    Procedure: Right Lumbar one-two, Lumbar two-three, Lumbar three-four, Lumbar four-five Anterior lateral lumbar interbody fusion;  Surgeon: Erline Levine, MD;  Location: [facility];  Service: Neurosurgery;  Laterality: Right;  Right L1-2 L2-3 L3-4 L4-5 Anterior lateral lumbar interbody fusion  . Lumbar percutaneous pedicle screw 4 level Right 07/03/2015    Procedure: Lumbar one-Sacral one Bilateral percutaneous pedicle screws;  Surgeon: Erline Levine, MD;  Location: [facility];  Service: Neurosurgery;  Laterality: Right;  L1-S1 Bilateral percutaneous pedicle screws    Family  History  Problem Relation Age of Onset  . COPD Mother   . Breast cancer Mother     mid 72's  . Uterine cancer Mother   . Emphysema Mother   . Breast cancer Maternal Aunt     x 3 aunts, post menopausal  . Colon cancer Neg Hx   . Esophageal cancer Neg Hx   . Diabetes Neg Hx   . Kidney disease Neg Hx   . Liver disease Neg Hx   . Heart disease Neg Hx   . Rectal cancer Neg Hx   . Stomach cancer Neg Hx     Social History   Social History  . Marital Status: Married    Spouse Name: N/A  . Number of Children: 2  . Years of Education: N/A   Occupational History  . retired    Social History Main Topics  . Smoking status: Former Smoker -- 3.00 packs/day for 42 years    Types: Cigarettes    Start date: 08/04/1954    Quit date: 12/06/1995  . Smokeless tobacco: Never Used  . Alcohol Use: 0.0 oz/week    0 Standard drinks or equivalent per week     Comment: occas very little  . Drug Use: No  . Sexual Activity: Not Currently    Birth Control/ Protection: Surgical     Comment: HYST-1st intercourse 23 yo-2 partners   Other Topics Concern  . None   Social History Narrative   Retired- Dentist Pulmonary:   Originally from [place]. Has always lived in [place]. She has traveled to [place], [place], [place], [place],  [place], [place]. Has been on multiple cruises. 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 She did use a face mask consistently to avoid chemical fume exposure. She may have only had an inhaled exposure a couple of times. She currently has a dog. Remote exposure to a parakeet. No mold or hot tub exposure. No asbestos exposure.       Objective:   Physical Exam BP 134/72 mmHg  Pulse 60  Ht 5\' 4"  (1.626 m)  Wt 143 lb 9.6 oz (65.137 kg)  BMI 24.64 kg/m2  SpO2 96% General:  Awake. Alert. No acute distress. Accompanied by husband today.  Integument:  Warm & dry. No rash on exposed skin. No bruising. HEENT:  Moist mucus membranes. No oral ulcers. No scleral injection or icterus. Moderate to severe bilateral nasal turbinate swelling. No sinus tenderness to palpation. Cardiovascular:  Regular rate. No edema. No appreciable JVD.  Pulmonary:  Good aeration & clear to auscultation bilaterally. Symmetric chest wall expansion. No accessory muscle use on room air. We quality of voice. Abdomen: Soft. Normal bowel sounds. Nondistended. Grossly nontender. Musculoskeletal:  Normal bulk and tone. No joint deformity or effusion appreciated.  PFT 12/28/11: FVC 2.89 L (104%) FEV1 1.22 L (62%) FEV1/FVC 0.42 FEF 25-75 0.33 L (15%) no bronchodilator response TLC 6.19 L (129%) RV 163% ERV 114% DLCO uncorrected 69%  IMAGING CXR PA/LAT 11/09/15 (personally reviewed by me): No focal opacity or effusion appreciated. Heart normal in size. Mediastinum normal in contour. Mild hyperinflation with flattening of the diaphragms.     Assessment & Plan:  75 year old female with underlying moderate COPD and ongoing cough with sputum production. I feel this is more related to her allergic rhinitis given no wheezing on physical exam today. As such, I'm continuing the patient on her current  inhaler regimen. I personally reviewed her prior chest x-ray imaging which  shows no developing nodule or opacity in the right lower lobe despite previously noted right lower lobe nodule on CT imaging from 2014. Patient's reflux seems to be well-controlled at this time despite ongoing issues with dysphasia. Patient does have follow-up with Dr. Henrene Pastor of GI who can further address this issue. I do feel it's reasonable to transition the patient off of proton pump inhibitor therapy given potential for negative consequences with long-term therapy. I instructed the patient to contact my office if she had any new breathing problems before her next appointment as we would be happy to see her sooner.  1. Moderate COPD: Screening for alpha-1 antitrypsin deficiency. Continuing Symbicort and Spiriva. Starting use of spacer with Symbicort to improve drug delivery. Repeat full pulmonary function testing at next appointment. 2. Allergic rhinitis: Continuing patient on Singulair and Astelin nasal spray. Starting the patient on nasal saline rinse twice daily along with Flonase nasal spray twice daily. Instructed patient on proper technique. 3. Right lower lobe nodule: No evidence for developing nodule or mass on recent chest x-ray imaging. No plan for further chest imaging at this time. 4. GERD: Currently asymptomatic on Prilosec once daily. Plan to transition to Zantac or Pepcid daily at bedtime/daily at next appointment. 5. Dysphagia: History of esophageal stricture and dilation. Patient has follow-up with Dr. Henrene Pastor of GI. 6. Follow-up: Patient to return to clinic in 2-3 months or sooner if needed.

## 2015-12-26 DIAGNOSIS — S39012A Strain of muscle, fascia and tendon of lower back, initial encounter: Secondary | ICD-10-CM | POA: Diagnosis not present

## 2015-12-26 DIAGNOSIS — M25551 Pain in right hip: Secondary | ICD-10-CM | POA: Diagnosis not present

## 2015-12-26 DIAGNOSIS — S76011A Strain of muscle, fascia and tendon of right hip, initial encounter: Secondary | ICD-10-CM | POA: Diagnosis not present

## 2015-12-26 LAB — ALPHA-1 ANTITRYPSIN PHENOTYPE: A-1 Antitrypsin: 143 mg/dL (ref 83–199)

## 2016-01-06 ENCOUNTER — Ambulatory Visit (INDEPENDENT_AMBULATORY_CARE_PROVIDER_SITE_OTHER): Payer: Medicare Other | Admitting: General Practice

## 2016-01-06 DIAGNOSIS — Z7901 Long term (current) use of anticoagulants: Secondary | ICD-10-CM | POA: Diagnosis not present

## 2016-01-06 DIAGNOSIS — G459 Transient cerebral ischemic attack, unspecified: Secondary | ICD-10-CM

## 2016-01-06 LAB — POCT INR: INR: 1.8

## 2016-01-06 NOTE — Progress Notes (Signed)
I have reviewed and agree with the plan. 

## 2016-01-06 NOTE — Progress Notes (Signed)
Pre visit review using our clinic review tool, if applicable. No additional management support is needed unless otherwise documented below in the visit note. 

## 2016-01-27 ENCOUNTER — Ambulatory Visit: Payer: Medicare Other | Admitting: Internal Medicine

## 2016-02-03 ENCOUNTER — Other Ambulatory Visit: Payer: Self-pay | Admitting: General Practice

## 2016-02-03 ENCOUNTER — Ambulatory Visit (INDEPENDENT_AMBULATORY_CARE_PROVIDER_SITE_OTHER): Payer: Medicare Other | Admitting: General Practice

## 2016-02-03 DIAGNOSIS — G459 Transient cerebral ischemic attack, unspecified: Secondary | ICD-10-CM

## 2016-02-03 DIAGNOSIS — Z7901 Long term (current) use of anticoagulants: Secondary | ICD-10-CM | POA: Diagnosis not present

## 2016-02-03 LAB — POCT INR: INR: 1.8

## 2016-02-03 MED ORDER — WARFARIN SODIUM 5 MG PO TABS: ORAL_TABLET | ORAL | Status: DC

## 2016-02-03 NOTE — Progress Notes (Signed)
Pre visit review using our clinic review tool, if applicable. No additional management support is needed unless otherwise documented below in the visit note. 

## 2016-02-03 NOTE — Progress Notes (Signed)
I have reviewed and agree with the plan. 

## 2016-02-10 DIAGNOSIS — M25551 Pain in right hip: Secondary | ICD-10-CM | POA: Diagnosis not present

## 2016-02-19 ENCOUNTER — Encounter: Payer: Self-pay | Admitting: Pulmonary Disease

## 2016-02-19 MED ORDER — TIOTROPIUM BROMIDE MONOHYDRATE 2.5 MCG/ACT IN AERS: 2.0000 | INHALATION_SPRAY | Freq: Every day | RESPIRATORY_TRACT | Status: DC

## 2016-02-20 ENCOUNTER — Encounter: Payer: Self-pay | Admitting: Internal Medicine

## 2016-02-21 DIAGNOSIS — J209 Acute bronchitis, unspecified: Secondary | ICD-10-CM | POA: Diagnosis not present

## 2016-02-21 DIAGNOSIS — J019 Acute sinusitis, unspecified: Secondary | ICD-10-CM | POA: Diagnosis not present

## 2016-02-23 ENCOUNTER — Telehealth: Payer: Self-pay

## 2016-02-23 ENCOUNTER — Encounter: Payer: Self-pay | Admitting: Internal Medicine

## 2016-02-23 MED ORDER — MONTELUKAST SODIUM 10 MG PO TABS: 10.0000 mg | ORAL_TABLET | Freq: Every day | ORAL | Status: DC

## 2016-02-23 MED ORDER — MELOXICAM 15 MG PO TABS: 15.0000 mg | ORAL_TABLET | Freq: Every day | ORAL | Status: DC

## 2016-02-23 MED ORDER — AMLODIPINE BESYLATE 5 MG PO TABS: 5.0000 mg | ORAL_TABLET | Freq: Every morning | ORAL | Status: DC

## 2016-02-23 NOTE — Telephone Encounter (Signed)
Medications sent to pharmacy

## 2016-03-01 ENCOUNTER — Ambulatory Visit (INDEPENDENT_AMBULATORY_CARE_PROVIDER_SITE_OTHER): Payer: Medicare Other | Admitting: Pulmonary Disease

## 2016-03-01 ENCOUNTER — Encounter: Payer: Self-pay | Admitting: Pulmonary Disease

## 2016-03-01 VITALS — BP 112/62 | HR 80 | Ht 64.25 in | Wt 143.0 lb

## 2016-03-01 DIAGNOSIS — B370 Candidal stomatitis: Secondary | ICD-10-CM | POA: Diagnosis not present

## 2016-03-01 DIAGNOSIS — J3089 Other allergic rhinitis: Secondary | ICD-10-CM | POA: Diagnosis not present

## 2016-03-01 DIAGNOSIS — J441 Chronic obstructive pulmonary disease with (acute) exacerbation: Secondary | ICD-10-CM

## 2016-03-01 DIAGNOSIS — J449 Chronic obstructive pulmonary disease, unspecified: Secondary | ICD-10-CM | POA: Diagnosis not present

## 2016-03-01 DIAGNOSIS — K219 Gastro-esophageal reflux disease without esophagitis: Secondary | ICD-10-CM | POA: Diagnosis not present

## 2016-03-01 LAB — PULMONARY FUNCTION TEST
DL/VA % pred: 71 %
DL/VA: 3.43 ml/min/mmHg/L
DLCO cor % pred: 62 %
DLCO cor: 15.47 ml/min/mmHg
DLCO unc % pred: 65 %
DLCO unc: 16.22 ml/min/mmHg
FEF 25-75 Post: 0.77 L/sec
FEF 25-75 Pre: 0.56 L/sec
FEF2575-%Change-Post: 37 %
FEF2575-%Pred-Post: 47 %
FEF2575-%Pred-Pre: 34 %
FEV1-%Change-Post: 10 %
FEV1-%Pred-Post: 69 %
FEV1-%Pred-Pre: 62 %
FEV1-Post: 1.44 L
FEV1-Pre: 1.3 L
FEV1FVC-%Change-Post: 3 %
FEV1FVC-%Pred-Pre: 68 %
FEV6-%Change-Post: 6 %
FEV6-%Pred-Post: 98 %
FEV6-%Pred-Pre: 92 %
FEV6-Post: 2.59 L
FEV6-Pre: 2.44 L
FEV6FVC-%Change-Post: 0 %
FEV6FVC-%Pred-Post: 100 %
FEV6FVC-%Pred-Pre: 100 %
FVC-%Change-Post: 6 %
FVC-%Pred-Post: 97 %
FVC-%Pred-Pre: 91 %
FVC-Post: 2.72 L
FVC-Pre: 2.55 L
Post FEV1/FVC ratio: 53 %
Post FEV6/FVC ratio: 95 %
Pre FEV1/FVC ratio: 51 %
Pre FEV6/FVC Ratio: 96 %
RV % pred: 116 %
RV: 2.71 L
TLC % pred: 111 %
TLC: 5.66 L

## 2016-03-01 MED ORDER — NYSTATIN 100000 UNIT/ML MT SUSP: OROMUCOSAL | Status: DC

## 2016-03-01 MED ORDER — AZELASTINE HCL 0.1 % NA SOLN: NASAL | Status: DC

## 2016-03-01 MED ORDER — BUDESONIDE-FORMOTEROL FUMARATE 160-4.5 MCG/ACT IN AERO: 2.0000 | INHALATION_SPRAY | Freq: Two times a day (BID) | RESPIRATORY_TRACT | Status: DC

## 2016-03-01 MED ORDER — RANITIDINE HCL 150 MG PO TABS: 150.0000 mg | ORAL_TABLET | Freq: Every day | ORAL | Status: DC

## 2016-03-01 NOTE — Progress Notes (Signed)
PFT done today. 

## 2016-03-01 NOTE — Patient Instructions (Addendum)
   Continue taking Symbicort & Spiriva as prescribed.  Take your Prilosec with Zantac daily for 2 weeks before you start to wean off the Prilosec. Then take Prilosec every other day for a week then try stopping the Prilosec.   I will see you back in 3 months with a breathing test at that time.   TESTS ORDERED: 1. Spirometry with bronchodilator challenge at next appointment

## 2016-03-01 NOTE — Progress Notes (Signed)
Subjective:    Patient ID: [name], female    DOB: Mar 21, 1941, 75 y.o.   MRN: [id number]  C.C.:  Follow-up for Moderate COPD, Allergic Rhinitis, & GERD.  HPI Moderate COPD:  Started on with Symbicort at last appointment. Continued on Spiriva. She reports 2 weeks ago she was seen in Urgent Care and started on an antibiotic & cough syrup. Recently she had exposure to dust with some tile removal in her home. She did get an IM injection of Kenalog. Reports compliance with her Spiriva and Symbicort. No nocturnal awakenings with coughing. Rare need for her rescue inhaler. Cough remains nonproductive.  Allergic Rhinitis: Patient instructed to use nasal saline rinse twice daily last appointment. She reports recent increase in sinus congestion as well.   GERD:  She is following with Dr. Henrene Pastor. Continuing on Prilosec. No reflux or dyspepsia. No morning brash water taste.   Review of Systems Denies any fever or chills. No chest pain or tightness.   Allergies  Allergen Reactions  . Clarithromycin     REACTION: possible rash but could have been legionarres dz with rash  . Lipitor [Atorvastatin]     MUSCLE SPASMS  . Simvastatin   . Crestor [Rosuvastatin Calcium]     fagitue and joint pain, "NO STATINS"    Current Outpatient Prescriptions on File Prior to Visit  Medication Sig Dispense Refill  . albuterol (PROVENTIL HFA;VENTOLIN HFA) 108 (90 BASE) MCG/ACT inhaler Inhale 1-2 puffs into the lungs every 6 (six) hours as needed for wheezing or shortness of breath. 3 Inhaler 4  . amLODipine (NORVASC) 5 MG tablet Take 1 tablet (5 mg total) by mouth every morning. 90 tablet 3  . azelastine (ASTELIN) 137 MCG/SPRAY nasal spray Place 2 sprays into the nose daily. Use in each nostril as directed 90 mL 1  . benzonatate (TESSALON) 100 MG capsule Take 100 mg by mouth 3 (three) times daily as needed for cough.    . budesonide-formoterol (SYMBICORT) 160-4.5 MCG/ACT inhaler Inhale 2 puffs into the lungs 2  (two) times daily. 3 Inhaler 4  . Calcium Carbonate-Vitamin D (CALCIUM + D PO) Take 1 tablet by mouth 2 (two) times daily.     . citalopram (CELEXA) 20 MG tablet Take 1 tablet (20 mg total) by mouth daily. 90 tablet 3  . cyclobenzaprine (FLEXERIL) 5 MG tablet Take 1 tablet (5 mg total) by mouth 3 (three) times daily as needed for muscle spasms. 270 tablet 0  . fluticasone (FLONASE) 50 MCG/ACT nasal spray Place 1 spray into both nostrils 2 (two) times daily. 48 g 1  . irbesartan (AVAPRO) 150 MG tablet Take 1 tablet (150 mg total) by mouth every morning. 90 tablet 3  . lidocaine (XYLOCAINE JELLY) 2 % jelly Apply 1 application topically as needed. (Patient taking differently: Apply 1 application topically as needed. For shingles) 30 mL 1  . Magnesium 250 MG TABS Take 1 tablet by mouth daily as needed. Leg cramps    . Melatonin 1 MG TABS Take 3 mg by mouth at bedtime.     . meloxicam (MOBIC) 15 MG tablet Take 1 tablet (15 mg total) by mouth daily. 90 tablet 3  . mirabegron ER (MYRBETRIQ) 50 MG TB24 tablet Take 50 mg by mouth daily.    . montelukast (SINGULAIR) 10 MG tablet Take 1 tablet (10 mg total) by mouth daily. 90 tablet 3  . Multiple Vitamin (MULTIVITAMIN) tablet Take 1 tablet by mouth daily.      Marland Kitchen  omeprazole (PRILOSEC) 20 MG capsule Take 1 capsule (20 mg total) by mouth 2 (two) times daily before a meal. (Patient taking differently: Take 20 mg by mouth daily. ) 180 capsule 3  . Respiratory Therapy Supplies (FLUTTER) DEVI Use 2-3 times per day 1 each 0  . Spacer/Aero-Holding Chambers (AEROCHAMBER Z-STAT PLUS) inhaler Use as instructed 1 each 0  . Tiotropium Bromide Monohydrate (SPIRIVA RESPIMAT) 2.5 MCG/ACT AERS Inhale 2 puffs into the lungs daily. 3 Inhaler 3  . Vitamin Mixture (VITAMIN E COMPLETE PO) Take 1 capsule by mouth daily. 400iu    . warfarin (COUMADIN) 5 MG tablet Take as directed by anticoagulation clinic 120 tablet 2  . [DISCONTINUED] valsartan (DIOVAN) 160 MG tablet Take 1 tablet  (160 mg total) by mouth daily. 90 tablet 3   No current facility-administered medications on file prior to visit.    Past Medical History  Diagnosis Date  . SBE (subacute bacterial endocarditis) prophylaxis candidate   . COPD (chronic obstructive pulmonary disease) ([place])   . GERD (gastroesophageal reflux disease)   . HLD (hyperlipidemia)   . Osteoarthritis   . Hemorrhoids   . History of cerebrovascular accident   . TIA (transient ischemic attack) last 2001    anticoagulation therapy on coumadin  . HTN (hypertension)   . Stroke ([place])     STROKES  . DI (detrusor instability)   . Status post dilation of esophageal narrowing   . Depression 11/29/2013  . Nephrolithiasis     hx  . Pneumonia 2013  . Shingles since Nov 18, 2013    on right arm from shoulder to wrist  . Dysrhythmia   . Shortness of breath dyspnea   . Anxiety   . History of kidney stones   . Basal cell carcinoma   . Allergic rhinitis     Past Surgical History  Procedure Laterality Date  . Bladder suspension    . Excision of basal cell ca  2011  . Thumb surgery Right 10-15 yrs ago  . Rectocele repair  2008    A&P REPAIR -DR. MCDIARMID  . Anterior and posterior vaginal repair  2008    A&P REPAIR-DR MCDIARMID  . Colonoscopy    . Varicose vein surgery    . Upper gastrointestinal endoscopy    . Esophagogastroduodenoscopy N/A 03/03/2014    Procedure: ESOPHAGOGASTRODUODENOSCOPY (EGD);  Surgeon: Inda Castle, MD;  Location: [facility];  Service: Endoscopy;  Laterality: N/A;  . Balloon dilation N/A 03/03/2014    Procedure: BALLOON DILATION;  Surgeon: Inda Castle, MD;  Location: [facility];  Service: Endoscopy;  Laterality: N/A;  . Vaginal hysterectomy  1983    NO BSO-DR. MABRY  . Anterior lateral lumbar fusion 4 levels Right 07/03/2015    Procedure: Right Lumbar one-two, Lumbar two-three, Lumbar three-four, Lumbar four-five Anterior lateral lumbar interbody fusion;  Surgeon: Erline Levine, MD;  Location: [facility];  Service: Neurosurgery;  Laterality: Right;  Right L1-2 L2-3 L3-4 L4-5 Anterior lateral lumbar interbody fusion  . Lumbar percutaneous pedicle screw 4 level Right 07/03/2015    Procedure: Lumbar one-Sacral one Bilateral percutaneous pedicle screws;  Surgeon: Erline Levine, MD;  Location: [facility];  Service: Neurosurgery;  Laterality: Right;  L1-S1 Bilateral percutaneous pedicle screws    Family History  Problem Relation Age of Onset  . COPD Mother   . Breast cancer Mother     mid 56's  . Uterine cancer Mother   . Emphysema Mother   . Breast cancer Maternal  Aunt     x 3 aunts, post menopausal  . Colon cancer Neg Hx   . Esophageal cancer Neg Hx   . Diabetes Neg Hx   . Kidney disease Neg Hx   . Liver disease Neg Hx   . Heart disease Neg Hx   . Rectal cancer Neg Hx   . Stomach cancer Neg Hx     Social History   Social History  . Marital Status: Married    Spouse Name: N/A  . Number of Children: 2  . Years of Education: N/A   Occupational History  . retired    Social History Main Topics  . Smoking status: Former Smoker -- 3.00 packs/day for 42 years    Types: Cigarettes    Start date: 08/04/1954    Quit date: 12/06/1995  . Smokeless tobacco: Never Used  . Alcohol Use: 0.0 oz/week    0 Standard drinks or equivalent per week     Comment: occas very little  . Drug Use: No  . Sexual Activity: Not Currently    Birth Control/ Protection: Surgical     Comment: HYST-1st intercourse 57 yo-2 partners   Other Topics Concern  . None   Social History Narrative   Retired- Dentist Pulmonary:   Originally from [place]. Has always lived in [place]. She has traveled to [place], [place], [place], [place], [place], [place]. Has been on multiple cruises. 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 She did use a face mask consistently to avoid chemical fume exposure. She may  have only had an inhaled exposure a couple of times. She currently has a dog. Remote exposure to a parakeet. No mold or hot tub exposure. No asbestos exposure.       Objective:   Physical Exam BP 112/62 mmHg  Pulse 80  Ht 5' 4.25" (1.632 m)  Wt 64.864 kg (143 lb)  BMI 24.35 kg/m2  SpO2 96% General:  Awake. Alert. Accompanied by husband today.  Integument:  Warm & dry. No rash on exposed skin.  HEENT:  Oral thrush. No scleral injection or icterus. No nasal turbinate swelling.  Cardiovascular:  Regular rate. No edema. No appreciable JVD.  Pulmonary:  Clear to auscultation bilaterally. Symmetric chest wall expansion. No accessory muscle use on room air.  Abdomen: Soft. Normal bowel sounds. Nondistended.  Musculoskeletal:  Normal bulk and tone. No joint deformity or effusion appreciated.  PFT 03/01/16: FVC 2.55 L (91%) FEV1 1.30 L (62%) FEV1/FVC 0.51 FEF 25-75 0.56 L (34%) no bronchodilator response TLC 5.66 L (111%) RV 116% ERV 133% DLCO uncorrected 62% (hemoglobin 15.1) 12/28/11:FVC 2.89 L (104%) FEV1 1.22 L (62%) FEV1/FVC 0.42 FEF 25-75 0.33 L (15%) no bronchodilator response TLC 6.19 L (129%) RV 163% ERV 114% DLCO uncorrected 69%  IMAGING CXR PA/LAT 11/09/15 (previously reviewed by me): No focal opacity or effusion appreciated. Heart normal in size. Mediastinum normal in contour. Mild hyperinflation with flattening of the diaphragms.   LABS 12/22/15 Alpha-1 antitrypsin: MM (143)    Assessment & Plan:  75 year old female with underlying moderate COPD and mild recent exacerbation due to dust exposure. Previous to this patient's COPD & allergic rhinitis seem to be significantly improved. Patient has good control underlying reflux but may benefit from transition H2-blocker. Spirometry today shows moderate obstruction without significant bronchodilator response. Additionally, her FVC seems to worsen slightly with resolution of her residual volume/air trapping. Instructed  the patient to  contact my office if she has any new breathing problems before next appointment.   1. Moderate COPD: Continuing Spiriva & Symbicort. No changes. Repeat spirometry with bronchodilator challenge next appointment. 2. Allergic rhinitis: Continuing Astelin, Flonase, Singulair, & twice daily nasal saline rinse. Well-controlled. 3. GERD: Follows with GI. transitioning patient off of Prilosec onto Zantac. Recommended follow-up with GI to address dysphasia.  4. Oral thrush: Prescribing nystatin swish and swallow to use until oral thrush is gone. 5. Health maintenance: Influenza vaccine September 2016, Prevnar December 2014, & Pneumovax January 2008. 6. Follow-up: Patient to return to clinic in 3 onths or sooner if needed.  Sonia Baller Ashok Cordia, M.D.  [facility]monary [facility] Critical [facility] Pager:  [phone] After 3pm or if no response, call [phone] 1:40 PM 03/01/2016

## 2016-03-02 ENCOUNTER — Ambulatory Visit (INDEPENDENT_AMBULATORY_CARE_PROVIDER_SITE_OTHER): Payer: Medicare Other | Admitting: General Practice

## 2016-03-02 DIAGNOSIS — G459 Transient cerebral ischemic attack, unspecified: Secondary | ICD-10-CM

## 2016-03-02 DIAGNOSIS — Z7901 Long term (current) use of anticoagulants: Secondary | ICD-10-CM

## 2016-03-02 LAB — POCT INR: INR: 1.8

## 2016-03-02 NOTE — Progress Notes (Signed)
I have reviewed and agree with the plan. 

## 2016-03-02 NOTE — Progress Notes (Signed)
Pre visit review using our clinic review tool, if applicable. No additional management support is needed unless otherwise documented below in the visit note. 

## 2016-03-03 ENCOUNTER — Encounter: Payer: Self-pay | Admitting: Internal Medicine

## 2016-03-03 ENCOUNTER — Telehealth: Payer: Self-pay

## 2016-03-03 ENCOUNTER — Telehealth: Payer: Self-pay | Admitting: General Practice

## 2016-03-03 NOTE — Telephone Encounter (Signed)
-----   Message from Biagio Borg, MD sent at 03/03/2016  3:39 PM EDT ----- Regarding: RE: Lovenox Yes please ----- Message -----    From: Warden Fillers, RN    Sent: 03/03/2016   1:55 PM      To: Biagio Borg, MD Subject: Lovenox                                        Patient will be having surgery on 4/11.  OK to call in Lovenox for patient?  Thanks, Villa Herb, RN

## 2016-03-03 NOTE — Telephone Encounter (Signed)
Patient called in requesting to talk with cindy/coumadin clinic---needs sx clearance with coumadin---i have called cindy (working at [employer] today), cindy will call patient back this afternoon---patient advised----routing to cindy---patient phone [phone]

## 2016-03-04 ENCOUNTER — Other Ambulatory Visit: Payer: Self-pay | Admitting: General Practice

## 2016-03-04 ENCOUNTER — Telehealth: Payer: Self-pay | Admitting: General Practice

## 2016-03-04 MED ORDER — ENOXAPARIN SODIUM 100 MG/ML ~~LOC~~ SOLN: 1.5000 mg/kg | SUBCUTANEOUS | Status: DC

## 2016-03-04 NOTE — Telephone Encounter (Signed)
Instructions for coumadin and Lovenox pre-procedure. 4/5 - last dose of coumadin. 4/6 - Nothing  4/7 - Lovenox in am  4/8 - Lovenox in am  4/9 - Lovenox in am  4/10 - Lovenox in am  4/11 - Procedure (NO LOVENOX)   Patient verbalizes understanding.

## 2016-03-07 NOTE — Patient Instructions (Addendum)
MISHAAL CLATTERBUCK  03/07/2016   Your procedure is scheduled on: 03-15-16  Report to [facility] Main  Entrance take [facility]  elevators to 3rd floor to  [facility] a 1245 pm  Call this number if you have problems the morning of surgery [phone]   Remember: ONLY 1 PERSON MAY GO WITH YOU TO SHORT STAY TO GET  READY MORNING OF YOUR SURGERY.  Do not eat food After Midnight, CLEAR LIQUIDS MIDNIGHT UNTIL 930 AM DAY OF SURGERY, NOTHING BY MOUTH AFTER 930 AM DAY OF SURGEYR.     Take these medicines the morning of surgery with A SIP OF WATER: ALBUTEROL INHALER PRN AND BRING WITH YOU, AMLODINEINE (NORVASC), ASTELIN NASAL SPRAY, SYMBICORT, CITALOPRAM (CELEXA), FLO9NASE NASAL SPRAY, MYBETRIQ, SINGULAIR, OMEPROAZOLE (PROLOSEC)              You may not have any metal on your body including hair pins and              piercings  Do not wear jewelry, make-up, lotions, powders or perfumes, deodorant             Do not wear nail polish.  Do not shave  48 hours prior to surgery.              Men may shave face and neck.   Do not bring valuables to the hospital. [place].  Contacts, dentures or bridgework may not be worn into surgery.  Leave suitcase in the car. After surgery it may be brought to your room.     Patients discharged the day of surgery will not be allowed to drive home.  Name and phone number of your driver:  Special Instructions: N/A              Please read over the following fact sheets you were given: _____________________________________________________________________             [facility] - Preparing for Surgery Before surgery, you can play an important role.  Because skin is not sterile, your skin needs to be as free of germs as possible.  You can reduce the number of germs on your skin by washing with CHG (chlorahexidine gluconate) soap before surgery.  CHG is an antiseptic cleaner which kills germs  and bonds with the skin to continue killing germs even after washing. Please DO NOT use if you have an allergy to CHG or antibacterial soaps.  If your skin becomes reddened/irritated stop using the CHG and inform your nurse when you arrive at Short Stay. Do not shave (including legs and underarms) for at least 48 hours prior to the first CHG shower.  You may shave your face/neck. Please follow these instructions carefully:  1.  Shower with CHG Soap the night before surgery and the  morning of Surgery.  2.  If you choose to wash your hair, wash your hair first as usual with your  normal  shampoo.  3.  After you shampoo, rinse your hair and body thoroughly to remove the  shampoo.                           4.  Use CHG as you would any other liquid soap.  You can apply chg  directly  to the skin and wash                       Gently with a scrungie or clean washcloth.  5.  Apply the CHG Soap to your body ONLY FROM THE NECK DOWN.   Do not use on face/ open                           Wound or open sores. Avoid contact with eyes, ears mouth and genitals (private parts).                       Wash face,  Genitals (private parts) with your normal soap.             6.  Wash thoroughly, paying special attention to the area where your surgery  will be performed.  7.  Thoroughly rinse your body with warm water from the neck down.  8.  DO NOT shower/wash with your normal soap after using and rinsing off  the CHG Soap.                9.  Pat yourself dry with a clean towel.            10.  Wear clean pajamas.            11.  Place clean sheets on your bed the night of your first shower and do not  sleep with pets. Day of Surgery : Do not apply any lotions/deodorants the morning of surgery.  Please wear clean clothes to the hospital/surgery center.  FAILURE TO FOLLOW THESE INSTRUCTIONS MAY RESULT IN THE CANCELLATION OF YOUR SURGERY PATIENT SIGNATURE_________________________________  NURSE  SIGNATURE__________________________________  ________________________________________________________________________    CLEAR LIQUID DIET   Foods Allowed                                                                     Foods Excluded  Coffee and tea, regular and decaf                             liquids that you cannot  Plain Jell-O in any flavor                                             see through such as: Fruit ices (not with fruit pulp)                                     milk, soups, orange juice  Iced Popsicles                                    All solid food Carbonated beverages, regular and diet  Cranberry, grape and apple juices Sports drinks like Gatorade Lightly seasoned clear broth or consume(fat free) Sugar, honey syrup  Sample Menu Breakfast                                Lunch                                     Supper Cranberry juice                    Beef broth                            Chicken broth Jell-O                                     Grape juice                           Apple juice Coffee or tea                        Jell-O                                      Popsicle                                                Coffee or tea                        Coffee or tea  _____________________________________________________________________    Incentive Spirometer  An incentive spirometer is a tool that can help keep your lungs clear and active. This tool measures how well you are filling your lungs with each breath. Taking long deep breaths may help reverse or decrease the chance of developing breathing (pulmonary) problems (especially infection) following:  A long period of time when you are unable to move or be active. BEFORE THE PROCEDURE   If the spirometer includes an indicator to show your best effort, your nurse or respiratory therapist will set it to a desired goal.  If possible, sit up straight or lean  slightly forward. Try not to slouch.  Hold the incentive spirometer in an upright position. INSTRUCTIONS FOR USE   Sit on the edge of your bed if possible, or sit up as far as you can in bed or on a chair.  Hold the incentive spirometer in an upright position.  Breathe out normally.  Place the mouthpiece in your mouth and seal your lips tightly around it.  Breathe in slowly and as deeply as possible, raising the piston or the ball toward the top of the column.  Hold your breath for 3-5 seconds or for as long as possible. Allow the piston or ball to fall to the bottom of the column.  Remove the mouthpiece from your mouth and breathe out normally.  Rest for a few seconds and repeat Steps 1 through 7 at  least 10 times every 1-2 hours when you are awake. Take your time and take a few normal breaths between deep breaths.  The spirometer may include an indicator to show your best effort. Use the indicator as a goal to work toward during each repetition.  After each set of 10 deep breaths, practice coughing to be sure your lungs are clear. If you have an incision (the cut made at the time of surgery), support your incision when coughing by placing a pillow or rolled up towels firmly against it. Once you are able to get out of bed, walk around indoors and cough well. You may stop using the incentive spirometer when instructed by your caregiver.  RISKS AND COMPLICATIONS  Take your time so you do not get dizzy or light-headed.  If you are in pain, you may need to take or ask for pain medication before doing incentive spirometry. It is harder to take a deep breath if you are having pain. AFTER USE  Rest and breathe slowly and easily.  It can be helpful to keep track of a log of your progress. Your caregiver can provide you with a simple table to help with this. If you are using the spirometer at home, follow these instructions: [place] IF:   You are having difficultly using the  spirometer.  You have trouble using the spirometer as often as instructed.  Your pain medication is not giving enough relief while using the spirometer.  You develop fever of 100.5 F (38.1 C) or higher. SEEK IMMEDIATE MEDICAL CARE IF:   You cough up bloody sputum that had not been present before.  You develop fever of 102 F (38.9 C) or greater.  You develop worsening pain at or near the incision site. MAKE SURE YOU:   Understand these instructions.  Will watch your condition.  Will get help right away if you are not doing well or get worse. Document Released: 04/03/2007 Document Revised: 02/13/2012 Document Reviewed: 06/04/2007 Exit[employer] Patient Information 2014 Exit[employer], [place].   ________________________________________________________________________  WHAT IS A BLOOD TRANSFUSION? Blood Transfusion Information  A transfusion is the replacement of blood or some of its parts. Blood is made up of multiple cells which provide different functions.  Red blood cells carry oxygen and are used for blood loss replacement.  White blood cells fight against infection.  Platelets control bleeding.  Plasma helps clot blood.  Other blood products are available for specialized needs, such as hemophilia or other clotting disorders. BEFORE THE TRANSFUSION  Who gives blood for transfusions?   Healthy volunteers who are fully evaluated to make sure their blood is safe. This is blood bank blood. Transfusion therapy is the safest it has ever been in the practice of medicine. Before blood is taken from a donor, a complete history is taken to make sure that person has no history of diseases nor engages in risky social behavior (examples are intravenous drug use or sexual activity with multiple partners). The donor's travel history is screened to minimize risk of transmitting infections, such as malaria. The donated blood is tested for signs of infectious diseases, such as HIV and hepatitis.  The blood is then tested to be sure it is compatible with you in order to minimize the chance of a transfusion reaction. If you or a relative donates blood, this is often done in anticipation of surgery and is not appropriate for emergency situations. It takes many days to process the donated blood. RISKS AND COMPLICATIONS Although transfusion  therapy is very safe and saves many lives, the main dangers of transfusion include:   Getting an infectious disease.  Developing a transfusion reaction. This is an allergic reaction to something in the blood you were given. Every precaution is taken to prevent this. The decision to have a blood transfusion has been considered carefully by your caregiver before blood is given. Blood is not given unless the benefits outweigh the risks. AFTER THE TRANSFUSION  Right after receiving a blood transfusion, you will usually feel much better and more energetic. This is especially true if your red blood cells have gotten low (anemic). The transfusion raises the level of the red blood cells which carry oxygen, and this usually causes an energy increase.  The nurse administering the transfusion will monitor you carefully for complications. HOME CARE INSTRUCTIONS  No special instructions are needed after a transfusion. You may find your energy is better. Speak with your caregiver about any limitations on activity for underlying diseases you may have. SEEK MEDICAL CARE IF:   Your condition is not improving after your transfusion.  You develop redness or irritation at the intravenous (IV) site. SEEK IMMEDIATE MEDICAL CARE IF:  Any of the following symptoms occur over the next 12 hours:  Shaking chills.  You have a temperature by mouth above 102 F (38.9 C), not controlled by medicine.  Chest, back, or muscle pain.  People around you feel you are not acting correctly or are confused.  Shortness of breath or difficulty breathing.  Dizziness and fainting.  You  get a rash or develop hives.  You have a decrease in urine output.  Your urine turns a dark color or changes to pink, red, or brown. Any of the following symptoms occur over the next 10 days:  You have a temperature by mouth above 102 F (38.9 C), not controlled by medicine.  Shortness of breath.  Weakness after normal activity.  The white part of the eye turns yellow (jaundice).  You have a decrease in the amount of urine or are urinating less often.  Your urine turns a dark color or changes to pink, red, or brown. Document Released: 11/18/2000 Document Revised: 02/13/2012 Document Reviewed: 07/07/2008 [facility] Patient Information 2014 [place], [place].  _______________________________________________________________________

## 2016-03-08 ENCOUNTER — Encounter (HOSPITAL_COMMUNITY): Payer: Self-pay

## 2016-03-08 ENCOUNTER — Encounter (HOSPITAL_COMMUNITY)
Admission: RE | Admit: 2016-03-08 | Discharge: 2016-03-08 | Disposition: A | Discharge status: Home / Self Care | Payer: Medicare Other | Source: Ambulatory Visit | Attending: Orthopedic Surgery | Admitting: Orthopedic Surgery

## 2016-03-08 DIAGNOSIS — Z01812 Encounter for preprocedural laboratory examination: Secondary | ICD-10-CM | POA: Diagnosis not present

## 2016-03-08 DIAGNOSIS — Z01810 Encounter for preprocedural cardiovascular examination: Secondary | ICD-10-CM | POA: Diagnosis not present

## 2016-03-08 LAB — CBC
HCT: 41.9 % (ref 36.0–46.0)
Hemoglobin: 14 g/dL (ref 12.0–15.0)
MCH: 27.3 pg (ref 26.0–34.0)
MCHC: 33.4 g/dL (ref 30.0–36.0)
MCV: 81.7 fL (ref 78.0–100.0)
Platelets: 185 10*3/uL (ref 150–400)
RBC: 5.13 MIL/uL — ABNORMAL HIGH (ref 3.87–5.11)
RDW: 15.6 % — ABNORMAL HIGH (ref 11.5–15.5)
WBC: 5.3 10*3/uL (ref 4.0–10.5)

## 2016-03-08 LAB — BASIC METABOLIC PANEL
Anion gap: 10 (ref 5–15)
BUN: 37 mg/dL — ABNORMAL HIGH (ref 6–20)
CO2: 26 mmol/L (ref 22–32)
Calcium: 9.8 mg/dL (ref 8.9–10.3)
Chloride: 104 mmol/L (ref 101–111)
Creatinine, Ser: 1.01 mg/dL — ABNORMAL HIGH (ref 0.44–1.00)
GFR calc Af Amer: >60 mL/min (ref >60)
GFR calc non Af Amer: 53 mL/min — ABNORMAL LOW (ref >60)
Glucose, Bld: 90 mg/dL (ref 65–99)
Potassium: 5.1 mmol/L (ref 3.5–5.1)
Sodium: 140 mmol/L (ref 135–145)

## 2016-03-08 NOTE — Progress Notes (Signed)
BMET RESULTS FAXED TO DR Alvan Dame BY EPIC

## 2016-03-10 NOTE — H&P (Signed)
[name] is an 75 y.o. female.    Chief Complaint:   Right gluteus medius tear   Procedure:    Repair right gluteus medius  HPI: Pt is a 75 y.o. female complaining of right gluteus pain for 2+ years. Pain had continually increased since the beginning. When the pain is at its worst it is between 8 and 9 scale of 10. Patient states the pain affects her all the time except for when she is sleeping. Patient has tried anti-inflammatories and or analgesic medications, activity modification and assistance devices. All these have not significantly helped her pain. Discussion was had with Dr. Alvan Dame regarding the outcome of surgery, patient states that Dr. Alvan Dame stated that she had about a 50% chance of surgery helping her problem. After discussion with her husband it was felt that she 50% was well worth a chance to take to relieve her pain.  Risks, benefits and expectations were discussed with the patient. Patient understand the risks, benefits and expectations and wishes to proceed with surgery.    PCP: Cathlean Cower, MD  D/C Plans:      Home with HHPT  Post-op Meds:       No Rx given  Tranexamic Acid:      To be given - IV   Decadron:      Is to be given  FYI:     Coumadin with branching Lovenox  Norco   PMH: Past Medical History  Diagnosis Date  . COPD (chronic obstructive pulmonary disease) ([place])   . GERD (gastroesophageal reflux disease)   . HLD (hyperlipidemia)   . Osteoarthritis   . Hemorrhoids   . History of cerebrovascular accident   . TIA (transient ischemic attack) last 2001    anticoagulation therapy on coumadin  . HTN (hypertension)   . DI (detrusor instability)   . Status post dilation of esophageal narrowing   . Depression 11/29/2013  . Nephrolithiasis     hx  . Pneumonia 2013  . Shingles since Nov 18, 2013    on right arm from shoulder to wrist  . Dysrhythmia   . Anxiety   . History of kidney stones   . Basal cell carcinoma   . Allergic rhinitis   . Stroke  [facility]) 2001    STROKES, TIA'S  . Shortness of breath dyspnea     WITH EXERTION     PSH: Past Surgical History  Procedure Laterality Date  . Bladder suspension    . Excision of basal cell ca  2011  . Thumb surgery Right 10-15 yrs ago  . Rectocele repair  2008    A&P REPAIR -DR. MCDIARMID  . Anterior and posterior vaginal repair  2008    A&P REPAIR-DR MCDIARMID  . Colonoscopy    . Varicose vein surgery    . Upper gastrointestinal endoscopy    . Esophagogastroduodenoscopy N/A 03/03/2014    Procedure: ESOPHAGOGASTRODUODENOSCOPY (EGD);  Surgeon: Inda Castle, MD;  Location: [facility];  Service: Endoscopy;  Laterality: N/A;  . Balloon dilation N/A 03/03/2014    Procedure: BALLOON DILATION;  Surgeon: Inda Castle, MD;  Location: [facility] ENDOSCOPY;  Service: Endoscopy;  Laterality: N/A;  . Anterior lateral lumbar fusion 4 levels Right 07/03/2015    Procedure: Right Lumbar one-two, Lumbar two-three, Lumbar three-four, Lumbar four-five Anterior lateral lumbar interbody fusion;  Surgeon: Erline Levine, MD;  Location: [facility] ORS;  Service: Neurosurgery;  Laterality: Right;  Right L1-2 L2-3 L3-4 L4-5 Anterior lateral lumbar interbody fusion  .  Lumbar percutaneous pedicle screw 4 level Right 07/03/2015    Procedure: Lumbar one-Sacral one Bilateral percutaneous pedicle screws;  Surgeon: Erline Levine, MD;  Location: [facility] NE[facility] ORS;  Service: Neurosurgery;  Laterality: Right;  L1-S1 Bilateral percutaneous pedicle screws  . Vaginal hysterectomy  1983    NO BSO-DR. MABRY    Social History:  reports that she quit smoking about 20 years ago. Her smoking use included Cigarettes. She started smoking about 61 years ago. She has a 126 pack-year smoking history. She has never used smokeless tobacco. She reports that she drinks alcohol. She reports that she does not use illicit drugs.  Allergies:  Allergies  Allergen Reactions  . Clarithromycin     REACTION: possible rash but could have been legionarres dz  with rash  . Lipitor [Atorvastatin]     MUSCLE SPASMS  . Simvastatin   . Crestor [Rosuvastatin Calcium]     fagitue and joint pain, "NO STATINS"    Medications: No current facility-administered medications for this encounter.   Current Outpatient Prescriptions  Medication Sig Dispense Refill  . albuterol (PROVENTIL HFA;VENTOLIN HFA) 108 (90 BASE) MCG/ACT inhaler Inhale 1-2 puffs into the lungs every 6 (six) hours as needed for wheezing or shortness of breath. 3 Inhaler 4  . amLODipine (NORVASC) 5 MG tablet Take 1 tablet (5 mg total) by mouth every morning. 90 tablet 3  . azelastine (ASTELIN) 0.1 % nasal spray Place 2 sprays into the nose daily. Use in each nostril as directed 90 mL 1  . benzonatate (TESSALON) 100 MG capsule Take 100 mg by mouth 3 (three) times daily as needed for cough.    . budesonide-formoterol (SYMBICORT) 160-4.5 MCG/ACT inhaler Inhale 2 puffs into the lungs 2 (two) times daily. 3 Inhaler 4  . Calcium Carbonate-Vitamin D (CALCIUM + D PO) Take 1 tablet by mouth 2 (two) times daily.     . citalopram (CELEXA) 20 MG tablet Take 1 tablet (20 mg total) by mouth daily. 90 tablet 3  . cyclobenzaprine (FLEXERIL) 5 MG tablet Take 1 tablet (5 mg total) by mouth 3 (three) times daily as needed for muscle spasms. 270 tablet 0  . fluticasone (FLONASE) 50 MCG/ACT nasal spray Place 1 spray into both nostrils 2 (two) times daily. 48 g 1  . irbesartan (AVAPRO) 150 MG tablet Take 1 tablet (150 mg total) by mouth every morning. 90 tablet 3  . lidocaine (XYLOCAINE JELLY) 2 % jelly Apply 1 application topically as needed. (Patient taking differently: Apply 1 application topically as needed. For shingles) 30 mL 1  . Magnesium 250 MG TABS Take 1 tablet by mouth daily as needed. Leg cramps    . Melatonin 1 MG TABS Take 3 mg by mouth at bedtime.     . meloxicam (MOBIC) 15 MG tablet Take 1 tablet (15 mg total) by mouth daily. 90 tablet 3  . mirabegron ER (MYRBETRIQ) 50 MG TB24 tablet Take 50 mg  by mouth daily.    . montelukast (SINGULAIR) 10 MG tablet Take 1 tablet (10 mg total) by mouth daily. (Patient taking differently: Take 10 mg by mouth every morning. ) 90 tablet 3  . Multiple Vitamin (MULTIVITAMIN) tablet Take 1 tablet by mouth daily.      Marland Kitchen nystatin (MYCOSTATIN) 100000 UNIT/ML suspension Swish and swallow 4 times a day as needed (Patient taking differently: Take 5 mLs by mouth 2 (two) times daily. ) 120 mL 1  . omeprazole (PRILOSEC) 20 MG capsule Take 1 capsule (20 mg total)  by mouth 2 (two) times daily before a meal. (Patient taking differently: Take 20 mg by mouth daily. ) 180 capsule 3  . ranitidine (ZANTAC) 150 MG tablet Take 1 tablet (150 mg total) by mouth at bedtime. 90 tablet 1  . Tiotropium Bromide Monohydrate (SPIRIVA RESPIMAT) 2.5 MCG/ACT AERS Inhale 2 puffs into the lungs daily. 3 Inhaler 3  . Vitamin Mixture (VITAMIN E COMPLETE PO) Take 1 capsule by mouth daily. 400iu    . warfarin (COUMADIN) 5 MG tablet Take as directed by anticoagulation clinic (Patient taking differently: Take 5-7.5 mg by mouth every evening. 7.35m  every day except Monday and Sunday when patient only takes 5 mg.) 120 tablet 2  . amoxicillin-clavulanate (AUGMENTIN) 875-125 MG tablet Take 1 tablet by mouth 2 (two) times daily with a meal.  0  . enoxaparin (LOVENOX) 100 MG/ML injection Inject 0.95 mLs (95 mg total) into the skin daily. 4 Syringe 0  . Respiratory Therapy Supplies (FLUTTER) DEVI Use 2-3 times per day 1 each 0  . Spacer/Aero-Holding Chambers (AEROCHAMBER Z-STAT PLUS) inhaler Use as instructed 1 each 0  . [DISCONTINUED] valsartan (DIOVAN) 160 MG tablet Take 1 tablet (160 mg total) by mouth daily. 90 tablet 3    Results for orders placed or performed during the hospital encounter of 03/08/16 (from the past 48 hour(s))  Basic metabolic panel     Status: Abnormal   Collection Time: 03/08/16 10:35 AM  Result Value Ref Range   Sodium 140 135 - 145 mmol/L   Potassium 5.1 3.5 - 5.1 mmol/L    Chloride 104 101 - 111 mmol/L   CO2 26 22 - 32 mmol/L   Glucose, Bld 90 65 - 99 mg/dL   BUN 37 (H) 6 - 20 mg/dL   Creatinine, Ser 1.01 (H) 0.44 - 1.00 mg/dL   Calcium 9.8 8.9 - 10.3 mg/dL   GFR calc non Af Amer 53 (L) >60 mL/min   GFR calc Af Amer >60 >60 mL/min    Comment: (NOTE) The eGFR has been calculated using the CKD EPI equation. This calculation has not been validated in all clinical situations. eGFR's persistently <60 mL/min signify possible Chronic Kidney Disease.    Anion gap 10 5 - 15  CBC     Status: Abnormal   Collection Time: 03/08/16 10:35 AM  Result Value Ref Range   WBC 5.3 4.0 - 10.5 K/uL   RBC 5.13 (H) 3.87 - 5.11 MIL/uL   Hemoglobin 14.0 12.0 - 15.0 g/dL   HCT 41.9 36.0 - 46.0 %   MCV 81.7 78.0 - 100.0 fL   MCH 27.3 26.0 - 34.0 pg   MCHC 33.4 30.0 - 36.0 g/dL   RDW 15.6 (H) 11.5 - 15.5 %   Platelets 185 150 - 400 K/uL  Type and screen Order type and screen if day of surgery is less than 15 days from draw of preadmission visit or order morning of surgery if day of surgery is greater than 6 days from preadmission visit.     Status: None   Collection Time: 03/08/16 10:35 AM  Result Value Ref Range   ABO/RH(D) O POS    Antibody Screen NEG    Sample Expiration 03/22/2016    Extend sample reason NO TRANSFUSIONS OR PREGNANCY IN THE PAST 3 MONTHS       Review of Systems  Constitutional: Negative.   HENT: Negative.   Eyes: Negative.   Respiratory: Negative.   Cardiovascular: Negative.   Gastrointestinal: Positive for heartburn.  Genitourinary: Positive for frequency.  Musculoskeletal: Positive for joint pain.  Skin: Negative.   Neurological: Negative.   Endo/Heme/Allergies: Positive for environmental allergies.  Psychiatric/Behavioral: Positive for depression. The patient is nervous/anxious.        Physical Exam  Constitutional: She is oriented to person, place, and time. She appears well-developed.  HENT:  Head: Normocephalic.  Eyes: Pupils are  equal, round, and reactive to light.  Neck: Neck supple. No JVD present. No tracheal deviation present. No thyromegaly present.  Cardiovascular: Normal rate, regular rhythm, normal heart sounds and intact distal pulses.   Respiratory: Effort normal and breath sounds normal. No stridor. No respiratory distress. She has no wheezes.  GI: Soft. There is no tenderness. There is no guarding.  Musculoskeletal:       Back:  Lymphadenopathy:    She has no cervical adenopathy.  Neurological: She is alert and oriented to person, place, and time.  Skin: Skin is warm and dry.  Psychiatric: She has a normal mood and affect.       Assessment/Plan Assessment:   Right gluteus medius tear  Plan: Patient will undergo a repair of the right gluteus medius on 03/15/2016 per Dr. Alvan Dame at [facility]. Risks benefits and expectations were discussed with the patient. Patient understand risks, benefits and expectations and wishes to proceed.   [place] Pugh Ebrahim Deremer   PA-C  03/10/2016, 8:55 AM

## 2016-03-12 ENCOUNTER — Encounter: Payer: Self-pay | Admitting: Internal Medicine

## 2016-03-14 MED ORDER — IRBESARTAN 150 MG PO TABS: 150.0000 mg | ORAL_TABLET | Freq: Every morning | ORAL | Status: DC

## 2016-03-14 NOTE — Progress Notes (Signed)
Medical clearance note dr Jenny Reichmann on chart for 03-15-16 surgery

## 2016-03-15 ENCOUNTER — Encounter (HOSPITAL_COMMUNITY): Payer: Self-pay | Admitting: *Deleted

## 2016-03-15 ENCOUNTER — Observation Stay (HOSPITAL_COMMUNITY)
Admission: RE | Admit: 2016-03-15 | Discharge: 2016-03-16 | Disposition: A | Discharge status: Home / Self Care | Payer: Medicare Other | Source: Ambulatory Visit | Attending: Orthopedic Surgery | Admitting: Orthopedic Surgery

## 2016-03-15 ENCOUNTER — Inpatient Hospital Stay (HOSPITAL_COMMUNITY): Payer: Medicare Other | Admitting: Anesthesiology

## 2016-03-15 ENCOUNTER — Encounter (HOSPITAL_COMMUNITY)
Admission: RE | Disposition: A | Discharge status: Home / Self Care | Payer: Self-pay | Source: Ambulatory Visit | Attending: Orthopedic Surgery

## 2016-03-15 DIAGNOSIS — I10 Essential (primary) hypertension: Secondary | ICD-10-CM | POA: Insufficient documentation

## 2016-03-15 DIAGNOSIS — S76011A Strain of muscle, fascia and tendon of right hip, initial encounter: Principal | ICD-10-CM | POA: Insufficient documentation

## 2016-03-15 DIAGNOSIS — S76311A Strain of muscle, fascia and tendon of the posterior muscle group at thigh level, right thigh, initial encounter: Secondary | ICD-10-CM | POA: Diagnosis not present

## 2016-03-15 DIAGNOSIS — I129 Hypertensive chronic kidney disease with stage 1 through stage 4 chronic kidney disease, or unspecified chronic kidney disease: Secondary | ICD-10-CM | POA: Diagnosis not present

## 2016-03-15 DIAGNOSIS — Z87891 Personal history of nicotine dependence: Secondary | ICD-10-CM | POA: Diagnosis not present

## 2016-03-15 DIAGNOSIS — X58XXXA Exposure to other specified factors, initial encounter: Secondary | ICD-10-CM | POA: Diagnosis not present

## 2016-03-15 DIAGNOSIS — Z8673 Personal history of transient ischemic attack (TIA), and cerebral infarction without residual deficits: Secondary | ICD-10-CM | POA: Insufficient documentation

## 2016-03-15 DIAGNOSIS — N189 Chronic kidney disease, unspecified: Secondary | ICD-10-CM | POA: Diagnosis not present

## 2016-03-15 DIAGNOSIS — M7601 Gluteal tendinitis, right hip: Secondary | ICD-10-CM | POA: Diagnosis not present

## 2016-03-15 DIAGNOSIS — M67853 Other specified disorders of tendon, right hip: Secondary | ICD-10-CM | POA: Diagnosis not present

## 2016-03-15 DIAGNOSIS — M1990 Unspecified osteoarthritis, unspecified site: Secondary | ICD-10-CM | POA: Diagnosis not present

## 2016-03-15 DIAGNOSIS — J449 Chronic obstructive pulmonary disease, unspecified: Secondary | ICD-10-CM | POA: Insufficient documentation

## 2016-03-15 DIAGNOSIS — M7061 Trochanteric bursitis, right hip: Secondary | ICD-10-CM | POA: Diagnosis not present

## 2016-03-15 DIAGNOSIS — S76019A Strain of muscle, fascia and tendon of unspecified hip, initial encounter: Secondary | ICD-10-CM | POA: Diagnosis present

## 2016-03-15 HISTORY — PX: GLUTEUS MINIMUS REPAIR: SHX5843

## 2016-03-15 LAB — TYPE AND SCREEN
ABO/RH(D): O POS
Antibody Screen: NEGATIVE

## 2016-03-15 LAB — CBC
HCT: 38.4 % (ref 36.0–46.0)
Hemoglobin: 12.7 g/dL (ref 12.0–15.0)
MCH: 27.6 pg (ref 26.0–34.0)
MCHC: 33.1 g/dL (ref 30.0–36.0)
MCV: 83.5 fL (ref 78.0–100.0)
Platelets: 138 10*3/uL — ABNORMAL LOW (ref 150–400)
RBC: 4.6 MIL/uL (ref 3.87–5.11)
RDW: 16.1 % — ABNORMAL HIGH (ref 11.5–15.5)
WBC: 8.9 10*3/uL (ref 4.0–10.5)

## 2016-03-15 LAB — CREATININE, SERUM
Creatinine, Ser: 0.94 mg/dL (ref 0.44–1.00)
GFR calc Af Amer: >60 mL/min (ref >60)
GFR calc non Af Amer: 57 mL/min — ABNORMAL LOW (ref >60)

## 2016-03-15 LAB — PROTIME-INR
INR: 0.98 (ref 0.00–1.49)
Prothrombin Time: 13.2 seconds (ref 11.6–15.2)

## 2016-03-15 SURGERY — REPAIR, TENDON, GLUTEUS MINIMUS
Anesthesia: General | Site: Hip | Laterality: Right

## 2016-03-15 MED ORDER — FLUTICASONE PROPIONATE 50 MCG/ACT NA SUSP
1.0000 | Freq: Two times a day (BID) | NASAL | Status: DC
  Administered 2016-03-15 – 2016-03-16 (×2): 1 via NASAL
  Filled 2016-03-15: qty 16

## 2016-03-15 MED ORDER — FENTANYL CITRATE (PF) 100 MCG/2ML IJ SOLN
INTRAMUSCULAR | Status: AC
  Filled 2016-03-15: qty 2

## 2016-03-15 MED ORDER — PROPOFOL 10 MG/ML IV BOLUS
INTRAVENOUS | Status: DC | PRN
  Administered 2016-03-15: 100 mg via INTRAVENOUS

## 2016-03-15 MED ORDER — DEXAMETHASONE SODIUM PHOSPHATE 10 MG/ML IJ SOLN
INTRAMUSCULAR | Status: AC
  Filled 2016-03-15: qty 1

## 2016-03-15 MED ORDER — ALUM & MAG HYDROXIDE-SIMETH 200-200-20 MG/5ML PO SUSP
30.0000 mL | ORAL | Status: DC | PRN
  Administered 2016-03-15: 30 mL via ORAL
  Filled 2016-03-15: qty 30

## 2016-03-15 MED ORDER — MAGNESIUM CITRATE PO SOLN: 1.0000 | Freq: Once | ORAL | Status: DC | PRN

## 2016-03-15 MED ORDER — LIDOCAINE HCL 2 % EX GEL
1.0000 "application " | CUTANEOUS | Status: DC | PRN
  Filled 2016-03-15: qty 5

## 2016-03-15 MED ORDER — AMLODIPINE BESYLATE 5 MG PO TABS
5.0000 mg | ORAL_TABLET | Freq: Every morning | ORAL | Status: DC
  Filled 2016-03-15: qty 1

## 2016-03-15 MED ORDER — ONDANSETRON HCL 4 MG/2ML IJ SOLN
INTRAMUSCULAR | Status: AC
  Filled 2016-03-15: qty 2

## 2016-03-15 MED ORDER — NON FORMULARY
20.0000 mg | Freq: Two times a day (BID) | Status: DC
Start: 2016-03-15 — End: 2016-03-15

## 2016-03-15 MED ORDER — OMEPRAZOLE 20 MG PO CPDR
20.0000 mg | DELAYED_RELEASE_CAPSULE | Freq: Two times a day (BID) | ORAL | Status: DC
  Administered 2016-03-15 – 2016-03-16 (×2): 20 mg via ORAL
  Filled 2016-03-15 (×3): qty 1

## 2016-03-15 MED ORDER — HYDROCODONE-ACETAMINOPHEN 7.5-325 MG PO TABS
1.0000 | ORAL_TABLET | ORAL | Status: DC
  Administered 2016-03-15 (×2): 1 via ORAL
  Administered 2016-03-15 – 2016-03-16 (×4): 2 via ORAL
  Filled 2016-03-15 (×3): qty 2
  Filled 2016-03-15 (×2): qty 1
  Filled 2016-03-15: qty 2

## 2016-03-15 MED ORDER — CEFAZOLIN SODIUM-DEXTROSE 2-4 GM/100ML-% IV SOLN
2.0000 g | INTRAVENOUS | Status: AC
Start: 2016-03-16 — End: 2016-03-15
  Administered 2016-03-15: 2 g via INTRAVENOUS
  Filled 2016-03-15: qty 100

## 2016-03-15 MED ORDER — ACETAMINOPHEN 10 MG/ML IV SOLN
INTRAVENOUS | Status: DC | PRN
  Administered 2016-03-15: 1000 mg via INTRAVENOUS

## 2016-03-15 MED ORDER — PHENYLEPHRINE HCL 10 MG/ML IJ SOLN
INTRAMUSCULAR | Status: DC | PRN
  Administered 2016-03-15 (×2): 80 ug via INTRAVENOUS

## 2016-03-15 MED ORDER — HYDROMORPHONE HCL 1 MG/ML IJ SOLN
INTRAMUSCULAR | Status: AC
  Filled 2016-03-15: qty 1

## 2016-03-15 MED ORDER — DIPHENHYDRAMINE HCL 25 MG PO CAPS
25.0000 mg | ORAL_CAPSULE | Freq: Four times a day (QID) | ORAL | Status: DC | PRN
  Administered 2016-03-15: 25 mg via ORAL
  Filled 2016-03-15 (×2): qty 1

## 2016-03-15 MED ORDER — PROPOFOL 10 MG/ML IV BOLUS
INTRAVENOUS | Status: AC
  Filled 2016-03-15: qty 20

## 2016-03-15 MED ORDER — CITALOPRAM HYDROBROMIDE 20 MG PO TABS
20.0000 mg | ORAL_TABLET | Freq: Every day | ORAL | Status: DC
  Administered 2016-03-16: 20 mg via ORAL
  Filled 2016-03-15: qty 1

## 2016-03-15 MED ORDER — PHENYLEPHRINE 40 MCG/ML (10ML) SYRINGE FOR IV PUSH (FOR BLOOD PRESSURE SUPPORT)
PREFILLED_SYRINGE | INTRAVENOUS | Status: AC
  Filled 2016-03-15: qty 10

## 2016-03-15 MED ORDER — LIDOCAINE HCL (CARDIAC) 20 MG/ML IV SOLN
INTRAVENOUS | Status: AC
  Filled 2016-03-15: qty 5

## 2016-03-15 MED ORDER — FERROUS SULFATE 325 (65 FE) MG PO TABS
325.0000 mg | ORAL_TABLET | Freq: Three times a day (TID) | ORAL | Status: DC
  Administered 2016-03-16 (×2): 325 mg via ORAL
  Filled 2016-03-15 (×4): qty 1

## 2016-03-15 MED ORDER — TRANEXAMIC ACID 1000 MG/10ML IV SOLN
1000.0000 mg | Freq: Once | INTRAVENOUS | Status: AC
  Administered 2016-03-15: 1000 mg via INTRAVENOUS
  Filled 2016-03-15: qty 10

## 2016-03-15 MED ORDER — ENOXAPARIN SODIUM 40 MG/0.4ML ~~LOC~~ SOLN
40.0000 mg | SUBCUTANEOUS | Status: DC
  Administered 2016-03-16: 40 mg via SUBCUTANEOUS
  Filled 2016-03-15 (×2): qty 0.4

## 2016-03-15 MED ORDER — ONDANSETRON HCL 4 MG PO TABS: 4.0000 mg | ORAL_TABLET | Freq: Four times a day (QID) | ORAL | Status: DC | PRN

## 2016-03-15 MED ORDER — DOCUSATE SODIUM 100 MG PO CAPS
100.0000 mg | ORAL_CAPSULE | Freq: Two times a day (BID) | ORAL | Status: DC
  Administered 2016-03-15 – 2016-03-16 (×2): 100 mg via ORAL

## 2016-03-15 MED ORDER — SODIUM CHLORIDE 0.9 % IJ SOLN
INTRAMUSCULAR | Status: AC
  Filled 2016-03-15: qty 10

## 2016-03-15 MED ORDER — CEFAZOLIN SODIUM-DEXTROSE 2-4 GM/100ML-% IV SOLN
2.0000 g | Freq: Four times a day (QID) | INTRAVENOUS | Status: AC
  Administered 2016-03-15 – 2016-03-16 (×2): 2 g via INTRAVENOUS
  Filled 2016-03-15 (×2): qty 100

## 2016-03-15 MED ORDER — WARFARIN SODIUM 7.5 MG PO TABS
7.5000 mg | ORAL_TABLET | Freq: Once | ORAL | Status: AC
  Administered 2016-03-15: 7.5 mg via ORAL
  Filled 2016-03-15: qty 1

## 2016-03-15 MED ORDER — DEXAMETHASONE SODIUM PHOSPHATE 10 MG/ML IJ SOLN
10.0000 mg | Freq: Once | INTRAMUSCULAR | Status: AC
  Administered 2016-03-16: 10 mg via INTRAVENOUS
  Filled 2016-03-15: qty 1

## 2016-03-15 MED ORDER — POLYETHYLENE GLYCOL 3350 17 G PO PACK
17.0000 g | PACK | Freq: Two times a day (BID) | ORAL | Status: DC
  Administered 2016-03-15 – 2016-03-16 (×2): 17 g via ORAL

## 2016-03-15 MED ORDER — TIOTROPIUM BROMIDE MONOHYDRATE 18 MCG IN CAPS
1.0000 | ORAL_CAPSULE | Freq: Every day | RESPIRATORY_TRACT | Status: DC
  Administered 2016-03-16: 18 ug via RESPIRATORY_TRACT
  Filled 2016-03-15: qty 5

## 2016-03-15 MED ORDER — IRBESARTAN 150 MG PO TABS
150.0000 mg | ORAL_TABLET | Freq: Every morning | ORAL | Status: DC
Start: 2016-03-16 — End: 2016-03-16
  Filled 2016-03-15: qty 1

## 2016-03-15 MED ORDER — METHOCARBAMOL 1000 MG/10ML IJ SOLN
500.0000 mg | Freq: Four times a day (QID) | INTRAVENOUS | Status: DC | PRN
  Administered 2016-03-15: 500 mg via INTRAVENOUS
  Filled 2016-03-15 (×2): qty 5

## 2016-03-15 MED ORDER — EPHEDRINE SULFATE 50 MG/ML IJ SOLN
INTRAMUSCULAR | Status: AC
  Filled 2016-03-15: qty 1

## 2016-03-15 MED ORDER — MENTHOL 3 MG MT LOZG: 1.0000 | LOZENGE | OROMUCOSAL | Status: DC | PRN

## 2016-03-15 MED ORDER — LIDOCAINE HCL (CARDIAC) 20 MG/ML IV SOLN
INTRAVENOUS | Status: DC | PRN
  Administered 2016-03-15: 60 mg via INTRATRACHEAL

## 2016-03-15 MED ORDER — MOMETASONE FURO-FORMOTEROL FUM 200-5 MCG/ACT IN AERO
2.0000 | INHALATION_SPRAY | Freq: Two times a day (BID) | RESPIRATORY_TRACT | Status: DC
  Administered 2016-03-15 – 2016-03-16 (×2): 2 via RESPIRATORY_TRACT
  Filled 2016-03-15: qty 8.8

## 2016-03-15 MED ORDER — CEFAZOLIN SODIUM-DEXTROSE 2-4 GM/100ML-% IV SOLN
INTRAVENOUS | Status: AC
  Filled 2016-03-15: qty 100

## 2016-03-15 MED ORDER — METOCLOPRAMIDE HCL 10 MG PO TABS: 5.0000 mg | ORAL_TABLET | Freq: Three times a day (TID) | ORAL | Status: DC | PRN

## 2016-03-15 MED ORDER — LACTATED RINGERS IV SOLN
INTRAVENOUS | Status: DC
  Administered 2016-03-15: 1000 mL via INTRAVENOUS
  Administered 2016-03-15: 16:00:00 via INTRAVENOUS

## 2016-03-15 MED ORDER — CELECOXIB 200 MG PO CAPS
200.0000 mg | ORAL_CAPSULE | Freq: Two times a day (BID) | ORAL | Status: DC
  Administered 2016-03-15 – 2016-03-16 (×2): 200 mg via ORAL
  Filled 2016-03-15 (×3): qty 1

## 2016-03-15 MED ORDER — KETOROLAC TROMETHAMINE 15 MG/ML IJ SOLN
INTRAMUSCULAR | Status: DC | PRN
  Administered 2016-03-15: 15 mg via INTRAVENOUS

## 2016-03-15 MED ORDER — WARFARIN - PHARMACIST DOSING INPATIENT: Freq: Every day | Status: DC

## 2016-03-15 MED ORDER — SODIUM CHLORIDE 0.9 % IV SOLN
100.0000 mL/h | INTRAVENOUS | Status: DC
  Administered 2016-03-15: 100 mL/h via INTRAVENOUS
  Filled 2016-03-15 (×4): qty 1000

## 2016-03-15 MED ORDER — MONTELUKAST SODIUM 10 MG PO TABS
10.0000 mg | ORAL_TABLET | Freq: Every morning | ORAL | Status: DC
  Administered 2016-03-16: 10 mg via ORAL
  Filled 2016-03-15: qty 1

## 2016-03-15 MED ORDER — ALBUTEROL SULFATE (2.5 MG/3ML) 0.083% IN NEBU: 2.5000 mg | INHALATION_SOLUTION | Freq: Four times a day (QID) | RESPIRATORY_TRACT | Status: DC | PRN

## 2016-03-15 MED ORDER — DEXAMETHASONE SODIUM PHOSPHATE 10 MG/ML IJ SOLN
10.0000 mg | Freq: Once | INTRAMUSCULAR | Status: AC
  Administered 2016-03-15: 10 mg via INTRAVENOUS

## 2016-03-15 MED ORDER — EPHEDRINE SULFATE 50 MG/ML IJ SOLN
INTRAMUSCULAR | Status: DC | PRN
  Administered 2016-03-15 (×2): 10 mg via INTRAVENOUS
  Administered 2016-03-15 (×2): 15 mg via INTRAVENOUS

## 2016-03-15 MED ORDER — NYSTATIN 100000 UNIT/ML MT SUSP
5.0000 mL | Freq: Two times a day (BID) | OROMUCOSAL | Status: DC
  Administered 2016-03-15 – 2016-03-16 (×2): 500000 [IU] via ORAL
  Filled 2016-03-15 (×3): qty 5

## 2016-03-15 MED ORDER — 0.9 % SODIUM CHLORIDE (POUR BTL) OPTIME
TOPICAL | Status: DC | PRN
  Administered 2016-03-15: 1000 mL

## 2016-03-15 MED ORDER — ONDANSETRON HCL 4 MG/2ML IJ SOLN
INTRAMUSCULAR | Status: DC | PRN
  Administered 2016-03-15: 4 mg via INTRAVENOUS

## 2016-03-15 MED ORDER — METOCLOPRAMIDE HCL 5 MG/ML IJ SOLN: 5.0000 mg | Freq: Three times a day (TID) | INTRAMUSCULAR | Status: DC | PRN

## 2016-03-15 MED ORDER — HYDROMORPHONE HCL 1 MG/ML IJ SOLN
0.2500 mg | INTRAMUSCULAR | Status: DC | PRN
  Administered 2016-03-15 (×4): 0.5 mg via INTRAVENOUS

## 2016-03-15 MED ORDER — ACETAMINOPHEN 10 MG/ML IV SOLN
INTRAVENOUS | Status: AC
  Filled 2016-03-15: qty 100

## 2016-03-15 MED ORDER — KETOROLAC TROMETHAMINE 30 MG/ML IJ SOLN
INTRAMUSCULAR | Status: AC
  Filled 2016-03-15: qty 1

## 2016-03-15 MED ORDER — BISACODYL 10 MG RE SUPP: 10.0000 mg | Freq: Every day | RECTAL | Status: DC | PRN

## 2016-03-15 MED ORDER — HYDROMORPHONE HCL 1 MG/ML IJ SOLN: 0.5000 mg | INTRAMUSCULAR | Status: DC | PRN

## 2016-03-15 MED ORDER — PHENOL 1.4 % MT LIQD: 1.0000 | OROMUCOSAL | Status: DC | PRN

## 2016-03-15 MED ORDER — ONDANSETRON HCL 4 MG/2ML IJ SOLN: 4.0000 mg | Freq: Four times a day (QID) | INTRAMUSCULAR | Status: DC | PRN

## 2016-03-15 MED ORDER — METHOCARBAMOL 500 MG PO TABS
500.0000 mg | ORAL_TABLET | Freq: Four times a day (QID) | ORAL | Status: DC | PRN
  Filled 2016-03-15 (×2): qty 1

## 2016-03-15 MED ORDER — SUCCINYLCHOLINE CHLORIDE 20 MG/ML IJ SOLN
INTRAMUSCULAR | Status: DC | PRN
  Administered 2016-03-15: 100 mg via INTRAVENOUS

## 2016-03-15 MED ORDER — AZELASTINE HCL 0.1 % NA SOLN
1.0000 | Freq: Two times a day (BID) | NASAL | Status: DC
  Administered 2016-03-15 – 2016-03-16 (×2): 1 via NASAL
  Filled 2016-03-15: qty 30

## 2016-03-15 MED ORDER — BENZONATATE 100 MG PO CAPS: 100.0000 mg | ORAL_CAPSULE | Freq: Three times a day (TID) | ORAL | Status: DC | PRN

## 2016-03-15 MED ORDER — FENTANYL CITRATE (PF) 250 MCG/5ML IJ SOLN
INTRAMUSCULAR | Status: DC | PRN
  Administered 2016-03-15 (×2): 50 ug via INTRAVENOUS

## 2016-03-15 MED ORDER — MIRABEGRON ER 50 MG PO TB24
50.0000 mg | ORAL_TABLET | Freq: Every day | ORAL | Status: DC
  Administered 2016-03-15: 50 mg via ORAL
  Filled 2016-03-15 (×2): qty 1

## 2016-03-15 SURGICAL SUPPLY — 43 items
BAG SPEC THK2 15X12 ZIP CLS (MISCELLANEOUS)
BAG ZIPLOCK 12X15 (MISCELLANEOUS) ×1 IMPLANT
BIT DRILL 2.0X128 (BIT) ×1 IMPLANT
BIT DRILL 2.0X128MM (BIT) ×1
DRAPE INCISE IOBAN 85X60 (DRAPES) ×2 IMPLANT
DRAPE ORTHO SPLIT 77X108 STRL (DRAPES) ×6
DRAPE SURG 17X11 SM STRL (DRAPES) ×3 IMPLANT
DRAPE SURG ORHT 6 SPLT 77X108 (DRAPES) ×1 IMPLANT
DRAPE U-SHAPE 47X51 STRL (DRAPES) ×3 IMPLANT
DRSG AQUACEL AG ADV 3.5X10 (GAUZE/BANDAGES/DRESSINGS) ×2 IMPLANT
DRSG PAD ABDOMINAL 8X10 ST (GAUZE/BANDAGES/DRESSINGS) ×3 IMPLANT
DURAPREP 26ML APPLICATOR (WOUND CARE) ×3 IMPLANT
ELECT REM PT RETURN 9FT ADLT (ELECTROSURGICAL) ×3
ELECTRODE REM PT RTRN 9FT ADLT (ELECTROSURGICAL) ×1 IMPLANT
FACESHIELD WRAPAROUND (MASK) ×3 IMPLANT
FACESHIELD WRAPAROUND OR TEAM (MASK) IMPLANT
GLOVE BIOGEL M 7.0 STRL (GLOVE) ×2 IMPLANT
GLOVE BIOGEL PI IND STRL 7.5 (GLOVE) ×1 IMPLANT
GLOVE BIOGEL PI INDICATOR 7.5 (GLOVE) ×4
GLOVE ORTHO TXT STRL SZ7.5 (GLOVE) ×3 IMPLANT
GOWN STRL REUS W/TWL LRG LVL3 (GOWN DISPOSABLE) ×3 IMPLANT
GOWN STRL REUS W/TWL XL LVL3 (GOWN DISPOSABLE) ×4 IMPLANT
KIT BASIN OR (CUSTOM PROCEDURE TRAY) ×3 IMPLANT
LIQUID BAND (GAUZE/BANDAGES/DRESSINGS) ×2 IMPLANT
MANIFOLD NEPTUNE II (INSTRUMENTS) ×3 IMPLANT
NDL MA TROC 1/2 (NEEDLE) IMPLANT
NDL MAYO CATGUT SZ4 TPR NDL (NEEDLE) IMPLANT
NEEDLE MA TROC 1/2 (NEEDLE) ×3 IMPLANT
NEEDLE MAYO CATGUT SZ4 (NEEDLE) ×3 IMPLANT
NS IRRIG 1000ML POUR BTL (IV SOLUTION) ×3 IMPLANT
PACK TOTAL JOINT (CUSTOM PROCEDURE TRAY) ×3 IMPLANT
PASSER SUT SWANSON 36MM LOOP (INSTRUMENTS) ×2 IMPLANT
POSITIONER SURGICAL ARM (MISCELLANEOUS) ×3 IMPLANT
STAPLER VISISTAT (STAPLE) IMPLANT
SUT ETHIBOND NAB CT1 #1 30IN (SUTURE) IMPLANT
SUT FIBERWIRE #2 38 T-5 BLUE (SUTURE) ×6
SUT MNCRL AB 4-0 PS2 18 (SUTURE) ×3 IMPLANT
SUT VIC AB 1 CT1 27 (SUTURE) ×6
SUT VIC AB 1 CT1 27XBRD ANTBC (SUTURE) ×3 IMPLANT
SUT VIC AB 2-0 CT1 27 (SUTURE) ×6
SUT VIC AB 2-0 CT1 TAPERPNT 27 (SUTURE) ×1 IMPLANT
SUTURE FIBERWR #2 38 T-5 BLUE (SUTURE) IMPLANT
TOWEL OR 17X26 10 PK STRL BLUE (TOWEL DISPOSABLE) ×8 IMPLANT

## 2016-03-15 NOTE — Progress Notes (Signed)
ANTICOAGULATION CONSULT NOTE - Initial Consult  Pharmacy Consult for Warfarin Indication: stroke  Allergies  Allergen Reactions  . Clarithromycin     REACTION: possible rash but could have been legionarres dz with rash  . Lipitor [Atorvastatin]     MUSCLE SPASMS  . Simvastatin   . Crestor [Rosuvastatin Calcium]     fagitue and joint pain, "NO STATINS"    Patient Measurements: Height: 5\' 4"  (162.6 cm) Weight: 143 lb (64.864 kg) IBW/kg (Calculated) : 54.7  Vital Signs: Temp: 97.7 F (36.5 C) (04/11 1744) Temp Source: Oral (04/11 1744) BP: 116/57 mmHg (04/11 1744) Pulse Rate: 92 (04/11 1744)  Labs:  Recent Labs  03/15/16 1245  LABPROT 13.2  INR 0.98    Estimated Creatinine Clearance: 40.9 mL/min (by C-G formula based on Cr of 1.01).   Medical History: Past Medical History  Diagnosis Date  . COPD (chronic obstructive pulmonary disease) ([place])   . GERD (gastroesophageal reflux disease)   . HLD (hyperlipidemia)   . Osteoarthritis   . Hemorrhoids   . History of cerebrovascular accident   . TIA (transient ischemic attack) last 2001    anticoagulation therapy on coumadin  . HTN (hypertension)   . DI (detrusor instability)   . Status post dilation of esophageal narrowing   . Depression 11/29/2013  . Nephrolithiasis     hx  . Pneumonia 2013  . Shingles since Nov 18, 2013    on right arm from shoulder to wrist  . Dysrhythmia   . Anxiety   . History of kidney stones   . Basal cell carcinoma   . Allergic rhinitis   . Stroke [facility]) 2001    STROKES, TIA'S  . Shortness of breath dyspnea     WITH EXERTION     Medications:  Scheduled:  . [START ON 03/16/2016] amLODipine  5 mg Oral q morning - 10a  . azelastine  1 spray Each Nare BID  .  ceFAZolin (ANCEF) IV  2 g Intravenous Q6H  . celecoxib  200 mg Oral Q12H  . [START ON 03/16/2016] citalopram  20 mg Oral Daily  . [START ON 03/16/2016] dexamethasone  10 mg Intravenous Once  . docusate sodium  100 mg Oral BID  .  [START ON 03/16/2016] enoxaparin (LOVENOX) injection  40 mg Subcutaneous Q24H  . [START ON 03/16/2016] ferrous sulfate  325 mg Oral TID PC  . fluticasone  1 spray Each Nare BID  . HYDROcodone-acetaminophen  1-2 tablet Oral Q4H  . HYDROmorphone      . HYDROmorphone      . [START ON 03/16/2016] irbesartan  150 mg Oral q morning - 10a  . mirabegron ER  50 mg Oral QHS  . mometasone-formoterol  2 puff Inhalation BID  . [START ON 03/16/2016] montelukast  10 mg Oral q morning - 10a  . nystatin  5 mL Oral BID  . omeprazole  20 mg Oral BID  . polyethylene glycol  17 g Oral BID  . [START ON 03/16/2016] tiotropium  1 capsule Inhalation Daily  . tranexamic acid  (ORTHO-IV)  1,000 mg Intravenous Once  . [START ON 03/16/2016] Warfarin - Pharmacist Dosing Inpatient   Does not apply q1800   Infusions:  . sodium chloride 0.9 % 1,000 mL with potassium chloride 10 mEq infusion      Assessment:  75 yr female with PMH including CVA, TIA, HTN, COPD presents for surgery today for repair of right gluteus medius tear  PTA patient on Warfarin 7.5mg  daily except  5mg  on Mon & Sun - last dose reportedly taken on 4/5.  Patient used Lovenox 95mg  sq q24h as bridge therapy leading up to surgery.  Last dose of Lovenox taken on 4/10 @ 0900.  Post-op pharmacy consulted to dose patient's warfarin.  MD has also ordered Lovenox 40mg  sq q24h to begin on 4/12.   Goal of Therapy:  INR 2-3   Plan:   Warfarin 7.5 mg po x 1 tonight  Check daily PT/INR  Jinx Gilden, Toribio Harbour, PharmD 03/15/2016,6:37 PM

## 2016-03-15 NOTE — Anesthesia Preprocedure Evaluation (Addendum)
Anesthesia Evaluation  Patient identified by MRN, date of birth, ID band Patient awake    Reviewed: Allergy & Precautions, NPO status , Patient's Chart, lab work & pertinent test results  Airway Mallampati: II   Neck ROM: Full    Dental  (+) Edentulous Upper, Dental Advisory Given, Partial Lower   Pulmonary shortness of breath, COPD, former smoker,    breath sounds clear to auscultation       Cardiovascular hypertension, + dysrhythmias  Rhythm:Regular Rate:Normal     Neuro/Psych TIACVA    GI/Hepatic Neg liver ROS, GERD  ,  Endo/Other  negative endocrine ROS  Renal/GU Renal disease     Musculoskeletal  (+) Arthritis ,   Abdominal   Peds  Hematology negative hematology ROS (+)   Anesthesia Other Findings   Reproductive/Obstetrics                            Anesthesia Physical  Anesthesia Plan  ASA: III  Anesthesia Plan: General   Post-op Pain Management:    Induction: Intravenous  Airway Management Planned: Oral ETT  Additional Equipment: Arterial line  Intra-op Plan:   Post-operative Plan: Possible Post-op intubation/ventilation  Informed Consent: I have reviewed the patients History and Physical, chart, labs and discussed the procedure including the risks, benefits and alternatives for the proposed anesthesia with the patient or authorized representative who has indicated his/her understanding and acceptance.   Dental advisory given  Plan Discussed with: CRNA, Surgeon and Anesthesiologist  Anesthesia Plan Comments:        Anesthesia Quick Evaluation

## 2016-03-15 NOTE — Interval H&P Note (Signed)
History and Physical Interval Note:  03/15/2016 1:59 PM  [name]  has presented today for surgery, with the diagnosis of right gluteus medius tear  The various methods of treatment have been discussed with the patient and family. After consideration of risks, benefits and other options for treatment, the patient has consented to  Procedure(s): RIGHT GLUTEUS MEDIUS REPAIR (Right) as a surgical intervention .  The patient's history has been reviewed, patient examined, no change in status, stable for surgery.  I have reviewed the patient's chart and labs.  Questions were answered to the patient's satisfaction.     [name]

## 2016-03-15 NOTE — Transfer of Care (Signed)
Immediate Anesthesia Transfer of Care Note  Patient: [name]  Procedure(s) Performed: Procedure(s): RIGHT HIP GLUTEUS MEDIUS TENDON REPAIR (Right)  Patient Location: PACU  Anesthesia Type:General  Level of Consciousness: awake  Airway & Oxygen Therapy: Patient Spontanous Breathing and Patient connected to face mask oxygen  Post-op Assessment: Report given to RN and Post -op Vital signs reviewed and stable  Post vital signs: Reviewed and stable  Last Vitals:  Filed Vitals:   03/15/16 1227  BP: 160/65  Pulse: 82  Temp: 36.6 C  Resp: 18    Complications: No apparent anesthesia complications

## 2016-03-15 NOTE — Anesthesia Procedure Notes (Signed)
Procedure Name: Intubation Date/Time: 03/15/2016 2:48 PM Performed by: Dione Booze Pre-anesthesia Checklist: Emergency Drugs available, Patient being monitored, Suction available and Patient identified Patient Re-evaluated:Patient Re-evaluated prior to inductionOxygen Delivery Method: Circle system utilized Preoxygenation: Pre-oxygenation with 100% oxygen Intubation Type: IV induction Laryngoscope Size: Mac and 4 Grade View: Grade I Tube type: Subglottic suction tube Tube size: 7.5 mm Number of attempts: 1 Airway Equipment and Method: Stylet Placement Confirmation: ETT inserted through vocal cords under direct vision,  positive ETCO2 and breath sounds checked- equal and bilateral Secured at: 22 cm Tube secured with: Tape Dental Injury: Teeth and Oropharynx as per pre-operative assessment

## 2016-03-15 NOTE — Anesthesia Postprocedure Evaluation (Signed)
Anesthesia Post Note  Patient: [name]  Procedure(s) Performed: Procedure(s) (LRB): RIGHT HIP GLUTEUS MEDIUS TENDON REPAIR (Right)  Patient location during evaluation: PACU Anesthesia Type: General Level of consciousness: sedated Pain management: pain level controlled Vital Signs Assessment: post-procedure vital signs reviewed and stable Respiratory status: spontaneous breathing and respiratory function stable Cardiovascular status: stable Anesthetic complications: no    Last Vitals:  Filed Vitals:   03/15/16 1655 03/15/16 1700  BP:  124/65  Pulse: 84 82  Temp:    Resp: 14 13    Last Pain:  Filed Vitals:   03/15/16 1713  PainSc: 8                  Tequilla Cousineau DANIEL

## 2016-03-15 NOTE — Brief Op Note (Signed)
03/15/2016  8:29 PM  PATIENT:  [name]  75 y.o. female  PRE-OPERATIVE DIAGNOSIS:  right hip gluteus medius tear  POST-OPERATIVE DIAGNOSIS:  right hip gluteus medius tear  PROCEDURE:  Procedure(s): RIGHT HIP GLUTEUS MEDIUS TENDON REPAIR (Right)  SURGEON:  Surgeon(s) and Role:    * Paralee Cancel, MD - Primary  PHYSICIAN ASSISTANT: Nehemiah Massed, PA-C  ANESTHESIA:   general  EBL:   100cc  BLOOD ADMINISTERED:none  DRAINS: none   LOCAL MEDICATIONS USED:  NONE  SPECIMEN:  No Specimen  DISPOSITION OF SPECIMEN:  N/A  COUNTS:  YES  TOURNIQUET:  * No tourniquets in log *  DICTATION: .Other Dictation: Dictation Number [id number]  PLAN OF CARE: Admit for overnight observation  PATIENT DISPOSITION:  PACU - hemodynamically stable.   Delay start of Pharmacological VTE agent (>24hrs) due to surgical blood loss or risk of bleeding: no

## 2016-03-16 ENCOUNTER — Encounter (HOSPITAL_COMMUNITY): Payer: Self-pay | Admitting: Orthopedic Surgery

## 2016-03-16 ENCOUNTER — Telehealth: Payer: Self-pay | Admitting: General Practice

## 2016-03-16 DIAGNOSIS — M1990 Unspecified osteoarthritis, unspecified site: Secondary | ICD-10-CM | POA: Diagnosis not present

## 2016-03-16 DIAGNOSIS — I10 Essential (primary) hypertension: Secondary | ICD-10-CM | POA: Diagnosis not present

## 2016-03-16 DIAGNOSIS — S76011A Strain of muscle, fascia and tendon of right hip, initial encounter: Secondary | ICD-10-CM | POA: Diagnosis not present

## 2016-03-16 DIAGNOSIS — Z8673 Personal history of transient ischemic attack (TIA), and cerebral infarction without residual deficits: Secondary | ICD-10-CM | POA: Diagnosis not present

## 2016-03-16 DIAGNOSIS — Z87891 Personal history of nicotine dependence: Secondary | ICD-10-CM | POA: Diagnosis not present

## 2016-03-16 DIAGNOSIS — J449 Chronic obstructive pulmonary disease, unspecified: Secondary | ICD-10-CM | POA: Diagnosis not present

## 2016-03-16 LAB — BASIC METABOLIC PANEL
Anion gap: 6 (ref 5–15)
BUN: 27 mg/dL — ABNORMAL HIGH (ref 6–20)
CO2: 26 mmol/L (ref 22–32)
Calcium: 8.8 mg/dL — ABNORMAL LOW (ref 8.9–10.3)
Chloride: 106 mmol/L (ref 101–111)
Creatinine, Ser: 0.97 mg/dL (ref 0.44–1.00)
GFR calc Af Amer: >60 mL/min (ref >60)
GFR calc non Af Amer: 55 mL/min — ABNORMAL LOW (ref >60)
Glucose, Bld: 134 mg/dL — ABNORMAL HIGH (ref 65–99)
Potassium: 4.6 mmol/L (ref 3.5–5.1)
Sodium: 138 mmol/L (ref 135–145)

## 2016-03-16 LAB — PROTIME-INR
INR: 0.98 (ref 0.00–1.49)
Prothrombin Time: 13.2 seconds (ref 11.6–15.2)

## 2016-03-16 LAB — CBC
HCT: 33 % — ABNORMAL LOW (ref 36.0–46.0)
Hemoglobin: 11 g/dL — ABNORMAL LOW (ref 12.0–15.0)
MCH: 27.2 pg (ref 26.0–34.0)
MCHC: 33.3 g/dL (ref 30.0–36.0)
MCV: 81.7 fL (ref 78.0–100.0)
Platelets: 130 10*3/uL — ABNORMAL LOW (ref 150–400)
RBC: 4.04 MIL/uL (ref 3.87–5.11)
RDW: 15.9 % — ABNORMAL HIGH (ref 11.5–15.5)
WBC: 5.5 10*3/uL (ref 4.0–10.5)

## 2016-03-16 MED ORDER — ENOXAPARIN SODIUM 40 MG/0.4ML ~~LOC~~ SOLN: 40.0000 mg | SUBCUTANEOUS | Status: DC

## 2016-03-16 MED ORDER — WARFARIN SODIUM 5 MG PO TABS: ORAL_TABLET | ORAL | Status: DC

## 2016-03-16 MED ORDER — FERROUS SULFATE 325 (65 FE) MG PO TABS: 325.0000 mg | ORAL_TABLET | Freq: Three times a day (TID) | ORAL | Status: DC

## 2016-03-16 MED ORDER — DOCUSATE SODIUM 100 MG PO CAPS: 100.0000 mg | ORAL_CAPSULE | Freq: Two times a day (BID) | ORAL | Status: DC

## 2016-03-16 MED ORDER — POLYETHYLENE GLYCOL 3350 17 G PO PACK: 17.0000 g | PACK | Freq: Two times a day (BID) | ORAL | Status: DC

## 2016-03-16 MED ORDER — WARFARIN SODIUM 7.5 MG PO TABS
7.5000 mg | ORAL_TABLET | Freq: Once | ORAL | Status: DC
  Filled 2016-03-16: qty 1

## 2016-03-16 MED ORDER — HYDROCODONE-ACETAMINOPHEN 7.5-325 MG PO TABS: 1.0000 | ORAL_TABLET | ORAL | Status: DC | PRN

## 2016-03-16 NOTE — Telephone Encounter (Signed)
Incoming call from [place], [place] @ [place] needing dosing instructions for patient's coumadin.  Pt is being released today to go home.  I spoke with patient and gave her the following instructions.   4/12 - Lovenox and coumadin, 10 mg 4/13 - Lovenox and coumadin, 10 mg 4/14 - Lovenox and coumadin, 7.5 mg 4/15 - Lovenox and coumadin, 7.5 mg 4/16 - Lovenox and coumadin, 7.5 mg 4/17 - Check INR @ [facility].  Pt verbalized instructions.

## 2016-03-16 NOTE — Op Note (Signed)
[name], [name] NO.:  [id number]  MEDICAL RECORD NO.:  [id number]  LOCATION:  [id number]                         FACILITY:  [facility]  PHYSICIAN:  Lauren Cassis. Alvan Ray, M.D.  DATE OF BIRTH:  [birth year]  DATE OF PROCEDURE:  03/15/2016 DATE OF DISCHARGE:                              OPERATIVE REPORT   PREOPERATIVE DIAGNOSIS:  Right hip gluteus medius tear with recalcitrant pain.  POSTOPERATIVE DIAGNOSIS:  Right hip gluteus medius tear with recalcitrant pain.  PROCEDURE: 1. Open repair of right gluteus medius tendon. 2. Ostectomy of bony prominence over the lateral trochanter. 3. Right hip bursectomy.  SURGEON:  Lauren Cassis. Alvan Ray, M.D.  ASSISTANT:  Lauren Massed, PA-C.  Note that [name] was present for the entirety of the case from preoperative position, perioperative management of operative extremity, general facilitation of the case and primary wound closure.  ANESTHESIA:  General.  SPECIMENS:  None.  COMPLICATION:  None.  DRAINS:  None.  BLOOD LOSS:  Minimal.  INDICATIONS FOR PROCEDURE:  [name] is a 75 year old female, who has been seen and evaluated in the office for persistent recurring lateral right hip pain.  After failing to respond to conservative measures and long period of time, we ultimately discussed the MRI evaluation of the hip.  The MRI of the hip revealed concerns for gluteus medius tendon tearing with no significant atrophy.  Given these findings and the persistence of pain, we discussed the limitations of operative fixation of the gluteus medius tendon, but she at this point, wished to proceed in this fashion due to the recalcitrant nature of the pain.  The risks of infection discussed, risks of failure of repair, risks of potential need for further surgery as well as persistent pain discussed and reviewed, consent was obtained for benefit of attempted pain relief.  PROCEDURE IN DETAIL:  The patient was brought to operative  theater. Once adequate anesthesia, preoperative antibiotics, Ancef administered, she was positioned into the left lateral decubitus position with the right hip up.  The right lower extremity was then prepped in sterile fashion.  The time-out was performed identifying the patient, planned procedure, and extremity.  At this point, I made a longitudinal based incision, based off the trochanter anteriorly.  This was extended into the area of the gluteus maximus.  About 50/50 of the incision was over the bone versus soft tissue.  50% was over the soft tissues versus bone.  Soft tissue dissection was carried down to the fascia.  The gluteus tensor fascia as well as the iliotibial band.  I then incised this longitudinally as well thus exposing the anterior aspect of the trochanter.  We identified a very knobby lateral trochanter which was debrided off any prominent bone with the use of a rongeur back to a smooth border.  Anteriorly identified the bursa tissue, which was debrided around circumferentially around the bursa in the trochanteric region.  At this point, I identified an attenuated gluteus tendon from the superior aspect of the trochanter down to inferior insertion.  I did stimulate the tendon and muscle and identified there was still some contractility.  At this point, passed two #2 FiberWire sutures in a locking  pattern into the tendon itself.  This left me 6 suture ends.  I then drilled 4 drill holes into the lateral trochanter and through using a large free needle, I passed the sutures, single in the proximal and distal holes and combined sutures through the other 2.  Once this was done with assistance of my team in the OR, the tendon was retracted back to bone with the leg slightly internally rotated.  There were 4 strands of sutures used to pull tension.  The other suture was tied together and then the other 2 were in similar fashion.  Once this was done, I tied the  remaining sutures themselves to provide extra fixation and stability.  At this point, there was appeared to be a watertight seal this gluteal tendon back to bone.  The wound was irrigated.  At this point, I reapproximated the iliotibial band, gluteal, and tensor fascia using #1 Vicryl.  I did release in a star-type pattern over the area of the suture over the lateral aspect of the trochanter not to take some of the tension off this area to prevent further at least acute postoperative agitation.  The remaining wound was closed with 2-0 Vicryl and a running 4-0 Monocryl.  The hip was then cleaned, dried, and dressed sterilely using surgical glue and Aquacel dressing.  She was brought to the recovery room in stable condition tolerating the procedure well.  Findings were reviewed with her husband. She will be weightbearing as tolerated using a walker for 6-8 weeks.  We will limit active abduction activities.     [name], M.D.     MDO/MEDQ  D:  03/15/2016  T:  03/16/2016  Job:  [id number]

## 2016-03-16 NOTE — Evaluation (Signed)
Physical Therapy Evaluation Patient Details Name: [name] MRN: [id number] DOB: Jan 13, 1941 Today's Date: 03/16/2016   History of Present Illness  RIGHT HIP GLUTEUS MEDIUS TENDON REPAIR (Right)   Clinical Impression  Pt s/p glut tendon repair presents with decreased R LE strength/ROM and post op pain limiting functional mobility.  Pt should progress to dc home with family assist and would benefit from follow up [place].    Follow Up Recommendations Home health PT    Equipment Recommendations  None recommended by PT    Recommendations for Other Services OT consult     Precautions / Restrictions Precautions Precautions: Fall Precaution Comments: NO ACTIVE ABDUCTION Restrictions Weight Bearing Restrictions: No Other Position/Activity Restrictions: WBAT      Mobility  Bed Mobility Overal bed mobility: Needs Assistance Bed Mobility: Supine to Sit     Supine to sit: Min assist     General bed mobility comments: cues for sequence and use of L LE to self assist  Transfers Overall transfer level: Needs assistance Equipment used: Rolling walker (2 wheeled) Transfers: Sit to/from Stand Sit to Stand: Min assist         General transfer comment: cues for LE management and use of UEs to self assist  Ambulation/Gait Ambulation/Gait assistance: Min assist;Min guard Ambulation Distance (Feet): 117 Feet Assistive device: Rolling walker (2 wheeled) Gait Pattern/deviations: Step-to pattern;Decreased step length - right;Decreased step length - left;Shuffle;Trunk flexed Gait velocity: decr Gait velocity interpretation: Below normal speed for age/gender General Gait Details: cues for sequence, posture and position from [employer]            Wheelchair Mobility    Modified Rankin (Stroke Patients Only)       Balance                                             Pertinent Vitals/Pain Pain Assessment: 0-10 Pain Score: 2  Pain Location: R  LE Pain Descriptors / Indicators: Aching;Sore Pain Intervention(s): Limited activity within patient's tolerance;Monitored during session;Premedicated before session;Ice applied    Home Living Family/patient expects to be discharged to:: Private residence Living Arrangements: Spouse/significant other Available Help at Discharge: Family;Available 24 hours/day Type of Home: House Home Access: Stairs to enter Entrance Stairs-Rails: Right Entrance Stairs-Number of Steps: 4 Home Layout: One level Home Equipment: Walker - 2 wheels;Shower seat - built in;Adaptive equipment;Cane - single point      Prior Function Level of Independence: Independent         Comments: limited ambulation due to pain     Hand Dominance   Dominant Hand: Right    Extremity/Trunk Assessment   Upper Extremity Assessment: Overall WFL for tasks assessed           Lower Extremity Assessment: RLE deficits/detail RLE Deficits / Details: Strength 2+/5 with no active ABD tested    Cervical / Trunk Assessment: Kyphotic  Communication   Communication: No difficulties  Cognition Arousal/Alertness: Awake/alert Behavior During Therapy: WFL for tasks assessed/performed Overall Cognitive Status: Within Functional Limits for tasks assessed                      General Comments      Exercises General Exercises - Lower Extremity Ankle Circles/Pumps: AROM;Both;15 reps;Supine Quad Sets: AROM;Both;10 reps;Supine Heel Slides: AAROM;Right;15 reps;Supine      Assessment/Plan    PT Assessment Patient  needs continued PT services  PT Diagnosis Difficulty walking   PT Problem List Decreased strength;Decreased range of motion;Decreased activity tolerance;Decreased balance;Decreased mobility;Decreased knowledge of use of DME;Decreased knowledge of precautions;Pain  PT Treatment Interventions DME instruction;Gait training;Stair training;Functional mobility training;Therapeutic activities;Therapeutic  exercise;Patient/family education   PT Goals (Current goals can be found in the Care Plan section) Acute Rehab PT Goals Patient Stated Goal: home today PT Goal Formulation: With patient Time For Goal Achievement: 03/19/16 Potential to Achieve Goals: Good    Frequency 7X/week   Barriers to discharge        Co-evaluation               End of Session Equipment Utilized During Treatment: Gait belt Activity Tolerance: Patient tolerated treatment well Patient left: Other (comment) (Standing with OT) Nurse Communication: Mobility status    Functional Assessment Tool Used: Clinical judgement Functional Limitation: Mobility: Walking and moving around Mobility: Walking and Moving Around Current Status [id number]): At least 1 percent but less than 20 percent impaired, limited or restricted Mobility: Walking and Moving Around Goal Status [phone]): At least 1 percent but less than 20 percent impaired, limited or restricted    Time: 0842-0906 PT Time Calculation (min) (ACUTE ONLY): 24 min   Charges:   PT Evaluation $PT Eval Low Complexity: 1 Procedure PT Treatments $Gait Training: 8-22 mins   PT G Codes:   PT G-Codes **NOT FOR INPATIENT CLASS** Functional Assessment Tool Used: Clinical judgement Functional Limitation: Mobility: Walking and moving around Mobility: Walking and Moving Around Current Status [id number]): At least 1 percent but less than 20 percent impaired, limited or restricted Mobility: Walking and Moving Around Goal Status [phone]): At least 1 percent but less than 20 percent impaired, limited or restricted    [facility] 03/16/2016, 12:56 PM

## 2016-03-16 NOTE — Progress Notes (Signed)
Occupational Therapy Evaluation Patient Details Name: [name] MRN: [id number] DOB: 02/28/1941 Today's Date: 03/16/2016    History of Present Illness RIGHT HIP GLUTEUS MEDIUS TENDON REPAIR (Right)    Clinical Impression   All OT education completed and pt questions answered. No further OT needs. OT will sign off.    Follow Up Recommendations  No OT follow up;Supervision - Intermittent    Equipment Recommendations  None recommended by OT    Recommendations for Other Services       Precautions / Restrictions Precautions Precautions: Fall Precaution Comments: NO ACTIVE ABDUCTION Restrictions Weight Bearing Restrictions: No Other Position/Activity Restrictions: WBAT      Mobility Bed Mobility               General bed mobility comments: NT -- OOB with PT  Transfers Overall transfer level: Needs assistance Equipment used: Rolling walker (2 wheeled) Transfers: Sit to/from Stand Sit to Stand: Supervision              Balance                                            ADL Overall ADL's : Needs assistance/impaired Eating/Feeding: Independent   Grooming: Wash/dry hands;Supervision/safety;Standing           Upper Body Dressing : Set up;Sitting   Lower Body Dressing: Minimal assistance;Sit to/from stand   Toilet Transfer: Supervision/safety;Ambulation;Regular Toilet;BSC;RW   Toileting- Clothing Manipulation and Hygiene: Supervision/safety;Sit to/from stand       Functional mobility during ADLs: Supervision/safety;Rolling walker General ADL Comments: Patient up with PT. Took over at end of their session and assisted patient to bathroom for toileting task, grooming at sink. Returned to recliner and patient got dressed in [place] top and bottom. 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 She states she will either do that or have husband assist her. Patient denied need to review walk-in  shower transfer.     Vision     Perception     Praxis      Pertinent Vitals/Pain Pain Assessment: 0-10 Pain Score: 2  Pain Location: R leg Pain Descriptors / Indicators: Sore Pain Intervention(s): Monitored during session;Repositioned;Ice applied     Hand Dominance Right   Extremity/Trunk Assessment Upper Extremity Assessment Upper Extremity Assessment: Overall WFL for tasks assessed   Lower Extremity Assessment Lower Extremity Assessment: Defer to PT evaluation       Communication Communication Communication: No difficulties   Cognition Arousal/Alertness: Awake/alert Behavior During Therapy: WFL for tasks assessed/performed Overall Cognitive Status: Within Functional Limits for tasks assessed                     General Comments       Exercises       Shoulder Instructions      Home Living Family/patient expects to be discharged to:: Private residence Living Arrangements: Spouse/significant other Available Help at Discharge: Family;Available 24 hours/day Type of Home: House Home Access: Stairs to enter [employer] of Steps: 4 Entrance Stairs-Rails: Right Home Layout: One level     Bathroom Shower/Tub: Walk-in Hydrologist: Handicapped height Bathroom Accessibility: Yes How Accessible: Accessible via walker Home Equipment: [place] - 2 wheels;Shower seat - built Hotel manager: Reacher        Prior Functioning/Environment Level of Independence: Independent  OT Diagnosis: Acute pain   OT Problem List: Decreased strength;Decreased activity tolerance;Decreased knowledge of precautions;Pain   OT Treatment/Interventions:      OT Goals(Current goals can be found in the care plan section) Acute Rehab OT Goals Patient Stated Goal: home today OT Goal Formulation: All assessment and education complete, DC therapy  OT Frequency:     Barriers to D/C:             Co-evaluation              End of Session Equipment Utilized During Treatment: Rolling walker Nurse Communication: Mobility status  Activity Tolerance: Patient tolerated treatment well Patient left: in chair;with call bell/phone within reach;with chair alarm set;with family/visitor present   Time: [id number] OT Time Calculation (min): 18 min Charges:  OT General Charges $OT Visit: 1 Procedure OT Evaluation $OT Eval Low Complexity: 1 Procedure G-Codes: OT G-codes **NOT FOR INPATIENT CLASS** Functional Assessment Tool Used: clinical judgment Functional Limitation: Self care Self Care Current Status [id number]): At least 20 percent but less than 40 percent impaired, limited or restricted Self Care Goal Status [id number]): At least 20 percent but less than 40 percent impaired, limited or restricted Self Care Discharge Status [phone]): At least 20 percent but less than 40 percent impaired, limited or restricted  Shaarav Ripple A 03/16/2016, 12:04 PM

## 2016-03-16 NOTE — Progress Notes (Signed)
CM notes pt to have no HHPT recommended.  Pt had order for INR but no home health agency will render this singular service.  RN aware and has made othr arrangements for coumadin dosing.  No othe CM needs were communicated.

## 2016-03-16 NOTE — Progress Notes (Signed)
Physical Therapy Treatment Patient Details Name: [name] MRN: [id number] DOB: 08-03-1941 Today's Date: 03/16/2016    History of Present Illness RIGHT HIP GLUTEUS MEDIUS TENDON REPAIR (Right)     PT Comments    Pt progressing well with mobility and with good understanding of NO ACTIVE ABD restriction.  Reviewed stairs and gentle therex with pt and spouse.  Follow Up Recommendations  No PT follow up     Equipment Recommendations  None recommended by PT    Recommendations for Other Services OT consult     Precautions / Restrictions Precautions Precautions: Fall Precaution Comments: NO ACTIVE ABDUCTION Restrictions Weight Bearing Restrictions: No Other Position/Activity Restrictions: WBAT    Mobility  Bed Mobility Overal bed mobility: Needs Assistance Bed Mobility: Supine to Sit     Supine to sit: Min guard     General bed mobility comments: cues for sequence and use of L LE to self assist  Transfers Overall transfer level: Needs assistance Equipment used: Rolling walker (2 wheeled) Transfers: Sit to/from Stand Sit to Stand: Supervision         General transfer comment: cues for LE management and use of UEs to self assist  Ambulation/Gait Ambulation/Gait assistance: Min guard;Supervision Ambulation Distance (Feet): 65 Feet Assistive device: Rolling walker (2 wheeled) Gait Pattern/deviations: Step-to pattern;Decreased step length - right;Decreased step length - left;Shuffle;Trunk flexed Gait velocity: decr Gait velocity interpretation: Below normal speed for age/gender General Gait Details: min cues for sequence, posture and position from RW   Stairs Stairs: Yes Stairs assistance: Min assist Stair Management: One rail Right;Step to pattern;Forwards;With cane Number of Stairs: 5 General stair comments: cues for sequence and foot/crutch placement  Wheelchair Mobility    Modified Rankin (Stroke Patients Only)       Balance                                     Cognition Arousal/Alertness: Awake/alert Behavior During Therapy: WFL for tasks assessed/performed Overall Cognitive Status: Within Functional Limits for tasks assessed                      Exercises General Exercises - Lower Extremity Ankle Circles/Pumps: AROM;Both;15 reps;Supine Quad Sets: AROM;Both;10 reps;Supine Long Arc Quad: AROM;Both;10 reps;Seated Heel Slides: AAROM;Right;15 reps;Supine    General Comments        Pertinent Vitals/Pain Pain Assessment: 0-10 Pain Score: 3  Pain Location: R LE Pain Descriptors / Indicators: Aching;Sore Pain Intervention(s): Limited activity within patient's tolerance;Monitored during session;Premedicated before session;Ice applied    Home Living Family/patient expects to be discharged to:: Private residence Living Arrangements: Spouse/significant other Available Help at Discharge: Family;Available 24 hours/day Type of Home: House Home Access: Stairs to enter Entrance Stairs-Rails: Right Home Layout: One level Home Equipment: Walker - 2 wheels;Shower seat - built in;Adaptive equipment;Cane - single point      Prior Function Level of Independence: Independent      Comments: limited ambulation due to pain   PT Goals (current goals can now be found in the care plan section) Acute Rehab PT Goals Patient Stated Goal: home today PT Goal Formulation: With patient Time For Goal Achievement: 03/19/16 Potential to Achieve Goals: Good Progress towards PT goals: Progressing toward goals    Frequency  7X/week    PT Plan Discharge plan needs to be updated    Co-evaluation  End of Session Equipment Utilized During Treatment: Gait belt Activity Tolerance: Patient tolerated treatment well Patient left: in chair;with call bell/phone within reach;with family/visitor present     Time: [id number] PT Time Calculation (min) (ACUTE ONLY): 36 min  Charges:  $Gait Training: 8-22  mins $Therapeutic Exercise: 8-22 mins                    G Codes:  Functional Assessment Tool Used: Clinical judgement Functional Limitation: Mobility: Walking and moving around Mobility: Walking and Moving Around Current Status [phone]): At least 1 percent but less than 20 percent impaired, limited or restricted Mobility: Walking and Moving Around Goal Status [phone]): At least 1 percent but less than 20 percent impaired, limited or restricted   [facility] 03/16/2016, 2:47 PM

## 2016-03-16 NOTE — Progress Notes (Addendum)
[facility] for Warfarin Indication: stroke  Allergies  Allergen Reactions  . Clarithromycin     REACTION: possible rash but could have been legionarres dz with rash  . Lipitor [Atorvastatin]     MUSCLE SPASMS  . Simvastatin   . Crestor [Rosuvastatin Calcium]     fagitue and joint pain, "NO STATINS"   Patient Measurements: Height: 5\' 4"  (162.6 cm) Weight: 143 lb (64.864 kg) IBW/kg (Calculated) : 54.7  Vital Signs: Temp: 98.7 F (37.1 C) (04/12 0443) Temp Source: Oral (04/12 0443) BP: 110/54 mmHg (04/12 0443) Pulse Rate: 89 (04/12 0804)  Labs:  Recent Labs  03/15/16 1245 03/15/16 1924 03/16/16 0418  HGB  --  12.7 11.0*  HCT  --  38.4 33.0*  PLT  --  138* 130*  LABPROT 13.2  --  13.2  INR 0.98  --  0.98  CREATININE  --  0.94 0.97   Estimated Creatinine Clearance: 42.6 mL/min (by C-G formula based on Cr of 0.97).  Medical History: Past Medical History  Diagnosis Date  . COPD (chronic obstructive pulmonary disease) ([place])   . GERD (gastroesophageal reflux disease)   . HLD (hyperlipidemia)   . Osteoarthritis   . Hemorrhoids   . History of cerebrovascular accident   . TIA (transient ischemic attack) last 2001    anticoagulation therapy on coumadin  . HTN (hypertension)   . DI (detrusor instability)   . Status post dilation of esophageal narrowing   . Depression 11/29/2013  . Nephrolithiasis     hx  . Pneumonia 2013  . Shingles since Nov 18, 2013    on right arm from shoulder to wrist  . Dysrhythmia   . Anxiety   . History of kidney stones   . Basal cell carcinoma   . Allergic rhinitis   . Stroke [facility]) 2001    STROKES, TIA'S  . Shortness of breath dyspnea     WITH EXERTION    Medications:  Scheduled:  . amLODipine  5 mg Oral q morning - 10a  . azelastine  1 spray Each Nare BID  . celecoxib  200 mg Oral Q12H  . citalopram  20 mg Oral Daily  . dexamethasone  10 mg Intravenous Once  . docusate sodium  100 mg Oral  BID  . enoxaparin (LOVENOX) injection  40 mg Subcutaneous Q24H  . ferrous sulfate  325 mg Oral TID PC  . fluticasone  1 spray Each Nare BID  . HYDROcodone-acetaminophen  1-2 tablet Oral Q4H  . irbesartan  150 mg Oral q morning - 10a  . mirabegron ER  50 mg Oral QHS  . mometasone-formoterol  2 puff Inhalation BID  . montelukast  10 mg Oral q morning - 10a  . nystatin  5 mL Oral BID  . omeprazole  20 mg Oral BID  . polyethylene glycol  17 g Oral BID  . tiotropium  1 capsule Inhalation Daily  . Warfarin - Pharmacist Dosing Inpatient   Does not apply q1800   Assessment:  75 yr female with PMH including CVA, TIA, HTN, COPD presents for surgery today for repair of right gluteus medius tear  PTA patient on Warfarin 7.5mg  daily except 5mg  on Mon & Sun - last dose reportedly taken on 4/5.  Patient used Lovenox 95mg  sq q24h as bridge therapy leading up to surgery.  Last dose of Lovenox taken on 4/10 @ 0900.  Post-op pharmacy consulted to dose patient's warfarin.  MD has also ordered Lovenox  40mg  sq q24h to begin on 4/12.  Today  03/16/2016 INR 0.98 CBC: post-op blood loss as expected, Plt 130 Lovenox 40mg  daily from 4/12 until INR therapeutic Regular diet: tolerating Concurrent Celebrex, for post-op pain, can increase risk of bleed   Goal of Therapy:  INR 2-3   Plan:   Warfarin 7.5 mg x 1 at 1800  Daily PT/INR  Minda Ditto PharmD Pager [phone] 03/16/2016, 8:36 AM

## 2016-03-16 NOTE — Progress Notes (Signed)
     Subjective: 1 Day Post-Op Procedure(s) (LRB): RIGHT HIP GLUTEUS MEDIUS TENDON REPAIR (Right)   Patient reports pain as mild, pain controlled. No events throughout the night.  Ready to be discharged home.  Objective:   VITALS:   Filed Vitals:   03/16/16 0443 03/16/16 0804  BP: 110/54   Pulse: 87 89  Temp: 98.7 F (37.1 C)   Resp: 16 18    Dorsiflexion/Plantar flexion intact Incision: dressing C/D/I No cellulitis present Compartment soft  LABS  Recent Labs  03/15/16 1924 03/16/16 0418  HGB 12.7 11.0*  HCT 38.4 33.0*  WBC 8.9 5.5  PLT 138* 130*     Recent Labs  03/15/16 1924 03/16/16 0418  NA  --  138  K  --  4.6  BUN  --  27*  CREATININE 0.94 0.97  GLUCOSE  --  134*     Assessment/Plan: 1 Day Post-Op Procedure(s) (LRB): RIGHT HIP GLUTEUS MEDIUS TENDON REPAIR (Right) Advance diet Up with therapy D/C IV fluids Discharge home with home health Follow up in 2 weeks at [facility]. Follow up with OLIN,Shea Swalley D in 2 weeks.  Contact information:  [facility] [address] [place] T[place]ako Jolley   PAC  03/16/2016, 9:53 AM

## 2016-03-21 ENCOUNTER — Ambulatory Visit (INDEPENDENT_AMBULATORY_CARE_PROVIDER_SITE_OTHER): Payer: Medicare Other | Admitting: General Practice

## 2016-03-21 LAB — POCT INR: INR: 2.3

## 2016-03-21 NOTE — Discharge Summary (Signed)
Physician Discharge Summary  Patient ID: [name] MRN: [id number] DOB/AGE: 01/15/1941 75 y.o.  Admit date: 03/15/2016 Discharge date: 03/16/2016   Procedures:  Procedure(s) (LRB): RIGHT HIP GLUTEUS MEDIUS TENDON REPAIR (Right)  Attending Physician:  Dr. Paralee Cancel   Admission Diagnoses:   Right gluteus medius tear   Discharge Diagnoses:  Principal Problem:   Gluteus medius tear  Past Medical History  Diagnosis Date  . COPD (chronic obstructive pulmonary disease) ([place])   . GERD (gastroesophageal reflux disease)   . HLD (hyperlipidemia)   . Osteoarthritis   . Hemorrhoids   . History of cerebrovascular accident   . TIA (transient ischemic attack) last 2001    anticoagulation therapy on coumadin  . HTN (hypertension)   . DI (detrusor instability)   . Status post dilation of esophageal narrowing   . Depression 11/29/2013  . Nephrolithiasis     hx  . Pneumonia 2013  . Shingles since Nov 18, 2013    on right arm from shoulder to wrist  . Dysrhythmia   . Anxiety   . History of kidney stones   . Basal cell carcinoma   . Allergic rhinitis   . Stroke [facility]) 2001    STROKES, TIA'S  . Shortness of breath dyspnea     WITH EXERTION     HPI:    Pt is a 75 y.o. female complaining of right gluteus pain for 2+ years. Pain had continually increased since the beginning. When the pain is at its worst it is between 8 and 9 scale of 10. Patient states the pain affects her all the time except for when she is sleeping. Patient has tried anti-inflammatories and or analgesic medications, activity modification and assistance devices. All these have not significantly helped her pain. Discussion was had with Dr. Alvan Dame regarding the outcome of surgery, patient states that Dr. Alvan Dame stated that she had about a 50% chance of surgery helping her problem. After discussion with her husband it was felt that she 50% was well worth a chance to take to relieve her pain. Risks, benefits and  expectations were discussed with the patient. Patient understand the risks, benefits and expectations and wishes to proceed with surgery.   PCP: Cathlean Cower, MD   Discharged Condition: good  Hospital Course:  Patient underwent the above stated procedure on 03/15/2016. Patient tolerated the procedure well and brought to the recovery room in good condition and subsequently to the floor.  POD #1 BP: 110/54 ; Pulse: 89 ; Temp: 98.7 F (37.1 C) ; Resp: 18 Patient reports pain as mild, pain controlled. No events throughout the night. Ready to be discharged home. Dorsiflexion/plantar flexion intact, incision: dressing C/D/I, no cellulitis present and compartment soft.   LABS  Basename    HGB     11.0  HCT     33.0    Discharge Exam: General appearance: alert, cooperative and no distress Extremities: Homans sign is negative, no sign of DVT, no edema, redness or tenderness in the calves or thighs and no ulcers, gangrene or trophic changes  Disposition: Home with follow up in 2 weeks   Follow-up Information    Follow up with [name], MD. Schedule an appointment as soon as possible for a visit in 2 weeks.   Specialty:  Orthopedic Surgery   Contact information:   [address] [place] [address] [phone]       Follow up with [facility].   Why:  Home  health agency   Contact information:   [address] [place] [address] [phone]       Discharge Instructions    Call MD / Call 911    Complete by:  As directed   If you experience chest pain or shortness of breath, CALL 911 and be transported to the hospital emergency room.  If you develope a fever above 101 F, pus (white drainage) or increased drainage or redness at the wound, or calf pain, call your surgeon's office.     Change dressing    Complete by:  As directed   Maintain surgical dressing until follow up in the clinic. If the edges start to pull up, may reinforce  with tape. If the dressing is no longer working, may remove and cover with gauze and tape, but must keep the area dry and clean.  Call with any questions or concerns.     Constipation Prevention    Complete by:  As directed   Drink plenty of fluids.  Prune juice may be helpful.  You may use a stool softener, such as Colace (over the counter) 100 mg twice a day.  Use MiraLax (over the counter) for constipation as needed.     Diet - low sodium heart healthy    Complete by:  As directed      Discharge instructions    Complete by:  As directed   Maintain surgical dressing until follow up in the clinic. If the edges start to pull up, may reinforce with tape. If the dressing is no longer working, may remove and cover with gauze and tape, but must keep the area dry and clean.  Follow up in 2 weeks at [facility]. Call with any questions or concerns.     Increase activity slowly as tolerated    Complete by:  As directed   Weight bearing as tolerated with assist device (walker, cane, etc) as directed, use it as long as suggested by your surgeon or therapist, typically at least 4-6 weeks.     TED hose    Complete by:  As directed   Use stockings (TED hose) for 2 weeks on both leg(s).  You may remove them at night for sleeping.             Medication List    STOP taking these medications        amoxicillin-clavulanate 875-125 MG tablet  Commonly known as:  AUGMENTIN     meloxicam 15 MG tablet  Commonly known as:  MOBIC      TAKE these medications        AEROCHAMBER Z-STAT PLUS inhaler  Use as instructed     albuterol 108 (90 Base) MCG/ACT inhaler  Commonly known as:  PROVENTIL HFA;VENTOLIN HFA  Inhale 1-2 puffs into the lungs every 6 (six) hours as needed for wheezing or shortness of breath.     amLODipine 5 MG tablet  Commonly known as:  NORVASC  Take 1 tablet (5 mg total) by mouth every morning.     azelastine 0.1 % nasal spray  Commonly known as:  ASTELIN  Place 2  sprays into the nose daily. Use in each nostril as directed     benzonatate 100 MG capsule  Commonly known as:  TESSALON  Take 100 mg by mouth 3 (three) times daily as needed for cough.     budesonide-formoterol 160-4.5 MCG/ACT inhaler  Commonly known as:  SYMBICORT  Inhale 2 puffs into the lungs  2 (two) times daily.     CALCIUM + D PO  Take 1 tablet by mouth 2 (two) times daily.     citalopram 20 MG tablet  Commonly known as:  CELEXA  Take 1 tablet (20 mg total) by mouth daily.     cyclobenzaprine 5 MG tablet  Commonly known as:  FLEXERIL  Take 1 tablet (5 mg total) by mouth 3 (three) times daily as needed for muscle spasms.     docusate sodium 100 MG capsule  Commonly known as:  COLACE  Take 1 capsule (100 mg total) by mouth 2 (two) times daily.     enoxaparin 40 MG/0.4ML injection  Commonly known as:  LOVENOX  Inject 0.4 mLs (40 mg total) into the skin daily.     ferrous sulfate 325 (65 FE) MG tablet  Take 1 tablet (325 mg total) by mouth 3 (three) times daily after meals.     fluticasone 50 MCG/ACT nasal spray  Commonly known as:  FLONASE  Place 1 spray into both nostrils 2 (two) times daily.     FLUTTER Devi  Use 2-3 times per day     HYDROcodone-acetaminophen 7.5-325 MG tablet  Commonly known as:  NORCO  Take 1-2 tablets by mouth every 4 (four) hours as needed for moderate pain.     irbesartan 150 MG tablet  Commonly known as:  AVAPRO  Take 1 tablet (150 mg total) by mouth every morning.     lidocaine 2 % jelly  Commonly known as:  XYLOCAINE JELLY  Apply 1 application topically as needed.     Magnesium 250 MG Tabs  Take 1 tablet by mouth daily as needed. Leg cramps     Melatonin 1 MG Tabs  Take 3 mg by mouth at bedtime.     montelukast 10 MG tablet  Commonly known as:  SINGULAIR  Take 1 tablet (10 mg total) by mouth daily.     multivitamin tablet  Take 1 tablet by mouth daily.     MYRBETRIQ 50 MG Tb24 tablet  Generic drug:  mirabegron ER  Take  50 mg by mouth daily.     nystatin 100000 UNIT/ML suspension  Commonly known as:  MYCOSTATIN  Swish and swallow 4 times a day as needed     omeprazole 20 MG capsule  Commonly known as:  PRILOSEC  Take 1 capsule (20 mg total) by mouth 2 (two) times daily before a meal.     polyethylene glycol packet  Commonly known as:  MIRALAX / GLYCOLAX  Take 17 g by mouth 2 (two) times daily.     ranitidine 150 MG tablet  Commonly known as:  ZANTAC  Take 1 tablet (150 mg total) by mouth at bedtime.     Tiotropium Bromide Monohydrate 2.5 MCG/ACT Aers  Commonly known as:  SPIRIVA RESPIMAT  Inhale 2 puffs into the lungs daily.     VITAMIN E COMPLETE PO  Take 1 capsule by mouth daily. 400iu     warfarin 5 MG tablet  Commonly known as:  COUMADIN  Use along with Lovenox to obtain therapeutic INR. Daily INR to monitor levels and dosing adjusted by pharmacy to obtain and maintain therapeutic INR of 2-3. Last dose in the hospital was 7.5 mg.         Signed: [place] Pugh. Kaileb Monsanto   PA-C  03/21/2016, 10:32 AM

## 2016-03-21 NOTE — Progress Notes (Signed)
Pre visit review using our clinic review tool, if applicable. No additional management support is needed unless otherwise documented below in the visit note. 

## 2016-03-21 NOTE — Progress Notes (Signed)
I agree with this plan.

## 2016-03-22 ENCOUNTER — Encounter: Payer: Self-pay | Admitting: Adult Health

## 2016-03-22 ENCOUNTER — Ambulatory Visit (INDEPENDENT_AMBULATORY_CARE_PROVIDER_SITE_OTHER)
Admission: RE | Admit: 2016-03-22 | Discharge: 2016-03-22 | Disposition: A | Discharge status: Home / Self Care | Payer: Medicare Other | Source: Ambulatory Visit | Attending: Adult Health | Admitting: Adult Health

## 2016-03-22 ENCOUNTER — Ambulatory Visit (INDEPENDENT_AMBULATORY_CARE_PROVIDER_SITE_OTHER): Payer: Medicare Other | Admitting: Adult Health

## 2016-03-22 VITALS — BP 90/60 | HR 78 | Temp 97.8°F | Ht 64.0 in | Wt 145.0 lb

## 2016-03-22 DIAGNOSIS — R05 Cough: Secondary | ICD-10-CM

## 2016-03-22 DIAGNOSIS — J440 Chronic obstructive pulmonary disease with acute lower respiratory infection: Secondary | ICD-10-CM

## 2016-03-22 DIAGNOSIS — R059 Cough, unspecified: Secondary | ICD-10-CM

## 2016-03-22 DIAGNOSIS — J441 Chronic obstructive pulmonary disease with (acute) exacerbation: Secondary | ICD-10-CM

## 2016-03-22 MED ORDER — BENZONATATE 100 MG PO CAPS: 100.0000 mg | ORAL_CAPSULE | Freq: Three times a day (TID) | ORAL | Status: DC | PRN

## 2016-03-22 MED ORDER — CEFUROXIME AXETIL 500 MG PO TABS: 500.0000 mg | ORAL_TABLET | Freq: Two times a day (BID) | ORAL | Status: AC

## 2016-03-22 MED ORDER — LEVALBUTEROL HCL 0.63 MG/3ML IN NEBU
0.6300 mg | INHALATION_SOLUTION | Freq: Once | RESPIRATORY_TRACT | Status: AC
  Administered 2016-03-22: 0.63 mg via RESPIRATORY_TRACT

## 2016-03-22 NOTE — Assessment & Plan Note (Signed)
Flare  Check cxr   Plan  Begin Ceftin 500mg  Twice daily  For 1 week .  Mucinex DM Twice daily  As needed  Cough/congestion  Follow up Dr. Ashok Cordia in 2  months and As needed   Please contact office for sooner follow up if symptoms do not improve or worsen or seek emergency care

## 2016-03-22 NOTE — Progress Notes (Signed)
   Subjective:    Patient ID: PATSI HAEUSSLER, female    DOB: [birth year], 75 y.o.   MRN: [id number]  HPI   75 yo female with COPD  Previous pt of Dr. Joya Gaskins    TEST :  12/2011 with FEV1 62%, ratio 42  , FVC 104% , LDCO 69%,  S/p pulm rehab 2015    03/22/2016 Acute OV : COPD Pt presents for an acute office visit. Complains of dry nonproductive cough, sinus pressure/drainage. For 1 week. Feels like she can not cough up anything .  Denies any SOB, wheezing, chest tightness/congestion, fever, nausea or vomiting. She had a right hip muscle repair last week at hospital .  Remains on Symbicort and Spiriva .  No chest pain, orthopnea, fever.  Using tessalon without much help      Review of Systems  Constitutional:   No  weight loss, night sweats,  Fevers, chills, fatigue, or  lassitude.  HEENT:   No headaches,  Difficulty swallowing,  Tooth/dental problems, or  Sore throat,                No sneezing, itching, ear ache,  +nasal congestion, post nasal drip,   CV:  No chest pain,  Orthopnea, PND, swelling in lower extremities, anasarca, dizziness, palpitations, syncope.   GI  No heartburn, indigestion, abdominal pain, nausea, vomiting, diarrhea, change in bowel habits, loss of appetite, bloody stools.   Resp:  .  No chest wall deformity  Skin: no rash or lesions.  GU: no dysuria, change in color of urine, no urgency or frequency.  No flank pain, no hematuria   MS:  No joint pain or swelling.  No decreased range of motion.  No back pain.  Psych:  No change in mood or affect. No depression or anxiety.  No memory loss.         Objective:   Physical Exam  Filed Vitals:   03/22/16 1125  BP: 90/60  Pulse: 78  Temp: 97.8 F (36.6 C)  TempSrc: Oral  Height: 5\' 4"  (1.626 m)  Weight: 145 lb (65.772 kg)  SpO2: 97%     GEN: A/Ox3; pleasant , NAD, elderly   HEENT:  [place]/AT,  EACs-clear, TMs-wnl, NOSE-clear, THROAT-clear, no lesions, no postnasal drip or exudate noted.   NECK:   Supple w/ fair ROM; no JVD; normal carotid impulses w/o bruits; no thyromegaly or nodules palpated; no lymphadenopathy.  RESP  Decreased BS in bases , no accessory muscle use, no dullness to percussion  CARD:  RRR, no m/r/g  , no peripheral edema, pulses intact, no cyanosis or clubbing.  GI:   Soft & nt; nml bowel sounds; no organomegaly or masses detected.  Musco: Warm bil, no deformities or joint swelling noted.  Arthritic changes of the hands   Neuro: alert, no focal deficits noted.    Skin: Warm, no lesions or rashes   Ardith Test NP-C   Pulmonary and Critical Care  03/22/2016      Assessment & Plan:

## 2016-03-22 NOTE — Patient Instructions (Signed)
Begin Ceftin 500mg  Twice daily  For 1 week .  Mucinex DM Twice daily  As needed  Cough/congestion  Follow up Dr. Ashok Cordia in 2  months and As needed   Please contact office for sooner follow up if symptoms do not improve or worsen or seek emergency care

## 2016-03-22 NOTE — Progress Notes (Signed)
Note reviewed.  Sonia Baller Ashok Cordia, M.D. [facility] Pulmonary & Critical Care Pager:  [phone] After 3pm or if no response, call [phone] 2:11 PM 03/22/2016

## 2016-03-23 ENCOUNTER — Ambulatory Visit: Payer: Medicare Other

## 2016-03-24 ENCOUNTER — Ambulatory Visit (INDEPENDENT_AMBULATORY_CARE_PROVIDER_SITE_OTHER): Payer: Medicare Other | Admitting: Internal Medicine

## 2016-03-24 ENCOUNTER — Encounter: Payer: Self-pay | Admitting: Internal Medicine

## 2016-03-24 ENCOUNTER — Telehealth: Payer: Self-pay

## 2016-03-24 VITALS — BP 120/70 | HR 78 | Ht 64.0 in | Wt 145.0 lb

## 2016-03-24 DIAGNOSIS — R1310 Dysphagia, unspecified: Secondary | ICD-10-CM

## 2016-03-24 DIAGNOSIS — K222 Esophageal obstruction: Secondary | ICD-10-CM

## 2016-03-24 DIAGNOSIS — Z7901 Long term (current) use of anticoagulants: Secondary | ICD-10-CM | POA: Diagnosis not present

## 2016-03-24 NOTE — Progress Notes (Signed)
Quick Note:  lmtcb ______ 

## 2016-03-24 NOTE — Patient Instructions (Signed)

## 2016-03-24 NOTE — Progress Notes (Signed)
HISTORY OF PRESENT ILLNESS:  [name] is a 75 y.o. female with multiple significant medical problems including but not limited to COPD (not requiring oxygen), osteoarthritis status post recent hip surgery, hypertension, and remote history of TIA/stroke (2001) for which she is on chronic Coumadin therapy. Previous patient of [name] who has seen her for screening colonoscopy and GERD complicated by symptomatic peptic stricture requiring esophageal dilation. Last colonoscopy June 2014 was normal except for mild diverticulosis. Last upper endoscopy with esophageal dilation (maximal diameter 18 mm) March 2015. She presents now with complaints of progressive intermittent solid food dysphagia. Symptoms began approximately 1 year ago and have worsened. She has been seen by pulmonology recently. Some provider apparently has tried to wean her off her PPI. Currently on 20 mg of omeprazole daily (40 mg previous). With this she has had some breakthrough reflux symptoms. Worsening of dysphagia with the same. GI review of systems is otherwise negative except for gas. She is accompanied today by her husband. Dr. Jenny Ray manages her chronic anticoagulation  REVIEW OF SYSTEMS:  All non-GI ROS negative except for allergy, anxiety, arthritis, cough, muscle cramps, urinary frequency, urinary leakage  Past Medical History  Diagnosis Date  . COPD (chronic obstructive pulmonary disease) ([place])   . GERD (gastroesophageal reflux disease)   . HLD (hyperlipidemia)   . Osteoarthritis   . Hemorrhoids   . History of cerebrovascular accident   . TIA (transient ischemic attack) last 2001    anticoagulation therapy on coumadin  . HTN (hypertension)   . DI (detrusor instability)   . Status post dilation of esophageal narrowing   . Depression 11/29/2013  . Nephrolithiasis     hx  . Pneumonia 2013  . Shingles since Nov 18, 2013    on right arm from shoulder to wrist  . Dysrhythmia   . Anxiety   . History of  kidney stones   . Basal cell carcinoma   . Allergic rhinitis   . Stroke [facility] 2001    STROKES, TIA'S  . Shortness of breath dyspnea     WITH EXERTION   . Esophageal stricture   . Hiatal hernia     Past Surgical History  Procedure Laterality Date  . Bladder suspension    . Excision of basal cell ca  2011  . Thumb surgery Right 10-15 yrs ago  . Rectocele repair  2008    A&P REPAIR -[name]  . Anterior and posterior vaginal repair  2008    A&P REPAIR-DR Ray  . Colonoscopy    . Varicose vein surgery    . Upper gastrointestinal endoscopy    . Esophagogastroduodenoscopy N/A 03/03/2014    Procedure: ESOPHAGOGASTRODUODENOSCOPY (EGD);  Surgeon: Lauren Castle, MD;  Location: [facility];  Service: Endoscopy;  Laterality: N/A;  . Balloon dilation N/A 03/03/2014    Procedure: BALLOON DILATION;  Surgeon: Lauren Castle, MD;  Location: [facility];  Service: Endoscopy;  Laterality: N/A;  . Anterior lateral lumbar fusion 4 levels Right 07/03/2015    Procedure: Right Lumbar one-two, Lumbar two-three, Lumbar three-four, Lumbar four-five Anterior lateral lumbar interbody fusion;  Surgeon: Lauren Levine, MD;  Location: [facility];  Service: Neurosurgery;  Laterality: Right;  Right L1-2 L2-3 L3-4 L4-5 Anterior lateral lumbar interbody fusion  . Lumbar percutaneous pedicle screw 4 level Right 07/03/2015    Procedure: Lumbar one-Sacral one Bilateral percutaneous pedicle screws;  Surgeon: Lauren Levine, MD;  Location: [facility];  Service: Neurosurgery;  Laterality: Right;  L1-S1  Bilateral percutaneous pedicle screws  . Vaginal hysterectomy  1983    NO BSO-[name]  . Gluteus minimus repair Right 03/15/2016    Procedure: RIGHT HIP GLUTEUS MEDIUS TENDON REPAIR;  Surgeon: Lauren Cancel, MD;  Location: [facility];  Service: Orthopedics;  Laterality: Right;    Social History [name]  reports that she quit smoking about 20 years ago. Her smoking use included Cigarettes. She started smoking  about 61 years ago. She has a 126 pack-year smoking history. She has never used smokeless tobacco. She reports that she drinks alcohol. She reports that she does not use illicit drugs.  family history includes Breast cancer in her maternal aunt and mother; COPD in her mother; Emphysema in her mother; Uterine cancer in her mother. There is no history of Colon cancer, Esophageal cancer, Diabetes, Kidney disease, Liver disease, Heart disease, Rectal cancer, or Stomach cancer.  Allergies  Allergen Reactions  . Clarithromycin     REACTION: possible rash but could have been legionarres dz with rash  . Lipitor [Atorvastatin]     MUSCLE SPASMS  . Simvastatin   . Crestor [Rosuvastatin Calcium]     fagitue and joint pain, "NO STATINS"       PHYSICAL EXAMINATION: Vital signs: BP 120/70 mmHg  Pulse 78  Ht 5\' 4"  (1.626 m)  Wt 145 lb (65.772 kg)  BMI 24.88 kg/m2  LMP  (Exact Date)  Constitutional: generally well-appearing, no acute distress. Has walker post hip surgery Psychiatric: alert and oriented x3, cooperative Eyes: extraocular movements intact, anicteric, conjunctiva pink Mouth: oral pharynx moist, no lesions Neck: supple without thyromegaly Lymph: no lymphadenopathy Cardiovascular: heart regular rate and rhythm, no murmur Lungs: clear to auscultation bilaterally Abdomen: soft, nontender, nondistended, no obvious ascites, no peritoneal signs, normal bowel sounds, no organomegaly Rectal: Omitted Extremities: no clubbing cyanosis or lower extremity edema bilaterally Skin: no lesions on visible extremities Neuro: No focal deficits. Cranial nerves intact  ASSESSMENT:  #1. GERD. Worsening symptoms with decreased PPI dosage #2. Worsening dysphagia. Secondary to known peptic stricture and exacerbated by decreased PPI dosage #3. History of peptic stricture #4. Multiple medical problems including remote history of cerebrovascular disease for which she is on Coumadin #5. Colon cancer  screening. Up-to-date. Aged out  PLAN:  #1. Reflux precautions #2. Continue omeprazole 20 mg daily. Likely need to increase to 40 mg daily #3. Schedule upper endoscopy with esophageal dilation. The patient is high-risk given her comorbidities and the need to address any coagulation therapy.The nature of the procedure, as well as the risks, benefits, and alternatives were carefully and thoroughly reviewed with the patient. Ample time for discussion and questions allowed. The patient understood, was satisfied, and agreed to proceed. #4[place] with Dr. Jenny Ray regarding chronic anticoagulation. We will ask him if it is OK to hold Coumadin 3-5 days preprocedure or that she need Lovenox bridge. I will send Dr. Jenny Ray a copy of this consultation note. My nurse will follow-up and inform the patient accordingly of the final recommendation

## 2016-03-24 NOTE — Telephone Encounter (Signed)
  RE: [name] DOB: 09-20-1941 MRN: [id number]   Dear Dr. Jenny Reichmann,    We have scheduled the above patient for an endoscopic procedure. Our records show that she is on anticoagulation therapy.   Please advise as to how long the patient may come off her therapy of Coumadin prior to the procedure, which is scheduled for 04/14/2016.  Please fax back/ or route the completed form to [facility] at [phone].   Sincerely,    Phillis Haggis

## 2016-03-25 NOTE — Progress Notes (Signed)
Quick Note:  Called spoke with pt. Reviewed results and recs. Pt voiced understanding and had no further questions. ______ 

## 2016-03-26 ENCOUNTER — Encounter: Payer: Self-pay | Admitting: Internal Medicine

## 2016-03-28 ENCOUNTER — Other Ambulatory Visit: Payer: Self-pay

## 2016-03-28 ENCOUNTER — Telehealth: Payer: Self-pay

## 2016-03-28 MED ORDER — IRBESARTAN 150 MG PO TABS: 150.0000 mg | ORAL_TABLET | Freq: Every morning | ORAL | Status: DC

## 2016-03-28 NOTE — Telephone Encounter (Signed)
30 day supply sent to [facility] for patient

## 2016-03-29 DIAGNOSIS — M7061 Trochanteric bursitis, right hip: Secondary | ICD-10-CM | POA: Diagnosis not present

## 2016-03-29 DIAGNOSIS — Z4789 Encounter for other orthopedic aftercare: Secondary | ICD-10-CM | POA: Diagnosis not present

## 2016-03-31 ENCOUNTER — Telehealth: Payer: Self-pay | Admitting: General Practice

## 2016-03-31 NOTE — Telephone Encounter (Signed)
Instructions for coumadin and Lovenox pre and post procedure. 5/6 - Last dose of coumadin 5/7 - Nothing 5/8 - Lovenox in the AM 5/9 - Lovenox in the AM 5/10 - Lovenox in the AM 5/11 - Procedure 5/12 - Lovenox and 10 mg coumadin 5/13 - Lovenox and 10 mg coumadin 5/14 - Lovenox and 7.5 mg coumadin 5/15 - Lovenox and 7.5 mg coumadin 5/16 - Continue prior dosage of coumadin and stop Lovenox 5/17 - Check INR

## 2016-04-06 ENCOUNTER — Ambulatory Visit (INDEPENDENT_AMBULATORY_CARE_PROVIDER_SITE_OTHER): Payer: Medicare Other | Admitting: General Practice

## 2016-04-06 DIAGNOSIS — Z7901 Long term (current) use of anticoagulants: Secondary | ICD-10-CM | POA: Diagnosis not present

## 2016-04-06 DIAGNOSIS — G459 Transient cerebral ischemic attack, unspecified: Secondary | ICD-10-CM

## 2016-04-06 LAB — POCT INR: INR: 1.6

## 2016-04-06 NOTE — Progress Notes (Signed)
I have reviewed and agree with the plan. 

## 2016-04-06 NOTE — Patient Instructions (Signed)
Expand All Collapse All   Instructions for coumadin and Lovenox pre and post procedure. 5/6 - Last dose of coumadin 5/7 - Nothing 5/8 - Lovenox in the AM 5/9 - Lovenox in the AM 5/10 - Lovenox in the AM 5/11 - Procedure 5/12 - Lovenox and 10 mg coumadin 5/13 - Lovenox and 10 mg coumadin 5/14 - Lovenox and 7.5 mg coumadin 5/15 - Lovenox and 7.5 mg coumadin 5/16 - Continue prior dosage of coumadin and stop Lovenox 5/17 - Check INR

## 2016-04-06 NOTE — Progress Notes (Signed)
Pre visit review using our clinic review tool, if applicable. No additional management support is needed unless otherwise documented below in the visit note. 

## 2016-04-11 ENCOUNTER — Encounter: Payer: Self-pay | Admitting: Internal Medicine

## 2016-04-14 ENCOUNTER — Encounter: Payer: Self-pay | Admitting: Internal Medicine

## 2016-04-14 ENCOUNTER — Ambulatory Visit (AMBULATORY_SURGERY_CENTER): Payer: Medicare Other | Admitting: Internal Medicine

## 2016-04-14 VITALS — BP 120/55 | HR 40 | Temp 98.0°F | Resp 18 | Ht 64.0 in | Wt 145.0 lb

## 2016-04-14 DIAGNOSIS — K222 Esophageal obstruction: Secondary | ICD-10-CM | POA: Diagnosis not present

## 2016-04-14 DIAGNOSIS — R1310 Dysphagia, unspecified: Secondary | ICD-10-CM

## 2016-04-14 DIAGNOSIS — K219 Gastro-esophageal reflux disease without esophagitis: Secondary | ICD-10-CM | POA: Diagnosis not present

## 2016-04-14 DIAGNOSIS — F329 Major depressive disorder, single episode, unspecified: Secondary | ICD-10-CM | POA: Diagnosis not present

## 2016-04-14 DIAGNOSIS — I10 Essential (primary) hypertension: Secondary | ICD-10-CM | POA: Diagnosis not present

## 2016-04-14 DIAGNOSIS — K2100 Gastro-esophageal reflux disease with esophagitis, without bleeding: Secondary | ICD-10-CM

## 2016-04-14 DIAGNOSIS — Z8673 Personal history of transient ischemic attack (TIA), and cerebral infarction without residual deficits: Secondary | ICD-10-CM | POA: Diagnosis not present

## 2016-04-14 DIAGNOSIS — J449 Chronic obstructive pulmonary disease, unspecified: Secondary | ICD-10-CM | POA: Diagnosis not present

## 2016-04-14 MED ORDER — SODIUM CHLORIDE 0.9 % IV SOLN: 500.0000 mL | INTRAVENOUS | Status: DC

## 2016-04-14 MED ORDER — OMEPRAZOLE 40 MG PO CPDR: 40.0000 mg | DELAYED_RELEASE_CAPSULE | Freq: Every day | ORAL | Status: DC

## 2016-04-14 NOTE — Progress Notes (Signed)
Called to room to assist during endoscopic procedure.  Patient ID and intended procedure confirmed with present staff. Received instructions for my participation in the procedure from the performing physician.  

## 2016-04-14 NOTE — Op Note (Signed)
[facility] Patient Name: [name] Procedure Date: 04/14/2016 10:07 AM MRN: [id number] Endoscopist: Docia Chuck. Henrene Pastor , MD Age: 75 Date of Birth: 26-Feb-1941 Gender: Female Procedure:                Upper GI endoscopy, with balloon dilation of the                            esophagus to 20 mm Indications:              Dysphagia Medicines:                Monitored Anesthesia Care Procedure:                Pre-Anesthesia Assessment:                           - Prior to the procedure, a History and Physical                            was performed, and patient medications and                            allergies were reviewed. The patient's tolerance of                            previous anesthesia was also reviewed. The risks                            and benefits of the procedure and the sedation                            options and risks were discussed with the patient.                            All questions were answered, and informed consent                            was obtained. Prior Anticoagulants: The patient has                            taken Coumadin (warfarin), last dose was 5 days                            prior to procedure. ASA Grade Assessment: III - A                            patient with severe systemic disease. After                            reviewing the risks and benefits, the patient was                            deemed in satisfactory condition to undergo the  procedure.                           After obtaining informed consent, the endoscope was                            passed under direct vision. Throughout the                            procedure, the patient's blood pressure, pulse, and                            oxygen saturations were monitored continuously. The                            Model GIF-HQ190 [id number]) scope was introduced                            through the mouth, and advanced to the  second part                            of duodenum. The upper GI endoscopy was                            accomplished without difficulty. The patient                            tolerated the procedure well. Scope In: Scope Out: Findings:                 One mild benign-appearing, intrinsic stenosis was                            found. This measured approximately 15 mm in                            diameter and was ringlike at the gastroesophageal                            junction. There is also associated esophagitis as                            manifested by small erosions. A TTS dilator was                            passed through the scope. Dilation with an 18-19-20                            mm balloon dilator was performed to 20 mm. The                            dilation site was examined post dilation and showed                            moderate improvement  in luminal narrowing.                            Estimated blood loss was minimal.                           The exam of the esophagus was otherwise normal.                           The stomach was normal, except for incidental                            benign fundic gland type polyps measuring 5 mm or                            less.                           The examined duodenum was normal.                           The cardia and gastric fundus were normal on                            retroflexion, except for a 5 cm hiatal hernia. Complications:            No immediate complications. Estimated Blood Loss:     Estimated blood loss: none. Impression:               - Benign-appearing esophageal stenosis. Dilated.                           - Essentially Normal stomach.                           - Normal examined duodenum. Recommendation:           - Resume previous diet.                           - Increase omeprazole to 40 mg daily; #30; 11                            refills.                           - Resume  Coumadin (warfarin) today and Lovenox                            (enoxaparin) today at prior doses. Refer to                            Coumadin Clinic for further adjustment of therapy. Docia Chuck. Henrene Pastor, MD 04/14/2016 10:41:45 AM This report has been signed electronically. CC Letter to:             Biagio Borg

## 2016-04-14 NOTE — Progress Notes (Signed)
Report to PACU, RN, vss, BBS= Clear.  

## 2016-04-14 NOTE — Patient Instructions (Signed)
YOU HAD AN ENDOSCOPIC PROCEDURE TODAY AT [facility]:   Refer to the procedure report that was given to you for any specific questions about what was found during the examination.  If the procedure report does not answer your questions, please call your gastroenterologist to clarify.  If you requested that your care partner not be given the details of your procedure findings, then the procedure report has been included in a sealed envelope for you to review at your convenience later.  YOU SHOULD EXPECT: Some feelings of bloating in the abdomen. Passage of more gas than usual.  Walking can help get rid of the air that was put into your GI tract during the procedure and reduce the bloating. If you had a lower endoscopy (such as a colonoscopy or flexible sigmoidoscopy) you may notice spotting of blood in your stool or on the toilet paper. If you underwent a bowel prep for your procedure, you may not have a normal bowel movement for a few days.  Please Note:  You might notice some irritation and congestion in your nose or some drainage.  This is from the oxygen used during your procedure.  There is no need for concern and it should clear up in a day or so.  SYMPTOMS TO REPORT IMMEDIATELY:   Following upper endoscopy (EGD)  Vomiting of blood or coffee ground material  New chest pain or pain under the shoulder blades  Painful or persistently difficult swallowing  New shortness of breath  Fever of 100F or higher  Black, tarry-looking stools  For urgent or emergent issues, a gastroenterologist can be reached at any hour by calling [phone].   DIET: Clear liquids until 11:30am. Soft diet for the rest of today.  Regular diet tomorrow.  Heavy or fried foods are harder to digest and may make you feel nauseous or bloated.  Likewise, meals heavy in dairy and vegetables can increase bloating.  Drink plenty of fluids but you should avoid alcoholic beverages for 24 hours.  ACTIVITY:  You  should plan to take it easy for the rest of today and you should NOT DRIVE or use heavy machinery until tomorrow (because of the sedation medicines used during the test).    FOLLOW UP: Our staff will call the number listed on your records the next business day following your procedure to check on you and address any questions or concerns that you may have regarding the information given to you following your procedure. If we do not reach you, we will leave a message.  However, if you are feeling well and you are not experiencing any problems, there is no need to return our call.  We will assume that you have returned to your regular daily activities without incident.  If any biopsies were taken you will be contacted by phone or by letter within the next 1-3 weeks.  Please call us at [phone] if you have not heard about the biopsies in 3 weeks.    SIGNATURES/CONFIDENTIALITY: You and/or your care partner have signed paperwork which will be entered into your electronic medical record.  These signatures attest to the fact that that the information above on your After Visit Summary has been reviewed and is understood.  Full responsibility of the confidentiality of this discharge information lies with you and/or your care-partner.  Take your 40mg  dose of omeprazole daily instead of 20mg .   Take your coumadin today amd lovenox as ordered.   Go to the coumadin  clinic as needed.  Thank-you for choosing [place] for your healthcare needs today.

## 2016-04-15 ENCOUNTER — Telehealth: Payer: Self-pay

## 2016-04-15 NOTE — Telephone Encounter (Signed)
  Follow up Call-  Call back number 04/14/2016 10/10/2013  Post procedure Call Back phone  # [phone] hm [phone]  Permission to leave phone message Yes Yes     Patient questions:  Do you have a fever, pain , or abdominal swelling? No. Pain Score  0 *  Have you tolerated food without any problems? Yes.    Have you been able to return to your normal activities? Yes.    Do you have any questions about your discharge instructions: Diet   No. Medications  No. Follow up visit  No.  Do you have questions or concerns about your Care? No.  Actions: * If pain score is 4 or above: No action needed, pain <4.

## 2016-04-20 ENCOUNTER — Ambulatory Visit (INDEPENDENT_AMBULATORY_CARE_PROVIDER_SITE_OTHER): Payer: Medicare Other | Admitting: General Practice

## 2016-04-20 DIAGNOSIS — Z7901 Long term (current) use of anticoagulants: Secondary | ICD-10-CM | POA: Diagnosis not present

## 2016-04-20 DIAGNOSIS — G459 Transient cerebral ischemic attack, unspecified: Secondary | ICD-10-CM

## 2016-04-20 LAB — POCT INR: INR: 1.5

## 2016-04-20 NOTE — Progress Notes (Signed)
I have reviewed and agree with the plan. 

## 2016-04-20 NOTE — Progress Notes (Signed)
Pre visit review using our clinic review tool, if applicable. No additional management support is needed unless otherwise documented below in the visit note. 

## 2016-04-22 ENCOUNTER — Encounter: Payer: Self-pay | Admitting: Internal Medicine

## 2016-04-22 ENCOUNTER — Other Ambulatory Visit: Payer: Self-pay

## 2016-04-22 DIAGNOSIS — R1310 Dysphagia, unspecified: Secondary | ICD-10-CM

## 2016-04-22 DIAGNOSIS — K2100 Gastro-esophageal reflux disease with esophagitis, without bleeding: Secondary | ICD-10-CM

## 2016-04-22 DIAGNOSIS — K222 Esophageal obstruction: Secondary | ICD-10-CM

## 2016-04-22 MED ORDER — OMEPRAZOLE 40 MG PO CPDR: 40.0000 mg | DELAYED_RELEASE_CAPSULE | Freq: Every day | ORAL | Status: DC

## 2016-04-27 ENCOUNTER — Ambulatory Visit (INDEPENDENT_AMBULATORY_CARE_PROVIDER_SITE_OTHER): Payer: Medicare Other | Admitting: General Practice

## 2016-04-27 DIAGNOSIS — G459 Transient cerebral ischemic attack, unspecified: Secondary | ICD-10-CM

## 2016-04-27 DIAGNOSIS — Z7901 Long term (current) use of anticoagulants: Secondary | ICD-10-CM | POA: Diagnosis not present

## 2016-04-27 LAB — POCT INR: INR: 2.1

## 2016-04-27 NOTE — Progress Notes (Signed)
Pre visit review using our clinic review tool, if applicable. No additional management support is needed unless otherwise documented below in the visit note. 

## 2016-04-27 NOTE — Progress Notes (Signed)
I have reviewed and agree with the plan. 

## 2016-04-29 DIAGNOSIS — Z4789 Encounter for other orthopedic aftercare: Secondary | ICD-10-CM | POA: Diagnosis not present

## 2016-05-05 DIAGNOSIS — M25551 Pain in right hip: Secondary | ICD-10-CM | POA: Diagnosis not present

## 2016-05-05 DIAGNOSIS — S76311D Strain of muscle, fascia and tendon of the posterior muscle group at thigh level, right thigh, subsequent encounter: Secondary | ICD-10-CM | POA: Diagnosis not present

## 2016-05-05 DIAGNOSIS — Z4789 Encounter for other orthopedic aftercare: Secondary | ICD-10-CM | POA: Diagnosis not present

## 2016-05-09 DIAGNOSIS — S76311D Strain of muscle, fascia and tendon of the posterior muscle group at thigh level, right thigh, subsequent encounter: Secondary | ICD-10-CM | POA: Diagnosis not present

## 2016-05-09 DIAGNOSIS — M25551 Pain in right hip: Secondary | ICD-10-CM | POA: Diagnosis not present

## 2016-05-09 DIAGNOSIS — Z4789 Encounter for other orthopedic aftercare: Secondary | ICD-10-CM | POA: Diagnosis not present

## 2016-05-11 ENCOUNTER — Encounter: Payer: Self-pay | Admitting: Pulmonary Disease

## 2016-05-11 DIAGNOSIS — S76311D Strain of muscle, fascia and tendon of the posterior muscle group at thigh level, right thigh, subsequent encounter: Secondary | ICD-10-CM | POA: Diagnosis not present

## 2016-05-11 DIAGNOSIS — Z4789 Encounter for other orthopedic aftercare: Secondary | ICD-10-CM | POA: Diagnosis not present

## 2016-05-11 DIAGNOSIS — M25551 Pain in right hip: Secondary | ICD-10-CM | POA: Diagnosis not present

## 2016-05-12 ENCOUNTER — Telehealth: Payer: Self-pay | Admitting: Pulmonary Disease

## 2016-05-12 NOTE — Telephone Encounter (Signed)
Received this message from the pt via MyChart:  I need some advise on my breathing problem. My oxygen level stays about 88 untill I blow in and out.  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 I have been on a walker for 6 weeks. I have just started using a cane. So for 6 weeks I have not been mobile. This week I started Rehab. When the therapist noticed I had trouble breathing she would not let me do any rehab until it came up. I am not sick. Could being active again cause my level to stay so low. I check it frequently and it is usually around 88 unless I have used my albuterol a little while ago. I don't feel like I'm having trouble breathing and I don't gasp for breath. I am to have a breathing test the 21st of June. At this rate I cannot pass it. Please advise. Thank you, [name] - please advise. Thanks.

## 2016-05-13 DIAGNOSIS — S76311D Strain of muscle, fascia and tendon of the posterior muscle group at thigh level, right thigh, subsequent encounter: Secondary | ICD-10-CM | POA: Diagnosis not present

## 2016-05-13 DIAGNOSIS — Z4789 Encounter for other orthopedic aftercare: Secondary | ICD-10-CM | POA: Diagnosis not present

## 2016-05-13 DIAGNOSIS — M25551 Pain in right hip: Secondary | ICD-10-CM | POA: Diagnosis not present

## 2016-05-13 NOTE — Telephone Encounter (Signed)
This information has been sent to the pt via MyChart. Nothing further was needed.

## 2016-05-13 NOTE — Telephone Encounter (Signed)
I suspect there is an element of atelectasis/lower lung collapse from her inactivity/immobility. If she is not having any chest discomfort, pain, etc and is not having any problem breathing then it should improve as she becomes more active. We need to schedule her for a 6MWT before she sees [place] in June to evaluate her saturation. Thanks.

## 2016-05-17 DIAGNOSIS — S76311D Strain of muscle, fascia and tendon of the posterior muscle group at thigh level, right thigh, subsequent encounter: Secondary | ICD-10-CM | POA: Diagnosis not present

## 2016-05-17 DIAGNOSIS — M25551 Pain in right hip: Secondary | ICD-10-CM | POA: Diagnosis not present

## 2016-05-17 DIAGNOSIS — Z4789 Encounter for other orthopedic aftercare: Secondary | ICD-10-CM | POA: Diagnosis not present

## 2016-05-19 DIAGNOSIS — Z4789 Encounter for other orthopedic aftercare: Secondary | ICD-10-CM | POA: Diagnosis not present

## 2016-05-19 DIAGNOSIS — M25551 Pain in right hip: Secondary | ICD-10-CM | POA: Diagnosis not present

## 2016-05-19 DIAGNOSIS — S76311D Strain of muscle, fascia and tendon of the posterior muscle group at thigh level, right thigh, subsequent encounter: Secondary | ICD-10-CM | POA: Diagnosis not present

## 2016-05-20 DIAGNOSIS — Z4789 Encounter for other orthopedic aftercare: Secondary | ICD-10-CM | POA: Diagnosis not present

## 2016-05-20 DIAGNOSIS — M25551 Pain in right hip: Secondary | ICD-10-CM | POA: Diagnosis not present

## 2016-05-20 DIAGNOSIS — S76311D Strain of muscle, fascia and tendon of the posterior muscle group at thigh level, right thigh, subsequent encounter: Secondary | ICD-10-CM | POA: Diagnosis not present

## 2016-05-22 DIAGNOSIS — J449 Chronic obstructive pulmonary disease, unspecified: Secondary | ICD-10-CM | POA: Diagnosis not present

## 2016-05-22 DIAGNOSIS — J0100 Acute maxillary sinusitis, unspecified: Secondary | ICD-10-CM | POA: Diagnosis not present

## 2016-05-23 ENCOUNTER — Ambulatory Visit: Payer: Medicare Other

## 2016-05-23 ENCOUNTER — Telehealth: Payer: Self-pay | Admitting: Pulmonary Disease

## 2016-05-23 NOTE — Telephone Encounter (Signed)
Went to UC yesterday and found out she has bronchitis.  She called to cancel her 35mw and PFT because she is sick.  Patient wanted to keep her appointment on 6/21 in case she needs it for her bronchitis.  She said she would call to reschedule the 6MW and PFT when she is feeling better.   Nothing further needed.

## 2016-05-25 ENCOUNTER — Ambulatory Visit (INDEPENDENT_AMBULATORY_CARE_PROVIDER_SITE_OTHER)
Admission: RE | Admit: 2016-05-25 | Discharge: 2016-05-25 | Disposition: A | Discharge status: Home / Self Care | Payer: Medicare Other | Source: Ambulatory Visit | Attending: Pulmonary Disease | Admitting: Pulmonary Disease

## 2016-05-25 ENCOUNTER — Encounter: Payer: Self-pay | Admitting: Pulmonary Disease

## 2016-05-25 ENCOUNTER — Telehealth: Payer: Self-pay | Admitting: Pulmonary Disease

## 2016-05-25 ENCOUNTER — Ambulatory Visit (INDEPENDENT_AMBULATORY_CARE_PROVIDER_SITE_OTHER): Payer: Medicare Other | Admitting: General Practice

## 2016-05-25 ENCOUNTER — Ambulatory Visit (INDEPENDENT_AMBULATORY_CARE_PROVIDER_SITE_OTHER): Payer: Medicare Other | Admitting: Pulmonary Disease

## 2016-05-25 VITALS — BP 126/64 | HR 99 | Ht 64.0 in | Wt 147.0 lb

## 2016-05-25 DIAGNOSIS — Z7901 Long term (current) use of anticoagulants: Secondary | ICD-10-CM

## 2016-05-25 DIAGNOSIS — J449 Chronic obstructive pulmonary disease, unspecified: Secondary | ICD-10-CM | POA: Diagnosis not present

## 2016-05-25 DIAGNOSIS — J309 Allergic rhinitis, unspecified: Secondary | ICD-10-CM | POA: Diagnosis not present

## 2016-05-25 DIAGNOSIS — R059 Cough, unspecified: Secondary | ICD-10-CM

## 2016-05-25 DIAGNOSIS — R05 Cough: Secondary | ICD-10-CM | POA: Diagnosis not present

## 2016-05-25 DIAGNOSIS — G459 Transient cerebral ischemic attack, unspecified: Secondary | ICD-10-CM

## 2016-05-25 DIAGNOSIS — K219 Gastro-esophageal reflux disease without esophagitis: Secondary | ICD-10-CM

## 2016-05-25 DIAGNOSIS — R351 Nocturia: Secondary | ICD-10-CM | POA: Diagnosis not present

## 2016-05-25 LAB — POCT INR: INR: 3.3

## 2016-05-25 MED ORDER — PREDNISONE 20 MG PO TABS
40.0000 mg | ORAL_TABLET | Freq: Every day | ORAL | Status: DC
Start: 2016-05-25 — End: 2016-06-01

## 2016-05-25 MED ORDER — DOXYCYCLINE HYCLATE 100 MG PO TABS: 100.0000 mg | ORAL_TABLET | Freq: Two times a day (BID) | ORAL | Status: DC

## 2016-05-25 NOTE — Patient Instructions (Addendum)
   Call me if you get any worse with regards to your cough or breathing.  Remember to take your antibiotic with a full glass of water and remain upright for 1 hour afterward. Also avoid taking the antibiotic with dairy products as this will make the antibiotic ineffective. Also avoid excessive sunlight as this antibiotic will make you more prone to bruising.  Stop taking the Ceftin antibiotic.  Continue taking Mucinex twice daily with plenty of liquids to help bring up the mucus.  Stop using your nasal sprays for now.  We will see you back in 1 week to make sure you're getting better. You will have a follow-up appointment with me in 3 months with breathing tests at that time.  TESTS ORDERED: 1. Sputum culture for AFB, fungus, and bacteria. 2. Spirometry with bronchodilator challenge at three-month follow-up 3. 6 minute walk test on room air at three-month follow-up 4. Chest x-ray PA/LAT today

## 2016-05-25 NOTE — Progress Notes (Signed)
I have reviewed and agree with the plan. 

## 2016-05-25 NOTE — Progress Notes (Signed)
Pre visit review using our clinic review tool, if applicable. No additional management support is needed unless otherwise documented below in the visit note. 

## 2016-05-25 NOTE — Progress Notes (Signed)
Subjective:    Patient ID: [name], female    DOB: 1941-05-05, 75 y.o.   MRN: [id number]  C.C.:  Follow-up for Cough with known Moderate COPD, Allergic Rhinitis, & GERD.  HPI  Cough:  Patient was seen at Urgent Care on Sunday. She was started on Ceftin but reports cough is not improving. She has been taking Mucinex & Tessalon Perles. She feels her cough is coming from post-nasal drainage. She reports her cough produces an intermittent yellow mucus. Had an IM dose of steroids on Sunday.   Moderate COPD:  On Symbicort & Spiriva and reports compliance. Last seen 4/18 for COPD Flare. She reports some wheezing recently. She has been doing rehab. Has been using her rescue inhaler intermittently but doesn't seem to help with her cough. Does help with her dyspnea at rehab.  Allergic Rhinitis: On Astelin, Flonase, Singulair, & twice daily nasal saline rinse. She has been having more sinus pressure & congestion. More post-nasal drainage.   GERD:  She is following with Dr. Kathrine Cords. Underwent EGD with dilation of esophagus on 04/14/16. Patient's Prilosec increased to 40 mg by mouth daily postprocedure. She reports her dysphagia has improved but not totally resolved. No reflux or dyspepsia.   Review of Systems  No fever or sweats. She is having hot & cold chills. She reports fatigue and lack of energy. She reports a "heaviness" in her chest. Denies any chest pain or pressure.   Allergies  Allergen Reactions  . Clarithromycin     REACTION: possible rash but could have been legionarres dz with rash  . Lipitor [Atorvastatin]     MUSCLE SPASMS  . Simvastatin     Muscle and joint pain  . Crestor [Rosuvastatin Calcium]     fagitue and joint pain, "NO STATINS"    Current Outpatient Prescriptions on File Prior to Visit  Medication Sig Dispense Refill  . albuterol (PROVENTIL HFA;VENTOLIN HFA) 108 (90 BASE) MCG/ACT inhaler Inhale 1-2 puffs into the lungs every 6 (six) hours as needed for wheezing or  shortness of breath. 3 Inhaler 4  . amLODipine (NORVASC) 5 MG tablet Take 1 tablet (5 mg total) by mouth every morning. 90 tablet 3  . azelastine (ASTELIN) 0.1 % nasal spray Place 2 sprays into the nose daily. Use in each nostril as directed 90 mL 1  . benzonatate (TESSALON) 100 MG capsule Take 1 capsule (100 mg total) by mouth 3 (three) times daily as needed for cough. 90 capsule 0  . budesonide-formoterol (SYMBICORT) 160-4.5 MCG/ACT inhaler Inhale 2 puffs into the lungs 2 (two) times daily. 3 Inhaler 4  . Calcium Carbonate-Vitamin D (CALCIUM + D PO) Take 1 tablet by mouth 2 (two) times daily.     . citalopram (CELEXA) 20 MG tablet Take 1 tablet (20 mg total) by mouth daily. 90 tablet 3  . cyclobenzaprine (FLEXERIL) 5 MG tablet Take 1 tablet (5 mg total) by mouth 3 (three) times daily as needed for muscle spasms. 270 tablet 0  . fluticasone (FLONASE) 50 MCG/ACT nasal spray Place 1 spray into both nostrils 2 (two) times daily. 48 g 1  . irbesartan (AVAPRO) 150 MG tablet Take 1 tablet (150 mg total) by mouth every morning. 30 tablet 0  . lidocaine (XYLOCAINE JELLY) 2 % jelly Apply 1 application topically as needed. (Patient taking differently: Apply 1 application topically as needed. For shingles) 30 mL 1  . Magnesium 250 MG TABS Take 1 tablet by mouth daily as needed. Leg cramps    .  Melatonin 1 MG TABS Take 3 mg by mouth at bedtime.     . mirabegron ER (MYRBETRIQ) 50 MG TB24 tablet Take 50 mg by mouth daily.    . montelukast (SINGULAIR) 10 MG tablet Take 1 tablet (10 mg total) by mouth daily. (Patient taking differently: Take 10 mg by mouth every morning. ) 90 tablet 3  . Multiple Vitamin (MULTIVITAMIN) tablet Take 1 tablet by mouth daily.      Marland Kitchen omeprazole (PRILOSEC) 40 MG capsule Take 1 capsule (40 mg total) by mouth daily. 90 capsule 3  . Respiratory Therapy Supplies (FLUTTER) DEVI Use 2-3 times per day 1 each 0  . Spacer/Aero-Holding Chambers (AEROCHAMBER Z-STAT PLUS) inhaler Use as instructed  1 each 0  . Tiotropium Bromide Monohydrate (SPIRIVA RESPIMAT) 2.5 MCG/ACT AERS Inhale 2 puffs into the lungs daily. 3 Inhaler 3  . Vitamin Mixture (VITAMIN E COMPLETE PO) Take 1 capsule by mouth daily. 400iu    . warfarin (COUMADIN) 5 MG tablet Use along with Lovenox to obtain therapeutic INR. Daily INR to monitor levels and dosing adjusted by pharmacy to obtain and maintain therapeutic INR of 2-3. Last dose in the hospital was 7.5 mg. 40 tablet 0  . [DISCONTINUED] valsartan (DIOVAN) 160 MG tablet Take 1 tablet (160 mg total) by mouth daily. 90 tablet 3   No current facility-administered medications on file prior to visit.    Past Medical History  Diagnosis Date  . COPD (chronic obstructive pulmonary disease) ([place])   . GERD (gastroesophageal reflux disease)   . HLD (hyperlipidemia)   . Osteoarthritis   . Hemorrhoids   . History of cerebrovascular accident   . TIA (transient ischemic attack) last 2001    anticoagulation therapy on coumadin  . HTN (hypertension)   . DI (detrusor instability)   . Status post dilation of esophageal narrowing   . Depression 11/29/2013  . Nephrolithiasis     hx  . Pneumonia 2013  . Shingles since Nov 18, 2013    on right arm from shoulder to wrist  . Dysrhythmia   . Anxiety   . History of kidney stones   . Basal cell carcinoma   . Allergic rhinitis   . Stroke [facility]) 2001    STROKES, TIA'S  . Shortness of breath dyspnea     WITH EXERTION   . Esophageal stricture   . Hiatal hernia   . Allergy   . Clotting disorder ([place])   . Cataract     bil cataracts removed    Past Surgical History  Procedure Laterality Date  . Bladder suspension    . Excision of basal cell ca  2011  . Thumb surgery Right 10-15 yrs ago  . Rectocele repair  2008    A&P REPAIR -DR. MCDIARMID  . Anterior and posterior vaginal repair  2008    A&P REPAIR-DR MCDIARMID  . Colonoscopy    . Varicose vein surgery    . Upper gastrointestinal endoscopy    .  Esophagogastroduodenoscopy N/A 03/03/2014    Procedure: ESOPHAGOGASTRODUODENOSCOPY (EGD);  Surgeon: Inda Castle, MD;  Location: [facility];  Service: Endoscopy;  Laterality: N/A;  . Balloon dilation N/A 03/03/2014    Procedure: BALLOON DILATION;  Surgeon: Inda Castle, MD;  Location: [facility] ENDOSCOPY;  Service: Endoscopy;  Laterality: N/A;  . Anterior lateral lumbar fusion 4 levels Right 07/03/2015    Procedure: Right Lumbar one-two, Lumbar two-three, Lumbar three-four, Lumbar four-five Anterior lateral lumbar interbody fusion;  Surgeon: Erline Levine, MD;  Location: [place] [facility];  Service: Neurosurgery;  Laterality: Right;  Right L1-2 L2-3 L3-4 L4-5 Anterior lateral lumbar interbody fusion  . Lumbar percutaneous pedicle screw 4 level Right 07/03/2015    Procedure: Lumbar one-Sacral one Bilateral percutaneous pedicle screws;  Surgeon: Erline Levine, MD;  Location: [place] [facility] OR[facility];  Service: Neurosurgery;  Laterality: Right;  L1-S1 Bilateral percutaneous pedicle screws  . Vaginal hysterectomy  1983    NO BSO-DR. MABRY  . Gluteus minimus repair Right 03/15/2016    Procedure: RIGHT HIP GLUTEUS MEDIUS TENDON REPAIR;  Surgeon: Paralee Cancel, MD;  Location: [facility] ORS;  Service: Orthopedics;  Laterality: Right;    Family History  Problem Relation Age of Onset  . COPD Mother   . Breast cancer Mother     mid 66's  . Uterine cancer Mother   . Emphysema Mother   . Breast cancer Maternal Aunt     x 3 aunts, post menopausal  . Colon cancer Neg Hx   . Esophageal cancer Neg Hx   . Rectal cancer Neg Hx   . Stomach cancer Neg Hx   . Pancreatic cancer Neg Hx     Social History   Social History  . Marital Status: Married    Spouse Name: N/A  . Number of Children: 2  . Years of Education: N/A   Occupational History  . retired    Social History Main Topics  . Smoking status: Former Smoker -- 3.00 packs/day for 42 years    Types: Cigarettes    Start date: 08/04/1954    Quit date: 12/06/1995  .  Smokeless tobacco: Never Used  . Alcohol Use: 0.0 oz/week    0 Standard drinks or equivalent per week     Comment: occas very little  . Drug Use: No  . Sexual Activity: Not Currently    Birth Control/ Protection: Surgical     Comment: HYST-1st intercourse 18 yo-2 partners   Other Topics Concern  . None   Social History Narrative   Retired- Dentist Pulmonary:   Originally from [place]. Has always lived in [place]. She has traveled to [place], [place], [place], [place], [place], [place]. Has been on multiple cruises. 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 She did use a face mask consistently to avoid chemical fume exposure. She may have only had an inhaled exposure a couple of times. She currently has a dog. Remote exposure to a parakeet. No mold or hot tub exposure. No asbestos exposure.       Objective:   Physical Exam BP 126/64 mmHg  Pulse 99  Ht 5\' 4"  (1.626 m)  Wt 147 lb (66.679 kg)  BMI 25.22 kg/m2  SpO2 96%  LMP  (Exact Date) General:  Awake. Alert. Accompanied by husband today. No distress. Integument:  Warm & dry. No rash on exposed skin.  HEENT:  No scleral injection or icterus. Mild nasal turbinate swelling. Mild maxillary sinus tenderness to palpation. Cardiovascular:  Regular rate. No edema. Normal S1 & S2. Pulmonary:  Coarse wheeze bilaterally but good aeration. Normal work of breathing on room air. Intermittent coughing.  Musculoskeletal:  Normal bulk and tone. No joint deformity or effusion appreciated.  PFT 03/01/16: FVC 2.55 L (91%) FEV1 1.30 L (62%) FEV1/FVC 0.51 FEF 25-75 0.56 L (34%) no bronchodilator response TLC 5.66 L (111%) RV 116% ERV 133% DLCO uncorrected 62% (hemoglobin 15.1) 12/28/11:FVC 2.89 L (104%)  FEV1 1.22 L (62%) FEV1/FVC 0.42 FEF 25-75 0.33 L (15%) no bronchodilator response TLC 6.19 L (129%) RV 163% ERV 114% DLCO uncorrected  69%  IMAGING CXR PA/LAT 03/22/16 (personally reviewed by me):  Hyperinflation with elevation of left hemidiaphragm. No new focal opacity. Heart normal in size & mediastinum normal in contour.  CXR PA/LAT 11/09/15 (previously reviewed by me): No focal opacity or effusion appreciated. Heart normal in size. Mediastinum normal in contour. Mild hyperinflation with flattening of the diaphragms.   LABS 12/22/15 Alpha-1 antitrypsin: MM (143)    Assessment & Plan:  75 year old female with underlying moderate COPD. Suspect cough is secondary to acute bronchitis versus acute sinusitis. Patient is not clinically improving on her current antibiotic. Switching to doxycycline to cover atypicals. Starting patient on short course of prednisone therapy. Patient will continue her current inhaler regimen for now pending pulmonary function testing at follow-up appointment. Holding nasal saline sprays for now with possible acute sinusitis. I'm doubtful the patient has underlying pneumonia but this will need to be investigated with chest x-ray imaging. I instructed the patient to contact our office if she had any worsening in her breathing.I have sent a message to the patient's Coumadin nurse to make her aware of the antibiotic adjustment I'm making today to adjust dosing.  1. Cough: Suspect acute bronchitis vs acute sinusitis. Checking sputum culture for AFB, fungus, and bacteria. Switching patient from Ceftin to doxycycline 100 mg by mouth twice a day 7 days. Also starting the patient on prednisone 40 mg by mouth daily 4 days. Patient to continue twice-daily Mucinex and hydration. Checking chest x-ray PA/LAT today. 2. Moderate COPD: Continuing Spiriva & Symbicort. No changes. Repeat spirometry with bronchodilator challenge next appointment along with 6 minute walk test on room air.. 3. Allergic rhinitis: Continuing Singulair. Holding on Flonase, Astelin, and nasal saline rinse at this time.  4. GERD: Follows with GI.  Continuing on Prilosec 40 mg by mouth daily. 5. Health maintenance: Influenza vaccine September 2016, Prevnar December 2014, & Pneumovax January 2008. 6. Follow-up: Patient to return to clinic in 1 week to be seen by next available provider & 3 months to be seen by me.  Sonia Baller Ashok Cordia, M.D. [facility] Pulmonary & Critical Care Pager:  [phone] After 3pm or if no response, call [phone] 1:52 PM 05/25/2016

## 2016-05-25 NOTE — Telephone Encounter (Signed)
Spoke with Crystal at [facility] - they do not have Doxy in tablet form but they do have capsules. Per RB okay to prescribe the capsules as long as long as there are no major discrepancies in the 2 types.  Per Crystal, there is no difference other than the form. Verbal given for the change. Nothing further needed.

## 2016-05-25 NOTE — Addendum Note (Signed)
Addended by: Parke Poisson E on: 05/25/2016 02:38 PM   Modules accepted: Orders

## 2016-05-30 DIAGNOSIS — H524 Presbyopia: Secondary | ICD-10-CM | POA: Diagnosis not present

## 2016-05-30 DIAGNOSIS — H04123 Dry eye syndrome of bilateral lacrimal glands: Secondary | ICD-10-CM | POA: Diagnosis not present

## 2016-05-31 DIAGNOSIS — S76311D Strain of muscle, fascia and tendon of the posterior muscle group at thigh level, right thigh, subsequent encounter: Secondary | ICD-10-CM | POA: Diagnosis not present

## 2016-05-31 DIAGNOSIS — M25551 Pain in right hip: Secondary | ICD-10-CM | POA: Diagnosis not present

## 2016-05-31 DIAGNOSIS — Z4789 Encounter for other orthopedic aftercare: Secondary | ICD-10-CM | POA: Diagnosis not present

## 2016-06-01 ENCOUNTER — Encounter: Payer: Self-pay | Admitting: Acute Care

## 2016-06-01 ENCOUNTER — Ambulatory Visit (INDEPENDENT_AMBULATORY_CARE_PROVIDER_SITE_OTHER): Payer: Medicare Other | Admitting: General Practice

## 2016-06-01 ENCOUNTER — Telehealth: Payer: Self-pay | Admitting: Internal Medicine

## 2016-06-01 ENCOUNTER — Ambulatory Visit (INDEPENDENT_AMBULATORY_CARE_PROVIDER_SITE_OTHER): Payer: Medicare Other | Admitting: Acute Care

## 2016-06-01 VITALS — BP 120/64 | HR 43 | Ht 64.0 in | Wt 146.0 lb

## 2016-06-01 DIAGNOSIS — I499 Cardiac arrhythmia, unspecified: Secondary | ICD-10-CM

## 2016-06-01 DIAGNOSIS — M25551 Pain in right hip: Secondary | ICD-10-CM | POA: Diagnosis not present

## 2016-06-01 DIAGNOSIS — Z7901 Long term (current) use of anticoagulants: Secondary | ICD-10-CM | POA: Diagnosis not present

## 2016-06-01 DIAGNOSIS — J441 Chronic obstructive pulmonary disease with (acute) exacerbation: Secondary | ICD-10-CM

## 2016-06-01 DIAGNOSIS — Z4789 Encounter for other orthopedic aftercare: Secondary | ICD-10-CM | POA: Diagnosis not present

## 2016-06-01 DIAGNOSIS — R001 Bradycardia, unspecified: Secondary | ICD-10-CM

## 2016-06-01 DIAGNOSIS — S76311D Strain of muscle, fascia and tendon of the posterior muscle group at thigh level, right thigh, subsequent encounter: Secondary | ICD-10-CM | POA: Diagnosis not present

## 2016-06-01 DIAGNOSIS — G459 Transient cerebral ischemic attack, unspecified: Secondary | ICD-10-CM

## 2016-06-01 LAB — POCT INR: INR: 1.6

## 2016-06-01 NOTE — Assessment & Plan Note (Signed)
Resolving flare: Plan: Continue your Symbicort and Spiriva. Continue using your Albuterol inhaler  Resume your Astelin and Flonase Use the nasal irrigation as needed for increased nasal congestion and stuffiness 12 Lead EKG today for low heart rate. Heart rate was 75 per EKG Follow up with Dr. Harrington Challenger ( Cards). Continue your Prilosec 40 mg daily. You can try adding Zantac before dinner to see if this helps with your reflux at night. Sleep propped up or place your bed on blocks. 3 month follow up with Dr. Ashok Cordia after 6 minute walk and PFT's same day.. Please contact office for sooner follow up if symptoms do not improve or worsen or seek emergency care

## 2016-06-01 NOTE — Progress Notes (Signed)
Pre visit review using our clinic review tool, if applicable. No additional management support is needed unless otherwise documented below in the visit note. 

## 2016-06-01 NOTE — Progress Notes (Signed)
History of Present Illness [name] is a 75 y.o. female with Moderate COPD,Allergic Rhinitis and GERD followed by Dr. Ashok Cordia.   6/28/2017Follow Up Appointment for Acute Bronchitis  Vs. Sinusitis. [name] was treated by Dr. Ashok Cordia with Doxycycline x 7 days, and a prednisone taper x 4 days. She continued to use Mucinex twice daily.She presents today and feels better.She states she is 100% better. Cough is better, sometimes productive with clear secretions.She states she is no longer wheezing. She is using her incentive spirometer several times daily.She denies fever, chest pain, orthopnea of hemoptysis. She is undergoing rehab for recent surgery, and she states she gets very fatigued with physical therapy. She is currently going to therapy 3 times weekly.She denies chest pain, orthopnea fever or hemoptysis.  Tests 05/25/2016 CXR:  1. COPD with mild interstitial prominence, stable. No evidence of pneumonia nor pulmonary edema. 2. Aortic atherosclerosis.  Past medical hx Past Medical History  Diagnosis Date  . COPD (chronic obstructive pulmonary disease) ([place])   . GERD (gastroesophageal reflux disease)   . HLD (hyperlipidemia)   . Osteoarthritis   . Hemorrhoids   . History of cerebrovascular accident   . TIA (transient ischemic attack) last 2001    anticoagulation therapy on coumadin  . HTN (hypertension)   . DI (detrusor instability)   . Status post dilation of esophageal narrowing   . Depression 11/29/2013  . Nephrolithiasis     hx  . Pneumonia 2013  . Shingles since Nov 18, 2013    on right arm from shoulder to wrist  . Dysrhythmia   . Anxiety   . History of kidney stones   . Basal cell carcinoma   . Allergic rhinitis   . Stroke [facility]) 2001    STROKES, TIA'S  . Shortness of breath dyspnea     WITH EXERTION   . Esophageal stricture   . Hiatal hernia   . Allergy   . Clotting disorder ([place])   . Cataract     bil cataracts removed     Past surgical hx, Family  hx, Social hx all reviewed.  Current Outpatient Prescriptions on File Prior to Visit  Medication Sig  . albuterol (PROVENTIL HFA;VENTOLIN HFA) 108 (90 BASE) MCG/ACT inhaler Inhale 1-2 puffs into the lungs every 6 (six) hours as needed for wheezing or shortness of breath.  Marland Kitchen amLODipine (NORVASC) 5 MG tablet Take 1 tablet (5 mg total) by mouth every morning.  Marland Kitchen azelastine (ASTELIN) 0.1 % nasal spray Place 2 sprays into the nose daily. Use in each nostril as directed  . benzonatate (TESSALON) 100 MG capsule Take 1 capsule (100 mg total) by mouth 3 (three) times daily as needed for cough.  . budesonide-formoterol (SYMBICORT) 160-4.5 MCG/ACT inhaler Inhale 2 puffs into the lungs 2 (two) times daily.  . Calcium Carbonate-Vitamin D (CALCIUM + D PO) Take 1 tablet by mouth 2 (two) times daily.   . citalopram (CELEXA) 20 MG tablet Take 1 tablet (20 mg total) by mouth daily.  . cyclobenzaprine (FLEXERIL) 5 MG tablet Take 1 tablet (5 mg total) by mouth 3 (three) times daily as needed for muscle spasms.  . fluticasone (FLONASE) 50 MCG/ACT nasal spray Place 1 spray into both nostrils 2 (two) times daily.  . irbesartan (AVAPRO) 150 MG tablet Take 1 tablet (150 mg total) by mouth every morning.  . lidocaine (XYLOCAINE JELLY) 2 % jelly Apply 1 application topically as needed. (Patient taking differently: Apply 1 application topically as needed. For shingles)  .  Magnesium 250 MG TABS Take 1 tablet by mouth daily as needed. Leg cramps  . Melatonin 1 MG TABS Take 3 mg by mouth at bedtime.   . mirabegron ER (MYRBETRIQ) 50 MG TB24 tablet Take 50 mg by mouth daily.  . montelukast (SINGULAIR) 10 MG tablet Take 1 tablet (10 mg total) by mouth daily. (Patient taking differently: Take 10 mg by mouth every morning. )  . Multiple Vitamin (MULTIVITAMIN) tablet Take 1 tablet by mouth daily.    Marland Kitchen omeprazole (PRILOSEC) 40 MG capsule Take 1 capsule (40 mg total) by mouth daily.  Marland Kitchen Respiratory Therapy Supplies (FLUTTER) DEVI Use  2-3 times per day  . Spacer/Aero-Holding Chambers (AEROCHAMBER Z-STAT PLUS) inhaler Use as instructed  . Tiotropium Bromide Monohydrate (SPIRIVA RESPIMAT) 2.5 MCG/ACT AERS Inhale 2 puffs into the lungs daily.  . Vitamin Mixture (VITAMIN E COMPLETE PO) Take 1 capsule by mouth daily. 400iu  . warfarin (COUMADIN) 5 MG tablet Use along with Lovenox to obtain therapeutic INR. Daily INR to monitor levels and dosing adjusted by pharmacy to obtain and maintain therapeutic INR of 2-3. Last dose in the hospital was 7.5 mg.  . [DISCONTINUED] valsartan (DIOVAN) 160 MG tablet Take 1 tablet (160 mg total) by mouth daily.   No current facility-administered medications on file prior to visit.     Allergies  Allergen Reactions  . Clarithromycin     REACTION: possible rash but could have been legionarres dz with rash  . Lipitor [Atorvastatin]     MUSCLE SPASMS  . Simvastatin     Muscle and joint pain  . Crestor [Rosuvastatin Calcium]     fagitue and joint pain, "NO STATINS"    Review Of Systems:  Constitutional:   No  weight loss, night sweats,  Fevers, chills, fatigue, or  lassitude.  HEENT:   No headaches,  Difficulty swallowing,  Tooth/dental problems, or  Sore throat,                No sneezing, itching, ear ache, nasal congestion, post nasal drip,   CV:  No chest pain,  Orthopnea, PND, swelling in lower extremities, anasarca, dizziness, palpitations, syncope.   GI  + heartburn, indigestion, abdominal pain, nausea, vomiting, diarrhea, change in bowel habits, loss of appetite, bloody stools.   Resp: + shortness of breath with exertion not  at rest.  + excess mucus, + productive cough,  No non-productive cough,  No coughing up of blood.  No change in color of mucus.  No wheezing.  No chest wall deformity  Skin: no rash or lesions.  GU: no dysuria, change in color of urine, no urgency or frequency.  No flank pain, no hematuria   MS:  No joint pain or swelling.  No decreased range of motion.  No  back pain.  Psych:  No change in mood or affect. No depression or anxiety.  No memory loss.   Vital Signs BP 120/64 mmHg  Pulse 43  Ht 5\' 4"  (1.626 m)  Wt 146 lb (66.225 kg)  BMI 25.05 kg/m2  SpO2 95%  LMP  (Exact Date)   Physical Exam:  General- No distress,  A&Ox3, pleasant ENT: No sinus tenderness, TM clear, pale nasal mucosa, no oral exudate,no post nasal drip, no LAN Cardiac: S1, S2, slow  rate and rhythm, no murmur Chest: No wheeze/ rales/ dullness; no accessory muscle use, no nasal flaring, no sternal retractions Abd.: Soft Non-tender Ext: No clubbing cyanosis, edema Neuro:  normal strength Skin: No rashes, warm  and dry Psych: normal mood and behavior   Assessment/Plan  Gold B Copd with acute bronchitis Resolving flare: Plan: Continue your Symbicort and Spiriva. Continue using your Albuterol inhaler  Resume your Astelin and Flonase Use the nasal irrigation as needed for increased nasal congestion and stuffiness 12 Lead EKG today for low heart rate. Heart rate was 75 per EKG Follow up with Dr. Harrington Challenger ( Cards). Continue your Prilosec 40 mg daily. You can try adding Zantac before dinner to see if this helps with your reflux at night. Sleep propped up or place your bed on blocks. 3 month follow up with Dr. Ashok Cordia after 6 minute walk and PFT's same day.. Please contact office for sooner follow up if symptoms do not improve or worsen or seek emergency care       Magdalen Spatz, NP 06/01/2016  3:44 PM

## 2016-06-01 NOTE — Telephone Encounter (Signed)
Called patient, spoke with her husband. Pt is at rehab. She saw pulmonary this am and they recommended follow up with cardiology.  I asked about palpitations as the operator's note states. He is not aware of palpitations but ---he know that her heart rate is slow and she is very fatigued.  Mr. Baab states HR in pulmonary appointment was 47s.  Appointment made with [name] on 06/09/16 at 1:30 pm. Asked that patient call back if patient has a conflict with this appointment.   He verbalizes understanding and is appreciative for the helpfulness.

## 2016-06-01 NOTE — Telephone Encounter (Signed)
New message   Patient c/o Palpitations:  High priority if patient c/o lightheadedness and shortness of breath.  1. How long have you been having palpitations? Since she has been in rehab, 3 weeks ago  2. Are you currently experiencing lightheadedness and shortness of breath? No, only with rehab 3. Have you checked your BP and heart rate? (document readings) 40  To 75  4. Are you experiencing any other symptoms? tired

## 2016-06-01 NOTE — Progress Notes (Signed)
I have reviewed and agree with the plan. 

## 2016-06-01 NOTE — Patient Instructions (Addendum)
I am glad you are feeling better today. Continue your Symbicort and Spiriva. Continue using your Albuterol inhaler  Resume your Astelin and Flonase Use the nasal irrigation as needed for increased nasal congestion and stuffiness 12 Lead EKG today for low heart rate. Heart rate was 75 per EKG Follow up with Dr. Harrington Challenger ( Cards). Continue your Prilosec 40 mg daily. You can try adding Zantac before dinner to see if this helps with your reflux at night. Sleep propped up or place your bed on blocks. 3 month follow up with Dr. Ashok Cordia after 6 minute walk and PFT's same day.. Please contact office for sooner follow up if symptoms do not improve or worsen or seek emergency care

## 2016-06-01 NOTE — Progress Notes (Signed)
Note reviewed.  Sonia Baller Ashok Cordia, M.D. [facility] Pulmonary & Critical Care Pager:  [phone] After 3pm or if no response, call [phone] 3:52 PM 06/01/2016

## 2016-06-03 DIAGNOSIS — M25551 Pain in right hip: Secondary | ICD-10-CM | POA: Diagnosis not present

## 2016-06-03 DIAGNOSIS — Z4789 Encounter for other orthopedic aftercare: Secondary | ICD-10-CM | POA: Diagnosis not present

## 2016-06-03 DIAGNOSIS — S76311D Strain of muscle, fascia and tendon of the posterior muscle group at thigh level, right thigh, subsequent encounter: Secondary | ICD-10-CM | POA: Diagnosis not present

## 2016-06-08 DIAGNOSIS — M25551 Pain in right hip: Secondary | ICD-10-CM | POA: Diagnosis not present

## 2016-06-08 DIAGNOSIS — Z4789 Encounter for other orthopedic aftercare: Secondary | ICD-10-CM | POA: Diagnosis not present

## 2016-06-08 DIAGNOSIS — S76311D Strain of muscle, fascia and tendon of the posterior muscle group at thigh level, right thigh, subsequent encounter: Secondary | ICD-10-CM | POA: Diagnosis not present

## 2016-06-09 ENCOUNTER — Ambulatory Visit (INDEPENDENT_AMBULATORY_CARE_PROVIDER_SITE_OTHER): Payer: Medicare Other | Admitting: Cardiology

## 2016-06-09 ENCOUNTER — Encounter: Payer: Self-pay | Admitting: Cardiology

## 2016-06-09 VITALS — BP 130/60 | HR 91 | Ht 64.0 in | Wt 147.8 lb

## 2016-06-09 DIAGNOSIS — R0602 Shortness of breath: Secondary | ICD-10-CM | POA: Diagnosis not present

## 2016-06-09 DIAGNOSIS — R011 Cardiac murmur, unspecified: Secondary | ICD-10-CM | POA: Diagnosis not present

## 2016-06-09 DIAGNOSIS — S76311D Strain of muscle, fascia and tendon of the posterior muscle group at thigh level, right thigh, subsequent encounter: Secondary | ICD-10-CM | POA: Diagnosis not present

## 2016-06-09 DIAGNOSIS — M25551 Pain in right hip: Secondary | ICD-10-CM | POA: Diagnosis not present

## 2016-06-09 DIAGNOSIS — E785 Hyperlipidemia, unspecified: Secondary | ICD-10-CM

## 2016-06-09 DIAGNOSIS — R000 Tachycardia, unspecified: Secondary | ICD-10-CM | POA: Diagnosis not present

## 2016-06-09 DIAGNOSIS — I493 Ventricular premature depolarization: Secondary | ICD-10-CM

## 2016-06-09 DIAGNOSIS — Z7901 Long term (current) use of anticoagulants: Secondary | ICD-10-CM

## 2016-06-09 DIAGNOSIS — Z4789 Encounter for other orthopedic aftercare: Secondary | ICD-10-CM | POA: Diagnosis not present

## 2016-06-09 LAB — BASIC METABOLIC PANEL
BUN: 36 mg/dL — ABNORMAL HIGH (ref 7–25)
CO2: 26 mmol/L (ref 20–31)
Calcium: 9.3 mg/dL (ref 8.6–10.4)
Chloride: 106 mmol/L (ref 98–110)
Creat: 0.89 mg/dL (ref 0.60–0.93)
Glucose, Bld: 79 mg/dL (ref 65–99)
Potassium: 4.1 mmol/L (ref 3.5–5.3)
Sodium: 141 mmol/L (ref 135–146)

## 2016-06-09 LAB — MAGNESIUM: Magnesium: 2.2 mg/dL (ref 1.5–2.5)

## 2016-06-09 NOTE — Patient Instructions (Signed)
Medication Instructions:  Your physician recommends that you continue on your current medications as directed. Please refer to the Current Medication list given to you today.   Labwork: Your physician recommends that you have lab work today: bmet/mag/tsh   Testing/Procedures: Your physician has requested that you have an echocardiogram. Echocardiography is a painless test that uses sound waves to create images of your heart. It provides your doctor with information about the size and shape of your heart and how well your heart's chambers and valves are working. This procedure takes approximately one hour. There are no restrictions for this procedure.  Your physician has recommended that you wear an event monitor. Event monitors are medical devices that record the heart's electrical activity. Doctors most often [place] these monitors to diagnose arrhythmias. Arrhythmias are problems with the speed or rhythm of the heartbeat. The monitor is a small, portable device. You can wear one while you do your normal daily activities. This is usually used to diagnose what is causing palpitations/syncope (passing out).    Follow-Up: Your physician recommends that you keep your scheduled  follow-up appointment with Cecilie Kicks, NP, on a day Dr.Ross is in the office You have been referred to the lipid clinic, please keep that appointment.   Any Other Special Instructions Will Be Listed Below (If Applicable).     If you need a refill on your cardiac medications before your next appointment, please call your pharmacy.

## 2016-06-09 NOTE — Progress Notes (Signed)
Cardiology Office Note   Date:  06/09/2016   ID:  Sharia, Boda [birth year], MRN [id number]  PCP:  Cathlean Cower, MD  Cardiologist:  Dr. Harrington Challenger    Chief Complaint  Patient presents with  . Bradycardia    fatigue      History of Present Illness: ALOHA KNIERIEM is a 75 y.o. female who presents for bradycardia, which is actually bigeminy PVCs. But also tachycardia at times associated with SOB.  No chest pain.    Dr. Harrington Challenger saw her about a year ago for lipid Rx she had seen her several years ago as well. She has failed 3 statins due to muscle aches Lipids in Dec LDL was 241 Trig 412 She admits to eating terribly the month before Is changing diet- she is now on low carb diet and her cholesterol has improved along with LDL but still elevated.  She is not sure her insurance will pay for PCSK-9.  She agrees to see Gay Filler Earl= pharmD in lipid clinic as well.    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 She is on chronic Coumadin. TEE 12/03: EF 55-65%, trace AI, mild MR, negative bubble study, positive atrial septal aneurysm. She also has a history of hypertension, hyperlipidemia, GERD and COPD as well as venous insufficiency, status post prior ablative therapy by Dr. Donnetta Hutching. It has been noted in the past that she definitely needs cross bridging therapy anytime that she comes off of Coumadin.  She has had several surgeries, most recent was back surgery.  She is now in rehab and with walking her HR goes to at least 135 and she is SOB.  She has to stop and rest.  No chest pain.  She also has this with walking in the hall at home.  On appt with Pulmonary yesterday she was noted to have HR 40s but EKG revealed 71.  So she was asked to see Cardiology.  Today she is in [place] and she states she has had irregular beats for years.  Looking back to EKG 2013 she does PVCs trigeminy.  Pt is tired but no acute problems when her HR was thought to  be 40s.  She has no lightheadedness or dizziness.     The tachycardia and SOB is her greatest complaint.   Past Medical History  Diagnosis Date  . COPD (chronic obstructive pulmonary disease) ([place])   . GERD (gastroesophageal reflux disease)   . HLD (hyperlipidemia)   . Osteoarthritis   . Hemorrhoids   . History of cerebrovascular accident   . TIA (transient ischemic attack) last 2001    anticoagulation therapy on coumadin  . HTN (hypertension)   . DI (detrusor instability)   . Status post dilation of esophageal narrowing   . Depression 11/29/2013  . Nephrolithiasis     hx  . Pneumonia 2013  . Shingles since Nov 18, 2013    on right arm from shoulder to wrist  . Dysrhythmia   . Anxiety   . History of kidney stones   . Basal cell carcinoma   . Allergic rhinitis   . Stroke [facility]) 2001    STROKES, TIA'S  . Shortness of breath dyspnea     WITH EXERTION   . Esophageal stricture   . Hiatal hernia   . Allergy   . Clotting disorder ([place])   . Cataract     bil cataracts removed  Past Surgical History  Procedure Laterality Date  . Bladder suspension    . Excision of basal cell ca  2011  . Thumb surgery Right 10-15 yrs ago  . Rectocele repair  2008    A&P REPAIR -DR. MCDIARMID  . Anterior and posterior vaginal repair  2008    A&P REPAIR-DR MCDIARMID  . Colonoscopy    . Varicose vein surgery    . Upper gastrointestinal endoscopy    . Esophagogastroduodenoscopy N/A 03/03/2014    Procedure: ESOPHAGOGASTRODUODENOSCOPY (EGD);  Surgeon: Inda Castle, MD;  Location: [facility];  Service: Endoscopy;  Laterality: N/A;  . Balloon dilation N/A 03/03/2014    Procedure: BALLOON DILATION;  Surgeon: Inda Castle, MD;  Location: [facility];  Service: Endoscopy;  Laterality: N/A;  . Anterior lateral lumbar fusion 4 levels Right 07/03/2015    Procedure: Right Lumbar one-two, Lumbar two-three, Lumbar three-four, Lumbar four-five Anterior lateral lumbar interbody fusion;  Surgeon:  Erline Levine, MD;  Location: [facility];  Service: Neurosurgery;  Laterality: Right;  Right L1-2 L2-3 L3-4 L4-5 Anterior lateral lumbar interbody fusion  . Lumbar percutaneous pedicle screw 4 level Right 07/03/2015    Procedure: Lumbar one-Sacral one Bilateral percutaneous pedicle screws;  Surgeon: Erline Levine, MD;  Location: [facility];  Service: Neurosurgery;  Laterality: Right;  L1-S1 Bilateral percutaneous pedicle screws  . Vaginal hysterectomy  1983    NO BSO-DR. MABRY  . Gluteus minimus repair Right 03/15/2016    Procedure: RIGHT HIP GLUTEUS MEDIUS TENDON REPAIR;  Surgeon: Paralee Cancel, MD;  Location: [facility];  Service: Orthopedics;  Laterality: Right;     Current Outpatient Prescriptions  Medication Sig Dispense Refill  . albuterol (PROVENTIL HFA;VENTOLIN HFA) 108 (90 BASE) MCG/ACT inhaler Inhale 1-2 puffs into the lungs every 6 (six) hours as needed for wheezing or shortness of breath. 3 Inhaler 4  . amLODipine (NORVASC) 5 MG tablet Take 1 tablet (5 mg total) by mouth every morning. 90 tablet 3  . azelastine (ASTELIN) 0.1 % nasal spray Place 2 sprays into the nose daily. Use in each nostril as directed 90 mL 1  . benzonatate (TESSALON) 100 MG capsule Take 1 capsule (100 mg total) by mouth 3 (three) times daily as needed for cough. 90 capsule 0  . budesonide-formoterol (SYMBICORT) 160-4.5 MCG/ACT inhaler Inhale 2 puffs into the lungs 2 (two) times daily. 3 Inhaler 4  . Calcium Carbonate-Vitamin D (CALCIUM + D PO) Take 1 tablet by mouth 2 (two) times daily.     . citalopram (CELEXA) 20 MG tablet Take 1 tablet (20 mg total) by mouth daily. 90 tablet 3  . cyclobenzaprine (FLEXERIL) 5 MG tablet Take 1 tablet (5 mg total) by mouth 3 (three) times daily as needed for muscle spasms. 270 tablet 0  . fluticasone (FLONASE) 50 MCG/ACT nasal spray Place 1 spray into both nostrils 2 (two) times daily. 48 g 1  . irbesartan (AVAPRO) 150 MG tablet Take 1 tablet (150 mg total) by mouth every morning. 30  tablet 0  . Lidocaine 2 % GEL Apply 1 application topically daily as needed (SHINGLES).    . Magnesium 250 MG TABS Take 1 tablet by mouth daily as needed. Leg cramps    . Melatonin 1 MG TABS Take 3 mg by mouth at bedtime.     . mirabegron ER (MYRBETRIQ) 50 MG TB24 tablet Take 50 mg by mouth daily.    . montelukast (SINGULAIR) 10 MG tablet Take 10 mg by mouth daily.    Marland Kitchen  Multiple Vitamin (MULTIVITAMIN) tablet Take 1 tablet by mouth daily.      Marland Kitchen omeprazole (PRILOSEC) 40 MG capsule Take 1 capsule (40 mg total) by mouth daily. 90 capsule 3  . Respiratory Therapy Supplies (FLUTTER) DEVI Use 2-3 times per day 1 each 0  . Spacer/Aero-Holding Chambers (AEROCHAMBER Z-STAT PLUS) inhaler Use as instructed 1 each 0  . Tiotropium Bromide Monohydrate (SPIRIVA RESPIMAT) 2.5 MCG/ACT AERS Inhale 2 puffs into the lungs daily. 3 Inhaler 3  . Vitamin Mixture (VITAMIN E COMPLETE PO) Take 1 capsule by mouth daily. 400iu    . warfarin (COUMADIN) 5 MG tablet Use along with Lovenox to obtain therapeutic INR. Daily INR to monitor levels and dosing adjusted by pharmacy to obtain and maintain therapeutic INR of 2-3. Last dose in the hospital was 7.5 mg. 40 tablet 0  . [DISCONTINUED] valsartan (DIOVAN) 160 MG tablet Take 1 tablet (160 mg total) by mouth daily. 90 tablet 3   No current facility-administered medications for this visit.    Allergies:   Clarithromycin; Lipitor; Simvastatin; and Crestor    Social History:  The patient  reports that she quit smoking about 20 years ago. Her smoking use included Cigarettes. She started smoking about 61 years ago. She has a 126 pack-year smoking history. She has never used smokeless tobacco. She reports that she drinks alcohol. She reports that she does not use illicit drugs.   Family History:  The patient's family history includes Breast cancer in her maternal aunt and mother; COPD in her mother; Emphysema in her mother; Uterine cancer in her mother. There is no history of Colon  cancer, Esophageal cancer, Rectal cancer, Stomach cancer, or Pancreatic cancer.    ROS:  General:no colds or fevers, no weight changes Skin:no rashes or ulcers HEENT:no blurred vision, no congestion CV:see HPI PUL:see HPI GI:no diarrhea constipation or melena, no indigestion GU:no hematuria, no dysuria MS:no joint pain, no claudication Neuro:no syncope, no lightheadedness Endo:no diabetes, no thyroid disease  Wt Readings from Last 3 Encounters:  06/09/16 147 lb 12.8 oz (67.042 kg)  06/01/16 146 lb (66.225 kg)  05/25/16 147 lb (66.679 kg)     PHYSICAL EXAM: VS:  BP 130/60 mmHg  Pulse 91  Ht 5\' 4"  (1.626 m)  Wt 147 lb 12.8 oz (67.042 kg)  BMI 25.36 kg/m2  LMP  (Exact Date) , BMI Body mass index is 25.36 kg/(m^2). General:Pleasant affect, NAD Skin:Warm and dry, brisk capillary refill HEENT:normocephalic, sclera clear, mucus membranes moist Neck:supple, no JVD, no bruits  Heart:S1S2 RRR with 2/6 systolic murmur, no gallup, rub or click Lungs:clear without rales, rhonchi, or wheezes [id number], non tender, + BS, do not palpate liver spleen or masses Ext:no lower ext edema, 2+ pedal pulses, 2+ radial pulses Neuro:alert and oriented, MAE, follows commands, + facial symmetry    EKG:  EKG is ordered today. The ekg ordered today demonstrates SR with bigeminy PVCs.  No acute changes.    Recent Labs: 11/12/2015: ALT 14; TSH 1.43 03/16/2016: BUN 27*; Creatinine, Ser 0.97; Hemoglobin 11.0*; Platelets 130*; Potassium 4.6; Sodium 138    Lipid Panel    Component Value Date/Time   CHOL 239* 11/12/2015 1142   TRIG 122.0 11/12/2015 1142   HDL 49.70 11/12/2015 1142   CHOLHDL 5 11/12/2015 1142   VLDL 24.4 11/12/2015 1142   LDLCALC 165* 11/12/2015 1142   LDLDIRECT 241.1 12/02/2014 1528       Other studies Reviewed: Additional studies/ records that were reviewed today include: ECHO 2007  SUMMARY -  Overall left ventricular systolic function was normal. Left     ventricular ejection fraction was estimated , range being 55    % to 65 %. There were no left ventricular regional wall    motion abnormalities. - There was trivial aortic valvular regurgitation. 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 - There was mild mitral valvular regurgitation. - No intra-atrial septal defect noted by color. Negative saline    microcavitaiton study. There was no left atrial appendage    thrombus identified. There was an atrial septal aneurysm.  ASSESSMENT AND PLAN:  1. Bradycardia- actually Bigeminy  PVCs.    2. Tachycardial associated with SOB- will check labs today and plan for 2 week event monitor -she has symptoms most days and Echo - I do hear murmur.   3. HL Profound Patient has been on low carb diet and numbers have improved. But she needs Rx but better to get a good baseline  Has seen Elberta Leatherwood in lipid clinic- will have her follow up  consider Rx with PCSK9 inhibitor  11/2015 LDL 165   4. Hx CVA Records are in previous record system Difficult to retrieve I did review TEE and she has an atrial septal aneurysm. Has been maintained on coumadin She should have Lovenox bridging I have forwarded records to Coumadin clinic for review WIll check INR today   5. HTN stable.   6. anticoagulation with coumadin. Due to CVA     Current medicines are reviewed with the patient today.  The patient Has no concerns regarding medicines.  The following changes have been made:  See above Labs/ tests ordered today include:see above  Disposition:   FU:  see above  Signed, Cecilie Kicks, NP  06/09/2016 1:47 PM    [facility] [place], [place], [address] [place], [place] Phone: [phone]; Fax: [phone]

## 2016-06-10 DIAGNOSIS — S76311D Strain of muscle, fascia and tendon of the posterior muscle group at thigh level, right thigh, subsequent encounter: Secondary | ICD-10-CM | POA: Diagnosis not present

## 2016-06-10 DIAGNOSIS — Z4789 Encounter for other orthopedic aftercare: Secondary | ICD-10-CM | POA: Diagnosis not present

## 2016-06-10 LAB — TSH: TSH: 1.56 mIU/L

## 2016-06-13 ENCOUNTER — Other Ambulatory Visit: Payer: Self-pay | Admitting: Cardiology

## 2016-06-13 DIAGNOSIS — M25551 Pain in right hip: Secondary | ICD-10-CM | POA: Diagnosis not present

## 2016-06-13 DIAGNOSIS — R0602 Shortness of breath: Secondary | ICD-10-CM

## 2016-06-13 DIAGNOSIS — S76311D Strain of muscle, fascia and tendon of the posterior muscle group at thigh level, right thigh, subsequent encounter: Secondary | ICD-10-CM | POA: Diagnosis not present

## 2016-06-13 DIAGNOSIS — Z4789 Encounter for other orthopedic aftercare: Secondary | ICD-10-CM | POA: Diagnosis not present

## 2016-06-13 DIAGNOSIS — R000 Tachycardia, unspecified: Secondary | ICD-10-CM

## 2016-06-13 DIAGNOSIS — I493 Ventricular premature depolarization: Secondary | ICD-10-CM

## 2016-06-14 ENCOUNTER — Ambulatory Visit (INDEPENDENT_AMBULATORY_CARE_PROVIDER_SITE_OTHER): Payer: Medicare Other

## 2016-06-14 DIAGNOSIS — R000 Tachycardia, unspecified: Secondary | ICD-10-CM

## 2016-06-14 DIAGNOSIS — R0602 Shortness of breath: Secondary | ICD-10-CM

## 2016-06-14 DIAGNOSIS — I493 Ventricular premature depolarization: Secondary | ICD-10-CM

## 2016-06-15 DIAGNOSIS — S76311D Strain of muscle, fascia and tendon of the posterior muscle group at thigh level, right thigh, subsequent encounter: Secondary | ICD-10-CM | POA: Diagnosis not present

## 2016-06-15 DIAGNOSIS — Z4789 Encounter for other orthopedic aftercare: Secondary | ICD-10-CM | POA: Diagnosis not present

## 2016-06-15 DIAGNOSIS — M25551 Pain in right hip: Secondary | ICD-10-CM | POA: Diagnosis not present

## 2016-06-17 DIAGNOSIS — S76311D Strain of muscle, fascia and tendon of the posterior muscle group at thigh level, right thigh, subsequent encounter: Secondary | ICD-10-CM | POA: Diagnosis not present

## 2016-06-17 DIAGNOSIS — Z4789 Encounter for other orthopedic aftercare: Secondary | ICD-10-CM | POA: Diagnosis not present

## 2016-06-17 DIAGNOSIS — M25551 Pain in right hip: Secondary | ICD-10-CM | POA: Diagnosis not present

## 2016-06-20 DIAGNOSIS — M25551 Pain in right hip: Secondary | ICD-10-CM | POA: Diagnosis not present

## 2016-06-20 DIAGNOSIS — S76311D Strain of muscle, fascia and tendon of the posterior muscle group at thigh level, right thigh, subsequent encounter: Secondary | ICD-10-CM | POA: Diagnosis not present

## 2016-06-20 DIAGNOSIS — Z4789 Encounter for other orthopedic aftercare: Secondary | ICD-10-CM | POA: Diagnosis not present

## 2016-06-21 DIAGNOSIS — Z4789 Encounter for other orthopedic aftercare: Secondary | ICD-10-CM | POA: Diagnosis not present

## 2016-06-21 DIAGNOSIS — S76311D Strain of muscle, fascia and tendon of the posterior muscle group at thigh level, right thigh, subsequent encounter: Secondary | ICD-10-CM | POA: Diagnosis not present

## 2016-06-21 DIAGNOSIS — M25551 Pain in right hip: Secondary | ICD-10-CM | POA: Diagnosis not present

## 2016-06-22 ENCOUNTER — Ambulatory Visit (INDEPENDENT_AMBULATORY_CARE_PROVIDER_SITE_OTHER): Payer: Medicare Other | Admitting: General Practice

## 2016-06-22 ENCOUNTER — Ambulatory Visit: Payer: Medicare Other

## 2016-06-22 ENCOUNTER — Telehealth: Payer: Self-pay

## 2016-06-22 DIAGNOSIS — Z7901 Long term (current) use of anticoagulants: Secondary | ICD-10-CM

## 2016-06-22 DIAGNOSIS — G459 Transient cerebral ischemic attack, unspecified: Secondary | ICD-10-CM

## 2016-06-22 LAB — POCT INR: INR: 3.2

## 2016-06-22 NOTE — Progress Notes (Signed)
I have reviewed and agree with the plan. 

## 2016-06-22 NOTE — Progress Notes (Signed)
Pre visit review using our clinic review tool, if applicable. No additional management support is needed unless otherwise documented below in the visit note. 

## 2016-06-22 NOTE — Telephone Encounter (Signed)
Left message for patient to call back about her monitor. Report received of an event this morning at 5:21 AM (CT).   Called patient on her mobile phone. Patient stated that she was either just sitting at the kitchen table or doing an exercise for her right hip rehab, standing on one foot. Patient stated she if feeling fine and has no complaints or concerns at this time.  Will have DOD review rhythm.

## 2016-06-23 DIAGNOSIS — S76311D Strain of muscle, fascia and tendon of the posterior muscle group at thigh level, right thigh, subsequent encounter: Secondary | ICD-10-CM | POA: Diagnosis not present

## 2016-06-23 DIAGNOSIS — M25551 Pain in right hip: Secondary | ICD-10-CM | POA: Diagnosis not present

## 2016-06-23 DIAGNOSIS — Z4789 Encounter for other orthopedic aftercare: Secondary | ICD-10-CM | POA: Diagnosis not present

## 2016-06-24 ENCOUNTER — Other Ambulatory Visit: Payer: Self-pay

## 2016-06-24 ENCOUNTER — Ambulatory Visit (HOSPITAL_COMMUNITY): Payer: Medicare Other | Attending: Cardiovascular Disease

## 2016-06-24 DIAGNOSIS — I517 Cardiomegaly: Secondary | ICD-10-CM | POA: Diagnosis not present

## 2016-06-24 DIAGNOSIS — R0602 Shortness of breath: Secondary | ICD-10-CM | POA: Insufficient documentation

## 2016-06-24 DIAGNOSIS — E785 Hyperlipidemia, unspecified: Secondary | ICD-10-CM | POA: Diagnosis not present

## 2016-06-24 DIAGNOSIS — R011 Cardiac murmur, unspecified: Secondary | ICD-10-CM | POA: Insufficient documentation

## 2016-06-24 DIAGNOSIS — I493 Ventricular premature depolarization: Secondary | ICD-10-CM | POA: Diagnosis not present

## 2016-06-24 DIAGNOSIS — R000 Tachycardia, unspecified: Secondary | ICD-10-CM | POA: Diagnosis not present

## 2016-06-24 DIAGNOSIS — J449 Chronic obstructive pulmonary disease, unspecified: Secondary | ICD-10-CM | POA: Insufficient documentation

## 2016-06-24 DIAGNOSIS — I340 Nonrheumatic mitral (valve) insufficiency: Secondary | ICD-10-CM | POA: Insufficient documentation

## 2016-06-24 DIAGNOSIS — S50811A Abrasion of right forearm, initial encounter: Secondary | ICD-10-CM | POA: Diagnosis not present

## 2016-06-28 DIAGNOSIS — S76311D Strain of muscle, fascia and tendon of the posterior muscle group at thigh level, right thigh, subsequent encounter: Secondary | ICD-10-CM | POA: Diagnosis not present

## 2016-06-28 DIAGNOSIS — Z4789 Encounter for other orthopedic aftercare: Secondary | ICD-10-CM | POA: Diagnosis not present

## 2016-06-28 DIAGNOSIS — M25551 Pain in right hip: Secondary | ICD-10-CM | POA: Diagnosis not present

## 2016-06-30 ENCOUNTER — Encounter: Payer: Self-pay | Admitting: Cardiology

## 2016-07-01 DIAGNOSIS — Z4789 Encounter for other orthopedic aftercare: Secondary | ICD-10-CM | POA: Diagnosis not present

## 2016-07-01 DIAGNOSIS — S76311D Strain of muscle, fascia and tendon of the posterior muscle group at thigh level, right thigh, subsequent encounter: Secondary | ICD-10-CM | POA: Diagnosis not present

## 2016-07-01 DIAGNOSIS — M25551 Pain in right hip: Secondary | ICD-10-CM | POA: Diagnosis not present

## 2016-07-04 ENCOUNTER — Telehealth: Payer: Self-pay | Admitting: Pharmacist

## 2016-07-04 DIAGNOSIS — S76311D Strain of muscle, fascia and tendon of the posterior muscle group at thigh level, right thigh, subsequent encounter: Secondary | ICD-10-CM | POA: Diagnosis not present

## 2016-07-04 DIAGNOSIS — M25551 Pain in right hip: Secondary | ICD-10-CM | POA: Diagnosis not present

## 2016-07-04 DIAGNOSIS — Z4789 Encounter for other orthopedic aftercare: Secondary | ICD-10-CM | POA: Diagnosis not present

## 2016-07-04 NOTE — Telephone Encounter (Signed)
Pt was scheduled for Lipid Clinic appt to discuss PCKS-9 inhibitors.  I saw pt in April 2016 and discussed therapy.  She has [employer] and they do not cover this therapy.  Called pt to discuss options.  Her last LDL was drawn in Dec 2016.  Discussed clinical trial options versus starting Zetia.  Pt is agreeable to trying zetia.  Will send in Rx for Zetia 10mg  daily.  Pt has follow up with Cecilie Kicks, NP on 8/24.  If pt is tolerating therapy, will need to recheck labs in October.

## 2016-07-05 ENCOUNTER — Ambulatory Visit: Payer: Medicare Other | Admitting: Pharmacist

## 2016-07-08 DIAGNOSIS — Z4789 Encounter for other orthopedic aftercare: Secondary | ICD-10-CM | POA: Diagnosis not present

## 2016-07-08 DIAGNOSIS — M25551 Pain in right hip: Secondary | ICD-10-CM | POA: Diagnosis not present

## 2016-07-08 DIAGNOSIS — S76311D Strain of muscle, fascia and tendon of the posterior muscle group at thigh level, right thigh, subsequent encounter: Secondary | ICD-10-CM | POA: Diagnosis not present

## 2016-07-11 DIAGNOSIS — S76311D Strain of muscle, fascia and tendon of the posterior muscle group at thigh level, right thigh, subsequent encounter: Secondary | ICD-10-CM | POA: Diagnosis not present

## 2016-07-11 DIAGNOSIS — M25551 Pain in right hip: Secondary | ICD-10-CM | POA: Diagnosis not present

## 2016-07-11 DIAGNOSIS — Z4789 Encounter for other orthopedic aftercare: Secondary | ICD-10-CM | POA: Diagnosis not present

## 2016-07-12 ENCOUNTER — Telehealth: Payer: Self-pay | Admitting: Cardiology

## 2016-07-12 NOTE — Telephone Encounter (Signed)
Pt calling stating she has not heard from echo results from 2 wks ago. Advised of Echo results and that Sandria Senter wanted Dr. Harrington Challenger to review to decide if she needed to have cath or stress test.  Advised that Dr. Harrington Challenger has not been in the office.  She has an appointment on 8/24 with Mickel Baas but advised if Dr. Harrington Challenger reviews echo and wants other tests to be done someone will get back in touch with her. She denies having CP but states she is SOB especially at Rehab.  States hard to get thru Rehab without becoming "short winded". She verbalizes understanding that someone will get back in touch with her soon.

## 2016-07-12 NOTE — Telephone Encounter (Signed)
New message   Pt verbalized that she is returning call for rn  Pt need to know why she received a call to be seen earlier and she said she has not heard anything back for the results of her test that she has two weeks ago

## 2016-07-13 ENCOUNTER — Telehealth: Payer: Self-pay | Admitting: Internal Medicine

## 2016-07-13 DIAGNOSIS — M25551 Pain in right hip: Secondary | ICD-10-CM | POA: Diagnosis not present

## 2016-07-13 DIAGNOSIS — Z4789 Encounter for other orthopedic aftercare: Secondary | ICD-10-CM | POA: Diagnosis not present

## 2016-07-13 DIAGNOSIS — S76311D Strain of muscle, fascia and tendon of the posterior muscle group at thigh level, right thigh, subsequent encounter: Secondary | ICD-10-CM | POA: Diagnosis not present

## 2016-07-13 NOTE — Telephone Encounter (Signed)
Thank you :)

## 2016-07-13 NOTE — Telephone Encounter (Signed)
Follow up       Pt is calling stating that she is having rehab on her hip----walking on the treadmill and being sob.  She was told that she needs a cath. Is it ok for her to continue rehab?  Pt is worried and want to talk to someone today please.

## 2016-07-13 NOTE — Telephone Encounter (Signed)
Reviewed with Mickel Baas, who discussed with Dr. Johnsie Cancel (DOD).  Per Mickel Baas, "her echo was a little different than previously but what it shows is not causing her symptoms.  If she feels ok, then it is fine to wait until 8/24 to be seen."  Spoke with the patient.  She wants to make sure she is not hurting her heart by doing hip rehab.  She becomes very fatigued, "no stamina" and gets out of breath walking very slowly on the treadmill or doing the exercises.  Denies chest pain.  States O2 level stays good until she gets very fatigued then it drops.   Pt has COPD.    She has been active all her life but had back surgery a year ago and had just started back exercising in Oct and tore the muscle in her hip.    Reviewed that her PVCs could be causing her SOB and to continue her rehab as she has been doing but stop and take rest breaks for SOB or fatigue.  Advised to call if chest pain develops.  She is aware that I will discuss with Mickel Baas, and will call her back with any new recommendations.   Reviewed with Mickel Baas who is in agreement with this plan.

## 2016-07-13 NOTE — Telephone Encounter (Signed)
Mrs. Rude is calling because she is upset about some test results she received on yesterday and is wanting to speak to a nurse . Please call   Thanks

## 2016-07-15 DIAGNOSIS — S76011D Strain of muscle, fascia and tendon of right hip, subsequent encounter: Secondary | ICD-10-CM | POA: Diagnosis not present

## 2016-07-15 DIAGNOSIS — Z4789 Encounter for other orthopedic aftercare: Secondary | ICD-10-CM | POA: Diagnosis not present

## 2016-07-18 ENCOUNTER — Encounter: Payer: Self-pay | Admitting: Cardiology

## 2016-07-19 ENCOUNTER — Ambulatory Visit (INDEPENDENT_AMBULATORY_CARE_PROVIDER_SITE_OTHER): Payer: Medicare Other | Admitting: General Practice

## 2016-07-19 ENCOUNTER — Ambulatory Visit (INDEPENDENT_AMBULATORY_CARE_PROVIDER_SITE_OTHER): Payer: Medicare Other | Admitting: Pulmonary Disease

## 2016-07-19 ENCOUNTER — Encounter: Payer: Self-pay | Admitting: Pulmonary Disease

## 2016-07-19 VITALS — BP 134/64 | HR 81 | Ht 64.0 in | Wt 149.0 lb

## 2016-07-19 DIAGNOSIS — J309 Allergic rhinitis, unspecified: Secondary | ICD-10-CM

## 2016-07-19 DIAGNOSIS — J449 Chronic obstructive pulmonary disease, unspecified: Secondary | ICD-10-CM | POA: Diagnosis not present

## 2016-07-19 DIAGNOSIS — G459 Transient cerebral ischemic attack, unspecified: Secondary | ICD-10-CM | POA: Diagnosis not present

## 2016-07-19 DIAGNOSIS — K219 Gastro-esophageal reflux disease without esophagitis: Secondary | ICD-10-CM

## 2016-07-19 DIAGNOSIS — Z5181 Encounter for therapeutic drug level monitoring: Secondary | ICD-10-CM

## 2016-07-19 DIAGNOSIS — R059 Cough, unspecified: Secondary | ICD-10-CM

## 2016-07-19 DIAGNOSIS — R05 Cough: Secondary | ICD-10-CM

## 2016-07-19 DIAGNOSIS — I639 Cerebral infarction, unspecified: Secondary | ICD-10-CM

## 2016-07-19 LAB — POCT INR: INR: 4.5

## 2016-07-19 MED ORDER — PREDNISONE 20 MG PO TABS: 40.0000 mg | ORAL_TABLET | Freq: Every day | ORAL | 0 refills | Status: DC

## 2016-07-19 NOTE — Patient Instructions (Signed)
   Use your albuterol inhaler every 4 hours to help suppress your cough.  Use the Tessalon Perles (Benzonatate) to help with your cough as prescribed. Call me if you need to have any refills.  Call me if your cough doesn't get better in a couple of days.  We will keep your appointments as scheduled in October to see me and for your breathing as well as walk tests.

## 2016-07-19 NOTE — Progress Notes (Signed)
I have reviewed and agree with the plan. 

## 2016-07-19 NOTE — Progress Notes (Signed)
Subjective:    Patient ID: [name], female    DOB: [birth year], 75 y.o.   MRN: [id number]  C.C.: Acute visit for Cough with known Moderate COPD, Allergic Rhinitis, & GERD.  HPI  Cough:  Patient seen for cough at last appointment. Treated with short course of prednisone and doxycycline. She reports her current nonproductive cough started on Sunday. She denies any recent sick contacts. Denies any sore throat.   Moderate COPD:  Currently on Symbicort & Spiriva. She reports compliance. She reports she has continued to have dyspnea and fatigue with her rehab. She is avoiding going outdoors with increased heat & humidity. She is using her rescue inhaler intermittent and does feel it helps with her dyspnea.   Allergic Rhinitis: Continued on Singulair at last appointment. Held Astelin, Flonase, Singulair, & nasal saline rinse at last appointment which has been restarted. Denies any sinus congestion, pressure, or drainage.   GERD:  She is following with Dr. Kathrine Cords. Underwent EGD with dilation of esophagus on 04/14/16. On Prilosec. Reports she does have intermittent dysphagia. Denies any reflux or dyspepsia.   Review of Systems  She reports she is having intermittent palpitations and tightness. Denies any overt chest pain. She has had increased edema in her legs. No fever or chills. Does have occasional hot flashes.   Allergies  Allergen Reactions  . Clarithromycin Other (See Comments)    REACTION: possible rash but could have been legionarres dz with rash  . Lipitor [Atorvastatin] Other (See Comments)    MUSCLE SPASMS  . Simvastatin Other (See Comments)    Muscle and joint pain  . Crestor [Rosuvastatin Calcium]     fagitue and joint pain, "NO STATINS"    Current Outpatient Prescriptions on File Prior to Visit  Medication Sig Dispense Refill  . albuterol (PROVENTIL HFA;VENTOLIN HFA) 108 (90 BASE) MCG/ACT inhaler Inhale 1-2 puffs into the lungs every 6 (six) hours as needed for wheezing  or shortness of breath. 3 Inhaler 4  . amLODipine (NORVASC) 5 MG tablet Take 1 tablet (5 mg total) by mouth every morning. 90 tablet 3  . azelastine (ASTELIN) 0.1 % nasal spray Place 2 sprays into the nose daily. Use in each nostril as directed 90 mL 1  . benzonatate (TESSALON) 100 MG capsule Take 1 capsule (100 mg total) by mouth 3 (three) times daily as needed for cough. 90 capsule 0  . budesonide-formoterol (SYMBICORT) 160-4.5 MCG/ACT inhaler Inhale 2 puffs into the lungs 2 (two) times daily. 3 Inhaler 4  . Calcium Carbonate-Vitamin D (CALCIUM + D PO) Take 1 tablet by mouth 2 (two) times daily.     . citalopram (CELEXA) 20 MG tablet Take 1 tablet (20 mg total) by mouth daily. 90 tablet 3  . cyclobenzaprine (FLEXERIL) 5 MG tablet Take 1 tablet (5 mg total) by mouth 3 (three) times daily as needed for muscle spasms. 270 tablet 0  . fluticasone (FLONASE) 50 MCG/ACT nasal spray Place 1 spray into both nostrils 2 (two) times daily. 48 g 1  . irbesartan (AVAPRO) 150 MG tablet Take 1 tablet (150 mg total) by mouth every morning. 30 tablet 0  . Lidocaine 2 % GEL Apply 1 application topically daily as needed (SHINGLES).    . Magnesium 250 MG TABS Take 1 tablet by mouth daily as needed. Leg cramps    . Melatonin 1 MG TABS Take 3 mg by mouth at bedtime.     . mirabegron ER (MYRBETRIQ) 50 MG TB24 tablet Take 50  mg by mouth daily.    . montelukast (SINGULAIR) 10 MG tablet Take 10 mg by mouth daily.    . Multiple Vitamin (MULTIVITAMIN) tablet Take 1 tablet by mouth daily.      Marland Kitchen omeprazole (PRILOSEC) 40 MG capsule Take 1 capsule (40 mg total) by mouth daily. 90 capsule 3  . Respiratory Therapy Supplies (FLUTTER) DEVI Use 2-3 times per day 1 each 0  . Spacer/Aero-Holding Chambers (AEROCHAMBER Z-STAT PLUS) inhaler Use as instructed 1 each 0  . Tiotropium Bromide Monohydrate (SPIRIVA RESPIMAT) 2.5 MCG/ACT AERS Inhale 2 puffs into the lungs daily. 3 Inhaler 3  . Vitamin Mixture (VITAMIN E COMPLETE PO) Take 1  capsule by mouth daily. 400iu    . warfarin (COUMADIN) 5 MG tablet Use along with Lovenox to obtain therapeutic INR. Daily INR to monitor levels and dosing adjusted by pharmacy to obtain and maintain therapeutic INR of 2-3. Last dose in the hospital was 7.5 mg. 40 tablet 0  . [DISCONTINUED] valsartan (DIOVAN) 160 MG tablet Take 1 tablet (160 mg total) by mouth daily. 90 tablet 3   No current facility-administered medications on file prior to visit.     Past Medical History:  Diagnosis Date  . Allergic rhinitis   . Allergy   . Anxiety   . Basal cell carcinoma   . Cataract    bil cataracts removed  . Clotting disorder ([place])   . COPD (chronic obstructive pulmonary disease) ([place])   . Depression 11/29/2013  . DI (detrusor instability)   . Dysrhythmia   . Esophageal stricture   . GERD (gastroesophageal reflux disease)   . Hemorrhoids   . Hiatal hernia   . History of cerebrovascular accident   . History of kidney stones   . HLD (hyperlipidemia)   . HTN (hypertension)   . Nephrolithiasis    hx  . Osteoarthritis   . Pneumonia 2013  . Shingles since Nov 18, 2013   on right arm from shoulder to wrist  . Shortness of breath dyspnea    WITH EXERTION   . Status post dilation of esophageal narrowing   . Stroke [facility]) 2001   STROKES, TIA'S  . TIA (transient ischemic attack) last 2001   anticoagulation therapy on coumadin    Past Surgical History:  Procedure Laterality Date  . ANTERIOR AND POSTERIOR VAGINAL REPAIR  2008   A&P REPAIR-DR MCDIARMID  . ANTERIOR LATERAL LUMBAR FUSION 4 LEVELS Right 07/03/2015   Procedure: Right Lumbar one-two, Lumbar two-three, Lumbar three-four, Lumbar four-five Anterior lateral lumbar interbody fusion;  Surgeon: Erline Levine, MD;  Location: [place] [facility];  Service: Neurosurgery;  Laterality: Right;  Right L1-2 L2-3 L3-4 L4-5 Anterior lateral lumbar interbody fusion  . BALLOON DILATION N/A 03/03/2014   Procedure: BALLOON DILATION;  Surgeon: Inda Castle,  MD;  [facility];  Service: Endoscopy;  Laterality: N/A;  . BLADDER SUSPENSION    . COLONOSCOPY    . ESOPHAGOGASTRODUODENOSCOPY N/A 03/03/2014   Procedure: ESOPHAGOGASTRODUODENOSCOPY (EGD);  Surgeon: Inda Castle, MD;  [facility];  Service: Endoscopy;  Laterality: N/A;  . EXCISION OF BASAL CELL CA  2011  . GLUTEUS MINIMUS REPAIR Right 03/15/2016   Procedure: RIGHT HIP GLUTEUS MEDIUS TENDON REPAIR;  Surgeon: Paralee Cancel, MD;  [facility];  Service: Orthopedics;  Laterality: Right;  . LUMBAR PERCUTANEOUS PEDICLE SCREW 4 LEVEL Right 07/03/2015   Procedure: Lumbar one-Sacral one Bilateral percutaneous pedicle screws;  Surgeon: Erline Levine, MD;  Location: [place] [facility];  Service:  Neurosurgery;  Laterality: Right;  L1-S1 Bilateral percutaneous pedicle screws  . RECTOCELE REPAIR  2008   A&P REPAIR -DR. MCDIARMID  . THUMB SURGERY Right 10-15 yrs ago  . UPPER GASTROINTESTINAL ENDOSCOPY    . VAGINAL HYSTERECTOMY  1983   NO BSO-DR. MABRY  . VARICOSE VEIN SURGERY      Family History  Problem Relation Age of Onset  . COPD Mother   . Breast cancer Mother     mid 24's  . Uterine cancer Mother   . Emphysema Mother   . Breast cancer Maternal Aunt     x 3 aunts, post menopausal  . Colon cancer Neg Hx   . Esophageal cancer Neg Hx   . Rectal cancer Neg Hx   . Stomach cancer Neg Hx   . Pancreatic cancer Neg Hx     Social History   Social History  . Marital status: Married    Spouse name: N/A  . Number of children: 2  . Years of education: N/A   Occupational History  . retired Retired   Social History Main Topics  . Smoking status: Former Smoker    Packs/day: 3.00    Years: 42.00    Types: Cigarettes    Start date: 08/04/1954    Quit date: 12/06/1995  . Smokeless tobacco: Never Used  . Alcohol use 0.0 oz/week     Comment: occas very little  . Drug use: No  . Sexual activity: Not Currently    Birth control/ protection: Surgical     Comment: HYST-1st  intercourse 43 yo-2 partners   Other Topics Concern  . None   Social History Narrative   Retired- Dentist Pulmonary:   Originally from [place]. Has always lived in [place]. She has traveled to [place], [place], [place], [place], [place], [place]. Has been on multiple cruises. 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 She did use a face mask consistently to avoid chemical fume exposure. She may have only had an inhaled exposure a couple of times. She currently has a dog. Remote exposure to a parakeet. No mold or hot tub exposure. No asbestos exposure.       Objective:   Physical Exam BP 134/64 (BP Location: Left Arm, Cuff Size: Normal)   Pulse 81   Ht 5\' 4"  (1.626 m)   Wt 149 lb (67.6 kg)   LMP  (Exact Date)   SpO2 92%   BMI 25.58 kg/m  General:  Awake. Alert. Accompanied by husband.Comfortable. Integument:  Warm & dry. No rash on exposed skin.  HEENT:  No scleral icterus. No nasal turbinate swelling. Moist mucous membranes. Cardiovascular:  Regular rate with occasional premature beat. No edema. Normal S1 & S2. Pulmonary:  Continues to have intermittent nonproductive cough. Good aeration bilaterally. Overall clear on auscultation. Musculoskeletal:  Normal bulk and tone. No joint deformity or effusion appreciated.  PFT 03/01/16: FVC 2.55 L (91%) FEV1 1.30 L (62%) FEV1/FVC 0.51 FEF 25-75 0.56 L (34%) no bronchodilator response TLC 5.66 L (111%) RV 116% ERV 133% DLCO uncorrected 62% (hemoglobin 15.1) 12/28/11:FVC 2.89 L (104%) FEV1 1.22 L (62%) FEV1/FVC 0.42 FEF 25-75 0.33 L (15%) no bronchodilator response TLC 6.19 L (129%) RV 163% ERV 114% DLCO uncorrected 69%  IMAGING CXR PA/LAT 05/25/16 (personally reviewed by me): Hyperinflation with flattening of the diaphragms. No parenchymal opacity or mass appreciated. No pleural effusion. Heart normal in size &  mediastinum normal in  contour.  CXR PA/LAT 03/22/16 (previously reviewed by me):  Hyperinflation with elevation of left hemidiaphragm. No new focal opacity. Heart normal in size & mediastinum normal in contour.  CXR PA/LAT 11/09/15 (previously reviewed by me): No focal opacity or effusion appreciated. Heart normal in size. Mediastinum normal in contour. Mild hyperinflation with flattening of the diaphragms.   CARDIAC TTE (06/24/16): LVEF 45-50% with grade 2 diastolic dysfunction. LA moderately to severely dilated. RA normal in size. RV normal in size and function. No aortic stenosis or regurgitation. Moderate centrally directed mitral regurgitation. Paradoxical ventricular septal motion consistent with intraventricular conduction delay. No pulmonic regurgitation. No tricuspid regurgitation. No pericardial effusion.  LABS 12/22/15 Alpha-1 antitrypsin: MM (143)    Assessment & Plan:  75 y.o. female with underlying moderate COPD. Suspect patient's call today is secondary to her underlying COPD. Given symptomatic relief with her rescue inhaler and absence of infectious symptoms I do not feel that antibiotic therapy is necessary but she would likely benefit from steroid medication. I cautioned patient to notify me if she develops any new infectious symptoms or worsening cough as I would then provide her with empiric biotic coverage. Her reflux seems to be well-controlled at this time therefore I do not feel that additional medication is necessary. Additionally, her allergic rhinitis seems to be well-controlled having now resumed her intranasal medications.  1. Cough: Starting prednisone 40 mg by mouth daily 4 days. Further cough suppression with Tessalon Perles. 2. Moderate COPD: Continuing Symbicort & Spiriva. Patient instructed to use rescue inhaler every 4 hours for cough suppression. Patient will keep her appointments for 6 minute walk test and pulmonary function testing in October as scheduled. 3. Allergic Rhinitis:  Patient continuing on her current regimen of Singulair and intranasal medications. No changes. 4. GERD: Continuing Prilosec. Followed by GI. 5. Health aintenance: Prevnar December 2014 & Pneumovax January 2008. 6. Follow-up: Patient to return to clinic in October as previously scheduled.  Sonia Baller Ashok Cordia, M.D. [facility] Pulmonary & Critical Care Pager:  [phone] After 3pm or if no response, call [phone] 3:08 PM 07/19/16

## 2016-07-20 ENCOUNTER — Ambulatory Visit: Payer: Medicare Other

## 2016-07-21 ENCOUNTER — Encounter: Payer: Self-pay | Admitting: Cardiology

## 2016-07-22 ENCOUNTER — Telehealth: Payer: Self-pay | Admitting: Pulmonary Disease

## 2016-07-22 DIAGNOSIS — J441 Chronic obstructive pulmonary disease with (acute) exacerbation: Secondary | ICD-10-CM

## 2016-07-22 MED ORDER — PREDNISONE 10 MG PO TABS: ORAL_TABLET | ORAL | 0 refills | Status: DC

## 2016-07-22 NOTE — Telephone Encounter (Signed)
She should also be using her Albuterol inhaler 2 puffs every 4 hours while she's sick. Is she doing this? Is she having any fever, chills, or sweats?

## 2016-07-22 NOTE — Telephone Encounter (Signed)
Please order a sputum culture for AFB, Fungus, & Bacteria. Switch patient to Prednisone taper - 10mg  tablets - 6 tablets daily x 3 days, then 5 tablets daily x3 days, then 4 tablets x3 days, then 3 tablets x3 days, then 2 tablets x3 days, then 1 tablet x3 days.

## 2016-07-22 NOTE — Telephone Encounter (Signed)
Pt c/o increased cough and yellow thick mucus and some wheezing. Pt states that her chest in also congested. Pt currently taking prednisone 20mg . Pt states that she is supposed to finish Pred 07/23/16. Pt also using Tessalon Perles and Albuterol as directed - nothing seems to be working.  Please advise Dr Ashok Cordia. Thanks.

## 2016-07-22 NOTE — Telephone Encounter (Signed)
Called and spoke with pt and she stated that she is using the albuterol inhaler 2 puffs every 4 hours.  She is not having any fever, chills or sweats.  Please advise. thanks

## 2016-07-22 NOTE — Telephone Encounter (Signed)
Patient notified of Dr. Ammie Dalton recommendations. Cx has already been ordered. Rx sent to pharmacy. Nothing further needed.

## 2016-07-25 ENCOUNTER — Telehealth: Payer: Self-pay | Admitting: Pulmonary Disease

## 2016-07-25 DIAGNOSIS — J441 Chronic obstructive pulmonary disease with (acute) exacerbation: Secondary | ICD-10-CM

## 2016-07-25 MED ORDER — PREDNISONE 10 MG PO TABS: ORAL_TABLET | ORAL | 0 refills | Status: DC

## 2016-07-25 NOTE — Telephone Encounter (Signed)
Called spoke with pt. She reports her prednisone was not sent to [facility] but to the mail order pharmacy. I advised will call to cancel the RX sent to mail order. I have sent in new Rx into [facility].  Called mail order and was advised this has not been processed yet but once received they will cancel the Rx.  Called made pt and made aware. Nothing further needed.

## 2016-07-26 ENCOUNTER — Other Ambulatory Visit: Payer: Self-pay | Admitting: Pharmacist

## 2016-07-26 MED ORDER — EZETIMIBE 10 MG PO TABS: 10.0000 mg | ORAL_TABLET | Freq: Every day | ORAL | 3 refills | Status: DC

## 2016-07-27 ENCOUNTER — Emergency Department (HOSPITAL_COMMUNITY): Payer: Medicare Other

## 2016-07-27 ENCOUNTER — Inpatient Hospital Stay (HOSPITAL_COMMUNITY)
Admission: EM | Admit: 2016-07-27 | Discharge: 2016-08-02 | DRG: 291 | Disposition: A | Discharge status: Home / Self Care | Payer: Medicare Other | Attending: Internal Medicine | Admitting: Internal Medicine

## 2016-07-27 ENCOUNTER — Encounter (HOSPITAL_COMMUNITY): Payer: Self-pay | Admitting: Emergency Medicine

## 2016-07-27 DIAGNOSIS — J441 Chronic obstructive pulmonary disease with (acute) exacerbation: Secondary | ICD-10-CM | POA: Diagnosis not present

## 2016-07-27 DIAGNOSIS — J209 Acute bronchitis, unspecified: Secondary | ICD-10-CM | POA: Diagnosis present

## 2016-07-27 DIAGNOSIS — I493 Ventricular premature depolarization: Secondary | ICD-10-CM | POA: Diagnosis present

## 2016-07-27 DIAGNOSIS — I10 Essential (primary) hypertension: Secondary | ICD-10-CM | POA: Diagnosis present

## 2016-07-27 DIAGNOSIS — Z87891 Personal history of nicotine dependence: Secondary | ICD-10-CM

## 2016-07-27 DIAGNOSIS — I5040 Unspecified combined systolic (congestive) and diastolic (congestive) heart failure: Secondary | ICD-10-CM | POA: Diagnosis present

## 2016-07-27 DIAGNOSIS — G894 Chronic pain syndrome: Secondary | ICD-10-CM

## 2016-07-27 DIAGNOSIS — Z86718 Personal history of other venous thrombosis and embolism: Secondary | ICD-10-CM

## 2016-07-27 DIAGNOSIS — Z825 Family history of asthma and other chronic lower respiratory diseases: Secondary | ICD-10-CM | POA: Diagnosis not present

## 2016-07-27 DIAGNOSIS — G4700 Insomnia, unspecified: Secondary | ICD-10-CM | POA: Diagnosis present

## 2016-07-27 DIAGNOSIS — Z7951 Long term (current) use of inhaled steroids: Secondary | ICD-10-CM

## 2016-07-27 DIAGNOSIS — I110 Hypertensive heart disease with heart failure: Secondary | ICD-10-CM | POA: Diagnosis present

## 2016-07-27 DIAGNOSIS — I4891 Unspecified atrial fibrillation: Secondary | ICD-10-CM | POA: Diagnosis not present

## 2016-07-27 DIAGNOSIS — J9600 Acute respiratory failure, unspecified whether with hypoxia or hypercapnia: Secondary | ICD-10-CM

## 2016-07-27 DIAGNOSIS — R7989 Other specified abnormal findings of blood chemistry: Secondary | ICD-10-CM | POA: Diagnosis not present

## 2016-07-27 DIAGNOSIS — I5043 Acute on chronic combined systolic (congestive) and diastolic (congestive) heart failure: Secondary | ICD-10-CM

## 2016-07-27 DIAGNOSIS — J9601 Acute respiratory failure with hypoxia: Secondary | ICD-10-CM | POA: Diagnosis present

## 2016-07-27 DIAGNOSIS — K222 Esophageal obstruction: Secondary | ICD-10-CM | POA: Diagnosis present

## 2016-07-27 DIAGNOSIS — D689 Coagulation defect, unspecified: Secondary | ICD-10-CM | POA: Diagnosis present

## 2016-07-27 DIAGNOSIS — Z881 Allergy status to other antibiotic agents status: Secondary | ICD-10-CM

## 2016-07-27 DIAGNOSIS — J440 Chronic obstructive pulmonary disease with acute lower respiratory infection: Secondary | ICD-10-CM | POA: Diagnosis present

## 2016-07-27 DIAGNOSIS — I481 Persistent atrial fibrillation: Secondary | ICD-10-CM | POA: Diagnosis present

## 2016-07-27 DIAGNOSIS — I4819 Other persistent atrial fibrillation: Secondary | ICD-10-CM | POA: Diagnosis present

## 2016-07-27 DIAGNOSIS — M1990 Unspecified osteoarthritis, unspecified site: Secondary | ICD-10-CM | POA: Diagnosis present

## 2016-07-27 DIAGNOSIS — I5041 Acute combined systolic (congestive) and diastolic (congestive) heart failure: Secondary | ICD-10-CM | POA: Diagnosis not present

## 2016-07-27 DIAGNOSIS — K219 Gastro-esophageal reflux disease without esophagitis: Secondary | ICD-10-CM | POA: Diagnosis not present

## 2016-07-27 DIAGNOSIS — R069 Unspecified abnormalities of breathing: Secondary | ICD-10-CM | POA: Diagnosis not present

## 2016-07-27 DIAGNOSIS — E785 Hyperlipidemia, unspecified: Secondary | ICD-10-CM | POA: Diagnosis present

## 2016-07-27 DIAGNOSIS — F418 Other specified anxiety disorders: Secondary | ICD-10-CM | POA: Diagnosis not present

## 2016-07-27 DIAGNOSIS — R0603 Acute respiratory distress: Secondary | ICD-10-CM

## 2016-07-27 DIAGNOSIS — R0602 Shortness of breath: Secondary | ICD-10-CM | POA: Diagnosis not present

## 2016-07-27 DIAGNOSIS — Z8673 Personal history of transient ischemic attack (TIA), and cerebral infarction without residual deficits: Secondary | ICD-10-CM

## 2016-07-27 DIAGNOSIS — I480 Paroxysmal atrial fibrillation: Secondary | ICD-10-CM | POA: Diagnosis not present

## 2016-07-27 DIAGNOSIS — I5023 Acute on chronic systolic (congestive) heart failure: Secondary | ICD-10-CM | POA: Diagnosis not present

## 2016-07-27 DIAGNOSIS — Z7901 Long term (current) use of anticoagulants: Secondary | ICD-10-CM

## 2016-07-27 DIAGNOSIS — K449 Diaphragmatic hernia without obstruction or gangrene: Secondary | ICD-10-CM | POA: Diagnosis present

## 2016-07-27 DIAGNOSIS — Z888 Allergy status to other drugs, medicaments and biological substances status: Secondary | ICD-10-CM

## 2016-07-27 DIAGNOSIS — Z791 Long term (current) use of non-steroidal anti-inflammatories (NSAID): Secondary | ICD-10-CM | POA: Diagnosis not present

## 2016-07-27 DIAGNOSIS — R0600 Dyspnea, unspecified: Secondary | ICD-10-CM

## 2016-07-27 DIAGNOSIS — J449 Chronic obstructive pulmonary disease, unspecified: Secondary | ICD-10-CM | POA: Diagnosis not present

## 2016-07-27 LAB — I-STAT VENOUS BLOOD GAS, ED
Acid-base deficit: 2 mmol/L (ref 0.0–2.0)
Bicarbonate: 23.6 mEq/L (ref 20.0–24.0)
O2 Saturation: 95 %
TCO2: 25 mmol/L (ref 0–100)
pCO2, Ven: 40.7 mmHg — ABNORMAL LOW (ref 45.0–50.0)
pH, Ven: 7.371 — ABNORMAL HIGH (ref 7.250–7.300)
pO2, Ven: 76 mmHg — ABNORMAL HIGH (ref 31.0–45.0)

## 2016-07-27 LAB — COMPREHENSIVE METABOLIC PANEL
ALT: 42 U/L (ref 14–54)
AST: 40 U/L (ref 15–41)
Albumin: 3.8 g/dL (ref 3.5–5.0)
Alkaline Phosphatase: 72 U/L (ref 38–126)
Anion gap: 8 (ref 5–15)
BUN: 40 mg/dL — ABNORMAL HIGH (ref 6–20)
CO2: 22 mmol/L (ref 22–32)
Calcium: 9.1 mg/dL (ref 8.9–10.3)
Chloride: 109 mmol/L (ref 101–111)
Creatinine, Ser: 0.98 mg/dL (ref 0.44–1.00)
GFR calc Af Amer: >60 mL/min (ref >60)
GFR calc non Af Amer: 55 mL/min — ABNORMAL LOW (ref >60)
Glucose, Bld: 130 mg/dL — ABNORMAL HIGH (ref 65–99)
Potassium: 4.1 mmol/L (ref 3.5–5.1)
Sodium: 139 mmol/L (ref 135–145)
Total Bilirubin: 0.6 mg/dL (ref 0.3–1.2)
Total Protein: 6 g/dL — ABNORMAL LOW (ref 6.5–8.1)

## 2016-07-27 LAB — I-STAT CHEM 8, ED
BUN: 40 mg/dL — ABNORMAL HIGH (ref 6–20)
Calcium, Ion: 1.14 mmol/L (ref 1.12–1.23)
Chloride: 106 mmol/L (ref 101–111)
Creatinine, Ser: 0.9 mg/dL (ref 0.44–1.00)
Glucose, Bld: 126 mg/dL — ABNORMAL HIGH (ref 65–99)
HCT: 38 % (ref 36.0–46.0)
Hemoglobin: 12.9 g/dL (ref 12.0–15.0)
Potassium: 4.1 mmol/L (ref 3.5–5.1)
Sodium: 140 mmol/L (ref 135–145)
TCO2: 23 mmol/L (ref 0–100)

## 2016-07-27 LAB — MAGNESIUM
Magnesium: 2.1 mg/dL (ref 1.7–2.4)
Magnesium: 2.8 mg/dL — ABNORMAL HIGH (ref 1.7–2.4)

## 2016-07-27 LAB — CBC
HCT: 38.1 % (ref 36.0–46.0)
Hemoglobin: 12.1 g/dL (ref 12.0–15.0)
MCH: 25.9 pg — ABNORMAL LOW (ref 26.0–34.0)
MCHC: 31.8 g/dL (ref 30.0–36.0)
MCV: 81.6 fL (ref 78.0–100.0)
Platelets: 240 10*3/uL (ref 150–400)
RBC: 4.67 MIL/uL (ref 3.87–5.11)
RDW: 15.6 % — ABNORMAL HIGH (ref 11.5–15.5)
WBC: 11.9 10*3/uL — ABNORMAL HIGH (ref 4.0–10.5)

## 2016-07-27 LAB — PROCALCITONIN: Procalcitonin: <0.1 ng/mL

## 2016-07-27 LAB — PHOSPHORUS: Phosphorus: 2.7 mg/dL (ref 2.5–4.6)

## 2016-07-27 LAB — I-STAT CG4 LACTIC ACID, ED
Lactic Acid, Venous: 1.62 mmol/L (ref 0.5–1.9)
Lactic Acid, Venous: 2.59 mmol/L (ref 0.5–1.9)

## 2016-07-27 LAB — T4, FREE: Free T4: 0.98 ng/dL (ref 0.61–1.12)

## 2016-07-27 LAB — BRAIN NATRIURETIC PEPTIDE: B Natriuretic Peptide: 723.5 pg/mL — ABNORMAL HIGH (ref 0.0–100.0)

## 2016-07-27 LAB — TROPONIN I
Troponin I: <0.03 ng/mL (ref <0.03)
Troponin I: <0.03 ng/mL (ref <0.03)
Troponin I: <0.03 ng/mL (ref <0.03)

## 2016-07-27 LAB — PROTIME-INR
INR: 2.9
Prothrombin Time: 31 seconds — ABNORMAL HIGH (ref 11.4–15.2)

## 2016-07-27 LAB — I-STAT TROPONIN, ED: Troponin i, poc: 0 ng/mL (ref 0.00–0.08)

## 2016-07-27 LAB — TSH: TSH: 0.568 u[IU]/mL (ref 0.350–4.500)

## 2016-07-27 LAB — MRSA PCR SCREENING: MRSA by PCR: NEGATIVE

## 2016-07-27 MED ORDER — DIGOXIN 0.25 MG/ML IJ SOLN
0.5000 mg | Freq: Once | INTRAMUSCULAR | Status: AC
  Administered 2016-07-27: 0.5 mg via INTRAVENOUS
  Filled 2016-07-27: qty 2

## 2016-07-27 MED ORDER — WARFARIN SODIUM 7.5 MG PO TABS
7.5000 mg | ORAL_TABLET | Freq: Once | ORAL | Status: AC
Start: 2016-07-27 — End: 2016-07-27
  Administered 2016-07-27: 7.5 mg via ORAL
  Filled 2016-07-27 (×2): qty 1

## 2016-07-27 MED ORDER — ACETAMINOPHEN 325 MG PO TABS: 650.0000 mg | ORAL_TABLET | Freq: Four times a day (QID) | ORAL | Status: DC | PRN

## 2016-07-27 MED ORDER — METHYLPREDNISOLONE SODIUM SUCC 125 MG IJ SOLR
125.0000 mg | Freq: Once | INTRAMUSCULAR | Status: AC
  Administered 2016-07-27: 125 mg via INTRAVENOUS
  Filled 2016-07-27: qty 2

## 2016-07-27 MED ORDER — SODIUM CHLORIDE 0.9 % IV SOLN
250.0000 mL | INTRAVENOUS | Status: DC | PRN
  Administered 2016-07-28: 10 mL via INTRAVENOUS
  Administered 2016-08-01: 500 mL via INTRAVENOUS

## 2016-07-27 MED ORDER — MIRABEGRON ER 25 MG PO TB24
50.0000 mg | ORAL_TABLET | Freq: Every day | ORAL | Status: DC
  Filled 2016-07-27: qty 1

## 2016-07-27 MED ORDER — MONTELUKAST SODIUM 10 MG PO TABS
10.0000 mg | ORAL_TABLET | Freq: Every day | ORAL | Status: DC
  Administered 2016-07-27 – 2016-08-02 (×7): 10 mg via ORAL
  Filled 2016-07-27 (×7): qty 1

## 2016-07-27 MED ORDER — OXYCODONE HCL 5 MG PO TABS: 5.0000 mg | ORAL_TABLET | ORAL | Status: DC | PRN

## 2016-07-27 MED ORDER — CYCLOBENZAPRINE HCL 10 MG PO TABS: 5.0000 mg | ORAL_TABLET | Freq: Three times a day (TID) | ORAL | Status: DC | PRN

## 2016-07-27 MED ORDER — LEVALBUTEROL HCL 1.25 MG/0.5ML IN NEBU
1.2500 mg | INHALATION_SOLUTION | Freq: Four times a day (QID) | RESPIRATORY_TRACT | Status: DC
  Administered 2016-07-27 – 2016-07-29 (×7): 1.25 mg via RESPIRATORY_TRACT
  Filled 2016-07-27 (×8): qty 0.5

## 2016-07-27 MED ORDER — PANTOPRAZOLE SODIUM 40 MG PO TBEC
80.0000 mg | DELAYED_RELEASE_TABLET | Freq: Every day | ORAL | Status: DC
  Administered 2016-07-28 – 2016-08-02 (×6): 80 mg via ORAL
  Filled 2016-07-27 (×6): qty 2

## 2016-07-27 MED ORDER — DIGOXIN 0.25 MG/ML IJ SOLN
0.2500 mg | Freq: Once | INTRAMUSCULAR | Status: AC
  Administered 2016-07-27: 0.25 mg via INTRAVENOUS
  Filled 2016-07-27: qty 2

## 2016-07-27 MED ORDER — ACETAMINOPHEN 650 MG RE SUPP: 650.0000 mg | Freq: Four times a day (QID) | RECTAL | Status: DC | PRN

## 2016-07-27 MED ORDER — DILTIAZEM LOAD VIA INFUSION
10.0000 mg | Freq: Once | INTRAVENOUS | Status: AC
  Administered 2016-07-27: 10 mg via INTRAVENOUS
  Filled 2016-07-27: qty 10

## 2016-07-27 MED ORDER — FUROSEMIDE 10 MG/ML IJ SOLN
20.0000 mg | Freq: Two times a day (BID) | INTRAMUSCULAR | Status: DC
  Administered 2016-07-27 – 2016-07-30 (×6): 20 mg via INTRAVENOUS
  Filled 2016-07-27 (×6): qty 2

## 2016-07-27 MED ORDER — SODIUM CHLORIDE 0.9% FLUSH: 3.0000 mL | INTRAVENOUS | Status: DC | PRN

## 2016-07-27 MED ORDER — AMLODIPINE BESYLATE 5 MG PO TABS: 5.0000 mg | ORAL_TABLET | Freq: Every morning | ORAL | Status: DC

## 2016-07-27 MED ORDER — CITALOPRAM HYDROBROMIDE 20 MG PO TABS
20.0000 mg | ORAL_TABLET | Freq: Every day | ORAL | Status: DC
  Administered 2016-07-28 – 2016-08-02 (×6): 20 mg via ORAL
  Filled 2016-07-27 (×6): qty 1

## 2016-07-27 MED ORDER — FUROSEMIDE 10 MG/ML IJ SOLN
20.0000 mg | Freq: Once | INTRAMUSCULAR | Status: AC
  Administered 2016-07-27: 20 mg via INTRAVENOUS
  Filled 2016-07-27: qty 2

## 2016-07-27 MED ORDER — ALBUTEROL SULFATE (2.5 MG/3ML) 0.083% IN NEBU
5.0000 mg | INHALATION_SOLUTION | Freq: Once | RESPIRATORY_TRACT | Status: AC
  Administered 2016-07-27: 5 mg via RESPIRATORY_TRACT
  Filled 2016-07-27: qty 6

## 2016-07-27 MED ORDER — MAGNESIUM SULFATE 2 GM/50ML IV SOLN
2.0000 g | Freq: Once | INTRAVENOUS | Status: AC
  Administered 2016-07-27: 2 g via INTRAVENOUS
  Filled 2016-07-27: qty 50

## 2016-07-27 MED ORDER — ONDANSETRON HCL 4 MG PO TABS: 4.0000 mg | ORAL_TABLET | Freq: Four times a day (QID) | ORAL | Status: DC | PRN

## 2016-07-27 MED ORDER — AZELASTINE HCL 0.1 % NA SOLN
2.0000 | Freq: Two times a day (BID) | NASAL | Status: DC | PRN
  Filled 2016-07-27: qty 30

## 2016-07-27 MED ORDER — SODIUM CHLORIDE 0.9% FLUSH
3.0000 mL | Freq: Two times a day (BID) | INTRAVENOUS | Status: DC
  Administered 2016-07-27 – 2016-08-01 (×7): 3 mL via INTRAVENOUS

## 2016-07-27 MED ORDER — ONDANSETRON HCL 4 MG/2ML IJ SOLN: 4.0000 mg | Freq: Four times a day (QID) | INTRAMUSCULAR | Status: DC | PRN

## 2016-07-27 MED ORDER — ASPIRIN 81 MG PO CHEW
324.0000 mg | CHEWABLE_TABLET | Freq: Once | ORAL | Status: AC
  Administered 2016-07-27: 324 mg via ORAL
  Filled 2016-07-27: qty 4

## 2016-07-27 MED ORDER — ACETAMINOPHEN 325 MG PO TABS: 650.0000 mg | ORAL_TABLET | ORAL | Status: DC | PRN

## 2016-07-27 MED ORDER — SODIUM CHLORIDE 0.9 % IV BOLUS (SEPSIS)
500.0000 mL | Freq: Once | INTRAVENOUS | Status: AC
  Administered 2016-07-27: 500 mL via INTRAVENOUS

## 2016-07-27 MED ORDER — IPRATROPIUM BROMIDE 0.02 % IN SOLN
1.0000 mg | Freq: Once | RESPIRATORY_TRACT | Status: AC
  Administered 2016-07-27: 1 mg via RESPIRATORY_TRACT
  Filled 2016-07-27: qty 5

## 2016-07-27 MED ORDER — IRBESARTAN 150 MG PO TABS
150.0000 mg | ORAL_TABLET | Freq: Every morning | ORAL | Status: DC
  Administered 2016-07-28 – 2016-08-02 (×6): 150 mg via ORAL
  Filled 2016-07-27 (×6): qty 1

## 2016-07-27 MED ORDER — MELATONIN 1 MG PO TABS: 3.0000 mg | ORAL_TABLET | Freq: Every day | ORAL | Status: DC

## 2016-07-27 MED ORDER — TIOTROPIUM BROMIDE MONOHYDRATE 18 MCG IN CAPS
1.0000 | ORAL_CAPSULE | Freq: Every day | RESPIRATORY_TRACT | Status: DC
Start: 2016-07-27 — End: 2016-08-02
  Administered 2016-07-28 – 2016-08-02 (×5): 18 ug via RESPIRATORY_TRACT
  Filled 2016-07-27 (×2): qty 5

## 2016-07-27 MED ORDER — DOXYCYCLINE HYCLATE 100 MG IV SOLR
100.0000 mg | Freq: Two times a day (BID) | INTRAVENOUS | Status: AC
  Administered 2016-07-27 – 2016-08-01 (×11): 100 mg via INTRAVENOUS
  Filled 2016-07-27 (×13): qty 100

## 2016-07-27 MED ORDER — METHYLPREDNISOLONE SODIUM SUCC 125 MG IJ SOLR
60.0000 mg | Freq: Two times a day (BID) | INTRAMUSCULAR | Status: DC
  Administered 2016-07-27 – 2016-07-30 (×6): 60 mg via INTRAVENOUS
  Filled 2016-07-27 (×6): qty 2

## 2016-07-27 MED ORDER — MOMETASONE FURO-FORMOTEROL FUM 200-5 MCG/ACT IN AERO
2.0000 | INHALATION_SPRAY | Freq: Two times a day (BID) | RESPIRATORY_TRACT | Status: DC
  Administered 2016-07-27 – 2016-08-02 (×7): 2 via RESPIRATORY_TRACT
  Filled 2016-07-27 (×2): qty 8.8

## 2016-07-27 MED ORDER — ZOLPIDEM TARTRATE 5 MG PO TABS
5.0000 mg | ORAL_TABLET | Freq: Once | ORAL | Status: AC
  Administered 2016-07-27: 5 mg via ORAL
  Filled 2016-07-27: qty 1

## 2016-07-27 MED ORDER — SODIUM CHLORIDE 0.9% FLUSH
3.0000 mL | Freq: Two times a day (BID) | INTRAVENOUS | Status: DC
  Administered 2016-07-27 – 2016-08-01 (×7): 3 mL via INTRAVENOUS

## 2016-07-27 MED ORDER — LEVALBUTEROL HCL 1.25 MG/0.5ML IN NEBU
1.2500 mg | INHALATION_SOLUTION | Freq: Four times a day (QID) | RESPIRATORY_TRACT | Status: DC
Start: 2016-07-27 — End: 2016-07-27
  Administered 2016-07-27: 1.25 mg via RESPIRATORY_TRACT
  Filled 2016-07-27 (×2): qty 0.5

## 2016-07-27 MED ORDER — LORAZEPAM 2 MG/ML IJ SOLN
1.0000 mg | Freq: Four times a day (QID) | INTRAMUSCULAR | Status: DC | PRN
  Administered 2016-07-27: 1 mg via INTRAVENOUS
  Filled 2016-07-27 (×2): qty 1

## 2016-07-27 MED ORDER — WARFARIN - PHARMACIST DOSING INPATIENT
Freq: Every day | Status: DC
  Administered 2016-08-01: 18:00:00

## 2016-07-27 MED ORDER — EZETIMIBE 10 MG PO TABS
10.0000 mg | ORAL_TABLET | Freq: Every day | ORAL | Status: DC
  Administered 2016-07-28 – 2016-08-02 (×6): 10 mg via ORAL
  Filled 2016-07-27 (×6): qty 1

## 2016-07-27 MED ORDER — GUAIFENESIN ER 600 MG PO TB12
1200.0000 mg | ORAL_TABLET | Freq: Two times a day (BID) | ORAL | Status: DC
  Administered 2016-07-27 – 2016-08-02 (×13): 1200 mg via ORAL
  Filled 2016-07-27 (×13): qty 2

## 2016-07-27 MED ORDER — DILTIAZEM HCL-DEXTROSE 100-5 MG/100ML-% IV SOLN (PREMIX)
5.0000 mg/h | INTRAVENOUS | Status: DC
  Administered 2016-07-27: 5 mg/h via INTRAVENOUS
  Administered 2016-07-27 – 2016-07-28 (×4): 10 mg/h via INTRAVENOUS
  Filled 2016-07-27 (×6): qty 100

## 2016-07-27 MED ORDER — ALBUTEROL (5 MG/ML) CONTINUOUS INHALATION SOLN
10.0000 mg/h | INHALATION_SOLUTION | RESPIRATORY_TRACT | Status: DC
  Administered 2016-07-27: 10 mg/h via RESPIRATORY_TRACT
  Filled 2016-07-27: qty 20

## 2016-07-27 NOTE — ED Notes (Signed)
Pt given ice water, per Lexine Baton, Therapist, sports.

## 2016-07-27 NOTE — ED Notes (Signed)
Doctor at bedside.

## 2016-07-27 NOTE — Care Management Note (Signed)
Case Management Note  Patient Details  Name: [name] MRN: [id number] Date of Birth: 03/28/1941  Subjective/Objective:                    75 yo female in the ED now with increasing dyspnea, orthopnea, and AF with RVR. / From home with spouse.  Action/Plan: Follow for disposition needs.   Expected Discharge Date:  07/30/16               Expected Discharge Plan:  [employer]   Discharge planning Services  CM Consult   Status of Service:  In process, will continue to follow  If discussed at Long Length of Stay Meetings, dates discussed:    Additional Comments:  Fuller Mandril, RN 07/27/2016, 1:49 PM

## 2016-07-27 NOTE — ED Notes (Signed)
Zoll pads placed on patient.  

## 2016-07-27 NOTE — Progress Notes (Deleted)
Cardiology Office Note   Date:  07/27/2016   ID:  Norrine, Truglio 20-Mar-1941, MRN [id number]  PCP:  Cathlean Cower, MD  Cardiologist:  ***    No chief complaint on file.     History of Present Illness: [name] is a 75 y.o. female who presents for ***    Past Medical History:  Diagnosis Date  . Allergic rhinitis   . Allergy   . Anxiety   . Basal cell carcinoma   . Cataract    bil cataracts removed  . COPD (chronic obstructive pulmonary disease) ([place])   . Depression 11/29/2013  . DI (detrusor instability)   . Dysrhythmia   . Esophageal stricture   . GERD (gastroesophageal reflux disease)   . Hemorrhoids   . Hiatal hernia   . History of cerebrovascular accident   . History of kidney stones   . HLD (hyperlipidemia)   . HTN (hypertension)   . Nephrolithiasis    hx  . Osteoarthritis   . Pneumonia 2013  . Shingles since Nov 18, 2013   on right arm from shoulder to wrist  . Shortness of breath dyspnea    WITH EXERTION   . Status post dilation of esophageal narrowing   . Stroke [facility]) 2001   STROKES, TIA'S  . TIA (transient ischemic attack) last 2001   anticoagulation therapy on coumadin    Past Surgical History:  Procedure Laterality Date  . ANTERIOR AND POSTERIOR VAGINAL REPAIR  2008   A&P REPAIR-DR MCDIARMID  . ANTERIOR LATERAL LUMBAR FUSION 4 LEVELS Right 07/03/2015   Procedure: Right Lumbar one-two, Lumbar two-three, Lumbar three-four, Lumbar four-five Anterior lateral lumbar interbody fusion;  Surgeon: Erline Levine, MD;  Location: [place] [facility];  Service: Neurosurgery;  Laterality: Right;  Right L1-2 L2-3 L3-4 L4-5 Anterior lateral lumbar interbody fusion  . BALLOON DILATION N/A 03/03/2014   Procedure: BALLOON DILATION;  Surgeon: Inda Castle, MD;  [facility];  Service: Endoscopy;  Laterality: N/A;  . BLADDER SUSPENSION    . COLONOSCOPY    . ESOPHAGOGASTRODUODENOSCOPY N/A 03/03/2014   Procedure: ESOPHAGOGASTRODUODENOSCOPY (EGD);   Surgeon: Inda Castle, MD;  [facility];  Service: Endoscopy;  Laterality: N/A;  . EXCISION OF BASAL CELL CA  2011  . GLUTEUS MINIMUS REPAIR Right 03/15/2016   Procedure: RIGHT HIP GLUTEUS MEDIUS TENDON REPAIR;  Surgeon: Paralee Cancel, MD;  [facility];  Service: Orthopedics;  Laterality: Right;  . LUMBAR PERCUTANEOUS PEDICLE SCREW 4 LEVEL Right 07/03/2015   Procedure: Lumbar one-Sacral one Bilateral percutaneous pedicle screws;  Surgeon: Erline Levine, MD;  Location: [place] [facility];  Service: Neurosurgery;  Laterality: Right;  L1-S1 Bilateral percutaneous pedicle screws  . RECTOCELE REPAIR  2008   A&P REPAIR -DR. MCDIARMID  . THUMB SURGERY Right 10-15 yrs ago  . UPPER GASTROINTESTINAL ENDOSCOPY    . VAGINAL HYSTERECTOMY  1983   NO BSO-DR. MABRY  . VARICOSE VEIN SURGERY       No current facility-administered medications for this visit.    No current outpatient prescriptions on file.   Facility-Administered Medications Ordered in Other Visits  Medication Dose Route Frequency Provider Last Rate Last Dose  . 0.9 %  sodium chloride infusion  250 mL Intravenous PRN Waldemar Dickens, MD      . acetaminophen (TYLENOL) tablet 650 mg  650 mg Oral Q6H PRN Waldemar Dickens, MD       Or  . acetaminophen (TYLENOL) suppository 650 mg  650  mg Rectal Q6H PRN Waldemar Dickens, MD      . azelastine (ASTELIN) 0.1 % nasal spray 2 spray  2 spray Each Nare BID PRN Waldemar Dickens, MD      . Derrill Memo ON 07/28/2016] citalopram (CELEXA) tablet 20 mg  20 mg Oral Daily Waldemar Dickens, MD      . cyclobenzaprine (FLEXERIL) tablet 5 mg  5 mg Oral TID PRN Waldemar Dickens, MD      . diltiazem (CARDIZEM) 100 mg in dextrose 5% 176mL (1 mg/mL) infusion  5-15 mg/hr Intravenous Continuous Duffy Bruce, MD   10 mg/hr at 07/27/16 1628  . doxycycline (VIBRAMYCIN) 100 mg in dextrose 5 % 250 mL IVPB  100 mg Intravenous Q12H Waldemar Dickens, MD   100 mg at 07/27/16 1354  . [START ON 07/28/2016] ezetimibe (ZETIA) tablet  10 mg  10 mg Oral Daily Waldemar Dickens, MD      . furosemide (LASIX) injection 20 mg  20 mg Intravenous BID Waldemar Dickens, MD   20 mg at 07/27/16 1748  . guaiFENesin (MUCINEX) 12 hr tablet 1,200 mg  1,200 mg Oral BID Waldemar Dickens, MD   1,200 mg at 07/27/16 2142  . [START ON 07/28/2016] irbesartan (AVAPRO) tablet 150 mg  150 mg Oral q morning - 10a Waldemar Dickens, MD      . levalbuterol [facility]) nebulizer solution 1.25 mg  1.25 mg Nebulization Q6H Waldemar Dickens, MD   1.25 mg at 07/27/16 2028  . LORazepam (ATIVAN) injection 1 mg  1 mg Intravenous Q6H PRN Waldemar Dickens, MD   1 mg at 07/27/16 1523  . methylPREDNISolone sodium succinate (SOLU-MEDROL) 125 mg/2 mL injection 60 mg  60 mg Intravenous Q12H Waldemar Dickens, MD   60 mg at 07/27/16 2142  . [START ON 07/28/2016] mirabegron ER (MYRBETRIQ) tablet 50 mg  50 mg Oral Daily Waldemar Dickens, MD      . mometasone-formoterol [facility]) 200-5 MCG/ACT inhaler 2 puff  2 puff Inhalation BID Waldemar Dickens, MD   2 puff at 07/27/16 2032  . montelukast (SINGULAIR) tablet 10 mg  10 mg Oral Daily Waldemar Dickens, MD   10 mg at 07/27/16 1747  . ondansetron (ZOFRAN) tablet 4 mg  4 mg Oral Q6H PRN Waldemar Dickens, MD       Or  . ondansetron [facility]) injection 4 mg  4 mg Intravenous Q6H PRN Waldemar Dickens, MD      . oxyCODONE (Oxy IR/ROXICODONE) immediate release tablet 5 mg  5 mg Oral Q4H PRN Waldemar Dickens, MD      . Derrill Memo ON 07/28/2016] pantoprazole (PROTONIX) EC tablet 80 mg  80 mg Oral Daily Waldemar Dickens, MD      . sodium chloride flush (NS) 0.9 % injection 3 mL  3 mL Intravenous Q12H Waldemar Dickens, MD   3 mL at 07/27/16 2143  . sodium chloride flush (NS) 0.9 % injection 3 mL  3 mL Intravenous Q12H Waldemar Dickens, MD   3 mL at 07/27/16 2142  . sodium chloride flush (NS) 0.9 % injection 3 mL  3 mL Intravenous PRN Waldemar Dickens, MD      . tiotropium [facility]) inhalation capsule 18 mcg  1 capsule Inhalation Daily Waldemar Dickens, MD      .  Warfarin - Pharmacist Dosing Inpatient   Does not apply [place], [place]      .  zolpidem (AMBIEN) tablet 5 mg  5 mg Oral Once Gardiner Barefoot, NP        Allergies:   Clarithromycin; Lipitor [atorvastatin]; Simvastatin; and Crestor [rosuvastatin calcium]    Social History:  The patient  reports that she quit smoking about 20 years ago. Her smoking use included Cigarettes. She started smoking about 62 years ago. She has a 126.00 pack-year smoking history. She has never used smokeless tobacco. She reports that she drinks alcohol. She reports that she does not use drugs.   Family History:  The patient's ***family history includes Breast cancer in her maternal aunt and mother; COPD in her mother; Emphysema in her mother; Uterine cancer in her mother.    ROS:  General:no colds or fevers, no weight changes Skin:no rashes or ulcers HEENT:no blurred vision, no congestion CV:see HPI PUL:see HPI GI:no diarrhea constipation or melena, no indigestion GU:no hematuria, no dysuria MS:no joint pain, no claudication Neuro:no syncope, no lightheadedness Endo:no diabetes, no thyroid disease Wt Readings from Last 3 Encounters:  07/27/16 148 lb (67.1 kg)  07/19/16 149 lb (67.6 kg)  06/09/16 147 lb 12.8 oz (67 kg)     PHYSICAL EXAM: VS:  LMP  (Exact Date)  , BMI There is no height or weight on file to calculate BMI. General:Pleasant affect, NAD Skin:Warm and dry, brisk capillary refill HEENT:normocephalic, sclera clear, mucus membranes moist Neck:supple, no JVD, no bruits  Heart:S1S2 RRR without murmur, gallup, rub or click Lungs:clear without rales, rhonchi, or wheezes [id number], non tender, + BS, do not palpate liver spleen or masses Ext:no lower ext edema, 2+ pedal pulses, 2+ radial pulses Neuro:alert and oriented, MAE, follows commands, + facial symmetry    EKG:  EKG is ordered today. The ekg ordered today demonstrates ***   Recent Labs: 07/27/2016: ALT 42; B Natriuretic  Peptide 723.5; BUN 40; Creatinine, Ser 0.90; Hemoglobin 12.9; Magnesium 2.8; Platelets 240; Potassium 4.1; Sodium 140; TSH 0.568    Lipid Panel    Component Value Date/Time   CHOL 239 (H) 11/12/2015 1142   TRIG 122.0 11/12/2015 1142   HDL 49.70 11/12/2015 1142   CHOLHDL 5 11/12/2015 1142   VLDL 24.4 11/12/2015 1142   LDLCALC 165 (H) 11/12/2015 1142   LDLDIRECT 241.1 12/02/2014 1528       Other studies Reviewed: Additional studies/ records that were reviewed today include: ***.   ASSESSMENT AND PLAN:  1.  ***   Current medicines are reviewed with the patient today.  The patient Has no concerns regarding medicines.  The following changes have been made:  See above Labs/ tests ordered today include:see above  Disposition:   FU:  see above  Signed, Cecilie Kicks, NP  07/27/2016 9:55 PM    [facility] [address], [address], [address] [place], [place] Phone: [phone]; Fax: [phone]

## 2016-07-27 NOTE — Consult Note (Signed)
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 She has been on Coumadin since. Other problems include COPD- followed by Dr Ashok Cordia, (she was recently placed on steroids for a cough), GERD s/p esophageal dilatation May 2017, HTN with grade 2 DD, HLD, and prior bradycardia and PVCs.  She had hip surgery in April 2017. She started rehab in June and that's when she first noticed increased dyspnea and tachycardia while at rehab. She though she was out of shape after surgery. She was referred to Dr Harrington Challenger in July after her pulmonologist noted slow HR and PVCs. A Holter monitor showed NSR, PVCs, and bigeminy but no atrial arrhythmias. Echo 06/24/16 showed her EF to be 45-50% with grade 2 DD, moderate to severe LA enlargement, and moderate MR. There were frequent PVCs noted.             She is seen in the ED now with increasing dyspnea, orthopnea, and AF with RVR. Pt says she had a dry cough recently and was placed on steroids. She noticed increased DOE and occasional tachycardia the last 48 hrs. This am she could not lay flat in bed secondary to SOB. EKG in the ED shows AF with RVR, IVCD, PVCs. She feels better after IV Diltiazem.  She does say she had bandlike "tightness this am". No history of MI, cath, or known CAD. She has a history of a remote stress test.    PMHx:  Past Medical History:  Diagnosis Date  . Allergic rhinitis   . Allergy   . Anxiety   . Basal cell carcinoma   . Cataract    bil cataracts removed  . COPD (chronic obstructive pulmonary disease) ([place])   . Depression 11/29/2013  . DI (detrusor instability)   . Dysrhythmia   . Esophageal stricture   . GERD (gastroesophageal reflux disease)   . Hemorrhoids   . Hiatal hernia   . History of cerebrovascular accident   . History  of kidney stones   . HLD (hyperlipidemia)   . HTN (hypertension)   . Nephrolithiasis    hx  . Osteoarthritis   . Pneumonia 2013  . Shingles since Nov 18, 2013   on right arm from shoulder to wrist  . Shortness of breath dyspnea    WITH EXERTION   . Status post dilation of esophageal narrowing   . Stroke [facility]) 2001   STROKES, TIA'S  . TIA (transient ischemic attack) last 2001   anticoagulation therapy on coumadin    Past Surgical History:  Procedure Laterality Date  . ANTERIOR AND POSTERIOR VAGINAL REPAIR  2008   A&P REPAIR-DR MCDIARMID  . ANTERIOR LATERAL LUMBAR FUSION 4 LEVELS Right 07/03/2015   Procedure: Right Lumbar one-two, Lumbar two-three, Lumbar three-four, Lumbar four-five Anterior lateral lumbar interbody fusion;  Surgeon: Erline Levine, MD;  Location: [facility];  Service: Neurosurgery;  Laterality: Right;  Right L1-2 L2-3 L3-4 L4-5 Anterior lateral lumbar interbody fusion  . BALLOON DILATION N/A 03/03/2014   Procedure: BALLOON DILATION;  Surgeon: Inda Castle, MD;  Location: [facility];  Service: Endoscopy;  Laterality: N/A;  . BLADDER SUSPENSION    . COLONOSCOPY    . ESOPHAGOGASTRODUODENOSCOPY N/A 03/03/2014   Procedure: ESOPHAGOGASTRODUODENOSCOPY (EGD);  Surgeon: Inda Castle, MD;  Location: [facility];  Service: Endoscopy;  Laterality: N/A;  . EXCISION OF BASAL CELL CA  2011  . GLUTEUS MINIMUS REPAIR Right 03/15/2016   Procedure: RIGHT HIP GLUTEUS MEDIUS TENDON REPAIR;  Surgeon: Paralee Cancel, MD;  Location: [facility];  Service: Orthopedics;  Laterality: Right;  . LUMBAR PERCUTANEOUS PEDICLE SCREW 4 LEVEL Right 07/03/2015   Procedure: Lumbar one-Sacral one Bilateral percutaneous pedicle screws;  Surgeon: Erline Levine, MD;  Location: [place] [facility] [facility];  Service: Neurosurgery;  Laterality: Right;  L1-S1 Bilateral percutaneous pedicle screws  . RECTOCELE REPAIR  2008   A&P REPAIR -DR. MCDIARMID  . THUMB SURGERY Right 10-15 yrs ago  . UPPER GASTROINTESTINAL ENDOSCOPY      . VAGINAL HYSTERECTOMY  1983   NO BSO-DR. MABRY  . VARICOSE VEIN SURGERY      SOCHx:  reports that she quit smoking about 20 years ago. Her smoking use included Cigarettes. She started smoking about 62 years ago. She has a 126.00 pack-year smoking history. She has never used smokeless tobacco. She reports that she drinks alcohol. She reports that she does not use drugs.  FAMHx: Family History  Problem Relation Age of Onset  . COPD Mother   . Breast cancer Mother     mid 57's  . Uterine cancer Mother   . Emphysema Mother   . Breast cancer Maternal Aunt     x 3 aunts, post menopausal  . Colon cancer Neg Hx   . Esophageal cancer Neg Hx   . Rectal cancer Neg Hx   . Stomach cancer Neg Hx   . Pancreatic cancer Neg Hx     ALLERGIES: Allergies  Allergen Reactions  . Clarithromycin Other (See Comments)    REACTION: possible rash but could have been legionarres dz with rash  . Lipitor [Atorvastatin] Other (See Comments)    MUSCLE SPASMS  . Simvastatin Other (See Comments)    Muscle and joint pain  . Crestor [Rosuvastatin Calcium]     fagitue and joint pain, "NO STATINS"    ROS: Review of Systems: General: negative for chills, fever, night sweats or weight changes.  Cardiovascular: negative for edema HEENT: negative for any visual disturbances, blindness, glaucoma Dermatological: negative for rash Respiratory: negative for hemoptysis, or wheezing Urologic: negative for hematuria or dysuria Abdominal: negative for nausea, vomiting, diarrhea, bright red blood per rectum, melena, or hematemesis Neurologic: negative for visual changes, syncope, or dizziness Musculoskeletal: negative for back pain, joint pain, or swelling Psych: cooperative and appropriate All other systems reviewed and are otherwise negative except as noted above.   HOME MEDICATIONS: Prior to Admission medications   Medication Sig Start Date End Date Taking? Authorizing Provider  albuterol (PROVENTIL  HFA;VENTOLIN HFA) 108 (90 BASE) MCG/ACT inhaler Inhale 1-2 puffs into the lungs every 6 (six) hours as needed for wheezing or shortness of breath. 01/06/15  Yes Elsie Stain, MD  amLODipine (NORVASC) 5 MG tablet Take 1 tablet (5 mg total) by mouth every morning. 02/23/16  Yes Biagio Borg, MD  azelastine (ASTELIN) 0.1 % nasal spray Place 2 sprays into the nose daily. Use in each nostril as directed 03/01/16  Yes Javier Glazier, MD  benzonatate (TESSALON) 100 MG capsule Take 1 capsule (100 mg total) by mouth 3 (three) times daily as needed for cough. 03/22/16  Yes Tammy S Parrett, NP  budesonide-formoterol (SYMBICORT) 160-4.5 MCG/ACT inhaler Inhale 2 puffs into the lungs 2 (two) times daily. 03/01/16  Yes Javier Glazier, MD  Calcium Carbonate-Vitamin D (  CALCIUM + D PO) Take 1 tablet by mouth 2 (two) times daily.    Yes Historical Provider, MD  citalopram (CELEXA) 20 MG tablet Take 1 tablet (20 mg total) by mouth daily. 11/12/15  Yes Biagio Borg, MD  cyclobenzaprine (FLEXERIL) 5 MG tablet Take 1 tablet (5 mg total) by mouth 3 (three) times daily as needed for muscle spasms. 08/19/15  Yes Biagio Borg, MD  ezetimibe (ZETIA) 10 MG tablet Take 1 tablet (10 mg total) by mouth daily. 07/26/16 10/24/16 Yes Fay Records, MD  fluticasone (FLONASE) 50 MCG/ACT nasal spray Place 1 spray into both nostrils 2 (two) times daily. 12/22/15  Yes Javier Glazier, MD  irbesartan (AVAPRO) 150 MG tablet Take 1 tablet (150 mg total) by mouth every morning. 03/28/16  Yes Biagio Borg, MD  Lidocaine 2 % GEL Apply 1 application topically daily as needed (SHINGLES).   Yes Historical Provider, MD  Magnesium 250 MG TABS Take 1 tablet by mouth daily as needed. Leg cramps   Yes Historical Provider, MD  Melatonin 1 MG TABS Take 3 mg by mouth at bedtime.    Yes Historical Provider, MD  meloxicam (MOBIC) 15 MG tablet Take 15 mg by mouth daily.   Yes Historical Provider, MD  mirabegron ER (MYRBETRIQ) 50 MG TB24 tablet Take 50 mg by  mouth daily.   Yes Historical Provider, MD  montelukast (SINGULAIR) 10 MG tablet Take 10 mg by mouth daily.   Yes Historical Provider, MD  Multiple Vitamin (MULTIVITAMIN) tablet Take 1 tablet by mouth daily.     Yes Historical Provider, MD  omeprazole (PRILOSEC) 40 MG capsule Take 1 capsule (40 mg total) by mouth daily. 04/22/16  Yes Irene Shipper, MD  predniSONE (DELTASONE) 10 MG tablet 6 tabs daily x 3 days, 5 tabs x3 days, 4 tabs x3 days, 3 tabs x3 days, 2 tabs x3 days, 1 tab x3 days 07/25/16  Yes Javier Glazier, MD  Respiratory Therapy Supplies (FLUTTER) DEVI Use 2-3 times per day 01/06/15  Yes Elsie Stain, MD  Spacer/Aero-Holding Chambers (AEROCHAMBER Z-STAT PLUS) inhaler Use as instructed 12/22/15  Yes Javier Glazier, MD  Tiotropium Bromide Monohydrate (SPIRIVA RESPIMAT) 2.5 MCG/ACT AERS Inhale 2 puffs into the lungs daily. 02/19/16  Yes Javier Glazier, MD  warfarin (COUMADIN) 5 MG tablet Take 5-7.5 mg by mouth daily. 5 mg everyday except on Monday Wednesday Friday patient takes 7.5 mg   Yes Historical Provider, MD    HOSPITAL MEDICATIONS: I have reviewed the patient's current medications.  Current Facility-Administered Medications:  .  0.9 %  sodium chloride infusion, 250 mL, Intravenous, PRN, Waldemar Dickens, MD .  acetaminophen (TYLENOL) tablet 650 mg, 650 mg, Oral, Q6H PRN **OR** acetaminophen (TYLENOL) suppository 650 mg, 650 mg, Rectal, Q6H PRN, Waldemar Dickens, MD .  azelastine (ASTELIN) 0.1 % nasal spray 2 spray, 2 spray, Each Nare, BID PRN, Waldemar Dickens, MD .  Derrill Memo ON 07/28/2016] citalopram (CELEXA) tablet 20 mg, 20 mg, Oral, Daily, Waldemar Dickens, MD .  cyclobenzaprine (FLEXERIL) tablet 5 mg, 5 mg, Oral, TID PRN, Waldemar Dickens, MD .  digoxin (LANOXIN) 0.25 MG/ML injection 0.5 mg, 0.5 mg, Intravenous, Once **FOLLOWED BY** digoxin (LANOXIN) 0.25 MG/ML injection 0.25 mg, 0.25 mg, Intravenous, Once, Leonie Man, MD .  Margrett Rud diltiazem (CARDIZEM) 1 mg/mL load  via infusion 10 mg, 10 mg, Intravenous, Once, 10 mg at 07/27/16 1001 **AND** diltiazem (CARDIZEM) 100 mg in dextrose 5% 110mL (  1 mg/mL) infusion, 5-15 mg/hr, Intravenous, Continuous, Duffy Bruce, MD, Last Rate: 10 mL/hr at 07/27/16 1628, 10 mg/hr at 07/27/16 1628 .  doxycycline (VIBRAMYCIN) 100 mg in dextrose 5 % 250 mL IVPB, 100 mg, Intravenous, Q12H, Waldemar Dickens, MD, Last Rate: 125 mL/hr at 07/27/16 1354, 100 mg at 07/27/16 1354 .  [START ON 07/28/2016] ezetimibe (ZETIA) tablet 10 mg, 10 mg, Oral, Daily, Waldemar Dickens, MD .  furosemide (LASIX) injection 20 mg, 20 mg, Intravenous, BID, Waldemar Dickens, MD, 20 mg at 07/27/16 1748 .  guaiFENesin (MUCINEX) 12 hr tablet 1,200 mg, 1,200 mg, Oral, BID, Waldemar Dickens, MD, 1,200 mg at 07/27/16 1747 .  [START ON 07/28/2016] irbesartan (AVAPRO) tablet 150 mg, 150 mg, Oral, q morning - 10a, Waldemar Dickens, MD .  levalbuterol [facility]) nebulizer solution 1.25 mg, 1.25 mg, Nebulization, Q6H, Waldemar Dickens, MD .  LORazepam (ATIVAN) injection 1 mg, 1 mg, Intravenous, Q6H PRN, Waldemar Dickens, MD, 1 mg at 07/27/16 1523 .  methylPREDNISolone sodium succinate (SOLU-MEDROL) 125 mg/2 mL injection 60 mg, 60 mg, Intravenous, Q12H, Waldemar Dickens, MD .  Derrill Memo ON 07/28/2016] mirabegron ER (MYRBETRIQ) tablet 50 mg, 50 mg, Oral, Daily, Waldemar Dickens, MD .  mometasone-formoterol [facility]) 200-5 MCG/ACT inhaler 2 puff, 2 puff, Inhalation, BID, Waldemar Dickens, MD .  montelukast (SINGULAIR) tablet 10 mg, 10 mg, Oral, Daily, Waldemar Dickens, MD, 10 mg at 07/27/16 1747 .  ondansetron (ZOFRAN) tablet 4 mg, 4 mg, Oral, Q6H PRN **OR** ondansetron (ZOFRAN) injection 4 mg, 4 mg, Intravenous, Q6H PRN, Waldemar Dickens, MD .  oxyCODONE (Oxy IR/ROXICODONE) immediate release tablet 5 mg, 5 mg, Oral, Q4H PRN, Waldemar Dickens, MD .  Derrill Memo ON 07/28/2016] pantoprazole (PROTONIX) EC tablet 80 mg, 80 mg, Oral, Daily, Waldemar Dickens, MD .  sodium chloride flush (NS) 0.9 % injection  3 mL, 3 mL, Intravenous, Q12H, Waldemar Dickens, MD .  sodium chloride flush (NS) 0.9 % injection 3 mL, 3 mL, Intravenous, Q12H, Waldemar Dickens, MD .  sodium chloride flush (NS) 0.9 % injection 3 mL, 3 mL, Intravenous, PRN, Waldemar Dickens, MD .  tiotropium [facility]) inhalation capsule 18 mcg, 1 capsule, Inhalation, Daily, Waldemar Dickens, MD .  Warfarin - Pharmacist Dosing Inpatient, , Does not apply, q1800, Para March, RPH  VITALS: Blood pressure 127/79, pulse 112, temperature 97.9 F (36.6 C), temperature source Oral, resp. rate 22, height 5\' 4"  (1.626 m), weight 148 lb (67.1 kg), SpO2 94 %.  PHYSICAL EXAM: General appearance: alert, cooperative, mild distress and mild SOB at rest Neck: no carotid bruit and no JVD Lungs: scattered rhonchi Heart: irregularly irregular rhythm Abdomen: soft, non-tender; bowel sounds normal; no masses,  no organomegaly Extremities: no edema Pulses: 2+ and symmetric Skin: pale, cool, dry Neurologic: Grossly normal  LABS: Results for orders placed or performed during the hospital encounter of 07/27/16 (from the past 24 hour(s))  CBC     Status: Abnormal   Collection Time: 07/27/16  9:48 AM  Result Value Ref Range   WBC 11.9 (H) 4.0 - 10.5 K/uL   RBC 4.67 3.87 - 5.11 MIL/uL   Hemoglobin 12.1 12.0 - 15.0 g/dL   HCT 38.1 36.0 - 46.0 %   MCV 81.6 78.0 - 100.0 fL   MCH 25.9 (L) 26.0 - 34.0 pg   MCHC 31.8 30.0 - 36.0 g/dL   RDW 15.6 (H) 11.5 - 15.5 %   Platelets 240 150 -  400 K/uL  Brain natriuretic peptide     Status: Abnormal   Collection Time: 07/27/16  9:52 AM  Result Value Ref Range   B Natriuretic Peptide 723.5 (H) 0.0 - 100.0 pg/mL  Comprehensive metabolic panel     Status: Abnormal   Collection Time: 07/27/16  9:52 AM  Result Value Ref Range   Sodium 139 135 - 145 mmol/L   Potassium 4.1 3.5 - 5.1 mmol/L   Chloride 109 101 - 111 mmol/L   CO2 22 22 - 32 mmol/L   Glucose, Bld 130 (H) 65 - 99 mg/dL   BUN 40 (H) 6 - 20 mg/dL    Creatinine, Ser 0.98 0.44 - 1.00 mg/dL   Calcium 9.1 8.9 - 10.3 mg/dL   Total Protein 6.0 (L) 6.5 - 8.1 g/dL   Albumin 3.8 3.5 - 5.0 g/dL   AST 40 15 - 41 U/L   ALT 42 14 - 54 U/L   Alkaline Phosphatase 72 38 - 126 U/L   Total Bilirubin 0.6 0.3 - 1.2 mg/dL   GFR calc non Af Amer 55 (L) >60 mL/min   GFR calc Af Amer >60 >60 mL/min   Anion gap 8 5 - 15  Magnesium     Status: None   Collection Time: 07/27/16  9:52 AM  Result Value Ref Range   Magnesium 2.1 1.7 - 2.4 mg/dL  Protime-INR     Status: Abnormal   Collection Time: 07/27/16  9:52 AM  Result Value Ref Range   Prothrombin Time 31.0 (H) 11.4 - 15.2 seconds   INR 2.90   I-Stat Chem 8, ED     Status: Abnormal   Collection Time: 07/27/16 10:13 AM  Result Value Ref Range   Sodium 140 135 - 145 mmol/L   Potassium 4.1 3.5 - 5.1 mmol/L   Chloride 106 101 - 111 mmol/L   BUN 40 (H) 6 - 20 mg/dL   Creatinine, Ser 0.90 0.44 - 1.00 mg/dL   Glucose, Bld 126 (H) 65 - 99 mg/dL   Calcium, Ion 1.14 1.12 - 1.23 mmol/L   TCO2 23 0 - 100 mmol/L   Hemoglobin 12.9 12.0 - 15.0 g/dL   HCT 38.0 36.0 - 46.0 %  I-Stat CG4 Lactic Acid, ED     Status: None   Collection Time: 07/27/16 10:13 AM  Result Value Ref Range   Lactic Acid, Venous 1.62 0.5 - 1.9 mmol/L  I-Stat Venous Blood Gas, ED (order at [facility] and [facility] only)     Status: Abnormal   Collection Time: 07/27/16 10:13 AM  Result Value Ref Range   pH, Ven 7.371 (H) 7.250 - 7.300   pCO2, Ven 40.7 (L) 45.0 - 50.0 mmHg   pO2, Ven 76.0 (H) 31.0 - 45.0 mmHg   Bicarbonate 23.6 20.0 - 24.0 mEq/L   TCO2 25 0 - 100 mmol/L   O2 Saturation 95.0 %   Acid-base deficit 2.0 0.0 - 2.0 mmol/L   Patient temperature HIDE    Sample type VENOUS   I-stat troponin, ED     Status: None   Collection Time: 07/27/16 10:23 AM  Result Value Ref Range   Troponin i, poc 0.00 0.00 - 0.08 ng/mL   Comment 3          Magnesium     Status: Abnormal   Collection Time: 07/27/16 11:48 AM  Result Value Ref Range   Magnesium  2.8 (H) 1.7 - 2.4 mg/dL  Phosphorus     Status: None  Collection Time: 07/27/16 11:48 AM  Result Value Ref Range   Phosphorus 2.7 2.5 - 4.6 mg/dL  Troponin I-serum (0, 3, 6 hours)     Status: None   Collection Time: 07/27/16 11:48 AM  Result Value Ref Range   Troponin I <0.03 <0.03 ng/mL  TSH     Status: None   Collection Time: 07/27/16 11:48 AM  Result Value Ref Range   TSH 0.568 0.350 - 4.500 uIU/mL  T4, free     Status: None   Collection Time: 07/27/16 11:48 AM  Result Value Ref Range   Free T4 0.98 0.61 - 1.12 ng/dL    EKG: AF with CVR, IVCD, PVCs  IMAGING: Dg Chest Portable 1 View  Result Date: 07/27/2016 CLINICAL DATA:  Shortness of Breath EXAM: PORTABLE CHEST 1 VIEW COMPARISON:  May 25, 2016 and March 22, 2016 FINDINGS: There is generalized interstitial prominence, likely due to underlying pulmonary fibrotic type change. There is no frank edema or consolidation. Heart is upper normal in size with pulmonary vascularity indicating a mild degree of pulmonary venous hypertension. No adenopathy. Hiatal hernia evident. There is atherosclerotic calcification in the aorta. No bone lesions. IMPRESSION: Chronic interstitial prominence, likely due to chronic fibrotic type change. A degree of mild superimposed interstitial edema cannot be entirely excluded. There is evidence suggesting a degree of underlying pulmonary vascular congestion. No airspace consolidation. There is a hiatal hernia. There is aortic atherosclerosis. Electronically Signed   By: Lowella Grip III M.D.   On: 07/27/2016 10:25   2D Echo 06/24/16: Limited study secondary to incessant PVCs. EF estimated 45-50%. Grade 2 diastolic dysfunction consistent with pseudo-normal filling. Septal paradox secondary to intraventricular delay, moderate MR with moderate to severely dilated left atrium. Interestingly, no significant TR was noted to evaluate pulmonary pressures.   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 By her history she has probably been going in and out of AF since her hip surgery in April. She is on chronic Coumadin. Troponin negative x 2. She has improved symptomatically with rate control and IV Lasix (elevated BUN secondary to steroids). Will discuss further Rx with MD. She may convert spontaneously with treatment of her CHF and COPD.   Time Spent Directly with Patient: 11 minutes  Kerin Ransom, [place] beeper 07/27/2016, 1:40 PM   I have seen, examined and evaluated the patient this PM along with Mr. Rosalyn Gess, [facility].  After reviewing all the available data and chart, we discussed the patients laboratory, study & physical findings as well as symptoms in detail. I agree with his findings, examination as well as impression recommendations as per our discussion.    Mrs. Haut is a 75 year old woman with significant COPD, history of stroke on Coumadin who has been bothered by what sounds to COPD exacerbation for several weeks. Was seen by her her pulmonologist last week, and was supposed restarting steroids, but she did not get them until the Monday. She has been having a worsening cough and worsening dyspnea. She also was seen by  Cecilie Kicks, NP back in July with complaints of dyspnea. At that time she was noted to have significant PVCs on EKG following her recent back surgery. She had a repeat echocardiogram which showed a reduction in ejection fraction of an unknown etiology.  She now presents to emergency room  with worsening acute respiratory failure with hypoxia as well as orthopnea. She is noted to be in A. fib RVR with a rate in the 180s when she first arrived. She thinks she started feeling her tachycardia over the last couple days but was not sure because she's been having lots of palpitations consistent with her known PVCs. She definitely has had orthopnea and PND with some mild edema, but no real significant chest pain.  I suspect a reduced ejection fraction is probably related to her significant PVCs, but cannot be sure. She may have been having A. fib. I saw her today her rates were down in the 1 teens, but she is breathing very hard and tachypnea. I think her primary issues were driven by COPD, but then she developed acute combined systolic and diastolic heart failure in the setting of A. fib RVR. Mostly diastolic as her EF is not significantly reduced.  Thankfully, she is already on warfarin, so if necessary we could cardiovert. However I would prefer to see if she spontaneously converts on her own. For now I would continue IV diltiazem. I given digoxin load with 0.5 followed by 0.25 mg twice a day today just to slow her down. I do agree with IV Lasix as she clearly has some signs of volume overload.  Once we have stabilized her from a heart rate and COPD/CHF standpoint, would probably consider noninvasive ischemic evaluation with Myoview stress test either as inpatient or outpatient.    the patient is asked that we contact her primary neurologist to be involved in her care. This is Dr. Tera Partridge.  We will follow along.   Glenetta Hew, M.D., M.S. Interventional Cardiologist   Pager # [phone] Phone # [phone] [address]. [place], [place] [address]

## 2016-07-27 NOTE — ED Triage Notes (Signed)
Onset for 3 days increased shortness of breath and sever this morning. Increased Prednisone 2 days ago. Arrived via EMS. Pulse oximetry 88% room air placed on NRB mask 100% Lungs sounds clear per EMS HR 171 started Cardizem 10MG  IVP heart rate decreased to 61. BP initial 160/110 repeat 130/80. Chest pain 10/10 pressure decreased to 2/10 pressure. Denies chest pain upon arrival. Alert answering and following commands appropriate. [facility] EMS reported c02 of 32.

## 2016-07-27 NOTE — Progress Notes (Signed)
[facility] of [facility]

## 2016-07-27 NOTE — H&P (Signed)
History and Physical    [name] [id number] DOB: [birth year] DOA: 07/27/2016  PCP: Cathlean Cower, MD Patient coming from: Home  Chief Complaint: SOB  HPI: [name] is a 75 y.o. 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 Associated with intermittent bouts of a rapid heartbeat and palpitations. Patient states she's been "fighting a COPD exacerbation. "Cough is nonproductive but getting worse, more frequent. Patient was placed on a prednisone taper over the last 24 hours without any relief. Patient states that symptoms became acutely worse this morning and was unable to ambulate due to the severe shortness of breath and chest tightness. EMS was called by patient's friend who evaluated patient and stated that heart rate was in the 140s. Diltiazem 20 mg given with improvement of heart rate into the 60s. Breathing treatments given with improvement in respiratory status. Denies fevers, radiation of chest tightness to neck or arm, headache, and neck stiffness, dysuria, frequency, abdominal pain, diarrhea, medication, bright red blood per rectum, melena, hematemesis. On Coumadin for coagulopathy.    ED Course: Objective findings outlined below. In summation patient was started on a diltiazem drip to manage A. fib with RVR, placed on oxygen and given breathing treatments to manage hypoxia.  Review of Systems: As per HPI otherwise 10 point review of systems negative.   Ambulatory Status: no restrictions  Past Medical History:  Diagnosis Date  . Allergic rhinitis   . Allergy   . Anxiety   . Basal cell carcinoma   . Cataract    bil cataracts removed  . Clotting disorder ([place])   . COPD (chronic obstructive pulmonary disease) ([place])   . Depression 11/29/2013  . DI (detrusor  instability)   . Dysrhythmia   . Esophageal stricture   . GERD (gastroesophageal reflux disease)   . Hemorrhoids   . Hiatal hernia   . History of cerebrovascular accident   . History of kidney stones   . HLD (hyperlipidemia)   . HTN (hypertension)   . Nephrolithiasis    hx  . Osteoarthritis   . Pneumonia 2013  . Shingles since Nov 18, 2013   on right arm from shoulder to wrist  . Shortness of breath dyspnea    WITH EXERTION   . Status post dilation of esophageal narrowing   . Stroke [facility]) 2001   STROKES, TIA'S  . TIA (transient ischemic attack) last 2001   anticoagulation therapy on coumadin    Past Surgical History:  Procedure Laterality Date  . ANTERIOR AND POSTERIOR VAGINAL REPAIR  2008   A&P REPAIR-DR MCDIARMID  . ANTERIOR LATERAL LUMBAR FUSION 4 LEVELS Right 07/03/2015   Procedure: Right Lumbar one-two, Lumbar two-three, Lumbar three-four, Lumbar four-five Anterior lateral lumbar interbody fusion;  Surgeon: Erline Levine, MD;  Location: [facility];  Service: Neurosurgery;  Laterality: Right;  Right L1-2 L2-3 L3-4 L4-5 Anterior lateral lumbar interbody fusion  . BALLOON DILATION N/A 03/03/2014   Procedure: BALLOON DILATION;  Surgeon: Inda Castle, MD;  Location: [facility];  Service: Endoscopy;  Laterality: N/A;  . BLADDER SUSPENSION    . COLONOSCOPY    . ESOPHAGOGASTRODUODENOSCOPY N/A 03/03/2014   Procedure: ESOPHAGOGASTRODUODENOSCOPY (EGD);  Surgeon: Inda Castle, MD;  Location: [facility];  Service: Endoscopy;  Laterality: N/A;  . EXCISION OF BASAL CELL CA  2011  . GLUTEUS MINIMUS REPAIR Right 03/15/2016  Procedure: RIGHT HIP GLUTEUS MEDIUS TENDON REPAIR;  Surgeon: Paralee Cancel, MD;  Location: [facility];  Service: Orthopedics;  Laterality: Right;  . LUMBAR PERCUTANEOUS PEDICLE SCREW 4 LEVEL Right 07/03/2015   Procedure: Lumbar one-Sacral one Bilateral percutaneous pedicle screws;  Surgeon: Erline Levine, MD;  Location: [facility];  Service: Neurosurgery;   Laterality: Right;  L1-S1 Bilateral percutaneous pedicle screws  . RECTOCELE REPAIR  2008   A&P REPAIR -DR. MCDIARMID  . THUMB SURGERY Right 10-15 yrs ago  . UPPER GASTROINTESTINAL ENDOSCOPY    . VAGINAL HYSTERECTOMY  1983   NO BSO-DR. MABRY  . VARICOSE VEIN SURGERY      Social History   Social History  . Marital status: Married    Spouse name: N/A  . Number of children: 2  . Years of education: N/A   Occupational History  . retired Retired   Social History Main Topics  . Smoking status: Former Smoker    Packs/day: 3.00    Years: 42.00    Types: Cigarettes    Start date: 08/04/1954    Quit date: 12/06/1995  . Smokeless tobacco: Never Used  . Alcohol use 0.0 oz/week     Comment: occas very little  . Drug use: No  . Sexual activity: Not Currently    Birth control/ protection: Surgical     Comment: HYST-1st intercourse 80 yo-2 partners   Other Topics Concern  . Not on file   Social History Narrative   Retired- Dentist Pulmonary:   Originally from [place]. Has always lived in [place]. She has traveled to [place], [place], [place], [place], [place], [place]. Has been on multiple cruises. 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 She did use a face mask consistently to avoid chemical fume exposure. She may have only had an inhaled exposure a couple of times. She currently has a dog. Remote exposure to a parakeet. No mold or hot tub exposure. No asbestos exposure.     Allergies  Allergen Reactions  . Clarithromycin Other (See Comments)    REACTION: possible rash but could have been legionarres dz with rash  . Lipitor [Atorvastatin] Other (See Comments)    MUSCLE SPASMS  . Simvastatin Other (See Comments)    Muscle and joint pain  . Crestor [Rosuvastatin Calcium]     fagitue and joint pain, "NO STATINS"    Family History  Problem Relation Age of Onset  . COPD Mother     . Breast cancer Mother     mid 8's  . Uterine cancer Mother   . Emphysema Mother   . Breast cancer Maternal Aunt     x 3 aunts, post menopausal  . Colon cancer Neg Hx   . Esophageal cancer Neg Hx   . Rectal cancer Neg Hx   . Stomach cancer Neg Hx   . Pancreatic cancer Neg Hx     Prior to Admission medications   Medication Sig Start Date End Date Taking? Authorizing Provider  albuterol (PROVENTIL HFA;VENTOLIN HFA) 108 (90 BASE) MCG/ACT inhaler Inhale 1-2 puffs into the lungs every 6 (six) hours as needed for wheezing or shortness of breath. 01/06/15  Yes Elsie Stain, MD  amLODipine (NORVASC) 5 MG tablet Take 1 tablet (5 mg total) by mouth every morning. 02/23/16  Yes Biagio Borg, MD  azelastine (ASTELIN) 0.1 % nasal spray Place 2 sprays into the nose daily.  Use in each nostril as directed 03/01/16  Yes Javier Glazier, MD  benzonatate (TESSALON) 100 MG capsule Take 1 capsule (100 mg total) by mouth 3 (three) times daily as needed for cough. 03/22/16  Yes Tammy S Parrett, NP  budesonide-formoterol (SYMBICORT) 160-4.5 MCG/ACT inhaler Inhale 2 puffs into the lungs 2 (two) times daily. 03/01/16  Yes Javier Glazier, MD  Calcium Carbonate-Vitamin D (CALCIUM + D PO) Take 1 tablet by mouth 2 (two) times daily.    Yes Historical Provider, MD  citalopram (CELEXA) 20 MG tablet Take 1 tablet (20 mg total) by mouth daily. 11/12/15  Yes Biagio Borg, MD  cyclobenzaprine (FLEXERIL) 5 MG tablet Take 1 tablet (5 mg total) by mouth 3 (three) times daily as needed for muscle spasms. 08/19/15  Yes Biagio Borg, MD  ezetimibe (ZETIA) 10 MG tablet Take 1 tablet (10 mg total) by mouth daily. 07/26/16 10/24/16 Yes Fay Records, MD  fluticasone (FLONASE) 50 MCG/ACT nasal spray Place 1 spray into both nostrils 2 (two) times daily. 12/22/15  Yes Javier Glazier, MD  irbesartan (AVAPRO) 150 MG tablet Take 1 tablet (150 mg total) by mouth every morning. 03/28/16  Yes Biagio Borg, MD  Lidocaine 2 % GEL Apply 1  application topically daily as needed (SHINGLES).   Yes Historical Provider, MD  Magnesium 250 MG TABS Take 1 tablet by mouth daily as needed. Leg cramps   Yes Historical Provider, MD  Melatonin 1 MG TABS Take 3 mg by mouth at bedtime.    Yes Historical Provider, MD  meloxicam (MOBIC) 15 MG tablet Take 15 mg by mouth daily.   Yes Historical Provider, MD  mirabegron ER (MYRBETRIQ) 50 MG TB24 tablet Take 50 mg by mouth daily.   Yes Historical Provider, MD  montelukast (SINGULAIR) 10 MG tablet Take 10 mg by mouth daily.   Yes Historical Provider, MD  Multiple Vitamin (MULTIVITAMIN) tablet Take 1 tablet by mouth daily.     Yes Historical Provider, MD  omeprazole (PRILOSEC) 40 MG capsule Take 1 capsule (40 mg total) by mouth daily. 04/22/16  Yes Irene Shipper, MD  predniSONE (DELTASONE) 10 MG tablet 6 tabs daily x 3 days, 5 tabs x3 days, 4 tabs x3 days, 3 tabs x3 days, 2 tabs x3 days, 1 tab x3 days 07/25/16  Yes Javier Glazier, MD  Respiratory Therapy Supplies (FLUTTER) DEVI Use 2-3 times per day 01/06/15  Yes Elsie Stain, MD  Spacer/Aero-Holding Chambers (AEROCHAMBER Z-STAT PLUS) inhaler Use as instructed 12/22/15  Yes Javier Glazier, MD  Tiotropium Bromide Monohydrate (SPIRIVA RESPIMAT) 2.5 MCG/ACT AERS Inhale 2 puffs into the lungs daily. 02/19/16  Yes Javier Glazier, MD  warfarin (COUMADIN) 5 MG tablet Take 5-7.5 mg by mouth daily. 5 mg everyday except on Monday Wednesday Friday patient takes 7.5 mg   Yes Historical Provider, MD    Physical Exam: Vitals:   07/27/16 1029 07/27/16 1030 07/27/16 1045 07/27/16 1100  BP:  122/72 130/73 127/79  Pulse:   109 112  Resp:  24 23 22   Temp:      TempSrc:      SpO2:   96% 97%  Weight: 67.1 kg (148 lb)     Height: 5\' 4"  (1.626 m)        General: Anxious, resting in bed Eyes:  PERRL, EOMI, normal lids, iris ENT:  grossly normal hearing, lips & tongue, mmm Neck:  no LAD, masses or thyromegaly Cardiovascular:  Irregularly irregular,  tachycardic, difficult to appreciate cardiac sounds due to COPD exacerbation and breathing apparatus. 1+ bilateral lower 70 edema  Respiratory: Tachypnea, on nonrebreather, course breath sounds throughout with decreased air movement Abdomen:  soft, ntnd, NABS Skin:  no rash or induration seen on limited exam Musculoskeletal:  grossly normal tone BUE/BLE, good ROM, no bony abnormality Psychiatric:  grossly normal mood and affect, speech fluent and appropriate, AOx3 Neurologic:  CN 2-12 grossly intact, moves all extremities in coordinated fashion, sensation intact  Labs on Admission: I have personally reviewed following labs and imaging studies  CBC:  Recent Labs Lab 07/27/16 0948 07/27/16 1013  WBC 11.9*  --   HGB 12.1 12.9  HCT 38.1 38.0  MCV 81.6  --   PLT 240  --    Basic Metabolic Panel:  Recent Labs Lab 07/27/16 0952 07/27/16 1013  NA 139 140  K 4.1 4.1  CL 109 106  CO2 22  --   GLUCOSE 130* 126*  BUN 40* 40*  CREATININE 0.98 0.90  CALCIUM 9.1  --   MG 2.1  --    GFR: Estimated Creatinine Clearance: 50.1 mL/min (by C-G formula based on SCr of 0.9 mg/dL). Liver Function Tests:  Recent Labs Lab 07/27/16 0952  AST 40  ALT 42  ALKPHOS 72  BILITOT 0.6  PROT 6.0*  ALBUMIN 3.8   No results for input(s): LIPASE, AMYLASE in the last 168 hours. No results for input(s): AMMONIA in the last 168 hours. Coagulation Profile:  Recent Labs Lab 07/27/16 0952  INR 2.90   Cardiac Enzymes: No results for input(s): CKTOTAL, CKMB, CKMBINDEX, TROPONINI in the last 168 hours. BNP (last 3 results) No results for input(s): PROBNP in the last 8760 hours. HbA1C: No results for input(s): HGBA1C in the last 72 hours. CBG: No results for input(s): GLUCAP in the last 168 hours. Lipid Profile: No results for input(s): CHOL, HDL, LDLCALC, TRIG, CHOLHDL, LDLDIRECT in the last 72 hours. Thyroid Function Tests: No results for input(s): TSH, T4TOTAL, FREET4, T3FREE, THYROIDAB in  the last 72 hours. Anemia Panel: No results for input(s): VITAMINB12, FOLATE, FERRITIN, TIBC, IRON, RETICCTPCT in the last 72 hours. Urine analysis:    Component Value Date/Time   COLORURINE YELLOW 07/08/2015 1015   APPEARANCEUR CLEAR 07/08/2015 1015   LABSPEC 1.010 07/08/2015 1015   PHURINE 7.0 07/08/2015 1015   GLUCOSEU NEGATIVE 07/08/2015 1015   GLUCOSEU NEGATIVE 12/02/2014 1528   HGBUR NEGATIVE 07/08/2015 1015   BILIRUBINUR NEGATIVE 07/08/2015 1015   KETONESUR NEGATIVE 07/08/2015 1015   PROTEINUR NEGATIVE 07/08/2015 1015   UROBILINOGEN 0.2 07/08/2015 1015   NITRITE NEGATIVE 07/08/2015 1015   LEUKOCYTESUR NEGATIVE 07/08/2015 1015    Creatinine Clearance: Estimated Creatinine Clearance: 50.1 mL/min (by C-G formula based on SCr of 0.9 mg/dL).  Sepsis Labs: @LABRCNTIP (procalcitonin:4,lacticidven:4) )No results found for this or any previous visit (from the past 240 hour(s)).   Radiological Exams on Admission: Dg Chest Portable 1 View  Result Date: 07/27/2016 CLINICAL DATA:  Shortness of Breath EXAM: PORTABLE CHEST 1 VIEW COMPARISON:  May 25, 2016 and March 22, 2016 FINDINGS: There is generalized interstitial prominence, likely due to underlying pulmonary fibrotic type change. There is no frank edema or consolidation. Heart is upper normal in size with pulmonary vascularity indicating a mild degree of pulmonary venous hypertension. No adenopathy. Hiatal hernia evident. There is atherosclerotic calcification in the aorta. No bone lesions. IMPRESSION: Chronic interstitial prominence, likely due to chronic fibrotic type change. A degree of mild superimposed interstitial edema cannot  be entirely excluded. There is evidence suggesting a degree of underlying pulmonary vascular congestion. No airspace consolidation. There is a hiatal hernia. There is aortic atherosclerosis. Electronically Signed   By: Lowella Grip III M.D.   On: 07/27/2016 10:25      Assessment/Plan Principal  Problem:   Atrial fibrillation with rapid ventricular response (HCC) Active Problems:   Acute respiratory failure (HCC)   Combined congestive systolic and diastolic heart failure (HCC)   HLD (hyperlipidemia)   Essential hypertension   GERD (gastroesophageal reflux disease)   Insomnia   Depression with anxiety   Acute respiratory failure: Likely multifactorial including CHF exacerbation and COPD exacerbation. Patient with 5 pound weight gain over the last week with no home diuretic. Ongoing treatment for COPD exacerbation over the last couple of weeks with acute worsening. New O2 requirement. Tachypnea. CXR showing pulmonary vascular congestion without consolidation suggestive of pneumonia. Last Echo showing EF of [id number] grade 2 diastolic dysfunction. Medrol 125 and nebulizer treatments and magnesium given in ED. BNP greater than 700, VBG showing pH 7.37, PCO2 40.7, PaO2 76. - SDU - pro-calcitonin - Xopenex due to A. fib with RVR - solumedrol 60 every 12 (May need to titrate up or down on dose depending on competing medical problems including A. fib with RVR and acute respiratory failure) - Mucinex - Doxycycline - O2 when necessary - Strict I's and O's, daily weights - Lasix 20 IV twice a day (patient is Lasix nave) - Consider bblocker after acute illness resolves - continue Avapro - Continue singulair, Dulera, spiriva - CXR in am  Atrial fibrillation with RVR: New diagnoses though I suspect patient has had intermittent episodes of A. fib for some time. On chronic anticoagulation for clotting disorder. On diltiazem drip. Suspect initiation of prednisone 2 days ago along with worsening CHF set patient to current episode. CHA2DS2-VASc = 8. - Diltiazem drip - Cycle troponins - Cardiology consulted - Continue Coumadin  HLD: - continue Zetia  HTN: - contionue  avapro,  - hold norvasc while on dilt drip  Chronic pain: - continue flexeril  GERD/esophageal stricture/hiatal hernia: -  continue PPI  Depression/anxiety: - continue celexa - Add ativan PRN as pt very anxious  Insomnia: - continue melatonin  DVT prophylaxis: Coumadin  Code Status: FULL  Family Communication: husband and neice  Disposition Plan: pending improvement  Consults called: Cardiology  Admission status: inpt - SDU    Sherlynn Tourville J MD [facility]  If 7PM-7AM, please contact night-coverage [email or web address] Password [facility]  07/27/2016, 11:48 AM

## 2016-07-27 NOTE — Progress Notes (Signed)
ANTICOAGULATION CONSULT NOTE - Initial Consult  Pharmacy Consult for warfarin Indication: atrial fibrillation and stroke  Allergies  Allergen Reactions  . Clarithromycin Other (See Comments)    REACTION: possible rash but could have been legionarres dz with rash  . Lipitor [Atorvastatin] Other (See Comments)    MUSCLE SPASMS  . Simvastatin Other (See Comments)    Muscle and joint pain  . Crestor [Rosuvastatin Calcium]     fagitue and joint pain, "NO STATINS"    Patient Measurements: Height: 5\' 4"  (162.6 cm) Weight: 148 lb (67.1 kg) IBW/kg (Calculated) : 54.7  Vital Signs: Temp: 97.9 F (36.6 C) (08/23 0957) Temp Source: Oral (08/23 0957) BP: 127/79 (08/23 1100) Pulse Rate: 112 (08/23 1100)  Labs:  Recent Labs  07/27/16 0948 07/27/16 0952 07/27/16 1013  HGB 12.1  --  12.9  HCT 38.1  --  38.0  PLT 240  --   --   LABPROT  --  31.0*  --   INR  --  2.90  --   CREATININE  --  0.98 0.90    Estimated Creatinine Clearance: 50.1 mL/min (by C-G formula based on SCr of 0.9 mg/dL).   Medical History: Past Medical History:  Diagnosis Date  . Allergic rhinitis   . Allergy   . Anxiety   . Basal cell carcinoma   . Cataract    bil cataracts removed  . Clotting disorder ([place])   . COPD (chronic obstructive pulmonary disease) ([place])   . Depression 11/29/2013  . DI (detrusor instability)   . Dysrhythmia   . Esophageal stricture   . GERD (gastroesophageal reflux disease)   . Hemorrhoids   . Hiatal hernia   . History of cerebrovascular accident   . History of kidney stones   . HLD (hyperlipidemia)   . HTN (hypertension)   . Nephrolithiasis    hx  . Osteoarthritis   . Pneumonia 2013  . Shingles since Nov 18, 2013   on right arm from shoulder to wrist  . Shortness of breath dyspnea    WITH EXERTION   . Status post dilation of esophageal narrowing   . Stroke [facility]) 2001   STROKES, TIA'S  . TIA (transient ischemic attack) last 2001   anticoagulation therapy on  coumadin    Medications:  Warfarin PTA 5mg  TTSS, 7.5mg  MWF  Assessment: 71 yof presented to the ED with SOB. She is on chronic coumadin for history of CVA and also with afib. INR is therapeutic at 2.9. CBC is WNL, no bleeding noted. Also started on doxycycline which may increase the INR.   Goal of Therapy:  INR 2-3 Monitor platelets by anticoagulation protocol: Yes   Plan:  - Warfarin 7.5mg  PO x 1 tonight - Daily INR  Henleigh Robello, Rande Lawman 07/27/2016,12:19 PM

## 2016-07-27 NOTE — ED Provider Notes (Signed)
[facility] DEPT Provider Note   CSN: [id number] Arrival date & time: 07/27/16  [id number]     History   Chief Complaint No chief complaint on file.   HPI [name] is a 75 y.o. female.  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 Over the last several weeks. The patient states she has been fighting a COPD exacerbation. She states she has had progressively worsening cough and shortness of breath. The cough is nonproductive. She's been managed by her outpatient provider for this is been on a prednisone taper over the last 24 hours she has noticed a "tightening" sensation in her chest with palpitations. She also had acute worsening of her shortness of breath. Earlier this morning, she felt that she was unable to get around the house 2 or shortness of breath, so she called her friend. Her friend assessed her and called EMS. On EMS arrival, patient was reportedly with heart rate in the 140s. She is given a diltiazem bolus of 20 mg and her heart rate improved to the 60s. She was given albuterol with improvement in her work of breathing. Currently, she endorses severe shortness of breath and feels as if she cannot get enough air. She also has tightening in her chest.  Past Medical History:  Diagnosis Date  . Allergic rhinitis   . Allergy   . Anxiety   . Basal cell carcinoma   . Cataract    bil cataracts removed  . Clotting disorder ([place])   . COPD (chronic obstructive pulmonary disease) ([place])   . Depression 11/29/2013  . DI (detrusor instability)   . Dysrhythmia   . Esophageal stricture   . GERD (gastroesophageal reflux disease)   . Hemorrhoids   . Hiatal hernia   . History of cerebrovascular accident   . History of kidney stones   . HLD (hyperlipidemia)   . HTN (hypertension)   . Nephrolithiasis    hx  . Osteoarthritis   .  Pneumonia 2013  . Shingles since Nov 18, 2013   on right arm from shoulder to wrist  . Shortness of breath dyspnea    WITH EXERTION   . Status post dilation of esophageal narrowing   . Stroke [facility]) 2001   STROKES, TIA'S  . TIA (transient ischemic attack) last 2001   anticoagulation therapy on coumadin    Patient Active Problem List   Diagnosis Date Noted  . Gluteus medius tear 03/15/2016  . Hyperglycemia 11/12/2015  . Lumbar scoliosis 07/03/2015  . Stricture and stenosis of esophagus 03/03/2014  . On home oxygen therapy   . Swelling, mass, or lump in chest 12/19/2013  . Right wrist pain 12/19/2013  . Depression 11/29/2013  . Dysphagia, unspecified(787.20) 09/30/2013  . Lung nodule 08/01/2013  . Special screening for malignant neoplasms, colon 03/20/2013  . Lower back pain 01/21/2013  . Encounter for long-term (current) use of high-risk medication 02/13/2012  . Sleep related leg cramps 10/01/2011  . Basal cell carcinoma   . HTN (hypertension)   . Osteoarthritis   . Stroke ([place])   . DUB (dysfunctional uterine bleeding)   . Atrophic vaginitis   . Menopausal symptoms   . DI (detrusor instability)   . Preventative health care 08/07/2011  . Long term current use of anticoagulant 01/05/2011  . Transient cerebral ischemia 03/25/2009  . FATIGUE 06/05/2008  . ALLERGIC RHINITIS 04/24/2008  . NEPHROLITHIASIS, HX  OF 10/05/2007  . RECTOCELE WITHOUT MENTION OF UTERINE PROLAPSE 10/04/2007  . BLADDER SUSPENSION, HX OF 10/04/2007  . Hyperlipidemia 08/11/2007  . HEMORRHOIDS 08/11/2007  . Gold B Copd with acute bronchitis 08/11/2007  . GERD 08/11/2007  . IBS 08/11/2007  . CEREBROVASCULAR ACCIDENT, HX OF 08/11/2007  . FIBROCYSTIC BREAST DISEASE, HX OF 08/11/2007    Past Surgical History:  Procedure Laterality Date  . ANTERIOR AND POSTERIOR VAGINAL REPAIR  2008   A&P REPAIR-DR MCDIARMID  . ANTERIOR LATERAL LUMBAR FUSION 4 LEVELS Right 07/03/2015   Procedure: Right Lumbar one-two,  Lumbar two-three, Lumbar three-four, Lumbar four-five Anterior lateral lumbar interbody fusion;  Surgeon: Erline Levine, MD;  Location: [place] NEURO ORS;  Service: Neurosurgery;  Laterality: Right;  Right L1-2 L2-3 L3-4 L4-5 Anterior lateral lumbar interbody fusion  . BALLOON DILATION N/A 03/03/2014   Procedure: BALLOON DILATION;  Surgeon: Inda Castle, MD;  Location: [facility] ENDOSCOPY;  Service: Endoscopy;  Laterality: N/A;  . BLADDER SUSPENSION    . COLONOSCOPY    . ESOPHAGOGASTRODUODENOSCOPY N/A 03/03/2014   Procedure: ESOPHAGOGASTRODUODENOSCOPY (EGD);  Surgeon: Inda Castle, MD;  Location: [facility];  Service: Endoscopy;  Laterality: N/A;  . EXCISION OF BASAL CELL CA  2011  . GLUTEUS MINIMUS REPAIR Right 03/15/2016   Procedure: RIGHT HIP GLUTEUS MEDIUS TENDON REPAIR;  Surgeon: Paralee Cancel, MD;  Location: [facility] ORS;  Service: Orthopedics;  Laterality: Right;  . LUMBAR PERCUTANEOUS PEDICLE SCREW 4 LEVEL Right 07/03/2015   Procedure: Lumbar one-Sacral one Bilateral percutaneous pedicle screws;  Surgeon: Erline Levine, MD;  Location: [place] NEURO ORS;  Service: Neurosurgery;  Laterality: Right;  L1-S1 Bilateral percutaneous pedicle screws  . RECTOCELE REPAIR  2008   A&P REPAIR -DR. MCDIARMID  . THUMB SURGERY Right 10-15 yrs ago  . UPPER GASTROINTESTINAL ENDOSCOPY    . VAGINAL HYSTERECTOMY  1983   NO BSO-DR. MABRY  . VARICOSE VEIN SURGERY      OB History    Gravida Para Term Preterm AB Living   2 2 2     2    SAB TAB Ectopic Multiple Live Births                   Home Medications    Prior to Admission medications   Medication Sig Start Date End Date Taking? Authorizing Provider  albuterol (PROVENTIL HFA;VENTOLIN HFA) 108 (90 BASE) MCG/ACT inhaler Inhale 1-2 puffs into the lungs every 6 (six) hours as needed for wheezing or shortness of breath. 01/06/15   Elsie Stain, MD  amLODipine (NORVASC) 5 MG tablet Take 1 tablet (5 mg total) by mouth every morning. 02/23/16   Biagio Borg, MD  azelastine  (ASTELIN) 0.1 % nasal spray Place 2 sprays into the nose daily. Use in each nostril as directed 03/01/16   Javier Glazier, MD  benzonatate (TESSALON) 100 MG capsule Take 1 capsule (100 mg total) by mouth 3 (three) times daily as needed for cough. 03/22/16   Tammy S Parrett, NP  budesonide-formoterol (SYMBICORT) 160-4.5 MCG/ACT inhaler Inhale 2 puffs into the lungs 2 (two) times daily. 03/01/16   Javier Glazier, MD  Calcium Carbonate-Vitamin D (CALCIUM + D PO) Take 1 tablet by mouth 2 (two) times daily.     Historical Provider, MD  citalopram (CELEXA) 20 MG tablet Take 1 tablet (20 mg total) by mouth daily. 11/12/15   Biagio Borg, MD  cyclobenzaprine (FLEXERIL) 5 MG tablet Take 1 tablet (5 mg total) by mouth 3 (three) times daily  as needed for muscle spasms. 08/19/15   Biagio Borg, MD  ezetimibe (ZETIA) 10 MG tablet Take 1 tablet (10 mg total) by mouth daily. 07/26/16 10/24/16  Fay Records, MD  fluticasone (FLONASE) 50 MCG/ACT nasal spray Place 1 spray into both nostrils 2 (two) times daily. 12/22/15   Javier Glazier, MD  irbesartan (AVAPRO) 150 MG tablet Take 1 tablet (150 mg total) by mouth every morning. 03/28/16   Biagio Borg, MD  Lidocaine 2 % GEL Apply 1 application topically daily as needed (SHINGLES).    Historical Provider, MD  Magnesium 250 MG TABS Take 1 tablet by mouth daily as needed. Leg cramps    Historical Provider, MD  Melatonin 1 MG TABS Take 3 mg by mouth at bedtime.     Historical Provider, MD  meloxicam (MOBIC) 15 MG tablet Take 15 mg by mouth daily.    Historical Provider, MD  mirabegron ER (MYRBETRIQ) 50 MG TB24 tablet Take 50 mg by mouth daily.    Historical Provider, MD  montelukast (SINGULAIR) 10 MG tablet Take 10 mg by mouth daily.    Historical Provider, MD  Multiple Vitamin (MULTIVITAMIN) tablet Take 1 tablet by mouth daily.      Historical Provider, MD  omeprazole (PRILOSEC) 40 MG capsule Take 1 capsule (40 mg total) by mouth daily. 04/22/16   Irene Shipper, MD    predniSONE (DELTASONE) 10 MG tablet 6 tabs daily x 3 days, 5 tabs x3 days, 4 tabs x3 days, 3 tabs x3 days, 2 tabs x3 days, 1 tab x3 days 07/25/16   Javier Glazier, MD  Respiratory Therapy Supplies (FLUTTER) DEVI Use 2-3 times per day 01/06/15   Elsie Stain, MD  Spacer/Aero-Holding Chambers (AEROCHAMBER Z-STAT PLUS) inhaler Use as instructed 12/22/15   Javier Glazier, MD  Tiotropium Bromide Monohydrate (SPIRIVA RESPIMAT) 2.5 MCG/ACT AERS Inhale 2 puffs into the lungs daily. 02/19/16   Javier Glazier, MD  Vitamin Mixture (VITAMIN E COMPLETE PO) Take 1 capsule by mouth daily. 400iu    Historical Provider, MD  warfarin (COUMADIN) 5 MG tablet Use along with Lovenox to obtain therapeutic INR. Daily INR to monitor levels and dosing adjusted by pharmacy to obtain and maintain therapeutic INR of 2-3. Last dose in the hospital was 7.5 mg. 03/16/16   Danae Orleans, PA-C    Family History Family History  Problem Relation Age of Onset  . COPD Mother   . Breast cancer Mother     mid 71's  . Uterine cancer Mother   . Emphysema Mother   . Breast cancer Maternal Aunt     x 3 aunts, post menopausal  . Colon cancer Neg Hx   . Esophageal cancer Neg Hx   . Rectal cancer Neg Hx   . Stomach cancer Neg Hx   . Pancreatic cancer Neg Hx     Social History Social History  Substance Use Topics  . Smoking status: Former Smoker    Packs/day: 3.00    Years: 42.00    Types: Cigarettes    Start date: 08/04/1954    Quit date: 12/06/1995  . Smokeless tobacco: Never Used  . Alcohol use 0.0 oz/week     Comment: occas very little     Allergies   Clarithromycin; Lipitor [atorvastatin]; Simvastatin; and Crestor [rosuvastatin calcium]   Review of Systems Review of Systems  Constitutional: Positive for fatigue. Negative for chills and fever.  HENT: Negative for congestion and rhinorrhea.   Eyes: Negative  for visual disturbance.  Respiratory: Positive for cough, chest tightness, shortness of breath and  wheezing.   Cardiovascular: Positive for chest pain. Negative for leg swelling.  Gastrointestinal: Negative for abdominal pain, diarrhea, nausea and vomiting.  Genitourinary: Negative for dysuria and flank pain.  Musculoskeletal: Negative for neck pain and neck stiffness.  Skin: Negative for rash and wound.  Allergic/Immunologic: Negative for immunocompromised state.  Neurological: Positive for weakness. Negative for syncope and headaches.  All other systems reviewed and are negative.    Physical Exam Updated Vital Signs BP (!) 145/101 (BP Location: Left Arm)   Pulse (!) 137   Resp 22   LMP  (Exact Date)   SpO2 100%   Physical Exam  Constitutional: She is oriented to person, place, and time. She appears well-developed and well-nourished. She appears distressed.  HENT:  Head: Normocephalic and atraumatic.  Eyes: Conjunctivae are normal.  Neck: Neck supple.  Cardiovascular: Normal heart sounds.  An irregularly irregular rhythm present.  Extrasystoles are present. Tachycardia present.  Exam reveals no friction rub.   No murmur heard. Pulmonary/Chest: Accessory muscle usage present. Tachypnea noted. She is in respiratory distress. She has decreased breath sounds. She has wheezes in the right lower field and the left lower field. She has no rales.  Abdominal: She exhibits no distension.  Musculoskeletal: She exhibits no edema.  Neurological: She is alert and oriented to person, place, and time. She exhibits normal muscle tone.  Skin: Skin is warm. Capillary refill takes less than 2 seconds.  Psychiatric: She has a normal mood and affect.  Nursing note and vitals reviewed.    ED Treatments / Results  Labs (all labs ordered are listed, but only abnormal results are displayed) Labs Reviewed  CBC  BRAIN NATRIURETIC PEPTIDE  COMPREHENSIVE METABOLIC PANEL  MAGNESIUM  PROTIME-INR  I-STAT [place], ED  I-STAT CHEM 8, ED  I-STAT CG4 LACTIC ACID, ED  I-STAT VENOUS BLOOD GAS, ED     EKG  EKG Interpretation  Date/Time:  Wednesday July 27 2016 09:52:31 EDT Ventricular Rate:  131 PR Interval:    QRS Duration: 98 QT Interval:  290 QTC Calculation: 429 R Axis:   86 Text Interpretation:  Atrial fibrillation Ventricular premature complex Anterior infarct, old Nonspecific repol abnormality, diffuse leads Since last tracing: Atrial fibrillation is new, with rapid ventricular response Frequent PVCs ST depressions laterally, likely demand Confirmed by Kristen Fromm MD, Lysbeth Galas [phone]) on 07/27/2016 9:59:57 AM       Radiology No results found.  Procedures .Critical Care Performed by: Duffy Bruce Authorized by: Duffy Bruce   Critical care provider statement:    Critical care time (minutes):  40   Critical care time was exclusive of:  Separately billable procedures and treating other patients   Critical care was necessary to treat or prevent imminent or life-threatening deterioration of the following conditions:  Cardiac failure, circulatory failure and respiratory failure   Critical care was time spent personally by me on the following activities:  Blood draw for specimens, ordering and review of laboratory studies, ordering and review of radiographic studies, ordering and performing treatments and interventions, pulse oximetry, re-evaluation of patient's condition, review of old charts, obtaining history from patient or surrogate, examination of patient, evaluation of patient's response to treatment, discussions with primary provider, discussions with consultants and development of treatment plan with patient or surrogate   I assumed direction of critical care for this patient from another provider in my specialty: no     (including critical care time)  Medications Ordered in ED Medications  diltiazem (CARDIZEM) 1 mg/mL load via infusion 10 mg (not administered)    And  diltiazem (CARDIZEM) 100 mg in dextrose 5% 146mL (1 mg/mL) infusion (not administered)   magnesium sulfate IVPB 2 g 50 mL (not administered)  sodium chloride 0.9 % bolus 500 mL (500 mLs Intravenous New Bag/Given 07/27/16 0954)  albuterol (PROVENTIL,VENTOLIN) solution continuous neb (10 mg/hr Nebulization New Bag/Given 07/27/16 0959)  aspirin chewable tablet 324 mg (not administered)  albuterol (PROVENTIL) (2.5 MG/3ML) 0.083% nebulizer solution 5 mg (5 mg Nebulization Given by Other 07/27/16 0946)  methylPREDNISolone sodium succinate (SOLU-MEDROL) 125 mg/2 mL injection 125 mg (125 mg Intravenous Given 07/27/16 0958)  ipratropium (ATROVENT) nebulizer solution 1 mg (1 mg Nebulization Given by Other 07/27/16 0959)     Initial Impression / Assessment and Plan / ED Course  I have reviewed the triage vital signs and the nursing notes.  Pertinent labs & imaging results that were available during my care of the patient were reviewed by me and considered in my medical decision making (see chart for details).  Clinical Course  75 year old female with past medical history of COPD, clotting disorder, on Coumadin, hypertension, hyperlipidemia, who presents with severe shortness of breath and tachycardia. On arrival, patient noted to be in atrial fibrillation with rapid ventricular response. She also has marked increased work of breathing with diffuse wheezing after nebulizer treatment. At this time, differential includes COPD exacerbation, pneumonia, CHF, possibly pulmonary edema secondary to primary atrial fibrillation with rapid response. Will start diltiazem drip, continuous nebulizers, and IV steroids. Low threshold for BiPAP. At this time, airway is protected. She denies any fevers or signs of recent infection, but this will need to be assessed.   Heart rate improving with diltiazem bolus and drip. Patient has received continuous albuterol and ipratropium as well with improvement in her aeration work of breathing. Labwork is remarkable for mild leukocytosis, likely reactive. Troponin is negative.  INR is therapeutic at 2.9, making PE less likely. BNP is elevated at 724. Chest x-ray shows signs of hypervolemia and interstitial edema. No focal pneumonia. At this time, differential remains primary COPD exacerbation with reactive versus primary atrial fibrillation. Will continue diltiazem drip and admit to stepdown unit.   Final Clinical Impressions(s) / ED Diagnoses   Final diagnoses:  Atrial fibrillation with rapid ventricular response (HCC)  COPD exacerbation (HCC)  Respiratory distress  Elevated brain natriuretic peptide (BNP) level     Duffy Bruce, MD 07/27/16 1750

## 2016-07-28 ENCOUNTER — Inpatient Hospital Stay (HOSPITAL_COMMUNITY): Payer: Medicare Other

## 2016-07-28 ENCOUNTER — Ambulatory Visit: Payer: Medicare Other | Admitting: Cardiology

## 2016-07-28 DIAGNOSIS — I5040 Unspecified combined systolic (congestive) and diastolic (congestive) heart failure: Secondary | ICD-10-CM

## 2016-07-28 LAB — COMPREHENSIVE METABOLIC PANEL
ALT: 33 U/L (ref 14–54)
AST: 25 U/L (ref 15–41)
Albumin: 3.5 g/dL (ref 3.5–5.0)
Alkaline Phosphatase: 63 U/L (ref 38–126)
Anion gap: 8 (ref 5–15)
BUN: 28 mg/dL — ABNORMAL HIGH (ref 6–20)
CO2: 23 mmol/L (ref 22–32)
Calcium: 8.9 mg/dL (ref 8.9–10.3)
Chloride: 108 mmol/L (ref 101–111)
Creatinine, Ser: 0.98 mg/dL (ref 0.44–1.00)
GFR calc Af Amer: >60 mL/min (ref >60)
GFR calc non Af Amer: 55 mL/min — ABNORMAL LOW (ref >60)
Glucose, Bld: 144 mg/dL — ABNORMAL HIGH (ref 65–99)
Potassium: 4 mmol/L (ref 3.5–5.1)
Sodium: 139 mmol/L (ref 135–145)
Total Bilirubin: 0.5 mg/dL (ref 0.3–1.2)
Total Protein: 5.7 g/dL — ABNORMAL LOW (ref 6.5–8.1)

## 2016-07-28 LAB — CBC
HCT: 35.4 % — ABNORMAL LOW (ref 36.0–46.0)
Hemoglobin: 11 g/dL — ABNORMAL LOW (ref 12.0–15.0)
MCH: 25.3 pg — ABNORMAL LOW (ref 26.0–34.0)
MCHC: 31.1 g/dL (ref 30.0–36.0)
MCV: 81.6 fL (ref 78.0–100.0)
Platelets: 210 10*3/uL (ref 150–400)
RBC: 4.34 MIL/uL (ref 3.87–5.11)
RDW: 15.6 % — ABNORMAL HIGH (ref 11.5–15.5)
WBC: 5.6 10*3/uL (ref 4.0–10.5)

## 2016-07-28 LAB — PROTIME-INR
INR: 3.6
Prothrombin Time: 36.8 seconds — ABNORMAL HIGH (ref 11.4–15.2)

## 2016-07-28 MED ORDER — MIRABEGRON ER 25 MG PO TB24
50.0000 mg | ORAL_TABLET | Freq: Every day | ORAL | Status: DC
  Administered 2016-07-28 – 2016-08-01 (×5): 50 mg via ORAL
  Filled 2016-07-28: qty 1
  Filled 2016-07-28: qty 2
  Filled 2016-07-28: qty 1
  Filled 2016-07-28 (×2): qty 2

## 2016-07-28 NOTE — Progress Notes (Signed)
Patient Name: [name] Date of Encounter: 07/28/2016  PCP: Cathlean Cower, MD ATTENDING: Allie Bossier, MD CARDIOLOGIST: Lizbeth Bark, MD PULMONOLOGIST: Calton Dach, MD  Hospital Problem List     Principal Problem:   Acute respiratory failure [facility]) Active Problems:   New onset atrial fibrillation [facility])   Atrial fibrillation with rapid ventricular response ([place])   Gold B Copd with acute bronchitis   Long term current use of Coumadin   Combined congestive systolic and diastolic heart failure (HCC)   HLD (hyperlipidemia)   Essential hypertension   GERD (gastroesophageal reflux disease)   Insomnia   Depression with anxiety   Respiratory distress    Patient Summary     75 year old woman with a history of COPD, history of stroke on chronic Coumadin, and frequent PVCs noted on recent EKG who now presented to [facility] on 8/20 02/22/2016 with restoration failure. Accommodation of COPD exacerbation along with CHF. This was calculated by A. fib RVR with rates 180 BPM range. Initially rate was reduced with IV diltiazem and then started on diltiazem drip. Has been on IV Lasix and steroids. This is a new diagnosis of A. fib.  Subjective   Feels much better today. Breathing is improved significantly. Does note that she has had significant urine output.  Inpatient Medications    . citalopram  20 mg Oral Daily  . doxycycline (VIBRAMYCIN) IV  100 mg Intravenous Q12H  . ezetimibe  10 mg Oral Daily  . furosemide  20 mg Intravenous BID  . guaiFENesin  1,200 mg Oral BID  . irbesartan  150 mg Oral q morning - 10a  . levalbuterol  1.25 mg Nebulization Q6H  . methylPREDNISolone (SOLU-MEDROL) injection  60 mg Intravenous Q12H  . mirabegron ER  50 mg Oral Daily  . mometasone-formoterol  2 puff Inhalation BID  . montelukast  10 mg Oral Daily  . pantoprazole  80 mg Oral Daily  . sodium chloride flush  3 mL Intravenous Q12H  . sodium chloride flush  3 mL Intravenous Q12H  . tiotropium  1 capsule  Inhalation Daily  . Warfarin - Pharmacist Dosing Inpatient   Does not apply q1800    Vital Signs    Vitals:   07/28/16 0300 07/28/16 0400 07/28/16 0500 07/28/16 0600  BP:    108/78  Pulse: 77 (!) 42 82 (!) 44  Resp: 15 (!) 22 17 17   Temp:  98.2 F (36.8 C)    TempSrc:  Oral    SpO2: 95% 96% 96% 98%  Weight:   149 lb 7.6 oz (67.8 kg)   Height:        Intake/Output Summary (Last 24 hours) at 07/28/16 0817 Last data filed at 07/28/16 0700  Gross per 24 hour  Intake           966.04 ml  Output             1800 ml  Net          -833.96 ml   Filed Weights   07/27/16 1029 07/28/16 0500  Weight: 148 lb (67.1 kg) 149 lb 7.6 oz (67.8 kg)    Physical Exam    GEN: Notably improved level of respiratory distress. Much less work of breathing. HEENT: normal.  Neck: Supple, Mild JVD roughly 10 cmH20, carotid bruits, or masses. Cardiac: Irregularly irregular rhythm, no murmurs, rubs, or gallops. No clubbing, cyanosis, edema.  Radials/DP/PT 2+ and equal bilaterally.  Respiratory: Accessory muscle use, but nonlabored. Still  has pursed lip breathing. Diffuse rhonchorous breath sounds with extremity wheezing. Notably improved air movement compared to yesterday. GI: Soft, nontender, nondistended, BS + x 4. Skin: warm and dry, no rash. Neuro:  Strength and sensation are intact. Psych: Normal affect. Seems better spirits today  Labs    CBC  Recent Labs  07/27/16 0948 07/27/16 1013 07/28/16 0421  WBC 11.9*  --  5.6  HGB 12.1 12.9 11.0*  HCT 38.1 38.0 35.4*  MCV 81.6  --  81.6  PLT 240  --  [id number]   Basic Metabolic Panel  Recent Labs  07/27/16 0952 07/27/16 1013 07/27/16 1148 07/28/16 0421  NA 139 140  --  139  K 4.1 4.1  --  4.0  CL 109 106  --  108  CO2 22  --   --  23  GLUCOSE 130* 126*  --  144*  BUN 40* 40*  --  28*  CREATININE 0.98 0.90  --  0.98  CALCIUM 9.1  --   --  8.9  MG 2.1  --  2.8*  --   PHOS  --   --  2.7  --    Liver Function Tests  Recent Labs   07/27/16 0952 07/28/16 0421  AST 40 25  ALT 42 33  ALKPHOS 72 63  BILITOT 0.6 0.5  PROT 6.0* 5.7*  ALBUMIN 3.8 3.5   No results for input(s): LIPASE, AMYLASE in the last 72 hours. Cardiac Enzymes  Recent Labs  07/27/16 1148 07/27/16 1441 07/27/16 1741  TROPONINI <0.03 <0.03 <0.03   BNP Invalid input(s): POCBNP D-Dimer No results for input(s): DDIMER in the last 72 hours. Hemoglobin A1C No results for input(s): HGBA1C in the last 72 hours. Fasting Lipid Panel No results for input(s): CHOL, HDL, LDLCALC, TRIG, CHOLHDL, LDLDIRECT in the last 72 hours. Thyroid Function Tests  Recent Labs  07/27/16 1148  TSH 0.568    Telemetry    Currently A. fib with rates of 80s to 90s with PVCs  ECG    Pending this morning  Radiology    Dg Chest 2 View  Result Date: 07/28/2016 CLINICAL DATA:  Congestive heart failure. Shortness of breath for 3 weeks EXAM: CHEST  2 VIEW COMPARISON:  July 27, 2016 FINDINGS: There remains generalized interstitial prominence without airspace consolidation. There are bilateral pleural effusions currently with cardiomegaly and pulmonary venous hypertension. No adenopathy. There is atherosclerotic calcification in the aorta. There is postoperative change in the upper lumbar spine. Bones appear osteoporotic. IMPRESSION: Evidence of a degree of congestive heart failure superimposed on interstitial fibrosis. No airspace consolidation. There is aortic atherosclerosis. Bones appear osteoporotic. Electronically Signed   By: Lowella Grip III M.D.   On: 07/28/2016 07:17   Dg Chest Portable 1 View  Result Date: 07/27/2016 CLINICAL DATA:  Shortness of Breath EXAM: PORTABLE CHEST 1 VIEW COMPARISON:  May 25, 2016 and March 22, 2016 FINDINGS: There is generalized interstitial prominence, likely due to underlying pulmonary fibrotic type change. There is no frank edema or consolidation. Heart is upper normal in size with pulmonary vascularity indicating a mild  degree of pulmonary venous hypertension. No adenopathy. Hiatal hernia evident. There is atherosclerotic calcification in the aorta. No bone lesions. IMPRESSION: Chronic interstitial prominence, likely due to chronic fibrotic type change. A degree of mild superimposed interstitial edema cannot be entirely excluded. There is evidence suggesting a degree of underlying pulmonary vascular congestion. No airspace consolidation. There is a hiatal hernia. There is aortic atherosclerosis. Electronically Signed  By: Lowella Grip III M.D.   On: 07/27/2016 10:25    Assessment & Plan    Principal Problem:   Acute respiratory failure ([place]) - combined COPD and CHF complicated by A. fib RVR.  Notably improved. Much less tachypnea. Still wheezing.  Continue COPD and CHF treatment along with rate control for A. fib. Active Problems:   New onset atrial fibrillation (HCC) -- Long term current use of CoumadinFor chronic PTE   Atrial fibrillation with rapid ventricular response ([place])  Rates have improved, still remains in A. fib mostly in the 80s on my exam. PVCs also present.  As she has still had some tachycardia, would continue with the IV diltiazem drip for now with hopes for her to spontaneously convert. - Would convert to by mouth diltiazem equivalent 24 hour dosage if she were to convert.  She responded well to the IV digoxin, could use this if she has bursts of tachycardia.    Gold B Copd with acute bronchitis - per primary team; on Solu-Medrol and antibiotics.      Combined congestive systolic and diastolic heart failure (HCC) -- echo pending. Should be more accurate as far as estimating her EF, would not necessarily be able to determine diastolic function because of A. fib.    continue IV Lasix, would DC normal saline infusion in order to allow for improved net output  Continue ARB for now as long as blood pressure tolerates.    Essential hypertension - maintaining stable blood pressure.  Tolerating diltiazem infusion.     Signed, Glenetta Hew, M.D., M.S. Interventional Cardiologist   Pager # [phone] Phone # [phone] [address] [place], [place] [address]

## 2016-07-28 NOTE — Progress Notes (Signed)
PROGRESS NOTE    [name]  [id number] DOB: [birth year] DOA: 07/27/2016 PCP: Cathlean Cower, MD   Brief Narrative:  75 y.o. 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 Associated with intermittent bouts of a rapid heartbeat and palpitations. Patient states she's been "fighting a COPD exacerbation. "Cough is nonproductive but getting worse, more frequent. Patient was placed on a prednisone taper over the last 24 hours without any relief. Patient states that symptoms became acutely worse this morning and was unable to ambulate due to the severe shortness of breath and chest tightness. EMS was called by patient's friend who evaluated patient and stated that heart rate was in the 140s. Diltiazem 20 mg given with improvement of heart rate into the 60s. Breathing treatments given with improvement in respiratory status. Denies fevers, radiation of chest tightness to neck or arm, headache, and neck stiffness, dysuria, frequency, abdominal pain, diarrhea, medication, bright red blood per rectum, melena, hematemesis. On Coumadin for coagulopathy   Subjective: 8/24  A/O 4, did not know she had CHF. states had COPD which she attended Pulmonary Rehabilitation for which upon completion she was able to come off of home O2. States usually negative expiratory wheezing. States she had been on an event monitor for 2 weeks in the past which had not showed A. fib. Had noticed that over the last 2 weeks began to have increased lower extremity edema, as well as increasing SOB. Patient attributed SOB to being out of shape post surgery. Sees Dr. Harrington Challenger cardiology as her regular cardiologist.   Assessment & Plan:   Principal Problem:   Acute respiratory failure ([place]) Active Problems:   Gold B Copd with acute bronchitis   Long term current use of  Coumadin   Atrial fibrillation with rapid ventricular response ([facility])   Combined congestive systolic and diastolic heart failure ([place])   HLD (hyperlipidemia)   Essential hypertension   GERD (gastroesophageal reflux disease)   Insomnia   Depression with anxiety   New onset atrial fibrillation (H[facility])   Respiratory distress   Acute respiratory failure:  -Likely multifactorial including CHF exacerbation and COPD exacerbation. Patient with 5 pound weight gain over the last week with no home diuretic.  - congestion without consolidation suggestive of pneumonia. Last Echo showing EF of [id number] grade 2 diastolic dysfunction.  - Xopenex QID - Solumedrol 60 every 12  - Mucinex - Doxycycline: Complete 5 day course - Titrate O2 to maintain SPO2 89-93%  - Strict I's and O's, daily weights - Lasix 20 IV twice a day (patient is Lasix nave) - Consider bblocker after acute illness resolves - continue Avapro - Continue singulair, Dulera, spiriva  Atrial fibrillation with RVR:(New diagnoses:CHA2DS2-VASc = 8.)  -By patient's description of symptoms most likely in A. fib for some time -On chronic anticoagulation for clotting disorder.  -On diltiazem drip.  - Diltiazem drip - Cycle troponins - Cardiology consulted  Systolic and Diastolic CHF -Lasix 20 mg BID -Strict in and out since admission -536ml -Daily weight Filed Weights   07/27/16 1029 07/28/16 0500  Weight: 67.1 kg (148 lb) 67.8 kg (149 lb 7.6 oz)    HLD: - continue Zetia  HTN: - contionue  avapro,  - hold norvasc  Chronic pain: - continue flexeril  GERD/esophageal stricture/hiatal hernia: - continue PPI  Depression/Anxiety: - continue celexa - Add ativan PRN as pt very anxious  Insomnia: - continue melatonin   DVT prophylaxis: Coumadin for dose Code Status: Full Family Communication: None Disposition Plan: Cardiology   Consultants:  DR Leonie [facility]  Procedures/Significant Events:  7/21  Echocardiogram; - LVEF= 45%-50%. Diffuse hypokinesis. - (grade 2 diastolic dysfunction). - Ventricular septum: Septal motion showed paradox. C/W intraventricular conduction delay. - Mitral valve: moderate regurgitation - Left atrium: moderately to severely dilated.    Cultures None  Antimicrobials: None   Devices None   LINES / TUBES:  None    Continuous Infusions: . diltiazem (CARDIZEM) infusion 10 mg/hr (07/28/16 1210)     Objective: Vitals:   07/28/16 0826 07/28/16 0855 07/28/16 1137 07/28/16 1456  BP: 128/74  (!) 148/95   Pulse: 78  (!) 106   Resp: 19  20   Temp: 97.6 F (36.4 C)  98.1 F (36.7 C)   TempSrc: Oral  Oral   SpO2: 96% 100% 97% 96%  Weight:      Height:        Intake/Output Summary (Last 24 hours) at 07/28/16 1525 Last data filed at 07/28/16 [id number]  Gross per 24 hour  Intake           756.04 ml  Output             1800 ml  Net         -1043.96 ml   Filed Weights   07/27/16 1029 07/28/16 0500  Weight: 67.1 kg (148 lb) 67.8 kg (149 lb 7.6 oz)    Examination:  General: A/O 4, NAD, positive cute respiratory distress (requiring O2 to maintain adequate SPO2) Eyes: negative scleral hemorrhage, negative anisocoria, negative icterus ENT: Negative Runny nose, negative gingival bleeding, Neck:  Negative scars, masses, torticollis, lymphadenopathy, JVD Lungs: diffuse expiratory wheezing with poor air movement, negative crackles  Cardiovascular: Irregular irregular rhythm and rate, negative murmur gallop or rub normal S1 and S2 Abdomen: negative abdominal pain, nondistended, positive soft, bowel sounds, no rebound, no ascites, no appreciable mass Extremities: No significant cyanosis, clubbing. Positive bilateral pedal edema +1 to knees. Skin: Negative rashes, lesions, ulcers Psychiatric:  Negative depression, negative anxiety, negative fatigue, negative mania  Central nervous system:  Cranial nerves II through XII intact, tongue/uvula midline, all  extremities muscle strength 5/5, sensation intact throughout,  negative dysarthria, negative expressive aphasia, negative receptive aphasia.  .     Data Reviewed: Care during the described time interval was provided by me .  I have reviewed this patient's available data, including medical history, events of note, physical examination, and all test results as part of my evaluation. I have personally reviewed and interpreted all radiology studies.  CBC:  Recent Labs Lab 07/27/16 0948 07/27/16 1013 07/28/16 0421  WBC 11.9*  --  5.6  HGB 12.1 12.9 11.0*  HCT 38.1 38.0 35.4*  MCV 81.6  --  81.6  PLT 240  --  [id number]   Basic Metabolic Panel:  Recent Labs Lab 07/27/16 0952 07/27/16 1013 07/27/16 1148 07/28/16 0421  NA 139 140  --  139  K 4.1 4.1  --  4.0  CL 109 106  --  108  CO2 22  --   --  23  GLUCOSE 130* 126*  --  144*  BUN 40* 40*  --  28*  CREATININE 0.98 0.90  --  0.98  CALCIUM 9.1  --   --  8.9  MG 2.1  --  2.8*  --   PHOS  --   --  2.7  --    GFR: Estimated Creatinine Clearance: 46.2 mL/min (by C-G formula based on SCr of 0.98 mg/dL). Liver Function Tests:  Recent Labs Lab 07/27/16 0952 07/28/16 0421  AST 40 25  ALT 42 33  ALKPHOS 72 63  BILITOT 0.6 0.5  PROT 6.0* 5.7*  ALBUMIN 3.8 3.5   No results for input(s): LIPASE, AMYLASE in the last 168 hours. No results for input(s): AMMONIA in the last 168 hours. Coagulation Profile:  Recent Labs Lab 07/27/16 0952 07/28/16 0421  INR 2.90 3.60   Cardiac Enzymes:  Recent Labs Lab 07/27/16 1148 07/27/16 1441 07/27/16 1741  TROPONINI <0.03 <0.03 <0.03   BNP (last 3 results) No results for input(s): PROBNP in the last 8760 hours. HbA1C: No results for input(s): HGBA1C in the last 72 hours. CBG: No results for input(s): GLUCAP in the last 168 hours. Lipid Profile: No results for input(s): CHOL, HDL, LDLCALC, TRIG, CHOLHDL, LDLDIRECT in the last 72 hours. Thyroid Function Tests:  Recent Labs   07/27/16 1148  TSH 0.568  FREET4 0.98   Anemia Panel: No results for input(s): VITAMINB12, FOLATE, FERRITIN, TIBC, IRON, RETICCTPCT in the last 72 hours. Urine analysis:    Component Value Date/Time   COLORURINE YELLOW 07/08/2015 1015   APPEARANCEUR CLEAR 07/08/2015 1015   LABSPEC 1.010 07/08/2015 1015   PHURINE 7.0 07/08/2015 1015   GLUCOSEU NEGATIVE 07/08/2015 1015   GLUCOSEU NEGATIVE 12/02/2014 1528   HGBUR NEGATIVE 07/08/2015 1015   BILIRUBINUR NEGATIVE 07/08/2015 1015   KETONESUR NEGATIVE 07/08/2015 1015   PROTEINUR NEGATIVE 07/08/2015 1015   UROBILINOGEN 0.2 07/08/2015 1015   NITRITE NEGATIVE 07/08/2015 1015   LEUKOCYTESUR NEGATIVE 07/08/2015 1015   Sepsis Labs: @LABRCNTIP (procalcitonin:4,lacticidven:4)  ) Recent Results (from the past 240 hour(s))  MRSA PCR Screening     Status: None   Collection Time: 07/27/16  6:28 PM  Result Value Ref Range Status   MRSA by PCR NEGATIVE NEGATIVE Final    Comment:        The GeneXpert MRSA Assay (FDA approved for NASAL specimens only), is one component of a comprehensive MRSA colonization surveillance program. It is not intended to diagnose MRSA infection nor to guide or monitor treatment for MRSA infections.          Radiology Studies: Dg Chest 2 View  Result Date: 07/28/2016 CLINICAL DATA:  Congestive heart failure. Shortness of breath for 3 weeks EXAM: CHEST  2 VIEW COMPARISON:  July 27, 2016 FINDINGS: There remains generalized interstitial prominence without airspace consolidation. There are bilateral pleural effusions currently with cardiomegaly and pulmonary venous hypertension. No adenopathy. There is atherosclerotic calcification in the aorta. There is postoperative change in the upper lumbar spine. Bones appear osteoporotic. IMPRESSION: Evidence of a degree of congestive heart failure superimposed on interstitial fibrosis. No airspace consolidation. There is aortic atherosclerosis. Bones appear osteoporotic.  Electronically Signed   By: Lowella Grip III M.D.   On: 07/28/2016 07:17   Dg Chest Portable 1 View  Result Date: 07/27/2016 CLINICAL DATA:  Shortness of Breath EXAM: PORTABLE CHEST 1 VIEW COMPARISON:  May 25, 2016 and March 22, 2016 FINDINGS: There is generalized interstitial prominence, likely due to underlying pulmonary fibrotic type change. There is no frank edema or consolidation. Heart is upper normal in size with pulmonary vascularity indicating a mild degree of pulmonary venous hypertension. No adenopathy. Hiatal hernia evident. There is atherosclerotic calcification in the aorta. No bone lesions. IMPRESSION: Chronic interstitial prominence, likely due to chronic fibrotic type change. A  degree of mild superimposed interstitial edema cannot be entirely excluded. There is evidence suggesting a degree of underlying pulmonary vascular congestion. No airspace consolidation. There is a hiatal hernia. There is aortic atherosclerosis. Electronically Signed   By: Lowella Grip III M.D.   On: 07/27/2016 10:25        Scheduled Meds: . citalopram  20 mg Oral Daily  . doxycycline (VIBRAMYCIN) IV  100 mg Intravenous Q12H  . ezetimibe  10 mg Oral Daily  . furosemide  20 mg Intravenous BID  . guaiFENesin  1,200 mg Oral BID  . irbesartan  150 mg Oral q morning - 10a  . levalbuterol  1.25 mg Nebulization Q6H  . methylPREDNISolone (SOLU-MEDROL) injection  60 mg Intravenous Q12H  . mirabegron ER  50 mg Oral Daily  . mometasone-formoterol  2 puff Inhalation BID  . montelukast  10 mg Oral Daily  . pantoprazole  80 mg Oral Daily  . sodium chloride flush  3 mL Intravenous Q12H  . sodium chloride flush  3 mL Intravenous Q12H  . tiotropium  1 capsule Inhalation Daily  . Warfarin - Pharmacist Dosing Inpatient   Does not apply q1800   Continuous Infusions: . diltiazem (CARDIZEM) infusion 10 mg/hr (07/28/16 1210)     LOS: 1 day    Time spent: 40 minutes    WOODS, Geraldo Docker, MD [facility] Pager [phone]   If 7PM-7AM, please contact night-coverage [email or web address] Password [facility] 07/28/2016, 3:25 PM

## 2016-07-28 NOTE — Progress Notes (Signed)
ANTICOAGULATION CONSULT NOTE - Initial Consult  Pharmacy Consult for warfarin Indication: atrial fibrillation and stroke  Allergies  Allergen Reactions  . Clarithromycin Other (See Comments)    REACTION: possible rash but could have been legionarres dz with rash  . Lipitor [Atorvastatin] Other (See Comments)    MUSCLE SPASMS  . Simvastatin Other (See Comments)    Muscle and joint pain  . Crestor [Rosuvastatin Calcium]     fagitue and joint pain, "NO STATINS"    Patient Measurements: Height: 5\' 4"  (162.6 cm) Weight: 149 lb 7.6 oz (67.8 kg) IBW/kg (Calculated) : 54.7  Vital Signs: Temp: 98.1 F (36.7 C) (08/24 1137) Temp Source: Oral (08/24 1137) BP: 148/95 (08/24 1137) Pulse Rate: 106 (08/24 1137)  Labs:  Recent Labs  07/27/16 0948 07/27/16 0952 07/27/16 1013 07/27/16 1148 07/27/16 1441 07/27/16 1741 07/28/16 0421  HGB 12.1  --  12.9  --   --   --  11.0*  HCT 38.1  --  38.0  --   --   --  35.4*  PLT 240  --   --   --   --   --  210  LABPROT  --  31.0*  --   --   --   --  36.8*  INR  --  2.90  --   --   --   --  3.60  CREATININE  --  0.98 0.90  --   --   --  0.98  TROPONINI  --   --   --  <0.03 <0.03 <0.03  --     Estimated Creatinine Clearance: 46.2 mL/min (by C-G formula based on SCr of 0.98 mg/dL).   Medications:  Warfarin PTA 5mg  TTSS, 7.5mg  MWF  Assessment: 18 yof presented to the ED with SOB. She is on chronic coumadin for history of CVA, now with new onset afib. INR on admission was therapeutic at 2.9. INR now supratherapeutic at 3.6 after one 7.5mg  warfarin dose and initiation of doxycycline. INR likely elevated in setting of acute illness as well as potential DDI with doxycycline. Hgb downtrending slowly from 12.1 to 11 and platelets within normal limits. No bleeding noted per nursing.   Goal of Therapy:  INR 2-3 Monitor platelets by anticoagulation protocol: Yes   Plan:  - Hold warfarin tonight x1 - Daily INR - Monitor for s/s bleeding    Argie Ramming, PharmD Pharmacy Resident  Pager [phone] 07/28/16 12:10 PM

## 2016-07-29 ENCOUNTER — Ambulatory Visit: Payer: Medicare Other

## 2016-07-29 DIAGNOSIS — G4700 Insomnia, unspecified: Secondary | ICD-10-CM

## 2016-07-29 DIAGNOSIS — G894 Chronic pain syndrome: Secondary | ICD-10-CM

## 2016-07-29 DIAGNOSIS — I5043 Acute on chronic combined systolic (congestive) and diastolic (congestive) heart failure: Secondary | ICD-10-CM

## 2016-07-29 LAB — PROTIME-INR
INR: 4.55
Prothrombin Time: 44.4 seconds — ABNORMAL HIGH (ref 11.4–15.2)

## 2016-07-29 LAB — PROCALCITONIN: Procalcitonin: <0.1 ng/mL

## 2016-07-29 MED ORDER — POLYETHYLENE GLYCOL 3350 17 G PO PACK
17.0000 g | PACK | Freq: Every day | ORAL | Status: DC | PRN
  Administered 2016-07-29 – 2016-07-30 (×2): 17 g via ORAL
  Filled 2016-07-29 (×3): qty 1

## 2016-07-29 MED ORDER — DIGOXIN 0.25 MG/ML IJ SOLN
0.2500 mg | Freq: Once | INTRAMUSCULAR | Status: AC
  Administered 2016-07-29: 0.25 mg via INTRAVENOUS
  Filled 2016-07-29: qty 2

## 2016-07-29 MED ORDER — DIGOXIN 125 MCG PO TABS
0.1250 mg | ORAL_TABLET | Freq: Every day | ORAL | Status: DC
Start: 2016-07-30 — End: 2016-08-02
  Administered 2016-07-30 – 2016-08-02 (×4): 0.125 mg via ORAL
  Filled 2016-07-29 (×4): qty 1

## 2016-07-29 MED ORDER — DILTIAZEM HCL 30 MG PO TABS
90.0000 mg | ORAL_TABLET | Freq: Three times a day (TID) | ORAL | Status: DC
  Administered 2016-07-29 – 2016-08-02 (×12): 90 mg via ORAL
  Filled 2016-07-29 (×12): qty 1

## 2016-07-29 MED ORDER — LEVALBUTEROL HCL 1.25 MG/0.5ML IN NEBU
1.2500 mg | INHALATION_SOLUTION | Freq: Three times a day (TID) | RESPIRATORY_TRACT | Status: DC
  Administered 2016-07-30: 1.25 mg via RESPIRATORY_TRACT
  Filled 2016-07-29: qty 0.5

## 2016-07-29 MED ORDER — LORAZEPAM 0.5 MG PO TABS
0.5000 mg | ORAL_TABLET | Freq: Two times a day (BID) | ORAL | Status: DC | PRN
  Administered 2016-07-29 – 2016-07-31 (×3): 0.5 mg via ORAL
  Filled 2016-07-29 (×3): qty 1

## 2016-07-29 NOTE — Progress Notes (Signed)
PROGRESS NOTE    [name]  [id number] DOB: 30-Jan-1941 DOA: 07/27/2016 PCP: Cathlean Cower, MD   Brief Narrative:  75 y.o. 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 Associated with intermittent bouts of a rapid heartbeat and palpitations. Patient states she's been "fighting a COPD exacerbation. "Cough is nonproductive but getting worse, more frequent. Patient was placed on a prednisone taper over the last 24 hours without any relief. Patient states that symptoms became acutely worse this morning and was unable to ambulate due to the severe shortness of breath and chest tightness. EMS was called by patient's friend who evaluated patient and stated that heart rate was in the 140s. Diltiazem 20 mg given with improvement of heart rate into the 60s. Breathing treatments given with improvement in respiratory status. Denies fevers, radiation of chest tightness to neck or arm, headache, and neck stiffness, dysuria, frequency, abdominal pain, diarrhea, medication, bright red blood per rectum, melena, hematemesis. On Coumadin for coagulopathy   Subjective: 8/25  A/O 4, did not know she had CHF. states had COPD which she attended Pulmonary Rehabilitation for which upon completion she was able to come off of home O2. States usually negative expiratory wheezing. Patient husband state that she was actually placed on Coumadin 2001 for TIA does not recall having a hypercoagulability panel performed prior to starting medication. Would like to investigate using NOAC instead of Coumadin. Sees Dr. Harrington Challenger cardiology as her regular cardiologist.   Assessment & Plan:   Principal Problem:   Acute respiratory failure ([place]) Active Problems:   Gold B Copd with acute bronchitis   Long term current use of Coumadin   Atrial fibrillation with  rapid ventricular response (HCC)   Combined congestive systolic and diastolic heart failure ([place])   HLD (hyperlipidemia)   Essential hypertension   GERD (gastroesophageal reflux disease)   Insomnia   Depression with anxiety   New onset atrial fibrillation (HCC)   Respiratory distress   Chronic pain syndrome   Acute respiratory failure with hypoxia:  -Likely multifactorial including CHF exacerbation and COPD exacerbation. Patient with 5 pound weight gain over the last week with no home diuretic.  - congestion without consolidation suggestive of pneumonia. Last Echo showing EF of [id number] grade 2 diastolic dysfunction.  - Xopenex QID - Solumedrol 60 every 12  - Mucinex - Doxycycline: Complete 5 day course - Titrate O2 to maintain SPO2 89-93%  -Singular 10 mg daily -Dulera 200-5 micrograms BID  Atrial fibrillation with RVR:(New diagnoses:CHA2DS2-VASc = 8.)  -By patient's description of symptoms most likely in A. fib for some time -On chronic anticoagulation for clotting disorder?.  -Converted to Diltiazem PO 90 mg  TID. Her cardiology - Cardiology plans on performing DC CV prior to discharge  Systolic and Diastolic CHF -Lasix 20 mg BID - Avapro 150 mg daily -Strict in and out since admission -3.65ml -Daily weight Filed Weights   07/27/16 1029 07/28/16 0500 07/29/16 0400  Weight: 67.1 kg (148 lb) 67.8 kg (149 lb 7.6 oz) 66 kg (145 lb 9.6 oz)   HLD: - continue Zetia  HTN: - See CHF  Chronic pain: - continue flexeril 5 mg TID  GERD/esophageal stricture/hiatal hernia: - continue PPI  Depression/Anxiety: - continue celexa 20 mg daily - Add ativan PRN as pt very anxious  Insomnia: - continue melatonin   DVT prophylaxis: Coumadin for dose Code Status: Full  Family Communication: Husband is at bedside and answered all questions concerning patient's current condition and possibility of DC CV prior to discharge Disposition Plan: Cardiology   Consultants:  DR [facility]   Procedures/Significant Events:  7/21 Echocardiogram; - LVEF= 45%-50%. Diffuse hypokinesis. - (grade 2 diastolic dysfunction). - Ventricular septum: Septal motion showed paradox. C/W intraventricular conduction delay. - Mitral valve: moderate regurgitation - Left atrium: moderately to severely dilated.    Cultures 8/23 MRSA by PCR negative   Antimicrobials: Doxycycline 8/23>>   Devices None   LINES / TUBES:  None    Continuous Infusions: . diltiazem (CARDIZEM) infusion Stopped (07/29/16 1030)     Objective: Vitals:   07/29/16 0908 07/29/16 1133 07/29/16 1459 07/29/16 1702  BP:  (!) 129/92  138/76  Pulse:      Resp:  (!) 22  (!) 22  Temp:  98 F (36.7 C)  97.8 F (36.6 C)  TempSrc:  Oral  Oral  SpO2: 97% 97% 98% 97%  Weight:      Height:        Intake/Output Summary (Last 24 hours) at 07/29/16 1929 Last data filed at 07/29/16 1700  Gross per 24 hour  Intake             1050 ml  Output             3200 ml  Net            -2150 ml   Filed Weights   07/27/16 1029 07/28/16 0500 07/29/16 0400  Weight: 67.1 kg (148 lb) 67.8 kg (149 lb 7.6 oz) 66 kg (145 lb 9.6 oz)    Examination:  General: A/O 4, NAD, positive cute respiratory distress (requiring O2 to maintain adequate SPO2) Eyes: negative scleral hemorrhage, negative anisocoria, negative icterus ENT: Negative Runny nose, negative gingival bleeding, Neck:  Negative scars, masses, torticollis, lymphadenopathy, JVD Lungs: diffuse expiratory wheezing with poor air movement, negative crackles  Cardiovascular: Irregular irregular rhythm and rate, negative murmur gallop or rub normal S1 and S2 Abdomen: negative abdominal pain, nondistended, positive soft, bowel sounds, no rebound, no ascites, no appreciable mass Extremities: No significant cyanosis, clubbing. Positive bilateral pedal edema +1 to knees. Skin: Negative rashes, lesions, ulcers Psychiatric:  Negative depression, negative  anxiety, negative fatigue, negative mania  Central nervous system:  Cranial nerves II through XII intact, tongue/uvula midline, all extremities muscle strength 5/5, sensation intact throughout,  negative dysarthria, negative expressive aphasia, negative receptive aphasia.  .     Data Reviewed: Care during the described time interval was provided by me .  I have reviewed this patient's available data, including medical history, events of note, physical examination, and all test results as part of my evaluation. I have personally reviewed and interpreted all radiology studies.  CBC:  Recent Labs Lab 07/27/16 0948 07/27/16 1013 07/28/16 0421  WBC 11.9*  --  5.6  HGB 12.1 12.9 11.0*  HCT 38.1 38.0 35.4*  MCV 81.6  --  81.6  PLT 240  --  [id number]   Basic Metabolic Panel:  Recent Labs Lab 07/27/16 0952 07/27/16 1013 07/27/16 1148 07/28/16 0421  NA 139 140  --  139  K 4.1 4.1  --  4.0  CL 109 106  --  108  CO2 22  --   --  23  GLUCOSE 130* 126*  --  144*  BUN 40* 40*  --  28*  CREATININE 0.98 0.90  --  0.98  CALCIUM 9.1  --   --  8.9  MG 2.1  --  2.8*  --   PHOS  --   --  2.7  --    GFR: Estimated Creatinine Clearance: 45.6 mL/min (by C-G formula based on SCr of 0.98 mg/dL). Liver Function Tests:  Recent Labs Lab 07/27/16 0952 07/28/16 0421  AST 40 25  ALT 42 33  ALKPHOS 72 63  BILITOT 0.6 0.5  PROT 6.0* 5.7*  ALBUMIN 3.8 3.5   No results for input(s): LIPASE, AMYLASE in the last 168 hours. No results for input(s): AMMONIA in the last 168 hours. Coagulation Profile:  Recent Labs Lab 07/27/16 0952 07/28/16 0421 07/29/16 0746  INR 2.90 3.60 4.55*   Cardiac Enzymes:  Recent Labs Lab 07/27/16 1148 07/27/16 1441 07/27/16 1741  TROPONINI <0.03 <0.03 <0.03   BNP (last 3 results) No results for input(s): PROBNP in the last 8760 hours. HbA1C: No results for input(s): HGBA1C in the last 72 hours. CBG: No results for input(s): GLUCAP in the last 168  hours. Lipid Profile: No results for input(s): CHOL, HDL, LDLCALC, TRIG, CHOLHDL, LDLDIRECT in the last 72 hours. Thyroid Function Tests:  Recent Labs  07/27/16 1148  TSH 0.568  FREET4 0.98   Anemia Panel: No results for input(s): VITAMINB12, FOLATE, FERRITIN, TIBC, IRON, RETICCTPCT in the last 72 hours. Urine analysis:    Component Value Date/Time   COLORURINE YELLOW 07/08/2015 1015   APPEARANCEUR CLEAR 07/08/2015 1015   LABSPEC 1.010 07/08/2015 1015   PHURINE 7.0 07/08/2015 1015   GLUCOSEU NEGATIVE 07/08/2015 1015   GLUCOSEU NEGATIVE 12/02/2014 1528   HGBUR NEGATIVE 07/08/2015 1015   BILIRUBINUR NEGATIVE 07/08/2015 1015   KETONESUR NEGATIVE 07/08/2015 1015   PROTEINUR NEGATIVE 07/08/2015 1015   UROBILINOGEN 0.2 07/08/2015 1015   NITRITE NEGATIVE 07/08/2015 1015   LEUKOCYTESUR NEGATIVE 07/08/2015 1015   Sepsis Labs: @LABRCNTIP (procalcitonin:4,lacticidven:4)  ) Recent Results (from the past 240 hour(s))  MRSA PCR Screening     Status: None   Collection Time: 07/27/16  6:28 PM  Result Value Ref Range Status   MRSA by PCR NEGATIVE NEGATIVE Final    Comment:        The GeneXpert MRSA Assay (FDA approved for NASAL specimens only), is one component of a comprehensive MRSA colonization surveillance program. It is not intended to diagnose MRSA infection nor to guide or monitor treatment for MRSA infections.          Radiology Studies: Dg Chest 2 View  Result Date: 07/28/2016 CLINICAL DATA:  Congestive heart failure. Shortness of breath for 3 weeks EXAM: CHEST  2 VIEW COMPARISON:  July 27, 2016 FINDINGS: There remains generalized interstitial prominence without airspace consolidation. There are bilateral pleural effusions currently with cardiomegaly and pulmonary venous hypertension. No adenopathy. There is atherosclerotic calcification in the aorta. There is postoperative change in the upper lumbar spine. Bones appear osteoporotic. IMPRESSION: Evidence of a  degree of congestive heart failure superimposed on interstitial fibrosis. No airspace consolidation. There is aortic atherosclerosis. Bones appear osteoporotic. Electronically Signed   By: Lowella Grip III M.D.   On: 07/28/2016 07:17        Scheduled Meds: . citalopram  20 mg Oral Daily  . [START ON 07/30/2016] digoxin  0.125 mg Oral Daily  . diltiazem  90 mg Oral Q8H  . doxycycline (VIBRAMYCIN) IV  100 mg Intravenous Q12H  . ezetimibe  10 mg Oral Daily  . furosemide  20 mg Intravenous BID  . guaiFENesin  1,200 mg Oral BID  . irbesartan  150  mg Oral q morning - 10a  . levalbuterol  1.25 mg Nebulization Q6H  . methylPREDNISolone (SOLU-MEDROL) injection  60 mg Intravenous Q12H  . mirabegron ER  50 mg Oral Daily  . mometasone-formoterol  2 puff Inhalation BID  . montelukast  10 mg Oral Daily  . pantoprazole  80 mg Oral Daily  . sodium chloride flush  3 mL Intravenous Q12H  . sodium chloride flush  3 mL Intravenous Q12H  . tiotropium  1 capsule Inhalation Daily  . Warfarin - Pharmacist Dosing Inpatient   Does not apply q1800   Continuous Infusions: . diltiazem (CARDIZEM) infusion Stopped (07/29/16 1030)     LOS: 2 days    Time spent: 40 minutes    WOODS, Geraldo Docker, MD [facility] Pager [phone]   If 7PM-7AM, please contact night-coverage [email or web address] Password [facility] 07/29/2016, 7:29 PM

## 2016-07-29 NOTE — Progress Notes (Signed)
ANTICOAGULATION CONSULT NOTE - Initial Consult  Pharmacy Consult for warfarin Indication: atrial fibrillation and stroke  Allergies  Allergen Reactions  . Clarithromycin Other (See Comments)    REACTION: possible rash but could have been legionarres dz with rash  . Lipitor [Atorvastatin] Other (See Comments)    MUSCLE SPASMS  . Simvastatin Other (See Comments)    Muscle and joint pain  . Crestor [Rosuvastatin Calcium]     fagitue and joint pain, "NO STATINS"    Patient Measurements: Height: 5\' 4"  (162.6 cm) Weight: 145 lb 9.6 oz (66 kg) IBW/kg (Calculated) : 54.7  Vital Signs: Temp: 98.7 F (37.1 C) (08/25 0831) Temp Source: Oral (08/25 0831) BP: 140/70 (08/25 0831)  Labs:  Recent Labs  07/27/16 0948 07/27/16 0952 07/27/16 1013 07/27/16 1148 07/27/16 1441 07/27/16 1741 07/28/16 0421 07/29/16 0746  HGB 12.1  --  12.9  --   --   --  11.0*  --   HCT 38.1  --  38.0  --   --   --  35.4*  --   PLT 240  --   --   --   --   --  210  --   LABPROT  --  31.0*  --   --   --   --  36.8* 44.4*  INR  --  2.90  --   --   --   --  3.60 4.55*  CREATININE  --  0.98 0.90  --   --   --  0.98  --   TROPONINI  --   --   --  <0.03 <0.03 <0.03  --   --     Estimated Creatinine Clearance: 45.6 mL/min (by C-G formula based on SCr of 0.98 mg/dL).   Medications:  Warfarin PTA 5mg  TTSS, 7.5mg  MWF  Assessment: 49 yof presented to the ED with SOB. She is on chronic coumadin for history of CVA, now with new onset afib. INR on admission was therapeutic at 2.9. INR continues to be supratherapeutic at 4.55. Warfarin held yesterday and will continue to hold today. INR likely elevated in setting of acute illness as well as potential DDI with doxycycline. Hgb downtrending slowly from 12.1 to 11 and platelets within normal limits. No bleeding noted.   Goal of Therapy:  INR 2-3 Monitor platelets by anticoagulation protocol: Yes   Plan:  - Hold warfarin tonight x1 - Daily INR - CBC in AM -  Monitor for s/s bleeding   Argie Ramming, PharmD Pharmacy Resident  Pager [phone] 07/29/16 10:28 AM

## 2016-07-29 NOTE — Progress Notes (Signed)
Patient Name: [name] Date of Encounter: 07/29/2016  PCP: Cathlean Cower, MD ATTENDING: Allie Bossier, MD CARDIOLOGIST: Lizbeth Bark, MD PULMONOLOGIST: Calton Dach, MD  Hospital Problem List     Principal Problem:   Acute respiratory failure [facility]) Active Problems:   New onset atrial fibrillation [facility])   Atrial fibrillation with rapid ventricular response ([place])   Gold B Copd with acute bronchitis   Long term current use of Coumadin   Combined congestive systolic and diastolic heart failure (HCC)   HLD (hyperlipidemia)   Essential hypertension   GERD (gastroesophageal reflux disease)   Insomnia   Depression with anxiety   Respiratory distress    Patient Summary     75 year old woman with a history of COPD, history of stroke on chronic Coumadin, and frequent PVCs noted on recent EKG who now presented to [facility] on 8/20 02/22/2016 with restoration failure. Accommodation of COPD exacerbation along with CHF. This was calculated by A. fib RVR with rates 180 BPM range. Initially rate was reduced with IV diltiazem and then started on diltiazem drip. Has been on IV Lasix and steroids. This is a new diagnosis of A. fib.  Subjective   She states that she is improving slowly, but definitely feels better today.  Does note that she has had significant urine output.  Inpatient Medications    . citalopram  20 mg Oral Daily  . doxycycline (VIBRAMYCIN) IV  100 mg Intravenous Q12H  . ezetimibe  10 mg Oral Daily  . furosemide  20 mg Intravenous BID  . guaiFENesin  1,200 mg Oral BID  . irbesartan  150 mg Oral q morning - 10a  . levalbuterol  1.25 mg Nebulization Q6H  . methylPREDNISolone (SOLU-MEDROL) injection  60 mg Intravenous Q12H  . mirabegron ER  50 mg Oral Daily  . mometasone-formoterol  2 puff Inhalation BID  . montelukast  10 mg Oral Daily  . pantoprazole  80 mg Oral Daily  . sodium chloride flush  3 mL Intravenous Q12H  . sodium chloride flush  3 mL Intravenous Q12H  .  tiotropium  1 capsule Inhalation Daily  . Warfarin - Pharmacist Dosing Inpatient   Does not apply q1800    Vital Signs    Vitals:   07/29/16 0054 07/29/16 0400 07/29/16 0425 07/29/16 0831  BP: 116/65  123/81 140/70  Pulse:      Resp: 15 17 17  (!) 22  Temp:  97.9 F (36.6 C)  98.7 F (37.1 C)  TempSrc:  Oral  Oral  SpO2: 95% 94% 95% 96%  Weight:  145 lb 9.6 oz (66 kg)    Height:        Intake/Output Summary (Last 24 hours) at 07/29/16 0850 Last data filed at 07/29/16 0700  Gross per 24 hour  Intake          1416.83 ml  Output             4200 ml  Net         -2783.17 ml   Filed Weights   07/27/16 1029 07/28/16 0500 07/29/16 0400  Weight: 148 lb (67.1 kg) 149 lb 7.6 oz (67.8 kg) 145 lb 9.6 oz (66 kg)    Physical Exam    GEN: Notably improved level of respiratory distress. Much less work of breathing. HEENT: normal.  Neck: Supple, Mild JVD roughly 8-10 cmH20, carotid bruits, or masses. Cardiac: Irregularly irregular rhythm, no murmurs, rubs, or gallops. No clubbing, cyanosis, edema.  Radials/DP/PT  2+ and equal bilaterally.  Respiratory: Less labored. Still has pursed lip breathing. Notably improved air movement compared to yesterday still has some mild diffuse rhonchi and late expiratory wheezing, but notably improved. GI: Soft, nontender, nondistended, BS + x 4. Skin: warm and dry, no rash. Neuro:  Strength and sensation are intact. Psych: Normal affect. Seems better spirits today  Labs    CBC  Recent Labs  07/27/16 0948 07/27/16 1013 07/28/16 0421  WBC 11.9*  --  5.6  HGB 12.1 12.9 11.0*  HCT 38.1 38.0 35.4*  MCV 81.6  --  81.6  PLT 240  --  [id number]   Basic Metabolic Panel  Recent Labs  07/27/16 0952 07/27/16 1013 07/27/16 1148 07/28/16 0421  NA 139 140  --  139  K 4.1 4.1  --  4.0  CL 109 106  --  108  CO2 22  --   --  23  GLUCOSE 130* 126*  --  144*  BUN 40* 40*  --  28*  CREATININE 0.98 0.90  --  0.98  CALCIUM 9.1  --   --  8.9  MG 2.1  --   2.8*  --   PHOS  --   --  2.7  --    Liver Function Tests  Recent Labs  07/27/16 0952 07/28/16 0421  AST 40 25  ALT 42 33  ALKPHOS 72 63  BILITOT 0.6 0.5  PROT 6.0* 5.7*  ALBUMIN 3.8 3.5   No results for input(s): LIPASE, AMYLASE in the last 72 hours. Cardiac Enzymes  Recent Labs  07/27/16 1148 07/27/16 1441 07/27/16 1741  TROPONINI <0.03 <0.03 <0.03   BNP Invalid input(s): POCBNP D-Dimer No results for input(s): DDIMER in the last 72 hours. Hemoglobin A1C No results for input(s): HGBA1C in the last 72 hours. Fasting Lipid Panel No results for input(s): CHOL, HDL, LDLCALC, TRIG, CHOLHDL, LDLDIRECT in the last 72 hours. Thyroid Function Tests  Recent Labs  07/27/16 1148  TSH 0.568    Telemetry    Currently A. fib with rates of 80s to 90s with PVCs and aberrantly conducted beats  ECG    A. fib with PVCs and aberrantly conducted beats rate 79; native beats have IVCD pattern  Radiology    No new studies  Assessment & Plan    Principal Problem:   Acute respiratory failure ([place]) - combined COPD and CHF complicated by A. fib RVR.  Notably improved. Much less tachypnea. Still wheezing.  Continue COPD and CHF treatment along with rate control for A. fib. Active Problems:   New onset atrial fibrillation (HCC) -- Long term current use of CoumadinFor chronic PTE   Atrial fibrillation with rapid ventricular response ([place])  Rates have improved, still remains in A. fib mostly in the 80-90s on my exam. PVCs As well as aberrantly conducted complexes also present.  At this point I think were okay converting from IV to oral diltiazem. We'll start with 90 mg every 8 hours  She responded well to the IV digoxin, could use this if she has bursts of tachycardia. -- We'll give 1 additional dose of IV digoxin, and start low-dose by mouth digoxin tomorrow. With reduced ejection fraction is well-preserved with inotropic effect.  As her pulmonary function improves, we could  consider cardioversion prior to discharge she is on therapeutic dose of warfarin.    Gold B Copd with acute bronchitis - per primary team; on Solu-Medrol and antibiotics.      Combined congestive systolic  and diastolic heart failure (HCC) -- echo from last month showed EF 45-50% with diffuse hypokinesis and grade 2 diastolic dysfunction.   Ccontinue IV Lasix, would DC normal saline infusion in order to allow for improved net output  Continue ARB for now as long as blood pressure tolerates.    Essential hypertension - maintaining stable blood pressure. Tolerating diltiazem infusion - will use a slightly higher dose of oral diltiazem and she is getting a 24-hour dosage of IV. - Total dose would be 270 mg using 90mg  tid, convert to long-acting would probably use 300 mg    Signed, Glenetta Hew, M.D., M.S. Interventional Cardiologist   Pager # [phone] Phone # [phone] [address] [place],  [address]

## 2016-07-30 DIAGNOSIS — K219 Gastro-esophageal reflux disease without esophagitis: Secondary | ICD-10-CM

## 2016-07-30 DIAGNOSIS — F418 Other specified anxiety disorders: Secondary | ICD-10-CM

## 2016-07-30 LAB — BASIC METABOLIC PANEL
Anion gap: 8 (ref 5–15)
BUN: 36 mg/dL — ABNORMAL HIGH (ref 6–20)
CO2: 30 mmol/L (ref 22–32)
Calcium: 9.5 mg/dL (ref 8.9–10.3)
Chloride: 102 mmol/L (ref 101–111)
Creatinine, Ser: 1.29 mg/dL — ABNORMAL HIGH (ref 0.44–1.00)
GFR calc Af Amer: 45 mL/min — ABNORMAL LOW (ref >60)
GFR calc non Af Amer: 39 mL/min — ABNORMAL LOW (ref >60)
Glucose, Bld: 141 mg/dL — ABNORMAL HIGH (ref 65–99)
Potassium: 3.6 mmol/L (ref 3.5–5.1)
Sodium: 140 mmol/L (ref 135–145)

## 2016-07-30 LAB — CBC
HCT: 38.3 % (ref 36.0–46.0)
Hemoglobin: 12.1 g/dL (ref 12.0–15.0)
MCH: 25.7 pg — ABNORMAL LOW (ref 26.0–34.0)
MCHC: 31.6 g/dL (ref 30.0–36.0)
MCV: 81.3 fL (ref 78.0–100.0)
Platelets: 224 10*3/uL (ref 150–400)
RBC: 4.71 MIL/uL (ref 3.87–5.11)
RDW: 15.4 % (ref 11.5–15.5)
WBC: 6.6 10*3/uL (ref 4.0–10.5)

## 2016-07-30 LAB — PROTIME-INR
INR: 3.93
Prothrombin Time: 39.5 seconds — ABNORMAL HIGH (ref 11.4–15.2)

## 2016-07-30 MED ORDER — LEVALBUTEROL HCL 1.25 MG/0.5ML IN NEBU: 1.2500 mg | INHALATION_SOLUTION | Freq: Four times a day (QID) | RESPIRATORY_TRACT | Status: DC | PRN

## 2016-07-30 MED ORDER — FUROSEMIDE 20 MG PO TABS: 20.0000 mg | ORAL_TABLET | Freq: Every day | ORAL | Status: DC

## 2016-07-30 MED ORDER — METHYLPREDNISOLONE SODIUM SUCC 125 MG IJ SOLR
60.0000 mg | Freq: Every day | INTRAMUSCULAR | Status: DC
Start: 2016-07-31 — End: 2016-08-01
  Administered 2016-07-31: 60 mg via INTRAVENOUS
  Filled 2016-07-30: qty 2

## 2016-07-30 NOTE — Progress Notes (Signed)
PROGRESS NOTE    [name]  [id number] DOB: 06-30-1941 DOA: 07/27/2016 PCP: Cathlean Cower, MD    Brief Narrative:  75 y.o.WF PMHx clotting disorder on chronic anticoagulation/Coumadin, COPD, esophageal stricture, GERD, Hiatal Hernia, CVA, HLD, HTN, Nephrolithiasis,   Presenting with several week history of progressive shortness of breath. Associated with intermittent bouts of a rapid heartbeat and palpitations. Patient states she's been "fighting a COPD exacerbation. "Cough is nonproductive but getting worse, more frequent. Patient was placed on a prednisone taper over the last 24 hours without any relief. Patient states that symptoms became acutely worse this morning and was unable to ambulate due to the severe shortness of breath and chest tightness. EMS was called by patient's friend who evaluated patient and stated that heart rate was in the 140s. Diltiazem 20 mg given with improvement of heart rate into the 60s. Breathing treatments given with improvement in respiratory status. Denies fevers, radiation of chest tightness to neck or arm, headache, and neck stiffness, dysuria, frequency, abdominal pain, diarrhea, medication, bright red blood per rectum, melena, hematemesis. On Coumadin for coagulopathy   Assessment & Plan:   Principal Problem:   Acute respiratory failure ([place]) Active Problems:   Gold B Copd with acute bronchitis   Long term current use of Coumadin   Atrial fibrillation with rapid ventricular response (HCC)   Combined congestive systolic and diastolic heart failure (HCC)   HLD (hyperlipidemia)   Essential hypertension   GERD (gastroesophageal reflux disease)   Insomnia   Depression with anxiety   New onset atrial fibrillation (HCC)   Respiratory distress   Chronic pain syndrome  Acute respiratory failure with hypoxia:  -Likely multifactorial including CHF exacerbation and COPD exacerbation. Patient with 5 pound weight gain over the last week with no home  diuretic.  - congestion without consolidation suggestive of pneumonia. Last Echo showing EF of [id number] grade 2 diastolic dysfunction.  - Xopenex QID - Decrease Solumedrol60mg  daily - Mucinex - Doxycycline: Complete 5 day course - Titrate O2 to maintain SPO2 89-93%  -Singular 10 mg daily -Dulera 200-5 micrograms BID  Atrial fibrillation with RVR:(New diagnoses:CHA2DS2-VASc = 8.)  -By patient's description of symptoms most likely in A. fib for some time -On chronic anticoagulation for clotting disorder?.  -Converted to Diltiazem PO 90 mg  TID for rate control.  - Cardiology plans on performing DCCV prior to discharge -Per cardiology.  Systolic and Diastolic CHF Clinical improvement. Current weight at 64.5 kg today from 66 kg on 07/29/2016 from 67.8 kg 07/28/2016 from 67.1 kg 07/27/2016. Patient is -1.84 L over the past 24 hours and -4.847 L over this hospitalization. Patient with a bump in her creatinine and a such Lasix dose has been changed to 20 mg daily per cardiology. Continue digoxin, diltiazem, Avapro.Strict I/Os, daily weights. Per cardiology.      Filed Weights   07/27/16 1029 07/28/16 0500 07/29/16 0400  Weight: 67.1 kg (148 lb) 67.8 kg (149 lb 7.6 oz) 66 kg (145 lb 9.6 oz)   HLD: - continue Zetia  HTN: - See CHF  Chronic pain: - continue flexeril 5 mg TID  GERD/esophageal stricture/hiatal hernia: - continue PPI  Depression/Anxiety: - continue celexa 20 mg daily - Add ativan PRN as pt very anxious  Insomnia: - continue melatonin     DVT prophylaxis: Coumadin. INR currently supratherapeutic at 3.93. Code Status: Full Family Communication: Updated patient and husband at bedside. Disposition Plan: Home when medically stable and post cardioversion and per cardiology.   Consultants:  Cardiology: Dr. Ellyn Hack 07/27/2016  Procedures:   Chest x-ray 07/27/2016, 07/28/2016  Antimicrobials:   Doxycycline 07/27/2016   Subjective: Patient states  shortness of breath improved. Patient denies any chest pain. Patient denies any wheezing.  Objective: Vitals:   07/29/16 1702 07/29/16 2000 07/29/16 2112 07/30/16 0550  BP: 138/76  (!) 142/68 135/73  Pulse:  85 92 65  Resp: (!) 22  20 18   Temp: 97.8 F (36.6 C)  98 F (36.7 C) 98.6 F (37 C)  TempSrc: Oral  Oral Oral  SpO2: 97%  97% 98%  Weight:   65.5 kg (144 lb 6.4 oz) 64.5 kg (142 lb 4.8 oz)  Height:   5\' 4"  (1.626 m)     Intake/Output Summary (Last 24 hours) at 07/30/16 1154 Last data filed at 07/29/16 1943  Gross per 24 hour  Intake              490 ml  Output             2400 ml  Net            -1910 ml   Filed Weights   07/29/16 0400 07/29/16 2112 07/30/16 0550  Weight: 66 kg (145 lb 9.6 oz) 65.5 kg (144 lb 6.4 oz) 64.5 kg (142 lb 4.8 oz)    Examination:  General exam: Appears calm and comfortable  Respiratory system: Clear to auscultation. Respiratory effort normal. Cardiovascular system: S1 & S2 heard, Irregularly irregular. No JVD, murmurs, rubs, gallops or clicks. No pedal edema. Gastrointestinal system: Abdomen is nondistended, soft and nontender. No organomegaly or masses felt. Normal bowel sounds heard. Central nervous system: Alert and oriented. No focal neurological deficits. Extremities: Symmetric 5 x 5 power. Skin: No rashes, lesions or ulcers Psychiatry: Judgement and insight appear normal. Mood & affect appropriate.     Data Reviewed: I have personally reviewed following labs and imaging studies  CBC:  Recent Labs Lab 07/27/16 0948 07/27/16 1013 07/28/16 0421 07/30/16 0303  WBC 11.9*  --  5.6 6.6  HGB 12.1 12.9 11.0* 12.1  HCT 38.1 38.0 35.4* 38.3  MCV 81.6  --  81.6 81.3  PLT 240  --  210 [id number]   Basic Metabolic Panel:  Recent Labs Lab 07/27/16 0952 07/27/16 1013 07/27/16 1148 07/28/16 0421 07/30/16 0303  NA 139 140  --  139 140  K 4.1 4.1  --  4.0 3.6  CL 109 106  --  108 102  CO2 22  --   --  23 30  GLUCOSE 130* 126*  --   144* 141*  BUN 40* 40*  --  28* 36*  CREATININE 0.98 0.90  --  0.98 1.29*  CALCIUM 9.1  --   --  8.9 9.5  MG 2.1  --  2.8*  --   --   PHOS  --   --  2.7  --   --    GFR: Estimated Creatinine Clearance: 32 mL/min (by C-G formula based on SCr of 1.29 mg/dL). Liver Function Tests:  Recent Labs Lab 07/27/16 0952 07/28/16 0421  AST 40 25  ALT 42 33  ALKPHOS 72 63  BILITOT 0.6 0.5  PROT 6.0* 5.7*  ALBUMIN 3.8 3.5   No results for input(s): LIPASE, AMYLASE in the last 168 hours. No results for input(s): AMMONIA in the last 168 hours. Coagulation Profile:  Recent Labs Lab 07/27/16 0952 07/28/16 0421 07/29/16 0746 07/30/16 0303  INR 2.90 3.60 4.55* 3.93   Cardiac Enzymes:  Recent Labs Lab 07/27/16 1148 07/27/16 1441 07/27/16 1741  TROPONINI <0.03 <0.03 <0.03   BNP (last 3 results) No results for input(s): PROBNP in the last 8760 hours. HbA1C: No results for input(s): HGBA1C in the last 72 hours. CBG: No results for input(s): GLUCAP in the last 168 hours. Lipid Profile: No results for input(s): CHOL, HDL, LDLCALC, TRIG, CHOLHDL, LDLDIRECT in the last 72 hours. Thyroid Function Tests: No results for input(s): TSH, T4TOTAL, FREET4, T3FREE, THYROIDAB in the last 72 hours. Anemia Panel: No results for input(s): VITAMINB12, FOLATE, FERRITIN, TIBC, IRON, RETICCTPCT in the last 72 hours. Sepsis Labs:  Recent Labs Lab 07/27/16 1013 07/27/16 1148 07/27/16 1449 07/29/16 0746  PROCALCITON  --  <0.10  --  <0.10  LATICACIDVEN 1.62  --  2.59*  --     Recent Results (from the past 240 hour(s))  MRSA PCR Screening     Status: None   Collection Time: 07/27/16  6:28 PM  Result Value Ref Range Status   MRSA by PCR NEGATIVE NEGATIVE Final    Comment:        The GeneXpert MRSA Assay (FDA approved for NASAL specimens only), is one component of a comprehensive MRSA colonization surveillance program. It is not intended to diagnose MRSA infection nor to guide or monitor  treatment for MRSA infections.          Radiology Studies: No results found.      Scheduled Meds: . citalopram  20 mg Oral Daily  . digoxin  0.125 mg Oral Daily  . diltiazem  90 mg Oral Q8H  . doxycycline (VIBRAMYCIN) IV  100 mg Intravenous Q12H  . ezetimibe  10 mg Oral Daily  . furosemide  20 mg Oral Daily  . guaiFENesin  1,200 mg Oral BID  . irbesartan  150 mg Oral q morning - 10a  . levalbuterol  1.25 mg Nebulization TID  . methylPREDNISolone (SOLU-MEDROL) injection  60 mg Intravenous Q12H  . mirabegron ER  50 mg Oral Daily  . mometasone-formoterol  2 puff Inhalation BID  . montelukast  10 mg Oral Daily  . pantoprazole  80 mg Oral Daily  . sodium chloride flush  3 mL Intravenous Q12H  . sodium chloride flush  3 mL Intravenous Q12H  . tiotropium  1 capsule Inhalation Daily  . Warfarin - Pharmacist Dosing Inpatient   Does not apply q1800   Continuous Infusions: . diltiazem (CARDIZEM) infusion Stopped (07/29/16 1030)     LOS: 3 days    Time spent: 71 minutes    THOMPSON,DANIEL, MD [facility] Pager [phone]  If 7PM-7AM, please contact night-coverage [email or web address] Password [facility] 07/30/2016, 11:54 AM

## 2016-07-30 NOTE — Progress Notes (Signed)
ANTICOAGULATION CONSULT NOTE - Follow Up Consult  Pharmacy Consult for Coumadin Indication: atrial fibrillation and stroke  Allergies  Allergen Reactions  . Clarithromycin Other (See Comments)    REACTION: possible rash but could have been legionarres dz with rash  . Lipitor [Atorvastatin] Other (See Comments)    MUSCLE SPASMS  . Simvastatin Other (See Comments)    Muscle and joint pain  . Crestor [Rosuvastatin Calcium]     fagitue and joint pain, "NO STATINS"    Patient Measurements: Height: 5\' 4"  (162.6 cm) Weight: 142 lb 4.8 oz (64.5 kg) IBW/kg (Calculated) : 54.7  Vital Signs: Temp: 98.6 F (37 C) (08/26 0550) Temp Source: Oral (08/26 0550) BP: 135/73 (08/26 0550) Pulse Rate: 65 (08/26 0550)  Labs:  Recent Labs  07/27/16 1148 07/27/16 1441 07/27/16 1741 07/28/16 0421 07/29/16 0746 07/30/16 0303  HGB  --   --   --  11.0*  --  12.1  HCT  --   --   --  35.4*  --  38.3  PLT  --   --   --  210  --  224  LABPROT  --   --   --  36.8* 44.4* 39.5*  INR  --   --   --  3.60 4.55* 3.93  CREATININE  --   --   --  0.98  --  1.29*  TROPONINI <0.03 <0.03 <0.03  --   --   --     Estimated Creatinine Clearance: 32 mL/min (by C-G formula based on SCr of 1.29 mg/dL).  Assessment: 87 yof on chronic coumadin for history of CVA, now with new onset afib. INR on admission was therapeutic at 2.9 and dose given. INR has since trended up 3.6 > 4.55 > 3.93 today, possibly due to interaction with doxycycline. CBC stable. No bleeding.  Goal of Therapy:  INR 2-3 Monitor platelets by anticoagulation protocol: Yes   Plan:  1) Continue to hold coumadin 2) Daily INR  Nena Jordan, PharmD, BCPS 07/30/16 11:24 AM

## 2016-07-30 NOTE — Progress Notes (Signed)
    SUBJECTIVE:  Breathing is better.     PHYSICAL EXAM Vitals:   07/29/16 1702 07/29/16 2000 07/29/16 2112 07/30/16 0550  BP: 138/76  (!) 142/68 135/73  Pulse:  85 92 65  Resp: (!) 22  20 18   Temp: 97.8 F (36.6 C)  98 F (36.7 C) 98.6 F (37 C)  TempSrc: Oral  Oral Oral  SpO2: 97%  97% 98%  Weight:   144 lb 6.4 oz (65.5 kg) 142 lb 4.8 oz (64.5 kg)  Height:   5\' 4"  (1.626 m)    General:  No acute distress Lungs:  Decreased breath sounds no wheezing Heart:  Irregular Abdomen:  Positive bowel sounds, no rebound no guarding Extremities:  No edema   LABS: Lab Results  Component Value Date   TROPONINI <0.03 07/27/2016   Results for orders placed or performed during the hospital encounter of 07/27/16 (from the past 24 hour(s))  Protime-INR     Status: Abnormal   Collection Time: 07/30/16  3:03 AM  Result Value Ref Range   Prothrombin Time 39.5 (H) 11.4 - 15.2 seconds   INR [id number]   Basic metabolic panel     Status: Abnormal   Collection Time: 07/30/16  3:03 AM  Result Value Ref Range   Sodium 140 135 - 145 mmol/L   Potassium 3.6 3.5 - 5.1 mmol/L   Chloride 102 101 - 111 mmol/L   CO2 30 22 - 32 mmol/L   Glucose, Bld 141 (H) 65 - 99 mg/dL   BUN 36 (H) 6 - 20 mg/dL   Creatinine, Ser 1.29 (H) 0.44 - 1.00 mg/dL   Calcium 9.5 8.9 - 10.3 mg/dL   GFR calc non Af Amer 39 (L) >60 mL/min   GFR calc Af Amer 45 (L) >60 mL/min   Anion gap 8 5 - 15  CBC     Status: Abnormal   Collection Time: 07/30/16  3:03 AM  Result Value Ref Range   WBC 6.6 4.0 - 10.5 K/uL   RBC 4.71 3.87 - 5.11 MIL/uL   Hemoglobin 12.1 12.0 - 15.0 g/dL   HCT 38.3 36.0 - 46.0 %   MCV 81.3 78.0 - 100.0 fL   MCH 25.7 (L) 26.0 - 34.0 pg   MCHC 31.6 30.0 - 36.0 g/dL   RDW 15.4 11.5 - 15.5 %   Platelets 224 150 - 400 K/uL    Intake/Output Summary (Last 24 hours) at 07/30/16 1120 Last data filed at 07/29/16 1943  Gross per 24 hour  Intake              490 ml  Output             2400 ml  Net             -1910 ml    ASSESSMENT AND PLAN:  ACUTE ON CHRONIC SYSTOLIC AND DIASTOLIC HF:  Creat is up today.  I have reduced the Lasix to once po daily.    ATRIAL FIB:  On oral Dilt.  Oral dig started today.   She is not scheduled for a cardioversion on Monday.  We will not find out until Monday AM whether we can get her on the schedule.  Continue with rate control and anticoagulation for now.   Jeneen Rinks [facility] 07/30/2016 11:20 AM

## 2016-07-31 LAB — BASIC METABOLIC PANEL
Anion gap: 9 (ref 5–15)
BUN: 36 mg/dL — ABNORMAL HIGH (ref 6–20)
CO2: 29 mmol/L (ref 22–32)
Calcium: 9.3 mg/dL (ref 8.9–10.3)
Chloride: 103 mmol/L (ref 101–111)
Creatinine, Ser: 0.92 mg/dL (ref 0.44–1.00)
GFR calc Af Amer: >60 mL/min (ref >60)
GFR calc non Af Amer: 59 mL/min — ABNORMAL LOW (ref >60)
Glucose, Bld: 135 mg/dL — ABNORMAL HIGH (ref 65–99)
Potassium: 3.5 mmol/L (ref 3.5–5.1)
Sodium: 141 mmol/L (ref 135–145)

## 2016-07-31 LAB — PROTIME-INR
INR: 3.29
Prothrombin Time: 34.2 seconds — ABNORMAL HIGH (ref 11.4–15.2)

## 2016-07-31 LAB — PROCALCITONIN: Procalcitonin: <0.1 ng/mL

## 2016-07-31 LAB — MAGNESIUM: Magnesium: 2.3 mg/dL (ref 1.7–2.4)

## 2016-07-31 MED ORDER — FUROSEMIDE 20 MG PO TABS
20.0000 mg | ORAL_TABLET | Freq: Every day | ORAL | Status: DC
  Administered 2016-07-31 – 2016-08-02 (×3): 20 mg via ORAL
  Filled 2016-07-31 (×3): qty 1

## 2016-07-31 MED ORDER — POTASSIUM CHLORIDE CRYS ER 20 MEQ PO TBCR
40.0000 meq | EXTENDED_RELEASE_TABLET | Freq: Once | ORAL | Status: AC
  Administered 2016-07-31: 40 meq via ORAL
  Filled 2016-07-31: qty 2

## 2016-07-31 MED ORDER — WARFARIN SODIUM 2.5 MG PO TABS
2.5000 mg | ORAL_TABLET | Freq: Once | ORAL | Status: AC
  Administered 2016-07-31: 2.5 mg via ORAL
  Filled 2016-07-31: qty 1

## 2016-07-31 NOTE — Progress Notes (Signed)
PROGRESS NOTE    [name]  [id number] DOB: 1941/10/30 DOA: 07/27/2016 PCP: Cathlean Cower, MD    Brief Narrative:  75 y.o.WF PMHx clotting disorder on chronic anticoagulation/Coumadin, COPD, esophageal stricture, GERD, Hiatal Hernia, CVA, HLD, HTN, Nephrolithiasis,   Presenting with several week history of progressive shortness of breath. Associated with intermittent bouts of a rapid heartbeat and palpitations. Patient states she's been "fighting a COPD exacerbation. "Cough is nonproductive but getting worse, more frequent. Patient was placed on a prednisone taper over the last 24 hours without any relief. Patient states that symptoms became acutely worse this morning and was unable to ambulate due to the severe shortness of breath and chest tightness. EMS was called by patient's friend who evaluated patient and stated that heart rate was in the 140s. Diltiazem 20 mg given with improvement of heart rate into the 60s. Breathing treatments given with improvement in respiratory status. Denies fevers, radiation of chest tightness to neck or arm, headache, and neck stiffness, dysuria, frequency, abdominal pain, diarrhea, medication, bright red blood per rectum, melena, hematemesis. On Coumadin for coagulopathy   Assessment & Plan:   Principal Problem:   Acute respiratory failure ([place]) Active Problems:   Gold B Copd with acute bronchitis   Long term current use of Coumadin   Atrial fibrillation with rapid ventricular response (HCC)   Combined congestive systolic and diastolic heart failure (HCC)   HLD (hyperlipidemia)   Essential hypertension   GERD (gastroesophageal reflux disease)   Insomnia   Depression with anxiety   New onset atrial fibrillation (HCC)   Respiratory distress   Chronic pain syndrome  Acute respiratory failure with hypoxia:  -Likely multifactorial including CHF exacerbation and COPD exacerbation. Patient with 5 pound weight gain over the last week with no home  diuretic.  - congestion without consolidation suggestive of pneumonia. Last Echo showing EF of [id number] grade 2 diastolic dysfunction.  - Xopenex QID - Continue Solumedrol60mg  daily and possibly transition to oral prednisone tomorrow. - Mucinex - Doxycycline: Complete 5 day course - Titrate O2 to maintain SPO2 89-93%  -Singular 10 mg daily -Dulera 200-5 micrograms BID  Atrial fibrillation with RVR:(New diagnoses:CHA2DS2-VASc = 8.)  -By patient's description of symptoms most likely in A. fib for some time -On chronic anticoagulation for clotting disorder?.  -Converted to Diltiazem PO 90 mg  TID for rate control.  - Cardiology plans on performing DCCV prior to discharge -Per cardiology.  Systolic and Diastolic CHF Clinical improvement. Current weight at 64.1 kg from 64.5 kg from 66 kg on 07/29/2016 from 67.8 kg 07/28/2016 from 67.1 kg 07/27/2016. Patient is -1.84 L over the past 24 hours and -4.847 L over this hospitalization. Patient with a bump in her creatinine yesterday which has trended back down with change in Lasix dose of 20 mg daily. Continue digoxin, diltiazem, Avapro.Strict I/Os, daily weights. Per cardiology.      Filed Weights   07/27/16 1029 07/28/16 0500 07/29/16 0400  Weight: 67.1 kg (148 lb) 67.8 kg (149 lb 7.6 oz) 66 kg (145 lb 9.6 oz)   HLD: - continue Zetia  HTN: - See CHF  Chronic pain: - continue flexeril 5 mg TID  GERD/esophageal stricture/hiatal hernia: - continue PPI  Depression/Anxiety: - continue celexa 20 mg daily - Add ativan PRN as pt very anxious  Insomnia: - continue melatonin     DVT prophylaxis: Coumadin. INR currently supratherapeutic at 3.29. Code Status: Full Family Communication: Updated patient.No family at bedside. Disposition Plan: Home when medically stable  and post cardioversion and per cardiology.   Consultants:   Cardiology: Dr. Ellyn Hack 07/27/2016  Procedures:   Chest x-ray 07/27/2016,  07/28/2016  Antimicrobials:   Doxycycline 07/27/2016   Subjective: Patient states shortness of breath improved. Patient denies any chest pain. Patient states she's feeling better.  Objective: Vitals:   07/30/16 1347 07/30/16 2017 07/30/16 2157 07/31/16 0510  BP: 131/87 112/76 134/80 132/74  Pulse: 75 85 74 81  Resp: 17 20  18   Temp: 98.4 F (36.9 C) 97.9 F (36.6 C)  98 F (36.7 C)  TempSrc: Oral Oral  Oral  SpO2: 94% 95%  96%  Weight:    64.1 kg (141 lb 6.4 oz)  Height:        Intake/Output Summary (Last 24 hours) at 07/31/16 1147 Last data filed at 07/30/16 1347  Gross per 24 hour  Intake              120 ml  Output                0 ml  Net              120 ml   Filed Weights   07/29/16 2112 07/30/16 0550 07/31/16 0510  Weight: 65.5 kg (144 lb 6.4 oz) 64.5 kg (142 lb 4.8 oz) 64.1 kg (141 lb 6.4 oz)    Examination:  General exam: Appears calm and comfortable  Respiratory system: Minimal expiratory wheezing. Respiratory effort normal. Cardiovascular system: S1 & S2 heard, Irregularly irregular. No JVD, murmurs, rubs, gallops or clicks. No pedal edema. Gastrointestinal system: Abdomen is nondistended, soft and nontender. No organomegaly or masses felt. Normal bowel sounds heard. Central nervous system: Alert and oriented. No focal neurological deficits. Extremities: Symmetric 5 x 5 power. Skin: No rashes, lesions or ulcers Psychiatry: Judgement and insight appear normal. Mood & affect appropriate.     Data Reviewed: I have personally reviewed following labs and imaging studies  CBC:  Recent Labs Lab 07/27/16 0948 07/27/16 1013 07/28/16 0421 07/30/16 0303  WBC 11.9*  --  5.6 6.6  HGB 12.1 12.9 11.0* 12.1  HCT 38.1 38.0 35.4* 38.3  MCV 81.6  --  81.6 81.3  PLT 240  --  210 [id number]   Basic Metabolic Panel:  Recent Labs Lab 07/27/16 0952 07/27/16 1013 07/27/16 1148 07/28/16 0421 07/30/16 0303 07/31/16 0418 07/31/16 0842  NA 139 140  --  139 140 141   --   K 4.1 4.1  --  4.0 3.6 3.5  --   CL 109 106  --  108 102 103  --   CO2 22  --   --  23 30 29   --   GLUCOSE 130* 126*  --  144* 141* 135*  --   BUN 40* 40*  --  28* 36* 36*  --   CREATININE 0.98 0.90  --  0.98 1.29* 0.92  --   CALCIUM 9.1  --   --  8.9 9.5 9.3  --   MG 2.1  --  2.8*  --   --   --  2.3  PHOS  --   --  2.7  --   --   --   --    GFR: Estimated Creatinine Clearance: 44.9 mL/min (by C-G formula based on SCr of 0.92 mg/dL). Liver Function Tests:  Recent Labs Lab 07/27/16 0952 07/28/16 0421  AST 40 25  ALT 42 33  ALKPHOS 72 63  BILITOT 0.6 0.5  PROT 6.0*  5.7*  ALBUMIN 3.8 3.5   No results for input(s): LIPASE, AMYLASE in the last 168 hours. No results for input(s): AMMONIA in the last 168 hours. Coagulation Profile:  Recent Labs Lab 07/27/16 0952 07/28/16 0421 07/29/16 0746 07/30/16 0303 07/31/16 0418  INR 2.90 3.60 4.55* 3.93 3.29   Cardiac Enzymes:  Recent Labs Lab 07/27/16 1148 07/27/16 1441 07/27/16 1741  TROPONINI <0.03 <0.03 <0.03   BNP (last 3 results) No results for input(s): PROBNP in the last 8760 hours. HbA1C: No results for input(s): HGBA1C in the last 72 hours. CBG: No results for input(s): GLUCAP in the last 168 hours. Lipid Profile: No results for input(s): CHOL, HDL, LDLCALC, TRIG, CHOLHDL, LDLDIRECT in the last 72 hours. Thyroid Function Tests: No results for input(s): TSH, T4TOTAL, FREET4, T3FREE, THYROIDAB in the last 72 hours. Anemia Panel: No results for input(s): VITAMINB12, FOLATE, FERRITIN, TIBC, IRON, RETICCTPCT in the last 72 hours. Sepsis Labs:  Recent Labs Lab 07/27/16 1013 07/27/16 1148 07/27/16 1449 07/29/16 0746 07/31/16 0418  PROCALCITON  --  <0.10  --  <0.10 <0.10  LATICACIDVEN 1.62  --  2.59*  --   --     Recent Results (from the past 240 hour(s))  MRSA PCR Screening     Status: None   Collection Time: 07/27/16  6:28 PM  Result Value Ref Range Status   MRSA by PCR NEGATIVE NEGATIVE Final     Comment:        The GeneXpert MRSA Assay (FDA approved for NASAL specimens only), is one component of a comprehensive MRSA colonization surveillance program. It is not intended to diagnose MRSA infection nor to guide or monitor treatment for MRSA infections.          Radiology Studies: No results found.      Scheduled Meds: . citalopram  20 mg Oral Daily  . digoxin  0.125 mg Oral Daily  . diltiazem  90 mg Oral Q8H  . doxycycline (VIBRAMYCIN) IV  100 mg Intravenous Q12H  . ezetimibe  10 mg Oral Daily  . furosemide  20 mg Oral Daily  . guaiFENesin  1,200 mg Oral BID  . irbesartan  150 mg Oral q morning - 10a  . methylPREDNISolone (SOLU-MEDROL) injection  60 mg Intravenous Daily  . mirabegron ER  50 mg Oral Daily  . mometasone-formoterol  2 puff Inhalation BID  . montelukast  10 mg Oral Daily  . pantoprazole  80 mg Oral Daily  . sodium chloride flush  3 mL Intravenous Q12H  . sodium chloride flush  3 mL Intravenous Q12H  . tiotropium  1 capsule Inhalation Daily  . Warfarin - Pharmacist Dosing Inpatient   Does not apply q1800   Continuous Infusions: . diltiazem (CARDIZEM) infusion Stopped (07/29/16 1030)     LOS: 4 days    Time spent: 36 minutes    Conway Fedora, MD [facility] Pager [phone]  If 7PM-7AM, please contact night-coverage [email or web address] Password [facility] 07/31/2016, 11:47 AM

## 2016-07-31 NOTE — Progress Notes (Signed)
ANTICOAGULATION CONSULT NOTE - Follow Up Consult  Pharmacy Consult for Coumadin Indication: atrial fibrillation and stroke  Allergies  Allergen Reactions  . Clarithromycin Other (See Comments)    REACTION: possible rash but could have been legionarres dz with rash  . Lipitor [Atorvastatin] Other (See Comments)    MUSCLE SPASMS  . Simvastatin Other (See Comments)    Muscle and joint pain  . Crestor [Rosuvastatin Calcium]     fagitue and joint pain, "NO STATINS"    Patient Measurements: Height: 5\' 4"  (162.6 cm) Weight: 141 lb 6.4 oz (64.1 kg) IBW/kg (Calculated) : 54.7  Vital Signs: Temp: 98 F (36.7 C) (08/27 0510) Temp Source: Oral (08/27 0510) BP: 132/74 (08/27 0510) Pulse Rate: 81 (08/27 0510)  Labs:  Recent Labs  07/29/16 0746 07/30/16 0303 07/31/16 0418  HGB  --  12.1  --   HCT  --  38.3  --   PLT  --  224  --   LABPROT 44.4* 39.5* 34.2*  INR 4.55* 3.93 3.29  CREATININE  --  1.29* 0.92    Estimated Creatinine Clearance: 44.9 mL/min (by C-G formula based on SCr of 0.92 mg/dL).  Assessment: 21 yof on chronic coumadin for history of CVA, now with new onset afib. INR on admission was therapeutic at 2.9 and dose given. INR trended up to 4.55 and is now trending back down to 3.29. She hasn't had any coumadin since 8/23. Possible drug interaction with doxycycline - stop date in place for 8/28. No bleeding. Noted plan for possible DCCV tomorrow.  PTA dose: 5mg  TTSS, 7.5mg  MWF   Goal of Therapy:  INR 2-3 Monitor platelets by anticoagulation protocol: Yes   Plan:  1) Give small dose of coumadin 2.5mg  tonight 2) Daily INR  Nena Jordan, PharmD, BCPS 07/31/16 11:56 AM

## 2016-07-31 NOTE — Progress Notes (Signed)
    SUBJECTIVE:  Breathing is better.  She slept well   PHYSICAL EXAM Vitals:   07/30/16 1347 07/30/16 2017 07/30/16 2157 07/31/16 0510  BP: 131/87 112/76 134/80 132/74  Pulse: 75 85 74 81  Resp: 17 20  18   Temp: 98.4 F (36.9 C) 97.9 F (36.6 C)  98 F (36.7 C)  TempSrc: Oral Oral  Oral  SpO2: 94% 95%  96%  Weight:    141 lb 6.4 oz (64.1 kg)  Height:       General:  No acute distress Lungs:  Decreased breath sounds no wheezing Heart:  Irregular Abdomen:  Positive bowel sounds, no rebound no guarding Extremities:  No edema   LABS: Lab Results  Component Value Date   TROPONINI <0.03 07/27/2016   Results for orders placed or performed during the hospital encounter of 07/27/16 (from the past 24 hour(s))  Procalcitonin     Status: None   Collection Time: 07/31/16  4:18 AM  Result Value Ref Range   Procalcitonin <0.10 ng/mL  Protime-INR     Status: Abnormal   Collection Time: 07/31/16  4:18 AM  Result Value Ref Range   Prothrombin Time 34.2 (H) 11.4 - 15.2 seconds   INR [id number]   Basic metabolic panel     Status: Abnormal   Collection Time: 07/31/16  4:18 AM  Result Value Ref Range   Sodium 141 135 - 145 mmol/L   Potassium 3.5 3.5 - 5.1 mmol/L   Chloride 103 101 - 111 mmol/L   CO2 29 22 - 32 mmol/L   Glucose, Bld 135 (H) 65 - 99 mg/dL   BUN 36 (H) 6 - 20 mg/dL   Creatinine, Ser 0.92 0.44 - 1.00 mg/dL   Calcium 9.3 8.9 - 10.3 mg/dL   GFR calc non Af Amer 59 (L) >60 mL/min   GFR calc Af Amer >60 >60 mL/min   Anion gap 9 5 - 15  Magnesium     Status: None   Collection Time: 07/31/16  8:42 AM  Result Value Ref Range   Magnesium 2.3 1.7 - 2.4 mg/dL    Intake/Output Summary (Last 24 hours) at 07/31/16 [id number] Last data filed at 07/30/16 1347  Gross per 24 hour  Intake              120 ml  Output                0 ml  Net              120 ml    ASSESSMENT AND PLAN:  ACUTE ON CHRONIC SYSTOLIC AND DIASTOLIC HF:  Creat was up yesterday but back down today.  I have  reduced the Lasix to once po daily.   I/O are incomplete.   Continue current therapy.    ATRIAL FIB:  On oral Dilt.  Oral dig started yesterday.   She is not scheduled for a cardioversion on Monday.  We will not find out until Monday AM whether we can get her on the schedule.  Continue with rate control and anticoagulation for now.   I will keep NPO tonight.   Jeneen Rinks Cumi Sanagustin 07/31/2016 9:22 AM

## 2016-08-01 ENCOUNTER — Inpatient Hospital Stay (HOSPITAL_COMMUNITY): Payer: Medicare Other | Admitting: Certified Registered Nurse Anesthetist

## 2016-08-01 ENCOUNTER — Encounter (HOSPITAL_COMMUNITY): Payer: Self-pay

## 2016-08-01 ENCOUNTER — Encounter (HOSPITAL_COMMUNITY)
Admission: EM | Disposition: A | Discharge status: Home / Self Care | Payer: Self-pay | Source: Home / Self Care | Attending: Internal Medicine

## 2016-08-01 DIAGNOSIS — G894 Chronic pain syndrome: Secondary | ICD-10-CM

## 2016-08-01 DIAGNOSIS — I5023 Acute on chronic systolic (congestive) heart failure: Secondary | ICD-10-CM

## 2016-08-01 HISTORY — PX: CARDIOVERSION: SHX1299

## 2016-08-01 LAB — BASIC METABOLIC PANEL
Anion gap: 11 (ref 5–15)
BUN: 35 mg/dL — ABNORMAL HIGH (ref 6–20)
CO2: 26 mmol/L (ref 22–32)
Calcium: 9.3 mg/dL (ref 8.9–10.3)
Chloride: 101 mmol/L (ref 101–111)
Creatinine, Ser: 0.98 mg/dL (ref 0.44–1.00)
GFR calc Af Amer: >60 mL/min (ref >60)
GFR calc non Af Amer: 55 mL/min — ABNORMAL LOW (ref >60)
Glucose, Bld: 144 mg/dL — ABNORMAL HIGH (ref 65–99)
Potassium: 4.1 mmol/L (ref 3.5–5.1)
Sodium: 138 mmol/L (ref 135–145)

## 2016-08-01 LAB — PROTIME-INR
INR: 2.5
Prothrombin Time: 27.5 seconds — ABNORMAL HIGH (ref 11.4–15.2)

## 2016-08-01 SURGERY — CARDIOVERSION
Anesthesia: Monitor Anesthesia Care

## 2016-08-01 MED ORDER — PROPOFOL 10 MG/ML IV BOLUS
INTRAVENOUS | Status: DC | PRN
Start: 2016-08-01 — End: 2016-08-01
  Administered 2016-08-01: 80 mg via INTRAVENOUS

## 2016-08-01 MED ORDER — SODIUM CHLORIDE 0.9% FLUSH
3.0000 mL | Freq: Two times a day (BID) | INTRAVENOUS | Status: DC
  Administered 2016-08-01 (×2): 3 mL via INTRAVENOUS

## 2016-08-01 MED ORDER — LIDOCAINE HCL (CARDIAC) 20 MG/ML IV SOLN
INTRAVENOUS | Status: DC | PRN
  Administered 2016-08-01: 100 mg via INTRAVENOUS

## 2016-08-01 MED ORDER — SODIUM CHLORIDE 0.9% FLUSH: 3.0000 mL | INTRAVENOUS | Status: DC | PRN

## 2016-08-01 MED ORDER — SODIUM CHLORIDE 0.9 % IV SOLN: 250.0000 mL | INTRAVENOUS | Status: DC

## 2016-08-01 MED ORDER — WARFARIN SODIUM 5 MG PO TABS
5.0000 mg | ORAL_TABLET | Freq: Once | ORAL | Status: AC
  Administered 2016-08-01: 5 mg via ORAL
  Filled 2016-08-01: qty 1

## 2016-08-01 MED ORDER — PREDNISONE 20 MG PO TABS
60.0000 mg | ORAL_TABLET | Freq: Every day | ORAL | Status: DC
  Administered 2016-08-01 – 2016-08-02 (×2): 60 mg via ORAL
  Filled 2016-08-01 (×3): qty 3

## 2016-08-01 NOTE — Progress Notes (Signed)
Pt back from cardioversion, CCMD notified.  Pt currently in NSR with BBB.  Vitals assessed and WNL.  Call bell within reach and family at bedside.

## 2016-08-01 NOTE — Progress Notes (Signed)
PROGRESS NOTE    [name]  [id number] DOB: 1940-12-24 DOA: 07/27/2016 PCP: Cathlean Cower, MD    Brief Narrative:  75 y.o.WF PMHx clotting disorder on chronic anticoagulation/Coumadin, COPD, esophageal stricture, GERD, Hiatal Hernia, CVA, HLD, HTN, Nephrolithiasis,   Presenting with several week history of progressive shortness of breath. Associated with intermittent bouts of a rapid heartbeat and palpitations. Patient states she's been "fighting a COPD exacerbation. "Cough is nonproductive but getting worse, more frequent. Patient was placed on a prednisone taper over the last 24 hours without any relief. Patient states that symptoms became acutely worse this morning and was unable to ambulate due to the severe shortness of breath and chest tightness. EMS was called by patient's friend who evaluated patient and stated that heart rate was in the 140s. Diltiazem 20 mg given with improvement of heart rate into the 60s. Breathing treatments given with improvement in respiratory status. Denies fevers, radiation of chest tightness to neck or arm, headache, and neck stiffness, dysuria, frequency, abdominal pain, diarrhea, medication, bright red blood per rectum, melena, hematemesis. On Coumadin for coagulopathy   Assessment & Plan:   Principal Problem:   Acute respiratory failure ([place]) Active Problems:   Gold B Copd with acute bronchitis   Long term current use of Coumadin   Atrial fibrillation with rapid ventricular response (HCC)   Combined congestive systolic and diastolic heart failure (HCC)   HLD (hyperlipidemia)   Essential hypertension   GERD (gastroesophageal reflux disease)   Insomnia   Depression with anxiety   New onset atrial fibrillation (HCC)   Respiratory distress   Chronic pain syndrome  Acute respiratory failure with hypoxia:  -Likely multifactorial including CHF exacerbation and COPD exacerbation. Patient with 5 pound weight gain over the last week prior to  admission, with no home diuretic.  - congestion without consolidation suggestive of pneumonia. Last Echo showing EF of [id number] grade 2 diastolic dysfunction.  - Xopenex QID - Continue change IV Solu-Medrol to oral prednisone taper.  - Mucinex - Doxycycline: Complete 5 day course - Titrate O2 to maintain SPO2 89-93%  -Singular 10 mg daily -Dulera 200-5 micrograms BID  Atrial fibrillation with RVR:(New diagnoses:CHA2DS2-VASc = 8.)  -By patient's description of symptoms most likely in A. fib for some time -On chronic anticoagulation for clotting disorder?.  -Converted to Diltiazem PO 90 mg  TID for rate control.  - Cardiology plans on performing DCCV today.  -Per cardiology.  Systolic and Diastolic CHF Clinical improvement. Current weight at 64.1 kg from 64.5 kg from 66 kg on 07/29/2016 from 67.8 kg 07/28/2016 from 67.1 kg 07/27/2016. Patient is -1.84 L over the past 24 hours and -4.847 L over this hospitalization. Patient with a bump in her creatinine which has trended back down with change in Lasix dose of 20 mg daily. Continue digoxin, diltiazem, Avapro.Strict I/Os, daily weights. Per cardiology.      Filed Weights   07/27/16 1029 07/28/16 0500 07/29/16 0400  Weight: 67.1 kg (148 lb) 67.8 kg (149 lb 7.6 oz) 66 kg (145 lb 9.6 oz)   HLD: - continue Zetia  HTN: - See CHF  Chronic pain: - continue flexeril 5 mg TID  GERD/esophageal stricture/hiatal hernia: - continue PPI  Depression/Anxiety: - continue celexa 20 mg daily - Add ativan PRN as pt very anxious  Insomnia: - continue melatonin     DVT prophylaxis: Coumadin. INR currently therapeutic at 2.50. Code Status: Full Family Communication: Updated patient.No family at bedside. Disposition Plan: Home when medically stable  and post cardioversion and per cardiology.   Consultants:   Cardiology: Dr. Ellyn Hack 07/27/2016  Procedures:   Chest x-ray 07/27/2016, 07/28/2016  Antimicrobials:   Doxycycline  07/27/2016   Subjective: Patient states shortness of breath improved. Patient denies any chest pain. Patient states she's feeling better.  Objective: Vitals:   07/31/16 2205 08/01/16 0433 08/01/16 0636 08/01/16 1041  BP: (!) 143/66 (!) 149/71 140/72   Pulse:  91  90  Resp:  20    Temp:  97.8 F (36.6 C)    TempSrc:  Oral    SpO2:  98%    Weight:  63.2 kg (139 lb 4.8 oz)    Height:       No intake or output data in the 24 hours ending 08/01/16 1118 Filed Weights   07/30/16 0550 07/31/16 0510 08/01/16 0433  Weight: 64.5 kg (142 lb 4.8 oz) 64.1 kg (141 lb 6.4 oz) 63.2 kg (139 lb 4.8 oz)    Examination:  General exam: Appears calm and comfortable  Respiratory system: CTAB. Respiratory effort normal. Cardiovascular system: S1 & S2 heard, Irregularly irregular. No JVD, murmurs, rubs, gallops or clicks. No pedal edema. Gastrointestinal system: Abdomen is nondistended, soft and nontender. No organomegaly or masses felt. Normal bowel sounds heard. Central nervous system: Alert and oriented. No focal neurological deficits. Extremities: Symmetric 5 x 5 power. Skin: No rashes, lesions or ulcers Psychiatry: Judgement and insight appear normal. Mood & affect appropriate.     Data Reviewed: I have personally reviewed following labs and imaging studies  CBC:  Recent Labs Lab 07/27/16 0948 07/27/16 1013 07/28/16 0421 07/30/16 0303  WBC 11.9*  --  5.6 6.6  HGB 12.1 12.9 11.0* 12.1  HCT 38.1 38.0 35.4* 38.3  MCV 81.6  --  81.6 81.3  PLT 240  --  210 [id number]   Basic Metabolic Panel:  Recent Labs Lab 07/27/16 0952 07/27/16 1013 07/27/16 1148 07/28/16 0421 07/30/16 0303 07/31/16 0418 07/31/16 0842 08/01/16 0258  NA 139 140  --  139 140 141  --  138  K 4.1 4.1  --  4.0 3.6 3.5  --  4.1  CL 109 106  --  108 102 103  --  101  CO2 22  --   --  23 30 29   --  26  GLUCOSE 130* 126*  --  144* 141* 135*  --  144*  BUN 40* 40*  --  28* 36* 36*  --  35*  CREATININE 0.98 0.90  --   0.98 1.29* 0.92  --  0.98  CALCIUM 9.1  --   --  8.9 9.5 9.3  --  9.3  MG 2.1  --  2.8*  --   --   --  2.3  --   PHOS  --   --  2.7  --   --   --   --   --    GFR: Estimated Creatinine Clearance: 42.2 mL/min (by C-G formula based on SCr of 0.98 mg/dL). Liver Function Tests:  Recent Labs Lab 07/27/16 0952 07/28/16 0421  AST 40 25  ALT 42 33  ALKPHOS 72 63  BILITOT 0.6 0.5  PROT 6.0* 5.7*  ALBUMIN 3.8 3.5   No results for input(s): LIPASE, AMYLASE in the last 168 hours. No results for input(s): AMMONIA in the last 168 hours. Coagulation Profile:  Recent Labs Lab 07/28/16 0421 07/29/16 0746 07/30/16 0303 07/31/16 0418 08/01/16 0258  INR 3.60 4.55* 3.93 3.29 2.50  Cardiac Enzymes:  Recent Labs Lab 07/27/16 1148 07/27/16 1441 07/27/16 1741  TROPONINI <0.03 <0.03 <0.03   BNP (last 3 results) No results for input(s): PROBNP in the last 8760 hours. HbA1C: No results for input(s): HGBA1C in the last 72 hours. CBG: No results for input(s): GLUCAP in the last 168 hours. Lipid Profile: No results for input(s): CHOL, HDL, LDLCALC, TRIG, CHOLHDL, LDLDIRECT in the last 72 hours. Thyroid Function Tests: No results for input(s): TSH, T4TOTAL, FREET4, T3FREE, THYROIDAB in the last 72 hours. Anemia Panel: No results for input(s): VITAMINB12, FOLATE, FERRITIN, TIBC, IRON, RETICCTPCT in the last 72 hours. Sepsis Labs:  Recent Labs Lab 07/27/16 1013 07/27/16 1148 07/27/16 1449 07/29/16 0746 07/31/16 0418  PROCALCITON  --  <0.10  --  <0.10 <0.10  LATICACIDVEN 1.62  --  2.59*  --   --     Recent Results (from the past 240 hour(s))  MRSA PCR Screening     Status: None   Collection Time: 07/27/16  6:28 PM  Result Value Ref Range Status   MRSA by PCR NEGATIVE NEGATIVE Final    Comment:        The GeneXpert MRSA Assay (FDA approved for NASAL specimens only), is one component of a comprehensive MRSA colonization surveillance program. It is not intended to diagnose  MRSA infection nor to guide or monitor treatment for MRSA infections.          Radiology Studies: No results found.      Scheduled Meds: . citalopram  20 mg Oral Daily  . digoxin  0.125 mg Oral Daily  . diltiazem  90 mg Oral Q8H  . doxycycline (VIBRAMYCIN) IV  100 mg Intravenous Q12H  . ezetimibe  10 mg Oral Daily  . furosemide  20 mg Oral Daily  . guaiFENesin  1,200 mg Oral BID  . irbesartan  150 mg Oral q morning - 10a  . mirabegron ER  50 mg Oral Daily  . mometasone-formoterol  2 puff Inhalation BID  . montelukast  10 mg Oral Daily  . pantoprazole  80 mg Oral Daily  . predniSONE  60 mg Oral QAC breakfast  . sodium chloride flush  3 mL Intravenous Q12H  . sodium chloride flush  3 mL Intravenous Q12H  . tiotropium  1 capsule Inhalation Daily  . Warfarin - Pharmacist Dosing Inpatient   Does not apply q1800   Continuous Infusions: . diltiazem (CARDIZEM) infusion Stopped (07/29/16 1030)     LOS: 5 days    Time spent: 47 minutes    Panda Crossin, MD [facility] Pager [phone]  If 7PM-7AM, please contact night-coverage [email or web address] Password [email or web address] 08/01/2016, 11:18 AM

## 2016-08-01 NOTE — Anesthesia Preprocedure Evaluation (Signed)
Anesthesia Evaluation  Patient identified by MRN, date of birth, ID band Patient awake    Reviewed: Allergy & Precautions, NPO status , Patient's Chart, lab work & pertinent test results  Airway Mallampati: I  TM Distance: >3 FB Neck ROM: Full    Dental   Pulmonary COPD, former smoker,    Pulmonary exam normal        Cardiovascular hypertension, Normal cardiovascular exam+ dysrhythmias Atrial Fibrillation      Neuro/Psych    GI/Hepatic GERD  Medicated and Controlled,  Endo/Other    Renal/GU      Musculoskeletal   Abdominal   Peds  Hematology   Anesthesia Other Findings   Reproductive/Obstetrics                             Anesthesia Physical Anesthesia Plan  ASA: III  Anesthesia Plan: General   Post-op Pain Management:    Induction: Intravenous  Airway Management Planned: Mask  Additional Equipment:   Intra-op Plan:   Post-operative Plan: Extubation in OR  Informed Consent: I have reviewed the patients History and Physical, chart, labs and discussed the procedure including the risks, benefits and alternatives for the proposed anesthesia with the patient or authorized representative who has indicated his/her understanding and acceptance.     Plan Discussed with: CRNA and Surgeon  Anesthesia Plan Comments:         Anesthesia Quick Evaluation

## 2016-08-01 NOTE — Transfer of Care (Signed)
Immediate Anesthesia Transfer of Care Note  Patient: [name]  Procedure(s) Performed: Procedure(s): CARDIOVERSION (N/A)  Patient Location: Endoscopy Unit  Anesthesia Type:MAC  Level of Consciousness: awake, alert  and patient cooperative  Airway & Oxygen Therapy: Patient Spontanous Breathing  Post-op Assessment: Report given to RN and Post -op Vital signs reviewed and stable  Post vital signs: Reviewed and stable  Last Vitals:  Vitals:   08/01/16 1346 08/01/16 1347  BP:    Pulse: 63 64  Resp: 16 17  Temp:      Last Pain:  Vitals:   08/01/16 1315  TempSrc: Oral         Complications: No apparent anesthesia complications

## 2016-08-01 NOTE — H&P (View-Only) (Signed)
    Subjective:  Feeling a little better but still with palpitations. Breathing improved, no chest pain.   Objective:  Vital Signs in the last 24 hours: Temp:  [97.8 F (36.6 C)-98.4 F (36.9 C)] 97.8 F (36.6 C) (08/28 0433) Pulse Rate:  [65-91] 90 (08/28 1041) Resp:  [20] 20 (08/28 0433) BP: (133-149)/(66-81) 140/72 (08/28 0636) SpO2:  [96 %-98 %] 98 % (08/28 0433) Weight:  [63.2 kg (139 lb 4.8 oz)] 63.2 kg (139 lb 4.8 oz) (08/28 0433)  Intake/Output from previous day: No intake/output data recorded.  Physical Exam: Pt is alert and oriented, pleasant woman in NAD HEENT: normal Neck: JVP - normal Lungs: Few rhonchi bilaterally CV: Irregularly irregular without murmur or gallop Abd: soft, NT, Positive BS, no hepatomegaly Ext: no C/C/E, distal pulses intact and equal Skin: warm/dry no rash   Lab Results:  Recent Labs  07/30/16 0303  WBC 6.6  HGB 12.1  PLT 224    Recent Labs  07/31/16 0418 08/01/16 0258  NA 141 138  K 3.5 4.1  CL 103 101  CO2 29 26  GLUCOSE 135* 144*  BUN 36* 35*  CREATININE 0.92 0.98   No results for input(s): TROPONINI in the last 72 hours.  Invalid input(s): CK, MB  Cardiac Studies: Last echo 06/24/2016: Study Conclusions  - Left ventricle: The cavity size was at the upper limits of   normal. Wall thickness was normal. Systolic function was mildly   reduced. The estimated ejection fraction was in the range of 45%   to 50%. Diffuse hypokinesis. Features are consistent with a   pseudonormal left ventricular filling pattern, with concomitant   abnormal relaxation and increased filling pressure (grade 2   diastolic dysfunction). - Ventricular septum: Septal motion showed paradox. These changes   are consistent with intraventricular conduction delay. - Mitral valve: There was moderate regurgitation directed   centrally. - Left atrium: The atrium was moderately to severely dilated. - Atrial septum: The septum bowed from left to  right, consistent   with increased left atrial pressure. No defect or patent foramen   ovale was identified.  Impressions:  - Incessant PVCs seen throughout the study.  Tele: Atrial fibrillation with frequent PVCs  Assessment/Plan:  1. Atrial fibrillation, persistent. This is a new diagnosis for this patient. 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 INRs have been therapeutic. Currently rate controlled with a combination of diltiazem and digoxin. Plans for cardioversion today. I have reviewed risks, indications, and alternatives to elective cardioversion with the patient. She understands and agrees to proceed. She is scheduled for 1 PM.  2. Acute on chronic systolic heart failure: Volume status looks good on exam. She is on oral diuretics. She is currently on diltiazem. I think this is probably okay since she just has very mild LV dysfunction and she really is not a good candidate for a beta blocker with an acute COPD exacerbation.  Disposition: Pending cardioversion today   Sherren Mocha, M.D. 08/01/2016, 11:30 AM

## 2016-08-01 NOTE — Interval H&P Note (Signed)
History and Physical Interval Note:  08/01/2016 1:30 PM  [name]  has presented today for surgery, with the diagnosis of atrial fib  The various methods of treatment have been discussed with the patient and family. After consideration of risks, benefits and other options for treatment, the patient has consented to  Procedure(s): CARDIOVERSION (N/A) as a surgical intervention .  The patient's history has been reviewed, patient examined, no change in status, stable for surgery.  I have reviewed the patient's chart and labs.  Questions were answered to the patient's satisfaction.     [employer]

## 2016-08-01 NOTE — Care Management Important Message (Signed)
Important Message  Patient Details  Name: [name] MRN: [id number] Date of Birth: 1941/03/12   Medicare Important Message Given:  Yes    Nathen May 08/01/2016, 12:06 PM

## 2016-08-01 NOTE — Progress Notes (Signed)
ANTICOAGULATION CONSULT NOTE - Follow Up Consult  Pharmacy Consult for Coumadin Indication: atrial fibrillation and stroke  Allergies  Allergen Reactions  . Clarithromycin Other (See Comments)    REACTION: possible rash but could have been legionarres dz with rash  . Lipitor [Atorvastatin] Other (See Comments)    MUSCLE SPASMS  . Simvastatin Other (See Comments)    Muscle and joint pain  . Crestor [Rosuvastatin Calcium]     fagitue and joint pain, "NO STATINS"    Patient Measurements: Height: 5\' 4"  (162.6 cm) Weight: 139 lb 4.8 oz (63.2 kg) IBW/kg (Calculated) : 54.7  Vital Signs: Temp: 97.8 F (36.6 C) (08/28 0433) Temp Source: Oral (08/28 0433) BP: 140/72 (08/28 0636) Pulse Rate: 90 (08/28 1041)  Labs:  Recent Labs  07/30/16 0303 07/31/16 0418 08/01/16 0258  HGB 12.1  --   --   HCT 38.3  --   --   PLT 224  --   --   LABPROT 39.5* 34.2* 27.5*  INR 3.93 3.29 2.50  CREATININE 1.29* 0.92 0.98    Estimated Creatinine Clearance: 42.2 mL/min (by C-G formula based on SCr of 0.98 mg/dL).  Assessment: 61 yof on chronic Coumadin 5mg  daily exc for 7.5mg  on MWF for afib, hx of CVA, last dose taken 8/23 when INR was 2.9. Continued to trend up and peaked at 4.55. Now back down to 2.5 after restarting lower dose of Coumadin last night. Plan for cardioversion today. Possible switch to DOAC? Hgb and plts stable. Also currently on doxy.  Goal of Therapy:  INR 2-3 Monitor platelets by anticoagulation protocol: Yes   Plan:  Give Coumadin 5mg  PO x 1 Monitor daily INR, CBC, s/s of bleed  Elenor Quinones, PharmD, [facility] Clinical Pharmacist Pager [phone] 08/01/2016 12:39 PM

## 2016-08-01 NOTE — Anesthesia Postprocedure Evaluation (Signed)
Anesthesia Post Note  Patient: [name]  Procedure(s) Performed: Procedure(s) (LRB): CARDIOVERSION (N/A)  Patient location during evaluation: PACU Anesthesia Type: General Level of consciousness: awake and alert Pain management: pain level controlled Vital Signs Assessment: post-procedure vital signs reviewed and stable Respiratory status: spontaneous breathing, nonlabored ventilation, respiratory function stable and patient connected to nasal cannula oxygen Cardiovascular status: blood pressure returned to baseline and stable Postop Assessment: no signs of nausea or vomiting Anesthetic complications: no    Last Vitals:  Vitals:   08/01/16 1410 08/01/16 1432  BP: (!) 159/57 (!) 144/92  Pulse: 70 70  Resp: 17 18  Temp:      Last Pain:  Vitals:   08/01/16 1315  TempSrc: Oral                 [name] DAVID

## 2016-08-01 NOTE — Progress Notes (Signed)
Patient lying in bed. States that she had a fluttering sensation on her left side earlier today but has not felt it in a long time. Advised to let me know if it happens again, will continue to monitor. Call light within reach.

## 2016-08-01 NOTE — Consult Note (Signed)
   [facility] Inpatient Consult   08/01/2016  [name] 07-11-1941 [id number]  Patient was evaluated for [facility] services for [facility] services and needs.  Came by to see the patient and she was receiving nursing care and assessment, patient just returning from a procedure [Cardioversion]. Will follow up at a more appropriate time.  This is the patient's second admission in 6 months.  Current admission with Atrial Fibrillation with RVR.     For questions, please contact:  Natividad Brood, RN BSN [facility] Liaison  [phone] business mobile phone Toll free office [phone]

## 2016-08-01 NOTE — Progress Notes (Signed)
    Subjective:  Feeling a little better but still with palpitations. Breathing improved, no chest pain.   Objective:  Vital Signs in the last 24 hours: Temp:  [97.8 F (36.6 C)-98.4 F (36.9 C)] 97.8 F (36.6 C) (08/28 0433) Pulse Rate:  [65-91] 90 (08/28 1041) Resp:  [20] 20 (08/28 0433) BP: (133-149)/(66-81) 140/72 (08/28 0636) SpO2:  [96 %-98 %] 98 % (08/28 0433) Weight:  [63.2 kg (139 lb 4.8 oz)] 63.2 kg (139 lb 4.8 oz) (08/28 0433)  Intake/Output from previous day: No intake/output data recorded.  Physical Exam: Pt is alert and oriented, pleasant woman in NAD HEENT: normal Neck: JVP - normal Lungs: Few rhonchi bilaterally CV: Irregularly irregular without murmur or gallop Abd: soft, NT, Positive BS, no hepatomegaly Ext: no C/C/E, distal pulses intact and equal Skin: warm/dry no rash   Lab Results:  Recent Labs  07/30/16 0303  WBC 6.6  HGB 12.1  PLT 224    Recent Labs  07/31/16 0418 08/01/16 0258  NA 141 138  K 3.5 4.1  CL 103 101  CO2 29 26  GLUCOSE 135* 144*  BUN 36* 35*  CREATININE 0.92 0.98   No results for input(s): TROPONINI in the last 72 hours.  Invalid input(s): CK, MB  Cardiac Studies: Last echo 06/24/2016: Study Conclusions  - Left ventricle: The cavity size was at the upper limits of   normal. Wall thickness was normal. Systolic function was mildly   reduced. The estimated ejection fraction was in the range of 45%   to 50%. Diffuse hypokinesis. Features are consistent with a   pseudonormal left ventricular filling pattern, with concomitant   abnormal relaxation and increased filling pressure (grade 2   diastolic dysfunction). - Ventricular septum: Septal motion showed paradox. These changes   are consistent with intraventricular conduction delay. - Mitral valve: There was moderate regurgitation directed   centrally. - Left atrium: The atrium was moderately to severely dilated. - Atrial septum: The septum bowed from left to  right, consistent   with increased left atrial pressure. No defect or patent foramen   ovale was identified.  Impressions:  - Incessant PVCs seen throughout the study.  Tele: Atrial fibrillation with frequent PVCs  Assessment/Plan:  1. Atrial fibrillation, persistent. This is a new diagnosis for this patient. 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 INRs have been therapeutic. Currently rate controlled with a combination of diltiazem and digoxin. Plans for cardioversion today. I have reviewed risks, indications, and alternatives to elective cardioversion with the patient. She understands and agrees to proceed. She is scheduled for 1 PM.  2. Acute on chronic systolic heart failure: Volume status looks good on exam. She is on oral diuretics. She is currently on diltiazem. I think this is probably okay since she just has very mild LV dysfunction and she really is not a good candidate for a beta blocker with an acute COPD exacerbation.  Disposition: Pending cardioversion today   Sherren Mocha, M.D. 08/01/2016, 11:30 AM

## 2016-08-02 ENCOUNTER — Other Ambulatory Visit: Payer: Self-pay

## 2016-08-02 ENCOUNTER — Other Ambulatory Visit: Payer: Self-pay | Admitting: Cardiology

## 2016-08-02 ENCOUNTER — Encounter (HOSPITAL_COMMUNITY): Payer: Self-pay | Admitting: Cardiology

## 2016-08-02 DIAGNOSIS — I4891 Unspecified atrial fibrillation: Secondary | ICD-10-CM

## 2016-08-02 DIAGNOSIS — R079 Chest pain, unspecified: Secondary | ICD-10-CM

## 2016-08-02 DIAGNOSIS — R0600 Dyspnea, unspecified: Secondary | ICD-10-CM

## 2016-08-02 LAB — BASIC METABOLIC PANEL
Anion gap: 10 (ref 5–15)
BUN: 42 mg/dL — ABNORMAL HIGH (ref 6–20)
CO2: 27 mmol/L (ref 22–32)
Calcium: 9.1 mg/dL (ref 8.9–10.3)
Chloride: 101 mmol/L (ref 101–111)
Creatinine, Ser: 0.98 mg/dL (ref 0.44–1.00)
GFR calc Af Amer: >60 mL/min (ref >60)
GFR calc non Af Amer: 55 mL/min — ABNORMAL LOW (ref >60)
Glucose, Bld: 106 mg/dL — ABNORMAL HIGH (ref 65–99)
Potassium: 3.8 mmol/L (ref 3.5–5.1)
Sodium: 138 mmol/L (ref 135–145)

## 2016-08-02 LAB — PROTIME-INR
INR: 2
Prothrombin Time: 23 seconds — ABNORMAL HIGH (ref 11.4–15.2)

## 2016-08-02 MED ORDER — METOPROLOL SUCCINATE ER 25 MG PO TB24
25.0000 mg | ORAL_TABLET | Freq: Every day | ORAL | Status: DC
  Administered 2016-08-02: 25 mg via ORAL
  Filled 2016-08-02: qty 1

## 2016-08-02 MED ORDER — DILTIAZEM HCL ER COATED BEADS 300 MG PO CP24: 300.0000 mg | ORAL_CAPSULE | Freq: Every day | ORAL | 3 refills | Status: DC

## 2016-08-02 MED ORDER — METOPROLOL SUCCINATE ER 25 MG PO TB24: 25.0000 mg | ORAL_TABLET | Freq: Every day | ORAL | 0 refills | Status: DC

## 2016-08-02 MED ORDER — FUROSEMIDE 20 MG PO TABS
20.0000 mg | ORAL_TABLET | Freq: Every day | ORAL | 0 refills | Status: DC
Start: 2016-08-02 — End: 2016-08-11

## 2016-08-02 MED ORDER — DILTIAZEM HCL ER COATED BEADS 120 MG PO CP24
300.0000 mg | ORAL_CAPSULE | Freq: Every day | ORAL | Status: DC
  Filled 2016-08-02: qty 1

## 2016-08-02 MED ORDER — DILTIAZEM HCL ER COATED BEADS 300 MG PO CP24: 300.0000 mg | ORAL_CAPSULE | Freq: Every day | ORAL | 0 refills | Status: DC

## 2016-08-02 MED ORDER — APIXABAN 5 MG PO TABS: 5.0000 mg | ORAL_TABLET | Freq: Two times a day (BID) | ORAL | 3 refills | Status: DC

## 2016-08-02 MED ORDER — LORAZEPAM 0.5 MG PO TABS: 0.5000 mg | ORAL_TABLET | Freq: Two times a day (BID) | ORAL | 0 refills | Status: DC | PRN

## 2016-08-02 MED ORDER — APIXABAN 5 MG PO TABS
5.0000 mg | ORAL_TABLET | Freq: Two times a day (BID) | ORAL | Status: DC
  Administered 2016-08-02: 5 mg via ORAL
  Filled 2016-08-02: qty 1

## 2016-08-02 MED ORDER — METOPROLOL SUCCINATE ER 25 MG PO TB24: 25.0000 mg | ORAL_TABLET | Freq: Every day | ORAL | 3 refills | Status: DC

## 2016-08-02 MED ORDER — APIXABAN 5 MG PO TABS: 5.0000 mg | ORAL_TABLET | Freq: Two times a day (BID) | ORAL | 0 refills | Status: DC

## 2016-08-02 MED ORDER — PREDNISONE 20 MG PO TABS: 20.0000 mg | ORAL_TABLET | Freq: Every day | ORAL | 0 refills | Status: DC

## 2016-08-02 MED ORDER — WARFARIN SODIUM 7.5 MG PO TABS: 7.5000 mg | ORAL_TABLET | Freq: Once | ORAL | Status: DC

## 2016-08-02 MED ORDER — FUROSEMIDE 20 MG PO TABS: 20.0000 mg | ORAL_TABLET | Freq: Every day | ORAL | 3 refills | Status: DC

## 2016-08-02 MED ORDER — FUROSEMIDE 20 MG PO TABS: 20.0000 mg | ORAL_TABLET | Freq: Every day | ORAL | 0 refills | Status: DC

## 2016-08-02 MED ORDER — DILTIAZEM HCL ER COATED BEADS 180 MG PO CP24
300.0000 mg | ORAL_CAPSULE | Freq: Every day | ORAL | Status: DC
  Administered 2016-08-02: 300 mg via ORAL

## 2016-08-02 NOTE — Care Management Note (Signed)
Case Management Note Marvetta Gibbons RN, BSN Unit 2W-Case Manager [phone]  Patient Details  Name: [name] MRN: [id number] Date of Birth: [birth year]  Subjective/Objective:    Pt admitted with afib, resp. Failure, s/p cardioversion                Action/Plan: PTA pt lived at home with spouse- referral received for Eliquis- per insurance check- S/W RICHARD @ [facility] # [phone]    1. ELIQUIS   5 MG BID  FOR (30 ) AND (90 )   COVER- CO-PAY- 25 % OF COAST  PRIOR APPROVAL - NO  PHARMACY : ANY RETAIL   MAIL -ORDER FOR 90 DAY SUPPLY- FREE   Spoke with pt and spouse- at bedside- per pt she uses mail order for most drugs and would prefer a 90 day script for meds on discharge- 30 day free card for Eliquis given to pt to use on discharge- pt will f/u with setting up mail order with the [place]. Pt also has been set up with the Afib clinic- info given to pt on clinic location and phone #.   Expected Discharge Date:  08/02/16              Expected Discharge Plan:  [place]  In-House Referral:     Discharge planning Services  CM Consult, Medication Assistance  Post Acute Care Choice:    Choice offered to:     DME Arranged:    DME Agency:     HH Arranged:    [place] Agency:     Status of Service:  Completed, signed off  If discussed at [employer] of Stay Meetings, dates discussed:    Discharge Disposition: Home/self care   Additional Comments:  Dawayne Patricia, RN 08/02/2016, 3:53 PM

## 2016-08-02 NOTE — Progress Notes (Signed)
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 She has been on Coumadin since. Other problems include COPD- followed by Dr Ashok Cordia, (she was recently placed on steroids for a cough), GERD s/p esophageal dilatation May 2017, HTN with grade 2 DD, HLD, and prior bradycardia and PVCs.    She presented on 07/27/16 with complaint of increasing dyspnea + orthopnea and was found to be in new onset atrial fibrillation w/ RVR.    Subjective: Feels better than day of admit. No palpitations. Breathing improved. No dyspnea.  Occasionally she feels mild chest pressure with ambulation but not always.   Objective: Vital signs in last 24 hours: Temp:  [97.6 F (36.4 C)-98.2 F (36.8 C)] 97.6 F (36.4 C) (08/29 0422) Pulse Rate:  [39-100] 63 (08/29 1052) Resp:  [10-21] 20 (08/29 0422) BP: (119-161)/(57-115) 119/68 (08/29 1052) SpO2:  [93 %-100 %] 97 % (08/29 0422) Weight:  [138 lb (62.6 kg)] 138 lb (62.6 kg) (08/29 0422) Last BM Date: 08/02/16  Intake/Output from previous day: 08/28 0701 - 08/29 0700 In: 340 [P.O.:240; I.V.:100] Out: 0  Intake/Output this shift: No intake/output data recorded.  Medications Current Facility-Administered Medications  Medication Dose Route Frequency Provider Last Rate Last Dose  . 0.9 %  sodium chloride infusion  250 mL Intravenous PRN Waldemar Dickens, MD 10 mL/hr at 08/01/16 1332 500 mL at 08/01/16 1332  . 0.9 %  sodium chloride infusion  250 mL Intravenous Continuous Jerline Pain, MD      . acetaminophen (TYLENOL) tablet 650 mg  650 mg Oral Q6H PRN Waldemar Dickens, MD       Or  . acetaminophen (TYLENOL) suppository 650 mg  650 mg Rectal Q6H PRN Waldemar Dickens, MD      . azelastine (ASTELIN) 0.1 % nasal spray 2 spray  2 spray Each Nare BID PRN Waldemar Dickens, MD      . citalopram (CELEXA) tablet 20 mg  20 mg Oral Daily Waldemar Dickens, MD   20 mg at 08/02/16 1053  .  cyclobenzaprine (FLEXERIL) tablet 5 mg  5 mg Oral TID PRN Waldemar Dickens, MD      . digoxin Fonnie Birkenhead) tablet 0.125 mg  0.125 mg Oral Daily Leonie Man, MD   0.125 mg at 08/02/16 1052  . diltiazem (CARDIZEM) 100 mg in dextrose 5% 155mL (1 mg/mL) infusion  5-15 mg/hr Intravenous Continuous Duffy Bruce, MD   Stopped at 07/29/16 1030  . diltiazem (CARDIZEM) tablet 90 mg  90 mg Oral Q8H Leonie Man, MD   90 mg at 08/02/16 0604  . ezetimibe (ZETIA) tablet 10 mg  10 mg Oral Daily Waldemar Dickens, MD   10 mg at 08/02/16 1052  . furosemide (LASIX) tablet 20 mg  20 mg Oral Daily Eugenie Filler, MD   20 mg at 08/02/16 1053  . guaiFENesin (MUCINEX) 12 hr tablet 1,200 mg  1,200 mg Oral BID Waldemar Dickens, MD   1,200 mg at 08/02/16 1052  . irbesartan (AVAPRO) tablet 150 mg  150 mg Oral q morning - 10a Waldemar Dickens, MD   150 mg at 08/02/16 1052  . levalbuterol (XOPENEX) nebulizer solution 1.25 mg  1.25 mg Nebulization Q6H PRN Eugenie Filler, MD      . LORazepam (ATIVAN) tablet 0.5 mg  0.5 mg Oral BID PRN Allie Bossier, MD   0.5  mg at 07/31/16 2205  . mirabegron ER (MYRBETRIQ) tablet 50 mg  50 mg Oral Daily Allie Bossier, MD   50 mg at 08/01/16 2027  . mometasone-formoterol (DULERA) 200-5 MCG/ACT inhaler 2 puff  2 puff Inhalation BID Waldemar Dickens, MD   2 puff at 08/02/16 0940  . montelukast (SINGULAIR) tablet 10 mg  10 mg Oral Daily Waldemar Dickens, MD   10 mg at 08/02/16 1052  . ondansetron (ZOFRAN) tablet 4 mg  4 mg Oral Q6H PRN Waldemar Dickens, MD       Or  . ondansetron [facility]) injection 4 mg  4 mg Intravenous Q6H PRN Waldemar Dickens, MD      . oxyCODONE (Oxy IR/ROXICODONE) immediate release tablet 5 mg  5 mg Oral Q4H PRN Waldemar Dickens, MD      . pantoprazole (PROTONIX) EC tablet 80 mg  80 mg Oral Daily Waldemar Dickens, MD   80 mg at 08/02/16 1052  . polyethylene glycol (MIRALAX / GLYCOLAX) packet 17 g  17 g Oral Daily PRN Ritta Slot, NP   17 g at 07/30/16 2205  . predniSONE  (DELTASONE) tablet 60 mg  60 mg Oral QAC breakfast Eugenie Filler, MD   60 mg at 08/02/16 1052  . sodium chloride flush (NS) 0.9 % injection 3 mL  3 mL Intravenous Q12H Waldemar Dickens, MD   3 mL at 08/01/16 1000  . sodium chloride flush (NS) 0.9 % injection 3 mL  3 mL Intravenous Q12H Waldemar Dickens, MD   3 mL at 08/01/16 1000  . sodium chloride flush (NS) 0.9 % injection 3 mL  3 mL Intravenous PRN Waldemar Dickens, MD      . sodium chloride flush (NS) 0.9 % injection 3 mL  3 mL Intravenous Q12H Jerline Pain, MD   3 mL at 08/01/16 2153  . sodium chloride flush (NS) 0.9 % injection 3 mL  3 mL Intravenous PRN Jerline Pain, MD      . tiotropium [facility]) inhalation capsule 18 mcg  1 capsule Inhalation Daily Waldemar Dickens, MD   18 mcg at 08/02/16 [phone]  . warfarin (COUMADIN) tablet 7.5 mg  7.5 mg Oral ONCE-1800 Cecilio Asper Glassmanor, RPH      . Warfarin - Pharmacist Dosing Inpatient   Does not apply q1800 Para March, [facility]        PE: General appearance: alert, cooperative and no distress Neck: no carotid bruit and no JVD Lungs: clear to auscultation bilaterally Heart: irregularly irregular rhythm and regular rate Extremities: no LEE Pulses: 2+ and symmetric Skin: warm and dry Neurologic: Grossly normal  Lab Results:  No results for input(s): WBC, HGB, HCT, PLT in the last 72 hours. BMET  Recent Labs  07/31/16 0418 08/01/16 0258 08/02/16 0305  NA 141 138 138  K 3.5 4.1 3.8  CL 103 101 101  CO2 29 26 27   GLUCOSE 135* 144* 106*  BUN 36* 35* 42*  CREATININE 0.92 0.98 0.98  CALCIUM 9.3 9.3 9.1   PT/INR  Recent Labs  07/31/16 0418 08/01/16 0258 08/02/16 0305  LABPROT 34.2* 27.5* 23.0*  INR 3.29 2.50 2.00    Assessment/Plan    Principal Problem:   Acute respiratory failure (HCC) Active Problems:   Gold B Copd with acute bronchitis   Long term current use of Coumadin   Atrial fibrillation with rapid ventricular response (HCC)   Combined congestive systolic and  diastolic  heart failure (HCC)   HLD (hyperlipidemia)   Essential hypertension   GERD (gastroesophageal reflux disease)   Insomnia   Depression with anxiety   New onset atrial fibrillation (HCC)   Respiratory distress   Chronic pain syndrome   1. Atrial Fibrillation: s/p DCCV yesterday, unfortunately patient reverted back to afib w/ CVR. She is less symptomatic. No resting palpitations and no dyspnea. HR currently in the 90s. BP is stable. Can consider adding an antiarrthymic for rhythm control. She is on coumadin was was on this prior to admit, given prior h/o CVA. She is interested in switching to a NOAC. She gets all of her meds free of charge from [place], so cost is not an issue. Renal function is normal. INR is 2 today. Consider switch to Eliquis or Xarelto. Consider OP stress test to assess for underlying coronary ischemia.   2. Acute on chronic systolic heart failure: Volume status looks good on exam. She is on oral diuretics. She is currently on PO diltiazem- okay since she just has very mild LV dysfunction and she really is not a good candidate for a beta blocker with an acute COPD exacerbation.    LOS: 6 days    Brittainy M. Ladoris Gene 08/02/2016 11:39 AM  Patient seen, examined. Available data reviewed. Agree with findings, assessment, and plan as outlined by Lyda Jester, PA-C. Pt is back in atrial fibrillation today. Exam is remarkable for clear lung fields and an irregularly irregular heart rhythm without murmur. No peripheral edema.   Tele shows atrial fibrillation with heart rate 90's.   Plan as follows:  Add Toprol XL 25 mg daily  Stop digoxin  Consolidate diltiazem  Convert warfarin to apixaban at patient's request  If recurrent symptoms with rate-control strategy consider AAD therapy with Tikosyn or another agent  [facility]  Sherren Mocha, M.D. 08/02/2016 12:22 PM

## 2016-08-02 NOTE — Discharge Summary (Signed)
Physician Discharge Summary  [name] [id number] DOB: May 25, 1941 DOA: 07/27/2016  PCP: Cathlean Cower, MD  Admit date: 07/27/2016 Discharge date: 08/02/2016  Time spent: 65 minutes  Recommendations for Outpatient Follow-up:  1. Follow-up with Cathlean Cower, MD in 2 weeks. On follow-up patient will need a basic metabolic profile done to follow-up on electrolytes and renal function. 2. Follow-up with cardiology in outpatient setting for outpatient stress test and hospital follow-up.   Discharge Diagnoses:  Principal Problem:   Acute respiratory failure ([place]) Active Problems:   Gold B Copd with acute bronchitis   Long term current use of Coumadin   Atrial fibrillation with rapid ventricular response (HCC)   Combined congestive systolic and diastolic heart failure (HCC)   HLD (hyperlipidemia)   Essential hypertension   GERD (gastroesophageal reflux disease)   Insomnia   Depression with anxiety   New onset atrial fibrillation (HCC)   Respiratory distress   Chronic pain syndrome   Discharge Condition: Stable and improved  Diet recommendation: Heart healthy.  Filed Weights   07/31/16 0510 08/01/16 0433 08/02/16 0422  Weight: 64.1 kg (141 lb 6.4 oz) 63.2 kg (139 lb 4.8 oz) 62.6 kg (138 lb)    History of present illness:  Per Dr Jeri Cos is a 75 y.o. female with medical history significant of clotting disorder on chronic anticoagulation/Coumadin, COPD, esophageal stricture, GERD, hiatal hernia, CVA, HLD, HTN, nephrolithiasis, presented with several week history of progressive shortness of breath. Associated with intermittent bouts of a rapid heartbeat and palpitations. Patient stated she's been "fighting a COPD exacerbation. "Cough is nonproductive but getting worse, more frequent. Patient was placed on a prednisone taper over the last 24 hours without any relief. Patient stated that symptoms became acutely worse the morning of admission, and was unable to ambulate due  to the severe shortness of breath and chest tightness. EMS was called by patient's friend who evaluated patient and stated that heart rate was in the 140s. Diltiazem 20 mg given with improvement of heart rate into the 60s. Breathing treatments given with improvement in respiratory status. Denied fevers, radiation of chest tightness to neck or arm, headache, and neck stiffness, dysuria, frequency, abdominal pain, diarrhea, medication, bright red blood per rectum, melena, hematemesis. On Coumadin for coagulopathy.    ED Course: Objective findings outlined below. In summation patient was started on a diltiazem drip to manage A. fib with RVR, placed on oxygen and given breathing treatments to manage hypoxia.  Hospital Course:  Acute respiratory failurewith hypoxia: -Likely multifactorial including CHF exacerbation and COPD exacerbation and aifb with RVR. Patient with 5 pound weight gain over the last week prior to admission, with no home diuretic.  Patient was admitted placed on a Cardizem drip, IV diuretics, IV Solu-Medrol, doxycycline, scheduled nebulizers, direct Dulera. Patient was initially admitted to the step down unit. Cardiology was consulted and followed the patient throughout the hospitalization. Patient improved clinically was subsequently transitioned to the telemetry floor. IV steroids were tapered down to oral prednisone the patient be discharged on a prednisone taper. Patient finished a course of oral doxycycline during the hospitalization. Patient diuresed well and patient was -4.5 L during this hospitalization. Patient was subsequent transition to oral Lasix. Patient's rate was controlled. Patient underwent cardioversion however patient went back into A. fib. Patient's rate limiting medications were adjusted per cardiology. Digoxin was discontinued patient was placed on Toprol-XL and Cardizem was consolidated. Patient will be discharged home in stable and improved condition and is to  follow-up with cardiology for outpatient stress test as well as PCP.  Atrial fibrillation with RVR:(New diagnoses:CHA2DS2-VASc = 8.) -By patient's description of symptoms most likely in A. fib for some time -On chronic anticoagulation for clotting disorder?.  -Converted to Diltiazem PO 90 mg TID for rate control.  - Cardiology plans on performing DCCV today.  -Per cardiology.  Systolic and Diastolic CHF Clinical improvement. Current weight at 64.1 kg from 64.5 kg from 66 kg on 07/29/2016 from 67.8 kg 07/28/2016 from 67.1 kg 07/27/2016. Patient is -1.84 L over the past 24 hours and -4.847 L over this hospitalization. Patient with a bump in her creatinine which has trended back down with change in Lasix dose of 20 mg daily. Continue digoxin, diltiazem, Avapro.Strict I/Os, daily weights. Per cardiology.      Filed Weights   07/27/16 1029 07/28/16 0500 07/29/16 0400  Weight: 67.1 kg (148 lb) 67.8 kg (149 lb 7.6 oz) 66 kg (145 lb 9.6 oz)   HLD: - continue Zetia  HTN: - See CHF  Chronic pain: - continue flexeril5 mg TID  GERD/esophageal stricture/hiatal hernia: - continue PPI  Depression/Anxiety: - continue celexa20 mg daily - Add ativan PRN as pt very anxious  Insomnia: - continue melatonin  Was admitted -congestion without consolidation suggestive of pneumonia. Last Echo showing EF of [id number] grade 2 diastolic dysfunction.  - Xopenex QID - Continue change IV Solu-Medrol to oral prednisone taper.  - Mucinex - Doxycycline: Complete 5 day course - Titrate O2 to maintain SPO2 89-93%  -Singular 10 mg daily -Dulera 200-5 micrograms BID  Atrial fibrillation with RVR:(New diagnoses:CHA2DS2-VASc = 8.) -By patient's description of symptoms most likely in A. fib for some time. Patient noted to be on chronic anticoagulation prior to admission with Coumadin. Patient was initially placed on a Cardizem drip and subsequently converted to oral Cardizem 90 mg 3 times daily  for rate control. Patient was also maintained on Coumadin for anticoagulation. Patient was followed by cardiology during the hospitalization. Patient underwent cardioversion on 08/01/2016, however unfortunately patient went back into A. fib with controlled ventricular rate. Patient was less symptomatic with no shortness of breath or palpitations. Patient's Norvasc was discontinued and patient's Cardizem was consolidated. Toprol-XL was added to patient's regimen for better rate control. Patient was interested in switching to a NOAC, as such patient was changed to eliquis from Coumadin. Patient will be scheduled for outpatient stress test to assess for underlying coronary disease per cardiology. Patient will follow-up with cardiology in the outpatient setting. Patient will be discharged in stable and improved condition.  Systolic and Diastolic CHF Patient was admitted with acute respiratory failure with hypoxia. Felt to be secondary to acute COPD exacerbation and acute systolic and diastolic heart failure. Patient was placed on IV Lasix as well as Cardizem, digoxin and Avapro. Patient was followed by cardiology during the hospitalization. Patient improved clinically and subsequently transitioned to oral Lasix. Patient be discharged home in stable and improved condition on oral Lasix, metoprolol, Cardizem, Zetia, Avapro. Patient be scheduled for outpatient stress test. Patient will follow-up with cardiology in the outpatient setting.                    HLD: - continued on Zetia.  HTN: - See CHF  Chronic pain: - continued on flexeril5 mg TID  GERD/esophageal stricture/hiatal hernia: - continued on PPI  Depression/Anxiety: - continued on home regimen of celexa20 mg daily - Added ativan PRN as pt very anxious.  Insomnia: - continued on  melatonin. Outpatient follow up.    Procedures:  Chest x-ray 07/27/2016, 07/28/2016  Cardioversion 08/01/2016--- Dr  Marlou Porch.  Consultations:  Cardiology: Dr. Ellyn Hack 07/27/2016  Discharge Exam: Vitals:   08/02/16 1052 08/02/16 1540  BP: 119/68 120/79  Pulse: 63 90  Resp:    Temp:      General: NAD Cardiovascular: RRR Respiratory: CTAB  Discharge Instructions   Discharge Instructions    Diet - low sodium heart healthy    Complete by:  As directed   Increase activity slowly    Complete by:  As directed     Current Discharge Medication List    START taking these medications   Details  !! apixaban (ELIQUIS) 5 MG TABS tablet Take 1 tablet (5 mg total) by mouth 2 (two) times daily. Qty: 60 tablet, Refills: 0    !! apixaban (ELIQUIS) 5 MG TABS tablet Take 1 tablet (5 mg total) by mouth 2 (two) times daily. Qty: 180 tablet, Refills: 0    !! diltiazem (CARDIZEM CD) 300 MG 24 hr capsule Take 1 capsule (300 mg total) by mouth daily. Qty: 90 capsule, Refills: 0    !! diltiazem (CARDIZEM CD) 300 MG 24 hr capsule Take 1 capsule (300 mg total) by mouth daily. Qty: 30 capsule, Refills: 0    !! furosemide (LASIX) 20 MG tablet Take 1 tablet (20 mg total) by mouth daily. Qty: 90 tablet, Refills: 0    !! furosemide (LASIX) 20 MG tablet Take 1 tablet (20 mg total) by mouth daily. Qty: 30 tablet, Refills: 0    LORazepam (ATIVAN) 0.5 MG tablet Take 1 tablet (0.5 mg total) by mouth 2 (two) times daily as needed for anxiety. Qty: 15 tablet, Refills: 0    !! metoprolol succinate (TOPROL XL) 25 MG 24 hr tablet Take 1 tablet (25 mg total) by mouth daily. Qty: 30 tablet, Refills: 0    !! metoprolol succinate (TOPROL-XL) 25 MG 24 hr tablet Take 1 tablet (25 mg total) by mouth daily. Qty: 90 tablet, Refills: 0     !! - Potential duplicate medications found. Please discuss with provider.    CONTINUE these medications which have CHANGED   Details  predniSONE (DELTASONE) 20 MG tablet Take 1-2 tablets (20-40 mg total) by mouth daily before breakfast. Take 2 tablets (40mg ) daily x 3 days, then 1 tablet  daily x 3 days then stop. Qty: 9 tablet, Refills: 0      CONTINUE these medications which have NOT CHANGED   Details  albuterol (PROVENTIL HFA;VENTOLIN HFA) 108 (90 BASE) MCG/ACT inhaler Inhale 1-2 puffs into the lungs every 6 (six) hours as needed for wheezing or shortness of breath. Qty: 3 Inhaler, Refills: 4    azelastine (ASTELIN) 0.1 % nasal spray Place 2 sprays into the nose daily. Use in each nostril as directed Qty: 90 mL, Refills: 1    benzonatate (TESSALON) 100 MG capsule Take 1 capsule (100 mg total) by mouth 3 (three) times daily as needed for cough. Qty: 90 capsule, Refills: 0    budesonide-formoterol (SYMBICORT) 160-4.5 MCG/ACT inhaler Inhale 2 puffs into the lungs 2 (two) times daily. Qty: 3 Inhaler, Refills: 4    Calcium Carbonate-Vitamin D (CALCIUM + D PO) Take 1 tablet by mouth 2 (two) times daily.     citalopram (CELEXA) 20 MG tablet Take 1 tablet (20 mg total) by mouth daily. Qty: 90 tablet, Refills: 3    cyclobenzaprine (FLEXERIL) 5 MG tablet Take 1 tablet (5 mg total) by mouth 3 (  three) times daily as needed for muscle spasms. Qty: 270 tablet, Refills: 0    ezetimibe (ZETIA) 10 MG tablet Take 1 tablet (10 mg total) by mouth daily. Qty: 90 tablet, Refills: 3    fluticasone (FLONASE) 50 MCG/ACT nasal spray Place 1 spray into both nostrils 2 (two) times daily. Qty: 48 g, Refills: 1    irbesartan (AVAPRO) 150 MG tablet Take 1 tablet (150 mg total) by mouth every morning. Qty: 30 tablet, Refills: 0    Lidocaine 2 % GEL Apply 1 application topically daily as needed (SHINGLES).    Magnesium 250 MG TABS Take 1 tablet by mouth daily as needed. Leg cramps    Melatonin 1 MG TABS Take 3 mg by mouth at bedtime.     meloxicam (MOBIC) 15 MG tablet Take 15 mg by mouth daily.    mirabegron ER (MYRBETRIQ) 50 MG TB24 tablet Take 50 mg by mouth daily.    montelukast (SINGULAIR) 10 MG tablet Take 10 mg by mouth daily.    Multiple Vitamin (MULTIVITAMIN) tablet Take 1  tablet by mouth daily.      omeprazole (PRILOSEC) 40 MG capsule Take 1 capsule (40 mg total) by mouth daily. Qty: 90 capsule, Refills: 3   Associated Diagnoses: Dysphagia; Esophageal stricture; Gastroesophageal reflux disease with esophagitis    Respiratory Therapy Supplies (FLUTTER) DEVI Use 2-3 times per day Qty: 1 each, Refills: 0    Spacer/Aero-Holding Chambers (AEROCHAMBER Z-STAT PLUS) inhaler Use as instructed Qty: 1 each, Refills: 0    Tiotropium Bromide Monohydrate (SPIRIVA RESPIMAT) 2.5 MCG/ACT AERS Inhale 2 puffs into the lungs daily. Qty: 3 Inhaler, Refills: 3      STOP taking these medications     amLODipine (NORVASC) 5 MG tablet      warfarin (COUMADIN) 5 MG tablet        Allergies  Allergen Reactions  . Clarithromycin Other (See Comments)    REACTION: possible rash but could have been legionarres dz with rash  . Lipitor [Atorvastatin] Other (See Comments)    MUSCLE SPASMS  . Simvastatin Other (See Comments)    Muscle and joint pain  . Crestor [Rosuvastatin Calcium]     fagitue and joint pain, "NO STATINS"   Follow-up Information    CARROLL,DONNA, NP Follow up on 08/09/2016.   Specialties:  Nurse Practitioner, Cardiology Why:  1:30 PM ([place]). call if you need help with dirrections  Contact information: [place] [address] [phone]        [facility] [address]   Specialty:  Cardiology Why:  our office will call you with an appointment for a stress test in our office  Contact information: [address] [place] [id number] [place] [id number] [phone]       Cathlean Cower, MD. Schedule an appointment as soon as possible for a visit in 2 week(s).   Specialties:  Internal Medicine, Radiology Contact information: [place] [address] [phone]            The results of significant diagnostics from this hospitalization (including imaging,  microbiology, ancillary and laboratory) are listed below for reference.    Significant Diagnostic Studies: Dg Chest 2 View  Result Date: 07/28/2016 CLINICAL DATA:  Congestive heart failure. Shortness of breath for 3 weeks EXAM: CHEST  2 VIEW COMPARISON:  July 27, 2016 FINDINGS: There remains generalized interstitial prominence without airspace consolidation. There are bilateral pleural effusions currently with cardiomegaly and  pulmonary venous hypertension. No adenopathy. There is atherosclerotic calcification in the aorta. There is postoperative change in the upper lumbar spine. Bones appear osteoporotic. IMPRESSION: Evidence of a degree of congestive heart failure superimposed on interstitial fibrosis. No airspace consolidation. There is aortic atherosclerosis. Bones appear osteoporotic. Electronically Signed   By: Lowella Grip III M.D.   On: 07/28/2016 07:17   Dg Chest Portable 1 View  Result Date: 07/27/2016 CLINICAL DATA:  Shortness of Breath EXAM: PORTABLE CHEST 1 VIEW COMPARISON:  May 25, 2016 and March 22, 2016 FINDINGS: There is generalized interstitial prominence, likely due to underlying pulmonary fibrotic type change. There is no frank edema or consolidation. Heart is upper normal in size with pulmonary vascularity indicating a mild degree of pulmonary venous hypertension. No adenopathy. Hiatal hernia evident. There is atherosclerotic calcification in the aorta. No bone lesions. IMPRESSION: Chronic interstitial prominence, likely due to chronic fibrotic type change. A degree of mild superimposed interstitial edema cannot be entirely excluded. There is evidence suggesting a degree of underlying pulmonary vascular congestion. No airspace consolidation. There is a hiatal hernia. There is aortic atherosclerosis. Electronically Signed   By: Lowella Grip III M.D.   On: 07/27/2016 10:25    Microbiology: Recent Results (from the past 240 hour(s))  MRSA PCR Screening     Status: None    Collection Time: 07/27/16  6:28 PM  Result Value Ref Range Status   MRSA by PCR NEGATIVE NEGATIVE Final    Comment:        The GeneXpert MRSA Assay (FDA approved for NASAL specimens only), is one component of a comprehensive MRSA colonization surveillance program. It is not intended to diagnose MRSA infection nor to guide or monitor treatment for MRSA infections.      Labs: Basic Metabolic Panel:  Recent Labs Lab 07/27/16 [id number]  07/27/16 1148 07/28/16 0421 07/30/16 0303 07/31/16 0418 07/31/16 0842 08/01/16 0258 08/02/16 0305  NA 139  < >  --  139 140 141  --  138 138  K 4.1  < >  --  4.0 3.6 3.5  --  4.1 3.8  CL 109  < >  --  108 102 103  --  101 101  CO2 22  --   --  23 30 29   --  26 27  GLUCOSE 130*  < >  --  144* 141* 135*  --  144* 106*  BUN 40*  < >  --  28* 36* 36*  --  35* 42*  CREATININE 0.98  < >  --  0.98 1.29* 0.92  --  0.98 0.98  CALCIUM 9.1  --   --  8.9 9.5 9.3  --  9.3 9.1  MG 2.1  --  2.8*  --   --   --  2.3  --   --   PHOS  --   --  2.7  --   --   --   --   --   --   < > = values in this interval not displayed. Liver Function Tests:  Recent Labs Lab 07/27/16 0952 07/28/16 0421  AST 40 25  ALT 42 33  ALKPHOS 72 63  BILITOT 0.6 0.5  PROT 6.0* 5.7*  ALBUMIN 3.8 3.5   No results for input(s): LIPASE, AMYLASE in the last 168 hours. No results for input(s): AMMONIA in the last 168 hours. CBC:  Recent Labs Lab 07/27/16 0948 07/27/16 1013 07/28/16 0421 07/30/16 0303  WBC 11.9*  --  5.6 6.6  HGB 12.1 12.9 11.0* 12.1  HCT 38.1 38.0 35.4* 38.3  MCV 81.6  --  81.6 81.3  PLT 240  --  210 224   Cardiac Enzymes:  Recent Labs Lab 07/27/16 1148 07/27/16 1441 07/27/16 1741  TROPONINI <0.03 <0.03 <0.03   BNP: BNP (last 3 results)  Recent Labs  07/27/16 0952  BNP 723.5*    ProBNP (last 3 results) No results for input(s): PROBNP in the last 8760 hours.  CBG: No results for input(s): GLUCAP in the last 168  hours.     SignedIrine Seal MD.  [facility] 08/02/2016, 4:12 PM

## 2016-08-02 NOTE — Consult Note (Signed)
   [facility] CM Inpatient Consult   08/02/2016  ROBBY JIWANI [birth year] [id number]  Late entry 1210 pm:  Patient evaluated for community based chronic disease management services with [facility] as a benefit of patient's Medicare Insurance.  Patient was admitted with persistent atrial fibrillation with HX of HF and COPD.   Spoke with patient and husband, Shanon Brow, at bedside to explain [facility] services. Patient endorses Dr. Cathlean Cower as her primary care provider.  Consent form signed and package given with contact information.   Patient will receive post hospital discharge call and will be evaluated for monthly home visits for assessments and disease process education.  Left contact information and [facility] literature at bedside. Made Inpatient Case Manager aware that [place] Management following. Of note, [facility] Care Management services does not replace or interfere with any services that are arranged by inpatient case management or social work.  For additional questions or referrals please contact:    Natividad Brood, RN BSN [facility] Liaison  [phone] business mobile phone Toll free office [phone]

## 2016-08-02 NOTE — Progress Notes (Addendum)
ANTICOAGULATION CONSULT NOTE - Follow Up Consult  Pharmacy Consult for Eliquis Indication: atrial fibrillation and stroke  Allergies  Allergen Reactions  . Clarithromycin Other (See Comments)    REACTION: possible rash but could have been legionarres dz with rash  . Lipitor [Atorvastatin] Other (See Comments)    MUSCLE SPASMS  . Simvastatin Other (See Comments)    Muscle and joint pain  . Crestor [Rosuvastatin Calcium]     fagitue and joint pain, "NO STATINS"    Patient Measurements: Height: 5\' 4"  (162.6 cm) Weight: 138 lb (62.6 kg) IBW/kg (Calculated) : 54.7  Vital Signs: Temp: 97.6 F (36.4 C) (08/29 0422) Temp Source: Oral (08/29 0422) BP: 134/89 (08/29 0422) Pulse Rate: 90 (08/29 0422)  Labs:  Recent Labs  07/31/16 0418 08/01/16 0258 08/02/16 0305  LABPROT 34.2* 27.5* 23.0*  INR 3.29 2.50 2.00  CREATININE 0.92 0.98 0.98    Estimated Creatinine Clearance: 42.2 mL/min (by C-G formula based on SCr of 0.98 mg/dL).  Assessment: 67 yof on chronic Coumadin 5mg  daily exc for 7.5mg  on MWF for afib, hx of CVA, last dose taken 8/23 when INR was 2.9. Continued to trend up and peaked at 4.55. Now INR back down to 2.0 after restarting lower dose of Coumadin two days ago. Cardioverted on 8/28. Hgb and plts stable. Just finished course of doxycycline.  Goal of Therapy:  INR 2-3 Monitor platelets by anticoagulation protocol: Yes   Plan:  Give Coumadin 7.5mg  PO x 1 Monitor daily INR, CBC, s/s of bleed  Elenor Quinones, PharmD, BCPS Clinical Pharmacist Pager [phone] 08/02/2016 8:35 AM   ADDENDUM:  Cards now transitioning to Eliquis for Afib per patient request. Since INR trending down and borderline low at 2.0 will stop Coumadin and start Eliquis tonight. Had restarted Coumadin over past couple days, but received lower doses so would not expect INR to jump back up much more than it already is. Hgb and plts stable. Age, weight, and SCr all ok for 5mg   dose  Plan: Stop Coumadin Start Eliquis 5mg  PO BID Monitor INR tomorrow to make sure continues to trend down, CBC, s/s of bleed  Elenor Quinones, PharmD, [facility] Clinical Pharmacist Pager [phone] 08/02/2016 12:37 PM

## 2016-08-02 NOTE — Progress Notes (Signed)
Order for nuclear stress test has been placed. Our office will call patient with appt.

## 2016-08-02 NOTE — Discharge Instructions (Signed)

## 2016-08-02 NOTE — Progress Notes (Signed)
Insurance check for [employer] @ [place] # [phone]    1. ELIQUIS   5 MG BID  FOR (30 ) AND (90 )   COVER- CO-PAY- 25 % OF COAST  PRIOR APPROVAL - NO  PHARMACY : ANY RETAIL   MAIL -ORDER FOR 90 DAY SUPPLY- FREE

## 2016-08-03 NOTE — CV Procedure (Signed)
    Electrical Cardioversion Procedure Note [name] [id number] [birth year]  Procedure: Electrical Cardioversion Indications:  Atrial Fibrillation  Time Out: Verified patient identification, verified procedure,medications/allergies/relevent history reviewed, required imaging and test results available.  Performed  Procedure Details  The patient was NPO after midnight. Anesthesia was administered at the beside  by anesthesia with propofol.  Cardioversion was performed with synchronized biphasic defibrillation via AP pads with 120 joules.  1 attempt(s) were performed.  The patient converted to normal sinus rhythm. The patient tolerated the procedure well   IMPRESSION:  Successful cardioversion of atrial fibrillation    Candee Furbish 08/03/2016, 5:48 PM

## 2016-08-04 ENCOUNTER — Other Ambulatory Visit: Payer: Self-pay

## 2016-08-04 NOTE — Patient Outreach (Signed)
Transition of care: Vitals:   08/04/16 1648  Weight: 138 lb 14.4 oz (63 kg)   Patient reports that she is doing well. States that this hospital admission was a scary event for her. Reports that her husband drives her to her appointments. States that she did not know to call and make an appointment with primary MD. I offered to call and she would like to do this herself.  Patient unaware about orders for home health nurse.  I clarified with hospital Liaison that no home health was ordered.  Patient reports that she will have cardiology follow up on 08/09/2016.   Patient reports that she now has a pulse ox and she is monitoring her heart rate and her oxygen level. Reports heart rate between 50-80 bpm. States pulse ox reading of 94-95%.  Patient reports that she weighs daily. Reviewed heart failure zones with patient and when to call MD.  Patient reports that she is following her low salt diet. Reports that she is using Ms. Dash.    Patient reports that she has all her medications and uses the [place] mail order for these.  Reviewed transition of care program with patient and offered home visit for 08/16/2016 and patient has accepted. Confirmed address and provided my contact information. Also reviewed 24 hour nurse line with patient and she reports that she has magnet.  THN CM Care Plan Problem One   Flowsheet Row Most Recent Value  Care Plan Problem One  Recent admission for atrial fibrillation and heart failure  Role Documenting the Problem One  Care Management [place] for Problem One  Active  THN Long Term Goal (31-90 days)  Patient will report no readmissions in the next 31 days.  THN Long Term Goal Start Date  08/04/16  Interventions for Problem One Long Term Goal  Reviewed transition of care program. Provided 24 hour nurse line number and my contact information.  THN CM Short Term Goal #1 (0-30 days)  Patient will report having a follow up appointment with primary MD in the next 2  weeks.  THN CM Short Term Goal #1 Start Date  08/04/16  Interventions for Short Term Goal #1  Offered  to make appointment and patient declined. Discussed importance of timely follow up.  THN CM Short Term Goal #2 (0-30 days)  Patient will report weighing daily for the next 30 days.  THN CM Short Term Goal #2 Start Date  08/04/16  Interventions for Short Term Goal #2  reviewed heart failure zones, reviewed parameters to call MD.  Reviewed with patient how to weigh and when to weigh.  THN CM Short Term Goal #3 (0-30 days)  Patient will follow her low salt diet for 30 days.  THN CM Short Term Goal #3 Start Date  08/04/16  Interventions for Short Tern Goal #3  Reviewed low salt foods, encouraged salt substitute.      PLAN: Telephone transition of care call in 1 week.             Home visit planned for 08/16/2016. Will send MD letter on involvement and this note.   Tomasa Rand, RN, BSN, CEN [facility] [employer] [phone]

## 2016-08-05 ENCOUNTER — Other Ambulatory Visit: Payer: Self-pay | Admitting: *Deleted

## 2016-08-05 ENCOUNTER — Encounter: Payer: Self-pay | Admitting: Cardiology

## 2016-08-05 ENCOUNTER — Telehealth: Payer: Self-pay | Admitting: Internal Medicine

## 2016-08-05 MED ORDER — METOPROLOL SUCCINATE ER 25 MG PO TB24: 25.0000 mg | ORAL_TABLET | Freq: Every day | ORAL | 0 refills | Status: DC

## 2016-08-05 MED ORDER — METOPROLOL SUCCINATE ER 25 MG PO TB24
25.0000 mg | ORAL_TABLET | Freq: Every day | ORAL | 0 refills | Status: DC
Start: 2016-08-05 — End: 2016-08-05

## 2016-08-05 NOTE — Telephone Encounter (Signed)
New message      Pt calling stating that she was released from the hospital 2 days ago and failed to get the rx Metoprolol 2nd 8 25 mg. Please write a rx. Please send to [facility] in [place] by mail electronically. Please call.

## 2016-08-09 ENCOUNTER — Encounter: Payer: Self-pay | Admitting: Pulmonary Disease

## 2016-08-09 ENCOUNTER — Encounter (HOSPITAL_COMMUNITY): Payer: Self-pay | Admitting: Nurse Practitioner

## 2016-08-09 ENCOUNTER — Ambulatory Visit (HOSPITAL_COMMUNITY)
Admit: 2016-08-09 | Discharge: 2016-08-09 | Disposition: A | Discharge status: Home / Self Care | Payer: Medicare Other | Attending: Nurse Practitioner | Admitting: Nurse Practitioner

## 2016-08-09 ENCOUNTER — Telehealth: Payer: Self-pay | Admitting: Pulmonary Disease

## 2016-08-09 VITALS — BP 126/64 | HR 68 | Ht 64.0 in | Wt 147.0 lb

## 2016-08-09 DIAGNOSIS — R042 Hemoptysis: Secondary | ICD-10-CM | POA: Insufficient documentation

## 2016-08-09 DIAGNOSIS — Z7901 Long term (current) use of anticoagulants: Secondary | ICD-10-CM | POA: Diagnosis not present

## 2016-08-09 DIAGNOSIS — I10 Essential (primary) hypertension: Secondary | ICD-10-CM | POA: Diagnosis not present

## 2016-08-09 DIAGNOSIS — Z79899 Other long term (current) drug therapy: Secondary | ICD-10-CM | POA: Diagnosis not present

## 2016-08-09 DIAGNOSIS — I4819 Other persistent atrial fibrillation: Secondary | ICD-10-CM

## 2016-08-09 DIAGNOSIS — I4891 Unspecified atrial fibrillation: Secondary | ICD-10-CM

## 2016-08-09 DIAGNOSIS — K219 Gastro-esophageal reflux disease without esophagitis: Secondary | ICD-10-CM | POA: Insufficient documentation

## 2016-08-09 DIAGNOSIS — Z888 Allergy status to other drugs, medicaments and biological substances status: Secondary | ICD-10-CM | POA: Insufficient documentation

## 2016-08-09 DIAGNOSIS — Z9889 Other specified postprocedural states: Secondary | ICD-10-CM | POA: Insufficient documentation

## 2016-08-09 DIAGNOSIS — Z87891 Personal history of nicotine dependence: Secondary | ICD-10-CM | POA: Diagnosis not present

## 2016-08-09 DIAGNOSIS — I481 Persistent atrial fibrillation: Secondary | ICD-10-CM

## 2016-08-09 DIAGNOSIS — I509 Heart failure, unspecified: Secondary | ICD-10-CM | POA: Diagnosis not present

## 2016-08-09 DIAGNOSIS — K449 Diaphragmatic hernia without obstruction or gangrene: Secondary | ICD-10-CM | POA: Diagnosis not present

## 2016-08-09 DIAGNOSIS — J441 Chronic obstructive pulmonary disease with (acute) exacerbation: Secondary | ICD-10-CM | POA: Diagnosis not present

## 2016-08-09 LAB — BASIC METABOLIC PANEL
Anion gap: 6 (ref 5–15)
BUN: 44 mg/dL — ABNORMAL HIGH (ref 6–20)
CO2: 28 mmol/L (ref 22–32)
Calcium: 8.9 mg/dL (ref 8.9–10.3)
Chloride: 104 mmol/L (ref 101–111)
Creatinine, Ser: 0.99 mg/dL (ref 0.44–1.00)
GFR calc Af Amer: >60 mL/min (ref >60)
GFR calc non Af Amer: 54 mL/min — ABNORMAL LOW (ref >60)
Glucose, Bld: 92 mg/dL (ref 65–99)
Potassium: 4.1 mmol/L (ref 3.5–5.1)
Sodium: 138 mmol/L (ref 135–145)

## 2016-08-09 NOTE — Progress Notes (Addendum)
Primary Care Physician: Cathlean Cower, MD Referring Physician: [facility] f/u Cardiologist: Dr. Philemon Kingdom is a 75 y.o. 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 Associated with intermittent bouts of a rapid heartbeat and palpitations since rt hip gluteus medius tendon tear surgery and rehab, April of this year. Patient felt like most recently was experiencing a COPD exacerbation associated with cough, nonproductive, but progressively worsening. Patient was placed on a prednisone taper over the last 24 hours prior to d/c, without any relief. Patient stated that symptoms became acutely worse the morning of admission, and was unable to ambulate due to the severe shortness of breath and chest tightness. EMS was called by patient's friend who evaluated patient and stated that heart rate was in the 140s. Diltiazem 20 mg given with improvement of heart rate into the 60s.   Pt was dx with new onset afib with RVR, but by history, had been having issues for months with intermittent high heart beat along with CHF exacerbation and COPD exacerbation . She had a 5 lb weight gain week prior to admission. She lost 4.5 L during hospitalalization and transitioned to oral lasix. She was d/ced in afib  on rate control. She did have successful cardioversion but returned to afib within a few hours after procedure. She was on coumadin for prior clotting disorder, but was discharged on eliquis.  She presents to the afib clinic for 9/5 for hospital f/u. She states that she finished prednisone taper yesterday, has noticed 3-5 lb weight gain over the last week-end. No PND/orthopnea. She is in rate controlled afib today. She is not bothered by rapid heartbeat at this time. No unusual shortness of breath. Has coughed up a few dark blood clots  and is pending appointment with her pulmonologist tomorrow, after she reported this finding. She is trying to avoid salt but appetite was increased over the last few days due to prednisone. She has been ordered a stress test per hospital d/c summary but this has not been scheduled. She was not given f/u with general cardiology, Dr. Harrington Challenger is pt's cardiologist..  Today, she denies symptoms of palpitations, chest pain, shortness of breath, orthopnea, PND,  dizziness, presyncope, syncope, or neurologic sequela.  Positive for weight gain and worsening LLE. Positive for coughing up dark blood clots, pending appointment with pulmonologist 9/6.The patient is tolerating medications without difficulties and is otherwise without complaint today.   Past Medical History:  Diagnosis Date  . Allergic rhinitis   . Allergy   . Anxiety   . Basal cell carcinoma   . Cataract    bil cataracts removed  . COPD (chronic obstructive pulmonary disease) ([place])   . Depression 11/29/2013  . DI (detrusor instability)   . Dysrhythmia   . Esophageal stricture   . GERD (gastroesophageal reflux disease)   . Hemorrhoids   . Hiatal hernia   . History of cerebrovascular accident   . History of kidney stones   . HLD (hyperlipidemia)   . HTN (hypertension)   . Nephrolithiasis    hx  . Osteoarthritis   . Pneumonia 2013  . Shingles since Nov 18, 2013   on right arm from shoulder to wrist  . Shortness of breath dyspnea    WITH EXERTION   . Status post dilation of esophageal narrowing   . Stroke [facility]) 2001  STROKES, TIA'S  . TIA (transient ischemic attack) last 2001   anticoagulation therapy on coumadin   Past Surgical History:  Procedure Laterality Date  . ANTERIOR AND POSTERIOR VAGINAL REPAIR  2008   A&P REPAIR-DR MCDIARMID  . ANTERIOR LATERAL LUMBAR FUSION 4 LEVELS Right 07/03/2015   Procedure: Right Lumbar one-two, Lumbar two-three, Lumbar three-four, Lumbar four-five Anterior lateral lumbar interbody fusion;   Surgeon: Erline Levine, MD;  Location: [place] NEURO ORS;  Service: Neurosurgery;  Laterality: Right;  Right L1-2 L2-3 L3-4 L4-5 Anterior lateral lumbar interbody fusion  . BALLOON DILATION N/A 03/03/2014   Procedure: BALLOON DILATION;  Surgeon: Inda Castle, MD;  Location: [facility] ENDOSCOPY;  Service: Endoscopy;  Laterality: N/A;  . BLADDER SUSPENSION    . CARDIOVERSION N/A 08/01/2016   Procedure: CARDIOVERSION;  Surgeon: Jerline Pain, MD;  Location: [facility] ENDOSCOPY;  Service: Cardiovascular;  Laterality: N/A;  . COLONOSCOPY    . ESOPHAGOGASTRODUODENOSCOPY N/A 03/03/2014   Procedure: ESOPHAGOGASTRODUODENOSCOPY (EGD);  Surgeon: Inda Castle, MD;  Location: [facility] ENDOSCOPY;  Service: Endoscopy;  Laterality: N/A;  . EXCISION OF BASAL CELL CA  2011  . GLUTEUS MINIMUS REPAIR Right 03/15/2016   Procedure: RIGHT HIP GLUTEUS MEDIUS TENDON REPAIR;  Surgeon: Paralee Cancel, MD;  Location: [facility] ORS;  Service: Orthopedics;  Laterality: Right;  . LUMBAR PERCUTANEOUS PEDICLE SCREW 4 LEVEL Right 07/03/2015   Procedure: Lumbar one-Sacral one Bilateral percutaneous pedicle screws;  Surgeon: Erline Levine, MD;  Location: [place] NEURO ORS;  Service: Neurosurgery;  Laterality: Right;  L1-S1 Bilateral percutaneous pedicle screws  . RECTOCELE REPAIR  2008   A&P REPAIR -DR. MCDIARMID  . THUMB SURGERY Right 10-15 yrs ago  . UPPER GASTROINTESTINAL ENDOSCOPY    . VAGINAL HYSTERECTOMY  1983   NO BSO-DR. MABRY  . VARICOSE VEIN SURGERY      Current Outpatient Prescriptions  Medication Sig Dispense Refill  . albuterol (PROVENTIL HFA;VENTOLIN HFA) 108 (90 BASE) MCG/ACT inhaler Inhale 1-2 puffs into the lungs every 6 (six) hours as needed for wheezing or shortness of breath. 3 Inhaler 4  . apixaban (ELIQUIS) 5 MG TABS tablet Take 1 tablet (5 mg total) by mouth 2 (two) times daily. 60 tablet 0  . apixaban (ELIQUIS) 5 MG TABS tablet Take 1 tablet (5 mg total) by mouth 2 (two) times daily. 180 tablet 0  . azelastine (ASTELIN) 0.1 % nasal spray  Place 2 sprays into the nose daily. Use in each nostril as directed 90 mL 1  . budesonide-formoterol (SYMBICORT) 160-4.5 MCG/ACT inhaler Inhale 2 puffs into the lungs 2 (two) times daily. 3 Inhaler 4  . Calcium Carbonate-Vitamin D (CALCIUM + D PO) Take 1 tablet by mouth 2 (two) times daily.     . citalopram (CELEXA) 20 MG tablet Take 1 tablet (20 mg total) by mouth daily. 90 tablet 3  . diltiazem (CARDIZEM CD) 300 MG 24 hr capsule Take 1 capsule (300 mg total) by mouth daily. 90 capsule 0  . ezetimibe (ZETIA) 10 MG tablet Take 1 tablet (10 mg total) by mouth daily. 90 tablet 3  . fluticasone (FLONASE) 50 MCG/ACT nasal spray Place 1 spray into both nostrils 2 (two) times daily. 48 g 1  . furosemide (LASIX) 20 MG tablet Take 1 tablet (20 mg total) by mouth daily. 30 tablet 0  . irbesartan (AVAPRO) 150 MG tablet Take 1 tablet (150 mg total) by mouth every morning. 30 tablet 0  . LORazepam (ATIVAN) 0.5 MG tablet Take 1 tablet (0.5 mg total) by mouth  2 (two) times daily as needed for anxiety. 15 tablet 0  . Magnesium 250 MG TABS Take 1 tablet by mouth daily as needed. Leg cramps    . Melatonin 1 MG TABS Take 3 mg by mouth at bedtime.     . meloxicam (MOBIC) 15 MG tablet Take 15 mg by mouth daily.    . metoprolol succinate (TOPROL XL) 25 MG 24 hr tablet Take 1 tablet (25 mg total) by mouth daily. 30 tablet 0  . mirabegron ER (MYRBETRIQ) 50 MG TB24 tablet Take 50 mg by mouth daily.    . montelukast (SINGULAIR) 10 MG tablet Take 10 mg by mouth daily.    . Multiple Vitamin (MULTIVITAMIN) tablet Take 1 tablet by mouth daily.      Marland Kitchen omeprazole (PRILOSEC) 40 MG capsule Take 1 capsule (40 mg total) by mouth daily. 90 capsule 3  . Respiratory Therapy Supplies (FLUTTER) DEVI Use 2-3 times per day 1 each 0  . Spacer/Aero-Holding Chambers (AEROCHAMBER Z-STAT PLUS) inhaler Use as instructed 1 each 0  . Tiotropium Bromide Monohydrate (SPIRIVA RESPIMAT) 2.5 MCG/ACT AERS Inhale 2 puffs into the lungs daily. 3 Inhaler  3  . vitamin E 400 UNIT capsule Take 400 Units by mouth daily.    . benzonatate (TESSALON) 100 MG capsule Take 1 capsule (100 mg total) by mouth 3 (three) times daily as needed for cough. (Patient not taking: Reported on 08/09/2016) 90 capsule 0  . cyclobenzaprine (FLEXERIL) 5 MG tablet Take 1 tablet (5 mg total) by mouth 3 (three) times daily as needed for muscle spasms. (Patient not taking: Reported on 08/09/2016) 270 tablet 0  . furosemide (LASIX) 20 MG tablet Take 1 tablet (20 mg total) by mouth daily. 90 tablet 0   No current facility-administered medications for this encounter.     Allergies  Allergen Reactions  . Clarithromycin Other (See Comments)    REACTION: possible rash but could have been legionarres dz with rash  . Lipitor [Atorvastatin] Other (See Comments)    MUSCLE SPASMS  . Simvastatin Other (See Comments)    Muscle and joint pain  . Crestor [Rosuvastatin Calcium]     fagitue and joint pain, "NO STATINS"    Social History   Social History  . Marital status: Married    Spouse name: N/A  . Number of children: 2  . Years of education: N/A   Occupational History  . retired Retired   Social History Main Topics  . Smoking status: Former Smoker    Packs/day: 3.00    Years: 42.00    Types: Cigarettes    Start date: 08/04/1954    Quit date: 12/06/1995  . Smokeless tobacco: Never Used  . Alcohol use 0.0 oz/week     Comment: occas very little  . Drug use: No  . Sexual activity: Not Currently    Birth control/ protection: Surgical     Comment: HYST-1st intercourse 42 yo-2 partners   Other Topics Concern  . Not on file   Social History Narrative   Retired- Dentist Pulmonary:   Originally from [place]. Has always lived in [place]. She has traveled to [place], [place], [place], [place], [place], [place]. Has been on multiple cruises. 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 She did use a face mask consistently to avoid chemical fume exposure. She may have only had an inhaled exposure a couple of times.  She currently has a dog. Remote exposure to a parakeet. No mold or hot tub exposure. No asbestos exposure.     Family History  Problem Relation Age of Onset  . COPD Mother   . Breast cancer Mother     mid 26's  . Uterine cancer Mother   . Emphysema Mother   . Breast cancer Maternal Aunt     x 3 aunts, post menopausal  . Colon cancer Neg Hx   . Esophageal cancer Neg Hx   . Rectal cancer Neg Hx   . Stomach cancer Neg Hx   . Pancreatic cancer Neg Hx     ROS- All systems are reviewed and negative except as per the HPI above  Physical Exam: Vitals:   08/09/16 1323  BP: 126/64  Pulse: 68  Weight: 147 lb (66.7 kg)  Height: 5\' 4"  (1.626 m)    GEN- The patient is well appearing, alert and oriented x 3 today.   Head- normocephalic, atraumatic Eyes-  Sclera clear, conjunctiva pink Ears- hearing intact Oropharynx- clear Neck- supple, no JVP Lymph- no cervical lymphadenopathy Lungs- Clear to ausculation bilaterally, normal work of breathing Heart-irregular rate and rhythm, no murmurs, rubs or gallops, PMI not laterally displaced GI- soft, NT, ND, + BS Extremities- no clubbing, cyanosis ,2+ edema LE's MS- no significant deformity or atrophy Skin- no rash or lesion Psych- euthymic mood, full affect Neuro- strength and sensation are intact  EKG-afib  with v rate of 65 bpm, qrs int 94 bpm, qtc 423 ms Epic records reviewed ECHO- - Left ventricle: The cavity size was at the upper limits of   normal. Wall thickness was normal. Systolic function was mildly   reduced. The estimated ejection fraction was in the range of 45%   to 50%. Diffuse hypokinesis. Features are consistent with a   pseudonormal left ventricular filling pattern, with concomitant   abnormal relaxation and increased filling pressure (grade 2   diastolic dysfunction). -  Ventricular septum: Septal motion showed paradox. These changes   are consistent with intraventricular conduction delay. - Mitral valve: There was moderate regurgitation directed   centrally. - Left atrium: The atrium was moderately to severely dilated, 48 mm. - Atrial septum: The septum bowed from left to right, consistent   with increased left atrial pressure. No defect or patent foramen   ovale was identified.  Impressions:  - Incessant PVCs seen throughout the study.   ASSESSMENT/PLAN: 1. New onset symptomatic afib Failed cardioversion D/c in rate controlled afib  Continue diltiazem 300 mg daily, continue metoprolol ER 25 mg daily Continue apixaban 5 mg bid(previously on coumadin) chadsvasc score of at least 6 Right now pt does not appear symptomatic with afib, when condition more stabilized, consider restoring SR with antiarrythmic  2. CHF C/o increase LLE and weight gain of 3-4 lbs over weekend Increase lasix to 40 mg x 3 days Daily weights Avoid salt Elevate legs when sitting Cardizem may be contributing to LLE and if should continue, may need to decrease cardizem and increase BB Pending outpt stress test bmet    3. COPD exacerbation Improved since d/c Finished prednisone yesterday Coughing up blood clots Pending  f/u with pulmonology tomorrow  Requested f/u with general cardiology, was not scheduled at time of d/c.  Geroge Baseman Carroll, [facility] [address] [place], [place] [address] [phone]

## 2016-08-09 NOTE — Telephone Encounter (Signed)
Returned email with a phone call to let know her prescription was sent to both her local pharmacy and mail order.

## 2016-08-09 NOTE — Telephone Encounter (Signed)
MyChart Message:  Since I was released from the Hospital on Aug. 30, from heart failure, I have been coughing up old blood clots. There do not seem to be as many now as was in the beginning. I don't know if this is important or not. just wanted to let you know. please advise.  ----- Called patient to discuss.  She said that since leaving the hospital on 8/30 she has been coughing out dark blood clots.  She said the clots look "old" and have gotten a little better now.  She has no other concerns.  Scheduled patient to see Dr. Ashok Cordia tomorrow at 3:45pm.    Patient aware of appointment.  Nothing further needed.

## 2016-08-09 NOTE — Patient Instructions (Signed)
Your physician has recommended you make the following change in your medication:  1)Take lasix 20mg  today when you get home -- for the next 2 days increase lasix to 40mg  a day then return to normal dose of 20mg  daily  Scheduler will be in touch with you regarding appointment for stress test and follow up with cardiology.

## 2016-08-10 ENCOUNTER — Ambulatory Visit: Payer: Medicare Other | Admitting: Pulmonary Disease

## 2016-08-10 ENCOUNTER — Encounter (HOSPITAL_COMMUNITY): Payer: Self-pay | Admitting: Emergency Medicine

## 2016-08-10 ENCOUNTER — Observation Stay (HOSPITAL_COMMUNITY): Payer: Medicare Other

## 2016-08-10 ENCOUNTER — Observation Stay (HOSPITAL_COMMUNITY)
Admission: EM | Admit: 2016-08-10 | Discharge: 2016-08-11 | Disposition: A | Discharge status: Home / Self Care | Payer: Medicare Other | Attending: Nephrology | Admitting: Nephrology

## 2016-08-10 ENCOUNTER — Other Ambulatory Visit: Payer: Self-pay

## 2016-08-10 ENCOUNTER — Emergency Department (HOSPITAL_COMMUNITY): Payer: Medicare Other

## 2016-08-10 DIAGNOSIS — G894 Chronic pain syndrome: Secondary | ICD-10-CM | POA: Diagnosis not present

## 2016-08-10 DIAGNOSIS — F418 Other specified anxiety disorders: Secondary | ICD-10-CM | POA: Insufficient documentation

## 2016-08-10 DIAGNOSIS — K219 Gastro-esophageal reflux disease without esophagitis: Secondary | ICD-10-CM | POA: Insufficient documentation

## 2016-08-10 DIAGNOSIS — R55 Syncope and collapse: Secondary | ICD-10-CM | POA: Diagnosis not present

## 2016-08-10 DIAGNOSIS — T502X5A Adverse effect of carbonic-anhydrase inhibitors, benzothiadiazides and other diuretics, initial encounter: Secondary | ICD-10-CM | POA: Diagnosis not present

## 2016-08-10 DIAGNOSIS — I5042 Chronic combined systolic (congestive) and diastolic (congestive) heart failure: Secondary | ICD-10-CM | POA: Insufficient documentation

## 2016-08-10 DIAGNOSIS — I950 Idiopathic hypotension: Secondary | ICD-10-CM | POA: Diagnosis not present

## 2016-08-10 DIAGNOSIS — I480 Paroxysmal atrial fibrillation: Secondary | ICD-10-CM | POA: Diagnosis not present

## 2016-08-10 DIAGNOSIS — G4700 Insomnia, unspecified: Secondary | ICD-10-CM | POA: Insufficient documentation

## 2016-08-10 DIAGNOSIS — I110 Hypertensive heart disease with heart failure: Principal | ICD-10-CM | POA: Insufficient documentation

## 2016-08-10 DIAGNOSIS — J410 Simple chronic bronchitis: Secondary | ICD-10-CM | POA: Diagnosis not present

## 2016-08-10 DIAGNOSIS — J449 Chronic obstructive pulmonary disease, unspecified: Secondary | ICD-10-CM

## 2016-08-10 DIAGNOSIS — R404 Transient alteration of awareness: Secondary | ICD-10-CM | POA: Diagnosis not present

## 2016-08-10 DIAGNOSIS — I5040 Unspecified combined systolic (congestive) and diastolic (congestive) heart failure: Secondary | ICD-10-CM | POA: Diagnosis present

## 2016-08-10 DIAGNOSIS — Z87891 Personal history of nicotine dependence: Secondary | ICD-10-CM | POA: Insufficient documentation

## 2016-08-10 DIAGNOSIS — E785 Hyperlipidemia, unspecified: Secondary | ICD-10-CM | POA: Diagnosis not present

## 2016-08-10 DIAGNOSIS — M1990 Unspecified osteoarthritis, unspecified site: Secondary | ICD-10-CM | POA: Insufficient documentation

## 2016-08-10 DIAGNOSIS — Z7901 Long term (current) use of anticoagulants: Secondary | ICD-10-CM | POA: Diagnosis not present

## 2016-08-10 DIAGNOSIS — N3289 Other specified disorders of bladder: Secondary | ICD-10-CM | POA: Diagnosis not present

## 2016-08-10 DIAGNOSIS — I4891 Unspecified atrial fibrillation: Secondary | ICD-10-CM | POA: Diagnosis not present

## 2016-08-10 DIAGNOSIS — Z8673 Personal history of transient ischemic attack (TIA), and cerebral infarction without residual deficits: Secondary | ICD-10-CM | POA: Diagnosis not present

## 2016-08-10 DIAGNOSIS — E869 Volume depletion, unspecified: Secondary | ICD-10-CM

## 2016-08-10 DIAGNOSIS — I4819 Other persistent atrial fibrillation: Secondary | ICD-10-CM

## 2016-08-10 DIAGNOSIS — I959 Hypotension, unspecified: Secondary | ICD-10-CM

## 2016-08-10 DIAGNOSIS — I951 Orthostatic hypotension: Secondary | ICD-10-CM | POA: Diagnosis not present

## 2016-08-10 DIAGNOSIS — Z79899 Other long term (current) drug therapy: Secondary | ICD-10-CM | POA: Diagnosis not present

## 2016-08-10 LAB — CBC
HCT: 38.9 % (ref 36.0–46.0)
Hemoglobin: 12.6 g/dL (ref 12.0–15.0)
MCH: 26 pg (ref 26.0–34.0)
MCHC: 32.4 g/dL (ref 30.0–36.0)
MCV: 80.2 fL (ref 78.0–100.0)
Platelets: 151 10*3/uL (ref 150–400)
RBC: 4.85 MIL/uL (ref 3.87–5.11)
RDW: 16.5 % — ABNORMAL HIGH (ref 11.5–15.5)
WBC: 10.2 10*3/uL (ref 4.0–10.5)

## 2016-08-10 LAB — COMPREHENSIVE METABOLIC PANEL
ALT: 19 U/L (ref 14–54)
AST: 23 U/L (ref 15–41)
Albumin: 3.4 g/dL — ABNORMAL LOW (ref 3.5–5.0)
Alkaline Phosphatase: 62 U/L (ref 38–126)
Anion gap: 8 (ref 5–15)
BUN: 28 mg/dL — ABNORMAL HIGH (ref 6–20)
CO2: 30 mmol/L (ref 22–32)
Calcium: 8.7 mg/dL — ABNORMAL LOW (ref 8.9–10.3)
Chloride: 98 mmol/L — ABNORMAL LOW (ref 101–111)
Creatinine, Ser: 1.03 mg/dL — ABNORMAL HIGH (ref 0.44–1.00)
GFR calc Af Amer: 59 mL/min — ABNORMAL LOW (ref >60)
GFR calc non Af Amer: 51 mL/min — ABNORMAL LOW (ref >60)
Glucose, Bld: 104 mg/dL — ABNORMAL HIGH (ref 65–99)
Potassium: 4.3 mmol/L (ref 3.5–5.1)
Sodium: 136 mmol/L (ref 135–145)
Total Bilirubin: 0.7 mg/dL (ref 0.3–1.2)
Total Protein: 5.4 g/dL — ABNORMAL LOW (ref 6.5–8.1)

## 2016-08-10 LAB — URINALYSIS, ROUTINE W REFLEX MICROSCOPIC
Bilirubin Urine: NEGATIVE
Glucose, UA: NEGATIVE mg/dL
Hgb urine dipstick: NEGATIVE
Ketones, ur: NEGATIVE mg/dL
Nitrite: NEGATIVE
Protein, ur: NEGATIVE mg/dL
Specific Gravity, Urine: 1.013 (ref 1.005–1.030)
pH: 7.5 (ref 5.0–8.0)

## 2016-08-10 LAB — URINE MICROSCOPIC-ADD ON: WBC, UA: NONE SEEN WBC/hpf (ref 0–5)

## 2016-08-10 LAB — MAGNESIUM: Magnesium: 2.3 mg/dL (ref 1.7–2.4)

## 2016-08-10 LAB — PROTIME-INR
INR: 1.18
Prothrombin Time: 15.1 seconds (ref 11.4–15.2)

## 2016-08-10 LAB — TROPONIN I: Troponin I: <0.03 ng/mL (ref <0.03)

## 2016-08-10 LAB — PHOSPHORUS: Phosphorus: 3.5 mg/dL (ref 2.5–4.6)

## 2016-08-10 LAB — BRAIN NATRIURETIC PEPTIDE: B Natriuretic Peptide: 447.4 pg/mL — ABNORMAL HIGH (ref 0.0–100.0)

## 2016-08-10 MED ORDER — AZELASTINE HCL 0.1 % NA SOLN
2.0000 | Freq: Every day | NASAL | Status: DC
  Administered 2016-08-11: 2 via NASAL
  Filled 2016-08-10: qty 30

## 2016-08-10 MED ORDER — SODIUM CHLORIDE 0.9 % IV SOLN
INTRAVENOUS | Status: DC
  Administered 2016-08-10 – 2016-08-11 (×2): via INTRAVENOUS

## 2016-08-10 MED ORDER — SODIUM CHLORIDE 0.9% FLUSH: 3.0000 mL | Freq: Two times a day (BID) | INTRAVENOUS | Status: DC

## 2016-08-10 MED ORDER — MELATONIN 3 MG PO TABS
3.0000 mg | ORAL_TABLET | Freq: Every day | ORAL | Status: DC
  Administered 2016-08-10: 3 mg via ORAL
  Filled 2016-08-10: qty 1

## 2016-08-10 MED ORDER — APIXABAN 5 MG PO TABS
5.0000 mg | ORAL_TABLET | Freq: Two times a day (BID) | ORAL | Status: DC
  Administered 2016-08-10 – 2016-08-11 (×2): 5 mg via ORAL
  Filled 2016-08-10 (×2): qty 1

## 2016-08-10 MED ORDER — PANTOPRAZOLE SODIUM 40 MG PO TBEC
80.0000 mg | DELAYED_RELEASE_TABLET | Freq: Every day | ORAL | Status: DC
  Administered 2016-08-11: 80 mg via ORAL
  Filled 2016-08-10: qty 2

## 2016-08-10 MED ORDER — CYCLOBENZAPRINE HCL 5 MG PO TABS
5.0000 mg | ORAL_TABLET | Freq: Three times a day (TID) | ORAL | Status: DC | PRN
  Administered 2016-08-10: 5 mg via ORAL
  Filled 2016-08-10: qty 1

## 2016-08-10 MED ORDER — SODIUM CHLORIDE 0.9 % IV BOLUS (SEPSIS): 500.0000 mL | Freq: Once | INTRAVENOUS | Status: DC

## 2016-08-10 MED ORDER — MIRABEGRON ER 25 MG PO TB24
50.0000 mg | ORAL_TABLET | Freq: Every day | ORAL | Status: DC
  Administered 2016-08-10: 50 mg via ORAL
  Filled 2016-08-10 (×2): qty 1

## 2016-08-10 MED ORDER — ALBUTEROL SULFATE (2.5 MG/3ML) 0.083% IN NEBU: 3.0000 mL | INHALATION_SOLUTION | Freq: Four times a day (QID) | RESPIRATORY_TRACT | Status: DC | PRN

## 2016-08-10 MED ORDER — LORAZEPAM 0.5 MG PO TABS: 0.5000 mg | ORAL_TABLET | Freq: Two times a day (BID) | ORAL | Status: DC | PRN

## 2016-08-10 MED ORDER — MOMETASONE FURO-FORMOTEROL FUM 200-5 MCG/ACT IN AERO
2.0000 | INHALATION_SPRAY | Freq: Two times a day (BID) | RESPIRATORY_TRACT | Status: DC
  Administered 2016-08-10 – 2016-08-11 (×2): 2 via RESPIRATORY_TRACT
  Filled 2016-08-10: qty 8.8

## 2016-08-10 MED ORDER — CITALOPRAM HYDROBROMIDE 20 MG PO TABS
20.0000 mg | ORAL_TABLET | Freq: Every day | ORAL | Status: DC
  Administered 2016-08-11: 20 mg via ORAL
  Filled 2016-08-10: qty 1

## 2016-08-10 MED ORDER — MONTELUKAST SODIUM 10 MG PO TABS
10.0000 mg | ORAL_TABLET | Freq: Every day | ORAL | Status: DC
  Administered 2016-08-11: 10 mg via ORAL
  Filled 2016-08-10: qty 1

## 2016-08-10 MED ORDER — METOPROLOL SUCCINATE ER 25 MG PO TB24
25.0000 mg | ORAL_TABLET | Freq: Every day | ORAL | Status: DC
  Administered 2016-08-11: 25 mg via ORAL
  Filled 2016-08-10: qty 1

## 2016-08-10 MED ORDER — TIOTROPIUM BROMIDE MONOHYDRATE 18 MCG IN CAPS
18.0000 ug | ORAL_CAPSULE | Freq: Every day | RESPIRATORY_TRACT | Status: DC
  Administered 2016-08-11: 18 ug via RESPIRATORY_TRACT
  Filled 2016-08-10: qty 5

## 2016-08-10 MED ORDER — FUROSEMIDE 20 MG PO TABS
20.0000 mg | ORAL_TABLET | Freq: Every day | ORAL | Status: DC
  Administered 2016-08-11: 20 mg via ORAL
  Filled 2016-08-10: qty 1

## 2016-08-10 MED ORDER — SODIUM CHLORIDE 0.9 % IV SOLN
INTRAVENOUS | Status: AC
  Administered 2016-08-10: 20:00:00 via INTRAVENOUS

## 2016-08-10 NOTE — H&P (Signed)
History and Physical    [name] [id number] DOB: 11-27-1941 DOA: 08/10/2016  PCP: Cathlean Cower, MD Patient coming from: home  Chief Complaint: syncope  HPI: [name] is a 75 y.o. female with medical history significant of anxiety, COPD, depression, etc. fibrillation, GERD, hiatal hernia, initial become HTN, nephrolithiasis, CVA. The patient recently admitted due to acute respiratory failure secondary to CHF exacerbation and COPD exacerbation in the setting of A. fib with RVR. Patient states that since time of discharge on the 29th she felt at baseline until approximately 08/08/2016 when she developed bilateral lower extremity swelling and progressive weakness. She talked to her care team at the vascular office who recommended she double her Lasix dose. She did so on 08/09/2016 and then again on 08/10/2016 in the morning. Patient noted brisk diuresis with this dose. Patient does not weigh herself daily. Patient noted however that after diuresis on 08/10/2016 she also became very weak and lightheaded. All standing next to her husband patient endorses a syncopal episode. Denies any significant palpitations, shortness breath, chest pain, nausea, vomiting, cough, fever, dysuria, frequency, back pain. Patient does endorse intermittent episodes of palpitations ever since previous admission.  ED Course: Objective findings outlined below. Patient given a 500 mL fluid bolus and admitted  Review of Systems: As per HPI otherwise 10 point review of systems negative.   Ambulatory Status: No restrictions  Past Medical History:  Diagnosis Date  . Allergic rhinitis   . Allergy   . Anxiety   . Basal cell carcinoma   . Cataract    bil cataracts removed  . COPD (chronic obstructive pulmonary disease) ([place])   . Depression 11/29/2013  . DI (detrusor instability)   . Dysrhythmia   . Esophageal stricture   . GERD (gastroesophageal reflux disease)   . Hemorrhoids   . Hiatal hernia   . History of  cerebrovascular accident   . History of kidney stones   . HLD (hyperlipidemia)   . HTN (hypertension)   . Nephrolithiasis    hx  . Osteoarthritis   . Pneumonia 2013  . Shingles since Nov 18, 2013   on right arm from shoulder to wrist  . Shortness of breath dyspnea    WITH EXERTION   . Status post dilation of esophageal narrowing   . Stroke [facility]) 2001   STROKES, TIA'S  . TIA (transient ischemic attack) last 2001   anticoagulation therapy on coumadin    Past Surgical History:  Procedure Laterality Date  . ANTERIOR AND POSTERIOR VAGINAL REPAIR  2008   A&P REPAIR-DR MCDIARMID  . ANTERIOR LATERAL LUMBAR FUSION 4 LEVELS Right 07/03/2015   Procedure: Right Lumbar one-two, Lumbar two-three, Lumbar three-four, Lumbar four-five Anterior lateral lumbar interbody fusion;  Surgeon: Erline Levine, MD;  Location: [facility];  Service: Neurosurgery;  Laterality: Right;  Right L1-2 L2-3 L3-4 L4-5 Anterior lateral lumbar interbody fusion  . BALLOON DILATION N/A 03/03/2014   Procedure: BALLOON DILATION;  Surgeon: Inda Castle, MD;  Location: [facility] ENDOSCOPY;  Service: Endoscopy;  Laterality: N/A;  . BLADDER SUSPENSION    . CARDIOVERSION N/A 08/01/2016   Procedure: CARDIOVERSION;  Surgeon: Jerline Pain, MD;  Location: [facility] ENDOSCOPY;  Service: Cardiovascular;  Laterality: N/A;  . COLONOSCOPY    . ESOPHAGOGASTRODUODENOSCOPY N/A 03/03/2014   Procedure: ESOPHAGOGASTRODUODENOSCOPY (EGD);  Surgeon: Inda Castle, MD;  Location: [facility];  Service: Endoscopy;  Laterality: N/A;  . EXCISION OF BASAL CELL CA  2011  . GLUTEUS MINIMUS REPAIR Right 03/15/2016  Procedure: RIGHT HIP GLUTEUS MEDIUS TENDON REPAIR;  Surgeon: Paralee Cancel, MD;  Location: [facility] ORS;  Service: Orthopedics;  Laterality: Right;  . LUMBAR PERCUTANEOUS PEDICLE SCREW 4 LEVEL Right 07/03/2015   Procedure: Lumbar one-Sacral one Bilateral percutaneous pedicle screws;  Surgeon: Erline Levine, MD;  Location: [facility]UR[facility] ORS;  Service: Neurosurgery;   Laterality: Right;  L1-S1 Bilateral percutaneous pedicle screws  . RECTOCELE REPAIR  2008   A&P REPAIR -DR. MCDIARMID  . THUMB SURGERY Right 10-15 yrs ago  . UPPER GASTROINTESTINAL ENDOSCOPY    . VAGINAL HYSTERECTOMY  1983   NO BSO-DR. MABRY  . VARICOSE VEIN SURGERY      Social History   Social History  . Marital status: Married    Spouse name: N/A  . Number of children: 2  . Years of education: N/A   Occupational History  . retired Retired   Social History Main Topics  . Smoking status: Former Smoker    Packs/day: 3.00    Years: 42.00    Types: Cigarettes    Start date: 08/04/1954    Quit date: 12/06/1995  . Smokeless tobacco: Never Used  . Alcohol use 0.0 oz/week     Comment: occas very little  . Drug use: No  . Sexual activity: Not Currently    Birth control/ protection: Surgical     Comment: HYST-1st intercourse 35 yo-2 partners   Other Topics Concern  . Not on file   Social History Narrative   Retired- Dentist Pulmonary:   Originally from [place]. Has always lived in [place]. She has traveled to [place], [place], [place], [place], [place], [place]. Has been on multiple cruises. 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 She did use a face mask consistently to avoid chemical fume exposure. She may have only had an inhaled exposure a couple of times. She currently has a dog. Remote exposure to a parakeet. No mold or hot tub exposure. No asbestos exposure.     Allergies  Allergen Reactions  . Clarithromycin Other (See Comments)    REACTION: possible rash but could have been legionarres dz with rash  . Lipitor [Atorvastatin] Other (See Comments)    MUSCLE SPASMS  . Simvastatin Other (See Comments)    Muscle and joint pain  . Crestor [Rosuvastatin Calcium]     fagitue and joint pain, "NO STATINS"    Family History  Problem Relation Age of Onset  . COPD Mother     . Breast cancer Mother     mid 33's  . Uterine cancer Mother   . Emphysema Mother   . Breast cancer Maternal Aunt     x 3 aunts, post menopausal  . Colon cancer Neg Hx   . Esophageal cancer Neg Hx   . Rectal cancer Neg Hx   . Stomach cancer Neg Hx   . Pancreatic cancer Neg Hx     Prior to Admission medications   Medication Sig Start Date End Date Taking? Authorizing Provider  albuterol (PROVENTIL HFA;VENTOLIN HFA) 108 (90 BASE) MCG/ACT inhaler Inhale 1-2 puffs into the lungs every 6 (six) hours as needed for wheezing or shortness of breath. 01/06/15  Yes Elsie Stain, MD  apixaban (ELIQUIS) 5 MG TABS tablet Take 1 tablet (5 mg total) by mouth 2 (two) times daily. 08/02/16  Yes Eugenie Filler, MD  azelastine (ASTELIN) 0.1 % nasal spray Place 2 sprays into  the nose daily. Use in each nostril as directed Patient taking differently: Place 2 sprays into both nostrils daily.  03/01/16  Yes Javier Glazier, MD  benzonatate (TESSALON) 100 MG capsule Take 1 capsule (100 mg total) by mouth 3 (three) times daily as needed for cough. 03/22/16  Yes Tammy S Parrett, NP  budesonide-formoterol (SYMBICORT) 160-4.5 MCG/ACT inhaler Inhale 2 puffs into the lungs 2 (two) times daily. 03/01/16  Yes Javier Glazier, MD  Calcium Carbonate-Vitamin D (CALCIUM + D PO) Take 1 tablet by mouth 2 (two) times daily.    Yes Historical Provider, MD  citalopram (CELEXA) 20 MG tablet Take 1 tablet (20 mg total) by mouth daily. 11/12/15  Yes Biagio Borg, MD  cyclobenzaprine (FLEXERIL) 5 MG tablet Take 1 tablet (5 mg total) by mouth 3 (three) times daily as needed for muscle spasms. 08/19/15  Yes Biagio Borg, MD  diltiazem (CARDIZEM CD) 300 MG 24 hr capsule Take 1 capsule (300 mg total) by mouth daily. 08/02/16  Yes Eugenie Filler, MD  ezetimibe (ZETIA) 10 MG tablet Take 1 tablet (10 mg total) by mouth daily. 07/26/16 10/24/16 Yes Fay Records, MD  fluticasone (FLONASE) 50 MCG/ACT nasal spray Place 1 spray into both  nostrils 2 (two) times daily. 12/22/15  Yes Javier Glazier, MD  furosemide (LASIX) 20 MG tablet Take 1 tablet (20 mg total) by mouth daily. Patient taking differently: Take 20-40 mg by mouth daily.  08/02/16  Yes Eugenie Filler, MD  irbesartan (AVAPRO) 150 MG tablet Take 1 tablet (150 mg total) by mouth every morning. 03/28/16  Yes Biagio Borg, MD  Lidocaine HCl 2 % CREA Apply 1 application topically daily as needed (for shingles flares).   Yes Historical Provider, MD  LORazepam (ATIVAN) 0.5 MG tablet Take 1 tablet (0.5 mg total) by mouth 2 (two) times daily as needed for anxiety. 08/02/16  Yes Eugenie Filler, MD  Magnesium 250 MG TABS Take 1 tablet by mouth daily as needed. Leg cramps   Yes Historical Provider, MD  Melatonin 1 MG TABS Take 3 mg by mouth at bedtime.    Yes Historical Provider, MD  meloxicam (MOBIC) 15 MG tablet Take 15 mg by mouth daily.   Yes Historical Provider, MD  metoprolol succinate (TOPROL XL) 25 MG 24 hr tablet Take 1 tablet (25 mg total) by mouth daily. 08/05/16  Yes Fay Records, MD  mirabegron ER (MYRBETRIQ) 50 MG TB24 tablet Take 50 mg by mouth daily.   Yes Historical Provider, MD  montelukast (SINGULAIR) 10 MG tablet Take 10 mg by mouth daily.   Yes Historical Provider, MD  Multiple Vitamin (MULTIVITAMIN) tablet Take 1 tablet by mouth daily.     Yes Historical Provider, MD  omeprazole (PRILOSEC) 40 MG capsule Take 1 capsule (40 mg total) by mouth daily. 04/22/16  Yes Irene Shipper, MD  Respiratory Therapy Supplies (FLUTTER) DEVI Use 2-3 times per day 01/06/15  Yes Elsie Stain, MD  Spacer/Aero-Holding Chambers (AEROCHAMBER Z-STAT PLUS) inhaler Use as instructed 12/22/15  Yes Javier Glazier, MD  Tiotropium Bromide Monohydrate (SPIRIVA RESPIMAT) 2.5 MCG/ACT AERS Inhale 2 puffs into the lungs daily. 02/19/16  Yes Javier Glazier, MD  vitamin E 400 UNIT capsule Take 400 Units by mouth daily.   Yes Historical Provider, MD  apixaban (ELIQUIS) 5 MG TABS tablet Take 1  tablet (5 mg total) by mouth 2 (two) times daily. Patient not taking: Reported on 08/10/2016 08/02/16  Eugenie Filler, MD  furosemide (LASIX) 20 MG tablet Take 1 tablet (20 mg total) by mouth daily. Patient not taking: Reported on 08/10/2016 08/02/16   Eugenie Filler, MD    Physical Exam: Vitals:   08/10/16 1600 08/10/16 1630 08/10/16 1645 08/10/16 1715  BP: (!) 107/48 94/64 98/59  (!) 91/46  Pulse:    75  Resp: 15 20 18 20   Temp:    98.7 F (37.1 C)  TempSrc:    Oral  SpO2: 93% 95% 96% 93%  Weight:    64.5 kg (142 lb 3.2 oz)  Height:    5\' 4"  (1.626 m)     General:  Appears calm and comfortable Eyes:  PERRL, EOMI, normal lids, iris ENT:  grossly normal hearing, lips & tongue, mmm Neck:  no LAD, masses or thyromegaly Cardiovascular: Irregularly irregular, 3/6 systolic murmur, no mE edema bilaterally. Respiratory: Clear to auscultation bilaterally, no w/r/r. Normal respiratory effort. Abdomen:  soft, ntnd, NABS Skin:  no rash or induration seen on limited exam Musculoskeletal:  grossly normal tone BUE/BLE, good ROM, no bony abnormality Psychiatric:  grossly normal mood and affect, speech fluent and appropriate, AOx3 Neurologic:  CN 2-12 grossly intact, moves all extremities in coordinated fashion, sensation intact  Labs on Admission: I have personally reviewed following labs and imaging studies  CBC:  Recent Labs Lab 08/10/16 1336  WBC 10.2  HGB 12.6  HCT 38.9  MCV 80.2  PLT 123XX123   Basic Metabolic Panel:  Recent Labs Lab 08/09/16 1330 08/10/16 1336  NA 138 136  K 4.1 4.3  CL 104 98*  CO2 28 30  GLUCOSE 92 104*  BUN 44* 28*  CREATININE 0.99 1.03*  CALCIUM 8.9 8.7*   GFR: Estimated Creatinine Clearance: 39.5 mL/min (by C-G formula based on SCr of 1.03 mg/dL). Liver Function Tests:  Recent Labs Lab 08/10/16 1336  AST 23  ALT 19  ALKPHOS 62  BILITOT 0.7  PROT 5.4*  ALBUMIN 3.4*   No results for input(s): LIPASE, AMYLASE in the last 168 hours. No  results for input(s): AMMONIA in the last 168 hours. Coagulation Profile:  Recent Labs Lab 08/10/16 1336  INR 1.18   Cardiac Enzymes:  Recent Labs Lab 08/10/16 1336  TROPONINI <0.03   BNP (last 3 results) No results for input(s): PROBNP in the last 8760 hours. HbA1C: No results for input(s): HGBA1C in the last 72 hours. CBG: No results for input(s): GLUCAP in the last 168 hours. Lipid Profile: No results for input(s): CHOL, HDL, LDLCALC, TRIG, CHOLHDL, LDLDIRECT in the last 72 hours. Thyroid Function Tests: No results for input(s): TSH, T4TOTAL, FREET4, T3FREE, THYROIDAB in the last 72 hours. Anemia Panel: No results for input(s): VITAMINB12, FOLATE, FERRITIN, TIBC, IRON, RETICCTPCT in the last 72 hours. Urine analysis:    Component Value Date/Time   COLORURINE YELLOW 08/10/2016 1335   APPEARANCEUR CLOUDY (A) 08/10/2016 1335   LABSPEC 1.013 08/10/2016 1335   PHURINE 7.5 08/10/2016 1335   GLUCOSEU NEGATIVE 08/10/2016 1335   GLUCOSEU NEGATIVE 12/02/2014 1528   HGBUR NEGATIVE 08/10/2016 1335   BILIRUBINUR NEGATIVE 08/10/2016 1335   KETONESUR NEGATIVE 08/10/2016 1335   PROTEINUR NEGATIVE 08/10/2016 1335   UROBILINOGEN 0.2 07/08/2015 1015   NITRITE NEGATIVE 08/10/2016 1335   LEUKOCYTESUR TRACE (A) 08/10/2016 1335    Creatinine Clearance: Estimated Creatinine Clearance: 39.5 mL/min (by C-G formula based on SCr of 1.03 mg/dL).  Sepsis Labs: @LABRCNTIP (procalcitonin:4,lacticidven:4) )No results found for this or any previous visit (from the past 240  hour(s)).   Radiological Exams on Admission: Dg Chest Port 1 View  Result Date: 08/10/2016 CLINICAL DATA:  Syncopal episode EXAM: PORTABLE CHEST 1 VIEW COMPARISON:  07/28/2016 FINDINGS: Cardiac shadow is mildly enlarged in size. The lungs are well aerated bilaterally without focal infiltrate or sizable effusion. Previously seen bibasilar changes have resolved in the interval. No bony abnormality is seen. IMPRESSION: No active  disease. Electronically Signed   By: Inez Catalina M.D.   On: 08/10/2016 13:45    EKG: Independently reviewed. Atrial fibrillation, no signs of ACS.  Assessment/Plan Active Problems:   Combined congestive systolic and diastolic heart failure (HCC)   Insomnia   Syncope   PAF (paroxysmal atrial fibrillation) (HCC)   COPD (chronic obstructive pulmonary disease) (HCC)   Hypotension   Syncope: Likely secondary to hypo-tension due to overdiuresis setting of PAF. Did not hit her head and she was caught by her husband. - Telemetry/observation - Syncope order set utilized - patient with admission for A. fib with RVR approximately 1 week ago so no need to repeat echo at this time. - No significant focal neurological modalities or history of the same and no history of head trauma so no need for head CT at this time. - orthostatics  PAF: Recent admission for new onset with RVR. Currently rate controlled. CHA2DS2-VASc = 8 - Continue Eliquis - Hold metoprolol, diltiazem - due to hypotension  Chronic combined systolic and diastolic congestive heart failure: Last echo showing 45 EF and grade 2 diastolic dysfunction. Recent admission for CHF decompensation. Of note patient with significant home diuresis over the last 2 days. Currently weight 144. This appears to be close to patient's baseline. - Continue Lasix in a.m.  Hypertension: Currently hypotensive likely secondary to intravascular depletion from over diuresis - Resume blood pressure medications when pressure improves  Anxiety: - Continue Ativan  GERD - Continue PPI  COPD: Patient received admitted for COPD exacerbation. Currently at baseline. - Singulair, albuterol  Depression: - Continue Celexa  Bladder spasm: - Continue myrbetriq    DVT prophylaxis: Eliquis  Code Status: full  Family Communication: husband and sister  Disposition Plan: pending improvement  Consults called:  none  Admission status: observation    MERRELL,  DAVID J MD [facility]  If 7PM-7AM, please contact night-coverage [email or web address] Password [email or web address]  08/10/2016, 6:15 PM

## 2016-08-10 NOTE — ED Notes (Signed)
MD at bedside. 

## 2016-08-10 NOTE — ED Notes (Signed)
Placed pt onto bedpan, pt tolerated well.

## 2016-08-10 NOTE — ED Provider Notes (Signed)
[facility] DEPT Provider Note   CSN: [id number] Arrival date & time: 08/10/16  1319     History   Chief Complaint Chief Complaint  Patient presents with  . Loss of Consciousness    HPI [name] is a 75 y.o. female.  Patient c/o generalized weakness, and that bp was low at home today, 80/60.  Patient had gotten up and had syncopal event/eased to ground by spouse. States her normal bp is in the 110-120 range.  States recently in hospital with uncontrolled afib and breathing/copd problems.  States 2 days ago noted increased bilateral leg/ankle edema, and saw her cardiologist, and was advised to double her lasix dose yesterday and today, which she did.  Patient indicates she has since urinating a large amount, and that the edema has resolved, but now feels weak and bp is low. No focal or unilateral numbness/weakness. Patient denies headache. No increased cough or uri c/o. No chest pain or discomfort. No sob. No abd pain. No nvd. No dysuria or gu c/o. Normal appetite. Denies any blood loss or melena. No fever or chills. States compliant w normal meds.       The history is provided by the patient.  Loss of Consciousness   Pertinent negatives include abdominal pain, back pain, chest pain, confusion, fever, headaches and vomiting.    Past Medical History:  Diagnosis Date  . Allergic rhinitis   . Allergy   . Anxiety   . Basal cell carcinoma   . Cataract    bil cataracts removed  . COPD (chronic obstructive pulmonary disease) ([place])   . Depression 11/29/2013  . DI (detrusor instability)   . Dysrhythmia   . Esophageal stricture   . GERD (gastroesophageal reflux disease)   . Hemorrhoids   . Hiatal hernia   . History of cerebrovascular accident   . History of kidney stones   . HLD (hyperlipidemia)   . HTN (hypertension)   . Nephrolithiasis    hx  . Osteoarthritis   . Pneumonia 2013  . Shingles since Nov 18, 2013   on right arm from shoulder to wrist  . Shortness of  breath dyspnea    WITH EXERTION   . Status post dilation of esophageal narrowing   . Stroke [facility]) 2001   STROKES, TIA'S  . TIA (transient ischemic attack) last 2001   anticoagulation therapy on coumadin    Patient Active Problem List   Diagnosis Date Noted  . Chronic pain syndrome   . Atrial fibrillation with rapid ventricular response ([place]) 07/27/2016  . Acute respiratory failure ([place]) 07/27/2016  . 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 HLD (hyperlipidemia) 07/27/2016  . Essential hypertension 07/27/2016  . GERD (gastroesophageal reflux disease) 07/27/2016  . Insomnia 07/27/2016  . Depression with anxiety 07/27/2016  . New onset atrial fibrillation ([place]) 07/27/2016  . Respiratory distress   . Gluteus medius tear 03/15/2016  . Hyperglycemia 11/12/2015  . Lumbar scoliosis 07/03/2015  . Stricture and stenosis of esophagus 03/03/2014  . On home oxygen therapy   . Swelling, mass, or lump in chest 12/19/2013  . Right wrist pain 12/19/2013  . Depression 11/29/2013  . Dysphagia, unspecified(787.20) 09/30/2013  . Lung nodule 08/01/2013  . Special screening for malignant neoplasms, colon 03/20/2013  . Lower back pain 01/21/2013  . COPD exacerbation ([place]) 09/05/2012  . Encounter for long-term (current) use of high-risk medication 02/13/2012  . Sleep related leg cramps 10/01/2011  . Basal cell carcinoma   .  Osteoarthritis   . History of CVA (cerebrovascular accident)   . DUB (dysfunctional uterine bleeding)   . Atrophic vaginitis   . Menopausal symptoms   . DI (detrusor instability)   . Preventative health care 08/07/2011  . Long term current use of Coumadin 01/05/2011  . Transient cerebral ischemia 03/25/2009  . FATIGUE 06/05/2008  . ALLERGIC RHINITIS 04/24/2008  . NEPHROLITHIASIS, HX OF 10/05/2007  . RECTOCELE WITHOUT MENTION OF UTERINE PROLAPSE 10/04/2007  . BLADDER SUSPENSION, HX OF 10/04/2007  . Hyperlipidemia 08/11/2007  .  HEMORRHOIDS 08/11/2007  . Gold B Copd with acute bronchitis 08/11/2007  . GERD 08/11/2007  . IBS 08/11/2007  . CEREBROVASCULAR ACCIDENT, HX OF 08/11/2007  . FIBROCYSTIC BREAST DISEASE, HX OF 08/11/2007    Past Surgical History:  Procedure Laterality Date  . ANTERIOR AND POSTERIOR VAGINAL REPAIR  2008   A&P REPAIR-DR MCDIARMID  . ANTERIOR LATERAL LUMBAR FUSION 4 LEVELS Right 07/03/2015   Procedure: Right Lumbar one-two, Lumbar two-three, Lumbar three-four, Lumbar four-five Anterior lateral lumbar interbody fusion;  Surgeon: Erline Levine, MD;  Location: [facility]S;  Service: Neurosurgery;  Laterality: Right;  Right L1-2 L2-3 L3-4 L4-5 Anterior lateral lumbar interbody fusion  . BALLOON DILATION N/A 03/03/2014   Procedure: BALLOON DILATION;  Surgeon: Inda Castle, MD;  Location: [facility] ENDOSCOPY;  Service: Endoscopy;  Laterality: N/A;  . BLADDER SUSPENSION    . CARDIOVERSION N/A 08/01/2016   Procedure: CARDIOVERSION;  Surgeon: Jerline Pain, MD;  Location: [facility] ENDOSCOPY;  Service: Cardiovascular;  Laterality: N/A;  . COLONOSCOPY    . ESOPHAGOGASTRODUODENOSCOPY N/A 03/03/2014   Procedure: ESOPHAGOGASTRODUODENOSCOPY (EGD);  Surgeon: Inda Castle, MD;  Location: [facility];  Service: Endoscopy;  Laterality: N/A;  . EXCISION OF BASAL CELL CA  2011  . GLUTEUS MINIMUS REPAIR Right 03/15/2016   Procedure: RIGHT HIP GLUTEUS MEDIUS TENDON REPAIR;  Surgeon: Paralee Cancel, MD;  Location: [facility] ORS;  Service: Orthopedics;  Laterality: Right;  . LUMBAR PERCUTANEOUS PEDICLE SCREW 4 LEVEL Right 07/03/2015   Procedure: Lumbar one-Sacral one Bilateral percutaneous pedicle screws;  Surgeon: Erline Levine, MD;  Location: [facility] NEURO ORS;  Service: Neurosurgery;  Laterality: Right;  L1-S1 Bilateral percutaneous pedicle screws  . RECTOCELE REPAIR  2008   A&P REPAIR -DR. MCDIARMID  . THUMB SURGERY Right 10-15 yrs ago  . UPPER GASTROINTESTINAL ENDOSCOPY    . VAGINAL HYSTERECTOMY  1983   NO BSO-DR. MABRY  . VARICOSE  VEIN SURGERY      OB History    Gravida Para Term Preterm AB Living   2 2 2     2    SAB TAB Ectopic Multiple Live Births                   Home Medications    Prior to Admission medications   Medication Sig Start Date End Date Taking? Authorizing Provider  albuterol (PROVENTIL HFA;VENTOLIN HFA) 108 (90 BASE) MCG/ACT inhaler Inhale 1-2 puffs into the lungs every 6 (six) hours as needed for wheezing or shortness of breath. 01/06/15   Elsie Stain, MD  apixaban (ELIQUIS) 5 MG TABS tablet Take 1 tablet (5 mg total) by mouth 2 (two) times daily. 08/02/16   Eugenie Filler, MD  apixaban (ELIQUIS) 5 MG TABS tablet Take 1 tablet (5 mg total) by mouth 2 (two) times daily. 08/02/16   Eugenie Filler, MD  azelastine (ASTELIN) 0.1 % nasal spray Place 2 sprays into the nose daily. Use in each nostril as directed 03/01/16  Javier Glazier, MD  benzonatate (TESSALON) 100 MG capsule Take 1 capsule (100 mg total) by mouth 3 (three) times daily as needed for cough. Patient not taking: Reported on 08/09/2016 03/22/16   Fonnie Mu Parrett, NP  budesonide-formoterol (SYMBICORT) 160-4.5 MCG/ACT inhaler Inhale 2 puffs into the lungs 2 (two) times daily. 03/01/16   Javier Glazier, MD  Calcium Carbonate-Vitamin D (CALCIUM + D PO) Take 1 tablet by mouth 2 (two) times daily.     Historical Provider, MD  citalopram (CELEXA) 20 MG tablet Take 1 tablet (20 mg total) by mouth daily. 11/12/15   Biagio Borg, MD  cyclobenzaprine (FLEXERIL) 5 MG tablet Take 1 tablet (5 mg total) by mouth 3 (three) times daily as needed for muscle spasms. Patient not taking: Reported on 08/09/2016 08/19/15   Biagio Borg, MD  diltiazem (CARDIZEM CD) 300 MG 24 hr capsule Take 1 capsule (300 mg total) by mouth daily. 08/02/16   Eugenie Filler, MD  ezetimibe (ZETIA) 10 MG tablet Take 1 tablet (10 mg total) by mouth daily. 07/26/16 10/24/16  Fay Records, MD  fluticasone (FLONASE) 50 MCG/ACT nasal spray Place 1 spray into both nostrils 2 (two)  times daily. 12/22/15   Javier Glazier, MD  furosemide (LASIX) 20 MG tablet Take 1 tablet (20 mg total) by mouth daily. 08/02/16   Eugenie Filler, MD  furosemide (LASIX) 20 MG tablet Take 1 tablet (20 mg total) by mouth daily. 08/02/16   Eugenie Filler, MD  irbesartan (AVAPRO) 150 MG tablet Take 1 tablet (150 mg total) by mouth every morning. 03/28/16   Biagio Borg, MD  LORazepam (ATIVAN) 0.5 MG tablet Take 1 tablet (0.5 mg total) by mouth 2 (two) times daily as needed for anxiety. 08/02/16   Eugenie Filler, MD  Magnesium 250 MG TABS Take 1 tablet by mouth daily as needed. Leg cramps    Historical Provider, MD  Melatonin 1 MG TABS Take 3 mg by mouth at bedtime.     Historical Provider, MD  meloxicam (MOBIC) 15 MG tablet Take 15 mg by mouth daily.    Historical Provider, MD  metoprolol succinate (TOPROL XL) 25 MG 24 hr tablet Take 1 tablet (25 mg total) by mouth daily. 08/05/16   Fay Records, MD  mirabegron ER (MYRBETRIQ) 50 MG TB24 tablet Take 50 mg by mouth daily.    Historical Provider, MD  montelukast (SINGULAIR) 10 MG tablet Take 10 mg by mouth daily.    Historical Provider, MD  Multiple Vitamin (MULTIVITAMIN) tablet Take 1 tablet by mouth daily.      Historical Provider, MD  omeprazole (PRILOSEC) 40 MG capsule Take 1 capsule (40 mg total) by mouth daily. 04/22/16   Irene Shipper, MD  Respiratory Therapy Supplies (FLUTTER) DEVI Use 2-3 times per day 01/06/15   Elsie Stain, MD  Spacer/Aero-Holding Chambers (AEROCHAMBER Z-STAT PLUS) inhaler Use as instructed 12/22/15   Javier Glazier, MD  Tiotropium Bromide Monohydrate (SPIRIVA RESPIMAT) 2.5 MCG/ACT AERS Inhale 2 puffs into the lungs daily. 02/19/16   Javier Glazier, MD  vitamin E 400 UNIT capsule Take 400 Units by mouth daily.    Historical Provider, MD    Family History Family History  Problem Relation Age of Onset  . COPD Mother   . Breast cancer Mother     mid 28's  . Uterine cancer Mother   . Emphysema Mother   . Breast  cancer Maternal Aunt  x 3 aunts, post menopausal  . Colon cancer Neg Hx   . Esophageal cancer Neg Hx   . Rectal cancer Neg Hx   . Stomach cancer Neg Hx   . Pancreatic cancer Neg Hx     Social History Social History  Substance Use Topics  . Smoking status: Former Smoker    Packs/day: 3.00    Years: 42.00    Types: Cigarettes    Start date: 08/04/1954    Quit date: 12/06/1995  . Smokeless tobacco: Never Used  . Alcohol use 0.0 oz/week     Comment: occas very little     Allergies   Clarithromycin; Lipitor [atorvastatin]; Simvastatin; and Crestor [rosuvastatin calcium]   Review of Systems Review of Systems  Constitutional: Negative for fever.  HENT: Negative for sore throat.   Eyes: Negative for visual disturbance.  Respiratory: Negative for cough and shortness of breath.   Cardiovascular: Positive for syncope. Negative for chest pain.  Gastrointestinal: Negative for abdominal pain and vomiting.  Genitourinary: Negative for dysuria and flank pain.  Musculoskeletal: Negative for back pain and neck pain.  Skin: Negative for rash.  Neurological: Negative for numbness and headaches.  Hematological: Does not bruise/bleed easily.  Psychiatric/Behavioral: Negative for confusion.     Physical Exam Updated Vital Signs BP 104/71 (BP Location: Left Arm)   Pulse 79   Temp 98.6 F (37 C) (Oral)   Resp 18   Ht 5\' 4"  (1.626 m)   Wt 65.5 kg   LMP  (Exact Date)   SpO2 96%   BMI 24.78 kg/m   Physical Exam  Constitutional: She appears well-developed and well-nourished. No distress.  HENT:  Head: Atraumatic.  Eyes: Conjunctivae are normal. Pupils are equal, round, and reactive to light. No scleral icterus.  Neck: Neck supple. No tracheal deviation present. No thyromegaly present.  No bruits.   Cardiovascular: Normal rate, normal heart sounds and intact distal pulses.  Exam reveals no gallop and no friction rub.   No murmur heard. Pulmonary/Chest: Effort normal and breath  sounds normal. No respiratory distress.  Abdominal: Soft. Normal appearance and bowel sounds are normal. She exhibits no distension. There is no tenderness.  Genitourinary:  Genitourinary Comments: No cva tenderness  Musculoskeletal: She exhibits no edema or tenderness.  Neurological: She is alert.  Speech clear/fluent. No facial droop or asymmetry.  Motor intact bil, stre 5/5.   Skin: Skin is warm and dry. No rash noted.  Psychiatric: She has a normal mood and affect.  Nursing note and vitals reviewed.    ED Treatments / Results  Labs (all labs ordered are listed, but only abnormal results are displayed) Results for orders placed or performed during the hospital encounter of 08/10/16  CBC  Result Value Ref Range   WBC 10.2 4.0 - 10.5 K/uL   RBC 4.85 3.87 - 5.11 MIL/uL   Hemoglobin 12.6 12.0 - 15.0 g/dL   HCT 38.9 36.0 - 46.0 %   MCV 80.2 78.0 - 100.0 fL   MCH 26.0 26.0 - 34.0 pg   MCHC 32.4 30.0 - 36.0 g/dL   RDW 16.5 (H) 11.5 - 15.5 %   Platelets 151 150 - 400 K/uL  Comprehensive metabolic panel  Result Value Ref Range   Sodium 136 135 - 145 mmol/L   Potassium 4.3 3.5 - 5.1 mmol/L   Chloride 98 (L) 101 - 111 mmol/L   CO2 30 22 - 32 mmol/L   Glucose, Bld 104 (H) 65 - 99 mg/dL  BUN 28 (H) 6 - 20 mg/dL   Creatinine, Ser 1.03 (H) 0.44 - 1.00 mg/dL   Calcium 8.7 (L) 8.9 - 10.3 mg/dL   Total Protein 5.4 (L) 6.5 - 8.1 g/dL   Albumin 3.4 (L) 3.5 - 5.0 g/dL   AST 23 15 - 41 U/L   ALT 19 14 - 54 U/L   Alkaline Phosphatase 62 38 - 126 U/L   Total Bilirubin 0.7 0.3 - 1.2 mg/dL   GFR calc non Af Amer 51 (L) >60 mL/min   GFR calc Af Amer 59 (L) >60 mL/min   Anion gap 8 5 - 15  Troponin I  Result Value Ref Range   Troponin I <0.03 <0.03 ng/mL  Urinalysis, Routine w reflex microscopic (not at [facility])  Result Value Ref Range   Color, Urine YELLOW YELLOW   APPearance CLOUDY (A) CLEAR   Specific Gravity, Urine 1.013 1.005 - 1.030   pH 7.5 5.0 - 8.0   Glucose, UA NEGATIVE  NEGATIVE mg/dL   Hgb urine dipstick NEGATIVE NEGATIVE   Bilirubin Urine NEGATIVE NEGATIVE   Ketones, ur NEGATIVE NEGATIVE mg/dL   Protein, ur NEGATIVE NEGATIVE mg/dL   Nitrite NEGATIVE NEGATIVE   Leukocytes, UA TRACE (A) NEGATIVE  Brain natriuretic peptide  Result Value Ref Range   B Natriuretic Peptide 447.4 (H) 0.0 - 100.0 pg/mL  Protime-INR  Result Value Ref Range   Prothrombin Time 15.1 11.4 - 15.2 seconds   INR 1.18   Urine microscopic-add on  Result Value Ref Range   Squamous Epithelial / LPF 0-5 (A) NONE SEEN   WBC, UA NONE SEEN 0 - 5 WBC/hpf   RBC / HPF 0-5 0 - 5 RBC/hpf   Bacteria, UA FEW (A) NONE SEEN     Dg Chest Port 1 View  Result Date: 08/10/2016 CLINICAL DATA:  Syncopal episode EXAM: PORTABLE CHEST 1 VIEW COMPARISON:  07/28/2016 FINDINGS: Cardiac shadow is mildly enlarged in size. The lungs are well aerated bilaterally without focal infiltrate or sizable effusion. Previously seen bibasilar changes have resolved in the interval. No bony abnormality is seen. IMPRESSION: No active disease. Electronically Signed   By: Inez Catalina M.D.   On: 08/10/2016 13:45     EKG  EKG Interpretation  Date/Time:  Wednesday August 10 2016 13:23:53 EDT Ventricular Rate:  79 PR Interval:    QRS Duration: 116 QT Interval:  397 QTC Calculation: 456 R Axis:   49 Text Interpretation:  Atrial fibrillation Nonspecific intraventricular conduction delay No significant change since last tracing Confirmed by Ashok Cordia  MD, Lennette Bihari ([id number]) on 08/10/2016 1:32:59 PM       Radiology Dg Chest Port 1 View  Result Date: 08/10/2016 CLINICAL DATA:  Syncopal episode EXAM: PORTABLE CHEST 1 VIEW COMPARISON:  07/28/2016 FINDINGS: Cardiac shadow is mildly enlarged in size. The lungs are well aerated bilaterally without focal infiltrate or sizable effusion. Previously seen bibasilar changes have resolved in the interval. No bony abnormality is seen. IMPRESSION: No active disease. Electronically Signed   By:  Inez Catalina M.D.   On: 08/10/2016 13:45    Procedures Procedures (including critical care time)  Medications Ordered in ED Medications  0.9 %  sodium chloride infusion (not administered)     Initial Impression / Assessment and Plan / ED Course  I have reviewed the triage vital signs and the nursing notes.  Pertinent labs & imaging results that were available during my care of the patient were reviewed by me and considered in my medical  decision making (see chart for details).  Clinical Course    Iv ns. Labs. Monitor. Ecg.   Recheck pt, bp again low in 80's/ afeb.   Iv ns bolus.  Given syncope, recurrent hypotension (?due to recent med adjustments, volume depletion, vs other) - will admit, obs status.     Final Clinical Impressions(s) / ED Diagnoses   Final diagnoses:  None    New Prescriptions New Prescriptions   No medications on file     Lajean Saver, MD 08/10/16 1500

## 2016-08-10 NOTE — Patient Outreach (Signed)
Transition of care: Placed call to patient who reports that she is doing better today. Reports that she had a bad day yesterday. Reports that she went to the Afib clinic with swelling yesterday. Reports that her lasix was increased to 40 mg for 3 days.  Patient reports that she has lost 2.3 pounds since yesterday. Reports that she feels tired today but has a follow up with pulmonary this afternoon. Denies any new concerns.  PLAN: Reviewed with patient heart failure zones and when to call MD. Reviewed home visit planned for next week.   Lauren Rand, RN, BSN, CEN [facility] [employer] [phone]

## 2016-08-10 NOTE — ED Notes (Signed)
Admitting MD at bedside.

## 2016-08-10 NOTE — ED Notes (Signed)
Report given to Dee, RN 

## 2016-08-10 NOTE — ED Notes (Signed)
Pt tolerated food and drink. Pt states "i feel much better". Denies pain. Alert an orient x4

## 2016-08-10 NOTE — Progress Notes (Signed)
I saw pt last in Feb 2016  Prior to that it wa several years prior She does not appear to have a f/u in cardiology  Has stress test scheduled but nothing else   Needs to have f/u

## 2016-08-10 NOTE — Patient Outreach (Signed)
Telephone call from patient: Patient called and left a voicemail stating she passed out and did not know what to do.    Called patient back. No answer. Left a message for patient to call 911.  PLAN:Will follow up with patient. Patient is pending pulmonary MD visit today.  Tomasa Rand, RN, BSN, CEN [facility] [employer] [phone]

## 2016-08-10 NOTE — ED Triage Notes (Signed)
Pt arrives via [facility] from home, ems reports pt has been in bed all day, upon pt getting up to ambulate, pt had syncopal episode and was caught by husband, pt did not hit her head. Ems reports pt received 42ml normal saline, was orthostatic prior to fluids. Ems reports Pt has recent diagnosis of afib with rate 60's-70. Recent increase of lasix. Pt a/ox4, resp e/u.

## 2016-08-11 DIAGNOSIS — I481 Persistent atrial fibrillation: Secondary | ICD-10-CM | POA: Diagnosis not present

## 2016-08-11 DIAGNOSIS — I110 Hypertensive heart disease with heart failure: Secondary | ICD-10-CM | POA: Diagnosis not present

## 2016-08-11 DIAGNOSIS — I952 Hypotension due to drugs: Secondary | ICD-10-CM

## 2016-08-11 LAB — BASIC METABOLIC PANEL
Anion gap: 4 — ABNORMAL LOW (ref 5–15)
BUN: 20 mg/dL (ref 6–20)
CO2: 27 mmol/L (ref 22–32)
Calcium: 8.6 mg/dL — ABNORMAL LOW (ref 8.9–10.3)
Chloride: 107 mmol/L (ref 101–111)
Creatinine, Ser: 0.88 mg/dL (ref 0.44–1.00)
GFR calc Af Amer: >60 mL/min (ref >60)
GFR calc non Af Amer: >60 mL/min (ref >60)
Glucose, Bld: 95 mg/dL (ref 65–99)
Potassium: 4.2 mmol/L (ref 3.5–5.1)
Sodium: 138 mmol/L (ref 135–145)

## 2016-08-11 LAB — GLUCOSE, CAPILLARY: Glucose-Capillary: 102 mg/dL — ABNORMAL HIGH (ref 65–99)

## 2016-08-11 NOTE — Discharge Summary (Addendum)
Physician Discharge Summary  [name] [id number] DOB: [birth year] DOA: 08/10/2016  PCP: Cathlean Cower, MD  Admit date: 08/10/2016 Discharge date: 08/11/2016  Admitted From: Home Disposition:  Home with family.  Recommendations for Outpatient Follow-up:  1. Follow up with PCP in 1-2 weeks 2. Please obtain BMP/CBC in one week 3. Please follow up on the following pending results:  Home Health: No Equipment/Devices:None  Discharge Condition Stable CODE STATUS:Full Code Diet recommendation: Heart Healthy   Brief/Interim Summary:75 year old female with history of anxiety, COPD, depression, atrial fibrillation on systemic anticoagulation, as his reflux, essential hypertension, recently admitted for A. fib with RVR and CHF exacerbation presented with syncopal episode. Patient was seen by her cardiologist few days ago when the dose of Lasix was doubled for lower extremity edema. Patient reported that she had increased urination after the higher dose of Lasix and felt weak. She also felt dizzy and lightheadedness and reported syncopal episode at home. Denied head injury. On arrival to ER patient was hypotensive likely contributing patient's symptoms. She had positive orthostatic hypotension due to higher dose of lasix. Patient was observed in the hospital. Her blood pressure improved. Patient was advised to lower the dose of Lasix to 20 mg daily. Also advised to hold Irbesartan until she sees her PCP next week. Patient has no other neurological deficit. I advised patient to monitor blood pressure tomorrow for treatment at home twice a day and bring the results to her PCP. Patient states she has appointment with her PCP next Tuesday. She is eager to go home today.  Patient denied fever, chills, headache, dizziness, consistent, shortness of breath, nausea, vomiting, abdominal pain. At this time patient is medically stable to go home with outpatient follow-up. Patient verbalized understanding of follow-up  instruction.   Discharge Diagnoses:  Active Problems:   Combined congestive systolic and diastolic heart failure (HCC)   Insomnia   Syncope   PAF (paroxysmal atrial fibrillation) (HCC)   COPD (chronic obstructive pulmonary disease) ([place])   Hypotension   Faintness   Volume depletion    Discharge Instructions  Discharge Instructions    Call MD for:  persistant dizziness or light-headedness    Complete by:  As directed   Diet - low sodium heart healthy    Complete by:  As directed   Discharge instructions    Complete by:  As directed   Please monitor BP and HR at home and bring log of BP reading to your PCP and cardiologist. Hold irbesartan until you see your PCP next Tuesday. Your PCP can resume Irbesartan depending on your BP readings.  If you have recurrent or worsening of symptom please come to ER.  Low salt diet Please follow up with your PCP and Cardiologist.   Increase activity slowly    Complete by:  As directed       Medication List    STOP taking these medications   irbesartan 150 MG tablet Commonly known as:  AVAPRO     TAKE these medications   AEROCHAMBER Z-STAT PLUS inhaler Use as instructed   albuterol 108 (90 Base) MCG/ACT inhaler Commonly known as:  PROVENTIL HFA;VENTOLIN HFA Inhale 1-2 puffs into the lungs every 6 (six) hours as needed for wheezing or shortness of breath.   apixaban 5 MG Tabs tablet Commonly known as:  ELIQUIS Take 1 tablet (5 mg total) by mouth 2 (two) times daily. What changed:  Another medication with the same name was removed. Continue taking this medication, and follow the directions  you see here.   azelastine 0.1 % nasal spray Commonly known as:  ASTELIN Place 2 sprays into the nose daily. Use in each nostril as directed What changed:  how much to take  how to take this  when to take this  additional instructions   benzonatate 100 MG capsule Commonly known as:  TESSALON Take 1 capsule (100 mg total) by mouth 3  (three) times daily as needed for cough.   budesonide-formoterol 160-4.5 MCG/ACT inhaler Commonly known as:  SYMBICORT Inhale 2 puffs into the lungs 2 (two) times daily.   CALCIUM + D PO Take 1 tablet by mouth 2 (two) times daily.   citalopram 20 MG tablet Commonly known as:  CELEXA Take 1 tablet (20 mg total) by mouth daily.   cyclobenzaprine 5 MG tablet Commonly known as:  FLEXERIL Take 1 tablet (5 mg total) by mouth 3 (three) times daily as needed for muscle spasms.   diltiazem 300 MG 24 hr capsule Commonly known as:  CARDIZEM CD Take 1 capsule (300 mg total) by mouth daily.   ezetimibe 10 MG tablet Commonly known as:  ZETIA Take 1 tablet (10 mg total) by mouth daily.   fluticasone 50 MCG/ACT nasal spray Commonly known as:  FLONASE Place 1 spray into both nostrils 2 (two) times daily.   FLUTTER Devi Use 2-3 times per day   furosemide 20 MG tablet Commonly known as:  LASIX Take 1 tablet (20 mg total) by mouth daily. What changed:  how much to take  Another medication with the same name was removed. Continue taking this medication, and follow the directions you see here.   Lidocaine HCl 2 % Crea Apply 1 application topically daily as needed (for shingles flares).   LORazepam 0.5 MG tablet Commonly known as:  ATIVAN Take 1 tablet (0.5 mg total) by mouth 2 (two) times daily as needed for anxiety.   Magnesium 250 MG Tabs Take 1 tablet by mouth daily as needed. Leg cramps   Melatonin 1 MG Tabs Take 3 mg by mouth at bedtime.   meloxicam 15 MG tablet Commonly known as:  MOBIC Take 15 mg by mouth daily.   metoprolol succinate 25 MG 24 hr tablet Commonly known as:  TOPROL XL Take 1 tablet (25 mg total) by mouth daily.   multivitamin tablet Take 1 tablet by mouth daily.   MYRBETRIQ 50 MG Tb24 tablet Generic drug:  mirabegron ER Take 50 mg by mouth daily.   omeprazole 40 MG capsule Commonly known as:  PRILOSEC Take 1 capsule (40 mg total) by mouth  daily.   SINGULAIR 10 MG tablet Generic drug:  montelukast Take 10 mg by mouth daily.   Tiotropium Bromide Monohydrate 2.5 MCG/ACT Aers Commonly known as:  SPIRIVA RESPIMAT Inhale 2 puffs into the lungs daily.   vitamin E 400 UNIT capsule Take 400 Units by mouth daily.      Follow-up Information    Cathlean Cower, MD. Schedule an appointment as soon as possible for a visit in 1 week(s).   Specialties:  Internal Medicine, Radiology Contact information: [address] [place] [address] [phone]        Sherren Mocha, MD. Go on 08/19/2016.   Specialty:  Cardiology Why:  Richardson Dopp, PA at 11:45am Contact information: [address] [place] [address] [phone]          Allergies  Allergen Reactions  . Clarithromycin Other (See Comments)    REACTION: possible  rash but could have been legionarres dz with rash  . Lipitor [Atorvastatin] Other (See Comments)    MUSCLE SPASMS  . Simvastatin Other (See Comments)    Muscle and joint pain  . Crestor [Rosuvastatin Calcium]     fagitue and joint pain, "NO STATINS"    Consultations:  None   Procedures/Studies: X-ray Chest Pa And Lateral  Result Date: 08/10/2016 CLINICAL DATA:  Syncopal episode EXAM: CHEST  2 VIEW COMPARISON:  08/10/2016 FINDINGS: Cardiac shadow is stable. Hiatal hernia is better appreciated on the current examination. No focal infiltrate or sizable effusion is seen. Hyperinflation is noted consistent with COPD. No acute bony abnormality is noted. Postsurgical changes in the lumbar spine are noted. IMPRESSION: No acute abnormality noted. Electronically Signed   By: Inez Catalina M.D.   On: 08/10/2016 19:53   Dg Chest 2 View  Result Date: 07/28/2016 CLINICAL DATA:  Congestive heart failure. Shortness of breath for 3 weeks EXAM: CHEST  2 VIEW COMPARISON:  July 27, 2016 FINDINGS: There remains generalized interstitial prominence without airspace consolidation. There are  bilateral pleural effusions currently with cardiomegaly and pulmonary venous hypertension. No adenopathy. There is atherosclerotic calcification in the aorta. There is postoperative change in the upper lumbar spine. Bones appear osteoporotic. IMPRESSION: Evidence of a degree of congestive heart failure superimposed on interstitial fibrosis. No airspace consolidation. There is aortic atherosclerosis. Bones appear osteoporotic. Electronically Signed   By: Lowella Grip III M.D.   On: 07/28/2016 07:17   Dg Chest Port 1 View  Result Date: 08/10/2016 CLINICAL DATA:  Syncopal episode EXAM: PORTABLE CHEST 1 VIEW COMPARISON:  07/28/2016 FINDINGS: Cardiac shadow is mildly enlarged in size. The lungs are well aerated bilaterally without focal infiltrate or sizable effusion. Previously seen bibasilar changes have resolved in the interval. No bony abnormality is seen. IMPRESSION: No active disease. Electronically Signed   By: Inez Catalina M.D.   On: 08/10/2016 13:45   Dg Chest Portable 1 View  Result Date: 07/27/2016 CLINICAL DATA:  Shortness of Breath EXAM: PORTABLE CHEST 1 VIEW COMPARISON:  May 25, 2016 and March 22, 2016 FINDINGS: There is generalized interstitial prominence, likely due to underlying pulmonary fibrotic type change. There is no frank edema or consolidation. Heart is upper normal in size with pulmonary vascularity indicating a mild degree of pulmonary venous hypertension. No adenopathy. Hiatal hernia evident. There is atherosclerotic calcification in the aorta. No bone lesions. IMPRESSION: Chronic interstitial prominence, likely due to chronic fibrotic type change. A degree of mild superimposed interstitial edema cannot be entirely excluded. There is evidence suggesting a degree of underlying pulmonary vascular congestion. No airspace consolidation. There is a hiatal hernia. There is aortic atherosclerosis. Electronically Signed   By: Lowella Grip III M.D.   On: 07/27/2016 10:25   (Echo,  Carotid, EGD, Colonoscopy, ERCP)    Subjective:   Discharge Exam: Vitals:   08/11/16 0510 08/11/16 1144  BP: 119/67 (!) 103/56  Pulse: 90 65  Resp: 18   Temp: 98.7 F (37.1 C)    Vitals:   08/10/16 1943 08/10/16 1956 08/11/16 0510 08/11/16 1144  BP:  (!) 95/52 119/67 (!) 103/56  Pulse:  81 90 65  Resp:  18 18   Temp:  99 F (37.2 C) 98.7 F (37.1 C)   TempSrc:  Oral Oral   SpO2: 93% 95% 98%   Weight:   64.5 kg (142 lb 4.8 oz)   Height:        General: Pt is alert, awake, not  in acute distress Cardiovascular: RRR, S1/S2 +, no rubs, no gallops Respiratory: CTA bilaterally, no wheezing, no rhonchi Abdominal: Soft, NT, ND, bowel sounds + Extremities: no edema, no cyanosis Muscle strength 5/5 in all extremities   The results of significant diagnostics from this hospitalization (including imaging, microbiology, ancillary and laboratory) are listed below for reference.     Microbiology: No results found for this or any previous visit (from the past 240 hour(s)).   Labs: BNP (last 3 results)  Recent Labs  07/27/16 0952 08/10/16 1336  BNP 723.5* [id number]*   Basic Metabolic Panel:  Recent Labs Lab 08/09/16 1330 08/10/16 1336 08/10/16 1847 08/11/16 0751  NA 138 136  --  138  K 4.1 4.3  --  4.2  CL 104 98*  --  107  CO2 28 30  --  27  GLUCOSE 92 104*  --  95  BUN 44* 28*  --  20  CREATININE 0.99 1.03*  --  0.88  CALCIUM 8.9 8.7*  --  8.6*  MG  --   --  2.3  --   PHOS  --   --  3.5  --    Liver Function Tests:  Recent Labs Lab 08/10/16 1336  AST 23  ALT 19  ALKPHOS 62  BILITOT 0.7  PROT 5.4*  ALBUMIN 3.4*   No results for input(s): LIPASE, AMYLASE in the last 168 hours. No results for input(s): AMMONIA in the last 168 hours. CBC:  Recent Labs Lab 08/10/16 1336  WBC 10.2  HGB 12.6  HCT 38.9  MCV 80.2  PLT 151   Cardiac Enzymes:  Recent Labs Lab 08/10/16 1336  TROPONINI <0.03   BNP: Invalid input(s): POCBNP CBG:  Recent  Labs Lab 08/11/16 0547  GLUCAP 102*  Urinalysis    Component Value Date/Time   COLORURINE YELLOW 08/10/2016 1335   APPEARANCEUR CLOUDY (A) 08/10/2016 1335   LABSPEC 1.013 08/10/2016 1335   PHURINE 7.5 08/10/2016 1335   GLUCOSEU NEGATIVE 08/10/2016 1335   GLUCOSEU NEGATIVE 12/02/2014 1528   HGBUR NEGATIVE 08/10/2016 1335   BILIRUBINUR NEGATIVE 08/10/2016 1335   KETONESUR NEGATIVE 08/10/2016 1335   PROTEINUR NEGATIVE 08/10/2016 1335   UROBILINOGEN 0.2 07/08/2015 1015   NITRITE NEGATIVE 08/10/2016 1335   LEUKOCYTESUR TRACE (A) 08/10/2016 1335   Sepsis Labs Invalid input(s): PROCALCITONIN,  WBC,  LACTICIDVEN Microbiology No results found for this or any previous visit (from the past 240 hour(s)).   Time coordinating discharge: Over 30 minutes  SIGNED:   Rosita Fire, MD  [facility] 08/11/2016, 1:25 PM

## 2016-08-11 NOTE — Progress Notes (Signed)
Pt discharged to home with husband.  Pt verbalized understanding of discharge instructions and follow up care.  All belongings sent home with pt.  Education provided re: medications, diet, when to call MD and follow up care.  Danton Clap, RN

## 2016-08-11 NOTE — Addendum Note (Signed)
Encounter addended by: Sherran Needs, NP on: 08/11/2016  3:29 PM<BR>    Actions taken: Sign clinical note

## 2016-08-11 NOTE — Discharge Instructions (Signed)

## 2016-08-11 NOTE — Consult Note (Signed)
   [facility] CM Inpatient Consult   08/11/2016  COLITA FENECH 12-16-1940 [id number]   Patient is currently active with [facility] for chronic disease management services.  Patient has been engaged by a [employer]. Patient had discharged home with husband prior to seeing in the hospital.  [facility] was aware of admission and will continue to follow up.  For additional questions or referrals please contact:  Natividad Brood, RN BSN [facility] Liaison  [phone] business mobile phone Toll free office [phone]

## 2016-08-12 ENCOUNTER — Telehealth: Payer: Self-pay | Admitting: *Deleted

## 2016-08-12 ENCOUNTER — Other Ambulatory Visit: Payer: Self-pay

## 2016-08-12 NOTE — Patient Outreach (Signed)
Transition of care: Patient called and states that she was discharged on 08/11/2016 after being in the hospital for less than 24 hours. Reports that she was dehydrated from Lasix.  Patient reports today that she is feeling better.  States Blood pressure this am was 120/72.  States that she took her morning medications and now blood pressure is 101/56. Patient reports that she is moving around slowly.  Denies any rapid heart rate.   PLAN:  Home visit planned for 08/16/2016 and patient needs to move this appointment to 08/18/2016.  Agreed to move appointment.  Reviewed with patient the need to stay hydrated and the need to change positions slowly. She voiced understanding. Encouraged patient to call MD for any concerns or low blood pressure.  Tomasa Rand, RN, BSN, CEN [facility] [employer] [phone]

## 2016-08-12 NOTE — Telephone Encounter (Signed)
Transition Care Management Follow-up Telephone Call   Date discharged? 08/11/16   How have you been since you were released from the hospital? Pt states she seem to be feeling alright   Do you understand why you were in the hospital? YES   Do you understand the discharge instructions? YES   Where were you discharged to? Home   Items Reviewed:  Medications reviewed: YES  Allergies reviewed: YES  Dietary changes reviewed: YES  Referrals reviewed: No referral needed   Functional Questionnaire:   Activities of Daily Living (ADLs):   She states she are independent in the following: ambulation, bathing and hygiene, feeding, continence, grooming, toileting and dressing States she doesn't require assistance    Any transportation issues/concerns?: NO   Any patient concerns? No   Confirmed importance and date/time of follow-up visits scheduled YES, appt made 08/17/16  Provider Appointment booked with Dr. Jenny Reichmann  Confirmed with patient if condition begins to worsen call PCP or go to the ER.  Patient was given the office number and encouraged to call back with question or concerns.  : YES

## 2016-08-15 ENCOUNTER — Telehealth (HOSPITAL_COMMUNITY): Payer: Self-pay | Admitting: *Deleted

## 2016-08-15 ENCOUNTER — Telehealth: Payer: Self-pay | Admitting: Physician Assistant

## 2016-08-15 NOTE — Telephone Encounter (Signed)
New Message:     Any time she gets up and move,her oxygen level drops to 80,her blood pressure is also running low.Please call to advise asap.

## 2016-08-15 NOTE — Telephone Encounter (Signed)
Patient given detailed instructions per Myocardial Perfusion Study Information Sheet for the test on  08/17/16. Patient notified to arrive 15 minutes early and that it is imperative to arrive on time for appointment to keep from having the test rescheduled.  If you need to cancel or reschedule your appointment, please call the office within 24 hours of your appointment. Failure to do so may result in a cancellation of your appointment, and a $50 no show fee. Patient verbalized understanding. Athina Fahey Jacqueline   

## 2016-08-15 NOTE — Progress Notes (Signed)
Pt is scheduled with Richardson Dopp, PA-C on Friday, 08/19/16.

## 2016-08-15 NOTE — Telephone Encounter (Signed)
Pt reporting no energy since d/c from hospital on Friday.  Reports BPs this morning as 112/64 and 106/56.  Denies dizziness or other symptoms w/ low BP.  States that she has been changing position slowly when going from sitting to standing -- this has helped. She denies weight gain since hospital discharge. She sees PCP Wednesday afternoon for follow up.  She also reports SOB.  This unfortunately not a new issue and she has COPD (she sees pulmonary doctor next week).  In hospital at end of last month with acute respiratory failure, bronchitis, COPD exacerbation along with a multitude of other issues.  Today she mentioned pulse ox dropping to 89% w/ ambulation.  States that it went back to > 90-92% upon rest/sitting.  We discussed how this is normal with lung condition and continuing to monitor.  Patient advised to continue to monitor and keep currently scheduled OVs.  She is agreeable to above stated plan.

## 2016-08-16 ENCOUNTER — Ambulatory Visit: Payer: Self-pay

## 2016-08-17 ENCOUNTER — Emergency Department (HOSPITAL_COMMUNITY): Payer: Medicare Other

## 2016-08-17 ENCOUNTER — Encounter: Payer: Self-pay | Admitting: Physician Assistant

## 2016-08-17 ENCOUNTER — Other Ambulatory Visit: Payer: Self-pay

## 2016-08-17 ENCOUNTER — Other Ambulatory Visit (HOSPITAL_COMMUNITY): Payer: Self-pay

## 2016-08-17 ENCOUNTER — Ambulatory Visit (HOSPITAL_COMMUNITY): Payer: Medicare Other

## 2016-08-17 ENCOUNTER — Telehealth: Payer: Self-pay | Admitting: Pulmonary Disease

## 2016-08-17 ENCOUNTER — Encounter (HOSPITAL_COMMUNITY): Payer: Self-pay

## 2016-08-17 ENCOUNTER — Inpatient Hospital Stay (HOSPITAL_COMMUNITY)
Admission: EM | Admit: 2016-08-17 | Discharge: 2016-08-23 | DRG: 286 | Disposition: A | Discharge status: Home / Self Care | Payer: Medicare Other | Attending: Family Medicine | Admitting: Family Medicine

## 2016-08-17 ENCOUNTER — Inpatient Hospital Stay: Payer: Medicare Other | Admitting: Internal Medicine

## 2016-08-17 DIAGNOSIS — Z9842 Cataract extraction status, left eye: Secondary | ICD-10-CM

## 2016-08-17 DIAGNOSIS — D649 Anemia, unspecified: Secondary | ICD-10-CM | POA: Diagnosis present

## 2016-08-17 DIAGNOSIS — R0602 Shortness of breath: Secondary | ICD-10-CM | POA: Diagnosis not present

## 2016-08-17 DIAGNOSIS — R0600 Dyspnea, unspecified: Secondary | ICD-10-CM | POA: Diagnosis not present

## 2016-08-17 DIAGNOSIS — Z85828 Personal history of other malignant neoplasm of skin: Secondary | ICD-10-CM

## 2016-08-17 DIAGNOSIS — I4891 Unspecified atrial fibrillation: Secondary | ICD-10-CM | POA: Diagnosis present

## 2016-08-17 DIAGNOSIS — Z79899 Other long term (current) drug therapy: Secondary | ICD-10-CM

## 2016-08-17 DIAGNOSIS — I480 Paroxysmal atrial fibrillation: Secondary | ICD-10-CM | POA: Diagnosis present

## 2016-08-17 DIAGNOSIS — I5043 Acute on chronic combined systolic (congestive) and diastolic (congestive) heart failure: Secondary | ICD-10-CM | POA: Diagnosis present

## 2016-08-17 DIAGNOSIS — R042 Hemoptysis: Secondary | ICD-10-CM | POA: Diagnosis not present

## 2016-08-17 DIAGNOSIS — I509 Heart failure, unspecified: Secondary | ICD-10-CM | POA: Diagnosis not present

## 2016-08-17 DIAGNOSIS — I482 Chronic atrial fibrillation, unspecified: Secondary | ICD-10-CM

## 2016-08-17 DIAGNOSIS — E785 Hyperlipidemia, unspecified: Secondary | ICD-10-CM | POA: Diagnosis present

## 2016-08-17 DIAGNOSIS — J9621 Acute and chronic respiratory failure with hypoxia: Secondary | ICD-10-CM | POA: Diagnosis present

## 2016-08-17 DIAGNOSIS — Z981 Arthrodesis status: Secondary | ICD-10-CM

## 2016-08-17 DIAGNOSIS — Z8673 Personal history of transient ischemic attack (TIA), and cerebral infarction without residual deficits: Secondary | ICD-10-CM | POA: Insufficient documentation

## 2016-08-17 DIAGNOSIS — Z87891 Personal history of nicotine dependence: Secondary | ICD-10-CM

## 2016-08-17 DIAGNOSIS — Z7901 Long term (current) use of anticoagulants: Secondary | ICD-10-CM

## 2016-08-17 DIAGNOSIS — J441 Chronic obstructive pulmonary disease with (acute) exacerbation: Secondary | ICD-10-CM | POA: Diagnosis not present

## 2016-08-17 DIAGNOSIS — F419 Anxiety disorder, unspecified: Secondary | ICD-10-CM | POA: Diagnosis present

## 2016-08-17 DIAGNOSIS — I110 Hypertensive heart disease with heart failure: Secondary | ICD-10-CM | POA: Diagnosis not present

## 2016-08-17 DIAGNOSIS — M1990 Unspecified osteoarthritis, unspecified site: Secondary | ICD-10-CM | POA: Diagnosis present

## 2016-08-17 DIAGNOSIS — J449 Chronic obstructive pulmonary disease, unspecified: Secondary | ICD-10-CM | POA: Diagnosis present

## 2016-08-17 DIAGNOSIS — N179 Acute kidney failure, unspecified: Secondary | ICD-10-CM | POA: Diagnosis present

## 2016-08-17 DIAGNOSIS — Z23 Encounter for immunization: Secondary | ICD-10-CM

## 2016-08-17 DIAGNOSIS — Z9841 Cataract extraction status, right eye: Secondary | ICD-10-CM

## 2016-08-17 DIAGNOSIS — F329 Major depressive disorder, single episode, unspecified: Secondary | ICD-10-CM | POA: Diagnosis present

## 2016-08-17 DIAGNOSIS — K219 Gastro-esophageal reflux disease without esophagitis: Secondary | ICD-10-CM | POA: Diagnosis present

## 2016-08-17 LAB — I-STAT TROPONIN, ED: Troponin i, poc: 0 ng/mL (ref 0.00–0.08)

## 2016-08-17 LAB — BASIC METABOLIC PANEL
Anion gap: 11 (ref 5–15)
BUN: 17 mg/dL (ref 6–20)
CO2: 19 mmol/L — ABNORMAL LOW (ref 22–32)
Calcium: 8.9 mg/dL (ref 8.9–10.3)
Chloride: 103 mmol/L (ref 101–111)
Creatinine, Ser: 1.11 mg/dL — ABNORMAL HIGH (ref 0.44–1.00)
GFR calc Af Amer: 54 mL/min — ABNORMAL LOW (ref >60)
GFR calc non Af Amer: 47 mL/min — ABNORMAL LOW (ref >60)
Glucose, Bld: 82 mg/dL (ref 65–99)
Potassium: 4.5 mmol/L (ref 3.5–5.1)
Sodium: 133 mmol/L — ABNORMAL LOW (ref 135–145)

## 2016-08-17 LAB — PROTIME-INR
INR: 1.21
Prothrombin Time: 15.4 seconds — ABNORMAL HIGH (ref 11.4–15.2)

## 2016-08-17 LAB — BRAIN NATRIURETIC PEPTIDE: B Natriuretic Peptide: 343.5 pg/mL — ABNORMAL HIGH (ref 0.0–100.0)

## 2016-08-17 LAB — CBC
HCT: 34.6 % — ABNORMAL LOW (ref 36.0–46.0)
Hemoglobin: 11 g/dL — ABNORMAL LOW (ref 12.0–15.0)
MCH: 25.7 pg — ABNORMAL LOW (ref 26.0–34.0)
MCHC: 31.8 g/dL (ref 30.0–36.0)
MCV: 80.8 fL (ref 78.0–100.0)
Platelets: 181 10*3/uL (ref 150–400)
RBC: 4.28 MIL/uL (ref 3.87–5.11)
RDW: 16.9 % — ABNORMAL HIGH (ref 11.5–15.5)
WBC: 6 10*3/uL (ref 4.0–10.5)

## 2016-08-17 LAB — TROPONIN I: Troponin I: <0.03 ng/mL (ref <0.03)

## 2016-08-17 MED ORDER — ACETAMINOPHEN 650 MG RE SUPP: 650.0000 mg | Freq: Four times a day (QID) | RECTAL | Status: DC | PRN

## 2016-08-17 MED ORDER — METOPROLOL SUCCINATE ER 25 MG PO TB24
25.0000 mg | ORAL_TABLET | Freq: Every day | ORAL | Status: DC
  Administered 2016-08-18: 25 mg via ORAL
  Filled 2016-08-17: qty 1

## 2016-08-17 MED ORDER — FUROSEMIDE 10 MG/ML IJ SOLN
40.0000 mg | Freq: Once | INTRAMUSCULAR | Status: AC
  Administered 2016-08-17: 40 mg via INTRAVENOUS
  Filled 2016-08-17: qty 4

## 2016-08-17 MED ORDER — MOMETASONE FURO-FORMOTEROL FUM 200-5 MCG/ACT IN AERO
2.0000 | INHALATION_SPRAY | Freq: Two times a day (BID) | RESPIRATORY_TRACT | Status: DC
  Administered 2016-08-18 – 2016-08-23 (×9): 2 via RESPIRATORY_TRACT
  Filled 2016-08-17 (×4): qty 8.8

## 2016-08-17 MED ORDER — CYCLOBENZAPRINE HCL 5 MG PO TABS
5.0000 mg | ORAL_TABLET | Freq: Three times a day (TID) | ORAL | Status: DC | PRN
  Administered 2016-08-18 – 2016-08-22 (×5): 5 mg via ORAL
  Filled 2016-08-17 (×5): qty 1

## 2016-08-17 MED ORDER — FUROSEMIDE 10 MG/ML IJ SOLN
40.0000 mg | Freq: Every day | INTRAMUSCULAR | Status: DC
  Administered 2016-08-18: 40 mg via INTRAVENOUS
  Filled 2016-08-17 (×2): qty 4

## 2016-08-17 MED ORDER — DILTIAZEM HCL ER COATED BEADS 120 MG PO CP24
300.0000 mg | ORAL_CAPSULE | Freq: Every day | ORAL | Status: DC
  Administered 2016-08-18 – 2016-08-23 (×6): 300 mg via ORAL
  Filled 2016-08-17 (×6): qty 1

## 2016-08-17 MED ORDER — FLUTICASONE PROPIONATE 50 MCG/ACT NA SUSP
1.0000 | Freq: Two times a day (BID) | NASAL | Status: DC
  Administered 2016-08-18 – 2016-08-23 (×12): 1 via NASAL
  Filled 2016-08-17: qty 16

## 2016-08-17 MED ORDER — BENZONATATE 100 MG PO CAPS: 100.0000 mg | ORAL_CAPSULE | Freq: Three times a day (TID) | ORAL | Status: DC | PRN

## 2016-08-17 MED ORDER — CITALOPRAM HYDROBROMIDE 20 MG PO TABS
20.0000 mg | ORAL_TABLET | Freq: Every day | ORAL | Status: DC
  Administered 2016-08-18 – 2016-08-23 (×6): 20 mg via ORAL
  Filled 2016-08-17 (×7): qty 1

## 2016-08-17 MED ORDER — ONDANSETRON HCL 4 MG/2ML IJ SOLN
4.0000 mg | Freq: Four times a day (QID) | INTRAMUSCULAR | Status: DC | PRN
Start: 2016-08-17 — End: 2016-08-23

## 2016-08-17 MED ORDER — ADULT MULTIVITAMIN W/MINERALS CH
1.0000 | ORAL_TABLET | Freq: Every day | ORAL | Status: DC
  Administered 2016-08-18 – 2016-08-23 (×6): 1 via ORAL
  Filled 2016-08-17 (×6): qty 1

## 2016-08-17 MED ORDER — GUAIFENESIN ER 600 MG PO TB12: 1200.0000 mg | ORAL_TABLET | Freq: Two times a day (BID) | ORAL | Status: DC | PRN

## 2016-08-17 MED ORDER — EZETIMIBE 10 MG PO TABS
10.0000 mg | ORAL_TABLET | Freq: Every day | ORAL | Status: DC
  Administered 2016-08-18 – 2016-08-23 (×6): 10 mg via ORAL
  Filled 2016-08-17 (×6): qty 1

## 2016-08-17 MED ORDER — MIRABEGRON ER 25 MG PO TB24
50.0000 mg | ORAL_TABLET | Freq: Every day | ORAL | Status: DC
  Administered 2016-08-18 – 2016-08-22 (×5): 50 mg via ORAL
  Filled 2016-08-17 (×5): qty 2
  Filled 2016-08-17: qty 1
  Filled 2016-08-17: qty 2

## 2016-08-17 MED ORDER — VITAMIN E 180 MG (400 UNIT) PO CAPS
400.0000 [IU] | ORAL_CAPSULE | Freq: Every day | ORAL | Status: DC
  Administered 2016-08-18 – 2016-08-23 (×6): 400 [IU] via ORAL
  Filled 2016-08-17 (×6): qty 1

## 2016-08-17 MED ORDER — LORAZEPAM 0.5 MG PO TABS
0.5000 mg | ORAL_TABLET | Freq: Two times a day (BID) | ORAL | Status: DC | PRN
  Administered 2016-08-21: 0.5 mg via ORAL
  Filled 2016-08-17: qty 1

## 2016-08-17 MED ORDER — IOPAMIDOL (ISOVUE-370) INJECTION 76%
INTRAVENOUS | Status: AC
  Administered 2016-08-17: 100 mL
  Filled 2016-08-17: qty 100

## 2016-08-17 MED ORDER — TECHNETIUM TC 99M TETROFOSMIN IV KIT
30.3000 | PACK | Freq: Once | INTRAVENOUS | Status: AC | PRN
  Administered 2016-08-17: 30.3 via INTRAVENOUS
  Filled 2016-08-17: qty 30

## 2016-08-17 MED ORDER — ACETAMINOPHEN 325 MG PO TABS: 650.0000 mg | ORAL_TABLET | Freq: Four times a day (QID) | ORAL | Status: DC | PRN

## 2016-08-17 MED ORDER — MELATONIN 3 MG PO TABS
3.0000 mg | ORAL_TABLET | Freq: Every day | ORAL | Status: DC
  Administered 2016-08-18 – 2016-08-22 (×6): 3 mg via ORAL
  Filled 2016-08-17 (×8): qty 1

## 2016-08-17 MED ORDER — AZELASTINE HCL 0.1 % NA SOLN
2.0000 | Freq: Every day | NASAL | Status: DC
  Administered 2016-08-18 – 2016-08-23 (×6): 2 via NASAL
  Filled 2016-08-17: qty 30

## 2016-08-17 MED ORDER — LIDOCAINE HCL 2 % EX CREA: 1.0000 "application " | TOPICAL_CREAM | Freq: Every day | CUTANEOUS | Status: DC | PRN

## 2016-08-17 MED ORDER — ALBUTEROL SULFATE (2.5 MG/3ML) 0.083% IN NEBU: 3.0000 mL | INHALATION_SOLUTION | Freq: Four times a day (QID) | RESPIRATORY_TRACT | Status: DC | PRN

## 2016-08-17 MED ORDER — ONDANSETRON HCL 4 MG PO TABS: 4.0000 mg | ORAL_TABLET | Freq: Four times a day (QID) | ORAL | Status: DC | PRN

## 2016-08-17 MED ORDER — MONTELUKAST SODIUM 10 MG PO TABS
10.0000 mg | ORAL_TABLET | Freq: Every day | ORAL | Status: DC
  Administered 2016-08-18 – 2016-08-23 (×6): 10 mg via ORAL
  Filled 2016-08-17 (×6): qty 1

## 2016-08-17 MED ORDER — PANTOPRAZOLE SODIUM 40 MG PO TBEC
40.0000 mg | DELAYED_RELEASE_TABLET | Freq: Every day | ORAL | Status: DC
  Administered 2016-08-18 – 2016-08-23 (×6): 40 mg via ORAL
  Filled 2016-08-17 (×6): qty 1

## 2016-08-17 MED ORDER — TIOTROPIUM BROMIDE MONOHYDRATE 18 MCG IN CAPS
1.0000 | ORAL_CAPSULE | Freq: Every day | RESPIRATORY_TRACT | Status: DC
  Administered 2016-08-18: 18 ug via RESPIRATORY_TRACT
  Filled 2016-08-17: qty 5

## 2016-08-17 NOTE — ED Notes (Signed)
Ambulated in hall while checking O2 sat.  Noted to drop from 96% to 90%.  On arrival back to the room noted to be very dyspneic with sat dropping to 83%.  Oxygen reapplied at 2L at this time.  Physician made aware.

## 2016-08-17 NOTE — Telephone Encounter (Signed)
Spoke with pt and she c/o continued cough with "rust" colored blood clots and increasing ShOB. Pt did have resting portion of stress test this morning. Pt is also going to see PCP at 3:30pm. Pt reports that her O2 sats are low 85-87% on room air and with exertion. Pt reports that with rest it does increase to 92%. Pt is concerned about the blood clots and low O2.   JN - Please advise. Thanks!

## 2016-08-17 NOTE — ED Notes (Signed)
Noted to have increased sob with using bedpan.  O2 applied at 2L for comfort.  Sat rebounded to 96%.  To continue to monitor.

## 2016-08-17 NOTE — Progress Notes (Signed)
Pt a/o, no c/o pain, DOE pt on 2L sats WNL, pt oriented to unit, VAA, pt stable

## 2016-08-17 NOTE — H&P (Signed)
History and Physical    [name] [id number] DOB: January 08, 1941 DOA: 08/17/2016  PCP: Cathlean Cower, MD  Patient coming from: Home.  Chief Complaint: Shortness of breath and hemoptysis.  HPI: [name] is a 75 y.o. female with chronic atrial fibrillation, COPD, combined systolic and diastolic CHF who was admitted last month for A. fib with RVR and respiratory failure following which patient was admitted again for syncope secondary to overdiuresis and patient's Lasix dose was decreased and Avapro discontinued presents with complaints of increasing shortness of breath last few days on exertion and also has noticed some hemoptysis today. Patient states shortness of breath is mostly on exertion and has been having some productive cough. Patient also noticed some swelling in the lower extremity. Today patient had stress tests as outpatient with cardiology and has the second part due for tomorrow. Later patient coughed up 2 small clots and has not had any further hemoptysis in the ER. CT angiogram of the chest was done which shows - ill-defined groundglass opacities in right greater than left. Differentials include pulmonary edema versus pneumonitis. Patient was given Lasix in the ER and admitted for further management.    ED Course: Lasix 40 mg IV was given in the ER. CT angiogram of the chest was negative for PE showed some ill-defined opacities in the right upper lobe.  Review of Systems: As per HPI, rest all negative.   Past Medical History:  Diagnosis Date  . Allergic rhinitis   . Allergy   . Anxiety   . Basal cell carcinoma   . Cataract    bil cataracts removed  . COPD (chronic obstructive pulmonary disease) ([place])   . Depression 11/29/2013  . DI (detrusor instability)   . Dysrhythmia   . Esophageal stricture   . GERD (gastroesophageal reflux disease)   . Hemorrhoids   . Hiatal hernia   . History of cerebrovascular accident   . History of kidney stones   . HLD  (hyperlipidemia)   . HTN (hypertension)   . Nephrolithiasis    hx  . Osteoarthritis   . Pneumonia 2013  . Shingles since Nov 18, 2013   on right arm from shoulder to wrist  . Shortness of breath dyspnea    WITH EXERTION   . Status post dilation of esophageal narrowing   . Stroke [facility]) 2001   STROKES, TIA'S  . TIA (transient ischemic attack) last 2001   anticoagulation therapy on coumadin    Past Surgical History:  Procedure Laterality Date  . ANTERIOR AND POSTERIOR VAGINAL REPAIR  2008   A&P REPAIR-DR MCDIARMID  . ANTERIOR LATERAL LUMBAR FUSION 4 LEVELS Right 07/03/2015   Procedure: Right Lumbar one-two, Lumbar two-three, Lumbar three-four, Lumbar four-five Anterior lateral lumbar interbody fusion;  Surgeon: Erline Levine, MD;  Location: [facility];  Service: Neurosurgery;  Laterality: Right;  Right L1-2 L2-3 L3-4 L4-5 Anterior lateral lumbar interbody fusion  . BALLOON DILATION N/A 03/03/2014   Procedure: BALLOON DILATION;  Surgeon: Inda Castle, MD;  Location: [facility];  Service: Endoscopy;  Laterality: N/A;  . BLADDER SUSPENSION    . CARDIOVERSION N/A 08/01/2016   Procedure: CARDIOVERSION;  Surgeon: Jerline Pain, MD;  Location: [facility];  Service: Cardiovascular;  Laterality: N/A;  . COLONOSCOPY    . ESOPHAGOGASTRODUODENOSCOPY N/A 03/03/2014   Procedure: ESOPHAGOGASTRODUODENOSCOPY (EGD);  Surgeon: Inda Castle, MD;  Location: [facility];  Service: Endoscopy;  Laterality: N/A;  . EXCISION OF BASAL CELL CA  2011  .  GLUTEUS MINIMUS REPAIR Right 03/15/2016   Procedure: RIGHT HIP GLUTEUS MEDIUS TENDON REPAIR;  Surgeon: Paralee Cancel, MD;  Location: [facility];  Service: Orthopedics;  Laterality: Right;  . LUMBAR PERCUTANEOUS PEDICLE SCREW 4 LEVEL Right 07/03/2015   Procedure: Lumbar one-Sacral one Bilateral percutaneous pedicle screws;  Surgeon: Erline Levine, MD;  Location: [facility];  Service: Neurosurgery;  Laterality: Right;  L1-S1 Bilateral percutaneous pedicle screws    . RECTOCELE REPAIR  2008   A&P REPAIR -DR. MCDIARMID  . THUMB SURGERY Right 10-15 yrs ago  . UPPER GASTROINTESTINAL ENDOSCOPY    . VAGINAL HYSTERECTOMY  1983   NO BSO-DR. MABRY  . VARICOSE VEIN SURGERY       reports that she quit smoking about 20 years ago. Her smoking use included Cigarettes. She started smoking about 62 years ago. She has a 126.00 pack-year smoking history. She has never used smokeless tobacco. She reports that she drinks alcohol. She reports that she does not use drugs.  Allergies  Allergen Reactions  . Clarithromycin Other (See Comments)    REACTION: possible rash but could have been legionarres dz with rash  . Lipitor [Atorvastatin] Other (See Comments)    MUSCLE SPASMS  . Simvastatin Other (See Comments)    Muscle and joint pain  . Crestor [Rosuvastatin Calcium]     fagitue and joint pain, "NO STATINS"    Family History  Problem Relation Age of Onset  . COPD Mother   . Breast cancer Mother     mid 80's  . Uterine cancer Mother   . Emphysema Mother   . Breast cancer Maternal Aunt     x 3 aunts, post menopausal  . Colon cancer Neg Hx   . Esophageal cancer Neg Hx   . Rectal cancer Neg Hx   . Stomach cancer Neg Hx   . Pancreatic cancer Neg Hx     Prior to Admission medications   Medication Sig Start Date End Date Taking? Authorizing Provider  albuterol (PROVENTIL HFA;VENTOLIN HFA) 108 (90 BASE) MCG/ACT inhaler Inhale 1-2 puffs into the lungs every 6 (six) hours as needed for wheezing or shortness of breath. 01/06/15  Yes Elsie Stain, MD  apixaban (ELIQUIS) 5 MG TABS tablet Take 1 tablet (5 mg total) by mouth 2 (two) times daily. 08/02/16  Yes Eugenie Filler, MD  azelastine (ASTELIN) 0.1 % nasal spray Place 2 sprays into the nose daily. Use in each nostril as directed Patient taking differently: Place 2 sprays into both nostrils daily.  03/01/16  Yes Javier Glazier, MD  benzonatate (TESSALON) 100 MG capsule Take 1 capsule (100 mg total) by mouth  3 (three) times daily as needed for cough. 03/22/16  Yes Tammy S Parrett, NP  budesonide-formoterol (SYMBICORT) 160-4.5 MCG/ACT inhaler Inhale 2 puffs into the lungs 2 (two) times daily. 03/01/16  Yes Javier Glazier, MD  Calcium Carbonate-Vitamin D (CALCIUM + D PO) Take 1 tablet by mouth 2 (two) times daily.    Yes Historical Provider, MD  citalopram (CELEXA) 20 MG tablet Take 1 tablet (20 mg total) by mouth daily. 11/12/15  Yes Biagio Borg, MD  cyclobenzaprine (FLEXERIL) 5 MG tablet Take 1 tablet (5 mg total) by mouth 3 (three) times daily as needed for muscle spasms. 08/19/15  Yes Biagio Borg, MD  diltiazem (CARDIZEM CD) 300 MG 24 hr capsule Take 1 capsule (300 mg total) by mouth daily. 08/02/16  Yes Eugenie Filler, MD  ezetimibe (ZETIA) 10 MG tablet  Take 1 tablet (10 mg total) by mouth daily. 07/26/16 10/24/16 Yes Fay Records, MD  fluticasone (FLONASE) 50 MCG/ACT nasal spray Place 1 spray into both nostrils 2 (two) times daily. 12/22/15  Yes Javier Glazier, MD  furosemide (LASIX) 20 MG tablet Take 1 tablet (20 mg total) by mouth daily. Patient taking differently: Take 20 mg by mouth every morning.  08/02/16  Yes Eugenie Filler, MD  guaiFENesin (MUCINEX) 600 MG 12 hr tablet Take 1,200 mg by mouth 2 (two) times daily as needed for cough or to loosen phlegm.   Yes Historical Provider, MD  Lidocaine HCl 2 % CREA Apply 1 application topically daily as needed (for shingles flares).   Yes Historical Provider, MD  LORazepam (ATIVAN) 0.5 MG tablet Take 1 tablet (0.5 mg total) by mouth 2 (two) times daily as needed for anxiety. 08/02/16  Yes Eugenie Filler, MD  Magnesium 250 MG TABS Take 1 tablet by mouth daily as needed. Leg cramps   Yes Historical Provider, MD  Melatonin 1 MG TABS Take 3 mg by mouth at bedtime.    Yes Historical Provider, MD  meloxicam (MOBIC) 15 MG tablet Take 15 mg by mouth daily.   Yes Historical Provider, MD  metoprolol succinate (TOPROL XL) 25 MG 24 hr tablet Take 1 tablet  (25 mg total) by mouth daily. 08/05/16  Yes Fay Records, MD  mirabegron ER (MYRBETRIQ) 50 MG TB24 tablet Take 50 mg by mouth daily.   Yes Historical Provider, MD  montelukast (SINGULAIR) 10 MG tablet Take 10 mg by mouth daily.   Yes Historical Provider, MD  Multiple Vitamin (MULTIVITAMIN) tablet Take 1 tablet by mouth daily.     Yes Historical Provider, MD  omeprazole (PRILOSEC) 40 MG capsule Take 1 capsule (40 mg total) by mouth daily. 04/22/16  Yes Irene Shipper, MD  Respiratory Therapy Supplies (FLUTTER) DEVI Use 2-3 times per day 01/06/15  Yes Elsie Stain, MD  Spacer/Aero-Holding Chambers (AEROCHAMBER Z-STAT PLUS) inhaler Use as instructed 12/22/15  Yes Javier Glazier, MD  Tiotropium Bromide Monohydrate (SPIRIVA RESPIMAT) 2.5 MCG/ACT AERS Inhale 2 puffs into the lungs daily. 02/19/16  Yes Javier Glazier, MD  vitamin E 400 UNIT capsule Take 400 Units by mouth daily.   Yes Historical Provider, MD    Physical Exam: Vitals:   08/17/16 2130 08/17/16 2215 08/17/16 2231 08/17/16 2242  BP: 126/65 129/80  (!) 125/57  Pulse: 79 (!) 55  91  Resp: 24 (!) 27  20  Temp:   97.8 F (36.6 C) 98.4 F (36.9 C)  TempSrc:   Oral Oral  SpO2: 97% 90%  93%  Weight:    142 lb 6.4 oz (64.6 kg)  Height:    5\' 4"  (1.626 m)      Constitutional: Not in distress. Vitals:   08/17/16 2130 08/17/16 2215 08/17/16 2231 08/17/16 2242  BP: 126/65 129/80  (!) 125/57  Pulse: 79 (!) 55  91  Resp: 24 (!) 27  20  Temp:   97.8 F (36.6 C) 98.4 F (36.9 C)  TempSrc:   Oral Oral  SpO2: 97% 90%  93%  Weight:    142 lb 6.4 oz (64.6 kg)  Height:    5\' 4"  (1.626 m)   Eyes: Anicteric no pallor. ENMT: No discharge from the ears eyes nose or mouth. Neck: JVD mildly elevated. No mass felt. Respiratory: No rhonchi or crepitations. Cardiovascular: S1 and S2 heard. Abdomen: Soft nontender bowel sounds present.  No guarding or rigidity. Musculoskeletal: Mild edema. Skin: No rash. Neurologic: Alert awake oriented to  time place and person. Moves all extremities. Psychiatric: Appears normal.   Labs on Admission: I have personally reviewed following labs and imaging studies  CBC:  Recent Labs Lab 08/17/16 1448  WBC 6.0  HGB 11.0*  HCT 34.6*  MCV 80.8  PLT 0000000   Basic Metabolic Panel:  Recent Labs Lab 08/11/16 0751 08/17/16 1448  NA 138 133*  K 4.2 4.5  CL 107 103  CO2 27 19*  GLUCOSE 95 82  BUN 20 17  CREATININE 0.88 1.11*  CALCIUM 8.6* 8.9   GFR: Estimated Creatinine Clearance: 36.7 mL/min (by C-G formula based on SCr of 1.11 mg/dL (H)). Liver Function Tests: No results for input(s): AST, ALT, ALKPHOS, BILITOT, PROT, ALBUMIN in the last 168 hours. No results for input(s): LIPASE, AMYLASE in the last 168 hours. No results for input(s): AMMONIA in the last 168 hours. Coagulation Profile:  Recent Labs Lab 08/17/16 1448  INR 1.21   Cardiac Enzymes:  Recent Labs Lab 08/17/16 1926  TROPONINI <0.03   BNP (last 3 results) No results for input(s): PROBNP in the last 8760 hours. HbA1C: No results for input(s): HGBA1C in the last 72 hours. CBG:  Recent Labs Lab 08/11/16 0547  GLUCAP 102*   Lipid Profile: No results for input(s): CHOL, HDL, LDLCALC, TRIG, CHOLHDL, LDLDIRECT in the last 72 hours. Thyroid Function Tests: No results for input(s): TSH, T4TOTAL, FREET4, T3FREE, THYROIDAB in the last 72 hours. Anemia Panel: No results for input(s): VITAMINB12, FOLATE, FERRITIN, TIBC, IRON, RETICCTPCT in the last 72 hours. Urine analysis:    Component Value Date/Time   COLORURINE YELLOW 08/10/2016 1335   APPEARANCEUR CLOUDY (A) 08/10/2016 1335   LABSPEC 1.013 08/10/2016 1335   PHURINE 7.5 08/10/2016 1335   GLUCOSEU NEGATIVE 08/10/2016 1335   GLUCOSEU NEGATIVE 12/02/2014 1528   HGBUR NEGATIVE 08/10/2016 1335   BILIRUBINUR NEGATIVE 08/10/2016 1335   KETONESUR NEGATIVE 08/10/2016 1335   PROTEINUR NEGATIVE 08/10/2016 1335   UROBILINOGEN 0.2 07/08/2015 1015   NITRITE  NEGATIVE 08/10/2016 1335   LEUKOCYTESUR TRACE (A) 08/10/2016 1335   Sepsis Labs: @LABRCNTIP (procalcitonin:4,lacticidven:4) )No results found for this or any previous visit (from the past 240 hour(s)).   Radiological Exams on Admission: Dg Chest 2 View  Result Date: 08/17/2016 CLINICAL DATA:  Shortness of breath, chest heaviness and pain with weakness for the past week. Bloody sputum today. History of COPD CVA, CHF. EXAM: CHEST  2 VIEW COMPARISON:  PA and lateral chest x-ray of August 10, 2016 FINDINGS: The lungs remain hyperinflated. The interstitial markings remain increased overall. There is no alveolar infiltrate or pleural effusion. The heart and pulmonary vascularity are normal. There is a hiatal hernia. There is calcification in the wall of the aortic arch. There is mild multilevel degenerative disc disease of the thoracic spine. IMPRESSION: COPD with chronic pulmonary fibrosis. No alveolar pneumonia nor CHF. Aortic atherosclerosis. Electronically Signed   By: David  Martinique M.D.   On: 08/17/2016 15:55   Ct Angio Chest Pe W And/or Wo Contrast  Result Date: 08/17/2016 CLINICAL DATA:  Hemoptysis. Increased shortness of breath with exertion. Cough for 1 week. EXAM: CT ANGIOGRAPHY CHEST WITH CONTRAST TECHNIQUE: Multidetector CT imaging of the chest was performed using the standard protocol during bolus administration of intravenous contrast. Multiplanar CT image reconstructions and MIPs were obtained to evaluate the vascular anatomy. CONTRAST:  100 cc Isovue 370 IV COMPARISON:  Radiographs earlier this day.  Chest CT 12/19/2013 FINDINGS: Cardiovascular: There are no filling defects within the pulmonary arteries to suggest pulmonary embolus. The thoracic aorta is normal in caliber without dissection. There is moderate atherosclerosis without aneurysm. There is a conventional branching pattern from the aortic arch. Coronary artery calcifications are seen. Mediastinum/Nodes: Minimal ill-defined hilar  soft tissue density likely lymphoid tissue, and discrete no wounds not seen. No mediastinal adenopathy. No pericardial effusion. Lungs/Pleura: Advanced emphysema, this is progressed from chest CT 2 and half years ago. Ill-defined ground-glass opacities throughout the right upper lobe superimposed on advanced emphysema, with minimal smooth septal thickening. Similar findings are seen in the left upper lobe and lingula to a lesser extent. There is central bronchial wall thickening. A 7 mm subpleural nodule in the right lower lobe image 110 series 407 is unchanged from 2015. No new nodule. No pulmonary mass or confluent airspace disease. Upper Abdomen: No acute abnormality. Moderate hiatal hernia. Contrast refluxes into the hepatic veins and IVC. Musculoskeletal: There are no acute or suspicious osseous abnormalities. Postsurgical change in the upper lumbar spine. Review of the MIP images confirms the above findings. IMPRESSION: 1. No pulmonary embolus. 2. Advanced emphysema, which has progressed over the course of 2-1/2 years. There ill-defined patchy and ground-glass opacities in the right greater than left upper lobes superimposed on progressive emphysema, that may be infectious pneumonitis versus pulmonary edema. 3. Thoracic aortic atherosclerosis.  Coronary artery calcifications. Electronically Signed   By: Jeb Levering M.D.   On: 08/17/2016 21:18    EKG: Independently reviewed. A. fib with PVCs. Low voltage.  Assessment/Plan Principal Problem:   Acute on chronic combined systolic (congestive) and diastolic (congestive) heart failure (HCC) Active Problems:   PAF (paroxysmal atrial fibrillation) (HCC)   COPD (chronic obstructive pulmonary disease) (HCC)   CHF (congestive heart failure) (HCC)   Hemoptysis   Chronic atrial fibrillation ([place])    1. Shortness of breath probably from acute on chronic combined systolic and diastolic CHF with last EF measured in July 2017 was 45-50% with grade 2  diastolic dysfunction - other differentials include pneumonitis and also hemoptysis. Patient has gained 2 pounds from her baseline. For now I have placed patient on Lasix 40 mg IV daily. Note that patient did become hypotensive last month while on increased dose of Lasix.. Patient is not on ACE inhibitor or ARB due to hypotension last month. Closely follow intake output daily weights. Since CAT scan was showing features concerning for pneumonitis also and patient also has been having some productive cough with hemoptysis I have placed patient on doxycycline for now. Check pro-calcitonin levels and if negative then discontinue antibiotic. 2. Hemoptysis - patient had coughed up 2 small clots as per the patient today. No further episodes in the ER. Hemoglobin mildly decreased from previous. At this time I will hold off patient's Apixaban and keep patient on heparin and closely follow for any further worsening in hemoglobin or patient clinically having any hemoptysis. 3. COPD - continue nebulizers and inhalers. 4. Paroxysmal atrial fibrillation - chads 2 vasc score was 8. Patient is on anticoagulation. Continue Cardizem. 5. Mild anemia - follow CBC since patient is having hemoptysis.  I have consulted cardiologist.   DVT prophylaxis: Heparin. Code Status: Full code.  Family Communication: Discussed with patient.  Disposition Plan: Home.  Consults called: Cardiologist. Admission status: Observation.    Rise Patience MD [facility] Pager [phone].  If 7PM-7AM, please contact night-coverage [email or web address] Password [email or web address]  08/17/2016, 11:40 PM

## 2016-08-17 NOTE — ED Provider Notes (Signed)
[facility] DEPT Provider Note   CSN: [id number] Arrival date & time: 08/17/16  1436     History   Chief Complaint Chief Complaint  Patient presents with  . Hemoptysis    HPI [name] is a 75 y.o. female.  Pt presents to the ED today with hemoptysis.  The pt is on Eliquis for a.fib.  The pt had an admission at the end of August for a.fib with RVR.  She then had an admission on 9/6-7 for syncope secondary to orthostasis from an increase in her lasix.  Pt said that she has not been feeling well since her admission in August.  She said that her oxygen has been low at home and she has been very sob when she moves.  She said that after she coughed up the blood, her oxygen improved.        Past Medical History:  Diagnosis Date  . Allergic rhinitis   . Allergy   . Anxiety   . Basal cell carcinoma   . Cataract    bil cataracts removed  . COPD (chronic obstructive pulmonary disease) ([place])   . Depression 11/29/2013  . DI (detrusor instability)   . Dysrhythmia   . Esophageal stricture   . GERD (gastroesophageal reflux disease)   . Hemorrhoids   . Hiatal hernia   . History of cerebrovascular accident   . History of kidney stones   . HLD (hyperlipidemia)   . HTN (hypertension)   . Nephrolithiasis    hx  . Osteoarthritis   . Pneumonia 2013  . Shingles since Nov 18, 2013   on right arm from shoulder to wrist  . Shortness of breath dyspnea    WITH EXERTION   . Status post dilation of esophageal narrowing   . Stroke [facility]) 2001   STROKES, TIA'S  . TIA (transient ischemic attack) last 2001   anticoagulation therapy on coumadin    Patient Active Problem List   Diagnosis Date Noted  . CHF (congestive heart failure) ([place]) 08/17/2016  . Syncope 08/10/2016  . PAF (paroxysmal atrial fibrillation) ([place]) 08/10/2016  . COPD (chronic obstructive pulmonary disease) ([place]) 08/10/2016  . Hypotension 08/10/2016  . Faintness   . Volume depletion   . Chronic pain syndrome     . Atrial fibrillation with rapid ventricular response ([place]) 07/27/2016  . Acute respiratory failure ([place]) 07/27/2016  . 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 HLD (hyperlipidemia) 07/27/2016  . Essential hypertension 07/27/2016  . GERD (gastroesophageal reflux disease) 07/27/2016  . Insomnia 07/27/2016  . Depression with anxiety 07/27/2016  . Persistent atrial fibrillation ([place]) 07/27/2016  . Respiratory distress   . Gluteus medius tear 03/15/2016  . Hyperglycemia 11/12/2015  . Lumbar scoliosis 07/03/2015  . Stricture and stenosis of esophagus 03/03/2014  . On home oxygen therapy   . Swelling, mass, or lump in chest 12/19/2013  . Right wrist pain 12/19/2013  . Depression 11/29/2013  . Dysphagia, unspecified(787.20) 09/30/2013  . Lung nodule 08/01/2013  . Special screening for malignant neoplasms, colon 03/20/2013  . Lower back pain 01/21/2013  . COPD exacerbation ([place]) 09/05/2012  . Encounter for long-term (current) use of high-risk medication 02/13/2012  . Sleep related leg cramps 10/01/2011  . Basal cell carcinoma   . Osteoarthritis   . History of CVA (cerebrovascular accident)   . DUB (dysfunctional uterine bleeding)   . Atrophic vaginitis   . Menopausal symptoms   . DI (detrusor instability)   . Preventative health  care 08/07/2011  . Long term current use of Coumadin 01/05/2011  . Transient cerebral ischemia 03/25/2009  . FATIGUE 06/05/2008  . ALLERGIC RHINITIS 04/24/2008  . NEPHROLITHIASIS, HX OF 10/05/2007  . RECTOCELE WITHOUT MENTION OF UTERINE PROLAPSE 10/04/2007  . BLADDER SUSPENSION, HX OF 10/04/2007  . Hyperlipidemia 08/11/2007  . HEMORRHOIDS 08/11/2007  . Gold B Copd with acute bronchitis 08/11/2007  . GERD 08/11/2007  . IBS 08/11/2007  . CEREBROVASCULAR ACCIDENT, HX OF 08/11/2007  . FIBROCYSTIC BREAST DISEASE, HX OF 08/11/2007    Past Surgical History:  Procedure Laterality Date  . ANTERIOR AND POSTERIOR VAGINAL  REPAIR  2008   A&P REPAIR-DR MCDIARMID  . ANTERIOR LATERAL LUMBAR FUSION 4 LEVELS Right 07/03/2015   Procedure: Right Lumbar one-two, Lumbar two-three, Lumbar three-four, Lumbar four-five Anterior lateral lumbar interbody fusion;  Surgeon: Erline Levine, MD;  Location: [facility]UR[facility] ORS;  Service: Neurosurgery;  Laterality: Right;  Right L1-2 L2-3 L3-4 L4-5 Anterior lateral lumbar interbody fusion  . BALLOON DILATION N/A 03/03/2014   Procedure: BALLOON DILATION;  Surgeon: Inda Castle, MD;  Location: [facility] ENDOSCOPY;  Service: Endoscopy;  Laterality: N/A;  . BLADDER SUSPENSION    . CARDIOVERSION N/A 08/01/2016   Procedure: CARDIOVERSION;  Surgeon: Jerline Pain, MD;  Location: [facility] ENDOSCOPY;  Service: Cardiovascular;  Laterality: N/A;  . COLONOSCOPY    . ESOPHAGOGASTRODUODENOSCOPY N/A 03/03/2014   Procedure: ESOPHAGOGASTRODUODENOSCOPY (EGD);  Surgeon: Inda Castle, MD;  Location: [facility];  Service: Endoscopy;  Laterality: N/A;  . EXCISION OF BASAL CELL CA  2011  . GLUTEUS MINIMUS REPAIR Right 03/15/2016   Procedure: RIGHT HIP GLUTEUS MEDIUS TENDON REPAIR;  Surgeon: Paralee Cancel, MD;  Location: [facility] ORS;  Service: Orthopedics;  Laterality: Right;  . LUMBAR PERCUTANEOUS PEDICLE SCREW 4 LEVEL Right 07/03/2015   Procedure: Lumbar one-Sacral one Bilateral percutaneous pedicle screws;  Surgeon: Erline Levine, MD;  Location: [facility] NEURO ORS;  Service: Neurosurgery;  Laterality: Right;  L1-S1 Bilateral percutaneous pedicle screws  . RECTOCELE REPAIR  2008   A&P REPAIR -DR. MCDIARMID  . THUMB SURGERY Right 10-15 yrs ago  . UPPER GASTROINTESTINAL ENDOSCOPY    . VAGINAL HYSTERECTOMY  1983   NO BSO-DR. MABRY  . VARICOSE VEIN SURGERY      OB History    Gravida Para Term Preterm AB Living   2 2 2     2    SAB TAB Ectopic Multiple Live Births                   Home Medications    Prior to Admission medications   Medication Sig Start Date End Date Taking? Authorizing Provider  albuterol (PROVENTIL  HFA;VENTOLIN HFA) 108 (90 BASE) MCG/ACT inhaler Inhale 1-2 puffs into the lungs every 6 (six) hours as needed for wheezing or shortness of breath. 01/06/15  Yes Elsie Stain, MD  apixaban (ELIQUIS) 5 MG TABS tablet Take 1 tablet (5 mg total) by mouth 2 (two) times daily. 08/02/16  Yes Eugenie Filler, MD  azelastine (ASTELIN) 0.1 % nasal spray Place 2 sprays into the nose daily. Use in each nostril as directed Patient taking differently: Place 2 sprays into both nostrils daily.  03/01/16  Yes Javier Glazier, MD  benzonatate (TESSALON) 100 MG capsule Take 1 capsule (100 mg total) by mouth 3 (three) times daily as needed for cough. 03/22/16  Yes Tammy S Parrett, NP  budesonide-formoterol (SYMBICORT) 160-4.5 MCG/ACT inhaler Inhale 2 puffs into the lungs 2 (two) times daily. 03/01/16  Yes Javier Glazier, MD  Calcium Carbonate-Vitamin D (CALCIUM + D PO) Take 1 tablet by mouth 2 (two) times daily.    Yes Historical Provider, MD  citalopram (CELEXA) 20 MG tablet Take 1 tablet (20 mg total) by mouth daily. 11/12/15  Yes Biagio Borg, MD  cyclobenzaprine (FLEXERIL) 5 MG tablet Take 1 tablet (5 mg total) by mouth 3 (three) times daily as needed for muscle spasms. 08/19/15  Yes Biagio Borg, MD  diltiazem (CARDIZEM CD) 300 MG 24 hr capsule Take 1 capsule (300 mg total) by mouth daily. 08/02/16  Yes Eugenie Filler, MD  ezetimibe (ZETIA) 10 MG tablet Take 1 tablet (10 mg total) by mouth daily. 07/26/16 10/24/16 Yes Fay Records, MD  fluticasone (FLONASE) 50 MCG/ACT nasal spray Place 1 spray into both nostrils 2 (two) times daily. 12/22/15  Yes Javier Glazier, MD  furosemide (LASIX) 20 MG tablet Take 1 tablet (20 mg total) by mouth daily. Patient taking differently: Take 20 mg by mouth every morning.  08/02/16  Yes Eugenie Filler, MD  guaiFENesin (MUCINEX) 600 MG 12 hr tablet Take 1,200 mg by mouth 2 (two) times daily as needed for cough or to loosen phlegm.   Yes Historical Provider, MD  Lidocaine HCl 2 %  CREA Apply 1 application topically daily as needed (for shingles flares).   Yes Historical Provider, MD  LORazepam (ATIVAN) 0.5 MG tablet Take 1 tablet (0.5 mg total) by mouth 2 (two) times daily as needed for anxiety. 08/02/16  Yes Eugenie Filler, MD  Magnesium 250 MG TABS Take 1 tablet by mouth daily as needed. Leg cramps   Yes Historical Provider, MD  Melatonin 1 MG TABS Take 3 mg by mouth at bedtime.    Yes Historical Provider, MD  meloxicam (MOBIC) 15 MG tablet Take 15 mg by mouth daily.   Yes Historical Provider, MD  metoprolol succinate (TOPROL XL) 25 MG 24 hr tablet Take 1 tablet (25 mg total) by mouth daily. 08/05/16  Yes Fay Records, MD  mirabegron ER (MYRBETRIQ) 50 MG TB24 tablet Take 50 mg by mouth daily.   Yes Historical Provider, MD  montelukast (SINGULAIR) 10 MG tablet Take 10 mg by mouth daily.   Yes Historical Provider, MD  Multiple Vitamin (MULTIVITAMIN) tablet Take 1 tablet by mouth daily.     Yes Historical Provider, MD  omeprazole (PRILOSEC) 40 MG capsule Take 1 capsule (40 mg total) by mouth daily. 04/22/16  Yes Irene Shipper, MD  Respiratory Therapy Supplies (FLUTTER) DEVI Use 2-3 times per day 01/06/15  Yes Elsie Stain, MD  Spacer/Aero-Holding Chambers (AEROCHAMBER Z-STAT PLUS) inhaler Use as instructed 12/22/15  Yes Javier Glazier, MD  Tiotropium Bromide Monohydrate (SPIRIVA RESPIMAT) 2.5 MCG/ACT AERS Inhale 2 puffs into the lungs daily. 02/19/16  Yes Javier Glazier, MD  vitamin E 400 UNIT capsule Take 400 Units by mouth daily.   Yes Historical Provider, MD    Family History Family History  Problem Relation Age of Onset  . COPD Mother   . Breast cancer Mother     mid 60's  . Uterine cancer Mother   . Emphysema Mother   . Breast cancer Maternal Aunt     x 3 aunts, post menopausal  . Colon cancer Neg Hx   . Esophageal cancer Neg Hx   . Rectal cancer Neg Hx   . Stomach cancer Neg Hx   . Pancreatic cancer Neg Hx  Social History Social History    Substance Use Topics  . Smoking status: Former Smoker    Packs/day: 3.00    Years: 42.00    Types: Cigarettes    Start date: 08/04/1954    Quit date: 12/06/1995  . Smokeless tobacco: Never Used  . Alcohol use 0.0 oz/week     Comment: occas very little     Allergies   Clarithromycin; Lipitor [atorvastatin]; Simvastatin; and Crestor [rosuvastatin calcium]   Review of Systems Review of Systems  Respiratory: Positive for shortness of breath.        Hemoptysis  All other systems reviewed and are negative.    Physical Exam Updated Vital Signs BP 118/67   Pulse (!) 49   Temp 98 F (36.7 C) (Oral)   Resp 18   LMP  (Exact Date)   SpO2 92%   Physical Exam  Constitutional: She is oriented to person, place, and time. She appears well-developed and well-nourished.  HENT:  Head: Normocephalic and atraumatic.  Right Ear: External ear normal.  Left Ear: External ear normal.  Nose: Nose normal.  Mouth/Throat: Oropharynx is clear and moist.  Eyes: Conjunctivae and EOM are normal. Pupils are equal, round, and reactive to light.  Neck: Normal range of motion. Neck supple.  Cardiovascular: Normal rate, normal heart sounds and intact distal pulses.  An irregularly irregular rhythm present.  Pulmonary/Chest: Effort normal and breath sounds normal.  Abdominal: Soft. Bowel sounds are normal.  Musculoskeletal: She exhibits edema.  Neurological: She is alert and oriented to person, place, and time.  Skin: Skin is warm and dry.  Psychiatric: She has a normal mood and affect. Her behavior is normal. Judgment and thought content normal.  Nursing note and vitals reviewed.    ED Treatments / Results  Labs (all labs ordered are listed, but only abnormal results are displayed) Labs Reviewed  BASIC METABOLIC PANEL - Abnormal; Notable for the following:       Result Value   Sodium 133 (*)    CO2 19 (*)    Creatinine, Ser 1.11 (*)    GFR calc non Af Amer 47 (*)    GFR calc Af Amer 54 (*)     All other components within normal limits  CBC - Abnormal; Notable for the following:    Hemoglobin 11.0 (*)    HCT 34.6 (*)    MCH 25.7 (*)    RDW 16.9 (*)    All other components within normal limits  PROTIME-INR - Abnormal; Notable for the following:    Prothrombin Time 15.4 (*)    All other components within normal limits  TROPONIN I  BRAIN NATRIURETIC PEPTIDE  I-STAT TROPOININ, ED    EKG  EKG Interpretation  Date/Time:  Wednesday August 17 2016 14:41:53 EDT Ventricular Rate:  109 PR Interval:    QRS Duration: 86 QT Interval:  392 QTC Calculation: 527 R Axis:     Text Interpretation:  a.fib Low voltage QRS Cannot rule out Anteroseptal infarct , age undetermined Prolonged QT Abnormal ECG No significant change since last tracing Confirmed by [facility] MD, Dash Cardarelli ([id number]) on 08/17/2016 7:17:32 PM       Radiology Dg Chest 2 View  Result Date: 08/17/2016 CLINICAL DATA:  Shortness of breath, chest heaviness and pain with weakness for the past week. Bloody sputum today. History of COPD CVA, CHF. EXAM: CHEST  2 VIEW COMPARISON:  PA and lateral chest x-ray of August 10, 2016 FINDINGS: The lungs remain hyperinflated. The interstitial markings  remain increased overall. There is no alveolar infiltrate or pleural effusion. The heart and pulmonary vascularity are normal. There is a hiatal hernia. There is calcification in the wall of the aortic arch. There is mild multilevel degenerative disc disease of the thoracic spine. IMPRESSION: COPD with chronic pulmonary fibrosis. No alveolar pneumonia nor CHF. Aortic atherosclerosis. Electronically Signed   By: David  Martinique M.D.   On: 08/17/2016 15:55   Ct Angio Chest Pe W And/or Wo Contrast  Result Date: 08/17/2016 CLINICAL DATA:  Hemoptysis. Increased shortness of breath with exertion. Cough for 1 week. EXAM: CT ANGIOGRAPHY CHEST WITH CONTRAST TECHNIQUE: Multidetector CT imaging of the chest was performed using the standard protocol during  bolus administration of intravenous contrast. Multiplanar CT image reconstructions and MIPs were obtained to evaluate the vascular anatomy. CONTRAST:  100 cc Isovue 370 IV COMPARISON:  Radiographs earlier this day.  Chest CT 12/19/2013 FINDINGS: Cardiovascular: There are no filling defects within the pulmonary arteries to suggest pulmonary embolus. The thoracic aorta is normal in caliber without dissection. There is moderate atherosclerosis without aneurysm. There is a conventional branching pattern from the aortic arch. Coronary artery calcifications are seen. Mediastinum/Nodes: Minimal ill-defined hilar soft tissue density likely lymphoid tissue, and discrete no wounds not seen. No mediastinal adenopathy. No pericardial effusion. Lungs/Pleura: Advanced emphysema, this is progressed from chest CT 2 and half years ago. Ill-defined ground-glass opacities throughout the right upper lobe superimposed on advanced emphysema, with minimal smooth septal thickening. Similar findings are seen in the left upper lobe and lingula to a lesser extent. There is central bronchial wall thickening. A 7 mm subpleural nodule in the right lower lobe image 110 series 407 is unchanged from 2015. No new nodule. No pulmonary mass or confluent airspace disease. Upper Abdomen: No acute abnormality. Moderate hiatal hernia. Contrast refluxes into the hepatic veins and IVC. Musculoskeletal: There are no acute or suspicious osseous abnormalities. Postsurgical change in the upper lumbar spine. Review of the MIP images confirms the above findings. IMPRESSION: 1. No pulmonary embolus. 2. Advanced emphysema, which has progressed over the course of 2-1/2 years. There ill-defined patchy and ground-glass opacities in the right greater than left upper lobes superimposed on progressive emphysema, that may be infectious pneumonitis versus pulmonary edema. 3. Thoracic aortic atherosclerosis.  Coronary artery calcifications. Electronically Signed   By:  Jeb Levering M.D.   On: 08/17/2016 21:18    Procedures Procedures (including critical care time)  Medications Ordered in ED Medications  iopamidol (ISOVUE-370) 76 % injection (100 mLs  Contrast Given 08/17/16 2034)  furosemide (LASIX) injection 40 mg (40 mg Intravenous Given 08/17/16 2147)     Initial Impression / Assessment and Plan / ED Course  I have reviewed the triage vital signs and the nursing notes.  Pertinent labs & imaging results that were available during my care of the patient were reviewed by me and considered in my medical decision making (see chart for details).  Clinical Course   Pt used a bedside commode and o2 sats dropped to 85% and she became very sob.  We attempted walking pt and she became very sob and o2 sats dropped to 84%.  Pt spoke with Dr. Hal Hope (triad) who will admit for obs.   Final Clinical Impressions(s) / ED Diagnoses   Final diagnoses:  Acute on chronic congestive heart failure, unspecified congestive heart failure type (HCC)  Hemoptysis  Chronic atrial fibrillation (HCC)    New Prescriptions New Prescriptions   No medications on file  Isla Pence, MD 08/17/16 2153

## 2016-08-17 NOTE — ED Notes (Signed)
Taken to CT at this time. 

## 2016-08-17 NOTE — ED Triage Notes (Signed)
Patient complains of coughing up two large blood clots this am that she describes as rust colored. States that she was recently hospitalized late August for low sats and atrial fib. States she has had ongoing chest pain since that admission. On arrival no distress, no coughing. Alert and oriented

## 2016-08-17 NOTE — Telephone Encounter (Signed)
Patient is on Coumadin. She needs to be evaluated immediately in the closest Emergency Department.

## 2016-08-17 NOTE — Telephone Encounter (Signed)
Spoke with pt and advised to go to nearest ED for immediate evaluation. Pt has appt scheduled with PCP this afternoon and I offered to call Dr Jenny Reichmann and let him know about situation. Pt states that she is heading to ED now. Dr Gwynn Burly office advised. Nothing further needed.

## 2016-08-17 NOTE — Patient Outreach (Signed)
Transition of care: Patient called and left me a message stating that she has to finish her stress test tomorrow and ask for a return call about planned home visit.  2:12pm  Placed call back to patient who report that she is coughing up blood clots and is on her way to the emergency department.  Informed patient to not worry about home visit for tomorrow that we could rescheduled.  PLAN: will follow ED progress.  Will rescheduled home visit when patient is able.  Tomasa Rand, RN, BSN, CEN [facility] Coordinator [phone]

## 2016-08-17 NOTE — Progress Notes (Signed)
ANTICOAGULATION CONSULT NOTE - Initial Consult  Pharmacy Consult for heparin Indication: atrial fibrillation  Allergies  Allergen Reactions  . Clarithromycin Other (See Comments)    REACTION: possible rash but could have been legionarres dz with rash  . Lipitor [Atorvastatin] Other (See Comments)    MUSCLE SPASMS  . Simvastatin Other (See Comments)    Muscle and joint pain  . Crestor [Rosuvastatin Calcium]     fagitue and joint pain, "NO STATINS"    Patient Measurements: Height: 5\' 4"  (162.6 cm) Weight: 142 lb 6.4 oz (64.6 kg) (scale c) IBW/kg (Calculated) : 54.7  Vital Signs: Temp: 98.4 F (36.9 C) (09/13 2242) Temp Source: Oral (09/13 2242) BP: 125/57 (09/13 2242) Pulse Rate: 91 (09/13 2242)  Labs:  Recent Labs  08/17/16 1448 08/17/16 1926  HGB 11.0*  --   HCT 34.6*  --   PLT 181  --   LABPROT 15.4*  --   INR 1.21  --   CREATININE 1.11*  --   TROPONINI  --  <0.03    Estimated Creatinine Clearance: 36.7 mL/min (by C-G formula based on SCr of 1.11 mg/dL (H)).   Medical History: Past Medical History:  Diagnosis Date  . Allergic rhinitis   . Allergy   . Anxiety   . Basal cell carcinoma   . Cataract    bil cataracts removed  . COPD (chronic obstructive pulmonary disease) ([place])   . Depression 11/29/2013  . DI (detrusor instability)   . Dysrhythmia   . Esophageal stricture   . GERD (gastroesophageal reflux disease)   . Hemorrhoids   . Hiatal hernia   . History of cerebrovascular accident   . History of kidney stones   . HLD (hyperlipidemia)   . HTN (hypertension)   . Nephrolithiasis    hx  . Osteoarthritis   . Pneumonia 2013  . Shingles since Nov 18, 2013   on right arm from shoulder to wrist  . Shortness of breath dyspnea    WITH EXERTION   . Status post dilation of esophageal narrowing   . Stroke [facility] 2001   STROKES, TIA'S  . TIA (transient ischemic attack) last 2001   anticoagulation therapy on coumadin    Medications:  Prescriptions  Prior to Admission  Medication Sig Dispense Refill Last Dose  . albuterol (PROVENTIL HFA;VENTOLIN HFA) 108 (90 BASE) MCG/ACT inhaler Inhale 1-2 puffs into the lungs every 6 (six) hours as needed for wheezing or shortness of breath. 3 Inhaler 4 08/16/2016 at pm  . apixaban (ELIQUIS) 5 MG TABS tablet Take 1 tablet (5 mg total) by mouth 2 (two) times daily. 60 tablet 0 08/17/2016 at 0530  . azelastine (ASTELIN) 0.1 % nasal spray Place 2 sprays into the nose daily. Use in each nostril as directed (Patient taking differently: Place 2 sprays into both nostrils daily. ) 90 mL 1 08/17/2016 at am  . benzonatate (TESSALON) 100 MG capsule Take 1 capsule (100 mg total) by mouth 3 (three) times daily as needed for cough. 90 capsule 0 Past Week at Unknown time  . budesonide-formoterol (SYMBICORT) 160-4.5 MCG/ACT inhaler Inhale 2 puffs into the lungs 2 (two) times daily. 3 Inhaler 4 08/17/2016 at am  . Calcium Carbonate-Vitamin D (CALCIUM + D PO) Take 1 tablet by mouth 2 (two) times daily.    08/17/2016 at am  . citalopram (CELEXA) 20 MG tablet Take 1 tablet (20 mg total) by mouth daily. 90 tablet 3 08/17/2016 at am  . cyclobenzaprine (FLEXERIL) 5 MG tablet  Take 1 tablet (5 mg total) by mouth 3 (three) times daily as needed for muscle spasms. 270 tablet 0 Past Month at Unknown time  . diltiazem (CARDIZEM CD) 300 MG 24 hr capsule Take 1 capsule (300 mg total) by mouth daily. 90 capsule 0 08/17/2016 at am  . ezetimibe (ZETIA) 10 MG tablet Take 1 tablet (10 mg total) by mouth daily. 90 tablet 3 08/17/2016 at am  . fluticasone (FLONASE) 50 MCG/ACT nasal spray Place 1 spray into both nostrils 2 (two) times daily. 48 g 1 08/17/2016 at am  . furosemide (LASIX) 20 MG tablet Take 1 tablet (20 mg total) by mouth daily. (Patient taking differently: Take 20 mg by mouth every morning. ) 90 tablet 0 08/17/2016 at am  . guaiFENesin (MUCINEX) 600 MG 12 hr tablet Take 1,200 mg by mouth 2 (two) times daily as needed for cough or to loosen  phlegm.   08/17/2016 at am  . Lidocaine HCl 2 % CREA Apply 1 application topically daily as needed (for shingles flares).     . LORazepam (ATIVAN) 0.5 MG tablet Take 1 tablet (0.5 mg total) by mouth 2 (two) times daily as needed for anxiety. 15 tablet 0 Past Month at Unknown time  . Magnesium 250 MG TABS Take 1 tablet by mouth daily as needed. Leg cramps   Past Week at Unknown time  . Melatonin 1 MG TABS Take 3 mg by mouth at bedtime.    08/16/2016 at pm  . meloxicam (MOBIC) 15 MG tablet Take 15 mg by mouth daily.   08/17/2016 at am  . metoprolol succinate (TOPROL XL) 25 MG 24 hr tablet Take 1 tablet (25 mg total) by mouth daily. 30 tablet 0 08/17/2016 at 0530  . mirabegron ER (MYRBETRIQ) 50 MG TB24 tablet Take 50 mg by mouth daily.   08/16/2016 at pm  . montelukast (SINGULAIR) 10 MG tablet Take 10 mg by mouth daily.   08/17/2016 at am  . Multiple Vitamin (MULTIVITAMIN) tablet Take 1 tablet by mouth daily.     08/17/2016 at am  . omeprazole (PRILOSEC) 40 MG capsule Take 1 capsule (40 mg total) by mouth daily. 90 capsule 3 08/17/2016 at am  . Respiratory Therapy Supplies (FLUTTER) DEVI Use 2-3 times per day 1 each 0 Taking  . Spacer/Aero-Holding Chambers (AEROCHAMBER Z-STAT PLUS) inhaler Use as instructed 1 each 0 Taking  . Tiotropium Bromide Monohydrate (SPIRIVA RESPIMAT) 2.5 MCG/ACT AERS Inhale 2 puffs into the lungs daily. 3 Inhaler 3 08/17/2016 at am  . vitamin E 400 UNIT capsule Take 400 Units by mouth daily.   08/16/2016 at pm   Scheduled:  . [START ON 08/18/2016] azelastine  2 spray Each Nare Daily  . [START ON 08/18/2016] citalopram  20 mg Oral Daily  . [START ON 08/18/2016] diltiazem  300 mg Oral Daily  . [START ON 08/18/2016] ezetimibe  10 mg Oral Daily  . [START ON 08/18/2016] fluticasone  1 spray Each Nare BID  . [START ON 08/18/2016] furosemide  40 mg Intravenous Daily  . Melatonin  3 mg Oral QHS  . [START ON 08/18/2016] metoprolol succinate  25 mg Oral Daily  . [START ON 08/18/2016] mirabegron ER   50 mg Oral Daily  . [START ON 08/18/2016] mometasone-formoterol  2 puff Inhalation BID  . [START ON 08/18/2016] montelukast  10 mg Oral Daily  . [START ON 08/18/2016] multivitamin with minerals  1 tablet Oral Daily  . [START ON 08/18/2016] pantoprazole  40 mg Oral Daily  . [  START ON 08/18/2016] tiotropium  1 capsule Inhalation Daily  . [START ON 08/18/2016] vitamin E  400 Units Oral Daily    Assessment: 75yo female on apixaban PTA for Afib (last dose 9/13 0530) presents w/ hemoptysis (described as two large rust-colored clots) and acute on chronic CHF, to transition to heparin; will dose conservatively for now 2/2 possible hemoptysis but will broaden goal if resolved.  Goal of Therapy:  Heparin level 0.3-0.5 units/ml aPTT 66-84 seconds Monitor platelets by anticoagulation protocol: Yes   Plan:  Will start heparin gtt at 800 units/hr and monitor heparin levels, aPTT while Eliquis is being cleared, and CBC.  Wynona Neat, PharmD, BCPS  08/17/2016,11:56 PM

## 2016-08-18 ENCOUNTER — Ambulatory Visit (HOSPITAL_COMMUNITY): Payer: Medicare Other

## 2016-08-18 ENCOUNTER — Ambulatory Visit: Payer: Self-pay

## 2016-08-18 DIAGNOSIS — J441 Chronic obstructive pulmonary disease with (acute) exacerbation: Secondary | ICD-10-CM | POA: Diagnosis not present

## 2016-08-18 DIAGNOSIS — D649 Anemia, unspecified: Secondary | ICD-10-CM | POA: Diagnosis present

## 2016-08-18 DIAGNOSIS — Z9841 Cataract extraction status, right eye: Secondary | ICD-10-CM | POA: Diagnosis not present

## 2016-08-18 DIAGNOSIS — Z87891 Personal history of nicotine dependence: Secondary | ICD-10-CM | POA: Diagnosis not present

## 2016-08-18 DIAGNOSIS — E785 Hyperlipidemia, unspecified: Secondary | ICD-10-CM | POA: Diagnosis present

## 2016-08-18 DIAGNOSIS — Z85828 Personal history of other malignant neoplasm of skin: Secondary | ICD-10-CM | POA: Diagnosis not present

## 2016-08-18 DIAGNOSIS — J9621 Acute and chronic respiratory failure with hypoxia: Secondary | ICD-10-CM

## 2016-08-18 DIAGNOSIS — M1990 Unspecified osteoarthritis, unspecified site: Secondary | ICD-10-CM | POA: Diagnosis present

## 2016-08-18 DIAGNOSIS — I5041 Acute combined systolic (congestive) and diastolic (congestive) heart failure: Secondary | ICD-10-CM

## 2016-08-18 DIAGNOSIS — J410 Simple chronic bronchitis: Secondary | ICD-10-CM

## 2016-08-18 DIAGNOSIS — Z7901 Long term (current) use of anticoagulants: Secondary | ICD-10-CM | POA: Diagnosis not present

## 2016-08-18 DIAGNOSIS — F329 Major depressive disorder, single episode, unspecified: Secondary | ICD-10-CM | POA: Diagnosis present

## 2016-08-18 DIAGNOSIS — R042 Hemoptysis: Secondary | ICD-10-CM | POA: Diagnosis not present

## 2016-08-18 DIAGNOSIS — I509 Heart failure, unspecified: Secondary | ICD-10-CM | POA: Diagnosis not present

## 2016-08-18 DIAGNOSIS — I482 Chronic atrial fibrillation: Secondary | ICD-10-CM | POA: Diagnosis not present

## 2016-08-18 DIAGNOSIS — I5043 Acute on chronic combined systolic (congestive) and diastolic (congestive) heart failure: Secondary | ICD-10-CM | POA: Diagnosis not present

## 2016-08-18 DIAGNOSIS — F419 Anxiety disorder, unspecified: Secondary | ICD-10-CM | POA: Diagnosis present

## 2016-08-18 DIAGNOSIS — Z9842 Cataract extraction status, left eye: Secondary | ICD-10-CM | POA: Diagnosis not present

## 2016-08-18 DIAGNOSIS — Z981 Arthrodesis status: Secondary | ICD-10-CM | POA: Diagnosis not present

## 2016-08-18 DIAGNOSIS — I110 Hypertensive heart disease with heart failure: Secondary | ICD-10-CM | POA: Diagnosis present

## 2016-08-18 DIAGNOSIS — J438 Other emphysema: Secondary | ICD-10-CM | POA: Diagnosis not present

## 2016-08-18 DIAGNOSIS — Z23 Encounter for immunization: Secondary | ICD-10-CM | POA: Diagnosis not present

## 2016-08-18 DIAGNOSIS — Z8673 Personal history of transient ischemic attack (TIA), and cerebral infarction without residual deficits: Secondary | ICD-10-CM | POA: Diagnosis not present

## 2016-08-18 DIAGNOSIS — R0600 Dyspnea, unspecified: Secondary | ICD-10-CM | POA: Diagnosis not present

## 2016-08-18 DIAGNOSIS — Z79899 Other long term (current) drug therapy: Secondary | ICD-10-CM | POA: Diagnosis not present

## 2016-08-18 DIAGNOSIS — K219 Gastro-esophageal reflux disease without esophagitis: Secondary | ICD-10-CM | POA: Diagnosis present

## 2016-08-18 DIAGNOSIS — N179 Acute kidney failure, unspecified: Secondary | ICD-10-CM | POA: Diagnosis present

## 2016-08-18 LAB — TROPONIN I
Troponin I: <0.03 ng/mL (ref <0.03)
Troponin I: <0.03 ng/mL (ref <0.03)
Troponin I: <0.03 ng/mL (ref <0.03)

## 2016-08-18 LAB — CBC
HCT: 34.3 % — ABNORMAL LOW (ref 36.0–46.0)
HCT: 32.6 % — ABNORMAL LOW (ref 36.0–46.0)
HCT: 34.9 % — ABNORMAL LOW (ref 36.0–46.0)
Hemoglobin: 10.7 g/dL — ABNORMAL LOW (ref 12.0–15.0)
Hemoglobin: 10.3 g/dL — ABNORMAL LOW (ref 12.0–15.0)
Hemoglobin: 11 g/dL — ABNORMAL LOW (ref 12.0–15.0)
MCH: 25.1 pg — ABNORMAL LOW (ref 26.0–34.0)
MCH: 25.4 pg — ABNORMAL LOW (ref 26.0–34.0)
MCH: 25.2 pg — ABNORMAL LOW (ref 26.0–34.0)
MCHC: 31.2 g/dL (ref 30.0–36.0)
MCHC: 31.6 g/dL (ref 30.0–36.0)
MCHC: 31.5 g/dL (ref 30.0–36.0)
MCV: 80.3 fL (ref 78.0–100.0)
MCV: 80.5 fL (ref 78.0–100.0)
MCV: 80 fL (ref 78.0–100.0)
Platelets: 215 10*3/uL (ref 150–400)
Platelets: 199 10*3/uL (ref 150–400)
Platelets: 235 10*3/uL (ref 150–400)
RBC: 4.27 MIL/uL (ref 3.87–5.11)
RBC: 4.05 MIL/uL (ref 3.87–5.11)
RBC: 4.36 MIL/uL (ref 3.87–5.11)
RDW: 17 % — ABNORMAL HIGH (ref 11.5–15.5)
RDW: 17 % — ABNORMAL HIGH (ref 11.5–15.5)
RDW: 16.8 % — ABNORMAL HIGH (ref 11.5–15.5)
WBC: 5.1 10*3/uL (ref 4.0–10.5)
WBC: 4.3 10*3/uL (ref 4.0–10.5)
WBC: 4.6 10*3/uL (ref 4.0–10.5)

## 2016-08-18 LAB — BASIC METABOLIC PANEL
Anion gap: 8 (ref 5–15)
BUN: 13 mg/dL (ref 6–20)
CO2: 26 mmol/L (ref 22–32)
Calcium: 9 mg/dL (ref 8.9–10.3)
Chloride: 104 mmol/L (ref 101–111)
Creatinine, Ser: 1.12 mg/dL — ABNORMAL HIGH (ref 0.44–1.00)
GFR calc Af Amer: 53 mL/min — ABNORMAL LOW (ref >60)
GFR calc non Af Amer: 46 mL/min — ABNORMAL LOW (ref >60)
Glucose, Bld: 98 mg/dL (ref 65–99)
Potassium: 4 mmol/L (ref 3.5–5.1)
Sodium: 138 mmol/L (ref 135–145)

## 2016-08-18 LAB — MAGNESIUM: Magnesium: 2.1 mg/dL (ref 1.7–2.4)

## 2016-08-18 LAB — PROCALCITONIN: Procalcitonin: <0.1 ng/mL

## 2016-08-18 LAB — HEPARIN LEVEL (UNFRACTIONATED)
Heparin Unfractionated: 1.24 IU/mL — ABNORMAL HIGH (ref 0.30–0.70)
Heparin Unfractionated: 1.07 IU/mL — ABNORMAL HIGH (ref 0.30–0.70)

## 2016-08-18 LAB — APTT
aPTT: 46 seconds — ABNORMAL HIGH (ref 24–36)
aPTT: 63 seconds — ABNORMAL HIGH (ref 24–36)

## 2016-08-18 LAB — TYPE AND SCREEN
ABO/RH(D): O POS
Antibody Screen: NEGATIVE

## 2016-08-18 MED ORDER — HEPARIN (PORCINE) IN NACL 100-0.45 UNIT/ML-% IJ SOLN
800.0000 [IU]/h | INTRAMUSCULAR | Status: DC
  Administered 2016-08-18: 800 [IU]/h via INTRAVENOUS
  Filled 2016-08-18: qty 250

## 2016-08-18 MED ORDER — DOXYCYCLINE HYCLATE 100 MG IV SOLR
100.0000 mg | Freq: Once | INTRAVENOUS | Status: AC
  Administered 2016-08-18: 100 mg via INTRAVENOUS
  Filled 2016-08-18: qty 100

## 2016-08-18 MED ORDER — TIOTROPIUM BROMIDE MONOHYDRATE 18 MCG IN CAPS
18.0000 ug | ORAL_CAPSULE | Freq: Every day | RESPIRATORY_TRACT | Status: DC
  Administered 2016-08-19 – 2016-08-21 (×3): 18 ug via RESPIRATORY_TRACT
  Filled 2016-08-18: qty 5

## 2016-08-18 MED ORDER — ALBUTEROL SULFATE (2.5 MG/3ML) 0.083% IN NEBU
2.5000 mg | INHALATION_SOLUTION | Freq: Three times a day (TID) | RESPIRATORY_TRACT | Status: DC
  Administered 2016-08-19: 2.5 mg via RESPIRATORY_TRACT
  Filled 2016-08-18: qty 3

## 2016-08-18 MED ORDER — IPRATROPIUM-ALBUTEROL 0.5-2.5 (3) MG/3ML IN SOLN
3.0000 mL | Freq: Four times a day (QID) | RESPIRATORY_TRACT | Status: DC
  Administered 2016-08-18 (×2): 3 mL via RESPIRATORY_TRACT
  Filled 2016-08-18 (×2): qty 3

## 2016-08-18 MED ORDER — DOXYCYCLINE HYCLATE 100 MG IV SOLR
100.0000 mg | Freq: Two times a day (BID) | INTRAVENOUS | Status: DC
  Administered 2016-08-18 – 2016-08-22 (×8): 100 mg via INTRAVENOUS
  Filled 2016-08-18 (×10): qty 100

## 2016-08-18 MED ORDER — ALBUTEROL SULFATE (2.5 MG/3ML) 0.083% IN NEBU
3.0000 mL | INHALATION_SOLUTION | RESPIRATORY_TRACT | Status: DC | PRN
  Administered 2016-08-19 – 2016-08-21 (×3): 3 mL via RESPIRATORY_TRACT
  Filled 2016-08-18 (×3): qty 3

## 2016-08-18 MED ORDER — HEPARIN (PORCINE) IN NACL 100-0.45 UNIT/ML-% IJ SOLN
1100.0000 [IU]/h | INTRAMUSCULAR | Status: DC
  Administered 2016-08-18: 1000 [IU]/h via INTRAVENOUS
  Administered 2016-08-19: 1100 [IU]/h via INTRAVENOUS
  Filled 2016-08-18: qty 250

## 2016-08-18 NOTE — Progress Notes (Signed)
PROGRESS NOTE    [name]  [id number] DOB: Aug 02, 1941 DOA: 08/17/2016 PCP: Cathlean Cower, MD  Brief Narrative: [name] is a 75 y.o. with a history of chronic atrial fibrillation, COPD, combined systolic and diastolic heart failure. He presents with dyspnea and hemoptysis. CTA of her chest ruled out PE with evidence of pneumonitis versus pulmonary congestion and worsening emphysema. Patient's symptoms improved with Lasix IV   Assessment & Plan:   Principal Problem:   Acute on chronic combined systolic (congestive) and diastolic (congestive) heart failure (HCC) Active Problems:   PAF (paroxysmal atrial fibrillation) (HCC)   COPD (chronic obstructive pulmonary disease) (HCC)   CHF (congestive heart failure) (HCC)   Hemoptysis   Chronic atrial fibrillation (HCC)   Dyspnea, likely secondary to acute on chronic systolic and diastolic heart failure Acute respiratory failure with hypoxia Patient responded well to initial IV diuresis. Symptoms have much improved. She is down 4 pounds since last night and out about 3.4 L -Continue Lasix 40 mg IV daily; may add twice a day dosing if urine output decreases this afternoon -Continue daily weights -Continue strict ins and outs -Wean O2 to room air  Hemoptysis Be secondary to coughing. Resolved  COPD Might be in mild exacerbation. Wheezing significant on exam with patient report of decreased exercise tolerance, productive cough with change in sputum amount. -Scheduled DuoNeb every 6 hours and albuterol every 4 hours when necessary -Continue doxycycline  Paroxysmal atrial fibrillation Patient surgery slightly elevated in the 100s. She is on Cardizem and metoprolol for rate control. She takes Eliquis or anticoagulation. -Continue Cardizem -Continue metoprolol -Eliquis admission and patient started on heparin drip -Follow-up cardiology recommendations  Anemia Stable  DVT prophylaxis: Heparin (Eliquis at home)  Code Status:  Full code  Family Communication: Husband at bedside Disposition Plan: Likely discharge home tomorrow   Consultants:   Cardiology   Procedures:  None   Antimicrobials:   Doxycycline (9/13   Subjective: Patient reports increasing dyspnea. Coughing has lessened some. She reports no hemoptysis  Objective: Vitals:   08/18/16 0540 08/18/16 0800 08/18/16 0804 08/18/16 1115  BP: 110/61 101/68  104/73  Pulse: (!) 53 76    Resp: 20 20    Temp: 99 F (37.2 C) 98.8 F (37.1 C)    TempSrc: Oral Oral    SpO2:  90% 93%   Weight: 62.7 kg (138 lb 3.2 oz)     Height:        Intake/Output Summary (Last 24 hours) at 08/18/16 1503 Last data filed at 08/18/16 1424  Gross per 24 hour  Intake              475 ml  Output             4100 ml  Net            -3625 ml   Filed Weights   08/17/16 2242 08/18/16 0540  Weight: 64.6 kg (142 lb 6.4 oz) 62.7 kg (138 lb 3.2 oz)    Examination:  General exam: Appears calm and comfortable Respiratory system: Diffuse wheezing with prolonged expiratory phase. Respiratory effort normal. Cardiovascular system: S1 & S2 heard, normal rate with irregular rhythm. No murmurs, rubs, gallops or clicks. Gastrointestinal system: Abdomen is nondistended, soft and nontender. No organomegaly or masses felt. Normal bowel sounds heard. Central nervous system: Alert and oriented. No focal neurological deficits. Extremities: No edema. No calf tenderness Skin: No cyanosis. No rashes Psychiatry: Judgement and insight appear normal. Mood &  affect appropriate.     Data Reviewed: I have personally reviewed following labs and imaging studies  CBC:  Recent Labs Lab 08/17/16 1448 08/18/16 0001 08/18/16 0532 08/18/16 1216  WBC 6.0 5.1 4.3 4.6  HGB 11.0* 10.7* 10.3* 11.0*  HCT 34.6* 34.3* 32.6* 34.9*  MCV 80.8 80.3 80.5 80.0  PLT 181 215 199 [id number]   Basic Metabolic Panel:  Recent Labs Lab 08/17/16 1448 08/18/16 0001 08/18/16 0532  NA 133*  --  138  K  4.5  --  4.0  CL 103  --  104  CO2 19*  --  26  GLUCOSE 82  --  98  BUN 17  --  13  CREATININE 1.11*  --  1.12*  CALCIUM 8.9  --  9.0  MG  --  2.1  --    GFR: Estimated Creatinine Clearance: 36.3 mL/min (by C-G formula based on SCr of 1.12 mg/dL (H)). Liver Function Tests: No results for input(s): AST, ALT, ALKPHOS, BILITOT, PROT, ALBUMIN in the last 168 hours. No results for input(s): LIPASE, AMYLASE in the last 168 hours. No results for input(s): AMMONIA in the last 168 hours. Coagulation Profile:  Recent Labs Lab 08/17/16 1448  INR 1.21   Cardiac Enzymes:  Recent Labs Lab 08/17/16 1926 08/18/16 0001 08/18/16 0532 08/18/16 1216  TROPONINI <0.03 <0.03 <0.03 <0.03   BNP (last 3 results) No results for input(s): PROBNP in the last 8760 hours. HbA1C: No results for input(s): HGBA1C in the last 72 hours. CBG: No results for input(s): GLUCAP in the last 168 hours. Lipid Profile: No results for input(s): CHOL, HDL, LDLCALC, TRIG, CHOLHDL, LDLDIRECT in the last 72 hours. Thyroid Function Tests: No results for input(s): TSH, T4TOTAL, FREET4, T3FREE, THYROIDAB in the last 72 hours. Anemia Panel: No results for input(s): VITAMINB12, FOLATE, FERRITIN, TIBC, IRON, RETICCTPCT in the last 72 hours. Sepsis Labs:  Recent Labs Lab 08/18/16 0908  PROCALCITON <0.10    No results found for this or any previous visit (from the past 240 hour(s)).       Radiology Studies: Dg Chest 2 View  Result Date: 08/17/2016 CLINICAL DATA:  Shortness of breath, chest heaviness and pain with weakness for the past week. Bloody sputum today. History of COPD CVA, CHF. EXAM: CHEST  2 VIEW COMPARISON:  PA and lateral chest x-ray of August 10, 2016 FINDINGS: The lungs remain hyperinflated. The interstitial markings remain increased overall. There is no alveolar infiltrate or pleural effusion. The heart and pulmonary vascularity are normal. There is a hiatal hernia. There is calcification in the  wall of the aortic arch. There is mild multilevel degenerative disc disease of the thoracic spine. IMPRESSION: COPD with chronic pulmonary fibrosis. No alveolar pneumonia nor CHF. Aortic atherosclerosis. Electronically Signed   By: David  Martinique M.D.   On: 08/17/2016 15:55   Ct Angio Chest Pe W And/or Wo Contrast  Result Date: 08/17/2016 CLINICAL DATA:  Hemoptysis. Increased shortness of breath with exertion. Cough for 1 week. EXAM: CT ANGIOGRAPHY CHEST WITH CONTRAST TECHNIQUE: Multidetector CT imaging of the chest was performed using the standard protocol during bolus administration of intravenous contrast. Multiplanar CT image reconstructions and MIPs were obtained to evaluate the vascular anatomy. CONTRAST:  100 cc Isovue 370 IV COMPARISON:  Radiographs earlier this day.  Chest CT 12/19/2013 FINDINGS: Cardiovascular: There are no filling defects within the pulmonary arteries to suggest pulmonary embolus. The thoracic aorta is normal in caliber without dissection. There is moderate atherosclerosis without aneurysm. There is  a conventional branching pattern from the aortic arch. Coronary artery calcifications are seen. Mediastinum/Nodes: Minimal ill-defined hilar soft tissue density likely lymphoid tissue, and discrete no wounds not seen. No mediastinal adenopathy. No pericardial effusion. Lungs/Pleura: Advanced emphysema, this is progressed from chest CT 2 and half years ago. Ill-defined ground-glass opacities throughout the right upper lobe superimposed on advanced emphysema, with minimal smooth septal thickening. Similar findings are seen in the left upper lobe and lingula to a lesser extent. There is central bronchial wall thickening. A 7 mm subpleural nodule in the right lower lobe image 110 series 407 is unchanged from 2015. No new nodule. No pulmonary mass or confluent airspace disease. Upper Abdomen: No acute abnormality. Moderate hiatal hernia. Contrast refluxes into the hepatic veins and IVC.  Musculoskeletal: There are no acute or suspicious osseous abnormalities. Postsurgical change in the upper lumbar spine. Review of the MIP images confirms the above findings. IMPRESSION: 1. No pulmonary embolus. 2. Advanced emphysema, which has progressed over the course of 2-1/2 years. There ill-defined patchy and ground-glass opacities in the right greater than left upper lobes superimposed on progressive emphysema, that may be infectious pneumonitis versus pulmonary edema. 3. Thoracic aortic atherosclerosis.  Coronary artery calcifications. Electronically Signed   By: Jeb Levering M.D.   On: 08/17/2016 21:18        Scheduled Meds: . azelastine  2 spray Each Nare Daily  . citalopram  20 mg Oral Daily  . diltiazem  300 mg Oral Daily  . doxycycline (VIBRAMYCIN) IV  100 mg Intravenous Q12H  . ezetimibe  10 mg Oral Daily  . fluticasone  1 spray Each Nare BID  . furosemide  40 mg Intravenous Daily  . ipratropium-albuterol  3 mL Nebulization Q6H  . Melatonin  3 mg Oral QHS  . metoprolol succinate  25 mg Oral Daily  . mirabegron ER  50 mg Oral Daily  . mometasone-formoterol  2 puff Inhalation BID  . montelukast  10 mg Oral Daily  . multivitamin with minerals  1 tablet Oral Daily  . pantoprazole  40 mg Oral Daily  . vitamin E  400 Units Oral Daily   Continuous Infusions: . heparin 1,000 Units/hr (08/18/16 1115)     LOS: 0 days     Cordelia Poche [facility] 08/18/2016, 3:03 PM Pager: [phone]  If 7PM-7AM, please contact night-coverage [email or web address] Password [facility] 08/18/2016, 3:03 PM

## 2016-08-18 NOTE — Progress Notes (Signed)
ANTICOAGULATION CONSULT NOTE -Follow up Pharmacy Consult for heparin Indication: atrial fibrillation  Allergies  Allergen Reactions  . Clarithromycin Other (See Comments)    REACTION: possible rash but could have been legionarres dz with rash  . Lipitor [Atorvastatin] Other (See Comments)    MUSCLE SPASMS  . Simvastatin Other (See Comments)    Muscle and joint pain  . Crestor [Rosuvastatin Calcium]     fagitue and joint pain, "NO STATINS"    Patient Measurements: Height: 5\' 4"  (162.6 cm) Weight: 138 lb 3.2 oz (62.7 kg) (scale c) IBW/kg (Calculated) : 54.7  Vital Signs: Temp: 98.8 F (37.1 C) (09/14 0800) Temp Source: Oral (09/14 0800) BP: 101/68 (09/14 0800) Pulse Rate: 76 (09/14 0800)  Labs:  Recent Labs  08/17/16 1448 08/17/16 1926 08/18/16 0001 08/18/16 0532 08/18/16 0908  HGB 11.0*  --  10.7* 10.3*  --   HCT 34.6*  --  34.3* 32.6*  --   PLT 181  --  215 199  --   APTT  --   --   --   --  46*  LABPROT 15.4*  --   --   --   --   INR 1.21  --   --   --   --   HEPARINUNFRC  --   --   --   --  1.24*  CREATININE 1.11*  --   --  1.12*  --   TROPONINI  --  <0.03 <0.03 <0.03  --     Estimated Creatinine Clearance: 36.3 mL/min (by C-G formula based on SCr of 1.12 mg/dL (H)).   Medical History: Past Medical History:  Diagnosis Date  . Allergic rhinitis   . Allergy   . Anxiety   . Basal cell carcinoma   . Cataract    bil cataracts removed  . COPD (chronic obstructive pulmonary disease) ([place])   . Depression 11/29/2013  . DI (detrusor instability)   . Dysrhythmia   . Esophageal stricture   . GERD (gastroesophageal reflux disease)   . Hemorrhoids   . Hiatal hernia   . History of cerebrovascular accident   . History of kidney stones   . HLD (hyperlipidemia)   . HTN (hypertension)   . Nephrolithiasis    hx  . Osteoarthritis   . Pneumonia 2013  . Shingles since Nov 18, 2013   on right arm from shoulder to wrist  . Shortness of breath dyspnea    WITH  EXERTION   . Status post dilation of esophageal narrowing   . Stroke [facility]) 2001   STROKES, TIA'S  . TIA (transient ischemic attack) last 2001   anticoagulation therapy on coumadin    Medications:  Prescriptions Prior to Admission  Medication Sig Dispense Refill Last Dose  . albuterol (PROVENTIL HFA;VENTOLIN HFA) 108 (90 BASE) MCG/ACT inhaler Inhale 1-2 puffs into the lungs every 6 (six) hours as needed for wheezing or shortness of breath. 3 Inhaler 4 08/16/2016 at pm  . apixaban (ELIQUIS) 5 MG TABS tablet Take 1 tablet (5 mg total) by mouth 2 (two) times daily. 60 tablet 0 08/17/2016 at 0530  . azelastine (ASTELIN) 0.1 % nasal spray Place 2 sprays into the nose daily. Use in each nostril as directed (Patient taking differently: Place 2 sprays into both nostrils daily. ) 90 mL 1 08/17/2016 at am  . benzonatate (TESSALON) 100 MG capsule Take 1 capsule (100 mg total) by mouth 3 (three) times daily as needed for cough. 90 capsule 0 Past Week  at Unknown time  . budesonide-formoterol (SYMBICORT) 160-4.5 MCG/ACT inhaler Inhale 2 puffs into the lungs 2 (two) times daily. 3 Inhaler 4 08/17/2016 at am  . Calcium Carbonate-Vitamin D (CALCIUM + D PO) Take 1 tablet by mouth 2 (two) times daily.    08/17/2016 at am  . citalopram (CELEXA) 20 MG tablet Take 1 tablet (20 mg total) by mouth daily. 90 tablet 3 08/17/2016 at am  . cyclobenzaprine (FLEXERIL) 5 MG tablet Take 1 tablet (5 mg total) by mouth 3 (three) times daily as needed for muscle spasms. 270 tablet 0 Past Month at Unknown time  . diltiazem (CARDIZEM CD) 300 MG 24 hr capsule Take 1 capsule (300 mg total) by mouth daily. 90 capsule 0 08/17/2016 at am  . ezetimibe (ZETIA) 10 MG tablet Take 1 tablet (10 mg total) by mouth daily. 90 tablet 3 08/17/2016 at am  . fluticasone (FLONASE) 50 MCG/ACT nasal spray Place 1 spray into both nostrils 2 (two) times daily. 48 g 1 08/17/2016 at am  . furosemide (LASIX) 20 MG tablet Take 1 tablet (20 mg total) by mouth daily.  (Patient taking differently: Take 20 mg by mouth every morning. ) 90 tablet 0 08/17/2016 at am  . guaiFENesin (MUCINEX) 600 MG 12 hr tablet Take 1,200 mg by mouth 2 (two) times daily as needed for cough or to loosen phlegm.   08/17/2016 at am  . Lidocaine HCl 2 % CREA Apply 1 application topically daily as needed (for shingles flares).     . LORazepam (ATIVAN) 0.5 MG tablet Take 1 tablet (0.5 mg total) by mouth 2 (two) times daily as needed for anxiety. 15 tablet 0 Past Month at Unknown time  . Magnesium 250 MG TABS Take 1 tablet by mouth daily as needed. Leg cramps   Past Week at Unknown time  . Melatonin 1 MG TABS Take 3 mg by mouth at bedtime.    08/16/2016 at pm  . meloxicam (MOBIC) 15 MG tablet Take 15 mg by mouth daily.   08/17/2016 at am  . metoprolol succinate (TOPROL XL) 25 MG 24 hr tablet Take 1 tablet (25 mg total) by mouth daily. 30 tablet 0 08/17/2016 at 0530  . mirabegron ER (MYRBETRIQ) 50 MG TB24 tablet Take 50 mg by mouth daily.   08/16/2016 at pm  . montelukast (SINGULAIR) 10 MG tablet Take 10 mg by mouth daily.   08/17/2016 at am  . Multiple Vitamin (MULTIVITAMIN) tablet Take 1 tablet by mouth daily.     08/17/2016 at am  . omeprazole (PRILOSEC) 40 MG capsule Take 1 capsule (40 mg total) by mouth daily. 90 capsule 3 08/17/2016 at am  . Respiratory Therapy Supplies (FLUTTER) DEVI Use 2-3 times per day 1 each 0 Taking  . Spacer/Aero-Holding Chambers (AEROCHAMBER Z-STAT PLUS) inhaler Use as instructed 1 each 0 Taking  . Tiotropium Bromide Monohydrate (SPIRIVA RESPIMAT) 2.5 MCG/ACT AERS Inhale 2 puffs into the lungs daily. 3 Inhaler 3 08/17/2016 at am  . vitamin E 400 UNIT capsule Take 400 Units by mouth daily.   08/16/2016 at pm   Scheduled:  . azelastine  2 spray Each Nare Daily  . citalopram  20 mg Oral Daily  . diltiazem  300 mg Oral Daily  . doxycycline (VIBRAMYCIN) IV  100 mg Intravenous Q12H  . doxycycline (VIBRAMYCIN) IV  100 mg Intravenous Once  . ezetimibe  10 mg Oral Daily  .  fluticasone  1 spray Each Nare BID  . furosemide  40 mg  Intravenous Daily  . Melatonin  3 mg Oral QHS  . metoprolol succinate  25 mg Oral Daily  . mirabegron ER  50 mg Oral Daily  . mometasone-formoterol  2 puff Inhalation BID  . montelukast  10 mg Oral Daily  . multivitamin with minerals  1 tablet Oral Daily  . pantoprazole  40 mg Oral Daily  . tiotropium  1 capsule Inhalation Daily  . vitamin E  400 Units Oral Daily    Assessment: 75yo female on apixaban PTA for Afib (last dose 9/13 0530) presents w/ hemoptysis (described as two large rust-colored clots) and acute on chronic CHF. Transitioned to heparin IV drip early this AM , started dose conservatively 2/2 possible hemoptysis but will broaden goal if resolved. We are monitoring  aPTT while Eliquis is being cleared,  Initial 8 hr aPTT = 46 seconds,   Heparin level 1.24 (high due to effect of previously taking Eliquis).  Hgb 10.3, pltc 199k, no bleeding reported.    Goal of Therapy:  Heparin level 0.3-0.5 units/ml aPTT 66-84 seconds Monitor platelets by anticoagulation protocol: Yes   Plan:  Increase heparin gtt to 1000 units/hr  Check 6 hour aPTT/heparin level  Monitor daily  aPTT while Eliquis is being cleared, heparin leve and CBC.  Nicole Cella, RPh Clinical Pharmacist Pager: [phone] 08/18/2016,10:57 AM

## 2016-08-18 NOTE — Progress Notes (Deleted)
Cardiology Office Note:    Date:  08/18/2016   ID:  [name], [name] October 03, 1941, MRN [id number]  PCP:  Cathlean Cower, MD  Cardiologist:  Dr. Dorris Ray   Electrophysiologist:  n/a  Referring MD: Lauren Borg, MD   No chief complaint on file. FU Bradycardia *** History of Present Illness:    [name] is a 75 y.o. 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  She is on chronic Coumadin.  TEE 12/03: EF 55-65%, trace AI, mild MR, negative bubble study, positive atrial septal aneurysm.  She also has a history of hypertension, hyperlipidemia, GERD and COPD as well as venous insufficiency, status post prior ablative therapy by Dr. Donnetta Ray.  It has been noted in the past that she definitely needs cross bridging therapy anytime that she comes off of Coumadin.  Prior CV studies that were reviewed today include:    Myoview ***  Echo 06/24/16 - Left ventricle: The cavity size was at the upper limits of   normal. Wall thickness was normal. Systolic function was mildly   reduced. The estimated ejection fraction was in the range of 45%   to 50%. Diffuse hypokinesis. Features are consistent with a   pseudonormal left ventricular filling pattern, with concomitant   abnormal relaxation and increased filling pressure (grade 2   diastolic dysfunction). - Ventricular septum: Septal motion showed paradox. These changes   are consistent with intraventricular conduction delay. - Mitral valve: There was moderate regurgitation directed   centrally. - Left atrium: The atrium was moderately to severely dilated. - Atrial septum: The septum bowed from left to right, consistent   with increased left atrial pressure. No defect or patent foramen   ovale was identified.  Impressions:  - Incessant PVCs seen throughout the study.  Event Monitor 7/17 1. NSR with PVC's, at times in a bigeminal manner 2. Signal drop out as  well as noise artifact are present.  3. No sustained atrial or ventricular arrhythmias 4. No pauses  Past Medical History:  Diagnosis Date  . Allergic rhinitis   . Allergy   . Anxiety   . Basal cell carcinoma   . Cataract    bil cataracts removed  . COPD (chronic obstructive pulmonary disease) ([place])   . Depression 11/29/2013  . DI (detrusor instability)   . Dysrhythmia   . Esophageal stricture   . GERD (gastroesophageal reflux disease)   . Hemorrhoids   . Hiatal hernia   . History of cerebrovascular accident   . History of kidney stones   . HLD (hyperlipidemia)   . HTN (hypertension)   . Nephrolithiasis    hx  . Osteoarthritis   . Pneumonia 2013  . Shingles since Nov 18, 2013   on right arm from shoulder to wrist  . Shortness of breath dyspnea    WITH EXERTION   . Status post dilation of esophageal narrowing   . Stroke [facility]) 2001   STROKES, TIA'S  . TIA (transient ischemic attack) last 2001   anticoagulation therapy on coumadin    Past Surgical History:  Procedure Laterality Date  . ANTERIOR AND POSTERIOR VAGINAL REPAIR  2008   A&P REPAIR-DR MCDIARMID  . ANTERIOR LATERAL LUMBAR FUSION 4 LEVELS Right 07/03/2015   Procedure: Right Lumbar one-two, Lumbar two-three, Lumbar three-four, Lumbar four-five Anterior lateral lumbar interbody fusion;  Surgeon: Erline Levine, MD;  Location: [facility]  NEURO ORS;  Service: Neurosurgery;  Laterality: Right;  Right L1-2 L2-3 L3-4 L4-5 Anterior lateral lumbar interbody fusion  . BALLOON DILATION N/A 03/03/2014   Procedure: BALLOON DILATION;  Surgeon: Inda Castle, MD;  Location: [facility] ENDOSCOPY;  Service: Endoscopy;  Laterality: N/A;  . BLADDER SUSPENSION    . CARDIOVERSION N/A 08/01/2016   Procedure: CARDIOVERSION;  Surgeon: Jerline Pain, MD;  Location[facility] ENDOSCOPY;  Service: Cardiovascular;  Laterality: N/A;  . COLONOSCOPY    . ESOPHAGOGASTRODUODENOSCOPY N/A 03/03/2014   Procedure: ESOPHAGOGASTRODUODENOSCOPY (EGD);  Surgeon: Inda Castle, MD;  Location: [facility] ENDOSCOPY;  Service: Endoscopy;  Laterality: N/A;  . EXCISION OF BASAL CELL CA  2011  . GLUTEUS MINIMUS REPAIR Right 03/15/2016   Procedure: RIGHT HIP GLUTEUS MEDIUS TENDON REPAIR;  Surgeon: Paralee Cancel, MD;  Location: [facility] ORS;  Service: Orthopedics;  Laterality: Right;  . LUMBAR PERCUTANEOUS PEDICLE SCREW 4 LEVEL Right 07/03/2015   Procedure: Lumbar one-Sacral one Bilateral percutaneous pedicle screws;  Surgeon: Erline Levine, MD;  Location: [place] NEURO ORS;  Service: Neurosurgery;  Laterality: Right;  L1-S1 Bilateral percutaneous pedicle screws  . RECTOCELE REPAIR  2008   A&P REPAIR -DR. MCDIARMID  . THUMB SURGERY Right 10-15 yrs ago  . UPPER GASTROINTESTINAL ENDOSCOPY    . VAGINAL HYSTERECTOMY  1983   NO BSO-DR. MABRY  . VARICOSE VEIN SURGERY      Current Medications: Facility-Administered Medications Prior to Visit  Medication Dose Route Frequency Provider Last Rate Last Dose  . acetaminophen (TYLENOL) tablet 650 mg  650 mg Oral Q6H PRN Rise Patience, MD       Or  . acetaminophen (TYLENOL) suppository 650 mg  650 mg Rectal Q6H PRN Rise Patience, MD      . albuterol (PROVENTIL) (2.5 MG/3ML) 0.083% nebulizer solution 3 mL  3 mL Inhalation Q4H PRN Mariel Aloe, MD      . azelastine (ASTELIN) 0.1 % nasal spray 2 spray  2 spray Each Nare Daily Rise Patience, MD   2 spray at 08/18/16 1106  . benzonatate (TESSALON) capsule 100 mg  100 mg Oral TID PRN Rise Patience, MD      . citalopram (CELEXA) tablet 20 mg  20 mg Oral Daily Rise Patience, MD   20 mg at 08/18/16 1102  . cyclobenzaprine (FLEXERIL) tablet 5 mg  5 mg Oral TID PRN Rise Patience, MD   5 mg at 08/18/16 2148  . diltiazem (CARDIZEM CD) 24 hr capsule 300 mg  300 mg Oral Daily Rise Patience, MD   300 mg at 08/18/16 1102  . doxycycline (VIBRAMYCIN) 100 mg in dextrose 5 % 250 mL IVPB  100 mg Intravenous Q12H Rise Patience, MD   100 mg at 08/18/16 1755  . ezetimibe  (ZETIA) tablet 10 mg  10 mg Oral Daily Rise Patience, MD   10 mg at 08/18/16 1101  . fluticasone (FLONASE) 50 MCG/ACT nasal spray 1 spray  1 spray Each Nare BID Rise Patience, MD   1 spray at 08/18/16 2150  . furosemide (LASIX) injection 40 mg  40 mg Intravenous Daily Rise Patience, MD   40 mg at 08/18/16 1050  . guaiFENesin (MUCINEX) 12 hr tablet 1,200 mg  1,200 mg Oral BID PRN Rise Patience, MD      . heparin ADULT infusion 100 units/mL (25000 units/243mL sodium chloride 0.45%)  1,100 Units/hr Intravenous Continuous Mariel Aloe, MD   1,100 Units/hr at 08/18/16  2126  . ipratropium-albuterol (DUONEB) 0.5-2.5 (3) MG/3ML nebulizer solution 3 mL  3 mL Nebulization Q6H Mariel Aloe, MD   3 mL at 08/18/16 1931  . Lidocaine HCl 2 % CREA 1 application  1 application Apply externally Daily PRN Rise Patience, MD      . LORazepam (ATIVAN) tablet 0.5 mg  0.5 mg Oral BID PRN Rise Patience, MD      . Melatonin TABS 3 mg  3 mg Oral QHS Rise Patience, MD   3 mg at 08/18/16 2148  . metoprolol succinate (TOPROL-XL) 24 hr tablet 25 mg  25 mg Oral Daily Rise Patience, MD   25 mg at 08/18/16 1103  . mirabegron ER (MYRBETRIQ) tablet 50 mg  50 mg Oral Daily Rise Patience, MD   50 mg at 08/18/16 2149  . mometasone-formoterol (DULERA) 200-5 MCG/ACT inhaler 2 puff  2 puff Inhalation BID Rise Patience, MD   2 puff at 08/18/16 1931  . montelukast (SINGULAIR) tablet 10 mg  10 mg Oral Daily Rise Patience, MD   10 mg at 08/18/16 1103  . multivitamin with minerals tablet 1 tablet  1 tablet Oral Daily Rise Patience, MD   1 tablet at 08/18/16 1104  . ondansetron (ZOFRAN) tablet 4 mg  4 mg Oral Q6H PRN Rise Patience, MD       Or  . ondansetron [facility]) injection 4 mg  4 mg Intravenous Q6H PRN Rise Patience, MD      . pantoprazole (PROTONIX) EC tablet 40 mg  40 mg Oral Daily Rise Patience, MD   40 mg at 08/18/16 1104  . vitamin E capsule  400 Units  400 Units Oral Daily Rise Patience, MD   400 Units at 08/18/16 1104   Outpatient Medications Prior to Visit  Medication Sig Dispense Refill  . albuterol (PROVENTIL HFA;VENTOLIN HFA) 108 (90 BASE) MCG/ACT inhaler Inhale 1-2 puffs into the lungs every 6 (six) hours as needed for wheezing or shortness of breath. 3 Inhaler 4  . apixaban (ELIQUIS) 5 MG TABS tablet Take 1 tablet (5 mg total) by mouth 2 (two) times daily. 60 tablet 0  . azelastine (ASTELIN) 0.1 % nasal spray Place 2 sprays into the nose daily. Use in each nostril as directed (Patient taking differently: Place 2 sprays into both nostrils daily. ) 90 mL 1  . benzonatate (TESSALON) 100 MG capsule Take 1 capsule (100 mg total) by mouth 3 (three) times daily as needed for cough. 90 capsule 0  . budesonide-formoterol (SYMBICORT) 160-4.5 MCG/ACT inhaler Inhale 2 puffs into the lungs 2 (two) times daily. 3 Inhaler 4  . Calcium Carbonate-Vitamin D (CALCIUM + D PO) Take 1 tablet by mouth 2 (two) times daily.     . citalopram (CELEXA) 20 MG tablet Take 1 tablet (20 mg total) by mouth daily. 90 tablet 3  . cyclobenzaprine (FLEXERIL) 5 MG tablet Take 1 tablet (5 mg total) by mouth 3 (three) times daily as needed for muscle spasms. 270 tablet 0  . diltiazem (CARDIZEM CD) 300 MG 24 hr capsule Take 1 capsule (300 mg total) by mouth daily. 90 capsule 0  . ezetimibe (ZETIA) 10 MG tablet Take 1 tablet (10 mg total) by mouth daily. 90 tablet 3  . fluticasone (FLONASE) 50 MCG/ACT nasal spray Place 1 spray into both nostrils 2 (two) times daily. 48 g 1  . furosemide (LASIX) 20 MG tablet Take 1 tablet (20 mg total)  by mouth daily. (Patient taking differently: Take 20 mg by mouth every morning. ) 90 tablet 0  . guaiFENesin (MUCINEX) 600 MG 12 hr tablet Take 1,200 mg by mouth 2 (two) times daily as needed for cough or to loosen phlegm.    . Lidocaine HCl 2 % CREA Apply 1 application topically daily as needed (for shingles flares).    . LORazepam  (ATIVAN) 0.5 MG tablet Take 1 tablet (0.5 mg total) by mouth 2 (two) times daily as needed for anxiety. 15 tablet 0  . Magnesium 250 MG TABS Take 1 tablet by mouth daily as needed. Leg cramps    . Melatonin 1 MG TABS Take 3 mg by mouth at bedtime.     . meloxicam (MOBIC) 15 MG tablet Take 15 mg by mouth daily.    . metoprolol succinate (TOPROL XL) 25 MG 24 hr tablet Take 1 tablet (25 mg total) by mouth daily. 30 tablet 0  . mirabegron ER (MYRBETRIQ) 50 MG TB24 tablet Take 50 mg by mouth daily.    . montelukast (SINGULAIR) 10 MG tablet Take 10 mg by mouth daily.    . Multiple Vitamin (MULTIVITAMIN) tablet Take 1 tablet by mouth daily.      Marland Kitchen omeprazole (PRILOSEC) 40 MG capsule Take 1 capsule (40 mg total) by mouth daily. 90 capsule 3  . Respiratory Therapy Supplies (FLUTTER) DEVI Use 2-3 times per day 1 each 0  . Spacer/Aero-Holding Chambers (AEROCHAMBER Z-STAT PLUS) inhaler Use as instructed 1 each 0  . Tiotropium Bromide Monohydrate (SPIRIVA RESPIMAT) 2.5 MCG/ACT AERS Inhale 2 puffs into the lungs daily. 3 Inhaler 3  . vitamin E 400 UNIT capsule Take 400 Units by mouth daily.        Allergies:   Clarithromycin; Lipitor [atorvastatin]; Simvastatin; and Crestor [rosuvastatin calcium]   Social History   Social History  . Marital status: Married    Spouse name: N/A  . Number of children: 2  . Years of education: N/A   Occupational History  . retired Retired   Social History Main Topics  . Smoking status: Former Smoker    Packs/day: 3.00    Years: 42.00    Types: Cigarettes    Start date: 08/04/1954    Quit date: 12/06/1995  . Smokeless tobacco: Never Used  . Alcohol use 0.0 oz/week     Comment: occas very little  . Drug use: No  . Sexual activity: Not Currently    Birth control/ protection: Surgical     Comment: HYST-1st intercourse 44 yo-2 partners   Other Topics Concern  . Not on file   Social History Narrative   Retired- Dentist  Pulmonary:   Originally from [place]. Has always lived in [place]. She has traveled to [place], [place], [place], [place], [place], [place]. Has been on multiple cruises. 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 She did use a face mask consistently to avoid chemical fume exposure. She may have only had an inhaled exposure a couple of times. She currently has a dog. Remote exposure to a parakeet. No mold or hot tub exposure. No asbestos exposure.      Family History:  The patient's ***family history includes Breast cancer in her maternal aunt and mother; COPD in her mother; Emphysema in her mother; Uterine cancer in her mother.   ROS:   Please see the history of present illness.  ROS All other systems reviewed and are negative.   EKGs/Labs/Other Test Reviewed:    EKG:  EKG is *** ordered today.  The ekg ordered today demonstrates ***  Recent Labs: 07/27/2016: TSH 0.568 08/10/2016: ALT 19 08/17/2016: B Natriuretic Peptide 343.5 08/18/2016: BUN 13; Creatinine, Ser 1.12; Hemoglobin 11.0; Magnesium 2.1; Platelets 235; Potassium 4.0; Sodium 138   Recent Lipid Panel    Component Value Date/Time   CHOL 239 (H) 11/12/2015 1142   TRIG 122.0 11/12/2015 1142   HDL 49.70 11/12/2015 1142   CHOLHDL 5 11/12/2015 1142   VLDL 24.4 11/12/2015 1142   LDLCALC 165 (H) 11/12/2015 1142   LDLDIRECT 241.1 12/02/2014 1528     Physical Exam:    VS:  LMP  (Exact Date)     Wt Readings from Last 3 Encounters:  08/18/16 138 lb 3.2 oz (62.7 kg)  08/17/16 141 lb (64 kg)  08/12/16 141 lb 4.8 oz (64.1 kg)     ***Physical Exam  ASSESSMENT:    No diagnosis found. PLAN:    In order of problems listed above:  ***   Medication Adjustments/Labs and Tests Ordered: Current medicines are reviewed at length with the patient today.  Concerns regarding medicines are outlined above.  Medication changes, Labs and Tests ordered today are outlined in  the Patient Instructions noted below. There are no Patient Instructions on file for this visit. Signed, Richardson Dopp, PA-C  08/18/2016 10:02 PM    [facility] [address], [place], [place]  [address] Phone: [phone]; Fax: [phone]

## 2016-08-18 NOTE — Care Management Obs Status (Signed)
[place] NOTIFICATION   Patient Details  Name: [name] MRN: [id number] Date of Birth: Apr 08, 1941   Medicare Observation Status Notification Given:  Yes    MayoKym Groom, RN 08/18/2016, 10:41 AM

## 2016-08-18 NOTE — Progress Notes (Addendum)
ANTICOAGULATION CONSULT NOTE -Follow up Pharmacy Consult for heparin Indication: atrial fibrillation  Allergies  Allergen Reactions  . Clarithromycin Other (See Comments)    REACTION: possible rash but could have been legionarres dz with rash  . Lipitor [Atorvastatin] Other (See Comments)    MUSCLE SPASMS  . Simvastatin Other (See Comments)    Muscle and joint pain  . Crestor [Rosuvastatin Calcium]     fagitue and joint pain, "NO STATINS"    Patient Measurements: Height: 5\' 4"  (162.6 cm) Weight: 138 lb 3.2 oz (62.7 kg) (scale c) IBW/kg (Calculated) : 54.7  Vital Signs: Temp: 98.8 F (37.1 C) (09/14 0800) Temp Source: Oral (09/14 0800) BP: 104/73 (09/14 1115) Pulse Rate: 76 (09/14 0800)  Labs:  Recent Labs  08/17/16 1448  08/18/16 0001 08/18/16 0532 08/18/16 0908 08/18/16 1216 08/18/16 1757 08/18/16 1758  HGB 11.0*  --  10.7* 10.3*  --  11.0*  --   --   HCT 34.6*  --  34.3* 32.6*  --  34.9*  --   --   PLT 181  --  215 199  --  235  --   --   APTT  --   --   --   --  46*  --   --  63*  LABPROT 15.4*  --   --   --   --   --   --   --   INR 1.21  --   --   --   --   --   --   --   HEPARINUNFRC  --   --   --   --  1.24*  --  1.07*  --   CREATININE 1.11*  --   --  1.12*  --   --   --   --   TROPONINI  --   < > <0.03 <0.03  --  <0.03  --   --   < > = values in this interval not displayed.  Estimated Creatinine Clearance: 36.3 mL/min (by C-G formula based on SCr of 1.12 mg/dL (H)).  Medical History: Past Medical History:  Diagnosis Date  . Allergic rhinitis   . Allergy   . Anxiety   . Basal cell carcinoma   . Cataract    bil cataracts removed  . COPD (chronic obstructive pulmonary disease) ([place])   . Depression 11/29/2013  . DI (detrusor instability)   . Dysrhythmia   . Esophageal stricture   . GERD (gastroesophageal reflux disease)   . Hemorrhoids   . Hiatal hernia   . History of cerebrovascular accident   . History of kidney stones   . HLD  (hyperlipidemia)   . HTN (hypertension)   . Nephrolithiasis    hx  . Osteoarthritis   . Pneumonia 2013  . Shingles since Nov 18, 2013   on right arm from shoulder to wrist  . Shortness of breath dyspnea    WITH EXERTION   . Status post dilation of esophageal narrowing   . Stroke [facility]) 2001   STROKES, TIA'S  . TIA (transient ischemic attack) last 2001   anticoagulation therapy on coumadin    Medications:  Prescriptions Prior to Admission  Medication Sig Dispense Refill Last Dose  . albuterol (PROVENTIL HFA;VENTOLIN HFA) 108 (90 BASE) MCG/ACT inhaler Inhale 1-2 puffs into the lungs every 6 (six) hours as needed for wheezing or shortness of breath. 3 Inhaler 4 08/16/2016 at pm  . apixaban (ELIQUIS) 5 MG TABS tablet Take  1 tablet (5 mg total) by mouth 2 (two) times daily. 60 tablet 0 08/17/2016 at 0530  . azelastine (ASTELIN) 0.1 % nasal spray Place 2 sprays into the nose daily. Use in each nostril as directed (Patient taking differently: Place 2 sprays into both nostrils daily. ) 90 mL 1 08/17/2016 at am  . benzonatate (TESSALON) 100 MG capsule Take 1 capsule (100 mg total) by mouth 3 (three) times daily as needed for cough. 90 capsule 0 Past Week at Unknown time  . budesonide-formoterol (SYMBICORT) 160-4.5 MCG/ACT inhaler Inhale 2 puffs into the lungs 2 (two) times daily. 3 Inhaler 4 08/17/2016 at am  . Calcium Carbonate-Vitamin D (CALCIUM + D PO) Take 1 tablet by mouth 2 (two) times daily.    08/17/2016 at am  . citalopram (CELEXA) 20 MG tablet Take 1 tablet (20 mg total) by mouth daily. 90 tablet 3 08/17/2016 at am  . cyclobenzaprine (FLEXERIL) 5 MG tablet Take 1 tablet (5 mg total) by mouth 3 (three) times daily as needed for muscle spasms. 270 tablet 0 Past Month at Unknown time  . diltiazem (CARDIZEM CD) 300 MG 24 hr capsule Take 1 capsule (300 mg total) by mouth daily. 90 capsule 0 08/17/2016 at am  . ezetimibe (ZETIA) 10 MG tablet Take 1 tablet (10 mg total) by mouth daily. 90 tablet 3  08/17/2016 at am  . fluticasone (FLONASE) 50 MCG/ACT nasal spray Place 1 spray into both nostrils 2 (two) times daily. 48 g 1 08/17/2016 at am  . furosemide (LASIX) 20 MG tablet Take 1 tablet (20 mg total) by mouth daily. (Patient taking differently: Take 20 mg by mouth every morning. ) 90 tablet 0 08/17/2016 at am  . guaiFENesin (MUCINEX) 600 MG 12 hr tablet Take 1,200 mg by mouth 2 (two) times daily as needed for cough or to loosen phlegm.   08/17/2016 at am  . Lidocaine HCl 2 % CREA Apply 1 application topically daily as needed (for shingles flares).     . LORazepam (ATIVAN) 0.5 MG tablet Take 1 tablet (0.5 mg total) by mouth 2 (two) times daily as needed for anxiety. 15 tablet 0 Past Month at Unknown time  . Magnesium 250 MG TABS Take 1 tablet by mouth daily as needed. Leg cramps   Past Week at Unknown time  . Melatonin 1 MG TABS Take 3 mg by mouth at bedtime.    08/16/2016 at pm  . meloxicam (MOBIC) 15 MG tablet Take 15 mg by mouth daily.   08/17/2016 at am  . metoprolol succinate (TOPROL XL) 25 MG 24 hr tablet Take 1 tablet (25 mg total) by mouth daily. 30 tablet 0 08/17/2016 at 0530  . mirabegron ER (MYRBETRIQ) 50 MG TB24 tablet Take 50 mg by mouth daily.   08/16/2016 at pm  . montelukast (SINGULAIR) 10 MG tablet Take 10 mg by mouth daily.   08/17/2016 at am  . Multiple Vitamin (MULTIVITAMIN) tablet Take 1 tablet by mouth daily.     08/17/2016 at am  . omeprazole (PRILOSEC) 40 MG capsule Take 1 capsule (40 mg total) by mouth daily. 90 capsule 3 08/17/2016 at am  . Respiratory Therapy Supplies (FLUTTER) DEVI Use 2-3 times per day 1 each 0 Taking  . Spacer/Aero-Holding Chambers (AEROCHAMBER Z-STAT PLUS) inhaler Use as instructed 1 each 0 Taking  . Tiotropium Bromide Monohydrate (SPIRIVA RESPIMAT) 2.5 MCG/ACT AERS Inhale 2 puffs into the lungs daily. 3 Inhaler 3 08/17/2016 at am  . vitamin E 400 UNIT capsule  Take 400 Units by mouth daily.   08/16/2016 at pm   Scheduled:  . azelastine  2 spray Each Nare  Daily  . citalopram  20 mg Oral Daily  . diltiazem  300 mg Oral Daily  . doxycycline (VIBRAMYCIN) IV  100 mg Intravenous Q12H  . ezetimibe  10 mg Oral Daily  . fluticasone  1 spray Each Nare BID  . furosemide  40 mg Intravenous Daily  . ipratropium-albuterol  3 mL Nebulization Q6H  . Melatonin  3 mg Oral QHS  . metoprolol succinate  25 mg Oral Daily  . mirabegron ER  50 mg Oral Daily  . mometasone-formoterol  2 puff Inhalation BID  . montelukast  10 mg Oral Daily  . multivitamin with minerals  1 tablet Oral Daily  . pantoprazole  40 mg Oral Daily  . vitamin E  400 Units Oral Daily   Assessment: 75yo female on apixaban PTA for Afib (last dose 9/13 0530) presents w/ hemoptysis (described as two large rust-colored clots) and acute on chronic CHF. Transitioned to heparin IV drip early this AM , started dose conservatively 2/2 possible hemoptysis but will broaden goal if resolved. We are monitoring  aPTT while Eliquis is being cleared,  Initial 8 hr aPTT = 46 seconds with Heparin level 1.24 (high due to effect of previously taking Eliquis). Repeat aPTT = 63 subtherapeutic.  Hgb 10.3, pltc 199k, no bleeding reported.   Goal of Therapy:  Heparin level 0.3-0.5 units/ml aPTT 66-84 seconds Monitor platelets by anticoagulation protocol: Yes   Plan:  Increase heparin gtt to 1100 units/hr  Verify aPTT with morning labs Monitor daily  aPTT while Eliquis is being cleared, heparin level and CBC.  Georga Bora, PharmD Clinical Pharmacist Pager: [phone] 08/18/2016 7:41 PM   Addendum -aptt goal 66-102 due to resolution of hemoptysis -aptt wnl -continue heparin at 1100 units/hr  Harvel Quale  08/19/2016 5:54 AM

## 2016-08-18 NOTE — Consult Note (Signed)
CARDIOLOGY CONSULT NOTE   Patient ID: [name] MRN: [id number] DOB/AGE: 1941-06-05 75 y.o.  Admit date: 08/17/2016  Primary Physician   Cathlean Cower, MD Primary Cardiologist   Dr Harrington Challenger Reason for Consultation   CHF Requesting MD: Dr Hal Hope  [id number] C Ray is a 75 y.o. 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 COPD- followed by Dr Ashok Cordia, (she was recently placed on steroids for a cough), GERD s/p esophageal dilatation May 2017, HTN with EF 45% grade 2 DD by echo 06/2016, HLD, hx bradycardia and PVCs.  S-D-CHF, HTN, HLD.  D/c 08/29 after admit for resp failure, diagnosed w/ afib (RVR) s/p DCCV>SR>> Atrial fib, also w/ CHF exacerbation.  D/c 09/07 after admit for syncope after Lasix increased for edema, afib RVR and CHF exacerbation. Weight at d/c 141. F/u scheduled for 09/15.   Pt admitted 09/13 w/ hemoptysis, coughed up 2 clots and had earlier had rust-colored sputum. She had had resting images for MV that am (unable to do stress images because she had caffeine). Weight 142. CXR did not show edema, but CTA showed possible edema. She had Lasix last pm and this am and has lost 5 lbs, feels much better now. Still on O2, which she does not have at home. She was not wheezing until she got here.  The whole time she was home, she was struggling to breathe. Any activity would drop her O2 sats into the 80s. Her heart rate was pretty well controlled. Occasionally it would drop into the 40s, but this did not last long and caused no symptoms.   She was having problems with her BP dropping after am medications, including Dilt 300 mg and Toprol XL 25 mg. She has been off her ARB since August admit.    Past Medical History:  Diagnosis Date  . Allergic rhinitis   . Allergy   . Anxiety   . Basal cell carcinoma   . Cataract    bil cataracts removed  . COPD (chronic obstructive pulmonary disease) ([place])   .  Depression 11/29/2013  . DI (detrusor instability)   . Dysrhythmia   . Esophageal stricture   . GERD (gastroesophageal reflux disease)   . Hemorrhoids   . Hiatal hernia   . History of cerebrovascular accident   . History of kidney stones   . HLD (hyperlipidemia)   . HTN (hypertension)   . Nephrolithiasis    hx  . Osteoarthritis   . Pneumonia 2013  . Shingles since Nov 18, 2013   on right arm from shoulder to wrist  . Shortness of breath dyspnea    WITH EXERTION   . Status post dilation of esophageal narrowing   . Stroke [facility]) 2001   STROKES, TIA'S  . TIA (transient ischemic attack) last 2001   anticoagulation therapy on coumadin     Past Surgical History:  Procedure Laterality Date  . ANTERIOR AND POSTERIOR VAGINAL REPAIR  2008   A&P REPAIR-DR MCDIARMID  . ANTERIOR LATERAL LUMBAR FUSION 4 LEVELS Right 07/03/2015   Procedure: Right Lumbar one-two, Lumbar two-three, Lumbar three-four, Lumbar four-five Anterior lateral lumbar interbody fusion;  Surgeon: Erline Levine, MD;  Location: [place] [facility]UR[facility] ORS;  Service: Neurosurgery;  Laterality: Right;  Right L1-2 L2-3 L3-4 L4-5 Anterior lateral lumbar interbody fusion  . BALLOON DILATION N/A 03/03/2014   Procedure: BALLOON DILATION;  Surgeon: Inda Castle, MD;  Location: [facility] ENDOSCOPY;  Service: Endoscopy;  Laterality: N/A;  . BLADDER SUSPENSION    . CARDIOVERSION N/A 08/01/2016   Procedure: CARDIOVERSION;  Surgeon: Jerline Pain, MD;  Location: [facility] ENDOSCOPY;  Service: Cardiovascular;  Laterality: N/A;  . COLONOSCOPY    . ESOPHAGOGASTRODUODENOSCOPY N/A 03/03/2014   Procedure: ESOPHAGOGASTRODUODENOSCOPY (EGD);  Surgeon: Inda Castle, MD;  Location: [facility];  Service: Endoscopy;  Laterality: N/A;  . EXCISION OF BASAL CELL CA  2011  . GLUTEUS MINIMUS REPAIR Right 03/15/2016   Procedure: RIGHT HIP GLUTEUS MEDIUS TENDON REPAIR;  Surgeon: Paralee Cancel, MD;  Location: [facility] ORS;  Service: Orthopedics;  Laterality: Right;  . LUMBAR PERCUTANEOUS  PEDICLE SCREW 4 LEVEL Right 07/03/2015   Procedure: Lumbar one-Sacral one Bilateral percutaneous pedicle screws;  Surgeon: Erline Levine, MD;  Location: [facility]URO ORS;  Service: Neurosurgery;  Laterality: Right;  L1-S1 Bilateral percutaneous pedicle screws  . RECTOCELE REPAIR  2008   A&P REPAIR -DR. MCDIARMID  . THUMB SURGERY Right 10-15 yrs ago  . UPPER GASTROINTESTINAL ENDOSCOPY    . VAGINAL HYSTERECTOMY  1983   NO BSO-DR. MABRY  . VARICOSE VEIN SURGERY     Allergies  Allergen Reactions  . Clarithromycin Other (See Comments)    REACTION: possible rash but could have been legionarres dz with rash  . Lipitor [Atorvastatin] Other (See Comments)    MUSCLE SPASMS  . Simvastatin Other (See Comments)    Muscle and joint pain  . Crestor [Rosuvastatin Calcium]     fagitue and joint pain, "NO STATINS"   I have reviewed the patient's current medications . azelastine  2 spray Each Nare Daily  . citalopram  20 mg Oral Daily  . diltiazem  300 mg Oral Daily  . doxycycline (VIBRAMYCIN) IV  100 mg Intravenous Q12H  . ezetimibe  10 mg Oral Daily  . fluticasone  1 spray Each Nare BID  . furosemide  40 mg Intravenous Daily  . Melatonin  3 mg Oral QHS  . metoprolol succinate  25 mg Oral Daily  . mirabegron ER  50 mg Oral Daily  . mometasone-formoterol  2 puff Inhalation BID  . montelukast  10 mg Oral Daily  . multivitamin with minerals  1 tablet Oral Daily  . pantoprazole  40 mg Oral Daily  . tiotropium  1 capsule Inhalation Daily  . vitamin E  400 Units Oral Daily   . heparin     acetaminophen **OR** acetaminophen, albuterol, benzonatate, cyclobenzaprine, guaiFENesin, Lidocaine HCl, LORazepam, ondansetron **OR** ondansetron (ZOFRAN) IV  Medication Sig  albuterol (PROVENTIL HFA;VENTOLIN HFA) 108 (90 BASE) MCG/ACT inhaler Inhale 1-2 puffs into the lungs every 6 (six) hours as needed for wheezing or shortness of breath.  apixaban (ELIQUIS) 5 MG TABS tablet Take 1 tablet (5 mg total) by mouth 2  (two) times daily.  azelastine (ASTELIN) 0.1 % nasal spray Place 2 sprays into the nose daily. Use in each nostril as directed Patient taking differently: Place 2 sprays into both nostrils daily.   benzonatate (TESSALON) 100 MG capsule Take 1 capsule (100 mg total) by mouth 3 (three) times daily as needed for cough.  budesonide-formoterol (SYMBICORT) 160-4.5 MCG/ACT inhaler Inhale 2 puffs into the lungs 2 (two) times daily.  Calcium Carbonate-Vitamin D (CALCIUM + D PO) Take 1 tablet by mouth 2 (two) times daily.   citalopram (CELEXA) 20 MG tablet Take 1 tablet (20 mg total) by mouth daily.  cyclobenzaprine (FLEXERIL) 5 MG tablet Take 1 tablet (5 mg total) by mouth 3 (three) times daily  as needed for muscle spasms.  diltiazem (CARDIZEM CD) 300 MG 24 hr capsule Take 1 capsule (300 mg total) by mouth daily.  ezetimibe (ZETIA) 10 MG tablet Take 1 tablet (10 mg total) by mouth daily.  fluticasone (FLONASE) 50 MCG/ACT nasal spray Place 1 spray into both nostrils 2 (two) times daily.  furosemide (LASIX) 20 MG tablet Take 1 tablet (20 mg total) by mouth daily. Patient taking differently: Take 20 mg by mouth every morning.   guaiFENesin (MUCINEX) 600 MG 12 hr tablet Take 1,200 mg by mouth 2 (two) times daily as needed for cough or to loosen phlegm.  Lidocaine HCl 2 % CREA Apply 1 application topically daily as needed (for shingles flares).  LORazepam (ATIVAN) 0.5 MG tablet Take 1 tablet (0.5 mg total) by mouth 2 (two) times daily as needed for anxiety.  Magnesium 250 MG TABS Take 1 tablet by mouth daily as needed. Leg cramps  Melatonin 1 MG TABS Take 3 mg by mouth at bedtime.   meloxicam (MOBIC) 15 MG tablet Take 15 mg by mouth daily.  metoprolol succinate (TOPROL XL) 25 MG 24 hr tablet Take 1 tablet (25 mg total) by mouth daily.  mirabegron ER (MYRBETRIQ) 50 MG TB24 tablet Take 50 mg by mouth daily.  montelukast (SINGULAIR) 10 MG tablet Take 10 mg by mouth daily.  Multiple Vitamin (MULTIVITAMIN) tablet  Take 1 tablet by mouth daily.    omeprazole (PRILOSEC) 40 MG capsule Take 1 capsule (40 mg total) by mouth daily.  Respiratory Therapy Supplies (FLUTTER) DEVI Use 2-3 times per day  Spacer/Aero-Holding Chambers (AEROCHAMBER Z-STAT PLUS) inhaler Use as instructed  Tiotropium Bromide Monohydrate (SPIRIVA RESPIMAT) 2.5 MCG/ACT AERS Inhale 2 puffs into the lungs daily.  vitamin E 400 UNIT capsule Take 400 Units by mouth daily.     Social History   Social History  . Marital status: Married    Spouse name: N/A  . Number of children: 2  . Years of education: N/A   Occupational History  . retired Retired   Social History Main Topics  . Smoking status: Former Smoker    Packs/day: 3.00    Years: 42.00    Types: Cigarettes    Start date: 08/04/1954    Quit date: 12/06/1995  . Smokeless tobacco: Never Used  . Alcohol use 0.0 oz/week     Comment: occas very little  . Drug use: No  . Sexual activity: Not Currently    Birth control/ protection: Surgical     Comment: HYST-1st intercourse 17 yo-2 partners   Other Topics Concern  . Not on file   Social History Narrative   Retired- Dentist Pulmonary:   Originally from [place]. Has always lived in [place]. She has traveled to [place], [place], [place], [place], [place], [place]. Has been on multiple cruises. 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 She did use a face mask consistently to avoid chemical fume exposure. She may have only had an inhaled exposure a couple of times. She currently has a dog. Remote exposure to a parakeet. No mold or hot tub exposure. No asbestos exposure.     Family Status  Relation Status  . Mother Deceased  . Father Deceased  . Maternal Aunt   . Neg Hx    Family History  Problem Relation Age of Onset  . COPD Mother   . Breast cancer Mother  mid 60's  . Uterine cancer Mother   . Emphysema Mother   .  Breast cancer Maternal Aunt     x 3 aunts, post menopausal  . Colon cancer Neg Hx   . Esophageal cancer Neg Hx   . Rectal cancer Neg Hx   . Stomach cancer Neg Hx   . Pancreatic cancer Neg Hx      ROS:  Full 14 point review of systems complete and found to be negative unless listed above.  Physical Exam: Blood pressure 104/73, pulse 76, temperature 98.8 F (37.1 C), temperature source Oral, resp. rate 20, height 5\' 4"  (1.626 m), weight 138 lb 3.2 oz (62.7 kg), SpO2 93 %.  General: Well developed, well nourished, female in no acute distress Head: Eyes PERRLA, No xanthomas.   Normocephalic and atraumatic, oropharynx without edema or exudate. Dentition: good Lungs: decreased BS, exp wheeze Heart: Heart irregular rate and rhythm with S1, S2, no murmur. pulses are 2+ all 4 extrem.   Neck: No carotid bruits. No lymphadenopathy.  JVD not elevated Abdomen: Bowel sounds present, abdomen soft and non-tender without masses or hernias noted. Msk:  No spine or cva tenderness. No weakness, no joint deformities or effusions. Extremities: No clubbing or cyanosis. No edema.  Neuro: Alert and oriented X 3. No focal deficits noted. Psych:  Good affect, responds appropriately Skin: No rashes or lesions noted.  Labs:   Lab Results  Component Value Date   WBC 4.3 08/18/2016   HGB 10.3 (L) 08/18/2016   HCT 32.6 (L) 08/18/2016   MCV 80.5 08/18/2016   PLT 199 08/18/2016    Recent Labs  08/17/16 1448  INR 1.21    Recent Labs Lab 08/18/16 0532  NA 138  K 4.0  CL 104  CO2 26  BUN 13  CREATININE 1.12*  CALCIUM 9.0  GLUCOSE 98   Magnesium  Date Value Ref Range Status  08/18/2016 2.1 1.7 - 2.4 mg/dL Final    Recent Labs  08/17/16 1926 08/18/16 0001 08/18/16 0532  TROPONINI <0.03 <0.03 <0.03    Recent Labs  08/17/16 1455  TROPIPOC 0.00   B Natriuretic Peptide  Date/Time Value Ref Range Status  08/17/2016 09:51 PM 343.5 (H) 0.0 - 100.0 pg/mL Final  08/10/2016 01:36 PM 447.4  (H) 0.0 - 100.0 pg/mL Final   Lab Results  Component Value Date   CHOL 239 (H) 11/12/2015   HDL 49.70 11/12/2015   LDLCALC 165 (H) 11/12/2015   TRIG 122.0 11/12/2015    Echo: 06/24/2016 - Left ventricle: The cavity size was at the upper limits of   normal. Wall thickness was normal. Systolic function was mildly   reduced. The estimated ejection fraction was in the range of 45%   to 50%. Diffuse hypokinesis. Features are consistent with a   pseudonormal left ventricular filling pattern, with concomitant   abnormal relaxation and increased filling pressure (grade 2   diastolic dysfunction). - Ventricular septum: Septal motion showed paradox. These changes   are consistent with intraventricular conduction delay. - Mitral valve: There was moderate regurgitation directed   centrally. - Left atrium: The atrium was moderately to severely dilated. Diameter 48 mm, volume 56 cc  - Atrial septum: The septum bowed from left to right, consistent   with increased left atrial pressure. No defect or patent foramen   ovale was identified. Impressions: - Incessant PVCs seen throughout the study.  ECG:  09/13 Atrial fib, rate 92 w/ PVCs ?inc LBBB w/ QRS duration 92 ms  Radiology:  Dg Chest 2 View Result Date: 08/17/2016 CLINICAL DATA:  Shortness of breath, chest heaviness and pain with weakness for the past week. Bloody sputum today. History of COPD CVA, CHF. EXAM: CHEST  2 VIEW COMPARISON:  PA and lateral chest x-ray of August 10, 2016 FINDINGS: The lungs remain hyperinflated. The interstitial markings remain increased overall. There is no alveolar infiltrate or pleural effusion. The heart and pulmonary vascularity are normal. There is a hiatal hernia. There is calcification in the wall of the aortic arch. There is mild multilevel degenerative disc disease of the thoracic spine. IMPRESSION: COPD with chronic pulmonary fibrosis. No alveolar pneumonia nor CHF. Aortic atherosclerosis. Electronically  Signed   By: David  Martinique M.D.   On: 08/17/2016 15:55   Ct Angio Chest Pe W And/or Wo Contrast Result Date: 08/17/2016 CLINICAL DATA:  Hemoptysis. Increased shortness of breath with exertion. Cough for 1 week. EXAM: CT ANGIOGRAPHY CHEST WITH CONTRAST TECHNIQUE: Multidetector CT imaging of the chest was performed using the standard protocol during bolus administration of intravenous contrast. Multiplanar CT image reconstructions and MIPs were obtained to evaluate the vascular anatomy. CONTRAST:  100 cc Isovue 370 IV COMPARISON:  Radiographs earlier this day.  Chest CT 12/19/2013 FINDINGS: Cardiovascular: There are no filling defects within the pulmonary arteries to suggest pulmonary embolus. The thoracic aorta is normal in caliber without dissection. There is moderate atherosclerosis without aneurysm. There is a conventional branching pattern from the aortic arch. Coronary artery calcifications are seen. Mediastinum/Nodes: Minimal ill-defined hilar soft tissue density likely lymphoid tissue, and discrete no wounds not seen. No mediastinal adenopathy. No pericardial effusion. Lungs/Pleura: Advanced emphysema, this is progressed from chest CT 2 and half years ago. Ill-defined ground-glass opacities throughout the right upper lobe superimposed on advanced emphysema, with minimal smooth septal thickening. Similar findings are seen in the left upper lobe and lingula to a lesser extent. There is central bronchial wall thickening. A 7 mm subpleural nodule in the right lower lobe image 110 series 407 is unchanged from 2015. No new nodule. No pulmonary mass or confluent airspace disease. Upper Abdomen: No acute abnormality. Moderate hiatal hernia. Contrast refluxes into the hepatic veins and IVC. Musculoskeletal: There are no acute or suspicious osseous abnormalities. Postsurgical change in the upper lumbar spine. Review of the MIP images confirms the above findings. IMPRESSION: 1. No pulmonary embolus. 2. Advanced  emphysema, which has progressed over the course of 2-1/2 years. There ill-defined patchy and ground-glass opacities in the right greater than left upper lobes superimposed on progressive emphysema, that may be infectious pneumonitis versus pulmonary edema. 3. Thoracic aortic atherosclerosis.  Coronary artery calcifications. Electronically Signed   By: Jeb Levering M.D.   On: 08/17/2016 21:18    ASSESSMENT AND PLAN:   The patient was seen today by Dr Meda Coffee, the patient evaluated and the data reviewed.  Principal Problem:   Acute on chronic combined systolic (congestive) and diastolic (congestive) heart failure ([place]) - she feels better after diuresis, continue this  - BUN as high as 42 previous admit, Cr was 1.03 - follow closely, with diuresis and recent dye  Active Problems:   Persistent atrial fibrillation ([place]) - afib may be contributing to CHF - however, with LVD (mild) and unclear if has CAD, antiarrhythmic options are limited.  - HR generally controlled, despite respiratory problems. - change Toprol XL to bedtime, to hopefully avoid post-med hypotension - CHA2DS2VASc=7 (HTN, CHF, age x 2, CVA x 2, female) - INR sub-therapeutic on admit>>heparin  Otherwise, per IM, consider nebulizers.   COPD (chronic obstructive pulmonary disease) ([facility])   CHF (congestive heart failure) ([facility])   Hemoptysis   Chronic atrial fibrillation ([place])  Signed: Lenoard Aden 08/18/2016 11:38 AM Beeper [id number]  Co-Sign MD  The patient was seen, examined and discussed with Rosaria Ferries, PA-C and I agree with the above.   A pleasant 75 y.o. 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 COPD- followed by Dr Ashok Cordia, (she was recently placed on steroids for a cough), GERD s/p esophageal dilatation May 2017, HTN with EF 45% grade 2 DD by echo 06/2016, HLD, hx bradycardia and PVCs.  S-D-CHF, HTN, HLD. Admitted on 8/23-8/29  for acute on chronic resp failure, acute diagnosed on chronic combined systolic and diastolic CHF (LVEF [id number], grade 2 DD), w/ afib (RVR) s/p DCCV>SR>> Atrial fib. Admitted again on 9/6 - 9/7 after a syncope after Lasix increase for edema, afib RVR and CHF exacerbation. Weight at d/c 141. F/u scheduled for 09/15.   Pt admitted 09/13 yesterday w/ hemoptysis x 1, after dry cough, CXR ddint show significant CHF, however crackles on physical exam, as well as wheezing. She has diuresed 3.6 L overnight after lasix 40 mg iv x1.  Crea 1.1 She states that she had hard time breathing since the discharge,  We will start breathing treatment, continue diuresis, complete nuclear stress test to evaluate for ischemia.  Walking test and home O2 evaluation prior to the discharge.  I would hold metoprolol.   Ena Dawley 08/18/2016

## 2016-08-19 ENCOUNTER — Ambulatory Visit: Payer: Medicare Other | Admitting: Physician Assistant

## 2016-08-19 DIAGNOSIS — J441 Chronic obstructive pulmonary disease with (acute) exacerbation: Secondary | ICD-10-CM

## 2016-08-19 DIAGNOSIS — I480 Paroxysmal atrial fibrillation: Secondary | ICD-10-CM

## 2016-08-19 DIAGNOSIS — I509 Heart failure, unspecified: Secondary | ICD-10-CM

## 2016-08-19 LAB — CBC
HCT: 31.9 % — ABNORMAL LOW (ref 36.0–46.0)
Hemoglobin: 10.2 g/dL — ABNORMAL LOW (ref 12.0–15.0)
MCH: 25.4 pg — ABNORMAL LOW (ref 26.0–34.0)
MCHC: 32 g/dL (ref 30.0–36.0)
MCV: 79.6 fL (ref 78.0–100.0)
Platelets: 222 10*3/uL (ref 150–400)
RBC: 4.01 MIL/uL (ref 3.87–5.11)
RDW: 16.9 % — ABNORMAL HIGH (ref 11.5–15.5)
WBC: 4.3 10*3/uL (ref 4.0–10.5)

## 2016-08-19 LAB — BASIC METABOLIC PANEL
Anion gap: 10 (ref 5–15)
BUN: 17 mg/dL (ref 6–20)
CO2: 28 mmol/L (ref 22–32)
Calcium: 9.1 mg/dL (ref 8.9–10.3)
Chloride: 101 mmol/L (ref 101–111)
Creatinine, Ser: 1.12 mg/dL — ABNORMAL HIGH (ref 0.44–1.00)
GFR calc Af Amer: 53 mL/min — ABNORMAL LOW (ref >60)
GFR calc non Af Amer: 46 mL/min — ABNORMAL LOW (ref >60)
Glucose, Bld: 102 mg/dL — ABNORMAL HIGH (ref 65–99)
Potassium: 4.1 mmol/L (ref 3.5–5.1)
Sodium: 139 mmol/L (ref 135–145)

## 2016-08-19 LAB — HEPARIN LEVEL (UNFRACTIONATED): Heparin Unfractionated: 1.04 IU/mL — ABNORMAL HIGH (ref 0.30–0.70)

## 2016-08-19 LAB — APTT: aPTT: 92 seconds — ABNORMAL HIGH (ref 24–36)

## 2016-08-19 MED ORDER — FUROSEMIDE 20 MG PO TABS
20.0000 mg | ORAL_TABLET | Freq: Every day | ORAL | Status: DC
  Administered 2016-08-19: 20 mg via ORAL
  Filled 2016-08-19: qty 1

## 2016-08-19 MED ORDER — PREDNISONE 20 MG PO TABS
40.0000 mg | ORAL_TABLET | Freq: Every day | ORAL | Status: AC
  Administered 2016-08-19 – 2016-08-21 (×3): 40 mg via ORAL
  Filled 2016-08-19 (×3): qty 2

## 2016-08-19 MED ORDER — METOPROLOL SUCCINATE ER 25 MG PO TB24: 25.0000 mg | ORAL_TABLET | Freq: Every day | ORAL | Status: DC

## 2016-08-19 MED ORDER — APIXABAN 5 MG PO TABS
5.0000 mg | ORAL_TABLET | Freq: Two times a day (BID) | ORAL | Status: DC
  Administered 2016-08-19 – 2016-08-21 (×5): 5 mg via ORAL
  Filled 2016-08-19 (×5): qty 1

## 2016-08-19 NOTE — Consult Note (Signed)
   [facility] CM Inpatient Consult   08/19/2016  CAEDYN TASSINARI [birth year] [id number]   Patient is currently active with [facility] for chronic disease management services.  Patient has been engaged by a [employer].  Active consent on file.  Chart review reveals the patient is a 75 y.o. female with chronic atrial fibrillation, COPD, combined systolic and diastolic HF who was admitted last month for A. fib with RVR and respiratory failure following which patient was admitted again for syncope secondary to overdiuresis and patient's Lasix dose was decreased and Avapro discontinued presents with complaints of increasing shortness of breath last few days on exertion and also has noticed some hemoptysis today. Patient states shortness of breath is mostly on exertion and has been having some productive cough. Patient also noticed some swelling in the lower extremity. Met with the patient and her sister at the bedside.  Patient verbalizes ongoing endorsement of community care management services.  Patient states she will need a follow up with her primary care provider, Dr. Cathlean Cower. Our community based plan of care has focused on disease management and community resource support.  Patient will receive a post discharge transition of care call and will be evaluated for monthly home visits for assessments and disease process education.  Made inpatient RNCM aware of [facility] following prior to admission in progression meeting.  Of note, [facility] Care Management services does not replace or interfere with any services that are needed or arranged by inpatient case management or social work.  For additional questions or referrals please contact:  Natividad Brood, RN BSN [facility] Liaison  [phone] business mobile phone Toll free office [phone]

## 2016-08-19 NOTE — Progress Notes (Signed)
Case Management Note  Patient Details  Name: KALISTA CUGINI MRN: [id number] Date of Birth: 23-Jun-1941  Subjective/Objective:    Admitted with CHF                Action/Plan: Patient lives at home with spouse, PCP is Dr Cathlean Cower; Private insurance with Medicare and the [place]; patient obtains his medication through the mail order pharmacy with [facility]; CM will continue to follow for DCP  Expected Discharge Date:  0918/2017                  Expected Discharge Plan:  Home/Self Care  Discharge planning Services  CM Consult    Status of Service:  In process, will continue to follow  Sherrilyn Rist [id number] 08/19/2016, 1:33 PM

## 2016-08-19 NOTE — Progress Notes (Signed)
PROGRESS NOTE    [name]  [id number] DOB: 1941-05-31 DOA: 08/17/2016 PCP: Cathlean Cower, MD  Brief Narrative: [name] is a 75 y.o. with a history of chronic atrial fibrillation, COPD, combined systolic and diastolic heart failure. He presents with dyspnea and hemoptysis. CTA of her chest ruled out PE with evidence of pneumonitis versus pulmonary congestion and worsening emphysema. Patient's symptoms improved with Lasix IV   Assessment & Plan:   Principal Problem:   Acute on chronic combined systolic (congestive) and diastolic (congestive) heart failure (HCC) Active Problems:   PAF (paroxysmal atrial fibrillation) (HCC)   COPD (chronic obstructive pulmonary disease) (HCC)   CHF (congestive heart failure) (HCC)   Hemoptysis   Chronic atrial fibrillation (HCC)   Acute on chronic respiratory failure with hypoxia (HCC)   Dyspnea, likely secondary to acute on chronic systolic and diastolic heart failure Acute respiratory failure with hypoxia Patient responded well to initial IV diuresis. Symptoms have much improved. She is down 4 pounds since admission (no change since yesterday) and out about 1.2 L -Switched to Lasix 20mg  PO per cardiology recommendations -Continue daily weights -Continue strict ins and outs -Wean O2 to room air  Hemoptysis Likely secondary to coughing. Resolved  COPD Might be in mild exacerbation. Wheezing significant on exam with patient report of decreased exercise tolerance, productive cough with change in sputum amount. -Scheduled DuoNeb every 6 hours and albuterol every 4 hours when necessary -Continue doxycycline -Prednisone started  Acute kidney injury Appears to be stable. Will watch with diuresis -Repeat BMP  Paroxysmal atrial fibrillation Patient surgery slightly elevated in the 100s. She is on Cardizem and metoprolol for rate control. She takes Eliquis or anticoagulation. -Continue Cardizem -Metoprolol held per cardiology  recommendations -Eliquis -Follow-up cardiology recommendations  Anemia Stable  DVT prophylaxis: Switch to Eliquis  Code Status: Full code  Family Communication: Husband at bedside Disposition Plan: Likely discharge home tomorrow   Consultants:   Cardiology   Procedures:  None   Antimicrobials:   Doxycycline (9/13>>   Subjective: Dyspnea improved today. No chest pain.   Objective: Vitals:   08/19/16 1000 08/19/16 1012 08/19/16 1101 08/19/16 1155  BP: (!) 96/57  107/60 115/69  Pulse: 67   99  Resp:    18  Temp:    98.5 F (36.9 C)  TempSrc:    Oral  SpO2:  93%  98%  Weight:      Height:        Intake/Output Summary (Last 24 hours) at 08/19/16 1508 Last data filed at 08/19/16 1400  Gross per 24 hour  Intake          1039.36 ml  Output              775 ml  Net           264.36 ml   Filed Weights   08/17/16 2242 08/18/16 0540 08/19/16 0618  Weight: 64.6 kg (142 lb 6.4 oz) 62.7 kg (138 lb 3.2 oz) 63 kg (138 lb 12.8 oz)    Examination:  General exam: Appears calm and comfortable Respiratory system: Diffuse wheezing with prolonged expiratory phase that is somewhat improved. Respiratory effort normal. Cardiovascular system: S1 & S2 heard, normal rate with irregular rhythm. No murmurs, rubs, gallops or clicks. Gastrointestinal system: Abdomen is nondistended, soft and nontender. No organomegaly or masses felt. Normal bowel sounds heard. Central nervous system: Alert and oriented. No focal neurological deficits. Extremities: No edema. No calf tenderness Skin: No cyanosis. No  rashes Psychiatry: Judgement and insight appear normal. Mood & affect appropriate.     Data Reviewed: I have personally reviewed following labs and imaging studies  CBC:  Recent Labs Lab 08/17/16 1448 08/18/16 0001 08/18/16 0532 08/18/16 1216 08/19/16 0425  WBC 6.0 5.1 4.3 4.6 4.3  HGB 11.0* 10.7* 10.3* 11.0* 10.2*  HCT 34.6* 34.3* 32.6* 34.9* 31.9*  MCV 80.8 80.3 80.5 80.0  79.6  PLT 181 215 199 235 [id number]   Basic Metabolic Panel:  Recent Labs Lab 08/17/16 1448 08/18/16 0001 08/18/16 0532 08/19/16 0425  NA 133*  --  138 139  K 4.5  --  4.0 4.1  CL 103  --  104 101  CO2 19*  --  26 28  GLUCOSE 82  --  98 102*  BUN 17  --  13 17  CREATININE 1.11*  --  1.12* 1.12*  CALCIUM 8.9  --  9.0 9.1  MG  --  2.1  --   --    GFR: Estimated Creatinine Clearance: 36.3 mL/min (by C-G formula based on SCr of 1.12 mg/dL (H)). Liver Function Tests: No results for input(s): AST, ALT, ALKPHOS, BILITOT, PROT, ALBUMIN in the last 168 hours. No results for input(s): LIPASE, AMYLASE in the last 168 hours. No results for input(s): AMMONIA in the last 168 hours. Coagulation Profile:  Recent Labs Lab 08/17/16 1448  INR 1.21   Cardiac Enzymes:  Recent Labs Lab 08/17/16 1926 08/18/16 0001 08/18/16 0532 08/18/16 1216  TROPONINI <0.03 <0.03 <0.03 <0.03   BNP (last 3 results) No results for input(s): PROBNP in the last 8760 hours. HbA1C: No results for input(s): HGBA1C in the last 72 hours. CBG: No results for input(s): GLUCAP in the last 168 hours. Lipid Profile: No results for input(s): CHOL, HDL, LDLCALC, TRIG, CHOLHDL, LDLDIRECT in the last 72 hours. Thyroid Function Tests: No results for input(s): TSH, T4TOTAL, FREET4, T3FREE, THYROIDAB in the last 72 hours. Anemia Panel: No results for input(s): VITAMINB12, FOLATE, FERRITIN, TIBC, IRON, RETICCTPCT in the last 72 hours. Sepsis Labs:  Recent Labs Lab 08/18/16 0908  PROCALCITON <0.10    No results found for this or any previous visit (from the past 240 hour(s)).       Radiology Studies: Dg Chest 2 View  Result Date: 08/17/2016 CLINICAL DATA:  Shortness of breath, chest heaviness and pain with weakness for the past week. Bloody sputum today. History of COPD CVA, CHF. EXAM: CHEST  2 VIEW COMPARISON:  PA and lateral chest x-ray of August 10, 2016 FINDINGS: The lungs remain hyperinflated. The  interstitial markings remain increased overall. There is no alveolar infiltrate or pleural effusion. The heart and pulmonary vascularity are normal. There is a hiatal hernia. There is calcification in the wall of the aortic arch. There is mild multilevel degenerative disc disease of the thoracic spine. IMPRESSION: COPD with chronic pulmonary fibrosis. No alveolar pneumonia nor CHF. Aortic atherosclerosis. Electronically Signed   By: David  Martinique M.D.   On: 08/17/2016 15:55   Ct Angio Chest Pe W And/or Wo Contrast  Result Date: 08/17/2016 CLINICAL DATA:  Hemoptysis. Increased shortness of breath with exertion. Cough for 1 week. EXAM: CT ANGIOGRAPHY CHEST WITH CONTRAST TECHNIQUE: Multidetector CT imaging of the chest was performed using the standard protocol during bolus administration of intravenous contrast. Multiplanar CT image reconstructions and MIPs were obtained to evaluate the vascular anatomy. CONTRAST:  100 cc Isovue 370 IV COMPARISON:  Radiographs earlier this day.  Chest CT 12/19/2013 FINDINGS: Cardiovascular: There  are no filling defects within the pulmonary arteries to suggest pulmonary embolus. The thoracic aorta is normal in caliber without dissection. There is moderate atherosclerosis without aneurysm. There is a conventional branching pattern from the aortic arch. Coronary artery calcifications are seen. Mediastinum/Nodes: Minimal ill-defined hilar soft tissue density likely lymphoid tissue, and discrete no wounds not seen. No mediastinal adenopathy. No pericardial effusion. Lungs/Pleura: Advanced emphysema, this is progressed from chest CT 2 and half years ago. Ill-defined ground-glass opacities throughout the right upper lobe superimposed on advanced emphysema, with minimal smooth septal thickening. Similar findings are seen in the left upper lobe and lingula to a lesser extent. There is central bronchial wall thickening. A 7 mm subpleural nodule in the right lower lobe image 110 series 407 is  unchanged from 2015. No new nodule. No pulmonary mass or confluent airspace disease. Upper Abdomen: No acute abnormality. Moderate hiatal hernia. Contrast refluxes into the hepatic veins and IVC. Musculoskeletal: There are no acute or suspicious osseous abnormalities. Postsurgical change in the upper lumbar spine. Review of the MIP images confirms the above findings. IMPRESSION: 1. No pulmonary embolus. 2. Advanced emphysema, which has progressed over the course of 2-1/2 years. There ill-defined patchy and ground-glass opacities in the right greater than left upper lobes superimposed on progressive emphysema, that may be infectious pneumonitis versus pulmonary edema. 3. Thoracic aortic atherosclerosis.  Coronary artery calcifications. Electronically Signed   By: Jeb Levering M.D.   On: 08/17/2016 21:18        Scheduled Meds: . apixaban  5 mg Oral BID  . azelastine  2 spray Each Nare Daily  . citalopram  20 mg Oral Daily  . diltiazem  300 mg Oral Daily  . doxycycline (VIBRAMYCIN) IV  100 mg Intravenous Q12H  . ezetimibe  10 mg Oral Daily  . fluticasone  1 spray Each Nare BID  . furosemide  20 mg Oral Daily  . Melatonin  3 mg Oral QHS  . mirabegron ER  50 mg Oral Daily  . mometasone-formoterol  2 puff Inhalation BID  . montelukast  10 mg Oral Daily  . multivitamin with minerals  1 tablet Oral Daily  . pantoprazole  40 mg Oral Daily  . predniSONE  40 mg Oral Q breakfast  . tiotropium  18 mcg Inhalation Daily  . vitamin E  400 Units Oral Daily   Continuous Infusions:     LOS: 1 day     Cordelia Poche [facility] 08/19/2016, 3:08 PM Pager: [phone]  If 7PM-7AM, please contact night-coverage [email or web address] Password [email or web address] 08/19/2016, 3:08 PM

## 2016-08-19 NOTE — Progress Notes (Signed)
SATURATION QUALIFICATIONS: (This note is used to comply with regulatory documentation for home oxygen)  Patient Saturations on Room Air at Rest = 92-93%  Patient Saturations on Room Air while Ambulating = 88-91%  Patient Saturations on 2 Liters of oxygen while Ambulating = 94-96%  Please briefly explain why patient needs home oxygen: Pt's saturations dropped to 88% while ambulating on room air. Pt reported feeling short of breath while ambulating on room air. Pt's saturations were 94-96% while ambulating with 2 liters of oxygen.

## 2016-08-19 NOTE — Progress Notes (Signed)
Patient Name: [name] Date of Encounter: 08/19/2016  Principal Problem:   Acute on chronic combined systolic (congestive) and diastolic (congestive) heart failure (HCC) Active Problems:   PAF (paroxysmal atrial fibrillation) (HCC)   COPD (chronic obstructive pulmonary disease) (HCC)   CHF (congestive heart failure) (HCC)   Hemoptysis   Chronic atrial fibrillation (HCC)   Acute on chronic respiratory failure with hypoxia ([facility])   Length of Stay: 1  SUBJECTIVE  She feels significant SOB, some improvement since the admission but not at baseline yet.  CURRENT MEDS . albuterol  2.5 mg Nebulization TID  . azelastine  2 spray Each Nare Daily  . citalopram  20 mg Oral Daily  . diltiazem  300 mg Oral Daily  . doxycycline (VIBRAMYCIN) IV  100 mg Intravenous Q12H  . ezetimibe  10 mg Oral Daily  . fluticasone  1 spray Each Nare BID  . furosemide  40 mg Intravenous Daily  . Melatonin  3 mg Oral QHS  . mirabegron ER  50 mg Oral Daily  . mometasone-formoterol  2 puff Inhalation BID  . montelukast  10 mg Oral Daily  . multivitamin with minerals  1 tablet Oral Daily  . pantoprazole  40 mg Oral Daily  . tiotropium  18 mcg Inhalation Daily  . vitamin E  400 Units Oral Daily   OBJECTIVE  Vitals:   08/19/16 0133 08/19/16 0618 08/19/16 1000 08/19/16 1012  BP: (!) 98/54 100/64 (!) 96/57   Pulse: 86 85 67   Resp:  18    Temp: 98.9 F (37.2 C) 98.2 F (36.8 C)    TempSrc: Oral Oral    SpO2: 96% 95%  93%  Weight:  138 lb 12.8 oz (63 kg)    Height:        Intake/Output Summary (Last 24 hours) at 08/19/16 1044 Last data filed at 08/19/16 [id number]  Gross per 24 hour  Intake           919.36 ml  Output             1575 ml  Net          -655.64 ml   Filed Weights   08/17/16 2242 08/18/16 0540 08/19/16 0618  Weight: 142 lb 6.4 oz (64.6 kg) 138 lb 3.2 oz (62.7 kg) 138 lb 12.8 oz (63 kg)   PHYSICAL EXAM  General: Pleasant, NAD. Neuro: Alert and oriented X 3. Moves all  extremities spontaneously. Psych: Normal affect. HEENT:  Normal  Neck: Supple without bruits or JVD. Lungs:  Resp regular and unlabored, wheezing B/L Heart: RRR no s3, s4, or murmurs. Abdomen: Soft, non-tender, non-distended, BS + x 4.  Extremities: No clubbing, cyanosis or edema. DP/PT/Radials 2+ and equal bilaterally.  Accessory Clinical Findings  CBC  Recent Labs  08/18/16 1216 08/19/16 0425  WBC 4.6 4.3  HGB 11.0* 10.2*  HCT 34.9* 31.9*  MCV 80.0 79.6  PLT 235 [id number]   Basic Metabolic Panel  Recent Labs  08/18/16 0001 08/18/16 0532 08/19/16 0425  NA  --  138 139  K  --  4.0 4.1  CL  --  104 101  CO2  --  26 28  GLUCOSE  --  98 102*  BUN  --  13 17  CREATININE  --  1.12* 1.12*  CALCIUM  --  9.0 9.1  MG 2.1  --   --     Recent Labs  08/18/16 0001 08/18/16 0532 08/18/16 1216  TROPONINI <  0.03 <0.03 <0.03   Dg Chest 2 View  Result Date: 08/17/2016 CLINICAL DATA:  Shortness of breath, chest heaviness and pain with weakness for the past week. Bloody sputum today. History of COPD CVA, CHF. EXAM: CHEST  2 VIEW COMPARISON:  PA and lateral chest x-ray of August 10, 2016 FINDINGS: The lungs remain hyperinflated. The interstitial markings remain increased overall. There is no alveolar infiltrate or pleural effusion. The heart and pulmonary vascularity are normal. There is a hiatal hernia. There is calcification in the wall of the aortic arch. There is mild multilevel degenerative disc disease of the thoracic spine. IMPRESSION: COPD with chronic pulmonary fibrosis. No alveolar pneumonia nor CHF. Aortic atherosclerosis. Electronically Signed   By: David  Martinique M.D.   On: 08/17/2016 15:55   Ct Angio Chest Pe W And/or Wo Contrast  Result Date: 08/17/2016 CLINICAL DATA:  Hemoptysis. Increased shortness of breath with exertion. Cough for 1 week. . IMPRESSION: 1. No pulmonary embolus. 2. Advanced emphysema, which has progressed over the course of 2-1/2 years. There ill-defined  patchy and ground-glass opacities in the right greater than left upper lobes superimposed on progressive emphysema, that may be infectious pneumonitis versus pulmonary edema. 3. Thoracic aortic atherosclerosis.  Coronary artery calcifications. Electronically Signed   By: Jeb Levering M.D.   On: 08/17/2016 21:18     ASSESSMENT AND PLAN   Acute on chronic combined systolic (congestive) and diastolic (congestive) heart failure ([place]) - she feels better after diuresis, continue this  - BUN as high as 42 previous admit, Cr was 1.03 - follow closely, with diuresis and recent dye  Active Problems:   Persistent atrial fibrillation ([place]) - afib may be contributing to CHF - however, with LVD (mild) and unclear if has CAD, antiarrhythmic options are limited.  - HR generally controlled, despite respiratory problems. - change Toprol XL to bedtime, to hopefully avoid post-med hypotension - CHA2DS2VASc=7 (HTN, CHF, age x 2, CVA x 2, female) - INR sub-therapeutic on admit>>heparin    Otherwise, per IM, consider nebulizers.   COPD (chronic obstructive pulmonary disease) (HCC)   CHF (congestive heart failure) (HCC)   Hemoptysis   Chronic atrial fibrillation [facility])  A pleasant 75 y.o. 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 COPD- followed by Dr Ashok Cordia, (she was recently placed on steroids for a cough), GERD s/p esophageal dilatation May 2017, HTN with EF 45% grade 2 DD by echo 06/2016, HLD, hx bradycardia and PVCs. S-D-CHF, HTN, HLD. Admitted on 8/23-8/29 for acute on chronic resp failure, acute diagnosed on chronic combined systolic and diastolic CHF (LVEF [id number], grade 2 DD), w/ afib (RVR) s/p DCCV>SR>> Atrial fib. Admitted again on 9/6 - 9/7 after a syncope after Lasix increase for edema, afib RVR and CHF exacerbation. Weight at d/c 141. F/u scheduled for 09/15.   Pt admitted 09/13 yesterday w/ hemoptysis x 1, after  dry cough, CXR didnt show significant CHF, however crackles on physical exam, as well as wheezing. She has diuresed 4.2 L overnight, no significant residual fluid overload, I will switch to lasix 20 mg po daily.  She has significant B/L wheezing, I will start prednisone 40 mg po x 3 days.  Awaiting results from nuclear stress test to evaluate for ischemia.  Walking test and home O2 evaluation prior to the discharge.   Signed, Ena Dawley MD, [facility] 08/19/2016

## 2016-08-19 NOTE — Progress Notes (Signed)
Pt refused bed alarm om. Will continue hourly rounding.

## 2016-08-19 NOTE — Care Management Note (Deleted)
Case Management Note  Patient Details  Name: [name] MRN: [id number] Date of Birth: 1941-09-05  Subjective/Objective:    Admitted with CHF                Action/Plan: Patient lives at home with spouse, PCP is Dr Cathlean Cower; Private insurance with Medicare and the [employer]; patient obtains his medication through the mail order pharmacy with [facility]; CM will continue to follow for DCP  Expected Discharge Date:  0918/2017              Expected Discharge Plan:  Home/Self Care  Discharge planning Services  CM Consult    Status of Service:  In process, will continue to follow  Sherrilyn Rist [id number] 08/19/2016, 1:33 PM

## 2016-08-20 DIAGNOSIS — I255 Ischemic cardiomyopathy: Secondary | ICD-10-CM

## 2016-08-20 LAB — BASIC METABOLIC PANEL
Anion gap: 10 (ref 5–15)
BUN: 24 mg/dL — ABNORMAL HIGH (ref 6–20)
CO2: 27 mmol/L (ref 22–32)
Calcium: 9.7 mg/dL (ref 8.9–10.3)
Chloride: 102 mmol/L (ref 101–111)
Creatinine, Ser: 1.04 mg/dL — ABNORMAL HIGH (ref 0.44–1.00)
GFR calc Af Amer: 59 mL/min — ABNORMAL LOW (ref >60)
GFR calc non Af Amer: 50 mL/min — ABNORMAL LOW (ref >60)
Glucose, Bld: 114 mg/dL — ABNORMAL HIGH (ref 65–99)
Potassium: 4.8 mmol/L (ref 3.5–5.1)
Sodium: 139 mmol/L (ref 135–145)

## 2016-08-20 LAB — PROCALCITONIN: Procalcitonin: <0.1 ng/mL

## 2016-08-20 MED ORDER — FUROSEMIDE 10 MG/ML IJ SOLN
20.0000 mg | Freq: Two times a day (BID) | INTRAMUSCULAR | Status: DC
  Administered 2016-08-20: 20 mg via INTRAVENOUS
  Filled 2016-08-20: qty 2

## 2016-08-20 MED ORDER — FUROSEMIDE 20 MG PO TABS
20.0000 mg | ORAL_TABLET | Freq: Every day | ORAL | Status: DC
  Administered 2016-08-21: 20 mg via ORAL
  Filled 2016-08-20: qty 1

## 2016-08-20 NOTE — Progress Notes (Signed)
[facility]  PROGRESS NOTE  [name] [id number] DOB: [birth year] DOA: 08/17/2016 PCP: Cathlean Cower, MD   Brief HPI:  75 y.o. with a history of chronic atrial fibrillation, COPD, combined systolic and diastolic heart failure. He presents with dyspnea and hemoptysis. CTA of her chest ruled out PE with evidence of pneumonitis versus pulmonary congestion and worsening emphysema. Patient's symptoms improved with Lasix IV    Subjective    This morning patient continues to have shortness of breath. Still requiring oxygen.    Assessment/Plan:     1. Acute on chronic diastolic and systolic heart failure- patient started on IV Lasix, weight is down 4 pounds since admission. Net -3.8 L. Cardiology has changed IV Lasix to by mouth. Plan for cardiac cath on Monday. 2. COPD exacerbation- mild, started on prednisone 40 mg by mouth daily, taper over the next few days 3. Hemoptysis-resolved, likely secondary to pulmonary edema.  4. Acute kidney injury- patient's creatinine improved after diuresis, today creatinine 1.04 5. Paroxysmal atrial fibrillation- continue Cardizem, metoprolol. Patient is on Eliquis  for anticoagulation.      DVT prophylaxis: Patient currently on Apixaban  Code Status: Full code  Family Communication: No family at bedside   Disposition Plan: Pending improvement in respiratory status   Consultants:  Cardiology   Procedures:  None  Antibiotics:   Anti-infectives    Start     Dose/Rate Route Frequency Ordered Stop   08/18/16 1800  doxycycline (VIBRAMYCIN) 100 mg in dextrose 5 % 250 mL IVPB     100 mg 125 mL/hr over 120 Minutes Intravenous Every 12 hours 08/18/16 0600     08/18/16 0615  doxycycline (VIBRAMYCIN) 100 mg in dextrose 5 % 250 mL IVPB     100 mg 125 mL/hr over 120 Minutes Intravenous  Once 08/18/16 0602 08/18/16 1133       Objective   Vitals:   08/20/16 0514 08/20/16 0743 08/20/16 0948 08/20/16 1235  BP: 126/81  116/77 101/80   Pulse: 90   (!) 44  Resp:    20  Temp: 98.4 F (36.9 C)  98.5 F (36.9 C) 98.1 F (36.7 C)  TempSrc: Oral  Oral Oral  SpO2: 92% 95% 97% 98%  Weight: 62.8 kg (138 lb 6.4 oz)     Height:        Intake/Output Summary (Last 24 hours) at 08/20/16 1611 Last data filed at 08/20/16 1300  Gross per 24 hour  Intake             1210 ml  Output             2000 ml  Net             -790 ml   Filed Weights   08/18/16 0540 08/19/16 0618 08/20/16 0514  Weight: 62.7 kg (138 lb 3.2 oz) 63 kg (138 lb 12.8 oz) 62.8 kg (138 lb 6.4 oz)     Physical Examination:  General exam: Appears calm and comfortable. Respiratory system: Bibasilar crackles right more than left Respiratory effort normal. Cardiovascular system:  RRR. No  murmurs, rubs, gallops. No pedal edema. GI system: Abdomen is nondistended, soft and nontender. No organomegaly.  Psychiatry: Alert, oriented x 3.Judgement and insight appear normal. Affect normal.    Data Reviewed: I have personally reviewed following labs and imaging studies  CBG: No results for input(s): GLUCAP in the last 168 hours.  CBC:  Recent Labs Lab 08/17/16 1448 08/18/16 0001 08/18/16 0532 08/18/16 1216 08/19/16 0425  WBC 6.0 5.1 4.3 4.6 4.3  HGB 11.0* 10.7* 10.3* 11.0* 10.2*  HCT 34.6* 34.3* 32.6* 34.9* 31.9*  MCV 80.8 80.3 80.5 80.0 79.6  PLT 181 215 199 235 [id number]    Basic Metabolic Panel:  Recent Labs Lab 08/17/16 1448 08/18/16 0001 08/18/16 0532 08/19/16 0425 08/20/16 0502  NA 133*  --  138 139 139  K 4.5  --  4.0 4.1 4.8  CL 103  --  104 101 102  CO2 19*  --  26 28 27   GLUCOSE 82  --  98 102* 114*  BUN 17  --  13 17 24*  CREATININE 1.11*  --  1.12* 1.12* 1.04*  CALCIUM 8.9  --  9.0 9.1 9.7  MG  --  2.1  --   --   --     No results found for this or any previous visit (from the past 240 hour(s)).   Liver Function Tests: No results for input(s): AST, ALT, ALKPHOS, BILITOT, PROT, ALBUMIN in the last 168 hours. No results for  input(s): LIPASE, AMYLASE in the last 168 hours. No results for input(s): AMMONIA in the last 168 hours.  Cardiac Enzymes:  Recent Labs Lab 08/17/16 1926 08/18/16 0001 08/18/16 0532 08/18/16 1216  TROPONINI <0.03 <0.03 <0.03 <0.03   BNP (last 3 results)  Recent Labs  07/27/16 0952 08/10/16 1336 08/17/16 2151  BNP 723.5* 447.4* 343.5*    ProBNP (last 3 results) No results for input(s): PROBNP in the last 8760 hours.    Studies: No results found.  Scheduled Meds: . apixaban  5 mg Oral BID  . azelastine  2 spray Each Nare Daily  . citalopram  20 mg Oral Daily  . diltiazem  300 mg Oral Daily  . doxycycline (VIBRAMYCIN) IV  100 mg Intravenous Q12H  . ezetimibe  10 mg Oral Daily  . fluticasone  1 spray Each Nare BID  . furosemide  20 mg Intravenous Q12H  . Melatonin  3 mg Oral QHS  . mirabegron ER  50 mg Oral Daily  . mometasone-formoterol  2 puff Inhalation BID  . montelukast  10 mg Oral Daily  . multivitamin with minerals  1 tablet Oral Daily  . pantoprazole  40 mg Oral Daily  . predniSONE  40 mg Oral Q breakfast  . tiotropium  18 mcg Inhalation Daily  . vitamin E  400 Units Oral Daily    Continuous Infusions:   Time spent: 25 min [place] [facility] Pager [phone]. If 7PM-7AM, please contact night-coverage at [email or web address], Office  [phone]  password [email or web address] 08/20/2016, 4:11 PM  LOS: 2 days

## 2016-08-20 NOTE — Progress Notes (Addendum)
Patient Name: [name] Date of Encounter: 08/20/2016  Principal Problem:   Acute on chronic combined systolic (congestive) and diastolic (congestive) heart failure (HCC) Active Problems:   PAF (paroxysmal atrial fibrillation) (HCC)   COPD (chronic obstructive pulmonary disease) (HCC)   CHF (congestive heart failure) (HCC)   Hemoptysis   Chronic atrial fibrillation (HCC)   Acute on chronic respiratory failure with hypoxia ([place])   Length of Stay: 2  SUBJECTIVE  She feels some improvement in SOB, but still persistent. She continues to cough and wheeze, asking for more breathing treatments.  CURRENT MEDS . apixaban  5 mg Oral BID  . azelastine  2 spray Each Nare Daily  . citalopram  20 mg Oral Daily  . diltiazem  300 mg Oral Daily  . doxycycline (VIBRAMYCIN) IV  100 mg Intravenous Q12H  . ezetimibe  10 mg Oral Daily  . fluticasone  1 spray Each Nare BID  . furosemide  20 mg Intravenous Q12H  . Melatonin  3 mg Oral QHS  . mirabegron ER  50 mg Oral Daily  . mometasone-formoterol  2 puff Inhalation BID  . montelukast  10 mg Oral Daily  . multivitamin with minerals  1 tablet Oral Daily  . pantoprazole  40 mg Oral Daily  . predniSONE  40 mg Oral Q breakfast  . tiotropium  18 mcg Inhalation Daily  . vitamin E  400 Units Oral Daily   OBJECTIVE  Vitals:   08/19/16 2054 08/20/16 0514 08/20/16 0743 08/20/16 0948  BP: (!) 111/58 126/81  116/77  Pulse: 92 90    Resp: 18     Temp: 97.9 F (36.6 C) 98.4 F (36.9 C)  98.5 F (36.9 C)  TempSrc: Oral Oral  Oral  SpO2: 95% 92% 95% 97%  Weight:  138 lb 6.4 oz (62.8 kg)    Height:        Intake/Output Summary (Last 24 hours) at 08/20/16 1009 Last data filed at 08/20/16 0603  Gross per 24 hour  Intake              850 ml  Output              800 ml  Net               50 ml   Filed Weights   08/18/16 0540 08/19/16 0618 08/20/16 0514  Weight: 138 lb 3.2 oz (62.7 kg) 138 lb 12.8 oz (63 kg) 138 lb 6.4 oz (62.8 kg)    PHYSICAL EXAM  General: Pleasant, NAD. Neuro: Alert and oriented X 3. Moves all extremities spontaneously. Psych: Normal affect. HEENT:  Normal  Neck: Supple without bruits or JVD. Lungs:  Resp regular and unlabored, mild crackles B/L Heart: RRR no s3, s4, or murmurs. Abdomen: Soft, non-tender, non-distended, BS + x 4.  Extremities: No clubbing, cyanosis or edema. DP/PT/Radials 2+ and equal bilaterally.  Accessory Clinical Findings  CBC  Recent Labs  08/18/16 1216 08/19/16 0425  WBC 4.6 4.3  HGB 11.0* 10.2*  HCT 34.9* 31.9*  MCV 80.0 79.6  PLT 235 [id number]   Basic Metabolic Panel  Recent Labs  08/18/16 0001  08/19/16 0425 08/20/16 0502  NA  --   < > 139 139  K  --   < > 4.1 4.8  CL  --   < > 101 102  CO2  --   < > 28 27  GLUCOSE  --   < > 102* 114*  BUN  --   < > 17 24*  CREATININE  --   < > 1.12* 1.04*  CALCIUM  --   < > 9.1 9.7  MG 2.1  --   --   --   < > = values in this interval not displayed.  Recent Labs  08/18/16 0001 08/18/16 0532 08/18/16 1216  TROPONINI <0.03 <0.03 <0.03   Dg Chest 2 View  Result Date: 08/17/2016 CLINICAL DATA:  Shortness of breath, chest heaviness and pain with weakness for the past week. Bloody sputum today. History of COPD CVA, CHF. EXAM: CHEST  2 VIEW COMPARISON:  PA and lateral chest x-ray of August 10, 2016 FINDINGS: The lungs remain hyperinflated. The interstitial markings remain increased overall. There is no alveolar infiltrate or pleural effusion. The heart and pulmonary vascularity are normal. There is a hiatal hernia. There is calcification in the wall of the aortic arch. There is mild multilevel degenerative disc disease of the thoracic spine. IMPRESSION: COPD with chronic pulmonary fibrosis. No alveolar pneumonia nor CHF. Aortic atherosclerosis. Electronically Signed   By: David  Martinique M.D.   On: 08/17/2016 15:55   Ct Angio Chest Pe W And/or Wo Contrast  Result Date: 08/17/2016 CLINICAL DATA:  Hemoptysis. Increased  shortness of breath with exertion. Cough for 1 week. . IMPRESSION: 1. No pulmonary embolus. 2. Advanced emphysema, which has progressed over the course of 2-1/2 years. There ill-defined patchy and ground-glass opacities in the right greater than left upper lobes superimposed on progressive emphysema, that may be infectious pneumonitis versus pulmonary edema. 3. Thoracic aortic atherosclerosis.  Coronary artery calcifications. Electronically Signed   By: Jeb Levering M.D.   On: 08/17/2016 21:18     ASSESSMENT AND PLAN   Acute on chronic combined systolic (congestive) and diastolic (congestive) heart failure ([place]) - she feels better after diuresis, continue this  - BUN as high as 42 previous admit, Cr was 1.03 - follow closely, with diuresis and recent dye  Active Problems:   Persistent atrial fibrillation ([place]) - afib may be contributing to CHF - however, with LVD (mild) and unclear if has CAD, antiarrhythmic options are limited.  - HR generally controlled, despite respiratory problems. - change Toprol XL to bedtime, to hopefully avoid post-med hypotension - CHA2DS2VASc=7 (HTN, CHF, age x 2, CVA x 2, female) - INR sub-therapeutic on admit>>heparin    Otherwise, per IM, consider nebulizers.   COPD (chronic obstructive pulmonary disease) (HCC)   CHF (congestive heart failure) (HCC)   Hemoptysis   Chronic atrial fibrillation [facility])  A pleasant 75 y.o. 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 COPD- followed by Dr Ashok Cordia, (she was recently placed on steroids for a cough), GERD s/p esophageal dilatation May 2017, HTN with EF 45% grade 2 DD by echo 06/2016, HLD, hx bradycardia and PVCs. S-D-CHF, HTN, HLD. Admitted on 8/23-8/29 for acute on chronic resp failure, acute diagnosed on chronic combined systolic and diastolic CHF (LVEF [id number], grade 2 DD), w/ afib (RVR) s/p DCCV>SR>> Atrial fib. Admitted again on 9/6 -  9/7 after a syncope after Lasix increase for edema, afib RVR and CHF exacerbation. Weight at d/c 141. F/u scheduled for 09/15.   Pt admitted 09/13 yesterday w/ hemoptysis x 1, after dry cough, now completely resolved, CXR didnt show significant CHF, however crackles on physical exam, as well as ongoing wheezing. She has diuresed 4.2 L overnight, there is minimal  residual  fluid overload, I will switch to PO lasix 20 mg BID.  She has significant B/L wheezing, I started prednisone 40 mg po x 3 days, this is the third day and she is still wheezing. I will increase albuterol/atrovent. Walking test and home O2 evaluation prior to the discharge.   The patient is progressing slowly. This is the second episode of CHF in a short period of time, she has had exertional CP, DOE, she has decreased LVEF, she is not a good candidate for Lexiscan as she is actively wheezing, unable to walk. There is significant calcium on chest CT and coronary CTA would be suboptimal. I would schedule a cardiac catheterization for Monday.   Signed, Ena Dawley MD, [facility] 08/20/2016

## 2016-08-21 LAB — CBC
HCT: 31.4 % — ABNORMAL LOW (ref 36.0–46.0)
Hemoglobin: 9.8 g/dL — ABNORMAL LOW (ref 12.0–15.0)
MCH: 25.2 pg — ABNORMAL LOW (ref 26.0–34.0)
MCHC: 31.2 g/dL (ref 30.0–36.0)
MCV: 80.7 fL (ref 78.0–100.0)
Platelets: 292 10*3/uL (ref 150–400)
RBC: 3.89 MIL/uL (ref 3.87–5.11)
RDW: 16.7 % — ABNORMAL HIGH (ref 11.5–15.5)
WBC: 4.9 10*3/uL (ref 4.0–10.5)

## 2016-08-21 LAB — COMPREHENSIVE METABOLIC PANEL
ALT: 20 U/L (ref 14–54)
AST: 17 U/L (ref 15–41)
Albumin: 2.9 g/dL — ABNORMAL LOW (ref 3.5–5.0)
Alkaline Phosphatase: 58 U/L (ref 38–126)
Anion gap: 10 (ref 5–15)
BUN: 26 mg/dL — ABNORMAL HIGH (ref 6–20)
CO2: 28 mmol/L (ref 22–32)
Calcium: 9.9 mg/dL (ref 8.9–10.3)
Chloride: 101 mmol/L (ref 101–111)
Creatinine, Ser: 1.18 mg/dL — ABNORMAL HIGH (ref 0.44–1.00)
GFR calc Af Amer: 50 mL/min — ABNORMAL LOW (ref >60)
GFR calc non Af Amer: 43 mL/min — ABNORMAL LOW (ref >60)
Glucose, Bld: 129 mg/dL — ABNORMAL HIGH (ref 65–99)
Potassium: 4.2 mmol/L (ref 3.5–5.1)
Sodium: 139 mmol/L (ref 135–145)
Total Bilirubin: 0.2 mg/dL — ABNORMAL LOW (ref 0.3–1.2)
Total Protein: 5.6 g/dL — ABNORMAL LOW (ref 6.5–8.1)

## 2016-08-21 MED ORDER — SODIUM CHLORIDE 0.9 % WEIGHT BASED INFUSION: 1.0000 mL/kg/h | INTRAVENOUS | Status: DC

## 2016-08-21 MED ORDER — SODIUM CHLORIDE 0.9 % IV SOLN: 250.0000 mL | INTRAVENOUS | Status: DC | PRN

## 2016-08-21 MED ORDER — FUROSEMIDE 20 MG PO TABS
20.0000 mg | ORAL_TABLET | Freq: Every day | ORAL | Status: DC
  Administered 2016-08-22 – 2016-08-23 (×2): 20 mg via ORAL
  Filled 2016-08-21 (×2): qty 1

## 2016-08-21 MED ORDER — SODIUM CHLORIDE 0.9 % WEIGHT BASED INFUSION: 3.0000 mL/kg/h | INTRAVENOUS | Status: DC

## 2016-08-21 MED ORDER — PREDNISONE 10 MG PO TABS
30.0000 mg | ORAL_TABLET | Freq: Every day | ORAL | Status: DC
  Administered 2016-08-22 – 2016-08-23 (×2): 30 mg via ORAL
  Filled 2016-08-21 (×2): qty 1

## 2016-08-21 MED ORDER — ASPIRIN 81 MG PO CHEW
81.0000 mg | CHEWABLE_TABLET | ORAL | Status: AC
  Administered 2016-08-22: 81 mg via ORAL
  Filled 2016-08-21: qty 1

## 2016-08-21 MED ORDER — SODIUM CHLORIDE 0.9% FLUSH: 3.0000 mL | INTRAVENOUS | Status: DC | PRN

## 2016-08-21 MED ORDER — GUAIFENESIN-DM 100-10 MG/5ML PO SYRP
5.0000 mL | ORAL_SOLUTION | ORAL | Status: DC | PRN
  Administered 2016-08-22: 5 mL via ORAL
  Filled 2016-08-21: qty 5

## 2016-08-21 MED ORDER — SODIUM CHLORIDE 0.9% FLUSH: 3.0000 mL | Freq: Two times a day (BID) | INTRAVENOUS | Status: DC

## 2016-08-21 MED ORDER — BENZONATATE 100 MG PO CAPS: 200.0000 mg | ORAL_CAPSULE | Freq: Three times a day (TID) | ORAL | Status: DC | PRN

## 2016-08-21 MED ORDER — IPRATROPIUM-ALBUTEROL 0.5-2.5 (3) MG/3ML IN SOLN
3.0000 mL | Freq: Four times a day (QID) | RESPIRATORY_TRACT | Status: DC
  Administered 2016-08-21 – 2016-08-22 (×4): 3 mL via RESPIRATORY_TRACT
  Filled 2016-08-21 (×4): qty 3

## 2016-08-21 MED ORDER — HEPARIN (PORCINE) IN NACL 100-0.45 UNIT/ML-% IJ SOLN
1150.0000 [IU]/h | INTRAMUSCULAR | Status: DC
  Administered 2016-08-21: 1100 [IU]/h via INTRAVENOUS
  Filled 2016-08-21: qty 250

## 2016-08-21 MED ORDER — HEPARIN (PORCINE) IN NACL 100-0.45 UNIT/ML-% IJ SOLN: 1100.0000 [IU]/h | INTRAMUSCULAR | Status: DC

## 2016-08-21 NOTE — Progress Notes (Signed)
[facility]  PROGRESS NOTE  [name] [id number] DOB: [birth year] DOA: 08/17/2016 PCP: Cathlean Cower, MD   Brief HPI:  75 y.o. with a history of chronic atrial fibrillation, COPD, combined systolic and diastolic heart failure. He presents with dyspnea and hemoptysis. CTA of her chest ruled out PE with evidence of pneumonitis versus pulmonary congestion and worsening emphysema.she was started on steroids and IV lasix with some improvement. Cardiology consulted and plan for cardiac cath in am.     Subjective    Off oxygen, but persistent mild wheezing bilateral.     Assessment/Plan:     1. Acute on chronic diastolic and systolic heart failure- patient started on IV Lasix, changed to po lasix 20 mg daily by cardiology. Net -3.8 L.  Plan for cardiac cath on Monday. 2. MIld acute COPD exacerbation- mild, started on prednisone 40 mg by mouth daily, taper over the next one week. Resume doxycycline. Tessalon and robitussin added.  3. Hemoptysis-resolved, likely secondary to pulmonary edema. None today.  4. Acute kidney injury- baseline around 1. Currently 1.1 with diuresis. Monitor.  5. Paroxysmal atrial fibrillation- continue Cardizem, metoprolol. eliquis stopped and IV heparin to be started tonight.       DVT prophylaxis: iv HEPARIN  Code Status: Full code  Family Communication: No family at bedside   Disposition Plan: Pending improvement in respiratory status/ cardiac cath in am.    Consultants:  Cardiology   Procedures:  None  Antibiotics:   Anti-infectives    Start     Dose/Rate Route Frequency Ordered Stop   08/18/16 1800  doxycycline (VIBRAMYCIN) 100 mg in dextrose 5 % 250 mL IVPB     100 mg 125 mL/hr over 120 Minutes Intravenous Every 12 hours 08/18/16 0600     08/18/16 0615  doxycycline (VIBRAMYCIN) 100 mg in dextrose 5 % 250 mL IVPB     100 mg 125 mL/hr over 120 Minutes Intravenous  Once 08/18/16 0602 08/18/16 1133       Objective   Vitals:    08/20/16 2011 08/20/16 2031 08/21/16 0607 08/21/16 0933  BP:  (!) 112/54 118/84 136/68  Pulse:  86 94   Resp:  20 20   Temp:  98.4 F (36.9 C) 98.1 F (36.7 C)   TempSrc:  Oral Oral   SpO2: 97% 96% 94%   Weight:   62.5 kg (137 lb 11.2 oz)   Height:        Intake/Output Summary (Last 24 hours) at 08/21/16 1256 Last data filed at 08/21/16 0645  Gross per 24 hour  Intake              980 ml  Output             1750 ml  Net             -770 ml   Filed Weights   08/19/16 0618 08/20/16 0514 08/21/16 0607  Weight: 63 kg (138 lb 12.8 oz) 62.8 kg (138 lb 6.4 oz) 62.5 kg (137 lb 11.2 oz)     Physical Examination:  General exam: Appears calm and comfortable. Respiratory system: bilateral exp wheezing posteriorly. [place] air entry.  Cardiovascular system:  RRR. No  murmurs, rubs, gallops. No pedal edema. GI system: Abdomen is nondistended, soft and nontender. No organomegaly.  Psychiatry: Alert, oriented x 3.Judgement and insight appear normal. Affect normal.    Data Reviewed: I have personally reviewed following labs and imaging studies  CBG: No results for input(s): GLUCAP in  the last 168 hours.  CBC:  Recent Labs Lab 08/18/16 0001 08/18/16 0532 08/18/16 1216 08/19/16 0425 08/21/16 0224  WBC 5.1 4.3 4.6 4.3 4.9  HGB 10.7* 10.3* 11.0* 10.2* 9.8*  HCT 34.3* 32.6* 34.9* 31.9* 31.4*  MCV 80.3 80.5 80.0 79.6 80.7  PLT 215 199 235 222 [id number]    Basic Metabolic Panel:  Recent Labs Lab 08/17/16 1448 08/18/16 0001 08/18/16 0532 08/19/16 0425 08/20/16 0502 08/21/16 0224  NA 133*  --  138 139 139 139  K 4.5  --  4.0 4.1 4.8 4.2  CL 103  --  104 101 102 101  CO2 19*  --  26 28 27 28   GLUCOSE 82  --  98 102* 114* 129*  BUN 17  --  13 17 24* 26*  CREATININE 1.11*  --  1.12* 1.12* 1.04* 1.18*  CALCIUM 8.9  --  9.0 9.1 9.7 9.9  MG  --  2.1  --   --   --   --     No results found for this or any previous visit (from the past 240 hour(s)).   Liver Function  Tests:  Recent Labs Lab 08/21/16 0224  AST 17  ALT 20  ALKPHOS 58  BILITOT 0.2*  PROT 5.6*  ALBUMIN 2.9*   No results for input(s): LIPASE, AMYLASE in the last 168 hours. No results for input(s): AMMONIA in the last 168 hours.  Cardiac Enzymes:  Recent Labs Lab 08/17/16 1926 08/18/16 0001 08/18/16 0532 08/18/16 1216  TROPONINI <0.03 <0.03 <0.03 <0.03   BNP (last 3 results)  Recent Labs  07/27/16 0952 08/10/16 1336 08/17/16 2151  BNP 723.5* 447.4* 343.5*    ProBNP (last 3 results) No results for input(s): PROBNP in the last 8760 hours.    Studies: No results found.  Scheduled Meds: . azelastine  2 spray Each Nare Daily  . citalopram  20 mg Oral Daily  . diltiazem  300 mg Oral Daily  . doxycycline (VIBRAMYCIN) IV  100 mg Intravenous Q12H  . ezetimibe  10 mg Oral Daily  . fluticasone  1 spray Each Nare BID  . furosemide  20 mg Oral Daily  . ipratropium-albuterol  3 mL Nebulization Q6H  . Melatonin  3 mg Oral QHS  . mirabegron ER  50 mg Oral Daily  . mometasone-formoterol  2 puff Inhalation BID  . montelukast  10 mg Oral Daily  . multivitamin with minerals  1 tablet Oral Daily  . pantoprazole  40 mg Oral Daily  . [START ON 08/22/2016] predniSONE  30 mg Oral Q breakfast  . tiotropium  18 mcg Inhalation Daily  . vitamin E  400 Units Oral Daily    Continuous Infusions: . heparin      Time spent: 25 min [facility] Pager [phone] . If 7PM-7AM, please contact night-coverage at [email or web address], Office  [phone]  password [email or web address] 08/21/2016, 12:56 PM  LOS: 3 days

## 2016-08-21 NOTE — Progress Notes (Signed)
ANTICOAGULATION CONSULT NOTE -Initial  Pharmacy Consult for : change from Apixaban to IV heparin  Indication:  H/o  atrial fibrillation  Allergies  Allergen Reactions  . Clarithromycin Other (See Comments)    REACTION: possible rash but could have been legionarres dz with rash  . Lipitor [Atorvastatin] Other (See Comments)    MUSCLE SPASMS  . Simvastatin Other (See Comments)    Muscle and joint pain  . Crestor [Rosuvastatin Calcium]     fagitue and joint pain, "NO STATINS"    Patient Measurements: Height: 5\' 4"  (162.6 cm) Weight: 137 lb 11.2 oz (62.5 kg) (scale c) IBW/kg (Calculated) : 54.7  Vital Signs: Temp: 98.1 F (36.7 C) (09/17 0607) Temp Source: Oral (09/17 0607) BP: 136/68 (09/17 0933) Pulse Rate: 94 (09/17 0607)  Labs:  Recent Labs  08/18/16 1216 08/18/16 1757 08/18/16 1758 08/19/16 0425 08/20/16 0502 08/21/16 0224  HGB 11.0*  --   --  10.2*  --  9.8*  HCT 34.9*  --   --  31.9*  --  31.4*  PLT 235  --   --  222  --  292  APTT  --   --  63* 92*  --   --   HEPARINUNFRC  --  1.07*  --  1.04*  --   --   CREATININE  --   --   --  1.12* 1.04* 1.18*  TROPONINI <0.03  --   --   --   --   --     Estimated Creatinine Clearance: 34.5 mL/min (by C-G formula based on SCr of 1.18 mg/dL (H)).  Medical History: Past Medical History:  Diagnosis Date  . Allergic rhinitis   . Allergy   . Anxiety   . Basal cell carcinoma   . Cataract    bil cataracts removed  . COPD (chronic obstructive pulmonary disease) ([place])   . Depression 11/29/2013  . DI (detrusor instability)   . Dysrhythmia   . Esophageal stricture   . GERD (gastroesophageal reflux disease)   . Hemorrhoids   . Hiatal hernia   . History of cerebrovascular accident   . History of kidney stones   . HLD (hyperlipidemia)   . HTN (hypertension)   . Nephrolithiasis    hx  . Osteoarthritis   . Pneumonia 2013  . Shingles since Nov 18, 2013   on right arm from shoulder to wrist  . Shortness of breath  dyspnea    WITH EXERTION   . Status post dilation of esophageal narrowing   . Stroke [facility]) 2001   STROKES, TIA'S  . TIA (transient ischemic attack) last 2001   anticoagulation therapy on coumadin    Medications:  Prescriptions Prior to Admission  Medication Sig Dispense Refill Last Dose  . albuterol (PROVENTIL HFA;VENTOLIN HFA) 108 (90 BASE) MCG/ACT inhaler Inhale 1-2 puffs into the lungs every 6 (six) hours as needed for wheezing or shortness of breath. 3 Inhaler 4 08/16/2016 at pm  . apixaban (ELIQUIS) 5 MG TABS tablet Take 1 tablet (5 mg total) by mouth 2 (two) times daily. 60 tablet 0 08/17/2016 at 0530  . azelastine (ASTELIN) 0.1 % nasal spray Place 2 sprays into the nose daily. Use in each nostril as directed (Patient taking differently: Place 2 sprays into both nostrils daily. ) 90 mL 1 08/17/2016 at am  . benzonatate (TESSALON) 100 MG capsule Take 1 capsule (100 mg total) by mouth 3 (three) times daily as needed for cough. 90 capsule 0 Past Week  at Unknown time  . budesonide-formoterol (SYMBICORT) 160-4.5 MCG/ACT inhaler Inhale 2 puffs into the lungs 2 (two) times daily. 3 Inhaler 4 08/17/2016 at am  . Calcium Carbonate-Vitamin D (CALCIUM + D PO) Take 1 tablet by mouth 2 (two) times daily.    08/17/2016 at am  . citalopram (CELEXA) 20 MG tablet Take 1 tablet (20 mg total) by mouth daily. 90 tablet 3 08/17/2016 at am  . cyclobenzaprine (FLEXERIL) 5 MG tablet Take 1 tablet (5 mg total) by mouth 3 (three) times daily as needed for muscle spasms. 270 tablet 0 Past Month at Unknown time  . diltiazem (CARDIZEM CD) 300 MG 24 hr capsule Take 1 capsule (300 mg total) by mouth daily. 90 capsule 0 08/17/2016 at am  . ezetimibe (ZETIA) 10 MG tablet Take 1 tablet (10 mg total) by mouth daily. 90 tablet 3 08/17/2016 at am  . fluticasone (FLONASE) 50 MCG/ACT nasal spray Place 1 spray into both nostrils 2 (two) times daily. 48 g 1 08/17/2016 at am  . furosemide (LASIX) 20 MG tablet Take 1 tablet (20 mg total)  by mouth daily. (Patient taking differently: Take 20 mg by mouth every morning. ) 90 tablet 0 08/17/2016 at am  . guaiFENesin (MUCINEX) 600 MG 12 hr tablet Take 1,200 mg by mouth 2 (two) times daily as needed for cough or to loosen phlegm.   08/17/2016 at am  . Lidocaine HCl 2 % CREA Apply 1 application topically daily as needed (for shingles flares).     . LORazepam (ATIVAN) 0.5 MG tablet Take 1 tablet (0.5 mg total) by mouth 2 (two) times daily as needed for anxiety. 15 tablet 0 Past Month at Unknown time  . Magnesium 250 MG TABS Take 1 tablet by mouth daily as needed. Leg cramps   Past Week at Unknown time  . Melatonin 1 MG TABS Take 3 mg by mouth at bedtime.    08/16/2016 at pm  . meloxicam (MOBIC) 15 MG tablet Take 15 mg by mouth daily.   08/17/2016 at am  . metoprolol succinate (TOPROL XL) 25 MG 24 hr tablet Take 1 tablet (25 mg total) by mouth daily. 30 tablet 0 08/17/2016 at 0530  . mirabegron ER (MYRBETRIQ) 50 MG TB24 tablet Take 50 mg by mouth daily.   08/16/2016 at pm  . montelukast (SINGULAIR) 10 MG tablet Take 10 mg by mouth daily.   08/17/2016 at am  . Multiple Vitamin (MULTIVITAMIN) tablet Take 1 tablet by mouth daily.     08/17/2016 at am  . omeprazole (PRILOSEC) 40 MG capsule Take 1 capsule (40 mg total) by mouth daily. 90 capsule 3 08/17/2016 at am  . Respiratory Therapy Supplies (FLUTTER) DEVI Use 2-3 times per day 1 each 0 Taking  . Spacer/Aero-Holding Chambers (AEROCHAMBER Z-STAT PLUS) inhaler Use as instructed 1 each 0 Taking  . Tiotropium Bromide Monohydrate (SPIRIVA RESPIMAT) 2.5 MCG/ACT AERS Inhale 2 puffs into the lungs daily. 3 Inhaler 3 08/17/2016 at am  . vitamin E 400 UNIT capsule Take 400 Units by mouth daily.   08/16/2016 at pm   Scheduled:  . azelastine  2 spray Each Nare Daily  . citalopram  20 mg Oral Daily  . diltiazem  300 mg Oral Daily  . doxycycline (VIBRAMYCIN) IV  100 mg Intravenous Q12H  . ezetimibe  10 mg Oral Daily  . fluticasone  1 spray Each Nare BID  .  furosemide  20 mg Oral Daily  . ipratropium-albuterol  3 mL Nebulization Q6H  .  Melatonin  3 mg Oral QHS  . mirabegron ER  50 mg Oral Daily  . mometasone-formoterol  2 puff Inhalation BID  . montelukast  10 mg Oral Daily  . multivitamin with minerals  1 tablet Oral Daily  . pantoprazole  40 mg Oral Daily  . [START ON 08/22/2016] predniSONE  30 mg Oral Q breakfast  . tiotropium  18 mcg Inhalation Daily  . vitamin E  400 Units Oral Daily   Assessment: 75yo female on apixaban PTA for Afib  presented on 08/17/16 w/ hemoptysis (described as two large rust-colored clots) and acute on chronic CHF. Transitioned to heparin IV drip on 08/18/16.  No further bleeding.  Transitioned back to her PTA Eliquis (apixaban) dose of 5mg  BID on 08/19/16.   -today pharmacy consulted to switch back to IV heparin drip as patient needs a cardiac cath.  Apixaban 5mg  dose already given this AM, thus we will need wait 12 hours before starting IV heparin drip . We will monitor and adjust heparin drip based on aPTT until effect of apixaban diminshes enough then we will monitor the heparin level.  Currently  Hgb 9.8, pltc wnl, and no bleeding reported.  Previously aPTT therapeutic on IV heparin rate 1100 units/hr.   Goal of Therapy:  aPTT goal 66-102 seconds Heparin level 0.3-0.7 (monitor HL once effect of Apixaban has diminished) Monitor platelets by anticoagulation protocol: Yes   Plan:  Apixaban discontinued Tonight at 21:30 start IV heparin infusion at gtt at 1100 units/hr (no bolus)  Verify aPTT 8 hours Monitor daily  aPTT while Eliquis is being cleared, heparin level and CBC.  Nicole Cella, RPh Clinical Pharmacist Pager: [phone] 08/21/2016 11:43 AM

## 2016-08-22 ENCOUNTER — Encounter (HOSPITAL_COMMUNITY)
Admission: EM | Disposition: A | Discharge status: Home / Self Care | Payer: Self-pay | Source: Home / Self Care | Attending: Family Medicine

## 2016-08-22 ENCOUNTER — Encounter (HOSPITAL_COMMUNITY): Payer: Self-pay | Admitting: Cardiology

## 2016-08-22 ENCOUNTER — Ambulatory Visit: Payer: Medicare Other | Admitting: Pulmonary Disease

## 2016-08-22 HISTORY — PX: CARDIAC CATHETERIZATION: SHX172

## 2016-08-22 LAB — APTT: aPTT: 65 seconds — ABNORMAL HIGH (ref 24–36)

## 2016-08-22 LAB — BASIC METABOLIC PANEL
Anion gap: 22 — ABNORMAL HIGH (ref 5–15)
BUN: 27 mg/dL — ABNORMAL HIGH (ref 6–20)
CO2: 24 mmol/L (ref 22–32)
Calcium: 8.4 mg/dL — ABNORMAL LOW (ref 8.9–10.3)
Chloride: 99 mmol/L — ABNORMAL LOW (ref 101–111)
Creatinine, Ser: 1.01 mg/dL — ABNORMAL HIGH (ref 0.44–1.00)
GFR calc Af Amer: >60 mL/min (ref >60)
GFR calc non Af Amer: 52 mL/min — ABNORMAL LOW (ref >60)
Glucose, Bld: 91 mg/dL (ref 65–99)
Potassium: 4.4 mmol/L (ref 3.5–5.1)
Sodium: 145 mmol/L (ref 135–145)

## 2016-08-22 LAB — HEPARIN LEVEL (UNFRACTIONATED): Heparin Unfractionated: 1.6 IU/mL — ABNORMAL HIGH (ref 0.30–0.70)

## 2016-08-22 LAB — PROCALCITONIN: Procalcitonin: <0.1 ng/mL

## 2016-08-22 SURGERY — LEFT HEART CATH AND CORONARY ANGIOGRAPHY
Anesthesia: LOCAL

## 2016-08-22 MED ORDER — SODIUM CHLORIDE 0.9% FLUSH: 3.0000 mL | INTRAVENOUS | Status: DC | PRN

## 2016-08-22 MED ORDER — IPRATROPIUM-ALBUTEROL 0.5-2.5 (3) MG/3ML IN SOLN
3.0000 mL | Freq: Three times a day (TID) | RESPIRATORY_TRACT | Status: DC
Start: 2016-08-22 — End: 2016-08-23
  Administered 2016-08-22 – 2016-08-23 (×3): 3 mL via RESPIRATORY_TRACT
  Filled 2016-08-22 (×3): qty 3

## 2016-08-22 MED ORDER — HEPARIN SODIUM (PORCINE) 1000 UNIT/ML IJ SOLN
INTRAMUSCULAR | Status: DC | PRN
  Administered 2016-08-22: 3500 [IU] via INTRAVENOUS

## 2016-08-22 MED ORDER — MIDAZOLAM HCL 2 MG/2ML IJ SOLN
INTRAMUSCULAR | Status: AC
  Filled 2016-08-22: qty 2

## 2016-08-22 MED ORDER — FENTANYL CITRATE (PF) 100 MCG/2ML IJ SOLN
INTRAMUSCULAR | Status: DC | PRN
  Administered 2016-08-22: 25 ug via INTRAVENOUS

## 2016-08-22 MED ORDER — MIDAZOLAM HCL 2 MG/2ML IJ SOLN
INTRAMUSCULAR | Status: DC | PRN
  Administered 2016-08-22: 1 mg via INTRAVENOUS

## 2016-08-22 MED ORDER — SODIUM CHLORIDE 0.9 % WEIGHT BASED INFUSION: 1.0000 mL/kg/h | INTRAVENOUS | Status: AC

## 2016-08-22 MED ORDER — FENTANYL CITRATE (PF) 100 MCG/2ML IJ SOLN
INTRAMUSCULAR | Status: AC
  Filled 2016-08-22: qty 2

## 2016-08-22 MED ORDER — APIXABAN 5 MG PO TABS
5.0000 mg | ORAL_TABLET | Freq: Two times a day (BID) | ORAL | Status: DC
  Administered 2016-08-23: 5 mg via ORAL
  Filled 2016-08-22: qty 1

## 2016-08-22 MED ORDER — LIDOCAINE HCL (PF) 1 % IJ SOLN
INTRAMUSCULAR | Status: AC
  Filled 2016-08-22: qty 30

## 2016-08-22 MED ORDER — HEPARIN SODIUM (PORCINE) 1000 UNIT/ML IJ SOLN
INTRAMUSCULAR | Status: AC
  Filled 2016-08-22: qty 1

## 2016-08-22 MED ORDER — VERAPAMIL HCL 2.5 MG/ML IV SOLN
INTRAVENOUS | Status: AC
  Filled 2016-08-22: qty 2

## 2016-08-22 MED ORDER — HEPARIN (PORCINE) IN NACL 2-0.9 UNIT/ML-% IJ SOLN
INTRAMUSCULAR | Status: AC
  Filled 2016-08-22: qty 1000

## 2016-08-22 MED ORDER — SODIUM CHLORIDE 0.9 % IV SOLN: 250.0000 mL | INTRAVENOUS | Status: DC | PRN

## 2016-08-22 MED ORDER — LIDOCAINE HCL (PF) 1 % IJ SOLN
INTRAMUSCULAR | Status: DC | PRN
  Administered 2016-08-22: 2 mL

## 2016-08-22 MED ORDER — IOPAMIDOL (ISOVUE-370) INJECTION 76%
INTRAVENOUS | Status: DC | PRN
  Administered 2016-08-22: 50 mL via INTRA_ARTERIAL

## 2016-08-22 MED ORDER — IOPAMIDOL (ISOVUE-370) INJECTION 76%
INTRAVENOUS | Status: AC
  Filled 2016-08-22: qty 100

## 2016-08-22 MED ORDER — VERAPAMIL HCL 2.5 MG/ML IV SOLN
INTRAVENOUS | Status: DC | PRN
  Administered 2016-08-22: 10 mL via INTRA_ARTERIAL

## 2016-08-22 MED ORDER — DOXYCYCLINE HYCLATE 100 MG PO TABS
100.0000 mg | ORAL_TABLET | Freq: Two times a day (BID) | ORAL | Status: DC
  Administered 2016-08-22 – 2016-08-23 (×2): 100 mg via ORAL
  Filled 2016-08-22 (×2): qty 1

## 2016-08-22 MED ORDER — HEPARIN (PORCINE) IN NACL 2-0.9 UNIT/ML-% IJ SOLN
INTRAMUSCULAR | Status: DC | PRN
  Administered 2016-08-22: 1000 mL

## 2016-08-22 MED ORDER — SODIUM CHLORIDE 0.9% FLUSH
3.0000 mL | Freq: Two times a day (BID) | INTRAVENOUS | Status: DC
  Administered 2016-08-23: 3 mL via INTRAVENOUS

## 2016-08-22 SURGICAL SUPPLY — 9 items
CATH IMPULSE 5F ANG/FL3.5 (CATHETERS) ×1 IMPLANT
DEVICE RAD COMP TR BAND LRG (VASCULAR PRODUCTS) ×1 IMPLANT
GLIDESHEATH SLEND SS 6F .021 (SHEATH) ×1 IMPLANT
KIT HEART LEFT (KITS) ×2 IMPLANT
PACK CARDIAC CATHETERIZATION (CUSTOM PROCEDURE TRAY) ×2 IMPLANT
SYR MEDRAD MARK V 150ML (SYRINGE) ×2 IMPLANT
TRANSDUCER W/STOPCOCK (MISCELLANEOUS) ×2 IMPLANT
TUBING CIL FLEX 10 FLL-RA (TUBING) ×2 IMPLANT
WIRE SAFE-T 1.5MM-J .035X260CM (WIRE) ×1 IMPLANT

## 2016-08-22 NOTE — Progress Notes (Signed)
Patient returned from cardiac cath. Procedure site intact. Dime sized bleeding noted. Balloon inflated. Will deflate per order and continue to monitor site. Hermina Barters Rn 08/22/2016 3:24 PM

## 2016-08-22 NOTE — Progress Notes (Signed)
[facility]  PROGRESS NOTE  [name] [id number] DOB: 22-Aug-1941 DOA: 08/17/2016 PCP: Cathlean Cower, MD   Brief HPI:  75 y.o. with a history of chronic atrial fibrillation, COPD, combined systolic and diastolic heart failure. He presents with dyspnea and hemoptysis. CTA of her chest ruled out PE with evidence of pneumonitis versus pulmonary congestion and worsening emphysema.she was started on steroids and IV lasix with some improvement. Cardiology consulted and plan for cardiac cath  Subjective   Pt has no new complaints.    Assessment/Plan:    1. Acute on chronic diastolic and systolic heart failure- patient started on IV Lasix, changed to po lasix 20 mg daily by cardiology. Net -3.8 L.  Plan for cardiac cath today.  2. MIld acute COPD exacerbation- mild, started on prednisone 40 mg by mouth daily, currently tapering over the next one week. Resume doxycycline. Tessalon and robitussin added.  3. Hemoptysis-resolved, likely secondary to pulmonary edema. None today.  4. Acute kidney injury- baseline around 1. Currently 1.01 with diuresis. Monitor.  5. Paroxysmal atrial fibrillation- continue Cardizem, metoprolol. eliquis   DVT prophylaxis: Eliquis  Code Status: Full code  Family Communication: No family at bedside   Disposition Plan: Pending improvement in respiratory status/ cardiac cath in am.    Consultants:  Cardiology   Procedures:  None  Antibiotics:   Anti-infectives    Start     Dose/Rate Route Frequency Ordered Stop   08/22/16 2200  doxycycline (VIBRA-TABS) tablet 100 mg     100 mg Oral Every 12 hours 08/22/16 1510     08/18/16 1800  doxycycline (VIBRAMYCIN) 100 mg in dextrose 5 % 250 mL IVPB  Status:  Discontinued     100 mg 125 mL/hr over 120 Minutes Intravenous Every 12 hours 08/18/16 0600 08/22/16 1510   08/18/16 0615  doxycycline (VIBRAMYCIN) 100 mg in dextrose 5 % 250 mL IVPB     100 mg 125 mL/hr over 120 Minutes Intravenous  Once 08/18/16  0602 08/18/16 1133       Objective   Vitals:   08/22/16 1402 08/22/16 1407 08/22/16 1412 08/22/16 1416  BP: 121/64 117/64 114/62 (!) 114/59  Pulse: (!) 55 (!) 49 (!) 56 (!) 49  Resp: 14 16 17 14   Temp:      TempSrc:      SpO2: 94% 96% 93% 95%  Weight:      Height:        Intake/Output Summary (Last 24 hours) at 08/22/16 1517 Last data filed at 08/22/16 1406  Gross per 24 hour  Intake          1682.99 ml  Output             3025 ml  Net         -1342.01 ml   Filed Weights   08/20/16 0514 08/21/16 0607 08/22/16 0540  Weight: 62.8 kg (138 lb 6.4 oz) 62.5 kg (137 lb 11.2 oz) 62.8 kg (138 lb 8 oz)   Physical Examination:  General exam: Appears calm and comfortable. In NAD Respiratory system: equal chest rise, no rhales Cardiovascular system:  RRR. No  murmurs, rubs, gallops. No pedal edema. GI system: Abdomen is nondistended, soft and nontender. No organomegaly.  Psychiatry: Alert, oriented x 3. Judgement and insight appear normal. Affect normal.  Data Reviewed: I have personally reviewed following labs and imaging studies  CBG: No results for input(s): GLUCAP in the last 168 hours.  CBC:  Recent Labs Lab 08/18/16 0001 08/18/16  0532 08/18/16 1216 08/19/16 0425 08/21/16 0224  WBC 5.1 4.3 4.6 4.3 4.9  HGB 10.7* 10.3* 11.0* 10.2* 9.8*  HCT 34.3* 32.6* 34.9* 31.9* 31.4*  MCV 80.3 80.5 80.0 79.6 80.7  PLT 215 199 235 222 [id number]    Basic Metabolic Panel:  Recent Labs Lab 08/18/16 0001 08/18/16 0532 08/19/16 0425 08/20/16 0502 08/21/16 0224 08/22/16 0513  NA  --  138 139 139 139 145  K  --  4.0 4.1 4.8 4.2 4.4  CL  --  104 101 102 101 99*  CO2  --  26 28 27 28 24   GLUCOSE  --  98 102* 114* 129* 91  BUN  --  13 17 24* 26* 27*  CREATININE  --  1.12* 1.12* 1.04* 1.18* 1.01*  CALCIUM  --  9.0 9.1 9.7 9.9 8.4*  MG 2.1  --   --   --   --   --     No results found for this or any previous visit (from the past 240 hour(s)).   Liver Function Tests:  Recent  Labs Lab 08/21/16 0224  AST 17  ALT 20  ALKPHOS 58  BILITOT 0.2*  PROT 5.6*  ALBUMIN 2.9*   No results for input(s): LIPASE, AMYLASE in the last 168 hours. No results for input(s): AMMONIA in the last 168 hours.  Cardiac Enzymes:  Recent Labs Lab 08/17/16 1926 08/18/16 0001 08/18/16 0532 08/18/16 1216  TROPONINI <0.03 <0.03 <0.03 <0.03   BNP (last 3 results)  Recent Labs  07/27/16 0952 08/10/16 1336 08/17/16 2151  BNP 723.5* 447.4* 343.5*    ProBNP (last 3 results) No results for input(s): PROBNP in the last 8760 hours.  Studies: No results found.  Scheduled Meds: . azelastine  2 spray Each Nare Daily  . citalopram  20 mg Oral Daily  . diltiazem  300 mg Oral Daily  . doxycycline  100 mg Oral Q12H  . ezetimibe  10 mg Oral Daily  . fluticasone  1 spray Each Nare BID  . furosemide  20 mg Oral Daily  . ipratropium-albuterol  3 mL Nebulization TID  . Melatonin  3 mg Oral QHS  . mirabegron ER  50 mg Oral Daily  . mometasone-formoterol  2 puff Inhalation BID  . montelukast  10 mg Oral Daily  . multivitamin with minerals  1 tablet Oral Daily  . pantoprazole  40 mg Oral Daily  . predniSONE  30 mg Oral Q breakfast  . sodium chloride flush  3 mL Intravenous Q12H  . vitamin E  400 Units Oral Daily    Continuous Infusions: . sodium chloride      Time spent: 25 min Velvet Bathe   [facility] Pager [phone] . If 7PM-7AM, please contact night-coverage at [email or web address], Office  [phone]  password [email or web address] 08/22/2016, 3:17 PM  LOS: 4 days

## 2016-08-22 NOTE — H&P (View-Only) (Signed)
Patient Name: [name] Date of Encounter: 08/22/2016  Principal Problem:   Acute on chronic combined systolic (congestive) and diastolic (congestive) heart failure (HCC) Active Problems:   PAF (paroxysmal atrial fibrillation) (HCC)   COPD (chronic obstructive pulmonary disease) (HCC)   CHF (congestive heart failure) (HCC)   Hemoptysis   Chronic atrial fibrillation (HCC)   Acute on chronic respiratory failure with hypoxia ([place])   Length of Stay: 4  SUBJECTIVE  She feels  improvement in SOB able to lay down for heart cath.   CURRENT MEDS . azelastine  2 spray Each Nare Daily  . citalopram  20 mg Oral Daily  . diltiazem  300 mg Oral Daily  . doxycycline (VIBRAMYCIN) IV  100 mg Intravenous Q12H  . ezetimibe  10 mg Oral Daily  . fluticasone  1 spray Each Nare BID  . furosemide  20 mg Oral Daily  . ipratropium-albuterol  3 mL Nebulization Q6H  . Melatonin  3 mg Oral QHS  . mirabegron ER  50 mg Oral Daily  . mometasone-formoterol  2 puff Inhalation BID  . montelukast  10 mg Oral Daily  . multivitamin with minerals  1 tablet Oral Daily  . pantoprazole  40 mg Oral Daily  . predniSONE  30 mg Oral Q breakfast  . sodium chloride flush  3 mL Intravenous Q12H  . tiotropium  18 mcg Inhalation Daily  . vitamin E  400 Units Oral Daily   OBJECTIVE  Vitals:   08/21/16 1200 08/21/16 2049 08/21/16 2145 08/22/16 0540  BP: (!) 128/56  (!) 132/57 112/74  Pulse: (!) 54  61 (!) 58  Resp: 20  18 19   Temp: 98.3 F (36.8 C)  98.6 F (37 C) 98 F (36.7 C)  TempSrc: Oral  Oral Oral  SpO2: 93% 96% 97% 98%  Weight:    138 lb 8 oz (62.8 kg)  Height:        Intake/Output Summary (Last 24 hours) at 08/22/16 1212 Last data filed at 08/22/16 1120  Gross per 24 hour  Intake          1922.99 ml  Output             2700 ml  Net          -777.01 ml   Filed Weights   08/20/16 0514 08/21/16 0607 08/22/16 0540  Weight: 138 lb 6.4 oz (62.8 kg) 137 lb 11.2 oz (62.5 kg) 138 lb 8 oz (62.8  kg)   PHYSICAL EXAM  General: Pleasant, NAD. Neuro: Alert and oriented X 3. Moves all extremities spontaneously. Psych: Normal affect. HEENT:  Normal  Neck: Supple without bruits or JVD. Lungs:  Resp regular and unlabored, mild crackles B/L, poor air movement Heart: RRR no s3, s4, or murmurs. Abdomen: Soft, non-tender, non-distended, BS + x 4.  Extremities: No clubbing, cyanosis or edema. DP/PT/Radials 2+ and equal bilaterally.  Accessory Clinical Findings  CBC  Recent Labs  08/21/16 0224  WBC 4.9  HGB 9.8*  HCT 31.4*  MCV 80.7  PLT 123456   Basic Metabolic Panel  Recent Labs  08/21/16 0224 08/22/16 0513  NA 139 145  K 4.2 4.4  CL 101 99*  CO2 28 24  GLUCOSE 129* 91  BUN 26* 27*  CREATININE 1.18* 1.01*  CALCIUM 9.9 8.4*   Pt profile:  75 y.o. 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 COPD-  followed by Dr Ashok Cordia, (she was recently placed on steroids for a cough), GERD s/p esophageal dilatation May 2017, HTN with EF 45% grade 2 DD by echo 06/2016, HLD, hx bradycardia and PVCs. S-D-CHF, HTN, HLD.  Admitted on 8/23-8/29 for acute on chronic resp failure, acute diagnosed on chronic combined systolic and diastolic CHF (LVEF Q000111Q, grade 2 DD), w/ afib (RVR) s/p DCCV>SR>> Atrial fib.  Admitted again on 9/6 - 9/7 after a syncope after Lasix increase for edema, afib RVR and CHF exacerbation. Weight at d/c 141. F/u scheduled for 09/15.   Pt admitted 09/13 w/ hemoptysis x 1, after dry cough, now completely resolved, CXR didnt show significant CHF, however crackles on physical exam, as well as ongoing wheezing. She has diuresed 4.5 L    ASSESSMENT AND PLAN   Acute on chronic combined systolic (congestive) and diastolic (congestive) heart failure ([place]) - she feels better after diuresis, continue with reduced lasix to 20mg  oral.   - BUN as high as 42 previous admit, Cr was 1.03 - follow closely,  with diuresis and recent dye    Persistent atrial fibrillation ([place]) - afib may be contributing to CHF - however, with LVD (mild) and unclear if has CAD, antiarrhythmic options are limited.  - HR generally controlled, despite respiratory problems. - changed Toprol XL to bedtime, to hopefully avoid post-med hypotension - CHA2DS2VASc=7 (HTN, CHF, age x 2, CVA x 2, female) - INR sub-therapeutic on admit>>heparin - holding coumadin for heart cath      COPD (chronic obstructive pulmonary disease) (HCC)  - prednisone 40 mg po x 3 days,   - albuterol/atrovent. Walking test and home O2 evaluation prior to the discharge.   The patient is progressing slowly. This is the second episode of CHF in a short period of time, she has had exertional CP, DOE, she has decreased LVEF, she is not a good candidate for Lexiscan as she is actively wheezing, unable to walk. There is significant calcium on chest CT and coronary CTA would be suboptimal.  On for cardiac catheterization for today.   Signed, Candee Furbish MD, [facility] 08/22/2016

## 2016-08-22 NOTE — Progress Notes (Addendum)
Patient Name: [name] Date of Encounter: 08/22/2016  Principal Problem:   Acute on chronic combined systolic (congestive) and diastolic (congestive) heart failure (HCC) Active Problems:   PAF (paroxysmal atrial fibrillation) (HCC)   COPD (chronic obstructive pulmonary disease) (HCC)   CHF (congestive heart failure) (HCC)   Hemoptysis   Chronic atrial fibrillation (HCC)   Acute on chronic respiratory failure with hypoxia ([place])   Length of Stay: 4  SUBJECTIVE  She feels  improvement in SOB able to lay down for heart cath.   CURRENT MEDS . azelastine  2 spray Each Nare Daily  . citalopram  20 mg Oral Daily  . diltiazem  300 mg Oral Daily  . doxycycline (VIBRAMYCIN) IV  100 mg Intravenous Q12H  . ezetimibe  10 mg Oral Daily  . fluticasone  1 spray Each Nare BID  . furosemide  20 mg Oral Daily  . ipratropium-albuterol  3 mL Nebulization Q6H  . Melatonin  3 mg Oral QHS  . mirabegron ER  50 mg Oral Daily  . mometasone-formoterol  2 puff Inhalation BID  . montelukast  10 mg Oral Daily  . multivitamin with minerals  1 tablet Oral Daily  . pantoprazole  40 mg Oral Daily  . predniSONE  30 mg Oral Q breakfast  . sodium chloride flush  3 mL Intravenous Q12H  . tiotropium  18 mcg Inhalation Daily  . vitamin E  400 Units Oral Daily   OBJECTIVE  Vitals:   08/21/16 1200 08/21/16 2049 08/21/16 2145 08/22/16 0540  BP: (!) 128/56  (!) 132/57 112/74  Pulse: (!) 54  61 (!) 58  Resp: 20  18 19   Temp: 98.3 F (36.8 C)  98.6 F (37 C) 98 F (36.7 C)  TempSrc: Oral  Oral Oral  SpO2: 93% 96% 97% 98%  Weight:    138 lb 8 oz (62.8 kg)  Height:        Intake/Output Summary (Last 24 hours) at 08/22/16 1212 Last data filed at 08/22/16 1120  Gross per 24 hour  Intake          1922.99 ml  Output             2700 ml  Net          -777.01 ml   Filed Weights   08/20/16 0514 08/21/16 0607 08/22/16 0540  Weight: 138 lb 6.4 oz (62.8 kg) 137 lb 11.2 oz (62.5 kg) 138 lb 8 oz (62.8  kg)   PHYSICAL EXAM  General: Pleasant, NAD. Neuro: Alert and oriented X 3. Moves all extremities spontaneously. Psych: Normal affect. HEENT:  Normal  Neck: Supple without bruits or JVD. Lungs:  Resp regular and unlabored, mild crackles B/L, poor air movement Heart: RRR no s3, s4, or murmurs. Abdomen: Soft, non-tender, non-distended, BS + x 4.  Extremities: No clubbing, cyanosis or edema. DP/PT/Radials 2+ and equal bilaterally.  Accessory Clinical Findings  CBC  Recent Labs  08/21/16 0224  WBC 4.9  HGB 9.8*  HCT 31.4*  MCV 80.7  PLT 123456   Basic Metabolic Panel  Recent Labs  08/21/16 0224 08/22/16 0513  NA 139 145  K 4.2 4.4  CL 101 99*  CO2 28 24  GLUCOSE 129* 91  BUN 26* 27*  CREATININE 1.18* 1.01*  CALCIUM 9.9 8.4*   Pt profile:  75 y.o. 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 COPD-  followed by Dr Ashok Cordia, (she was recently placed on steroids for a cough), GERD s/p esophageal dilatation May 2017, HTN with EF 45% grade 2 DD by echo 06/2016, HLD, hx bradycardia and PVCs. S-D-CHF, HTN, HLD.  Admitted on 8/23-8/29 for acute on chronic resp failure, acute diagnosed on chronic combined systolic and diastolic CHF (LVEF Q000111Q, grade 2 DD), w/ afib (RVR) s/p DCCV>SR>> Atrial fib.  Admitted again on 9/6 - 9/7 after a syncope after Lasix increase for edema, afib RVR and CHF exacerbation. Weight at d/c 141. F/u scheduled for 09/15.   Pt admitted 09/13 w/ hemoptysis x 1, after dry cough, now completely resolved, CXR didnt show significant CHF, however crackles on physical exam, as well as ongoing wheezing. She has diuresed 4.5 L    ASSESSMENT AND PLAN   Acute on chronic combined systolic (congestive) and diastolic (congestive) heart failure ([place]) - she feels better after diuresis, continue with reduced lasix to 20mg  oral.   - BUN as high as 42 previous admit, Cr was 1.03 - follow closely,  with diuresis and recent dye    Persistent atrial fibrillation ([place]) - afib may be contributing to CHF - however, with LVD (mild) and unclear if has CAD, antiarrhythmic options are limited.  - HR generally controlled, despite respiratory problems. - changed Toprol XL to bedtime, to hopefully avoid post-med hypotension - CHA2DS2VASc=7 (HTN, CHF, age x 2, CVA x 2, female) - INR sub-therapeutic on admit>>heparin - holding Eliquis for heart cath. Resume tomorrow post cath.      COPD (chronic obstructive pulmonary disease) (HCC)  - prednisone 40 mg po x 3 days,   - albuterol/atrovent. Walking test and home O2 evaluation prior to the discharge.   The patient is progressing slowly. This is the second episode of CHF in a short period of time, she has had exertional CP, DOE, she has decreased LVEF, she is not a good candidate for Lexiscan as she is actively wheezing, unable to walk. There is significant calcium on chest CT and coronary CTA would be suboptimal.  On for cardiac catheterization for today.   Signed, Candee Furbish MD, [facility] 08/22/2016

## 2016-08-22 NOTE — Care Management Important Message (Signed)
Important Message  Patient Details  Name: [name] MRN: [id number] Date of Birth: 03/03/1941   Medicare Important Message Given:  Yes    Orbie Pyo 08/22/2016, 10:48 AM

## 2016-08-22 NOTE — Progress Notes (Signed)
D/w Dr Marlou Porch: plan to resume apixaban 5 mg po BID tomorrow morning Eudelia Bunch, Pharm.D. [id number] 08/22/2016 3:18 PM

## 2016-08-22 NOTE — Interval H&P Note (Signed)
History and Physical Interval Note:  08/22/2016 1:59 PM  GEAN STEFANOWICZ  has presented today for surgery, with the diagnosis of unstable angina  The various methods of treatment have been discussed with the patient and family. After consideration of risks, benefits and other options for treatment, the patient has consented to  Procedure(s): Left Heart Cath and Coronary Angiography (N/A) as a surgical intervention .  The patient's history has been reviewed, patient examined, no change in status, stable for surgery.  I have reviewed the patient's chart and labs.  Questions were answered to the patient's satisfaction.    Cath Lab Visit (complete for each Cath Lab visit)  Clinical Evaluation Leading to the Procedure:   ACS: No.  Non-ACS:    Anginal Classification: CCS III  Anti-ischemic medical therapy: Minimal Therapy (1 class of medications)  Non-Invasive Test Results: No non-invasive testing performed  Prior CABG: No previous CABG       Bernarr Longsworth [place] MD,FACC 08/22/2016 2:00 PM

## 2016-08-22 NOTE — Progress Notes (Signed)
ANTICOAGULATION CONSULT NOTE - Pharmacy Consult for : change from Apixaban to IV heparin  Indication:  H/o  atrial fibrillation  Allergies  Allergen Reactions  . Clarithromycin Other (See Comments)    REACTION: possible rash but could have been legionarres dz with rash  . Lipitor [Atorvastatin] Other (See Comments)    MUSCLE SPASMS  . Simvastatin Other (See Comments)    Muscle and joint pain  . Crestor [Rosuvastatin Calcium]     fagitue and joint pain, "NO STATINS"    Patient Measurements: Height: 5\' 4"  (162.6 cm) Weight: 138 lb 8 oz (62.8 kg) (c scale) IBW/kg (Calculated) : 54.7  Vital Signs: Temp: 98 F (36.7 C) (09/18 0540) Temp Source: Oral (09/18 0540) BP: 112/74 (09/18 0540) Pulse Rate: 58 (09/18 0540)  Labs:  Recent Labs  08/20/16 0502 08/21/16 0224 08/22/16 0513  HGB  --  9.8*  --   HCT  --  31.4*  --   PLT  --  292  --   APTT  --   --  65*  HEPARINUNFRC  --   --  1.60*  CREATININE 1.04* 1.18* 1.01*    Estimated Creatinine Clearance: 40.3 mL/min (by C-G formula based on SCr of 1.01 mg/dL (H)).  Medical History: Past Medical History:  Diagnosis Date  . Allergic rhinitis   . Allergy   . Anxiety   . Basal cell carcinoma   . Cataract    bil cataracts removed  . COPD (chronic obstructive pulmonary disease) ([place])   . Depression 11/29/2013  . DI (detrusor instability)   . Dysrhythmia   . Esophageal stricture   . GERD (gastroesophageal reflux disease)   . Hemorrhoids   . Hiatal hernia   . History of cerebrovascular accident   . History of kidney stones   . HLD (hyperlipidemia)   . HTN (hypertension)   . Nephrolithiasis    hx  . Osteoarthritis   . Pneumonia 2013  . Shingles since Nov 18, 2013   on right arm from shoulder to wrist  . Shortness of breath dyspnea    WITH EXERTION   . Status post dilation of esophageal narrowing   . Stroke [facility]) 2001   STROKES, TIA'S  . TIA (transient ischemic attack) last 2001   anticoagulation therapy on  coumadin    Medications:  Prescriptions Prior to Admission  Medication Sig Dispense Refill Last Dose  . albuterol (PROVENTIL HFA;VENTOLIN HFA) 108 (90 BASE) MCG/ACT inhaler Inhale 1-2 puffs into the lungs every 6 (six) hours as needed for wheezing or shortness of breath. 3 Inhaler 4 08/16/2016 at pm  . apixaban (ELIQUIS) 5 MG TABS tablet Take 1 tablet (5 mg total) by mouth 2 (two) times daily. 60 tablet 0 08/17/2016 at 0530  . azelastine (ASTELIN) 0.1 % nasal spray Place 2 sprays into the nose daily. Use in each nostril as directed (Patient taking differently: Place 2 sprays into both nostrils daily. ) 90 mL 1 08/17/2016 at am  . benzonatate (TESSALON) 100 MG capsule Take 1 capsule (100 mg total) by mouth 3 (three) times daily as needed for cough. 90 capsule 0 Past Week at Unknown time  . budesonide-formoterol (SYMBICORT) 160-4.5 MCG/ACT inhaler Inhale 2 puffs into the lungs 2 (two) times daily. 3 Inhaler 4 08/17/2016 at am  . Calcium Carbonate-Vitamin D (CALCIUM + D PO) Take 1 tablet by mouth 2 (two) times daily.    08/17/2016 at am  . citalopram (CELEXA) 20 MG tablet Take 1 tablet (20 mg total)  by mouth daily. 90 tablet 3 08/17/2016 at am  . cyclobenzaprine (FLEXERIL) 5 MG tablet Take 1 tablet (5 mg total) by mouth 3 (three) times daily as needed for muscle spasms. 270 tablet 0 Past Month at Unknown time  . diltiazem (CARDIZEM CD) 300 MG 24 hr capsule Take 1 capsule (300 mg total) by mouth daily. 90 capsule 0 08/17/2016 at am  . ezetimibe (ZETIA) 10 MG tablet Take 1 tablet (10 mg total) by mouth daily. 90 tablet 3 08/17/2016 at am  . fluticasone (FLONASE) 50 MCG/ACT nasal spray Place 1 spray into both nostrils 2 (two) times daily. 48 g 1 08/17/2016 at am  . furosemide (LASIX) 20 MG tablet Take 1 tablet (20 mg total) by mouth daily. (Patient taking differently: Take 20 mg by mouth every morning. ) 90 tablet 0 08/17/2016 at am  . guaiFENesin (MUCINEX) 600 MG 12 hr tablet Take 1,200 mg by mouth 2 (two) times  daily as needed for cough or to loosen phlegm.   08/17/2016 at am  . Lidocaine HCl 2 % CREA Apply 1 application topically daily as needed (for shingles flares).     . LORazepam (ATIVAN) 0.5 MG tablet Take 1 tablet (0.5 mg total) by mouth 2 (two) times daily as needed for anxiety. 15 tablet 0 Past Month at Unknown time  . Magnesium 250 MG TABS Take 1 tablet by mouth daily as needed. Leg cramps   Past Week at Unknown time  . Melatonin 1 MG TABS Take 3 mg by mouth at bedtime.    08/16/2016 at pm  . meloxicam (MOBIC) 15 MG tablet Take 15 mg by mouth daily.   08/17/2016 at am  . metoprolol succinate (TOPROL XL) 25 MG 24 hr tablet Take 1 tablet (25 mg total) by mouth daily. 30 tablet 0 08/17/2016 at 0530  . mirabegron ER (MYRBETRIQ) 50 MG TB24 tablet Take 50 mg by mouth daily.   08/16/2016 at pm  . montelukast (SINGULAIR) 10 MG tablet Take 10 mg by mouth daily.   08/17/2016 at am  . Multiple Vitamin (MULTIVITAMIN) tablet Take 1 tablet by mouth daily.     08/17/2016 at am  . omeprazole (PRILOSEC) 40 MG capsule Take 1 capsule (40 mg total) by mouth daily. 90 capsule 3 08/17/2016 at am  . Respiratory Therapy Supplies (FLUTTER) DEVI Use 2-3 times per day 1 each 0 Taking  . Spacer/Aero-Holding Chambers (AEROCHAMBER Z-STAT PLUS) inhaler Use as instructed 1 each 0 Taking  . Tiotropium Bromide Monohydrate (SPIRIVA RESPIMAT) 2.5 MCG/ACT AERS Inhale 2 puffs into the lungs daily. 3 Inhaler 3 08/17/2016 at am  . vitamin E 400 UNIT capsule Take 400 Units by mouth daily.   08/16/2016 at pm   Scheduled:  . aspirin  81 mg Oral Pre-Cath  . azelastine  2 spray Each Nare Daily  . citalopram  20 mg Oral Daily  . diltiazem  300 mg Oral Daily  . doxycycline (VIBRAMYCIN) IV  100 mg Intravenous Q12H  . ezetimibe  10 mg Oral Daily  . fluticasone  1 spray Each Nare BID  . furosemide  20 mg Oral Daily  . ipratropium-albuterol  3 mL Nebulization Q6H  . Melatonin  3 mg Oral QHS  . mirabegron ER  50 mg Oral Daily  .  mometasone-formoterol  2 puff Inhalation BID  . montelukast  10 mg Oral Daily  . multivitamin with minerals  1 tablet Oral Daily  . pantoprazole  40 mg Oral Daily  . predniSONE  30 mg Oral Q breakfast  . sodium chloride flush  3 mL Intravenous Q12H  . tiotropium  18 mcg Inhalation Daily  . vitamin E  400 Units Oral Daily   Assessment: 75yo female on apixaban PTA for Afib  presented on 08/17/16 w/ hemoptysis (described as two large rust-colored clots) and acute on chronic CHF. Transitioned to heparin IV drip on 08/18/16.  No further bleeding.  Transitioned back to her PTA Eliquis (apixaban) dose of 5mg  BID on 08/19/16.   -today pharmacy consulted to switch back to IV heparin drip as patient needs a cardiac cath.  Apixaban 5mg  dose already given this AM, thus we will need wait 12 hours before starting IV heparin drip . We will monitor and adjust heparin drip based on aPTT until effect of apixaban diminshes enough then we will monitor the heparin level.  --AM aPTT 65sec, HL 1.6 elevated as expected. Hgb 9.8 but stable.  Goal of Therapy:  aPTT goal 66-102 seconds Heparin level 0.3-0.7 (monitor HL once effect of Apixaban has diminished) Monitor platelets by anticoagulation protocol: Yes   Plan:  Increase IV heparin to 1150 units/hr Recheck HL in 6 hrs if hasn't already gone to cath   [employer]. Alford Highland, PharmD, [facility] Clinical Staff Pharmacist Pager [phone]  08/22/2016 6:21 AM

## 2016-08-23 ENCOUNTER — Encounter: Payer: Self-pay | Admitting: Cardiology

## 2016-08-23 ENCOUNTER — Encounter (HOSPITAL_COMMUNITY): Payer: Self-pay | Admitting: General Practice

## 2016-08-23 DIAGNOSIS — J438 Other emphysema: Secondary | ICD-10-CM

## 2016-08-23 MED ORDER — INFLUENZA VAC SPLIT QUAD 0.5 ML IM SUSY: 0.5000 mL | PREFILLED_SYRINGE | INTRAMUSCULAR | Status: DC

## 2016-08-23 MED ORDER — INFLUENZA VAC SPLIT QUAD 0.5 ML IM SUSY
0.5000 mL | PREFILLED_SYRINGE | Freq: Once | INTRAMUSCULAR | Status: AC
  Administered 2016-08-23: 0.5 mL via INTRAMUSCULAR

## 2016-08-23 MED ORDER — DOXYCYCLINE HYCLATE 100 MG PO TABS: 100.0000 mg | ORAL_TABLET | Freq: Two times a day (BID) | ORAL | 0 refills | Status: AC

## 2016-08-23 MED ORDER — IPRATROPIUM-ALBUTEROL 0.5-2.5 (3) MG/3ML IN SOLN: 3.0000 mL | Freq: Two times a day (BID) | RESPIRATORY_TRACT | Status: DC

## 2016-08-23 MED ORDER — METOPROLOL SUCCINATE ER 25 MG PO TB24: 25.0000 mg | ORAL_TABLET | Freq: Every day | ORAL | 0 refills | Status: DC

## 2016-08-23 NOTE — Progress Notes (Signed)
Patient Name: [name] Date of Encounter: 08/23/2016  Principal Problem:   Acute on chronic combined systolic (congestive) and diastolic (congestive) heart failure (HCC) Active Problems:   PAF (paroxysmal atrial fibrillation) (HCC)   COPD (chronic obstructive pulmonary disease) (HCC)   CHF (congestive heart failure) (HCC)   Hemoptysis   Chronic atrial fibrillation (HCC)   Acute on chronic respiratory failure with hypoxia ([place])   Length of Stay: 5  SUBJECTIVE  She feels improvement in SOB. Heart cath yesterday reassuring.   CURRENT MEDS . apixaban  5 mg Oral BID  . azelastine  2 spray Each Nare Daily  . citalopram  20 mg Oral Daily  . diltiazem  300 mg Oral Daily  . doxycycline  100 mg Oral Q12H  . ezetimibe  10 mg Oral Daily  . fluticasone  1 spray Each Nare BID  . furosemide  20 mg Oral Daily  . ipratropium-albuterol  3 mL Nebulization TID  . Melatonin  3 mg Oral QHS  . mirabegron ER  50 mg Oral Daily  . mometasone-formoterol  2 puff Inhalation BID  . montelukast  10 mg Oral Daily  . multivitamin with minerals  1 tablet Oral Daily  . pantoprazole  40 mg Oral Daily  . predniSONE  30 mg Oral Q breakfast  . sodium chloride flush  3 mL Intravenous Q12H  . vitamin E  400 Units Oral Daily   OBJECTIVE  Vitals:   08/23/16 0531 08/23/16 0841 08/23/16 0843 08/23/16 1135  BP: (!) 102/43   (!) 118/59  Pulse: 95   (!) 57  Resp: 18   16  Temp: 98.5 F (36.9 C)   98.4 F (36.9 C)  TempSrc: Oral   Oral  SpO2: 94% 99% 99% 93%  Weight: 137 lb 3.2 oz (62.2 kg)     Height:        Intake/Output Summary (Last 24 hours) at 08/23/16 1242 Last data filed at 08/23/16 1119  Gross per 24 hour  Intake           1178.6 ml  Output             1875 ml  Net           -696.4 ml   Filed Weights   08/21/16 0607 08/22/16 0540 08/23/16 0531  Weight: 137 lb 11.2 oz (62.5 kg) 138 lb 8 oz (62.8 kg) 137 lb 3.2 oz (62.2 kg)   PHYSICAL EXAM  General: anxious, NAD. Neuro: Alert  and oriented X 3. Moves all extremities spontaneously. Psych: Normal affect. HEENT:  Normal  Neck: Supple without bruits or JVD. Lungs:  Resp regular and unlabored, mild crackles B/L, poor air movement Heart: RRR no s3, s4, or murmurs. Abdomen: Soft, non-tender, non-distended, BS + x 4.  Extremities: No clubbing, cyanosis or edema. DP/PT/Radials 2+ and equal bilaterally.  Accessory Clinical Findings  CBC  Recent Labs  08/21/16 0224  WBC 4.9  HGB 9.8*  HCT 31.4*  MCV 80.7  PLT 123456   Basic Metabolic Panel  Recent Labs  08/21/16 0224 08/22/16 0513  NA 139 145  K 4.2 4.4  CL 101 99*  CO2 28 24  GLUCOSE 129* 91  BUN 26* 27*  CREATININE 1.18* 1.01*  CALCIUM 9.9 8.4*     Cardiac cath:  Prox LAD to Mid LAD lesion, 15 %stenosed.  Ost Cx to Prox Cx lesion, 15 %stenosed.  Prox RCA to Mid RCA lesion, 20 %stenosed.  There is mild  left ventricular systolic dysfunction.  LV end diastolic pressure is normal.  The left ventricular ejection fraction is 45-50% by visual estimate.   1. Minor nonobstructive CAD 2. Mild LV dysfunction with distal inferior HK 3. Measurement of LV pressures limited by frequent PVCs but EDP appears normal.    Pt profile:  75 y.o. year old female patient of Dr. 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 COPD- followed by Dr Ashok Cordia, (she was recently placed on steroids for a cough), GERD s/p esophageal dilatation May 2017, HTN with EF 45% grade 2 DD by echo 06/2016, HLD, hx bradycardia and PVCs. S-D-CHF, HTN, HLD.  Admitted on 8/23-8/29 for acute on chronic resp failure, acute diagnosed on chronic combined systolic and diastolic CHF (LVEF [id number], grade 2 DD), w/ afib (RVR) s/p DCCV>SR>> Atrial fib.  Admitted again on 9/6 - 9/7 after a syncope after Lasix increase for edema, afib RVR and CHF exacerbation. Weight at d/c 141. F/u scheduled for 09/15.   Pt admitted 09/13 w/  hemoptysis x 1, after dry cough, now completely resolved, CXR didnt show significant CHF, however crackles on physical exam, as well as ongoing wheezing. She has diuresed 5.2 L     ASSESSMENT AND PLAN   Acute on chronic combined systolic (congestive) and diastolic (congestive) heart failure ([place]) - she feels better after diuresis, continue with reduced lasix to 20mg  oral.   - BUN as high as 42 previous admit, Cr was 1.03 - follow closely, with diuresis and recent dye - left ventricular pressures are normal    Persistent atrial fibrillation ([place]) - afib may be contributing to CHF but she is under good rate control. I would try to continue rate control approach.  - however, with mildly reduced EF, antiarrhythmic options are limited. Roderic Palau in AFIB clinic has been following as well.  - HR generally controlled, despite respiratory problems. - changed Toprol XL to bedtime, to hopefully avoid post-med hypotension - CHA2DS2VASc=7 (HTN, CHF, age x 2, CVA x 2, female) - Back on Eliquis      COPD (chronic obstructive pulmonary disease) (HCC)  - prednisone 40 mg po x 3 days,   - albuterol/atrovent. Walking test and home O2 evaluation prior to the discharge.   Reassuring heart cath. [place] with DC from our perspective.  We discussed several questions. Salt, fluid restrictions, daily weights. She is very anxious. Wants a plan, we discussed. Reassuring heart cath. Failed cardioversion previously. Continue rate control. Will arrange follow up with Dr. Harrington Challenger or APP in our clinic.    Signed, Candee Furbish MD, [facility] 08/23/2016

## 2016-08-23 NOTE — Progress Notes (Signed)
Patient discharged home via wheel chair. Patient educated on medications and informed of follow up doctors appointments. Hermina Barters Rn 08/23/2016 6:37 PM

## 2016-08-23 NOTE — Discharge Summary (Signed)
Physician Discharge Summary  ZARIELLE GRUDEN [id number] DOB: 1941-06-01 DOA: 08/17/2016  PCP: Cathlean Cower, MD  Admit date: 08/17/2016 Discharge date: 08/23/2016  Time spent: > 35 minutes  Recommendations for Outpatient Follow-up:  1. Monitor Serum creatinine 2. Will d/c off prednisone as patient had no wheezes on day of d/c and had suspected CHF (prednisone can make CHF worse)   Discharge Diagnoses:  Principal Problem:   Acute on chronic combined systolic (congestive) and diastolic (congestive) heart failure (HCC) Active Problems:   PAF (paroxysmal atrial fibrillation) (HCC)   COPD (chronic obstructive pulmonary disease) (HCC)   CHF (congestive heart failure) (HCC)   Hemoptysis   Chronic atrial fibrillation (HCC)   Acute on chronic respiratory failure with hypoxia [facility])   Discharge Condition: stable  Diet recommendation: Heart healthy  Filed Weights   08/21/16 0607 08/22/16 0540 08/23/16 0531  Weight: 62.5 kg (137 lb 11.2 oz) 62.8 kg (138 lb 8 oz) 62.2 kg (137 lb 3.2 oz)    History of present illness:  75 y.o.with a history of chronic atrial fibrillation, COPD, combined systolic and diastolic heart failure. He presents with dyspnea and hemoptysis. CTA of her chest ruled out PE with evidence of pneumonitis versus pulmonary congestion and worsening emphysema. She was started on steroids and IV lasix with some improvement. Cardiology consulted and plan for cardiac cath.  Hospital Course:  1. Acute on chronic diastolic and systolic heart failure- patient started on IV Lasix, changed to po lasix 20 mg daily by cardiology. Net -3.8 L.  s/p cath 2. MIld acute COPD exacerbation- If patient had sob secondary to this then it has resolved. No wheezes as such will continue doxycycline for the 7 day treatment total. D/c prednisone as pt has no more wheezes and this can exacerbate chf. 3. Hemoptysis-resolved, likely secondary to pulmonary edema.   4. Acute kidney injury- baseline around 1.  Currently 1.01 with diuresis. Monitor after hospital d/c with pcp 5. Paroxysmal atrial fibrillation- continue Cardizem and toprol xl for rate control. eliquis for anticoagulation  Procedures:  S/p Cardiac Cath:   Prox LAD to Mid LAD lesion, 15 %stenosed.  Ost Cx to Prox Cx lesion, 15 %stenosed.  Prox RCA to Mid RCA lesion, 20 %stenosed.  There is mild left ventricular systolic dysfunction.  LV end diastolic pressure is normal.  The left ventricular ejection fraction is 45-50% by visual estimate.  1. Minor nonobstructive CAD 2. Mild LV dysfunction with distal inferior HK 3. Measurement of LV pressures limited by frequent PVCs but EDP appears normal.    Consultations:  Cardiology  Discharge Exam: Vitals:   08/23/16 0531 08/23/16 1135  BP: (!) 102/43 (!) 118/59  Pulse: 95 (!) 57  Resp: 18 16  Temp: 98.5 F (36.9 C) 98.4 F (36.9 C)    General: Pt in nad, alert and awake Cardiovascular: rrr, no rubs, no murmurs Respiratory: CTA BL, no wheezes, equal chest rise. Speaking in full sentences  Discharge Instructions   Discharge Instructions    Call MD for:  difficulty breathing, headache or visual disturbances    Complete by:  As directed    Call MD for:  extreme fatigue    Complete by:  As directed    Call MD for:  temperature >100.4    Complete by:  As directed    Diet - low sodium heart healthy    Complete by:  As directed    Discharge instructions    Complete by:  As directed    Please  be sure to follow up with your primary care physician in 1-2 weeks or sooner should any new concerns arise.   Increase activity slowly    Complete by:  As directed      Current Discharge Medication List    START taking these medications   Details  doxycycline (VIBRA-TABS) 100 MG tablet Take 1 tablet (100 mg total) by mouth every 12 (twelve) hours. Qty: 4 tablet, Refills: 0      CONTINUE these medications which have CHANGED   Details  metoprolol succinate (TOPROL XL)  25 MG 24 hr tablet Take 1 tablet (25 mg total) by mouth at bedtime. Qty: 30 tablet, Refills: 0      CONTINUE these medications which have NOT CHANGED   Details  albuterol (PROVENTIL HFA;VENTOLIN HFA) 108 (90 BASE) MCG/ACT inhaler Inhale 1-2 puffs into the lungs every 6 (six) hours as needed for wheezing or shortness of breath. Qty: 3 Inhaler, Refills: 4    apixaban (ELIQUIS) 5 MG TABS tablet Take 1 tablet (5 mg total) by mouth 2 (two) times daily. Qty: 60 tablet, Refills: 0    azelastine (ASTELIN) 0.1 % nasal spray Place 2 sprays into the nose daily. Use in each nostril as directed Qty: 90 mL, Refills: 1    benzonatate (TESSALON) 100 MG capsule Take 1 capsule (100 mg total) by mouth 3 (three) times daily as needed for cough. Qty: 90 capsule, Refills: 0    budesonide-formoterol (SYMBICORT) 160-4.5 MCG/ACT inhaler Inhale 2 puffs into the lungs 2 (two) times daily. Qty: 3 Inhaler, Refills: 4    Calcium Carbonate-Vitamin D (CALCIUM + D PO) Take 1 tablet by mouth 2 (two) times daily.     citalopram (CELEXA) 20 MG tablet Take 1 tablet (20 mg total) by mouth daily. Qty: 90 tablet, Refills: 3    cyclobenzaprine (FLEXERIL) 5 MG tablet Take 1 tablet (5 mg total) by mouth 3 (three) times daily as needed for muscle spasms. Qty: 270 tablet, Refills: 0    diltiazem (CARDIZEM CD) 300 MG 24 hr capsule Take 1 capsule (300 mg total) by mouth daily. Qty: 90 capsule, Refills: 0    ezetimibe (ZETIA) 10 MG tablet Take 1 tablet (10 mg total) by mouth daily. Qty: 90 tablet, Refills: 3    fluticasone (FLONASE) 50 MCG/ACT nasal spray Place 1 spray into both nostrils 2 (two) times daily. Qty: 48 g, Refills: 1    furosemide (LASIX) 20 MG tablet Take 1 tablet (20 mg total) by mouth daily. Qty: 90 tablet, Refills: 0    guaiFENesin (MUCINEX) 600 MG 12 hr tablet Take 1,200 mg by mouth 2 (two) times daily as needed for cough or to loosen phlegm.    Lidocaine HCl 2 % CREA Apply 1 application topically daily  as needed (for shingles flares).    LORazepam (ATIVAN) 0.5 MG tablet Take 1 tablet (0.5 mg total) by mouth 2 (two) times daily as needed for anxiety. Qty: 15 tablet, Refills: 0    Magnesium 250 MG TABS Take 1 tablet by mouth daily as needed. Leg cramps    Melatonin 1 MG TABS Take 3 mg by mouth at bedtime.     mirabegron ER (MYRBETRIQ) 50 MG TB24 tablet Take 50 mg by mouth daily.    montelukast (SINGULAIR) 10 MG tablet Take 10 mg by mouth daily.    Multiple Vitamin (MULTIVITAMIN) tablet Take 1 tablet by mouth daily.      omeprazole (PRILOSEC) 40 MG capsule Take 1 capsule (40 mg total) by  mouth daily. Qty: 90 capsule, Refills: 3   Associated Diagnoses: Dysphagia; Esophageal stricture; Gastroesophageal reflux disease with esophagitis    Respiratory Therapy Supplies (FLUTTER) DEVI Use 2-3 times per day Qty: 1 each, Refills: 0    Spacer/Aero-Holding Chambers (AEROCHAMBER Z-STAT PLUS) inhaler Use as instructed Qty: 1 each, Refills: 0    Tiotropium Bromide Monohydrate (SPIRIVA RESPIMAT) 2.5 MCG/ACT AERS Inhale 2 puffs into the lungs daily. Qty: 3 Inhaler, Refills: 3    vitamin E 400 UNIT capsule Take 400 Units by mouth daily.      STOP taking these medications     meloxicam (MOBIC) 15 MG tablet        Allergies  Allergen Reactions  . Clarithromycin Other (See Comments)    REACTION: possible rash but could have been legionarres dz with rash  . Lipitor [Atorvastatin] Other (See Comments)    MUSCLE SPASMS  . Simvastatin Other (See Comments)    Muscle and joint pain  . Crestor [Rosuvastatin Calcium]     fagitue and joint pain, "NO STATINS"      The results of significant diagnostics from this hospitalization (including imaging, microbiology, ancillary and laboratory) are listed below for reference.    Significant Diagnostic Studies: Dg Chest 2 View  Result Date: 08/17/2016 CLINICAL DATA:  Shortness of breath, chest heaviness and pain with weakness for the past week.  Bloody sputum today. History of COPD CVA, CHF. EXAM: CHEST  2 VIEW COMPARISON:  PA and lateral chest x-ray of August 10, 2016 FINDINGS: The lungs remain hyperinflated. The interstitial markings remain increased overall. There is no alveolar infiltrate or pleural effusion. The heart and pulmonary vascularity are normal. There is a hiatal hernia. There is calcification in the wall of the aortic arch. There is mild multilevel degenerative disc disease of the thoracic spine. IMPRESSION: COPD with chronic pulmonary fibrosis. No alveolar pneumonia nor CHF. Aortic atherosclerosis. Electronically Signed   By: David  Martinique M.D.   On: 08/17/2016 15:55   X-ray Chest Pa And Lateral  Result Date: 08/10/2016 CLINICAL DATA:  Syncopal episode EXAM: CHEST  2 VIEW COMPARISON:  08/10/2016 FINDINGS: Cardiac shadow is stable. Hiatal hernia is better appreciated on the current examination. No focal infiltrate or sizable effusion is seen. Hyperinflation is noted consistent with COPD. No acute bony abnormality is noted. Postsurgical changes in the lumbar spine are noted. IMPRESSION: No acute abnormality noted. Electronically Signed   By: Inez Catalina M.D.   On: 08/10/2016 19:53   Dg Chest 2 View  Result Date: 07/28/2016 CLINICAL DATA:  Congestive heart failure. Shortness of breath for 3 weeks EXAM: CHEST  2 VIEW COMPARISON:  July 27, 2016 FINDINGS: There remains generalized interstitial prominence without airspace consolidation. There are bilateral pleural effusions currently with cardiomegaly and pulmonary venous hypertension. No adenopathy. There is atherosclerotic calcification in the aorta. There is postoperative change in the upper lumbar spine. Bones appear osteoporotic. IMPRESSION: Evidence of a degree of congestive heart failure superimposed on interstitial fibrosis. No airspace consolidation. There is aortic atherosclerosis. Bones appear osteoporotic. Electronically Signed   By: Lowella Grip III M.D.   On:  07/28/2016 07:17   Ct Angio Chest Pe W And/or Wo Contrast  Result Date: 08/17/2016 CLINICAL DATA:  Hemoptysis. Increased shortness of breath with exertion. Cough for 1 week. EXAM: CT ANGIOGRAPHY CHEST WITH CONTRAST TECHNIQUE: Multidetector CT imaging of the chest was performed using the standard protocol during bolus administration of intravenous contrast. Multiplanar CT image reconstructions and MIPs were obtained to evaluate the  vascular anatomy. CONTRAST:  100 cc Isovue 370 IV COMPARISON:  Radiographs earlier this day.  Chest CT 12/19/2013 FINDINGS: Cardiovascular: There are no filling defects within the pulmonary arteries to suggest pulmonary embolus. The thoracic aorta is normal in caliber without dissection. There is moderate atherosclerosis without aneurysm. There is a conventional branching pattern from the aortic arch. Coronary artery calcifications are seen. Mediastinum/Nodes: Minimal ill-defined hilar soft tissue density likely lymphoid tissue, and discrete no wounds not seen. No mediastinal adenopathy. No pericardial effusion. Lungs/Pleura: Advanced emphysema, this is progressed from chest CT 2 and half years ago. Ill-defined ground-glass opacities throughout the right upper lobe superimposed on advanced emphysema, with minimal smooth septal thickening. Similar findings are seen in the left upper lobe and lingula to a lesser extent. There is central bronchial wall thickening. A 7 mm subpleural nodule in the right lower lobe image 110 series 407 is unchanged from 2015. No new nodule. No pulmonary mass or confluent airspace disease. Upper Abdomen: No acute abnormality. Moderate hiatal hernia. Contrast refluxes into the hepatic veins and IVC. Musculoskeletal: There are no acute or suspicious osseous abnormalities. Postsurgical change in the upper lumbar spine. Review of the MIP images confirms the above findings. IMPRESSION: 1. No pulmonary embolus. 2. Advanced emphysema, which has progressed over the  course of 2-1/2 years. There ill-defined patchy and ground-glass opacities in the right greater than left upper lobes superimposed on progressive emphysema, that may be infectious pneumonitis versus pulmonary edema. 3. Thoracic aortic atherosclerosis.  Coronary artery calcifications. Electronically Signed   By: Jeb Levering M.D.   On: 08/17/2016 21:18   Dg Chest Port 1 View  Result Date: 08/10/2016 CLINICAL DATA:  Syncopal episode EXAM: PORTABLE CHEST 1 VIEW COMPARISON:  07/28/2016 FINDINGS: Cardiac shadow is mildly enlarged in size. The lungs are well aerated bilaterally without focal infiltrate or sizable effusion. Previously seen bibasilar changes have resolved in the interval. No bony abnormality is seen. IMPRESSION: No active disease. Electronically Signed   By: Inez Catalina M.D.   On: 08/10/2016 13:45   Dg Chest Portable 1 View  Result Date: 07/27/2016 CLINICAL DATA:  Shortness of Breath EXAM: PORTABLE CHEST 1 VIEW COMPARISON:  May 25, 2016 and March 22, 2016 FINDINGS: There is generalized interstitial prominence, likely due to underlying pulmonary fibrotic type change. There is no frank edema or consolidation. Heart is upper normal in size with pulmonary vascularity indicating a mild degree of pulmonary venous hypertension. No adenopathy. Hiatal hernia evident. There is atherosclerotic calcification in the aorta. No bone lesions. IMPRESSION: Chronic interstitial prominence, likely due to chronic fibrotic type change. A degree of mild superimposed interstitial edema cannot be entirely excluded. There is evidence suggesting a degree of underlying pulmonary vascular congestion. No airspace consolidation. There is a hiatal hernia. There is aortic atherosclerosis. Electronically Signed   By: Lowella Grip III M.D.   On: 07/27/2016 10:25    Microbiology: No results found for this or any previous visit (from the past 240 hour(s)).   Labs: Basic Metabolic Panel:  Recent Labs Lab  08/18/16 0001 08/18/16 0532 08/19/16 0425 08/20/16 0502 08/21/16 0224 08/22/16 0513  NA  --  138 139 139 139 145  K  --  4.0 4.1 4.8 4.2 4.4  CL  --  104 101 102 101 99*  CO2  --  26 28 27 28 24   GLUCOSE  --  98 102* 114* 129* 91  BUN  --  13 17 24* 26* 27*  CREATININE  --  1.12*  1.12* 1.04* 1.18* 1.01*  CALCIUM  --  9.0 9.1 9.7 9.9 8.4*  MG 2.1  --   --   --   --   --    Liver Function Tests:  Recent Labs Lab 08/21/16 0224  AST 17  ALT 20  ALKPHOS 58  BILITOT 0.2*  PROT 5.6*  ALBUMIN 2.9*   No results for input(s): LIPASE, AMYLASE in the last 168 hours. No results for input(s): AMMONIA in the last 168 hours. CBC:  Recent Labs Lab 08/18/16 0001 08/18/16 0532 08/18/16 1216 08/19/16 0425 08/21/16 0224  WBC 5.1 4.3 4.6 4.3 4.9  HGB 10.7* 10.3* 11.0* 10.2* 9.8*  HCT 34.3* 32.6* 34.9* 31.9* 31.4*  MCV 80.3 80.5 80.0 79.6 80.7  PLT 215 199 235 222 292   Cardiac Enzymes:  Recent Labs Lab 08/17/16 1926 08/18/16 0001 08/18/16 0532 08/18/16 1216  TROPONINI <0.03 <0.03 <0.03 <0.03   BNP: BNP (last 3 results)  Recent Labs  07/27/16 0952 08/10/16 1336 08/17/16 2151  BNP 723.5* 447.4* 343.5*    ProBNP (last 3 results) No results for input(s): PROBNP in the last 8760 hours.  CBG: No results for input(s): GLUCAP in the last 168 hours.     Signed:  Velvet Bathe MD.  [facility] 08/23/2016, 4:45 PM

## 2016-08-24 ENCOUNTER — Encounter: Payer: Self-pay | Admitting: Pulmonary Disease

## 2016-08-24 ENCOUNTER — Ambulatory Visit (INDEPENDENT_AMBULATORY_CARE_PROVIDER_SITE_OTHER): Payer: Medicare Other | Admitting: Pulmonary Disease

## 2016-08-24 VITALS — BP 124/56 | HR 48 | Ht 64.0 in | Wt 140.0 lb

## 2016-08-24 DIAGNOSIS — K449 Diaphragmatic hernia without obstruction or gangrene: Secondary | ICD-10-CM

## 2016-08-24 DIAGNOSIS — K219 Gastro-esophageal reflux disease without esophagitis: Secondary | ICD-10-CM

## 2016-08-24 DIAGNOSIS — J309 Allergic rhinitis, unspecified: Secondary | ICD-10-CM | POA: Diagnosis not present

## 2016-08-24 DIAGNOSIS — J449 Chronic obstructive pulmonary disease, unspecified: Secondary | ICD-10-CM

## 2016-08-24 NOTE — Patient Instructions (Signed)
   Keep using your inhalers as prescribed.  We will do a longer walk test at your next appointment.  Call me if you have any new breathing problems.  We will keep your appointments as scheduled in October.

## 2016-08-24 NOTE — Progress Notes (Signed)
Subjective:    Patient ID: [name], female    DOB: Mar 27, 1941, 75 y.o.   MRN: [id number]  C.C.: Follow-up for Moderate COPD, Allergic Rhinitis, & GERD.  HPI  Moderate COPD:  Prescribed Symbicort & Spiriva. She reports compliance. She reports her dyspnea seems to fluctuate. She reports she has intermittent coughing that is productive of a "rust-colored" mucus. Denies any frank hemoptysis recently. She reports she is having some mild wheezing. She reports being compliant with her inhaler regimen. She reports she has been using her rescue inhaler less frequently recently.   Allergic Rhinitis:  On Singulair, Astelin, & Flonase. She reports her sinus congestion & pressure is under control. No post-nasal drainage.   GERD w/ Hiatal Hernia:  She is following with Dr. Kathrine Cords. Underwent EGD with dilation of esophagus on 04/14/16. On Prilosec. No reflux or dyspepsia. No morning brash water taste.   Review of Systems  She reports she still has some heart palpitations and chest "heaviness". She denies any fever, chills, or sweats. Denies any fever or chills. Does have night sweats. No nausea or emesis.  Allergies  Allergen Reactions  . Clarithromycin Other (See Comments)    REACTION: possible rash but could have been legionarres dz with rash  . Lipitor [Atorvastatin] Other (See Comments)    MUSCLE SPASMS  . Simvastatin Other (See Comments)    Muscle and joint pain  . Crestor [Rosuvastatin Calcium]     fagitue and joint pain, "NO STATINS"    Current Outpatient Prescriptions on File Prior to Visit  Medication Sig Dispense Refill  . albuterol (PROVENTIL HFA;VENTOLIN HFA) 108 (90 BASE) MCG/ACT inhaler Inhale 1-2 puffs into the lungs every 6 (six) hours as needed for wheezing or shortness of breath. 3 Inhaler 4  . apixaban (ELIQUIS) 5 MG TABS tablet Take 1 tablet (5 mg total) by mouth 2 (two) times daily. 60 tablet 0  . azelastine (ASTELIN) 0.1 % nasal spray Place 2 sprays into the nose daily.  Use in each nostril as directed (Patient taking differently: Place 2 sprays into both nostrils daily. ) 90 mL 1  . benzonatate (TESSALON) 100 MG capsule Take 1 capsule (100 mg total) by mouth 3 (three) times daily as needed for cough. 90 capsule 0  . budesonide-formoterol (SYMBICORT) 160-4.5 MCG/ACT inhaler Inhale 2 puffs into the lungs 2 (two) times daily. 3 Inhaler 4  . Calcium Carbonate-Vitamin D (CALCIUM + D PO) Take 1 tablet by mouth 2 (two) times daily.     . citalopram (CELEXA) 20 MG tablet Take 1 tablet (20 mg total) by mouth daily. 90 tablet 3  . cyclobenzaprine (FLEXERIL) 5 MG tablet Take 1 tablet (5 mg total) by mouth 3 (three) times daily as needed for muscle spasms. 270 tablet 0  . diltiazem (CARDIZEM CD) 300 MG 24 hr capsule Take 1 capsule (300 mg total) by mouth daily. 90 capsule 0  . doxycycline (VIBRA-TABS) 100 MG tablet Take 1 tablet (100 mg total) by mouth every 12 (twelve) hours. 4 tablet 0  . ezetimibe (ZETIA) 10 MG tablet Take 1 tablet (10 mg total) by mouth daily. 90 tablet 3  . fluticasone (FLONASE) 50 MCG/ACT nasal spray Place 1 spray into both nostrils 2 (two) times daily. 48 g 1  . furosemide (LASIX) 20 MG tablet Take 1 tablet (20 mg total) by mouth daily. (Patient taking differently: Take 20 mg by mouth every morning. ) 90 tablet 0  . guaiFENesin (MUCINEX) 600 MG 12 hr tablet Take 1,200  mg by mouth 2 (two) times daily as needed for cough or to loosen phlegm.    . Lidocaine HCl 2 % CREA Apply 1 application topically daily as needed (for shingles flares).    . LORazepam (ATIVAN) 0.5 MG tablet Take 1 tablet (0.5 mg total) by mouth 2 (two) times daily as needed for anxiety. 15 tablet 0  . Magnesium 250 MG TABS Take 1 tablet by mouth daily as needed. Leg cramps    . Melatonin 1 MG TABS Take 3 mg by mouth at bedtime.     . metoprolol succinate (TOPROL XL) 25 MG 24 hr tablet Take 1 tablet (25 mg total) by mouth at bedtime. 30 tablet 0  . mirabegron ER (MYRBETRIQ) 50 MG TB24  tablet Take 50 mg by mouth daily.    . montelukast (SINGULAIR) 10 MG tablet Take 10 mg by mouth daily.    . Multiple Vitamin (MULTIVITAMIN) tablet Take 1 tablet by mouth daily.      Marland Kitchen omeprazole (PRILOSEC) 40 MG capsule Take 1 capsule (40 mg total) by mouth daily. 90 capsule 3  . Respiratory Therapy Supplies (FLUTTER) DEVI Use 2-3 times per day 1 each 0  . Spacer/Aero-Holding Chambers (AEROCHAMBER Z-STAT PLUS) inhaler Use as instructed 1 each 0  . Tiotropium Bromide Monohydrate (SPIRIVA RESPIMAT) 2.5 MCG/ACT AERS Inhale 2 puffs into the lungs daily. 3 Inhaler 3  . vitamin E 400 UNIT capsule Take 400 Units by mouth daily.    . [DISCONTINUED] valsartan (DIOVAN) 160 MG tablet Take 1 tablet (160 mg total) by mouth daily. 90 tablet 3   No current facility-administered medications on file prior to visit.     Past Medical History:  Diagnosis Date  . Allergic rhinitis   . Allergy   . Anxiety   . Basal cell carcinoma   . Cataract    bil cataracts removed  . CHF (congestive heart failure) ([place])   . COPD (chronic obstructive pulmonary disease) ([place])   . Depression 11/29/2013  . DI (detrusor instability)   . Dysrhythmia    ATRIAL FIB  . Esophageal stricture   . GERD (gastroesophageal reflux disease)   . Hemorrhoids   . Hiatal hernia   . History of cerebrovascular accident   . History of kidney stones   . HLD (hyperlipidemia)   . HTN (hypertension)   . Nephrolithiasis    hx  . Osteoarthritis   . Pneumonia 2013  . Shingles since Nov 18, 2013   on right arm from shoulder to wrist  . Shortness of breath dyspnea    WITH EXERTION   . Status post dilation of esophageal narrowing   . Stroke [facility] 2001   STROKES, TIA'S  . TIA (transient ischemic attack) last 2001   anticoagulation therapy on coumadin    Past Surgical History:  Procedure Laterality Date  . ANTERIOR AND POSTERIOR VAGINAL REPAIR  2008   A&P REPAIR-DR MCDIARMID  . ANTERIOR LATERAL LUMBAR FUSION 4 LEVELS Right 07/03/2015     Procedure: Right Lumbar one-two, Lumbar two-three, Lumbar three-four, Lumbar four-five Anterior lateral lumbar interbody fusion;  Surgeon: Erline Levine, MD;  Location: [place] [facility];  Service: Neurosurgery;  Laterality: Right;  Right L1-2 L2-3 L3-4 L4-5 Anterior lateral lumbar interbody fusion  . BALLOON DILATION N/A 03/03/2014   Procedure: BALLOON DILATION;  Surgeon: Inda Castle, MD;  Location: [facility] ENDOSCOPY;  Service: Endoscopy;  Laterality: N/A;  . BLADDER SUSPENSION    . CARDIAC CATHETERIZATION N/A 08/22/2016   Procedure: Left Heart  Cath and Coronary Angiography;  Surgeon: Peter M Martinique, MD;  Location: [place] CV LAB;  Service: Cardiovascular;  Laterality: N/A;  . CARDIOVERSION N/A 08/01/2016   Procedure: CARDIOVERSION;  Surgeon: Jerline Pain, MD;  Location: [facility] ENDOSCOPY;  Service: Cardiovascular;  Laterality: N/A;  . COLONOSCOPY    . ESOPHAGOGASTRODUODENOSCOPY N/A 03/03/2014   Procedure: ESOPHAGOGASTRODUODENOSCOPY (EGD);  Surgeon: Inda Castle, MD;  Location: [facility];  Service: Endoscopy;  Laterality: N/A;  . EXCISION OF BASAL CELL CA  2011  . GLUTEUS MINIMUS REPAIR Right 03/15/2016   Procedure: RIGHT HIP GLUTEUS MEDIUS TENDON REPAIR;  Surgeon: Paralee Cancel, MD;  Location: [facility];  Service: Orthopedics;  Laterality: Right;  . LUMBAR PERCUTANEOUS PEDICLE SCREW 4 LEVEL Right 07/03/2015   Procedure: Lumbar one-Sacral one Bilateral percutaneous pedicle screws;  Surgeon: Erline Levine, MD;  Location: [place] [facility] OR[facility];  Service: Neurosurgery;  Laterality: Right;  L1-S1 Bilateral percutaneous pedicle screws  . RECTOCELE REPAIR  2008   A&P REPAIR -DR. MCDIARMID  . THUMB SURGERY Right 10-15 yrs ago  . UPPER GASTROINTESTINAL ENDOSCOPY    . VAGINAL HYSTERECTOMY  1983   NO BSO-DR. MABRY  . VARICOSE VEIN SURGERY      Family History  Problem Relation Age of Onset  . COPD Mother   . Breast cancer Mother     mid 78's  . Uterine cancer Mother   . Emphysema Mother   . Breast cancer  Maternal Aunt     x 3 aunts, post menopausal  . Colon cancer Neg Hx   . Esophageal cancer Neg Hx   . Rectal cancer Neg Hx   . Stomach cancer Neg Hx   . Pancreatic cancer Neg Hx     Social History   Social History  . Marital status: Married    Spouse name: N/A  . Number of children: 2  . Years of education: N/A   Occupational History  . retired Retired   Social History Main Topics  . Smoking status: Former Smoker    Packs/day: 3.00    Years: 42.00    Types: Cigarettes    Start date: 08/04/1954    Quit date: 12/06/1995  . Smokeless tobacco: Never Used  . Alcohol use 0.0 oz/week     Comment: occas very little  . Drug use: No  . Sexual activity: Not Currently    Birth control/ protection: Surgical   Other Topics Concern  . None   Social History Narrative   Retired- Dentist Pulmonary:   Originally from [place]. Has always lived in [place]. She has traveled to [place], [place], [place], [place], [place], [place]. Has been on multiple cruises. 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 She did use a face mask consistently to avoid chemical fume exposure. She may have only had an inhaled exposure a couple of times. She currently has a dog. Remote exposure to a parakeet. No mold or hot tub exposure. No asbestos exposure.       Objective:   Physical Exam BP (!) 124/56 (BP Location: Left Arm, Cuff Size: Normal)   Pulse (!) 48   Ht 5\' 4"  (1.626 m)   Wt 140 lb (63.5 kg)   LMP  (Exact Date)   SpO2 94%   BMI 24.03 kg/m  General:  Awake. Alert. Accompanied by husband. No acute distress. Integument:  Warm & dry. No rash on exposed skin.  Bruising of various ages on upper extremities. HEENT:  No scleral icterus. No notable nasal turbinate swelling. Moist mucous membranes. Cardiovascular:  Regular rate with occasional premature beat. No edema. Normal S1 & S2. Pulmonary:  Very minimal  upper lobe predominant and expiratory wheeze. Otherwise good aeration bilaterally. Normal work of breathing on room air. Musculoskeletal:  Normal bulk and tone. No joint deformity or effusion appreciated.  PFT 03/01/16: FVC 2.55 L (91%) FEV1 1.30 L (62%) FEV1/FVC 0.51 FEF 25-75 0.56 L (34%) no bronchodilator response TLC 5.66 L (111%) RV 116% ERV 133% DLCO uncorrected 62% (hemoglobin 15.1) 12/28/11:FVC 2.89 L (104%) FEV1 1.22 L (62%) FEV1/FVC 0.42 FEF 25-75 0.33 L (15%) no bronchodilator response TLC 6.19 L (129%) RV 163% ERV 114% DLCO uncorrected 69%  6MWT 08/24/16:  Walked 1 lap & was 96% at lowest on room air  IMAGING CTA CHEST 08/17/16 (personally reviewed by me):  No evidence of PE. No pleural effusion or thickening. No pathologic mediastinal adenopathy. Groundglass noted within right lung predominantly in the upper lung zone. Moderate upper lobe predominant emphysema which has progressed since 2015. 7 mm right lower lobe nodule has not changed since 2015. Patient has a 4 mm nodule within right middle lobe which appears new.  CXR PA/LAT 05/25/16 (preously reviewed by me): Hyperinflation with flattening of the diaphragms. No parenchymal opacity or mass appreciated. No pleural effusion. Heart normal in size & mediastinum normal in contour.  CXR PA/LAT 03/22/16 (previously reviewed by me):  Hyperinflation with elevation of left hemidiaphragm. No new focal opacity. Heart normal in size & mediastinum normal in contour.  CXR PA/LAT 11/09/15 (previously reviewed by me): No focal opacity or effusion appreciated. Heart normal in size. Mediastinum normal in contour. Mild hyperinflation with flattening of the diaphragms.   CARDIAC TTE (06/24/16): LVEF 45-50% with grade 2 diastolic dysfunction. LA moderately to severely dilated. RA normal in size. RV normal in size and function. No aortic stenosis or regurgitation. Moderate centrally directed mitral regurgitation. Paradoxical ventricular septal motion  consistent with intraventricular conduction delay. No pulmonic regurgitation. No tricuspid regurgitation. No pericardial effusion.  LABS 12/22/15 Alpha-1 antitrypsin: MM (143)    Assessment & Plan:  75 y.o. female with underlying moderate COPD. Patient recently hospitalized for acute renal failure and on chronic congestive heart failure. Continues to have an intermittent productive cough. I reviewed the patient's CT angiogram with her and her husband today which does show a questionable new right middle lobe nodule. Patient allergic rhinitis and reflux seem to be very well-controlled at this time and do not seem to be contributing to her cough. I do feel her cough is likely multifactorial and certainly attributable at least in part to her underlying congestive heart failure. She exhibited no sign of desaturation during her walk test today that would suggest the need for oxygen therapy but this will need to be repeated. I instructed the patient contact my office if she had any new breathing problems or questions before next appointment.  1. Moderate COPD: Continuing Symbicort & Spiriva as prescribed. Patient already scheduled for pulmonary function testing and 6 minute walk test at next appointment. 2. Right Middle Lobe Nodule: Seen on CT angiogram this month. Measures 4 mm. Plan for repeat CT imaging in 6 months. 3. Allergic Rhinitis: Continuing Singulair, Astelin, and Flonase. No changes. 4. GERD w/ Hiatal Hernia: Continuing Prilosec as prescribed. Followed by GI. 5. Health aintenance: S/P Prevnar December 2014 & Pneumovax January 2008. Had Influenza Vaccine September 2017. 6. Follow-up: Patient to  return to clinic in October as scheduled.  Sonia Baller Ashok Cordia, M.D. [facility] Pager:  [phone] After 3pm or if no response, call [phone] 1:00 PM 08/24/16

## 2016-08-25 ENCOUNTER — Telehealth: Payer: Self-pay | Admitting: Internal Medicine

## 2016-08-25 ENCOUNTER — Other Ambulatory Visit: Payer: Self-pay

## 2016-08-25 DIAGNOSIS — K55059 Acute (reversible) ischemia of intestine, part and extent unspecified: Secondary | ICD-10-CM | POA: Diagnosis not present

## 2016-08-25 DIAGNOSIS — R109 Unspecified abdominal pain: Secondary | ICD-10-CM | POA: Diagnosis not present

## 2016-08-25 NOTE — Telephone Encounter (Signed)
Patient Name: JAHNESSA SKERRITT DOB: 07-29-1941 Initial Comment Caller states she was released from hospital after CHF and COPD. She was given a flu shot before she left, now broken out all over. Nurse Assessment Nurse: Roosvelt Maser, RN, Barnetta Chapel Date/Time (Eastern Time): 08/25/2016 8:25:19 AM Confirm and document reason for call. If symptomatic, describe symptoms. You must click the next button to save text entered. ---caller states was released from hospital monday right before i left i got a flu shot and rash started tuesday all over and itchy. no fever Has the patient traveled out of the country within the last 30 days? ---Not Applicable Does the patient have any new or worsening symptoms? ---Yes Will a triage be completed? ---Yes Related visit to physician within the last 2 weeks? ---N/A Does the PT have any chronic conditions? (i.e. diabetes, asthma, etc.) ---Unknown Is this a behavioral health or substance abuse call? ---No Guidelines Guideline Title Affirmed Question Affirmed Notes Immunization Reactions Influenza (TIV; Injection) injected vaccine reactions (all triage questions negative) Final Disposition User [place], RN, Barnetta Chapel Disagree/Comply: Comply

## 2016-08-25 NOTE — Telephone Encounter (Signed)
New message     Pt calling complaining that she is having difficulty taking the Lasix. Please call.

## 2016-08-25 NOTE — Telephone Encounter (Signed)
Patient stated that she feels like she is going to pass out when she takes her Lasix. Patient has not taken her Lasix today. Patient reports her BP being 96/56 with a HR 100. Patient states she is on a fluid restriction, and yesterday after taking her Lasix and not being hydrated that she felt like she was going to pass out. Encouraged patient to hold her Lasix this morning, and will consult with the doctor to see about having some parameters for her Lasix.

## 2016-08-25 NOTE — Telephone Encounter (Signed)
This issue has already been addressed by Dr. Alan Ripper nurse, in previous note.

## 2016-08-25 NOTE — Patient Outreach (Signed)
Transition of care: Readmission that was discharged on 08/23/2016. Reports that she is doing well. Had follow up with pulmonary yesterday. Reports weight down 1 pound today and reports her blood pressure was low. States that she called MD office and was advised to hold Lasix for today.  Reports that she will call MD tomorrow and get instructions about taking Lasix.  Patient reports that she has a rash and was advised to take Benadryl. Patient reports that she is itching and she is concerned. States she might go to Urgent Care for rash.  Patient reports that she has all her medications and knows how to take them.   Offered rescheduling of home visit and patient had accepted.  Home visit planned for 09/01/2016.   Confirmed address.  PLAN: Will continue transition of care outreaches with home visit planned for 09/01/2016.  [facility] CM Care Plan Problem One   Flowsheet Row Most Recent Value  Care Plan Problem One  Recent admission for atrial fibrillation and heart failure  Role Documenting the Problem One  Care Management Coordinator  Care Plan for Problem One  Active  [facility] Long Term Goal (31-90 days)  Patient will report no readmissions in the next 31 days.  [facility] Long Term Goal Start Date  08/25/16 [date restarted due to readmission]  Interventions for Problem One Long Term Goal  Reviewed importance of low salt diet and reportsing symptoms to MD. patient to stay hydrated, call MD if needed and to change positions slowly.  [facility] CM Short Term Goal #1 (0-30 days)  Patient will report having a follow up appointment with primary MD in the next 2 weeks.  [facility] CM Short Term Goal #1 Start Date  08/04/16  [facility] CM Short Term Goal #1 Met Date  08/10/16  Interventions for Short Term Goal #1  Offered  to make appointment and patient declined. Discussed importance of timely follow up.  [facility] CM Short Term Goal #2 (0-30 days)  Patient will report weighing daily for the next 30 days.  [facility] CM Short Term Goal #2 Start Date   08/25/16 [date restarted due to readmission]  Interventions for Short Term Goal #2  Reviewed parameters to call MD.  [facility] CM Short Term Goal #3 (0-30 days)  Patient will follow her low salt diet for 30 days.  [facility]N CM Short Term Goal #3 Start Date  08/25/16 [date restarted due to readmission]  Interventions for Short Tern Goal #3  Reviewed importance of low salt diet and Encouraged patient to continue low salt diet.      Amanda Cook, RN, BSN, CEN [facility] Community Care Coordinator [phone]  

## 2016-08-25 NOTE — Telephone Encounter (Signed)
New message    Pt c/o BP issue: STAT if pt c/o blurred vision, one-sided weakness or slurred speech  1. What are your last 5 BP readings? On Tuesday got out of the hosp, Wed 115/53, thurs 92/60/96,  2. Are you having any other symptoms (ex. Dizziness, headache, blurred vision, passed out)? Feeling like she is going to pass out  3. What is your BP issue? weight is down a pd  Pt is afraid to take her Lasix and states she is not going to take it until she speaks with the nurse.

## 2016-08-25 NOTE — Telephone Encounter (Signed)
Spoke with patient. She told me that since finding out she has COPD and afib she has been hospitalized emergently once for taking too much lasix and passing out and once for too much fluid building up around her heart and was unable to breath.  Yesterday after her pulmonary appointment she was very thirsty and almost passed out several times, she got home and drank some extra water (she normally restricts to 1L per day).  After extra water she felt better.  Today she is worried about taking the lasix 20 mg tablet that is ordered once a day.  Her weight yesterday was 137.6, today 136.9.  She denies any swelling or SOB.  Does not feel like she is going to pass out.    Instructed patient for today to hold the lasix, unless later today she starts to feel like she is accumulating fluid or starts to get SOB or give out doing her normal activities then she should take.  She understands this and is in agreement.  Advised that she can call tomorrow to discuss how she is feeling before she takes a dose.    Had 2 wk f/u with APP on 09/09/16  Was discharged from hospital on 9/19----moved appointment up to 10/3.  Pt is very appreciative of this.plan.

## 2016-08-26 DIAGNOSIS — T508X5A Adverse effect of diagnostic agents, initial encounter: Secondary | ICD-10-CM | POA: Diagnosis not present

## 2016-08-26 DIAGNOSIS — F329 Major depressive disorder, single episode, unspecified: Secondary | ICD-10-CM | POA: Diagnosis not present

## 2016-08-26 DIAGNOSIS — E785 Hyperlipidemia, unspecified: Secondary | ICD-10-CM | POA: Diagnosis present

## 2016-08-26 DIAGNOSIS — R188 Other ascites: Secondary | ICD-10-CM | POA: Diagnosis not present

## 2016-08-26 DIAGNOSIS — E876 Hypokalemia: Secondary | ICD-10-CM | POA: Diagnosis not present

## 2016-08-26 DIAGNOSIS — K55059 Acute (reversible) ischemia of intestine, part and extent unspecified: Secondary | ICD-10-CM | POA: Diagnosis not present

## 2016-08-26 DIAGNOSIS — I481 Persistent atrial fibrillation: Secondary | ICD-10-CM | POA: Diagnosis not present

## 2016-08-26 DIAGNOSIS — R079 Chest pain, unspecified: Secondary | ICD-10-CM | POA: Diagnosis not present

## 2016-08-26 DIAGNOSIS — R262 Difficulty in walking, not elsewhere classified: Secondary | ICD-10-CM | POA: Diagnosis not present

## 2016-08-26 DIAGNOSIS — I509 Heart failure, unspecified: Secondary | ICD-10-CM | POA: Diagnosis present

## 2016-08-26 DIAGNOSIS — F418 Other specified anxiety disorders: Secondary | ICD-10-CM | POA: Diagnosis present

## 2016-08-26 DIAGNOSIS — R2689 Other abnormalities of gait and mobility: Secondary | ICD-10-CM | POA: Diagnosis not present

## 2016-08-26 DIAGNOSIS — K55039 Acute (reversible) ischemia of large intestine, extent unspecified: Secondary | ICD-10-CM | POA: Diagnosis present

## 2016-08-26 DIAGNOSIS — K55019 Acute (reversible) ischemia of small intestine, extent unspecified: Secondary | ICD-10-CM | POA: Diagnosis not present

## 2016-08-26 DIAGNOSIS — Z432 Encounter for attention to ileostomy: Secondary | ICD-10-CM | POA: Diagnosis not present

## 2016-08-26 DIAGNOSIS — Z741 Need for assistance with personal care: Secondary | ICD-10-CM | POA: Diagnosis not present

## 2016-08-26 DIAGNOSIS — Z7901 Long term (current) use of anticoagulants: Secondary | ICD-10-CM | POA: Diagnosis not present

## 2016-08-26 DIAGNOSIS — I4891 Unspecified atrial fibrillation: Secondary | ICD-10-CM | POA: Diagnosis not present

## 2016-08-26 DIAGNOSIS — I110 Hypertensive heart disease with heart failure: Secondary | ICD-10-CM | POA: Diagnosis not present

## 2016-08-26 DIAGNOSIS — K559 Vascular disorder of intestine, unspecified: Secondary | ICD-10-CM | POA: Diagnosis not present

## 2016-08-26 DIAGNOSIS — L270 Generalized skin eruption due to drugs and medicaments taken internally: Secondary | ICD-10-CM | POA: Diagnosis not present

## 2016-08-26 DIAGNOSIS — I482 Chronic atrial fibrillation: Secondary | ICD-10-CM | POA: Diagnosis not present

## 2016-08-26 DIAGNOSIS — Z87891 Personal history of nicotine dependence: Secondary | ICD-10-CM | POA: Diagnosis not present

## 2016-08-26 DIAGNOSIS — D649 Anemia, unspecified: Secondary | ICD-10-CM | POA: Diagnosis present

## 2016-08-26 DIAGNOSIS — R112 Nausea with vomiting, unspecified: Secondary | ICD-10-CM | POA: Diagnosis not present

## 2016-08-26 DIAGNOSIS — I10 Essential (primary) hypertension: Secondary | ICD-10-CM | POA: Diagnosis not present

## 2016-08-26 DIAGNOSIS — M6281 Muscle weakness (generalized): Secondary | ICD-10-CM | POA: Diagnosis not present

## 2016-08-26 DIAGNOSIS — R109 Unspecified abdominal pain: Secondary | ICD-10-CM | POA: Diagnosis not present

## 2016-08-26 DIAGNOSIS — D175 Benign lipomatous neoplasm of intra-abdominal organs: Secondary | ICD-10-CM | POA: Diagnosis not present

## 2016-08-26 DIAGNOSIS — K567 Ileus, unspecified: Secondary | ICD-10-CM | POA: Diagnosis not present

## 2016-08-26 DIAGNOSIS — K660 Peritoneal adhesions (postprocedural) (postinfection): Secondary | ICD-10-CM | POA: Diagnosis present

## 2016-08-26 DIAGNOSIS — D72829 Elevated white blood cell count, unspecified: Secondary | ICD-10-CM | POA: Diagnosis not present

## 2016-08-26 DIAGNOSIS — K6389 Other specified diseases of intestine: Secondary | ICD-10-CM | POA: Diagnosis present

## 2016-08-26 DIAGNOSIS — K9189 Other postprocedural complications and disorders of digestive system: Secondary | ICD-10-CM | POA: Diagnosis not present

## 2016-08-26 DIAGNOSIS — Z751 Person awaiting admission to adequate facility elsewhere: Secondary | ICD-10-CM | POA: Diagnosis not present

## 2016-08-26 DIAGNOSIS — J449 Chronic obstructive pulmonary disease, unspecified: Secondary | ICD-10-CM | POA: Diagnosis not present

## 2016-08-26 DIAGNOSIS — E7800 Pure hypercholesterolemia, unspecified: Secondary | ICD-10-CM | POA: Diagnosis not present

## 2016-08-26 DIAGNOSIS — J309 Allergic rhinitis, unspecified: Secondary | ICD-10-CM | POA: Diagnosis present

## 2016-08-26 DIAGNOSIS — J9811 Atelectasis: Secondary | ICD-10-CM | POA: Diagnosis not present

## 2016-08-26 DIAGNOSIS — Z79899 Other long term (current) drug therapy: Secondary | ICD-10-CM | POA: Diagnosis not present

## 2016-08-26 DIAGNOSIS — F17211 Nicotine dependence, cigarettes, in remission: Secondary | ICD-10-CM | POA: Diagnosis not present

## 2016-08-26 DIAGNOSIS — Z8673 Personal history of transient ischemic attack (TIA), and cerebral infarction without residual deficits: Secondary | ICD-10-CM | POA: Diagnosis not present

## 2016-08-26 DIAGNOSIS — R000 Tachycardia, unspecified: Secondary | ICD-10-CM | POA: Diagnosis not present

## 2016-08-29 ENCOUNTER — Other Ambulatory Visit: Payer: Self-pay

## 2016-08-29 NOTE — Patient Outreach (Signed)
Care coordination: Phone message from patients husband to report that patient had emergency surgery for an ischemic bowel on 08/26/2016 at [facility].  Husband reports that patient is in the ICU.   Husband request to cancel planned home visit this week.  PLAN: Home visit cancelled. Will notify MD. Also to note that patient has had 3 readmissions.  2 initial home visits have been cancelled due to readmissions.  30 day assessments have been unable to be completed at this time. Will continue to follow up with patient about planned discharge.  Tomasa Rand, RN, BSN, CEN [facility] [employer] [phone]

## 2016-08-30 ENCOUNTER — Encounter (HOSPITAL_COMMUNITY): Payer: Self-pay

## 2016-08-30 ENCOUNTER — Ambulatory Visit: Payer: Medicare Other | Admitting: Internal Medicine

## 2016-08-30 NOTE — Progress Notes (Signed)
Patient started her nuclear stress test on 08/17/16 but it could not be completed due to caffeine comsumption.  Patient completed the resting portion of the test and the stress portion was scheduled for 08/19/16.  Before the stress portion could be completed, the patient was hospitalized.  She ultimately had a heart catheterization on 08/22/16, and therefore the decision was made to cancel her nuclear stress test due to lack of necessity. Janifer Adie, CNMT, RT-N

## 2016-09-01 ENCOUNTER — Ambulatory Visit: Payer: Self-pay

## 2016-09-01 ENCOUNTER — Other Ambulatory Visit: Payer: Self-pay

## 2016-09-01 DIAGNOSIS — I4891 Unspecified atrial fibrillation: Secondary | ICD-10-CM

## 2016-09-01 DIAGNOSIS — K9189 Other postprocedural complications and disorders of digestive system: Secondary | ICD-10-CM

## 2016-09-01 DIAGNOSIS — K55059 Acute (reversible) ischemia of intestine, part and extent unspecified: Secondary | ICD-10-CM

## 2016-09-01 DIAGNOSIS — R112 Nausea with vomiting, unspecified: Secondary | ICD-10-CM

## 2016-09-01 DIAGNOSIS — D72829 Elevated white blood cell count, unspecified: Secondary | ICD-10-CM

## 2016-09-01 NOTE — Patient Outreach (Signed)
Care Coordination: Reviewed [facility] EMR and noted that patient remains admitted.    Plan: Will continue to follow. Tomasa Rand, RN, BSN, CEN [facility] [employer] [phone]

## 2016-09-02 ENCOUNTER — Other Ambulatory Visit: Payer: Self-pay

## 2016-09-02 NOTE — Patient Outreach (Signed)
Care Coordination: Patient remains admitted at [facility] per EMR. According to notes patient still has NG tube and is planning to be discharged to SNF.  PLAN: Will continue to follow.  Tomasa Rand, RN, BSN, CEN [facility] [employer] [phone]

## 2016-09-05 ENCOUNTER — Other Ambulatory Visit: Payer: Self-pay

## 2016-09-05 NOTE — Patient Outreach (Signed)
Case closure to nursing: [facility] EMR. Noted that patient remains admitted for now greater than 10 days and is planning to be discharged to SNF within a few days.    PLAN: placed order for social worker to follow and notify me when patient returns home.  Tomasa Rand, RN, BSN, CEN [facility] [employer] [phone]

## 2016-09-06 ENCOUNTER — Ambulatory Visit: Payer: Medicare Other | Admitting: Physician Assistant

## 2016-09-06 DIAGNOSIS — R630 Anorexia: Secondary | ICD-10-CM | POA: Diagnosis not present

## 2016-09-06 DIAGNOSIS — J438 Other emphysema: Secondary | ICD-10-CM | POA: Diagnosis not present

## 2016-09-06 DIAGNOSIS — Z741 Need for assistance with personal care: Secondary | ICD-10-CM | POA: Diagnosis not present

## 2016-09-06 DIAGNOSIS — I509 Heart failure, unspecified: Secondary | ICD-10-CM | POA: Diagnosis not present

## 2016-09-06 DIAGNOSIS — J449 Chronic obstructive pulmonary disease, unspecified: Secondary | ICD-10-CM | POA: Diagnosis not present

## 2016-09-06 DIAGNOSIS — K210 Gastro-esophageal reflux disease with esophagitis: Secondary | ICD-10-CM | POA: Diagnosis not present

## 2016-09-06 DIAGNOSIS — R262 Difficulty in walking, not elsewhere classified: Secondary | ICD-10-CM | POA: Diagnosis not present

## 2016-09-06 DIAGNOSIS — I482 Chronic atrial fibrillation: Secondary | ICD-10-CM | POA: Diagnosis not present

## 2016-09-06 DIAGNOSIS — K55059 Acute (reversible) ischemia of intestine, part and extent unspecified: Secondary | ICD-10-CM | POA: Diagnosis not present

## 2016-09-06 DIAGNOSIS — K559 Vascular disorder of intestine, unspecified: Secondary | ICD-10-CM | POA: Diagnosis not present

## 2016-09-06 DIAGNOSIS — F329 Major depressive disorder, single episode, unspecified: Secondary | ICD-10-CM | POA: Diagnosis not present

## 2016-09-06 DIAGNOSIS — R2689 Other abnormalities of gait and mobility: Secondary | ICD-10-CM | POA: Diagnosis not present

## 2016-09-06 DIAGNOSIS — R112 Nausea with vomiting, unspecified: Secondary | ICD-10-CM | POA: Diagnosis not present

## 2016-09-06 DIAGNOSIS — I10 Essential (primary) hypertension: Secondary | ICD-10-CM | POA: Diagnosis not present

## 2016-09-06 DIAGNOSIS — E7800 Pure hypercholesterolemia, unspecified: Secondary | ICD-10-CM | POA: Diagnosis not present

## 2016-09-06 DIAGNOSIS — R143 Flatulence: Secondary | ICD-10-CM | POA: Diagnosis not present

## 2016-09-06 DIAGNOSIS — K9189 Other postprocedural complications and disorders of digestive system: Secondary | ICD-10-CM | POA: Diagnosis not present

## 2016-09-06 DIAGNOSIS — I5040 Unspecified combined systolic (congestive) and diastolic (congestive) heart failure: Secondary | ICD-10-CM | POA: Diagnosis not present

## 2016-09-06 DIAGNOSIS — M6281 Muscle weakness (generalized): Secondary | ICD-10-CM | POA: Diagnosis not present

## 2016-09-06 DIAGNOSIS — Z432 Encounter for attention to ileostomy: Secondary | ICD-10-CM | POA: Diagnosis not present

## 2016-09-06 DIAGNOSIS — D72829 Elevated white blood cell count, unspecified: Secondary | ICD-10-CM | POA: Diagnosis not present

## 2016-09-06 DIAGNOSIS — I4891 Unspecified atrial fibrillation: Secondary | ICD-10-CM | POA: Diagnosis not present

## 2016-09-07 ENCOUNTER — Ambulatory Visit: Payer: Medicare Other | Admitting: Pulmonary Disease

## 2016-09-07 ENCOUNTER — Ambulatory Visit: Payer: Self-pay | Admitting: General Practice

## 2016-09-07 ENCOUNTER — Ambulatory Visit: Payer: Medicare Other

## 2016-09-07 DIAGNOSIS — K559 Vascular disorder of intestine, unspecified: Secondary | ICD-10-CM | POA: Diagnosis not present

## 2016-09-07 DIAGNOSIS — J449 Chronic obstructive pulmonary disease, unspecified: Secondary | ICD-10-CM | POA: Diagnosis not present

## 2016-09-07 DIAGNOSIS — Z8673 Personal history of transient ischemic attack (TIA), and cerebral infarction without residual deficits: Secondary | ICD-10-CM

## 2016-09-07 DIAGNOSIS — I10 Essential (primary) hypertension: Secondary | ICD-10-CM | POA: Diagnosis not present

## 2016-09-07 DIAGNOSIS — I482 Chronic atrial fibrillation: Secondary | ICD-10-CM | POA: Diagnosis not present

## 2016-09-08 DIAGNOSIS — I482 Chronic atrial fibrillation: Secondary | ICD-10-CM | POA: Diagnosis not present

## 2016-09-08 DIAGNOSIS — K210 Gastro-esophageal reflux disease with esophagitis: Secondary | ICD-10-CM | POA: Diagnosis not present

## 2016-09-08 DIAGNOSIS — J449 Chronic obstructive pulmonary disease, unspecified: Secondary | ICD-10-CM | POA: Diagnosis not present

## 2016-09-08 DIAGNOSIS — K559 Vascular disorder of intestine, unspecified: Secondary | ICD-10-CM | POA: Diagnosis not present

## 2016-09-08 DIAGNOSIS — I10 Essential (primary) hypertension: Secondary | ICD-10-CM | POA: Diagnosis not present

## 2016-09-09 ENCOUNTER — Ambulatory Visit: Payer: Medicare Other | Admitting: Physician Assistant

## 2016-09-09 DIAGNOSIS — R143 Flatulence: Secondary | ICD-10-CM | POA: Diagnosis not present

## 2016-09-09 DIAGNOSIS — I10 Essential (primary) hypertension: Secondary | ICD-10-CM | POA: Diagnosis not present

## 2016-09-09 DIAGNOSIS — R630 Anorexia: Secondary | ICD-10-CM | POA: Diagnosis not present

## 2016-09-09 DIAGNOSIS — J449 Chronic obstructive pulmonary disease, unspecified: Secondary | ICD-10-CM | POA: Diagnosis not present

## 2016-09-13 ENCOUNTER — Other Ambulatory Visit (INDEPENDENT_AMBULATORY_CARE_PROVIDER_SITE_OTHER): Payer: Medicare Other

## 2016-09-13 ENCOUNTER — Ambulatory Visit (INDEPENDENT_AMBULATORY_CARE_PROVIDER_SITE_OTHER): Payer: Medicare Other | Admitting: Internal Medicine

## 2016-09-13 VITALS — BP 134/72 | HR 56 | Resp 20 | Wt 130.1 lb

## 2016-09-13 DIAGNOSIS — J438 Other emphysema: Secondary | ICD-10-CM

## 2016-09-13 DIAGNOSIS — I4891 Unspecified atrial fibrillation: Secondary | ICD-10-CM

## 2016-09-13 DIAGNOSIS — I5040 Unspecified combined systolic (congestive) and diastolic (congestive) heart failure: Secondary | ICD-10-CM

## 2016-09-13 DIAGNOSIS — I10 Essential (primary) hypertension: Secondary | ICD-10-CM

## 2016-09-13 LAB — BASIC METABOLIC PANEL
BUN: 25 mg/dL — ABNORMAL HIGH (ref 6–23)
CO2: 25 mEq/L (ref 19–32)
Calcium: 9.6 mg/dL (ref 8.4–10.5)
Chloride: 106 mEq/L (ref 96–112)
Creatinine, Ser: 1.36 mg/dL — ABNORMAL HIGH (ref 0.40–1.20)
GFR: 40.06 mL/min — ABNORMAL LOW (ref >60.00)
Glucose, Bld: 91 mg/dL (ref 70–99)
Potassium: 4.5 mEq/L (ref 3.5–5.1)
Sodium: 141 mEq/L (ref 135–145)

## 2016-09-13 LAB — HEPATIC FUNCTION PANEL
ALT: 18 U/L (ref 0–35)
AST: 15 U/L (ref 0–37)
Albumin: 3.5 g/dL (ref 3.5–5.2)
Alkaline Phosphatase: 83 U/L (ref 39–117)
Bilirubin, Direct: 0 mg/dL (ref 0.0–0.3)
Total Bilirubin: 0.4 mg/dL (ref 0.2–1.2)
Total Protein: 6.6 g/dL (ref 6.0–8.3)

## 2016-09-13 LAB — CBC WITH DIFFERENTIAL/PLATELET
Basophils Absolute: 0.1 10*3/uL (ref 0.0–0.1)
Basophils Relative: 1.5 % (ref 0.0–3.0)
Eosinophils Absolute: 0.5 10*3/uL (ref 0.0–0.7)
Eosinophils Relative: 6.6 % — ABNORMAL HIGH (ref 0.0–5.0)
HCT: 32.9 % — ABNORMAL LOW (ref 36.0–46.0)
Hemoglobin: 10.8 g/dL — ABNORMAL LOW (ref 12.0–15.0)
Lymphocytes Relative: 29.2 % (ref 12.0–46.0)
Lymphs Abs: 2.4 10*3/uL (ref 0.7–4.0)
MCHC: 32.8 g/dL (ref 30.0–36.0)
MCV: 78 fl (ref 78.0–100.0)
Monocytes Absolute: 1.2 10*3/uL — ABNORMAL HIGH (ref 0.1–1.0)
Monocytes Relative: 14.5 % — ABNORMAL HIGH (ref 3.0–12.0)
Neutro Abs: 4 10*3/uL (ref 1.4–7.7)
Neutrophils Relative %: 48.2 % (ref 43.0–77.0)
Platelets: 281 10*3/uL (ref 150.0–400.0)
RBC: 4.22 Mil/uL (ref 3.87–5.11)
RDW: 19.9 % — ABNORMAL HIGH (ref 11.5–15.5)
WBC: 8.4 10*3/uL (ref 4.0–10.5)

## 2016-09-13 LAB — BRAIN NATRIURETIC PEPTIDE: Pro B Natriuretic peptide (BNP): 202 pg/mL — ABNORMAL HIGH (ref 0.0–100.0)

## 2016-09-13 LAB — TSH: TSH: 3.05 u[IU]/mL (ref 0.35–4.50)

## 2016-09-13 MED ORDER — METOPROLOL SUCCINATE ER 25 MG PO TB24: 50.0000 mg | ORAL_TABLET | Freq: Three times a day (TID) | ORAL | 0 refills | Status: DC

## 2016-09-13 NOTE — Patient Instructions (Signed)
Please continue all other medications as before, and refills have been done if requested.  Please have the pharmacy call with any other refills you may need.  Please continue your efforts at being more active, low cholesterol diet, and weight control..  Please keep your appointments with your specialists as you may have planned - Oct 12 with cardiology  You will be contacted regarding the referral for: cardiology to consider change of MD per your request  Please go to the LAB in the Basement (turn left off the elevator) for the tests to be done today  You will be contacted by phone if any changes need to be made immediately.  Otherwise, you will receive a letter about your results with an explanation, but please check with MyChart first.  Please remember to sign up for MyChart if you have not done so, as this will be important to you in the future with finding out test results, communicating by private email, and scheduling acute appointments online when needed.  Please return in 3 months, or sooner if needed

## 2016-09-13 NOTE — Progress Notes (Signed)
Pre visit review using our clinic review tool, if applicable. No additional management support is needed unless otherwise documented below in the visit note. 

## 2016-09-13 NOTE — Progress Notes (Signed)
Subjective:    Patient ID: [name], female    DOB: [birth year], 75 y.o.   MRN: [id number]  HPI  Here to f/u post [facility] to [facility], d/c to rehab, then 2 days later admitted to [facility] in [place] with ruptured colon, s/p partial colectomy, with current RLQ ileostomy, for staples out tomorrow, then plan for takedown planned for several wks. Per pt course complicated by rash attributed to cardizem which was stopped, and metoprolol increased to 50 tid.  Has fu appt with cardiology PA on oct 12 (which she will keep), but asks for change from current cardiology as only working part time and felt recent phone requests not handled well.  Finally released from rehab today for home, has [place] with RN and she thinks PT as well.  Pt denies chest pain, increased sob or doe, wheezing, orthopnea, PND, increased LE swelling, palpitations, or syncope, but has had some recurrent dizziness with BP 90's at rehab Wt Readings from Last 3 Encounters:  09/13/16 130 lb 2 oz (59 kg)  08/25/16 136 lb (61.7 kg)  08/24/16 140 lb (63.5 kg)   Past Medical History:  Diagnosis Date  . Allergic rhinitis   . Anxiety   . Atrial septal aneurysm   . Basal cell carcinoma   . Bradycardia   . Cataract    bil cataracts removed  . Chronic combined systolic and diastolic CHF (congestive heart failure) ([place])   . CKD (chronic kidney disease), stage III   . COPD (chronic obstructive pulmonary disease) ([place])   . Depression 11/29/2013  . DI (detrusor instability)   . Esophageal stricture   . GERD (gastroesophageal reflux disease)   . Hemorrhoids   . Hiatal hernia   . History of kidney stones   . HLD (hyperlipidemia)   . HTN (hypertension)   . Ischemic colon ([place])    a. 08/2016 - adm to  [facility] with a ruptured colon s/p right hemicolectomy with ileostomy formation for ischemic and necrotic colon.   . Lung nodule    a. followed by pulmonology  . Mild CAD    a. minor nonobstructive CAD 08/22/16.    . Nephrolithiasis    hx  . NICM (nonischemic cardiomyopathy) ([place])   . Orthostatic hypotension   . Osteoarthritis   . PAF (paroxysmal atrial fibrillation) ([place])    a. diagnosed 07/2016 s/p DCCV 08/01/16 with recurrence, managed with rate control for now.   . Pneumonia 2013  . PVC's (premature ventricular contractions)   . Shingles since Nov 18, 2013   on right arm from shoulder to wrist  . Status post dilation of esophageal narrowing   . Stroke [facility]) 2001   STROKES, TIA'S  . Syncope    a. 08/2016 felt due to orthostatic hypotension.  Marland Kitchen TIA (transient ischemic attack) last 2001   anticoagulation therapy on coumadin   Past Surgical History:  Procedure Laterality Date  . ANTERIOR AND POSTERIOR VAGINAL REPAIR  2008   A&P REPAIR-DR MCDIARMID  . ANTERIOR LATERAL LUMBAR FUSION 4 LEVELS Right 07/03/2015   Procedure: Right Lumbar one-two, Lumbar two-three, Lumbar three-four, Lumbar four-five Anterior lateral lumbar interbody fusion;  Surgeon: Erline Levine, MD;  Location: [place] NEURO ORS;  Service: Neurosurgery;  Laterality: Right;  Right L1-2 L2-3 L3-4 L4-5 Anterior lateral lumbar interbody fusion  . BALLOON DILATION N/A 03/03/2014   Procedure: BALLOON DILATION;  Surgeon: Inda Castle, MD;  Location: [facility] ENDOSCOPY;  Service: Endoscopy;  Laterality: N/A;  .  BLADDER SUSPENSION    . CARDIAC CATHETERIZATION N/A 08/22/2016   Procedure: Left Heart Cath and Coronary Angiography;  Surgeon: Peter M Martinique, MD;  Location: [place] CV LAB;  Service: Cardiovascular;  Laterality: N/A;  . CARDIOVERSION N/A 08/01/2016   Procedure: CARDIOVERSION;  Surgeon: Jerline Pain, MD;  [facility] ENDOSCOPY;  Service: Cardiovascular;  Laterality: N/A;  . COLONOSCOPY    . ESOPHAGOGASTRODUODENOSCOPY N/A 03/03/2014   Procedure: ESOPHAGOGASTRODUODENOSCOPY (EGD);  Surgeon: Inda Castle, MD;  Location: [facility] END[facility];  Service: Endoscopy;  Laterality: N/A;  . EXCISION OF BASAL CELL CA  2011  . GLUTEUS MINIMUS REPAIR Right  03/15/2016   Procedure: RIGHT HIP GLUTEUS MEDIUS TENDON REPAIR;  Surgeon: Paralee Cancel, MD;  Location: [facility] ORS;  Service: Orthopedics;  Laterality: Right;  . LUMBAR PERCUTANEOUS PEDICLE SCREW 4 LEVEL Right 07/03/2015   Procedure: Lumbar one-Sacral one Bilateral percutaneous pedicle screws;  Surgeon: Erline Levine, MD;  Location: [place] NEURO ORS;  Service: Neurosurgery;  Laterality: Right;  L1-S1 Bilateral percutaneous pedicle screws  . RECTOCELE REPAIR  2008   A&P REPAIR -DR. MCDIARMID  . THUMB SURGERY Right 10-15 yrs ago  . UPPER GASTROINTESTINAL ENDOSCOPY    . VAGINAL HYSTERECTOMY  1983   NO BSO-DR. MABRY  . VARICOSE VEIN SURGERY      reports that she quit smoking about 20 years ago. Her smoking use included Cigarettes. She started smoking about 62 years ago. She has a 126.00 pack-year smoking history. She has never used smokeless tobacco. She reports that she drinks alcohol. She reports that she does not use drugs. family history includes Breast cancer in her maternal aunt and mother; COPD in her mother; Emphysema in her mother; Uterine cancer in her mother. Allergies  Allergen Reactions  . Clarithromycin Other (See Comments)    REACTION: possible rash but could have been legionarres dz with rash  . Lipitor [Atorvastatin] Other (See Comments)    MUSCLE SPASMS  . Simvastatin Other (See Comments)    Muscle and joint pain  . Cardizem [Diltiazem Hcl] Hives  . Contrast Media [Iodinated Diagnostic Agents]     FYI - during admission at [facility] 08/2016 patient developed hives, question related to cardizem or contrast dye - not clear  . Crestor [Rosuvastatin Calcium]     fagitue and joint pain, "NO STATINS"   Current Outpatient Prescriptions on File Prior to Visit  Medication Sig Dispense Refill  . albuterol (PROVENTIL HFA;VENTOLIN HFA) 108 (90 BASE) MCG/ACT inhaler Inhale 1-2 puffs into the lungs every 6 (six) hours as needed for wheezing or shortness of breath. 3 Inhaler 4  . apixaban  (ELIQUIS) 5 MG TABS tablet Take 1 tablet (5 mg total) by mouth 2 (two) times daily. 60 tablet 0  . azelastine (ASTELIN) 0.1 % nasal spray Place 2 sprays into the nose daily. Use in each nostril as directed (Patient taking differently: Place 2 sprays into both nostrils daily. ) 90 mL 1  . benzonatate (TESSALON) 100 MG capsule Take 1 capsule (100 mg total) by mouth 3 (three) times daily as needed for cough. 90 capsule 0  . budesonide-formoterol (SYMBICORT) 160-4.5 MCG/ACT inhaler Inhale 2 puffs into the lungs 2 (two) times daily. 3 Inhaler 4  . Calcium Carbonate-Vitamin D (CALCIUM + D PO) Take 1 tablet by mouth 2 (two) times daily.     . citalopram (CELEXA) 20 MG tablet Take 1 tablet (20 mg total) by mouth daily. 90 tablet 3  . cyclobenzaprine (FLEXERIL) 5 MG tablet Take 1 tablet (  5 mg total) by mouth 3 (three) times daily as needed for muscle spasms. 270 tablet 0  . ezetimibe (ZETIA) 10 MG tablet Take 1 tablet (10 mg total) by mouth daily. 90 tablet 3  . fluticasone (FLONASE) 50 MCG/ACT nasal spray Place 1 spray into both nostrils 2 (two) times daily. 48 g 1  . guaiFENesin (MUCINEX) 600 MG 12 hr tablet Take 1,200 mg by mouth 2 (two) times daily as needed for cough or to loosen phlegm.    . Lidocaine HCl 2 % CREA Apply 1 application topically daily as needed (for shingles flares).    . LORazepam (ATIVAN) 0.5 MG tablet Take 1 tablet (0.5 mg total) by mouth 2 (two) times daily as needed for anxiety. 15 tablet 0  . Magnesium 250 MG TABS Take 1 tablet by mouth daily as needed. Leg cramps    . mirabegron ER (MYRBETRIQ) 50 MG TB24 tablet Take 50 mg by mouth daily.    . montelukast (SINGULAIR) 10 MG tablet Take 10 mg by mouth daily.    . Multiple Vitamin (MULTIVITAMIN) tablet Take 1 tablet by mouth daily.      Marland Kitchen omeprazole (PRILOSEC) 40 MG capsule Take 1 capsule (40 mg total) by mouth daily. 90 capsule 3  . Respiratory Therapy Supplies (FLUTTER) DEVI Use 2-3 times per day 1 each 0  . Spacer/Aero-Holding  Chambers (AEROCHAMBER Z-STAT PLUS) inhaler Use as instructed 1 each 0  . Tiotropium Bromide Monohydrate (SPIRIVA RESPIMAT) 2.5 MCG/ACT AERS Inhale 2 puffs into the lungs daily. 3 Inhaler 3  . vitamin E 400 UNIT capsule Take 400 Units by mouth daily.    . [DISCONTINUED] valsartan (DIOVAN) 160 MG tablet Take 1 tablet (160 mg total) by mouth daily. 90 tablet 3   No current facility-administered medications on file prior to visit.    Review of Systems  Constitutional: Negative for unusual diaphoresis or night sweats HENT: Negative for ear swelling or discharge Eyes: Negative for worsening visual haziness  Respiratory: Negative for choking and stridor.   Gastrointestinal: Negative for distension or worsening eructation Genitourinary: Negative for retention or change in urine volume.  Musculoskeletal: Negative for other MSK pain or swelling Skin: Negative for color change and worsening wound Neurological: Negative for tremors and numbness other than noted  Psychiatric/Behavioral: Negative for decreased concentration or agitation other than above       Objective:   Physical Exam BP 134/72   Pulse (!) 56   Resp 20   Wt 130 lb 2 oz (59 kg)   LMP  (Exact Date)   SpO2 93%   BMI 22.34 kg/m  VS noted,  Constitutional: Pt appears in no apparent distress HENT: Head: NCAT.  Right Ear: External ear normal.  Left Ear: External ear normal.  Eyes: . Pupils are equal, round, and reactive to light. Conjunctivae and EOM are normal Neck: Normal range of motion. Neck supple.  Cardiovascular: Normal rate and iregular rhythm.   Pulmonary/Chest: Effort normal and breath sounds decreased without rales or wheezing.  Abd:  Soft, NT, ND, + BS Neurological: Pt is alert. Not confused , motor grossly intact Skin: Skin is warm. No rash, no LE edema Psychiatric: Pt behavior is normal. No agitation. mild nervous    Assessment & Plan:

## 2016-09-14 ENCOUNTER — Other Ambulatory Visit: Payer: Self-pay

## 2016-09-14 DIAGNOSIS — Z09 Encounter for follow-up examination after completed treatment for conditions other than malignant neoplasm: Secondary | ICD-10-CM | POA: Insufficient documentation

## 2016-09-14 DIAGNOSIS — Z9981 Dependence on supplemental oxygen: Secondary | ICD-10-CM | POA: Insufficient documentation

## 2016-09-14 DIAGNOSIS — Z8673 Personal history of transient ischemic attack (TIA), and cerebral infarction without residual deficits: Secondary | ICD-10-CM | POA: Insufficient documentation

## 2016-09-14 NOTE — Patient Outreach (Signed)
Care coordination: 2 red emmi alerts on patient.   Unknown if patient has discharged from SNF. Awaiting confirmation from [place].  Placed call to patient at home and cell phone. No answer. Left a message.  Plan: Will continue to attempt to find out if patient is at home to begin Transition of care program.  Tomasa Rand, RN, BSN, [facility] Coordinator [phone]

## 2016-09-15 ENCOUNTER — Other Ambulatory Visit: Payer: Self-pay

## 2016-09-15 ENCOUNTER — Other Ambulatory Visit: Payer: Self-pay | Admitting: *Deleted

## 2016-09-15 NOTE — Patient Outreach (Signed)
Transition of care: Discharged from [facility] on 09/13/2016.  Patient reports to me that she is doing okay. States that she is very "Down" about her recent abdominal surgery. States that she has lost a lot of weight from 140 to 127. States that she is having difficulty eating.  States that if she drinks an ensure that it goes straight through her.  Reports that she has already followed up with her primary MD and is planning to see cardiology tomorrow. Reports that she continues to weigh daily and monitor her blood pressure. Encouraged patient to take her logs to her MD appointment tomorrow.  Patient reports that she has all her medications that were prescribed and that she will review her medications with the cardiologist tomorrow.  Patient reports that she is waiting for the home health nurse to come. Reports that her husband spoke with nurse. Reports home health was ordered by [facility].   PLAN: Reviewed transition of care program with patient Will call patient in 1 week. Scheduled home visit for 09/26/2016.  Encouraged patient to call me if needed sooner.   Will send MD barrier letter.  [facility] CM Care Plan Problem One   Flowsheet Row Most Recent Value  Care Plan Problem One  Recent admission for ileostomy and abdominal surgery.  Role Documenting the Problem One  Care Management [place] for Problem One  Active  [facility] Long Term Goal (31-90 days)  Patient will report no readmissions for the next 31 days.  [facility] Long Term Goal Start Date  09/15/16  Interventions for Problem One Long Term Goal  reviewed importance of follow diet per surgeon. Reviewed importance of calling MD for any concerns.  [facility] CM Short Term Goal #1 (0-30 days)  Patient will report feeling stronger and having improved weight gain in the next 30 days.  [facility] CM Short Term Goal #1 Start Date  09/15/16  Interventions for Short Term Goal #1  reviewed importance of eating frequent, small meals.  THN CM Short Term Goal  #2 (0-30 days)  Patient will report weighing daily for the next 30 days.  [facility] CM Short Term Goal #2 Start Date  09/15/16  Interventions for Short Term Goal #2  reviewed importance of daily weights, low slat diet and taking medications as prescribed.     Tomasa Rand, RN, BSN, CEN [facility] [employer] [phone]

## 2016-09-15 NOTE — Patient Outreach (Signed)
[facility]) Care Management  09/15/2016  DEBROAH ASHMAN May 29, 1941 [id number]   CSW was able to make contact with patient today- she reports that she is home and was released from [facility] rehab.  Patient's husband reports no concerns or needs related to CSW-  CSW will advise PCP,  [facility] RNCM  And [facility] team of CSW case closure.  Eduard Clos, MSW, [place] Worker  [place] [phone]

## 2016-09-16 ENCOUNTER — Encounter: Payer: Self-pay | Admitting: Physician Assistant

## 2016-09-16 ENCOUNTER — Ambulatory Visit (INDEPENDENT_AMBULATORY_CARE_PROVIDER_SITE_OTHER): Payer: Medicare Other | Admitting: Physician Assistant

## 2016-09-16 VITALS — BP 114/56 | HR 124 | Ht 64.0 in | Wt 128.1 lb

## 2016-09-16 DIAGNOSIS — I5042 Chronic combined systolic (congestive) and diastolic (congestive) heart failure: Secondary | ICD-10-CM

## 2016-09-16 DIAGNOSIS — I10 Essential (primary) hypertension: Secondary | ICD-10-CM | POA: Diagnosis not present

## 2016-09-16 DIAGNOSIS — I493 Ventricular premature depolarization: Secondary | ICD-10-CM

## 2016-09-16 DIAGNOSIS — I480 Paroxysmal atrial fibrillation: Secondary | ICD-10-CM

## 2016-09-16 LAB — CBC WITH DIFFERENTIAL/PLATELET
Basophils Absolute: 95 cells/uL (ref 0–200)
Basophils Relative: 1 %
Eosinophils Absolute: 1045 cells/uL — ABNORMAL HIGH (ref 15–500)
Eosinophils Relative: 11 %
HCT: 35.6 % (ref 35.0–45.0)
Hemoglobin: 11.1 g/dL — ABNORMAL LOW (ref 11.7–15.5)
Lymphocytes Relative: 41 %
Lymphs Abs: 3895 cells/uL (ref 850–3900)
MCH: 24.8 pg — ABNORMAL LOW (ref 27.0–33.0)
MCHC: 31.2 g/dL — ABNORMAL LOW (ref 32.0–36.0)
MCV: 79.6 fL — ABNORMAL LOW (ref 80.0–100.0)
MPV: 9.2 fL (ref 7.5–12.5)
Monocytes Absolute: 1045 cells/uL — ABNORMAL HIGH (ref 200–950)
Monocytes Relative: 11 %
Neutro Abs: 3420 cells/uL (ref 1500–7800)
Neutrophils Relative %: 36 %
Platelets: 346 10*3/uL (ref 140–400)
RBC: 4.47 MIL/uL (ref 3.80–5.10)
RDW: 19 % — ABNORMAL HIGH (ref 11.0–15.0)
WBC: 9.5 10*3/uL (ref 3.8–10.8)

## 2016-09-16 LAB — BASIC METABOLIC PANEL
BUN: 32 mg/dL — ABNORMAL HIGH (ref 7–25)
CO2: 20 mmol/L (ref 20–31)
Calcium: 9.3 mg/dL (ref 8.6–10.4)
Chloride: 105 mmol/L (ref 98–110)
Creat: 1.57 mg/dL — ABNORMAL HIGH (ref 0.60–0.93)
Glucose, Bld: 92 mg/dL (ref 65–99)
Potassium: 4.8 mmol/L (ref 3.5–5.3)
Sodium: 139 mmol/L (ref 135–146)

## 2016-09-16 LAB — MAGNESIUM: Magnesium: 1.7 mg/dL (ref 1.5–2.5)

## 2016-09-16 LAB — TSH: TSH: 2.72 mIU/L

## 2016-09-16 MED ORDER — METOPROLOL SUCCINATE ER 100 MG PO TB24: 100.0000 mg | ORAL_TABLET | Freq: Two times a day (BID) | ORAL | 6 refills | Status: DC

## 2016-09-16 MED ORDER — FUROSEMIDE 20 MG PO TABS
20.0000 mg | ORAL_TABLET | ORAL | 0 refills | Status: DC | PRN
Start: 2016-09-16 — End: 2017-08-14

## 2016-09-16 NOTE — Progress Notes (Signed)
Cardiology Office Note    Date:  09/16/2016  ID:  Tiea, Cheuvront 1941-10-05, MRN [id number] PCP:  Cathlean Cower, MD  Cardiologist:  Dr Harrington Challenger previously. Patient requests change to different cardiologist.   Chief Complaint: f/u afib  History of Present Illness:  KOPELYN DILLINGER is a 75 y.o. 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  She was referred to Dr Harrington Challenger in July 2017 after her pulmonologist noted slow HR and PVCs. A Holter monitor showed NSR, PVCs, and bigeminy but no atrial arrhythmias. Echo 06/24/16 showed her EF to be 45-50% with grade 2 DD, moderate to severe LA enlargement, and moderate MR with frequent PVCs noted. She was admitted 08/02/16 with acute resp failure/ hypoxia with newly diagnosed atrial fib RVR and CHF -> underwent DCCV with recurrence of afib. Toprol was added with plans for continued rate control and to consider antiarrhythmic for recurrent sx. She was readmitted 9/6-08/11/16 for syncope after Lasix was increased for edema with positive orthostatics. She was readmitted 9/13-9/19/17 with hemoptysis and dyspnea felt due to pulm edema/CHF. CTA of her chest ruled out PE with evidence of pneumonitis versus pulmonary congestion and worsening emphysema. She was started on steroids and BDs for COPD exacerbation. Underwent LHC showing minor nonobstructive CAD, EF 45-50%, measurement of LV pressures limited by frequent PVCs but EDP appeared normal. Per review of PCP & minimal Care Everywhere records, she was readmitted 2 days after discharge to [facility] with a  ruptured colon s/p right hemicolectomy with ileostomy formation for ischemic and necrotic colon. Anticoagulation had to be interrupted for the procedure. Plan is for takedown in several weeks. Per pt, course was complicated by rash - it was suspected this was due to Cardizem thus Toprol was increased to 50 tid. She says her team could not fully exclude contrast dye allergy, however.  She presents back to clinic today with her husband. She has felt "rough" ever since her colon surgery - is losing weight because she just can't eat. Weight is down to 128 (prev 140s). She gets full too quickly and feels bloated. She also feels nauseated. No chest pain, SOB, LEE, orthopnea. She has occasional awareness of palpitations but does not feel it right now even though her HR is 100-120s. Interestingly pulse ox and manual pulse check gives the feel of pulse in the 60s, even while EKG is showing atrial fib RVR - but suspect her frequent PVCs interspersed amongst the afib are creating the illusion of a controlled HR. She denies recurrent bleeding but says her surgeon wants her to see GI to consider upper GI given the coughing up of blood clots last month, in case it was a GI source instead.  Most recent labs 09/13/16 indicate Cr 1.36 (baseline appearing 1-1.1 recently), LFTs OK, Hgb 10.8 (prior range 11-12 in 2017), BNP 202, TSH wnl.   Past Medical History:  Diagnosis Date  . Allergic rhinitis   . Anxiety   . Atrial septal aneurysm   . Basal cell carcinoma   . Bradycardia   . Cataract    bil cataracts removed  . Chronic combined systolic and diastolic CHF (congestive heart  failure) ([place])   . CKD (chronic kidney disease), stage III   . COPD (chronic obstructive pulmonary disease) ([place])   . Depression 11/29/2013  . DI (detrusor instability)   . Esophageal stricture   . GERD (gastroesophageal reflux disease)   . Hemorrhoids   . Hiatal hernia   . History of kidney stones   . HLD (hyperlipidemia)   . HTN  (hypertension)   . Ischemic colon ([place])    a. 08/2016 - adm to  [facility] with a ruptured colon s/p right hemicolectomy with ileostomy formation for ischemic and necrotic colon.   . Mild CAD    a. minor nonobstructive CAD 08/22/16.  . Nephrolithiasis    hx  . NICM (nonischemic cardiomyopathy) ([place])   . Orthostatic hypotension   . Osteoarthritis   . PAF (paroxysmal atrial fibrillation) ([place])    a. diagnosed 07/2016 s/p DCCV 08/01/16 with recurrence, managed with rate control for now.   . Pneumonia 2013  . PVC's (premature ventricular contractions)   . Shingles since Nov 18, 2013   on right arm from shoulder to wrist  . Status post dilation of esophageal narrowing   . Stroke [facility]) 2001   STROKES, TIA'S  . Syncope    a. 08/2016 felt due to orthostatic hypotension.  Marland Kitchen TIA (transient ischemic attack) last 2001   anticoagulation therapy on coumadin    Past Surgical History:  Procedure Laterality Date  . ANTERIOR AND POSTERIOR VAGINAL REPAIR  2008   A&P REPAIR-DR MCDIARMID  . ANTERIOR LATERAL LUMBAR FUSION 4 LEVELS Right 07/03/2015   Procedure: Right Lumbar one-two, Lumbar two-three, Lumbar three-four, Lumbar four-five Anterior lateral lumbar interbody fusion;  Surgeon: Erline Levine, MD;  Location: [place] NEURO ORS;  Service: Neurosurgery;  Laterality: Right;  Right L1-2 L2-3 L3-4 L4-5 Anterior lateral lumbar interbody fusion  . BALLOON DILATION N/A 03/03/2014   Procedure: BALLOON DILATION;  Surgeon: Inda Castle, MD;  Location: [facility] ENDOSCOPY;  Service: Endoscopy;  Laterality: N/A;  . BLADDER SUSPENSION    . CARDIAC CATHETERIZATION N/A 08/22/2016   Procedure: Left Heart Cath and Coronary Angiography;  Surgeon: Peter M Martinique, MD;  Location: [place] CV LAB;  Service: Cardiovascular;  Laterality: N/A;  . CARDIOVERSION N/A 08/01/2016   Procedure: CARDIOVERSION;  Surgeon: Jerline Pain, MD;  Location: [facility] ENDOSCOPY;  Service: Cardiovascular;  Laterality: N/A;  . COLONOSCOPY    .  ESOPHAGOGASTRODUODENOSCOPY N/A 03/03/2014   Procedure: ESOPHAGOGASTRODUODENOSCOPY (EGD);  Surgeon: Inda Castle, MD;  Location: [facility] ENDOSCOPY;  Service: Endoscopy;  Laterality: N/A;  . EXCISION OF BASAL CELL CA  2011  . GLUTEUS MINIMUS REPAIR Right 03/15/2016   Procedure: RIGHT HIP GLUTEUS MEDIUS TENDON REPAIR;  Surgeon: Paralee Cancel, MD;  Location: [facility] ORS;  Service: Orthopedics;  Laterality: Right;  . LUMBAR PERCUTANEOUS PEDICLE SCREW 4 LEVEL Right 07/03/2015   Procedure: Lumbar one-Sacral one Bilateral percutaneous pedicle screws;  Surgeon: Erline Levine, MD;  Location: [place] NEURO ORS;  Service: Neurosurgery;  Laterality: Right;  L1-S1 Bilateral percutaneous pedicle screws  . RECTOCELE REPAIR  2008   A&P REPAIR -DR. MCDIARMID  . THUMB SURGERY Right 10-15 yrs ago  . UPPER GASTROINTESTINAL ENDOSCOPY    . VAGINAL HYSTERECTOMY  1983   NO BSO-DR. MABRY  . VARICOSE VEIN SURGERY      Current Medications: Current Outpatient Prescriptions  Medication Sig Dispense Refill  . albuterol (PROVENTIL HFA;VENTOLIN HFA) 108 (90 BASE) MCG/ACT inhaler Inhale 1-2 puffs into the lungs every 6 (six) hours as needed for wheezing  or shortness of breath. 3 Inhaler 4  . apixaban (ELIQUIS) 5 MG TABS tablet Take 1 tablet (5 mg total) by mouth 2 (two) times daily. 60 tablet 0  . azelastine (ASTELIN) 0.1 % nasal spray Place 2 sprays into the nose daily. Use in each nostril as directed (Patient taking differently: Place 2 sprays into both nostrils daily. ) 90 mL 1  . benzonatate (TESSALON) 100 MG capsule Take 1 capsule (100 mg total) by mouth 3 (three) times daily as needed for cough. 90 capsule 0  . budesonide-formoterol (SYMBICORT) 160-4.5 MCG/ACT inhaler Inhale 2 puffs into the lungs 2 (two) times daily. 3 Inhaler 4  . Calcium Carbonate-Vitamin D (CALCIUM + D PO) Take 1 tablet by mouth 2 (two) times daily.     . citalopram (CELEXA) 20 MG tablet Take 1 tablet (20 mg total) by mouth daily. 90 tablet 3  . cyclobenzaprine  (FLEXERIL) 5 MG tablet Take 1 tablet (5 mg total) by mouth 3 (three) times daily as needed for muscle spasms. 270 tablet 0  . ezetimibe (ZETIA) 10 MG tablet Take 1 tablet (10 mg total) by mouth daily. 90 tablet 3  . fluticasone (FLONASE) 50 MCG/ACT nasal spray Place 1 spray into both nostrils 2 (two) times daily. 48 g 1  . furosemide (LASIX) 20 MG tablet Take 1 tablet (20 mg total) by mouth daily. (Patient taking differently: Take 20 mg by mouth every morning. ) 90 tablet 0  . guaiFENesin (MUCINEX) 600 MG 12 hr tablet Take 1,200 mg by mouth 2 (two) times daily as needed for cough or to loosen phlegm.    . Lidocaine HCl 2 % CREA Apply 1 application topically daily as needed (for shingles flares).    . LORazepam (ATIVAN) 0.5 MG tablet Take 1 tablet (0.5 mg total) by mouth 2 (two) times daily as needed for anxiety. 15 tablet 0  . Magnesium 250 MG TABS Take 1 tablet by mouth daily as needed. Leg cramps    . Melatonin 5 MG TABS Take 5 mg by mouth at bedtime.    . metoprolol succinate (TOPROL-XL) 50 MG 24 hr tablet Take 50 mg by mouth 3 (three) times daily. Take with or immediately following a meal.    . mirabegron ER (MYRBETRIQ) 50 MG TB24 tablet Take 50 mg by mouth daily.    . montelukast (SINGULAIR) 10 MG tablet Take 10 mg by mouth daily.    . Multiple Vitamin (MULTIVITAMIN) tablet Take 1 tablet by mouth daily.      Marland Kitchen omeprazole (PRILOSEC) 40 MG capsule Take 1 capsule (40 mg total) by mouth daily. 90 capsule 3  . Respiratory Therapy Supplies (FLUTTER) DEVI Use 2-3 times per day 1 each 0  . Spacer/Aero-Holding Chambers (AEROCHAMBER Z-STAT PLUS) inhaler Use as instructed 1 each 0  . Tiotropium Bromide Monohydrate (SPIRIVA RESPIMAT) 2.5 MCG/ACT AERS Inhale 2 puffs into the lungs daily. 3 Inhaler 3  . vitamin E 400 UNIT capsule Take 400 Units by mouth daily.     No current facility-administered medications for this visit.      Allergies:   Clarithromycin; Lipitor [atorvastatin]; Simvastatin; Cardizem  [diltiazem hcl]; and Crestor [rosuvastatin calcium]   Social History   Social History  . Marital status: Married    Spouse name: N/A  . Number of children: 2  . Years of education: N/A   Occupational History  . retired Retired   Social History Main Topics  . Smoking status: Former Smoker    Packs/day: 3.00  Years: 42.00    Types: Cigarettes    Start date: 08/04/1954    Quit date: 12/06/1995  . Smokeless tobacco: Never Used  . Alcohol use 0.0 oz/week     Comment: occas very little  . Drug use: No  . Sexual activity: Not Currently    Birth control/ protection: Surgical   Other Topics Concern  . None   Social History Narrative   Retired- Dentist Pulmonary:   Originally from [place]. Has always lived in [place]. She has traveled to [place], [place], [place], [place], [place], [place]. Has been on multiple cruises. 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 She did use a face mask consistently to avoid chemical fume exposure. She may have only had an inhaled exposure a couple of times. She currently has a dog. Remote exposure to a parakeet. No mold or hot tub exposure. No asbestos exposure.      Family History:  The patient's family history includes Breast cancer in her maternal aunt and mother; COPD in her mother; Emphysema in her mother; Uterine cancer in her mother.  ROS:   Please see the history of present illness.  All other systems are reviewed and otherwise negative.    PHYSICAL EXAM:   VS:  BP (!) 114/56   Pulse (!) 124   Ht 5\' 4"  (1.626 m)   Wt 128 lb 1.9 oz (58.1 kg)   LMP  (Exact Date)   BMI 21.99 kg/m   BMI: Body mass index is 21.99 kg/m. GEN: Well nourished, well developed thin WF, in no acute distress  HEENT: normocephalic, atraumatic Neck: no JVD, carotid bruits, or masses Cardiac: Irregularly irregular, tachycardic; no murmurs, rubs, or gallops, no edema   Respiratory:  clear to auscultation bilaterally, normal work of breathing GI: soft, nontender, nondistended, + BS MS: no deformity or atrophy  Skin: warm and dry, no rash Neuro:  Alert and Oriented x 3, Strength and sensation are intact, follows commands Psych: euthymic mood, full affect  Wt Readings from Last 3 Encounters:  09/16/16 128 lb 1.9 oz (58.1 kg)  09/15/16 127 lb (57.6 kg)  09/13/16 130 lb 2 oz (59 kg)      Studies/Labs Reviewed:   EKG:  EKG was ordered today and personally reviewed by me and demonstrates atrial fib RVR frequent PVCs versus abberrancy, nonspecific ST-T changes  Recent Labs: 08/17/2016: B Natriuretic Peptide 343.5 08/18/2016: Magnesium 2.1 09/13/2016: ALT 18; BUN 25; Creatinine, Ser 1.36; Hemoglobin 10.8; Platelets 281.0; Potassium 4.5; Pro B Natriuretic peptide (BNP) 202.0; Sodium 141; TSH 3.05   Lipid Panel    Component Value Date/Time   CHOL 239 (H) 11/12/2015 1142   TRIG 122.0 11/12/2015 1142   HDL 49.70 11/12/2015 1142   CHOLHDL 5 11/12/2015 1142   VLDL 24.4 11/12/2015 1142   LDLCALC 165 (H) 11/12/2015 1142   LDLDIRECT 241.1 12/02/2014 1528    Additional studies/ records that were reviewed today include: Summarized above.    ASSESSMENT & PLAN:   1. Paroxysmal atrial fib - with RVR today. HR 105 sitting, 120s after activity. I reviewed the case with Dr. Saunders Revel (DOD) given her complexity. Will hold Lasix and increase metoprolol to 100mg  BID. Hesitant to add antiarrhythmic given interruption in anticoagulation for recent colon surgery. Per our discussion, will refer to EP ASAP for their input. This appointment will be on Wednesday 09/21/16. Will also update labs today to  exclude an acute change from baseline. She remains on anticoagulation without signs of recurrent bleeding. 2. Chronic combined CHF - actually appears on the dry side today. I have asked her to hold Lasix and only take this as needed for worsening SOB, weight gain 3-5lb above  baseline, or worsening lower extremity edema. 3. Essential HTN - BP running on softer side. Follow.  4. Frequent PVCs - as above. Cannot exclude abberrancy on today's EKG. Await EP input.  Disposition: F/u with Dr. Curt Bears next available appointment. She felt like Dr. Harrington Challenger did not have time for her and did not catch her issues - I went over her prior cardiac testing results with her and reassured her that Dr. Harrington Challenger is an excellent cardiologist but that we can honor her request. At her EP appointment will let Dr. Curt Bears determine if he wants to follow pt long term or if she needs a referral to a different general cardiologist given her request to switch.   Medication Adjustments/Labs and Tests Ordered: Current medicines are reviewed at length with the patient today.  Concerns regarding medicines are outlined above. Medication changes, Labs and Tests ordered today are summarized above and listed in the Patient Instructions accessible in Encounters.   Raechel Ache PA-C  09/16/2016 3:44 PM    [facility] [address], [place], [place]  [address] Phone: [phone]; Fax: [phone]

## 2016-09-16 NOTE — Patient Instructions (Addendum)
Medication Instructions:  Your physician has recommended you make the following change in your medication:  1-START Metoprolol 100 mg by mouth twice daily.  Labwork: Your physician recommends that you have lab work today. BMET CBC, TSH, and Mg  Testing/Procedures: NONE  Follow-Up: Your physician recommends that you schedule a follow-up appointment next week Wednesday, October, 18th at 10:00 a.m. with Dr. Curt Bears.  If you need a refill on your cardiac medications before your next appointment, please call your pharmacy.

## 2016-09-17 ENCOUNTER — Encounter: Payer: Self-pay | Admitting: Internal Medicine

## 2016-09-17 DIAGNOSIS — I482 Chronic atrial fibrillation, unspecified: Secondary | ICD-10-CM | POA: Insufficient documentation

## 2016-09-17 NOTE — Assessment & Plan Note (Signed)
stable overall by history and exam, recent data reviewed with pt, and pt to continue medical treatment as before,  to f/u any worsening symptoms or concerns Lab Results  Component Value Date   WBC 9.5 09/16/2016   HGB 11.1 (L) 09/16/2016   HCT 35.6 09/16/2016   PLT 346 09/16/2016   GLUCOSE 92 09/16/2016   CHOL 239 (H) 11/12/2015   TRIG 122.0 11/12/2015   HDL 49.70 11/12/2015   LDLDIRECT 241.1 12/02/2014   LDLCALC 165 (H) 11/12/2015   ALT 18 09/13/2016   AST 15 09/13/2016   NA 139 09/16/2016   K 4.8 09/16/2016   CL 105 09/16/2016   CREATININE 1.57 (H) 09/16/2016   BUN 32 (H) 09/16/2016   CO2 20 09/16/2016   TSH 2.72 09/16/2016   INR 1.21 08/17/2016   HGBA1C 5.9 11/12/2015

## 2016-09-17 NOTE — Assessment & Plan Note (Signed)
Stable rate and volume,  to f/u any worsening symptoms or concerns  

## 2016-09-17 NOTE — Assessment & Plan Note (Signed)
stable overall by history and exam, recent data reviewed with pt, and pt to continue medical treatment as before,  to f/u any worsening symptoms or concerns BP Readings from Last 3 Encounters:  09/16/16 (!) 114/56  09/13/16 134/72  08/24/16 (!) 124/56

## 2016-09-17 NOTE — Assessment & Plan Note (Signed)
stable overall by history and exam, recent data reviewed with pt, and pt to continue medical treatment as before,  to f/u any worsening symptoms or concerns @LASTSAO2(3)@  

## 2016-09-18 DIAGNOSIS — I110 Hypertensive heart disease with heart failure: Secondary | ICD-10-CM | POA: Diagnosis not present

## 2016-09-18 DIAGNOSIS — I4891 Unspecified atrial fibrillation: Secondary | ICD-10-CM | POA: Diagnosis not present

## 2016-09-18 DIAGNOSIS — J449 Chronic obstructive pulmonary disease, unspecified: Secondary | ICD-10-CM | POA: Diagnosis not present

## 2016-09-18 DIAGNOSIS — Z9181 History of falling: Secondary | ICD-10-CM | POA: Diagnosis not present

## 2016-09-18 DIAGNOSIS — Z87891 Personal history of nicotine dependence: Secondary | ICD-10-CM | POA: Diagnosis not present

## 2016-09-18 DIAGNOSIS — Z7901 Long term (current) use of anticoagulants: Secondary | ICD-10-CM | POA: Diagnosis not present

## 2016-09-18 DIAGNOSIS — Z48815 Encounter for surgical aftercare following surgery on the digestive system: Secondary | ICD-10-CM | POA: Diagnosis not present

## 2016-09-18 DIAGNOSIS — I509 Heart failure, unspecified: Secondary | ICD-10-CM | POA: Diagnosis not present

## 2016-09-18 DIAGNOSIS — Z432 Encounter for attention to ileostomy: Secondary | ICD-10-CM | POA: Diagnosis not present

## 2016-09-20 DIAGNOSIS — Z48815 Encounter for surgical aftercare following surgery on the digestive system: Secondary | ICD-10-CM | POA: Diagnosis not present

## 2016-09-20 DIAGNOSIS — Z9181 History of falling: Secondary | ICD-10-CM | POA: Diagnosis not present

## 2016-09-20 DIAGNOSIS — I110 Hypertensive heart disease with heart failure: Secondary | ICD-10-CM | POA: Diagnosis not present

## 2016-09-20 DIAGNOSIS — I4891 Unspecified atrial fibrillation: Secondary | ICD-10-CM | POA: Diagnosis not present

## 2016-09-20 DIAGNOSIS — J449 Chronic obstructive pulmonary disease, unspecified: Secondary | ICD-10-CM | POA: Diagnosis not present

## 2016-09-20 DIAGNOSIS — I509 Heart failure, unspecified: Secondary | ICD-10-CM | POA: Diagnosis not present

## 2016-09-20 MED ORDER — CITALOPRAM HYDROBROMIDE 40 MG PO TABS: 40.0000 mg | ORAL_TABLET | Freq: Every day | ORAL | 3 refills | Status: DC

## 2016-09-20 NOTE — Progress Notes (Signed)
Electrophysiology Office Note   Date:  09/21/2016   ID:  [name], [name] 08/13/1941, MRN [id number]  PCP:  Cathlean Cower, MD  Primary Electrophysiologist:  Alaney Witter Meredith Leeds, MD    Chief Complaint  Patient presents with  . New Patient (Initial Visit)    PVC's  . Dizziness    when standing up     History of Present Illness: [name] is a 75 y.o. female who presents today for electrophysiology evaluation.   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  She was referred to Dr Harrington Challenger in July 2017 after her pulmonologist noted slow HR and PVCs. A Holter monitor showed NSR, PVCs, and bigeminy but no atrial arrhythmias. Echo 06/24/16 showed her EF to be 45-50% with grade 2 DD, moderate to severe LA enlargement, and moderate MR with frequent PVCs noted. She was admitted 08/02/16 with acute resp failure/ hypoxia with newly diagnosed atrial fib RVR and CHF -> underwent DCCV with recurrence of afib. Toprol was added with plans for continued rate control and to consider antiarrhythmic for recurrent sx. She was readmitted 9/6-08/11/16 for syncope after Lasix was increased for edema with positive orthostatics. She was readmitted 9/13-9/19/17 with hemoptysis and dyspnea felt due to pulm edema/CHF. CTA of her chest ruled out PE with evidence of pneumonitis versus pulmonary congestion and worsening emphysema. She was started on steroids and BDs for COPD exacerbation. Underwent LHC showing minor nonobstructive CAD, EF 45-50%, measurement of LV pressures limited by frequent PVCs but EDP appeared normal. 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 Anticoagulation had to be interrupted for the procedure.   Today, she denies symptoms of palpitations, chest pain, shortness of breath, orthopnea, PND, lower extremity edema, claudication, dizziness, presyncope, syncope, bleeding, or neurologic sequela. The patient is tolerating medications without difficulties. She does have significant issues with fatigue and shortness of breath. Since she was diagnosed with atrial fibrillation, she says that she is able to do her daily chores, but does feel much worse at the end of the day. She does not think that it is due to her issues with her ileostomy, as it is healing well at this time.    Past Medical History:  Diagnosis Date  . Allergic rhinitis   . Anxiety   . Atrial septal aneurysm   . Basal cell carcinoma   . Bradycardia   . Cataract    bil cataracts removed  . Chronic combined systolic and diastolic CHF (congestive heart failure) ([place])   . CKD (chronic kidney disease), stage III   . COPD (chronic obstructive pulmonary disease) ([place])   . Depression 11/29/2013  . DI (detrusor instability)   . Esophageal stricture   . GERD (gastroesophageal reflux disease)   . Hemorrhoids   . Hiatal hernia   . History of kidney stones   . HLD (hyperlipidemia)   . HTN (hypertension)   . Ischemic colon ([place])    a. 08/2016 - adm to  [facility] with a ruptured colon s/p right hemicolectomy with ileostomy formation for ischemic and necrotic colon.   . Lung nodule    a. followed by  pulmonology  . Mild CAD    a. minor nonobstructive CAD 08/22/16.  . Nephrolithiasis    hx  . NICM (nonischemic cardiomyopathy) ([place])   . Orthostatic hypotension   . Osteoarthritis   . PAF (paroxysmal atrial fibrillation) ([place])    a. diagnosed 07/2016 s/p DCCV 08/01/16 with recurrence, managed with rate control for now.   . Pneumonia 2013  . PVC's (premature ventricular contractions)   . Shingles  since Nov 18, 2013   on right arm from shoulder to wrist  . Status post dilation of esophageal narrowing   . Stroke [facility]) 2001   STROKES, TIA'S  . Syncope    a. 08/2016 felt due to orthostatic hypotension.  Marland Kitchen TIA (transient ischemic attack) last 2001   anticoagulation therapy on coumadin   Past Surgical History:  Procedure Laterality Date  . ANTERIOR AND POSTERIOR VAGINAL REPAIR  2008   A&P REPAIR-DR MCDIARMID  . ANTERIOR LATERAL LUMBAR FUSION 4 LEVELS Right 07/03/2015   Procedure: Right Lumbar one-two, Lumbar two-three, Lumbar three-four, Lumbar four-five Anterior lateral lumbar interbody fusion;  Surgeon: Erline Levine, MD;  Location: [place] NEURO ORS;  Service: Neurosurgery;  Laterality: Right;  Right L1-2 L2-3 L3-4 L4-5 Anterior lateral lumbar interbody fusion  . BALLOON DILATION N/A 03/03/2014   Procedure: BALLOON DILATION;  Surgeon: Inda Castle, MD;  Location: [facility] ENDOSCOPY;  Service: Endoscopy;  Laterality: N/A;  . BLADDER SUSPENSION    . CARDIAC CATHETERIZATION N/A 08/22/2016   Procedure: Left Heart Cath and Coronary Angiography;  Surgeon: Peter M Martinique, MD;  Location: [place] CV LAB;  Service: Cardiovascular;  Laterality: N/A;  . CARDIOVERSION N/A 08/01/2016   Procedure: CARDIOVERSION;  Surgeon: Jerline Pain, MD;  Location[facility] ENDOSCOPY;  Service: Cardiovascular;  Laterality: N/A;  . COLONOSCOPY    . ESOPHAGOGASTRODUODENOSCOPY N/A 03/03/2014   Procedure: ESOPHAGOGASTRODUODENOSCOPY (EGD);  Surgeon: Inda Castle, MD;  Location: [facility];  Service: Endoscopy;  Laterality: N/A;  . EXCISION OF BASAL CELL CA  2011  . GLUTEUS MINIMUS REPAIR Right 03/15/2016   Procedure: RIGHT HIP GLUTEUS MEDIUS TENDON REPAIR;  Surgeon: Paralee Cancel, MD;  Location: [facility] ORS;  Service: Orthopedics;  Laterality: Right;  . LUMBAR PERCUTANEOUS PEDICLE SCREW 4 LEVEL Right 07/03/2015   Procedure: Lumbar one-Sacral one Bilateral percutaneous pedicle screws;  Surgeon: Erline Levine, MD;  Location: [place] NEURO  ORS;  Service: Neurosurgery;  Laterality: Right;  L1-S1 Bilateral percutaneous pedicle screws  . RECTOCELE REPAIR  2008   A&P REPAIR -DR. MCDIARMID  . THUMB SURGERY Right 10-15 yrs ago  . UPPER GASTROINTESTINAL ENDOSCOPY    . VAGINAL HYSTERECTOMY  1983   NO BSO-DR. MABRY  . VARICOSE VEIN SURGERY       Current Outpatient Prescriptions  Medication Sig Dispense Refill  . albuterol (PROVENTIL HFA;VENTOLIN HFA) 108 (90 BASE) MCG/ACT inhaler Inhale 1-2 puffs into the lungs every 6 (six) hours as needed for wheezing or shortness of breath. 3 Inhaler 4  . apixaban (ELIQUIS) 5 MG TABS tablet Take 1 tablet (5 mg total) by mouth 2 (two) times daily. 60 tablet 0  . azelastine (ASTELIN) 0.1 % nasal spray Place 2 sprays into the nose daily. Use in each nostril as directed (Patient taking differently: Place 2 sprays into both nostrils daily. ) 90 mL 1  . benzonatate (TESSALON) 100 MG capsule Take 1 capsule (100 mg total) by mouth 3 (three) times daily as needed for cough. 90 capsule 0  . budesonide-formoterol (SYMBICORT) 160-4.5 MCG/ACT inhaler Inhale 2 puffs into  the lungs 2 (two) times daily. 3 Inhaler 4  . Calcium Carbonate-Vitamin D (CALCIUM + D PO) Take 1 tablet by mouth 2 (two) times daily.     . citalopram (CELEXA) 40 MG tablet Take 1 tablet (40 mg total) by mouth daily. 90 tablet 3  . cyclobenzaprine (FLEXERIL) 5 MG tablet Take 1 tablet (5 mg total) by mouth 3 (three) times daily as needed for muscle spasms. 270 tablet 0  . ezetimibe (ZETIA) 10 MG tablet Take 1 tablet (10 mg total) by mouth daily. 90 tablet 3  . fluticasone (FLONASE) 50 MCG/ACT nasal spray Place 1 spray into both nostrils 2 (two) times daily. 48 g 1  . furosemide (LASIX) 20 MG tablet Take 1 tablet (20 mg total) by mouth as needed (swelling). 90 tablet 0  . guaiFENesin (MUCINEX) 600 MG 12 hr tablet Take 1,200 mg by mouth 2 (two) times daily as needed for cough or to loosen phlegm.    . Lidocaine HCl 2 % CREA Apply 1 application  topically daily as needed (for shingles flares).    . Melatonin 5 MG TABS Take 5 mg by mouth at bedtime.    . metoprolol succinate (TOPROL-XL) 100 MG 24 hr tablet Take 1 tablet (100 mg total) by mouth 2 (two) times daily. Take with or immediately following a meal. 60 tablet 6  . mirabegron ER (MYRBETRIQ) 50 MG TB24 tablet Take 50 mg by mouth daily.    . montelukast (SINGULAIR) 10 MG tablet Take 10 mg by mouth daily.    . Multiple Vitamin (MULTIVITAMIN) tablet Take 1 tablet by mouth daily.      Marland Kitchen omeprazole (PRILOSEC) 40 MG capsule Take 1 capsule (40 mg total) by mouth daily. 90 capsule 3  . Respiratory Therapy Supplies (FLUTTER) DEVI Use 2-3 times per day 1 each 0  . Spacer/Aero-Holding Chambers (AEROCHAMBER Z-STAT PLUS) inhaler Use as instructed 1 each 0  . Tiotropium Bromide Monohydrate (SPIRIVA RESPIMAT) 2.5 MCG/ACT AERS Inhale 2 puffs into the lungs daily. 3 Inhaler 3   No current facility-administered medications for this visit.     Allergies:   Clarithromycin; Lipitor [atorvastatin]; Simvastatin; Cardizem [diltiazem hcl]; Contrast media [iodinated diagnostic agents]; and Crestor [rosuvastatin calcium]   Social History:  The patient  reports that she quit smoking about 20 years ago. Her smoking use included Cigarettes. She started smoking about 62 years ago. She has a 126.00 pack-year smoking history. She has never used smokeless tobacco. She reports that she drinks alcohol. She reports that she does not use drugs.   Family History:  The patient's family history includes Breast cancer in her maternal aunt and mother; COPD in her mother; Emphysema in her mother; Uterine cancer in her mother.    ROS:  Please see the history of present illness.   Otherwise, review of systems is positive for none.   All other systems are reviewed and negative.    PHYSICAL EXAM: VS:  BP 90/62   Pulse 93   Ht 5\' 4"  (1.626 m)   Wt 130 lb (59 kg)   LMP  (Exact Date)   BMI 22.31 kg/m  , BMI Body mass index  is 22.31 kg/m. GEN: Well nourished, well developed, in no acute distress  HEENT: normal  Neck: no JVD, carotid bruits, or masses Cardiac: iRRR; no murmurs, rubs, or gallops,no edema  Respiratory:  clear to auscultation bilaterally, normal work of breathing GI: soft, nontender, nondistended, + BS MS: no deformity or atrophy  Skin: warm and  dry Neuro:  Strength and sensation are intact Psych: euthymic mood, full affect  EKG:  EKG is ordered today. Personal review of the ekg ordered shows atrial fibrillation, rate 93, septal infearct, PVCs  Recent Labs: 08/17/2016: B Natriuretic Peptide 343.5 09/13/2016: ALT 18; Pro B Natriuretic peptide (BNP) 202.0 09/16/2016: BUN 32; Creat 1.57; Hemoglobin 11.1; Magnesium 1.7; Platelets 346; Potassium 4.8; Sodium 139; TSH 2.72    Lipid Panel     Component Value Date/Time   CHOL 239 (H) 11/12/2015 1142   TRIG 122.0 11/12/2015 1142   HDL 49.70 11/12/2015 1142   CHOLHDL 5 11/12/2015 1142   VLDL 24.4 11/12/2015 1142   LDLCALC 165 (H) 11/12/2015 1142   LDLDIRECT 241.1 12/02/2014 1528     Wt Readings from Last 3 Encounters:  09/21/16 130 lb (59 kg)  09/16/16 128 lb 1.9 oz (58.1 kg)  09/15/16 127 lb (57.6 kg)      Other studies Reviewed: Additional studies/ records that were reviewed today include: TTE 06/24/16, Cath 08/22/16, Telemetry 06/14/16  Review of the above records today demonstrates:  TTE - Left ventricle: The cavity size was at the upper limits of   normal. Wall thickness was normal. Systolic function was mildly   reduced. The estimated ejection fraction was in the range of 45%   to 50%. Diffuse hypokinesis. Features are consistent with a   pseudonormal left ventricular filling pattern, with concomitant   abnormal relaxation and increased filling pressure (grade 2   diastolic dysfunction). - Ventricular septum: Septal motion showed paradox. These changes   are consistent with intraventricular conduction delay. - Mitral valve:  There was moderate regurgitation directed   centrally. - Left atrium: The atrium was moderately to severely dilated. - Atrial septum: The septum bowed from left to right, consistent   with increased left atrial pressure. No defect or patent foramen   ovale was identified.  Cath  Prox LAD to Mid LAD lesion, 15 %stenosed.  Ost Cx to Prox Cx lesion, 15 %stenosed.  Prox RCA to Mid RCA lesion, 20 %stenosed.  There is mild left ventricular systolic dysfunction.  LV end diastolic pressure is normal.  The left ventricular ejection fraction is 45-50% by visual estimate.   1. Minor nonobstructive CAD 2. Mild LV dysfunction with distal inferior HK 3. Measurement of LV pressures limited by frequent PVCs but EDP appears normal.   Telemetry 1. NSR with PVC's, at times in a bigeminal manner 2. Signal drop out as well as noise artifact are present.  3. No sustained atrial or ventricular arrhythmias 4. No pauses   ASSESSMENT AND PLAN:  1.  Paroxysmal atrial fibrillation: on Eliquis with Toprol XL for rate control. We'll start her on amiodarone today, as she has been compliant with her Eliquis. Due to her symptoms of fatigue and shortness of breath, we'll also plan for cardioversion. She has had one cardioversion in the past that kept her in normal rhythm for, according to her 8 hours.  This patients CHA2DS2-VASc Score and unadjusted Ischemic Stroke Rate (% per year) is equal to 7.2 % stroke rate/year from a score of 5  Above score calculated as 1 point each if present [CHF, HTN, DM, Vascular=MI/PAD/Aortic Plaque, Age if 65-74, or Female] Above score calculated as 2 points each if present [Age > 75, or Stroke/TIA/TE]  2. Chronic combined systolic HF: Currently well compensated on Toprol-XL. No ACE inhibitor due to low blood pressures.  3. HTN: Currently well controlled  4. PVCs: Plan to start amiodarone. If  PVCs continue she may benefit for 48 hour monitoring to further quantify her  number.     Current medicines are reviewed at length with the patient today.   The patient does not have concerns regarding her medicines.  The following changes were made today:  amiodarone  Labs/ tests ordered today include:  Orders Placed This Encounter  Procedures  . EKG 12-Lead     Disposition:   FU with Nazar Kuan 3 months  Signed, Bernal Luhman Meredith Leeds, MD  09/21/2016 11:06 AM     [facility] [address] [place] [address] [phone] (office) [phone] (fax)

## 2016-09-21 ENCOUNTER — Ambulatory Visit: Payer: Medicare Other | Admitting: Physician Assistant

## 2016-09-21 ENCOUNTER — Encounter: Payer: Self-pay | Admitting: *Deleted

## 2016-09-21 ENCOUNTER — Telehealth: Payer: Self-pay | Admitting: Internal Medicine

## 2016-09-21 ENCOUNTER — Encounter: Payer: Self-pay | Admitting: Cardiology

## 2016-09-21 ENCOUNTER — Ambulatory Visit (INDEPENDENT_AMBULATORY_CARE_PROVIDER_SITE_OTHER): Payer: Medicare Other | Admitting: Cardiology

## 2016-09-21 VITALS — BP 90/62 | HR 93 | Ht 64.0 in | Wt 130.0 lb

## 2016-09-21 DIAGNOSIS — I493 Ventricular premature depolarization: Secondary | ICD-10-CM | POA: Diagnosis not present

## 2016-09-21 DIAGNOSIS — I481 Persistent atrial fibrillation: Secondary | ICD-10-CM

## 2016-09-21 DIAGNOSIS — I4819 Other persistent atrial fibrillation: Secondary | ICD-10-CM

## 2016-09-21 DIAGNOSIS — Z01812 Encounter for preprocedural laboratory examination: Secondary | ICD-10-CM | POA: Diagnosis not present

## 2016-09-21 MED ORDER — BUPROPION HCL ER (XL) 150 MG PO TB24: 150.0000 mg | ORAL_TABLET | Freq: Every day | ORAL | 3 refills | Status: DC

## 2016-09-21 NOTE — Telephone Encounter (Signed)
Pt called in and said that she went and seen her cardiologist.  They are wanting to do some type of procedure on her and the med that she is on is not comparable with what they are wanting to do. Can you call her when you get a chance

## 2016-09-21 NOTE — Patient Instructions (Addendum)
Medication Instructions:   Your physician has recommended you make the following change in your medication:   1) Amiodarone  -  Do not start this medication until instructed.   - take 400 mg twice a day for 2 weeks, then   - take 400 mg once a day for 2 weeks, then   - take 200 mg once a day  Please call Dr. Jeneen Rinks and discuss switching to a different antidepressant than Citalopram  --- If you need a refill on your cardiac medications before your next appointment, please call your pharmacy. ---  Labwork:  None ordered  Testing/Procedures: Your physician has recommended that you have a Cardioversion (DCCV). Electrical Cardioversion uses a jolt of electricity to your heart either through paddles or wired patches attached to your chest. This is a controlled, usually prescheduled, procedure. Defibrillation is done under light anesthesia in the hospital, and you usually go home the day of the procedure. This is done to get your heart back into a normal rhythm. You are not awake for the procedure.   Lauren Steig, RN will call you to arrange this procedure.  Follow-Up:  To be determined once cardioversion has been scheduled..   Thank you for choosing [facility]!!   Lauren Curet, RN [phone]    Any Other Special Instructions Will Be Listed Below (If Applicable).  Amiodarone tablets What is this medicine? AMIODARONE (a MEE oh da rone) is an antiarrhythmic drug. It helps make your heart beat regularly. Because of the side effects caused by this medicine, it is only used when other medicines have not worked. It is usually used for heartbeat problems that may be life threatening. This medicine may be used for other purposes; ask your health care provider or pharmacist if you have questions. What should I tell my health care provider before I take this medicine? They need to know if you have any of these conditions: -liver disease -lung disease -other heart problems -thyroid  disease -an unusual or allergic reaction to amiodarone, iodine, other medicines, foods, dyes, or preservatives -pregnant or trying to get pregnant -breast-feeding How should I use this medicine? Take this medicine by mouth with a glass of water. Follow the directions on the prescription label. You can take this medicine with or without food. However, you should always take it the same way each time. Take your doses at regular intervals. Do not take your medicine more often than directed. Do not stop taking except on the advice of your doctor or health care professional. A special MedGuide will be given to you by the pharmacist with each prescription and refill. Be sure to read this information carefully each time. Talk to your pediatrician regarding the use of this medicine in children. Special care may be needed. Overdosage: If you think you have taken too much of this medicine contact a poison control center or emergency room at once. NOTE: This medicine is only for you. Do not share this medicine with others. What if I miss a dose? If you miss a dose, take it as soon as you can. If it is almost time for your next dose, take only that dose. Do not take double or extra doses. What may interact with this medicine? Do not take this medicine with any of the following medications: -abarelix -apomorphine -arsenic trioxide -certain antibiotics like erythromycin, gemifloxacin, levofloxacin, pentamidine -certain medicines for depression like amoxapine, tricyclic antidepressants -certain medicines for fungal infections like fluconazole, itraconazole, ketoconazole, posaconazole, voriconazole -certain medicines for irregular  heart beat like disopyramide, dofetilide, dronedarone, ibutilide, propafenone, sotalol -certain medicines for malaria like chloroquine, halofantrine -cisapride -droperidol -haloperidol -hawthorn -maprotiline -methadone -phenothiazines like chlorpromazine, mesoridazine,  thioridazine -pimozide -ranolazine -red yeast rice -vardenafil -ziprasidone This medicine may also interact with the following medications: -antiviral medicines for HIV or AIDS -certain medicines for blood pressure, heart disease, irregular heart beat -certain medicines for cholesterol like atorvastatin, cerivastatin, lovastatin, simvastatin -certain medicines for hepatitis C like sofosbuvir and ledipasvir; sofosbuvir -certain medicines for seizures like phenytoin -certain medicines for thyroid problems -certain medicines that treat or prevent blood clots like warfarin -cholestyramine -cimetidine -clopidogrel -cyclosporine -dextromethorphan -diuretics -fentanyl -general anesthetics -grapefruit juice -lidocaine -loratadine -methotrexate -other medicines that prolong the QT interval (cause an abnormal heart rhythm) -procainamide -quinidine -rifabutin, rifampin, or rifapentine -St. John's Wort -trazodone This list may not describe all possible interactions. Give your health care provider a list of all the medicines, herbs, non-prescription drugs, or dietary supplements you use. Also tell them if you smoke, drink alcohol, or use illegal drugs. Some items may interact with your medicine. What should I watch for while using this medicine? Your condition will be monitored closely when you first begin therapy. Often, this drug is first started in a hospital or other monitored health care setting. Once you are on maintenance therapy, visit your doctor or health care professional for regular checks on your progress. Because your condition and use of this medicine carry some risk, it is a good idea to carry an identification card, necklace or bracelet with details of your condition, medications, and doctor or health care professional. [name] may get drowsy or dizzy. Do not drive, use machinery, or do anything that needs mental alertness until you know how this medicine affects you. Do not stand  or sit up quickly, especially if you are an older patient. This reduces the risk of dizzy or fainting spells. This medicine can make you more sensitive to the sun. Keep out of the sun. If you cannot avoid being in the sun, wear protective clothing and use sunscreen. Do not use sun lamps or tanning beds/booths. You should have regular eye exams before and during treatment. Call your doctor if you have blurred vision, see halos, or your eyes become sensitive to light. Your eyes may get dry. It may be helpful to use a lubricating eye solution or artificial tears solution. If you are going to have surgery or a procedure that requires contrast dyes, tell your doctor or health care professional that you are taking this medicine. What side effects may I notice from receiving this medicine? Side effects that you should report to your doctor or health care professional as soon as possible: -allergic reactions like skin rash, itching or hives, swelling of the face, lips, or tongue -blue-gray coloring of the skin -blurred vision, seeing blue green halos, increased sensitivity of the eyes to light -breathing problems -chest pain -dark urine -fast, irregular heartbeat -feeling faint or light-headed -intolerance to heat or cold -nausea or vomiting -pain and swelling of the scrotum -pain, tingling, numbness in feet, hands -redness, blistering, peeling or loosening of the skin, including inside the mouth -spitting up blood -stomach pain -sweating -unusual or uncontrolled movements of body -unusually weak or tired -weight gain or loss -yellowing of the eyes or skin Side effects that usually do not require medical attention (report to your doctor or health care professional if they continue or are bothersome): -change in sex drive or performance -constipation -dizziness -headache -loss of appetite -trouble sleeping  This list may not describe all possible side effects. Call your doctor for medical  advice about side effects. You may report side effects to FDA at 1-800-FDA-1088. Where should I keep my medicine? Keep out of the reach of children. Store at room temperature between 20 and 25 degrees C (68 and 77 degrees F). Protect from light. Keep container tightly closed. Throw away any unused medicine after the expiration date. NOTE: This sheet is a summary. It may not cover all possible information. If you have questions about this medicine, talk to your doctor, pharmacist, or health care provider.    2016, Elsevier/Gold Standard. (2014-02-24 19:48:11)   Electrical Cardioversion Electrical cardioversion is the delivery of a jolt of electricity to change the rhythm of the heart. Sticky patches or metal paddles are placed on the chest to deliver the electricity from a device. This is done to restore a normal rhythm. A rhythm that is too fast or not regular keeps the heart from pumping well. Electrical cardioversion is done in an emergency if:   There is low or no blood pressure as a result of the heart rhythm.   Normal rhythm must be restored as fast as possible to protect the brain and heart from further damage.   It may save a life. Cardioversion may be done for heart rhythms that are not immediately life threatening, such as atrial fibrillation or flutter, in which:   The heart is beating too fast or is not regular.   Medicine to change the rhythm has not worked.   It is safe to wait in order to allow time for preparation.  Symptoms of the abnormal rhythm are bothersome.  The risk of stroke and other serious problems can be reduced. LET [facility] CARE PROVIDER KNOW ABOUT:   Any allergies you have.  All medicines you are taking, including vitamins, herbs, eye drops, creams, and over-the-counter medicines.  Previous problems you or members of your family have had with the use of anesthetics.   Any blood disorders you have.   Previous surgeries you have had.    Medical conditions you have. RISKS AND COMPLICATIONS  Generally, this is a safe procedure. However, problems can occur and include:   Breathing problems related to the anesthetic used.  A blood clot that breaks free and travels to other parts of your body. This could cause a stroke or other problems. The risk of this is lowered by use of blood-thinning medicine (anticoagulant) prior to the procedure.  Cardiac arrest (rare). BEFORE THE PROCEDURE   You may have tests to detect blood clots in your heart and to evaluate heart function.  You may start taking anticoagulants so your blood does not clot as easily.   Medicines may be given to help stabilize your heart rate and rhythm. PROCEDURE  You will be given medicine through an IV tube to reduce discomfort and make you sleepy (sedative).   An electrical shock will be delivered. AFTER THE PROCEDURE Your heart rhythm will be watched to make sure it does not change.    This information is not intended to replace advice given to you by your health care provider. Make sure you discuss any questions you have with your health care provider.   Document Released: 11/11/2002 Document Revised: 12/12/2014 Document Reviewed: 06/05/2013 Elsevier Interactive Patient Education [employer].

## 2016-09-21 NOTE — Addendum Note (Signed)
Addended by: Biagio Borg on: 09/21/2016 06:58 PM   Modules accepted: Orders

## 2016-09-21 NOTE — Telephone Encounter (Signed)
Please advise patient is having a procedure done by her cardiologist and the medication she is on (citalopram) is not okay for her to be taking while having the procedure. Is there something else you can prescribe her.

## 2016-09-22 ENCOUNTER — Ambulatory Visit: Payer: Self-pay

## 2016-09-22 ENCOUNTER — Telehealth: Payer: Self-pay | Admitting: *Deleted

## 2016-09-22 DIAGNOSIS — R799 Abnormal finding of blood chemistry, unspecified: Secondary | ICD-10-CM

## 2016-09-22 DIAGNOSIS — I4891 Unspecified atrial fibrillation: Secondary | ICD-10-CM | POA: Diagnosis not present

## 2016-09-22 DIAGNOSIS — Z9181 History of falling: Secondary | ICD-10-CM | POA: Diagnosis not present

## 2016-09-22 DIAGNOSIS — Z48815 Encounter for surgical aftercare following surgery on the digestive system: Secondary | ICD-10-CM | POA: Diagnosis not present

## 2016-09-22 DIAGNOSIS — I110 Hypertensive heart disease with heart failure: Secondary | ICD-10-CM | POA: Diagnosis not present

## 2016-09-22 DIAGNOSIS — J449 Chronic obstructive pulmonary disease, unspecified: Secondary | ICD-10-CM | POA: Diagnosis not present

## 2016-09-22 DIAGNOSIS — I509 Heart failure, unspecified: Secondary | ICD-10-CM | POA: Diagnosis not present

## 2016-09-22 DIAGNOSIS — R7989 Other specified abnormal findings of blood chemistry: Secondary | ICD-10-CM

## 2016-09-22 NOTE — Telephone Encounter (Signed)
Advised patient she needs follow up BMET. Pt will stop by office next Wednesday for repeat bloodwork. Patient verbalized understanding and agreeable to plan.

## 2016-09-23 ENCOUNTER — Telehealth: Payer: Self-pay | Admitting: Internal Medicine

## 2016-09-23 ENCOUNTER — Other Ambulatory Visit: Payer: Self-pay

## 2016-09-23 DIAGNOSIS — Z48815 Encounter for surgical aftercare following surgery on the digestive system: Secondary | ICD-10-CM | POA: Diagnosis not present

## 2016-09-23 DIAGNOSIS — I509 Heart failure, unspecified: Secondary | ICD-10-CM | POA: Diagnosis not present

## 2016-09-23 DIAGNOSIS — I110 Hypertensive heart disease with heart failure: Secondary | ICD-10-CM | POA: Diagnosis not present

## 2016-09-23 DIAGNOSIS — J449 Chronic obstructive pulmonary disease, unspecified: Secondary | ICD-10-CM | POA: Diagnosis not present

## 2016-09-23 DIAGNOSIS — Z9181 History of falling: Secondary | ICD-10-CM | POA: Diagnosis not present

## 2016-09-23 DIAGNOSIS — I4891 Unspecified atrial fibrillation: Secondary | ICD-10-CM | POA: Diagnosis not present

## 2016-09-23 MED ORDER — BUPROPION HCL ER (XL) 150 MG PO TB24: 150.0000 mg | ORAL_TABLET | Freq: Every day | ORAL | 11 refills | Status: DC

## 2016-09-23 MED ORDER — BUPROPION HCL ER (XL) 150 MG PO TB24: 150.0000 mg | ORAL_TABLET | Freq: Every day | ORAL | 3 refills | Status: DC

## 2016-09-23 NOTE — Telephone Encounter (Signed)
Tammy informed ok for verbals.

## 2016-09-23 NOTE — Telephone Encounter (Signed)
Done hardcopy to Corinne  

## 2016-09-23 NOTE — Telephone Encounter (Signed)
Verbal orders for speech therapy 1x a week for 3weeks   [phone] - OK to LVM - Tammy  Also 3lb weight gain

## 2016-09-23 NOTE — Patient Outreach (Signed)
Transition of care: Placed call to patient who reports that she is doing better. States that her blood pressure is up some and she feels better. Reports no swelliing or shortness of breath. States weight is up 2 pounds but this is thought to be related to increased appetite and normal weight gain not fluid per patients reports.  PLAN: Review plans for home visit on Monday 09/26/2016 and patient confirms.  Tomasa Rand, RN, BSN, CEN [facility] [employer] [phone]

## 2016-09-23 NOTE — Addendum Note (Signed)
Addended by: Biagio Borg on: 09/23/2016 01:18 PM   Modules accepted: Orders

## 2016-09-23 NOTE — Telephone Encounter (Signed)
Requesting order for walker( 4 wheel walker with seat and handle brakes) to be faxed to [phone] Attn: Shirlean Mylar

## 2016-09-23 NOTE — Telephone Encounter (Signed)
Ok for verbal 

## 2016-09-26 ENCOUNTER — Other Ambulatory Visit: Payer: Self-pay

## 2016-09-26 NOTE — Patient Outreach (Signed)
[facility]) Care Management   09/26/2016  KAYELYNN BEAUCHENE [birth year] [id number]  [name] is an 75 y.o. female 11:10am  Arrived for scheduled home visit.  Husband present during home visit. Subjective:  Patient reports that she is getting stronger every day. States that she feels better when her blood pressure is greater than 100.  States that she has not had to take lasix for 5 days. Reports no swelling or shortness of breath.  Patient reports that she has been very upset about her emergency abdominal surgery and colostomy. Reports that she is handling it better now. Reports that she is able to take care of her needs at this time. Patient reports her concern right now is reversal of colostomy, itching, inability to sleep and anxiety. Reports that she has been prescribed ativan but is not taking it because she is afraid to get " addicted".  Patient states that she is very worried every time she gets a pain that she will have to go back to the hospital.  Reports being active with [facility] for nursing and physical therapy. Patient reports that she is able to eat small frequent meals.  Objective:   Awake and alert. Able to walk short distance without walker. Walks with limp which patient states is normal for her.  Colostomy bag intact.  Mid line incision without any signs of infection. Vitals:   09/26/16 1119  BP: 104/60  Pulse: 64  Resp: 18  SpO2: 96%  Weight: 131 lb (59.4 kg)  Height: 1.626 m (5\' 4" )   Review of Systems  Constitutional: Positive for weight loss.  Eyes:       Reports worsening vision since August.  Respiratory: Positive for shortness of breath.        Reports shortness of breath with exertion  Cardiovascular: Positive for palpitations and leg swelling.       No recent swelling,  Gastrointestinal:       Recent colostomy. Epigastric pain for 3 weeks.  Musculoskeletal: Positive for joint pain.       Recently discontinued mobic.  Now with hands and wrist pain.  Skin: Positive for itching.       Itching at night  Neurological: Positive for dizziness and weakness.       Dizziness when BP under 100.  Psychiatric/Behavioral: Positive for depression. The patient is nervous/anxious and has insomnia.     Physical Exam  Constitutional: She is oriented to person, place, and time. She appears well-developed and well-nourished.  Cardiovascular: Intact distal pulses.   Irregular heart beat.  Respiratory: Effort normal and breath sounds normal.  GI: Soft. Bowel sounds are normal.  Colostomy right lower quad with positive bowel sounds.    Neurological: She is alert and oriented to person, place, and time.  Skin: Skin is warm and dry.  Incision to mid abdomen dry and intact. Staples removed. No signs of infection. Feet without sores or lesions. No open shingles lesions.  Psychiatric: She has a normal mood and affect. Her behavior is normal. Judgment and thought content normal.    Encounter Medications:   Outpatient Encounter Prescriptions as of 09/26/2016  Medication Sig  . albuterol (PROVENTIL HFA;VENTOLIN HFA) 108 (90 BASE) MCG/ACT inhaler Inhale 1-2 puffs into the lungs every 6 (six) hours as needed for wheezing or shortness of breath.  Marland Kitchen apixaban (ELIQUIS) 5 MG TABS tablet Take 1 tablet (5 mg total) by mouth 2 (two) times daily.  Marland Kitchen azelastine (ASTELIN) 0.1 %  nasal spray Place 2 sprays into the nose daily. Use in each nostril as directed (Patient taking differently: Place 2 sprays into both nostrils daily. )  . benzonatate (TESSALON) 100 MG capsule Take 1 capsule (100 mg total) by mouth 3 (three) times daily as needed for cough.  . budesonide-formoterol (SYMBICORT) 160-4.5 MCG/ACT inhaler Inhale 2 puffs into the lungs 2 (two) times daily.  Marland Kitchen buPROPion (WELLBUTRIN XL) 150 MG 24 hr tablet Take 1 tablet (150 mg total) by mouth daily.  . Calcium Carbonate-Vitamin D (CALCIUM + D PO) Take 1 tablet by mouth 2 (two) times daily.    . cyclobenzaprine (FLEXERIL) 5 MG tablet Take 1 tablet (5 mg total) by mouth 3 (three) times daily as needed for muscle spasms.  Marland Kitchen ezetimibe (ZETIA) 10 MG tablet Take 1 tablet (10 mg total) by mouth daily.  . fluticasone (FLONASE) 50 MCG/ACT nasal spray Place 1 spray into both nostrils 2 (two) times daily.  Marland Kitchen guaiFENesin (MUCINEX) 600 MG 12 hr tablet Take 1,200 mg by mouth 2 (two) times daily as needed for cough or to loosen phlegm.  . Lidocaine HCl 2 % CREA Apply 1 application topically daily as needed (for shingles flares).  . Melatonin 5 MG TABS Take 5 mg by mouth at bedtime.  . metoprolol succinate (TOPROL-XL) 100 MG 24 hr tablet Take 1 tablet (100 mg total) by mouth 2 (two) times daily. Take with or immediately following a meal.  . mirabegron ER (MYRBETRIQ) 50 MG TB24 tablet Take 50 mg by mouth daily.  . montelukast (SINGULAIR) 10 MG tablet Take 10 mg by mouth daily.  . Multiple Vitamin (MULTIVITAMIN) tablet Take 1 tablet by mouth daily.    Marland Kitchen omeprazole (PRILOSEC) 40 MG capsule Take 1 capsule (40 mg total) by mouth daily.  Marland Kitchen Respiratory Therapy Supplies (FLUTTER) DEVI Use 2-3 times per day  . Spacer/Aero-Holding Chambers (AEROCHAMBER Z-STAT PLUS) inhaler Use as instructed  . Tiotropium Bromide Monohydrate (SPIRIVA RESPIMAT) 2.5 MCG/ACT AERS Inhale 2 puffs into the lungs daily.  . furosemide (LASIX) 20 MG tablet Take 1 tablet (20 mg total) by mouth as needed (swelling). (Patient not taking: Reported on 09/26/2016)   No facility-administered encounter medications on file as of 09/26/2016.     Functional Status:   In your present state of health, do you have any difficulty performing the following activities: 09/26/2016 08/23/2016  Hearing? N -  Vision? Y -  Difficulty concentrating or making decisions? N -  Walking or climbing stairs? N -  Dressing or bathing? N -  Doing errands, shopping? Y N  Preparing Food and eating ? N -  Using the Toilet? N -  In the past six months, have you  accidently leaked urine? Y -  Do you have problems with loss of bowel control? Y -  Managing your Medications? N -  Managing your Finances? N -  Housekeeping or managing your Housekeeping? N -  Some recent data might be hidden    Fall/Depression Screening:    PHQ 2/9 Scores 09/26/2016 12/02/2014 11/29/2013  PHQ - 2 Score 5 1 1   PHQ- 9 Score 14 - -   Fall Risk  09/26/2016 12/02/2014 11/29/2013  Falls in the past year? Yes Yes No  Number falls in past yr: 1 1 -  Injury with Fall? No No -  Follow up Falls prevention discussed - -   Assessment:   (1) reviewed [facility] transition of care program.  Reviewed consent on file and declines any needed changes.  Patient has new patient packet that she got in the hospital including magnet. I provided my contact card and [facility] calendar.  (2) positive depression on depression screening. (3) itching. Worse at night. (4) colostomy working well. Pending reversal. (5) CHF management: Patient is weighing daily, watching her salt content and keeping daily logs.  (6) Atrial Fib management:  Patient is pending medication change to amiodarone. Daily monitoring of heart rate. (7) wants per esophagus to be stretched. Pending GI visit this week.  Plan:  (1) Will continue weekly outreaches. [facility] consent in chart. (2) Encouraged patient to take medication as prescribed even prn Ativan to assist with lack of sleep and anxiety.  Will send depression screening to MD. (3) Will refer to [place] for medication review for causes of itching. (4) encouraged patient to call MD for any abdominal pain or problems with colostomy. She agreed.as  (5)Provided [facility] calendar for weight logs. Reviewed CHF zones. Provided CHF packet.  Encouraged patient to continue to take medications as prescribed. Provided low salt diet tear off page and reviewed with patient. (6) encouraged patient to take medications as prescribed. Reviewed A fib action plan in the [facility] calendar. (7) encouraged  patient to attend MD appointment.   This note sent to MD. Next follow up via phone in 7 days. Goal setting and care planning during home visit and primary goal is to avoid readmission.  [facility] CM Care Plan Problem One   Flowsheet Row Most Recent Value  Care Plan Problem One  Recent admission for ileostomy and abdominal surgery.  Role Documenting the Problem One  Care Management [place] for Problem One  Active  [facility] Long Term Goal (31-90 days)  Patient will report no readmissions for the next 31 days.  [facility] Long Term Goal Start Date  09/15/16  Interventions for Problem One Long Term Goal  Home visit completed. [facility] calendar provided.   [facility] CM Short Term Goal #1 (0-30 days)  Patient will report feeling stronger and having improved weight gain in the next 30 days.  [facility] CM Short Term Goal #1 Start Date  09/15/16  Interventions for Short Term Goal #1  offered suggestion of carnation instant breakfast as patient reports that she can not tolerate boost or ensure.  Encouraged patient to avoid as much salt as possible.  [facility] CM Short Term Goal #2 (0-30 days)  Patient will report weighing daily for the next 30 days.  [facility] CM Short Term Goal #2 Start Date  09/15/16  Interventions for Short Term Goal #2  Reviewed CHF zones. Provided CHF booklet, provided low salt poster. Reviewed importance of calling MD for weight gain.      Tomasa Rand, RN, BSN, CEN [facility] [employer] [phone]

## 2016-09-26 NOTE — Telephone Encounter (Signed)
faxed

## 2016-09-26 NOTE — Addendum Note (Signed)
Addended by: Thana Ates on: 09/26/2016 02:17 PM   Modules accepted: Orders

## 2016-09-27 ENCOUNTER — Telehealth: Payer: Self-pay | Admitting: Cardiology

## 2016-09-27 NOTE — Telephone Encounter (Signed)
New message   Pt verbalized that she has been put on Wellbutrin and she wants the rn to call her to talk about it

## 2016-09-27 NOTE — Telephone Encounter (Signed)
Pt reports PCP started her on Wellbutrin XL 150 mg daily and stopped Citalopram.  She started the Wellbutrin 09/24/16. She understands I will call her this week/next on when to start Amiodarone/DCCV.

## 2016-09-28 ENCOUNTER — Telehealth: Payer: Self-pay | Admitting: Physician Assistant

## 2016-09-28 ENCOUNTER — Other Ambulatory Visit: Payer: Medicare Other

## 2016-09-28 DIAGNOSIS — Z48815 Encounter for surgical aftercare following surgery on the digestive system: Secondary | ICD-10-CM | POA: Diagnosis not present

## 2016-09-28 DIAGNOSIS — I4891 Unspecified atrial fibrillation: Secondary | ICD-10-CM | POA: Diagnosis not present

## 2016-09-28 DIAGNOSIS — I110 Hypertensive heart disease with heart failure: Secondary | ICD-10-CM | POA: Diagnosis not present

## 2016-09-28 DIAGNOSIS — J449 Chronic obstructive pulmonary disease, unspecified: Secondary | ICD-10-CM | POA: Diagnosis not present

## 2016-09-28 DIAGNOSIS — Z9181 History of falling: Secondary | ICD-10-CM | POA: Diagnosis not present

## 2016-09-28 DIAGNOSIS — I509 Heart failure, unspecified: Secondary | ICD-10-CM | POA: Diagnosis not present

## 2016-09-28 NOTE — Telephone Encounter (Signed)
Sherri, I spoke with you about this patient last week and we reviewed that she needed the repeat BMET drawn because her Cr was rising. The patient was supposed to have this at f/u 10/18 OV but this was deferred and the patient was to return today to have it drawn. I do not see that it's been collected yet - this would impact her anticoagulation dose if her Cr remains >1.5. Can you please follow up on this? Thank you again! Vernia Teem PA-C

## 2016-09-28 NOTE — Telephone Encounter (Signed)
I spoke with pt on 10/19 about need for f/u BMET. Was scheduled for lab today -- but looks like pt rescheduled to tomorrow.    I will continue to follow until patient has completed the blood work.    Thanks :)

## 2016-09-29 ENCOUNTER — Telehealth: Payer: Self-pay | Admitting: *Deleted

## 2016-09-29 ENCOUNTER — Encounter: Payer: Self-pay | Admitting: Physician Assistant

## 2016-09-29 ENCOUNTER — Ambulatory Visit (INDEPENDENT_AMBULATORY_CARE_PROVIDER_SITE_OTHER): Payer: Medicare Other | Admitting: Acute Care

## 2016-09-29 ENCOUNTER — Encounter: Payer: Self-pay | Admitting: Acute Care

## 2016-09-29 ENCOUNTER — Telehealth: Payer: Self-pay | Admitting: Acute Care

## 2016-09-29 ENCOUNTER — Other Ambulatory Visit: Payer: Medicare Other | Admitting: *Deleted

## 2016-09-29 ENCOUNTER — Ambulatory Visit (INDEPENDENT_AMBULATORY_CARE_PROVIDER_SITE_OTHER): Payer: Medicare Other | Admitting: Physician Assistant

## 2016-09-29 ENCOUNTER — Ambulatory Visit (INDEPENDENT_AMBULATORY_CARE_PROVIDER_SITE_OTHER)
Admission: RE | Admit: 2016-09-29 | Discharge: 2016-09-29 | Disposition: A | Discharge status: Home / Self Care | Payer: Medicare Other | Source: Ambulatory Visit | Attending: Acute Care | Admitting: Acute Care

## 2016-09-29 VITALS — BP 114/70 | HR 100 | Temp 96.9°F | Ht 64.0 in | Wt 133.5 lb

## 2016-09-29 VITALS — BP 110/58 | HR 100 | Wt 133.0 lb

## 2016-09-29 DIAGNOSIS — Z7901 Long term (current) use of anticoagulants: Secondary | ICD-10-CM

## 2016-09-29 DIAGNOSIS — R938 Abnormal findings on diagnostic imaging of other specified body structures: Secondary | ICD-10-CM | POA: Diagnosis not present

## 2016-09-29 DIAGNOSIS — K222 Esophageal obstruction: Secondary | ICD-10-CM

## 2016-09-29 DIAGNOSIS — R911 Solitary pulmonary nodule: Secondary | ICD-10-CM

## 2016-09-29 DIAGNOSIS — R05 Cough: Secondary | ICD-10-CM | POA: Diagnosis not present

## 2016-09-29 DIAGNOSIS — E7800 Pure hypercholesterolemia, unspecified: Secondary | ICD-10-CM | POA: Diagnosis not present

## 2016-09-29 DIAGNOSIS — R1319 Other dysphagia: Secondary | ICD-10-CM

## 2016-09-29 DIAGNOSIS — I10 Essential (primary) hypertension: Secondary | ICD-10-CM

## 2016-09-29 DIAGNOSIS — R9389 Abnormal findings on diagnostic imaging of other specified body structures: Secondary | ICD-10-CM

## 2016-09-29 LAB — BASIC METABOLIC PANEL
BUN: 21 mg/dL (ref 7–25)
CO2: 23 mmol/L (ref 20–31)
Calcium: 8.8 mg/dL (ref 8.6–10.4)
Chloride: 108 mmol/L (ref 98–110)
Creat: 1.15 mg/dL — ABNORMAL HIGH (ref 0.60–0.93)
Glucose, Bld: 96 mg/dL (ref 65–99)
Potassium: 3.9 mmol/L (ref 3.5–5.3)
Sodium: 141 mmol/L (ref 135–146)

## 2016-09-29 MED ORDER — PREDNISONE 10 MG PO TABS: ORAL_TABLET | ORAL | 0 refills | Status: DC

## 2016-09-29 MED ORDER — NYSTATIN 100000 UNIT/ML MT SUSP: OROMUCOSAL | 0 refills | Status: DC

## 2016-09-29 MED ORDER — DOXYCYCLINE HYCLATE 100 MG PO TABS: 100.0000 mg | ORAL_TABLET | Freq: Two times a day (BID) | ORAL | 0 refills | Status: DC

## 2016-09-29 NOTE — Telephone Encounter (Signed)
Lm for the patient asked her to call me back. We need to be sure she is not on O2 at night.  Her procedure was scheduled in the [facility], 4th floor.

## 2016-09-29 NOTE — Progress Notes (Signed)
History of Present Illness [name] is a 75 y.o. female with history of bronchitis, COPD( Moderate), GERD  and allergic rhinitis  Followed by Dr. Ashok Cordia   09/29/2016 Acute OV: Pt. Presents to the office for acute cough which started 3 days ago. It is productive for yellow thick secretions.Her home health nurse advised her to come in for treatment as she feared it would evolve into a COPD exacerbation.She denies fever, chills, chest pain,orthopnea or hemoptysis. She is compliant with her Spiriva and Symbicort, Flonase and Astelin. She is using Mucinex and Tessalon Perles currently, but is not getting any better.  Tests CXR>> 09/29/2016>> pending   PFT 03/01/16: FVC 2.55 L (91%) FEV1 1.30 L (62%) FEV1/FVC 0.51 FEF 25-75 0.56 L (34%) no bronchodilator response TLC 5.66 L (111%) RV 116% ERV 133% DLCO uncorrected 62% (hemoglobin 15.1)  IMAGING CXR PA/LAT 03/22/16 :  Hyperinflation with elevation of left hemidiaphragm. No new focal opacity. Heart normal in size & mediastinum normal in contour.  Past medical hx Past Medical History:  Diagnosis Date  . Allergic rhinitis   . Anxiety   . Atrial septal aneurysm   . Basal cell carcinoma   . Bradycardia   . Cataract    bil cataracts removed  . Chronic combined systolic and diastolic CHF (congestive heart failure) ([place])   . CKD (chronic kidney disease), stage III   . COPD (chronic obstructive pulmonary disease) ([place])   . Depression 11/29/2013  . DI (detrusor instability)   . Esophageal stricture   . GERD (gastroesophageal reflux disease)   . Hemorrhoids   . Hiatal hernia   . History of kidney stones   . HLD (hyperlipidemia)   . HTN (hypertension)   . Ischemic colon ([place])    a. 08/2016 - adm to  [facility] with a ruptured colon s/p right hemicolectomy with ileostomy formation for ischemic and necrotic colon.   . Lung nodule    a. followed by pulmonology  . Mild CAD    a. minor nonobstructive CAD 08/22/16.  .  Nephrolithiasis    hx  . NICM (nonischemic cardiomyopathy) ([place])   . Orthostatic hypotension   . Osteoarthritis   . PAF (paroxysmal atrial fibrillation) ([place])    a. diagnosed 07/2016 s/p DCCV 08/01/16 with recurrence, managed with rate control for now.   . Pneumonia 2013  . PVC's (premature ventricular contractions)   . Shingles since Nov 18, 2013   on right arm from shoulder to wrist  . Status post dilation of esophageal narrowing   . Stroke [facility]) 2001   STROKES, TIA'S  . Syncope    a. 08/2016 felt due to orthostatic hypotension.  Marland Kitchen TIA (transient ischemic attack) last 2001   anticoagulation therapy on coumadin     Past surgical hx, Family hx, Social hx all reviewed.  Current Outpatient Prescriptions on File Prior to Visit  Medication Sig  . albuterol (PROVENTIL HFA;VENTOLIN HFA) 108 (90 BASE) MCG/ACT inhaler Inhale 1-2 puffs into the lungs every 6 (six) hours as needed for wheezing or shortness of breath.  Marland Kitchen apixaban (ELIQUIS) 5 MG TABS tablet Take 1 tablet (5 mg total) by mouth 2 (two) times daily.  Marland Kitchen azelastine (ASTELIN) 0.1 % nasal spray Place 2 sprays into the nose daily. Use in each nostril as directed (Patient taking differently: Place 2 sprays into both nostrils daily. )  . benzonatate (TESSALON) 100 MG capsule Take 1 capsule (100 mg total) by mouth 3 (three) times daily as needed for cough.  Marland Kitchen  budesonide-formoterol (SYMBICORT) 160-4.5 MCG/ACT inhaler Inhale 2 puffs into the lungs 2 (two) times daily.  Marland Kitchen buPROPion (WELLBUTRIN XL) 150 MG 24 hr tablet Take 1 tablet (150 mg total) by mouth daily.  . Calcium Carbonate-Vitamin D (CALCIUM + D PO) Take 1 tablet by mouth 2 (two) times daily.   . cyclobenzaprine (FLEXERIL) 5 MG tablet Take 1 tablet (5 mg total) by mouth 3 (three) times daily as needed for muscle spasms.  Marland Kitchen ezetimibe (ZETIA) 10 MG tablet Take 1 tablet (10 mg total) by mouth daily.  . fluticasone (FLONASE) 50 MCG/ACT nasal spray Place 1 spray into both nostrils 2 (two)  times daily.  . furosemide (LASIX) 20 MG tablet Take 1 tablet (20 mg total) by mouth as needed (swelling).  Marland Kitchen guaiFENesin (MUCINEX) 600 MG 12 hr tablet Take 1,200 mg by mouth 2 (two) times daily as needed for cough or to loosen phlegm.  . Lidocaine HCl 2 % CREA Apply 1 application topically daily as needed (for shingles flares).  . Melatonin 5 MG TABS Take 5 mg by mouth at bedtime.  . metoprolol succinate (TOPROL-XL) 100 MG 24 hr tablet Take 1 tablet (100 mg total) by mouth 2 (two) times daily. Take with or immediately following a meal.  . mirabegron ER (MYRBETRIQ) 50 MG TB24 tablet Take 50 mg by mouth daily.  . montelukast (SINGULAIR) 10 MG tablet Take 10 mg by mouth daily.  . Multiple Vitamin (MULTIVITAMIN) tablet Take 1 tablet by mouth daily.    Marland Kitchen nystatin (MYCOSTATIN) 100000 UNIT/ML suspension Take 5 cc by mouth, swish and swallow 4 times daily.  Marland Kitchen omeprazole (PRILOSEC) 40 MG capsule Take 1 capsule (40 mg total) by mouth daily.  Marland Kitchen Respiratory Therapy Supplies (FLUTTER) DEVI Use 2-3 times per day  . Spacer/Aero-Holding Chambers (AEROCHAMBER Z-STAT PLUS) inhaler Use as instructed  . Tiotropium Bromide Monohydrate (SPIRIVA RESPIMAT) 2.5 MCG/ACT AERS Inhale 2 puffs into the lungs daily.  . [DISCONTINUED] valsartan (DIOVAN) 160 MG tablet Take 1 tablet (160 mg total) by mouth daily.   No current facility-administered medications on file prior to visit.      Allergies  Allergen Reactions  . Clarithromycin Other (See Comments)    REACTION: possible rash but could have been legionarres dz with rash  . Lipitor [Atorvastatin] Other (See Comments)    MUSCLE SPASMS  . Simvastatin Other (See Comments)    Muscle and joint pain  . Cardizem [Diltiazem Hcl] Hives  . Contrast Media [Iodinated Diagnostic Agents]     FYI - during admission at [facility] 08/2016 patient developed hives, question related to cardizem or contrast dye - not clear  . Crestor [Rosuvastatin Calcium]     fagitue and joint  pain, "NO STATINS"    Review Of Systems:  Constitutional:   No  weight loss, night sweats,  Fevers, chills, fatigue, or  lassitude.  HEENT:   No headaches,  Difficulty swallowing,  Tooth/dental problems, or  Sore throat,                No sneezing, itching, ear ache, nasal congestion, post nasal drip,   CV:  No chest pain,  Orthopnea, PND, trace swelling in lower extremities, anasarca, dizziness, palpitations, syncope.   GI  No heartburn, indigestion, abdominal pain, nausea, vomiting, diarrhea, change in bowel habits, loss of appetite, bloody stools.   Resp: + shortness of breath with exertion or at rest.  + excess mucus, + productive cough,  No non-productive cough,  No coughing up of blood.  +  change in color of mucus.  + wheezing.  No chest wall deformity  Skin: no rash or lesions.  GU: no dysuria, change in color of urine, no urgency or frequency.  No flank pain, no hematuria   MS:  No joint pain or swelling.  No decreased range of motion.  No back pain.  Psych:  No change in mood or affect. No depression or anxiety.  No memory loss.   Vital Signs BP 114/70 (BP Location: Left Arm, Patient Position: Sitting, Cuff Size: Normal)   Pulse 100   Temp (!) 96.9 F (36.1 C) (Oral)   Ht 5\' 4"  (1.626 m)   Wt 133 lb 8 oz (60.6 kg)   LMP  (Exact Date)   SpO2 95%   BMI 22.92 kg/m    Physical Exam:  General- No distress,  A&Ox3, pleasant but frequent cough ENT: No sinus tenderness, TM clear, pale nasal mucosa, no oral exudate,no post nasal drip, no LAN Cardiac: S1, S2, regular rate and rhythm, no murmur Chest: + Exp. wheeze/ no rales/ diminished per bases; no accessory muscle use, no nasal flaring, no sternal retractions Abd.: Soft Non-tender Ext: No clubbing cyanosis, trace edema Neuro:  normal strength Skin: No rashes, warm and dry Psych: normal mood and behavior   Assessment/Plan  Gold B Copd with acute bronchitis New Flare Plan: CXR today. We will send in a  prescription for Doxycycline 100 mg twice daily x 7 days Prednisone taper; 10 mg tablets: 3 tabs x 2 days, 2 tabs x 2 days 1 tab x 2 days then stop. Delsym cough Syrup 5 cc every 12 hours for cough. Continue your Symbicort and Singulair as you have been doing. Continue your Flonase and Nastolin as you have been doing. Continue Mucinex as you have been doing. Follow up with Dr. Ashok Cordia in Nov as is already scheduled. Please contact office for sooner follow up if symptoms do not improve or worsen or seek emergency care     Lung nodule For follow up CT scan 01/2017 ( Per CT angio 08/2016 for follow up of ground glass nodules) Plan: Scheduled CT for 01/2017 per recommendations for follow up of ground glass nodules 08/2016)    Magdalen Spatz, NP 09/29/2016  11:44 AM

## 2016-09-29 NOTE — Patient Instructions (Addendum)
It is good to see you today. CXR today. We will send in a prescription for Doxycycline 100 mg twice daily x 7 days Prednisone taper; 10 mg tablets: 3 tabs x 2 days, 2 tabs x 2 days 1 tab x 2 days then stop. Delsym cough Syrup 5 cc every 12 hours for cough. Continue your Symbicort and Singulair as you have been doing. Continue your Flonase and Nastolin as you have been doing. Continue Mucinex as you have been doing. CT Chest without contrast in 01/2017 Follow up with Dr. Ashok Cordia in Nov as is already scheduled. Please contact office for sooner follow up if symptoms do not improve or worsen or seek emergency care

## 2016-09-29 NOTE — Addendum Note (Signed)
Addended by: Eulis Foster on: 09/29/2016 07:43 AM   Modules accepted: Orders

## 2016-09-29 NOTE — Telephone Encounter (Signed)
Called and spoke with Jinny Blossom at [facility] and she is aware that per SG office note that doxy should be 1 po BID x 7 days.

## 2016-09-29 NOTE — Assessment & Plan Note (Addendum)
New Flare Plan: CXR today. We will send in a prescription for Doxycycline 100 mg twice daily x 7 days Prednisone taper; 10 mg tablets: 3 tabs x 2 days, 2 tabs x 2 days 1 tab x 2 days then stop. Delsym cough Syrup 5 cc every 12 hours for cough. Continue your Symbicort and Singulair as you have been doing. Continue your Flonase and Nastolin as you have been doing. Continue Mucinex as you have been doing. Follow up with Dr. Ashok Cordia in Nov as is already scheduled. Please contact office for sooner follow up if symptoms do not improve or worsen or seek emergency care

## 2016-09-29 NOTE — Progress Notes (Signed)
Note reviewed.  Sonia Baller Ashok Cordia, M.D. [facility] Pulmonary & Critical Care Pager:  [phone] After 3pm or if no response, call [phone] 11:55 AM 09/29/16

## 2016-09-29 NOTE — Assessment & Plan Note (Addendum)
For follow up CT scan 01/2017 ( Per CT angio 08/2016 for follow up of ground glass nodules) Plan: Scheduled CT for 01/2017 per recommendations for follow up of ground glass nodules 08/2016)

## 2016-09-29 NOTE — Telephone Encounter (Signed)
She has atrial fibrillation and is on Eliquis.  Was planning for cardioversion.  She would require anticoagulation post cardioversion for at least one month. She would have to be anticoagulated for 3 weeks prior to cardioversion as well with out interruption of her Eliquis. We do not yet have a date for cardioversion. I am not sure how that changes the plans for her endoscopy.  We have also not received a form.  This is the fax number: [phone].  WC

## 2016-09-29 NOTE — Progress Notes (Signed)
Complicated patient. Agree with initial assessment and plans to address symptomatic dysphagia

## 2016-09-29 NOTE — Progress Notes (Signed)
Subjective:    Patient ID: [name], female    DOB: [birth year], 75 y.o.   MRN: [id number]  HPI  [name] is a pleasant 75 year old white female known to Dr. Henrene Pastor. She has history of esophageal stricture and has had prior esophageal dilation. Last EGD was done in May 2017 with finding of a distal esophageal stricture/stenosis which was balloon dilated to 20 mm. Patient comes in today with recurrent complaints of dysphagia. She says she's been having significant problems over the past few weeks with dysphagia to pills and solid food. She has not had any episodes of regurgitation but says she will frequently have to stop eating to allow the food to go down. She's having to drink fluids regularly with her meals as well to help push the food down. She denies any heartburn or indigestion. Patient does have a complicated past medical history. She was hospitalized in September 2017 at [facility] with an ischemic bowel. She underwent a right hemicolectomy with ileostomy for ischemic and necrotic: Per Dr. Coralie Keens .Marland Kitchen She has recovered from that surgery and is anticipating having an ileostomy reversal in a couple of months. She is also had an episode with paroxysmal atrial fibrillation and is being considered for cardioversion. She is followed by Dr. Shellia Carwin for cardiology. She's currently on Eliquis. She also has nonobstructive coronary artery disease, chronic kidney disease COPD, and digestive heart failure with EF of [id number] and had an embolic CVA in [id number]. Patient relates that she did have an episode a couple of weeks ago of "coughing" up a small amount of dark blood this has not recurred. She does not feel that she vomited this or brought it up from her esophagus. Again she has no complaints of odynophagia, no melena or hematochezia. She has an appointment with pulmonary today because of worsening cough over the past week.   Review of Systems Pertinent positive and negative review of systems were  noted in the above HPI section.  All other review of systems was otherwise negative.  Outpatient Encounter Prescriptions as of 09/29/2016  Medication Sig  . albuterol (PROVENTIL HFA;VENTOLIN HFA) 108 (90 BASE) MCG/ACT inhaler Inhale 1-2 puffs into the lungs every 6 (six) hours as needed for wheezing or shortness of breath.  Marland Kitchen apixaban (ELIQUIS) 5 MG TABS tablet Take 1 tablet (5 mg total) by mouth 2 (two) times daily.  Marland Kitchen azelastine (ASTELIN) 0.1 % nasal spray Place 2 sprays into the nose daily. Use in each nostril as directed (Patient taking differently: Place 2 sprays into both nostrils daily. )  . benzonatate (TESSALON) 100 MG capsule Take 1 capsule (100 mg total) by mouth 3 (three) times daily as needed for cough.  . budesonide-formoterol (SYMBICORT) 160-4.5 MCG/ACT inhaler Inhale 2 puffs into the lungs 2 (two) times daily.  Marland Kitchen buPROPion (WELLBUTRIN XL) 150 MG 24 hr tablet Take 1 tablet (150 mg total) by mouth daily.  . Calcium Carbonate-Vitamin D (CALCIUM + D PO) Take 1 tablet by mouth 2 (two) times daily.   . cyclobenzaprine (FLEXERIL) 5 MG tablet Take 1 tablet (5 mg total) by mouth 3 (three) times daily as needed for muscle spasms.  Marland Kitchen ezetimibe (ZETIA) 10 MG tablet Take 1 tablet (10 mg total) by mouth daily.  . fluticasone (FLONASE) 50 MCG/ACT nasal spray Place 1 spray into both nostrils 2 (two) times daily.  . furosemide (LASIX) 20 MG tablet Take 1 tablet (20 mg total) by mouth as needed (swelling).  Marland Kitchen guaiFENesin (MUCINEX) 600 MG 12  hr tablet Take 1,200 mg by mouth 2 (two) times daily as needed for cough or to loosen phlegm.  . Lidocaine HCl 2 % CREA Apply 1 application topically daily as needed (for shingles flares).  . Melatonin 5 MG TABS Take 5 mg by mouth at bedtime.  . metoprolol succinate (TOPROL-XL) 100 MG 24 hr tablet Take 1 tablet (100 mg total) by mouth 2 (two) times daily. Take with or immediately following a meal.  . mirabegron ER (MYRBETRIQ) 50 MG TB24 tablet Take 50 mg by mouth  daily.  . montelukast (SINGULAIR) 10 MG tablet Take 10 mg by mouth daily.  . Multiple Vitamin (MULTIVITAMIN) tablet Take 1 tablet by mouth daily.    Marland Kitchen nystatin (MYCOSTATIN) 100000 UNIT/ML suspension Take 5 cc by mouth, swish and swallow 4 times daily.  Marland Kitchen omeprazole (PRILOSEC) 40 MG capsule Take 1 capsule (40 mg total) by mouth daily.  Marland Kitchen Respiratory Therapy Supplies (FLUTTER) DEVI Use 2-3 times per day  . Spacer/Aero-Holding Chambers (AEROCHAMBER Z-STAT PLUS) inhaler Use as instructed  . Tiotropium Bromide Monohydrate (SPIRIVA RESPIMAT) 2.5 MCG/ACT AERS Inhale 2 puffs into the lungs daily.   No facility-administered encounter medications on file as of 09/29/2016.    Allergies  Allergen Reactions  . Clarithromycin Other (See Comments)    REACTION: possible rash but could have been legionarres dz with rash  . Lipitor [Atorvastatin] Other (See Comments)    MUSCLE SPASMS  . Simvastatin Other (See Comments)    Muscle and joint pain  . Cardizem [Diltiazem Hcl] Hives  . Contrast Media [Iodinated Diagnostic Agents]     FYI - during admission at [facility] 08/2016 patient developed hives, question related to cardizem or contrast dye - not clear  . Crestor [Rosuvastatin Calcium]     fagitue and joint pain, "NO STATINS"   Patient Active Problem List   Diagnosis Date Noted  . Atrial fibrillation ([place]) 09/17/2016  . Acute on chronic respiratory failure with hypoxia ([place])   . Acute on chronic combined systolic (congestive) and diastolic (congestive) heart failure 08/17/2016  . Hemoptysis 08/17/2016  . Syncope 08/10/2016  . PAF (paroxysmal atrial fibrillation) ([place]) 08/10/2016  . COPD (chronic obstructive pulmonary disease) ([place]) 08/10/2016  . Hypotension 08/10/2016  . Faintness   . Volume depletion   . Chronic pain syndrome   . Atrial fibrillation with rapid ventricular response ([place]) 07/27/2016  . Acute respiratory failure ([place]) 07/27/2016  . Combined congestive systolic and diastolic  heart failure ([place]) 07/27/2016  . HLD (hyperlipidemia) 07/27/2016  . Essential hypertension 07/27/2016  . GERD (gastroesophageal reflux disease) 07/27/2016  . Insomnia 07/27/2016  . Depression with anxiety 07/27/2016  . Persistent atrial fibrillation ([place]) 07/27/2016  . Respiratory distress   . Gluteus medius tear 03/15/2016  . Hyperglycemia 11/12/2015  . Lumbar scoliosis 07/03/2015  . Stricture and stenosis of esophagus 03/03/2014  . On home oxygen therapy   . Swelling, mass, or lump in chest 12/19/2013  . Right wrist pain 12/19/2013  . Depression 11/29/2013  . Dysphagia, unspecified(787.20) 09/30/2013  . Lung nodule 08/01/2013  . Special screening for malignant neoplasms, colon 03/20/2013  . Lower back pain 01/21/2013  . COPD exacerbation ([place]) 09/05/2012  . Encounter for long-term (current) use of high-risk medication 02/13/2012  . Sleep related leg cramps 10/01/2011  . Basal cell carcinoma   . Osteoarthritis   . History of CVA (cerebrovascular accident)   . DUB (dysfunctional uterine bleeding)   . Atrophic vaginitis   . Menopausal symptoms   .  DI (detrusor instability)   . Preventative health care 08/07/2011  . Long term current use of Coumadin 01/05/2011  . Transient cerebral ischemia 03/25/2009  . FATIGUE 06/05/2008  . ALLERGIC RHINITIS 04/24/2008  . NEPHROLITHIASIS, HX OF 10/05/2007  . RECTOCELE WITHOUT MENTION OF UTERINE PROLAPSE 10/04/2007  . BLADDER SUSPENSION, HX OF 10/04/2007  . Hyperlipidemia 08/11/2007  . HEMORRHOIDS 08/11/2007  . Gold B Copd with acute bronchitis 08/11/2007  . GERD 08/11/2007  . IBS 08/11/2007  . CEREBROVASCULAR ACCIDENT, HX OF 08/11/2007  . FIBROCYSTIC BREAST DISEASE, HX OF 08/11/2007   Social History   Social History  . Marital status: Married    Spouse name: N/A  . Number of children: 2  . Years of education: N/A   Occupational History  . retired Retired   Social History Main Topics  . Smoking status: Former Smoker     Packs/day: 3.00    Years: 42.00    Types: Cigarettes    Start date: 08/04/1954    Quit date: 12/06/1995  . Smokeless tobacco: Never Used  . Alcohol use No     Comment: occas very little  . Drug use: No  . Sexual activity: Not Currently    Birth control/ protection: Surgical   Other Topics Concern  . Not on file   Social History Narrative   Retired- Dentist Pulmonary:   Originally from [place]. Has always lived in [place]. She has traveled to [place], [place], [place], [place], [place], [place]. Has been on multiple cruises. 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 She did use a face mask consistently to avoid chemical fume exposure. She may have only had an inhaled exposure a couple of times. She currently has a dog. Remote exposure to a parakeet. No mold or hot tub exposure. No asbestos exposure.     Ms. Ragatz family history includes Breast cancer in her maternal aunt and mother; COPD in her mother; Emphysema in her mother; Uterine cancer in her mother.      Objective:    Vitals:   09/29/16 0825  BP: (!) 110/58  Pulse: 100    Physical Exam  well-developed older white female in no acute distress, pleasant accompanied by her husband, blood pressure 110/58 pulse 100, BMI 22.8. HEENT ;nontraumatic normocephalic EOMI PERRLA sclera anicteric, Cardiovascular; regular rate and rhythm with S1-S2 no murmur rub or gallop, Pulmonary ;somewhat decreased breath sounds bilaterally, Abdomen ;soft, bowel sounds are active there is no palpable mass or hepatosplenomegaly she is midline incisional scar and an ileostomy in the right lower quadrant Ext;no  Cyanosis or Edema Skin Warm and Dry, Neuropsych; Mood and Affect Appropriate       Assessment & Plan:   #66 75 year old white female with recurrent solid food and pill dysphagia. Patient has history of esophageal stricture with last balloon  dilation to 20 mm in May 2017. She says she did have response from this dilation but over the past few weeks has had recurrence and persistence of dysphagia. #2 chronic anticoagulation-on eliquis #3 history of paroxysmal atrial fibrillation 4 congestive heart failure-EF 4550% Or 5 COPD #6 chronic kidney disease stage III #4 prior history of CVA 2000 and followed #5 episode of acute right colon ischemia-and syncope September 2017-status post right hemicolectomy and ileostomy  Plan; patient will be scheduled for EGD with esophageal dilation with Dr. Henrene Pastor. Procedure discussed in detail with the patient and her  husband including risks and benefits and they are agreeable to proceed We will communicate with her cardiologist Dr. Shellia Carwin to a sure that holding Eliquis for 24-48  hours prior to esophageal dilation is reasonable for this patient.   Galit Urich Genia Harold PA-C 09/29/2016   Cc: Biagio Borg, MD

## 2016-09-29 NOTE — Telephone Encounter (Signed)
Patient called and states that she can't get antibiotic until we call pharmacy [facility] to confirm correct directions -Pt can be reached at [phone]

## 2016-09-29 NOTE — Patient Instructions (Addendum)
We sent a prescription for an oral suspension to [facility] in [place].   You have been scheduled for an endoscopy. Please follow written instructions given to you at your visit today. If you use inhalers (even only as needed), please bring them with you on the day of your procedure. Your physician has requested that you go to www.[email or web address].com and enter the access code given to you at your visit today. This web site gives a general overview about your procedure. However, you should still follow specific instructions given to you by our office regarding your preparation for the procedure.

## 2016-09-29 NOTE — Telephone Encounter (Signed)
09/29/2016   RE: [name] DOB: 1941/01/21 MRN: [id number]   Dear Dr. Ocie Doyne [facility],    We have scheduled the above patient for an endoscopic procedure. Our records show that she is on anticoagulation therapy.   Please advise as to how long the patient may come off her therapy of Eliquis prior to the procedure, which is scheduled for 10-19-2016.  Please fax back/ or route the completed form to [facility] [phone].   Sincerely,    Amy Esterwood PA-C

## 2016-09-30 ENCOUNTER — Telehealth: Payer: Self-pay | Admitting: *Deleted

## 2016-09-30 ENCOUNTER — Ambulatory Visit (HOSPITAL_COMMUNITY): Admit: 2016-09-30 | Payer: Medicare Other | Admitting: Cardiovascular Disease

## 2016-09-30 ENCOUNTER — Encounter (HOSPITAL_COMMUNITY): Payer: Self-pay

## 2016-09-30 DIAGNOSIS — Z9181 History of falling: Secondary | ICD-10-CM | POA: Diagnosis not present

## 2016-09-30 DIAGNOSIS — I4891 Unspecified atrial fibrillation: Secondary | ICD-10-CM | POA: Diagnosis not present

## 2016-09-30 DIAGNOSIS — I509 Heart failure, unspecified: Secondary | ICD-10-CM | POA: Diagnosis not present

## 2016-09-30 DIAGNOSIS — I110 Hypertensive heart disease with heart failure: Secondary | ICD-10-CM | POA: Diagnosis not present

## 2016-09-30 DIAGNOSIS — J449 Chronic obstructive pulmonary disease, unspecified: Secondary | ICD-10-CM | POA: Diagnosis not present

## 2016-09-30 DIAGNOSIS — Z48815 Encounter for surgical aftercare following surgery on the digestive system: Secondary | ICD-10-CM | POA: Diagnosis not present

## 2016-09-30 SURGERY — CARDIOVERSION
Anesthesia: Monitor Anesthesia Care

## 2016-09-30 NOTE — Telephone Encounter (Signed)
Called the patient and asked if she was on oxygen at night.  The patient said she is not.  She said she was several years ago in 2015.

## 2016-09-30 NOTE — Telephone Encounter (Signed)
See note from 10-27.

## 2016-09-30 NOTE — Telephone Encounter (Signed)
Dr. Curt Bears, .The form is actually the letter.   The way you routed your response to me was perfect. However, we do have this patient, [name], scheduled for 10-19-2016.  Dr. Scarlette Shorts does not have another open appointments in November. We just want clarification on the Eliquis instructions please.

## 2016-10-03 ENCOUNTER — Other Ambulatory Visit: Payer: Self-pay

## 2016-10-03 ENCOUNTER — Encounter: Payer: Self-pay | Admitting: Pulmonary Disease

## 2016-10-03 NOTE — Patient Outreach (Signed)
Transition of care call: Placed call to patient for weekly transition of care: Patient reports that she is recovering from bronchitis. States that she is feeling some better. Reports no recent changes in weight. States that her blood pressure is doing well.   Patient reports that she is pending approval to have her endoscopy and dilation from cardiology.   Reports no other new issues or concerns.   PLAN: Will continue weekly transition of care calls.  Tomasa Rand, RN, BSN, CEN [facility] [employer] [phone]

## 2016-10-04 ENCOUNTER — Encounter: Payer: Self-pay | Admitting: *Deleted

## 2016-10-04 DIAGNOSIS — Z48815 Encounter for surgical aftercare following surgery on the digestive system: Secondary | ICD-10-CM | POA: Diagnosis not present

## 2016-10-04 DIAGNOSIS — I509 Heart failure, unspecified: Secondary | ICD-10-CM | POA: Diagnosis not present

## 2016-10-04 DIAGNOSIS — I110 Hypertensive heart disease with heart failure: Secondary | ICD-10-CM | POA: Diagnosis not present

## 2016-10-04 DIAGNOSIS — J449 Chronic obstructive pulmonary disease, unspecified: Secondary | ICD-10-CM | POA: Diagnosis not present

## 2016-10-04 DIAGNOSIS — I4891 Unspecified atrial fibrillation: Secondary | ICD-10-CM | POA: Diagnosis not present

## 2016-10-04 DIAGNOSIS — Z9181 History of falling: Secondary | ICD-10-CM | POA: Diagnosis not present

## 2016-10-04 MED ORDER — AMIODARONE HCL 200 MG PO TABS: ORAL_TABLET | ORAL | 0 refills | Status: DC

## 2016-10-04 MED ORDER — AMIODARONE HCL 200 MG PO TABS: 200.0000 mg | ORAL_TABLET | Freq: Every day | ORAL | 3 refills | Status: DC

## 2016-10-04 NOTE — Telephone Encounter (Signed)
Everything should be fine to perform the testing as long as she brings what she needs to empty her bag, etc.

## 2016-10-04 NOTE — Telephone Encounter (Signed)
I am sure that you have access to my medical records, but I wanted to let you know that I am wearing a Ileostomy bag. I also use a cane. The bag presents problems in itself as it has to be emptied several times a day, usually after a couple of hours. If you take in that I live about 30 min. away, I will need to empty the bag sometime during the walk or the breathing test. Please let me know if this presents a problem. ------------  Pt is concerned with being able to complete walk and breathing test due to several health concerns.  JN please advise.  Thanks!

## 2016-10-04 NOTE — Telephone Encounter (Signed)
Plan is for cardioversion next week.  She would need anticoagulation for 4 weeks post cardioversion for stroke prevention. After that she would be able to stop her anticoagulation for a short time for the procedure.  If the endoscopy can be done while anticoagulated that would not be an issue.

## 2016-10-04 NOTE — Telephone Encounter (Signed)
Scheduled DCCV 11/7.   Patient has not missed any doses of NOAC.   Starting Amiodarone this evening. DCCV instructions reviewed with patient and sent to her through [facility]. Follow up scheduled.

## 2016-10-05 ENCOUNTER — Other Ambulatory Visit: Payer: Self-pay | Admitting: Cardiology

## 2016-10-05 ENCOUNTER — Other Ambulatory Visit: Payer: Self-pay | Admitting: Pharmacist

## 2016-10-05 NOTE — Telephone Encounter (Signed)
Ok - pt to have cardioversion next week ,and will need to be anticoagulated for 4 weeks afterward. She is scheduled  for EGD with dilation  With Dr Henrene Pastor in [facility] on 10/19/2016. This will need to be rescheduled for late December . Please call pt and get procedure rescheduled . She will need to hold Eliquis (if still on it)  For 48 hours before procedure. She needs to be careful with diet - very soft food until post dilation Thanks-Amy

## 2016-10-05 NOTE — Telephone Encounter (Signed)
[name] and [name]. Please do NOT schedule this patient for endoscopy with dilation until she has clearly completed her cardiac treatment for atrial fibrillation AND is cleared by Dr. Curt Bears to come off anticoagulation for her procedure. Looking at her last upper endoscopy, she should be fine if she is careful chewing her food and takes her medications individually with plenty of water. I will cc Dr. Curt Bears on this correspondence, as well. Thank you Dr. Henrene Pastor

## 2016-10-05 NOTE — Patient Outreach (Signed)
[facility]) Care Management  [place]   10/05/2016  [name] 06/20/1941 [id number]  Subjective: [name] is a 75 y.o. female referred to pharmacy for medication management related to patient's complaint of itching, especially at night. Called and spoke with patient. HIPAA identifiers verified and verbal consent received.  [name] reports that the itching started during the time that she was in the nursing home, following her recent hospitalization. Reports that she feels itching, but has no rash or bumps. Denies changing any products such as shampoo or soap. Reports that she has used Benadryl cream, which helps temporarily, but then the itching returns. Reports has tried over the counter oral antihistamine, but has provided no relief. Denies any new medications having been started prior to or around the time that the itching began. Discuss dry skin. Patient reports that she will try using some moisturizer. Recommend Eucerin or Aquaphor. Reports that the itching has been relieved when she takes lorazepam at night as prescribed for anxiety. Reports that she has been experiencing a lot of anxiety. Patient reports that she believes that the itching might be related to her anxiety, as she reports that the anxiety and itching symptoms seem to occur together and resolve together with use of lorazepam.  Counsel patient about the importance of using caution with taking lorazepam. Discuss the risk of dizziness, sedation and falls, particularly at night. Patient verbalizes understanding and reports that she has concerns about the addiction risk with lorazepam and only wants to use it when she has to. Further caution patient to avoid taking lorazepam with other medications that can put her at increased risk of dizziness and falls, such as cyclobenzaprine.  Reports that she is getting ready to have a cardioversion next week and has been started on amiodarone yesterday.  Reports that she is following the taper dosing as directed, currently taking 400 mg twice daily. Patient expresses concern about adding this new medication. Offer to perform a medication review for the patient. Review with patient each of her medications including name, strength, directions and indication. Also review with patient her allergy list.  Patient reports that she has no further medication questions or concerns for me at this time. Provide patient with my phone number.   Objective:   Encounter Medications: Outpatient Encounter Prescriptions as of 10/05/2016  Medication Sig Note  . albuterol (PROVENTIL HFA;VENTOLIN HFA) 108 (90 BASE) MCG/ACT inhaler Inhale 1-2 puffs into the lungs every 6 (six) hours as needed for wheezing or shortness of breath.   Marland Kitchen amiodarone (PACERONE) 200 MG tablet Take 2 tablets (400 mg total) twice a day for 2 weeks.  Then take 2 tablets (400 mg total) once a day for 2 weeks.   Marland Kitchen apixaban (ELIQUIS) 5 MG TABS tablet Take 1 tablet (5 mg total) by mouth 2 (two) times daily.   Marland Kitchen azelastine (ASTELIN) 0.1 % nasal spray Place 2 sprays into the nose daily. Use in each nostril as directed (Patient taking differently: Place 2 sprays into both nostrils daily. )   . budesonide-formoterol (SYMBICORT) 160-4.5 MCG/ACT inhaler Inhale 2 puffs into the lungs 2 (two) times daily.   Marland Kitchen buPROPion (WELLBUTRIN XL) 150 MG 24 hr tablet Take 1 tablet (150 mg total) by mouth daily.   . Calcium Carbonate-Vitamin D (CALCIUM + D PO) Take 1 tablet by mouth 2 (two) times daily.    . cyclobenzaprine (FLEXERIL) 5 MG tablet Take 1 tablet (5 mg total) by mouth 3 (three) times  daily as needed for muscle spasms.   Marland Kitchen doxycycline (VIBRA-TABS) 100 MG tablet Take 1 tablet (100 mg total) by mouth 2 (two) times daily. Take 2 tablets today then 1 tablet daily till gone.   . ezetimibe (ZETIA) 10 MG tablet Take 1 tablet (10 mg total) by mouth daily.   . fluticasone (FLONASE) 50 MCG/ACT nasal spray Place 1 spray  into both nostrils 2 (two) times daily.   Marland Kitchen LORazepam (ATIVAN) 0.5 MG tablet Take 0.5 mg by mouth at bedtime. 10/05/2016: Takes as needed to help with anxiety when trying to sleep  . Melatonin 5 MG TABS Take 5 mg by mouth at bedtime.   . metoprolol succinate (TOPROL-XL) 100 MG 24 hr tablet Take 1 tablet (100 mg total) by mouth 2 (two) times daily. Take with or immediately following a meal.   . mirabegron ER (MYRBETRIQ) 50 MG TB24 tablet Take 50 mg by mouth daily.   . montelukast (SINGULAIR) 10 MG tablet Take 10 mg by mouth daily.   . Multiple Vitamin (MULTIVITAMIN) tablet Take 1 tablet by mouth daily.     Marland Kitchen nystatin (MYCOSTATIN) 100000 UNIT/ML suspension Take 5 cc by mouth, swish and swallow 4 times daily.   Marland Kitchen omeprazole (PRILOSEC) 40 MG capsule Take 1 capsule (40 mg total) by mouth daily.   . Tiotropium Bromide Monohydrate (SPIRIVA RESPIMAT) 2.5 MCG/ACT AERS Inhale 2 puffs into the lungs daily.   Marland Kitchen amiodarone (PACERONE) 200 MG tablet Take 1 tablet (200 mg total) by mouth daily. (Patient not taking: Reported on 10/05/2016)   . benzonatate (TESSALON) 100 MG capsule Take 1 capsule (100 mg total) by mouth 3 (three) times daily as needed for cough. (Patient not taking: Reported on 10/05/2016)   . furosemide (LASIX) 20 MG tablet Take 1 tablet (20 mg total) by mouth as needed (swelling). (Patient not taking: Reported on 10/05/2016)   . guaiFENesin (MUCINEX) 600 MG 12 hr tablet Take 1,200 mg by mouth 2 (two) times daily as needed for cough or to loosen phlegm.   . Lidocaine HCl 2 % CREA Apply 1 application topically daily as needed (for shingles flares).   Marland Kitchen Respiratory Therapy Supplies (FLUTTER) DEVI Use 2-3 times per day   . Spacer/Aero-Holding Chambers (AEROCHAMBER Z-STAT PLUS) inhaler Use as instructed   . [DISCONTINUED] predniSONE (DELTASONE) 10 MG tablet 30mg  for 2 days, 20mg  for 2 days, 10mg  for 1 days, then stop    No facility-administered encounter medications on file as of 10/05/2016.      Assessment:   Drugs sorted by system:  Neurologic/Psychologic: bupropion, lorazepam, melatonin  Cardiovascular: amiodarone, Eliquis, ezetimibe, furosemide, metoprolol  Pulmonary/Allergy: albuterol inhaler, Astelin, Symbicort, Spiriva, Flonase, montelukast  Gastrointestinal: omeprazole  Urinary: Myrbetriq  Pain: cyclobenzaprine  Vitamins/Minerals: multivitamin, calcium + Vitamin D  Infectious Diseases: nystatin  Duplications in therapy: none noted Gaps in therapy: none noted Medications to avoid in the elderly: lorazepam + cyclobenzaprine - Patient counseled, see above note. Drug interactions:  . Amiodarone + albuterol + Myrbetriq + Symbicort: QTc-Prolonging may enhance the QTc-prolonging effect of other QTc-Prolonging Agents. Note patient being followed by Cardiology. Per EPIC, most recent EKG performed 09/21/16. . Metoprolol + bupropion: CYP2D6 Inhibitors, such as bupropion, may increase the serum concentration of metoprolol. Patient counseled. Patient reports that she checks her blood pressure and pulse daily and reports that these numbers have been good. Denies any hypotension or dizziness. . Lorazepam + cyclobenzaprine: CNS depressants may enhance the adverse/toxic effect of other CNS depressants. Patient counseled.  Plan:  Patient counseled on findings of medication review. Will close pharmacy episode at this time.  Harlow Asa, PharmD Clinical Pharmacist [facility] [phone]

## 2016-10-06 NOTE — Telephone Encounter (Signed)
Routing to Ke[place]- make sure procedure is cancelled and ask pt to call back to talk with you to get rescheduled when she is cleared by her cardiologist Dr Shonna Chock

## 2016-10-06 NOTE — Telephone Encounter (Signed)
Patient notified. Procedures are cancelled

## 2016-10-07 DIAGNOSIS — I509 Heart failure, unspecified: Secondary | ICD-10-CM | POA: Diagnosis not present

## 2016-10-07 DIAGNOSIS — Z9181 History of falling: Secondary | ICD-10-CM | POA: Diagnosis not present

## 2016-10-07 DIAGNOSIS — J449 Chronic obstructive pulmonary disease, unspecified: Secondary | ICD-10-CM | POA: Diagnosis not present

## 2016-10-07 DIAGNOSIS — Z48815 Encounter for surgical aftercare following surgery on the digestive system: Secondary | ICD-10-CM | POA: Diagnosis not present

## 2016-10-07 DIAGNOSIS — I110 Hypertensive heart disease with heart failure: Secondary | ICD-10-CM | POA: Diagnosis not present

## 2016-10-07 DIAGNOSIS — I4891 Unspecified atrial fibrillation: Secondary | ICD-10-CM | POA: Diagnosis not present

## 2016-10-10 ENCOUNTER — Other Ambulatory Visit: Payer: Self-pay

## 2016-10-10 ENCOUNTER — Ambulatory Visit (INDEPENDENT_AMBULATORY_CARE_PROVIDER_SITE_OTHER): Payer: Medicare Other | Admitting: Pulmonary Disease

## 2016-10-10 DIAGNOSIS — R0600 Dyspnea, unspecified: Secondary | ICD-10-CM | POA: Diagnosis not present

## 2016-10-10 DIAGNOSIS — R059 Cough, unspecified: Secondary | ICD-10-CM

## 2016-10-10 DIAGNOSIS — R05 Cough: Secondary | ICD-10-CM | POA: Diagnosis not present

## 2016-10-10 DIAGNOSIS — J449 Chronic obstructive pulmonary disease, unspecified: Secondary | ICD-10-CM

## 2016-10-10 LAB — PULMONARY FUNCTION TEST
FEF 25-75 Post: 0.52 L/sec
FEF 25-75 Pre: 0.35 L/sec
FEF2575-%Change-Post: 49 %
FEF2575-%Pred-Post: 33 %
FEF2575-%Pred-Pre: 22 %
FEV1-%Change-Post: 18 %
FEV1-%Pred-Post: 53 %
FEV1-%Pred-Pre: 45 %
FEV1-Post: 1.11 L
FEV1-Pre: 0.93 L
FEV1FVC-%Change-Post: 6 %
FEV1FVC-%Pred-Pre: 65 %
FEV6-%Change-Post: 12 %
FEV6-%Pred-Post: 78 %
FEV6-%Pred-Pre: 70 %
FEV6-Post: 2.06 L
FEV6-Pre: 1.84 L
FEV6FVC-%Change-Post: 0 %
FEV6FVC-%Pred-Post: 99 %
FEV6FVC-%Pred-Pre: 99 %
FVC-%Change-Post: 11 %
FVC-%Pred-Post: 78 %
FVC-%Pred-Pre: 70 %
FVC-Post: 2.17 L
FVC-Pre: 1.94 L
Post FEV1/FVC ratio: 51 %
Post FEV6/FVC ratio: 95 %
Pre FEV1/FVC ratio: 48 %
Pre FEV6/FVC Ratio: 95 %

## 2016-10-10 NOTE — Patient Outreach (Signed)
Transition of care call: Placed call to patient on home and cell phone. No answer. Left a message requesting a call back.  PLAN: Will await a call back. Will plan another outreach in 1 week. Tomasa Rand, RN, BSN, CEN [facility] [employer] [phone]

## 2016-10-11 ENCOUNTER — Ambulatory Visit (HOSPITAL_COMMUNITY): Payer: Medicare Other | Admitting: Certified Registered Nurse Anesthetist

## 2016-10-11 ENCOUNTER — Encounter (HOSPITAL_COMMUNITY)
Admission: RE | Disposition: A | Discharge status: Home / Self Care | Payer: Self-pay | Source: Ambulatory Visit | Attending: Cardiology

## 2016-10-11 ENCOUNTER — Ambulatory Visit (HOSPITAL_COMMUNITY)
Admission: RE | Admit: 2016-10-11 | Discharge: 2016-10-11 | Disposition: A | Discharge status: Home / Self Care | Payer: Medicare Other | Source: Ambulatory Visit | Attending: Cardiology | Admitting: Cardiology

## 2016-10-11 ENCOUNTER — Encounter (HOSPITAL_COMMUNITY): Payer: Self-pay

## 2016-10-11 ENCOUNTER — Telehealth: Payer: Self-pay | Admitting: Cardiology

## 2016-10-11 DIAGNOSIS — I5042 Chronic combined systolic (congestive) and diastolic (congestive) heart failure: Secondary | ICD-10-CM | POA: Diagnosis not present

## 2016-10-11 DIAGNOSIS — Z79899 Other long term (current) drug therapy: Secondary | ICD-10-CM | POA: Diagnosis not present

## 2016-10-11 DIAGNOSIS — Z7951 Long term (current) use of inhaled steroids: Secondary | ICD-10-CM | POA: Insufficient documentation

## 2016-10-11 DIAGNOSIS — Z87891 Personal history of nicotine dependence: Secondary | ICD-10-CM | POA: Diagnosis not present

## 2016-10-11 DIAGNOSIS — I481 Persistent atrial fibrillation: Secondary | ICD-10-CM | POA: Diagnosis not present

## 2016-10-11 DIAGNOSIS — I493 Ventricular premature depolarization: Secondary | ICD-10-CM | POA: Diagnosis present

## 2016-10-11 DIAGNOSIS — Z7901 Long term (current) use of anticoagulants: Secondary | ICD-10-CM | POA: Insufficient documentation

## 2016-10-11 DIAGNOSIS — K219 Gastro-esophageal reflux disease without esophagitis: Secondary | ICD-10-CM | POA: Insufficient documentation

## 2016-10-11 DIAGNOSIS — M1990 Unspecified osteoarthritis, unspecified site: Secondary | ICD-10-CM | POA: Insufficient documentation

## 2016-10-11 DIAGNOSIS — E785 Hyperlipidemia, unspecified: Secondary | ICD-10-CM | POA: Diagnosis not present

## 2016-10-11 DIAGNOSIS — Z9049 Acquired absence of other specified parts of digestive tract: Secondary | ICD-10-CM | POA: Diagnosis not present

## 2016-10-11 DIAGNOSIS — J449 Chronic obstructive pulmonary disease, unspecified: Secondary | ICD-10-CM | POA: Insufficient documentation

## 2016-10-11 DIAGNOSIS — N183 Chronic kidney disease, stage 3 (moderate): Secondary | ICD-10-CM | POA: Insufficient documentation

## 2016-10-11 DIAGNOSIS — I4891 Unspecified atrial fibrillation: Secondary | ICD-10-CM | POA: Diagnosis not present

## 2016-10-11 DIAGNOSIS — I480 Paroxysmal atrial fibrillation: Secondary | ICD-10-CM | POA: Diagnosis not present

## 2016-10-11 DIAGNOSIS — F329 Major depressive disorder, single episode, unspecified: Secondary | ICD-10-CM | POA: Diagnosis not present

## 2016-10-11 DIAGNOSIS — I2510 Atherosclerotic heart disease of native coronary artery without angina pectoris: Secondary | ICD-10-CM | POA: Diagnosis not present

## 2016-10-11 DIAGNOSIS — K649 Unspecified hemorrhoids: Secondary | ICD-10-CM | POA: Diagnosis not present

## 2016-10-11 DIAGNOSIS — I428 Other cardiomyopathies: Secondary | ICD-10-CM | POA: Insufficient documentation

## 2016-10-11 DIAGNOSIS — I130 Hypertensive heart and chronic kidney disease with heart failure and stage 1 through stage 4 chronic kidney disease, or unspecified chronic kidney disease: Secondary | ICD-10-CM | POA: Insufficient documentation

## 2016-10-11 DIAGNOSIS — Z8673 Personal history of transient ischemic attack (TIA), and cerebral infarction without residual deficits: Secondary | ICD-10-CM | POA: Diagnosis not present

## 2016-10-11 HISTORY — PX: CARDIOVERSION: SHX1299

## 2016-10-11 LAB — POCT I-STAT, CHEM 8
BUN: 22 mg/dL — ABNORMAL HIGH (ref 6–20)
Calcium, Ion: 1.25 mmol/L (ref 1.15–1.40)
Chloride: 104 mmol/L (ref 101–111)
Creatinine, Ser: 1.3 mg/dL — ABNORMAL HIGH (ref 0.44–1.00)
Glucose, Bld: 90 mg/dL (ref 65–99)
HCT: 36 % (ref 36.0–46.0)
Hemoglobin: 12.2 g/dL (ref 12.0–15.0)
Potassium: 4.4 mmol/L (ref 3.5–5.1)
Sodium: 139 mmol/L (ref 135–145)
TCO2: 24 mmol/L (ref 0–100)

## 2016-10-11 SURGERY — CARDIOVERSION
Anesthesia: Monitor Anesthesia Care

## 2016-10-11 MED ORDER — LIDOCAINE HCL (CARDIAC) 20 MG/ML IV SOLN
INTRAVENOUS | Status: DC | PRN
  Administered 2016-10-11: 100 mg via INTRAVENOUS

## 2016-10-11 MED ORDER — PROPOFOL 10 MG/ML IV BOLUS
INTRAVENOUS | Status: DC | PRN
  Administered 2016-10-11: 50 mg via INTRAVENOUS
  Administered 2016-10-11: 30 mg via INTRAVENOUS

## 2016-10-11 MED ORDER — SODIUM CHLORIDE 0.9 % IV SOLN
INTRAVENOUS | Status: DC | PRN
Start: 2016-10-11 — End: 2016-10-11
  Administered 2016-10-11: 14:00:00 via INTRAVENOUS

## 2016-10-11 NOTE — Telephone Encounter (Signed)
Patient had successful cardioversion out of atrial fibrillation.  Will need at least one month with anticoagulation. Anticoagulation can be held for a short time after one month for procedures.

## 2016-10-11 NOTE — Progress Notes (Signed)
PFT 10/10/16: FVC 1.94 L (70%) FEV1 0.93 L (45%) FEV1/FVC 0.48 FEF 25-75 0.35 L (22%) positive bronchodilator response  6MWT 10/10/16:  Walked 96 meters / Baseline Sat 93% on RA / Nadir Sat 93% on RA @ rest (couldn't finish due to weakness & dyspnea  W/ 2:57 left)

## 2016-10-11 NOTE — Interval H&P Note (Signed)
History and Physical Interval Note:  10/11/2016 1:07 PM  [name]  has presented today for surgery, with the diagnosis of AFIB  The various methods of treatment have been discussed with the patient and family. After consideration of risks, benefits and other options for treatment, the patient has consented to  Procedure(s): CARDIOVERSION (N/A) as a surgical intervention .  The patient's history has been reviewed, patient examined, no change in status, stable for surgery.  I have reviewed the patient's chart and labs.  Questions were answered to the patient's satisfaction.     Fransico Him

## 2016-10-11 NOTE — Telephone Encounter (Signed)
Reminder in epic to contact pt is 6 weeks.

## 2016-10-11 NOTE — Anesthesia Postprocedure Evaluation (Signed)
Anesthesia Post Note  Patient: [name]  Procedure(s) Performed: Procedure(s) (LRB): CARDIOVERSION (N/A)  Patient location during evaluation: PACU Anesthesia Type: General Level of consciousness: awake and alert Pain management: pain level controlled Vital Signs Assessment: post-procedure vital signs reviewed and stable Respiratory status: spontaneous breathing, nonlabored ventilation, respiratory function stable and patient connected to nasal cannula oxygen Cardiovascular status: blood pressure returned to baseline and stable Postop Assessment: no signs of nausea or vomiting Anesthetic complications: no    Last Vitals:  Vitals:   10/11/16 1410 10/11/16 1420  BP: 114/60 118/72  Pulse: 67 67  Resp: (!) 21 20    Last Pain:  Vitals:   10/11/16 1234  TempSrc: Oral                 Claus Silvestro DAVID

## 2016-10-11 NOTE — H&P (View-Only) (Signed)
Electrophysiology Office Note   Date:  09/21/2016   ID:  [name], [name] Oct 20, 1941, MRN [id number]  PCP:  Cathlean Cower, MD  Primary Electrophysiologist:  Will Meredith Leeds, MD    Chief Complaint  Patient presents with  . New Patient (Initial Visit)    PVC's  . Dizziness    when standing up     History of Present Illness: [name] is a 75 y.o. female who presents today for electrophysiology evaluation.   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  She was referred to Dr Harrington Challenger in July 2017 after her pulmonologist noted slow HR and PVCs. A Holter monitor showed NSR, PVCs, and bigeminy but no atrial arrhythmias. Echo 06/24/16 showed her EF to be 45-50% with grade 2 DD, moderate to severe LA enlargement, and moderate MR with frequent PVCs noted. She was admitted 08/02/16 with acute resp failure/ hypoxia with newly diagnosed atrial fib RVR and CHF -> underwent DCCV with recurrence of afib. Toprol was added with plans for continued rate control and to consider antiarrhythmic for recurrent sx. She was readmitted 9/6-08/11/16 for syncope after Lasix was increased for edema with positive orthostatics. She was readmitted 9/13-9/19/17 with hemoptysis and dyspnea felt due to pulm edema/CHF. CTA of her chest ruled out PE with evidence of pneumonitis versus pulmonary congestion and worsening emphysema. She was started on steroids and BDs for COPD exacerbation. Underwent LHC showing minor nonobstructive CAD, EF 45-50%, measurement of LV pressures limited by frequent PVCs but EDP appeared normal. 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 Anticoagulation had to be interrupted for the procedure.   Today, she denies symptoms of palpitations, chest pain, shortness of breath, orthopnea, PND, lower extremity edema, claudication, dizziness, presyncope, syncope, bleeding, or neurologic sequela. The patient is tolerating medications without difficulties. She does have significant issues with fatigue and shortness of breath. Since she was diagnosed with atrial fibrillation, she says that she is able to do her daily chores, but does feel much worse at the end of the day. She does not think that it is due to her issues with her ileostomy, as it is healing well at this time.    Past Medical History:  Diagnosis Date  . Allergic rhinitis   . Anxiety   . Atrial septal aneurysm   . Basal cell carcinoma   . Bradycardia   . Cataract    bil cataracts removed  . Chronic combined systolic and diastolic CHF (congestive heart failure) ([place])   . CKD (chronic kidney disease), stage III   . COPD (chronic obstructive pulmonary disease) ([place])   . Depression 11/29/2013  . DI (detrusor instability)   . Esophageal stricture   . GERD (gastroesophageal reflux disease)   . Hemorrhoids   . Hiatal hernia   . History of kidney stones   . HLD (hyperlipidemia)   . HTN (hypertension)   . Ischemic colon ([place])    a. 08/2016 - adm to  [facility] with a ruptured colon s/p right hemicolectomy with ileostomy formation for ischemic and necrotic colon.   . Lung nodule    a. followed by  pulmonology  . Mild CAD    a. minor nonobstructive CAD 08/22/16.  . Nephrolithiasis    hx  . NICM (nonischemic cardiomyopathy) ([place])   . Orthostatic hypotension   . Osteoarthritis   . PAF (paroxysmal atrial fibrillation) ([place])    a. diagnosed 07/2016 s/p DCCV 08/01/16 with recurrence, managed with rate control for now.   . Pneumonia 2013  . PVC's (premature ventricular contractions)   . Shingles  since Nov 18, 2013   on right arm from shoulder to wrist  . Status post dilation of esophageal narrowing   . Stroke [facility]) 2001   STROKES, TIA'S  . Syncope    a. 08/2016 felt due to orthostatic hypotension.  Marland Kitchen TIA (transient ischemic attack) last 2001   anticoagulation therapy on coumadin   Past Surgical History:  Procedure Laterality Date  . ANTERIOR AND POSTERIOR VAGINAL REPAIR  2008   A&P REPAIR-DR MCDIARMID  . ANTERIOR LATERAL LUMBAR FUSION 4 LEVELS Right 07/03/2015   Procedure: Right Lumbar one-two, Lumbar two-three, Lumbar three-four, Lumbar four-five Anterior lateral lumbar interbody fusion;  Surgeon: Erline Levine, MD;  Location: [facility] NEURO ORS;  Service: Neurosurgery;  Laterality: Right;  Right L1-2 L2-3 L3-4 L4-5 Anterior lateral lumbar interbody fusion  . BALLOON DILATION N/A 03/03/2014   Procedure: BALLOON DILATION;  Surgeon: Inda Castle, MD;  Location: [facility] ENDOSCOPY;  Service: Endoscopy;  Laterality: N/A;  . BLADDER SUSPENSION    . CARDIAC CATHETERIZATION N/A 08/22/2016   Procedure: Left Heart Cath and Coronary Angiography;  Surgeon: Peter M Martinique, MD;  Location: [facility] CV LAB;  Service: Cardiovascular;  Laterality: N/A;  . CARDIOVERSION N/A 08/01/2016   Procedure: CARDIOVERSION;  Surgeon: Jerline Pain, MD;  Location: [facility] ENDOSCOPY;  Service: Cardiovascular;  Laterality: N/A;  . COLONOSCOPY    . ESOPHAGOGASTRODUODENOSCOPY N/A 03/03/2014   Procedure: ESOPHAGOGASTRODUODENOSCOPY (EGD);  Surgeon: Inda Castle, MD;  Location: [facility];  Service: Endoscopy;  Laterality: N/A;  . EXCISION OF BASAL CELL CA  2011  . GLUTEUS MINIMUS REPAIR Right 03/15/2016   Procedure: RIGHT HIP GLUTEUS MEDIUS TENDON REPAIR;  Surgeon: Paralee Cancel, MD;  Location: [facility] ORS;  Service: Orthopedics;  Laterality: Right;  . LUMBAR PERCUTANEOUS PEDICLE SCREW 4 LEVEL Right 07/03/2015   Procedure: Lumbar one-Sacral one Bilateral percutaneous pedicle screws;  Surgeon: Erline Levine, MD;  Location: [facility] NEURO  ORS;  Service: Neurosurgery;  Laterality: Right;  L1-S1 Bilateral percutaneous pedicle screws  . RECTOCELE REPAIR  2008   A&P REPAIR -DR. MCDIARMID  . THUMB SURGERY Right 10-15 yrs ago  . UPPER GASTROINTESTINAL ENDOSCOPY    . VAGINAL HYSTERECTOMY  1983   NO BSO-DR. MABRY  . VARICOSE VEIN SURGERY       Current Outpatient Prescriptions  Medication Sig Dispense Refill  . albuterol (PROVENTIL HFA;VENTOLIN HFA) 108 (90 BASE) MCG/ACT inhaler Inhale 1-2 puffs into the lungs every 6 (six) hours as needed for wheezing or shortness of breath. 3 Inhaler 4  . apixaban (ELIQUIS) 5 MG TABS tablet Take 1 tablet (5 mg total) by mouth 2 (two) times daily. 60 tablet 0  . azelastine (ASTELIN) 0.1 % nasal spray Place 2 sprays into the nose daily. Use in each nostril as directed (Patient taking differently: Place 2 sprays into both nostrils daily. ) 90 mL 1  . benzonatate (TESSALON) 100 MG capsule Take 1 capsule (100 mg total) by mouth 3 (three) times daily as needed for cough. 90 capsule 0  . budesonide-formoterol (SYMBICORT) 160-4.5 MCG/ACT inhaler Inhale 2 puffs into  the lungs 2 (two) times daily. 3 Inhaler 4  . Calcium Carbonate-Vitamin D (CALCIUM + D PO) Take 1 tablet by mouth 2 (two) times daily.     . citalopram (CELEXA) 40 MG tablet Take 1 tablet (40 mg total) by mouth daily. 90 tablet 3  . cyclobenzaprine (FLEXERIL) 5 MG tablet Take 1 tablet (5 mg total) by mouth 3 (three) times daily as needed for muscle spasms. 270 tablet 0  . ezetimibe (ZETIA) 10 MG tablet Take 1 tablet (10 mg total) by mouth daily. 90 tablet 3  . fluticasone (FLONASE) 50 MCG/ACT nasal spray Place 1 spray into both nostrils 2 (two) times daily. 48 g 1  . furosemide (LASIX) 20 MG tablet Take 1 tablet (20 mg total) by mouth as needed (swelling). 90 tablet 0  . guaiFENesin (MUCINEX) 600 MG 12 hr tablet Take 1,200 mg by mouth 2 (two) times daily as needed for cough or to loosen phlegm.    . Lidocaine HCl 2 % CREA Apply 1 application  topically daily as needed (for shingles flares).    . Melatonin 5 MG TABS Take 5 mg by mouth at bedtime.    . metoprolol succinate (TOPROL-XL) 100 MG 24 hr tablet Take 1 tablet (100 mg total) by mouth 2 (two) times daily. Take with or immediately following a meal. 60 tablet 6  . mirabegron ER (MYRBETRIQ) 50 MG TB24 tablet Take 50 mg by mouth daily.    . montelukast (SINGULAIR) 10 MG tablet Take 10 mg by mouth daily.    . Multiple Vitamin (MULTIVITAMIN) tablet Take 1 tablet by mouth daily.      Marland Kitchen omeprazole (PRILOSEC) 40 MG capsule Take 1 capsule (40 mg total) by mouth daily. 90 capsule 3  . Respiratory Therapy Supplies (FLUTTER) DEVI Use 2-3 times per day 1 each 0  . Spacer/Aero-Holding Chambers (AEROCHAMBER Z-STAT PLUS) inhaler Use as instructed 1 each 0  . Tiotropium Bromide Monohydrate (SPIRIVA RESPIMAT) 2.5 MCG/ACT AERS Inhale 2 puffs into the lungs daily. 3 Inhaler 3   No current facility-administered medications for this visit.     Allergies:   Clarithromycin; Lipitor [atorvastatin]; Simvastatin; Cardizem [diltiazem hcl]; Contrast media [iodinated diagnostic agents]; and Crestor [rosuvastatin calcium]   Social History:  The patient  reports that she quit smoking about 20 years ago. Her smoking use included Cigarettes. She started smoking about 62 years ago. She has a 126.00 pack-year smoking history. She has never used smokeless tobacco. She reports that she drinks alcohol. She reports that she does not use drugs.   Family History:  The patient's family history includes Breast cancer in her maternal aunt and mother; COPD in her mother; Emphysema in her mother; Uterine cancer in her mother.    ROS:  Please see the history of present illness.   Otherwise, review of systems is positive for none.   All other systems are reviewed and negative.    PHYSICAL EXAM: VS:  BP 90/62   Pulse 93   Ht 5\' 4"  (1.626 m)   Wt 130 lb (59 kg)   LMP  (Exact Date)   BMI 22.31 kg/m  , BMI Body mass index  is 22.31 kg/m. GEN: Well nourished, well developed, in no acute distress  HEENT: normal  Neck: no JVD, carotid bruits, or masses Cardiac: iRRR; no murmurs, rubs, or gallops,no edema  Respiratory:  clear to auscultation bilaterally, normal work of breathing GI: soft, nontender, nondistended, + BS MS: no deformity or atrophy  Skin: warm and  dry Neuro:  Strength and sensation are intact Psych: euthymic mood, full affect  EKG:  EKG is ordered today. Personal review of the ekg ordered shows atrial fibrillation, rate 93, septal infearct, PVCs  Recent Labs: 08/17/2016: B Natriuretic Peptide 343.5 09/13/2016: ALT 18; Pro B Natriuretic peptide (BNP) 202.0 09/16/2016: BUN 32; Creat 1.57; Hemoglobin 11.1; Magnesium 1.7; Platelets 346; Potassium 4.8; Sodium 139; TSH 2.72    Lipid Panel     Component Value Date/Time   CHOL 239 (H) 11/12/2015 1142   TRIG 122.0 11/12/2015 1142   HDL 49.70 11/12/2015 1142   CHOLHDL 5 11/12/2015 1142   VLDL 24.4 11/12/2015 1142   LDLCALC 165 (H) 11/12/2015 1142   LDLDIRECT 241.1 12/02/2014 1528     Wt Readings from Last 3 Encounters:  09/21/16 130 lb (59 kg)  09/16/16 128 lb 1.9 oz (58.1 kg)  09/15/16 127 lb (57.6 kg)      Other studies Reviewed: Additional studies/ records that were reviewed today include: TTE 06/24/16, Cath 08/22/16, Telemetry 06/14/16  Review of the above records today demonstrates:  TTE - Left ventricle: The cavity size was at the upper limits of   normal. Wall thickness was normal. Systolic function was mildly   reduced. The estimated ejection fraction was in the range of 45%   to 50%. Diffuse hypokinesis. Features are consistent with a   pseudonormal left ventricular filling pattern, with concomitant   abnormal relaxation and increased filling pressure (grade 2   diastolic dysfunction). - Ventricular septum: Septal motion showed paradox. These changes   are consistent with intraventricular conduction delay. - Mitral valve:  There was moderate regurgitation directed   centrally. - Left atrium: The atrium was moderately to severely dilated. - Atrial septum: The septum bowed from left to right, consistent   with increased left atrial pressure. No defect or patent foramen   ovale was identified.  Cath  Prox LAD to Mid LAD lesion, 15 %stenosed.  Ost Cx to Prox Cx lesion, 15 %stenosed.  Prox RCA to Mid RCA lesion, 20 %stenosed.  There is mild left ventricular systolic dysfunction.  LV end diastolic pressure is normal.  The left ventricular ejection fraction is 45-50% by visual estimate.   1. Minor nonobstructive CAD 2. Mild LV dysfunction with distal inferior HK 3. Measurement of LV pressures limited by frequent PVCs but EDP appears normal.   Telemetry 1. NSR with PVC's, at times in a bigeminal manner 2. Signal drop out as well as noise artifact are present.  3. No sustained atrial or ventricular arrhythmias 4. No pauses   ASSESSMENT AND PLAN:  1.  Paroxysmal atrial fibrillation: on Eliquis with Toprol XL for rate control. We'll start her on amiodarone today, as she has been compliant with her Eliquis. Due to her symptoms of fatigue and shortness of breath, we'll also plan for cardioversion. She has had one cardioversion in the past that kept her in normal rhythm for, according to her 8 hours.  This patients CHA2DS2-VASc Score and unadjusted Ischemic Stroke Rate (% per year) is equal to 7.2 % stroke rate/year from a score of 5  Above score calculated as 1 point each if present [CHF, HTN, DM, Vascular=MI/PAD/Aortic Plaque, Age if 65-74, or Female] Above score calculated as 2 points each if present [Age > 75, or Stroke/TIA/TE]  2. Chronic combined systolic HF: Currently well compensated on Toprol-XL. No ACE inhibitor due to low blood pressures.  3. HTN: Currently well controlled  4. PVCs: Plan to start amiodarone. If  PVCs continue she may benefit for 48 hour monitoring to further quantify her  number.     Current medicines are reviewed at length with the patient today.   The patient does not have concerns regarding her medicines.  The following changes were made today:  amiodarone  Labs/ tests ordered today include:  Orders Placed This Encounter  Procedures  . EKG 12-Lead     Disposition:   FU with Will Camnitz 3 months  Signed, Will Meredith Leeds, MD  09/21/2016 11:06 AM     [facility] [address] [place] [address] [phone] (office) [phone] (fax)

## 2016-10-11 NOTE — Telephone Encounter (Signed)
Please set up appt with Dr. Curt Bears or his PA in 7-10 days post DCCV

## 2016-10-11 NOTE — Anesthesia Preprocedure Evaluation (Signed)
Anesthesia Evaluation  Patient identified by MRN, date of birth, ID band Patient awake    Reviewed: Allergy & Precautions, NPO status , Patient's Chart, lab work & pertinent test results  Airway Mallampati: I  TM Distance: >3 FB Neck ROM: Full    Dental   Pulmonary former smoker,    Pulmonary exam normal        Cardiovascular hypertension, + CAD and +CHF  Normal cardiovascular exam     Neuro/Psych    GI/Hepatic GERD  Medicated and Controlled,  Endo/Other    Renal/GU Renal InsufficiencyRenal disease     Musculoskeletal   Abdominal   Peds  Hematology   Anesthesia Other Findings   Reproductive/Obstetrics                             Anesthesia Physical Anesthesia Plan  ASA: III  Anesthesia Plan: General   Post-op Pain Management:    Induction: Intravenous  Airway Management Planned: Mask  Additional Equipment:   Intra-op Plan:   Post-operative Plan:   Informed Consent: I have reviewed the patients History and Physical, chart, labs and discussed the procedure including the risks, benefits and alternatives for the proposed anesthesia with the patient or authorized representative who has indicated his/her understanding and acceptance.     Plan Discussed with: CRNA and Surgeon  Anesthesia Plan Comments:         Anesthesia Quick Evaluation

## 2016-10-11 NOTE — Telephone Encounter (Signed)
Thank you Dr. Curt Bears.  Vaughan Basta, Please contact this patient in about 6 weeks and see how she is doing. If doing well, we will arrange her therapeutic upper endoscopy with dilation. Dr. Henrene Pastor

## 2016-10-11 NOTE — Discharge Instructions (Signed)
Electrical Cardioversion, Care After °Refer to this sheet in the next few weeks. These instructions provide you with information on caring for yourself after your procedure. Your health care provider may also give you more specific instructions. Your treatment has been planned according to current medical practices, but problems sometimes occur. Call your health care provider if you have any problems or questions after your procedure. °WHAT TO EXPECT AFTER THE PROCEDURE °After your procedure, it is typical to have the following sensations: °· Some redness on the skin where the shocks were delivered. If this is tender, a sunburn lotion or hydrocortisone cream may help. °· Possible return of an abnormal heart rhythm within hours or days after the procedure. °HOME CARE INSTRUCTIONS °· Take medicines only as directed by your health care provider. Be sure you understand how and when to take your medicine. °· Learn how to feel your pulse and check it often. °· Limit your activity for 48 hours after the procedure or as directed by your health care provider. °· Avoid or minimize caffeine and other stimulants as directed by your health care provider. °SEEK MEDICAL CARE IF: °· You feel like your heart is beating too fast or your pulse is not regular. °· You have any questions about your medicines. °· You have bleeding that will not stop. °SEEK IMMEDIATE MEDICAL CARE IF: °· You are dizzy or feel faint. °· It is hard to breathe or you feel short of breath. °· There is a change in discomfort in your chest. °· Your speech is slurred or you have trouble moving an arm or leg on one side of your body. °· You get a serious muscle cramp that does not go away. °· Your fingers or toes turn cold or blue. °  °This information is not intended to replace advice given to you by your health care provider. Make sure you discuss any questions you have with your health care provider. °  °Document Released: 09/11/2013 Document Revised: 12/12/2014  Document Reviewed: 09/11/2013 °Elsevier Interactive Patient Education ©2016 [employer] ° °

## 2016-10-11 NOTE — Transfer of Care (Signed)
Immediate Anesthesia Transfer of Care Note  Patient: [name]  Procedure(s) Performed: Procedure(s): CARDIOVERSION (N/A)  Patient Location: Endoscopy Unit  Anesthesia Type:General  Level of Consciousness: awake, alert  and oriented  Airway & Oxygen Therapy: Patient Spontanous Breathing and Patient connected to nasal cannula oxygen  Post-op Assessment: Report given to RN and Post -op Vital signs reviewed and stable  Post vital signs: Reviewed and stable  Last Vitals:  Vitals:   10/11/16 1400 10/11/16 1401  BP:  121/68  Pulse: 64 62  Resp: (!) 21 20    Last Pain:  Vitals:   10/11/16 1234  TempSrc: Oral         Complications: No apparent anesthesia complications

## 2016-10-11 NOTE — CV Procedure (Signed)
   Electrical Cardioversion Procedure Note [name] [id number] 22-Mar-1941  Procedure: Electrical Cardioversion Indications:  Atrial Fibrillation  Time Out: Verified patient identification, verified procedure,medications/allergies/relevent history reviewed, required imaging and test results available.  Performed  Procedure Details  The patient was NPO after midnight. Anesthesia was administered at the beside  by [facility] with 80mg  of propofol and 100mg  Lidocaine.  Cardioversion was done with synchronized biphasic defibrillation with AP pads with 150watts.  The patient did not convert to normal sinus rhythm. Cardioversion was done with synchronized biphasic defibrillation with AP pads with 200watts.  The patient did not convert to normal sinus.  Cardioversion was again done with synchronized biphasic defibrillation with AP pads with 200watts.  The patient  converted to normal sinus.  The patient tolerated the procedure well   IMPRESSION:  Successful cardioversion of atrial fibrillation    Traci Turner 10/11/2016, 1:08 PM

## 2016-10-12 ENCOUNTER — Telehealth: Payer: Self-pay | Admitting: Cardiology

## 2016-10-12 NOTE — Telephone Encounter (Signed)
Patient calls in concerned -- states she had DCCV yesterday and they asked her if she had missed any NOAC doses -- she responded that she had not.  Reports that when she got home and went to take her evening meds, she noticed that she had missed several doses of Eliquis but did not know until that moment.  She states, "I am so sorry.  What have I messed up?".  Advised patient that she had not "messed anything up".  Reviewed reasoning behind not missing doses prior to DCCV and explained why/possible consequences. Further explained that now she has had her procedure this should not be a concern anymore. However, advised pt to monitor and report signs of sudden SOB, CP and/or stroke symptoms. She understands I will verbally discuss w/ Dr. Curt Bears and call her with new recommendations/stay on current plan. Patient verbalized understanding and agreeable to plan.

## 2016-10-12 NOTE — Telephone Encounter (Signed)
New Message  Pt call requesting to speak with RN. Pt states she had a cardioversion on 11/7. And needs to speak with RN as soon as possible. Please call back to discuss

## 2016-10-13 ENCOUNTER — Other Ambulatory Visit: Payer: Self-pay

## 2016-10-13 ENCOUNTER — Encounter (HOSPITAL_COMMUNITY): Payer: Self-pay | Admitting: Cardiology

## 2016-10-13 NOTE — Telephone Encounter (Signed)
Advised to stay on current plan. Patient verbalized understanding and agreeable to plan.

## 2016-10-13 NOTE — Patient Outreach (Signed)
Transition of care call: Patient returned call and states that she is doing well. Reports feeling more energy since cardioversion this week. Denies any changes in weight. States that she needs a follow up with cardiology 3 days post cardioversion per Dr. Radford Pax. Reviewed Dr. Theodosia Blender note that indicates 7-10 days. Informed patient. Patient requested I call and get her an appointment.  Placed call to [facility] heart care and secured appointment for 10/21/2016 at [place] call back to patient to inform. Patient verified appointment date and time.  PLAN: Will continue weekly transition of care calls  Tomasa Rand, RN, BSN, [facility] [employer] [phone]

## 2016-10-13 NOTE — Patient Outreach (Signed)
Care coordination: Received call from patient to change transition of care call planned for Monday due to her husband had an appointment.  Called patient back. No answer. Left a message on machine.  Placed call to cell phone and call dropped.  PLAN: will outreach patient next week.  Cancelled 10/17/2016 telephone call.  Tomasa Rand, RN, BSN, CEN [place] [employer] [phone]

## 2016-10-14 DIAGNOSIS — J449 Chronic obstructive pulmonary disease, unspecified: Secondary | ICD-10-CM | POA: Diagnosis not present

## 2016-10-14 DIAGNOSIS — I110 Hypertensive heart disease with heart failure: Secondary | ICD-10-CM | POA: Diagnosis not present

## 2016-10-14 DIAGNOSIS — I509 Heart failure, unspecified: Secondary | ICD-10-CM | POA: Diagnosis not present

## 2016-10-14 DIAGNOSIS — Z9181 History of falling: Secondary | ICD-10-CM | POA: Diagnosis not present

## 2016-10-14 DIAGNOSIS — Z48815 Encounter for surgical aftercare following surgery on the digestive system: Secondary | ICD-10-CM | POA: Diagnosis not present

## 2016-10-14 DIAGNOSIS — I4891 Unspecified atrial fibrillation: Secondary | ICD-10-CM | POA: Diagnosis not present

## 2016-10-16 ENCOUNTER — Encounter: Payer: Self-pay | Admitting: Internal Medicine

## 2016-10-17 ENCOUNTER — Ambulatory Visit: Payer: Medicare Other | Admitting: Physician Assistant

## 2016-10-17 ENCOUNTER — Other Ambulatory Visit: Payer: Self-pay

## 2016-10-17 ENCOUNTER — Ambulatory Visit: Payer: Medicare Other

## 2016-10-17 ENCOUNTER — Encounter: Payer: Self-pay | Admitting: Cardiology

## 2016-10-17 ENCOUNTER — Ambulatory Visit (INDEPENDENT_AMBULATORY_CARE_PROVIDER_SITE_OTHER): Payer: Medicare Other | Admitting: Physician Assistant

## 2016-10-17 ENCOUNTER — Telehealth: Payer: Self-pay | Admitting: Internal Medicine

## 2016-10-17 VITALS — BP 108/58 | HR 76 | Ht 64.0 in | Wt 132.8 lb

## 2016-10-17 DIAGNOSIS — I10 Essential (primary) hypertension: Secondary | ICD-10-CM

## 2016-10-17 DIAGNOSIS — I5042 Chronic combined systolic (congestive) and diastolic (congestive) heart failure: Secondary | ICD-10-CM | POA: Diagnosis not present

## 2016-10-17 DIAGNOSIS — I493 Ventricular premature depolarization: Secondary | ICD-10-CM

## 2016-10-17 DIAGNOSIS — R9431 Abnormal electrocardiogram [ECG] [EKG]: Secondary | ICD-10-CM

## 2016-10-17 DIAGNOSIS — I480 Paroxysmal atrial fibrillation: Secondary | ICD-10-CM | POA: Diagnosis not present

## 2016-10-17 MED ORDER — METOPROLOL SUCCINATE ER 100 MG PO TB24: 100.0000 mg | ORAL_TABLET | Freq: Two times a day (BID) | ORAL | 3 refills | Status: DC

## 2016-10-17 NOTE — Progress Notes (Signed)
Cardiology Office Note    Date:  10/17/2016   ID:  Jannelly, [place] [birth year], MRN [id number]  PCP:  Cathlean Cower, MD  Cardiologist:  Dr. Harrington Challenger Electrophysiologist: Dr. Curt Bears  Chief Complaint: DCCV  follow up  History of Present Illness:   [name] is a 75 y.o. 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    She was referred to Dr Harrington Challenger in July 2017 after her pulmonologist noted slow HR and PVCs. A Holter monitor showed NSR, PVCs, and bigeminy but no atrial arrhythmias. Echo 06/24/16 showed her EF to be 45-50% with grade 2 DD, moderate to severe LA enlargement, and moderate MR with frequent PVCs noted. She was admitted 08/02/16 with acute resp failure/ hypoxia with newly diagnosed atrial fib RVR and CHF -> underwent DCCV with recurrence of afib. Toprol was added with plans for continued rate control and to consider antiarrhythmic for recurrent sx. She was readmitted 9/6-08/11/16 for syncope after Lasix was increased for edema with positive orthostatics. She was readmitted 9/13-9/19/17 with hemoptysis and dyspnea felt due to pulm edema/CHF. CTA of her chest ruled out PE with evidence of pneumonitis versus pulmonary congestion and worsening emphysema. She was started on steroids and BDs for COPD exacerbation. Underwent LHC showing minor nonobstructive CAD, EF 45-50%, measurement of LV pressures limited by frequent PVCs but EDP appeared normal. he was readmitted 2 days after discharge to [facility] with a ruptured colon s/p right hemicolectomy with ileostomy formation for ischemic and necrotic  colon. Anticoagulation had to be interrupted for the procedure.  She was evaluated by Dr. Curt Bears 09/21/16 for symptomatic atrial fibrillation. She was started on amiodarone. S/p Successful cardioversion 10/11/16. She will need uninterrupted anticoagulation for at least one month. Per telephone note 10/12/16, Pt might have missed few dose of Eliquis prior to DCCV.   Here today for follow up. She has noted big difference since restoration of sinus rhythm. Her energy level has improved. she denies symptoms of palpitations, chest pain, shortness of breath, orthopnea, PND, lower extremity edema, claudication, dizziness, presyncope, syncope, bleeding, or neurologic sequela. She is concern about timing to Endoscopy with Dr. Henrene Pastor (need to hold Eliquis for 2 days prior to procedure) and afterwards reversal of colostomy with R groin hernia repair (enlargining). She will start taking Amiodarone 400mg  qd from tomorrow.   Past Medical History:  Diagnosis Date  . Allergic rhinitis   . Anxiety   . Atrial septal aneurysm   . Basal cell carcinoma   . Bradycardia   . Cataract    bil cataracts removed  . Chronic combined systolic and diastolic CHF (congestive heart failure) ([place])   . CKD (chronic kidney disease), stage III   . COPD (chronic obstructive pulmonary disease) ([place])   . Depression 11/29/2013  . DI (detrusor instability)   . Esophageal stricture   . GERD (gastroesophageal reflux disease)   . Hemorrhoids   . Hiatal hernia   . History of kidney stones   . HLD (hyperlipidemia)   . HTN (hypertension)   . Ischemic colon ([place])    a. 08/2016 - adm to  [facility] with a ruptured colon s/p right  hemicolectomy with ileostomy formation for ischemic and necrotic colon.   . Lung nodule    a. followed by pulmonology  . Mild CAD    a. minor nonobstructive CAD 08/22/16.  . Nephrolithiasis    hx  . NICM (nonischemic cardiomyopathy) ([place])   . Orthostatic hypotension   . Osteoarthritis   . PAF  (paroxysmal atrial fibrillation) ([place])    a. diagnosed 07/2016 s/p DCCV 08/01/16 with recurrence, managed with rate control for now.   . Pneumonia 2013  . PVC's (premature ventricular contractions)   . Shingles since Nov 18, 2013   on right arm from shoulder to wrist  . Status post dilation of esophageal narrowing   . Stroke [facility]) 2001   STROKES, TIA'S  . Syncope    a. 08/2016 felt due to orthostatic hypotension.  Marland Kitchen TIA (transient ischemic attack) last 2001   anticoagulation therapy on coumadin    Past Surgical History:  Procedure Laterality Date  . ANTERIOR AND POSTERIOR VAGINAL REPAIR  2008   A&P REPAIR-DR MCDIARMID  . ANTERIOR LATERAL LUMBAR FUSION 4 LEVELS Right 07/03/2015   Procedure: Right Lumbar one-two, Lumbar two-three, Lumbar three-four, Lumbar four-five Anterior lateral lumbar interbody fusion;  Surgeon: Erline Levine, MD;  Location: [facility] NEURO ORS;  Service: Neurosurgery;  Laterality: Right;  Right L1-2 L2-3 L3-4 L4-5 Anterior lateral lumbar interbody fusion  . BALLOON DILATION N/A 03/03/2014   Procedure: BALLOON DILATION;  Surgeon: Inda Castle, MD;  Location: [facility] ENDOSCOPY;  Service: Endoscopy;  Laterality: N/A;  . BLADDER SUSPENSION    . CARDIAC CATHETERIZATION N/A 08/22/2016   Procedure: Left Heart Cath and Coronary Angiography;  Surgeon: Peter M Martinique, MD;  Location: [place] CV LAB;  Service: Cardiovascular;  Laterality: N/A;  . CARDIOVERSION N/A 08/01/2016   Procedure: CARDIOVERSION;  Surgeon: Jerline Pain, MD;  Location: [place];  Service: Cardiovascular;  Laterality: N/A;  . CARDIOVERSION N/A 10/11/2016   Procedure: CARDIOVERSION;  Surgeon: Sueanne Margarita, MD;  Location: [facility] ENDOSCOPY;  Service: Cardiovascular;  Laterality: N/A;  . COLONOSCOPY    . ESOPHAGOGASTRODUODENOSCOPY N/A 03/03/2014   Procedure: ESOPHAGOGASTRODUODENOSCOPY (EGD);  Surgeon: Inda Castle, MD;  Location: [facility];  Service: Endoscopy;  Laterality: N/A;  . EXCISION OF BASAL CELL CA  2011    . GLUTEUS MINIMUS REPAIR Right 03/15/2016   Procedure: RIGHT HIP GLUTEUS MEDIUS TENDON REPAIR;  Surgeon: Paralee Cancel, MD;  Location: [facility] ORS;  Service: Orthopedics;  Laterality: Right;  . LUMBAR PERCUTANEOUS PEDICLE SCREW 4 LEVEL Right 07/03/2015   Procedure: Lumbar one-Sacral one Bilateral percutaneous pedicle screws;  Surgeon: Erline Levine, MD;  Location: [place] NEURO ORS;  Service: Neurosurgery;  Laterality: Right;  L1-S1 Bilateral percutaneous pedicle screws  . RECTOCELE REPAIR  2008   A&P REPAIR -DR. MCDIARMID  . THUMB SURGERY Right 10-15 yrs ago  . UPPER GASTROINTESTINAL ENDOSCOPY    . VAGINAL HYSTERECTOMY  1983   NO BSO-DR. MABRY  . VARICOSE VEIN SURGERY      Current Medications: Prior to Admission medications   Medication Sig Start Date End Date Taking? Authorizing Provider  albuterol (PROVENTIL HFA;VENTOLIN HFA) 108 (90 BASE) MCG/ACT inhaler Inhale 1-2 puffs into the lungs every 6 (six) hours as needed for wheezing or shortness of breath. 01/06/15   Elsie Stain, MD  amiodarone (PACERONE) 200 MG tablet Take 2 tablets (400 mg total) twice a day for 2 weeks.  Then take 2 tablets (400 mg total) once a day for 2 weeks. 10/04/16   Will Hassell Done  Camnitz, MD  amiodarone (PACERONE) 200 MG tablet Take 1 tablet (200 mg total) by mouth daily. Patient not taking: Reported on 10/05/2016 10/04/16   Will Meredith Leeds, MD  apixaban (ELIQUIS) 5 MG TABS tablet Take 1 tablet (5 mg total) by mouth 2 (two) times daily. 08/02/16   Eugenie Filler, MD  azelastine (ASTELIN) 0.1 % nasal spray Place 2 sprays into the nose daily. Use in each nostril as directed Patient taking differently: Place 2 sprays into both nostrils daily.  03/01/16   Javier Glazier, MD  benzonatate (TESSALON) 100 MG capsule Take 1 capsule (100 mg total) by mouth 3 (three) times daily as needed for cough. 03/22/16   Tammy S Parrett, NP  budesonide-formoterol (SYMBICORT) 160-4.5 MCG/ACT inhaler Inhale 2 puffs into the lungs 2 (two) times  daily. 03/01/16   Javier Glazier, MD  buPROPion (WELLBUTRIN XL) 150 MG 24 hr tablet Take 1 tablet (150 mg total) by mouth daily. 09/23/16   Biagio Borg, MD  Calcium Carbonate-Vitamin D (CALCIUM + D PO) Take 1 tablet by mouth 2 (two) times daily.     Historical Provider, MD  cyclobenzaprine (FLEXERIL) 5 MG tablet Take 1 tablet (5 mg total) by mouth 3 (three) times daily as needed for muscle spasms. 08/19/15   Biagio Borg, MD  doxycycline (VIBRA-TABS) 100 MG tablet Take 1 tablet (100 mg total) by mouth 2 (two) times daily. Take 2 tablets today then 1 tablet daily till gone. 09/29/16   Magdalen Spatz, NP  ezetimibe (ZETIA) 10 MG tablet Take 1 tablet (10 mg total) by mouth daily. 07/26/16 10/24/16  Fay Records, MD  fluticasone (FLONASE) 50 MCG/ACT nasal spray Place 1 spray into both nostrils 2 (two) times daily. 12/22/15   Javier Glazier, MD  furosemide (LASIX) 20 MG tablet Take 1 tablet (20 mg total) by mouth as needed (swelling). Patient not taking: Reported on 10/11/2016 09/16/16   Dayna N Dunn, PA-C  guaiFENesin (MUCINEX) 600 MG 12 hr tablet Take 1,200 mg by mouth 2 (two) times daily as needed for cough or to loosen phlegm.    Historical Provider, MD  Lidocaine HCl 2 % CREA Apply 1 application topically daily as needed (for shingles flares).    Historical Provider, MD  LORazepam (ATIVAN) 0.5 MG tablet Take 0.5 mg by mouth at bedtime.    Historical Provider, MD  Melatonin 5 MG TABS Take 5 mg by mouth at bedtime.    Historical Provider, MD  metoprolol succinate (TOPROL-XL) 100 MG 24 hr tablet Take 1 tablet (100 mg total) by mouth 2 (two) times daily. Take with or immediately following a meal. 09/16/16   Dayna N Dunn, PA-C  mirabegron ER (MYRBETRIQ) 50 MG TB24 tablet Take 50 mg by mouth daily.    Historical Provider, MD  montelukast (SINGULAIR) 10 MG tablet Take 10 mg by mouth daily.    Historical Provider, MD  Multiple Vitamin (MULTIVITAMIN) tablet Take 1 tablet by mouth daily.      Historical  Provider, MD  nystatin (MYCOSTATIN) 100000 UNIT/ML suspension Take 5 cc by mouth, swish and swallow 4 times daily. 09/29/16   Amy S Esterwood, PA-C  omeprazole (PRILOSEC) 40 MG capsule Take 1 capsule (40 mg total) by mouth daily. 04/22/16   Irene Shipper, MD  Respiratory Therapy Supplies (FLUTTER) DEVI Use 2-3 times per day 01/06/15   Elsie Stain, MD  Spacer/Aero-Holding Chambers (AEROCHAMBER Z-STAT PLUS) inhaler Use as instructed 12/22/15   Javier Glazier,  MD  Tiotropium Bromide Monohydrate (SPIRIVA RESPIMAT) 2.5 MCG/ACT AERS Inhale 2 puffs into the lungs daily. 02/19/16   Javier Glazier, MD    Allergies:   Clarithromycin; Lipitor [atorvastatin]; Simvastatin; Cardizem [diltiazem hcl]; Contrast media [iodinated diagnostic agents]; and Crestor [rosuvastatin calcium]   Social History   Social History  . Marital status: Married    Spouse name: N/A  . Number of children: 2  . Years of education: N/A   Occupational History  . retired Retired   Social History Main Topics  . Smoking status: Former Smoker    Packs/day: 3.00    Years: 42.00    Types: Cigarettes    Start date: 08/04/1954    Quit date: 12/06/1995  . Smokeless tobacco: Never Used  . Alcohol use No     Comment: occas very little  . Drug use: No  . Sexual activity: Not Currently    Birth control/ protection: Surgical   Other Topics Concern  . Not on file   Social History Narrative   Retired- Dentist Pulmonary:   Originally from [place]. Has always lived in [place]. She has traveled to [place], [place], [place], [place], [place], [place]. Has been on multiple cruises. 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 She did use a face mask consistently to avoid chemical fume exposure. She may have only had an inhaled exposure a couple of times. She currently has a dog. Remote exposure to a parakeet. No mold or hot tub exposure.  No asbestos exposure.      Family History:  The patient's family history includes Breast cancer in her maternal aunt and mother; COPD in her mother; Emphysema in her mother; Uterine cancer in her mother.   ROS:   Please see the history of present illness.    ROS All other systems reviewed and are negative.   PHYSICAL EXAM:   VS:  BP (!) 108/58   Pulse 76   Ht 5\' 4"  (1.626 m)   Wt 132 lb 12 oz (60.2 kg)   LMP  (Exact Date)   BMI 22.79 kg/m    GEN: Well nourished, well developed, in no acute distress  HEENT: normal  Neck: no JVD, carotid bruits, or masses Cardiac: RR; no murmurs, rubs, or gallops,no edema  Respiratory:  clear to auscultation bilaterally, normal work of breathing GI: soft,  + BS. R groin hernia with colostomy bag MS: no deformity or atrophy  Skin: warm and dry, no rash Neuro:  Alert and Oriented x 3, Strength and sensation are intact Psych: euthymic mood, full affect  Wt Readings from Last 3 Encounters:  10/17/16 132 lb 12 oz (60.2 kg)  10/11/16 130 lb (59 kg)  10/03/16 130 lb (59 kg)      Studies/Labs Reviewed:   EKG:  EKG is ordered today.  The ekg ordered today demonstrates sinus rhythm with PR interval of 221ms with QTC of 566ms.   Recent Labs: 08/17/2016: B Natriuretic Peptide 343.5 09/13/2016: ALT 18; Pro B Natriuretic peptide (BNP) 202.0 09/16/2016: Magnesium 1.7; Platelets 346; TSH 2.72 10/11/2016: BUN 22; Creatinine, Ser 1.30; Hemoglobin 12.2; Potassium 4.4; Sodium 139   Lipid Panel    Component Value Date/Time   CHOL 239 (H) 11/12/2015 1142   TRIG 122.0 11/12/2015 1142   HDL 49.70 11/12/2015 1142   CHOLHDL 5 11/12/2015 1142   VLDL 24.4 11/12/2015 1142   LDLCALC 165 (H)  11/12/2015 1142   LDLDIRECT 241.1 12/02/2014 1528    Additional studies/ records that were reviewed today include:   Echocardiogram: 06/24/16 LV EF: 45% -   50%  ------------------------------------------------------------------- Indications:      SOB  (R06.02).  ------------------------------------------------------------------- History:   PMH:  Bradycardia with fatigue. Acquired from the patient and from the patient&'s chart.  Tachycardia.  Ventricular ectopy.  Stroke.  Chronic obstructive pulmonary disease.  Risk factors:  Dyslipidemia.  ------------------------------------------------------------------- Study Conclusions  - Left ventricle: The cavity size was at the upper limits of   normal. Wall thickness was normal. Systolic function was mildly   reduced. The estimated ejection fraction was in the range of 45%   to 50%. Diffuse hypokinesis. Features are consistent with a   pseudonormal left ventricular filling pattern, with concomitant   abnormal relaxation and increased filling pressure (grade 2   diastolic dysfunction). - Ventricular septum: Septal motion showed paradox. These changes   are consistent with intraventricular conduction delay. - Mitral valve: There was moderate regurgitation directed   centrally. - Left atrium: The atrium was moderately to severely dilated. - Atrial septum: The septum bowed from left to right, consistent   with increased left atrial pressure. No defect or patent foramen   ovale was identified.  Impressions:  - Incessant PVCs seen throughout the study.  Cardiac Catheterization: 08/22/16 Left Heart Cath and Coronary Angiography  Conclusion     Prox LAD to Mid LAD lesion, 15 %stenosed.  Ost Cx to Prox Cx lesion, 15 %stenosed.  Prox RCA to Mid RCA lesion, 20 %stenosed.  There is mild left ventricular systolic dysfunction.  LV end diastolic pressure is normal.  The left ventricular ejection fraction is 45-50% by visual estimate.   1. Minor nonobstructive CAD 2. Mild LV dysfunction with distal inferior HK 3. Measurement of LV pressures limited by frequent PVCs but EDP appears normal.        ASSESSMENT & PLAN:    1. PAF - Maintaining sinus rhythm. She has been feeling  more energetic now. She will start taking Amiodarone 400mg  qd from tomorrow. EKG today showed sinus rhythm with PR interval of 28ms and QTc of 539ms. Qtc of 440ms on 10/11/16.  - Continue Troprol XL 100mg  BID. Will sent message to Dr. Curt Bears for prolonged QTc if needs to lower amiodarone further. She is compliant with Eliquis post DCCV. She is concern about enlarging R groin hernia and there is concern that she might not able to make up until 1 month post cardioversion for uninterrupted cardioversion.  - She need to have endoscopy with Dr. Henrene Pastor (need to hold Eliquis for 2 days prior to procedure) and afterwards reversal of colostomy with R groin hernia repair (enlargining) with Dr. Governor Rooks at [facility].   2. QT prolongation - as above  3. Chronic combined CHF - Euvolemic. No change  4. HTN - Stable and controlled on current regimen.   5. PVC - Plan to get 48 hours monitor if no improvement.     Medication Adjustments/Labs and Tests Ordered: Current medicines are reviewed at length with the patient today.  Concerns regarding medicines are outlined above.  Medication changes, Labs and Tests ordered today are listed in the Patient Instructions below. Patient Instructions  Your physician has recommended you make the following change in your medication:     Your physician recommends that you schedule a follow-up appointment in:  [place], Leanor Kail, PA  10/17/2016 3:06 PM    [facility] [address], [place], [place]  [address] Phone: [phone]; Fax: [phone]

## 2016-10-17 NOTE — Patient Instructions (Addendum)
Your physician has recommended you make the following change in your medication:     Your physician recommends that you schedule a follow-up appointment in:  [place]

## 2016-10-17 NOTE — Telephone Encounter (Signed)
Pts husband states they see cardiology today for clearance. Pt has ileostomy and a hernia that will need to be repaired when reversal of ostomy done. Pt wants to know how soon she can be scheduled for procedure. Discussed with husband it will depend if her procedure can be done in the [facility] or if it has to be done in the hospital. Pt will call back after cardiology visit.

## 2016-10-17 NOTE — Patient Outreach (Addendum)
Final transition of care: Patient called and left me a voice mail about assisting her with MD appointment.  4:27pm.  I returned call and patient states that she saw cardiologist today. Reports that she has a hernia that is increasing in size at the ileostomy stoma site. Reports that she has to on anticoagulants for 1 month post cardioversion.  States that she needs and endo prior to reversal of ostomy.  Patient reports that she was able to get appointments arranged herself today.  Pending surgeon appointment on 10/20/2016.  Earliest possible surgery 11/10/2016  Reviewed with patient that she has successfully completed transition of care program.  Reviewed options for moving forward. Patient reports that she thinks she needs continued guidance until after she can get her ileostomy reversed.   Will completed transition of care program open new program for CHF case management.   Plan: Will contact patient via phone in 2 weeks and patient will call if needed.  This is a complexed patient with frequent admissions due to complications.    [facility] CM Care Plan Problem One   Flowsheet Row Most Recent Value  Care Plan Problem One  Recent admission for ileostomy and abdominal surgery.  Role Documenting the Problem One  Care Management [place] for Problem One  Active  [facility] Long Term Goal (31-90 days)  Patient will report no readmissions for the next 31 days.  [facility] Long Term Goal Start Date  09/15/16  [facility] Long Term Goal Met Date  10/17/16  Interventions for Problem One Long Term Goal  Placed call to cardiology to secure MD follow up for cardioversion.   [facility] CM Short Term Goal #1 (0-30 days)  Patient will report feeling stronger and having improved weight gain in the next 30 days.  [facility] CM Short Term Goal #1 Start Date  09/15/16  [facility] CM Short Term Goal #1 Met Date  10/17/16  Interventions for Short Term Goal #1  Encouraged patient to continue to eat and drink well.  Encouraged patient to call MD  for any concerns.  [facility] CM Short Term Goal #2 (0-30 days)  Patient will report weighing daily for the next 30 days.  [facility] CM Short Term Goal #2 Start Date  09/15/16  [facility] CM Short Term Goal #2 Met Date  10/17/16  Interventions for Short Term Goal #2  Confirmed with patient that she continues to [place] daily. [place] provided.   [facility] CM Short Term Goal #3 (0-30 days)  Patient will report navigating the health system in order to avoid hospital admissions.   [facility] CM Short Term Goal #3 Start Date  10/17/16  Interventions for Short Tern Goal #3  Encouraged patient to attempt to get all appointments made. if she struggles to call case manager.      Tomasa Rand, RN, BSN, CEN [facility] [employer] [phone]

## 2016-10-17 NOTE — Telephone Encounter (Signed)
Left message for pt to call back  °

## 2016-10-18 ENCOUNTER — Ambulatory Visit (INDEPENDENT_AMBULATORY_CARE_PROVIDER_SITE_OTHER): Payer: Medicare Other | Admitting: Pulmonary Disease

## 2016-10-18 ENCOUNTER — Encounter: Payer: Self-pay | Admitting: Pulmonary Disease

## 2016-10-18 VITALS — BP 124/64 | HR 57 | Ht 63.0 in | Wt 133.0 lb

## 2016-10-18 DIAGNOSIS — R911 Solitary pulmonary nodule: Secondary | ICD-10-CM

## 2016-10-18 DIAGNOSIS — K219 Gastro-esophageal reflux disease without esophagitis: Secondary | ICD-10-CM | POA: Diagnosis not present

## 2016-10-18 DIAGNOSIS — J449 Chronic obstructive pulmonary disease, unspecified: Secondary | ICD-10-CM

## 2016-10-18 DIAGNOSIS — IMO0001 Reserved for inherently not codable concepts without codable children: Secondary | ICD-10-CM

## 2016-10-18 DIAGNOSIS — J309 Allergic rhinitis, unspecified: Secondary | ICD-10-CM | POA: Diagnosis not present

## 2016-10-18 DIAGNOSIS — K449 Diaphragmatic hernia without obstruction or gangrene: Secondary | ICD-10-CM

## 2016-10-18 MED ORDER — LORAZEPAM 0.5 MG PO TABS: 0.5000 mg | ORAL_TABLET | Freq: Every day | ORAL | 5 refills | Status: DC

## 2016-10-18 NOTE — Patient Instructions (Signed)
   Continue using your inhalers as prescribed.  Call me if you have any new breathing problems before your next appointment.  I will see you back after your chest CT scan in February or sooner if needed.

## 2016-10-18 NOTE — Progress Notes (Signed)
Subjective:    Patient ID: [name], female    DOB: December 06, 1940, 75 y.o.   MRN: [id number]  C.C.: Follow-up for Moderate COPD, Right Middle Lobe Nodule, Chronic Allergic Rhinitis, & GERD.  HPI  Moderate COPD:  Prescribed Symbicort & Spiriva. She reports her dyspnea and energy have improved post cardioversion. She denies any coughing or wheezing. She reports rare need for her rescue medication. No exacerbations since last appointment. Reports she is compliant with her inhaler regimen.   Right Middle Lobe Nodule:Measures 4 mm and seen on CT angiogram September 2017. Planned for repeat CT imaging in February 2017.   Chronic Allergic Rhinitis:  On Singulair, Astelin, & Flonase. She reports she has had increased pain in her left nare. Denies any sinus congestion or facial pain.   GERD w/ Hiatal Hernia:  She is following with Dr. Kathrine Cords. Prescribed Prilosec. No reflux or dyspepsia. No morning brash water taste.   Review of Systems  No fever or sweats. Does have intermittent chills. She reports some rare chest discomfort but no pain or tightness currently.   Allergies  Allergen Reactions  . Clarithromycin Other (See Comments)    REACTION: possible rash but could have been legionarres dz with rash  . Lipitor [Atorvastatin] Other (See Comments)    MUSCLE SPASMS  . Simvastatin Other (See Comments)    Muscle and joint pain  . Cardizem [Diltiazem Hcl] Hives  . Contrast Media [Iodinated Diagnostic Agents]     FYI - during admission at [facility] 08/2016 patient developed hives, question related to cardizem or contrast dye - not clear  . Crestor [Rosuvastatin Calcium]     fagitue and joint pain, "NO STATINS"    Current Outpatient Prescriptions on File Prior to Visit  Medication Sig Dispense Refill  . albuterol (PROVENTIL HFA;VENTOLIN HFA) 108 (90 BASE) MCG/ACT inhaler Inhale 1-2 puffs into the lungs every 6 (six) hours as needed for wheezing or shortness of breath. 3 Inhaler 4  .  amiodarone (PACERONE) 200 MG tablet Take 1 tablet (200 mg total) by mouth daily. 90 tablet 3  . apixaban (ELIQUIS) 5 MG TABS tablet Take 1 tablet (5 mg total) by mouth 2 (two) times daily. 60 tablet 0  . azelastine (ASTELIN) 0.1 % nasal spray Place 2 sprays into the nose daily. Use in each nostril as directed (Patient taking differently: Place 2 sprays into both nostrils daily. ) 90 mL 1  . benzonatate (TESSALON) 100 MG capsule Take 1 capsule (100 mg total) by mouth 3 (three) times daily as needed for cough. 90 capsule 0  . budesonide-formoterol (SYMBICORT) 160-4.5 MCG/ACT inhaler Inhale 2 puffs into the lungs 2 (two) times daily. 3 Inhaler 4  . buPROPion (WELLBUTRIN XL) 150 MG 24 hr tablet Take 1 tablet (150 mg total) by mouth daily. 30 tablet 11  . Calcium Carbonate-Vitamin D (CALCIUM + D PO) Take 1 tablet by mouth 2 (two) times daily.     . cyclobenzaprine (FLEXERIL) 5 MG tablet Take 1 tablet (5 mg total) by mouth 3 (three) times daily as needed for muscle spasms. 270 tablet 0  . ezetimibe (ZETIA) 10 MG tablet Take 1 tablet (10 mg total) by mouth daily. 90 tablet 3  . fluticasone (FLONASE) 50 MCG/ACT nasal spray Place 1 spray into both nostrils 2 (two) times daily. 48 g 1  . furosemide (LASIX) 20 MG tablet Take 1 tablet (20 mg total) by mouth as needed (swelling). 90 tablet 0  . guaiFENesin (MUCINEX) 600 MG 12  hr tablet Take 1,200 mg by mouth 2 (two) times daily as needed for cough or to loosen phlegm.    . Lidocaine HCl 2 % CREA Apply 1 application topically daily as needed (for shingles flares).    . LORazepam (ATIVAN) 0.5 MG tablet Take 0.5 mg by mouth at bedtime.    . Melatonin 5 MG TABS Take 5 mg by mouth at bedtime.    . metoprolol succinate (TOPROL-XL) 100 MG 24 hr tablet Take 1 tablet (100 mg total) by mouth 2 (two) times daily. Take with or immediately following a meal. 180 tablet 3  . mirabegron ER (MYRBETRIQ) 50 MG TB24 tablet Take 50 mg by mouth daily.    . montelukast (SINGULAIR) 10  MG tablet Take 10 mg by mouth daily.    . Multiple Vitamin (MULTIVITAMIN) tablet Take 1 tablet by mouth daily.      Marland Kitchen nystatin (MYCOSTATIN) 100000 UNIT/ML suspension Take 5 cc by mouth, swish and swallow 4 times daily. 280 mL 0  . omeprazole (PRILOSEC) 40 MG capsule Take 1 capsule (40 mg total) by mouth daily. 90 capsule 3  . Respiratory Therapy Supplies (FLUTTER) DEVI Use 2-3 times per day 1 each 0  . Spacer/Aero-Holding Chambers (AEROCHAMBER Z-STAT PLUS) inhaler Use as instructed 1 each 0  . Tiotropium Bromide Monohydrate (SPIRIVA RESPIMAT) 2.5 MCG/ACT AERS Inhale 2 puffs into the lungs daily. 3 Inhaler 3  . [DISCONTINUED] valsartan (DIOVAN) 160 MG tablet Take 1 tablet (160 mg total) by mouth daily. 90 tablet 3   No current facility-administered medications on file prior to visit.     Past Medical History:  Diagnosis Date  . Allergic rhinitis   . Anxiety   . Atrial septal aneurysm   . Basal cell carcinoma   . Bradycardia   . Cataract    bil cataracts removed  . Chronic combined systolic and diastolic CHF (congestive heart failure) ([place])   . CKD (chronic kidney disease), stage III   . COPD (chronic obstructive pulmonary disease) ([place])   . Depression 11/29/2013  . DI (detrusor instability)   . Esophageal stricture   . GERD (gastroesophageal reflux disease)   . Hemorrhoids   . Hiatal hernia   . History of kidney stones   . HLD (hyperlipidemia)   . HTN (hypertension)   . Ischemic colon ([place])    a. 08/2016 - adm to  [facility] with a ruptured colon s/p right hemicolectomy with ileostomy formation for ischemic and necrotic colon.   . Lung nodule    a. followed by pulmonology  . Mild CAD    a. minor nonobstructive CAD 08/22/16.  . Nephrolithiasis    hx  . NICM (nonischemic cardiomyopathy) ([place])   . Orthostatic hypotension   . Osteoarthritis   . PAF (paroxysmal atrial fibrillation) ([place])    a. diagnosed 07/2016 s/p DCCV 08/01/16 with recurrence, managed with rate control for  now.   . Pneumonia 2013  . PVC's (premature ventricular contractions)   . Shingles since Nov 18, 2013   on right arm from shoulder to wrist  . Status post dilation of esophageal narrowing   . Stroke [facility]) 2001   STROKES, TIA'S  . Syncope    a. 08/2016 felt due to orthostatic hypotension.  Marland Kitchen TIA (transient ischemic attack) last 2001   anticoagulation therapy on coumadin    Past Surgical History:  Procedure Laterality Date  . ANTERIOR AND POSTERIOR VAGINAL REPAIR  2008   A&P REPAIR-DR MCDIARMID  . ANTERIOR LATERAL LUMBAR  FUSION 4 LEVELS Right 07/03/2015   Procedure: Right Lumbar one-two, Lumbar two-three, Lumbar three-four, Lumbar four-five Anterior lateral lumbar interbody fusion;  Surgeon: Erline Levine, MD;  Location: [place] NEURO ORS;  Service: Neurosurgery;  Laterality: Right;  Right L1-2 L2-3 L3-4 L4-5 Anterior lateral lumbar interbody fusion  . BALLOON DILATION N/A 03/03/2014   Procedure: BALLOON DILATION;  Surgeon: Inda Castle, MD;  Location: [facility] ENDOSCOPY;  Service: Endoscopy;  Laterality: N/A;  . BLADDER SUSPENSION    . CARDIAC CATHETERIZATION N/A 08/22/2016   Procedure: Left Heart Cath and Coronary Angiography;  Surgeon: Peter M Martinique, MD;  Location: [place] CV LAB;  Service: Cardiovascular;  Laterality: N/A;  . CARDIOVERSION N/A 08/01/2016   Procedure: CARDIOVERSION;  Surgeon: Jerline Pain, MD;  Location: [place];  Service: Cardiovascular;  Laterality: N/A;  . CARDIOVERSION N/A 10/11/2016   Procedure: CARDIOVERSION;  Surgeon: Sueanne Margarita, MD;  Location: [facility] ENDOSCOPY;  Service: Cardiovascular;  Laterality: N/A;  . COLONOSCOPY    . ESOPHAGOGASTRODUODENOSCOPY N/A 03/03/2014   Procedure: ESOPHAGOGASTRODUODENOSCOPY (EGD);  Surgeon: Inda Castle, MD;  Location: [facility];  Service: Endoscopy;  Laterality: N/A;  . EXCISION OF BASAL CELL CA  2011  . GLUTEUS MINIMUS REPAIR Right 03/15/2016   Procedure: RIGHT HIP GLUTEUS MEDIUS TENDON REPAIR;  Surgeon: Paralee Cancel, MD;   Location: [facility] ORS;  Service: Orthopedics;  Laterality: Right;  . LUMBAR PERCUTANEOUS PEDICLE SCREW 4 LEVEL Right 07/03/2015   Procedure: Lumbar one-Sacral one Bilateral percutaneous pedicle screws;  Surgeon: Erline Levine, MD;  Location: [place] NEURO ORS;  Service: Neurosurgery;  Laterality: Right;  L1-S1 Bilateral percutaneous pedicle screws  . RECTOCELE REPAIR  2008   A&P REPAIR -DR. MCDIARMID  . THUMB SURGERY Right 10-15 yrs ago  . UPPER GASTROINTESTINAL ENDOSCOPY    . VAGINAL HYSTERECTOMY  1983   NO BSO-DR. MABRY  . VARICOSE VEIN SURGERY      Family History  Problem Relation Age of Onset  . COPD Mother   . Breast cancer Mother     mid 55's  . Uterine cancer Mother   . Emphysema Mother   . Breast cancer Maternal Aunt     x 3 aunts, post menopausal  . Colon cancer Neg Hx   . Esophageal cancer Neg Hx   . Rectal cancer Neg Hx   . Stomach cancer Neg Hx   . Pancreatic cancer Neg Hx     Social History   Social History  . Marital status: Married    Spouse name: N/A  . Number of children: 2  . Years of education: N/A   Occupational History  . retired Retired   Social History Main Topics  . Smoking status: Former Smoker    Packs/day: 3.00    Years: 42.00    Types: Cigarettes    Start date: 08/04/1954    Quit date: 12/06/1995  . Smokeless tobacco: Never Used  . Alcohol use No     Comment: occas very little  . Drug use: No  . Sexual activity: Not Currently    Birth control/ protection: Surgical   Other Topics Concern  . None   Social History Narrative   Retired- Dentist Pulmonary:   Originally from [place]. Has always lived in [place]. She has traveled to [place], [place], [place], [place], [place], [place]. Has been on multiple cruises. 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 She did  use a face mask consistently to avoid chemical fume exposure. She may have only had an  inhaled exposure a couple of times. She currently has a dog. Remote exposure to a parakeet. No mold or hot tub exposure. No asbestos exposure.       Objective:   Physical Exam BP 124/64 (BP Location: Left Arm, Cuff Size: Normal)   Pulse (!) 57   Ht 5\' 3"  (1.6 m)   Wt 133 lb (60.3 kg)   LMP  (Exact Date)   SpO2 96%   BMI 23.56 kg/m  General:  Awake. Accompanied by husband. No acute distress. Comfortable. Integument:  Warm & dry. No rash on exposed skin.  HEENT:  No scleral icterus. No nasal turbinate swelling. Moist mucous membranes. No abnormal mucosa within left frontal nasal mucosa. No sinus tenderness to palpation. Cardiovascular:  Regular rate and rhythm. No edema. Normal S1 & S2. Pulmonary:  Good aeration bilaterally. Clear with auscultation. Normal work of breathing on room air. Musculoskeletal:  Normal bulk and tone. No joint deformity or effusion appreciated.  PFT 10/10/16: FVC 1.94 L (70%) FEV1 0.93 L (45%) FEV1/FVC 0.48 FEF 25-75 0.35 L (22%) positive bronchodilator response 03/01/16: FVC 2.55 L (91%) FEV1 1.30 L (62%) FEV1/FVC 0.51 FEF 25-75 0.56 L (34%) negative bronchodilator response TLC 5.66 L (111%) RV 116% ERV 133% DLCO uncorrected 62% (hemoglobin 15.1) 12/28/11:FVC 2.89 L (104%) FEV1 1.22 L (62%) FEV1/FVC 0.42 FEF 25-75 0.33 L (15%) negative bronchodilator response TLC 6.19 L (129%) RV 163% ERV 114% DLCO uncorrected 69%  6MWT 10/10/16:  Walked 96 meters / Baseline Sat 93% on RA / Nadir Sat 93% on RA @ rest (couldn't finish due to weakness & dyspnea  W/ 2:57 left) 08/24/16:  Walked 1 lap & was 96% at lowest on room air  IMAGING CTA CHEST 08/17/16 (previously reviewed by me):  No evidence of PE. No pleural effusion or thickening. No pathologic mediastinal adenopathy. Groundglass noted within right lung predominantly in the upper lung zone. Moderate upper lobe predominant emphysema which has progressed since 2015. 7 mm right lower lobe nodule has not changed since 2015.  Patient has a 4 mm nodule within right middle lobe which appears new.  CXR PA/LAT 05/25/16 (previously reviewed by me): Hyperinflation with flattening of the diaphragms. No parenchymal opacity or mass appreciated. No pleural effusion. Heart normal in size & mediastinum normal in contour.  CXR PA/LAT 03/22/16 (previously reviewed by me):  Hyperinflation with elevation of left hemidiaphragm. No new focal opacity. Heart normal in size & mediastinum normal in contour.  CXR PA/LAT 11/09/15 (previously reviewed by me): No focal opacity or effusion appreciated. Heart normal in size. Mediastinum normal in contour. Mild hyperinflation with flattening of the diaphragms.   CARDIAC TTE (06/24/16): LVEF 45-50% with grade 2 diastolic dysfunction. LA moderately to severely dilated. RA normal in size. RV normal in size and function. No aortic stenosis or regurgitation. Moderate centrally directed mitral regurgitation. Paradoxical ventricular septal motion consistent with intraventricular conduction delay. No pulmonic regurgitation. No tricuspid regurgitation. No pericardial effusion.  LABS 12/22/15 Alpha-1 antitrypsin: MM (143)    Assessment & Plan:  75 y.o. female with underlying moderate COPD, Right Middle Lobe Nodule, Chronic Allergic Rhinitis, & GERD w/ Hiatal Hernia.Patient's level of dyspnea has significantly improved after cardioversion. Symptomatically her COPD seems to be very well controlled. Her previous spirometry and walk test were performed pre-cardioversion. Despite a significant bronchodilator response I do not feel that adjusting her inhaler medication regimen is necessary at this  time. Her reflux seems to be well-controlled at this time. The etiology for her left nasal pain is unclear as I see no abnormality to account for this. I recommended avoiding her intranasal spray on the left for now. She is planned for ostomy reversal in December and I feel she would be at moderate risk of perioperative  pulmonary complications only from the standpoint of underlying COPD but nothing that should prevent her ability to undergo the procedure from a pulmonary standpoint. I instructed the patient contact my office if she had any questions or new breathing problems before next appointment.  1. Moderate COPD: Continuing Symbicort & Spiriva as prescribed. No changes. 2. Ostomy Reversal:  Patient is a moderate risk of perioperative pulmonary complications from ostomy reversal. This should not preclude her ability to undergo the operation. Her inhaler regimen should be converted to DuoNebs scheduled every 4-6 hours postoperatively until she can resume her home inhaler regimen. As always recommend early ambulation and incentive spirometry for minimization of atelectasis. Please consult the pulmonologist perioperatively if there are any issues that we may be of assistance. 3. Right Middle Lobe Nodule:  Scheduled for repeat CT chest w/o contrast in February 2018. 4. Chronic Allergic Rhinitis: Continuing Singulair, Astelin, and Flonase. Patient to hold off on nasal spray to left nare for now. 5. GERD w/ Hiatal Hernia: Continuing Prilosec as prescribed. Followed by GI. 6. Health Maintenance: S/P Influenza Vaccine September 2017, Prevnar December 2014 & Pneumovax January 2008. 7. Follow-up: Patient to return to clinic in 3 months or sooner if needed.   Sonia Baller Ashok Cordia, M.D. [facility] Pulmonary & Critical Care Pager:  [phone] After 3pm or if no response, call [phone] 9:28 AM 10/18/16

## 2016-10-18 NOTE — Telephone Encounter (Signed)
Done hardcopy to Corinne  

## 2016-10-19 ENCOUNTER — Encounter: Payer: Medicare Other | Admitting: Internal Medicine

## 2016-10-19 ENCOUNTER — Ambulatory Visit: Payer: Self-pay

## 2016-10-19 DIAGNOSIS — Z9181 History of falling: Secondary | ICD-10-CM | POA: Diagnosis not present

## 2016-10-19 DIAGNOSIS — Z48815 Encounter for surgical aftercare following surgery on the digestive system: Secondary | ICD-10-CM | POA: Diagnosis not present

## 2016-10-19 DIAGNOSIS — J449 Chronic obstructive pulmonary disease, unspecified: Secondary | ICD-10-CM | POA: Diagnosis not present

## 2016-10-19 DIAGNOSIS — I509 Heart failure, unspecified: Secondary | ICD-10-CM | POA: Diagnosis not present

## 2016-10-19 DIAGNOSIS — I4891 Unspecified atrial fibrillation: Secondary | ICD-10-CM | POA: Diagnosis not present

## 2016-10-19 DIAGNOSIS — I110 Hypertensive heart disease with heart failure: Secondary | ICD-10-CM | POA: Diagnosis not present

## 2016-10-20 ENCOUNTER — Encounter: Payer: Self-pay | Admitting: Internal Medicine

## 2016-10-20 DIAGNOSIS — Z432 Encounter for attention to ileostomy: Secondary | ICD-10-CM | POA: Insufficient documentation

## 2016-10-21 ENCOUNTER — Telehealth: Payer: Self-pay | Admitting: Emergency Medicine

## 2016-10-21 ENCOUNTER — Telehealth: Payer: Self-pay | Admitting: Cardiology

## 2016-10-21 ENCOUNTER — Ambulatory Visit: Payer: Medicare Other | Admitting: Cardiology

## 2016-10-21 DIAGNOSIS — I4891 Unspecified atrial fibrillation: Secondary | ICD-10-CM | POA: Diagnosis not present

## 2016-10-21 DIAGNOSIS — Z9181 History of falling: Secondary | ICD-10-CM | POA: Diagnosis not present

## 2016-10-21 DIAGNOSIS — Z48815 Encounter for surgical aftercare following surgery on the digestive system: Secondary | ICD-10-CM | POA: Diagnosis not present

## 2016-10-21 DIAGNOSIS — I509 Heart failure, unspecified: Secondary | ICD-10-CM | POA: Diagnosis not present

## 2016-10-21 DIAGNOSIS — I110 Hypertensive heart disease with heart failure: Secondary | ICD-10-CM | POA: Diagnosis not present

## 2016-10-21 DIAGNOSIS — J449 Chronic obstructive pulmonary disease, unspecified: Secondary | ICD-10-CM | POA: Diagnosis not present

## 2016-10-21 NOTE — Telephone Encounter (Signed)
OV notes and labs printed

## 2016-10-21 NOTE — Telephone Encounter (Signed)
Notes faxed to Dr. Lovie Macadamia.

## 2016-10-21 NOTE — Telephone Encounter (Signed)
New Message:    Pt needs a letter stating that she is cleared for Ileostemy Reversible Surgery, Pt surgery is scheduled for 11-14-16,we need clearance asap please.

## 2016-10-21 NOTE — Telephone Encounter (Signed)
RE: Visit Follow-Up Question  Lauren Shipper, MD  Sent: Fri October 21, 2016 11:57 AM  To: Marlon Pel, RN            Message   Nursing,  This is a confusing message. We saw the patient for recurrent dysphagia for which she needs upper endoscopy with esophageal dilation in the [place]. This was to be scheduled off Eliquis when okayed by her cardiologist. If this is the case, move forward. In terms of her out of town Psychologist, sport and exercise and reversal of her ileostomy, her upper endoscopy has no relevance. Furthermore, if that surgeon is looking for "clearance" I imagine it is cardiac in nature. Please redirect the patient and move forward. Thank you  Dr. Henrene Pastor  ----- Message -----  From: Marlon Pel, RN  Sent: 10/21/2016 11:43 AM  To: Lauren Shipper, MD, Algernon Huxley, RN  Subject: Melton Alar: Visit Follow-Up Question                 ----- Message -----  From: [name]  Sent: 10/20/2016  7:35 PM  To: [facility] Clinical Pool  Subject: Visit Follow-Up Question               You messaged me on my chart and ask for my Surgeons phone number in order to find out what he waned done. I sent the number, but he said today that he had not heard from you. Again his name is Dr. Melynda Ripple. his phone number is [phone]. My surgery date is Dec. 11. He will reverse my ileostomy. My heart Dr is [facility] representative. they did a cardioversion on 10-11-16 and he wants me to be on eliquest for 30 days. That would be 11-10-16. please contact him for a release for me to have the Endoscopy. Please advise. Thank you.     I called the patient.  She is scheduled for a ileostomy reversal on 11/14/16.  She reports that she needs to have the EGD completed prior to the surgery on 11/14/16.  According to the chart review she can't come off of her Eliquis until 10/11/16 (Thursday) at the earliest, due to recent cardioversion.  I explained that we would need to hold the Eliquis for two days prior to performing EGD.  I  explained that she is scheduled for surgery on 11/14/16 ( Monday ) and there would not be time to complete this,if her cardiologist clears her to come off of Eliquis, prior to her scheduled surgery.  She reports her surgeon needs this done.  I explained that if the surgeon needs to have Dr. Henrene Pastor perform a procedure that needs to be communicated from her surgeon to Dr. Henrene Pastor.  She reports that her surgeon will reach out to Dr. Henrene Pastor next week. I advised her that we will wait to hear.  At this point until Cardiology clears her to come off of Eliquis the procedure can't be rescheduled, as Dr. Henrene Pastor plans on a possible dilation and that she needs to hold Eliqus for that.

## 2016-10-21 NOTE — Telephone Encounter (Signed)
[place] for all, to CMA for assist with this please

## 2016-10-21 NOTE — Telephone Encounter (Signed)
FYI: Received call from surgical office, states pt is having a colon surgery on Dec 11th. Needing OV and lab notes sent over. Pt is also a pt of Cardiology and Pulmonary, they are receiving surgical clearance from both offices.

## 2016-10-24 ENCOUNTER — Telehealth: Payer: Self-pay | Admitting: Internal Medicine

## 2016-10-24 ENCOUNTER — Telehealth: Payer: Self-pay | Admitting: Cardiology

## 2016-10-24 NOTE — Telephone Encounter (Signed)
°  Follow Up   Calling to verify if paperwork was received to clear pt for surgery from Dr. Coralie Keens and Dr. Henrene Pastor. Please call.

## 2016-10-24 NOTE — Telephone Encounter (Signed)
Pt called and states that we need to contact her cardiologist office for clearance regarding scheduling her for an EGD, pt is on Eliquis. Pt states she has a hernia about the size of a grapefruit under her stoma and she can barely keep a bag on. Pt wants to have all this done before Christmas. Called Dr. Macky Lower office to request clearance for EGD.

## 2016-10-24 NOTE — Telephone Encounter (Signed)
New message   Request for surgical clearance:  What type of surgery is being performed? endoscopy   1. When is this surgery scheduled? unknown  Are there any medications that need to be held prior to surgery and how long? eliquis  2. Name of physician performing surgery? Dr. Henrene Pastor.  3. What is your office phone and fax number? Fax  [phone]

## 2016-10-25 NOTE — Telephone Encounter (Signed)
Spoke with pt and informed her that we have received information from Dr. Blanch Media office but not Dr. Coralie Keens.  She will call Dr. Carlos Levering office to contact us with clearance.

## 2016-10-26 ENCOUNTER — Telehealth: Payer: Self-pay | Admitting: Internal Medicine

## 2016-10-26 ENCOUNTER — Other Ambulatory Visit: Payer: Self-pay

## 2016-10-26 DIAGNOSIS — R1319 Other dysphagia: Secondary | ICD-10-CM

## 2016-10-26 DIAGNOSIS — I509 Heart failure, unspecified: Secondary | ICD-10-CM | POA: Diagnosis not present

## 2016-10-26 DIAGNOSIS — Z9181 History of falling: Secondary | ICD-10-CM | POA: Diagnosis not present

## 2016-10-26 DIAGNOSIS — Z48815 Encounter for surgical aftercare following surgery on the digestive system: Secondary | ICD-10-CM | POA: Diagnosis not present

## 2016-10-26 DIAGNOSIS — J449 Chronic obstructive pulmonary disease, unspecified: Secondary | ICD-10-CM | POA: Diagnosis not present

## 2016-10-26 DIAGNOSIS — I110 Hypertensive heart disease with heart failure: Secondary | ICD-10-CM | POA: Diagnosis not present

## 2016-10-26 DIAGNOSIS — I4891 Unspecified atrial fibrillation: Secondary | ICD-10-CM | POA: Diagnosis not present

## 2016-10-26 DIAGNOSIS — R1310 Dysphagia, unspecified: Secondary | ICD-10-CM

## 2016-10-26 NOTE — Telephone Encounter (Signed)
Let pt know that we have not received cardiac clearance yet.

## 2016-10-26 NOTE — Telephone Encounter (Signed)
Pt scheduled for EGD with dil in the [place] 11/17/16@8 :30am. Pt to arrive at 7:30am, pt knows to be NPO after midnight and to hold eliquis 2 days prior to procedure. Updated prep instructions mailed to pt.

## 2016-10-26 NOTE — Telephone Encounter (Signed)
Patient had cardioversion on 10/11/16.  Would need at least one month of anticoagulation post cardioversion for stroke prevention.  After that, she would be at intermediate risk for an intermediate risks surgery.  If anticoagulation needs to be held, would hold for 2-3 days pre and restart ASAP post procedure.

## 2016-10-26 NOTE — Telephone Encounter (Signed)
Noted. Thank you Linda!

## 2016-10-26 NOTE — Telephone Encounter (Signed)
Pt scheduled for EGD with dil 11/17/16@8 :30am. Pt to be NPO after 4:30am. Pt to hold eliquis starting 11/15/16. Pt aware.

## 2016-10-26 NOTE — Telephone Encounter (Signed)
Will route clearance recommendations per Dr Curt Bears to Dr Henrene Pastor and Rosanne Sack RN at [place] office.  Jacques Navy, at Dr [facility] office is aware of Dr Curt Bears recommendations for this pt.

## 2016-10-26 NOTE — Telephone Encounter (Signed)
See preop note in epic to be sent to surgeon.  I have not received a form for preop evaluation from the surgeons office.

## 2016-10-28 DIAGNOSIS — I110 Hypertensive heart disease with heart failure: Secondary | ICD-10-CM | POA: Diagnosis not present

## 2016-10-28 DIAGNOSIS — Z9181 History of falling: Secondary | ICD-10-CM | POA: Diagnosis not present

## 2016-10-28 DIAGNOSIS — Z48815 Encounter for surgical aftercare following surgery on the digestive system: Secondary | ICD-10-CM | POA: Diagnosis not present

## 2016-10-28 DIAGNOSIS — I509 Heart failure, unspecified: Secondary | ICD-10-CM | POA: Diagnosis not present

## 2016-10-28 DIAGNOSIS — I4891 Unspecified atrial fibrillation: Secondary | ICD-10-CM | POA: Diagnosis not present

## 2016-10-28 DIAGNOSIS — J449 Chronic obstructive pulmonary disease, unspecified: Secondary | ICD-10-CM | POA: Diagnosis not present

## 2016-10-31 ENCOUNTER — Ambulatory Visit: Payer: Self-pay

## 2016-10-31 ENCOUNTER — Encounter: Payer: Self-pay | Admitting: Cardiology

## 2016-10-31 ENCOUNTER — Other Ambulatory Visit: Payer: Self-pay

## 2016-10-31 DIAGNOSIS — Z9181 History of falling: Secondary | ICD-10-CM | POA: Diagnosis not present

## 2016-10-31 DIAGNOSIS — J449 Chronic obstructive pulmonary disease, unspecified: Secondary | ICD-10-CM | POA: Diagnosis not present

## 2016-10-31 DIAGNOSIS — I110 Hypertensive heart disease with heart failure: Secondary | ICD-10-CM | POA: Diagnosis not present

## 2016-10-31 DIAGNOSIS — Z48815 Encounter for surgical aftercare following surgery on the digestive system: Secondary | ICD-10-CM | POA: Diagnosis not present

## 2016-10-31 DIAGNOSIS — I509 Heart failure, unspecified: Secondary | ICD-10-CM | POA: Diagnosis not present

## 2016-10-31 DIAGNOSIS — I4891 Unspecified atrial fibrillation: Secondary | ICD-10-CM | POA: Diagnosis not present

## 2016-10-31 NOTE — Telephone Encounter (Signed)
Patient is calling to f/u on her surgical clearance for Dr. Lovie Macadamia to perform her hernia surgery.  She said that she would like to get the surgery done before Christmas but cannot schedule the surgery until she is cleared by Dr. Curt Bears.  Please call patient regarding this.

## 2016-10-31 NOTE — Telephone Encounter (Signed)
This encounter was created in error - please disregard.

## 2016-10-31 NOTE — Telephone Encounter (Signed)
Dr. Carlos Levering, in [place], office will fax another clearance request.

## 2016-10-31 NOTE — Telephone Encounter (Signed)
Informed patient that our office had not received any clearance request.  Pt states their office faxed something over a week ago. Explained that I would look into this, call surgeon's office and make sure we get request faxed and completed. She understands I will call her once received/completed.

## 2016-10-31 NOTE — Telephone Encounter (Signed)
[name] at 10/31/2016 12:17 PM   Status: Signed    Patient is calling to f/u on her surgical clearance for Dr. Lovie Macadamia to perform her hernia surgery.  She said that she would like to get the surgery done before Christmas but cannot schedule the surgery until she is cleared by Dr. Curt Bears.  Please call patient regarding this.

## 2016-10-31 NOTE — Patient Outreach (Signed)
Care coordination: Placed call to patient to follow up on needs. Patient reports that she has struggled to get her surgical clearance sent to her surgeon.  Reports that she is hopeful this will be done later today or tomorrow.   PLAN: will contact patient again in 2 days for an update. Tomasa Rand, RN, BSN, CEN [facility] [employer] [phone]

## 2016-11-01 ENCOUNTER — Encounter: Payer: Self-pay | Admitting: *Deleted

## 2016-11-01 ENCOUNTER — Encounter: Payer: Self-pay | Admitting: Cardiology

## 2016-11-01 NOTE — Telephone Encounter (Signed)
Tamera Reason at 11/01/2016 11:42 AM   Status: Signed    New Message  Tonja call to check on pt surgical clearance faxed on 11/27. Please call back to discuss

## 2016-11-01 NOTE — Telephone Encounter (Signed)
This encounter was created in error - please disregard.

## 2016-11-01 NOTE — Telephone Encounter (Signed)
New Message  Tonja call to check on pt surgical clearance faxed on 11/27. Please call back to discuss

## 2016-11-01 NOTE — Telephone Encounter (Signed)
Informed requesting office & patient that fax was sent today with clearance information. Pt thanks me for helping take care of this.

## 2016-11-02 ENCOUNTER — Other Ambulatory Visit: Payer: Self-pay

## 2016-11-02 NOTE — Patient Outreach (Signed)
Care coordination: Placed call to patient for planned follow up. No answer.  Left a message requesting a call back.  Tomasa Rand, RN, BSN, CEN [facility] [employer] [phone]

## 2016-11-02 NOTE — Patient Outreach (Signed)
Care coordination/ case closure:  Patient returned call and states that she has gotten her med clearance and her colostomy reversal is scheduled for 11/21/2016.  Patient reports that she is very happy about this. Denies any other problems or needs at this time.   Reviewed case closure with a patient and she is in agreement. Goals met. Reviewed with patient to call [facility] if she has needs in the future. She agreed.  PLAN: Will close case as goals are met. Will send case closure letter to patient and MD.  [facility] CM Care Plan Problem One   Flowsheet Row Most Recent Value  Care Plan Problem One  Recent admission for ileostomy and abdominal surgery.  Role Documenting the Problem One  Care Management [place] for Problem One  Active  THN Long Term Goal (31-90 days)  Patient will report no readmissions for the next 31 days.  THN Long Term Goal Start Date  09/15/16  THN Long Term Goal Met Date  10/17/16  Interventions for Problem One Long Term Goal  Placed call to cardiology to secure MD follow up for cardioversion.   THN CM Short Term Goal #1 (0-30 days)  Patient will report feeling stronger and having improved weight gain in the next 30 days.  THN CM Short Term Goal #1 Start Date  09/15/16  [facility] CM Short Term Goal #1 Met Date  10/17/16  Interventions for Short Term Goal #1  Encouraged patient to continue to eat and drink well.  Encouraged patient to call MD for any concerns.  THN CM Short Term Goal #2 (0-30 days)  Patient will report weighing daily for the next 30 days.  THN CM Short Term Goal #2 Start Date  09/15/16  [facility] CM Short Term Goal #2 Met Date  10/17/16  Interventions for Short Term Goal #2  Confirmed with patient that she continues to [place] daily. Sipport provided.   THN CM Short Term Goal #3 (0-30 days)  Patient will report navigating the health system in order to avoid hospital admissions.   THN CM Short Term Goal #3 Start Date  10/17/16  [facility] CM Short Term Goal #3 Met Date   11/02/16  Interventions for Short Tern Goal #3  Reviewed if assistance needed and patient thinks the paperwork is being completed. Will folllow up in 2 days.      Tomasa Rand, RN, BSN, CEN [facility] [employer] [phone]

## 2016-11-04 ENCOUNTER — Telehealth: Payer: Self-pay | Admitting: Adult Health

## 2016-11-04 ENCOUNTER — Encounter: Payer: Self-pay | Admitting: Adult Health

## 2016-11-04 ENCOUNTER — Ambulatory Visit (INDEPENDENT_AMBULATORY_CARE_PROVIDER_SITE_OTHER): Payer: Medicare Other | Admitting: Adult Health

## 2016-11-04 DIAGNOSIS — J441 Chronic obstructive pulmonary disease with (acute) exacerbation: Secondary | ICD-10-CM

## 2016-11-04 MED ORDER — METHYLPREDNISOLONE ACETATE 80 MG/ML IJ SUSP
80.0000 mg | Freq: Once | INTRAMUSCULAR | Status: AC
  Administered 2016-11-04: 80 mg via INTRAMUSCULAR

## 2016-11-04 MED ORDER — LEVALBUTEROL HCL 0.63 MG/3ML IN NEBU
0.6300 mg | INHALATION_SOLUTION | Freq: Once | RESPIRATORY_TRACT | Status: AC
  Administered 2016-11-04: 0.63 mg via RESPIRATORY_TRACT

## 2016-11-04 MED ORDER — PROMETHAZINE-CODEINE 6.25-10 MG/5ML PO SYRP: 5.0000 mL | ORAL_SOLUTION | Freq: Four times a day (QID) | ORAL | 0 refills | Status: DC | PRN

## 2016-11-04 MED ORDER — AMOXICILLIN-POT CLAVULANATE 875-125 MG PO TABS: 1.0000 | ORAL_TABLET | Freq: Two times a day (BID) | ORAL | 0 refills | Status: AC

## 2016-11-04 NOTE — Telephone Encounter (Signed)
Pt was seen today by TP, told TP that she had cough syrup at home but she does not.  Pt is requesting something be called in to [facility] in [place].    TP please advise.  Thanks.

## 2016-11-04 NOTE — Patient Instructions (Addendum)
Augmentin 875mg  Twice daily  For 7 days  Mucinex DM Twice daily  As needed  Cough /congestion .  Fluids and rest .  Saline nasal rinses As needed   Follow up .Dr. Ashok Cordia in 3 months and As needed   Please contact office for sooner follow up if symptoms do not improve or worsen or seek emergency care

## 2016-11-04 NOTE — Telephone Encounter (Signed)
She can have phenergran vc w/ codeine that is all we can call in Will that be ok with her.

## 2016-11-04 NOTE — Telephone Encounter (Signed)
Spoke with pt, she is ok with this being called in for her. Per TP, ok to call in [place] with no refills, 1 tsp q6h prn cough. This has been called in.  Nothing further needed.

## 2016-11-04 NOTE — Assessment & Plan Note (Signed)
Flare -recurrent  Depo 80mg  IM x 1   Plan  Patient Instructions  Augmentin 875mg  Twice daily  For 7 days  Mucinex DM Twice daily  As needed  Cough /congestion .  Fluids and rest .  Saline nasal rinses As needed   Follow up .Dr. Ashok Cordia in 3 months and As needed   Please contact office for sooner follow up if symptoms do not improve or worsen or seek emergency care

## 2016-11-04 NOTE — Progress Notes (Signed)
Subjective:    Patient ID: [name], female    DOB: 21-Feb-1941, 75 y.o.   MRN: [id number]  HPI  75 yo female with COPD  Previous pt of Dr. Joya Gaskins    TEST :  12/2011 with FEV1 62%, ratio 42  , FVC 104% , LDCO 69%,  10/2016 Spirometry >FEV1 53%, ratio 65, ++ BD response , FVC 78%.  S/p pulm rehab 2015    11/04/2016 Acute OV : COPD Pt presents for an acute office visit. Complains of 3 days of productive cough with thick green mucus, barking cough and nasal drainage , with wheezing .   Remains on Symbicort and Spiriva .  No chest pain, orthopnea, fever. Or hemoptysis . No body aches. Flu shot utd.  Using mucinex and tessalon without much help . Has surgery for ileostomy reversal on 11/21/16 , wants to be better.  CXR 1 month ago with chronic changes . Has similar flare 4-6 weeks ago , tx w/ abx and steroids , got better until this week.   Past Medical History:  Diagnosis Date  . Allergic rhinitis   . Anxiety   . Atrial septal aneurysm   . Basal cell carcinoma   . Bradycardia   . Cataract    bil cataracts removed  . Chronic combined systolic and diastolic CHF (congestive heart failure) ([place])   . CKD (chronic kidney disease), stage III   . COPD (chronic obstructive pulmonary disease) ([place])   . Depression 11/29/2013  . DI (detrusor instability)   . Esophageal stricture   . GERD (gastroesophageal reflux disease)   . Hemorrhoids   . Hiatal hernia   . History of kidney stones   . HLD (hyperlipidemia)   . HTN (hypertension)   . Ischemic colon ([place])    a. 08/2016 - adm to  [facility] with a ruptured colon s/p right hemicolectomy with ileostomy formation for ischemic and necrotic colon.   . Lung nodule    a. followed by pulmonology  . Mild CAD    a. minor nonobstructive CAD 08/22/16.  . Nephrolithiasis    hx  . NICM (nonischemic cardiomyopathy) ([place])   . Orthostatic hypotension   . Osteoarthritis   . PAF (paroxysmal atrial fibrillation) ([place])    a. diagnosed 07/2016  s/p DCCV 08/01/16 with recurrence, managed with rate control for now.   . Pneumonia 2013  . PVC's (premature ventricular contractions)   . Shingles since Nov 18, 2013   on right arm from shoulder to wrist  . Status post dilation of esophageal narrowing   . Stroke [facility]) 2001   STROKES, TIA'S  . Syncope    a. 08/2016 felt due to orthostatic hypotension.  Marland Kitchen TIA (transient ischemic attack) last 2001   anticoagulation therapy on coumadin   Current Outpatient Prescriptions on File Prior to Visit  Medication Sig Dispense Refill  . albuterol (PROVENTIL HFA;VENTOLIN HFA) 108 (90 BASE) MCG/ACT inhaler Inhale 1-2 puffs into the lungs every 6 (six) hours as needed for wheezing or shortness of breath. 3 Inhaler 4  . amiodarone (PACERONE) 200 MG tablet Take 1 tablet (200 mg total) by mouth daily. 90 tablet 3  . apixaban (ELIQUIS) 5 MG TABS tablet Take 1 tablet (5 mg total) by mouth 2 (two) times daily. 60 tablet 0  . azelastine (ASTELIN) 0.1 % nasal spray Place 2 sprays into the nose daily. Use in each nostril as directed (Patient taking differently: Place 2 sprays into both nostrils daily. ) 90 mL 1  .  benzonatate (TESSALON) 100 MG capsule Take 1 capsule (100 mg total) by mouth 3 (three) times daily as needed for cough. 90 capsule 0  . budesonide-formoterol (SYMBICORT) 160-4.5 MCG/ACT inhaler Inhale 2 puffs into the lungs 2 (two) times daily. 3 Inhaler 4  . buPROPion (WELLBUTRIN XL) 150 MG 24 hr tablet Take 1 tablet (150 mg total) by mouth daily. 30 tablet 11  . Calcium Carbonate-Vitamin D (CALCIUM + D PO) Take 1 tablet by mouth 2 (two) times daily.     . cyclobenzaprine (FLEXERIL) 5 MG tablet Take 1 tablet (5 mg total) by mouth 3 (three) times daily as needed for muscle spasms. 270 tablet 0  . ezetimibe (ZETIA) 10 MG tablet Take 1 tablet (10 mg total) by mouth daily. 90 tablet 3  . fluticasone (FLONASE) 50 MCG/ACT nasal spray Place 1 spray into both nostrils 2 (two) times daily. 48 g 1  . furosemide  (LASIX) 20 MG tablet Take 1 tablet (20 mg total) by mouth as needed (swelling). 90 tablet 0  . guaiFENesin (MUCINEX) 600 MG 12 hr tablet Take 1,200 mg by mouth 2 (two) times daily as needed for cough or to loosen phlegm.    . Lidocaine HCl 2 % CREA Apply 1 application topically daily as needed (for shingles flares).    . LORazepam (ATIVAN) 0.5 MG tablet Take 1 tablet (0.5 mg total) by mouth at bedtime. 30 tablet 5  . Melatonin 5 MG TABS Take 5 mg by mouth at bedtime.    . metoprolol succinate (TOPROL-XL) 100 MG 24 hr tablet Take 1 tablet (100 mg total) by mouth 2 (two) times daily. Take with or immediately following a meal. 180 tablet 3  . mirabegron ER (MYRBETRIQ) 50 MG TB24 tablet Take 50 mg by mouth daily.    . montelukast (SINGULAIR) 10 MG tablet Take 10 mg by mouth daily.    . Multiple Vitamin (MULTIVITAMIN) tablet Take 1 tablet by mouth daily.      Marland Kitchen nystatin (MYCOSTATIN) 100000 UNIT/ML suspension Take 5 cc by mouth, swish and swallow 4 times daily. 280 mL 0  . omeprazole (PRILOSEC) 40 MG capsule Take 1 capsule (40 mg total) by mouth daily. 90 capsule 3  . Respiratory Therapy Supplies (FLUTTER) DEVI Use 2-3 times per day 1 each 0  . Spacer/Aero-Holding Chambers (AEROCHAMBER Z-STAT PLUS) inhaler Use as instructed 1 each 0  . Tiotropium Bromide Monohydrate (SPIRIVA RESPIMAT) 2.5 MCG/ACT AERS Inhale 2 puffs into the lungs daily. 3 Inhaler 3  . [DISCONTINUED] valsartan (DIOVAN) 160 MG tablet Take 1 tablet (160 mg total) by mouth daily. 90 tablet 3   No current facility-administered medications on file prior to visit.      Review of Systems Constitutional:   No  weight loss, night sweats,  Fevers, chills + fatigue, or  lassitude.  HEENT:   No headaches,  Difficulty swallowing,  Tooth/dental problems, or  Sore throat,                No sneezing, itching, ear ache,  +nasal congestion, post nasal drip,   CV:  No chest pain,  Orthopnea, PND, swelling in lower extremities, anasarca,  dizziness, palpitations, syncope.   GI  No heartburn, indigestion, abdominal pain, nausea, vomiting, diarrhea, change in bowel habits, loss of appetite, bloody stools.   Resp:  .  No chest wall deformity  Skin: no rash or lesions.  GU: no dysuria, change in color of urine, no urgency or frequency.  No flank pain, no hematuria  MS:  No joint pain or swelling.  No decreased range of motion.  No back pain.  Psych:  No change in mood or affect. No depression or anxiety.  No memory loss.         Objective:   Physical Exam Vitals:   11/04/16 1014  BP: 120/70  Pulse: 66  Temp: 97.9 F (36.6 C)  TempSrc: Oral  SpO2: 98%  Weight: 133 lb 12.8 oz (60.7 kg)  Height: 5\' 4"  (1.626 m)     GEN: A/Ox3; pleasant , NAD, elderly   HEENT:  [place]/AT,  EACs-clear, TMs-wnl, NOSE-clear, THROAT-clear, no lesions, no postnasal drip or exudate noted.   NECK:  Supple w/ fair ROM; no JVD; normal carotid impulses w/o bruits; no thyromegaly or nodules palpated; no lymphadenopathy.    RESP  Few scattered rhonchi and wheezes noted.   no accessory muscle use, no dullness to percussion  CARD:  RRR, no m/r/g  , no peripheral edema, pulses intact, no cyanosis or clubbing.  GI:   Soft & nt; nml bowel sounds; no organomegaly ., RLL ileostomy   Musco: Warm bil, no deformities or joint swelling noted.  Arthritic changes of the hands   Neuro: alert, no focal deficits noted.  , walks with limp /uses cane  Skin: Warm, no lesions or rashes   Lauren Sherman NP-C  [facility]  11/04/2016

## 2016-11-04 NOTE — Addendum Note (Signed)
Addended by: Doroteo Glassman D on: 11/04/2016 12:50 PM   Modules accepted: Orders

## 2016-11-09 ENCOUNTER — Ambulatory Visit: Payer: Medicare Other | Admitting: Internal Medicine

## 2016-11-10 ENCOUNTER — Ambulatory Visit (INDEPENDENT_AMBULATORY_CARE_PROVIDER_SITE_OTHER)
Admission: RE | Admit: 2016-11-10 | Discharge: 2016-11-10 | Disposition: A | Discharge status: Home / Self Care | Payer: Medicare Other | Source: Ambulatory Visit | Attending: Acute Care | Admitting: Acute Care

## 2016-11-10 ENCOUNTER — Ambulatory Visit (INDEPENDENT_AMBULATORY_CARE_PROVIDER_SITE_OTHER): Payer: Medicare Other | Admitting: Acute Care

## 2016-11-10 ENCOUNTER — Encounter: Payer: Self-pay | Admitting: Acute Care

## 2016-11-10 VITALS — BP 128/64 | HR 63 | Ht 64.0 in | Wt 133.2 lb

## 2016-11-10 DIAGNOSIS — R911 Solitary pulmonary nodule: Secondary | ICD-10-CM

## 2016-11-10 DIAGNOSIS — J441 Chronic obstructive pulmonary disease with (acute) exacerbation: Secondary | ICD-10-CM | POA: Diagnosis not present

## 2016-11-10 DIAGNOSIS — R0600 Dyspnea, unspecified: Secondary | ICD-10-CM

## 2016-11-10 MED ORDER — PREDNISONE 10 MG PO TABS: ORAL_TABLET | ORAL | 0 refills | Status: DC

## 2016-11-10 NOTE — Progress Notes (Signed)
History of Present Illness [name] is a 75 y.o. female with history of bronchitis, COPD ( Moderate), GERD and allergic rhinitis previously seen by Dr. Joya Gaskins, but now is  followed by Dr. Ashok Cordia.   11/10/2016 Acute OV " Still Sick" Pt. Presents to the office today for complain of continued cough .She was seen by Rexene Edison, NP in this office  on 11/04/2016 with complaints of productive cough with thick green mucus, barking cough and nasal drainage with wheezing. She was treated at that time with Augmentin 875 BID x 7 days,Depo Medrol 80 mg IM x 1, and encouraged to use Mucinex and saline rinses for chest congestion and symptom relief..Additionally she was prescribed Phenergan VC with codeine for cough. Last dose of Augmentin is  today 11/10/2016. She returns to the office today stating she is still sick. She states she was compliant with her antibiotic She states she has major surgery in 1 week, and wants to make sure she is well for it. She states she has a dry hacking cough, that is non-productive. She states she no longer has green secretions. She did not take the phenergan cough syrup as it made her illiostomy drainage too thick. She is compliant with her Symbicort, Soiriva, Singulair, Astelin and Flonase. She is using her Mucinex and the [place] [facility] nasal rinse.She denies fever, chest pain, orthopnea or hemoptysis. We did discuss that recent respiratory infection within a month of surgery does increase risk of respiratory complications post op.She verbalized understanding.  Upcoming Scheduled Procedures :  11/17/2016: Endoscopy (Conscious sedation) 11/21/2017: Scheduled for Ileostomy take down.( General Anesthesia)  Tests 11/10/2016: CXR:  IMPRESSION: 1. COPD.  No acute cardiopulmonary disease  2. Pulmonary nodule again noted in the right lung base.  3. Sliding hiatal hernia.  Past medical hx Past Medical History:  Diagnosis Date  . Allergic rhinitis   . Anxiety   . Atrial  septal aneurysm   . Basal cell carcinoma   . Bradycardia   . Cataract    bil cataracts removed  . Chronic combined systolic and diastolic CHF (congestive heart failure) ([place])   . CKD (chronic kidney disease), stage III   . COPD (chronic obstructive pulmonary disease) ([place])   . Depression 11/29/2013  . DI (detrusor instability)   . Esophageal stricture   . GERD (gastroesophageal reflux disease)   . Hemorrhoids   . Hiatal hernia   . History of kidney stones   . HLD (hyperlipidemia)   . HTN (hypertension)   . Ischemic colon ([place])    a. 08/2016 - adm to  [facility] with a ruptured colon s/p right hemicolectomy with ileostomy formation for ischemic and necrotic colon.   . Lung nodule    a. followed by pulmonology  . Mild CAD    a. minor nonobstructive CAD 08/22/16.  . Nephrolithiasis    hx  . NICM (nonischemic cardiomyopathy) ([place])   . Orthostatic hypotension   . Osteoarthritis   . PAF (paroxysmal atrial fibrillation) ([place])    a. diagnosed 07/2016 s/p DCCV 08/01/16 with recurrence, managed with rate control for now.   . Pneumonia 2013  . PVC's (premature ventricular contractions)   . Shingles since Nov 18, 2013   on right arm from shoulder to wrist  . Status post dilation of esophageal narrowing   . Stroke [facility] 2001   STROKES, TIA'S  . Syncope    a. 08/2016 felt due to orthostatic hypotension.  Marland Kitchen TIA (transient ischemic attack) last 2001  anticoagulation therapy on coumadin     Past surgical hx, Family hx, Social hx all reviewed.  Current Outpatient Prescriptions on File Prior to Visit  Medication Sig  . albuterol (PROVENTIL HFA;VENTOLIN HFA) 108 (90 BASE) MCG/ACT inhaler Inhale 1-2 puffs into the lungs every 6 (six) hours as needed for wheezing or shortness of breath.  Marland Kitchen amiodarone (PACERONE) 200 MG tablet Take 1 tablet (200 mg total) by mouth daily.  Marland Kitchen amoxicillin-clavulanate (AUGMENTIN) 875-125 MG tablet Take 1 tablet by mouth 2 (two) times daily.  Marland Kitchen apixaban  (ELIQUIS) 5 MG TABS tablet Take 1 tablet (5 mg total) by mouth 2 (two) times daily.  Marland Kitchen azelastine (ASTELIN) 0.1 % nasal spray Place 2 sprays into the nose daily. Use in each nostril as directed (Patient taking differently: Place 2 sprays into both nostrils daily. )  . benzonatate (TESSALON) 100 MG capsule Take 1 capsule (100 mg total) by mouth 3 (three) times daily as needed for cough.  . budesonide-formoterol (SYMBICORT) 160-4.5 MCG/ACT inhaler Inhale 2 puffs into the lungs 2 (two) times daily.  Marland Kitchen buPROPion (WELLBUTRIN XL) 150 MG 24 hr tablet Take 1 tablet (150 mg total) by mouth daily.  . Calcium Carbonate-Vitamin D (CALCIUM + D PO) Take 1 tablet by mouth 2 (two) times daily.   . cyclobenzaprine (FLEXERIL) 5 MG tablet Take 1 tablet (5 mg total) by mouth 3 (three) times daily as needed for muscle spasms.  . fluticasone (FLONASE) 50 MCG/ACT nasal spray Place 1 spray into both nostrils 2 (two) times daily.  . furosemide (LASIX) 20 MG tablet Take 1 tablet (20 mg total) by mouth as needed (swelling).  Marland Kitchen guaiFENesin (MUCINEX) 600 MG 12 hr tablet Take 1,200 mg by mouth 2 (two) times daily as needed for cough or to loosen phlegm.  . Lidocaine HCl 2 % CREA Apply 1 application topically daily as needed (for shingles flares).  . LORazepam (ATIVAN) 0.5 MG tablet Take 1 tablet (0.5 mg total) by mouth at bedtime.  . Melatonin 5 MG TABS Take 5 mg by mouth at bedtime.  . metoprolol succinate (TOPROL-XL) 100 MG 24 hr tablet Take 1 tablet (100 mg total) by mouth 2 (two) times daily. Take with or immediately following a meal.  . mirabegron ER (MYRBETRIQ) 50 MG TB24 tablet Take 50 mg by mouth daily.  . montelukast (SINGULAIR) 10 MG tablet Take 10 mg by mouth daily.  . Multiple Vitamin (MULTIVITAMIN) tablet Take 1 tablet by mouth daily.    Marland Kitchen nystatin (MYCOSTATIN) 100000 UNIT/ML suspension Take 5 cc by mouth, swish and swallow 4 times daily.  Marland Kitchen omeprazole (PRILOSEC) 40 MG capsule Take 1 capsule (40 mg total) by mouth  daily.  . promethazine-codeine (PHENERGAN WITH CODEINE) 6.25-10 MG/5ML syrup Take 5 mLs by mouth every 6 (six) hours as needed for cough.  Marland Kitchen Respiratory Therapy Supplies (FLUTTER) DEVI Use 2-3 times per day  . Spacer/Aero-Holding Chambers (AEROCHAMBER Z-STAT PLUS) inhaler Use as instructed  . Tiotropium Bromide Monohydrate (SPIRIVA RESPIMAT) 2.5 MCG/ACT AERS Inhale 2 puffs into the lungs daily.  Marland Kitchen ezetimibe (ZETIA) 10 MG tablet Take 1 tablet (10 mg total) by mouth daily.  . [DISCONTINUED] valsartan (DIOVAN) 160 MG tablet Take 1 tablet (160 mg total) by mouth daily.   No current facility-administered medications on file prior to visit.      Allergies  Allergen Reactions  . Clarithromycin Other (See Comments)    REACTION: possible rash but could have been legionarres dz with rash  . Lipitor [Atorvastatin] Other (  See Comments)    MUSCLE SPASMS  . Simvastatin Other (See Comments)    Muscle and joint pain  . Cardizem [Diltiazem Hcl] Hives  . Contrast Media [Iodinated Diagnostic Agents]     FYI - during admission at [facility] 08/2016 patient developed hives, question related to cardizem or contrast dye - not clear  . Crestor [Rosuvastatin Calcium]     fagitue and joint pain, "NO STATINS"    Review Of Systems:  Constitutional:   No  weight loss, night sweats,  Fevers, chills, fatigue, or  lassitude.  HEENT:   No headaches,  Difficulty swallowing,  Tooth/dental problems, or  Sore throat,                No sneezing, itching, ear ache, +nasal congestion, post nasal drip,   CV:  No chest pain,  Orthopnea, PND, swelling in lower extremities, anasarca, dizziness, palpitations, syncope.   GI  No heartburn, indigestion, abdominal pain, nausea, vomiting, diarrhea, change in bowel habits, loss of appetite, bloody stools.   Resp: + shortness of breath with exertion less at rest.  No excess mucus, no productive cough,  + non-productive cough,  No coughing up of blood.  No change in color of  mucus.  + wheezing.  No chest wall deformity  Skin: no rash or lesions.  GU: no dysuria, change in color of urine, no urgency or frequency.  No flank pain, no hematuria   MS:  No joint pain or swelling.  No decreased range of motion.  No back pain.  Psych:  No change in mood or affect. No depression or anxiety.  No memory loss.   Vital Signs BP 128/64 (BP Location: Left Arm, Cuff Size: Normal)   Pulse 63   Ht 5\' 4"  (1.626 m)   Wt 133 lb 3.2 oz (60.4 kg)   LMP  (Exact Date)   SpO2 95%   BMI 22.86 kg/m    Physical Exam:  General- No distress,  A&Ox3, anxious ENT: No sinus tenderness, TM clear, pale nasal mucosa, no oral exudate,no post nasal drip, no LAN Cardiac: S1, S2, regular rate and rhythm, no murmur Chest: + wheeze/ no rales/ + dullness per bases ; no accessory muscle use, no nasal flaring, no sternal retractions Abd.: Soft Non-tender, illiostomy Ext: No clubbing cyanosis, edema Neuro:  normal strength Skin: No rashes, warm and dry Psych: normal mood and behavior   Assessment/Plan  Gold B Copd with acute bronchitis Slow to resolve COPD/ bronchitis flare with pending surgical procedures in 1 week.  Attempting to optimize pulmonary status prior to surgery Plan: CXR in the office today. Neb treatment in the office today. Complete antibiotic prescribed 11/05/2016 . Short Prednisone Taper 40 mg x 1 day, 30 mg x 1 day, 20 mg x 1 day, 10 mg x 2 days then stop. Take in the morning with food. Continue your omeprazole 40 mg once daily. Continue using your Mucinex for chest congestion. Continue taking your Symbicort, Spiriva, Singulair, Astelin and Flonase as you have been doing.  Use Tessalon Perles as needed for cough Follow up CT Chest for nodule surveillance  01/2017 as is scheduled Follow up with Dr. Ashok Cordia 01/26/2016 as is scheduled. Please contact office for sooner follow up if symptoms do not improve or worsen or seek emergency care    Lung nodule Follow up CT  Scan 01/2017 for follow up of ground glass nodules noted per CT  08/2016 with appointment with Dr. Ashok Cordia after scan.  Magdalen Spatz, NP 11/10/2016  1:25 PM

## 2016-11-10 NOTE — Assessment & Plan Note (Addendum)
Slow to resolve COPD/ bronchitis flare with pending surgical procedures in 1 week.  Attempting to optimize pulmonary status prior to surgery Plan: CXR in the office today. Neb treatment in the office today. Complete antibiotic prescribed 11/05/2016 . Short Prednisone Taper 40 mg x 1 day, 30 mg x 1 day, 20 mg x 1 day, 10 mg x 2 days then stop. Take in the morning with food. Continue your omeprazole 40 mg once daily. Continue using your Mucinex for chest congestion. Continue taking your Symbicort, Spiriva, Singulair, Astelin and Flonase as you have been doing.  Use Tessalon Perles as needed for cough Follow up CT Chest for nodule surveillance  01/2017 as is scheduled Follow up with Dr. Ashok Cordia 01/26/2016 as is scheduled. Please contact office for sooner follow up if symptoms do not improve or worsen or seek emergency care

## 2016-11-10 NOTE — Patient Instructions (Addendum)
It is nice to see you today. CXR in the office today. Neb treatment in the office today. Short Prednisone Taper 40 mg x 1 day, 30 mg x 1 day, 20 mg x 1 day, 10 mg x 2 days then stop. Take in the morning with food. Continue your omeprazole 40 mg once daily. Continue using your Mucinex for chest congestion. Continue taking your Symbicort, Spiriva, Singulair, Astelin and Flonase as you have been doing.  Use Tessalon Perles as needed for cough Follow up CT Chest 01/2017 Follow up with Dr. Ashok Cordia 01/26/2016 as is scheduled. Please contact office for sooner follow up if symptoms do not improve or worsen or seek emergency care

## 2016-11-10 NOTE — Assessment & Plan Note (Signed)
Follow up CT Scan 01/2017 for follow up of ground glass nodules noted per CT  08/2016 with appointment with Dr. Ashok Cordia after scan.

## 2016-11-14 DIAGNOSIS — Z48815 Encounter for surgical aftercare following surgery on the digestive system: Secondary | ICD-10-CM | POA: Diagnosis not present

## 2016-11-14 DIAGNOSIS — I110 Hypertensive heart disease with heart failure: Secondary | ICD-10-CM | POA: Diagnosis not present

## 2016-11-14 DIAGNOSIS — J449 Chronic obstructive pulmonary disease, unspecified: Secondary | ICD-10-CM | POA: Diagnosis not present

## 2016-11-14 DIAGNOSIS — Z9181 History of falling: Secondary | ICD-10-CM | POA: Diagnosis not present

## 2016-11-14 DIAGNOSIS — I509 Heart failure, unspecified: Secondary | ICD-10-CM | POA: Diagnosis not present

## 2016-11-14 DIAGNOSIS — I4891 Unspecified atrial fibrillation: Secondary | ICD-10-CM | POA: Diagnosis not present

## 2016-11-17 ENCOUNTER — Ambulatory Visit (AMBULATORY_SURGERY_CENTER): Payer: Medicare Other | Admitting: Internal Medicine

## 2016-11-17 ENCOUNTER — Encounter: Payer: Self-pay | Admitting: Internal Medicine

## 2016-11-17 VITALS — BP 115/57 | HR 66 | Temp 95.9°F | Resp 15 | Ht 64.0 in | Wt 133.0 lb

## 2016-11-17 DIAGNOSIS — J449 Chronic obstructive pulmonary disease, unspecified: Secondary | ICD-10-CM | POA: Diagnosis not present

## 2016-11-17 DIAGNOSIS — K222 Esophageal obstruction: Secondary | ICD-10-CM

## 2016-11-17 DIAGNOSIS — Z8673 Personal history of transient ischemic attack (TIA), and cerebral infarction without residual deficits: Secondary | ICD-10-CM | POA: Diagnosis not present

## 2016-11-17 DIAGNOSIS — Z7901 Long term (current) use of anticoagulants: Secondary | ICD-10-CM | POA: Diagnosis not present

## 2016-11-17 DIAGNOSIS — R1310 Dysphagia, unspecified: Secondary | ICD-10-CM

## 2016-11-17 DIAGNOSIS — I509 Heart failure, unspecified: Secondary | ICD-10-CM | POA: Diagnosis not present

## 2016-11-17 DIAGNOSIS — Z48815 Encounter for surgical aftercare following surgery on the digestive system: Secondary | ICD-10-CM | POA: Diagnosis not present

## 2016-11-17 DIAGNOSIS — I110 Hypertensive heart disease with heart failure: Secondary | ICD-10-CM | POA: Diagnosis not present

## 2016-11-17 DIAGNOSIS — Z432 Encounter for attention to ileostomy: Secondary | ICD-10-CM | POA: Diagnosis not present

## 2016-11-17 DIAGNOSIS — Z87891 Personal history of nicotine dependence: Secondary | ICD-10-CM | POA: Diagnosis not present

## 2016-11-17 DIAGNOSIS — I10 Essential (primary) hypertension: Secondary | ICD-10-CM | POA: Diagnosis not present

## 2016-11-17 DIAGNOSIS — R269 Unspecified abnormalities of gait and mobility: Secondary | ICD-10-CM | POA: Diagnosis not present

## 2016-11-17 DIAGNOSIS — Z9181 History of falling: Secondary | ICD-10-CM | POA: Diagnosis not present

## 2016-11-17 DIAGNOSIS — I4891 Unspecified atrial fibrillation: Secondary | ICD-10-CM | POA: Diagnosis not present

## 2016-11-17 MED ORDER — SODIUM CHLORIDE 0.9 % IV SOLN: 500.0000 mL | INTRAVENOUS | Status: DC

## 2016-11-17 NOTE — Progress Notes (Signed)
Report to PACU, RN, vss, BBS= Clear.  

## 2016-11-17 NOTE — Op Note (Signed)
[place] Patient Name: [name] Procedure Date: 11/17/2016 8:31 AM MRN: [id number] Endoscopist: Docia Chuck. Henrene Pastor , MD Age: 75 Referring MD:  Date of Birth: [birth year] Gender: Female Account #: [id number] Procedure:                Upper GI endoscopy, with [facility] dilation of the                            esophagus Indications:              Dysphagia. Had EGD earlier this year with dilation                            for similar symptoms. Complains of solids greater                            than liquids Medicines:                Monitored Anesthesia Care Procedure:                Pre-Anesthesia Assessment:                           - Prior to the procedure, a History and Physical                            was performed, and patient medications and                            allergies were reviewed. The patient's tolerance of                            previous anesthesia was also reviewed. The risks                            and benefits of the procedure and the sedation                            options and risks were discussed with the patient.                            All questions were answered, and informed consent                            was obtained. Prior Anticoagulants: The patient has                            taken Eliquis (apixaban), last dose was 3 days                            prior to procedure. ASA Grade Assessment: III - A                            patient with severe systemic disease. After  reviewing the risks and benefits, the patient was                            deemed in satisfactory condition to undergo the                            procedure.                           After obtaining informed consent, the endoscope was                            passed under direct vision. Throughout the                            procedure, the patient's blood pressure, pulse, and                            oxygen  saturations were monitored continuously. The                            Model GIF-[id number]) scope was introduced                            through the mouth, and advanced to the second part                            of duodenum. The upper GI endoscopy was                            accomplished without difficulty. The patient                            tolerated the procedure well. Scope In: Scope Out: Findings:                 One very mild benign-appearing, intrinsic stenosis                            was found 40 cm from the incisors. The scope was                            withdrawn. Dilation was performed with a Maloney                            dilator with no resistance at 41 Fr.                           The exam of the esophagus was otherwise normal.                           The stomach was normal.                           The examined duodenum was normal.  The cardia and gastric fundus were normal on                            retroflexion. Small hiatal hernia present. Complications:            No immediate complications. Estimated Blood Loss:     Estimated blood loss: none. Impression:               - Benign-appearing esophageal stenosis. Dilated.                           - Normal stomach.                           - Normal examined duodenum.                           - No specimens collected. Recommendation:           - Patient has a contact number available for                            emergencies. The signs and symptoms of potential                            delayed complications were discussed with the                            patient. Return to normal activities tomorrow.                            Written discharge instructions were provided to the                            patient.                           - Resume previous diet.                           - Continue present medications.                           -  Resume Eliquis (apixaban) at prior dose per your                            prescribing physician(patient told me she was                            instructed to continue to hold). Docia Chuck. Henrene Pastor, MD 11/17/2016 8:59:53 AM This report has been signed electronically.

## 2016-11-17 NOTE — Progress Notes (Signed)
Called to room to assist during endoscopic procedure.  Patient ID and intended procedure confirmed with present staff. Received instructions for my participation in the procedure from the performing physician.  

## 2016-11-17 NOTE — Patient Instructions (Addendum)
Impression/Recommendations:  Reviewed post dilation diet with pt. And husband.   Handout given to pt. And husband.  Resume Eliquis at prior dose.   Resume present medications and previous diet.  YOU HAD AN ENDOSCOPIC PROCEDURE TODAY AT [facility]:   Refer to the procedure report that was given to you for any specific questions about what was found during the examination.  If the procedure report does not answer your questions, please call your gastroenterologist to clarify.  If you requested that your care partner not be given the details of your procedure findings, then the procedure report has been included in a sealed envelope for you to review at your convenience later.  YOU SHOULD EXPECT: Some feelings of bloating in the abdomen. Passage of more gas than usual.  Walking can help get rid of the air that was put into your GI tract during the procedure and reduce the bloating. If you had a lower endoscopy (such as a colonoscopy or flexible sigmoidoscopy) you may notice spotting of blood in your stool or on the toilet paper. If you underwent a bowel prep for your procedure, you may not have a normal bowel movement for a few days.  Please Note:  You might notice some irritation and congestion in your nose or some drainage.  This is from the oxygen used during your procedure.  There is no need for concern and it should clear up in a day or so.  SYMPTOMS TO REPORT IMMEDIATELY:  Following upper endoscopy (EGD)  Vomiting of blood or coffee ground material  New chest pain or pain under the shoulder blades  Painful or persistently difficult swallowing  New shortness of breath  Fever of 100F or higher  Black, tarry-looking stools  For urgent or emergent issues, a gastroenterologist can be reached at any hour by calling [phone].   DIET:  We do recommend a small meal at first, but then you may proceed to your regular diet.  Drink plenty of fluids but you should avoid  alcoholic beverages for 24 hours.  ACTIVITY:  You should plan to take it easy for the rest of today and you should NOT DRIVE or use heavy machinery until tomorrow (because of the sedation medicines used during the test).    FOLLOW UP: Our staff will call the number listed on your records the next business day following your procedure to check on you and address any questions or concerns that you may have regarding the information given to you following your procedure. If we do not reach you, we will leave a message.  However, if you are feeling well and you are not experiencing any problems, there is no need to return our call.  We will assume that you have returned to your regular daily activities without incident.  If any biopsies were taken you will be contacted by phone or by letter within the next 1-3 weeks.  Please call us at [phone] if you have not heard about the biopsies in 3 weeks.    SIGNATURES/CONFIDENTIALITY: You and/or your care partner have signed paperwork which will be entered into your electronic medical record.  These signatures attest to the fact that that the information above on your After Visit Summary has been reviewed and is understood.  Full responsibility of the confidentiality of this discharge information lies with you and/or your care-partner.

## 2016-11-18 ENCOUNTER — Telehealth: Payer: Self-pay

## 2016-11-18 NOTE — Telephone Encounter (Signed)
  I will call the patient and relay Dr. Blanch Media message that patient should follow up with her Primary Care physician if she isn't feeling better. Thanks Dr. Henrene Pastor.   [name]

## 2016-11-18 NOTE — Telephone Encounter (Signed)
  Follow up Call-  Call back number 11/17/2016 04/14/2016  Post procedure Call Back phone  # [phone] hm  Permission to leave phone message Yes Yes  Some recent data might be hidden    Patient was called for follow up after her procedure on 11/17/2016. I spoke to Shanon Brow and he reports that Altheia continues to have gas pains. He said she took Gas X last night and it seemed to help but the gas pains are back this morning. I told her to take it again. Shanon Brow also states that Sylvia was vomiting while we were speaking and that her blood pressure was low. I told Shanon Brow that I would inform Dr. Henrene Pastor of Nayely's condition but since the vomiting started today it may be something totally unrelated to the procedure and she may need to follow up with Primary Care.  Please advise. Thanks.   Riki Sheer, LPN

## 2016-11-18 NOTE — Telephone Encounter (Signed)
Yes, she was fine postprocedure. I cannot relate her symptoms to the procedure. She should see her PCP as recommended. Please let her know

## 2016-11-21 DIAGNOSIS — E785 Hyperlipidemia, unspecified: Secondary | ICD-10-CM | POA: Diagnosis present

## 2016-11-21 DIAGNOSIS — K219 Gastro-esophageal reflux disease without esophagitis: Secondary | ICD-10-CM | POA: Diagnosis present

## 2016-11-21 DIAGNOSIS — A0472 Enterocolitis due to Clostridium difficile, not specified as recurrent: Secondary | ICD-10-CM | POA: Diagnosis not present

## 2016-11-21 DIAGNOSIS — G894 Chronic pain syndrome: Secondary | ICD-10-CM | POA: Diagnosis present

## 2016-11-21 DIAGNOSIS — E860 Dehydration: Secondary | ICD-10-CM | POA: Diagnosis present

## 2016-11-21 DIAGNOSIS — J90 Pleural effusion, not elsewhere classified: Secondary | ICD-10-CM | POA: Diagnosis not present

## 2016-11-21 DIAGNOSIS — Z8719 Personal history of other diseases of the digestive system: Secondary | ICD-10-CM | POA: Diagnosis not present

## 2016-11-21 DIAGNOSIS — N179 Acute kidney failure, unspecified: Secondary | ICD-10-CM | POA: Diagnosis present

## 2016-11-21 DIAGNOSIS — R0989 Other specified symptoms and signs involving the circulatory and respiratory systems: Secondary | ICD-10-CM | POA: Diagnosis not present

## 2016-11-21 DIAGNOSIS — I482 Chronic atrial fibrillation: Secondary | ICD-10-CM | POA: Diagnosis present

## 2016-11-21 DIAGNOSIS — I110 Hypertensive heart disease with heart failure: Secondary | ICD-10-CM | POA: Diagnosis present

## 2016-11-21 DIAGNOSIS — Z9981 Dependence on supplemental oxygen: Secondary | ICD-10-CM | POA: Diagnosis not present

## 2016-11-21 DIAGNOSIS — Z87891 Personal history of nicotine dependence: Secondary | ICD-10-CM | POA: Diagnosis not present

## 2016-11-21 DIAGNOSIS — I5042 Chronic combined systolic (congestive) and diastolic (congestive) heart failure: Secondary | ICD-10-CM | POA: Diagnosis present

## 2016-11-21 DIAGNOSIS — F418 Other specified anxiety disorders: Secondary | ICD-10-CM

## 2016-11-21 DIAGNOSIS — I10 Essential (primary) hypertension: Secondary | ICD-10-CM | POA: Diagnosis not present

## 2016-11-21 DIAGNOSIS — F419 Anxiety disorder, unspecified: Secondary | ICD-10-CM | POA: Diagnosis present

## 2016-11-21 DIAGNOSIS — Z8679 Personal history of other diseases of the circulatory system: Secondary | ICD-10-CM | POA: Diagnosis not present

## 2016-11-21 DIAGNOSIS — Z8673 Personal history of transient ischemic attack (TIA), and cerebral infarction without residual deficits: Secondary | ICD-10-CM | POA: Diagnosis not present

## 2016-11-21 DIAGNOSIS — Z9889 Other specified postprocedural states: Secondary | ICD-10-CM | POA: Diagnosis not present

## 2016-11-21 DIAGNOSIS — Z932 Ileostomy status: Secondary | ICD-10-CM | POA: Diagnosis not present

## 2016-11-21 DIAGNOSIS — J449 Chronic obstructive pulmonary disease, unspecified: Secondary | ICD-10-CM | POA: Diagnosis not present

## 2016-11-21 DIAGNOSIS — I509 Heart failure, unspecified: Secondary | ICD-10-CM | POA: Diagnosis not present

## 2016-11-21 DIAGNOSIS — Z432 Encounter for attention to ileostomy: Secondary | ICD-10-CM | POA: Diagnosis not present

## 2016-11-21 DIAGNOSIS — K589 Irritable bowel syndrome without diarrhea: Secondary | ICD-10-CM | POA: Diagnosis present

## 2016-11-21 DIAGNOSIS — R509 Fever, unspecified: Secondary | ICD-10-CM | POA: Diagnosis not present

## 2016-11-23 DIAGNOSIS — Z9889 Other specified postprocedural states: Secondary | ICD-10-CM

## 2016-11-23 DIAGNOSIS — I509 Heart failure, unspecified: Secondary | ICD-10-CM

## 2016-11-23 DIAGNOSIS — N179 Acute kidney failure, unspecified: Secondary | ICD-10-CM

## 2016-11-23 DIAGNOSIS — K219 Gastro-esophageal reflux disease without esophagitis: Secondary | ICD-10-CM

## 2016-11-23 DIAGNOSIS — Z8679 Personal history of other diseases of the circulatory system: Secondary | ICD-10-CM

## 2016-11-23 DIAGNOSIS — J449 Chronic obstructive pulmonary disease, unspecified: Secondary | ICD-10-CM

## 2016-11-23 DIAGNOSIS — F418 Other specified anxiety disorders: Secondary | ICD-10-CM

## 2016-11-23 DIAGNOSIS — I10 Essential (primary) hypertension: Secondary | ICD-10-CM

## 2016-11-25 ENCOUNTER — Encounter: Payer: Self-pay | Admitting: Internal Medicine

## 2016-11-29 ENCOUNTER — Encounter: Payer: Self-pay | Admitting: Internal Medicine

## 2016-11-29 DIAGNOSIS — J449 Chronic obstructive pulmonary disease, unspecified: Secondary | ICD-10-CM | POA: Diagnosis not present

## 2016-11-29 DIAGNOSIS — I4891 Unspecified atrial fibrillation: Secondary | ICD-10-CM | POA: Diagnosis not present

## 2016-11-29 DIAGNOSIS — I509 Heart failure, unspecified: Secondary | ICD-10-CM | POA: Diagnosis not present

## 2016-11-29 DIAGNOSIS — R269 Unspecified abnormalities of gait and mobility: Secondary | ICD-10-CM | POA: Diagnosis not present

## 2016-11-29 DIAGNOSIS — I110 Hypertensive heart disease with heart failure: Secondary | ICD-10-CM | POA: Diagnosis not present

## 2016-11-29 DIAGNOSIS — Z9181 History of falling: Secondary | ICD-10-CM | POA: Diagnosis not present

## 2016-11-29 NOTE — Telephone Encounter (Signed)
Staff to assist with pt scheduling OV sooner than jan 20 if possible, please

## 2016-11-29 NOTE — Telephone Encounter (Signed)
Got patient schedule

## 2016-11-30 ENCOUNTER — Telehealth: Payer: Self-pay | Admitting: *Deleted

## 2016-11-30 NOTE — Telephone Encounter (Signed)
Called pt she was on the patient ping list discharge from [place] on 11/27/16. Competed TCM call below.../lmb  Transition Care Management Follow-up Telephone Call   Date discharged? 11/27/16   How have you been since you were released from the hospital? Pt states she feel terrible. She has C-diff and have to take antibiotics just feel sick.   Do you understand why you were in the hospital? YES- she states he BP started running low, and MD there wanted to changer her heart medication but she decline. Instead of taking her metoprolol twice a day she states she check her BP before taking the medication, and if its low she does not take one. She then was dx w/C-diff currently under quarantine until 12/08/16.    Do you understand the discharge instructions? YES   Where were you discharged to? Home   Items Reviewed:  Medications reviewed: YES  Allergies reviewed: YES  Dietary changes reviewed: YES  Referrals reviewed: No referral needed   Functional Questionnaire:   Activities of Daily Living (ADLs):   She states she are independent in the following: ambulation, bathing and hygiene, feeding, continence, grooming, toileting and dressing States she doesn't require assistance    Any transportation issues/concerns?: NO   Any patient concerns? NO   Confirmed importance and date/time of follow-up visits scheduled YES, she had made appt already 12/09/16  Provider Appointment booked with Dr. Jenny Reichmann  Confirmed with patient if condition begins to worsen call PCP or go to the ER.  Patient was given the office number and encouraged to call back with question or concerns.  : YES

## 2016-12-01 DIAGNOSIS — R269 Unspecified abnormalities of gait and mobility: Secondary | ICD-10-CM | POA: Diagnosis not present

## 2016-12-01 DIAGNOSIS — Z9181 History of falling: Secondary | ICD-10-CM | POA: Diagnosis not present

## 2016-12-01 DIAGNOSIS — I4891 Unspecified atrial fibrillation: Secondary | ICD-10-CM | POA: Diagnosis not present

## 2016-12-01 DIAGNOSIS — J449 Chronic obstructive pulmonary disease, unspecified: Secondary | ICD-10-CM | POA: Diagnosis not present

## 2016-12-01 DIAGNOSIS — I110 Hypertensive heart disease with heart failure: Secondary | ICD-10-CM | POA: Diagnosis not present

## 2016-12-01 DIAGNOSIS — I509 Heart failure, unspecified: Secondary | ICD-10-CM | POA: Diagnosis not present

## 2016-12-05 ENCOUNTER — Encounter: Payer: Self-pay | Admitting: Internal Medicine

## 2016-12-06 DIAGNOSIS — J449 Chronic obstructive pulmonary disease, unspecified: Secondary | ICD-10-CM | POA: Diagnosis not present

## 2016-12-06 DIAGNOSIS — I509 Heart failure, unspecified: Secondary | ICD-10-CM | POA: Diagnosis not present

## 2016-12-06 DIAGNOSIS — Z9181 History of falling: Secondary | ICD-10-CM | POA: Diagnosis not present

## 2016-12-06 DIAGNOSIS — R269 Unspecified abnormalities of gait and mobility: Secondary | ICD-10-CM | POA: Diagnosis not present

## 2016-12-06 DIAGNOSIS — I110 Hypertensive heart disease with heart failure: Secondary | ICD-10-CM | POA: Diagnosis not present

## 2016-12-06 DIAGNOSIS — I4891 Unspecified atrial fibrillation: Secondary | ICD-10-CM | POA: Diagnosis not present

## 2016-12-06 NOTE — Telephone Encounter (Signed)
Patient calling in regarding this. Patient does not have an upcoming appointment. Does patient need to schedule an appointment? If so, when does patient need to come in? Best # [phone]

## 2016-12-07 ENCOUNTER — Telehealth: Payer: Self-pay | Admitting: Emergency Medicine

## 2016-12-07 NOTE — Telephone Encounter (Signed)
Spoke to patient I have no records of those notes she is going to have them faxed over.

## 2016-12-07 NOTE — Telephone Encounter (Signed)
Pt called and wanted to know if you have a copy of her Hospital records from [facility]. If not she is going to get you a copy of those. Please advise thanks.

## 2016-12-09 ENCOUNTER — Encounter: Payer: Self-pay | Admitting: Internal Medicine

## 2016-12-09 ENCOUNTER — Ambulatory Visit (INDEPENDENT_AMBULATORY_CARE_PROVIDER_SITE_OTHER): Payer: Medicare Other | Admitting: Internal Medicine

## 2016-12-09 VITALS — BP 120/70 | HR 78 | Temp 98.4°F | Resp 20 | Wt 128.0 lb

## 2016-12-09 DIAGNOSIS — E785 Hyperlipidemia, unspecified: Secondary | ICD-10-CM

## 2016-12-09 DIAGNOSIS — R739 Hyperglycemia, unspecified: Secondary | ICD-10-CM

## 2016-12-09 DIAGNOSIS — J438 Other emphysema: Secondary | ICD-10-CM

## 2016-12-09 DIAGNOSIS — I10 Essential (primary) hypertension: Secondary | ICD-10-CM | POA: Diagnosis not present

## 2016-12-09 NOTE — Progress Notes (Signed)
Subjective:    Patient ID: [name], female    DOB: [birth year], 76 y.o.   MRN: [id number]  HPI  Here to f/u; overall doing ok,  Pt denies chest pain, increasing sob or doe, wheezing, orthopnea, PND, increased LE swelling, palpitations, dizziness or syncope.  Pt denies new neurological symptoms such as new headache, or facial or extremity weakness or numbness.  Pt denies polydipsia, polyuria, or low sugar episode.   Pt denies new neurological symptoms such as new headache, or facial or extremity weakness or numbness.   Pt states overall good compliance with meds, mostly trying to follow appropriate diet, with wt decreased after 65mo recent hx of ileostomy, then takedown, then recurrent c diff, now with 3 days left oral tx.  Diarrhea almost resolved.  Abd wound requiring wound care but improving  No other new history Wt Readings from Last 3 Encounters:  12/09/16 128 lb (58.1 kg)  11/17/16 133 lb (60.3 kg)  11/10/16 133 lb 3.2 oz (60.4 kg)   Past Medical History:  Diagnosis Date  . Allergic rhinitis   . Anxiety   . Atrial septal aneurysm   . Basal cell carcinoma   . Bradycardia   . Cataract    bil cataracts removed  . Chronic combined systolic and diastolic CHF (congestive heart failure) ([place])   . CKD (chronic kidney disease), stage III   . COPD (chronic obstructive pulmonary disease) ([place])   . Depression 11/29/2013  . DI (detrusor instability)   . Esophageal stricture   . GERD (gastroesophageal reflux disease)   . Hemorrhoids   . Hiatal hernia   . History of kidney stones   . HLD (hyperlipidemia)   . HTN (hypertension)   . Ischemic colon ([place])    a. 08/2016 - adm to  [facility] with a ruptured colon s/p right hemicolectomy with ileostomy formation for ischemic and necrotic colon.   . Lung nodule    a. followed by pulmonology  . Mild CAD    a. minor nonobstructive CAD 08/22/16.  . Nephrolithiasis    hx  . NICM (nonischemic cardiomyopathy) ([place])   . Orthostatic  hypotension   . Osteoarthritis   . PAF (paroxysmal atrial fibrillation) ([place])    a. diagnosed 07/2016 s/p DCCV 08/01/16 with recurrence, managed with rate control for now.   . Pneumonia 2013  . PVC's (premature ventricular contractions)   . Shingles since Nov 18, 2013   on right arm from shoulder to wrist  . Status post dilation of esophageal narrowing   . Stroke [facility]) 2001   STROKES, TIA'S  . Syncope    a. 08/2016 felt due to orthostatic hypotension.  Marland Kitchen TIA (transient ischemic attack) last 2001   anticoagulation therapy on coumadin   Past Surgical History:  Procedure Laterality Date  . ANTERIOR AND POSTERIOR VAGINAL REPAIR  2008   A&P REPAIR-DR MCDIARMID  . ANTERIOR LATERAL LUMBAR FUSION 4 LEVELS Right 07/03/2015   Procedure: Right Lumbar one-two, Lumbar two-three, Lumbar three-four, Lumbar four-five Anterior lateral lumbar interbody fusion;  Surgeon: Erline Levine, MD;  Location: [place] [facility];  Service: Neurosurgery;  Laterality: Right;  Right L1-2 L2-3 L3-4 L4-5 Anterior lateral lumbar interbody fusion  . BALLOON DILATION N/A 03/03/2014   Procedure: BALLOON DILATION;  Surgeon: Inda Castle, MD;  Location: [facility];  Service: Endoscopy;  Laterality: N/A;  . BLADDER SUSPENSION    . CARDIAC CATHETERIZATION N/A 08/22/2016   Procedure: Left Heart Cath and Coronary Angiography;  Surgeon: Ander Slade  Martinique, MD;  Location: [place] CV LAB;  Service: Cardiovascular;  Laterality: N/A;  . CARDIOVERSION N/A 08/01/2016   Procedure: CARDIOVERSION;  Surgeon: Jerline Pain, MD;  Location: [place];  Service: Cardiovascular;  Laterality: N/A;  . CARDIOVERSION N/A 10/11/2016   Procedure: CARDIOVERSION;  Surgeon: Sueanne Margarita, MD;  Location: [facility];  Service: Cardiovascular;  Laterality: N/A;  . COLONOSCOPY    . ESOPHAGOGASTRODUODENOSCOPY N/A 03/03/2014   Procedure: ESOPHAGOGASTRODUODENOSCOPY (EGD);  Surgeon: Inda Castle, MD;  Location: [facility];  Service: Endoscopy;  Laterality:  N/A;  . EXCISION OF BASAL CELL CA  2011  . GLUTEUS MINIMUS REPAIR Right 03/15/2016   Procedure: RIGHT HIP GLUTEUS MEDIUS TENDON REPAIR;  Surgeon: Paralee Cancel, MD;  Location: [facility];  Service: Orthopedics;  Laterality: Right;  . LUMBAR PERCUTANEOUS PEDICLE SCREW 4 LEVEL Right 07/03/2015   Procedure: Lumbar one-Sacral one Bilateral percutaneous pedicle screws;  Surgeon: Erline Levine, MD;  Location: [place] [facility];  Service: Neurosurgery;  Laterality: Right;  L1-S1 Bilateral percutaneous pedicle screws  . RECTOCELE REPAIR  2008   A&P REPAIR -DR. MCDIARMID  . THUMB SURGERY Right 10-15 yrs ago  . UPPER GASTROINTESTINAL ENDOSCOPY    . VAGINAL HYSTERECTOMY  1983   NO BSO-DR. MABRY  . VARICOSE VEIN SURGERY      reports that she quit smoking about 21 years ago. Her smoking use included Cigarettes. She started smoking about 62 years ago. She has a 126.00 pack-year smoking history. She has never used smokeless tobacco. She reports that she does not drink alcohol or use drugs. family history includes Breast cancer in her maternal aunt and mother; COPD in her mother; Emphysema in her mother; Uterine cancer in her mother. Allergies  Allergen Reactions  . Clarithromycin Other (See Comments)    REACTION: possible rash but could have been legionarres dz with rash  . Lipitor [Atorvastatin] Other (See Comments)    MUSCLE SPASMS  . Simvastatin Other (See Comments)    Muscle and joint pain  . Cardizem [Diltiazem Hcl] Hives  . Contrast Media [Iodinated Diagnostic Agents]     FYI - during admission at [facility] 08/2016 patient developed hives, question related to cardizem or contrast dye - not clear  . Crestor [Rosuvastatin Calcium]     fagitue and joint pain, "NO STATINS"   Current Outpatient Prescriptions on File Prior to Visit  Medication Sig Dispense Refill  . albuterol (PROVENTIL HFA;VENTOLIN HFA) 108 (90 BASE) MCG/ACT inhaler Inhale 1-2 puffs into the lungs every 6 (six) hours as needed for  wheezing or shortness of breath. 3 Inhaler 4  . amiodarone (PACERONE) 200 MG tablet Take 1 tablet (200 mg total) by mouth daily. 90 tablet 3  . apixaban (ELIQUIS) 5 MG TABS tablet Take 1 tablet (5 mg total) by mouth 2 (two) times daily. 60 tablet 0  . azelastine (ASTELIN) 0.1 % nasal spray Place 2 sprays into the nose daily. Use in each nostril as directed (Patient taking differently: Place 2 sprays into both nostrils daily. ) 90 mL 1  . benzonatate (TESSALON) 100 MG capsule Take 1 capsule (100 mg total) by mouth 3 (three) times daily as needed for cough. 90 capsule 0  . budesonide-formoterol (SYMBICORT) 160-4.5 MCG/ACT inhaler Inhale 2 puffs into the lungs 2 (two) times daily. 3 Inhaler 4  . buPROPion (WELLBUTRIN XL) 150 MG 24 hr tablet Take 1 tablet (150 mg total) by mouth daily. 30 tablet 11  . Calcium Carbonate-Vitamin D (CALCIUM + D PO) Take  1 tablet by mouth 2 (two) times daily.     . cyclobenzaprine (FLEXERIL) 5 MG tablet Take 1 tablet (5 mg total) by mouth 3 (three) times daily as needed for muscle spasms. 270 tablet 0  . fluticasone (FLONASE) 50 MCG/ACT nasal spray Place 1 spray into both nostrils 2 (two) times daily. 48 g 1  . furosemide (LASIX) 20 MG tablet Take 1 tablet (20 mg total) by mouth as needed (swelling). 90 tablet 0  . guaiFENesin (MUCINEX) 600 MG 12 hr tablet Take 1,200 mg by mouth 2 (two) times daily as needed for cough or to loosen phlegm.    . Lidocaine HCl 2 % CREA Apply 1 application topically daily as needed (for shingles flares).    . LORazepam (ATIVAN) 0.5 MG tablet Take 1 tablet (0.5 mg total) by mouth at bedtime. 30 tablet 5  . Melatonin 5 MG TABS Take 5 mg by mouth at bedtime.    . metoprolol succinate (TOPROL-XL) 100 MG 24 hr tablet Take 1 tablet (100 mg total) by mouth 2 (two) times daily. Take with or immediately following a meal. 180 tablet 3  . mirabegron ER (MYRBETRIQ) 50 MG TB24 tablet Take 50 mg by mouth daily.    . montelukast (SINGULAIR) 10 MG tablet Take  10 mg by mouth daily.    . Multiple Vitamin (MULTIVITAMIN) tablet Take 1 tablet by mouth daily.      Marland Kitchen nystatin (MYCOSTATIN) 100000 UNIT/ML suspension Take 5 cc by mouth, swish and swallow 4 times daily. 280 mL 0  . omeprazole (PRILOSEC) 40 MG capsule Take 1 capsule (40 mg total) by mouth daily. 90 capsule 3  . predniSONE (DELTASONE) 10 MG tablet Take 4 tabs x 1 day,3 tabs x 1 day, 2 tabs x 1 day,then 1 tab x 2 days then stop. 11 tablet 0  . promethazine-codeine (PHENERGAN WITH CODEINE) 6.25-10 MG/5ML syrup Take 5 mLs by mouth every 6 (six) hours as needed for cough. 240 mL 0  . Respiratory Therapy Supplies (FLUTTER) DEVI Use 2-3 times per day 1 each 0  . Spacer/Aero-Holding Chambers (AEROCHAMBER Z-STAT PLUS) inhaler Use as instructed 1 each 0  . Tiotropium Bromide Monohydrate (SPIRIVA RESPIMAT) 2.5 MCG/ACT AERS Inhale 2 puffs into the lungs daily. 3 Inhaler 3  . ezetimibe (ZETIA) 10 MG tablet Take 1 tablet (10 mg total) by mouth daily. 90 tablet 3  . [DISCONTINUED] valsartan (DIOVAN) 160 MG tablet Take 1 tablet (160 mg total) by mouth daily. 90 tablet 3   Current Facility-Administered Medications on File Prior to Visit  Medication Dose Route Frequency Provider Last Rate Last Dose  . 0.9 %  sodium chloride infusion  500 mL Intravenous Continuous Irene Shipper, MD       Review of Systems  Constitutional: Negative for unusual diaphoresis or night sweats HENT: Negative for ear swelling or discharge Eyes: Negative for worsening visual haziness  Respiratory: Negative for choking and stridor.   Gastrointestinal: Negative for distension or worsening eructation Genitourinary: Negative for retention or change in urine volume.  Musculoskeletal: Negative for other MSK pain or swelling Skin: Negative for color change and worsening wound Neurological: Negative for tremors and numbness other than noted  Psychiatric/Behavioral: Negative for decreased concentration or agitation other than above   All other  system neg per pt    Objective:   Physical Exam BP 120/70   Pulse 78   Temp 98.4 F (36.9 C) (Oral)   Resp 20   Wt 128 lb (58.1 kg)  LMP  (Exact Date)   SpO2 92%   BMI 21.97 kg/m  VS noted,  Constitutional: Pt appears in no apparent distress HENT: Head: NCAT.  Right Ear: External ear normal.  Left Ear: External ear normal.  Eyes: . Pupils are equal, round, and reactive to light. Conjunctivae and EOM are normal Neck: Normal range of motion. Neck supple.  Cardiovascular: Normal rate and regular rhythm.   Pulmonary/Chest: Effort normal and breath sounds without rales or wheezing.  Abd:  Soft, NT, ND, + BS Neurological: Pt is alert. Not confused , motor grossly intact Skin: Skin is warm. No rash, no LE edema Psychiatric: Pt behavior is normal. No agitation.  No other new exam findings    Assessment & Plan:  f

## 2016-12-09 NOTE — Patient Instructions (Signed)
No new medications or treatment today  Please continue all other medications as before, and refills have been done if requested.  Please have the pharmacy call with any other refills you may need.  Please keep your appointments with your specialists as you may have planned, as well as your wellness visit soon  Please return in 4 months, or sooner if needed

## 2016-12-09 NOTE — Progress Notes (Signed)
Pre visit review using our clinic review tool, if applicable. No additional management support is needed unless otherwise documented below in the visit note. 

## 2016-12-10 ENCOUNTER — Encounter: Payer: Self-pay | Admitting: Internal Medicine

## 2016-12-10 NOTE — Assessment & Plan Note (Signed)
@  LASTSAO2(3)@ stable overall by history and exam, recent data reviewed with pt, and pt to continue medical treatment as before,  to f/u any worsening symptoms or concerns

## 2016-12-10 NOTE — Assessment & Plan Note (Signed)
stable overall by history and exam, recent data reviewed with pt, and pt to continue medical treatment as before,  to f/u any worsening symptoms or concerns Lab Results  Component Value Date   LDLCALC 165 (H) 11/12/2015   For low chol diet, intolerant statins

## 2016-12-10 NOTE — Assessment & Plan Note (Signed)
Lab Results  Component Value Date   HGBA1C 5.9 11/12/2015   stable overall by history and exam, recent data reviewed with pt, and pt to continue medical treatment as before,  to f/u any worsening symptoms or concerns  

## 2016-12-10 NOTE — Assessment & Plan Note (Signed)
stable overall by history and exam, recent data reviewed with pt, and pt to continue medical treatment as before,  to f/u any worsening symptoms or concerns BP Readings from Last 3 Encounters:  12/09/16 120/70  11/17/16 (!) 115/57  11/10/16 128/64

## 2016-12-12 MED ORDER — APIXABAN 5 MG PO TABS
5.0000 mg | ORAL_TABLET | Freq: Two times a day (BID) | ORAL | 1 refills | Status: DC
Start: 2016-12-12 — End: 2017-06-12

## 2016-12-13 ENCOUNTER — Telehealth: Payer: Self-pay | Admitting: Internal Medicine

## 2016-12-13 DIAGNOSIS — I110 Hypertensive heart disease with heart failure: Secondary | ICD-10-CM | POA: Diagnosis not present

## 2016-12-13 DIAGNOSIS — J449 Chronic obstructive pulmonary disease, unspecified: Secondary | ICD-10-CM | POA: Diagnosis not present

## 2016-12-13 DIAGNOSIS — I509 Heart failure, unspecified: Secondary | ICD-10-CM | POA: Diagnosis not present

## 2016-12-13 DIAGNOSIS — R269 Unspecified abnormalities of gait and mobility: Secondary | ICD-10-CM | POA: Diagnosis not present

## 2016-12-13 DIAGNOSIS — I4891 Unspecified atrial fibrillation: Secondary | ICD-10-CM | POA: Diagnosis not present

## 2016-12-13 DIAGNOSIS — Z9181 History of falling: Secondary | ICD-10-CM | POA: Diagnosis not present

## 2016-12-13 MED ORDER — CHOLESTYRAMINE 4 G PO PACK: 4.0000 g | PACK | Freq: Two times a day (BID) | ORAL | 3 refills | Status: DC

## 2016-12-13 NOTE — Telephone Encounter (Signed)
Pt had Ileostomy reversal surgery a couple of weeks ago and wound up with cdiff. She was treated with Flagyl and vanc and has completed the treatment. She in no longer having diarrhea but is having 2-5 soft BM's per day. She does not have much of an appetite, she tried ensure and it "runs right through her." She is down to 121 pounds. Home health nurse calling to see if pt perhaps should add some fiber to her diet, or try a different supplement. Please advise.

## 2016-12-13 NOTE — Telephone Encounter (Signed)
Check stool for C. difficile, start Questran 4 g twice daily (not within 2 hours of other medications), and have her see a GI extender later this week

## 2016-12-13 NOTE — Telephone Encounter (Signed)
Left message for pt to call back.  Pt states she is not having diarrhea so lab will not be able to run test for cdiff. Pt aware of appt, script sent to pharmacy.

## 2016-12-14 ENCOUNTER — Encounter: Payer: Self-pay | Admitting: Gastroenterology

## 2016-12-14 ENCOUNTER — Ambulatory Visit (INDEPENDENT_AMBULATORY_CARE_PROVIDER_SITE_OTHER): Payer: Medicare Other | Admitting: Gastroenterology

## 2016-12-14 ENCOUNTER — Ambulatory Visit: Payer: Medicare Other | Admitting: Internal Medicine

## 2016-12-14 VITALS — BP 90/60 | HR 68 | Ht 64.0 in | Wt 125.0 lb

## 2016-12-14 DIAGNOSIS — R197 Diarrhea, unspecified: Secondary | ICD-10-CM

## 2016-12-14 DIAGNOSIS — R110 Nausea: Secondary | ICD-10-CM | POA: Diagnosis not present

## 2016-12-14 DIAGNOSIS — R634 Abnormal weight loss: Secondary | ICD-10-CM | POA: Insufficient documentation

## 2016-12-14 MED ORDER — PROMETHAZINE HCL 12.5 MG PO TABS: 12.5000 mg | ORAL_TABLET | Freq: Four times a day (QID) | ORAL | 0 refills | Status: DC | PRN

## 2016-12-14 NOTE — Progress Notes (Signed)
Reviewed

## 2016-12-14 NOTE — Progress Notes (Signed)
12/14/2016 [name] [id number] 1941/04/07   History of Present Illness:  This is a 76 year old female who is known to Dr. Henrene Pastor. She is here today for complaints of nausea, diarrhea, and 25 pound weight loss. She underwent emergency surgery for right sided colon ischemia in September 2017 which required a right hemicolectomy and temporary ileostomy. She then had her ileostomy reversed on 11/21/2016. All these surgeries were performed at [facility]. While she was there for her ileostomy reversal she contracted C. difficile infection. Was treated for that with a course of Flagyl and vancomycin, which she completed on January 4. She continues to have some diarrhea although is much improved from previous. She also complains of ongoing nausea and says that she has lost 25 pounds over the past 3 months. When she called our office yesterday and we had requested a repeat C. difficile stool study, but she said that it was no longer liquid so she didn't know if it could be performed. She was also prescribed Questran BID, which she took one time last night and one time this morning.  She says that she feels like the Questran seems to bloat her up and make her unable to eat. Prior to starting Questran she was eating some food in small amounts. They're mostly concerned about her weight loss.  Is taking Culturelle probiotic twice a day.  She says that she has something for nausea at home that hasn't seemed to help. Is unsure what is called, but sounds like Zofran from what she is describing.  Current Medications, Allergies, Past Medical History, Past Surgical History, Family History and Social History were reviewed in [employer] record.   Physical Exam: BP 90/60   Pulse 68   Ht 5\' 4"  (1.626 m)   Wt 125 lb (56.7 kg)   LMP  (Exact Date)   BMI 21.46 kg/m  General: Well developed white female in no acute distress Head: Normocephalic and atraumatic Eyes:  Sclerae  anicteric, conjunctiva pink  Ears: Normal auditory acuity Lungs: Clear throughout to auscultation; no increased WOB. Heart: Regular rate and rhythm Abdomen: Soft, non-distended.  Normal bowel sounds.  Non-tender.  Bandages still in place from surgery; dry and intact. Musculoskeletal: Symmetrical with no gross deformities  Extremities: No edema  Neurological: Alert oriented x 4, grossly non-focal Psychological:  Alert and cooperative. Normal mood and affect  Assessment and Recommendations: -76 year old female with an episode of acute right colon ischemia in September 2017 which required right hemicolectomy and ileostomy. Ileostomy was reversed in December. Developed C. difficile during the hospitalization. Now has had ongoing nausea, some diarrhea, and 25 pound weight loss.  Her weight loss is secondary to poor by mouth intake and decreased appetite related to everything her body has been undergoing recently. Ongoing diarrhea may be C. difficile, but possibly just postinfectious as it has improved some.  I have discussed with her about holding the Questran. If diarrhea returns then she will perform the C. difficile stool study. We'll see how she does without antidiarrheals for couple of days. She'll continue probiotic. Is currently taking Culturelle twice a day.  I discussed with her about eating a bland, lactose free diet with very small frequent meals throughout the day. I'm giving her a prescription for Phenergan to try as needed for nausea. -Chronic anticoagulation on Eliquis for history of paroxysmal atrial fibrillation. -CKD stage 3 -CHF with EF of 45-50%  *She will call back next week with an update on her  symptoms.  I gave her reassurance and told her that it may just take some time for her body to heal and recover from these recent events.

## 2016-12-14 NOTE — Patient Instructions (Addendum)
Your physician has requested that you go to the basement for the following lab work before leaving today:  C diff   We have given you a prescription for the following medications:  Phenergan   As discussed with Janett Billow:  Discontinue your Questran  Eat a bland diet, no lactose, very small, frequent meals  Continue your probiotic

## 2016-12-16 ENCOUNTER — Other Ambulatory Visit: Payer: Medicare Other

## 2016-12-16 ENCOUNTER — Telehealth: Payer: Self-pay | Admitting: Gastroenterology

## 2016-12-16 ENCOUNTER — Other Ambulatory Visit: Payer: Self-pay

## 2016-12-16 DIAGNOSIS — Z9889 Other specified postprocedural states: Principal | ICD-10-CM

## 2016-12-16 DIAGNOSIS — R197 Diarrhea, unspecified: Secondary | ICD-10-CM

## 2016-12-16 NOTE — Telephone Encounter (Signed)
Patient reports that is c-diff is positive she requests no metronidazole.

## 2016-12-17 ENCOUNTER — Telehealth: Payer: Self-pay | Admitting: Internal Medicine

## 2016-12-17 ENCOUNTER — Telehealth: Payer: Self-pay | Admitting: Family Medicine

## 2016-12-17 LAB — CLOSTRIDIUM DIFFICILE BY PCR: Toxigenic C. Difficile by PCR: DETECTED — CR

## 2016-12-17 MED ORDER — VANCOMYCIN HCL 125 MG PO CAPS: 125.0000 mg | ORAL_CAPSULE | Freq: Four times a day (QID) | ORAL | 0 refills | Status: DC

## 2016-12-17 NOTE — Telephone Encounter (Signed)
Call from [facility] regarding pos c diff test  She has a complex GI hx and has been tx before-states she cannot tolerate flagyl Will send in oral vanco to her [facility] QID Disc hygiene/ prev of transmission to family  She says she has f/u with Dr Jenny Reichmann on Thursday-enc her to keep that appt  Will update if her symptoms worsen   She states she still has diarrhea   Will route to GI and [facility]

## 2016-12-19 DIAGNOSIS — R269 Unspecified abnormalities of gait and mobility: Secondary | ICD-10-CM | POA: Diagnosis not present

## 2016-12-19 DIAGNOSIS — Z9181 History of falling: Secondary | ICD-10-CM | POA: Diagnosis not present

## 2016-12-19 DIAGNOSIS — I110 Hypertensive heart disease with heart failure: Secondary | ICD-10-CM | POA: Diagnosis not present

## 2016-12-19 DIAGNOSIS — I509 Heart failure, unspecified: Secondary | ICD-10-CM | POA: Diagnosis not present

## 2016-12-19 DIAGNOSIS — I4891 Unspecified atrial fibrillation: Secondary | ICD-10-CM | POA: Diagnosis not present

## 2016-12-19 DIAGNOSIS — J449 Chronic obstructive pulmonary disease, unspecified: Secondary | ICD-10-CM | POA: Diagnosis not present

## 2016-12-19 NOTE — Progress Notes (Signed)
Pre visit review using our clinic review tool, if applicable. No additional management support is needed unless otherwise documented below in the visit note. 

## 2016-12-19 NOTE — Progress Notes (Addendum)
Subjective:   [name] is a 76 y.o. female who presents for an Initial Medicare Annual Wellness Visit. Patient states she currently is being treated for C-Diff. Patient is wearing facial mask and reports washing her hands frequently.   The Patient was informed that the wellness visit is to identify future health risk and educate and initiate measures that can reduce risk for increased disease through the lifespan.    Review of Systems    No ROS.  Medicare Wellness Visit.  Cardiac Risk Factors include: advanced age (>39men, >12 women);dyslipidemia;family history of premature cardiovascular disease;hypertension   Sleep patterns: sleeps 8 hours. Up to void x 3.  Home Safety/Smoke Alarms:  Smoke detectors and security in place.  Living environment; residence and Firearm Safety: Lives with husband and daughter in a 1 story home. Firearms locked away.  Seat Belt Safety/Bike Helmet: Wears seat belt.   Counseling:   Eye Exam-Last exam 2017, yearly by Dr. Renaldo Fiddler ([place]) Dental-Last exam > 1 year. Seen with issues only.   Female:   Pap-08/25/2010       Mammo-10/15/2015, negative. Order placed.  Dexa scan-02/12/2015, normal. Order placed.  CCS-colonoscopy 05/14/2013, diverticulosis. No recall.       Objective:    Today's Vitals   12/20/16 0912  BP: (!) 120/58  Pulse: 69  SpO2: 95%  Weight: 126 lb (57.2 kg)  Height: 5\' 4"  (1.626 m)   Body mass index is 21.63 kg/m.   Current Medications (verified) Outpatient Encounter Prescriptions as of 12/20/2016  Medication Sig  . albuterol (PROVENTIL HFA;VENTOLIN HFA) 108 (90 BASE) MCG/ACT inhaler Inhale 1-2 puffs into the lungs every 6 (six) hours as needed for wheezing or shortness of breath.  Marland Kitchen amiodarone (PACERONE) 200 MG tablet Take 1 tablet (200 mg total) by mouth daily.  Marland Kitchen apixaban (ELIQUIS) 5 MG TABS tablet Take 1 tablet (5 mg total) by mouth 2 (two) times daily.  Marland Kitchen azelastine (ASTELIN) 0.1 % nasal spray Place 2 sprays into  the nose daily. Use in each nostril as directed (Patient taking differently: Place 2 sprays into both nostrils daily. )  . benzonatate (TESSALON) 100 MG capsule Take 1 capsule (100 mg total) by mouth 3 (three) times daily as needed for cough.  . budesonide-formoterol (SYMBICORT) 160-4.5 MCG/ACT inhaler Inhale 2 puffs into the lungs 2 (two) times daily.  Marland Kitchen buPROPion (WELLBUTRIN XL) 150 MG 24 hr tablet Take 1 tablet (150 mg total) by mouth daily.  . cyclobenzaprine (FLEXERIL) 5 MG tablet Take 1 tablet (5 mg total) by mouth 3 (three) times daily as needed for muscle spasms.  . fluticasone (FLONASE) 50 MCG/ACT nasal spray Place 1 spray into both nostrils 2 (two) times daily.  . furosemide (LASIX) 20 MG tablet Take 1 tablet (20 mg total) by mouth as needed (swelling).  Marland Kitchen guaiFENesin (MUCINEX) 600 MG 12 hr tablet Take 1,200 mg by mouth 2 (two) times daily as needed for cough or to loosen phlegm.  . Lidocaine HCl 2 % CREA Apply 1 application topically daily as needed (for shingles flares).  . LORazepam (ATIVAN) 0.5 MG tablet Take 1 tablet (0.5 mg total) by mouth at bedtime.  . Melatonin 5 MG TABS Take 5 mg by mouth at bedtime.  . metoprolol succinate (TOPROL-XL) 100 MG 24 hr tablet Take 1 tablet (100 mg total) by mouth 2 (two) times daily. Take with or immediately following a meal.  . mirabegron ER (MYRBETRIQ) 50 MG TB24 tablet Take 50 mg by mouth daily.  Marland Kitchen  montelukast (SINGULAIR) 10 MG tablet Take 10 mg by mouth daily.  Marland Kitchen nystatin (MYCOSTATIN) 100000 UNIT/ML suspension Take 5 cc by mouth, swish and swallow 4 times daily.  Marland Kitchen omeprazole (PRILOSEC) 40 MG capsule Take 1 capsule (40 mg total) by mouth daily.  . Probiotic Product (PROBIOTIC DAILY PO) Take by mouth.  . promethazine (PHENERGAN) 12.5 MG tablet Take 1 tablet (12.5 mg total) by mouth every 6 (six) hours as needed for nausea or vomiting.  . promethazine-codeine (PHENERGAN WITH CODEINE) 6.25-10 MG/5ML syrup Take 5 mLs by mouth every 6 (six) hours as  needed for cough.  Marland Kitchen Respiratory Therapy Supplies (FLUTTER) DEVI Use 2-3 times per day  . Spacer/Aero-Holding Chambers (AEROCHAMBER Z-STAT PLUS) inhaler Use as instructed  . Tiotropium Bromide Monohydrate (SPIRIVA RESPIMAT) 2.5 MCG/ACT AERS Inhale 2 puffs into the lungs daily.  . vancomycin (VANCOCIN) 125 MG capsule Take 1 capsule (125 mg total) by mouth 4 (four) times daily.  . Calcium Carbonate-Vitamin D (CALCIUM + D PO) Take 1 tablet by mouth 2 (two) times daily.   . cholestyramine (QUESTRAN) 4 g packet Take 1 packet (4 g total) by mouth 2 (two) times daily. (Patient not taking: Reported on 12/20/2016)  . ezetimibe (ZETIA) 10 MG tablet Take 1 tablet (10 mg total) by mouth daily.  . Multiple Vitamin (MULTIVITAMIN) tablet Take 1 tablet by mouth daily.     Facility-Administered Encounter Medications as of 12/20/2016  Medication  . 0.9 %  sodium chloride infusion    Allergies (verified) Clarithromycin; Lipitor [atorvastatin]; Simvastatin; Cardizem [diltiazem hcl]; Contrast media [iodinated diagnostic agents]; and Crestor [rosuvastatin calcium]   History: Past Medical History:  Diagnosis Date  . Allergic rhinitis   . Anxiety   . Atrial septal aneurysm   . Basal cell carcinoma   . Bradycardia   . Cataract    bil cataracts removed  . Chronic combined systolic and diastolic CHF (congestive heart failure) ([place])   . CKD (chronic kidney disease), stage III   . Clostridium difficile infection   . COPD (chronic obstructive pulmonary disease) ([place])   . Depression 11/29/2013  . DI (detrusor instability)   . Esophageal stricture   . GERD (gastroesophageal reflux disease)   . Hemorrhoids   . Hiatal hernia   . History of kidney stones   . HLD (hyperlipidemia)   . HTN (hypertension)   . Ischemic colon ([place])    a. 08/2016 - adm to  [facility] with a ruptured colon s/p right hemicolectomy with ileostomy formation for ischemic and necrotic colon.   . Lung nodule    a. followed by  pulmonology  . Mild CAD    a. minor nonobstructive CAD 08/22/16.  . Nephrolithiasis    hx  . NICM (nonischemic cardiomyopathy) ([place])   . Orthostatic hypotension   . Osteoarthritis   . PAF (paroxysmal atrial fibrillation) ([place])    a. diagnosed 07/2016 s/p DCCV 08/01/16 with recurrence, managed with rate control for now.   . Pneumonia 2013  . PVC's (premature ventricular contractions)   . Shingles since Nov 18, 2013   on right arm from shoulder to wrist  . Status post dilation of esophageal narrowing   . Stroke [facility]) 2001   STROKES, TIA'S  . Syncope    a. 08/2016 felt due to orthostatic hypotension.  Marland Kitchen TIA (transient ischemic attack) last 2001   anticoagulation therapy on coumadin   Past Surgical History:  Procedure Laterality Date  . ANTERIOR AND POSTERIOR VAGINAL REPAIR  2008   A&P  REPAIR-DR MCDIARMID  . ANTERIOR LATERAL LUMBAR FUSION 4 LEVELS Right 07/03/2015   Procedure: Right Lumbar one-two, Lumbar two-three, Lumbar three-four, Lumbar four-five Anterior lateral lumbar interbody fusion;  Surgeon: Erline Levine, MD;  Location: [place] NEURO ORS;  Service: Neurosurgery;  Laterality: Right;  Right L1-2 L2-3 L3-4 L4-5 Anterior lateral lumbar interbody fusion  . BALLOON DILATION N/A 03/03/2014   Procedure: BALLOON DILATION;  Surgeon: Inda Castle, MD;  Location: [facility] ENDOSCOPY;  Service: Endoscopy;  Laterality: N/A;  . BLADDER SUSPENSION    . CARDIAC CATHETERIZATION N/A 08/22/2016   Procedure: Left Heart Cath and Coronary Angiography;  Surgeon: Peter M Martinique, MD;  Location: [place] CV LAB;  Service: Cardiovascular;  Laterality: N/A;  . CARDIOVERSION N/A 08/01/2016   Procedure: CARDIOVERSION;  Surgeon: Jerline Pain, MD;  Location: [place];  Service: Cardiovascular;  Laterality: N/A;  . CARDIOVERSION N/A 10/11/2016   Procedure: CARDIOVERSION;  Surgeon: Sueanne Margarita, MD;  Location: [facility] ENDOSCOPY;  Service: Cardiovascular;  Laterality: N/A;  . COLONOSCOPY    . ESOPHAGOGASTRODUODENOSCOPY  N/A 03/03/2014   Procedure: ESOPHAGOGASTRODUODENOSCOPY (EGD);  Surgeon: Inda Castle, MD;  Location: [facility];  Service: Endoscopy;  Laterality: N/A;  . EXCISION OF BASAL CELL CA  2011  . GLUTEUS MINIMUS REPAIR Right 03/15/2016   Procedure: RIGHT HIP GLUTEUS MEDIUS TENDON REPAIR;  Surgeon: Paralee Cancel, MD;  Location: [facility] OR[facility];  Service: Orthopedics;  Laterality: Right;  . ileostomy reversal    . LUMBAR PERCUTANEOUS PEDICLE SCREW 4 LEVEL Right 07/03/2015   Procedure: Lumbar one-Sacral one Bilateral percutaneous pedicle screws;  Surgeon: Erline Levine, MD;  Location: [place] NEURO ORS;  Service: Neurosurgery;  Laterality: Right;  L1-S1 Bilateral percutaneous pedicle screws  . RECTOCELE REPAIR  2008   A&P REPAIR -DR. MCDIARMID  . THUMB SURGERY Right 10-15 yrs ago  . UPPER GASTROINTESTINAL ENDOSCOPY    . VAGINAL HYSTERECTOMY  1983   NO BSO-DR. MABRY  . VARICOSE VEIN SURGERY     Family History  Problem Relation Age of Onset  . COPD Mother   . Breast cancer Mother     mid 59's  . Uterine cancer Mother   . Emphysema Mother   . Breast cancer Maternal Aunt     x 3 aunts, post menopausal  . Colon cancer Neg Hx   . Esophageal cancer Neg Hx   . Rectal cancer Neg Hx   . Stomach cancer Neg Hx   . Pancreatic cancer Neg Hx    Social History   Occupational History  . retired Retired   Social History Main Topics  . Smoking status: Former Smoker    Packs/day: 3.00    Years: 42.00    Types: Cigarettes    Start date: 08/04/1954    Quit date: 12/06/1995  . Smokeless tobacco: Never Used  . Alcohol use No     Comment: occas very little  . Drug use: No  . Sexual activity: Not Currently    Birth control/ protection: Surgical    Tobacco Counseling Counseling given: Not Answered   Activities of Daily Living In your present state of health, do you have any difficulty performing the following activities: 12/20/2016 09/26/2016  Hearing? N N  Vision? N Y  Difficulty concentrating or making  decisions? N N  Walking or climbing stairs? Y N  Dressing or bathing? N N  Doing errands, shopping? N Y  Conservation officer, nature and eating ? N N  Using the Toilet? N N  In the past six months,  have you accidently leaked urine? Y Y  Do you have problems with loss of bowel control? N Y  Managing your Medications? N N  Managing your Finances? N N  Housekeeping or managing your Housekeeping? N N  Some recent data might be hidden    Immunizations and Health Maintenance Immunization History  Administered Date(s) Administered  . Influenza Split 09/11/2012  . Influenza Whole 09/15/2006, 08/04/2010, 08/11/2011  . Influenza,inj,Quad PF,36+ Mos 09/02/2013, 08/26/2014, 08/19/2015, 08/23/2016  . Pneumococcal Conjugate-13 11/29/2013  . Pneumococcal Polysaccharide-23 12/06/2003, 12/05/2006  . Td 12/05/2004  . Zoster 07/07/2008   Health Maintenance Due  Topic Date Due  . PAP SMEAR  08/25/2012  . TETANUS/TDAP  12/05/2014    Patient Care Team: Biagio Borg, MD as PCP - General Irene Shipper, MD as Consulting Physician (Gastroenterology) Will Meredith Leeds, MD as Consulting Physician (Cardiology) Vianne Bulls. Coralie Keens, DO as Referring Physician (Surgery) Anastasio Auerbach, MD as Consulting Physician (Gynecology) Lynnell Dike, OD (Optometry)  Indicate any recent Medical Services you may have received from other than [facility] providers in the past year (date may be approximate).     Assessment:   This is a routine wellness examination for [place]. Physical assessment deferred to PCP.   Hearing/Vision screen Hearing Screening Comments: Able to hear conversational tones w/o difficulty. No issues reported.   Vision Screening Comments: Wears reading glasses.    Dietary issues and exercise activities discussed: Current Exercise Habits: The patient does not participate in regular exercise at present (does a little housework as tolerated. PT to re-evaluate next week), Exercise limited by: cardiac  condition(s);respiratory conditions(s);Other - see comments (post surgical)   Diet (meal preparation, eat out, water intake, caffeinated beverages, dairy products, fruits and vegetables): Drinks juice and water.   Breakfast: egg, toast, bacon, juice Lunch: soup, sandwich Dinner: soup, seafood, [place], vegetables  Discussed healthy food choices once feeling better. Encouraged to continue to be as active as possible.   Goals      Patient Stated   . <enter goal here> (pt-stated)          "to get well again"      Depression Screen PHQ 2/9 Scores 12/20/2016 09/26/2016 12/02/2014 11/29/2013  PHQ - 2 Score 6 5 1 1   PHQ- 9 Score 12 14 - -    Fall Risk Fall Risk  12/20/2016 09/26/2016 12/02/2014 11/29/2013  Falls in the past year? Yes Yes Yes No  Number falls in past yr: 1 1 1  -  Injury with Fall? No No No -  Follow up Falls prevention discussed Falls prevention discussed - -    Cognitive Function:       Ad8 score reviewed for issues:  Issues making decisions:no  Less interest in hobbies / activities:no  Repeats questions, stories (family complaining):no  Trouble using ordinary gadgets (microwave, computer, phone):no  Forgets the month or year: no  Mismanaging finances: no  Remembering appts:no  Daily problems with thinking and/or memory:no Ad8 score is=0     Screening Tests Health Maintenance  Topic Date Due  . PAP SMEAR  08/25/2012  . TETANUS/TDAP  12/05/2014  . INFLUENZA VACCINE  Completed  . DEXA SCAN  Completed  . ZOSTAVAX  Completed  . PNA vac Low Risk Adult  Completed      Plan:     Attempt to eat heart healthy diet (full of fruits, vegetables, whole grains, lean protein, water--limit salt, fat, and sugar intake) and increase physical activity as tolerated.  Continue doing  brain stimulating activities (puzzles, reading, adult coloring books, staying active) to keep memory sharp.    During the course of the visit, Brodie was educated and  counseled about the following appropriate screening and preventive services:   Vaccines to include Pneumoccal, Influenza, Hepatitis B, Td, Zostavax, HCV  Cardiovascular disease screening  Colorectal cancer screening  Bone density screening  Diabetes screening  Glaucoma screening  Mammography/PAP  Nutrition counseling  Patient Instructions (the written plan) were given to the patient.    Gerilyn Nestle, RN   12/20/2016    Medical screening examination/treatment/procedure(s) were performed by non-physician practitioner and as supervising physician I was immediately available for consultation/collaboration. I agree with above. Cathlean Cower, MD

## 2016-12-20 ENCOUNTER — Telehealth: Payer: Self-pay | Admitting: Internal Medicine

## 2016-12-20 ENCOUNTER — Ambulatory Visit (INDEPENDENT_AMBULATORY_CARE_PROVIDER_SITE_OTHER): Payer: Medicare Other

## 2016-12-20 VITALS — BP 120/58 | HR 69 | Ht 64.0 in | Wt 126.0 lb

## 2016-12-20 DIAGNOSIS — Z1239 Encounter for other screening for malignant neoplasm of breast: Secondary | ICD-10-CM

## 2016-12-20 DIAGNOSIS — Z0000 Encounter for general adult medical examination without abnormal findings: Secondary | ICD-10-CM | POA: Diagnosis not present

## 2016-12-20 DIAGNOSIS — Z1231 Encounter for screening mammogram for malignant neoplasm of breast: Secondary | ICD-10-CM

## 2016-12-20 DIAGNOSIS — E2839 Other primary ovarian failure: Secondary | ICD-10-CM

## 2016-12-20 NOTE — Telephone Encounter (Signed)
Alexander for verbal if this works, thanks

## 2016-12-20 NOTE — Telephone Encounter (Signed)
Requesting order to start PT.

## 2016-12-20 NOTE — Telephone Encounter (Signed)
Verbals given  

## 2016-12-20 NOTE — Patient Instructions (Addendum)
Attempt to eat heart healthy diet (full of fruits, vegetables, whole grains, lean protein, water--limit salt, fat, and sugar intake) and increase physical activity as tolerated.  Continue doing brain stimulating activities (puzzles, reading, adult coloring books, staying active) to keep memory sharp.   Fall Prevention in the Home Introduction Falls can cause injuries. They can happen to people of all ages. There are many things you can do to make your home safe and to help prevent falls. What can I do on the outside of my home?  Regularly fix the edges of walkways and driveways and fix any cracks.  Remove anything that might make you trip as you walk through a door, such as a raised step or threshold.  Trim any bushes or trees on the path to your home.  Use bright outdoor lighting.  Clear any walking paths of anything that might make someone trip, such as rocks or tools.  Regularly check to see if handrails are loose or broken. Make sure that both sides of any steps have handrails.  Any raised decks and porches should have guardrails on the edges.  Have any leaves, snow, or ice cleared regularly.  Use sand or salt on walking paths during winter.  Clean up any spills in your garage right away. This includes oil or grease spills. What can I do in the bathroom?  Use night lights.  Install grab bars by the toilet and in the tub and shower. Do not use towel bars as grab bars.  Use non-skid mats or decals in the tub or shower.  If you need to sit down in the shower, use a plastic, non-slip stool.  Keep the floor dry. Clean up any water that spills on the floor as soon as it happens.  Remove soap buildup in the tub or shower regularly.  Attach bath mats securely with double-sided non-slip rug tape.  Do not have throw rugs and other things on the floor that can make you trip. What can I do in the bedroom?  Use night lights.  Make sure that you have a light by your bed that is  easy to reach.  Do not use any sheets or blankets that are too big for your bed. They should not hang down onto the floor.  Have a firm chair that has side arms. You can use this for support while you get dressed.  Do not have throw rugs and other things on the floor that can make you trip. What can I do in the kitchen?  Clean up any spills right away.  Avoid walking on wet floors.  Keep items that you use a lot in easy-to-reach places.  If you need to reach something above you, use a strong step stool that has a grab bar.  Keep electrical cords out of the way.  Do not use floor polish or wax that makes floors slippery. If you must use wax, use non-skid floor wax.  Do not have throw rugs and other things on the floor that can make you trip. What can I do with my stairs?  Do not leave any items on the stairs.  Make sure that there are handrails on both sides of the stairs and use them. Fix handrails that are broken or loose. Make sure that handrails are as long as the stairways.  Check any carpeting to make sure that it is firmly attached to the stairs. Fix any carpet that is loose or worn.  Avoid having throw rugs  at the top or bottom of the stairs. If you do have throw rugs, attach them to the floor with carpet tape.  Make sure that you have a light switch at the top of the stairs and the bottom of the stairs. If you do not have them, ask someone to add them for you. What else can I do to help prevent falls?  Wear shoes that:  Do not have high heels.  Have rubber bottoms.  Are comfortable and fit you well.  Are closed at the toe. Do not wear sandals.  If you use a stepladder:  Make sure that it is fully opened. Do not climb a closed stepladder.  Make sure that both sides of the stepladder are locked into place.  Ask someone to hold it for you, if possible.  Clearly mark and make sure that you can see:  Any grab bars or handrails.  First and last  steps.  Where the edge of each step is.  Use tools that help you move around (mobility aids) if they are needed. These include:  Canes.  Walkers.  Scooters.  Crutches.  Turn on the lights when you go into a dark area. Replace any light bulbs as soon as they burn out.  Set up your furniture so you have a clear path. Avoid moving your furniture around.  If any of your floors are uneven, fix them.  If there are any pets around you, be aware of where they are.  Review your medicines with your doctor. Some medicines can make you feel dizzy. This can increase your chance of falling. Ask your doctor what other things that you can do to help prevent falls. This information is not intended to replace advice given to you by your health care provider. Make sure you discuss any questions you have with your health care provider. Document Released: 09/17/2009 Document Revised: 04/28/2016 Document Reviewed: 12/26/2014  2017 [employer]  Health Maintenance, Female Introduction Adopting a healthy lifestyle and getting preventive care can go a long way to promote health and wellness. Talk with your health care provider about what schedule of regular examinations is right for you. This is a good chance for you to check in with your provider about disease prevention and staying healthy. In between checkups, there are plenty of things you can do on your own. Experts have done a lot of research about which lifestyle changes and preventive measures are most likely to keep you healthy. Ask your health care provider for more information. Weight and diet Eat a healthy diet  Be sure to include plenty of vegetables, fruits, low-fat dairy products, and lean protein.  Do not eat a lot of foods high in solid fats, added sugars, or salt.  Get regular exercise. This is one of the most important things you can do for your health.  Most adults should exercise for at least 150 minutes each week. The exercise  should increase your heart rate and make you sweat (moderate-intensity exercise).  Most adults should also do strengthening exercises at least twice a week. This is in addition to the moderate-intensity exercise. Maintain a healthy weight  Body mass index (BMI) is a measurement that can be used to identify possible weight problems. It estimates body fat based on height and weight. Your health care provider can help determine your BMI and help you achieve or maintain a healthy weight.  For females 44 years of age and older:  A BMI below 18.5 is considered underweight.  A BMI of 18.5 to 24.9 is normal.  A BMI of 25 to 29.9 is considered overweight.  A BMI of 30 and above is considered obese. Watch levels of cholesterol and blood lipids  You should start having your blood tested for lipids and cholesterol at 78 years of age, then have this test every 5 years.  You may need to have your cholesterol levels checked more often if:  Your lipid or cholesterol levels are high.  You are older than 78 years of age.  You are at high risk for heart disease. Cancer screening Lung Cancer  Lung cancer screening is recommended for adults 6-55 years old who are at high risk for lung cancer because of a history of smoking.  A yearly low-dose CT scan of the lungs is recommended for people who:  Currently smoke.  Have quit within the past 15 years.  Have at least a 30-pack-year history of smoking. A pack year is smoking an average of one pack of cigarettes a day for 1 year.  Yearly screening should continue until it has been 15 years since you quit.  Yearly screening should stop if you develop a health problem that would prevent you from having lung cancer treatment. Breast Cancer  Practice breast self-awareness. This means understanding how your breasts normally appear and feel.  It also means doing regular breast self-exams. Let your health care provider know about any changes, no matter  how small.  If you are in your 20s or 30s, you should have a clinical breast exam (CBE) by a health care provider every 1-3 years as part of a regular health exam.  If you are 36 or older, have a CBE every year. Also consider having a breast X-ray (mammogram) every year.  If you have a family history of breast cancer, talk to your health care provider about genetic screening.  If you are at high risk for breast cancer, talk to your health care provider about having an MRI and a mammogram every year.  Breast cancer gene (BRCA) assessment is recommended for women who have family members with BRCA-related cancers. BRCA-related cancers include:  Breast.  Ovarian.  Tubal.  Peritoneal cancers.  Results of the assessment will determine the need for genetic counseling and BRCA1 and BRCA2 testing. Cervical Cancer  Your health care provider may recommend that you be screened regularly for cancer of the pelvic organs (ovaries, uterus, and vagina). This screening involves a pelvic examination, including checking for microscopic changes to the surface of your cervix (Pap test). You may be encouraged to have this screening done every 3 years, beginning at age 69.  For women ages 7-65, health care providers may recommend pelvic exams and Pap testing every 3 years, or they may recommend the Pap and pelvic exam, combined with testing for human papilloma virus (HPV), every 5 years. Some types of HPV increase your risk of cervical cancer. Testing for HPV may also be done on women of any age with unclear Pap test results.  Other health care providers may not recommend any screening for nonpregnant women who are considered low risk for pelvic cancer and who do not have symptoms. Ask your health care provider if a screening pelvic exam is right for you.  If you have had past treatment for cervical cancer or a condition that could lead to cancer, you need Pap tests and screening for cancer for at least 20  years after your treatment. If Pap tests have been discontinued, your  risk factors (such as having a new sexual partner) need to be reassessed to determine if screening should resume. Some women have medical problems that increase the chance of getting cervical cancer. In these cases, your health care provider may recommend more frequent screening and Pap tests. Colorectal Cancer  This type of cancer can be detected and often prevented.  Routine colorectal cancer screening usually begins at 78 years of age and continues through 78 years of age.  Your health care provider may recommend screening at an earlier age if you have risk factors for colon cancer.  Your health care provider may also recommend using home test kits to check for hidden blood in the stool.  A small camera at the end of a tube can be used to examine your colon directly (sigmoidoscopy or colonoscopy). This is done to check for the earliest forms of colorectal cancer.  Routine screening usually begins at age 4.  Direct examination of the colon should be repeated every 5-10 years through 78 years of age. However, you may need to be screened more often if early forms of precancerous polyps or small growths are found. Skin Cancer  Check your skin from head to toe regularly.  Tell your health care provider about any new moles or changes in moles, especially if there is a change in a mole's shape or color.  Also tell your health care provider if you have a mole that is larger than the size of a pencil eraser.  Always use sunscreen. Apply sunscreen liberally and repeatedly throughout the day.  Protect yourself by wearing long sleeves, pants, a wide-brimmed hat, and sunglasses whenever you are outside. Heart disease, diabetes, and high blood pressure  High blood pressure causes heart disease and increases the risk of stroke. High blood pressure is more likely to develop in:  People who have blood pressure in the high end of  the normal range (130-139/85-89 mm Hg).  People who are overweight or obese.  People who are African American.  If you are 14-61 years of age, have your blood pressure checked every 3-5 years. If you are 86 years of age or older, have your blood pressure checked every year. You should have your blood pressure measured twice-once when you are at a hospital or clinic, and once when you are not at a hospital or clinic. Record the average of the two measurements. To check your blood pressure when you are not at a hospital or clinic, you can use:  An automated blood pressure machine at a pharmacy.  A home blood pressure monitor.  If you are between 40 years and 30 years old, ask your health care provider if you should take aspirin to prevent strokes.  Have regular diabetes screenings. This involves taking a blood sample to check your fasting blood sugar level.  If you are at a normal weight and have a low risk for diabetes, have this test once every three years after 78 years of age.  If you are overweight and have a high risk for diabetes, consider being tested at a younger age or more often. Preventing infection Hepatitis B  If you have a higher risk for hepatitis B, you should be screened for this virus. You are considered at high risk for hepatitis B if:  You were born in a country where hepatitis B is common. Ask your health care provider which countries are considered high risk.  Your parents were born in a high-risk country, and you have  not been immunized against hepatitis B (hepatitis B vaccine).  You have HIV or AIDS.  You use needles to inject street drugs.  You live with someone who has hepatitis B.  You have had sex with someone who has hepatitis B.  You get hemodialysis treatment.  You take certain medicines for conditions, including cancer, organ transplantation, and autoimmune conditions. Hepatitis C  Blood testing is recommended for:  Everyone born from [birth year]  through 1967.  Anyone with known risk factors for hepatitis C. Sexually transmitted infections (STIs)  You should be screened for sexually transmitted infections (STIs) including gonorrhea and chlamydia if:  You are sexually active and are younger than 78 years of age.  You are older than 78 years of age and your health care provider tells you that you are at risk for this type of infection.  Your sexual activity has changed since you were last screened and you are at an increased risk for chlamydia or gonorrhea. Ask your health care provider if you are at risk.  If you do not have HIV, but are at risk, it may be recommended that you take a prescription medicine daily to prevent HIV infection. This is called pre-exposure prophylaxis (PrEP). You are considered at risk if:  You are sexually active and do not regularly use condoms or know the HIV status of your partner(s).  You take drugs by injection.  You are sexually active with a partner who has HIV. Talk with your health care provider about whether you are at high risk of being infected with HIV. If you choose to begin PrEP, you should first be tested for HIV. You should then be tested every 3 months for as long as you are taking PrEP. Pregnancy  If you are premenopausal and you may become pregnant, ask your health care provider about preconception counseling.  If you may become pregnant, take 400 to 800 micrograms (mcg) of folic acid every day.  If you want to prevent pregnancy, talk to your health care provider about birth control (contraception). Osteoporosis and menopause  Osteoporosis is a disease in which the bones lose minerals and strength with aging. This can result in serious bone fractures. Your risk for osteoporosis can be identified using a bone density scan.  If you are 32 years of age or older, or if you are at risk for osteoporosis and fractures, ask your health care provider if you should be screened.  Ask your  health care provider whether you should take a calcium or vitamin D supplement to lower your risk for osteoporosis.  Menopause may have certain physical symptoms and risks.  Hormone replacement therapy may reduce some of these symptoms and risks. Talk to your health care provider about whether hormone replacement therapy is right for you. Follow these instructions at home:  Schedule regular health, dental, and eye exams.  Stay current with your immunizations.  Do not use any tobacco products including cigarettes, chewing tobacco, or electronic cigarettes.  If you are pregnant, do not drink alcohol.  If you are breastfeeding, limit how much and how often you drink alcohol.  Limit alcohol intake to no more than 1 drink per day for nonpregnant women. One drink equals 12 ounces of beer, 5 ounces of wine, or 1 ounces of hard liquor.  Do not use street drugs.  Do not share needles.  Ask your health care provider for help if you need support or information about quitting drugs.  Tell your health care provider if  you often feel depressed.  Tell your health care provider if you have ever been abused or do not feel safe at home. This information is not intended to replace advice given to you by your health care provider. Make sure you discuss any questions you have with your health care provider. Document Released: 06/06/2011 Document Revised: 04/28/2016 Document Reviewed: 08/25/2015  2017 [employer]  

## 2016-12-26 ENCOUNTER — Telehealth: Payer: Self-pay | Admitting: Gastroenterology

## 2016-12-26 DIAGNOSIS — R269 Unspecified abnormalities of gait and mobility: Secondary | ICD-10-CM | POA: Diagnosis not present

## 2016-12-26 DIAGNOSIS — I4891 Unspecified atrial fibrillation: Secondary | ICD-10-CM | POA: Diagnosis not present

## 2016-12-26 DIAGNOSIS — I110 Hypertensive heart disease with heart failure: Secondary | ICD-10-CM | POA: Diagnosis not present

## 2016-12-26 DIAGNOSIS — J449 Chronic obstructive pulmonary disease, unspecified: Secondary | ICD-10-CM | POA: Diagnosis not present

## 2016-12-26 DIAGNOSIS — I509 Heart failure, unspecified: Secondary | ICD-10-CM | POA: Diagnosis not present

## 2016-12-26 DIAGNOSIS — Z9181 History of falling: Secondary | ICD-10-CM | POA: Diagnosis not present

## 2016-12-26 NOTE — Telephone Encounter (Signed)
Diarrhea has gotten better and is formed, tomorrow is the last day of vancomycin, she was on 10 days.  She currently has 4-5 formed bowel movements after eating.  Also, is taking 2 probiotics daily.  Pt states she feels as if she is better but worried she will decline again.  Pt was advised to finish abx and if she does not continue to improve to call us back for an update.  Pt agreed

## 2016-12-27 ENCOUNTER — Telehealth: Payer: Self-pay

## 2016-12-27 DIAGNOSIS — Z9181 History of falling: Secondary | ICD-10-CM | POA: Diagnosis not present

## 2016-12-27 DIAGNOSIS — J449 Chronic obstructive pulmonary disease, unspecified: Secondary | ICD-10-CM | POA: Diagnosis not present

## 2016-12-27 DIAGNOSIS — I110 Hypertensive heart disease with heart failure: Secondary | ICD-10-CM | POA: Diagnosis not present

## 2016-12-27 DIAGNOSIS — I509 Heart failure, unspecified: Secondary | ICD-10-CM | POA: Diagnosis not present

## 2016-12-27 DIAGNOSIS — I4891 Unspecified atrial fibrillation: Secondary | ICD-10-CM | POA: Diagnosis not present

## 2016-12-27 DIAGNOSIS — R269 Unspecified abnormalities of gait and mobility: Secondary | ICD-10-CM | POA: Diagnosis not present

## 2016-12-27 NOTE — Telephone Encounter (Signed)
Please advise Christa from PT is requesting verbal orders for patient to have physical therapy twice a week for four weeks.

## 2016-12-27 NOTE — Telephone Encounter (Signed)
Call back number  [phone]

## 2016-12-27 NOTE — Telephone Encounter (Signed)
Ok for verbals 

## 2016-12-27 NOTE — Telephone Encounter (Signed)
Verbals given  

## 2016-12-29 DIAGNOSIS — Z9181 History of falling: Secondary | ICD-10-CM | POA: Diagnosis not present

## 2016-12-29 DIAGNOSIS — R269 Unspecified abnormalities of gait and mobility: Secondary | ICD-10-CM | POA: Diagnosis not present

## 2016-12-29 DIAGNOSIS — I509 Heart failure, unspecified: Secondary | ICD-10-CM | POA: Diagnosis not present

## 2016-12-29 DIAGNOSIS — I4891 Unspecified atrial fibrillation: Secondary | ICD-10-CM | POA: Diagnosis not present

## 2016-12-29 DIAGNOSIS — I110 Hypertensive heart disease with heart failure: Secondary | ICD-10-CM | POA: Diagnosis not present

## 2016-12-29 DIAGNOSIS — J449 Chronic obstructive pulmonary disease, unspecified: Secondary | ICD-10-CM | POA: Diagnosis not present

## 2017-01-02 ENCOUNTER — Telehealth: Payer: Self-pay | Admitting: Emergency Medicine

## 2017-01-02 NOTE — Telephone Encounter (Signed)
[facility] called and patients heartrate is 35. He wanted to make you aware and if you need to you can give him a call back thanks.

## 2017-01-02 NOTE — Telephone Encounter (Signed)
Called and left message for [facility] to either call back with more information, or if patient is deterorating/symptomatic, he should call 911---if home health calls back, can talk with tamara

## 2017-01-02 NOTE — Telephone Encounter (Signed)
[facility] called back, he has advised patient to hold bp med for evening---states patient is asymptomatic, but advised if she becomes symptomatic, call 911, and make sure she keeps appt with her cardiologist tomorrow

## 2017-01-03 ENCOUNTER — Encounter: Payer: Self-pay | Admitting: Cardiology

## 2017-01-03 ENCOUNTER — Ambulatory Visit (INDEPENDENT_AMBULATORY_CARE_PROVIDER_SITE_OTHER): Payer: Medicare Other | Admitting: Cardiology

## 2017-01-03 VITALS — BP 124/70 | HR 73 | Ht 64.0 in | Wt 125.4 lb

## 2017-01-03 DIAGNOSIS — I480 Paroxysmal atrial fibrillation: Secondary | ICD-10-CM

## 2017-01-03 DIAGNOSIS — I493 Ventricular premature depolarization: Secondary | ICD-10-CM

## 2017-01-03 MED ORDER — AMIODARONE HCL 200 MG PO TABS: 200.0000 mg | ORAL_TABLET | Freq: Every day | ORAL | 3 refills | Status: DC

## 2017-01-03 NOTE — Addendum Note (Signed)
Addended by: Stanton Kidney on: 01/03/2017 05:51 PM   Modules accepted: Orders

## 2017-01-03 NOTE — Progress Notes (Signed)
Electrophysiology Office Note   Date:  01/03/2017   ID:  Ray, Lauren 1941/05/06, MRN [id number]  PCP:  Cathlean Cower, MD  Primary Electrophysiologist:  Eevee Borbon Meredith Leeds, MD    Chief Complaint  Patient presents with  . Follow-up    PVC's/PAF     History of Present Illness: [name] is a 76 y.o. female who presents today for electrophysiology evaluation.   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 Admitted 08/17/16 with ruptured colon status post right hemicolectomy with ileostomy for ischemic and necrotic colon.  Had cardioversion 12/12/15. She is also had an ileostomy takedown and repair. That hospital stay was compensated by C. difficile. Remains in atrial fibrillation today, though her EKG does show evidence of monomorphic PVCs. She says that some days, her heart rate drops into the 30s as well as blood pressure dropping into the 90s. She says that when that happens she does not take her metoprolol. She feels weak and fatigued when her heart rate and blood pressure are low.  Today, she denies symptoms of palpitations, chest pain, shortness of breath, orthopnea, PND, lower extremity edema, claudication, dizziness, presyncope, syncope, bleeding, or neurologic sequela. The patient is tolerating medications without difficulties.    Past Medical History:  Diagnosis Date  . Allergic rhinitis   . Anxiety   . Atrial septal aneurysm   . Basal cell carcinoma   . Bradycardia   . Cataract    bil cataracts removed  . Chronic combined systolic and diastolic CHF (congestive heart failure) ([employer])   . CKD (chronic kidney disease), stage III   . Clostridium difficile  infection   . COPD (chronic obstructive pulmonary disease) ([place])   . Depression 11/29/2013  . DI (detrusor instability)   . Esophageal stricture   . GERD (gastroesophageal reflux disease)   . Hemorrhoids   . Hiatal hernia   . History of kidney stones   . HLD (hyperlipidemia)   . HTN (hypertension)   . Ischemic colon ([place])    a. 08/2016 - adm to  [facility] with a ruptured colon s/p right hemicolectomy with ileostomy formation for ischemic and necrotic colon.   . Lung nodule    a. followed by pulmonology  . Mild CAD    a. minor nonobstructive CAD 08/22/16.  . Nephrolithiasis    hx  . NICM (nonischemic cardiomyopathy) ([place])   . Orthostatic hypotension   . Osteoarthritis   . PAF (paroxysmal atrial fibrillation) ([place])    a. diagnosed 07/2016 s/p DCCV 08/01/16 with recurrence, managed with rate control for now.   . Pneumonia 2013  . PVC's (premature ventricular contractions)   . Shingles since Nov 18, 2013   on right arm from shoulder to wrist  . Status post dilation of esophageal narrowing   . Stroke [facility]) 2001   STROKES, TIA'S  . Syncope    a. 08/2016 felt due to orthostatic hypotension.  Marland Kitchen TIA (transient ischemic attack) last 2001   anticoagulation therapy on coumadin   Past Surgical History:  Procedure Laterality Date  . ANTERIOR AND POSTERIOR VAGINAL REPAIR  2008   A&P REPAIR-DR MCDIARMID  . ANTERIOR LATERAL LUMBAR FUSION 4 LEVELS Right 07/03/2015   Procedure: Right Lumbar one-two, Lumbar  two-three, Lumbar three-four, Lumbar four-five Anterior lateral lumbar interbody fusion;  Surgeon: Erline Levine, MD;  Location: [place] NEURO ORS;  Service: Neurosurgery;  Laterality: Right;  Right L1-2 L2-3 L3-4 L4-5 Anterior lateral lumbar interbody fusion  . BALLOON DILATION N/A 03/03/2014   Procedure: BALLOON DILATION;  Surgeon: Inda Castle, MD;  Location: [facility] ENDOSCOPY;  Service: Endoscopy;  Laterality: N/A;  . BLADDER SUSPENSION    . CARDIAC CATHETERIZATION N/A 08/22/2016    Procedure: Left Heart Cath and Coronary Angiography;  Surgeon: Peter M Martinique, MD;  Location: [place] CV LAB;  Service: Cardiovascular;  Laterality: N/A;  . CARDIOVERSION N/A 08/01/2016   Procedure: CARDIOVERSION;  Surgeon: Jerline Pain, MD;  Location: [place];  Service: Cardiovascular;  Laterality: N/A;  . CARDIOVERSION N/A 10/11/2016   Procedure: CARDIOVERSION;  Surgeon: Sueanne Margarita, MD;  Location: [facility] ENDOS[facility];  Service: Cardiovascular;  Laterality: N/A;  . COLONOSCOPY    . ESOPHAGOGASTRODUODENOSCOPY N/A 03/03/2014   Procedure: ESOPHAGOGASTRODUODENOSCOPY (EGD);  Surgeon: Inda Castle, MD;  Location: [facility];  Service: Endoscopy;  Laterality: N/A;  . EXCISION OF BASAL CELL CA  2011  . GLUTEUS MINIMUS REPAIR Right 03/15/2016   Procedure: RIGHT HIP GLUTEUS MEDIUS TENDON REPAIR;  Surgeon: Paralee Cancel, MD;  Location: [facility]S;  Service: Orthopedics;  Laterality: Right;  . ileostomy reversal    . LUMBAR PERCUTANEOUS PEDICLE SCREW 4 LEVEL Right 07/03/2015   Procedure: Lumbar one-Sacral one Bilateral percutaneous pedicle screws;  Surgeon: Erline Levine, MD;  Location: [place] NEUR[facility] ORS;  Service: Neurosurgery;  Laterality: Right;  L1-S1 Bilateral percutaneous pedicle screws  . RECTOCELE REPAIR  2008   A&P REPAIR -DR. MCDIARMID  . THUMB SURGERY Right 10-15 yrs ago  . UPPER GASTROINTESTINAL ENDOSCOPY    . VAGINAL HYSTERECTOMY  1983   NO BSO-DR. MABRY  . VARICOSE VEIN SURGERY       Current Outpatient Prescriptions  Medication Sig Dispense Refill  . albuterol (PROVENTIL HFA;VENTOLIN HFA) 108 (90 BASE) MCG/ACT inhaler Inhale 1-2 puffs into the lungs every 6 (six) hours as needed for wheezing or shortness of breath. 3 Inhaler 4  . amiodarone (PACERONE) 200 MG tablet Take 1 tablet (200 mg total) by mouth daily. 90 tablet 3  . apixaban (ELIQUIS) 5 MG TABS tablet Take 1 tablet (5 mg total) by mouth 2 (two) times daily. 180 tablet 1  . azelastine (ASTELIN) 0.1 % nasal spray Place 2 sprays into  the nose daily. Use in each nostril as directed (Patient taking differently: Place 2 sprays into both nostrils daily. ) 90 mL 1  . benzonatate (TESSALON) 100 MG capsule Take 1 capsule (100 mg total) by mouth 3 (three) times daily as needed for cough. 90 capsule 0  . budesonide-formoterol (SYMBICORT) 160-4.5 MCG/ACT inhaler Inhale 2 puffs into the lungs 2 (two) times daily. 3 Inhaler 4  . buPROPion (WELLBUTRIN XL) 150 MG 24 hr tablet Take 1 tablet (150 mg total) by mouth daily. 30 tablet 11  . Calcium Carbonate-Vitamin D (CALCIUM + D PO) Take 1 tablet by mouth 2 (two) times daily.     . cholestyramine (QUESTRAN) 4 g packet Take 1 packet (4 g total) by mouth 2 (two) times daily. 60 each 3  . cyclobenzaprine (FLEXERIL) 5 MG tablet Take 1 tablet (5 mg total) by mouth 3 (three) times daily as needed for muscle spasms. 270 tablet 0  . fluticasone (FLONASE) 50 MCG/ACT nasal spray Place 1 spray into both nostrils 2 (two) times daily. 48 g 1  .  furosemide (LASIX) 20 MG tablet Take 1 tablet (20 mg total) by mouth as needed (swelling). 90 tablet 0  . guaiFENesin (MUCINEX) 600 MG 12 hr tablet Take 1,200 mg by mouth 2 (two) times daily as needed for cough or to loosen phlegm.    . Lidocaine HCl 2 % CREA Apply 1 application topically daily as needed (for shingles flares).    . LORazepam (ATIVAN) 0.5 MG tablet Take 1 tablet (0.5 mg total) by mouth at bedtime. 30 tablet 5  . Melatonin 5 MG TABS Take 5 mg by mouth at bedtime.    . metoprolol succinate (TOPROL-XL) 100 MG 24 hr tablet Take 1 tablet (100 mg total) by mouth 2 (two) times daily. Take with or immediately following a meal. 180 tablet 3  . mirabegron ER (MYRBETRIQ) 50 MG TB24 tablet Take 50 mg by mouth daily.    . montelukast (SINGULAIR) 10 MG tablet Take 10 mg by mouth daily.    . Multiple Vitamin (MULTIVITAMIN) tablet Take 1 tablet by mouth daily.      Marland Kitchen nystatin (MYCOSTATIN) 100000 UNIT/ML suspension Take 5 cc by mouth, swish and swallow 4 times daily.  280 mL 0  . omeprazole (PRILOSEC) 40 MG capsule Take 1 capsule (40 mg total) by mouth daily. 90 capsule 3  . Probiotic Product (PROBIOTIC DAILY PO) Take by mouth.    . promethazine (PHENERGAN) 12.5 MG tablet Take 1 tablet (12.5 mg total) by mouth every 6 (six) hours as needed for nausea or vomiting. 30 tablet 0  . Respiratory Therapy Supplies (FLUTTER) DEVI Use 2-3 times per day 1 each 0  . Spacer/Aero-Holding Chambers (AEROCHAMBER Z-STAT PLUS) inhaler Use as instructed 1 each 0  . Tiotropium Bromide Monohydrate (SPIRIVA RESPIMAT) 2.5 MCG/ACT AERS Inhale 2 puffs into the lungs daily. 3 Inhaler 3  . ezetimibe (ZETIA) 10 MG tablet Take 1 tablet (10 mg total) by mouth daily. 90 tablet 3   Current Facility-Administered Medications  Medication Dose Route Frequency Provider Last Rate Last Dose  . 0.9 %  sodium chloride infusion  500 mL Intravenous Continuous Irene Shipper, MD        Allergies:   Clarithromycin; Lipitor [atorvastatin]; Simvastatin; Cardizem [diltiazem hcl]; Contrast media [iodinated diagnostic agents]; and Crestor [rosuvastatin calcium]   Social History:  The patient  reports that she quit smoking about 21 years ago. Her smoking use included Cigarettes. She started smoking about 62 years ago. She has a 126.00 pack-year smoking history. She has never used smokeless tobacco. She reports that she does not drink alcohol or use drugs.   Family History:  The patient's family history includes Breast cancer in her maternal aunt and mother; COPD in her mother; Emphysema in her mother; Uterine cancer in her mother.    ROS:  Please see the history of present illness.   Otherwise, review of systems is positive for none.   All other systems are reviewed and negative.    PHYSICAL EXAM: VS:  BP 124/70   Pulse 73   Ht 5\' 4"  (1.626 m)   Wt 125 lb 6.4 oz (56.9 kg)   BMI 21.52 kg/m  , BMI Body mass index is 21.52 kg/m. GEN: Well nourished, well developed, in no acute distress  HEENT: normal    Neck: no JVD, carotid bruits, or masses Cardiac: iRRR; no murmurs, rubs, or gallops,no edema  Respiratory:  clear to auscultation bilaterally, normal work of breathing GI: soft, nontender, nondistended, + BS MS: no deformity or atrophy  Skin:  warm and dry Neuro:  Strength and sensation are intact Psych: euthymic mood, full affect  EKG:  EKG is ordered today. Personal review of the ekg ordered shows sinus rhythm, rate 73, 1 degree AV block, PVCs, possible septal infarct  Recent Labs: 08/17/2016: B Natriuretic Peptide 343.5 09/13/2016: ALT 18; Pro B Natriuretic peptide (BNP) 202.0 09/16/2016: Magnesium 1.7; Platelets 346; TSH 2.72 10/11/2016: BUN 22; Creatinine, Ser 1.30; Hemoglobin 12.2; Potassium 4.4; Sodium 139    Lipid Panel     Component Value Date/Time   CHOL 239 (H) 11/12/2015 1142   TRIG 122.0 11/12/2015 1142   HDL 49.70 11/12/2015 1142   CHOLHDL 5 11/12/2015 1142   VLDL 24.4 11/12/2015 1142   LDLCALC 165 (H) 11/12/2015 1142   LDLDIRECT 241.1 12/02/2014 1528     Wt Readings from Last 3 Encounters:  01/03/17 125 lb 6.4 oz (56.9 kg)  12/20/16 126 lb (57.2 kg)  12/14/16 125 lb (56.7 kg)      Other studies Reviewed: Additional studies/ records that were reviewed today include: TTE 06/24/16, Cath 08/22/16, Telemetry 06/14/16  Review of the above records today demonstrates:  TTE - Left ventricle: The cavity size was at the upper limits of   normal. Wall thickness was normal. Systolic function was mildly   reduced. The estimated ejection fraction was in the range of 45%   to 50%. Diffuse hypokinesis. Features are consistent with a   pseudonormal left ventricular filling pattern, with concomitant   abnormal relaxation and increased filling pressure (grade 2   diastolic dysfunction). - Ventricular septum: Septal motion showed paradox. These changes   are consistent with intraventricular conduction delay. - Mitral valve: There was moderate regurgitation directed    centrally. - Left atrium: The atrium was moderately to severely dilated. - Atrial septum: The septum bowed from left to right, consistent   with increased left atrial pressure. No defect or patent foramen   ovale was identified.  Cath  Prox LAD to Mid LAD lesion, 15 %stenosed.  Ost Cx to Prox Cx lesion, 15 %stenosed.  Prox RCA to Mid RCA lesion, 20 %stenosed.  There is mild left ventricular systolic dysfunction.  LV end diastolic pressure is normal.  The left ventricular ejection fraction is 45-50% by visual estimate.   1. Minor nonobstructive CAD 2. Mild LV dysfunction with distal inferior HK 3. Measurement of LV pressures limited by frequent PVCs but EDP appears normal.   Telemetry 1. NSR with PVC's, at times in a bigeminal manner 2. Signal drop out as well as noise artifact are present.  3. No sustained atrial or ventricular arrhythmias 4. No pauses   ASSESSMENT AND PLAN:  1.  Paroxysmal atrial fibrillation: on Eliquis with Toprol XL for rate control and amiodarone.  Had cardioversion 10/11/16. In sinus rhythm today. Continue current management.  This patients CHA2DS2-VASc Score and unadjusted Ischemic Stroke Rate (% per year) is equal to 7.2 % stroke rate/year from a score of 5  Above score calculated as 1 point each if present [CHF, HTN, DM, Vascular=MI/PAD/Aortic Plaque, Age if 65-74, or Female] Above score calculated as 2 points each if present [Age > 75, or Stroke/TIA/TE]  2. Chronic combined systolic HF: Currently well compensated on Toprol-XL. No ACE inhibitor due to low blood pressures.  3. HTN: Currently well controlled  4. PVCs: She is on amiodarone currently. She is continuing to have episodes of PVCs, and therefore we Jadelin Eng order her a 48 hour monitor. It is possible that the days that she feels  bad she is having multiple PVCs that is decreasing her pulse   Current medicines are reviewed at length with the patient today.   The patient does not have  concerns regarding her medicines.  The following changes were made today:  amiodarone  Labs/ tests ordered today include:  Orders Placed This Encounter  Procedures  . Holter monitor - 48 hour  . EKG 12-Lead     Disposition:   FU with Michaeljohn Biss 3 months  Signed, Prakash Kimberling Meredith Leeds, MD  01/03/2017 11:28 AM     [facility] [address] [place] [address] [phone] (office) [phone] (fax)

## 2017-01-03 NOTE — Addendum Note (Signed)
Addended by: Stanton Kidney on: 01/03/2017 11:33 AM   Modules accepted: Orders

## 2017-01-03 NOTE — Patient Instructions (Addendum)
Medication Instructions:    Your physician recommends that you continue on your current medications as directed. Please refer to the Current Medication list given to you today.  --- If you need a refill on your cardiac medications before your next appointment, please call your pharmacy. ---  Labwork:  None ordered  Testing/Procedures: Your physician has recommended that you wear a 48 hour holter monitor. Holter monitors are medical devices that record the heart's electrical activity. Doctors most often use these monitors to diagnose arrhythmias. Arrhythmias are problems with the speed or rhythm of the heartbeat. The monitor is a small, portable device. You can wear one while you do your normal daily activities. This is usually used to diagnose what is causing palpitations/syncope (passing out).  Follow-Up:  Your physician recommends that you schedule a follow-up appointment in: 3 months with Dr. Curt Bears  Thank you for choosing [facility]!!   [name], RN [phone]  Any Other Special Instructions Will Be Listed Below (If Applicable).   Holter Monitoring Introduction A Holter monitor is a small device that is used to detect abnormal heart rhythms. It clips to your clothing and is connected by wires to flat, sticky disks (electrodes) that attach to your chest. It is worn continuously for 24-48 hours. Follow these instructions at home:  Wear your Holter monitor at all times, even while exercising and sleeping, for as long as directed by your health care provider.  Make sure that the Holter monitor is safely clipped to your clothing or close to your body as recommended by your health care provider.  Do not get the monitor or wires wet.  Do not put body lotion or moisturizer on your chest.  Keep your skin clean.  Keep a diary of your daily activities, such as walking and doing chores. If you feel that your heartbeat is abnormal or that your heart is fluttering or  skipping a beat:  Record what you are doing when it happens.  Record what time of day the symptoms occur.  Return your Holter monitor as directed by your health care provider.  Keep all follow-up visits as directed by your health care provider. This is important. Get help right away if:  You feel lightheaded or you faint.  You have trouble breathing.  You feel pain in your chest, upper arm, or jaw.  You feel sick to your stomach and your skin is pale, cool, or damp.  You heartbeat feels unusual or abnormal. This information is not intended to replace advice given to you by your health care provider. Make sure you discuss any questions you have with your health care provider. Document Released: 08/19/2004 Document Revised: 04/28/2016 Document Reviewed: 06/30/2014  2017 Elsevier

## 2017-01-04 ENCOUNTER — Ambulatory Visit (INDEPENDENT_AMBULATORY_CARE_PROVIDER_SITE_OTHER): Payer: Medicare Other

## 2017-01-04 DIAGNOSIS — I493 Ventricular premature depolarization: Secondary | ICD-10-CM | POA: Diagnosis not present

## 2017-01-04 NOTE — Progress Notes (Signed)
Faxed rx to [facility] at - fax: [phone].  Phone [phone]

## 2017-01-05 DIAGNOSIS — I509 Heart failure, unspecified: Secondary | ICD-10-CM | POA: Diagnosis not present

## 2017-01-05 DIAGNOSIS — J449 Chronic obstructive pulmonary disease, unspecified: Secondary | ICD-10-CM | POA: Diagnosis not present

## 2017-01-05 DIAGNOSIS — I4891 Unspecified atrial fibrillation: Secondary | ICD-10-CM | POA: Diagnosis not present

## 2017-01-05 DIAGNOSIS — I110 Hypertensive heart disease with heart failure: Secondary | ICD-10-CM | POA: Diagnosis not present

## 2017-01-05 DIAGNOSIS — R269 Unspecified abnormalities of gait and mobility: Secondary | ICD-10-CM | POA: Diagnosis not present

## 2017-01-05 DIAGNOSIS — Z9181 History of falling: Secondary | ICD-10-CM | POA: Diagnosis not present

## 2017-01-06 DIAGNOSIS — R269 Unspecified abnormalities of gait and mobility: Secondary | ICD-10-CM | POA: Diagnosis not present

## 2017-01-06 DIAGNOSIS — I509 Heart failure, unspecified: Secondary | ICD-10-CM | POA: Diagnosis not present

## 2017-01-06 DIAGNOSIS — I110 Hypertensive heart disease with heart failure: Secondary | ICD-10-CM | POA: Diagnosis not present

## 2017-01-06 DIAGNOSIS — J449 Chronic obstructive pulmonary disease, unspecified: Secondary | ICD-10-CM | POA: Diagnosis not present

## 2017-01-06 DIAGNOSIS — I4891 Unspecified atrial fibrillation: Secondary | ICD-10-CM | POA: Diagnosis not present

## 2017-01-06 DIAGNOSIS — Z9181 History of falling: Secondary | ICD-10-CM | POA: Diagnosis not present

## 2017-01-09 DIAGNOSIS — R269 Unspecified abnormalities of gait and mobility: Secondary | ICD-10-CM | POA: Diagnosis not present

## 2017-01-09 DIAGNOSIS — J449 Chronic obstructive pulmonary disease, unspecified: Secondary | ICD-10-CM | POA: Diagnosis not present

## 2017-01-09 DIAGNOSIS — I110 Hypertensive heart disease with heart failure: Secondary | ICD-10-CM | POA: Diagnosis not present

## 2017-01-09 DIAGNOSIS — I509 Heart failure, unspecified: Secondary | ICD-10-CM | POA: Diagnosis not present

## 2017-01-09 DIAGNOSIS — Z9181 History of falling: Secondary | ICD-10-CM | POA: Diagnosis not present

## 2017-01-09 DIAGNOSIS — I4891 Unspecified atrial fibrillation: Secondary | ICD-10-CM | POA: Diagnosis not present

## 2017-01-10 ENCOUNTER — Encounter: Payer: Self-pay | Admitting: Internal Medicine

## 2017-01-11 DIAGNOSIS — R269 Unspecified abnormalities of gait and mobility: Secondary | ICD-10-CM | POA: Diagnosis not present

## 2017-01-11 DIAGNOSIS — Z9181 History of falling: Secondary | ICD-10-CM | POA: Diagnosis not present

## 2017-01-11 DIAGNOSIS — I509 Heart failure, unspecified: Secondary | ICD-10-CM | POA: Diagnosis not present

## 2017-01-11 DIAGNOSIS — I4891 Unspecified atrial fibrillation: Secondary | ICD-10-CM | POA: Diagnosis not present

## 2017-01-11 DIAGNOSIS — J449 Chronic obstructive pulmonary disease, unspecified: Secondary | ICD-10-CM | POA: Diagnosis not present

## 2017-01-11 DIAGNOSIS — I110 Hypertensive heart disease with heart failure: Secondary | ICD-10-CM | POA: Diagnosis not present

## 2017-01-12 ENCOUNTER — Telehealth: Payer: Self-pay | Admitting: Internal Medicine

## 2017-01-12 NOTE — Telephone Encounter (Signed)
Received vm from Christa, PT from [facility], just to inform Dr. Jenny Reichmann that [name] is done with inpatient PT and she is ready for out patient PT. FYI

## 2017-01-17 ENCOUNTER — Telehealth: Payer: Self-pay | Admitting: Cardiology

## 2017-01-17 NOTE — Telephone Encounter (Signed)
New Message;   Pt says she is having  real low pulse readings. She says the only thing she have to count it with is her blood pressure cuff,the instrument that she checks her oxygen with.She does not know how to check it just counting the beats and looking at her watch.Her blood pressure this morning was 154/60 and her pulse rate was 34.

## 2017-01-17 NOTE — Telephone Encounter (Signed)
Returned patient's call.  Stated that she was having trouble getting her pulse rate.  She was trying to use an exercise watch or pulse ox, she kept getting low pulse rates in the low to mid 30's..  She said she didn't know how to do a manual pulse count.  I explained how to do this.  She then performed a manual check and her pulse rate was 58.  She wasn't feeling tired or fatigued.  No SOB, no CP, no dizziness.  She expressed understanding and appreciation.

## 2017-01-18 NOTE — Telephone Encounter (Signed)
Informed pt to keep monitoring.  Discussed that if she was having PAF and/or PVCs then the watch/pulse ox will not pick up a correct HR. She will continue to monitor pulse rate manually.  She will call if she begins to experience symptoms w/ HR in the 50s.   She thanks me for following up.

## 2017-01-19 DIAGNOSIS — L820 Inflamed seborrheic keratosis: Secondary | ICD-10-CM | POA: Diagnosis not present

## 2017-01-19 DIAGNOSIS — L209 Atopic dermatitis, unspecified: Secondary | ICD-10-CM | POA: Diagnosis not present

## 2017-01-23 ENCOUNTER — Ambulatory Visit (INDEPENDENT_AMBULATORY_CARE_PROVIDER_SITE_OTHER)
Admission: RE | Admit: 2017-01-23 | Discharge: 2017-01-23 | Disposition: A | Discharge status: Home / Self Care | Payer: Medicare Other | Source: Ambulatory Visit | Attending: Acute Care | Admitting: Acute Care

## 2017-01-23 DIAGNOSIS — R938 Abnormal findings on diagnostic imaging of other specified body structures: Secondary | ICD-10-CM | POA: Diagnosis not present

## 2017-01-23 DIAGNOSIS — J439 Emphysema, unspecified: Secondary | ICD-10-CM | POA: Diagnosis not present

## 2017-01-23 DIAGNOSIS — R9389 Abnormal findings on diagnostic imaging of other specified body structures: Secondary | ICD-10-CM

## 2017-01-24 ENCOUNTER — Encounter: Payer: Self-pay | Admitting: Internal Medicine

## 2017-01-24 ENCOUNTER — Other Ambulatory Visit: Payer: Self-pay | Admitting: Internal Medicine

## 2017-01-24 DIAGNOSIS — Z1231 Encounter for screening mammogram for malignant neoplasm of breast: Secondary | ICD-10-CM

## 2017-01-25 ENCOUNTER — Ambulatory Visit: Payer: Medicare Other | Admitting: Pulmonary Disease

## 2017-01-25 MED ORDER — BUPROPION HCL ER (XL) 300 MG PO TB24: 300.0000 mg | ORAL_TABLET | Freq: Every day | ORAL | 3 refills | Status: DC

## 2017-01-25 NOTE — Telephone Encounter (Signed)
Done erx - increased wellbutrin

## 2017-02-06 ENCOUNTER — Ambulatory Visit (INDEPENDENT_AMBULATORY_CARE_PROVIDER_SITE_OTHER): Payer: Medicare Other | Admitting: Pulmonary Disease

## 2017-02-06 ENCOUNTER — Encounter: Payer: Self-pay | Admitting: Pulmonary Disease

## 2017-02-06 ENCOUNTER — Inpatient Hospital Stay: Admission: RE | Admit: 2017-02-06 | Payer: Medicare Other | Source: Ambulatory Visit

## 2017-02-06 VITALS — BP 122/64 | HR 68 | Ht 64.0 in | Wt 129.2 lb

## 2017-02-06 DIAGNOSIS — J309 Allergic rhinitis, unspecified: Secondary | ICD-10-CM | POA: Diagnosis not present

## 2017-02-06 DIAGNOSIS — K219 Gastro-esophageal reflux disease without esophagitis: Secondary | ICD-10-CM

## 2017-02-06 DIAGNOSIS — J449 Chronic obstructive pulmonary disease, unspecified: Secondary | ICD-10-CM

## 2017-02-06 NOTE — Progress Notes (Signed)
Subjective:    Patient ID: [name], female    DOB: 1941/02/24, 76 y.o.   MRN: [id number]  C.C.: Follow-up for Moderate COPD, Right Middle Lobe Nodule, Chronic Allergic Rhinitis, & GERD w/ Hiatal Hernia.  HPI  Moderate COPD: Currently prescribed Symbicort and Spiriva. She reports no exacerbations since last appointment. Denies any coughing or wheezing. She reports she is using her rescue medication at most 2 times daily. Still having consistent dyspnea on exertion.   Right middle lobe nodule: Measured 4 mm on CT angiogram in September 2017. This was not seen on CT imaging performed in February.  Chronic allergic rhinitis: Currently prescribed Singulair, Astelin nasal spray, and Flonase nasal spray. No sinus congestion, pressure, or pain. She has started back using her nasal saline rinse.   GERD with hiatal hernia: Currently following with GI. Currently prescribed Prilosec. No abdominal pain or nausea. No morning brash water taste.   Review of Systems  She reports no recent chest pain. No chest pressure or tightness. Denies any fever or chills.   Allergies  Allergen Reactions  . Clarithromycin Other (See Comments)    REACTION: possible rash but could have been legionarres dz with rash  . Lipitor [Atorvastatin] Other (See Comments)    MUSCLE SPASMS  . Simvastatin Other (See Comments)    Muscle and joint pain  . Cardizem [Diltiazem Hcl] Hives  . Contrast Media [Iodinated Diagnostic Agents]     FYI - during admission at [facility] 08/2016 patient developed hives, question related to cardizem or contrast dye - not clear  . Crestor [Rosuvastatin Calcium]     fagitue and joint pain, "NO STATINS"    Current Outpatient Prescriptions on File Prior to Visit  Medication Sig Dispense Refill  . albuterol (PROVENTIL HFA;VENTOLIN HFA) 108 (90 BASE) MCG/ACT inhaler Inhale 1-2 puffs into the lungs every 6 (six) hours as needed for wheezing or shortness of breath. 3 Inhaler 4  .  amiodarone (PACERONE) 200 MG tablet Take 1 tablet (200 mg total) by mouth daily. 90 tablet 3  . apixaban (ELIQUIS) 5 MG TABS tablet Take 1 tablet (5 mg total) by mouth 2 (two) times daily. 180 tablet 1  . azelastine (ASTELIN) 0.1 % nasal spray Place 2 sprays into the nose daily. Use in each nostril as directed (Patient taking differently: Place 2 sprays into both nostrils daily. ) 90 mL 1  . benzonatate (TESSALON) 100 MG capsule Take 1 capsule (100 mg total) by mouth 3 (three) times daily as needed for cough. 90 capsule 0  . budesonide-formoterol (SYMBICORT) 160-4.5 MCG/ACT inhaler Inhale 2 puffs into the lungs 2 (two) times daily. 3 Inhaler 4  . buPROPion (WELLBUTRIN XL) 300 MG 24 hr tablet Take 1 tablet (300 mg total) by mouth daily. 90 tablet 3  . Calcium Carbonate-Vitamin D (CALCIUM + D PO) Take 1 tablet by mouth 2 (two) times daily.     . cholestyramine (QUESTRAN) 4 g packet Take 1 packet (4 g total) by mouth 2 (two) times daily. 60 each 3  . cyclobenzaprine (FLEXERIL) 5 MG tablet Take 1 tablet (5 mg total) by mouth 3 (three) times daily as needed for muscle spasms. 270 tablet 0  . fluticasone (FLONASE) 50 MCG/ACT nasal spray Place 1 spray into both nostrils 2 (two) times daily. 48 g 1  . furosemide (LASIX) 20 MG tablet Take 1 tablet (20 mg total) by mouth as needed (swelling). 90 tablet 0  . guaiFENesin (MUCINEX) 600 MG 12 hr tablet Take  1,200 mg by mouth 2 (two) times daily as needed for cough or to loosen phlegm.    . Lidocaine HCl 2 % CREA Apply 1 application topically daily as needed (for shingles flares).    . LORazepam (ATIVAN) 0.5 MG tablet Take 1 tablet (0.5 mg total) by mouth at bedtime. 30 tablet 5  . Melatonin 5 MG TABS Take 5 mg by mouth at bedtime.    . metoprolol succinate (TOPROL-XL) 100 MG 24 hr tablet Take 1 tablet (100 mg total) by mouth 2 (two) times daily. Take with or immediately following a meal. 180 tablet 3  . mirabegron ER (MYRBETRIQ) 50 MG TB24 tablet Take 50 mg by  mouth daily.    . montelukast (SINGULAIR) 10 MG tablet Take 10 mg by mouth daily.    . Multiple Vitamin (MULTIVITAMIN) tablet Take 1 tablet by mouth daily.      Marland Kitchen nystatin (MYCOSTATIN) 100000 UNIT/ML suspension Take 5 cc by mouth, swish and swallow 4 times daily. 280 mL 0  . omeprazole (PRILOSEC) 40 MG capsule Take 1 capsule (40 mg total) by mouth daily. 90 capsule 3  . Probiotic Product (PROBIOTIC DAILY PO) Take by mouth.    . Respiratory Therapy Supplies (FLUTTER) DEVI Use 2-3 times per day 1 each 0  . Spacer/Aero-Holding Chambers (AEROCHAMBER Z-STAT PLUS) inhaler Use as instructed 1 each 0  . Tiotropium Bromide Monohydrate (SPIRIVA RESPIMAT) 2.5 MCG/ACT AERS Inhale 2 puffs into the lungs daily. 3 Inhaler 3  . ezetimibe (ZETIA) 10 MG tablet Take 1 tablet (10 mg total) by mouth daily. 90 tablet 3  . [DISCONTINUED] valsartan (DIOVAN) 160 MG tablet Take 1 tablet (160 mg total) by mouth daily. 90 tablet 3   Current Facility-Administered Medications on File Prior to Visit  Medication Dose Route Frequency Provider Last Rate Last Dose  . 0.9 %  sodium chloride infusion  500 mL Intravenous Continuous Irene Shipper, MD        Past Medical History:  Diagnosis Date  . Allergic rhinitis   . Anxiety   . Atrial septal aneurysm   . Basal cell carcinoma   . Bradycardia   . Cataract    bil cataracts removed  . Chronic combined systolic and diastolic CHF (congestive heart failure) ([place])   . CKD (chronic kidney disease), stage III   . Clostridium difficile infection   . COPD (chronic obstructive pulmonary disease) ([place])   . Depression 11/29/2013  . DI (detrusor instability)   . Esophageal stricture   . GERD (gastroesophageal reflux disease)   . Hemorrhoids   . Hiatal hernia   . History of kidney stones   . HLD (hyperlipidemia)   . HTN (hypertension)   . Ischemic colon ([place])    a. 08/2016 - [facility] with a ruptured colon s/p right hemicolectomy with ileostomy formation for  ischemic and necrotic colon.   . Lung nodule    a. followed by pulmonology  . Mild CAD    a. minor nonobstructive CAD 08/22/16.  . Nephrolithiasis    hx  . NICM (nonischemic cardiomyopathy) ([place])   . Orthostatic hypotension   . Osteoarthritis   . PAF (paroxysmal atrial fibrillation) ([place])    a. diagnosed 07/2016 s/p DCCV 08/01/16 with recurrence, managed with rate control for now.   . Pneumonia 2013  . PVC's (premature ventricular contractions)   . Shingles since Nov 18, 2013   on right arm from shoulder to wrist  . Status post dilation of esophageal  narrowing   . Stroke [facility]) 2001   STROKES, TIA'S  . Syncope    a. 08/2016 felt due to orthostatic hypotension.  Marland Kitchen TIA (transient ischemic attack) last 2001   anticoagulation therapy on coumadin    Past Surgical History:  Procedure Laterality Date  . ANTERIOR AND POSTERIOR VAGINAL REPAIR  2008   A&P REPAIR-DR MCDIARMID  . ANTERIOR LATERAL LUMBAR FUSION 4 LEVELS Right 07/03/2015   Procedure: Right Lumbar one-two, Lumbar two-three, Lumbar three-four, Lumbar four-five Anterior lateral lumbar interbody fusion;  Surgeon: Erline Levine, MD;  Location: [facility] NEURO ORS;  Service: Neurosurgery;  Laterality: Right;  Right L1-2 L2-3 L3-4 L4-5 Anterior lateral lumbar interbody fusion  . BALLOON DILATION N/A 03/03/2014   Procedure: BALLOON DILATION;  Surgeon: Inda Castle, MD;  Location: [facility] ENDOSCOPY;  Service: Endoscopy;  Laterality: N/A;  . BLADDER SUSPENSION    . CARDIAC CATHETERIZATION N/A 08/22/2016   Procedure: Left Heart Cath and Coronary Angiography;  Surgeon: Peter M Martinique, MD;  Location: [facility] CV LAB;  Service: Cardiovascular;  Laterality: N/A;  . CARDIOVERSION N/A 08/01/2016   Procedure: CARDIOVERSION;  Surgeon: Jerline Pain, MD;  Location: [facility];  Service: Cardiovascular;  Laterality: N/A;  . CARDIOVERSION N/A 10/11/2016   Procedure: CARDIOVERSION;  Surgeon: Sueanne Margarita, MD;  Location: [facility] ENDOSCOPY;  Service: Cardiovascular;   Laterality: N/A;  . COLONOSCOPY    . ESOPHAGOGASTRODUODENOSCOPY N/A 03/03/2014   Procedure: ESOPHAGOGASTRODUODENOSCOPY (EGD);  Surgeon: Inda Castle, MD;  Location: [facility];  Service: Endoscopy;  Laterality: N/A;  . EXCISION OF BASAL CELL CA  2011  . GLUTEUS MINIMUS REPAIR Right 03/15/2016   Procedure: RIGHT HIP GLUTEUS MEDIUS TENDON REPAIR;  Surgeon: Paralee Cancel, MD;  Location: [facility] ORS;  Service: Orthopedics;  Laterality: Right;  . ileostomy reversal    . LUMBAR PERCUTANEOUS PEDICLE SCREW 4 LEVEL Right 07/03/2015   Procedure: Lumbar one-Sacral one Bilateral percutaneous pedicle screws;  Surgeon: Erline Levine, MD;  Location: [facility] NEURO ORS;  Service: Neurosurgery;  Laterality: Right;  L1-S1 Bilateral percutaneous pedicle screws  . RECTOCELE REPAIR  2008   A&P REPAIR -DR. MCDIARMID  . THUMB SURGERY Right 10-15 yrs ago  . UPPER GASTROINTESTINAL ENDOSCOPY    . VAGINAL HYSTERECTOMY  1983   NO BSO-DR. MABRY  . VARICOSE VEIN SURGERY      Family History  Problem Relation Age of Onset  . COPD Mother   . Breast cancer Mother     mid 48's  . Uterine cancer Mother   . Emphysema Mother   . Breast cancer Maternal Aunt     x 3 aunts, post menopausal  . Colon cancer Neg Hx   . Esophageal cancer Neg Hx   . Rectal cancer Neg Hx   . Stomach cancer Neg Hx   . Pancreatic cancer Neg Hx     Social History   Social History  . Marital status: Married    Spouse name: Shanon Brow  . Number of children: 2  . Years of education: N/A   Occupational History  . retired Retired   Social History Main Topics  . Smoking status: Former Smoker    Packs/day: 3.00    Years: 42.00    Types: Cigarettes    Start date: 08/04/1954    Quit date: 12/06/1995  . Smokeless tobacco: Never Used  . Alcohol use No     Comment: occas very little  . Drug use: No  . Sexual activity: Not Currently    Birth control/ protection:  Surgical   Other Topics Concern  . None   Social History Narrative   Retired- Mining engineer Pulmonary:   Originally from [place]. Has always lived in [place]. She has traveled to [place], [place], [place], [place], [place], [place]. Has been on multiple cruises. 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 She did use a face mask consistently to avoid chemical fume exposure. She may have only had an inhaled exposure a couple of times. She currently has a dog. Remote exposure to a parakeet. No mold or hot tub exposure. No asbestos exposure.       Objective:   Physical Exam BP 122/64 (BP Location: Right Arm, Patient Position: Sitting, Cuff Size: Normal)   Pulse 68   Ht 5\' 4"  (1.626 m)   Wt 129 lb 3.2 oz (58.6 kg)   SpO2 96%   BMI 22.18 kg/m   Gen.: No distress. Accompanied by husband today. Awake. Abdomen: No rash or bruising on exposed skin. Warm and dry. HEENT: No scleral icterus. Moist mucous membranes. No nasal turbinate swelling. Cardiovascular: Regular rate and rhythm. No edema. No appreciable JVD. Pulmonary: Very faint end expiratory wheeze in the bases. Otherwise good aeration bilaterally. Normal work of breathing on room air. Abdomen: Soft. Protuberant. Nontender.  PFT 10/10/16: FVC 1.94 L (70%) FEV1 0.93 L (45%) FEV1/FVC 0.48 FEF 25-75 0.35 L (22%) positive bronchodilator response 03/01/16: FVC 2.55 L (91%) FEV1 1.30 L (62%) FEV1/FVC 0.51 FEF 25-75 0.56 L (34%) negative bronchodilator response TLC 5.66 L (111%) RV 116% ERV 133% DLCO uncorrected 62% (hemoglobin 15.1) 12/28/11:FVC 2.89 L (104%) FEV1 1.22 L (62%) FEV1/FVC 0.42 FEF 25-75 0.33 L (15%) negative bronchodilator response TLC 6.19 L (129%) RV 163% ERV 114% DLCO uncorrected 69%  6MWT 10/10/16:  Walked 96 meters / Baseline Sat 93% on RA / Nadir Sat 93% on RA @ rest (couldn't finish due to weakness & dyspnea  W/ 2:57 left) 08/24/16:  Walked 1 lap & was 96% at lowest on room air  IMAGING CT CHEST W/O 01/23/17  (personally reviewed by me): Resolution of groundglass opacity. 7 mm right lower lobe nodule unchanged. Apical predominant emphysematous changes. Right middle lobe nodule was not appreciated on this image. No pleural effusion or thickening. No pathologic mediastinal adenopathy. No pericardial effusion.  CTA CHEST 08/17/16 (previously reviewed by me):  No evidence of PE. No pleural effusion or thickening. No pathologic mediastinal adenopathy. Groundglass noted within right lung predominantly in the upper lung zone. Moderate upper lobe predominant emphysema which has progressed since 2015. 7 mm right lower lobe nodule has not changed since 2015. Patient has a 4 mm nodule within right middle lobe which appears new.  CXR PA/LAT 05/25/16 (previously reviewed by me): Hyperinflation with flattening of the diaphragms. No parenchymal opacity or mass appreciated. No pleural effusion. Heart normal in size & mediastinum normal in contour.  CXR PA/LAT 03/22/16 (previously reviewed by me):  Hyperinflation with elevation of left hemidiaphragm. No new focal opacity. Heart normal in size & mediastinum normal in contour.  CXR PA/LAT 11/09/15 (previously reviewed by me): No focal opacity or effusion appreciated. Heart normal in size. Mediastinum normal in contour. Mild hyperinflation with flattening of the diaphragms.   CARDIAC TTE (06/24/16): LVEF 45-50% with grade 2 diastolic dysfunction. LA moderately to severely dilated. RA normal in size. RV normal in size and function. No aortic stenosis or regurgitation. Moderate centrally directed mitral  regurgitation. Paradoxical ventricular septal motion consistent with intraventricular conduction delay. No pulmonic regurgitation. No tricuspid regurgitation. No pericardial effusion.  LABS 12/22/15 Alpha-1 antitrypsin: MM (143)    Assessment & Plan:  76 y.o. female with moderate COPD, right middle lobe nodule, chronic allergic rhinitis, & GERD with hiatal hernia. I reviewed her  chest CT scan with both she and her husband today. Certainly the 4 mm nodule could be between the 5 mm cuts. As the patient has no symptoms she agrees to hold off on further imaging at this time. Her COPD seems to be very well controlled at the moment and wants no adjustment in her current inhaler regimen. Additionally, her allergic rhinitis seems to be well-controlled. I instructed the patient contact my office if she had any breathing problems and encouraged her to inquire about a repeat course of pulmonary rehabilitation with her cardiologist.  1. Moderate COPD: Excellent symptom control on Symbicort and Spiriva. No changes in her current inhaler regimen at this time. 2. Right middle lobe nodule: Seems to have resolved on imaging. Holding off on further chest CT imaging. 3. Chronic allergic rhinitis: Continuing current regimen of saline rinse, Astelin nasal spray, Flonase, & Singulair. 4. GERD with hiatal hernia: Continuing Prilosec. Following with GI. 5. Health maintenance: Status post influenza vaccine September 2017, Prevnar December 2014, & Pneumovax January 2008. 6. Follow-up: Return to clinic in 6 months or sooner if needed.  Sonia Baller Ashok Cordia, M.D. [facility] Pager:  [phone] After 3pm or if no response, call [phone] 2:58 PM 02/06/17

## 2017-02-06 NOTE — Patient Instructions (Signed)
   Continue using your inhalers and medications as prescribed.  If you are cleared by your Cardiologist & would like to go back to pulmonary rehab to build up your endurance let me know.  I will see you back in 6 months or sooner if needed.

## 2017-02-09 ENCOUNTER — Ambulatory Visit: Payer: Medicare Other

## 2017-02-13 ENCOUNTER — Telehealth: Payer: Self-pay | Admitting: Pulmonary Disease

## 2017-02-13 ENCOUNTER — Ambulatory Visit (INDEPENDENT_AMBULATORY_CARE_PROVIDER_SITE_OTHER)
Admission: RE | Admit: 2017-02-13 | Discharge: 2017-02-13 | Disposition: A | Discharge status: Home / Self Care | Payer: Medicare Other | Source: Ambulatory Visit | Attending: Internal Medicine | Admitting: Internal Medicine

## 2017-02-13 DIAGNOSIS — E2839 Other primary ovarian failure: Secondary | ICD-10-CM

## 2017-02-13 NOTE — Telephone Encounter (Signed)
Spoke with pt and informed her that Dr. Ashok Cordia is not in the office today but we will contact her when it is ready.   Will forward to Nestor's nurse to follow up on

## 2017-02-16 NOTE — Telephone Encounter (Signed)
Has this been completed. Thanks.  

## 2017-02-17 NOTE — Telephone Encounter (Signed)
Spoke with patient and informed her the placard information was completed by [facility] and is ready. Pt ask that it be sent to her house via attached envelope connected to placard paper. She was informed mail has left for the day and it would be sent out on Monday. She did not have any additional questions. Nothing further is needed.

## 2017-02-20 NOTE — Telephone Encounter (Signed)
Placard was placed in outgoing mail. Nothing further is needed.

## 2017-02-23 ENCOUNTER — Ambulatory Visit
Admission: RE | Admit: 2017-02-23 | Discharge: 2017-02-23 | Disposition: A | Discharge status: Home / Self Care | Payer: Medicare Other | Source: Ambulatory Visit | Attending: Internal Medicine | Admitting: Internal Medicine

## 2017-02-23 DIAGNOSIS — Z1231 Encounter for screening mammogram for malignant neoplasm of breast: Secondary | ICD-10-CM | POA: Diagnosis not present

## 2017-02-27 ENCOUNTER — Encounter: Payer: Self-pay | Admitting: Internal Medicine

## 2017-02-27 DIAGNOSIS — R739 Hyperglycemia, unspecified: Secondary | ICD-10-CM

## 2017-02-27 DIAGNOSIS — E785 Hyperlipidemia, unspecified: Secondary | ICD-10-CM

## 2017-03-06 ENCOUNTER — Encounter: Payer: Self-pay | Admitting: Internal Medicine

## 2017-03-06 NOTE — Telephone Encounter (Signed)
To Cecille Rubin or Elsie Stain you know of a non hospital connected Nutrition clinic?  Thanks Dr Agustina Caroli

## 2017-03-07 NOTE — Progress Notes (Signed)
Pre and Post completed.  

## 2017-03-15 ENCOUNTER — Encounter: Payer: Self-pay | Admitting: Cardiology

## 2017-03-23 ENCOUNTER — Encounter (INDEPENDENT_AMBULATORY_CARE_PROVIDER_SITE_OTHER): Payer: Medicare Other | Admitting: Family Medicine

## 2017-03-24 ENCOUNTER — Encounter: Payer: Self-pay | Admitting: Internal Medicine

## 2017-03-24 MED ORDER — LORAZEPAM 0.5 MG PO TABS: ORAL_TABLET | ORAL | 5 refills | Status: DC

## 2017-03-24 NOTE — Telephone Encounter (Signed)
Done hardcopy to Shir[place] =   For meds by [employer] fax

## 2017-03-26 ENCOUNTER — Encounter: Payer: Self-pay | Admitting: Pulmonary Disease

## 2017-03-27 ENCOUNTER — Encounter: Payer: Self-pay | Admitting: Pulmonary Disease

## 2017-03-27 MED ORDER — TIOTROPIUM BROMIDE MONOHYDRATE 2.5 MCG/ACT IN AERS: 2.0000 | INHALATION_SPRAY | Freq: Every day | RESPIRATORY_TRACT | 3 refills | Status: DC

## 2017-03-28 MED ORDER — BUDESONIDE-FORMOTEROL FUMARATE 160-4.5 MCG/ACT IN AERO
2.0000 | INHALATION_SPRAY | Freq: Two times a day (BID) | RESPIRATORY_TRACT | 4 refills | Status: DC
Start: 2017-03-28 — End: 2017-12-28

## 2017-04-03 ENCOUNTER — Encounter: Payer: Self-pay | Admitting: Cardiology

## 2017-04-03 ENCOUNTER — Ambulatory Visit (INDEPENDENT_AMBULATORY_CARE_PROVIDER_SITE_OTHER): Payer: Medicare Other | Admitting: Cardiology

## 2017-04-03 VITALS — BP 150/78 | HR 67 | Ht 64.0 in | Wt 130.0 lb

## 2017-04-03 DIAGNOSIS — Z79899 Other long term (current) drug therapy: Secondary | ICD-10-CM

## 2017-04-03 DIAGNOSIS — I480 Paroxysmal atrial fibrillation: Secondary | ICD-10-CM

## 2017-04-03 DIAGNOSIS — I5042 Chronic combined systolic (congestive) and diastolic (congestive) heart failure: Secondary | ICD-10-CM | POA: Diagnosis not present

## 2017-04-03 DIAGNOSIS — I493 Ventricular premature depolarization: Secondary | ICD-10-CM

## 2017-04-03 LAB — HEPATIC FUNCTION PANEL
ALT: 19 IU/L (ref 0–32)
AST: 21 IU/L (ref 0–40)
Albumin: 3.9 g/dL (ref 3.5–4.8)
Alkaline Phosphatase: 94 IU/L (ref 39–117)
Bilirubin Total: 0.3 mg/dL (ref 0.0–1.2)
Bilirubin, Direct: 0.13 mg/dL (ref 0.00–0.40)
Total Protein: 6.1 g/dL (ref 6.0–8.5)

## 2017-04-03 LAB — TSH: TSH: 7.89 u[IU]/mL — ABNORMAL HIGH (ref 0.450–4.500)

## 2017-04-03 MED ORDER — METOPROLOL SUCCINATE ER 100 MG PO TB24: 100.0000 mg | ORAL_TABLET | Freq: Every day | ORAL | 3 refills | Status: DC

## 2017-04-03 NOTE — Progress Notes (Signed)
Electrophysiology Office Note   Date:  04/03/2017   ID:  Lauren, Ray 06/25/1941, MRN [id number]  PCP:  Cathlean Cower, MD  Primary Electrophysiologist:  Jermain Curt Meredith Leeds, MD    Chief Complaint  Patient presents with  . Follow-up    PVC's/PAF     History of Present Illness: [name] is a 76 y.o. female who presents today for electrophysiology evaluation.   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 Admitted 08/17/16 with ruptured colon status post right hemicolectomy with ileostomy for ischemic and necrotic colon.  Cardioversion 10/11/16. He also had ileostomy takedown and repair that was complicated by C. difficile.  Main complaint today is of fatigue and leg heaviness. She cannot clean her house or do much of her daily activities without having to stop and catch her breath. She also has to sit down and rest. She says her legs feel like they are weighed to the ground. She has difficulty walking short distances.   Past Medical History:  Diagnosis Date  . Allergic rhinitis   . Anxiety   . Atrial septal aneurysm   . Basal cell carcinoma   . Bradycardia   . Cataract    bil cataracts removed  . Chronic combined systolic and diastolic CHF (congestive heart failure) ([place])   . CKD (chronic kidney disease), stage III   . Clostridium difficile infection   . COPD (chronic obstructive pulmonary disease) ([place])   . Depression 11/29/2013  . DI (detrusor instability)   . Esophageal stricture   . GERD (gastroesophageal reflux disease)   . Hemorrhoids   . Hiatal hernia   . History of kidney stones   . HLD (hyperlipidemia)   . HTN (hypertension)   . Ischemic  colon ([place])    a. 08/2016 - adm to  [facility] with a ruptured colon s/p right hemicolectomy with ileostomy formation for ischemic and necrotic colon.   . Lung nodule    a. followed by pulmonology  . Mild CAD    a. minor nonobstructive CAD 08/22/16.  . Nephrolithiasis    hx  . NICM (nonischemic cardiomyopathy) ([place])   . Orthostatic hypotension   . Osteoarthritis   . PAF (paroxysmal atrial fibrillation) ([place])    a. diagnosed 07/2016 s/p DCCV 08/01/16 with recurrence, managed with rate control for now.   . Pneumonia 2013  . PVC's (premature ventricular contractions)   . Shingles since Nov 18, 2013   on right arm from shoulder to wrist  . Status post dilation of esophageal narrowing   . Stroke [facility]) 2001   STROKES, TIA'S  . Syncope    a. 08/2016 felt due to orthostatic hypotension.  Marland Kitchen TIA (transient ischemic attack) last 2001   anticoagulation therapy on coumadin   Past Surgical History:  Procedure Laterality Date  . ANTERIOR AND POSTERIOR VAGINAL REPAIR  2008   A&P REPAIR-DR MCDIARMID  . ANTERIOR LATERAL LUMBAR FUSION 4 LEVELS Right 07/03/2015   Procedure: Right Lumbar one-two, Lumbar two-three, Lumbar three-four, Lumbar four-five Anterior lateral lumbar interbody fusion;  Surgeon: Erline Levine, MD;  Location: [place] [facility]URO ORS;  Service: Neurosurgery;  Laterality: Right;  Right L1-2 L2-3 L3-4 L4-5 Anterior lateral lumbar interbody fusion  . BALLOON DILATION N/A 03/03/2014  Procedure: BALLOON DILATION;  Surgeon: Inda Castle, MD;  Location: [facility];  Service: Endoscopy;  Laterality: N/A;  . BLADDER SUSPENSION    . BREAST BIOPSY    . CARDIAC CATHETERIZATION N/A 08/22/2016   Procedure: Left Heart Cath and Coronary Angiography;  Surgeon: Peter M Martinique, MD;  Location: [place] [facility] CV LAB;  Service: Cardiovascular;  Laterality: N/A;  . CARDIOVERSION N/A 08/01/2016   Procedure: CARDIOVERSION;  Surgeon: Jerline Pain, MD;  Location: [facility];  Service: Cardiovascular;  Laterality:  N/A;  . CARDIOVERSION N/A 10/11/2016   Procedure: CARDIOVERSION;  Surgeon: Sueanne Margarita, MD;  Location: [facility]OS[facility];  Service: Cardiovascular;  Laterality: N/A;  . COLONOSCOPY    . ESOPHAGOGASTRODUODENOSCOPY N/A 03/03/2014   Procedure: ESOPHAGOGASTRODUODENOSCOPY (EGD);  Surgeon: Inda Castle, MD;  Location: [facility];  Service: Endoscopy;  Laterality: N/A;  . EXCISION OF BASAL CELL CA  2011  . GLUTEUS MINIMUS REPAIR Right 03/15/2016   Procedure: RIGHT HIP GLUTEUS MEDIUS TENDON REPAIR;  Surgeon: Paralee Cancel, MD;  Location: [facility] ORS;  Service: Orthopedics;  Laterality: Right;  . ileostomy reversal    . LUMBAR PERCUTANEOUS PEDICLE SCREW 4 LEVEL Right 07/03/2015   Procedure: Lumbar one-Sacral one Bilateral percutaneous pedicle screws;  Surgeon: Erline Levine, MD;  Location: [facility]O ORS;  Service: Neurosurgery;  Laterality: Right;  L1-S1 Bilateral percutaneous pedicle screws  . RECTOCELE REPAIR  2008   A&P REPAIR -DR. MCDIARMID  . THUMB SURGERY Right 10-15 yrs ago  . UPPER GASTROINTESTINAL ENDOSCOPY    . VAGINAL HYSTERECTOMY  1983   NO BSO-DR. MABRY  . VARICOSE VEIN SURGERY       Current Outpatient Prescriptions  Medication Sig Dispense Refill  . albuterol (PROVENTIL HFA;VENTOLIN HFA) 108 (90 BASE) MCG/ACT inhaler Inhale 1-2 puffs into the lungs every 6 (six) hours as needed for wheezing or shortness of breath. 3 Inhaler 4  . amiodarone (PACERONE) 200 MG tablet Take 1 tablet (200 mg total) by mouth daily. 90 tablet 3  . apixaban (ELIQUIS) 5 MG TABS tablet Take 1 tablet (5 mg total) by mouth 2 (two) times daily. 180 tablet 1  . azelastine (ASTELIN) 0.1 % nasal spray Place 2 sprays into the nose daily. Use in each nostril as directed (Patient taking differently: Place 2 sprays into both nostrils daily. ) 90 mL 1  . benzonatate (TESSALON) 100 MG capsule Take 1 capsule (100 mg total) by mouth 3 (three) times daily as needed for cough. 90 capsule 0  . budesonide-formoterol (SYMBICORT) 160-4.5  MCG/ACT inhaler Inhale 2 puffs into the lungs 2 (two) times daily. 3 Inhaler 4  . buPROPion (WELLBUTRIN XL) 300 MG 24 hr tablet Take 1 tablet (300 mg total) by mouth daily. 90 tablet 3  . Calcium Carbonate-Vitamin D (CALCIUM + D PO) Take 1 tablet by mouth 2 (two) times daily.     . cyclobenzaprine (FLEXERIL) 5 MG tablet Take 1 tablet (5 mg total) by mouth 3 (three) times daily as needed for muscle spasms. 270 tablet 0  . fluticasone (FLONASE) 50 MCG/ACT nasal spray Place 1 spray into both nostrils 2 (two) times daily. 48 g 1  . furosemide (LASIX) 20 MG tablet Take 1 tablet (20 mg total) by mouth as needed (swelling). 90 tablet 0  . guaiFENesin (MUCINEX) 600 MG 12 hr tablet Take 1,200 mg by mouth 2 (two) times daily as needed for cough or to loosen phlegm.    . Lidocaine HCl 2 % CREA Apply 1 application topically daily as  needed (for shingles flares).    . LORazepam (ATIVAN) 0.5 MG tablet 1-2 tab by mouth at bedtime as needed for sleep 60 tablet 5  . Melatonin 5 MG TABS Take 5 mg by mouth at bedtime.    . mirabegron ER (MYRBETRIQ) 50 MG TB24 tablet Take 50 mg by mouth daily.    . montelukast (SINGULAIR) 10 MG tablet Take 10 mg by mouth daily.    . Multiple Vitamin (MULTIVITAMIN) tablet Take 1 tablet by mouth daily.      Marland Kitchen nystatin (MYCOSTATIN) 100000 UNIT/ML suspension Take 5 cc by mouth, swish and swallow 4 times daily. 280 mL 0  . omeprazole (PRILOSEC) 40 MG capsule Take 1 capsule (40 mg total) by mouth daily. 90 capsule 3  . Probiotic Product (PROBIOTIC DAILY PO) Take by mouth.    . Respiratory Therapy Supplies (FLUTTER) DEVI Use 2-3 times per day 1 each 0  . Spacer/Aero-Holding Chambers (AEROCHAMBER Z-STAT PLUS) inhaler Use as instructed 1 each 0  . Tiotropium Bromide Monohydrate (SPIRIVA RESPIMAT) 2.5 MCG/ACT AERS Inhale 2 puffs into the lungs daily. 3 Inhaler 3  . ezetimibe (ZETIA) 10 MG tablet Take 1 tablet (10 mg total) by mouth daily. 90 tablet 3  . metoprolol succinate (TOPROL-XL) 100 MG  24 hr tablet Take 1 tablet (100 mg total) by mouth daily. Take with or immediately following a meal. 90 tablet 3   No current facility-administered medications for this visit.     Allergies:   Clarithromycin; Lipitor [atorvastatin]; Simvastatin; Cardizem [diltiazem hcl]; Contrast media [iodinated diagnostic agents]; and Crestor [rosuvastatin calcium]   Social History:  The patient  reports that she quit smoking about 21 years ago. Her smoking use included Cigarettes. She started smoking about 62 years ago. She has a 126.00 pack-year smoking history. She has never used smokeless tobacco. She reports that she does not drink alcohol or use drugs.   Family History:  The patient's family history includes Breast cancer in her maternal aunt, maternal aunt, maternal aunt, and mother; COPD in her mother; Emphysema in her mother; Uterine cancer in her mother.    ROS:  Please see the history of present illness.   Otherwise, review of systems is positive for Dyspnea on exertion, visual changes.   All other systems are reviewed and negative.    PHYSICAL EXAM: VS:  BP (!) 150/78   Pulse 67   Ht 5\' 4"  (1.626 m)   Wt 130 lb (59 kg)   BMI 22.31 kg/m  , BMI Body mass index is 22.31 kg/m. GEN: Well nourished, well developed, in no acute distress  HEENT: normal  Neck: no JVD, carotid bruits, or masses Cardiac: RRR; 2/6 systolic murmur at the base, no rubs, or gallops,no edema  Respiratory:  clear to auscultation bilaterally, normal work of breathing GI: soft, nontender, nondistended, + BS MS: no deformity or atrophy  Skin: warm and dry Neuro:  Strength and sensation are intact Psych: euthymic mood, full affect   EKG:  EKG is ordered today. Personal review of the ekg ordered shows sinus rhythm, first-degree AV block, rate 67, left anterior fascicular block, septal Q waves Recent Labs: 08/17/2016: B Natriuretic Peptide 343.5 09/13/2016: ALT 18; Pro B Natriuretic peptide (BNP) 202.0 09/16/2016:  Magnesium 1.7; Platelets 346; TSH 2.72 10/11/2016: BUN 22; Creatinine, Ser 1.30; Hemoglobin 12.2; Potassium 4.4; Sodium 139    Lipid Panel     Component Value Date/Time   CHOL 239 (H) 11/12/2015 1142   TRIG 122.0 11/12/2015 1142  HDL 49.70 11/12/2015 1142   CHOLHDL 5 11/12/2015 1142   VLDL 24.4 11/12/2015 1142   LDLCALC 165 (H) 11/12/2015 1142   LDLDIRECT 241.1 12/02/2014 1528     Wt Readings from Last 3 Encounters:  04/03/17 130 lb (59 kg)  02/06/17 129 lb 3.2 oz (58.6 kg)  01/03/17 125 lb 6.4 oz (56.9 kg)      Other studies Reviewed: Additional studies/ records that were reviewed today include: TTE 06/24/16, Cath 08/22/16, Telemetry 06/14/16  Review of the above records today demonstrates:  TTE - Left ventricle: The cavity size was at the upper limits of   normal. Wall thickness was normal. Systolic function was mildly   reduced. The estimated ejection fraction was in the range of 45%   to 50%. Diffuse hypokinesis. Features are consistent with a   pseudonormal left ventricular filling pattern, with concomitant   abnormal relaxation and increased filling pressure (grade 2   diastolic dysfunction). - Ventricular septum: Septal motion showed paradox. These changes   are consistent with intraventricular conduction delay. - Mitral valve: There was moderate regurgitation directed   centrally. - Left atrium: The atrium was moderately to severely dilated. - Atrial septum: The septum bowed from left to right, consistent   with increased left atrial pressure. No defect or patent foramen   ovale was identified.  Cath  Prox LAD to Mid LAD lesion, 15 %stenosed.  Ost Cx to Prox Cx lesion, 15 %stenosed.  Prox RCA to Mid RCA lesion, 20 %stenosed.  There is mild left ventricular systolic dysfunction.  LV end diastolic pressure is normal.  The left ventricular ejection fraction is 45-50% by visual estimate.   1. Minor nonobstructive CAD 2. Mild LV dysfunction with distal  inferior HK 3. Measurement of LV pressures limited by frequent PVCs but EDP appears normal.   Telemetry 1. NSR with PVC's, at times in a bigeminal manner 2. Signal drop out as well as noise artifact are present.  3. No sustained atrial or ventricular arrhythmias 4. No pauses   ASSESSMENT AND PLAN:  1.  Paroxysmal atrial fibrillation: On Eliquis, Toprol-XL, and amiodarone. In sinus rhythm today. It is unlikely that her atrial fibrillation is contributing to her fatigue.  This patients CHA2DS2-VASc Score and unadjusted Ischemic Stroke Rate (% per year) is equal to 7.2 % stroke rate/year from a score of 5  Above score calculated as 1 point each if present [CHF, HTN, DM, Vascular=MI/PAD/Aortic Plaque, Age if 65-74, or Female] Above score calculated as 2 points each if present [Age > 75, or Stroke/TIA/TE]    2. Chronic combined systolic HF: Currently well compensated on Toprol-XL. Adjusting Toprol-XL today. We'll get a transthoracic echo to reassess her LV function. She may benefit from lisinopril in the future. She Pao Haffey call back if she has any further symptoms of fatigue.  3. HTN: Blood pressure is elevated today. We are adjusting her Toprol-XL dosage. We'll plan to recheck with her in 2 weeks as to her blood pressure results.  4. PVCs: Decreased in number on her EKG performed today. Continue amiodarone.   Current medicines are reviewed at length with the patient today.   The patient does not have concerns regarding her medicines.  The following changes were made today:  Decrease Toprol XL to daily  Labs/ tests ordered today include:  Orders Placed This Encounter  Procedures  . EKG 12-Lead  . ECHOCARDIOGRAM COMPLETE     Disposition:   FU with Brittan Mapel 3 months  Signed, Bryla Burek [employer]  Curt Bears, MD  04/03/2017 11:16 AM     [facility] [address] [place] [address] [phone] (office) [phone] (fax)

## 2017-04-03 NOTE — Patient Instructions (Addendum)
Medication Instructions: Decrease Metoprolol to 100 mg once daily.  Labwork: Medication surveillance labs: TSH & HFP  Procedures/Testing: Your physician has requested that you have an echocardiogram. Echocardiography is a painless test that uses sound waves to create images of your heart. It provides your doctor with information about the size and shape of your heart and how well your heart's chambers and valves are working. This procedure takes approximately one hour. There are no restrictions for this procedure.   Follow-Up: Your physician recommends that you schedule a follow-up appointment in: 3 months with Dr. Curt Bears.  If you need a refill on your cardiac medications before your next appointment, please call your pharmacy.   Any Additional Special Instructions Will Be Listed Below (If Applicable).  Check your Blood pressure over the next few weeks. Call the office if your blood pressure remains elevated.   Echocardiogram An echocardiogram, or echocardiography, uses sound waves (ultrasound) to produce an image of your heart. The echocardiogram is simple, painless, obtained within a short period of time, and offers valuable information to your health care provider. The images from an echocardiogram can provide information such as:  Evidence of coronary artery disease (CAD).  Heart size.  Heart muscle function.  Heart valve function.  Aneurysm detection.  Evidence of a past heart attack.  Fluid buildup around the heart.  Heart muscle thickening.  Assess heart valve function. Tell a health care provider about:  Any allergies you have.  All medicines you are taking, including vitamins, herbs, eye drops, creams, and over-the-counter medicines.  Any problems you or family members have had with anesthetic medicines.  Any blood disorders you have.  Any surgeries you have had.  Any medical conditions you have.  Whether you are pregnant or may be pregnant. What  happens before the procedure? No special preparation is needed. Eat and drink normally. What happens during the procedure?  In order to produce an image of your heart, gel will be applied to your chest and a wand-like tool (transducer) will be moved over your chest. The gel will help transmit the sound waves from the transducer. The sound waves will harmlessly bounce off your heart to allow the heart images to be captured in real-time motion. These images will then be recorded.  You may need an IV to receive a medicine that improves the quality of the pictures. What happens after the procedure? You may return to your normal schedule including diet, activities, and medicines, unless your health care provider tells you otherwise. This information is not intended to replace advice given to you by your health care provider. Make sure you discuss any questions you have with your health care provider. Document Released: 11/18/2000 Document Revised: 07/09/2016 Document Reviewed: 07/29/2013 Elsevier Interactive Patient Education  2017 [employer].

## 2017-04-05 ENCOUNTER — Other Ambulatory Visit: Payer: Self-pay

## 2017-04-05 ENCOUNTER — Ambulatory Visit (HOSPITAL_COMMUNITY): Payer: Medicare Other | Attending: Cardiology

## 2017-04-05 DIAGNOSIS — I5042 Chronic combined systolic (congestive) and diastolic (congestive) heart failure: Secondary | ICD-10-CM

## 2017-04-05 DIAGNOSIS — Z87891 Personal history of nicotine dependence: Secondary | ICD-10-CM | POA: Insufficient documentation

## 2017-04-05 DIAGNOSIS — I071 Rheumatic tricuspid insufficiency: Secondary | ICD-10-CM | POA: Insufficient documentation

## 2017-04-05 DIAGNOSIS — I340 Nonrheumatic mitral (valve) insufficiency: Secondary | ICD-10-CM | POA: Diagnosis not present

## 2017-04-05 DIAGNOSIS — J449 Chronic obstructive pulmonary disease, unspecified: Secondary | ICD-10-CM | POA: Diagnosis not present

## 2017-04-05 DIAGNOSIS — E785 Hyperlipidemia, unspecified: Secondary | ICD-10-CM | POA: Insufficient documentation

## 2017-04-05 DIAGNOSIS — I4891 Unspecified atrial fibrillation: Secondary | ICD-10-CM | POA: Insufficient documentation

## 2017-04-05 DIAGNOSIS — G459 Transient cerebral ischemic attack, unspecified: Secondary | ICD-10-CM | POA: Insufficient documentation

## 2017-04-05 DIAGNOSIS — J984 Other disorders of lung: Secondary | ICD-10-CM | POA: Insufficient documentation

## 2017-04-05 DIAGNOSIS — I110 Hypertensive heart disease with heart failure: Secondary | ICD-10-CM | POA: Diagnosis not present

## 2017-04-06 ENCOUNTER — Encounter: Payer: Self-pay | Admitting: Internal Medicine

## 2017-04-06 ENCOUNTER — Telehealth: Payer: Self-pay | Admitting: *Deleted

## 2017-04-06 DIAGNOSIS — L219 Seborrheic dermatitis, unspecified: Secondary | ICD-10-CM | POA: Diagnosis not present

## 2017-04-06 NOTE — Telephone Encounter (Signed)
Reviewed results with patient who verbalized understanding. Forwarded result to PCP.  Pt will call primary

## 2017-04-06 NOTE — Telephone Encounter (Signed)
-----   Message from Will Meredith Leeds, MD sent at 04/04/2017  7:21 AM EDT ----- Hepatic function normal, TSH elevated. Stopping amiodarone. Needs to be sent to PCP to address hypothyroidism. May be the cause of all her symptoms.

## 2017-04-07 ENCOUNTER — Telehealth: Payer: Self-pay | Admitting: Internal Medicine

## 2017-04-07 DIAGNOSIS — Z1329 Encounter for screening for other suspected endocrine disorder: Secondary | ICD-10-CM

## 2017-04-07 DIAGNOSIS — Z0000 Encounter for general adult medical examination without abnormal findings: Secondary | ICD-10-CM

## 2017-04-07 DIAGNOSIS — I10 Essential (primary) hypertension: Secondary | ICD-10-CM

## 2017-04-07 DIAGNOSIS — Z13220 Encounter for screening for lipoid disorders: Secondary | ICD-10-CM

## 2017-04-07 NOTE — Telephone Encounter (Signed)
Pt has appt 5/9, she would like orders put in for her to get blood work done before the appt.   Please advise.

## 2017-04-07 NOTE — Telephone Encounter (Signed)
Spoke with pt husband and he will get the msg to [place] concerning her labs.

## 2017-04-07 NOTE — Telephone Encounter (Signed)
I believe this pt has traditional medicare, which does not pay for physical (using the "routine physical exam" code)  Labs are not paid for prior to visit, so I would not order any  I have no control over her insurance and how they pay

## 2017-04-07 NOTE — Telephone Encounter (Signed)
Dr Jenny Reichmann this pt wants to come have labs done before CPE appt. However the TSH and Lipid is giving me an error because I can not associate with wellness exam or screenings. Im not sure how to associate those 2 labs. Please advise.

## 2017-04-12 ENCOUNTER — Encounter: Payer: Self-pay | Admitting: Internal Medicine

## 2017-04-12 ENCOUNTER — Ambulatory Visit (INDEPENDENT_AMBULATORY_CARE_PROVIDER_SITE_OTHER): Payer: Medicare Other | Admitting: Internal Medicine

## 2017-04-12 ENCOUNTER — Telehealth: Payer: Self-pay | Admitting: Internal Medicine

## 2017-04-12 VITALS — BP 136/80 | HR 69 | Ht 64.0 in | Wt 133.0 lb

## 2017-04-12 DIAGNOSIS — E7800 Pure hypercholesterolemia, unspecified: Secondary | ICD-10-CM | POA: Diagnosis not present

## 2017-04-12 DIAGNOSIS — I10 Essential (primary) hypertension: Secondary | ICD-10-CM

## 2017-04-12 DIAGNOSIS — R946 Abnormal results of thyroid function studies: Secondary | ICD-10-CM | POA: Diagnosis not present

## 2017-04-12 DIAGNOSIS — B0229 Other postherpetic nervous system involvement: Secondary | ICD-10-CM

## 2017-04-12 DIAGNOSIS — R7989 Other specified abnormal findings of blood chemistry: Secondary | ICD-10-CM

## 2017-04-12 DIAGNOSIS — R739 Hyperglycemia, unspecified: Secondary | ICD-10-CM | POA: Diagnosis not present

## 2017-04-12 DIAGNOSIS — D649 Anemia, unspecified: Secondary | ICD-10-CM

## 2017-04-12 MED ORDER — GABAPENTIN 300 MG PO CAPS
ORAL_CAPSULE | ORAL | 5 refills | Status: DC
Start: 2017-04-12 — End: 2018-03-05

## 2017-04-12 MED ORDER — ZOSTER VAC RECOMB ADJUVANTED 50 MCG/0.5ML IM SUSR: 0.5000 mL | Freq: Once | INTRAMUSCULAR | 1 refills | Status: AC

## 2017-04-12 MED ORDER — GABAPENTIN 100 MG PO CAPS: ORAL_CAPSULE | ORAL | 0 refills | Status: DC

## 2017-04-12 NOTE — Telephone Encounter (Signed)
Is requesting 100mg  Gabapentin to be sent to [facility] instead of meds by mail.

## 2017-04-12 NOTE — Progress Notes (Signed)
Subjective:    Patient ID: [name], female    DOB: 08-Apr-1941, 76 y.o.   MRN: [id number]  HPI   Here to f/u; Pt denies chest pain, increased sob or doe, wheezing, orthopnea, PND, increased LE swelling, palpitations, dizziness or syncope. Amiodarone stopped x 2 wks.  BP tends to run higher in the am, but better later in the day;  Has f/u with cardiology in july 2018., may be considered for ablation later this yr. Pt denies new neurological symptoms such as new headache, or facial or extremity weakness or numbness, but still with significant PHN to RUE. Also still not sleeping well, mostly due to pain and stress.  Tripped over shoes 2 wk ago with bruising to right flank area still somewhat evident.  No other hx changes. Denies hyper or hypo thyroid symptoms such as voice, skin or hair change. Past Medical History:  Diagnosis Date  . Allergic rhinitis   . Anxiety   . Atrial septal aneurysm   . Basal cell carcinoma   . Bradycardia   . Cataract    bil cataracts removed  . Chronic combined systolic and diastolic CHF (congestive heart failure) ([place])   . CKD (chronic kidney disease), stage III   . Clostridium difficile infection   . COPD (chronic obstructive pulmonary disease) ([place])   . Depression 11/29/2013  . DI (detrusor instability)   . Esophageal stricture   . GERD (gastroesophageal reflux disease)   . Hemorrhoids   . Hiatal hernia   . History of kidney stones   . HLD (hyperlipidemia)   . HTN (hypertension)   . Ischemic colon ([place])    a. 08/2016 - adm to  [facility] with a ruptured colon s/p right hemicolectomy with ileostomy formation for ischemic and necrotic colon.   . Lung nodule    a. followed by pulmonology  . Mild CAD    a. minor nonobstructive CAD 08/22/16.  . Nephrolithiasis    hx  . NICM (nonischemic cardiomyopathy) ([place])   . Orthostatic hypotension   . Osteoarthritis   . PAF (paroxysmal atrial fibrillation) ([place])    a. diagnosed 07/2016 s/p DCCV 08/01/16  with recurrence, managed with rate control for now.   . Pneumonia 2013  . PVC's (premature ventricular contractions)   . Shingles since Nov 18, 2013   on right arm from shoulder to wrist  . Status post dilation of esophageal narrowing   . Stroke [facility]) 2001   STROKES, TIA'S  . Syncope    a. 08/2016 felt due to orthostatic hypotension.  Marland Kitchen TIA (transient ischemic attack) last 2001   anticoagulation therapy on coumadin   Past Surgical History:  Procedure Laterality Date  . ANTERIOR AND POSTERIOR VAGINAL REPAIR  2008   A&P REPAIR-DR MCDIARMID  . ANTERIOR LATERAL LUMBAR FUSION 4 LEVELS Right 07/03/2015   Procedure: Right Lumbar one-two, Lumbar two-three, Lumbar three-four, Lumbar four-five Anterior lateral lumbar interbody fusion;  Surgeon: Erline Levine, MD;  Location: [facility] NEURO ORS;  Service: Neurosurgery;  Laterality: Right;  Right L1-2 L2-3 L3-4 L4-5 Anterior lateral lumbar interbody fusion  . BALLOON DILATION N/A 03/03/2014   Procedure: BALLOON DILATION;  Surgeon: Inda Castle, MD;  Location: [facility] ENDOSCOPY;  Service: Endoscopy;  Laterality: N/A;  . BLADDER SUSPENSION    . BREAST BIOPSY    . CARDIAC CATHETERIZATION N/A 08/22/2016   Procedure: Left Heart Cath and Coronary Angiography;  Surgeon: Peter M Martinique, MD;  Location: [facility] CV LAB;  Service: Cardiovascular;  Laterality:  N/A;  . CARDIOVERSION N/A 08/01/2016   Procedure: CARDIOVERSION;  Surgeon: Jerline Pain, MD;  Location: [facility];  Service: Cardiovascular;  Laterality: N/A;  . CARDIOVERSION N/A 10/11/2016   Procedure: CARDIOVERSION;  Surgeon: Sueanne Margarita, MD;  Location: [facility] ENDOSCOPY;  Service: Cardiovascular;  Laterality: N/A;  . COLONOSCOPY    . ESOPHAGOGASTRODUODENOSCOPY N/A 03/03/2014   Procedure: ESOPHAGOGASTRODUODENOSCOPY (EGD);  Surgeon: Inda Castle, MD;  Location: [facility];  Service: Endoscopy;  Laterality: N/A;  . EXCISION OF BASAL CELL CA  2011  . GLUTEUS MINIMUS REPAIR Right 03/15/2016   Procedure: RIGHT  HIP GLUTEUS MEDIUS TENDON REPAIR;  Surgeon: Paralee Cancel, MD;  Location: [facility] ORS;  Service: Orthopedics;  Laterality: Right;  . ileostomy reversal    . LUMBAR PERCUTANEOUS PEDICLE SCREW 4 LEVEL Right 07/03/2015   Procedure: Lumbar one-Sacral one Bilateral percutaneous pedicle screws;  Surgeon: Erline Levine, MD;  Location: [facility] NEURO ORS;  Service: Neurosurgery;  Laterality: Right;  L1-S1 Bilateral percutaneous pedicle screws  . RECTOCELE REPAIR  2008   A&P REPAIR -DR. MCDIARMID  . THUMB SURGERY Right 10-15 yrs ago  . UPPER GASTROINTESTINAL ENDOSCOPY    . VAGINAL HYSTERECTOMY  1983   NO BSO-DR. MABRY  . VARICOSE VEIN SURGERY      reports that she quit smoking about 21 years ago. Her smoking use included Cigarettes. She started smoking about 62 years ago. She has a 126.00 pack-year smoking history. She has never used smokeless tobacco. She reports that she does not drink alcohol or use drugs. family history includes Breast cancer in her maternal aunt, maternal aunt, maternal aunt, and mother; COPD in her mother; Emphysema in her mother; Uterine cancer in her mother. Allergies  Allergen Reactions  . Clarithromycin Other (See Comments)    REACTION: possible rash but could have been legionarres dz with rash  . Lipitor [Atorvastatin] Other (See Comments)    MUSCLE SPASMS  . Simvastatin Other (See Comments)    Muscle and joint pain  . Cardizem [Diltiazem Hcl] Hives  . Contrast Media [Iodinated Diagnostic Agents]     FYI - during admission at [facility] 08/2016 patient developed hives, question related to cardizem or contrast dye - not clear  . Crestor [Rosuvastatin Calcium]     fagitue and joint pain, "NO STATINS"   Current Outpatient Prescriptions on File Prior to Visit  Medication Sig Dispense Refill  . albuterol (PROVENTIL HFA;VENTOLIN HFA) 108 (90 BASE) MCG/ACT inhaler Inhale 1-2 puffs into the lungs every 6 (six) hours as needed for wheezing or shortness of breath. 3 Inhaler 4  .  apixaban (ELIQUIS) 5 MG TABS tablet Take 1 tablet (5 mg total) by mouth 2 (two) times daily. 180 tablet 1  . azelastine (ASTELIN) 0.1 % nasal spray Place 2 sprays into the nose daily. Use in each nostril as directed (Patient taking differently: Place 2 sprays into both nostrils daily. ) 90 mL 1  . benzonatate (TESSALON) 100 MG capsule Take 1 capsule (100 mg total) by mouth 3 (three) times daily as needed for cough. 90 capsule 0  . budesonide-formoterol (SYMBICORT) 160-4.5 MCG/ACT inhaler Inhale 2 puffs into the lungs 2 (two) times daily. 3 Inhaler 4  . buPROPion (WELLBUTRIN XL) 300 MG 24 hr tablet Take 1 tablet (300 mg total) by mouth daily. 90 tablet 3  . Calcium Carbonate-Vitamin D (CALCIUM + D PO) Take 1 tablet by mouth 2 (two) times daily.     . cyclobenzaprine (FLEXERIL) 5 MG tablet Take 1 tablet (5  mg total) by mouth 3 (three) times daily as needed for muscle spasms. 270 tablet 0  . fluticasone (FLONASE) 50 MCG/ACT nasal spray Place 1 spray into both nostrils 2 (two) times daily. 48 g 1  . furosemide (LASIX) 20 MG tablet Take 1 tablet (20 mg total) by mouth as needed (swelling). 90 tablet 0  . guaiFENesin (MUCINEX) 600 MG 12 hr tablet Take 1,200 mg by mouth 2 (two) times daily as needed for cough or to loosen phlegm.    . Lidocaine HCl 2 % CREA Apply 1 application topically daily as needed (for shingles flares).    . LORazepam (ATIVAN) 0.5 MG tablet 1-2 tab by mouth at bedtime as needed for sleep 60 tablet 5  . Melatonin 5 MG TABS Take 5 mg by mouth at bedtime.    . metoprolol succinate (TOPROL-XL) 100 MG 24 hr tablet Take 1 tablet (100 mg total) by mouth daily. Take with or immediately following a meal. 90 tablet 3  . mirabegron ER (MYRBETRIQ) 50 MG TB24 tablet Take 50 mg by mouth daily.    . montelukast (SINGULAIR) 10 MG tablet Take 10 mg by mouth daily.    . Multiple Vitamin (MULTIVITAMIN) tablet Take 1 tablet by mouth daily.      Marland Kitchen nystatin (MYCOSTATIN) 100000 UNIT/ML suspension Take 5 cc  by mouth, swish and swallow 4 times daily. 280 mL 0  . omeprazole (PRILOSEC) 40 MG capsule Take 1 capsule (40 mg total) by mouth daily. 90 capsule 3  . Probiotic Product (PROBIOTIC DAILY PO) Take by mouth.    . Respiratory Therapy Supplies (FLUTTER) DEVI Use 2-3 times per day 1 each 0  . Spacer/Aero-Holding Chambers (AEROCHAMBER Z-STAT PLUS) inhaler Use as instructed 1 each 0  . Tiotropium Bromide Monohydrate (SPIRIVA RESPIMAT) 2.5 MCG/ACT AERS Inhale 2 puffs into the lungs daily. 3 Inhaler 3  . ezetimibe (ZETIA) 10 MG tablet Take 1 tablet (10 mg total) by mouth daily. 90 tablet 3  . [DISCONTINUED] valsartan (DIOVAN) 160 MG tablet Take 1 tablet (160 mg total) by mouth daily. 90 tablet 3   No current facility-administered medications on file prior to visit.    Review of Systems  Constitutional: Negative for other unusual diaphoresis or sweats HENT: Negative for ear discharge or swelling Eyes: Negative for other worsening visual disturbances Respiratory: Negative for stridor or other swelling  Gastrointestinal: Negative for worsening distension or other blood Genitourinary: Negative for retention or other urinary change Musculoskeletal: Negative for other MSK pain or swelling Skin: Negative for color change or other new lesions Neurological: Negative for worsening tremors and other numbness  Psychiatric/Behavioral: Negative for worsening agitation or other fatigue All other system neg per pt    Objective:   Physical Exam BP 136/80   Pulse 69   Ht 5\' 4"  (1.626 m)   Wt 133 lb (60.3 kg)   SpO2 97%   BMI 22.83 kg/m  VS noted, not ill appearing Constitutional: Pt appears in NAD HENT: Head: NCAT.  Right Ear: External ear normal.  Left Ear: External ear normal.  Eyes: . Pupils are equal, round, and reactive to light. Conjunctivae and EOM are normal Nose: without d/c or deformity Neck: Neck supple. Gross normal ROM Cardiovascular: Normal rate and regular rhythm.   Pulmonary/Chest:  Effort normal and breath sounds without rales or wheezing.  Neurological: Pt is alert. At baseline orientation, motor grossly intact Skin: Skin is warm. No rashes, other new lesions, no LE edema Psychiatric: Pt behavior is normal without  agitation  No other exam findings    Assessment & Plan:

## 2017-04-12 NOTE — Patient Instructions (Addendum)
We will send the prescription for Shingrix to your pharmacy.  Please take all new medication as prescribed - the gabapentin at 100 mg three times per day for the first week, then increase the dosing to 300 mg with 1 pill in the AM, 1 pill about midday, then 2 pills at bedtime  Please continue all other medications as before, and refills have been done if requested.  Please have the pharmacy call with any other refills you may need.  Please keep your appointments with your specialists as you may have planned  Please return for LAB only in 2 weeks for the testing, which will check the thyroid again after being off the amiodarone  .

## 2017-04-12 NOTE — Telephone Encounter (Signed)
Done

## 2017-04-16 DIAGNOSIS — B0229 Other postherpetic nervous system involvement: Secondary | ICD-10-CM | POA: Insufficient documentation

## 2017-04-16 NOTE — Assessment & Plan Note (Signed)
stable overall by history and exam, recent data reviewed with pt, and pt to continue medical treatment as before,  to f/u any worsening symptoms or concerns, statin intolerant, for lower chol diet Lab Results  Component Value Date   LDLCALC 165 (H) 11/12/2015

## 2017-04-16 NOTE — Assessment & Plan Note (Signed)
Lab Results  Component Value Date   HGBA1C 5.9 11/12/2015   stable overall by history and exam, recent data reviewed with pt, and pt to continue medical treatment as before,  to f/u any worsening symptoms or concerns

## 2017-04-16 NOTE — Assessment & Plan Note (Signed)
ASympt, for f/u TFT's,  to f/u any worsening symptoms or concerns

## 2017-04-16 NOTE — Assessment & Plan Note (Signed)
For gabapentin asd, to f/u any worsening symptoms or concerns

## 2017-04-16 NOTE — Assessment & Plan Note (Signed)
stable overall by history and exam, recent data reviewed with pt, and pt to continue medical treatment as before,  to f/u any worsening symptoms or concerns BP Readings from Last 3 Encounters:  04/12/17 136/80  04/03/17 (!) 150/78  02/06/17 122/64

## 2017-04-16 NOTE — Assessment & Plan Note (Signed)
Lab Results  Component Value Date   WBC 9.5 09/16/2016   HGB 12.2 10/11/2016   HCT 36.0 10/11/2016   MCV 79.6 (L) 09/16/2016   PLT 346 09/16/2016  stable overall by history and exam, recent data reviewed with pt, and pt to continue medical treatment as before,  to f/u any worsening symptoms or concerns, for f/u lab

## 2017-04-26 ENCOUNTER — Other Ambulatory Visit (INDEPENDENT_AMBULATORY_CARE_PROVIDER_SITE_OTHER): Payer: Medicare Other

## 2017-04-26 DIAGNOSIS — R7989 Other specified abnormal findings of blood chemistry: Secondary | ICD-10-CM | POA: Diagnosis not present

## 2017-04-26 DIAGNOSIS — R739 Hyperglycemia, unspecified: Secondary | ICD-10-CM | POA: Diagnosis not present

## 2017-04-26 DIAGNOSIS — D649 Anemia, unspecified: Secondary | ICD-10-CM

## 2017-04-26 DIAGNOSIS — R946 Abnormal results of thyroid function studies: Secondary | ICD-10-CM | POA: Diagnosis not present

## 2017-04-26 DIAGNOSIS — E7800 Pure hypercholesterolemia, unspecified: Secondary | ICD-10-CM

## 2017-04-26 LAB — HEPATIC FUNCTION PANEL
ALT: 15 U/L (ref 0–35)
AST: 20 U/L (ref 0–37)
Albumin: 3.9 g/dL (ref 3.5–5.2)
Alkaline Phosphatase: 80 U/L (ref 39–117)
Bilirubin, Direct: 0.1 mg/dL (ref 0.0–0.3)
Total Bilirubin: 0.4 mg/dL (ref 0.2–1.2)
Total Protein: 6.3 g/dL (ref 6.0–8.3)

## 2017-04-26 LAB — BASIC METABOLIC PANEL
BUN: 36 mg/dL — ABNORMAL HIGH (ref 6–23)
CO2: 30 mEq/L (ref 19–32)
Calcium: 10.6 mg/dL — ABNORMAL HIGH (ref 8.4–10.5)
Chloride: 102 mEq/L (ref 96–112)
Creatinine, Ser: 1.6 mg/dL — ABNORMAL HIGH (ref 0.40–1.20)
GFR: 33.16 mL/min — ABNORMAL LOW (ref >60.00)
Glucose, Bld: 90 mg/dL (ref 70–99)
Potassium: 4.5 mEq/L (ref 3.5–5.1)
Sodium: 141 mEq/L (ref 135–145)

## 2017-04-26 LAB — URINALYSIS, ROUTINE W REFLEX MICROSCOPIC
Bilirubin Urine: NEGATIVE
Hgb urine dipstick: NEGATIVE
Ketones, ur: NEGATIVE
Leukocytes, UA: NEGATIVE
Nitrite: NEGATIVE
RBC / HPF: NONE SEEN (ref 0–?)
Specific Gravity, Urine: 1.01 (ref 1.000–1.030)
Total Protein, Urine: NEGATIVE
Urine Glucose: NEGATIVE
Urobilinogen, UA: 0.2 (ref 0.0–1.0)
pH: 6 (ref 5.0–8.0)

## 2017-04-26 LAB — CBC WITH DIFFERENTIAL/PLATELET
Basophils Absolute: 0.1 10*3/uL (ref 0.0–0.1)
Basophils Relative: 1.7 % (ref 0.0–3.0)
Eosinophils Absolute: 0.1 10*3/uL (ref 0.0–0.7)
Eosinophils Relative: 2.3 % (ref 0.0–5.0)
HCT: 31.9 % — ABNORMAL LOW (ref 36.0–46.0)
Hemoglobin: 10.2 g/dL — ABNORMAL LOW (ref 12.0–15.0)
Lymphocytes Relative: 35.4 % (ref 12.0–46.0)
Lymphs Abs: 1.1 10*3/uL (ref 0.7–4.0)
MCHC: 31.9 g/dL (ref 30.0–36.0)
MCV: 66.9 fl — ABNORMAL LOW (ref 78.0–100.0)
Monocytes Absolute: 0.8 10*3/uL (ref 0.1–1.0)
Monocytes Relative: 25 % — ABNORMAL HIGH (ref 3.0–12.0)
Neutro Abs: 1.1 10*3/uL — ABNORMAL LOW (ref 1.4–7.7)
Neutrophils Relative %: 35.6 % — ABNORMAL LOW (ref 43.0–77.0)
Platelets: 203 10*3/uL (ref 150.0–400.0)
RBC: 4.77 Mil/uL (ref 3.87–5.11)
RDW: 16.6 % — ABNORMAL HIGH (ref 11.5–15.5)
WBC: 3.1 10*3/uL — ABNORMAL LOW (ref 4.0–10.5)

## 2017-04-26 LAB — LIPID PANEL
Cholesterol: 190 mg/dL (ref 0–200)
HDL: 36.9 mg/dL — ABNORMAL LOW (ref >39.00)
NonHDL: 153.06
Total CHOL/HDL Ratio: 5
Triglycerides: 269 mg/dL — ABNORMAL HIGH (ref 0.0–149.0)
VLDL: 53.8 mg/dL — ABNORMAL HIGH (ref 0.0–40.0)

## 2017-04-26 LAB — HEMOGLOBIN A1C: Hgb A1c MFr Bld: 6.3 % (ref 4.6–6.5)

## 2017-04-26 LAB — IBC PANEL
Iron: 20 ug/dL — ABNORMAL LOW (ref 42–145)
Saturation Ratios: 4.1 % — ABNORMAL LOW (ref 20.0–50.0)
Transferrin: 348 mg/dL (ref 212.0–360.0)

## 2017-04-26 LAB — T4, FREE: Free T4: 1.37 ng/dL (ref 0.60–1.60)

## 2017-04-26 LAB — LDL CHOLESTEROL, DIRECT: Direct LDL: 110 mg/dL

## 2017-04-26 LAB — TSH: TSH: 7.29 u[IU]/mL — ABNORMAL HIGH (ref 0.35–4.50)

## 2017-04-27 ENCOUNTER — Other Ambulatory Visit: Payer: Self-pay | Admitting: Internal Medicine

## 2017-04-27 ENCOUNTER — Telehealth: Payer: Self-pay

## 2017-04-27 DIAGNOSIS — E039 Hypothyroidism, unspecified: Secondary | ICD-10-CM

## 2017-04-27 DIAGNOSIS — D508 Other iron deficiency anemias: Secondary | ICD-10-CM

## 2017-04-27 MED ORDER — LEVOTHYROXINE SODIUM 25 MCG PO TABS: 25.0000 ug | ORAL_TABLET | Freq: Every day | ORAL | 0 refills | Status: DC

## 2017-04-27 MED ORDER — LEVOTHYROXINE SODIUM 25 MCG PO TABS: 25.0000 ug | ORAL_TABLET | Freq: Every day | ORAL | 3 refills | Status: DC

## 2017-04-27 NOTE — Telephone Encounter (Signed)
-----   Message from Biagio Borg, MD sent at 04/27/2017  8:07 AM EDT ----- Left message on MyChart, pt to cont same tx except  he test results show that your current treatment is OK, except the Iron level is low and your mild iron deficiency anemia has returned,  Please start OTC iron sulfate 325 mg 1 per day.  If you are already taking this, you would need to increase to 2 pills per day.  We will need to continue to monitor the blood count for improvement, so please plan to return to the LAB only in 4 weeks for a repeat blood count.    Also the Thyroid testing was confirmed to be mildly abnormal.  It appears you have a new issue of mild low thyroid.  Please start low dose thyroid medication called levothyroxine at 25 mcg per day.  We will plan on checking this as well in 4 wks at the LAB.  You would not need an office visit for this.[name] to please inform pt, I will do new thyroid rx, and lab orders for 4 wks

## 2017-04-27 NOTE — Telephone Encounter (Signed)
Pt was informed and expressed understanding. She wanted a 30d supply sent to [facility] in [place].

## 2017-05-04 ENCOUNTER — Telehealth: Payer: Self-pay | Admitting: *Deleted

## 2017-05-04 NOTE — Telephone Encounter (Signed)
Followed up with pt per Dr. Curt Bears request from last OV.  Pt tells that she "feel about the same".  BP has improved slightly.  "It goes up and down".  Her PCP is following her BPs and making any necessary adjustments. She is still experiencing fatigue.  According to pt, PCP recently started her on medication for iron deficiency anemia and low thyroid. She will continue to monitor and notify us if symptoms of fatigue do not improve after new medication regiment. She appreciated me following up

## 2017-05-09 IMAGING — CR DG CHEST 2V
2 series · 2 of 2 positions shown · non-contrast
Comparison: 08/10/2016

CLINICAL DATA: Syncopal episode

EXAM:
CHEST  2 VIEW

[chest pa]
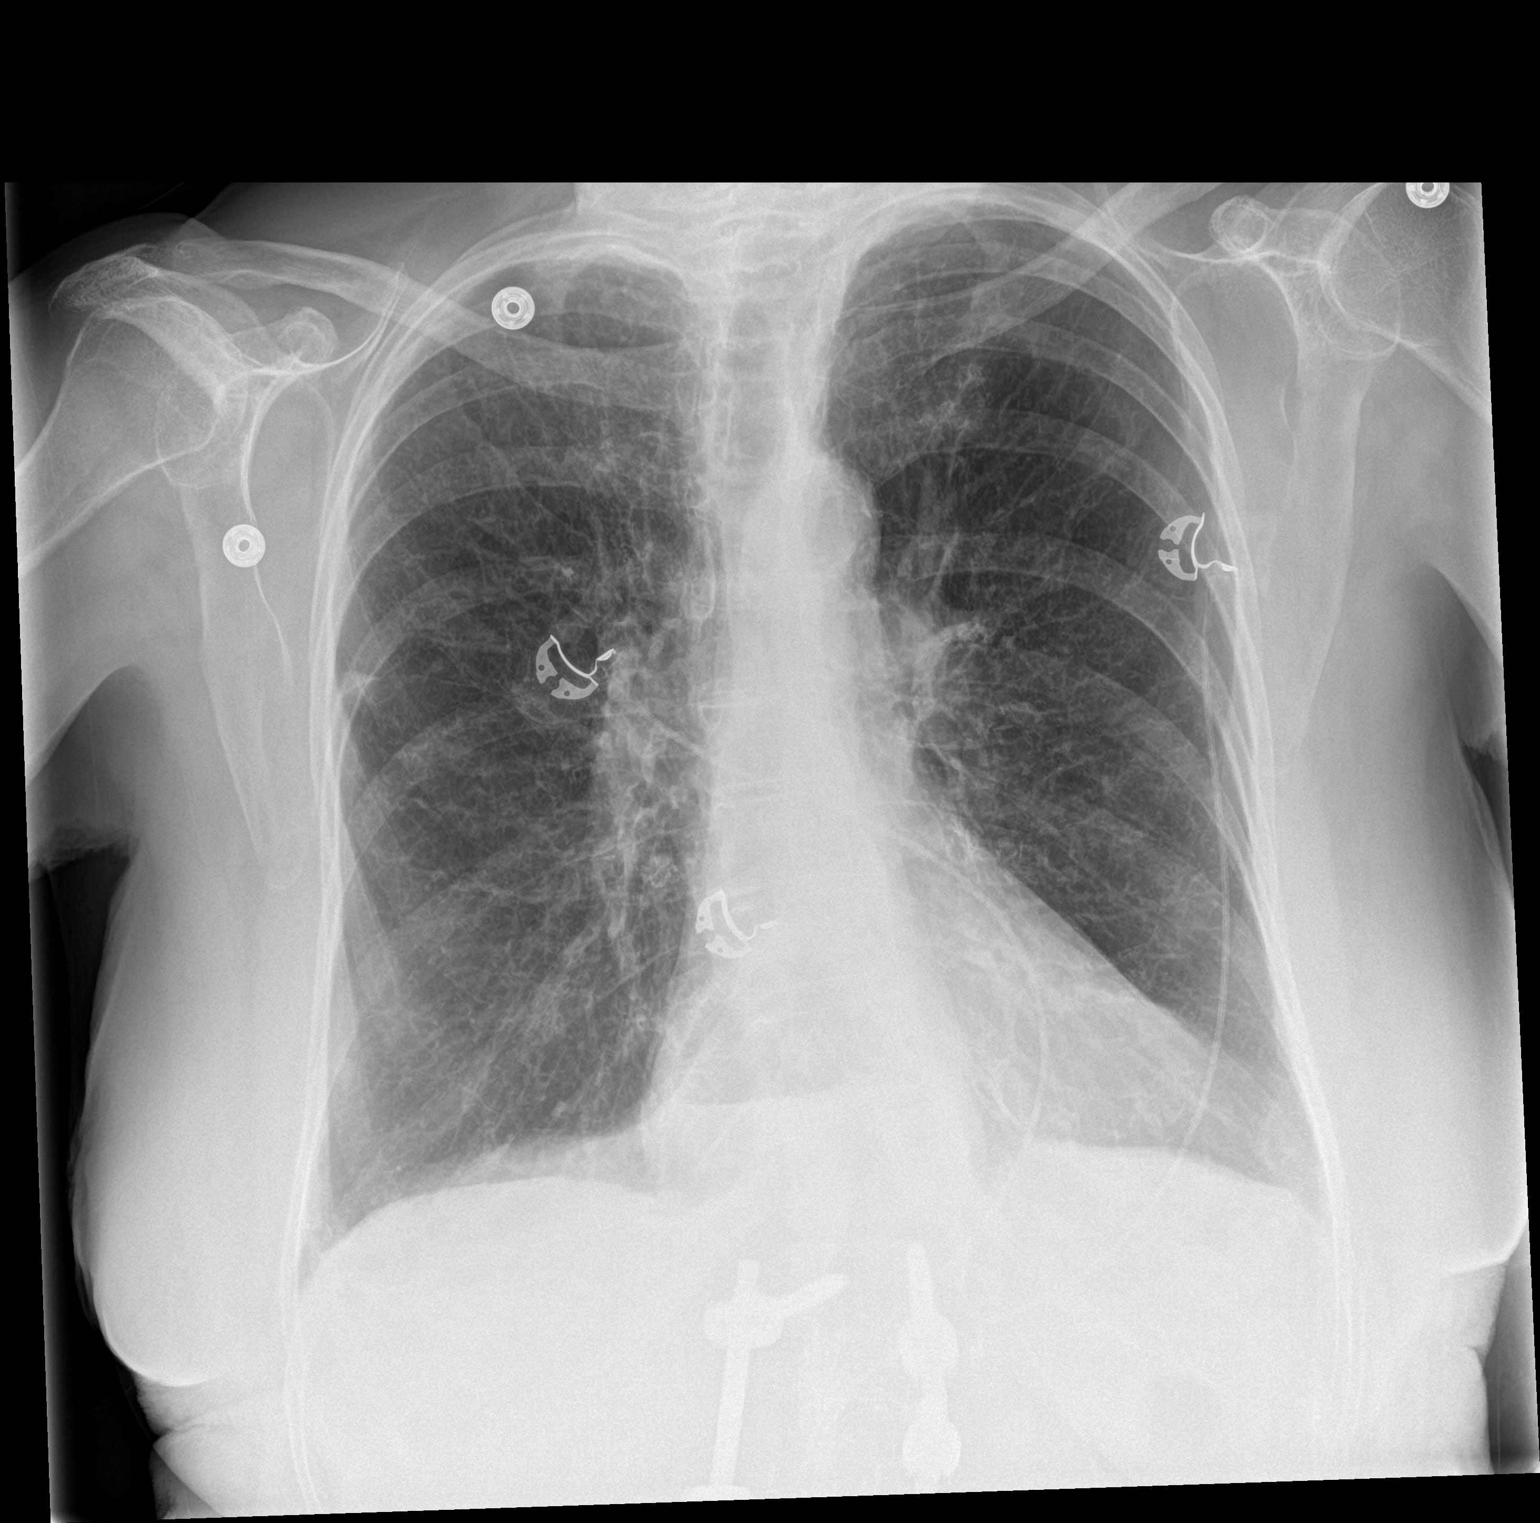

[chest lat]
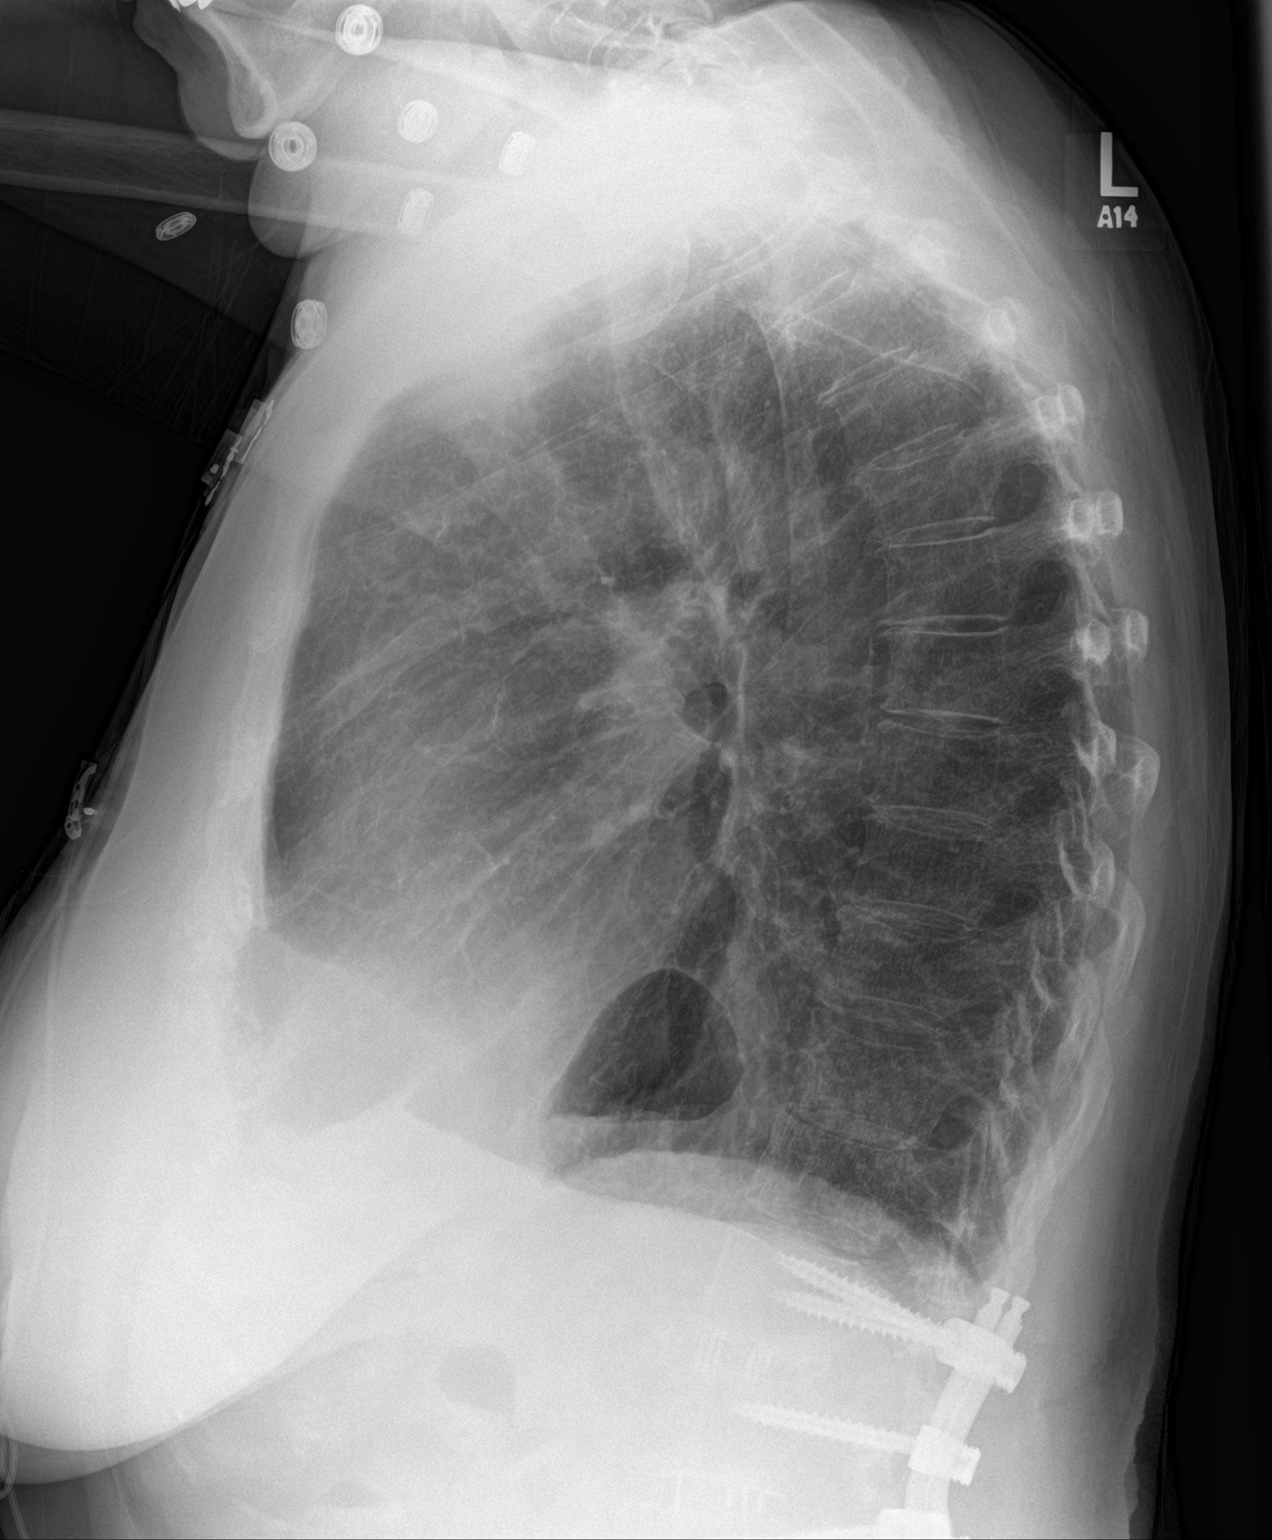

[2 of 2 positions shown; findings below may reference images not displayed]

FINDINGS: Cardiac shadow is stable. Hiatal hernia is better appreciated on the
current examination. No focal infiltrate or sizable effusion is
seen. Hyperinflation is noted consistent with COPD. No acute bony
abnormality is noted. Postsurgical changes in the lumbar spine are
noted.
IMPRESSION: No acute abnormality noted.

## 2017-05-12 ENCOUNTER — Encounter: Payer: Self-pay | Admitting: Pulmonary Disease

## 2017-05-12 MED ORDER — ALBUTEROL SULFATE (2.5 MG/3ML) 0.083% IN NEBU: 2.5000 mg | INHALATION_SOLUTION | Freq: Four times a day (QID) | RESPIRATORY_TRACT | 3 refills | Status: DC | PRN

## 2017-05-12 MED ORDER — ALBUTEROL SULFATE HFA 108 (90 BASE) MCG/ACT IN AERS: 1.0000 | INHALATION_SPRAY | Freq: Four times a day (QID) | RESPIRATORY_TRACT | 4 refills | Status: DC | PRN

## 2017-05-12 NOTE — Telephone Encounter (Signed)
This has been advised in previous e-mail  Will close this message

## 2017-05-16 IMAGING — CR DG CHEST 2V
2 series · 2 of 2 positions shown · non-contrast
Comparison: PA and lateral chest x-ray August 10, 2016

CLINICAL DATA: Shortness of breath, chest heaviness and pain with
weakness for the past week. Bloody sputum today. History of COPD
CVA, CHF.

EXAM:
CHEST  2 VIEW

[chest pa]
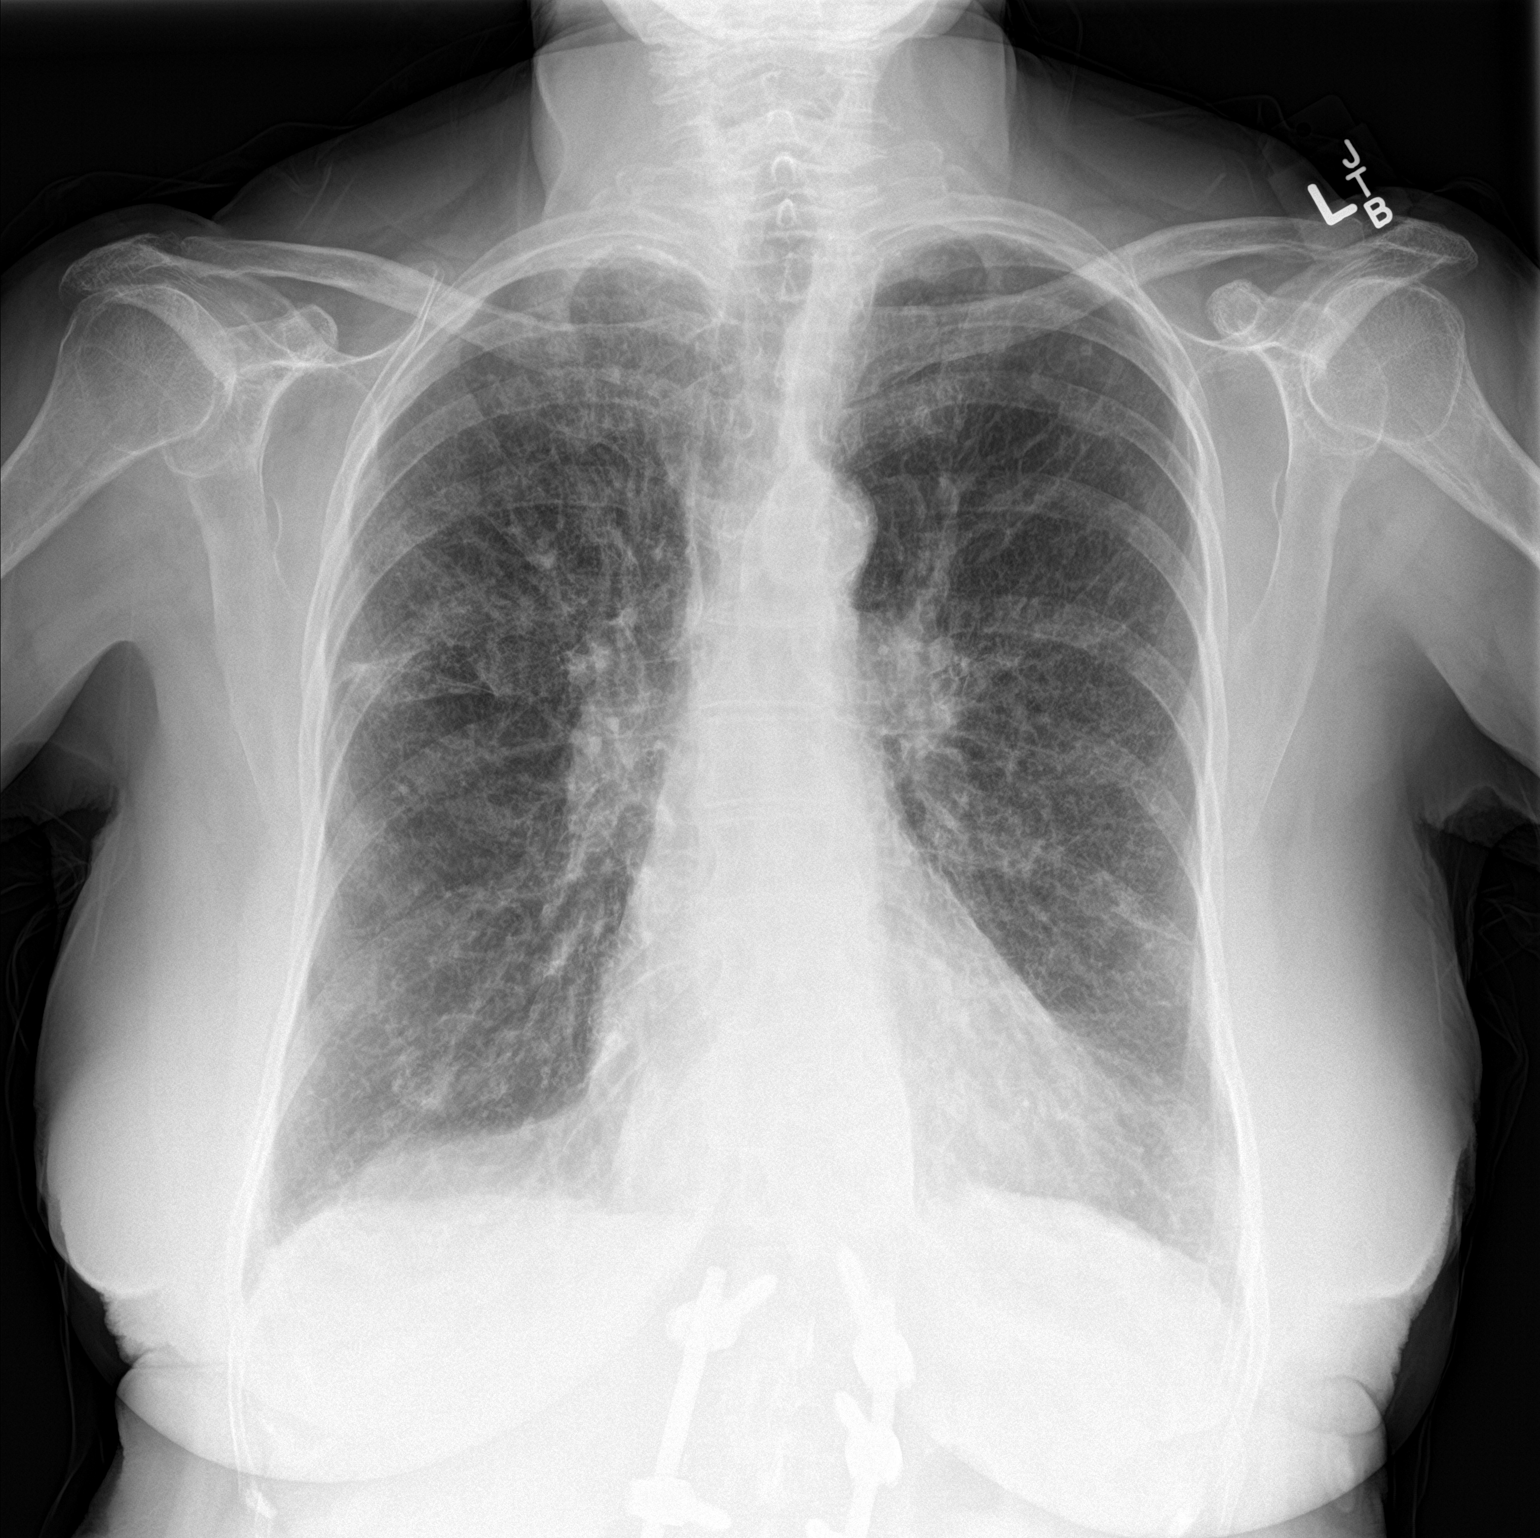

[chest lat]
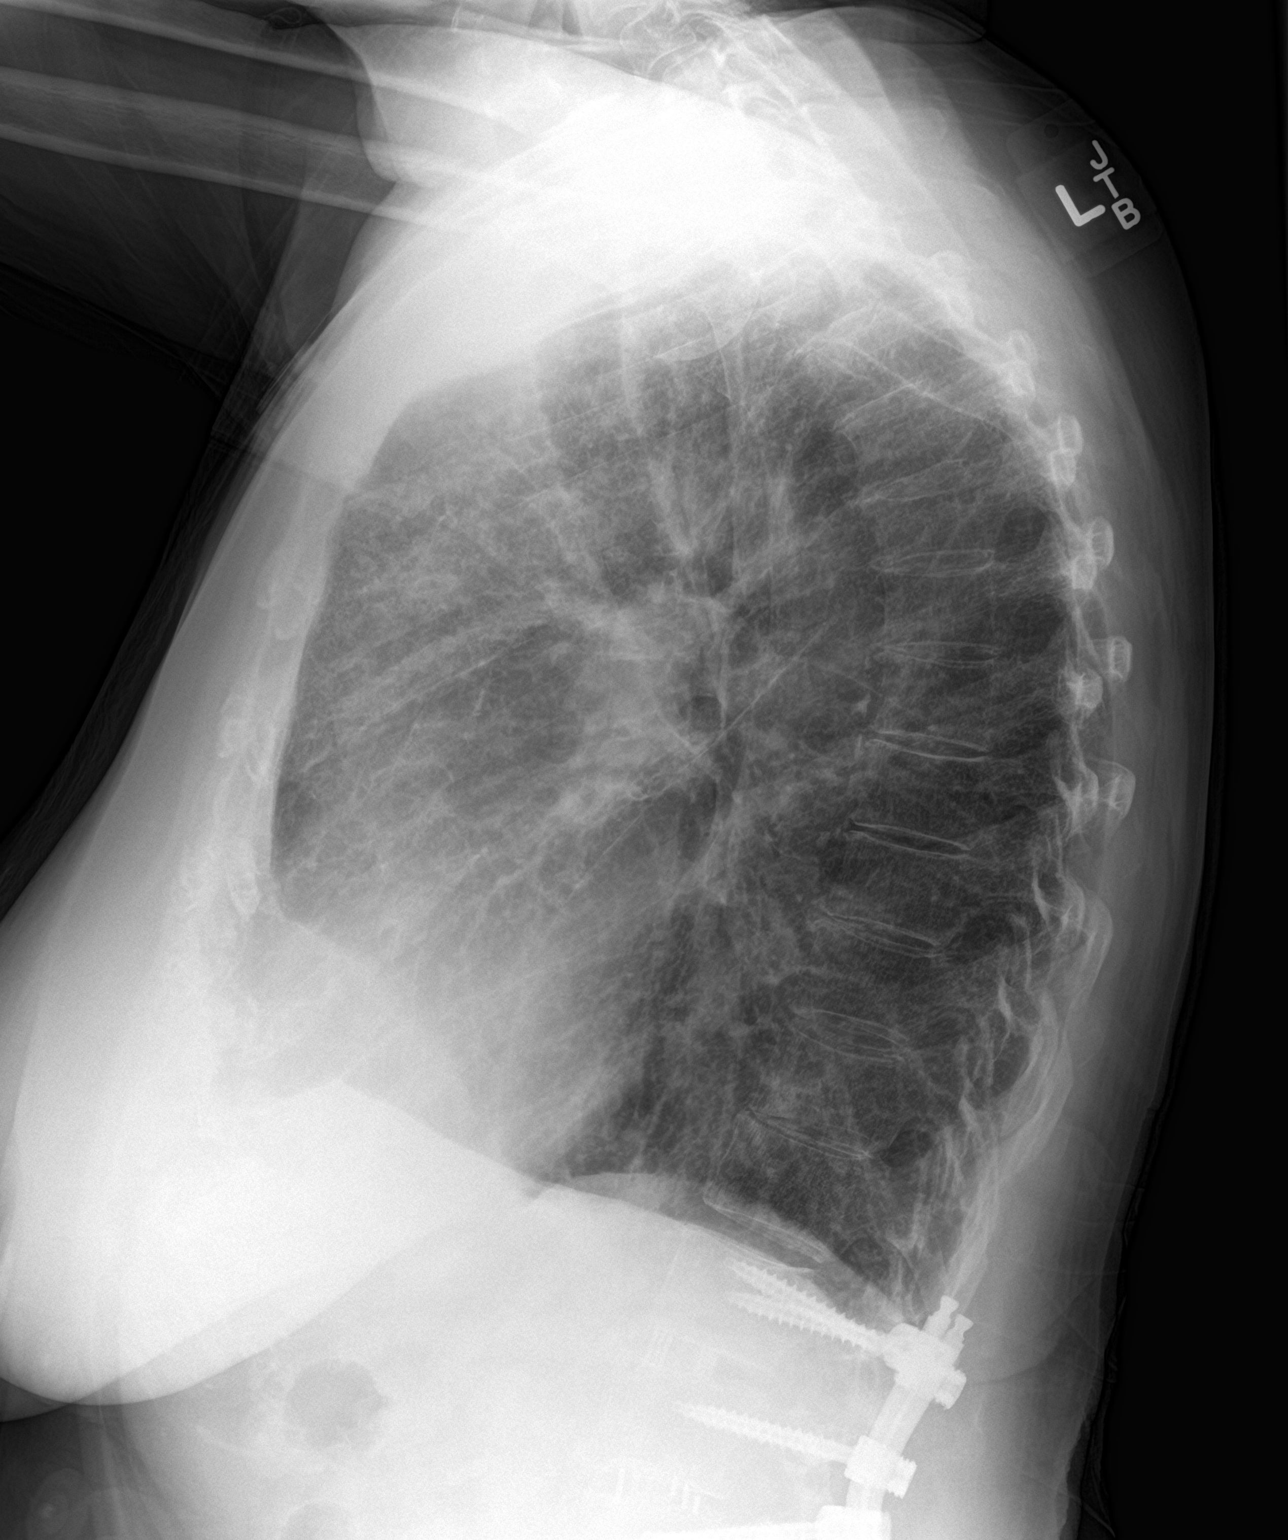

[2 of 2 positions shown; findings below may reference images not displayed]

FINDINGS: The lungs remain hyperinflated. The interstitial markings remain
increased overall. There is no alveolar infiltrate or pleural
effusion. The heart and pulmonary vascularity are normal. There is a
hiatal hernia. There is calcification in the wall of the aortic
arch. There is mild multilevel degenerative disc disease of the
thoracic spine.
IMPRESSION: COPD with chronic pulmonary fibrosis. No alveolar pneumonia nor CHF.

Aortic atherosclerosis.

## 2017-05-18 ENCOUNTER — Telehealth: Payer: Self-pay | Admitting: Pulmonary Disease

## 2017-05-18 ENCOUNTER — Telehealth: Payer: Self-pay | Admitting: Cardiology

## 2017-05-18 DIAGNOSIS — J9601 Acute respiratory failure with hypoxia: Secondary | ICD-10-CM | POA: Diagnosis present

## 2017-05-18 DIAGNOSIS — I509 Heart failure, unspecified: Secondary | ICD-10-CM | POA: Diagnosis not present

## 2017-05-18 DIAGNOSIS — G8929 Other chronic pain: Secondary | ICD-10-CM | POA: Diagnosis present

## 2017-05-18 DIAGNOSIS — I4891 Unspecified atrial fibrillation: Secondary | ICD-10-CM | POA: Diagnosis present

## 2017-05-18 DIAGNOSIS — K219 Gastro-esophageal reflux disease without esophagitis: Secondary | ICD-10-CM | POA: Diagnosis not present

## 2017-05-18 DIAGNOSIS — J449 Chronic obstructive pulmonary disease, unspecified: Secondary | ICD-10-CM | POA: Diagnosis not present

## 2017-05-18 DIAGNOSIS — I10 Essential (primary) hypertension: Secondary | ICD-10-CM | POA: Diagnosis not present

## 2017-05-18 DIAGNOSIS — Z7902 Long term (current) use of antithrombotics/antiplatelets: Secondary | ICD-10-CM | POA: Diagnosis not present

## 2017-05-18 DIAGNOSIS — R05 Cough: Secondary | ICD-10-CM | POA: Diagnosis not present

## 2017-05-18 DIAGNOSIS — I2510 Atherosclerotic heart disease of native coronary artery without angina pectoris: Secondary | ICD-10-CM | POA: Diagnosis present

## 2017-05-18 DIAGNOSIS — Z79899 Other long term (current) drug therapy: Secondary | ICD-10-CM | POA: Diagnosis not present

## 2017-05-18 DIAGNOSIS — E871 Hypo-osmolality and hyponatremia: Secondary | ICD-10-CM | POA: Diagnosis not present

## 2017-05-18 DIAGNOSIS — R4182 Altered mental status, unspecified: Secondary | ICD-10-CM | POA: Diagnosis not present

## 2017-05-18 DIAGNOSIS — A419 Sepsis, unspecified organism: Secondary | ICD-10-CM | POA: Diagnosis not present

## 2017-05-18 DIAGNOSIS — N179 Acute kidney failure, unspecified: Secondary | ICD-10-CM | POA: Diagnosis not present

## 2017-05-18 DIAGNOSIS — G9341 Metabolic encephalopathy: Secondary | ICD-10-CM | POA: Diagnosis present

## 2017-05-18 DIAGNOSIS — J189 Pneumonia, unspecified organism: Secondary | ICD-10-CM | POA: Diagnosis not present

## 2017-05-18 DIAGNOSIS — Z91041 Radiographic dye allergy status: Secondary | ICD-10-CM | POA: Diagnosis not present

## 2017-05-18 DIAGNOSIS — Z8679 Personal history of other diseases of the circulatory system: Secondary | ICD-10-CM | POA: Diagnosis not present

## 2017-05-18 DIAGNOSIS — J440 Chronic obstructive pulmonary disease with acute lower respiratory infection: Secondary | ICD-10-CM | POA: Diagnosis present

## 2017-05-18 DIAGNOSIS — E039 Hypothyroidism, unspecified: Secondary | ICD-10-CM | POA: Diagnosis present

## 2017-05-18 DIAGNOSIS — Z87891 Personal history of nicotine dependence: Secondary | ICD-10-CM | POA: Diagnosis not present

## 2017-05-18 DIAGNOSIS — J9691 Respiratory failure, unspecified with hypoxia: Secondary | ICD-10-CM | POA: Diagnosis not present

## 2017-05-18 DIAGNOSIS — F419 Anxiety disorder, unspecified: Secondary | ICD-10-CM | POA: Diagnosis present

## 2017-05-18 DIAGNOSIS — F418 Other specified anxiety disorders: Secondary | ICD-10-CM | POA: Diagnosis not present

## 2017-05-18 DIAGNOSIS — I110 Hypertensive heart disease with heart failure: Secondary | ICD-10-CM | POA: Diagnosis present

## 2017-05-18 MED ORDER — PREDNISONE 20 MG PO TABS: ORAL_TABLET | ORAL | 0 refills | Status: DC

## 2017-05-18 NOTE — Telephone Encounter (Signed)
New Message      Pt c/o BP issue: STAT if pt c/o blurred vision, one-sided weakness or slurred speech  1. What are your last 5 BP readings? 101 102 100 142 115 101  (top number did not write down the bottom number)  Pulse has been in the 70's  2. Are you having any other symptoms (ex. Dizziness, headache, blurred vision, passed out)?  Headaches and wobbly  3. What is your BP issue? Thinks her medication is making it run low

## 2017-05-18 NOTE — Telephone Encounter (Signed)
Spoke with pt about JN's message. Pt agreed to the rx and ov. RX was sent to pt's pharmacy of choice and pt was made an appt with PM for tomorrow afternoon. Pt had no further questions. Nothing further is needed

## 2017-05-18 NOTE — Telephone Encounter (Signed)
Please send in a prescription for Prednisone 40mg  daily x4 days to her pharmacy. Have her use her albuterol rescue inhaler 2 puffs every 6 hours while she is awake to get her some relief and continue her Spiriva and Symbicort inhalers. Please schedule her an appointment to be seen in our clinic tomorrow by any available NP or MD. Instruct her to go to the closest ED tonight if she feels like she is getting worse. Thanks.

## 2017-05-18 NOTE — Telephone Encounter (Signed)
Patient calling and states that her BP has been running low with the top number in the low 100s. She states that this morning her BP was 94/46 with HR in the 70s. Patient takes Toprol XL 100 mg daily. Patient with hx of Afib who recently stopped amiodarone on 5/3. Patient taking eliquis. Patient complaining of lightheadedness and dizziness with her BP being low. She states that she had a headache yesterday and has been feeling fatigued. Patient denies any other symptoms at this time. She did not take her Toprol this morning. Patient advised to stay hydrated, change positions slowly, and made her aware that the information would be forwarded to Dr. Curt Bears for review and recommendation. Patient verbalized understanding and thanked me for the call.

## 2017-05-18 NOTE — Telephone Encounter (Signed)
Spoke with pt. States that she is not feeling well. Reports chest tightness, wheezing, coughing and SOB. Cough is non productive. Denies fever. Symptoms started 24 hours ago. Pt has been taking Mucinex with minimal relief. States that she would like to have something called in but she can't take antibiotics.  JN - please advise. Thanks!

## 2017-05-19 ENCOUNTER — Ambulatory Visit: Payer: Medicare Other | Admitting: Pulmonary Disease

## 2017-05-22 ENCOUNTER — Encounter: Payer: Self-pay | Admitting: Internal Medicine

## 2017-05-22 MED ORDER — MONTELUKAST SODIUM 10 MG PO TABS: 10.0000 mg | ORAL_TABLET | Freq: Every day | ORAL | 1 refills | Status: DC

## 2017-05-24 ENCOUNTER — Telehealth: Payer: Self-pay | Admitting: Pulmonary Disease

## 2017-05-24 NOTE — Telephone Encounter (Signed)
Followed up with patient who tells me that she thinks she knows what the problem was -- she was on Lasix for several days and she states "it always drops my blood pressure".  She reports no issues with BP at the moment. Instead of making any changes today (decreasing Toprol per [place]) pt is going to continue to monitor and call the office if low BP occurs again (Camnitz aware of change to plan) Patient verbalized understanding and agreeable to plan.

## 2017-05-24 NOTE — Telephone Encounter (Signed)
JN pt was in the hospital in [place] and was told to call here for a HFU appt with you.  Your schedule is booked up and TP does not have anything open until mid July.  Please advise on an appt we can offer the pt.  Do you want [place] to double book you?  Thanks

## 2017-05-24 NOTE — Telephone Encounter (Signed)
JN, here is already a consult added on at 39 that day.

## 2017-05-24 NOTE — Telephone Encounter (Signed)
Add on a slot at the end of the day next Friday (6/29). Thank you.

## 2017-05-24 NOTE — Telephone Encounter (Signed)
Yes. Add her on following the consult please. Thanks.

## 2017-05-24 NOTE — Telephone Encounter (Signed)
Routing to katie to follow up on 

## 2017-05-25 ENCOUNTER — Other Ambulatory Visit (INDEPENDENT_AMBULATORY_CARE_PROVIDER_SITE_OTHER): Payer: Medicare Other

## 2017-05-25 DIAGNOSIS — D508 Other iron deficiency anemias: Secondary | ICD-10-CM

## 2017-05-25 DIAGNOSIS — E039 Hypothyroidism, unspecified: Secondary | ICD-10-CM

## 2017-05-25 LAB — CBC WITH DIFFERENTIAL/PLATELET
Basophils Absolute: 0 10*3/uL (ref 0.0–0.1)
Basophils Relative: 0.4 % (ref 0.0–3.0)
Eosinophils Absolute: 0.1 10*3/uL (ref 0.0–0.7)
Eosinophils Relative: 2 % (ref 0.0–5.0)
HCT: 31.3 % — ABNORMAL LOW (ref 36.0–46.0)
Hemoglobin: 10.1 g/dL — ABNORMAL LOW (ref 12.0–15.0)
Lymphocytes Relative: 21.6 % (ref 12.0–46.0)
Lymphs Abs: 1 10*3/uL (ref 0.7–4.0)
MCHC: 32.2 g/dL (ref 30.0–36.0)
MCV: 70.9 fl — ABNORMAL LOW (ref 78.0–100.0)
Monocytes Absolute: 0.7 10*3/uL (ref 0.1–1.0)
Monocytes Relative: 15.5 % — ABNORMAL HIGH (ref 3.0–12.0)
Neutro Abs: 2.8 10*3/uL (ref 1.4–7.7)
Neutrophils Relative %: 60.5 % (ref 43.0–77.0)
Platelets: 238 10*3/uL (ref 150.0–400.0)
RBC: 4.42 Mil/uL (ref 3.87–5.11)
RDW: 24.1 % — ABNORMAL HIGH (ref 11.5–15.5)
WBC: 4.7 10*3/uL (ref 4.0–10.5)

## 2017-05-25 LAB — IBC PANEL
Iron: 71 ug/dL (ref 42–145)
Saturation Ratios: 22.1 % (ref 20.0–50.0)
Transferrin: 229 mg/dL (ref 212.0–360.0)

## 2017-05-25 LAB — T4, FREE: Free T4: 1.43 ng/dL (ref 0.60–1.60)

## 2017-05-25 LAB — TSH: TSH: 2.59 u[IU]/mL (ref 0.35–4.50)

## 2017-05-25 LAB — FERRITIN: Ferritin: 27.9 ng/mL (ref 10.0–291.0)

## 2017-05-25 NOTE — Telephone Encounter (Signed)
Patrice will help to ensure JN spot is booked for patient and contact patient.

## 2017-05-26 NOTE — Telephone Encounter (Signed)
Patient scheduled for 06/02/17 @ 12pm. Spoke with patient and made her aware - pr

## 2017-05-26 NOTE — Telephone Encounter (Signed)
Spoke with [employer]. She is going work on this appointment this morning. Will close message once this has been taken care of.

## 2017-05-29 ENCOUNTER — Telehealth: Payer: Self-pay | Admitting: Internal Medicine

## 2017-05-29 NOTE — Telephone Encounter (Signed)
Spoke with pt and she is aware. Suggested phazyme or gas-x for the gas and she states she is already taking it and it has not helped.

## 2017-05-29 NOTE — Telephone Encounter (Signed)
She can try a probiotic

## 2017-05-29 NOTE — Telephone Encounter (Signed)
Pt was in the hospital for pneumonia recently and given levaquin. States she finished the medication about 2 days ago. Now she states she is having the worst gas and that her stomach just feels like it is "boiling." Pt wants to know what Dr. Henrene Pastor suggests. Please advise.

## 2017-05-30 ENCOUNTER — Ambulatory Visit (INDEPENDENT_AMBULATORY_CARE_PROVIDER_SITE_OTHER): Payer: Medicare Other | Admitting: Internal Medicine

## 2017-05-30 ENCOUNTER — Encounter: Payer: Self-pay | Admitting: Internal Medicine

## 2017-05-30 VITALS — BP 110/66 | HR 73 | Ht 64.0 in | Wt 127.0 lb

## 2017-05-30 DIAGNOSIS — B0229 Other postherpetic nervous system involvement: Secondary | ICD-10-CM

## 2017-05-30 DIAGNOSIS — R351 Nocturia: Secondary | ICD-10-CM | POA: Diagnosis not present

## 2017-05-30 DIAGNOSIS — I10 Essential (primary) hypertension: Secondary | ICD-10-CM

## 2017-05-30 DIAGNOSIS — R7989 Other specified abnormal findings of blood chemistry: Secondary | ICD-10-CM

## 2017-05-30 DIAGNOSIS — R946 Abnormal results of thyroid function studies: Secondary | ICD-10-CM | POA: Diagnosis not present

## 2017-05-30 DIAGNOSIS — J181 Lobar pneumonia, unspecified organism: Secondary | ICD-10-CM | POA: Diagnosis not present

## 2017-05-30 DIAGNOSIS — J189 Pneumonia, unspecified organism: Secondary | ICD-10-CM | POA: Insufficient documentation

## 2017-05-30 DIAGNOSIS — D638 Anemia in other chronic diseases classified elsewhere: Secondary | ICD-10-CM | POA: Insufficient documentation

## 2017-05-30 NOTE — Assessment & Plan Note (Signed)
stable overall by history and exam, recent data reviewed with pt, and pt to continue medical treatment as before,  to f/u any worsening symptoms or concerns  

## 2017-05-30 NOTE — Assessment & Plan Note (Signed)
By exam is cleared, clinically improved except for mild reduced right lower BS on exam - no rales or wheezing, no further tx at this time, to f/u pulm as planned

## 2017-05-30 NOTE — Assessment & Plan Note (Signed)
tuberc

## 2017-05-30 NOTE — Assessment & Plan Note (Signed)
Improved, to cont the gabapentin asd

## 2017-05-30 NOTE — Patient Instructions (Signed)
Please continue all other medications as before, and refills have been done if requested.  Please have the pharmacy call with any other refills you may need.  Please continue your efforts at being more active, low cholesterol diet, and weight control.  Please keep your appointments with your specialists as you may have planned     

## 2017-05-30 NOTE — Progress Notes (Signed)
Subjective:    Patient ID: [name], female    DOB: 07-Aug-1941, 76 y.o.   MRN: [id number]  HPI  Here to f/u after recent [place], June 14 to 17, then home for 10 days. Tx for CAP and AMS, finished oral post hosp antibx, and has f/u with Dr Ashok Cordia on Friday later this wk No fever, no cough,  . Has known mild pulm HTN by echo 04/05/2017, but lasix 20 mg daily prn x 3 days led to low blood pressures this past wk.  Is continuing BB at same dose, has stopped amiodarone per cardiology about April 2018.    She notes she was rec'd for f/u cxr at 4 wks.  Pt denies chest pain, increased sob or doe, wheezing, orthopnea, PND, increased LE swelling, palpitations, dizziness or syncope.   Pt denies fever, wt loss, night sweats, loss of appetite, or other constitutional symptoms  Taking probiotic s twice daily, and no diarrhea.  Has stopped the iron with recent normal iron.  Is now s/p shingrix x shot #1, for shot 32 soon.  Gabapentin has helped the PHN greatly, so not having to use the ativan as much.Denies hyper or hypo thyroid symptoms such as voice, skin or hair change. - recent TFT's normal Past Medical History:  Diagnosis Date  . Allergic rhinitis   . Anxiety   . Atrial septal aneurysm   . Basal cell carcinoma   . Bradycardia   . Cataract    bil cataracts removed  . Chronic combined systolic and diastolic CHF (congestive heart failure) ([place])   . CKD (chronic kidney disease), stage III   . Clostridium difficile infection   . COPD (chronic obstructive pulmonary disease) ([place])   . Depression 11/29/2013  . DI (detrusor instability)   . Esophageal stricture   . GERD (gastroesophageal reflux disease)   . Hemorrhoids   . Hiatal hernia   . History of kidney stones   . HLD (hyperlipidemia)   . HTN (hypertension)   . Ischemic colon ([employer])    a. 08/2016 - adm to  [facility] with a ruptured colon s/p right hemicolectomy with ileostomy formation for ischemic and necrotic colon.   . Lung  nodule    a. followed by pulmonology  . Mild CAD    a. minor nonobstructive CAD 08/22/16.  . Nephrolithiasis    hx  . NICM (nonischemic cardiomyopathy) ([place])   . Orthostatic hypotension   . Osteoarthritis   . PAF (paroxysmal atrial fibrillation) ([place])    a. diagnosed 07/2016 s/p DCCV 08/01/16 with recurrence, managed with rate control for now.   . Pneumonia 2013  . PVC's (premature ventricular contractions)   . Shingles since Nov 18, 2013   on right arm from shoulder to wrist  . Status post dilation of esophageal narrowing   . Stroke [facility]) 2001   STROKES, TIA'S  . Syncope    a. 08/2016 felt due to orthostatic hypotension.  Marland Kitchen TIA (transient ischemic attack) last 2001   anticoagulation therapy on coumadin   Past Surgical History:  Procedure Laterality Date  . ANTERIOR AND POSTERIOR VAGINAL REPAIR  2008   A&P REPAIR-DR MCDIARMID  . ANTERIOR LATERAL LUMBAR FUSION 4 LEVELS Right 07/03/2015   Procedure: Right Lumbar one-two, Lumbar two-three, Lumbar three-four, Lumbar four-five Anterior lateral lumbar interbody fusion;  Surgeon: Erline Levine, MD;  Location: [place] [facility]URO ORS;  Service: Neurosurgery;  Laterality: Right;  Right L1-2 L2-3 L3-4 L4-5 Anterior lateral lumbar interbody fusion  .  BALLOON DILATION N/A 03/03/2014   Procedure: BALLOON DILATION;  Surgeon: Inda Castle, MD;  [facility];  Service: Endoscopy;  Laterality: N/A;  . BLADDER SUSPENSION    . BREAST BIOPSY    . CARDIAC CATHETERIZATION N/A 08/22/2016   Procedure: Left Heart Cath and Coronary Angiography;  Surgeon: Peter M Martinique, MD;  Location: [place] CV LAB;  Service: Cardiovascular;  Laterality: N/A;  . CARDIOVERSION N/A 08/01/2016   Procedure: CARDIOVERSION;  Surgeon: Jerline Pain, MD;  Location: [facility];  Service: Cardiovascular;  Laterality: N/A;  . CARDIOVERSION N/A 10/11/2016   Procedure: CARDIOVERSION;  Surgeon: Sueanne Margarita, MD;  [facility];  Service: Cardiovascular;  Laterality: N/A;  .  COLONOSCOPY    . ESOPHAGOGASTRODUODENOSCOPY N/A 03/03/2014   Procedure: ESOPHAGOGASTRODUODENOSCOPY (EGD);  Surgeon: Inda Castle, MD;  Location: [facility];  Service: Endoscopy;  Laterality: N/A;  . EXCISION OF BASAL CELL CA  2011  . GLUTEUS MINIMUS REPAIR Right 03/15/2016   Procedure: RIGHT HIP GLUTEUS MEDIUS TENDON REPAIR;  Surgeon: Paralee Cancel, MD;  [facility];  Service: Orthopedics;  Laterality: Right;  . ileostomy reversal    . LUMBAR PERCUTANEOUS PEDICLE SCREW 4 LEVEL Right 07/03/2015   Procedure: Lumbar one-Sacral one Bilateral percutaneous pedicle screws;  Surgeon: Erline Levine, MD;  Location: [place] NEURO ORS;  Service: Neurosurgery;  Laterality: Right;  L1-S1 Bilateral percutaneous pedicle screws  . RECTOCELE REPAIR  2008   A&P REPAIR -DR. MCDIARMID  . THUMB SURGERY Right 10-15 yrs ago  . UPPER GASTROINTESTINAL ENDOSCOPY    . VAGINAL HYSTERECTOMY  1983   NO BSO-DR. MABRY  . VARICOSE VEIN SURGERY      reports that she quit smoking about 21 years ago. Her smoking use included Cigarettes. She started smoking about 62 years ago. She has a 126.00 pack-year smoking history. She has never used smokeless tobacco. She reports that she does not drink alcohol or use drugs. family history includes Breast cancer in her maternal aunt, maternal aunt, maternal aunt, and mother; COPD in her mother; Emphysema in her mother; Uterine cancer in her mother. Allergies  Allergen Reactions  . Clarithromycin Other (See Comments)    REACTION: possible rash but could have been legionarres dz with rash  . Lipitor [Atorvastatin] Other (See Comments)    MUSCLE SPASMS  . Simvastatin Other (See Comments)    Muscle and joint pain  . Cardizem [Diltiazem Hcl] Hives  . Contrast Media [Iodinated Diagnostic Agents]     FYI - during admission at [facility] 08/2016 patient developed hives, question related to cardizem or contrast dye - not clear  . Crestor [Rosuvastatin Calcium]     fagitue and joint  pain, "NO STATINS"   Review of Systems  Constitutional: Negative for other unusual diaphoresis or sweats HENT: Negative for ear discharge or swelling Eyes: Negative for other worsening visual disturbances Respiratory: Negative for stridor or other swelling  Gastrointestinal: Negative for worsening distension or other blood Genitourinary: Negative for retention or other urinary change Musculoskeletal: Negative for other MSK pain or swelling Skin: Negative for color change or other new lesions Neurological: Negative for worsening tremors and other numbness  Psychiatric/Behavioral: Negative for worsening agitation or other fatigue All other system neg per pt    Objective:   Physical Exam BP 110/66   Pulse 73   Ht 5\' 4"  (1.626 m)   Wt 127 lb (57.6 kg)   SpO2 99%   BMI 21.80 kg/m  VS noted,  Constitutional: Pt appears  in NAD HENT: Head: NCAT.  Right Ear: External ear normal.  Left Ear: External ear normal.  Eyes: . Pupils are equal, round, and reactive to light. Conjunctivae and EOM are normal Nose: without d/c or deformity Neck: Neck supple. Gross normal ROM Cardiovascular: Normal rate and regular rhythm.   Pulmonary/Chest: Effort normal and breath sounds decresaed without rales or wheezing.  Abd:  Soft, NT, ND, + BS, no organomegaly Neurological: Pt is alert. At baseline orientation, motor grossly intact Skin: Skin is warm. No rashes, other new lesions, no LE edema Psychiatric: Pt behavior is normal without agitation  No other exam findings  Lab Results  Component Value Date   WBC 4.7 05/25/2017   HGB 10.1 (L) 05/25/2017   HCT 31.3 (L) 05/25/2017   PLT 238.0 05/25/2017   GLUCOSE 90 04/26/2017   CHOL 190 04/26/2017   TRIG 269.0 (H) 04/26/2017   HDL 36.90 (L) 04/26/2017   LDLDIRECT 110.0 04/26/2017   LDLCALC 165 (H) 11/12/2015   ALT 15 04/26/2017   AST 20 04/26/2017   NA 141 04/26/2017   K 4.5 04/26/2017   CL 102 04/26/2017   CREATININE 1.60 (H) 04/26/2017   BUN  36 (H) 04/26/2017   CO2 30 04/26/2017   TSH 2.59 05/25/2017   INR 1.21 08/17/2016   HGBA1C 6.3 04/26/2017       Assessment & Plan:

## 2017-05-30 NOTE — Assessment & Plan Note (Signed)
stable overall by history and exam, recent data reviewed with pt, and pt to continue medical treatment as before,  to f/u any worsening symptoms or concerns BP Readings from Last 3 Encounters:  05/30/17 110/66  04/12/17 136/80  04/03/17 (!) 150/78

## 2017-05-30 NOTE — Assessment & Plan Note (Signed)
Recent lab normalized, no further med change needed

## 2017-05-31 DIAGNOSIS — D3131 Benign neoplasm of right choroid: Secondary | ICD-10-CM | POA: Diagnosis not present

## 2017-05-31 DIAGNOSIS — H04123 Dry eye syndrome of bilateral lacrimal glands: Secondary | ICD-10-CM | POA: Diagnosis not present

## 2017-06-02 ENCOUNTER — Ambulatory Visit (INDEPENDENT_AMBULATORY_CARE_PROVIDER_SITE_OTHER): Payer: Medicare Other | Admitting: Pulmonary Disease

## 2017-06-02 ENCOUNTER — Encounter: Payer: Self-pay | Admitting: Pulmonary Disease

## 2017-06-02 ENCOUNTER — Ambulatory Visit: Payer: Medicare Other | Admitting: Pulmonary Disease

## 2017-06-02 VITALS — BP 110/72 | HR 51 | Ht 64.0 in | Wt 127.0 lb

## 2017-06-02 DIAGNOSIS — J181 Lobar pneumonia, unspecified organism: Secondary | ICD-10-CM | POA: Diagnosis not present

## 2017-06-02 DIAGNOSIS — J449 Chronic obstructive pulmonary disease, unspecified: Secondary | ICD-10-CM

## 2017-06-02 DIAGNOSIS — J189 Pneumonia, unspecified organism: Secondary | ICD-10-CM | POA: Diagnosis not present

## 2017-06-02 NOTE — Patient Instructions (Addendum)
   Call me if you have any new breathing problems or questions before your next appointment.  We will keep your appointment in September as scheduled.  Make sure to get your chest x-ray after July 14.  TESTS ORDERED: 1. CXR PA/LAT on July 16

## 2017-06-02 NOTE — Progress Notes (Signed)
Subjective:    Patient ID: [name], female    DOB: 07/04/1941, 76 y.o.   MRN: [id number]  C.C.: Acute visit after hospitalization with known Moderate COPD, Chronic Allergic Rhinitis, & GERD w/ Hiatal Hernia.  HPI  Follow-up after patient hospitalized at [facility] in [place], [place]. Patient with questionable right middle and lower lobe pneumonia on x-ray imaging. She reports she spiked a temperature of 104F. She had altered mentation. She was treated with Levaquin. She was hospitalized for 3 days.   Moderate COPD: Prescribed Symbicort and Spiriva. She reports no recent coughing or wheezing. She reports her dyspnea has improved. She is using her rescue inhaler twice daily and does feel like it helps.   Chronic allergic rhinitis: Prescribed Singulair, Astelin, & Flonase. Previously using nasal saline rinse. No sinus congestion, pressure or drainage.   GERD with hiatal hernia: Followed by GI. On Prilosec. No abdominal pain or nausea. No emesis or regurgitation.   Review of Systems  No abdominal pain, nausea, emesis, or diarrhea. No fever or chills recently. No rashes or bruising out of her normal.   Allergies  Allergen Reactions  . Clarithromycin Other (See Comments)    REACTION: possible rash but could have been legionarres dz with rash  . Lipitor [Atorvastatin] Other (See Comments)    MUSCLE SPASMS  . Simvastatin Other (See Comments)    Muscle and joint pain  . Cardizem [Diltiazem Hcl] Hives  . Contrast Media [Iodinated Diagnostic Agents]     FYI - during admission at [facility] 08/2016 patient developed hives, question related to cardizem or contrast dye - not clear  . Crestor [Rosuvastatin Calcium]     fagitue and joint pain, "NO STATINS"    Current Outpatient Prescriptions on File Prior to Visit  Medication Sig Dispense Refill  . albuterol (PROVENTIL HFA;VENTOLIN HFA) 108 (90 Base) MCG/ACT inhaler Inhale 1-2 puffs into the lungs every 6 (six) hours as needed  for wheezing or shortness of breath. 3 Inhaler 4  . apixaban (ELIQUIS) 5 MG TABS tablet Take 1 tablet (5 mg total) by mouth 2 (two) times daily. 180 tablet 1  . azelastine (ASTELIN) 0.1 % nasal spray Place 2 sprays into the nose daily. Use in each nostril as directed (Patient taking differently: Place 2 sprays into both nostrils daily. ) 90 mL 1  . budesonide-formoterol (SYMBICORT) 160-4.5 MCG/ACT inhaler Inhale 2 puffs into the lungs 2 (two) times daily. 3 Inhaler 4  . buPROPion (WELLBUTRIN XL) 300 MG 24 hr tablet Take 1 tablet (300 mg total) by mouth daily. 90 tablet 3  . Calcium Carbonate-Vitamin D (CALCIUM + D PO) Take 1 tablet by mouth 2 (two) times daily.     . cyclobenzaprine (FLEXERIL) 5 MG tablet Take 1 tablet (5 mg total) by mouth 3 (three) times daily as needed for muscle spasms. 270 tablet 0  . fluticasone (FLONASE) 50 MCG/ACT nasal spray Place 1 spray into both nostrils 2 (two) times daily. 48 g 1  . furosemide (LASIX) 20 MG tablet Take 1 tablet (20 mg total) by mouth as needed (swelling). 90 tablet 0  . gabapentin (NEURONTIN) 300 MG capsule 1 tab by mouth in AM, 1 po at midday, then 2 by mouth at bedtime for sleep and pain 120 capsule 5  . guaiFENesin (MUCINEX) 600 MG 12 hr tablet Take 1,200 mg by mouth 2 (two) times daily as needed for cough or to loosen phlegm.    Marland Kitchen levothyroxine (SYNTHROID, LEVOTHROID) 25 MCG tablet Take  1 tablet (25 mcg total) by mouth daily before breakfast. 30 tablet 0  . Lidocaine HCl 2 % CREA Apply 1 application topically daily as needed (for shingles flares).    . LORazepam (ATIVAN) 0.5 MG tablet 1-2 tab by mouth at bedtime as needed for sleep 60 tablet 5  . Melatonin 5 MG TABS Take 5 mg by mouth at bedtime.    . metoprolol succinate (TOPROL-XL) 100 MG 24 hr tablet Take 1 tablet (100 mg total) by mouth daily. Take with or immediately following a meal. 90 tablet 3  . mirabegron ER (MYRBETRIQ) 50 MG TB24 tablet Take 50 mg by mouth daily.    . montelukast  (SINGULAIR) 10 MG tablet Take 1 tablet (10 mg total) by mouth daily. 90 tablet 1  . Multiple Vitamin (MULTIVITAMIN) tablet Take 1 tablet by mouth daily.      Marland Kitchen nystatin (MYCOSTATIN) 100000 UNIT/ML suspension Take 5 cc by mouth, swish and swallow 4 times daily. 280 mL 0  . omeprazole (PRILOSEC) 40 MG capsule Take 1 capsule (40 mg total) by mouth daily. 90 capsule 3  . Probiotic Product (PROBIOTIC DAILY PO) Take by mouth.    . Respiratory Therapy Supplies (FLUTTER) DEVI Use 2-3 times per day 1 each 0  . Spacer/Aero-Holding Chambers (AEROCHAMBER Z-STAT PLUS) inhaler Use as instructed 1 each 0  . Tiotropium Bromide Monohydrate (SPIRIVA RESPIMAT) 2.5 MCG/ACT AERS Inhale 2 puffs into the lungs daily. 3 Inhaler 3  . albuterol (PROVENTIL) (2.5 MG/3ML) 0.083% nebulizer solution Take 3 mLs (2.5 mg total) by nebulization every 6 (six) hours as needed for wheezing or shortness of breath. (Patient not taking: Reported on 06/02/2017) 1080 mL 3  . ezetimibe (ZETIA) 10 MG tablet Take 1 tablet (10 mg total) by mouth daily. 90 tablet 3  . [DISCONTINUED] valsartan (DIOVAN) 160 MG tablet Take 1 tablet (160 mg total) by mouth daily. 90 tablet 3   No current facility-administered medications on file prior to visit.     Past Medical History:  Diagnosis Date  . Allergic rhinitis   . Anxiety   . Atrial septal aneurysm   . Basal cell carcinoma   . Bradycardia   . Cataract    bil cataracts removed  . Chronic combined systolic and diastolic CHF (congestive heart failure) ([place])   . CKD (chronic kidney disease), stage III   . Clostridium difficile infection   . COPD (chronic obstructive pulmonary disease) ([place])   . Depression 11/29/2013  . DI (detrusor instability)   . Esophageal stricture   . GERD (gastroesophageal reflux disease)   . Hemorrhoids   . Hiatal hernia   . History of kidney stones   . HLD (hyperlipidemia)   . HTN (hypertension)   . Ischemic colon ([place])    a. 08/2016 - adm to  [facility]  with a ruptured colon s/p right hemicolectomy with ileostomy formation for ischemic and necrotic colon.   . Lung nodule    a. followed by pulmonology  . Mild CAD    a. minor nonobstructive CAD 08/22/16.  . Nephrolithiasis    hx  . NICM (nonischemic cardiomyopathy) ([place])   . Orthostatic hypotension   . Osteoarthritis   . PAF (paroxysmal atrial fibrillation) ([place])    a. diagnosed 07/2016 s/p DCCV 08/01/16 with recurrence, managed with rate control for now.   . Pneumonia 2013  . PVC's (premature ventricular contractions)   . Shingles since Nov 18, 2013   on right arm from shoulder to wrist  .  Status post dilation of esophageal narrowing   . Stroke [facility]) 2001   STROKES, TIA'S  . Syncope    a. 08/2016 felt due to orthostatic hypotension.  Marland Kitchen TIA (transient ischemic attack) last 2001   anticoagulation therapy on coumadin    Past Surgical History:  Procedure Laterality Date  . ANTERIOR AND POSTERIOR VAGINAL REPAIR  2008   A&P REPAIR-DR MCDIARMID  . ANTERIOR LATERAL LUMBAR FUSION 4 LEVELS Right 07/03/2015   Procedure: Right Lumbar one-two, Lumbar two-three, Lumbar three-four, Lumbar four-five Anterior lateral lumbar interbody fusion;  Surgeon: Erline Levine, MD;  Location: [place] NEURO ORS;  Service: Neurosurgery;  Laterality: Right;  Right L1-2 L2-3 L3-4 L4-5 Anterior lateral lumbar interbody fusion  . BALLOON DILATION N/A 03/03/2014   Procedure: BALLOON DILATION;  Surgeon: Inda Castle, MD;  [facility];  Service: Endoscopy;  Laterality: N/A;  . BLADDER SUSPENSION    . BREAST BIOPSY    . CARDIAC CATHETERIZATION N/A 08/22/2016   Procedure: Left Heart Cath and Coronary Angiography;  Surgeon: Peter M Martinique, MD;  Location: [place] CV LAB;  Service: Cardiovascular;  Laterality: N/A;  . CARDIOVERSION N/A 08/01/2016   Procedure: CARDIOVERSION;  Surgeon: Jerline Pain, MD;  Location: [facility];  Service: Cardiovascular;  Laterality: N/A;  . CARDIOVERSION N/A 10/11/2016   Procedure:  CARDIOVERSION;  Surgeon: Sueanne Margarita, MD;  Location: [facility];  Service: Cardiovascular;  Laterality: N/A;  . COLONOSCOPY    . ESOPHAGOGASTRODUODENOSCOPY N/A 03/03/2014   Procedure: ESOPHAGOGASTRODUODENOSCOPY (EGD);  Surgeon: Inda Castle, MD;  Location: [facility];  Service: Endoscopy;  Laterality: N/A;  . EXCISION OF BASAL CELL CA  2011  . GLUTEUS MINIMUS REPAIR Right 03/15/2016   Procedure: RIGHT HIP GLUTEUS MEDIUS TENDON REPAIR;  Surgeon: Paralee Cancel, MD;  [facility];  Service: Orthopedics;  Laterality: Right;  . ileostomy reversal    . LUMBAR PERCUTANEOUS PEDICLE SCREW 4 LEVEL Right 07/03/2015   Procedure: Lumbar one-Sacral one Bilateral percutaneous pedicle screws;  Surgeon: Erline Levine, MD;  Location: [place] NEURO ORS;  Service: Neurosurgery;  Laterality: Right;  L1-S1 Bilateral percutaneous pedicle screws  . RECTOCELE REPAIR  2008   A&P REPAIR -DR. MCDIARMID  . THUMB SURGERY Right 10-15 yrs ago  . UPPER GASTROINTESTINAL ENDOSCOPY    . VAGINAL HYSTERECTOMY  1983   NO BSO-DR. MABRY  . VARICOSE VEIN SURGERY      Family History  Problem Relation Age of Onset  . COPD Mother   . Breast cancer Mother        mid 73's  . Uterine cancer Mother   . Emphysema Mother   . Breast cancer Maternal Aunt        x 3 aunts, post menopausal  . Breast cancer Maternal Aunt   . Breast cancer Maternal Aunt   . Colon cancer Neg Hx   . Esophageal cancer Neg Hx   . Rectal cancer Neg Hx   . Stomach cancer Neg Hx   . Pancreatic cancer Neg Hx     Social History   Social History  . Marital status: Married    Spouse name: Shanon Brow  . Number of children: 2  . Years of education: N/A   Occupational History  . retired Retired   Social History Main Topics  . Smoking status: Former Smoker    Packs/day: 3.00    Years: 42.00    Types: Cigarettes    Start date: 08/04/1954    Quit date: 12/06/1995  . Smokeless tobacco: Never  Used  . Alcohol use No     Comment: occas very little  . Drug  use: No  . Sexual activity: Not Currently    Birth control/ protection: Surgical   Other Topics Concern  . None   Social History Narrative   Retired- Dentist Pulmonary:   Originally from [place]. Has always lived in [place]. She has traveled to [place], [place], [place], [place], [place], [place]. Has been on multiple cruises. 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 She did use a face mask consistently to avoid chemical fume exposure. She may have only had an inhaled exposure a couple of times. She currently has a dog. Remote exposure to a parakeet. No mold or hot tub exposure. No asbestos exposure.       Objective:   Physical Exam BP 110/72 (BP Location: Left Arm, Patient Position: Sitting, Cuff Size: Normal)   Pulse (!) 51   Ht 5\' 4"  (1.626 m)   Wt 127 lb (57.6 kg)   SpO2 96%   BMI 21.80 kg/m   General:  Awake. Alert. No distress. Accompanied by husband today. Integument:  Warm & dry. No rash on exposed skin.  Extremities:  No cyanosis or clubbing.  HEENT:  Moist mucus membranes. No nasal turbinate swelling. No oral ulcers. Cardiovascular:  Regular rate. No edema. No appreciable JVD given body positioning.  Pulmonary:  Clear to auscultation. No accessory muscle use on room air. Good aeration bilaterally. Abdomen: Soft. Normal bowel sounds. Nondistended.  Musculoskeletal:  Normal bulk and tone. No joint deformity or effusion appreciated.  PFT 10/10/16: FVC 1.94 L (70%) FEV1 0.93 L (45%) FEV1/FVC 0.48 FEF 25-75 0.35 L (22%) positive bronchodilator response 03/01/16: FVC 2.55 L (91%) FEV1 1.30 L (62%) FEV1/FVC 0.51 FEF 25-75 0.56 L (34%) negative bronchodilator response TLC 5.66 L (111%) RV 116% ERV 133% DLCO uncorrected 62% (hemoglobin 15.1) 12/28/11:FVC 2.89 L (104%) FEV1 1.22 L (62%) FEV1/FVC 0.42 FEF 25-75 0.33 L (15%) negative bronchodilator response TLC 6.19 L (129%) RV 163%  ERV 114% DLCO uncorrected 69%  6MWT 10/10/16:  Walked 96 meters / Baseline Sat 93% on RA / Nadir Sat 93% on RA @ rest (couldn't finish due to weakness & dyspnea  W/ 2:57 left) 08/24/16:  Walked 1 lap & was 96% at lowest on room air  IMAGING CXR PA/LAT 05/18/17 (personally reviewed by me):  Patchy opacification in right middle and lower lobes. No pleural effusion appreciated. Heart normal in size & mediastinum normal in contour.  CT CHEST W/O 01/23/17 (Previously reviewed by me): Resolution of groundglass opacity. 7 mm right lower lobe nodule unchanged. Apical predominant emphysematous changes. Right middle lobe nodule was not appreciated on this image. No pleural effusion or thickening. No pathologic mediastinal adenopathy. No pericardial effusion.  CTA CHEST 08/17/16 (previously reviewed by me):  No evidence of PE. No pleural effusion or thickening. No pathologic mediastinal adenopathy. Groundglass noted within right lung predominantly in the upper lung zone. Moderate upper lobe predominant emphysema which has progressed since 2015. 7 mm right lower lobe nodule has not changed since 2015. Patient has a 4 mm nodule within right middle lobe which appears new.  CXR PA/LAT 05/25/16 (previously reviewed by me): Hyperinflation with flattening of the diaphragms. No parenchymal opacity or mass appreciated. No pleural effusion. Heart normal in size & mediastinum normal in contour.  CXR PA/LAT 03/22/16 (previously reviewed by me):  Hyperinflation  with elevation of left hemidiaphragm. No new focal opacity. Heart normal in size & mediastinum normal in contour.  CXR PA/LAT 11/09/15 (previously reviewed by me): No focal opacity or effusion appreciated. Heart normal in size. Mediastinum normal in contour. Mild hyperinflation with flattening of the diaphragms.   CARDIAC TTE (06/24/16): LVEF 45-50% with grade 2 diastolic dysfunction. LA moderately to severely dilated. RA normal in size. RV normal in size and function.  No aortic stenosis or regurgitation. Moderate centrally directed mitral regurgitation. Paradoxical ventricular septal motion consistent with intraventricular conduction delay. No pulmonic regurgitation. No tricuspid regurgitation. No pericardial effusion.  LABS 12/22/15 Alpha-1 antitrypsin: MM (143)    Assessment & Plan:  76 y.o. female with known history of moderate COPD, chronic allergic rhinitis, and GERD with hiatal hernia recently hospitalized with right-sided pneumonia. Patient's x-ray was reviewed by me today which did show patchy opacification within right middle and lower lobe. She seems to recover well after treatment with Levaquin. We will need to repeat chest x-ray imaging to ensure resolution of opacities. Overall, her COPD, allergic rhinitis, and reflux seem to be well-controlled and she has suffered minimal setback in terms of her symptom control. I instructed the patient to contact me if she had any new breathing problems or questions before next appointment.  1. Right lung pneumonia: Repeat chest x-ray PA/LAT July 16. 2. Moderate COPD: Continuing Symbicort and Spiriva. No changes. 3. Chronic allergic rhinitis: Minimal symptoms. Continuing current regimen with nasal saline rinse. 4. GERD with hiatal hernia: Well-controlled. Continuing Prilosec. 5. Health maintenance: Status post influenza vaccine September 2017, Prevnar December 2014, & Pneumovax January 2008. 6. Follow-up: Return to clinic in September as previously scheduled.  Sonia Baller Ashok Cordia, M.D. [facility] Pulmonary & Critical Care Pager:  [phone] After 3pm or if no response, call [phone] 1:16 PM 06/02/17

## 2017-06-03 ENCOUNTER — Encounter: Payer: Self-pay | Admitting: Gastroenterology

## 2017-06-10 ENCOUNTER — Encounter: Payer: Self-pay | Admitting: Internal Medicine

## 2017-06-12 ENCOUNTER — Encounter: Payer: Self-pay | Admitting: Physician Assistant

## 2017-06-12 ENCOUNTER — Telehealth: Payer: Self-pay | Admitting: *Deleted

## 2017-06-12 MED ORDER — BUPROPION HCL ER (XL) 300 MG PO TB24: 300.0000 mg | ORAL_TABLET | Freq: Every day | ORAL | 0 refills | Status: DC

## 2017-06-12 MED ORDER — APIXABAN 5 MG PO TABS: 5.0000 mg | ORAL_TABLET | Freq: Two times a day (BID) | ORAL | 2 refills | Status: DC

## 2017-06-12 NOTE — Telephone Encounter (Signed)
Pt left msg on triage stating her mail service is out of her buPROPion. Was told to contact MD to have them to send 30 day script to her local pharmacy. Called pt back verified pharmacy inform sending rx electronically to [place] as we speak...Johny Chess

## 2017-06-19 ENCOUNTER — Encounter: Payer: Self-pay | Admitting: *Deleted

## 2017-06-19 ENCOUNTER — Ambulatory Visit (INDEPENDENT_AMBULATORY_CARE_PROVIDER_SITE_OTHER)
Admission: RE | Admit: 2017-06-19 | Discharge: 2017-06-19 | Disposition: A | Discharge status: Home / Self Care | Payer: Medicare Other | Source: Ambulatory Visit | Attending: Pulmonary Disease | Admitting: Pulmonary Disease

## 2017-06-19 DIAGNOSIS — J181 Lobar pneumonia, unspecified organism: Secondary | ICD-10-CM | POA: Diagnosis not present

## 2017-06-19 DIAGNOSIS — J189 Pneumonia, unspecified organism: Secondary | ICD-10-CM | POA: Diagnosis not present

## 2017-06-20 ENCOUNTER — Telehealth: Payer: Self-pay | Admitting: Internal Medicine

## 2017-06-20 NOTE — Telephone Encounter (Signed)
Dr. Jenny Reichmann please advise. Should pt come in for an OV?

## 2017-06-20 NOTE — Telephone Encounter (Signed)
Please make ROV

## 2017-06-20 NOTE — Telephone Encounter (Signed)
Pt called stating that she was seen at [facility] about a month ago for pneumonia. Someone from the hospital has been calling to check on her. She said that they recommended that she call our office because her feet and ankles have been swelling for almost 3 weeks. She has been taking her Lasix every day except 3 days. She person from [facility] said that she may be depleted of potassium and sodium and recommended that she have blood work done to check this. She can be reached at [phone].

## 2017-06-21 ENCOUNTER — Other Ambulatory Visit (INDEPENDENT_AMBULATORY_CARE_PROVIDER_SITE_OTHER): Payer: Medicare Other

## 2017-06-21 ENCOUNTER — Ambulatory Visit (INDEPENDENT_AMBULATORY_CARE_PROVIDER_SITE_OTHER): Payer: Medicare Other | Admitting: Pulmonary Disease

## 2017-06-21 ENCOUNTER — Encounter: Payer: Self-pay | Admitting: Pulmonary Disease

## 2017-06-21 VITALS — BP 114/70 | HR 61 | Temp 98.3°F | Ht 64.0 in | Wt 124.0 lb

## 2017-06-21 DIAGNOSIS — J441 Chronic obstructive pulmonary disease with (acute) exacerbation: Secondary | ICD-10-CM

## 2017-06-21 LAB — CBC
HCT: 35.4 % — ABNORMAL LOW (ref 36.0–46.0)
Hemoglobin: 11.2 g/dL — ABNORMAL LOW (ref 12.0–15.0)
MCHC: 31.6 g/dL (ref 30.0–36.0)
MCV: 74.2 fl — ABNORMAL LOW (ref 78.0–100.0)
Platelets: 224 10*3/uL (ref 150.0–400.0)
RBC: 4.77 Mil/uL (ref 3.87–5.11)
RDW: 22.6 % — ABNORMAL HIGH (ref 11.5–15.5)
WBC: 6.6 10*3/uL (ref 4.0–10.5)

## 2017-06-21 LAB — BASIC METABOLIC PANEL
BUN: 19 mg/dL (ref 6–23)
CO2: 26 mEq/L (ref 19–32)
Calcium: 9.9 mg/dL (ref 8.4–10.5)
Chloride: 101 mEq/L (ref 96–112)
Creatinine, Ser: 1.34 mg/dL — ABNORMAL HIGH (ref 0.40–1.20)
GFR: 40.67 mL/min — ABNORMAL LOW (ref >60.00)
Glucose, Bld: 78 mg/dL (ref 70–99)
Potassium: 4.4 mEq/L (ref 3.5–5.1)
Sodium: 136 mEq/L (ref 135–145)

## 2017-06-21 NOTE — Progress Notes (Signed)
[name]   Chief Complaint  Patient presents with  . Acute Visit    Has been coughing up green mucus for the past 3 days. Was in the hospital last month for pneumonia. Body aches (she received a shingles vaccine yesterday and believes that is the cause of the pain). Denies any fever.      Primary Pulmonologist: Dr. Ashok Cordia  Current Outpatient Prescriptions on File Prior to Visit  Medication Sig  . albuterol (PROVENTIL HFA;VENTOLIN HFA) 108 (90 Base) MCG/ACT inhaler Inhale 1-2 puffs into the lungs every 6 (six) hours as needed for wheezing or shortness of breath.  Marland Kitchen albuterol (PROVENTIL) (2.5 MG/3ML) 0.083% nebulizer solution Take 3 mLs (2.5 mg total) by nebulization every 6 (six) hours as needed for wheezing or shortness of breath.  Marland Kitchen apixaban (ELIQUIS) 5 MG TABS tablet Take 1 tablet (5 mg total) by mouth 2 (two) times daily.  Marland Kitchen azelastine (ASTELIN) 0.1 % nasal spray Place 2 sprays into the nose daily. Use in each nostril as directed (Patient taking differently: Place 2 sprays into both nostrils daily. )  . budesonide-formoterol (SYMBICORT) 160-4.5 MCG/ACT inhaler Inhale 2 puffs into the lungs 2 (two) times daily.  Marland Kitchen buPROPion (WELLBUTRIN XL) 300 MG 24 hr tablet Take 1 tablet (300 mg total) by mouth daily.  . Calcium Carbonate-Vitamin D (CALCIUM + D PO) Take 1 tablet by mouth 2 (two) times daily.   . cyclobenzaprine (FLEXERIL) 5 MG tablet Take 1 tablet (5 mg total) by mouth 3 (three) times daily as needed for muscle spasms.  . fluticasone (FLONASE) 50 MCG/ACT nasal spray Place 1 spray into both nostrils 2 (two) times daily.  . furosemide (LASIX) 20 MG tablet Take 1 tablet (20 mg total) by mouth as needed (swelling).  . gabapentin (NEURONTIN) 300 MG capsule 1 tab by mouth in AM, 1 po at midday, then 2 by mouth at bedtime for sleep and pain  . guaiFENesin (MUCINEX) 600 MG 12 hr tablet Take 1,200 mg by mouth 2 (two) times daily as needed for cough or to loosen phlegm.  Marland Kitchen levothyroxine  (SYNTHROID, LEVOTHROID) 25 MCG tablet Take 1 tablet (25 mcg total) by mouth daily before breakfast.  . Lidocaine HCl 2 % CREA Apply 1 application topically daily as needed (for shingles flares).  . LORazepam (ATIVAN) 0.5 MG tablet 1-2 tab by mouth at bedtime as needed for sleep  . Melatonin 5 MG TABS Take 5 mg by mouth at bedtime.  . metoprolol succinate (TOPROL-XL) 100 MG 24 hr tablet Take 1 tablet (100 mg total) by mouth daily. Take with or immediately following a meal.  . mirabegron ER (MYRBETRIQ) 50 MG TB24 tablet Take 50 mg by mouth daily.  . montelukast (SINGULAIR) 10 MG tablet Take 1 tablet (10 mg total) by mouth daily.  . Multiple Vitamin (MULTIVITAMIN) tablet Take 1 tablet by mouth daily.    Marland Kitchen nystatin (MYCOSTATIN) 100000 UNIT/ML suspension Take 5 cc by mouth, swish and swallow 4 times daily.  Marland Kitchen omeprazole (PRILOSEC) 40 MG capsule Take 1 capsule (40 mg total) by mouth daily.  . Probiotic Product (PROBIOTIC DAILY PO) Take by mouth.  . Respiratory Therapy Supplies (FLUTTER) DEVI Use 2-3 times per day  . Spacer/Aero-Holding Chambers (AEROCHAMBER Z-STAT PLUS) inhaler Use as instructed  . Tiotropium Bromide Monohydrate (SPIRIVA RESPIMAT) 2.5 MCG/ACT AERS Inhale 2 puffs into the lungs daily.  Marland Kitchen ezetimibe (ZETIA) 10 MG tablet Take 1 tablet (10 mg total) by mouth daily.  . [DISCONTINUED] valsartan (DIOVAN) 160 MG  tablet Take 1 tablet (160 mg total) by mouth daily.   No current facility-administered medications on file prior to visit.      Studies: Alpha-1 Antitrypsin 12/22/15 >> MM (143) TTE 06/24/16 > LVEF 45-80%, grade 2 diastolic dysfunction, LA moderately to severely dilated, RA, RV normal in size.  CT Chest w/o 01/23/17 >> resolution of GGO, no active lung disease, moderate emphysema, moderate hiatal hernia CXR 01/20/17 >> interval resolution of right lung opacity, stable changes of COPD, moderate hiatal hernia  Past Medical Hx:  has a past medical history of Allergic rhinitis; Anxiety;  Atrial septal aneurysm; Basal cell carcinoma; Bradycardia; Cataract; Chronic combined systolic and diastolic CHF (congestive heart failure) ([place]); CKD (chronic kidney disease), stage III; Clostridium difficile infection; COPD (chronic obstructive Ray disease) ([place]); Depression (11/29/2013); DI (detrusor instability); Esophageal stricture; GERD (gastroesophageal reflux disease); Hemorrhoids; Hiatal hernia; History of kidney stones; HLD (hyperlipidemia); HTN (hypertension); Ischemic colon ([place]); Lung nodule; Mild CAD; Nephrolithiasis; NICM (nonischemic cardiomyopathy) ([place]); Orthostatic hypotension; Osteoarthritis; PAF (paroxysmal atrial fibrillation) ([place]); Pneumonia (2013); PVC's (premature ventricular contractions); Shingles (since Nov 18, 2013); Status post dilation of esophageal narrowing; Stroke ([place]) (2001); Syncope; and TIA (transient ischemic attack) (last 2001).   Past Surgical hx, Allergies, Family hx, Social hx all reviewed.  Vital Signs BP 114/70 (BP Location: Left Arm, Patient Position: Sitting, Cuff Size: Normal)   Pulse 61   Temp 98.3 F (36.8 C) (Oral)   Ht 5\' 4"  (1.626 m)   Wt 124 lb (56.2 kg)   SpO2 94%   BMI 21.28 kg/m   History of Present Illness [name] is a 76 y.o. female, former smoker (quit 1997, 42 yrs x 3ppd) with a history of chronic combined systolic / diastolic CHF, chronic AF on coumadin, allergic rhinitis, COPD and recent PNA (05/2017) who presented to the [facility] office for increased cough, SOB and sputum production.    The patient was recently admitted in [facility] in [place] for RML, RLL PNA.  She was treated with Levaquin.  She reports she had improved post hospitalization.  Since admission, she has been checking her O2 sats and they have been running 90-92%.  She normally runs 95-95% at home/ prior to illness (she used to wear O2 but went to [facility] and was able to come off).  She has had increased LE swelling since discharge and has been  using her PRN lasix almost daily for the last two weeks. She notes prior to hospitalization, she weighed 128 lbs and weighed 130.9 at home today (she weighs herself daily).  She notes her heart rate has been running in the 60's/70's at home > no high readings.    On Sunday (7/15) she began having a cough with increased sputum production.  The color of the sputum has not changed - she reports her sputum is normally faint green/yellow.  She has also had some exertional shortness of breath and has been using her rescue inhaler up to three times per day.  Given her recent illness, she was concerned about getting worse and wanted to "get ahead of the symptoms".  She has had some wheezing in the last few days.   She denies fevers, chills, chest pain, pain with inspiration. She denies known sick contacts.     Goes out early, early in the am to water her flowers and has to use her inhaler before going out.   Recently put water stain on lattice work, no odor > ? Allergic component   COPD -  symbicort + spiriva  Allergic Rhinitis - singulair, astelin, flonase and Neil Med rinse  GERD - omeprazole    Physical Exam  General - well developed adult F in no acute distress, non-toxic appearing  ENT - No sinus tenderness, no oral exudate, no LAN Cardiac - s1s2 regular, no murmur Chest - even/non-labored, lungs bilaterally coarse with occasional rhonchi / wheezing. Back - No focal tenderness Abd - Soft, non-tender Ext - No edema Neuro - Normal strength Skin - No rashes Psych - normal mood, and behavior   Assessment/Plan  Discussion:  76 y/o F with combined CHF, AF, COPD who presented to the [facility] office with increased cough, sputum production and SOB.  She was recently admitted for RML/RLL PNA and feels she improved from this.  Follow up CXR was completed on 7/16 which demonstrated resolution of airspace disease.  New onset symptoms in the last three days.  Recent activity outside with flowers &  painting a fence that may have precipitated a COPD exacerbation.  In addition, I am concerned she may have a component of volume overload with increased SOB and LE edema from prior volume resuscitation with hospitalization.  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    Acute Exacerbation of COPD  Recent RML/RLL PNA Possible Component of Volume Overload  Plan: Prednisone 20 mg QD x5 days, quick taper to hopefully avoid fluid retention / CHF Hold antibiotics for now Obtain sputum culture Will order labs > CBC, BMP today given prednisone Rx to review WBC off medications and renal function given increased use of lasix in last two weeks Continue Mucinex Follow up with PCP in am for review of LE swelling  Continue prior home medications, lasix as ordered for now  Patient Instructions  1.  Take your prednisone as prescribed > 20 mg daily (in the am) for 5 days.   2.  We will get a CBC and BMP (labs) before you you start your prednisone to review your labs  3.  Keep your appointment with your primary MD for tomorrow  4. Continue all your prior Ray medications as prescribed 5. Continue to use Mucinex to help with secretion clearance  6.  Your Chest XRAY on 7/16 shows clearance of Pneumonia 7.  If you have new or worsening symptoms please call the office to be seen or report to the ER immediately      Noe Gens, NP-C [place]  [phone] 06/21/2017, 12:51 PM

## 2017-06-21 NOTE — Progress Notes (Signed)
Note reviewed.  Sonia Baller Ashok Cordia, M.D. [facility] Pulmonary & Critical Care Pager:  [phone] After 3pm or if no response, call [phone] 1:39 PM 06/21/17

## 2017-06-21 NOTE — Patient Instructions (Signed)
1.  Take your prednisone as prescribed > 20 mg daily (in the am) for 5 days.   2.  We will get a CBC and BMP (labs) before you you start your prednisone to review your labs  3.  Keep your appointment with your primary MD for tomorrow  4. Continue all your prior pulmonary medications as prescribed 5. Continue to use Mucinex to help with secretion clearance  6.  Your Chest XRAY on 7/16 shows clearance of Pneumonia 7.  If you have new or worsening symptoms please call the office to be seen or report to the ER immediately

## 2017-06-21 NOTE — Progress Notes (Signed)
Spoke with patient and informed her of results. She verbalized understanding and did not have any questions. Nothing further is needed.

## 2017-06-21 NOTE — Telephone Encounter (Signed)
Appointment scheduled.

## 2017-06-22 ENCOUNTER — Ambulatory Visit (INDEPENDENT_AMBULATORY_CARE_PROVIDER_SITE_OTHER): Payer: Medicare Other | Admitting: Internal Medicine

## 2017-06-22 ENCOUNTER — Encounter: Payer: Self-pay | Admitting: Internal Medicine

## 2017-06-22 VITALS — BP 122/68 | HR 73 | Ht 64.0 in | Wt 132.0 lb

## 2017-06-22 DIAGNOSIS — J438 Other emphysema: Secondary | ICD-10-CM

## 2017-06-22 DIAGNOSIS — Z889 Allergy status to unspecified drugs, medicaments and biological substances status: Secondary | ICD-10-CM | POA: Diagnosis not present

## 2017-06-22 DIAGNOSIS — M7989 Other specified soft tissue disorders: Secondary | ICD-10-CM

## 2017-06-22 MED ORDER — PREDNISONE 10 MG PO TABS: ORAL_TABLET | ORAL | 0 refills | Status: DC

## 2017-06-22 NOTE — Assessment & Plan Note (Signed)
Although leftover volume increase is certainly a consideration, she has right foot swelling only (no LLE edema) and in a particular pattern that is more consistent I think with MSK issue such as midfoot tendonitis vs DJD vs other, though she gives no hx to directly support this.  I asked her to continue to follow her lasix 20 mg daily prn instructions including daily wts at home (has varied only maybe 1 lb up or down in the past wk per pt), consider right foot films (but not clear if truly needed), tylenol prn, and take the prednisone as suggested per pulm yesterday if her labs were ok 7/18, as I believe this may help if tendonitis is truly present.  If not improved, I asked her to consider seeing Dr Smith//sports medicine for local foot ultrasound and tx as indicated

## 2017-06-22 NOTE — Assessment & Plan Note (Signed)
?   Mild exacerbation  - for prednisone 20 qd x 5 days as suggested per pulm

## 2017-06-22 NOTE — Patient Instructions (Signed)
Please take all new medication as prescribed - the prednisone, as this I suspect should help the lungs, the left arm rash and the right foot swelling  Please continue all other medications as before, and refills have been done if requested.  Please have the pharmacy call with any other refills you may need.  Please continue your efforts at being more active, low cholesterol diet, and weight control.  Please keep your appointments with your specialists as you may have planned

## 2017-06-22 NOTE — Assessment & Plan Note (Signed)
Today with incidental 2nd dose shingrix local reaction, doubt cellulitis, and would avoid antibx if possible given hx of c diff, and I suspect the prednisone would help this as well.

## 2017-06-22 NOTE — Progress Notes (Signed)
Subjective:    Patient ID: [name], female    DOB: November 19, 1941, 76 y.o.   MRN: [id number]  HPI  Here to f/u after recent pna/copd exacerbation followed closely by pulmonary, last seen just yesterday.  Was asked to see me today regarding right foot swelling (not left), pt relates this has been ongoing concern for over 1 wk, and was though logically it may be leftover fluid from IVF while recently hospd not yet all mobilized by her lasix 20 mg po prn.  Has taken the lasix every day except for one day last wk and today, but still the right foot is swelling.  Wt has overall increased as well, but also describes a localized pain to the mid/lateral right foot worse over the past wk, radiating to the lateral mallolar area, worse to walk, better to sit. Overall mild, has not thought much of it, and denies trauma or mis-step, redness, fever, recent hx of gout or pseudogout. Wt Readings from Last 3 Encounters:  06/22/17 132 lb (59.9 kg)  06/21/17 124 lb (56.2 kg)  06/02/17 127 lb (57.6 kg)  Also incidentally received her second injeciton shingrix to left deltoid area, today with 2x3 cm area itchy somewhat painful non raised nonvesicular erythem rash with trace swelling only.  Past Medical History:  Diagnosis Date  . Allergic rhinitis   . Anxiety   . Atrial septal aneurysm   . Basal cell carcinoma   . Bradycardia   . Cataract    bil cataracts removed  . Chronic combined systolic and diastolic CHF (congestive heart failure) ([place])   . CKD (chronic kidney disease), stage III   . Clostridium difficile infection   . COPD (chronic obstructive pulmonary disease) ([place])   . Depression 11/29/2013  . DI (detrusor instability)   . Esophageal stricture   . GERD (gastroesophageal reflux disease)   . Hemorrhoids   . Hiatal hernia   . History of kidney stones   . HLD (hyperlipidemia)   . HTN (hypertension)   . Ischemic colon ([place])    a. 08/2016 - adm to  [facility] with a ruptured colon s/p right  hemicolectomy with ileostomy formation for ischemic and necrotic colon.   . Lung nodule    a. followed by pulmonology  . Mild CAD    a. minor nonobstructive CAD 08/22/16.  . Nephrolithiasis    hx  . NICM (nonischemic cardiomyopathy) ([place])   . Orthostatic hypotension   . Osteoarthritis   . PAF (paroxysmal atrial fibrillation) ([place])    a. diagnosed 07/2016 s/p DCCV 08/01/16 with recurrence, managed with rate control for now.   . Pneumonia 2013  . PVC's (premature ventricular contractions)   . Shingles since Nov 18, 2013   on right arm from shoulder to wrist  . Status post dilation of esophageal narrowing   . Stroke [facility]) 2001   STROKES, TIA'S  . Syncope    a. 08/2016 felt due to orthostatic hypotension.  Marland Kitchen TIA (transient ischemic attack) last 2001   anticoagulation therapy on coumadin   Past Surgical History:  Procedure Laterality Date  . ANTERIOR AND POSTERIOR VAGINAL REPAIR  2008   A&P REPAIR-DR MCDIARMID  . ANTERIOR LATERAL LUMBAR FUSION 4 LEVELS Right 07/03/2015   Procedure: Right Lumbar one-two, Lumbar two-three, Lumbar three-four, Lumbar four-five Anterior lateral lumbar interbody fusion;  Surgeon: Erline Levine, MD;  Location: [place] NEURO ORS;  Service: Neurosurgery;  Laterality: Right;  Right L1-2 L2-3 L3-4 L4-5 Anterior lateral lumbar interbody fusion  .  BALLOON DILATION N/A 03/03/2014   Procedure: BALLOON DILATION;  Surgeon: Inda Castle, MD;  Location: [facility] ENDOSCOPY;  Service: Endoscopy;  Laterality: N/A;  . BLADDER SUSPENSION    . BREAST BIOPSY    . CARDIAC CATHETERIZATION N/A 08/22/2016   Procedure: Left Heart Cath and Coronary Angiography;  Surgeon: Peter M Martinique, MD;  Location: [place] CV LAB;  Service: Cardiovascular;  Laterality: N/A;  . CARDIOVERSION N/A 08/01/2016   Procedure: CARDIOVERSION;  Surgeon: Jerline Pain, MD;  Location: [place];  Service: Cardiovascular;  Laterality: N/A;  . CARDIOVERSION N/A 10/11/2016   Procedure: CARDIOVERSION;  Surgeon: Sueanne Margarita, MD;  Location: [facility] ENDOSCOPY;  Service: Cardiovascular;  Laterality: N/A;  . COLONOSCOPY    . ESOPHAGOGASTRODUODENOSCOPY N/A 03/03/2014   Procedure: ESOPHAGOGASTRODUODENOSCOPY (EGD);  Surgeon: Inda Castle, MD;  Location: [facility];  Service: Endoscopy;  Laterality: N/A;  . EXCISION OF BASAL CELL CA  2011  . GLUTEUS MINIMUS REPAIR Right 03/15/2016   Procedure: RIGHT HIP GLUTEUS MEDIUS TENDON REPAIR;  Surgeon: Paralee Cancel, MD;  Location: [facility] ORS;  Service: Orthopedics;  Laterality: Right;  . ileostomy reversal    . LUMBAR PERCUTANEOUS PEDICLE SCREW 4 LEVEL Right 07/03/2015   Procedure: Lumbar one-Sacral one Bilateral percutaneous pedicle screws;  Surgeon: Erline Levine, MD;  Location: [place] NEURO ORS;  Service: Neurosurgery;  Laterality: Right;  L1-S1 Bilateral percutaneous pedicle screws  . RECTOCELE REPAIR  2008   A&P REPAIR -DR. MCDIARMID  . THUMB SURGERY Right 10-15 yrs ago  . UPPER GASTROINTESTINAL ENDOSCOPY    . VAGINAL HYSTERECTOMY  1983   NO BSO-DR. MABRY  . VARICOSE VEIN SURGERY      reports that she quit smoking about 21 years ago. Her smoking use included Cigarettes. She started smoking about 62 years ago. She has a 126.00 pack-year smoking history. She has never used smokeless tobacco. She reports that she does not drink alcohol or use drugs. family history includes Breast cancer in her maternal aunt, maternal aunt, maternal aunt, and mother; COPD in her mother; Emphysema in her mother; Uterine cancer in her mother. Allergies  Allergen Reactions  . Clarithromycin Other (See Comments)    REACTION: possible rash but could have been legionarres dz with rash  . Lipitor [Atorvastatin] Other (See Comments)    MUSCLE SPASMS  . Simvastatin Other (See Comments)    Muscle and joint pain  . Cardizem [Diltiazem Hcl] Hives  . Contrast Media [Iodinated Diagnostic Agents]     FYI - during admission at [facility] 08/2016 patient developed hives, question related to cardizem or  contrast dye - not clear  . Crestor [Rosuvastatin Calcium]     fagitue and joint pain, "NO STATINS"   Current Outpatient Prescriptions on File Prior to Visit  Medication Sig Dispense Refill  . albuterol (PROVENTIL HFA;VENTOLIN HFA) 108 (90 Base) MCG/ACT inhaler Inhale 1-2 puffs into the lungs every 6 (six) hours as needed for wheezing or shortness of breath. 3 Inhaler 4  . albuterol (PROVENTIL) (2.5 MG/3ML) 0.083% nebulizer solution Take 3 mLs (2.5 mg total) by nebulization every 6 (six) hours as needed for wheezing or shortness of breath. 1080 mL 3  . apixaban (ELIQUIS) 5 MG TABS tablet Take 1 tablet (5 mg total) by mouth 2 (two) times daily. 180 tablet 2  . azelastine (ASTELIN) 0.1 % nasal spray Place 2 sprays into the nose daily. Use in each nostril as directed (Patient taking differently: Place 2 sprays into both nostrils daily. ) 90 mL  1  . budesonide-formoterol (SYMBICORT) 160-4.5 MCG/ACT inhaler Inhale 2 puffs into the lungs 2 (two) times daily. 3 Inhaler 4  . buPROPion (WELLBUTRIN XL) 300 MG 24 hr tablet Take 1 tablet (300 mg total) by mouth daily. 30 tablet 0  . Calcium Carbonate-Vitamin D (CALCIUM + D PO) Take 1 tablet by mouth 2 (two) times daily.     . cyclobenzaprine (FLEXERIL) 5 MG tablet Take 1 tablet (5 mg total) by mouth 3 (three) times daily as needed for muscle spasms. 270 tablet 0  . fluticasone (FLONASE) 50 MCG/ACT nasal spray Place 1 spray into both nostrils 2 (two) times daily. 48 g 1  . furosemide (LASIX) 20 MG tablet Take 1 tablet (20 mg total) by mouth as needed (swelling). 90 tablet 0  . gabapentin (NEURONTIN) 300 MG capsule 1 tab by mouth in AM, 1 po at midday, then 2 by mouth at bedtime for sleep and pain 120 capsule 5  . guaiFENesin (MUCINEX) 600 MG 12 hr tablet Take 1,200 mg by mouth 2 (two) times daily as needed for cough or to loosen phlegm.    Marland Kitchen levothyroxine (SYNTHROID, LEVOTHROID) 25 MCG tablet Take 1 tablet (25 mcg total) by mouth daily before breakfast. 30  tablet 0  . Lidocaine HCl 2 % CREA Apply 1 application topically daily as needed (for shingles flares).    . LORazepam (ATIVAN) 0.5 MG tablet 1-2 tab by mouth at bedtime as needed for sleep 60 tablet 5  . Melatonin 5 MG TABS Take 5 mg by mouth at bedtime.    . metoprolol succinate (TOPROL-XL) 100 MG 24 hr tablet Take 1 tablet (100 mg total) by mouth daily. Take with or immediately following a meal. 90 tablet 3  . mirabegron ER (MYRBETRIQ) 50 MG TB24 tablet Take 50 mg by mouth daily.    . montelukast (SINGULAIR) 10 MG tablet Take 1 tablet (10 mg total) by mouth daily. 90 tablet 1  . Multiple Vitamin (MULTIVITAMIN) tablet Take 1 tablet by mouth daily.      Marland Kitchen nystatin (MYCOSTATIN) 100000 UNIT/ML suspension Take 5 cc by mouth, swish and swallow 4 times daily. 280 mL 0  . omeprazole (PRILOSEC) 40 MG capsule Take 1 capsule (40 mg total) by mouth daily. 90 capsule 3  . Probiotic Product (PROBIOTIC DAILY PO) Take by mouth.    . Respiratory Therapy Supplies (FLUTTER) DEVI Use 2-3 times per day 1 each 0  . Spacer/Aero-Holding Chambers (AEROCHAMBER Z-STAT PLUS) inhaler Use as instructed 1 each 0  . Tiotropium Bromide Monohydrate (SPIRIVA RESPIMAT) 2.5 MCG/ACT AERS Inhale 2 puffs into the lungs daily. 3 Inhaler 3  . ezetimibe (ZETIA) 10 MG tablet Take 1 tablet (10 mg total) by mouth daily. 90 tablet 3  . [DISCONTINUED] valsartan (DIOVAN) 160 MG tablet Take 1 tablet (160 mg total) by mouth daily. 90 tablet 3   No current facility-administered medications on file prior to visit.    Review of Systems  Constitutional: Negative for other unusual diaphoresis or sweats HENT: Negative for ear discharge or swelling Eyes: Negative for other worsening visual disturbances Respiratory: Negative for stridor or other swelling  Gastrointestinal: Negative for worsening distension or other blood Genitourinary: Negative for retention or other urinary change Musculoskeletal: Negative for other MSK pain or swelling Skin:  Negative for color change or other new lesions Neurological: Negative for worsening tremors and other numbness  Psychiatric/Behavioral: Negative for worsening agitation or other fatigue All other system neg per pt    Objective:  Physical Exam BP 122/68   Pulse 73   Ht 5\' 4"  (1.626 m)   Wt 132 lb (59.9 kg)   SpO2 97%   BMI 22.66 kg/m  VS noted,  Constitutional: Pt appears in NAD HENT: Head: NCAT.  Right Ear: External ear normal.  Left Ear: External ear normal.  Eyes: . Pupils are equal, round, and reactive to light. Conjunctivae and EOM are normal Nose: without d/c or deformity Neck: Neck supple. Gross normal ROM Cardiovascular: Normal rate and regular rhythm.   Pulmonary/Chest: Effort normal and breath sounds without rales or wheezing.  Abd:  Soft, NT, ND, + BS, no organomegaly Neurological: Pt is alert. At baseline orientation, motor grossly intact Skin: Skin is warm. No rashes, other new lesions, no LE edema except right foot with mild primarily mildfoot lateral > medial aspect puffy swelling with mild tender and "fluid sack feeling", no ulcers or overlying skin change, and foot o/w neurovasc intact Psychiatric: Pt behavior is normal without agitation  No other exam findings  Lab Results  Component Value Date   WBC 6.6 06/21/2017   HGB 11.2 (L) 06/21/2017   HCT 35.4 (L) 06/21/2017   PLT 224.0 06/21/2017   GLUCOSE 78 06/21/2017   CHOL 190 04/26/2017   TRIG 269.0 (H) 04/26/2017   HDL 36.90 (L) 04/26/2017   LDLDIRECT 110.0 04/26/2017   LDLCALC 165 (H) 11/12/2015   ALT 15 04/26/2017   AST 20 04/26/2017   NA 136 06/21/2017   K 4.4 06/21/2017   CL 101 06/21/2017   CREATININE 1.34 (H) 06/21/2017   BUN 19 06/21/2017   CO2 26 06/21/2017   TSH 2.59 05/25/2017   INR 1.21 08/17/2016   HGBA1C 6.3 04/26/2017        Assessment & Plan:

## 2017-06-23 ENCOUNTER — Other Ambulatory Visit: Payer: Self-pay | Admitting: Internal Medicine

## 2017-06-23 DIAGNOSIS — K2100 Gastro-esophageal reflux disease with esophagitis, without bleeding: Secondary | ICD-10-CM

## 2017-06-23 DIAGNOSIS — K222 Esophageal obstruction: Secondary | ICD-10-CM

## 2017-06-23 MED ORDER — OMEPRAZOLE 40 MG PO CPDR: 40.0000 mg | DELAYED_RELEASE_CAPSULE | Freq: Every day | ORAL | 3 refills | Status: DC

## 2017-06-23 NOTE — Telephone Encounter (Signed)
Rx sent to pharmacy   

## 2017-06-26 ENCOUNTER — Telehealth: Payer: Self-pay | Admitting: Pulmonary Disease

## 2017-06-26 NOTE — Telephone Encounter (Signed)
Personally called Mrs. [name] to discuss her prior lab results and to check in on her regarding prednisone.  She reports she is feeling better.  She has one more day of prednisone and has noted less wheezing.  Unfortunately, she is not sleeping while on the prednisone.  She continues to use Mucinex, secretions are clearing and only occasionally are green.  Denies fevers, chills.  Patient encouraged to call if new needs arise.    Noe Gens, NP-C [facility] Pgr: [phone] or if no answer [phone] 06/26/2017, 3:37 PM

## 2017-06-28 IMAGING — DX DG CHEST 2V
2 series · 2 of 2 positions shown · non-contrast
Comparison: Chest x-ray 08/26/2016, 08/25/2016.

CLINICAL DATA: Cough and congestion .

EXAM:
CHEST  2 VIEW

[chest pa]
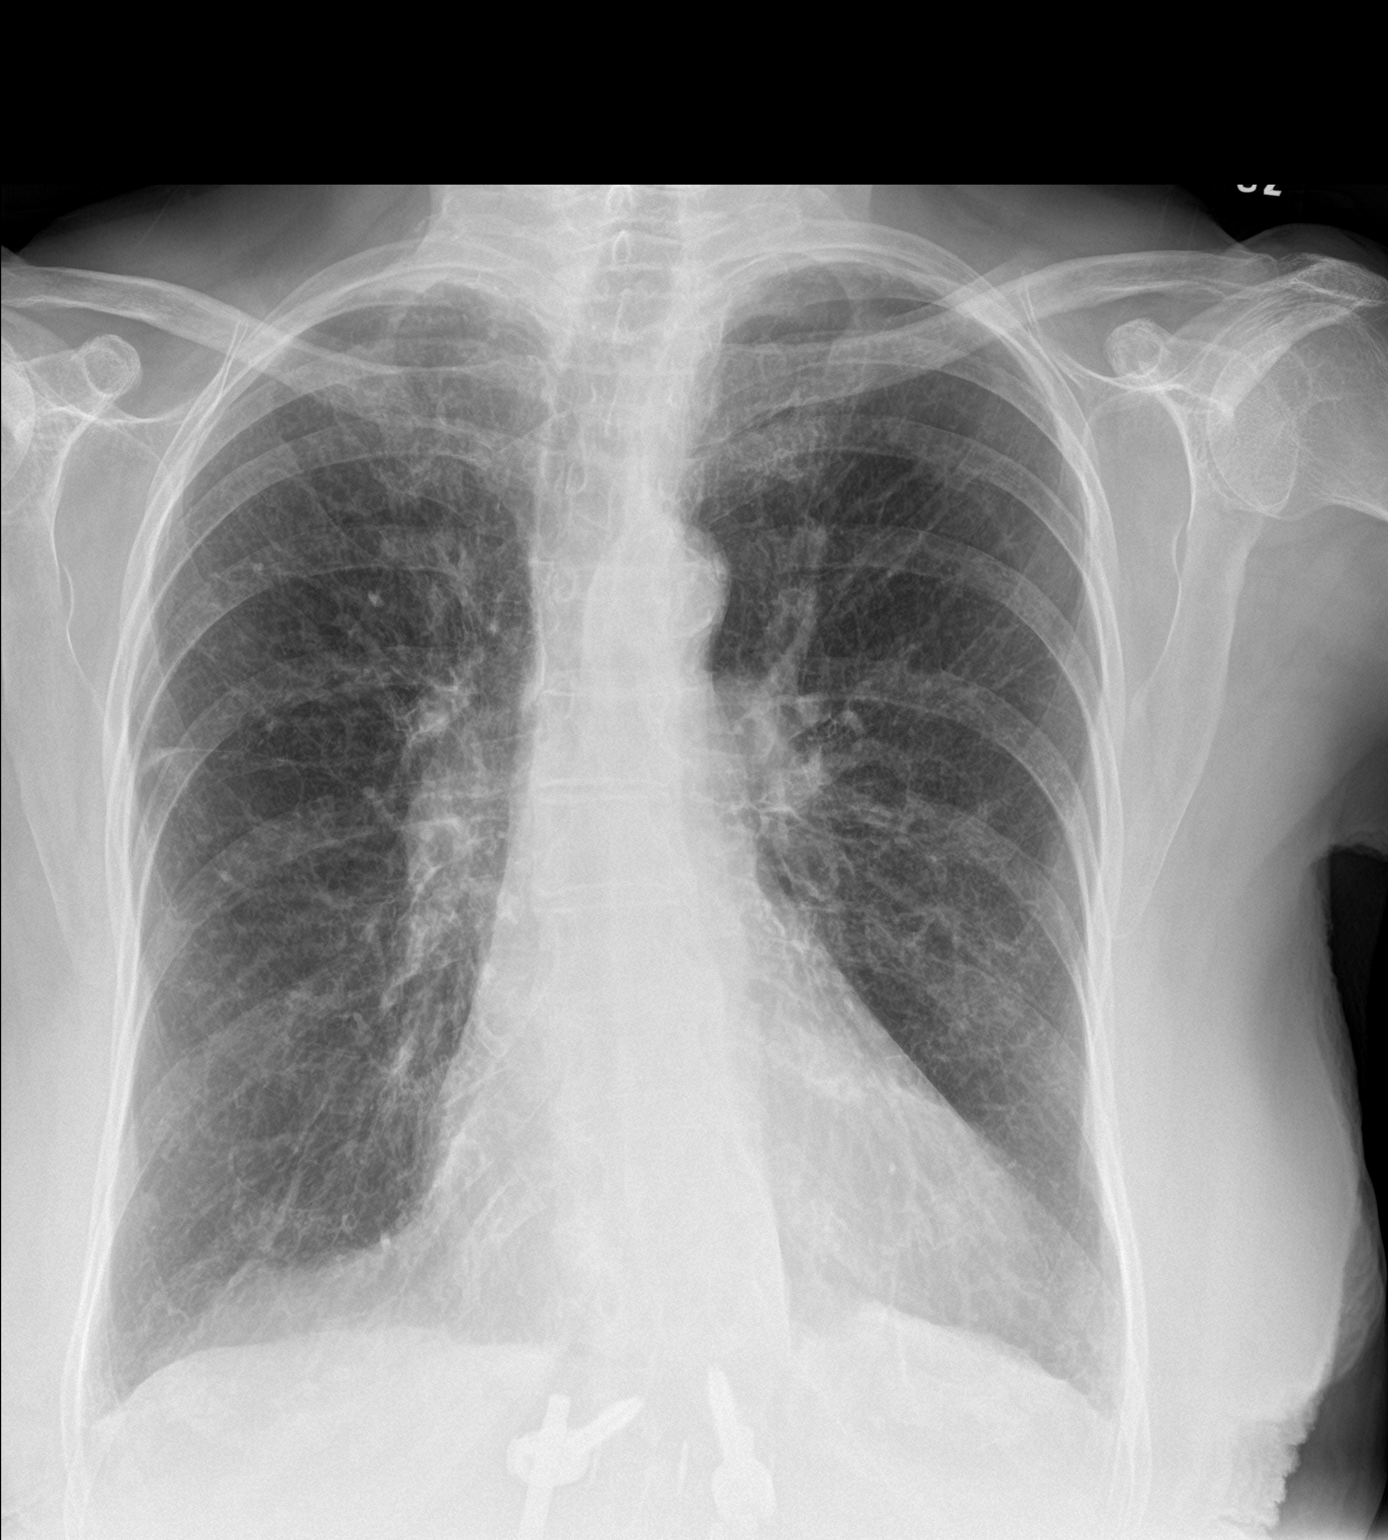

[chest lat]
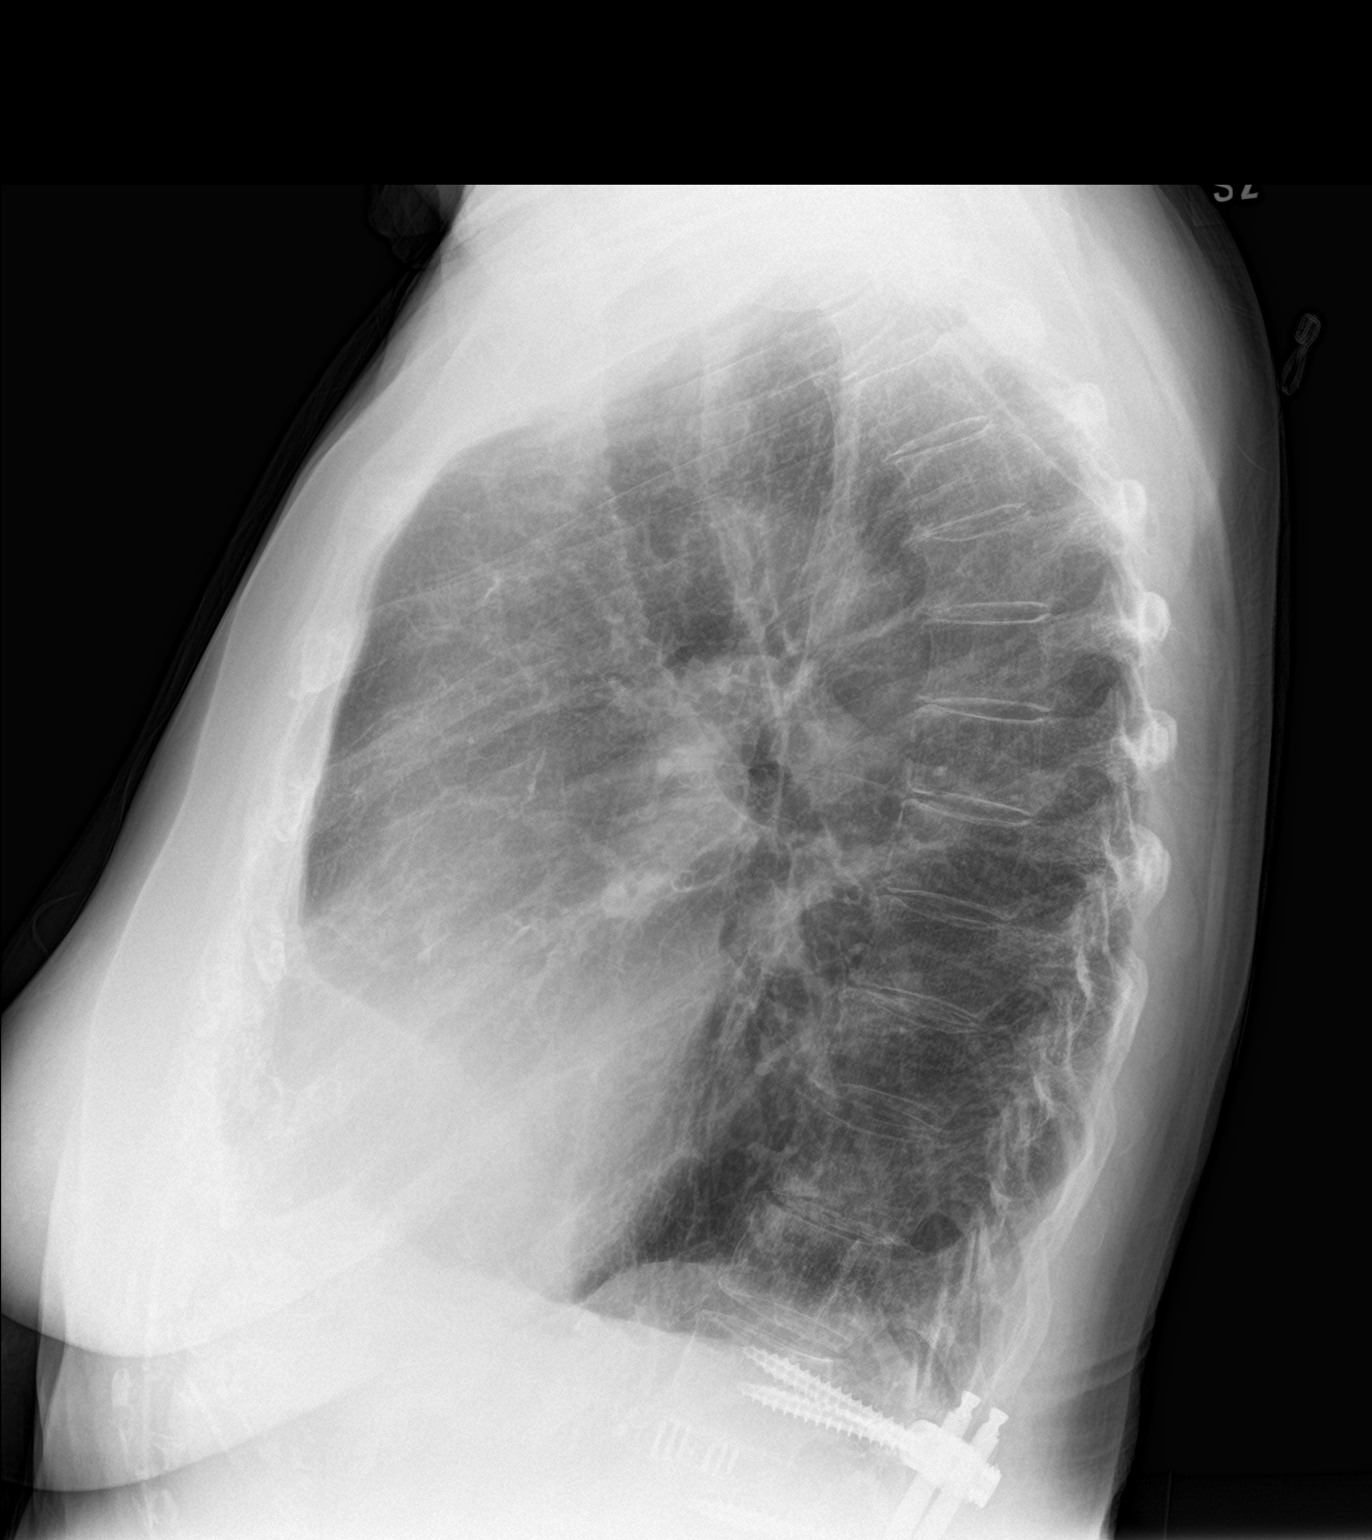

[2 of 2 positions shown; findings below may reference images not displayed]

FINDINGS: Mediastinum and hilar structures normal. Cardiomegaly with normal
pulmonary vascularity. Chronic bilateral pulmonary interstitial
prominence noted. This consistent with chronic interstitial lung
disease. Small calcified pulmonary nodules noted consistent with
prior granulomas disease. No acute bony abnormality. Prior lumbar
spine fusion .
IMPRESSION: 1.  Cardiomegaly, no pulmonary venous congestion.

2. Chronic interstitial lung disease. Small calcified pulmonary
nodules consistent with prior granulomas disease.

## 2017-06-29 ENCOUNTER — Telehealth: Payer: Self-pay | Admitting: Pulmonary Disease

## 2017-06-29 DIAGNOSIS — J441 Chronic obstructive pulmonary disease with (acute) exacerbation: Secondary | ICD-10-CM

## 2017-06-29 DIAGNOSIS — R05 Cough: Secondary | ICD-10-CM

## 2017-06-29 DIAGNOSIS — R059 Cough, unspecified: Secondary | ICD-10-CM

## 2017-06-29 MED ORDER — DOXYCYCLINE HYCLATE 100 MG PO TABS: 100.0000 mg | ORAL_TABLET | Freq: Two times a day (BID) | ORAL | 0 refills | Status: DC

## 2017-06-29 NOTE — Telephone Encounter (Signed)
Please order a Sputum Culture for AFB, Fungus, & Bacteria. Then send in a Rx for Doxycycline 100mg  po bid x 7days. She should avoid taking with dairy products. She should take with a full glass of water & remain upright for 1 hour afterward. Tell her to let us know if it gets worse or if she develops any diarrhea or abdominal pain. Thanks.

## 2017-06-29 NOTE — Telephone Encounter (Signed)
Spoke with pt and informed her of JN's message. Pt understood and agreed to the labs and the medication being called in. RX was sent in and order for sputum was placed. Pt will try to make her way when she can to the office for cup. She had no additional questions at this time. Nothing further is needed

## 2017-06-29 NOTE — Telephone Encounter (Signed)
JN  Please [place] Message  Pt last saw Brandi on 7/18 and was placed on prednisone, which she has finished she states. She states at that appointment they avoided her being placed on an antibiotic due to C-diff but she now feels like she does need an antibiotic due to her cough. When she does cough, she is able to produce green sputum, but it is hard to come up. She has been taking mucinex.  She denies fever,increase sob,wheezing.   Pharmacy- [facility]   Allergies  Allergen Reactions  . Clarithromycin Other (See Comments)    REACTION: possible rash but could have been legionarres dz with rash  . Lipitor [Atorvastatin] Other (See Comments)    MUSCLE SPASMS  . Simvastatin Other (See Comments)    Muscle and joint pain  . Cardizem [Diltiazem Hcl] Hives  . Contrast Media [Iodinated Diagnostic Agents]     FYI - during admission at [facility] 08/2016 patient developed hives, question related to cardizem or contrast dye - not clear  . Crestor [Rosuvastatin Calcium]     fagitue and joint pain, "NO STATINS"

## 2017-07-01 ENCOUNTER — Encounter: Payer: Self-pay | Admitting: Internal Medicine

## 2017-07-01 DIAGNOSIS — M25571 Pain in right ankle and joints of right foot: Secondary | ICD-10-CM

## 2017-07-03 NOTE — Telephone Encounter (Signed)
Shirron to help pt make appt with Dr Raeford Razor (new sports medicine) asap  thanks

## 2017-07-04 ENCOUNTER — Encounter: Payer: Self-pay | Admitting: Cardiology

## 2017-07-04 ENCOUNTER — Other Ambulatory Visit: Payer: Medicare Other

## 2017-07-04 ENCOUNTER — Ambulatory Visit (INDEPENDENT_AMBULATORY_CARE_PROVIDER_SITE_OTHER): Payer: Medicare Other | Admitting: Cardiology

## 2017-07-04 VITALS — BP 120/74 | HR 70 | Ht 64.0 in | Wt 134.2 lb

## 2017-07-04 DIAGNOSIS — I10 Essential (primary) hypertension: Secondary | ICD-10-CM | POA: Diagnosis not present

## 2017-07-04 DIAGNOSIS — L820 Inflamed seborrheic keratosis: Secondary | ICD-10-CM | POA: Diagnosis not present

## 2017-07-04 DIAGNOSIS — I481 Persistent atrial fibrillation: Secondary | ICD-10-CM

## 2017-07-04 DIAGNOSIS — D2239 Melanocytic nevi of other parts of face: Secondary | ICD-10-CM | POA: Diagnosis not present

## 2017-07-04 DIAGNOSIS — I428 Other cardiomyopathies: Secondary | ICD-10-CM

## 2017-07-04 DIAGNOSIS — I493 Ventricular premature depolarization: Secondary | ICD-10-CM | POA: Diagnosis not present

## 2017-07-04 DIAGNOSIS — C44319 Basal cell carcinoma of skin of other parts of face: Secondary | ICD-10-CM | POA: Diagnosis not present

## 2017-07-04 DIAGNOSIS — I4819 Other persistent atrial fibrillation: Secondary | ICD-10-CM

## 2017-07-04 NOTE — Progress Notes (Signed)
Electrophysiology Office Note   Date:  07/04/2017   ID:  Jaxsyn, Catalfamo 12-18-1940, MRN [id number]  PCP:  Biagio Borg, MD  Primary Electrophysiologist:  Constance Haw, MD    Chief Complaint  Patient presents with  . Follow-up    PVC's     History of Present Illness: [name] is a 76 y.o. female who presents today for electrophysiology evaluation.   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 Admitted 08/17/16 with ruptured colon status post right hemicolectomy with ileostomy for ischemic and necrotic colon.  Cardioversion 10/11/16. He also had ileostomy takedown and repair that was complicated by C. difficile.  Today, denies symptoms of palpitations, chest pain, shortness of breath, orthopnea, PND, lower extremity edema, claudication, dizziness, presyncope, syncope, bleeding, or neurologic sequela. The patient is tolerating medications without difficulties and is otherwise without complaint today. Currently she feels episodes of fatigue without other issues. She has not been exercising for the last few years due to all of her medical issues. She wishes to go back to the gym and feels that this may help with her fatigue.    Past Medical History:  Diagnosis Date  . Allergic rhinitis   . Anxiety   . Atrial septal aneurysm   . Basal cell carcinoma   . Bradycardia   . Cataract    bil cataracts removed  . Chronic combined systolic and diastolic CHF (congestive heart failure) ([place])   . CKD (chronic kidney disease), stage III   . Clostridium difficile infection   . COPD (chronic obstructive pulmonary disease) ([place])   . Depression 11/29/2013  . DI  (detrusor instability)   . Esophageal stricture   . GERD (gastroesophageal reflux disease)   . Hemorrhoids   . Hiatal hernia   . History of kidney stones   . HLD (hyperlipidemia)   . HTN (hypertension)   . Ischemic colon ([place])    a. 08/2016 - adm to  [facility] with a ruptured colon s/p right hemicolectomy with ileostomy formation for ischemic and necrotic colon.   . Lung nodule    a. followed by pulmonology  . Mild CAD    a. minor nonobstructive CAD 08/22/16.  . Nephrolithiasis    hx  . NICM (nonischemic cardiomyopathy) ([place])   . Orthostatic hypotension   . Osteoarthritis   . PAF (paroxysmal atrial fibrillation) ([place])    a. diagnosed 07/2016 s/p DCCV 08/01/16 with recurrence, managed with rate control for now.   . Pneumonia 2013  . PVC's (premature ventricular contractions)   . Shingles since Nov 18, 2013   on right arm from shoulder to wrist  . Status post dilation of esophageal narrowing   . Stroke [facility]) 2001   STROKES, TIA'S  . Syncope    a. 08/2016 felt due to orthostatic hypotension.  Marland Kitchen TIA (transient ischemic attack) last 2001   anticoagulation therapy on coumadin   Past Surgical History:  Procedure Laterality Date  . ANTERIOR AND POSTERIOR VAGINAL REPAIR  2008   A&P REPAIR-DR MCDIARMID  . ANTERIOR LATERAL LUMBAR FUSION 4 LEVELS Right 07/03/2015   Procedure: Right Lumbar one-two, Lumbar two-three, Lumbar three-four, Lumbar four-five Anterior lateral lumbar interbody fusion;  Surgeon: Erline Levine, MD;  Location:  [place] NEURO ORS;  Service: Neurosurgery;  Laterality: Right;  Right L1-2 L2-3 L3-4 L4-5 Anterior lateral lumbar interbody fusion  . BALLOON DILATION N/A 03/03/2014   Procedure: BALLOON DILATION;  Surgeon: Inda Castle, MD;  Location: [facility] ENDOSCOPY;  Service: Endoscopy;  Laterality: N/A;  . BLADDER SUSPENSION    . BREAST BIOPSY    . CARDIAC CATHETERIZATION N/A 08/22/2016   Procedure: Left Heart Cath and Coronary Angiography;  Surgeon: Peter M Martinique, MD;   Location: [place] CV LAB;  Service: Cardiovascular;  Laterality: N/A;  . CARDIOVERSION N/A 08/01/2016   Procedure: CARDIOVERSION;  Surgeon: Jerline Pain, MD;  Location: [place];  Service: Cardiovascular;  Laterality: N/A;  . CARDIOVERSION N/A 10/11/2016   Procedure: CARDIOVERSION;  Surgeon: Sueanne Margarita, MD;  Location: [facility] ENDOSCOPY;  Service: Cardiovascular;  Laterality: N/A;  . COLONOSCOPY    . ESOPHAGOGASTRODUODENOSCOPY N/A 03/03/2014   Procedure: ESOPHAGOGASTRODUODENOSCOPY (EGD);  Surgeon: Inda Castle, MD;  Location: [facility];  Service: Endoscopy;  Laterality: N/A;  . EXCISION OF BASAL CELL CA  2011  . GLUTEUS MINIMUS REPAIR Right 03/15/2016   Procedure: RIGHT HIP GLUTEUS MEDIUS TENDON REPAIR;  Surgeon: Paralee Cancel, MD;  Location: [facility] ORS;  Service: Orthopedics;  Laterality: Right;  . ileostomy reversal    . LUMBAR PERCUTANEOUS PEDICLE SCREW 4 LEVEL Right 07/03/2015   Procedure: Lumbar one-Sacral one Bilateral percutaneous pedicle screws;  Surgeon: Erline Levine, MD;  Location: [place] NEURO ORS;  Service: Neurosurgery;  Laterality: Right;  L1-S1 Bilateral percutaneous pedicle screws  . RECTOCELE REPAIR  2008   A&P REPAIR -DR. MCDIARMID  . THUMB SURGERY Right 10-15 yrs ago  . UPPER GASTROINTESTINAL ENDOSCOPY    . VAGINAL HYSTERECTOMY  1983   NO BSO-DR. MABRY  . VARICOSE VEIN SURGERY       Current Outpatient Prescriptions  Medication Sig Dispense Refill  . albuterol (PROVENTIL HFA;VENTOLIN HFA) 108 (90 Base) MCG/ACT inhaler Inhale 1-2 puffs into the lungs every 6 (six) hours as needed for wheezing or shortness of breath. 3 Inhaler 4  . albuterol (PROVENTIL) (2.5 MG/3ML) 0.083% nebulizer solution Take 3 mLs (2.5 mg total) by nebulization every 6 (six) hours as needed for wheezing or shortness of breath. 1080 mL 3  . apixaban (ELIQUIS) 5 MG TABS tablet Take 1 tablet (5 mg total) by mouth 2 (two) times daily. 180 tablet 2  . azelastine (ASTELIN) 0.1 % nasal spray Place 2 sprays  into the nose daily. Use in each nostril as directed (Patient taking differently: Place 2 sprays into both nostrils daily. ) 90 mL 1  . budesonide-formoterol (SYMBICORT) 160-4.5 MCG/ACT inhaler Inhale 2 puffs into the lungs 2 (two) times daily. 3 Inhaler 4  . buPROPion (WELLBUTRIN XL) 300 MG 24 hr tablet Take 1 tablet (300 mg total) by mouth daily. 30 tablet 0  . Calcium Carbonate-Vitamin D (CALCIUM + D PO) Take 1 tablet by mouth 2 (two) times daily.     . cyclobenzaprine (FLEXERIL) 5 MG tablet Take 1 tablet (5 mg total) by mouth 3 (three) times daily as needed for muscle spasms. 270 tablet 0  . doxycycline (VIBRA-TABS) 100 MG tablet Take 1 tablet (100 mg total) by mouth 2 (two) times daily. 14 tablet 0  . ezetimibe (ZETIA) 10 MG tablet Take 1 tablet (10 mg total) by mouth daily. 90 tablet 3  . fluticasone (FLONASE) 50 MCG/ACT nasal spray Place 1 spray into both nostrils 2 (two) times daily. 48 g 1  . furosemide (LASIX) 20 MG  tablet Take 1 tablet (20 mg total) by mouth as needed (swelling). 90 tablet 0  . gabapentin (NEURONTIN) 300 MG capsule 1 tab by mouth in AM, 1 po at midday, then 2 by mouth at bedtime for sleep and pain 120 capsule 5  . guaiFENesin (MUCINEX) 600 MG 12 hr tablet Take 1,200 mg by mouth 2 (two) times daily as needed for cough or to loosen phlegm.    Marland Kitchen levothyroxine (SYNTHROID, LEVOTHROID) 25 MCG tablet Take 1 tablet (25 mcg total) by mouth daily before breakfast. 30 tablet 0  . Lidocaine HCl 2 % CREA Apply 1 application topically daily as needed (for shingles flares).    . LORazepam (ATIVAN) 0.5 MG tablet 1-2 tab by mouth at bedtime as needed for sleep 60 tablet 5  . Melatonin 5 MG TABS Take 5 mg by mouth at bedtime.    . metoprolol succinate (TOPROL-XL) 100 MG 24 hr tablet Take 1 tablet (100 mg total) by mouth daily. Take with or immediately following a meal. 90 tablet 3  . mirabegron ER (MYRBETRIQ) 50 MG TB24 tablet Take 50 mg by mouth daily.    . montelukast (SINGULAIR) 10 MG  tablet Take 1 tablet (10 mg total) by mouth daily. 90 tablet 1  . Multiple Vitamin (MULTIVITAMIN) tablet Take 1 tablet by mouth daily.      Marland Kitchen nystatin (MYCOSTATIN) 100000 UNIT/ML suspension Take 5 cc by mouth, swish and swallow 4 times daily. 280 mL 0  . omeprazole (PRILOSEC) 40 MG capsule Take 1 capsule (40 mg total) by mouth daily. 90 capsule 3  . predniSONE (DELTASONE) 10 MG tablet 2 tabs by mouth per day for 5 days 10 tablet 0  . Probiotic Product (PROBIOTIC DAILY PO) Take by mouth.    . Respiratory Therapy Supplies (FLUTTER) DEVI Use 2-3 times per day 1 each 0  . Spacer/Aero-Holding Chambers (AEROCHAMBER Z-STAT PLUS) inhaler Use as instructed 1 each 0  . Tiotropium Bromide Monohydrate (SPIRIVA RESPIMAT) 2.5 MCG/ACT AERS Inhale 2 puffs into the lungs daily. 3 Inhaler 3   No current facility-administered medications for this visit.     Allergies:   Clarithromycin; Lipitor [atorvastatin]; Simvastatin; Cardizem [diltiazem hcl]; Contrast media [iodinated diagnostic agents]; and Crestor [rosuvastatin calcium]   Social History:  The patient  reports that she quit smoking about 21 years ago. Her smoking use included Cigarettes. She started smoking about 62 years ago. She has a 126.00 pack-year smoking history. She has never used smokeless tobacco. She reports that she does not drink alcohol or use drugs.   Family History:  The patient's family history includes Breast cancer in her maternal aunt, maternal aunt, maternal aunt, and mother; COPD in her mother; Emphysema in her mother; Uterine cancer in her mother.    ROS:  Please see the history of present illness.   Otherwise, review of systems is positive for Patient's, cough, dyspnea on exertion, wheezing, depression, anxiety, balance problems, easy bruising.   All other systems are reviewed and negative.   PHYSICAL EXAM: VS:  BP 120/74   Pulse 70   Ht 5\' 4"  (1.626 m)   Wt 134 lb 3.2 oz (60.9 kg)   BMI 23.04 kg/m  , BMI Body mass index is  23.04 kg/m. GEN: Well nourished, well developed, in no acute distress  HEENT: normal  Neck: no JVD, carotid bruits, or masses Cardiac: RRR; no murmurs, rubs, or gallops,no edema  Respiratory:  clear to auscultation bilaterally, normal work of breathing GI: soft, nontender, nondistended, +  BS MS: no deformity or atrophy  Skin: warm and dry Neuro:  Strength and sensation are intact Psych: euthymic mood, full affect  EKG:  EKG is ordered today. Personal review of the ekg ordered shows sinus rhythm, rate 70, left anterior fascicular block, low voltage, possible septal infarct, PVCs   Recent Labs: 08/17/2016: B Natriuretic Peptide 343.5 09/13/2016: Pro B Natriuretic peptide (BNP) 202.0 09/16/2016: Magnesium 1.7 04/26/2017: ALT 15 05/25/2017: TSH 2.59 06/21/2017: BUN 19; Creatinine, Ser 1.34; Hemoglobin 11.2; Platelets 224.0; Potassium 4.4; Sodium 136    Lipid Panel     Component Value Date/Time   CHOL 190 04/26/2017 0801   TRIG 269.0 (H) 04/26/2017 0801   HDL 36.90 (L) 04/26/2017 0801   CHOLHDL 5 04/26/2017 0801   VLDL 53.8 (H) 04/26/2017 0801   LDLCALC 165 (H) 11/12/2015 1142   LDLDIRECT 110.0 04/26/2017 0801     Wt Readings from Last 3 Encounters:  07/04/17 134 lb 3.2 oz (60.9 kg)  06/22/17 132 lb (59.9 kg)  06/21/17 124 lb (56.2 kg)      Other studies Reviewed: Additional studies/ records that were reviewed today include: TTE 04/05/17, Cath 08/22/16, Telemetry 06/14/16  Review of the above records today demonstrates:  TTE - Left ventricle: The cavity size was normal. Wall thickness was   normal. Systolic function was mildly reduced. The estimated   ejection fraction was in the range of 45% to 50%. Diffuse   hypokinesis. Doppler parameters are consistent with abnormal left   ventricular relaxation (grade 1 diastolic dysfunction). Doppler   parameters are consistent with high ventricular filling pressure. - Aortic valve: There was mild stenosis. - Mitral valve: There was  moderate regurgitation. - Left atrium: The atrium was mildly dilated. - Pulmonary arteries: Systolic pressure was mildly increased. PA   peak pressure: 40 mm Hg (S).   Cath  Prox LAD to Mid LAD lesion, 15 %stenosed.  Ost Cx to Prox Cx lesion, 15 %stenosed.  Prox RCA to Mid RCA lesion, 20 %stenosed.  There is mild left ventricular systolic dysfunction.  LV end diastolic pressure is normal.  The left ventricular ejection fraction is 45-50% by visual estimate.   1. Minor nonobstructive CAD 2. Mild LV dysfunction with distal inferior HK 3. Measurement of LV pressures limited by frequent PVCs but EDP appears normal.   Telemetry 1. NSR with PVC's, at times in a bigeminal manner 2. Signal drop out as well as noise artifact are present.  3. No sustained atrial or ventricular arrhythmias 4. No pauses   ASSESSMENT AND PLAN:  1.  Paroxysmal atrial fibrillation: On liquids, Toprol-XL, and amiodarone. Tolerating medications well without issue. No changes at this time.  This patients CHA2DS2-VASc Score and unadjusted Ischemic Stroke Rate (% per year) is equal to 7.2 % stroke rate/year from a score of 5  Above score calculated as 1 point each if present [CHF, HTN, DM, Vascular=MI/PAD/Aortic Plaque, Age if 65-74, or Female] Above score calculated as 2 points each if present [Age > 75, or Stroke/TIA/TE]  2. Chronic combined systolic HF: Currently on Toprol-XL. No apparent volume overload. Fatigue could be due to decreased ejection fraction. I have told her that it is okay to go back to exercising without restriction. This may help with her fatigue.   3. HTN: Blood pressure is well-controlled today. No changes at this time.  4. PVCs: Low burden of PVCs unlikely to cause major issues. PVCs appear to be coming from the outflow tracts. No changes at this time.  Current medicines are reviewed at length with the patient today.   The patient does not have concerns regarding her medicines.   The following changes were made today:  none  Labs/ tests ordered today include:  Orders Placed This Encounter  Procedures  . EKG 12-Lead     Disposition:   FU with Louisa Favaro 6 months  Signed, Kaleya Douse Meredith Leeds, MD  07/04/2017 11:05 AM     [facility] [address] [place] [address] [phone] (office) [phone] (fax)

## 2017-07-04 NOTE — Patient Instructions (Signed)
Medication Instructions: Your physician recommends that you continue on your current medications as directed. Please refer to the Current Medication list given to you today.    Labwork: None ordered   Procedures/Testing: None ordered  Follow-Up: Your physician wants you to follow-up in: 6 months with Dr. Camnitz.   You will receive a reminder letter in the mail two months in advance. If you don't receive a letter, please call our office to schedule the follow-up appointment.   Any Additional Special Instructions Will Be Listed Below (If Applicable).     If you need a refill on your cardiac medications before your next appointment, please call your pharmacy.   

## 2017-07-05 ENCOUNTER — Other Ambulatory Visit: Payer: Medicare Other

## 2017-07-05 DIAGNOSIS — R05 Cough: Secondary | ICD-10-CM

## 2017-07-05 DIAGNOSIS — R059 Cough, unspecified: Secondary | ICD-10-CM

## 2017-07-05 DIAGNOSIS — J441 Chronic obstructive pulmonary disease with (acute) exacerbation: Secondary | ICD-10-CM

## 2017-07-07 ENCOUNTER — Telehealth: Payer: Self-pay | Admitting: Internal Medicine

## 2017-07-07 NOTE — Telephone Encounter (Signed)
Pt called in wants to know is she can stop the apixaban (ELIQUIS) 5 MG TABS tablet [[id number]]  4 day before her surgery.  She is having a spot take off her face.

## 2017-07-07 NOTE — Telephone Encounter (Signed)
Called pt, she has been informed and expressed understanding.

## 2017-07-07 NOTE — Telephone Encounter (Signed)
OK, but nees to start the next day post procedure

## 2017-07-08 LAB — RESPIRATORY CULTURE OR RESPIRATORY AND SPUTUM CULTURE

## 2017-07-11 NOTE — Telephone Encounter (Signed)
shirron to assist pt with getting appt please

## 2017-07-11 NOTE — Addendum Note (Signed)
Addended by: Biagio Borg on: 07/11/2017 01:01 PM   Modules accepted: Orders

## 2017-07-12 ENCOUNTER — Ambulatory Visit (INDEPENDENT_AMBULATORY_CARE_PROVIDER_SITE_OTHER): Payer: Medicare Other | Admitting: Family Medicine

## 2017-07-12 ENCOUNTER — Encounter: Payer: Self-pay | Admitting: Family Medicine

## 2017-07-12 VITALS — BP 126/70 | HR 68 | Temp 97.5°F | Ht 64.0 in | Wt 134.0 lb

## 2017-07-12 DIAGNOSIS — M7989 Other specified soft tissue disorders: Secondary | ICD-10-CM

## 2017-07-12 NOTE — Assessment & Plan Note (Addendum)
Unclear as a source of this fluid. It does not appear to be associated with the capsular and joint space. Does seem to be dependent that is worse at the end of the day. She is unable to take anti-inflammatories. Reviewed her lab work today. Denies any trauma to her foot so less likely for CRPS. Less likely as a clot as a source with her being anticoagulated. Possible for gout but does not have any crystalline deposits seen on ultrasound. Possible for an inclusion cyst versus neuroma. - Try compression with either Coban, midfoot arch strap or a compression sock - If there is no improvement in 2 weeks she can follow-up and can aspirate this.

## 2017-07-12 NOTE — Progress Notes (Signed)
[name] - 76 y.o. female MRN [id number]  Date of birth: [birth year]  SUBJECTIVE:  Including CC & ROS.  Chief Complaint  Patient presents with  . Foot Pain    X2 months. right foot pain and swelling, patient takes lasix end of the day foor is so swollen cannot wear shoes    [name] is a 76 year old female that is presenting with right dorsal foot swelling. She reports this started after she was admitted in June. She denies any trauma to her foot. The swelling is worse at the end of the day. She is not having any redness, ecchymosis or pain associated with it. She does have pain if she wears shoes or compress the area. She is also having some swelling around her ankle. She does not have this on the left foot. She does not have any calf swelling. She has been recently diagnosed with A. fib and is on anticoagulation. She is ambulating with a cane due to a surgery on her hip.     Review of Systems  Musculoskeletal: Positive for gait problem.       Foot swelling   Skin: Negative for rash.  Neurological: Negative for weakness.  otherwise negative   HISTORY: Past Medical, Surgical, Social, and Family History Reviewed & Updated per EMR.   Pertinent Historical Findings include:  Past Medical History:  Diagnosis Date  . Allergic rhinitis   . Anxiety   . Atrial septal aneurysm   . Basal cell carcinoma   . Bradycardia   . Cataract    bil cataracts removed  . Chronic combined systolic and diastolic CHF (congestive heart failure) ([place])   . CKD (chronic kidney disease), stage III   . Clostridium difficile infection   . COPD (chronic obstructive pulmonary disease) ([place])   . Depression 11/29/2013  . DI (detrusor instability)   . Esophageal stricture   . GERD (gastroesophageal reflux disease)   . Hemorrhoids   . Hiatal hernia   . History of kidney stones   . HLD (hyperlipidemia)   . HTN (hypertension)   . Ischemic colon ([place])    a. 08/2016 - adm to  [facility] with a  ruptured colon s/p right hemicolectomy with ileostomy formation for ischemic and necrotic colon.   . Lung nodule    a. followed by pulmonology  . Mild CAD    a. minor nonobstructive CAD 08/22/16.  . Nephrolithiasis    hx  . NICM (nonischemic cardiomyopathy) ([place])   . Orthostatic hypotension   . Osteoarthritis   . PAF (paroxysmal atrial fibrillation) ([place])    a. diagnosed 07/2016 s/p DCCV 08/01/16 with recurrence, managed with rate control for now.   . Pneumonia 2013  . PVC's (premature ventricular contractions)   . Shingles since Nov 18, 2013   on right arm from shoulder to wrist  . Status post dilation of esophageal narrowing   . Stroke [facility]) 2001   STROKES, TIA'S  . Syncope    a. 08/2016 felt due to orthostatic hypotension.  Marland Kitchen TIA (transient ischemic attack) last 2001   anticoagulation therapy on coumadin    Past Surgical History:  Procedure Laterality Date  . ANTERIOR AND POSTERIOR VAGINAL REPAIR  2008   A&P REPAIR-DR MCDIARMID  . ANTERIOR LATERAL LUMBAR FUSION 4 LEVELS Right 07/03/2015   Procedure: Right Lumbar one-two, Lumbar two-three, Lumbar three-four, Lumbar four-five Anterior lateral lumbar interbody fusion;  Surgeon: Erline Levine, MD;  Location: [facility] NEURO ORS;  Service: Neurosurgery;  Laterality: Right;  Right L1-2 L2-3 L3-4 L4-5 Anterior lateral lumbar interbody fusion  . BALLOON DILATION N/A 03/03/2014   Procedure: BALLOON DILATION;  Surgeon: Inda Castle, MD;  Location: [facility] ENDOSCOPY;  Service: Endoscopy;  Laterality: N/A;  . BLADDER SUSPENSION    . BREAST BIOPSY    . CARDIAC CATHETERIZATION N/A 08/22/2016   Procedure: Left Heart Cath and Coronary Angiography;  Surgeon: Peter M Martinique, MD;  Location: [facility] CV LAB;  Service: Cardiovascular;  Laterality: N/A;  . CARDIOVERSION N/A 08/01/2016   Procedure: CARDIOVERSION;  Surgeon: Jerline Pain, MD;  Location: [facility];  Service: Cardiovascular;  Laterality: N/A;  . CARDIOVERSION N/A 10/11/2016   Procedure:  CARDIOVERSION;  Surgeon: Sueanne Margarita, MD;  Location: [facility] ENDOS[facility];  Service: Cardiovascular;  Laterality: N/A;  . COLONOSCOPY    . ESOPHAGOGASTRODUODENOSCOPY N/A 03/03/2014   Procedure: ESOPHAGOGASTRODUODENOSCOPY (EGD);  Surgeon: Inda Castle, MD;  Location: [facility];  Service: Endoscopy;  Laterality: N/A;  . EXCISION OF BASAL CELL CA  2011  . GLUTEUS MINIMUS REPAIR Right 03/15/2016   Procedure: RIGHT HIP GLUTEUS MEDIUS TENDON REPAIR;  Surgeon: Paralee Cancel, MD;  Location: [facility] ORS;  Service: Orthopedics;  Laterality: Right;  . ileostomy reversal    . LUMBAR PERCUTANEOUS PEDICLE SCREW 4 LEVEL Right 07/03/2015   Procedure: Lumbar one-Sacral one Bilateral percutaneous pedicle screws;  Surgeon: Erline Levine, MD;  Location: [facility] NEURO ORS;  Service: Neurosurgery;  Laterality: Right;  L1-S1 Bilateral percutaneous pedicle screws  . RECTOCELE REPAIR  2008   A&P REPAIR -DR. MCDIARMID  . THUMB SURGERY Right 10-15 yrs ago  . UPPER GASTROINTESTINAL ENDOSCOPY    . VAGINAL HYSTERECTOMY  1983   NO BSO-DR. MABRY  . VARICOSE VEIN SURGERY      Allergies  Allergen Reactions  . Clarithromycin Other (See Comments)    REACTION: possible rash but could have been legionarres dz with rash  . Lipitor [Atorvastatin] Other (See Comments)    MUSCLE SPASMS  . Simvastatin Other (See Comments)    Muscle and joint pain  . Cardizem [Diltiazem Hcl] Hives  . Contrast Media [Iodinated Diagnostic Agents]     FYI - during admission at [facility] 08/2016 patient developed hives, question related to cardizem or contrast dye - not clear  . Crestor [Rosuvastatin Calcium]     fagitue and joint pain, "NO STATINS"    Family History  Problem Relation Age of Onset  . COPD Mother   . Breast cancer Mother        mid 36's  . Uterine cancer Mother   . Emphysema Mother   . Breast cancer Maternal Aunt        x 3 aunts, post menopausal  . Breast cancer Maternal Aunt   . Breast cancer Maternal Aunt   . Colon cancer  Neg Hx   . Esophageal cancer Neg Hx   . Rectal cancer Neg Hx   . Stomach cancer Neg Hx   . Pancreatic cancer Neg Hx      Social History   Social History  . Marital status: Married    Spouse name: Shanon Brow  . Number of children: 2  . Years of education: N/A   Occupational History  . retired Retired   Social History Main Topics  . Smoking status: Former Smoker    Packs/day: 3.00    Years: 42.00    Types: Cigarettes    Start date: 08/04/1954    Quit date: 12/06/1995  . Smokeless tobacco: Never Used  . Alcohol  use No     Comment: occas very little  . Drug use: No  . Sexual activity: Not Currently    Birth control/ protection: Surgical   Other Topics Concern  . Not on file   Social History Narrative   Retired- Dentist Pulmonary:   Originally from [place]. Has always lived in [place]. She has traveled to [place], [place], [place], [place], [place], [place]. Has been on multiple cruises. 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 She did use a face mask consistently to avoid chemical fume exposure. She may have only had an inhaled exposure a couple of times. She currently has a dog. Remote exposure to a parakeet. No mold or hot tub exposure. No asbestos exposure.      PHYSICAL EXAM:  VS: BP 126/70 (BP Location: Left Arm, Patient Position: Sitting, Cuff Size: Normal)   Pulse 68   Temp (!) 97.5 F (36.4 C) (Oral)   Ht 5\' 4"  (1.626 m)   Wt 134 lb (60.8 kg)   SpO2 99%   BMI 23.00 kg/m  Physical Exam Gen: NAD, alert, cooperative with exam, well-appearing ENT: normal lips, normal nasal mucosa,  Eye: normal EOM, normal conjunctiva and lids CV:  Normal rate, +2 pedal pulses   Resp: no accessory muscle use, non-labored,  Skin: no rashes, no areas of induration  Neuro: normal tone, normal sensation to touch Psych:  normal insight, alert and oriented MSK:  Right foot: Obvious  subcutaneous swelling of the dorsal midfoot. No overlying ecchymosis or redness. No tenderness to palpation. She has a pes cavus foot Normal ankle range of motion. Has some swelling around her ankles but nothing extending up her lower extremity. Neurovascularly intact  Limited ultrasound: Right foot:  Subcutaneous edema observed in the dorsal midfoot that does not seem to extend from any joint or capsule. The posterior tib and the peroneal tendons were normal in appearance. The metatarsals were normal in appearance.  Summary: Findings are consistent with subcutaneous edema.  Ultrasound and interpretation by Clearance Coots, MD      ASSESSMENT & PLAN:   Foot swelling Unclear as a source of this fluid. It does not appear to be associated with the capsular and joint space. Does seem to be dependent that is worse at the end of the day. She is unable to take anti-inflammatories. Reviewed her lab work today. Denies any trauma to her foot so less likely for CRPS. Less likely as a clot as a source with her being anticoagulated. Possible for gout but does not have any crystalline deposits seen on ultrasound. Possible for an inclusion cyst versus neuroma. - Try compression with either Coban, midfoot arch strap or a compression sock - If there is no improvement in 2 weeks she can follow-up and can aspirate this.

## 2017-07-12 NOTE — Patient Instructions (Addendum)
Thank you for coming in,   Please try wrapping it or the midfoot arch strap. If these do not improve your symptoms and please follow-up with me in a couple weeks. We can consider draining and at that time.   Please feel free to call with any questions or concerns at any time, at [phone]. --Dr. Raeford Razor

## 2017-07-26 DIAGNOSIS — C44319 Basal cell carcinoma of skin of other parts of face: Secondary | ICD-10-CM | POA: Diagnosis not present

## 2017-08-03 LAB — FUNGUS CULTURE W SMEAR

## 2017-08-05 ENCOUNTER — Encounter: Payer: Self-pay | Admitting: Pulmonary Disease

## 2017-08-08 MED ORDER — FLUTICASONE PROPIONATE 50 MCG/ACT NA SUSP: 1.0000 | Freq: Two times a day (BID) | NASAL | 1 refills | Status: DC

## 2017-08-11 ENCOUNTER — Ambulatory Visit: Payer: Medicare Other | Admitting: Pulmonary Disease

## 2017-08-12 ENCOUNTER — Encounter: Payer: Self-pay | Admitting: Internal Medicine

## 2017-08-14 MED ORDER — FUROSEMIDE 20 MG PO TABS: 20.0000 mg | ORAL_TABLET | ORAL | 0 refills | Status: DC | PRN

## 2017-08-14 MED ORDER — EZETIMIBE 10 MG PO TABS: 10.0000 mg | ORAL_TABLET | Freq: Every day | ORAL | 0 refills | Status: DC

## 2017-08-21 LAB — AFB CULTURE WITH SMEAR (NOT AT ARMC)

## 2017-08-22 ENCOUNTER — Ambulatory Visit (INDEPENDENT_AMBULATORY_CARE_PROVIDER_SITE_OTHER): Payer: Medicare Other | Admitting: Pulmonary Disease

## 2017-08-22 ENCOUNTER — Encounter: Payer: Self-pay | Admitting: Pulmonary Disease

## 2017-08-22 VITALS — BP 128/78 | HR 76 | Ht 64.0 in | Wt 138.0 lb

## 2017-08-22 DIAGNOSIS — J309 Allergic rhinitis, unspecified: Secondary | ICD-10-CM | POA: Diagnosis not present

## 2017-08-22 DIAGNOSIS — K219 Gastro-esophageal reflux disease without esophagitis: Secondary | ICD-10-CM

## 2017-08-22 DIAGNOSIS — R499 Unspecified voice and resonance disorder: Secondary | ICD-10-CM | POA: Diagnosis not present

## 2017-08-22 DIAGNOSIS — Z23 Encounter for immunization: Secondary | ICD-10-CM

## 2017-08-22 DIAGNOSIS — J449 Chronic obstructive pulmonary disease, unspecified: Secondary | ICD-10-CM

## 2017-08-22 MED ORDER — RANITIDINE HCL 150 MG PO TABS: 150.0000 mg | ORAL_TABLET | Freq: Every day | ORAL | 3 refills | Status: DC

## 2017-08-22 NOTE — Patient Instructions (Addendum)
   Continue using your inhalers and medications as prescribed.  Call me if your voice isn't getting better after being on Zantac for a couple of weeks. We may need to have ENT see you if that happens.   We will see you back in 3 months or sooner if needed.

## 2017-08-22 NOTE — Progress Notes (Signed)
Subjective:    Patient ID: [name], female    DOB: [birth year], 76 y.o.   MRN: [id number]  C.C.:  Follow-up for Moderate COPD, Chronic Allergic Rhinitis, & GERD w/ Hiatal Hernia.  HPI  Previously treated for pneumonia at [facility]. Last seen in July for COPD exacerbation. Patient recommended to use Mucinex and was also prescribed prednisone 20 mg daily for 5 days. After brief phone call to me doxycycline for 7 days was also prescribed.  Moderate COPD: Prescribed Symbicort and Spiriva. She denies any coughing. She reports she has been going to the gym twice weekly for the last couple of weeks but she does have dyspnea on exertion. She denies any wheezing. She is using her rescue medication twice daily at most and more when she exercises.   Chronic allergic rhinitis: Prescribed Singulair, Astelin nasal spray, and Flonase. She denies any post-nasal drainage, pressure, or pain.  GERD with vital hernia: Followed by GI. Previously prescribed Prilosec. No reflux, dyspepsia, or morning brash water taste.   Review of Systems  She reports she has had problems loosing her voice intermittently. This is mostly in the morning and has been happening for approximately 2 weeks. She does clear her throat intermittently. No chest pain, pressure, or tightness. No fever or chills. No rashes but does bruise easily.   Allergies  Allergen Reactions  . Clarithromycin Other (See Comments)    REACTION: possible rash but could have been legionarres dz with rash  . Lipitor [Atorvastatin] Other (See Comments)    MUSCLE SPASMS  . Simvastatin Other (See Comments)    Muscle and joint pain  . Cardizem [Diltiazem Hcl] Hives  . Contrast Media [Iodinated Diagnostic Agents]     FYI - during admission at [facility] 08/2016 patient developed hives, question related to cardizem or contrast dye - not clear  . Crestor [Rosuvastatin Calcium]     fagitue and joint pain, "NO STATINS"    Current Outpatient  Prescriptions on File Prior to Visit  Medication Sig Dispense Refill  . albuterol (PROVENTIL HFA;VENTOLIN HFA) 108 (90 Base) MCG/ACT inhaler Inhale 1-2 puffs into the lungs every 6 (six) hours as needed for wheezing or shortness of breath. 3 Inhaler 4  . albuterol (PROVENTIL) (2.5 MG/3ML) 0.083% nebulizer solution Take 3 mLs (2.5 mg total) by nebulization every 6 (six) hours as needed for wheezing or shortness of breath. 1080 mL 3  . apixaban (ELIQUIS) 5 MG TABS tablet Take 1 tablet (5 mg total) by mouth 2 (two) times daily. 180 tablet 2  . azelastine (ASTELIN) 0.1 % nasal spray Place 2 sprays into the nose daily. Use in each nostril as directed (Patient taking differently: Place 2 sprays into both nostrils daily. ) 90 mL 1  . budesonide-formoterol (SYMBICORT) 160-4.5 MCG/ACT inhaler Inhale 2 puffs into the lungs 2 (two) times daily. 3 Inhaler 4  . buPROPion (WELLBUTRIN XL) 300 MG 24 hr tablet Take 1 tablet (300 mg total) by mouth daily. 30 tablet 0  . Calcium Carbonate-Vitamin D (CALCIUM + D PO) Take 1 tablet by mouth 2 (two) times daily.     . cyclobenzaprine (FLEXERIL) 5 MG tablet Take 1 tablet (5 mg total) by mouth 3 (three) times daily as needed for muscle spasms. 270 tablet 0  . ezetimibe (ZETIA) 10 MG tablet Take 1 tablet (10 mg total) by mouth daily. 90 tablet 0  . fluticasone (FLONASE) 50 MCG/ACT nasal spray Place 1 spray into both nostrils 2 (two) times daily. 48 g  1  . furosemide (LASIX) 20 MG tablet Take 1 tablet (20 mg total) by mouth as needed (swelling). 90 tablet 0  . gabapentin (NEURONTIN) 300 MG capsule 1 tab by mouth in AM, 1 po at midday, then 2 by mouth at bedtime for sleep and pain 120 capsule 5  . guaiFENesin (MUCINEX) 600 MG 12 hr tablet Take 1,200 mg by mouth 2 (two) times daily as needed for cough or to loosen phlegm.    Marland Kitchen levothyroxine (SYNTHROID, LEVOTHROID) 25 MCG tablet Take 1 tablet (25 mcg total) by mouth daily before breakfast. 30 tablet 0  . Lidocaine HCl 2 % CREA  Apply 1 application topically daily as needed (for shingles flares).    . LORazepam (ATIVAN) 0.5 MG tablet 1-2 tab by mouth at bedtime as needed for sleep 60 tablet 5  . Melatonin 5 MG TABS Take 5 mg by mouth at bedtime.    . mirabegron ER (MYRBETRIQ) 50 MG TB24 tablet Take 50 mg by mouth daily.    . montelukast (SINGULAIR) 10 MG tablet Take 1 tablet (10 mg total) by mouth daily. 90 tablet 1  . Multiple Vitamin (MULTIVITAMIN) tablet Take 1 tablet by mouth daily.      Marland Kitchen nystatin (MYCOSTATIN) 100000 UNIT/ML suspension Take 5 cc by mouth, swish and swallow 4 times daily. 280 mL 0  . omeprazole (PRILOSEC) 40 MG capsule Take 1 capsule (40 mg total) by mouth daily. 90 capsule 3  . Probiotic Product (PROBIOTIC DAILY PO) Take by mouth.    . Respiratory Therapy Supplies (FLUTTER) DEVI Use 2-3 times per day 1 each 0  . Spacer/Aero-Holding Chambers (AEROCHAMBER Z-STAT PLUS) inhaler Use as instructed 1 each 0  . Tiotropium Bromide Monohydrate (SPIRIVA RESPIMAT) 2.5 MCG/ACT AERS Inhale 2 puffs into the lungs daily. 3 Inhaler 3  . metoprolol succinate (TOPROL-XL) 100 MG 24 hr tablet Take 1 tablet (100 mg total) by mouth daily. Take with or immediately following a meal. 90 tablet 3  . [DISCONTINUED] valsartan (DIOVAN) 160 MG tablet Take 1 tablet (160 mg total) by mouth daily. 90 tablet 3   No current facility-administered medications on file prior to visit.     Past Medical History:  Diagnosis Date  . Allergic rhinitis   . Anxiety   . Atrial septal aneurysm   . Basal cell carcinoma   . Bradycardia   . Cataract    bil cataracts removed  . Chronic combined systolic and diastolic CHF (congestive heart failure) ([place])   . CKD (chronic kidney disease), stage III   . Clostridium difficile infection   . COPD (chronic obstructive pulmonary disease) ([place])   . Depression 11/29/2013  . DI (detrusor instability)   . Esophageal stricture   . GERD (gastroesophageal reflux disease)   . Hemorrhoids   . Hiatal  hernia   . History of kidney stones   . HLD (hyperlipidemia)   . HTN (hypertension)   . Ischemic colon ([place])    a. 08/2016 - adm to  [facility] with a ruptured colon s/p right hemicolectomy with ileostomy formation for ischemic and necrotic colon.   . Lung nodule    a. followed by pulmonology  . Mild CAD    a. minor nonobstructive CAD 08/22/16.  . Nephrolithiasis    hx  . NICM (nonischemic cardiomyopathy) ([place])   . Orthostatic hypotension   . Osteoarthritis   . PAF (paroxysmal atrial fibrillation) ([place])    a. diagnosed 07/2016 s/p DCCV 08/01/16 with recurrence, managed with rate control  for now.   . Pneumonia 2013  . PVC's (premature ventricular contractions)   . Shingles since Nov 18, 2013   on right arm from shoulder to wrist  . Status post dilation of esophageal narrowing   . Stroke [facility]) 2001   STROKES, TIA'S  . Syncope    a. 08/2016 felt due to orthostatic hypotension.  Marland Kitchen TIA (transient ischemic attack) last 2001   anticoagulation therapy on coumadin    Past Surgical History:  Procedure Laterality Date  . ANTERIOR AND POSTERIOR VAGINAL REPAIR  2008   A&P REPAIR-DR MCDIARMID  . ANTERIOR LATERAL LUMBAR FUSION 4 LEVELS Right 07/03/2015   Procedure: Right Lumbar one-two, Lumbar two-three, Lumbar three-four, Lumbar four-five Anterior lateral lumbar interbody fusion;  Surgeon: Erline Levine, MD;  Location: [place] NEURO ORS;  Service: Neurosurgery;  Laterality: Right;  Right L1-2 L2-3 L3-4 L4-5 Anterior lateral lumbar interbody fusion  . BALLOON DILATION N/A 03/03/2014   Procedure: BALLOON DILATION;  Surgeon: Inda Castle, MD;  Location: [facility] ENDOSCOPY;  Service: Endoscopy;  Laterality: N/A;  . BLADDER SUSPENSION    . BREAST BIOPSY    . CARDIAC CATHETERIZATION N/A 08/22/2016   Procedure: Left Heart Cath and Coronary Angiography;  Surgeon: Peter M Martinique, MD;  Location: [place] CV LAB;  Service: Cardiovascular;  Laterality: N/A;  . CARDIOVERSION N/A 08/01/2016   Procedure:  CARDIOVERSION;  Surgeon: Jerline Pain, MD;  Location: [facility];  Service: Cardiovascular;  Laterality: N/A;  . CARDIOVERSION N/A 10/11/2016   Procedure: CARDIOVERSION;  Surgeon: Sueanne Margarita, MD;  Location: [facility] ENDOS[facility];  Service: Cardiovascular;  Laterality: N/A;  . COLONOSCOPY    . ESOPHAGOGASTRODUODENOSCOPY N/A 03/03/2014   Procedure: ESOPHAGOGASTRODUODENOSCOPY (EGD);  Surgeon: Inda Castle, MD;  Location: [facility];  Service: Endoscopy;  Laterality: N/A;  . EXCISION OF BASAL CELL CA  2011  . GLUTEUS MINIMUS REPAIR Right 03/15/2016   Procedure: RIGHT HIP GLUTEUS MEDIUS TENDON REPAIR;  Surgeon: Paralee Cancel, MD;  Location: [facility] ORS;  Service: Orthopedics;  Laterality: Right;  . ileostomy reversal    . LUMBAR PERCUTANEOUS PEDICLE SCREW 4 LEVEL Right 07/03/2015   Procedure: Lumbar one-Sacral one Bilateral percutaneous pedicle screws;  Surgeon: Erline Levine, MD;  Location: [place] NEURO ORS;  Service: Neurosurgery;  Laterality: Right;  L1-S1 Bilateral percutaneous pedicle screws  . RECTOCELE REPAIR  2008   A&P REPAIR -DR. MCDIARMID  . THUMB SURGERY Right 10-15 yrs ago  . UPPER GASTROINTESTINAL ENDOSCOPY    . VAGINAL HYSTERECTOMY  1983   NO BSO-DR. MABRY  . VARICOSE VEIN SURGERY      Family History  Problem Relation Age of Onset  . COPD Mother   . Breast cancer Mother        mid 66's  . Uterine cancer Mother   . Emphysema Mother   . Breast cancer Maternal Aunt        x 3 aunts, post menopausal  . Breast cancer Maternal Aunt   . Breast cancer Maternal Aunt   . Colon cancer Neg Hx   . Esophageal cancer Neg Hx   . Rectal cancer Neg Hx   . Stomach cancer Neg Hx   . Pancreatic cancer Neg Hx     Social History   Social History  . Marital status: Married    Spouse name: Shanon Brow  . Number of children: 2  . Years of education: N/A   Occupational History  . retired Retired   Social History Main Topics  . Smoking status: Former Smoker  Packs/day: 3.00    Years: 42.00     Types: Cigarettes    Start date: 08/04/1954    Quit date: 12/06/1995  . Smokeless tobacco: Never Used  . Alcohol use No     Comment: occas very little  . Drug use: No  . Sexual activity: Not Currently    Birth control/ protection: Surgical   Other Topics Concern  . None   Social History Narrative   Retired- Dentist Pulmonary:   Originally from [place]. Has always lived in [place]. She has traveled to [place], [place], [place], [place], [place], [place]. Has been on multiple cruises. 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 She did use a face mask consistently to avoid chemical fume exposure. She may have only had an inhaled exposure a couple of times. She currently has a dog. Remote exposure to a parakeet. No mold or hot tub exposure. No asbestos exposure.       Objective:   Physical Exam BP 128/78 (BP Location: Left Arm, Cuff Size: Normal)   Pulse 76   Ht 5\' 4"  (1.626 m)   Wt 138 lb (62.6 kg)   SpO2 93%   BMI 23.69 kg/m   General:  Awake. Accompanied by husband today. No distress. Integument:  Warm & dry. No rash on exposed skin. Minimal bruising on exposed skin of upper extremities. Extremities:  No cyanosis or clubbing.  HEENT:  Moist mucus membranes. No nasal turbinate swelling. No oral ulcers. Cardiovascular:  Regular rate. No edema. Unable to appreciate JVD.  Pulmonary:  Good aeration bilaterally. Clear with auscultation. Normal work of breathing. Abdomen: Soft. Normal bowel sounds. Mildly protuberant. Musculoskeletal:  Normal bulk and tone. No joint deformity or effusion appreciated.  PFT 10/10/16: FVC 1.94 L (70%) FEV1 0.93 L (45%) FEV1/FVC 0.48 FEF 25-75 0.35 L (22%) positive bronchodilator response 03/01/16: FVC 2.55 L (91%) FEV1 1.30 L (62%) FEV1/FVC 0.51 FEF 25-75 0.56 L (34%) negative bronchodilator response TLC 5.66 L (111%) RV 116% ERV 133% DLCO uncorrected 62%  (hemoglobin 15.1) 12/28/11:FVC 2.89 L (104%) FEV1 1.22 L (62%) FEV1/FVC 0.42 FEF 25-75 0.33 L (15%) negative bronchodilator response TLC 6.19 L (129%) RV 163% ERV 114% DLCO uncorrected 69%  6MWT 10/10/16:  Walked 96 meters / Baseline Sat 93% on RA / Nadir Sat 93% on RA @ rest (couldn't finish due to weakness & dyspnea  W/ 2:57 left) 08/24/16:  Walked 1 lap & was 96% at lowest on room air  IMAGING CXR PA/LAT 06/19/17 (personally reviewed by me):  Hyperinflation suggested by flattening of the diaphragms. Hiatal hernia noted. Resolution of right lung opacity. No new opacity or effusion appreciated. Heart normal in size & mediastinum normal in contour.  CXR PA/LAT 05/18/17 (previously reviewed by me):  Patchy opacification in right middle and lower lobes. No pleural effusion appreciated. Heart normal in size & mediastinum normal in contour.  CT CHEST W/O 01/23/17 (previously reviewed by me): Resolution of groundglass opacity. 7 mm right lower lobe nodule unchanged. Apical predominant emphysematous changes. Right middle lobe nodule was not appreciated on this image. No pleural effusion or thickening. No pathologic mediastinal adenopathy. No pericardial effusion.  CTA CHEST 08/17/16 (previously reviewed by me):  No evidence of PE. No pleural effusion or thickening. No pathologic mediastinal adenopathy. Groundglass noted within right lung predominantly in the upper lung zone. Moderate upper lobe predominant emphysema which has progressed since 2015. 7  mm right lower lobe nodule has not changed since 2015. Patient has a 4 mm nodule within right middle lobe which appears new.  CXR PA/LAT 05/25/16 (previously reviewed by me): Hyperinflation with flattening of the diaphragms. No parenchymal opacity or mass appreciated. No pleural effusion. Heart normal in size & mediastinum normal in contour.  CXR PA/LAT 03/22/16 (previously reviewed by me):  Hyperinflation with elevation of left hemidiaphragm. No new focal  opacity. Heart normal in size & mediastinum normal in contour.  CXR PA/LAT 11/09/15 (previously reviewed by me): No focal opacity or effusion appreciated. Heart normal in size. Mediastinum normal in contour. Mild hyperinflation with flattening of the diaphragms.   CARDIAC TTE (06/24/16): LVEF 45-50% with grade 2 diastolic dysfunction. LA moderately to severely dilated. RA normal in size. RV normal in size and function. No aortic stenosis or regurgitation. Moderate centrally directed mitral regurgitation. Paradoxical ventricular septal motion consistent with intraventricular conduction delay. No pulmonic regurgitation. No tricuspid regurgitation. No pericardial effusion.  MICROBIOLOGY Sputum Culture (07/05/17):  Candida Species / AFB negative / Moderate Squamous Epithelials   LABS 12/22/15 Alpha-1 antitrypsin: MM (143)    Assessment & Plan:  76 y.o. female with underlying moderate COPD, chronic allergic rhinitis, & GERD with hiatal hernia. Patient seems recovered from her pneumonia. Her COPD is well controlled. Reviewing her previous x-ray imaging shows resolution of her right lung opacity and pneumonia. I do believe the patient's voice changes could be secondary to silent laryngo-esophageal reflux given her history of hiatal hernia. I see no evidence of postnasal drainage on physical exam today that would suggest her allergic rhinitis is suboptimally controlled. I did caution the patient that direct laryngoscopy by ENT evaluation may be necessary if her symptoms do not improve with further gastric acid suppression. I instructed the patient to contact my office if she had any new breathing problems or questions before her next appointment.  1. Moderate COPD:  Well-controlled. Continuing Symbicort and Spiriva. No changes. 2. Chronic allergic rhinitis:  Well-controlled. Continuing current regimen of Singulair, Flonase, and Astelin. 3. GERD with hiatal hernia:  Continuing Prilosec. Starting Zantac 150 mg  by mouth daily at bedtime. Patient recommended to avoid eating within 2 hours of bedtime. 4. Voice changes: Increasing gastric acid suppression. She will contact me if this is ineffective as I would then refer her for ENT evaluation. 5. Health maintenance: Status post Prevnar December 2014 & Pneumovax January 2008. Administering high-dose influenza vaccine today. 6. Follow-up: Return to clinic in 3 months or sooner if needed.  Sonia Baller Ashok Cordia, M.D. [facility] Pulmonary & Critical Care Pager:  [phone] After 3pm or if no response, call [phone] 2:16 PM 08/22/17

## 2017-08-23 ENCOUNTER — Telehealth: Payer: Self-pay | Admitting: Pulmonary Disease

## 2017-08-23 MED ORDER — RANITIDINE HCL 150 MG PO TABS: 150.0000 mg | ORAL_TABLET | Freq: Every day | ORAL | 3 refills | Status: DC

## 2017-08-23 NOTE — Telephone Encounter (Signed)
Spoke with patient. She stated that she was supposed to have a RX for Zantac sent in to [facility] in [place] yesterday after her OV. The RX was sent to [facility] instead. Advised pt that I would sent in the RX to the right pharmacy. She verbalized understanding. Nothing else needed at time of call.

## 2017-08-27 ENCOUNTER — Encounter: Payer: Self-pay | Admitting: Pulmonary Disease

## 2017-08-28 ENCOUNTER — Encounter: Payer: Self-pay | Admitting: Family Medicine

## 2017-08-28 ENCOUNTER — Telehealth: Payer: Self-pay | Admitting: Pulmonary Disease

## 2017-08-28 ENCOUNTER — Ambulatory Visit (INDEPENDENT_AMBULATORY_CARE_PROVIDER_SITE_OTHER): Payer: Medicare Other | Admitting: Family Medicine

## 2017-08-28 ENCOUNTER — Ambulatory Visit (INDEPENDENT_AMBULATORY_CARE_PROVIDER_SITE_OTHER)
Admission: RE | Admit: 2017-08-28 | Discharge: 2017-08-28 | Disposition: A | Discharge status: Home / Self Care | Payer: Medicare Other | Source: Ambulatory Visit | Attending: Family Medicine | Admitting: Family Medicine

## 2017-08-28 VITALS — BP 132/60 | HR 69 | Temp 97.9°F | Ht 64.0 in | Wt 140.2 lb

## 2017-08-28 DIAGNOSIS — M2140 Flat foot [pes planus] (acquired), unspecified foot: Secondary | ICD-10-CM

## 2017-08-28 DIAGNOSIS — M7989 Other specified soft tissue disorders: Secondary | ICD-10-CM | POA: Diagnosis not present

## 2017-08-28 DIAGNOSIS — M76829 Posterior tibial tendinitis, unspecified leg: Secondary | ICD-10-CM | POA: Insufficient documentation

## 2017-08-28 DIAGNOSIS — R490 Dysphonia: Secondary | ICD-10-CM

## 2017-08-28 NOTE — Telephone Encounter (Signed)
Per Dr. Ashok Cordia, placing an order for ENT. Patient is still having trouble losing her voice.

## 2017-08-28 NOTE — Assessment & Plan Note (Signed)
The structure of her foot has likely led to this problem. Unclear she has had a distant history as to why her right foot is so structural different than her left. She reports having a tear for right gluteus medius and which was surgically repaired but did not do well. - X-rays of her foot - Advised compression provided home exercises. Counseled on these. - We will refer for custom orthotics - We will provide Pennsaid when it is available next

## 2017-08-28 NOTE — Assessment & Plan Note (Signed)
This has resolved with compression - Follow-up as needed.

## 2017-08-28 NOTE — Progress Notes (Signed)
[name] - 76 y.o. female MRN [id number]  Date of birth: 10-Aug-1941  SUBJECTIVE:  Including CC & ROS.  Chief Complaint  Patient presents with  . Follow-up    Patient is here today to F/U with right foot.  Her ankle is swollen and it is starting to hurt. Denies any injury.    [name] is a 76 year old female that is presenting with right medial foot pain. She also reports resolution of her swelling that was occurring on her foot. The right medial foot pain has been ongoing for a few weeks now. She denies any injury or inciting event. She reports that her foot has been structurally changed for some time. She has a history of a right gluteus medius tear in which she had surgery for this. She has swelling in this area as well. The pain is worse with prolonged walking. The pain is throbbing in nature.     Review of Systems  Musculoskeletal: Positive for gait problem. Negative for joint swelling.  Skin: Negative for color change.  Neurological: Negative for weakness and numbness.  Hematological: Negative for adenopathy.    HISTORY: Past Medical, Surgical, Social, and Family History Reviewed & Updated per EMR.   Pertinent Historical Findings include:  Past Medical History:  Diagnosis Date  . Allergic rhinitis   . Anxiety   . Atrial septal aneurysm   . Basal cell carcinoma   . Bradycardia   . Cataract    bil cataracts removed  . Chronic combined systolic and diastolic CHF (congestive heart failure) ([place])   . CKD (chronic kidney disease), stage III   . Clostridium difficile infection   . COPD (chronic obstructive pulmonary disease) ([place])   . Depression 11/29/2013  . DI (detrusor instability)   . Esophageal stricture   . GERD (gastroesophageal reflux disease)   . Hemorrhoids   . Hiatal hernia   . History of kidney stones   . HLD (hyperlipidemia)   . HTN (hypertension)   . Ischemic colon ([place])    a. 08/2016 - adm to  [facility] with a ruptured colon s/p right  hemicolectomy with ileostomy formation for ischemic and necrotic colon.   . Lung nodule    a. followed by pulmonology  . Mild CAD    a. minor nonobstructive CAD 08/22/16.  . Nephrolithiasis    hx  . NICM (nonischemic cardiomyopathy) ([place])   . Orthostatic hypotension   . Osteoarthritis   . PAF (paroxysmal atrial fibrillation) ([place])    a. diagnosed 07/2016 s/p DCCV 08/01/16 with recurrence, managed with rate control for now.   . Pneumonia 2013  . PVC's (premature ventricular contractions)   . Shingles since Nov 18, 2013   on right arm from shoulder to wrist  . Status post dilation of esophageal narrowing   . Stroke [facility]) 2001   STROKES, TIA'S  . Syncope    a. 08/2016 felt due to orthostatic hypotension.  Marland Kitchen TIA (transient ischemic attack) last 2001   anticoagulation therapy on coumadin    Past Surgical History:  Procedure Laterality Date  . ANTERIOR AND POSTERIOR VAGINAL REPAIR  2008   A&P REPAIR-DR MCDIARMID  . ANTERIOR LATERAL LUMBAR FUSION 4 LEVELS Right 07/03/2015   Procedure: Right Lumbar one-two, Lumbar two-three, Lumbar three-four, Lumbar four-five Anterior lateral lumbar interbody fusion;  Surgeon: Erline Levine, MD;  Location: [place] NEURO ORS;  Service: Neurosurgery;  Laterality: Right;  Right L1-2 L2-3 L3-4 L4-5 Anterior lateral lumbar interbody fusion  . BALLOON DILATION N/A 03/03/2014  Procedure: BALLOON DILATION;  Surgeon: Inda Castle, MD;  Location: [facility];  Service: Endoscopy;  Laterality: N/A;  . BLADDER SUSPENSION    . BREAST BIOPSY    . CARDIAC CATHETERIZATION N/A 08/22/2016   Procedure: Left Heart Cath and Coronary Angiography;  Surgeon: Peter M Martinique, MD;  Location: [place] CV LAB;  Service: Cardiovascular;  Laterality: N/A;  . CARDIOVERSION N/A 08/01/2016   Procedure: CARDIOVERSION;  Surgeon: Jerline Pain, MD;  Location: [place];  Service: Cardiovascular;  Laterality: N/A;  . CARDIOVERSION N/A 10/11/2016   Procedure: CARDIOVERSION;  Surgeon: Sueanne Margarita, MD;  Location: [facility] ENDOSCOPY;  Service: Cardiovascular;  Laterality: N/A;  . COLONOSCOPY    . ESOPHAGOGASTRODUODENOSCOPY N/A 03/03/2014   Procedure: ESOPHAGOGASTRODUODENOSCOPY (EGD);  Surgeon: Inda Castle, MD;  Location: [facility];  Service: Endoscopy;  Laterality: N/A;  . EXCISION OF BASAL CELL CA  2011  . GLUTEUS MINIMUS REPAIR Right 03/15/2016   Procedure: RIGHT HIP GLUTEUS MEDIUS TENDON REPAIR;  Surgeon: Paralee Cancel, MD;  Location: [facility];  Service: Orthopedics;  Laterality: Right;  . ileostomy reversal    . LUMBAR PERCUTANEOUS PEDICLE SCREW 4 LEVEL Right 07/03/2015   Procedure: Lumbar one-Sacral one Bilateral percutaneous pedicle screws;  Surgeon: Erline Levine, MD;  Location: [place] NEURO ORS;  Service: Neurosurgery;  Laterality: Right;  L1-S1 Bilateral percutaneous pedicle screws  . RECTOCELE REPAIR  2008   A&P REPAIR -DR. MCDIARMID  . THUMB SURGERY Right 10-15 yrs ago  . UPPER GASTROINTESTINAL ENDOSCOPY    . VAGINAL HYSTERECTOMY  1983   NO BSO-DR. MABRY  . VARICOSE VEIN SURGERY      Allergies  Allergen Reactions  . Clarithromycin Other (See Comments)    REACTION: possible rash but could have been legionarres dz with rash  . Lipitor [Atorvastatin] Other (See Comments)    MUSCLE SPASMS  . Simvastatin Other (See Comments)    Muscle and joint pain  . Cardizem [Diltiazem Hcl] Hives  . Contrast Media [Iodinated Diagnostic Agents]     FYI - during admission at [facility] 08/2016 patient developed hives, question related to cardizem or contrast dye - not clear  . Crestor [Rosuvastatin Calcium]     fagitue and joint pain, "NO STATINS"    Family History  Problem Relation Age of Onset  . COPD Mother   . Breast cancer Mother        mid 3's  . Uterine cancer Mother   . Emphysema Mother   . Breast cancer Maternal Aunt        x 3 aunts, post menopausal  . Breast cancer Maternal Aunt   . Breast cancer Maternal Aunt   . Colon cancer Neg Hx   . Esophageal cancer Neg  Hx   . Rectal cancer Neg Hx   . Stomach cancer Neg Hx   . Pancreatic cancer Neg Hx      Social History   Social History  . Marital status: Married    Spouse name: Shanon Brow  . Number of children: 2  . Years of education: N/A   Occupational History  . retired Retired   Social History Main Topics  . Smoking status: Former Smoker    Packs/day: 3.00    Years: 42.00    Types: Cigarettes    Start date: 08/04/1954    Quit date: 12/06/1995  . Smokeless tobacco: Never Used  . Alcohol use No     Comment: occas very little  . Drug use: No  . Sexual  activity: Not Currently    Birth control/ protection: Surgical   Other Topics Concern  . Not on file   Social History Narrative   Retired- Dentist Pulmonary:   Originally from [place]. Has always lived in [place]. She has traveled to [place], [place], [place], [place], [place], [place]. Has been on multiple cruises. 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 She did use a face mask consistently to avoid chemical fume exposure. She may have only had an inhaled exposure a couple of times. She currently has a dog. Remote exposure to a parakeet. No mold or hot tub exposure. No asbestos exposure.      PHYSICAL EXAM:  VS: BP 132/60 (BP Location: Left Arm, Patient Position: Sitting, Cuff Size: Normal)   Pulse 69   Temp 97.9 F (36.6 C) (Oral)   Ht 5\' 4"  (1.626 m)   Wt 140 lb 3.2 oz (63.6 kg)   SpO2 96%   BMI 24.07 kg/m  Physical Exam Gen: NAD, alert, cooperative with exam, well-appearing ENT: normal lips, normal nasal mucosa,  Eye: normal EOM, normal conjunctiva and lids CV:  no edema, +2 pedal pulses   Resp: no accessory muscle use, non-labored,  Skin: no rashes, no areas of induration  Neuro: normal tone, normal sensation to touch Psych:  normal insight, alert and oriented MSK:  Right foot: Right foot has several structural changes  that have occurred area she has loss of her transverse arch and shift of the subtalar joint. There is swelling in the tarsal tunnel. She has a higher arch within the right foot compared to the left foot. Has to ambulate with a cane. Some pain with resistance to inversion. Still movement with talar tilt. Neurovascular intact.    Limited ultrasound: Right foot:  Posterior tib has findings to suggest dysfunction at the tarsal tunnel. Insertion onto the navicular appears to be normal   Summary: Posterior tibialis dysfunction  Ultrasound and interpretation by Clearance Coots, MD             ASSESSMENT & PLAN:   Foot swelling This has resolved with compression - Follow-up as needed.  Posterior tibial tendon dysfunction The structure of her foot has likely led to this problem. Unclear she has had a distant history as to why her right foot is so structural different than her left. She reports having a tear for right gluteus medius and which was surgically repaired but did not do well. - X-rays of her foot - Advised compression provided home exercises. Counseled on these. - We will refer for custom orthotics - We will provide [facility] when it is available next

## 2017-08-28 NOTE — Patient Instructions (Signed)
Thank you for coming in,   We will call the results from today.  I will also check to get you in for custom orthotics.   We will call you once the rub on medicine is in  Please follow up with me if no improvement.    Please feel free to call with any questions or concerns at any time, at [phone]. --Dr. Raeford Razor

## 2017-08-29 ENCOUNTER — Telehealth: Payer: Self-pay | Admitting: Family Medicine

## 2017-08-29 NOTE — Telephone Encounter (Signed)
Spoke with patient about her xray results.   Lauren Ax, MD [facility] 08/29/2017, 9:00 AM

## 2017-09-04 DIAGNOSIS — R490 Dysphonia: Secondary | ICD-10-CM | POA: Insufficient documentation

## 2017-09-04 DIAGNOSIS — J449 Chronic obstructive pulmonary disease, unspecified: Secondary | ICD-10-CM | POA: Diagnosis not present

## 2017-09-04 DIAGNOSIS — Z7289 Other problems related to lifestyle: Secondary | ICD-10-CM | POA: Diagnosis not present

## 2017-09-04 DIAGNOSIS — K219 Gastro-esophageal reflux disease without esophagitis: Secondary | ICD-10-CM | POA: Diagnosis not present

## 2017-09-04 DIAGNOSIS — J309 Allergic rhinitis, unspecified: Secondary | ICD-10-CM | POA: Diagnosis not present

## 2017-09-04 DIAGNOSIS — Z87891 Personal history of nicotine dependence: Secondary | ICD-10-CM | POA: Diagnosis not present

## 2017-09-11 ENCOUNTER — Encounter: Payer: Self-pay | Admitting: Family Medicine

## 2017-09-18 ENCOUNTER — Encounter: Payer: Self-pay | Admitting: Sports Medicine

## 2017-09-18 ENCOUNTER — Ambulatory Visit (INDEPENDENT_AMBULATORY_CARE_PROVIDER_SITE_OTHER): Payer: Medicare Other | Admitting: Sports Medicine

## 2017-09-18 ENCOUNTER — Ambulatory Visit: Payer: Self-pay

## 2017-09-18 VITALS — BP 116/68 | HR 50 | Ht 64.0 in | Wt 140.4 lb

## 2017-09-18 DIAGNOSIS — R269 Unspecified abnormalities of gait and mobility: Secondary | ICD-10-CM

## 2017-09-18 DIAGNOSIS — M25871 Other specified joint disorders, right ankle and foot: Secondary | ICD-10-CM

## 2017-09-18 DIAGNOSIS — M76829 Posterior tibial tendinitis, unspecified leg: Secondary | ICD-10-CM

## 2017-09-18 NOTE — Patient Instructions (Signed)

## 2017-09-18 NOTE — Progress Notes (Signed)
OFFICE VISIT NOTE [name]. [name], [facility]  [name] - 76 y.o. female MRN [id number]  Date of birth: May 28, 1941  Visit Date: 09/18/2017  PCP: Biagio Borg, MD   Referred by: Biagio Borg, MD  Thalia Bloodgood PT, LAT, ATC acting as scribe for Dr. Paulla Fore.  SUBJECTIVE:   Chief Complaint  Patient presents with  . New Patient (Initial Visit)    R post tib dysfunction   HPI: As below and per problem based documentation when appropriate.  [name] is a new pt presenting w/ c/o R lower leg/foot pain and previously diagnosed w/ post tib dysfunction by Dr. Raeford Razor at [facility].  Pt most recently saw Dr. Raeford Razor on 08/28/17.  Pt states that the swelling in her R foot has gotten worse and also states that the pain has worsened since she saw Dr. Raeford Razor.  The pain and swelling are located to her R medial ankle and medial foot.  Pt states that she has no pain at rest but rates it as a 6/10 at it's worst when she's walking and bearing weight through her R foot.  Alleviating factors include wearing a higher boot but she states that ice, heat and medication have not helped her symptoms.        Review of Systems  Constitutional: Negative for chills, fever and weight loss.  HENT: Negative.   Eyes: Negative.   Respiratory: Positive for shortness of breath. Negative for cough and wheezing.   Cardiovascular: Negative for chest pain and palpitations.  Gastrointestinal: Negative for abdominal pain, heartburn and nausea.  Genitourinary: Negative.   Musculoskeletal: Positive for joint pain. Negative for falls.  Neurological: Negative for dizziness, tingling and headaches.  Endo/Heme/Allergies: Bruises/bleeds easily.  Psychiatric/Behavioral: Positive for depression. The patient is nervous/anxious and has insomnia.     Otherwise per HPI.  HISTORY & PERTINENT PRIOR DATA:  No specialty comments available. She reports that  she quit smoking about 21 years ago. Her smoking use included Cigarettes. She started smoking about 63 years ago. She has a 126.00 pack-year smoking history. She has never used smokeless tobacco.   Recent Labs  04/26/17 0801  HGBA1C 6.3   Allergies reviewed per EMR Prior to Admission medications   Medication Sig Start Date End Date Taking? Authorizing Provider  albuterol (PROVENTIL HFA;VENTOLIN HFA) 108 (90 Base) MCG/ACT inhaler Inhale 1-2 puffs into the lungs every 6 (six) hours as needed for wheezing or shortness of breath. 05/12/17  Yes Javier Glazier, MD  albuterol (PROVENTIL) (2.5 MG/3ML) 0.083% nebulizer solution Take 3 mLs (2.5 mg total) by nebulization every 6 (six) hours as needed for wheezing or shortness of breath. 05/12/17  Yes Javier Glazier, MD  apixaban (ELIQUIS) 5 MG TABS tablet Take 1 tablet (5 mg total) by mouth 2 (two) times daily. 06/12/17  Yes Biagio Borg, MD  azelastine (ASTELIN) 0.1 % nasal spray Place 2 sprays into the nose daily. Use in each nostril as directed Patient taking differently: Place 2 sprays into both nostrils daily.  03/01/16  Yes Javier Glazier, MD  budesonide-formoterol [facility]) 160-4.5 MCG/ACT inhaler Inhale 2 puffs into the lungs 2 (two) times daily. 03/28/17  Yes Javier Glazier, MD  buPROPion (WELLBUTRIN XL) 300 MG 24 hr tablet Take 1 tablet (300 mg total) by mouth daily. 06/12/17  Yes Biagio Borg, MD  Calcium Carbonate-Vitamin D (CALCIUM + D PO) Take 1 tablet by  mouth 2 (two) times daily.    Yes [provider]  cyclobenzaprine (FLEXERIL) 5 MG tablet Take 1 tablet (5 mg total) by mouth 3 (three) times daily as needed for muscle spasms. 08/19/15  Yes Biagio Borg, MD  ezetimibe (ZETIA) 10 MG tablet Take 1 tablet (10 mg total) by mouth daily. 08/14/17 11/12/17 Yes Biagio Borg, MD  fluticasone [facility]) 50 MCG/ACT nasal spray Place 1 spray into both nostrils 2 (two) times daily. 08/08/17  Yes Javier Glazier, MD  furosemide (LASIX)  20 MG tablet Take 1 tablet (20 mg total) by mouth as needed (swelling). 08/14/17  Yes Biagio Borg, MD  gabapentin (NEURONTIN) 300 MG capsule 1 tab by mouth in AM, 1 po at midday, then 2 by mouth at bedtime for sleep and pain 04/12/17  Yes Biagio Borg, MD  guaiFENesin (MUCINEX) 600 MG 12 hr tablet Take 1,200 mg by mouth 2 (two) times daily as needed for cough or to loosen phlegm.   Yes [provider]  levothyroxine (SYNTHROID, LEVOTHROID) 25 MCG tablet Take 1 tablet (25 mcg total) by mouth daily before breakfast. 04/27/17  Yes Biagio Borg, MD  Lidocaine HCl 2 % CREA Apply 1 application topically daily as needed (for shingles flares).   Yes [provider]  LORazepam (ATIVAN) 0.5 MG tablet 1-2 tab by mouth at bedtime as needed for sleep 03/24/17  Yes Biagio Borg, MD  Melatonin 5 MG TABS Take 5 mg by mouth at bedtime.   Yes [provider]  mirabegron ER (MYRBETRIQ) 50 MG TB24 tablet Take 50 mg by mouth daily.   Yes [provider]  montelukast (SINGULAIR) 10 MG tablet Take 1 tablet (10 mg total) by mouth daily. 05/22/17  Yes Biagio Borg, MD  Multiple Vitamin (MULTIVITAMIN) tablet Take 1 tablet by mouth daily.     Yes [provider]  nystatin (MYCOSTATIN) 100000 UNIT/ML suspension Take 5 cc by mouth, swish and swallow 4 times daily. 09/29/16  Yes Esterwood, Amy S, PA-C  omeprazole (PRILOSEC) 40 MG capsule Take 1 capsule (40 mg total) by mouth daily. 06/23/17  Yes Irene Shipper, MD  Probiotic Product (PROBIOTIC DAILY PO) Take by mouth.   Yes [provider]  ranitidine (ZANTAC) 150 MG tablet Take 1 tablet (150 mg total) by mouth at bedtime. 08/23/17  Yes Javier Glazier, MD  Respiratory Therapy Supplies (FLUTTER) DEVI Use 2-3 times per day 01/06/15  Yes Elsie Stain, MD  Spacer/Aero-Holding Chambers (AEROCHAMBER Z-STAT PLUS) inhaler Use as instructed 12/22/15  Yes Javier Glazier, MD  Tiotropium Bromide Monohydrate (SPIRIVA RESPIMAT) 2.5  MCG/ACT AERS Inhale 2 puffs into the lungs daily. 03/27/17  Yes Javier Glazier, MD  metoprolol succinate (TOPROL-XL) 100 MG 24 hr tablet Take 1 tablet (100 mg total) by mouth daily. Take with or immediately following a meal. 04/03/17 07/04/17  Camnitz, Ocie Doyne, MD   Patient Active Problem List   Diagnosis Date Noted  . Posterior tibial tendon dysfunction 08/28/2017  . Change in voice 08/22/2017  . Foot swelling 06/22/2017  . Drug hypersensitivity 06/22/2017  . Lobar pneumonia, unspecified organism ([place]) 06/02/2017  . Essential hypertension 05/30/2017  . Anemia, chronic disease 05/30/2017  . PHN (postherpetic neuralgia) 04/16/2017  . Abnormal TSH 04/12/2017  . Anemia 04/12/2017  . Nausea 12/14/2016  . Loss of weight 12/14/2016  . Chronic atrial fibrillation ([place]) 09/17/2016  . Acute on chronic combined systolic (congestive) and diastolic (congestive) heart failure 08/17/2016  .  Syncope 08/10/2016  . PAF (paroxysmal atrial fibrillation) ([place]) 08/10/2016  . COPD (chronic obstructive pulmonary disease) ([place]) 08/10/2016  . Faintness   . Chronic pain syndrome   . Atrial fibrillation with rapid ventricular response ([place]) 07/27/2016  . Combined congestive systolic and diastolic heart failure ([place]) 07/27/2016  . HLD (hyperlipidemia) 07/27/2016  . Insomnia 07/27/2016  . Depression with anxiety 07/27/2016  . Persistent atrial fibrillation ([place]) 07/27/2016  . Gluteus medius tear 03/15/2016  . Hyperglycemia 11/12/2015  . Lumbar scoliosis 07/03/2015  . Diarrhea 04/17/2014  . Stricture and stenosis of esophagus 03/03/2014  . On home oxygen therapy   . Swelling, mass, or lump in chest 12/19/2013  . Right wrist pain 12/19/2013  . Depression 11/29/2013  . Lung nodule 08/01/2013  . Special screening for malignant neoplasms, colon 03/20/2013  . Lower back pain 01/21/2013  . Encounter for long-term (current) use of high-risk medication 02/13/2012  . Sleep related leg cramps 10/01/2011  .  Basal cell carcinoma   . Osteoarthritis   . History of CVA (cerebrovascular accident)   . DUB (dysfunctional uterine bleeding)   . Atrophic vaginitis   . Menopausal symptoms   . DI (detrusor instability)   . Preventative health care 08/07/2011  . Long term current use of Coumadin 01/05/2011  . Transient cerebral ischemia 03/25/2009  . FATIGUE 06/05/2008  . ALLERGIC RHINITIS 04/24/2008  . NEPHROLITHIASIS, HX OF 10/05/2007  . RECTOCELE WITHOUT MENTION OF UTERINE PROLAPSE 10/04/2007  . BLADDER SUSPENSION, HX OF 10/04/2007  . HEMORRHOIDS 08/11/2007  . Gold B Copd with acute bronchitis 08/11/2007  . GERD 08/11/2007  . IBS 08/11/2007  . FIBROCYSTIC BREAST DISEASE, HX OF 08/11/2007   Past Medical History:  Diagnosis Date  . Allergic rhinitis   . Anxiety   . Atrial septal aneurysm   . Basal cell carcinoma   . Bradycardia   . Cataract    bil cataracts removed  . Chronic combined systolic and diastolic CHF (congestive heart failure) ([place])   . CKD (chronic kidney disease), stage III ([place])   . Clostridium difficile infection   . COPD (chronic obstructive pulmonary disease) ([place])   . Depression 11/29/2013  . DI (detrusor instability)   . Esophageal stricture   . GERD (gastroesophageal reflux disease)   . Hemorrhoids   . Hiatal hernia   . History of kidney stones   . HLD (hyperlipidemia)   . HTN (hypertension)   . Ischemic colon ([place])    a. 08/2016 - adm to  [facility] with a ruptured colon s/p right hemicolectomy with ileostomy formation for ischemic and necrotic colon.   . Lung nodule    a. followed by pulmonology  . Mild CAD    a. minor nonobstructive CAD 08/22/16.  . Nephrolithiasis    hx  . NICM (nonischemic cardiomyopathy) ([place])   . Orthostatic hypotension   . Osteoarthritis   . PAF (paroxysmal atrial fibrillation) ([place])    a. diagnosed 07/2016 s/p DCCV 08/01/16 with recurrence, managed with rate control for now.   . Pneumonia 2013  . PVC's (premature ventricular  contractions)   . Shingles since Nov 18, 2013   on right arm from shoulder to wrist  . Status post dilation of esophageal narrowing   . Stroke [facility]) 2001   STROKES, TIA'S  . Syncope    a. 08/2016 felt due to orthostatic hypotension.  Marland Kitchen TIA (transient ischemic attack) last 2001   anticoagulation therapy on coumadin   Family History  Problem Relation Age of Onset  .  COPD Mother   . Breast cancer Mother        mid 28's  . Uterine cancer Mother   . Emphysema Mother   . Breast cancer Maternal Aunt        x 3 aunts, post menopausal  . Breast cancer Maternal Aunt   . Breast cancer Maternal Aunt   . Colon cancer Neg Hx   . Esophageal cancer Neg Hx   . Rectal cancer Neg Hx   . Stomach cancer Neg Hx   . Pancreatic cancer Neg Hx    Past Surgical History:  Procedure Laterality Date  . ANTERIOR AND POSTERIOR VAGINAL REPAIR  2008   A&P REPAIR-DR MCDIARMID  . ANTERIOR LATERAL LUMBAR FUSION 4 LEVELS Right 07/03/2015   Procedure: Right Lumbar one-two, Lumbar two-three, Lumbar three-four, Lumbar four-five Anterior lateral lumbar interbody fusion;  Surgeon: Erline Levine, MD;  Location: [facility]URO ORS;  Service: Neurosurgery;  Laterality: Right;  Right L1-2 L2-3 L3-4 L4-5 Anterior lateral lumbar interbody fusion  . BALLOON DILATION N/A 03/03/2014   Procedure: BALLOON DILATION;  Surgeon: Inda Castle, MD;  Location: [facility] ENDOSCOPY;  Service: Endoscopy;  Laterality: N/A;  . BLADDER SUSPENSION    . BREAST BIOPSY    . CARDIAC CATHETERIZATION N/A 08/22/2016   Procedure: Left Heart Cath and Coronary Angiography;  Surgeon: Peter M Martinique, MD;  Location: [facility] CV LAB;  Service: Cardiovascular;  Laterality: N/A;  . CARDIOVERSION N/A 08/01/2016   Procedure: CARDIOVERSION;  Surgeon: Jerline Pain, MD;  Location: [facility];  Service: Cardiovascular;  Laterality: N/A;  . CARDIOVERSION N/A 10/11/2016   Procedure: CARDIOVERSION;  Surgeon: Sueanne Margarita, MD;  Location: [facility] ENDOSCOPY;  Service:  Cardiovascular;  Laterality: N/A;  . COLONOSCOPY    . ESOPHAGOGASTRODUODENOSCOPY N/A 03/03/2014   Procedure: ESOPHAGOGASTRODUODENOSCOPY (EGD);  Surgeon: Inda Castle, MD;  Location: [facility];  Service: Endoscopy;  Laterality: N/A;  . EXCISION OF BASAL CELL CA  2011  . GLUTEUS MINIMUS REPAIR Right 03/15/2016   Procedure: RIGHT HIP GLUTEUS MEDIUS TENDON REPAIR;  Surgeon: Paralee Cancel, MD;  Location: [facility] OR[facility];  Service: Orthopedics;  Laterality: Right;  . ileostomy reversal    . LUMBAR PERCUTANEOUS PEDICLE SCREW 4 LEVEL Right 07/03/2015   Procedure: Lumbar one-Sacral one Bilateral percutaneous pedicle screws;  Surgeon: Erline Levine, MD;  Location: [place] [facility] ORS;  Service: Neurosurgery;  Laterality: Right;  L1-S1 Bilateral percutaneous pedicle screws  . RECTOCELE REPAIR  2008   A&P REPAIR -DR. MCDIARMID  . THUMB SURGERY Right 10-15 yrs ago  . UPPER GASTROINTESTINAL ENDOSCOPY    . VAGINAL HYSTERECTOMY  1983   NO BSO-DR. MABRY  . VARICOSE VEIN SURGERY     Social History   Occupational History  . retired Retired   Social History Main Topics  . Smoking status: Former Smoker    Packs/day: 3.00    Years: 42.00    Types: Cigarettes    Start date: 08/04/1954    Quit date: 12/06/1995  . Smokeless tobacco: Never Used  . Alcohol use No     Comment: occas very little  . Drug use: No  . Sexual activity: Not Currently    Birth control/ protection: Surgical    OBJECTIVE:  VS:  HT:5\' 4"  (162.6 cm)   WT:140 lb 6.4 oz (63.7 kg)  BMI:24.09    BP:116/68  HR:(!) 50bpm  TEMP: ( )  RESP:96 % EXAM: Findings:  WDWN, NAD, Non-toxic appearing Alert & appropriately interactive Not depressed or anxious appearing No increased  work of breathing. Pupils are equal. EOM intact without nystagmus No clubbing or cyanosis of the extremities appreciated No significant rashes/lesions/ulcerations overlying the examined area. DP & PT pulses 2+/4.  1+ pretibial edema Sensation intact to light touch in lower  extremities.  Right foot has marked cavus deformity with splay toe of the forefoot.  She has posterior tibialis type I insufficiency but has moderate posterior tibialis recruitment with toe off.  Ankle drawer testing is normal.  No significant pain with talar tilt.  Marked tenderness over the anterior medial and lateral joint lines.  Moderate amount of pain with posterior tibialis dysfunction and pain over the posterior tibialis tendons.  Inversion strength is 5 out of 5.    RADIOLOGY:  ASSESSMENT & PLAN:     ICD-10-CM   1. Posterior tibial tendon dysfunction M76.829 US GUIDED NEEDLE PLACEMENT(NO LINKED CHARGES)  2. Gait disturbance R26.9   3. Ankle impingement syndrome, right M25.871    ================================================================= Posterior tibial tendon dysfunction She does have posterior tibialis dysfunction likely secondary to her abnormal gait from her prior hip issues.  This is causing ankle impingement syndrome on the right ankle and this was injected today.  She needs longitudinal arch support and custom cushion orthotics with [employer] Orthotics will be fabricated for her at next office visit.  We will plan to have her follow-up in 6-8 weeks for follow-up of her injection but will see her once again in the interim for orthotics.  PROCEDURE NOTE -  ULTRASOUND GUIDEDINJECTION: Right ankle Images were obtained and interpreted by myself, Teresa Coombs, DO  Images have been saved and stored to PACS system. Images obtained on: GE S7 Ultrasound machine  ULTRASOUND FINDINGS: Marked synovitis consistent with ankle impingement  DESCRIPTION OF PROCEDURE:  The patient's clinical condition is marked by substantial pain and/or significant functional disability. Other conservative therapy has not provided relief, is contraindicated, or not appropriate. There is a reasonable likelihood that injection will significantly improve the patient's pain and/or functional impairment.  After discussing the risks, benefits and expected outcomes of the injection and all questions were reviewed and answered, the patient wished to undergo the above named procedure. Verbal consent was obtained. The ultrasound was used to identify the target structure and adjacent neurovascular structures. The skin was then prepped in sterile fashion and the target structure was injected under direct visualization using sterile technique as below: PREP: Alcohol, Ethel Chloride APPROACH: Direct inplane, anterior medial, single injection, 25g 1.5" needle INJECTATE: 1cc 0.5% marcaine, 1cc 40mg  DepoMedrol ASPIRATE: N/A DRESSING: Band-Aid  Post procedural instructions including recommending icing and warning signs for infection were reviewed. This procedure was well tolerated and there were no complications.   IMPRESSION: Succesful US Guided Injection    ================================================================= Patient Instructions  You had an injection today.  Things to be aware of after injection are listed below: . You may experience no significant improvement or even a slight worsening in your symptoms during the first 24 to 48 hours.  After that we expect your symptoms to improve gradually over the next 2 weeks for the medicine to have its maximal effect.  You should continue to have improvement out to 6 weeks after your injection. . Dr. Paulla Fore recommends icing the site of the injection for 20 minutes  1-2 times the day of your injection . You may shower but no swimming, tub bath or Jacuzzi for 24 hours. . If your bandage falls off this does not need to be replaced.  It is appropriate to  remove the bandage after 4 hours. . You may resume light activities as tolerated unless otherwise directed per Dr. Paulla Fore during your visit  POSSIBLE STEROID SIDE EFFECTS:  Side effects from injectable steroids tend to be less than when taken orally however you may experience some of the symptoms listed  below.  If experienced these should only last for a short period of time. Change in menstrual flow  Edema (swelling)  Increased appetite Skin flushing (redness)  Skin rash/acne  Thrush (oral) Yeast vaginitis    Increased sweating  Depression Increased blood glucose levels Cramping and leg/calf  Euphoria (feeling happy)  POSSIBLE PROCEDURE SIDE EFFECTS: The side effects of the injection are usually fairly minimal however if you may experience some of the following side effects that are usually self-limited and will is off on their own.  If you are concerned please feel free to call the office with questions:  Increased numbness or tingling  Nausea or vomiting  Swelling or bruising at the injection site   Please call our office if if you experience any of the following symptoms over the next week as these can be signs of infection:   Fever greater than 100.38F  Significant swelling at the injection site  Significant redness or drainage from the injection site  If after 2 weeks you are continuing to have worsening symptoms please call our office to discuss what the next appropriate actions should be including the potential for a return office visit or other diagnostic testing.     =================================================================  Follow-up: Return for Orthotic visit in 1-2 weeks.   CMA/ATC served as Education administrator during this visit. History, Physical, and Plan performed by medical provider. Documentation and orders reviewed and attested to.      Teresa Coombs, [facility] Physician

## 2017-09-18 NOTE — Procedures (Signed)
PROCEDURE NOTE -  ULTRASOUND GUIDEDINJECTION: Right ankle Images were obtained and interpreted by myself, Teresa Coombs, DO  Images have been saved and stored to PACS system. Images obtained on: GE S7 Ultrasound machine  ULTRASOUND FINDINGS: Marked synovitis consistent with ankle impingement  DESCRIPTION OF PROCEDURE:  The patient's clinical condition is marked by substantial pain and/or significant functional disability. Other conservative therapy has not provided relief, is contraindicated, or not appropriate. There is a reasonable likelihood that injection will significantly improve the patient's pain and/or functional impairment. After discussing the risks, benefits and expected outcomes of the injection and all questions were reviewed and answered, the patient wished to undergo the above named procedure. Verbal consent was obtained. The ultrasound was used to identify the target structure and adjacent neurovascular structures. The skin was then prepped in sterile fashion and the target structure was injected under direct visualization using sterile technique as below: PREP: Alcohol, Ethel Chloride APPROACH: Direct inplane, anterior medial, single injection, 25g 1.5" needle INJECTATE: 1cc 0.5% marcaine, 1cc 40mg  DepoMedrol ASPIRATE: N/A DRESSING: Band-Aid  Post procedural instructions including recommending icing and warning signs for infection were reviewed. This procedure was well tolerated and there were no complications.   IMPRESSION: Succesful US Guided Injection

## 2017-09-18 NOTE — Assessment & Plan Note (Signed)
She does have posterior tibialis dysfunction likely secondary to her abnormal gait from her prior hip issues.  This is causing ankle impingement syndrome on the right ankle and this was injected today.  She needs longitudinal arch support and custom cushion orthotics with [employer] Orthotics will be fabricated for her at next office visit.  We will plan to have her follow-up in 6-8 weeks for follow-up of her injection but will see her once again in the interim for orthotics.

## 2017-09-23 ENCOUNTER — Encounter: Payer: Self-pay | Admitting: Pulmonary Disease

## 2017-09-25 MED ORDER — FLUTICASONE PROPIONATE 50 MCG/ACT NA SUSP: 1.0000 | Freq: Two times a day (BID) | NASAL | 1 refills | Status: DC

## 2017-09-25 NOTE — Telephone Encounter (Signed)
Patient requested a refill on her Flonase to be sent to her mail order pharmacy. RX has been sent in. Nothing further needed at time of email.

## 2017-10-09 ENCOUNTER — Ambulatory Visit (INDEPENDENT_AMBULATORY_CARE_PROVIDER_SITE_OTHER): Payer: Medicare Other | Admitting: Sports Medicine

## 2017-10-09 ENCOUNTER — Encounter: Payer: Self-pay | Admitting: Sports Medicine

## 2017-10-09 VITALS — BP 130/82 | HR 80 | Ht 64.0 in | Wt 142.0 lb

## 2017-10-09 DIAGNOSIS — M25871 Other specified joint disorders, right ankle and foot: Secondary | ICD-10-CM

## 2017-10-09 DIAGNOSIS — M76829 Posterior tibial tendinitis, unspecified leg: Secondary | ICD-10-CM

## 2017-10-09 DIAGNOSIS — R269 Unspecified abnormalities of gait and mobility: Secondary | ICD-10-CM | POA: Diagnosis not present

## 2017-10-09 NOTE — Progress Notes (Signed)
[name]. [place], [place] at [place]  [name] - 76 y.o. female Mar 12, 1941 [id number] Visit Date: 10/09/2017 PCP: Biagio Borg, MD Referred by: Biagio Borg, MD   SUBJECTIVE:   Chief Complaint  Patient presents with  . Follow-up    Posterior tibial tendon dysfunction   Pt presents for custom orthotic fabrication. Procedure only visit     ROS Otherwise per HPI.  HISTORY & PERTINENT PRIOR DATA:  No specialty comments available. She reports that she quit smoking about 21 years ago. Her smoking use included cigarettes. She started smoking about 63 years ago. She has a 126.00 pack-year smoking history. she has never used smokeless tobacco.  Recent Labs    04/26/17 0801  HGBA1C 6.3    Patient Active Problem List   Diagnosis Date Noted  . Posterior tibial tendon dysfunction 08/28/2017  . Change in voice 08/22/2017  . Foot swelling 06/22/2017  . Drug hypersensitivity 06/22/2017  . Lobar pneumonia, unspecified organism ([place]) 06/02/2017  . Essential hypertension 05/30/2017  . Anemia, chronic disease 05/30/2017  . PHN (postherpetic neuralgia) 04/16/2017  . Abnormal TSH 04/12/2017  . Anemia 04/12/2017  . Nausea 12/14/2016  . Loss of weight 12/14/2016  . Chronic atrial fibrillation ([place]) 09/17/2016  . Acute on chronic combined systolic (congestive) and diastolic (congestive) heart failure 08/17/2016  . Syncope 08/10/2016  . PAF (paroxysmal atrial fibrillation) ([place]) 08/10/2016  . COPD (chronic obstructive pulmonary disease) ([place]) 08/10/2016  . Faintness   . Chronic pain syndrome   . Atrial fibrillation with rapid ventricular response ([place]) 07/27/2016  . Combined congestive systolic and diastolic heart failure ([place]) 07/27/2016  . HLD (hyperlipidemia) 07/27/2016  . Insomnia 07/27/2016  . Depression with anxiety 07/27/2016  . Persistent atrial fibrillation ([place]) 07/27/2016  . Gluteus medius tear 03/15/2016    . Hyperglycemia 11/12/2015  . Lumbar scoliosis 07/03/2015  . Diarrhea 04/17/2014  . Stricture and stenosis of esophagus 03/03/2014  . On home oxygen therapy   . Swelling, mass, or lump in chest 12/19/2013  . Right wrist pain 12/19/2013  . Depression 11/29/2013  . Lung nodule 08/01/2013  . Special screening for malignant neoplasms, colon 03/20/2013  . Lower back pain 01/21/2013  . Encounter for long-term (current) use of high-risk medication 02/13/2012  . Sleep related leg cramps 10/01/2011  . Basal cell carcinoma   . Osteoarthritis   . History of CVA (cerebrovascular accident)   . DUB (dysfunctional uterine bleeding)   . Atrophic vaginitis   . Menopausal symptoms   . DI (detrusor instability)   . Preventative health care 08/07/2011  . Long term current use of Coumadin 01/05/2011  . Transient cerebral ischemia 03/25/2009  . FATIGUE 06/05/2008  . ALLERGIC RHINITIS 04/24/2008  . NEPHROLITHIASIS, HX OF 10/05/2007  . RECTOCELE WITHOUT MENTION OF UTERINE PROLAPSE 10/04/2007  . BLADDER SUSPENSION, HX OF 10/04/2007  . HEMORRHOIDS 08/11/2007  . Gold B Copd with acute bronchitis 08/11/2007  . GERD 08/11/2007  . IBS 08/11/2007  . FIBROCYSTIC BREAST DISEASE, HX OF 08/11/2007   Past Medical History:  Diagnosis Date  . Allergic rhinitis   . Anxiety   . Atrial septal aneurysm   . Basal cell carcinoma   . Bradycardia   . Cataract    bil cataracts removed  . Chronic combined systolic and diastolic CHF (congestive heart failure) ([place])   . CKD (chronic kidney disease), stage III ([place])   . Clostridium difficile infection   . COPD (  chronic obstructive pulmonary disease) ([place])   . Depression 11/29/2013  . DI (detrusor instability)   . Esophageal stricture   . GERD (gastroesophageal reflux disease)   . Hemorrhoids   . Hiatal hernia   . History of kidney stones   . HLD (hyperlipidemia)   . HTN (hypertension)   . Ischemic colon ([place])    a. 08/2016 - adm to  [facility] with a  ruptured colon s/p right hemicolectomy with ileostomy formation for ischemic and necrotic colon.   . Lung nodule    a. followed by pulmonology  . Mild CAD    a. minor nonobstructive CAD 08/22/16.  . Nephrolithiasis    hx  . NICM (nonischemic cardiomyopathy) ([place])   . Orthostatic hypotension   . Osteoarthritis   . PAF (paroxysmal atrial fibrillation) ([place])    a. diagnosed 07/2016 s/p DCCV 08/01/16 with recurrence, managed with rate control for now.   . Pneumonia 2013  . PVC's (premature ventricular contractions)   . Shingles since Nov 18, 2013   on right arm from shoulder to wrist  . Status post dilation of esophageal narrowing   . Stroke [facility]) 2001   STROKES, TIA'S  . Syncope    a. 08/2016 felt due to orthostatic hypotension.  Marland Kitchen TIA (transient ischemic attack) last 2001   anticoagulation therapy on coumadin   Family History  Problem Relation Age of Onset  . COPD Mother   . Breast cancer Mother        mid 59's  . Uterine cancer Mother   . Emphysema Mother   . Breast cancer Maternal Aunt        x 3 aunts, post menopausal  . Breast cancer Maternal Aunt   . Breast cancer Maternal Aunt   . Colon cancer Neg Hx   . Esophageal cancer Neg Hx   . Rectal cancer Neg Hx   . Stomach cancer Neg Hx   . Pancreatic cancer Neg Hx    Past Surgical History:  Procedure Laterality Date  . ANTERIOR AND POSTERIOR VAGINAL REPAIR  2008   A&P REPAIR-DR MCDIARMID  . BLADDER SUSPENSION    . BREAST BIOPSY    . COLONOSCOPY    . EXCISION OF BASAL CELL CA  2011  . ileostomy reversal    . RECTOCELE REPAIR  2008   A&P REPAIR -DR. MCDIARMID  . THUMB SURGERY Right 10-15 yrs ago  . UPPER GASTROINTESTINAL ENDOSCOPY    . VAGINAL HYSTERECTOMY  1983   NO BSO-DR. MABRY  . VARICOSE VEIN SURGERY     Social History   Occupational History  . Occupation: retired    Fish farm manager: RETIRED  Tobacco Use  . Smoking status: Former Smoker    Packs/day: 3.00    Years: 42.00    Pack years: 126.00    Types:  Cigarettes    Start date: 08/04/1954    Last attempt to quit: 12/06/1995    Years since quitting: 21.8  . Smokeless tobacco: Never Used  Substance and Sexual Activity  . Alcohol use: No    Alcohol/week: 0.0 oz    Comment: occas very little  . Drug use: No  . Sexual activity: Not Currently    Birth control/protection: Surgical     OBJECTIVE:  VS:  HT:5\' 4"  (162.6 cm)   WT:142 lb (64.4 kg)  BMI:24.36    BP:130/82  HR:80bpm  TEMP: ( )  RESP:95 % Physical Exam: GENERAL:  WDWN, NAD, Non-toxic appearing PSYCH:  Alert & appropriately interactive  Not depressed or anxious appearing  Ortho Exam  High cavus rigid foot with splay toe of the medial and lateral column.  She has a hindfoot valgus deformity.  She walks with an antalgic gait that improves with custom cushion orthotics.  PROCEDURE: CUSTOM ORTHOTIC FABRICATION Patient's underlying musculoskeletal conditions are directly related to poor biomechanics and will benefit from a functional custom orthotic.  There are no significant foot deformities that complicate the use of a custom orthotic.  The patient was fitted for a standard, cushioned, semi-rigid orthotic. The orthotic was heated & placed on the orthotic stand. The patient was positioned in subtalar neutral position and 10 of ankle dorsiflexion and weight bearing stance on the heated orthotic blank. After completion of the molding a base was applied to the orthotic blank. The orthotic was ground to a stable position for weightbearing. The patient ambulated in these and reported they were comfortable without pressure spots.              BLANK:  Size 8 - Standard Cushioned                 BASE:  Blue EVA      POSTINGS:  n/a    ASSESSMENT & PLAN:  Visit Diagnoses:  1. Gait disturbance   2. Ankle impingement syndrome, right   3. Posterior tibial tendon dysfunction    Meds & Orders: No orders of the defined types were placed in this encounter.  No orders of the defined types  were placed in this encounter.   Follow-up: Return in about 3 weeks (around 10/30/2017).

## 2017-10-09 NOTE — Patient Instructions (Signed)
Try taking Tumeric 1000mg  daily to help with your arthritis pain.  Okay to split the dosing and take 509mg  in the morning and evening

## 2017-10-12 ENCOUNTER — Encounter: Payer: Self-pay | Admitting: Sports Medicine

## 2017-10-13 ENCOUNTER — Ambulatory Visit (INDEPENDENT_AMBULATORY_CARE_PROVIDER_SITE_OTHER): Payer: Medicare Other | Admitting: Internal Medicine

## 2017-10-13 ENCOUNTER — Other Ambulatory Visit (INDEPENDENT_AMBULATORY_CARE_PROVIDER_SITE_OTHER): Payer: Medicare Other

## 2017-10-13 ENCOUNTER — Encounter: Payer: Self-pay | Admitting: Internal Medicine

## 2017-10-13 VITALS — BP 112/70 | HR 78 | Temp 97.8°F | Ht 64.0 in | Wt 141.0 lb

## 2017-10-13 DIAGNOSIS — N183 Chronic kidney disease, stage 3 unspecified: Secondary | ICD-10-CM

## 2017-10-13 DIAGNOSIS — F32A Depression, unspecified: Secondary | ICD-10-CM

## 2017-10-13 DIAGNOSIS — E7800 Pure hypercholesterolemia, unspecified: Secondary | ICD-10-CM

## 2017-10-13 DIAGNOSIS — F329 Major depressive disorder, single episode, unspecified: Secondary | ICD-10-CM | POA: Diagnosis not present

## 2017-10-13 DIAGNOSIS — R739 Hyperglycemia, unspecified: Secondary | ICD-10-CM

## 2017-10-13 LAB — BASIC METABOLIC PANEL
BUN: 41 mg/dL — ABNORMAL HIGH (ref 6–23)
CO2: 29 mEq/L (ref 19–32)
Calcium: 9.9 mg/dL (ref 8.4–10.5)
Chloride: 103 mEq/L (ref 96–112)
Creatinine, Ser: 1.23 mg/dL — ABNORMAL HIGH (ref 0.40–1.20)
GFR: 44.86 mL/min — ABNORMAL LOW (ref >60.00)
Glucose, Bld: 87 mg/dL (ref 70–99)
Potassium: 3.8 mEq/L (ref 3.5–5.1)
Sodium: 141 mEq/L (ref 135–145)

## 2017-10-13 LAB — CBC WITH DIFFERENTIAL/PLATELET
Basophils Absolute: 0 10*3/uL (ref 0.0–0.1)
Basophils Relative: 0.7 % (ref 0.0–3.0)
Eosinophils Absolute: 0.1 10*3/uL (ref 0.0–0.7)
Eosinophils Relative: 2.2 % (ref 0.0–5.0)
HCT: 38.5 % (ref 36.0–46.0)
Hemoglobin: 12.2 g/dL (ref 12.0–15.0)
Lymphocytes Relative: 30.8 % (ref 12.0–46.0)
Lymphs Abs: 1.7 10*3/uL (ref 0.7–4.0)
MCHC: 31.6 g/dL (ref 30.0–36.0)
MCV: 71.1 fl — ABNORMAL LOW (ref 78.0–100.0)
Monocytes Absolute: 0.8 10*3/uL (ref 0.1–1.0)
Monocytes Relative: 14 % — ABNORMAL HIGH (ref 3.0–12.0)
Neutro Abs: 2.9 10*3/uL (ref 1.4–7.7)
Neutrophils Relative %: 52.3 % (ref 43.0–77.0)
Platelets: 201 10*3/uL (ref 150.0–400.0)
RBC: 5.41 Mil/uL — ABNORMAL HIGH (ref 3.87–5.11)
RDW: 18.4 % — ABNORMAL HIGH (ref 11.5–15.5)
WBC: 5.6 10*3/uL (ref 4.0–10.5)

## 2017-10-13 LAB — HEMOGLOBIN A1C: Hgb A1c MFr Bld: 5.8 % (ref 4.6–6.5)

## 2017-10-13 LAB — TSH: TSH: 1.72 u[IU]/mL (ref 0.35–4.50)

## 2017-10-13 LAB — LDL CHOLESTEROL, DIRECT: Direct LDL: 133 mg/dL

## 2017-10-13 LAB — HEPATIC FUNCTION PANEL
ALT: 15 U/L (ref 0–35)
AST: 20 U/L (ref 0–37)
Albumin: 4.2 g/dL (ref 3.5–5.2)
Alkaline Phosphatase: 89 U/L (ref 39–117)
Bilirubin, Direct: 0.1 mg/dL (ref 0.0–0.3)
Total Bilirubin: 0.4 mg/dL (ref 0.2–1.2)
Total Protein: 6.9 g/dL (ref 6.0–8.3)

## 2017-10-13 LAB — LIPID PANEL
Cholesterol: 225 mg/dL — ABNORMAL HIGH (ref 0–200)
HDL: 59.9 mg/dL (ref >39.00)
NonHDL: 165.14
Total CHOL/HDL Ratio: 4
Triglycerides: 283 mg/dL — ABNORMAL HIGH (ref 0.0–149.0)
VLDL: 56.6 mg/dL — ABNORMAL HIGH (ref 0.0–40.0)

## 2017-10-13 MED ORDER — EZETIMIBE 10 MG PO TABS: 10.0000 mg | ORAL_TABLET | Freq: Every day | ORAL | 3 refills | Status: DC

## 2017-10-13 MED ORDER — BENZONATATE 100 MG PO CAPS: 100.0000 mg | ORAL_CAPSULE | Freq: Three times a day (TID) | ORAL | 0 refills | Status: AC | PRN

## 2017-10-13 MED ORDER — CICLOPIROX 8 % EX SOLN: Freq: Every day | CUTANEOUS | 5 refills | Status: DC

## 2017-10-13 NOTE — Patient Instructions (Signed)
Please take all new medication as prescribed - the generic Penlac  Please continue all other medications as before, and refills have been done if requested.  Please have the pharmacy call with any other refills you may need.  Please continue your efforts at being more active, low cholesterol diet, and weight control.  You are otherwise up to date with prevention measures today.  Please keep your appointments with your specialists as you may have planned  You will be contacted regarding the referral for: Kidney doctor  Please go to the LAB in the Basement (turn left off the elevator) for the tests to be done today  You will be contacted by phone if any changes need to be made immediately.  Otherwise, you will receive a letter about your results with an explanation, but please check with MyChart first.  Please remember to sign up for MyChart if you have not done so, as this will be important to you in the future with finding out test results, communicating by private email, and scheduling acute appointments online when needed.  If you have Medicare related insurance (such as traditional Medicare, Blue [employer] or [employer], or similar), Please make an appointment at the [facility] with Sharee Pimple, the [employer], for your Wellness Visit in this office, which is a benefit with your insurance.  Please return in 6 months, or sooner if needed

## 2017-10-13 NOTE — Progress Notes (Addendum)
Subjective:    Patient ID: [name], female    DOB: [birth year], 76 y.o.   MRN: [id number]  HPI Here to f/u; overall doing ok,  Pt denies chest pain, increasing sob or doe, wheezing, orthopnea, PND, increased LE swelling, palpitations, dizziness or syncope.  Pt denies new neurological symptoms such as new headache, or facial or extremity weakness or numbness.  Pt denies polydipsia, polyuria, or low sugar episode.  Pt states overall good compliance with meds, mostly trying to follow appropriate diet, with wt overall stable,  but little exercise however. Sees urology for OAB.  Asks for penlac refill for nail fungus, also tessalon prn for chronic cough prior given per pulm.  Denies worsening depressive symptoms, suicidal ideation, or panic; has ongoing anxiety, not increased recently.  No new complaints or interval hx today Past Medical History:  Diagnosis Date  . Allergic rhinitis   . Anxiety   . Atrial septal aneurysm   . Basal cell carcinoma   . Bradycardia   . Cataract    bil cataracts removed  . Chronic combined systolic and diastolic CHF (congestive heart failure) ([place])   . CKD (chronic kidney disease), stage III ([place])   . Clostridium difficile infection   . COPD (chronic obstructive pulmonary disease) ([place])   . Depression 11/29/2013  . DI (detrusor instability)   . Esophageal stricture   . GERD (gastroesophageal reflux disease)   . Hemorrhoids   . Hiatal hernia   . History of kidney stones   . HLD (hyperlipidemia)   . HTN (hypertension)   . Ischemic colon ([place])    a. 08/2016 - adm to  [facility] with a ruptured colon s/p right hemicolectomy with ileostomy formation for ischemic and necrotic colon.   . Lung nodule    a. followed by pulmonology  . Mild CAD    a. minor nonobstructive CAD 08/22/16.  . Nephrolithiasis    hx  . NICM (nonischemic cardiomyopathy) ([place])   . Orthostatic hypotension   . Osteoarthritis   . PAF (paroxysmal atrial fibrillation) ([place])    a.  diagnosed 07/2016 s/p DCCV 08/01/16 with recurrence, managed with rate control for now.   . Pneumonia 2013  . PVC's (premature ventricular contractions)   . Shingles since Nov 18, 2013   on right arm from shoulder to wrist  . Status post dilation of esophageal narrowing   . Stroke [facility]) 2001   STROKES, TIA'S  . Syncope    a. 08/2016 felt due to orthostatic hypotension.  Marland Kitchen TIA (transient ischemic attack) last 2001   anticoagulation therapy on coumadin   Past Surgical History:  Procedure Laterality Date  . ANTERIOR AND POSTERIOR VAGINAL REPAIR  2008   A&P REPAIR-DR MCDIARMID  . BLADDER SUSPENSION    . BREAST BIOPSY    . COLONOSCOPY    . EXCISION OF BASAL CELL CA  2011  . ileostomy reversal    . RECTOCELE REPAIR  2008   A&P REPAIR -DR. MCDIARMID  . THUMB SURGERY Right 10-15 yrs ago  . UPPER GASTROINTESTINAL ENDOSCOPY    . VAGINAL HYSTERECTOMY  1983   NO BSO-DR. MABRY  . VARICOSE VEIN SURGERY      reports that she quit smoking about 21 years ago. Her smoking use included cigarettes. She started smoking about 63 years ago. She has a 126.00 pack-year smoking history. she has never used smokeless tobacco. She reports that she does not drink alcohol or use drugs. family history includes Breast cancer in her  maternal aunt, maternal aunt, maternal aunt, and mother; COPD in her mother; Emphysema in her mother; Uterine cancer in her mother. Allergies  Allergen Reactions  . Clarithromycin Other (See Comments)    REACTION: possible rash but could have been legionarres dz with rash  . Lipitor [Atorvastatin] Other (See Comments)    MUSCLE SPASMS  . Simvastatin Other (See Comments)    Muscle and joint pain  . Cardizem [Diltiazem Hcl] Hives  . Contrast Media [Iodinated Diagnostic Agents]     FYI - during admission at [facility] 08/2016 patient developed hives, question related to cardizem or contrast dye - not clear  . Crestor [Rosuvastatin Calcium]     fagitue and joint pain, "NO  STATINS"   Current Outpatient Medications on File Prior to Visit  Medication Sig Dispense Refill  . albuterol (PROVENTIL HFA;VENTOLIN HFA) 108 (90 Base) MCG/ACT inhaler Inhale 1-2 puffs into the lungs every 6 (six) hours as needed for wheezing or shortness of breath. 3 Inhaler 4  . albuterol (PROVENTIL) (2.5 MG/3ML) 0.083% nebulizer solution Take 3 mLs (2.5 mg total) by nebulization every 6 (six) hours as needed for wheezing or shortness of breath. 1080 mL 3  . apixaban (ELIQUIS) 5 MG TABS tablet Take 1 tablet (5 mg total) by mouth 2 (two) times daily. 180 tablet 2  . azelastine (ASTELIN) 0.1 % nasal spray Place 2 sprays into the nose daily. Use in each nostril as directed (Patient taking differently: Place 2 sprays into both nostrils daily. ) 90 mL 1  . budesonide-formoterol (SYMBICORT) 160-4.5 MCG/ACT inhaler Inhale 2 puffs into the lungs 2 (two) times daily. 3 Inhaler 4  . buPROPion (WELLBUTRIN XL) 300 MG 24 hr tablet Take 1 tablet (300 mg total) by mouth daily. 30 tablet 0  . Calcium Carbonate-Vitamin D (CALCIUM + D PO) Take 1 tablet by mouth 2 (two) times daily.     . cyclobenzaprine (FLEXERIL) 5 MG tablet Take 1 tablet (5 mg total) by mouth 3 (three) times daily as needed for muscle spasms. 270 tablet 0  . fluticasone (FLONASE) 50 MCG/ACT nasal spray Place 1 spray into both nostrils 2 (two) times daily. 48 g 1  . furosemide (LASIX) 20 MG tablet Take 1 tablet (20 mg total) by mouth as needed (swelling). 90 tablet 0  . gabapentin (NEURONTIN) 300 MG capsule 1 tab by mouth in AM, 1 po at midday, then 2 by mouth at bedtime for sleep and pain 120 capsule 5  . guaiFENesin (MUCINEX) 600 MG 12 hr tablet Take 1,200 mg by mouth 2 (two) times daily as needed for cough or to loosen phlegm.    Marland Kitchen levothyroxine (SYNTHROID, LEVOTHROID) 25 MCG tablet Take 1 tablet (25 mcg total) by mouth daily before breakfast. 30 tablet 0  . Lidocaine HCl 2 % CREA Apply 1 application topically daily as needed (for shingles  flares).    . LORazepam (ATIVAN) 0.5 MG tablet 1-2 tab by mouth at bedtime as needed for sleep 60 tablet 5  . Melatonin 5 MG TABS Take 5 mg by mouth at bedtime.    . mirabegron ER (MYRBETRIQ) 50 MG TB24 tablet Take 50 mg by mouth daily.    . montelukast (SINGULAIR) 10 MG tablet Take 1 tablet (10 mg total) by mouth daily. 90 tablet 1  . Multiple Vitamin (MULTIVITAMIN) tablet Take 1 tablet by mouth daily.      Marland Kitchen nystatin (MYCOSTATIN) 100000 UNIT/ML suspension Take 5 cc by mouth, swish and swallow 4 times daily. 280 mL  0  . omeprazole (PRILOSEC) 40 MG capsule Take 1 capsule (40 mg total) by mouth daily. 90 capsule 3  . Probiotic Product (PROBIOTIC DAILY PO) Take by mouth.    . ranitidine (ZANTAC) 150 MG tablet Take 1 tablet (150 mg total) by mouth at bedtime. 30 tablet 3  . Respiratory Therapy Supplies (FLUTTER) DEVI Use 2-3 times per day 1 each 0  . Spacer/Aero-Holding Chambers (AEROCHAMBER Z-STAT PLUS) inhaler Use as instructed 1 each 0  . Tiotropium Bromide Monohydrate (SPIRIVA RESPIMAT) 2.5 MCG/ACT AERS Inhale 2 puffs into the lungs daily. 3 Inhaler 3  . metoprolol succinate (TOPROL-XL) 100 MG 24 hr tablet Take 1 tablet (100 mg total) by mouth daily. Take with or immediately following a meal. 90 tablet 3  . [DISCONTINUED] valsartan (DIOVAN) 160 MG tablet Take 1 tablet (160 mg total) by mouth daily. 90 tablet 3   No current facility-administered medications on file prior to visit.    Review of Systems  Constitutional: Negative for other unusual diaphoresis or sweats HENT: Negative for ear discharge or swelling Eyes: Negative for other worsening visual disturbances Respiratory: Negative for stridor or other swelling  Gastrointestinal: Negative for worsening distension or other blood Genitourinary: Negative for retention or other urinary change Musculoskeletal: Negative for other MSK pain or swelling Skin: Negative for color change or other new lesions Neurological: Negative for worsening  tremors and other numbness  Psychiatric/Behavioral: Negative for worsening agitation or other fatigue All other system neg per pt    Objective:   Physical Exam BP 112/70   Pulse 78   Temp 97.8 F (36.6 C) (Oral)   Ht 5\' 4"  (1.626 m)   Wt 141 lb (64 kg)   SpO2 98%   BMI 24.20 kg/m   VS noted,  Constitutional: Pt appears in NAD HENT: Head: NCAT.  Right Ear: External ear normal.  Left Ear: External ear normal.  Eyes: . Pupils are equal, round, and reactive to light. Conjunctivae and EOM are normal Nose: without d/c or deformity Neck: Neck supple. Gross normal ROM Cardiovascular: Normal rate and regular rhythm.   Pulmonary/Chest: Effort normal and breath sounds without rales or wheezing.  Abd:  Soft, NT, ND, + BS, no organomegaly Neurological: Pt is alert. At baseline orientation, motor grossly intact Skin: Skin is warm. No rashes, other new lesions, bilat trace LE edema Psychiatric: Pt behavior is normal without agitation , mild nervous, not depressed affect No other exam findings    Assessment & Plan:

## 2017-10-15 ENCOUNTER — Encounter: Payer: Self-pay | Admitting: Internal Medicine

## 2017-10-15 NOTE — Assessment & Plan Note (Signed)
stable overall by history and exam, recent data reviewed with pt, and pt to continue medical treatment as before,  to f/u any worsening symptoms or concerns,  Lab Results  Component Value Date   CREATININE 1.23 (H) 10/13/2017  for nephrology referral

## 2017-10-15 NOTE — Assessment & Plan Note (Signed)
stable overall by history and exam, recent data reviewed with pt, and pt to continue medical treatment as before,  to f/u any worsening symptoms or concerns  

## 2017-10-15 NOTE — Assessment & Plan Note (Signed)
stable overall by history and exam, recent data reviewed with pt, and pt to continue medical treatment as before,  to f/u any worsening symptoms or concerns Lab Results  Component Value Date   HGBA1C 5.8 10/13/2017

## 2017-10-15 NOTE — Assessment & Plan Note (Signed)
Statin intolerant, to cont lower chol diet Lab Results  Component Value Date   LDLCALC 165 (H) 11/12/2015

## 2017-10-16 ENCOUNTER — Encounter: Payer: Self-pay | Admitting: Sports Medicine

## 2017-10-21 ENCOUNTER — Encounter: Payer: Self-pay | Admitting: Internal Medicine

## 2017-10-23 MED ORDER — MONTELUKAST SODIUM 10 MG PO TABS: 10.0000 mg | ORAL_TABLET | Freq: Every day | ORAL | 1 refills | Status: DC

## 2017-10-30 ENCOUNTER — Encounter: Payer: Self-pay | Admitting: Sports Medicine

## 2017-10-30 ENCOUNTER — Ambulatory Visit: Payer: Medicare Other | Admitting: Sports Medicine

## 2017-10-30 ENCOUNTER — Ambulatory Visit (INDEPENDENT_AMBULATORY_CARE_PROVIDER_SITE_OTHER): Payer: Medicare Other | Admitting: Sports Medicine

## 2017-10-30 VITALS — BP 134/68 | HR 73 | Ht 64.0 in | Wt 144.4 lb

## 2017-10-30 DIAGNOSIS — M25871 Other specified joint disorders, right ankle and foot: Secondary | ICD-10-CM | POA: Diagnosis not present

## 2017-10-30 DIAGNOSIS — R269 Unspecified abnormalities of gait and mobility: Secondary | ICD-10-CM | POA: Diagnosis not present

## 2017-10-30 DIAGNOSIS — G8929 Other chronic pain: Secondary | ICD-10-CM | POA: Diagnosis not present

## 2017-10-30 DIAGNOSIS — M76829 Posterior tibial tendinitis, unspecified leg: Secondary | ICD-10-CM

## 2017-10-30 DIAGNOSIS — M25571 Pain in right ankle and joints of right foot: Secondary | ICD-10-CM | POA: Diagnosis not present

## 2017-10-30 MED ORDER — DICLOFENAC SODIUM 2 % TD SOLN: 1.0000 "application " | Freq: Two times a day (BID) | TRANSDERMAL | 0 refills | Status: AC

## 2017-10-30 NOTE — Progress Notes (Signed)
[name]. [name], [place] at [place]  [name] - 76 y.o. female MRN [id number]  Date of birth: [birth year]   Scribe for today's visit: [name], ATC    SUBJECTIVE:  [name] is here for evaluation of Follow-up (R ankle pain and impingement) .   Compared to the last office visit on 10/09/17, her previously described symptoms are worsening in terms of her R medial ankle.  She states that she can barely walk w/o her R ankle brace on.  She notes that she wears it continuously other than when she's sleeping. Current symptoms are mild & are nonradiating She has been using her custom-made orthotics and her ankle brace.  She notes that she thought she was supposed to get some Pennsaid at one of her earlier visits but did not receive any.   ROS Denies night time disturbances. Denies fevers, chills, or night sweats. Denies unexplained weight loss. Denies personal history of cancer. Denies changes in bowel or bladder habits. Denies recent unreported falls. Reports new or worsening dyspnea or wheezing. Denies headaches or dizziness.  Denies numbness, tingling or weakness  In the extremities.  Denies dizziness or presyncopal episodes Reports lower extremity edema    HISTORY & PERTINENT PRIOR DATA:  Prior History reviewed and updated per electronic medical record. Significant history, findings, studies and interim changes include: No additional findings.  reports that she quit smoking about 21 years ago. Her smoking use included cigarettes. She started smoking about 63 years ago. She has a 126.00 pack-year smoking history. she has never used smokeless tobacco. Recent Labs    04/26/17 0801 10/13/17 1431  HGBA1C 6.3 5.8   No problems updated.   OBJECTIVE:  VS:  HT:5\' 4"  (162.6 cm)   WT:144 lb 6.4 oz (65.5 kg)  BMI:24.77    BP:134/68  HR:73bpm  TEMP: ( )  RESP:94 %  PHYSICAL EXAM: Constitutional: WDWN,  Non-toxic appearing. Psychiatric: Alert & appropriately interactive.Not depressed or anxious appearing. Respiratory: No increased work of breathing. Trachea Midline Eyes: Pupils are equal. EOM intact without nystagmus. No scleral icterus   Bilateral lower Extremities:NEUROVASCULAR FINDINGS: Bilateral Pedal Pulses: symmetrically palpable  Blank multiple: Brawny venous stasis changes that are mild in nature.  She has 2+ pitting edema of the bilateral lower extremities worse on the right than the left Blank multiple: No clubbing or cyanosis appreciated Capillary Refill is normal, less than 2 seconds Sensation: Intact to light touch in all examined dermatomes  Bilateral feet and ankles examined: She has overall quite prominent bony landmarks with focal TTP over the midportion of the medial malleolus radiating up the proximal tibia by approximately 5-6 cm.  The pain is focally over the bone and not over the posterior tibialis tendons or anterior syndesmosis.  Ankle drawer testing is normal.  Inversion and eversion strength is 5+/5 without significant pain.  She has mild pain with midfoot rotation but this is minimal.  Subtalar motion is pain-free.  Mild pain with calcaneal squeeze but this is minimal.  Slight pain with syndesmosis compression.  ASSESSMENT & PLAN:   1. Gait disturbance   2. Ankle impingement syndrome, right   3. Posterior tibial tendon dysfunction   4. Chronic pain of right ankle    Plan: Patient has had persistent if not worsening symptoms of the right medial ankle.  Prior x-rays were essentially normal although there may be a slight cortical irregularity of the medial malleolus.   Corticosteroid  injection, custom cushion orthotics and Body Helix compression sleeve have been ineffective in relieving her pain.  She reports pain that is so severe that when she awakens at night that she has a hard time ambulating to the bathroom.   We will plan to have her obtain an MRI to evaluate  for potential bony involvement versus more severe soft tissue involvement.   Trial of Pennsaid, if she does like this we can send in a prescription for Voltaren gel  No problem-specific Assessment & Plan notes found for this encounter.   ++++++++++++++++++++++++++++++++++++++++++++ Orders:  Orders Placed This Encounter  Procedures  . MR ANKLE RIGHT WO CONTRAST    Meds:  Meds ordered this encounter  Medications  . Diclofenac Sodium (PENNSAID) 2 % SOLN    Sig: Place 1 application onto the skin 2 (two) times daily for 1 day.    Dispense:  8 g    Refill:  0    ++++++++++++++++++++++++++++++++++++++++++++ Follow-up: Return for MRI review in person.   Pertinent documentation may be included in additional procedure notes, imaging studies, problem based documentation and patient instructions. Please see these sections of the encounter for additional information regarding this visit. CMA/ATC served as Education administrator during this visit. History, Physical, and Plan performed by medical provider. Documentation and orders reviewed and attested to.      [name] Sports Medicine Physician    10/30/2017 6:57 PM

## 2017-10-30 NOTE — Patient Instructions (Signed)

## 2017-11-09 ENCOUNTER — Ambulatory Visit
Admission: RE | Admit: 2017-11-09 | Discharge: 2017-11-09 | Disposition: A | Discharge status: Home / Self Care | Payer: Medicare Other | Source: Ambulatory Visit | Attending: Sports Medicine | Admitting: Sports Medicine

## 2017-11-09 DIAGNOSIS — M25871 Other specified joint disorders, right ankle and foot: Secondary | ICD-10-CM

## 2017-11-09 DIAGNOSIS — G8929 Other chronic pain: Secondary | ICD-10-CM

## 2017-11-09 DIAGNOSIS — M76821 Posterior tibial tendinitis, right leg: Secondary | ICD-10-CM | POA: Diagnosis not present

## 2017-11-09 DIAGNOSIS — M25571 Pain in right ankle and joints of right foot: Secondary | ICD-10-CM

## 2017-11-10 NOTE — Progress Notes (Signed)
Findings are consistent with bony edema changes in the cuboid and the medial malleolus.  We will need to minimize her weightbearing activity and put her into a fracture boot.  She has follow-up next week and we can discuss these options at that time.  My Chart Message: Your MRI shows that there are changes in the bones of your foot and ankle that are swollen and irritated.  This is essentially a stress fracture.  I would like for you to try to minimize how much you are on your feet and if you have a fracture boot this would be appropriate to wear.  Otherwise I would like to follow-up with you next week and we can discuss this in further detail.

## 2017-11-13 ENCOUNTER — Ambulatory Visit: Payer: Medicare Other | Admitting: Sports Medicine

## 2017-11-16 ENCOUNTER — Ambulatory Visit (INDEPENDENT_AMBULATORY_CARE_PROVIDER_SITE_OTHER): Payer: Medicare Other | Admitting: Sports Medicine

## 2017-11-16 ENCOUNTER — Encounter: Payer: Self-pay | Admitting: Sports Medicine

## 2017-11-16 VITALS — BP 136/72 | HR 37 | Ht 64.0 in | Wt 143.0 lb

## 2017-11-16 DIAGNOSIS — R269 Unspecified abnormalities of gait and mobility: Secondary | ICD-10-CM

## 2017-11-16 DIAGNOSIS — M84373A Stress fracture, unspecified ankle, initial encounter for fracture: Secondary | ICD-10-CM

## 2017-11-16 DIAGNOSIS — R29898 Other symptoms and signs involving the musculoskeletal system: Secondary | ICD-10-CM | POA: Insufficient documentation

## 2017-11-16 DIAGNOSIS — M76829 Posterior tibial tendinitis, unspecified leg: Secondary | ICD-10-CM

## 2017-11-16 NOTE — Assessment & Plan Note (Signed)
Previously undergone right hip abductor repair with Dr. Ihor Gully.   Referral back to [facility] to resume exercises with this but I would like for them to hold off on working with her ankle until I see her back at 4-week mark.  Hip abduction weakness and medial malleolus symptoms are likely associated and should be helped once we get her feeling better by the orthotics that were previously made.  She should continue with these.

## 2017-11-16 NOTE — Assessment & Plan Note (Signed)
Given the bony edema within the medial malleolus this is consistent with a medial malleolus stress fracture.  We discussed multiple options including boot immobilization but given her fragility as well as fall risk I would like to try to avoid doing this and she understands that immobilization is the most important aspect of this.  We will have her continue with Body Helix compression sleeve and add an additional Aircast stirrup brace.  She understands that if she is having persistent ongoing symptoms in 4 weeks with progress and the Aircast boot immobilization will be required.  Recommend frequent cold water submersion/ice soaking.

## 2017-11-16 NOTE — Assessment & Plan Note (Signed)
Can consider nitroglycerin therapy once bony edema process has improved.

## 2017-11-16 NOTE — Progress Notes (Signed)
[name] [name], [place] [facility]  [name] - 76 y.o. female MRN [id number]  Date of birth: 08/04/1941  Visit Date: 11/16/2017  PCP: Biagio Borg, MD   Referred by: Biagio Borg, MD   Scribe for today's visit: Josepha Pigg, CMA    SUBJECTIVE:  [name] is here for Follow-up (MRI RT ankle review)  10/30/17 summary: Compared to the last office visit on 10/09/17, her previously described symptoms are worsening in terms of her R medial ankle.  She states that she can barely walk w/o her R ankle brace on.  She notes that she wears it continuously other than when she's sleeping. Current symptoms are mild & are nonradiating She has been using her custom-made orthotics and her ankle brace.      Compared to the last office visit, her previously described symptoms show no change  Current symptoms are mild & are nonradiating She has been wearing ankle brace and has custom orthotics. She has been using topical Voltaren gel with minimal relief.   MRI RT ankle results:  IMPRESSION: 1. 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 Subcortical reactive marrow edema in the posterior aspect of the medial malleolus adjacent to the posterior tibial tendon. 2. Mild osteoarthritis of the calcaneocuboid joint.   ROS Denies night time disturbances. Denies fevers, chills, or night sweats. Denies unexplained weight loss. Reports personal history of cancer, basal cell carcinoma. Denies changes in bowel or bladder habits. Denies recent unreported falls. Denies new or worsening dyspnea or wheezing. Denies headaches or dizziness.  Denies numbness, tingling or weakness  In the extremities.  Denies dizziness or presyncopal episodes Reports lower extremity edema, taking Lasix     HISTORY & PERTINENT PRIOR DATA:  Prior History reviewed and updated per electronic  medical record.  Significant history, findings, studies and interim changes include:  reports that she quit smoking about 21 years ago. Her smoking use included cigarettes. She started smoking about 63 years ago. She has a 126.00 pack-year smoking history. she has never used smokeless tobacco. Recent Labs    04/26/17 0801 10/13/17 1431  HGBA1C 6.3 5.8   No specialty comments available. Problem  Weakness of Right Hip  Stress Fracture of Ankle, Initial Encounter   11/09/2017: MRI right ankle: Medial malleolus bony edema consistent with stress fracture with underlying posterior tibialis tendinosis with a small partial-thickness tear and mild tenosynovitis   Posterior Tibial Tendon Dysfunction     OBJECTIVE:  VS:  HT:5\' 4"  (162.6 cm)   WT:143 lb (64.9 kg)  BMI:24.53    BP:136/72  HR:(!) 37bpm  TEMP: ( )  RESP:96 %  PHYSICAL EXAM: Constitutional: WDWN, Non-toxic appearing. Psychiatric: Alert & appropriately interactive. Not depressed or anxious appearing. Respiratory: No increased work of breathing. Trachea Midline Eyes: Pupils are equal. EOM intact without nystagmus. No scleral icterus Cardiovascular:  Peripheral Pulses: pedal pulses decreased or absent bilateral No clubbing or cyanosis appreciated Capillary Refill is normal, less than 2 seconds Sensory Exam: intact to light touch  Right ankle: Marked TDP over the medial malleolus.  Mild pain over the posterior tibialis tendon and muscle belly.  No significant pain along the navicular.  Ankle dorsiflexion, plantarflexion strength intact.  She has splay toe of the first and second toes.  She walks with an antalgic gait.  Slight hip lurch.  Moderate DJD over the greater trochanter on the right  ASSESSMENT & PLAN:   1. Weakness of right hip   2. Gait disturbance   3. Posterior tibial tendon dysfunction   4. Stress fracture of ankle, initial encounter    PLAN:    Stress fracture of ankle, initial encounter Given the bony  edema within the medial malleolus this is consistent with a medial malleolus stress fracture.  We discussed multiple options including boot immobilization but given her fragility as well as fall risk I would like to try to avoid doing this and she understands that immobilization is the most important aspect of this.  We will have her continue with Body Helix compression sleeve and add an additional Aircast stirrup brace.  She understands that if she is having persistent ongoing symptoms in 4 weeks with progress and the Aircast boot immobilization will be required.  Recommend frequent cold water submersion/ice soaking.  Posterior tibial tendon dysfunction Can consider nitroglycerin therapy once bony edema process has improved.  Weakness of right hip Previously undergone right hip abductor repair with Dr. Ihor Gully.   Referral back to [facility] to resume exercises with this but I would like for them to hold off on working with her ankle until I see her back at 4-week mark.  Hip abduction weakness and medial malleolus symptoms are likely associated and should be helped once we get her feeling better by the orthotics that were previously made.  She should continue with these.   ++++++++++++++++++++++++++++++++++++++++++++ Orders & Meds: Orders Placed This Encounter  Procedures  . Ambulatory referral to Physical Therapy    No orders of the defined types were placed in this encounter.   ++++++++++++++++++++++++++++++++++++++++++++ Follow-up: Return in about 4 weeks (around 12/14/2017).   Pertinent documentation may be included in additional procedure notes, imaging studies, problem based documentation and patient instructions. Please see these sections of the encounter for additional information regarding this visit. CMA/ATC served as Education administrator during this visit. History, Physical, and Plan performed by medical provider. Documentation and orders reviewed and attested to.      Gerda Diss, [facility] Physician

## 2017-11-21 ENCOUNTER — Encounter: Payer: Self-pay | Admitting: Pulmonary Disease

## 2017-11-21 ENCOUNTER — Other Ambulatory Visit: Payer: Medicare Other

## 2017-11-21 ENCOUNTER — Ambulatory Visit (INDEPENDENT_AMBULATORY_CARE_PROVIDER_SITE_OTHER): Payer: Medicare Other | Admitting: Pulmonary Disease

## 2017-11-21 VITALS — BP 132/78 | HR 77 | Ht 64.0 in | Wt 145.0 lb

## 2017-11-21 DIAGNOSIS — M76829 Posterior tibial tendinitis, unspecified leg: Secondary | ICD-10-CM | POA: Diagnosis not present

## 2017-11-21 DIAGNOSIS — K219 Gastro-esophageal reflux disease without esophagitis: Secondary | ICD-10-CM | POA: Diagnosis not present

## 2017-11-21 DIAGNOSIS — J449 Chronic obstructive pulmonary disease, unspecified: Secondary | ICD-10-CM

## 2017-11-21 DIAGNOSIS — R269 Unspecified abnormalities of gait and mobility: Secondary | ICD-10-CM | POA: Diagnosis not present

## 2017-11-21 DIAGNOSIS — J309 Allergic rhinitis, unspecified: Secondary | ICD-10-CM | POA: Diagnosis not present

## 2017-11-21 DIAGNOSIS — M84373S Stress fracture, unspecified ankle, sequela: Secondary | ICD-10-CM | POA: Diagnosis not present

## 2017-11-21 DIAGNOSIS — R29898 Other symptoms and signs involving the musculoskeletal system: Secondary | ICD-10-CM | POA: Diagnosis not present

## 2017-11-21 MED ORDER — IPRATROPIUM BROMIDE 0.03 % NA SOLN: 2.0000 | Freq: Four times a day (QID) | NASAL | 12 refills | Status: DC | PRN

## 2017-11-21 NOTE — Patient Instructions (Addendum)
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 0923;30(0 Pt 2):1123. nd J Allergy Clin Immunol. 7622;63(3 Pt 2):1117).  -start ipratropium nasal spray (0.03%) 2 puffs bid-tid prn  Allergic rhinitis: This is been more of a problem for use we will check a serum IgE to see if there is ongoing allergy causing this problem.  Sometimes we need to treat this with a medicine called Xolair, this is why we are doing the blood test I would like for you to start taking over-the-counter cetirizine 1 pill daily Continue Singulair Continue Astelin Continue Flonase  COPD: Continue taking Symbicort and Spiriva  We will see you back in 6-8 weeks or sooner if needed

## 2017-11-21 NOTE — Progress Notes (Signed)
Subjective:   PATIENT ID: [name]: female DOB: [birth year], MRN: [id number]  Synopsis: Referred in 2017, followed by Dr. Ashok Cordia previously for COPD, allergic rhinitis, GERD  HPI  Chief Complaint  Patient presents with  . Follow-up    former JN pt. pt reports of voice hoarseness   [name] says that she continues to have hoarseness in her throat.  She was sent to an ENT since the last visit.  She says that the ENT started a new PPI and this didn't help.  The hoarseness has not worsened.  The ENT didn't see an anatomical problem and thought she had reflux.  She continues to have post nasal drip and sinus congestion.  She takes singulair, asteline and flonase.  She uses Milta Deiters Med rinses daily and produces a lot of sinus mucus.  She has never been to an allergist.  She used to see Dr. Joya Gaskins in the past.  She says that Dr. Joya Gaskins called it vasomotor rhinitis.  No breathing difficulty or bronchitis lately.  No GERD.    Past Medical History:  Diagnosis Date  . Allergic rhinitis   . Anxiety   . Atrial septal aneurysm   . Basal cell carcinoma   . Bradycardia   . Cataract    bil cataracts removed  . Chronic combined systolic and diastolic CHF (congestive heart failure) ([place])   . CKD (chronic kidney disease), stage III ([place])   . Clostridium difficile infection   . COPD (chronic obstructive pulmonary disease) ([place])   . Depression 11/29/2013  . DI (detrusor instability)   . Esophageal stricture   . GERD (gastroesophageal reflux disease)   . Hemorrhoids   . Hiatal hernia   . History of kidney stones   . HLD (hyperlipidemia)   . HTN (hypertension)   . Ischemic colon ([place])    a. 08/2016 - adm to  [facility] with a ruptured colon s/p right hemicolectomy with ileostomy formation for ischemic and necrotic colon.   . Lung nodule    a. followed by pulmonology  . Mild CAD    a. minor nonobstructive CAD 08/22/16.  . Nephrolithiasis    hx  . NICM (nonischemic cardiomyopathy)  ([place])   . Orthostatic hypotension   . Osteoarthritis   . PAF (paroxysmal atrial fibrillation) ([place])    a. diagnosed 07/2016 s/p DCCV 08/01/16 with recurrence, managed with rate control for now.   . Pneumonia 2013  . PVC's (premature ventricular contractions)   . Shingles since Nov 18, 2013   on right arm from shoulder to wrist  . Status post dilation of esophageal narrowing   . Stroke [facility]) 2001   STROKES, TIA'S  . Syncope    a. 08/2016 felt due to orthostatic hypotension.  Marland Kitchen TIA (transient ischemic attack) last 2001   anticoagulation therapy on coumadin     Family History  Problem Relation Age of Onset  . COPD Mother   . Breast cancer Mother        mid 34's  . Uterine cancer Mother   . Emphysema Mother   . Breast cancer Maternal Aunt        x 3 aunts, post menopausal  . Breast cancer Maternal Aunt   . Breast cancer Maternal Aunt   . Colon cancer Neg Hx   . Esophageal cancer Neg Hx   . Rectal cancer Neg Hx   . Stomach cancer Neg Hx   . Pancreatic cancer Neg Hx  Social History   Socioeconomic History  . Marital status: Married    Spouse name: [name]  . Number of children: 2  . Years of education: Not on file  . Highest education level: Not on file  Social Needs  . Financial resource strain: Not on file  . Food insecurity - worry: Not on file  . Food insecurity - inability: Not on file  . Transportation needs - medical: Not on file  . Transportation needs - non-medical: Not on file  Occupational History  . Occupation: retired    Fish farm manager: RETIRED  Tobacco Use  . Smoking status: Former Smoker    Packs/day: 3.00    Years: 42.00    Pack years: 126.00    Types: Cigarettes    Start date: 08/04/1954    Last attempt to quit: 12/06/1995    Years since quitting: 21.9  . Smokeless tobacco: Never Used  Substance and Sexual Activity  . Alcohol use: No    Alcohol/week: 0.0 oz    Comment: occas very little  . Drug use: No  . Sexual activity: Not Currently    Birth  control/protection: Surgical  Other Topics Concern  . Not on file  Social History Narrative   Retired- Dentist Pulmonary:   Originally from [place]. Has always lived in [place]. She has traveled to [place], [place], [place], [place], [place], [place]. Has been on multiple cruises. 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 She did use a face mask consistently to avoid chemical fume exposure. She may have only had an inhaled exposure a couple of times. She currently has a dog. Remote exposure to a parakeet. No mold or hot tub exposure. No asbestos exposure.      Allergies  Allergen Reactions  . Clarithromycin Other (See Comments)    REACTION: possible rash but could have been legionarres dz with rash  . Lipitor [Atorvastatin] Other (See Comments)    MUSCLE SPASMS  . Simvastatin Other (See Comments)    Muscle and joint pain  . Cardizem [Diltiazem Hcl] Hives  . Contrast Media [Iodinated Diagnostic Agents]     FYI - during admission at [facility] 08/2016 patient developed hives, question related to cardizem or contrast dye - not clear  . Crestor [Rosuvastatin Calcium]     fagitue and joint pain, "NO STATINS"     Outpatient Medications Prior to Visit  Medication Sig Dispense Refill  . albuterol (PROVENTIL HFA;VENTOLIN HFA) 108 (90 Base) MCG/ACT inhaler Inhale 1-2 puffs into the lungs every 6 (six) hours as needed for wheezing or shortness of breath. 3 Inhaler 4  . apixaban (ELIQUIS) 5 MG TABS tablet Take 1 tablet (5 mg total) by mouth 2 (two) times daily. 180 tablet 2  . azelastine (ASTELIN) 0.1 % nasal spray Place 2 sprays into the nose daily. Use in each nostril as directed (Patient taking differently: Place 2 sprays into both nostrils daily. ) 90 mL 1  . benzonatate (TESSALON PERLES) 100 MG capsule Take 1 capsule (100 mg total) 3 (three) times daily as needed by mouth for cough. 270  capsule 0  . budesonide-formoterol (SYMBICORT) 160-4.5 MCG/ACT inhaler Inhale 2 puffs into the lungs 2 (two) times daily. 3 Inhaler 4  . buPROPion (WELLBUTRIN XL) 300 MG 24 hr tablet Take 1 tablet (300 mg total) by mouth daily. 30 tablet 0  . Calcium Carbonate-Vitamin D (CALCIUM + D PO) Take  1 tablet by mouth 2 (two) times daily.     . ciclopirox (PENLAC) 8 % solution Apply at bedtime topically. Apply over nail and surrounding skin. Apply daily over previous coat. After seven (7) days, may remove with alcohol and continue cycle. 6.6 mL 5  . cyclobenzaprine (FLEXERIL) 5 MG tablet Take 1 tablet (5 mg total) by mouth 3 (three) times daily as needed for muscle spasms. 270 tablet 0  . ezetimibe (ZETIA) 10 MG tablet Take 1 tablet (10 mg total) daily by mouth. 90 tablet 3  . fluticasone (FLONASE) 50 MCG/ACT nasal spray Place 1 spray into both nostrils 2 (two) times daily. 48 g 1  . furosemide (LASIX) 20 MG tablet Take 1 tablet (20 mg total) by mouth as needed (swelling). 90 tablet 0  . gabapentin (NEURONTIN) 300 MG capsule 1 tab by mouth in AM, 1 po at midday, then 2 by mouth at bedtime for sleep and pain 120 capsule 5  . guaiFENesin (MUCINEX) 600 MG 12 hr tablet Take 1,200 mg by mouth 2 (two) times daily as needed for cough or to loosen phlegm.    Marland Kitchen levothyroxine (SYNTHROID, LEVOTHROID) 25 MCG tablet Take 1 tablet (25 mcg total) by mouth daily before breakfast. 30 tablet 0  . Lidocaine HCl 2 % CREA Apply 1 application topically daily as needed (for shingles flares).    . LORazepam (ATIVAN) 0.5 MG tablet 1-2 tab by mouth at bedtime as needed for sleep 60 tablet 5  . Melatonin 5 MG TABS Take 5 mg by mouth at bedtime.    . mirabegron ER (MYRBETRIQ) 50 MG TB24 tablet Take 50 mg by mouth daily.    . montelukast (SINGULAIR) 10 MG tablet Take 1 tablet (10 mg total) daily by mouth. 90 tablet 1  . Multiple Vitamin (MULTIVITAMIN) tablet Take 1 tablet by mouth daily.      Marland Kitchen nystatin (MYCOSTATIN) 100000 UNIT/ML  suspension Take 5 cc by mouth, swish and swallow 4 times daily. 280 mL 0  . omeprazole (PRILOSEC) 40 MG capsule Take 1 capsule (40 mg total) by mouth daily. 90 capsule 3  . Probiotic Product (PROBIOTIC DAILY PO) Take by mouth.    . ranitidine (ZANTAC) 150 MG tablet Take 1 tablet (150 mg total) by mouth at bedtime. 30 tablet 3  . Respiratory Therapy Supplies (FLUTTER) DEVI Use 2-3 times per day 1 each 0  . Spacer/Aero-Holding Chambers (AEROCHAMBER Z-STAT PLUS) inhaler Use as instructed 1 each 0  . Tiotropium Bromide Monohydrate (SPIRIVA RESPIMAT) 2.5 MCG/ACT AERS Inhale 2 puffs into the lungs daily. 3 Inhaler 3  . metoprolol succinate (TOPROL-XL) 100 MG 24 hr tablet Take 1 tablet (100 mg total) by mouth daily. Take with or immediately following a meal. 90 tablet 3   No facility-administered medications prior to visit.     ROS    Objective:  Physical Exam   Vitals:   11/21/17 1038 11/21/17 1040  BP: 130/78 132/78  Pulse: 81 77  SpO2: 93% 95%  Weight:  145 lb (65.8 kg)  Height:  5\' 4"  (1.626 m)    Gen: well appearing HENT: OP clear, TM's clear, neck supple PULM: CTA B, normal percussion CV: RRR, no mgr, trace edema GI: BS+, soft, nontender Derm: no cyanosis or rash Psyche: normal mood and affect   CBC    Component Value Date/Time   WBC 5.6 10/13/2017 1431   RBC 5.41 (H) 10/13/2017 1431   HGB 12.2 10/13/2017 1431   HCT 38.5 10/13/2017 1431  PLT 201.0 10/13/2017 1431   MCV 71.1 (L) 10/13/2017 1431   MCH 24.8 (L) 09/16/2016 1648   MCHC 31.6 10/13/2017 1431   RDW 18.4 (H) 10/13/2017 1431   LYMPHSABS 1.7 10/13/2017 1431   MONOABS 0.8 10/13/2017 1431   EOSABS 0.1 10/13/2017 1431   BASOSABS 0.0 10/13/2017 1431     PFT 10/10/16: FVC 1.94 L (70%) FEV1 0.93 L (45%) FEV1/FVC 0.48 FEF 25-75 0.35 L (22%) positive bronchodilator response 03/01/16: FVC 2.55 L (91%) FEV1 1.30 L (62%) FEV1/FVC 0.51 FEF 25-75 0.56 L (34%) negative bronchodilator response TLC 5.66 L (111%) RV  116% ERV 133% DLCO uncorrected 62% (hemoglobin 15.1) 12/28/11:FVC 2.89 L (104%) FEV1 1.22 L (62%) FEV1/FVC 0.42 FEF 25-75 0.33 L (15%) negative bronchodilator response TLC 6.19 L (129%) RV 163% ERV 114% DLCO uncorrected 69%  6MWT 10/10/16:  Walked 96 meters / Baseline Sat 93% on RA / Nadir Sat 93% on RA @ rest (couldn't finish due to weakness & dyspnea  W/ 2:57 left) 08/24/16:  Walked 1 lap & was 96% at lowest on room air  IMAGING CXR PA/LAT 06/19/17 :  Hyperinflation suggested by flattening of the diaphragms. Hiatal hernia noted. Resolution of right lung opacity. No new opacity or effusion appreciated. Heart normal in size & mediastinum normal in contour.  CXR PA/LAT 05/18/17:  Patchy opacification in right middle and lower lobes. No pleural effusion appreciated. Heart normal in size & mediastinum normal in contour.  CT CHEST W/O 01/23/17: Resolution of groundglass opacity. 7 mm right lower lobe nodule unchanged. Apical predominant emphysematous changes. Right middle lobe nodule was not appreciated on this image. No pleural effusion or thickening. No pathologic mediastinal adenopathy. No pericardial effusion.  CTA CHEST 08/17/16:  No evidence of PE. No pleural effusion or thickening. No pathologic mediastinal adenopathy. Groundglass noted within right lung predominantly in the upper lung zone. Moderate upper lobe predominant emphysema which has progressed since 2015. 7 mm right lower lobe nodule has not changed since 2015. Patient has a 4 mm nodule within right middle lobe which appears new.  CXR PA/LAT 05/25/16: Hyperinflation with flattening of the diaphragms. No parenchymal opacity or mass appreciated. No pleural effusion. Heart normal in size & mediastinum normal in contour.  CXR PA/LAT 03/22/16:  Hyperinflation with elevation of left hemidiaphragm. No new focal opacity. Heart normal in size & mediastinum normal in contour.  CXR PA/LAT 11/09/15: No focal opacity or effusion appreciated. Heart  normal in size. Mediastinum normal in contour. Mild hyperinflation with flattening of the diaphragms.   CARDIAC TTE (06/24/16): LVEF 45-50% with grade 2 diastolic dysfunction. LA moderately to severely dilated. RA normal in size. RV normal in size and function. No aortic stenosis or regurgitation. Moderate centrally directed mitral regurgitation. Paradoxical ventricular septal motion consistent with intraventricular conduction delay. No pulmonic regurgitation. No tricuspid regurgitation. No pericardial effusion.  MICROBIOLOGY Sputum Culture (07/05/17):  Candida Species / AFB negative / Moderate Squamous Epithelials   LABS 12/22/15 Alpha-1 antitrypsin: MM (143)       Assessment & Plan:   No diagnosis found.  Discussion: From a COPD standpoint this has been a stable interval for normal.  However, she continues to struggle with hoarseness which I believe is due to both allergic rhinitis and vasomotor rhinitis.  Certainly acid reflux contributes as well but she feels that this is well controlled on her 2 separate antacid therapies.  It sounds as if the vasomotor rhinitis has been worsening recently is still not well controlled.  Get a serum  IgE to see if she has ongoing allergy.  We will start ipratropium nasal spray and have her start taking an over-the-counter antihistamine.  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 4315;40(0 Pt 2):1123. nd J Allergy Clin Immunol. 8676;19(5 Pt 2):1117).  -start ipratropium nasal spray (0.03%) 2 puffs bid-tid prn  Allergic rhinitis: This is been more of a problem for use we will check a serum IgE to see if there is ongoing allergy causing this problem.  Sometimes we need to treat this with a medicine called Xolair,  this is why we are doing the blood test I would like for you to start taking over-the-counter cetirizine 1 pill daily Continue Singulair Continue Astelin Continue Flonase  COPD: Continue taking Symbicort and Spiriva  We will see you back in 6-8 weeks or sooner if needed    Current Outpatient Medications:  .  albuterol (PROVENTIL HFA;VENTOLIN HFA) 108 (90 Base) MCG/ACT inhaler, Inhale 1-2 puffs into the lungs every 6 (six) hours as needed for wheezing or shortness of breath., Disp: 3 Inhaler, Rfl: 4 .  apixaban (ELIQUIS) 5 MG TABS tablet, Take 1 tablet (5 mg total) by mouth 2 (two) times daily., Disp: 180 tablet, Rfl: 2 .  azelastine (ASTELIN) 0.1 % nasal spray, Place 2 sprays into the nose daily. Use in each nostril as directed (Patient taking differently: Place 2 sprays into both nostrils daily. ), Disp: 90 mL, Rfl: 1 .  benzonatate (TESSALON PERLES) 100 MG capsule, Take 1 capsule (100 mg total) 3 (three) times daily as needed by mouth for cough., Disp: 270 capsule, Rfl: 0 .  budesonide-formoterol (SYMBICORT) 160-4.5 MCG/ACT inhaler, Inhale 2 puffs into the lungs 2 (two) times daily., Disp: 3 Inhaler, Rfl: 4 .  buPROPion (WELLBUTRIN XL) 300 MG 24 hr tablet, Take 1 tablet (300 mg total) by mouth daily., Disp: 30 tablet, Rfl: 0 .  Calcium Carbonate-Vitamin D (CALCIUM + D PO), Take 1 tablet by mouth 2 (two) times daily. , Disp: , Rfl:  .  ciclopirox (PENLAC) 8 % solution, Apply at bedtime topically. Apply over nail and surrounding skin. Apply daily over previous coat. After seven (7) days, may remove with alcohol and continue cycle., Disp: 6.6 mL, Rfl: 5 .  cyclobenzaprine (FLEXERIL) 5 MG tablet, Take 1 tablet (5 mg total) by mouth 3 (three) times daily as needed for muscle spasms., Disp: 270 tablet, Rfl: 0 .  ezetimibe (ZETIA) 10 MG tablet, Take 1 tablet (10 mg total) daily by mouth., Disp: 90 tablet, Rfl: 3 .  fluticasone (FLONASE) 50 MCG/ACT nasal spray, Place 1 spray into both nostrils 2  (two) times daily., Disp: 48 g, Rfl: 1 .  furosemide (LASIX) 20 MG tablet, Take 1 tablet (20 mg total) by mouth as needed (swelling)., Disp: 90 tablet, Rfl: 0 .  gabapentin (NEURONTIN) 300 MG capsule, 1 tab by mouth in AM, 1 po at midday, then 2 by mouth at bedtime for sleep and pain, Disp: 120 capsule, Rfl: 5 .  guaiFENesin (MUCINEX) 600 MG 12 hr tablet, Take 1,200 mg by mouth 2 (two) times daily as needed for cough or to loosen phlegm., Disp: , Rfl:  .  levothyroxine (SYNTHROID, LEVOTHROID) 25 MCG tablet, Take 1 tablet (25 mcg total) by mouth daily before breakfast., Disp: 30 tablet, Rfl: 0 .  Lidocaine  HCl 2 % CREA, Apply 1 application topically daily as needed (for shingles flares)., Disp: , Rfl:  .  LORazepam (ATIVAN) 0.5 MG tablet, 1-2 tab by mouth at bedtime as needed for sleep, Disp: 60 tablet, Rfl: 5 .  Melatonin 5 MG TABS, Take 5 mg by mouth at bedtime., Disp: , Rfl:  .  mirabegron ER (MYRBETRIQ) 50 MG TB24 tablet, Take 50 mg by mouth daily., Disp: , Rfl:  .  montelukast (SINGULAIR) 10 MG tablet, Take 1 tablet (10 mg total) daily by mouth., Disp: 90 tablet, Rfl: 1 .  Multiple Vitamin (MULTIVITAMIN) tablet, Take 1 tablet by mouth daily.  , Disp: , Rfl:  .  nystatin (MYCOSTATIN) 100000 UNIT/ML suspension, Take 5 cc by mouth, swish and swallow 4 times daily., Disp: 280 mL, Rfl: 0 .  omeprazole (PRILOSEC) 40 MG capsule, Take 1 capsule (40 mg total) by mouth daily., Disp: 90 capsule, Rfl: 3 .  Probiotic Product (PROBIOTIC DAILY PO), Take by mouth., Disp: , Rfl:  .  ranitidine (ZANTAC) 150 MG tablet, Take 1 tablet (150 mg total) by mouth at bedtime., Disp: 30 tablet, Rfl: 3 .  Respiratory Therapy Supplies (FLUTTER) DEVI, Use 2-3 times per day, Disp: 1 each, Rfl: 0 .  Spacer/Aero-Holding Chambers (AEROCHAMBER Z-STAT PLUS) inhaler, Use as instructed, Disp: 1 each, Rfl: 0 .  Tiotropium Bromide Monohydrate (SPIRIVA RESPIMAT) 2.5 MCG/ACT AERS, Inhale 2 puffs into the lungs daily., Disp: 3 Inhaler,  Rfl: 3 .  metoprolol succinate (TOPROL-XL) 100 MG 24 hr tablet, Take 1 tablet (100 mg total) by mouth daily. Take with or immediately following a meal., Disp: 90 tablet, Rfl: 3

## 2017-11-22 LAB — IGE: IgE (Immunoglobulin E), Serum: 9 kU/L (ref <=114)

## 2017-11-29 DIAGNOSIS — Z9889 Other specified postprocedural states: Secondary | ICD-10-CM

## 2017-11-29 DIAGNOSIS — I2510 Atherosclerotic heart disease of native coronary artery without angina pectoris: Secondary | ICD-10-CM | POA: Insufficient documentation

## 2017-11-29 DIAGNOSIS — I5042 Chronic combined systolic (congestive) and diastolic (congestive) heart failure: Secondary | ICD-10-CM | POA: Insufficient documentation

## 2017-11-29 DIAGNOSIS — N183 Chronic kidney disease, stage 3 unspecified: Secondary | ICD-10-CM | POA: Insufficient documentation

## 2017-11-29 DIAGNOSIS — I428 Other cardiomyopathies: Secondary | ICD-10-CM | POA: Insufficient documentation

## 2017-11-29 DIAGNOSIS — K222 Esophageal obstruction: Secondary | ICD-10-CM | POA: Insufficient documentation

## 2017-11-29 DIAGNOSIS — Z87442 Personal history of urinary calculi: Secondary | ICD-10-CM | POA: Insufficient documentation

## 2017-11-29 DIAGNOSIS — N200 Calculus of kidney: Secondary | ICD-10-CM | POA: Insufficient documentation

## 2017-11-29 DIAGNOSIS — M76829 Posterior tibial tendinitis, unspecified leg: Secondary | ICD-10-CM | POA: Diagnosis not present

## 2017-11-29 DIAGNOSIS — B029 Zoster without complications: Secondary | ICD-10-CM | POA: Insufficient documentation

## 2017-11-29 DIAGNOSIS — K219 Gastro-esophageal reflux disease without esophagitis: Secondary | ICD-10-CM | POA: Insufficient documentation

## 2017-11-29 DIAGNOSIS — R001 Bradycardia, unspecified: Secondary | ICD-10-CM | POA: Insufficient documentation

## 2017-11-29 DIAGNOSIS — M84373S Stress fracture, unspecified ankle, sequela: Secondary | ICD-10-CM | POA: Diagnosis not present

## 2017-11-29 DIAGNOSIS — I493 Ventricular premature depolarization: Secondary | ICD-10-CM | POA: Insufficient documentation

## 2017-11-29 DIAGNOSIS — Z8719 Personal history of other diseases of the digestive system: Secondary | ICD-10-CM | POA: Insufficient documentation

## 2017-11-29 DIAGNOSIS — K449 Diaphragmatic hernia without obstruction or gangrene: Secondary | ICD-10-CM | POA: Insufficient documentation

## 2017-11-29 DIAGNOSIS — A498 Other bacterial infections of unspecified site: Secondary | ICD-10-CM | POA: Insufficient documentation

## 2017-11-29 DIAGNOSIS — K559 Vascular disorder of intestine, unspecified: Secondary | ICD-10-CM | POA: Insufficient documentation

## 2017-11-29 DIAGNOSIS — I10 Essential (primary) hypertension: Secondary | ICD-10-CM | POA: Insufficient documentation

## 2017-11-29 DIAGNOSIS — I951 Orthostatic hypotension: Secondary | ICD-10-CM | POA: Insufficient documentation

## 2017-11-29 DIAGNOSIS — J309 Allergic rhinitis, unspecified: Secondary | ICD-10-CM | POA: Insufficient documentation

## 2017-11-29 DIAGNOSIS — I253 Aneurysm of heart: Secondary | ICD-10-CM | POA: Insufficient documentation

## 2017-11-29 DIAGNOSIS — R269 Unspecified abnormalities of gait and mobility: Secondary | ICD-10-CM | POA: Diagnosis not present

## 2017-11-29 DIAGNOSIS — R29898 Other symptoms and signs involving the musculoskeletal system: Secondary | ICD-10-CM | POA: Diagnosis not present

## 2017-11-29 DIAGNOSIS — G459 Transient cerebral ischemic attack, unspecified: Secondary | ICD-10-CM | POA: Insufficient documentation

## 2017-11-29 DIAGNOSIS — F419 Anxiety disorder, unspecified: Secondary | ICD-10-CM | POA: Insufficient documentation

## 2017-11-29 DIAGNOSIS — H269 Unspecified cataract: Secondary | ICD-10-CM | POA: Insufficient documentation

## 2017-12-01 DIAGNOSIS — M84373S Stress fracture, unspecified ankle, sequela: Secondary | ICD-10-CM | POA: Diagnosis not present

## 2017-12-01 DIAGNOSIS — R269 Unspecified abnormalities of gait and mobility: Secondary | ICD-10-CM | POA: Diagnosis not present

## 2017-12-01 DIAGNOSIS — M76829 Posterior tibial tendinitis, unspecified leg: Secondary | ICD-10-CM | POA: Diagnosis not present

## 2017-12-01 DIAGNOSIS — R29898 Other symptoms and signs involving the musculoskeletal system: Secondary | ICD-10-CM | POA: Diagnosis not present

## 2017-12-04 DIAGNOSIS — M76829 Posterior tibial tendinitis, unspecified leg: Secondary | ICD-10-CM | POA: Diagnosis not present

## 2017-12-04 DIAGNOSIS — R269 Unspecified abnormalities of gait and mobility: Secondary | ICD-10-CM | POA: Diagnosis not present

## 2017-12-04 DIAGNOSIS — R29898 Other symptoms and signs involving the musculoskeletal system: Secondary | ICD-10-CM | POA: Diagnosis not present

## 2017-12-04 DIAGNOSIS — M84373S Stress fracture, unspecified ankle, sequela: Secondary | ICD-10-CM | POA: Diagnosis not present

## 2017-12-07 DIAGNOSIS — R29898 Other symptoms and signs involving the musculoskeletal system: Secondary | ICD-10-CM | POA: Diagnosis not present

## 2017-12-07 DIAGNOSIS — R269 Unspecified abnormalities of gait and mobility: Secondary | ICD-10-CM | POA: Diagnosis not present

## 2017-12-07 DIAGNOSIS — M84373S Stress fracture, unspecified ankle, sequela: Secondary | ICD-10-CM | POA: Diagnosis not present

## 2017-12-07 DIAGNOSIS — M76829 Posterior tibial tendinitis, unspecified leg: Secondary | ICD-10-CM | POA: Diagnosis not present

## 2017-12-09 ENCOUNTER — Encounter: Payer: Self-pay | Admitting: Pulmonary Disease

## 2017-12-10 ENCOUNTER — Encounter: Payer: Self-pay | Admitting: Internal Medicine

## 2017-12-11 MED ORDER — RANITIDINE HCL 150 MG PO TABS: 150.0000 mg | ORAL_TABLET | Freq: Every day | ORAL | 1 refills | Status: DC

## 2017-12-11 NOTE — Telephone Encounter (Signed)
PCC's to note pt concern

## 2017-12-12 DIAGNOSIS — R29898 Other symptoms and signs involving the musculoskeletal system: Secondary | ICD-10-CM | POA: Diagnosis not present

## 2017-12-12 DIAGNOSIS — R269 Unspecified abnormalities of gait and mobility: Secondary | ICD-10-CM | POA: Diagnosis not present

## 2017-12-12 DIAGNOSIS — M84373S Stress fracture, unspecified ankle, sequela: Secondary | ICD-10-CM | POA: Diagnosis not present

## 2017-12-12 DIAGNOSIS — M76829 Posterior tibial tendinitis, unspecified leg: Secondary | ICD-10-CM | POA: Diagnosis not present

## 2017-12-14 ENCOUNTER — Encounter: Payer: Self-pay | Admitting: Cardiology

## 2017-12-14 ENCOUNTER — Ambulatory Visit (INDEPENDENT_AMBULATORY_CARE_PROVIDER_SITE_OTHER): Payer: Medicare Other | Admitting: Cardiology

## 2017-12-14 VITALS — BP 116/62 | HR 71 | Ht 64.0 in | Wt 143.0 lb

## 2017-12-14 DIAGNOSIS — I10 Essential (primary) hypertension: Secondary | ICD-10-CM

## 2017-12-14 DIAGNOSIS — I480 Paroxysmal atrial fibrillation: Secondary | ICD-10-CM | POA: Diagnosis not present

## 2017-12-14 DIAGNOSIS — I493 Ventricular premature depolarization: Secondary | ICD-10-CM | POA: Diagnosis not present

## 2017-12-14 DIAGNOSIS — M76829 Posterior tibial tendinitis, unspecified leg: Secondary | ICD-10-CM | POA: Diagnosis not present

## 2017-12-14 DIAGNOSIS — I428 Other cardiomyopathies: Secondary | ICD-10-CM | POA: Diagnosis not present

## 2017-12-14 DIAGNOSIS — R269 Unspecified abnormalities of gait and mobility: Secondary | ICD-10-CM | POA: Diagnosis not present

## 2017-12-14 DIAGNOSIS — R29898 Other symptoms and signs involving the musculoskeletal system: Secondary | ICD-10-CM | POA: Diagnosis not present

## 2017-12-14 DIAGNOSIS — M84373S Stress fracture, unspecified ankle, sequela: Secondary | ICD-10-CM | POA: Diagnosis not present

## 2017-12-14 NOTE — Progress Notes (Signed)
Electrophysiology Office Note   Date:  12/14/2017   ID:  Lauren, Ray [birth year], MRN [id number]  PCP:  Biagio Borg, MD  Primary Electrophysiologist:  Constance Haw, MD    Chief Complaint  Patient presents with  . Follow-up    Persistent Afib/PVC's     History of Present Illness: [name] is a 77 y.o. female who presents today for electrophysiology evaluation.   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 Admitted 08/17/16 with ruptured colon status post right hemicolectomy with ileostomy for ischemic and necrotic colon.  Cardioversion 10/11/16. He also had ileostomy takedown and repair that was complicated by C. difficile.  Today, denies symptoms of palpitations, chest pain, shortness of breath, orthopnea, PND, lower extremity edema, claudication, dizziness, presyncope, syncope, bleeding, or neurologic sequela. The patient is tolerating medications without difficulties.  Feeling well today without complaint.    Past Medical History:  Diagnosis Date  . Allergic rhinitis   . Anxiety   . Atrial septal aneurysm   . Basal cell carcinoma   . Bradycardia   . Cataract    bil cataracts removed  . Chronic combined systolic and diastolic CHF (congestive heart failure) ([place])   . CKD (chronic kidney disease), stage III ([place])   . Clostridium difficile infection   . COPD (chronic obstructive pulmonary disease) ([place])   . Depression 11/29/2013  . DI (detrusor instability)   . Esophageal stricture   . GERD (gastroesophageal reflux disease)   . Hemorrhoids   . Hiatal hernia   . History of kidney stones   . HLD (hyperlipidemia)   . HTN (hypertension)     . Ischemic colon ([place])    a. 08/2016 - adm to  [facility] with a ruptured colon s/p right hemicolectomy with ileostomy formation for ischemic and necrotic colon.   . Lung nodule    a. followed by pulmonology  . Mild CAD    a. minor nonobstructive CAD 08/22/16.  . Nephrolithiasis    hx  . NICM (nonischemic cardiomyopathy) ([place])   . Orthostatic hypotension   . Osteoarthritis   . PAF (paroxysmal atrial fibrillation) ([place])    a. diagnosed 07/2016 s/p DCCV 08/01/16 with recurrence, managed with rate control for now.   . Pneumonia 2013  . PVC's (premature ventricular contractions)   . Shingles since Nov 18, 2013   on right arm from shoulder to wrist  . Status post dilation of esophageal narrowing   . Stroke [facility]) 2001   STROKES, TIA'S  . Syncope    a. 08/2016 felt due to orthostatic hypotension.  Marland Kitchen TIA (transient ischemic attack) last 2001   anticoagulation therapy on coumadin   Past Surgical History:  Procedure Laterality Date  . ANTERIOR AND POSTERIOR VAGINAL REPAIR  2008   A&P REPAIR-DR MCDIARMID  . ANTERIOR LATERAL LUMBAR FUSION 4 LEVELS Right 07/03/2015   Procedure: Right Lumbar one-two, Lumbar two-three, Lumbar three-four, Lumbar four-five Anterior lateral lumbar interbody fusion;  Surgeon: Erline Levine, MD;  Location: [place] [facility]O ORS;  Service: Neurosurgery;  Laterality: Right;  Right L1-2 L2-3 L3-4 L4-5 Anterior lateral lumbar interbody fusion  . BALLOON DILATION N/A 03/03/2014   Procedure: BALLOON DILATION;  Surgeon: Inda Castle, MD;  Location: [facility];  Service: Endoscopy;  Laterality: N/A;  . BLADDER SUSPENSION    . BREAST BIOPSY    . CARDIAC CATHETERIZATION N/A 08/22/2016   Procedure: Left Heart Cath and Coronary Angiography;  Surgeon: Peter M Martinique, MD;  Location: [facility] LAB;  Service: Cardiovascular;  Laterality: N/A;  . CARDIOVERSION N/A 08/01/2016   Procedure: CARDIOVERSION;  Surgeon: Jerline Pain, MD;  Location: [facility];  Service: Cardiovascular;   Laterality: N/A;  . CARDIOVERSION N/A 10/11/2016   Procedure: CARDIOVERSION;  Surgeon: Sueanne Margarita, MD;  Location: [facility];  Service: Cardiovascular;  Laterality: N/A;  . COLONOSCOPY    . ESOPHAGOGASTRODUODENOSCOPY N/A 03/03/2014   Procedure: ESOPHAGOGASTRODUODENOSCOPY (EGD);  Surgeon: Inda Castle, MD;  Location: [facility];  Service: Endoscopy;  Laterality: N/A;  . EXCISION OF BASAL CELL CA  2011  . GLUTEUS MINIMUS REPAIR Right 03/15/2016   Procedure: RIGHT HIP GLUTEUS MEDIUS TENDON REPAIR;  Surgeon: Paralee Cancel, MD;  Location: [facility];  Service: Orthopedics;  Laterality: Right;  . ileostomy reversal    . LUMBAR PERCUTANEOUS PEDICLE SCREW 4 LEVEL Right 07/03/2015   Procedure: Lumbar one-Sacral one Bilateral percutaneous pedicle screws;  Surgeon: Erline Levine, MD;  Location: [place] NEURO ORS;  Service: Neurosurgery;  Laterality: Right;  L1-S1 Bilateral percutaneous pedicle screws  . RECTOCELE REPAIR  2008   A&P REPAIR -DR. MCDIARMID  . THUMB SURGERY Right 10-15 yrs ago  . UPPER GASTROINTESTINAL ENDOSCOPY    . VAGINAL HYSTERECTOMY  1983   NO BSO-DR. MABRY  . VARICOSE VEIN SURGERY       Current Outpatient Medications  Medication Sig Dispense Refill  . albuterol (PROVENTIL HFA;VENTOLIN HFA) 108 (90 Base) MCG/ACT inhaler Inhale 1-2 puffs into the lungs every 6 (six) hours as needed for wheezing or shortness of breath. 3 Inhaler 4  . apixaban (ELIQUIS) 5 MG TABS tablet Take 1 tablet (5 mg total) by mouth 2 (two) times daily. 180 tablet 2  . azelastine (ASTELIN) 0.1 % nasal spray Place 2 sprays into the nose daily. Use in each nostril as directed (Patient taking differently: Place 2 sprays into both nostrils daily. ) 90 mL 1  . benzonatate (TESSALON PERLES) 100 MG capsule Take 1 capsule (100 mg total) 3 (three) times daily as needed by mouth for cough. 270 capsule 0  . budesonide-formoterol (SYMBICORT) 160-4.5 MCG/ACT inhaler Inhale 2 puffs into the lungs 2 (two) times daily. 3 Inhaler 4    . buPROPion (WELLBUTRIN XL) 300 MG 24 hr tablet Take 1 tablet (300 mg total) by mouth daily. 30 tablet 0  . Calcium Carbonate-Vitamin D (CALCIUM + D PO) Take 1 tablet by mouth 2 (two) times daily.     . ciclopirox (PENLAC) 8 % solution Apply at bedtime topically. Apply over nail and surrounding skin. Apply daily over previous coat. After seven (7) days, may remove with alcohol and continue cycle. 6.6 mL 5  . cyclobenzaprine (FLEXERIL) 5 MG tablet Take 1 tablet (5 mg total) by mouth 3 (three) times daily as needed for muscle spasms. 270 tablet 0  . ezetimibe (ZETIA) 10 MG tablet Take 1 tablet (10 mg total) daily by mouth. 90 tablet 3  . fluticasone (FLONASE) 50 MCG/ACT nasal spray Place 1 spray into both nostrils 2 (two) times daily. 48 g 1  . furosemide (LASIX) 20 MG tablet Take 1 tablet (20 mg total) by mouth as needed (swelling). 90 tablet 0  . gabapentin (NEURONTIN) 300 MG capsule 1 tab by mouth in AM, 1 po  at midday, then 2 by mouth at bedtime for sleep and pain 120 capsule 5  . guaiFENesin (MUCINEX) 600 MG 12 hr tablet Take 1,200 mg by mouth 2 (two) times daily as needed for cough or to loosen phlegm.    Marland Kitchen ipratropium (ATROVENT) 0.03 % nasal spray Place 2 sprays into both nostrils every 6 (six) hours as needed for rhinitis. 30 mL 12  . levothyroxine (SYNTHROID, LEVOTHROID) 25 MCG tablet Take 1 tablet (25 mcg total) by mouth daily before breakfast. 30 tablet 0  . Lidocaine HCl 2 % CREA Apply 1 application topically daily as needed (for shingles flares).    . LORazepam (ATIVAN) 0.5 MG tablet 1-2 tab by mouth at bedtime as needed for sleep 60 tablet 5  . Melatonin 5 MG TABS Take 5 mg by mouth at bedtime.    . mirabegron ER (MYRBETRIQ) 50 MG TB24 tablet Take 50 mg by mouth daily.    . montelukast (SINGULAIR) 10 MG tablet Take 1 tablet (10 mg total) daily by mouth. 90 tablet 1  . Multiple Vitamin (MULTIVITAMIN) tablet Take 1 tablet by mouth daily.      Marland Kitchen nystatin (MYCOSTATIN) 100000 UNIT/ML  suspension Take 5 cc by mouth, swish and swallow 4 times daily. 280 mL 0  . omeprazole (PRILOSEC) 40 MG capsule Take 1 capsule (40 mg total) by mouth daily. 90 capsule 3  . Probiotic Product (PROBIOTIC DAILY PO) Take by mouth.    . ranitidine (ZANTAC) 150 MG tablet Take 1 tablet (150 mg total) by mouth at bedtime. 90 tablet 1  . Respiratory Therapy Supplies (FLUTTER) DEVI Use 2-3 times per day 1 each 0  . Spacer/Aero-Holding Chambers (AEROCHAMBER Z-STAT PLUS) inhaler Use as instructed 1 each 0  . Tiotropium Bromide Monohydrate (SPIRIVA RESPIMAT) 2.5 MCG/ACT AERS Inhale 2 puffs into the lungs daily. 3 Inhaler 3  . metoprolol succinate (TOPROL-XL) 100 MG 24 hr tablet Take 1 tablet (100 mg total) by mouth daily. Take with or immediately following a meal. 90 tablet 3   No current facility-administered medications for this visit.     Allergies:   Clarithromycin; Lipitor [atorvastatin]; Simvastatin; Cardizem [diltiazem hcl]; Contrast media [iodinated diagnostic agents]; and Crestor [rosuvastatin calcium]   Social History:  The patient  reports that she quit smoking about 22 years ago. Her smoking use included cigarettes. She started smoking about 63 years ago. She has a 126.00 pack-year smoking history. she has never used smokeless tobacco. She reports that she does not drink alcohol or use drugs.   Family History:  The patient's family history includes Breast cancer in her maternal aunt, maternal aunt, maternal aunt, and mother; COPD in her mother; Emphysema in her mother; Uterine cancer in her mother.    ROS:  Please see the history of present illness.   Otherwise, review of systems is positive for none.   All other systems are reviewed and negative.   PHYSICAL EXAM: VS:  BP 116/62   Pulse 71   Ht 5\' 4"  (1.626 m)   Wt 143 lb (64.9 kg)   BMI 24.55 kg/m  , BMI Body mass index is 24.55 kg/m. GEN: Well nourished, well developed, in no acute distress  HEENT: normal  Neck: no JVD, carotid  bruits, or masses Cardiac: RRR; no murmurs, rubs, or gallops,no edema  Respiratory:  clear to auscultation bilaterally, normal work of breathing GI: soft, nontender, nondistended, + BS MS: no deformity or atrophy  Skin: warm and dry Neuro:  Strength and sensation are  intact Psych: euthymic mood, full affect  EKG:  EKG is ordered today. Personal review of the ekg ordered shows sinus rhythm, PVCs, septal Q waves   Recent Labs: 10/13/2017: ALT 15; BUN 41; Creatinine, Ser 1.23; Hemoglobin 12.2; Platelets 201.0; Potassium 3.8; Sodium 141; TSH 1.72    Lipid Panel     Component Value Date/Time   CHOL 225 (H) 10/13/2017 1431   TRIG 283.0 (H) 10/13/2017 1431   HDL 59.90 10/13/2017 1431   CHOLHDL 4 10/13/2017 1431   VLDL 56.6 (H) 10/13/2017 1431   LDLCALC 165 (H) 11/12/2015 1142   LDLDIRECT 133.0 10/13/2017 1431     Wt Readings from Last 3 Encounters:  12/14/17 143 lb (64.9 kg)  11/21/17 145 lb (65.8 kg)  11/16/17 143 lb (64.9 kg)      Other studies Reviewed: Additional studies/ records that were reviewed today include: TTE 04/05/17, Cath 08/22/16, Telemetry 06/14/16  Review of the above records today demonstrates:  TTE - Left ventricle: The cavity size was normal. Wall thickness was   normal. Systolic function was mildly reduced. The estimated   ejection fraction was in the range of 45% to 50%. Diffuse   hypokinesis. Doppler parameters are consistent with abnormal left   ventricular relaxation (grade 1 diastolic dysfunction). Doppler   parameters are consistent with high ventricular filling pressure. - Aortic valve: There was mild stenosis. - Mitral valve: There was moderate regurgitation. - Left atrium: The atrium was mildly dilated. - Pulmonary arteries: Systolic pressure was mildly increased. PA   peak pressure: 40 mm Hg (S).   Cath  Prox LAD to Mid LAD lesion, 15 %stenosed.  Ost Cx to Prox Cx lesion, 15 %stenosed.  Prox RCA to Mid RCA lesion, 20 %stenosed.  There  is mild left ventricular systolic dysfunction.  LV end diastolic pressure is normal.  The left ventricular ejection fraction is 45-50% by visual estimate.   1. Minor nonobstructive CAD 2. Mild LV dysfunction with distal inferior HK 3. Measurement of LV pressures limited by frequent PVCs but EDP appears normal.   Telemetry 1. NSR with PVC's, at times in a bigeminal manner 2. Signal drop out as well as noise artifact are present.  3. No sustained atrial or ventricular arrhythmias 4. No pauses   ASSESSMENT AND PLAN:  1.  Paroxysmal atrial fibrillation: On Eliquis and Toprol-XL.  Was previously taken off of amiodarone but is remaining in sinus rhythm.  No further changes.  This patients CHA2DS2-VASc Score and unadjusted Ischemic Stroke Rate (% per year) is equal to 7.2 % stroke rate/year from a score of 5  Above score calculated as 1 point each if present [CHF, HTN, DM, Vascular=MI/PAD/Aortic Plaque, Age if 65-74, or Female] Above score calculated as 2 points each if present [Age > 75, or Stroke/TIA/TE]  2. Chronic combined systolic HF: Currently on Toprol-XL.  No signs of volume overload.  Not currently fatigue.  No changes.  3. HTN: Well-controlled today.  No changes.  4. PVCs: Low burden likely not the cause of her cardiomyopathy.  No changes at this time.   Current medicines are reviewed at length with the patient today.   The patient does not have concerns regarding her medicines.  The following changes were made today:  none  Labs/ tests ordered today include:  Orders Placed This Encounter  Procedures  . EKG 12-Lead     Disposition:   FU with [name] 6 months  Signed, Kyuss Hale Meredith Leeds, MD  12/14/2017 12:13 PM  [address] [place] [address] [phone] (office) [phone] (fax)

## 2017-12-14 NOTE — Patient Instructions (Signed)
Medication Instructions: Your physician recommends that you continue on your current medications as directed. Please refer to the Current Medication list given to you today.   Labwork: None ordered  Procedures/Testing: None oreded   Follow-Up: Your physician wants you to follow-up in: 6 months with Dr. Curt Bears.  You will receive a reminder letter in the mail two months in advance. If you don't receive a letter, please call our office to schedule the follow-up appointment.  Any Additional Special Instructions Will Be Listed Below (If Applicable).     If you need a refill on your cardiac medications before your next appointment, please call your pharmacy.

## 2017-12-15 ENCOUNTER — Ambulatory Visit (INDEPENDENT_AMBULATORY_CARE_PROVIDER_SITE_OTHER): Payer: Medicare Other | Admitting: Sports Medicine

## 2017-12-15 ENCOUNTER — Encounter: Payer: Self-pay | Admitting: Pulmonary Disease

## 2017-12-15 ENCOUNTER — Telehealth: Payer: Self-pay

## 2017-12-15 ENCOUNTER — Encounter: Payer: Self-pay | Admitting: Sports Medicine

## 2017-12-15 VITALS — BP 124/74 | HR 77 | Ht 64.0 in | Wt 146.4 lb

## 2017-12-15 DIAGNOSIS — R269 Unspecified abnormalities of gait and mobility: Secondary | ICD-10-CM

## 2017-12-15 DIAGNOSIS — M84373A Stress fracture, unspecified ankle, initial encounter for fracture: Secondary | ICD-10-CM | POA: Diagnosis not present

## 2017-12-15 DIAGNOSIS — M76829 Posterior tibial tendinitis, unspecified leg: Secondary | ICD-10-CM

## 2017-12-15 DIAGNOSIS — R29898 Other symptoms and signs involving the musculoskeletal system: Secondary | ICD-10-CM | POA: Diagnosis not present

## 2017-12-15 DIAGNOSIS — M25871 Other specified joint disorders, right ankle and foot: Secondary | ICD-10-CM | POA: Diagnosis not present

## 2017-12-15 MED ORDER — AZELASTINE HCL 0.1 % NA SOLN: NASAL | 1 refills | Status: DC

## 2017-12-15 NOTE — Assessment & Plan Note (Signed)
Continue with the Aircast as tolerated at this time since she is doing so well with this.  We will have her begin therapeutic exercises with physical therapy and increase her weightbearing to as tolerated.  If any persistent ongoing symptoms may need to consider change to posterior tibialis brace given the good improvement with her current plan will continue with Aircast.  Intrinsic ankle strengthening recommended.  Begin with nonweightbearing ankle exercises and progress to weightbearing as tolerated.

## 2017-12-15 NOTE — Progress Notes (Signed)
[name], [facility] [place]  [name] - 77 y.o. female MRN [id number]  Date of birth: 12/24/1940  Visit Date: 12/15/2017  PCP: Biagio Borg, MD   Referred by: Biagio Borg, MD   Scribe for today's visit: Josepha Pigg, CMA     SUBJECTIVE:  [name] is here for Follow-up (RT hip pain and weakness)  Compared to the last office visit, her previously described symptoms are improving, she feels like she is getting stronger.  Current symptoms are moderate & are nonradiating She has been wearing her AirCast at all times unless she is in bed. She has also been wearing her Body Helix compression sleeve and feels that this has been beneficial. She has orthotic inserts in shoes and she is pleased with them.  She still has "twinge" in ankle when getting up at night to go to the bathroom and she has nothing on her feet.  Pt is ambulating with a cane.   ROS Denies night time disturbances. Denies fevers, chills, or night sweats. Denies unexplained weight loss. Reports personal history of cancer, basal cell carcinoma. Denies changes in bowel or bladder habits. Denies recent unreported falls. Denies new or worsening dyspnea or wheezing. Denies headaches or dizziness.  Denies numbness, tingling or weakness  In the extremities.  Denies dizziness or presyncopal episodes Reports lower extremity edema, RT ankle.    HISTORY & PERTINENT PRIOR DATA:  Prior History reviewed and updated per electronic medical record.  Significant history, findings, studies and interim changes include:  reports that she quit smoking about 22 years ago. Her smoking use included cigarettes. She started smoking about 63 years ago. She has a 126.00 pack-year smoking history. she has never used smokeless tobacco. Recent Labs    04/26/17 0801 10/13/17 1431  HGBA1C 6.3 5.8   No specialty comments available. Problem  Weakness of Right  Hip  Stress Fracture of Ankle, Initial Encounter   11/09/2017: MRI right ankle: Medial malleolus bony edema consistent with stress fracture with underlying posterior tibialis tendinosis with a small partial-thickness tear and mild tenosynovitis   Posterior Tibial Tendon Dysfunction    OBJECTIVE:  VS:  HT:5\' 4"  (162.6 cm)   WT:146 lb 6.4 oz (66.4 kg)  BMI:25.12    BP:124/74  HR:77bpm  TEMP: ( )  RESP:95 %   PHYSICAL EXAM: Constitutional: WDWN, Non-toxic appearing. Psychiatric: Alert & appropriately interactive. Not depressed or anxious appearing. Respiratory: No increased work of breathing. Trachea Midline Eyes: Pupils are equal. EOM intact without nystagmus. No scleral icterus Cardiovascular:  Peripheral Pulses: peripheral pulses symmetrical No clubbing or cyanosis appreciated Capillary Refill is normal, less than 2 seconds No signficant generalized edema/anasarca Sensory Exam: intact to light touch   Right ankle is overall well aligned with a high cavus foot.  She has a small amount of pain along the posterior tibialis tendons but this is minimal.  Mild pain along the anterior medial malleolus.  Ankle drawer testing is stable.  Antalgic gait  No additional findings.   ASSESSMENT & PLAN:   1. Weakness of right hip   2. Gait disturbance   3. Stress fracture of ankle, initial encounter   4. Posterior tibial tendon dysfunction   5. Ankle impingement syndrome, right    PLAN:    Weakness of right hip Improving with focus physical therapy.  We will have her continue with this and add additional ankle exercises and weightbearing exercises at  therapy given the improvement in her ankle.  Stress fracture of ankle, initial encounter Continue with the Aircast as tolerated at this time since she is doing so well with this.  We will have her begin therapeutic exercises with physical therapy and increase her weightbearing to as tolerated.  If any persistent ongoing symptoms may need to  consider change to posterior tibialis brace given the good improvement with her current plan will continue with Aircast.  Intrinsic ankle strengthening recommended.  Begin with nonweightbearing ankle exercises and progress to weightbearing as tolerated.  Posterior tibial tendon dysfunction Minimal pain over the posterior tibialis tendon.  Still small amount of pain over the medial malleolus anterior aspect that is mild in character and significantly improved.   ++++++++++++++++++++++++++++++++++++++++++++ Orders & Meds: No orders of the defined types were placed in this encounter.   No orders of the defined types were placed in this encounter.   ++++++++++++++++++++++++++++++++++++++++++++ Follow-up: Return in about 6 weeks (around 01/26/2018).   Pertinent documentation may be included in additional procedure notes, imaging studies, problem based documentation and patient instructions. Please see these sections of the encounter for additional information regarding this visit. CMA/ATC served as Education administrator during this visit. History, Physical, and Plan performed by medical provider. Documentation and orders reviewed and attested to.      Gerda Diss, [facility] Physician

## 2017-12-15 NOTE — Assessment & Plan Note (Signed)
Improving with focus physical therapy.  We will have her continue with this and add additional ankle exercises and weightbearing exercises at therapy given the improvement in her ankle.

## 2017-12-15 NOTE — Telephone Encounter (Signed)
Rx refill for astelin sent to [facility].

## 2017-12-15 NOTE — Assessment & Plan Note (Signed)
Minimal pain over the posterior tibialis tendon.  Still small amount of pain over the medial malleolus anterior aspect that is mild in character and significantly improved.

## 2017-12-19 DIAGNOSIS — R29898 Other symptoms and signs involving the musculoskeletal system: Secondary | ICD-10-CM | POA: Diagnosis not present

## 2017-12-19 DIAGNOSIS — M84373S Stress fracture, unspecified ankle, sequela: Secondary | ICD-10-CM | POA: Diagnosis not present

## 2017-12-19 DIAGNOSIS — M76829 Posterior tibial tendinitis, unspecified leg: Secondary | ICD-10-CM | POA: Diagnosis not present

## 2017-12-19 DIAGNOSIS — R269 Unspecified abnormalities of gait and mobility: Secondary | ICD-10-CM | POA: Diagnosis not present

## 2017-12-20 ENCOUNTER — Ambulatory Visit (INDEPENDENT_AMBULATORY_CARE_PROVIDER_SITE_OTHER): Payer: Medicare Other | Admitting: Pulmonary Disease

## 2017-12-20 ENCOUNTER — Ambulatory Visit (INDEPENDENT_AMBULATORY_CARE_PROVIDER_SITE_OTHER)
Admission: RE | Admit: 2017-12-20 | Discharge: 2017-12-20 | Disposition: A | Discharge status: Home / Self Care | Payer: Medicare Other | Source: Ambulatory Visit | Attending: Pulmonary Disease | Admitting: Pulmonary Disease

## 2017-12-20 ENCOUNTER — Encounter: Payer: Self-pay | Admitting: Pulmonary Disease

## 2017-12-20 VITALS — BP 110/64 | HR 74 | Ht 64.0 in | Wt 146.8 lb

## 2017-12-20 DIAGNOSIS — K219 Gastro-esophageal reflux disease without esophagitis: Secondary | ICD-10-CM

## 2017-12-20 DIAGNOSIS — R490 Dysphonia: Secondary | ICD-10-CM | POA: Diagnosis not present

## 2017-12-20 DIAGNOSIS — J449 Chronic obstructive pulmonary disease, unspecified: Secondary | ICD-10-CM | POA: Diagnosis not present

## 2017-12-20 DIAGNOSIS — J309 Allergic rhinitis, unspecified: Secondary | ICD-10-CM

## 2017-12-20 DIAGNOSIS — R059 Cough, unspecified: Secondary | ICD-10-CM

## 2017-12-20 DIAGNOSIS — R05 Cough: Secondary | ICD-10-CM

## 2017-12-20 MED ORDER — FLUTICASONE-UMECLIDIN-VILANT 100-62.5-25 MCG/INH IN AEPB: 1.0000 | INHALATION_SPRAY | Freq: Every day | RESPIRATORY_TRACT | 0 refills | Status: DC

## 2017-12-20 MED ORDER — IPRATROPIUM BROMIDE 0.03 % NA SOLN: 2.0000 | Freq: Four times a day (QID) | NASAL | 12 refills | Status: DC | PRN

## 2017-12-20 NOTE — Progress Notes (Signed)
Subjective:   PATIENT ID: [name]: female DOB: 1941/03/27, MRN: [id number]  Synopsis: Referred in 2017, followed by Dr. Ashok Cordia previously for COPD, allergic rhinitis, GERD  HPI  Chief Complaint  Patient presents with  . Follow-up    Pt states she has been doing good since last visit. Denies any complaints of cough, SOB, or CP. Only complaint pt has is that she is still hoarse.   Last visit had upper airway cough syndrome, started on ipratropium nasal spray.  Had serum IgE checked which was normal.  She still has hoarseness: this has been persistent.  She saw an ENT physician who said that it was from GERD.  She changed the way she takes the antiacids differently.  She says she can't sing anymore.  The hoarseness hasn't improved.    She does have a slight intermittent dry cough.   Past Medical History:  Diagnosis Date  . Allergic rhinitis   . Anxiety   . Atrial septal aneurysm   . Basal cell carcinoma   . Bradycardia   . Cataract    bil cataracts removed  . Chronic combined systolic and diastolic CHF (congestive heart failure) ([place])   . CKD (chronic kidney disease), stage III ([place])   . Clostridium difficile infection   . COPD (chronic obstructive pulmonary disease) ([place])   . Depression 11/29/2013  . DI (detrusor instability)   . Esophageal stricture   . GERD (gastroesophageal reflux disease)   . Hemorrhoids   . Hiatal hernia   . History of kidney stones   . HLD (hyperlipidemia)   . HTN (hypertension)   . Ischemic colon ([place])    a. 08/2016 - adm to  [facility] with a ruptured colon s/p right hemicolectomy with ileostomy formation for ischemic and necrotic colon.   . Lung nodule    a. followed by pulmonology  . Mild CAD    a. minor nonobstructive CAD 08/22/16.  . Nephrolithiasis    hx  . NICM (nonischemic cardiomyopathy) ([place])   . Orthostatic hypotension   . Osteoarthritis   . PAF (paroxysmal atrial fibrillation) ([place])    a. diagnosed 07/2016  s/p DCCV 08/01/16 with recurrence, managed with rate control for now.   . Pneumonia 2013  . PVC's (premature ventricular contractions)   . Shingles since Nov 18, 2013   on right arm from shoulder to wrist  . Status post dilation of esophageal narrowing   . Stroke [facility]) 2001   STROKES, TIA'S  . Syncope    a. 08/2016 felt due to orthostatic hypotension.  Marland Kitchen TIA (transient ischemic attack) last 2001   anticoagulation therapy on coumadin        Review of Systems  Constitutional: Negative for diaphoresis, fever and malaise/fatigue.  HENT: Negative for congestion, nosebleeds and sinus pain.   Respiratory: Positive for cough. Negative for sputum production.       Objective:  Physical Exam   Vitals:   12/20/17 1020  BP: 110/64  Pulse: 74  SpO2: 95%  Weight: 146 lb 12.8 oz (66.6 kg)  Height: 5\' 4"  (1.626 m)   RA   Gen: well appearing HENT: OP clear, TM's clear, neck supple PULM: CTA B, normal percussion CV: RRR, no mgr, trace edema GI: BS+, soft, nontender Derm: no cyanosis or rash Psyche: normal mood and affect    CBC    Component Value Date/Time   WBC 5.6 10/13/2017 1431   RBC 5.41 (H) 10/13/2017 1431   HGB 12.2  10/13/2017 1431   HCT 38.5 10/13/2017 1431   PLT 201.0 10/13/2017 1431   MCV 71.1 (L) 10/13/2017 1431   MCH 24.8 (L) 09/16/2016 1648   MCHC 31.6 10/13/2017 1431   RDW 18.4 (H) 10/13/2017 1431   LYMPHSABS 1.7 10/13/2017 1431   MONOABS 0.8 10/13/2017 1431   EOSABS 0.1 10/13/2017 1431   BASOSABS 0.0 10/13/2017 1431     PFT 10/10/16: FVC 1.94 L (70%) FEV1 0.93 L (45%) FEV1/FVC 0.48 FEF 25-75 0.35 L (22%) positive bronchodilator response 03/01/16: FVC 2.55 L (91%) FEV1 1.30 L (62%) FEV1/FVC 0.51 FEF 25-75 0.56 L (34%) negative bronchodilator response TLC 5.66 L (111%) RV 116% ERV 133% DLCO uncorrected 62% (hemoglobin 15.1) 12/28/11:FVC 2.89 L (104%) FEV1 1.22 L (62%) FEV1/FVC 0.42 FEF 25-75 0.33 L (15%) negative bronchodilator response TLC 6.19 L  (129%) RV 163% ERV 114% DLCO uncorrected 69%  6MWT 10/10/16:  Walked 96 meters / Baseline Sat 93% on RA / Nadir Sat 93% on RA @ rest (couldn't finish due to weakness & dyspnea  W/ 2:57 left) 08/24/16:  Walked 1 lap & was 96% at lowest on room air  IMAGING CXR PA/LAT 06/19/17 : Images independently reviewed by me today, showing emphysema, no other pulmonary parenchymal abnormality.    CXR PA/LAT 05/18/17:  Patchy opacification in right middle and lower lobes. No pleural effusion appreciated. Heart normal in size & mediastinum normal in contour.  CT CHEST W/O 01/23/17: Resolution of groundglass opacity. 7 mm right lower lobe nodule unchanged. Apical predominant emphysematous changes. Right middle lobe nodule was not appreciated on this image. No pleural effusion or thickening. No pathologic mediastinal adenopathy. No pericardial effusion.  CTA CHEST 08/17/16:  No evidence of PE. No pleural effusion or thickening. No pathologic mediastinal adenopathy. Groundglass noted within right lung predominantly in the upper lung zone. Moderate upper lobe predominant emphysema which has progressed since 2015. 7 mm right lower lobe nodule has not changed since 2015. Patient has a 4 mm nodule within right middle lobe which appears new.  CXR PA/LAT 05/25/16: Hyperinflation with flattening of the diaphragms. No parenchymal opacity or mass appreciated. No pleural effusion. Heart normal in size & mediastinum normal in contour.  CXR PA/LAT 03/22/16:  Hyperinflation with elevation of left hemidiaphragm. No new focal opacity. Heart normal in size & mediastinum normal in contour.  CXR PA/LAT 11/09/15: No focal opacity or effusion appreciated. Heart normal in size. Mediastinum normal in contour. Mild hyperinflation with flattening of the diaphragms.   CARDIAC TTE (06/24/16): LVEF 45-50% with grade 2 diastolic dysfunction. LA moderately to severely dilated. RA normal in size. RV normal in size and function. No aortic stenosis  or regurgitation. Moderate centrally directed mitral regurgitation. Paradoxical ventricular septal motion consistent with intraventricular conduction delay. No pulmonic regurgitation. No tricuspid regurgitation. No pericardial effusion.  MICROBIOLOGY Sputum Culture (07/05/17):  Candida Species / AFB negative / Moderate Squamous Epithelials   LABS 12/22/15 Alpha-1 antitrypsin: MM (143) 11/2017 IgE 9 (normal)      Assessment & Plan:   Cough - Plan: DG Chest 2 View  COPD, moderate (HCC)  Allergic rhinitis, unspecified seasonality, unspecified trigger  Gastroesophageal reflux disease, esophagitis presence not specified  Quality of voice, hoarse  Discussion: Ms. Wescott presents with persistent hoarseness despite a change in treatment for her gastroesophageal reflux disease and an absence of postnasal drip.  She is quite anxious and is worried that she may have lung cancer.  I reassured her that all of her recent images of the pulmonary  nodule have been very distal to her recurrent laryngeal nerve and are unlikely to cause this.  In regards to the hoarseness: ENT felt that it was related to acid reflux.  After changing medicines this did not make much of a difference.  Sometimes inhaled medicines can cause hoarseness.  We will have her switch off of Symbicort and Spiriva for a brief time and use Trelegy to see if this makes a difference.  However, if it does not make a difference then I think she should go back to gastroenterology to discuss control of gastroesophageal reflux disease.  Plan: GERD: Continue taking the omeprazole and Zantac If no improvement in hoarseness then go back to see gastroenterology  COPD: Try taking Trelegy 1 puff daily, while taking this medication do not take Symbicort or Spiriva.  If you find that your hoarseness improves on Trelegy then call us and we can change her prescription.  Otherwise if no changes noted then go back to taking Symbicort and Spiriva Keep  using albuterol as needed Practice good hand hygiene this time a year  Hoarseness: As above We will check a Chest X-ray today  Vasomotor rhinitis: Continue taking ipratropium nose spray as directed Continue antihistamine as directed  I will see you back in 6 months or sooner if needed   Current Outpatient Medications:  .  albuterol (PROVENTIL HFA;VENTOLIN HFA) 108 (90 Base) MCG/ACT inhaler, Inhale 1-2 puffs into the lungs every 6 (six) hours as needed for wheezing or shortness of breath., Disp: 3 Inhaler, Rfl: 4 .  apixaban (ELIQUIS) 5 MG TABS tablet, Take 1 tablet (5 mg total) by mouth 2 (two) times daily., Disp: 180 tablet, Rfl: 2 .  azelastine (ASTELIN) 0.1 % nasal spray, Place 2 sprays into the nose daily. Use in each nostril as directed, Disp: 90 mL, Rfl: 1 .  benzonatate (TESSALON PERLES) 100 MG capsule, Take 1 capsule (100 mg total) 3 (three) times daily as needed by mouth for cough., Disp: 270 capsule, Rfl: 0 .  budesonide-formoterol (SYMBICORT) 160-4.5 MCG/ACT inhaler, Inhale 2 puffs into the lungs 2 (two) times daily., Disp: 3 Inhaler, Rfl: 4 .  buPROPion (WELLBUTRIN XL) 300 MG 24 hr tablet, Take 1 tablet (300 mg total) by mouth daily., Disp: 30 tablet, Rfl: 0 .  Calcium Carbonate-Vitamin D (CALCIUM + D PO), Take 1 tablet by mouth 2 (two) times daily. , Disp: , Rfl:  .  ciclopirox (PENLAC) 8 % solution, Apply at bedtime topically. Apply over nail and surrounding skin. Apply daily over previous coat. After seven (7) days, may remove with alcohol and continue cycle., Disp: 6.6 mL, Rfl: 5 .  cyclobenzaprine (FLEXERIL) 5 MG tablet, Take 1 tablet (5 mg total) by mouth 3 (three) times daily as needed for muscle spasms., Disp: 270 tablet, Rfl: 0 .  ezetimibe (ZETIA) 10 MG tablet, Take 1 tablet (10 mg total) daily by mouth., Disp: 90 tablet, Rfl: 3 .  fluticasone (FLONASE) 50 MCG/ACT nasal spray, Place 1 spray into both nostrils 2 (two) times daily., Disp: 48 g, Rfl: 1 .  furosemide  (LASIX) 20 MG tablet, Take 1 tablet (20 mg total) by mouth as needed (swelling)., Disp: 90 tablet, Rfl: 0 .  gabapentin (NEURONTIN) 300 MG capsule, 1 tab by mouth in AM, 1 po at midday, then 2 by mouth at bedtime for sleep and pain, Disp: 120 capsule, Rfl: 5 .  guaiFENesin (MUCINEX) 600 MG 12 hr tablet, Take 1,200 mg by mouth 2 (two) times daily as needed for  cough or to loosen phlegm., Disp: , Rfl:  .  ipratropium (ATROVENT) 0.03 % nasal spray, Place 2 sprays into both nostrils every 6 (six) hours as needed for rhinitis., Disp: 30 mL, Rfl: 12 .  levothyroxine (SYNTHROID, LEVOTHROID) 25 MCG tablet, Take 1 tablet (25 mcg total) by mouth daily before breakfast., Disp: 30 tablet, Rfl: 0 .  Lidocaine HCl 2 % CREA, Apply 1 application topically daily as needed (for shingles flares)., Disp: , Rfl:  .  LORazepam (ATIVAN) 0.5 MG tablet, 1-2 tab by mouth at bedtime as needed for sleep, Disp: 60 tablet, Rfl: 5 .  Melatonin 5 MG TABS, Take 5 mg by mouth at bedtime., Disp: , Rfl:  .  metoprolol succinate (TOPROL-XL) 100 MG 24 hr tablet, Take 100 mg by mouth daily. Take with or immediately following a meal., Disp: , Rfl:  .  mirabegron ER (MYRBETRIQ) 50 MG TB24 tablet, Take 50 mg by mouth daily., Disp: , Rfl:  .  montelukast (SINGULAIR) 10 MG tablet, Take 1 tablet (10 mg total) daily by mouth., Disp: 90 tablet, Rfl: 1 .  Multiple Vitamin (MULTIVITAMIN) tablet, Take 1 tablet by mouth daily.  , Disp: , Rfl:  .  nystatin (MYCOSTATIN) 100000 UNIT/ML suspension, Take 5 cc by mouth, swish and swallow 4 times daily., Disp: 280 mL, Rfl: 0 .  omeprazole (PRILOSEC) 40 MG capsule, Take 1 capsule (40 mg total) by mouth daily., Disp: 90 capsule, Rfl: 3 .  Probiotic Product (PROBIOTIC DAILY PO), Take by mouth., Disp: , Rfl:  .  ranitidine (ZANTAC) 150 MG tablet, Take 1 tablet (150 mg total) by mouth at bedtime., Disp: 90 tablet, Rfl: 1 .  Spacer/Aero-Holding Chambers (AEROCHAMBER Z-STAT PLUS) inhaler, Use as instructed, Disp:  1 each, Rfl: 0 .  Tiotropium Bromide Monohydrate (SPIRIVA RESPIMAT) 2.5 MCG/ACT AERS, Inhale 2 puffs into the lungs daily., Disp: 3 Inhaler, Rfl: 3 .  TURMERIC PO, Take 2 tablets by mouth daily., Disp: , Rfl:  .  Fluticasone-Umeclidin-Vilant (TRELEGY ELLIPTA) 100-62.5-25 MCG/INH AEPB, Inhale 1 puff into the lungs daily., Disp: 1 each, Rfl: 0 .  metoprolol succinate (TOPROL-XL) 100 MG 24 hr tablet, Take 1 tablet (100 mg total) by mouth daily. Take with or immediately following a meal., Disp: 90 tablet, Rfl: 3 .  Respiratory Therapy Supplies (FLUTTER) DEVI, Use 2-3 times per day (Patient not taking: Reported on 12/20/2017), Disp: 1 each, Rfl: 0

## 2017-12-20 NOTE — Patient Instructions (Signed)
GERD: Continue taking the omeprazole and Zantac If no improvement in hoarseness then go back to see gastroenterology  COPD: Try taking Trelegy 1 puff daily, while taking this medication do not take Symbicort or Spiriva.  If you find that your hoarseness improves on Trelegy then call us and we can change her prescription.  Otherwise if no changes noted then go back to taking Symbicort and Spiriva Keep using albuterol as needed Practice good hand hygiene this time a year  Hoarseness: As above We will check a Chest X-ray today  Vasomotor rhinitis: Continue taking ipratropium nose spray as directed Continue antihistamine as directed  I will see you back in 6 months or sooner if needed

## 2017-12-21 DIAGNOSIS — R29898 Other symptoms and signs involving the musculoskeletal system: Secondary | ICD-10-CM | POA: Diagnosis not present

## 2017-12-21 DIAGNOSIS — M84373S Stress fracture, unspecified ankle, sequela: Secondary | ICD-10-CM | POA: Diagnosis not present

## 2017-12-21 DIAGNOSIS — M76829 Posterior tibial tendinitis, unspecified leg: Secondary | ICD-10-CM | POA: Diagnosis not present

## 2017-12-21 DIAGNOSIS — R269 Unspecified abnormalities of gait and mobility: Secondary | ICD-10-CM | POA: Diagnosis not present

## 2017-12-26 DIAGNOSIS — R29898 Other symptoms and signs involving the musculoskeletal system: Secondary | ICD-10-CM | POA: Diagnosis not present

## 2017-12-26 DIAGNOSIS — R269 Unspecified abnormalities of gait and mobility: Secondary | ICD-10-CM | POA: Diagnosis not present

## 2017-12-26 DIAGNOSIS — M84373S Stress fracture, unspecified ankle, sequela: Secondary | ICD-10-CM | POA: Diagnosis not present

## 2017-12-26 DIAGNOSIS — M76829 Posterior tibial tendinitis, unspecified leg: Secondary | ICD-10-CM | POA: Diagnosis not present

## 2017-12-28 ENCOUNTER — Encounter: Payer: Self-pay | Admitting: Pulmonary Disease

## 2017-12-28 DIAGNOSIS — M84373S Stress fracture, unspecified ankle, sequela: Secondary | ICD-10-CM | POA: Diagnosis not present

## 2017-12-28 DIAGNOSIS — M76829 Posterior tibial tendinitis, unspecified leg: Secondary | ICD-10-CM | POA: Diagnosis not present

## 2017-12-28 DIAGNOSIS — R269 Unspecified abnormalities of gait and mobility: Secondary | ICD-10-CM | POA: Diagnosis not present

## 2017-12-28 DIAGNOSIS — R29898 Other symptoms and signs involving the musculoskeletal system: Secondary | ICD-10-CM | POA: Diagnosis not present

## 2017-12-28 MED ORDER — FLUTICASONE-UMECLIDIN-VILANT 100-62.5-25 MCG/INH IN AEPB: 1.0000 | INHALATION_SPRAY | Freq: Every day | RESPIRATORY_TRACT | 3 refills | Status: DC

## 2017-12-28 NOTE — Telephone Encounter (Signed)
Trelegy rx sent to preferred pharmacy. Nothing further needed.

## 2018-01-02 ENCOUNTER — Other Ambulatory Visit: Payer: Self-pay

## 2018-01-02 DIAGNOSIS — M84373S Stress fracture, unspecified ankle, sequela: Secondary | ICD-10-CM | POA: Diagnosis not present

## 2018-01-02 DIAGNOSIS — M76829 Posterior tibial tendinitis, unspecified leg: Secondary | ICD-10-CM | POA: Diagnosis not present

## 2018-01-02 DIAGNOSIS — R269 Unspecified abnormalities of gait and mobility: Secondary | ICD-10-CM | POA: Diagnosis not present

## 2018-01-02 DIAGNOSIS — R29898 Other symptoms and signs involving the musculoskeletal system: Secondary | ICD-10-CM | POA: Diagnosis not present

## 2018-01-02 MED ORDER — IPRATROPIUM BROMIDE 0.03 % NA SOLN: 2.0000 | Freq: Three times a day (TID) | NASAL | 12 refills | Status: DC | PRN

## 2018-01-04 DIAGNOSIS — M84373S Stress fracture, unspecified ankle, sequela: Secondary | ICD-10-CM | POA: Diagnosis not present

## 2018-01-04 DIAGNOSIS — R269 Unspecified abnormalities of gait and mobility: Secondary | ICD-10-CM | POA: Diagnosis not present

## 2018-01-04 DIAGNOSIS — M76829 Posterior tibial tendinitis, unspecified leg: Secondary | ICD-10-CM | POA: Diagnosis not present

## 2018-01-04 DIAGNOSIS — R29898 Other symptoms and signs involving the musculoskeletal system: Secondary | ICD-10-CM | POA: Diagnosis not present

## 2018-01-07 ENCOUNTER — Encounter: Payer: Self-pay | Admitting: Pulmonary Disease

## 2018-01-08 DIAGNOSIS — R29898 Other symptoms and signs involving the musculoskeletal system: Secondary | ICD-10-CM | POA: Diagnosis not present

## 2018-01-08 DIAGNOSIS — R269 Unspecified abnormalities of gait and mobility: Secondary | ICD-10-CM | POA: Diagnosis not present

## 2018-01-08 DIAGNOSIS — M76829 Posterior tibial tendinitis, unspecified leg: Secondary | ICD-10-CM | POA: Diagnosis not present

## 2018-01-08 DIAGNOSIS — M84373S Stress fracture, unspecified ankle, sequela: Secondary | ICD-10-CM | POA: Diagnosis not present

## 2018-01-10 DIAGNOSIS — M84373S Stress fracture, unspecified ankle, sequela: Secondary | ICD-10-CM | POA: Diagnosis not present

## 2018-01-10 DIAGNOSIS — M76829 Posterior tibial tendinitis, unspecified leg: Secondary | ICD-10-CM | POA: Diagnosis not present

## 2018-01-10 DIAGNOSIS — R29898 Other symptoms and signs involving the musculoskeletal system: Secondary | ICD-10-CM | POA: Diagnosis not present

## 2018-01-10 DIAGNOSIS — R269 Unspecified abnormalities of gait and mobility: Secondary | ICD-10-CM | POA: Diagnosis not present

## 2018-01-16 DIAGNOSIS — R269 Unspecified abnormalities of gait and mobility: Secondary | ICD-10-CM | POA: Diagnosis not present

## 2018-01-16 DIAGNOSIS — R29898 Other symptoms and signs involving the musculoskeletal system: Secondary | ICD-10-CM | POA: Diagnosis not present

## 2018-01-16 DIAGNOSIS — M84373S Stress fracture, unspecified ankle, sequela: Secondary | ICD-10-CM | POA: Diagnosis not present

## 2018-01-16 DIAGNOSIS — M76829 Posterior tibial tendinitis, unspecified leg: Secondary | ICD-10-CM | POA: Diagnosis not present

## 2018-01-18 DIAGNOSIS — R269 Unspecified abnormalities of gait and mobility: Secondary | ICD-10-CM | POA: Diagnosis not present

## 2018-01-18 DIAGNOSIS — M76829 Posterior tibial tendinitis, unspecified leg: Secondary | ICD-10-CM | POA: Diagnosis not present

## 2018-01-18 DIAGNOSIS — M84373S Stress fracture, unspecified ankle, sequela: Secondary | ICD-10-CM | POA: Diagnosis not present

## 2018-01-18 DIAGNOSIS — R29898 Other symptoms and signs involving the musculoskeletal system: Secondary | ICD-10-CM | POA: Diagnosis not present

## 2018-01-19 DIAGNOSIS — N183 Chronic kidney disease, stage 3 (moderate): Secondary | ICD-10-CM | POA: Diagnosis not present

## 2018-01-23 DIAGNOSIS — R29898 Other symptoms and signs involving the musculoskeletal system: Secondary | ICD-10-CM | POA: Diagnosis not present

## 2018-01-23 DIAGNOSIS — R269 Unspecified abnormalities of gait and mobility: Secondary | ICD-10-CM | POA: Diagnosis not present

## 2018-01-23 DIAGNOSIS — M76829 Posterior tibial tendinitis, unspecified leg: Secondary | ICD-10-CM | POA: Diagnosis not present

## 2018-01-23 DIAGNOSIS — M84373S Stress fracture, unspecified ankle, sequela: Secondary | ICD-10-CM | POA: Diagnosis not present

## 2018-01-25 DIAGNOSIS — M84373S Stress fracture, unspecified ankle, sequela: Secondary | ICD-10-CM | POA: Diagnosis not present

## 2018-01-25 DIAGNOSIS — M76829 Posterior tibial tendinitis, unspecified leg: Secondary | ICD-10-CM | POA: Diagnosis not present

## 2018-01-25 DIAGNOSIS — R29898 Other symptoms and signs involving the musculoskeletal system: Secondary | ICD-10-CM | POA: Diagnosis not present

## 2018-01-25 DIAGNOSIS — R269 Unspecified abnormalities of gait and mobility: Secondary | ICD-10-CM | POA: Diagnosis not present

## 2018-01-26 ENCOUNTER — Encounter: Payer: Self-pay | Admitting: Sports Medicine

## 2018-01-26 ENCOUNTER — Ambulatory Visit (INDEPENDENT_AMBULATORY_CARE_PROVIDER_SITE_OTHER): Payer: Medicare Other | Admitting: Sports Medicine

## 2018-01-26 VITALS — BP 132/62 | HR 62 | Wt 149.0 lb

## 2018-01-26 DIAGNOSIS — R29898 Other symptoms and signs involving the musculoskeletal system: Secondary | ICD-10-CM

## 2018-01-26 DIAGNOSIS — M76829 Posterior tibial tendinitis, unspecified leg: Secondary | ICD-10-CM | POA: Diagnosis not present

## 2018-01-26 DIAGNOSIS — M84373A Stress fracture, unspecified ankle, initial encounter for fracture: Secondary | ICD-10-CM | POA: Diagnosis not present

## 2018-01-26 DIAGNOSIS — M25871 Other specified joint disorders, right ankle and foot: Secondary | ICD-10-CM | POA: Diagnosis not present

## 2018-01-26 DIAGNOSIS — M79604 Pain in right leg: Secondary | ICD-10-CM | POA: Diagnosis not present

## 2018-01-26 DIAGNOSIS — R269 Unspecified abnormalities of gait and mobility: Secondary | ICD-10-CM | POA: Diagnosis not present

## 2018-01-26 NOTE — Progress Notes (Signed)
[name]. [name], [place] [facility] [place]  [name] - 77 y.o. female MRN [id number]  Date of birth: 05-Nov-1941  Visit Date: 01/26/2018  PCP: Biagio Borg, MD   Referred by: Biagio Borg, MD   Scribe for today's visit: Lonell Grandchild, CMA      SUBJECTIVE:  [name] is here for Follow-up (right foot pain ) .  Referred by: Dr. Cathlean Cower  Compared to the last office visit, her previously described symptoms in hip are improving, she feels like she is getting stronger. She is still having pain in right ankle.   Current symptoms are moderate & are nonradiating She has been wearing her AirCast at all times unless she is in bed. She has also been wearing her Body Helix compression sleeve and feels that this has been beneficial. She has orthotic inserts in shoes and she is pleased with them.  She still has "twinge" in ankle when getting up at night to go to the bathroom and she has nothing on her feet.  Pt is ambulating with a cane.  Compared to her last office visit on 12/15/17, her previously described R hip weakness and R post tib tendon dysfunction are improving/worsening/no change. Current symptoms are mild/mod/severe and radiating/non-radiating. She has been using her Body Helix ankle compression sleeve, wearing her AirCast and using her custom orthotics.  She has been doing her HEP.   ROS Denies night time disturbances. Denies fevers, chills, or night sweats. Denies unexplained weight loss. Reports personal history of cancer. Basal Cell  Denies changes in bowel or bladder habits. Denies recent unreported falls. Denies new or worsening dyspnea or wheezing. Denies headaches or dizziness.  Denies numbness, tingling or weakness  In the extremities.  Denies dizziness or presyncopal episodes Reports lower extremity edema in right leg and foot.    HISTORY & PERTINENT PRIOR DATA:  Prior History reviewed and  updated per electronic medical record.  Significant history, findings, studies and interim changes include:  reports that she quit smoking about 22 years ago. Her smoking use included cigarettes. She started smoking about 63 years ago. She has a 126.00 pack-year smoking history. she has never used smokeless tobacco. Recent Labs    04/26/17 0801 10/13/17 1431  HGBA1C 6.3 5.8   No specialty comments available. No problems updated.  OBJECTIVE:  VS:  HT:    WT:149 lb (67.6 kg)  BMI:     BP:132/62  HR:62bpm  TEMP: ( )  RESP:95 %   PHYSICAL EXAM: Constitutional: WDWN, Non-toxic appearing. Psychiatric: Alert & appropriately interactive.  Not depressed or anxious appearing. Respiratory: No increased work of breathing.  Trachea Midline Eyes: Pupils are equal.  EOM intact without nystagmus.  No scleral icterus  NEUROVASCULAR exam: No clubbing or cyanosis appreciated No significant venous stasis changes Capillary Refill: normal, less than 2 seconds   Right leg: Hip abduction strength has improved although she continues to have a significantly antalgic gait with underlying hip weakness in abduction.  She has good internal and external rotation of the hip Her ankle continues to be slightly sore over the anterior medial ankle which was the area of edema on the MRI.  She has very little pain directly over the posterior tibialis tendon but has poor motor control and significant weakness with this.  Inversion and eversion strength is 3 out of 5.  Knee flexion and extension strength is intact.   ASSESSMENT & PLAN:  1. Right leg pain   2. Weakness of right hip   3. Gait disturbance   4. Stress fracture of ankle, initial encounter   5. Posterior tibial tendon dysfunction   6. Ankle impingement syndrome, right    ++++++++++++++++++++++++++++++++++++++++++++ Orders & Meds:  Orders Placed This Encounter  Procedures  . Ambulatory referral to Neurology   No orders of the defined types were  placed in this encounter.   ++++++++++++++++++++++++++++++++++++++++++++ PLAN:   Findings:  Given the amount of weakness and persistent ongoing pain that does not correlate with normal healing response I would like for her to be evaluated with nerve conduction study of the right lower extremity to evaluate for potential underlying neurogenic etiology.  She denies any significant back pain so radicular symptoms are likely less are less likely.  She does have underlying hip dysfunction which may also be related to potential underlying neurogenic etiology however difficult to say at this time and given no significant back pain mainly distal symptoms peripheral neuropathy towards the top of my differential diagnosis.  Until further diagnostic evaluation completed hold off on further physical therapy for the foot and ankle but okay to continue with hip strengthening as this has been beneficial for her.   Follow-up: Return for results of NCV and EMG.   Pertinent documentation may be included in additional procedure notes, imaging studies, problem based documentation and patient instructions. Please see these sections of the encounter for additional information regarding this visit. CMA/ATC served as Education administrator during this visit. History, Physical, and Plan performed by medical provider. Documentation and orders reviewed and attested to.      Gerda Diss, [facility] Physician

## 2018-01-29 ENCOUNTER — Encounter: Payer: Self-pay | Admitting: Neurology

## 2018-01-29 ENCOUNTER — Other Ambulatory Visit: Payer: Self-pay | Admitting: *Deleted

## 2018-01-29 ENCOUNTER — Other Ambulatory Visit: Payer: Self-pay | Admitting: Internal Medicine

## 2018-01-29 DIAGNOSIS — N183 Chronic kidney disease, stage 3 (moderate): Secondary | ICD-10-CM | POA: Diagnosis not present

## 2018-01-29 DIAGNOSIS — M79604 Pain in right leg: Secondary | ICD-10-CM

## 2018-01-29 DIAGNOSIS — Z1231 Encounter for screening mammogram for malignant neoplasm of breast: Secondary | ICD-10-CM

## 2018-01-30 DIAGNOSIS — R29898 Other symptoms and signs involving the musculoskeletal system: Secondary | ICD-10-CM | POA: Diagnosis not present

## 2018-01-30 DIAGNOSIS — M76829 Posterior tibial tendinitis, unspecified leg: Secondary | ICD-10-CM | POA: Diagnosis not present

## 2018-01-30 DIAGNOSIS — M84373S Stress fracture, unspecified ankle, sequela: Secondary | ICD-10-CM | POA: Diagnosis not present

## 2018-01-30 DIAGNOSIS — R269 Unspecified abnormalities of gait and mobility: Secondary | ICD-10-CM | POA: Diagnosis not present

## 2018-02-01 ENCOUNTER — Ambulatory Visit (INDEPENDENT_AMBULATORY_CARE_PROVIDER_SITE_OTHER): Payer: Medicare Other | Admitting: Gastroenterology

## 2018-02-01 ENCOUNTER — Encounter: Payer: Self-pay | Admitting: Gastroenterology

## 2018-02-01 VITALS — BP 120/70 | HR 70 | Ht 64.0 in | Wt 151.0 lb

## 2018-02-01 DIAGNOSIS — R197 Diarrhea, unspecified: Secondary | ICD-10-CM

## 2018-02-01 NOTE — Patient Instructions (Signed)
If you are age 79 or older, your body mass index should be between 23-30. Your Body mass index is 25.92 kg/m. If this is out of the aforementioned range listed, please consider follow up with your Primary Care Provider.  If you are age 65 or younger, your body mass index should be between 19-25. Your Body mass index is 25.92 kg/m. If this is out of the aformentioned range listed, please consider follow up with your Primary Care Provider.   Change Probiotic to Florastor.  Start IBGuard for gas/bloating -(over-the-counter).  Start Benefiber or Citrucel powder daily.  Imodium as needed.  Thank you for choosing me and [facility].   Tye Savoy, NP

## 2018-02-01 NOTE — Progress Notes (Signed)
02/01/2018 [place] [id number] [birth year]   HISTORY OF PRESENT ILLNESS:  This is a 77 year old female who is known to Dr. Henrene Pastor.  She underwent emergency surgery for right sided colon ischemia in September 2017 which required a right hemicolectomy and temporary ileostomy. She then had her ileostomy reversed on 11/21/2016. All these surgeries were performed at [facility]. While she was there for her ileostomy reversal she contracted C. difficile infection that was treated with flagyl and vancomycin.  When I saw her in 12/2016 she had been complaining of weight loss, nausea, and diarrhea.  Since that time she has been doing much better.  Weight is up by about 25 pounds.  She is still complaining of loose stools with about 3-4 BM's per day, usually after every time that she eats.  She says that usually the first BM of the day is normal but they progress to loose stool throughout the day.  No blood stool and no nocturnal BM's.  No abdominal pain.  Does report a lot of gas/bloating, however.  She does take culturelle probiotic daily.  Uses Imodium a couple of times per week.  Had tried [place] last year for a short time but at that time she felt that it worsened her gas and bloating, although she barely recalls taking it.      Past Medical History:  Diagnosis Date  . Allergic rhinitis   . Anxiety   . Atrial septal aneurysm   . Basal cell carcinoma   . Bradycardia   . Cataract    bil cataracts removed  . Chronic combined systolic and diastolic CHF (congestive heart failure) ([place])   . CKD (chronic kidney disease), stage III ([place])   . Clostridium difficile infection   . COPD (chronic obstructive pulmonary disease) ([place])   . Depression 11/29/2013  . DI (detrusor instability)   . Esophageal stricture   . GERD (gastroesophageal reflux disease)   . Hemorrhoids   . Hiatal hernia   . History of kidney stones   . HLD (hyperlipidemia)   . HTN (hypertension)   . Ischemic colon ([place])     a. 08/2016 - adm to  [facility] with a ruptured colon s/p right hemicolectomy with ileostomy formation for ischemic and necrotic colon.   . Lung nodule    a. followed by pulmonology  . Mild CAD    a. minor nonobstructive CAD 08/22/16.  . Nephrolithiasis    hx  . NICM (nonischemic cardiomyopathy) ([place])   . Orthostatic hypotension   . Osteoarthritis   . PAF (paroxysmal atrial fibrillation) ([place])    a. diagnosed 07/2016 s/p DCCV 08/01/16 with recurrence, managed with rate control for now.   . Pneumonia 2013  . PVC's (premature ventricular contractions)   . Shingles since Nov 18, 2013   on right arm from shoulder to wrist  . Status post dilation of esophageal narrowing   . Stroke [facility]) 2001   STROKES, TIA'S  . Syncope    a. 08/2016 felt due to orthostatic hypotension.  Marland Kitchen TIA (transient ischemic attack) last 2001   anticoagulation therapy on coumadin   Past Surgical History:  Procedure Laterality Date  . ANTERIOR AND POSTERIOR VAGINAL REPAIR  2008   A&P REPAIR-DR MCDIARMID  . ANTERIOR LATERAL LUMBAR FUSION 4 LEVELS Right 07/03/2015   Procedure: Right Lumbar one-two, Lumbar two-three, Lumbar three-four, Lumbar four-five Anterior lateral lumbar interbody fusion;  Surgeon: Erline Levine, MD;  Location: [facility];  Service: Neurosurgery;  Laterality: Right;  Right L1-2 L2-3 L3-4 L4-5 Anterior lateral lumbar interbody fusion  . BALLOON DILATION N/A 03/03/2014   Procedure: BALLOON DILATION;  Surgeon: Inda Castle, MD;  Location: [facility];  Service: Endoscopy;  Laterality: N/A;  . BLADDER SUSPENSION    . BREAST BIOPSY    . CARDIAC CATHETERIZATION N/A 08/22/2016   Procedure: Left Heart Cath and Coronary Angiography;  Surgeon: Peter M Martinique, MD;  Location: [place] CV LAB;  Service: Cardiovascular;  Laterality: N/A;  . CARDIOVERSION N/A 08/01/2016   Procedure: CARDIOVERSION;  Surgeon: Jerline Pain, MD;  Location: [facility];  Service: Cardiovascular;  Laterality: N/A;  .  CARDIOVERSION N/A 10/11/2016   Procedure: CARDIOVERSION;  Surgeon: Sueanne Margarita, MD;  Location: [facility];  Service: Cardiovascular;  Laterality: N/A;  . COLONOSCOPY    . ESOPHAGOGASTRODUODENOSCOPY N/A 03/03/2014   Procedure: ESOPHAGOGASTRODUODENOSCOPY (EGD);  Surgeon: Inda Castle, MD;  Location: [facility];  Service: Endoscopy;  Laterality: N/A;  . EXCISION OF BASAL CELL CA  2011  . GLUTEUS MINIMUS REPAIR Right 03/15/2016   Procedure: RIGHT HIP GLUTEUS MEDIUS TENDON REPAIR;  Surgeon: Paralee Cancel, MD;  Location: [facility];  Service: Orthopedics;  Laterality: Right;  . ileostomy reversal    . LUMBAR PERCUTANEOUS PEDICLE SCREW 4 LEVEL Right 07/03/2015   Procedure: Lumbar one-Sacral one Bilateral percutaneous pedicle screws;  Surgeon: Erline Levine, MD;  Location: [facility];  Service: Neurosurgery;  Laterality: Right;  L1-S1 Bilateral percutaneous pedicle screws  . RECTOCELE REPAIR  2008   A&P REPAIR -DR. MCDIARMID  . THUMB SURGERY Right 10-15 yrs ago  . UPPER GASTROINTESTINAL ENDOSCOPY    . VAGINAL HYSTERECTOMY  1983   NO BSO-DR. MABRY  . VARICOSE VEIN SURGERY      reports that she quit smoking about 22 years ago. Her smoking use included cigarettes. She started smoking about 63 years ago. She has a 126.00 pack-year smoking history. she has never used smokeless tobacco. She reports that she does not drink alcohol or use drugs. family history includes Breast cancer in her maternal aunt, maternal aunt, maternal aunt, and mother; COPD in her mother; Emphysema in her mother; Uterine cancer in her mother. Allergies  Allergen Reactions  . Clarithromycin Other (See Comments)    REACTION: possible rash but could have been legionarres dz with rash  . Lipitor [Atorvastatin] Other (See Comments)    MUSCLE SPASMS  . Simvastatin Other (See Comments)    Muscle and joint pain  . Cardizem [Diltiazem Hcl] Hives  . Contrast Media [Iodinated Diagnostic Agents]     FYI - during admission at [facility] 08/2016 patient developed hives, question related to cardizem or contrast dye - not clear  . Crestor [Rosuvastatin Calcium]     fagitue and joint pain, "NO STATINS"      Outpatient Encounter Medications as of 02/01/2018  Medication Sig  . albuterol (PROVENTIL HFA;VENTOLIN HFA) 108 (90 Base) MCG/ACT inhaler Inhale 1-2 puffs into the lungs every 6 (six) hours as needed for wheezing or shortness of breath.  Marland Kitchen apixaban (ELIQUIS) 5 MG TABS tablet Take 1 tablet (5 mg total) by mouth 2 (two) times daily.  Marland Kitchen azelastine (ASTELIN) 0.1 % nasal spray Place 2 sprays into the nose daily. Use in each nostril as directed  . benzonatate (TESSALON PERLES) 100 MG capsule Take 1 capsule (100 mg total) 3 (three) times daily as needed by mouth for cough.  Marland Kitchen buPROPion (WELLBUTRIN XL) 300 MG 24 hr tablet Take 1 tablet (300 mg total)  by mouth daily.  . Calcium Carbonate-Vitamin D (CALCIUM + D PO) Take 1 tablet by mouth 2 (two) times daily.   . ciclopirox (PENLAC) 8 % solution Apply at bedtime topically. Apply over nail and surrounding skin. Apply daily over previous coat. After seven (7) days, may remove with alcohol and continue cycle.  . cyclobenzaprine (FLEXERIL) 5 MG tablet Take 1 tablet (5 mg total) by mouth 3 (three) times daily as needed for muscle spasms.  . fluticasone (FLONASE) 50 MCG/ACT nasal spray Place 1 spray into both nostrils 2 (two) times daily.  . Fluticasone-Umeclidin-Vilant (TRELEGY ELLIPTA) 100-62.5-25 MCG/INH AEPB Inhale 1 puff into the lungs daily.  . Fluticasone-Umeclidin-Vilant (TRELEGY ELLIPTA) 100-62.5-25 MCG/INH AEPB Inhale 1 puff into the lungs daily.  . furosemide (LASIX) 20 MG tablet Take 1 tablet (20 mg total) by mouth as needed (swelling).  . gabapentin (NEURONTIN) 300 MG capsule 1 tab by mouth in AM, 1 po at midday, then 2 by mouth at bedtime for sleep and pain  . guaiFENesin (MUCINEX) 600 MG 12 hr tablet Take 1,200 mg by mouth 2 (two) times daily as needed for cough or to  loosen phlegm.  Marland Kitchen ipratropium (ATROVENT) 0.03 % nasal spray Place 2 sprays into both nostrils 3 (three) times daily as needed for rhinitis.  Marland Kitchen levothyroxine (SYNTHROID, LEVOTHROID) 25 MCG tablet Take 1 tablet (25 mcg total) by mouth daily before breakfast.  . Lidocaine HCl 2 % CREA Apply 1 application topically daily as needed (for shingles flares).  . LORazepam (ATIVAN) 0.5 MG tablet 1-2 tab by mouth at bedtime as needed for sleep  . Melatonin 5 MG TABS Take 5 mg by mouth at bedtime.  . metoprolol succinate (TOPROL-XL) 100 MG 24 hr tablet Take 100 mg by mouth daily. Take with or immediately following a meal.  . mirabegron ER (MYRBETRIQ) 50 MG TB24 tablet Take 50 mg by mouth daily.  . montelukast (SINGULAIR) 10 MG tablet Take 1 tablet (10 mg total) daily by mouth.  . Multiple Vitamin (MULTIVITAMIN) tablet Take 1 tablet by mouth daily.    Marland Kitchen nystatin (MYCOSTATIN) 100000 UNIT/ML suspension Take 5 cc by mouth, swish and swallow 4 times daily.  Marland Kitchen omeprazole (PRILOSEC) 40 MG capsule Take 1 capsule (40 mg total) by mouth daily.  . Probiotic Product (PROBIOTIC DAILY PO) Take by mouth.  . ranitidine (ZANTAC) 150 MG tablet Take 1 tablet (150 mg total) by mouth at bedtime.  Marland Kitchen Respiratory Therapy Supplies (FLUTTER) DEVI Use 2-3 times per day  . Spacer/Aero-Holding Chambers (AEROCHAMBER Z-STAT PLUS) inhaler Use as instructed  . TURMERIC PO Take 2 tablets by mouth daily.  Marland Kitchen ezetimibe (ZETIA) 10 MG tablet Take 1 tablet (10 mg total) daily by mouth.  . metoprolol succinate (TOPROL-XL) 100 MG 24 hr tablet Take 1 tablet (100 mg total) by mouth daily. Take with or immediately following a meal.  . [DISCONTINUED] valsartan (DIOVAN) 160 MG tablet Take 1 tablet (160 mg total) by mouth daily.   No facility-administered encounter medications on file as of 02/01/2018.      REVIEW OF SYSTEMS  : All other systems reviewed and negative except where noted in the History of Present Illness.   PHYSICAL EXAM: BP 120/70    Pulse 70   Ht 5\' 4"  (1.626 m)   Wt 151 lb (68.5 kg)   BMI 25.92 kg/m  General: Well developed white female in no acute distress Head: Normocephalic and atraumatic Eyes:  Sclerae anicteric, conjunctiva pink. Ears: Normal auditory acuity Lungs: Clear  throughout to auscultation; no increased WOB. Heart: Regular rate and rhythm; no M/R/G. Abdomen: Soft, non-distended.  BS present.  Non-tender.  Scars noted from her laparotomy. Musculoskeletal: Symmetrical with no gross deformities  Skin: No lesions on visible extremities Extremities: No edema  Neurological: Alert oriented x 4, grossly non-focal Psychological:  Alert and cooperative. Normal mood and affect  ASSESSMENT AND PLAN: *Loose stools:  Is suspect that this is going to be her new "normal" due to partial colectomy.  No alarm symptoms and stools start out as normal early in the day.  She will continue daily probiotic but consider switching to [employer].  Will try adding daily powder fiber supplement such as Benefiber or Citrucel to her regimen to help with bulking the stool.  Continue Imodium prn (and can certainly even take this daily if needed).  Could consider another trial of questran once or twice daily as well if it comes to it.  Can try IBgard for gas/bloating.  CC:  Biagio Borg, MD

## 2018-02-01 NOTE — Progress Notes (Signed)
Assessment and plan reviewed 

## 2018-02-02 ENCOUNTER — Encounter: Payer: Self-pay | Admitting: Sports Medicine

## 2018-02-02 DIAGNOSIS — R29898 Other symptoms and signs involving the musculoskeletal system: Secondary | ICD-10-CM | POA: Diagnosis not present

## 2018-02-02 DIAGNOSIS — M76829 Posterior tibial tendinitis, unspecified leg: Secondary | ICD-10-CM | POA: Diagnosis not present

## 2018-02-02 DIAGNOSIS — M84373S Stress fracture, unspecified ankle, sequela: Secondary | ICD-10-CM | POA: Diagnosis not present

## 2018-02-02 DIAGNOSIS — R269 Unspecified abnormalities of gait and mobility: Secondary | ICD-10-CM | POA: Diagnosis not present

## 2018-02-06 DIAGNOSIS — M76829 Posterior tibial tendinitis, unspecified leg: Secondary | ICD-10-CM | POA: Diagnosis not present

## 2018-02-06 DIAGNOSIS — M84373S Stress fracture, unspecified ankle, sequela: Secondary | ICD-10-CM | POA: Diagnosis not present

## 2018-02-06 DIAGNOSIS — R29898 Other symptoms and signs involving the musculoskeletal system: Secondary | ICD-10-CM | POA: Diagnosis not present

## 2018-02-06 DIAGNOSIS — R269 Unspecified abnormalities of gait and mobility: Secondary | ICD-10-CM | POA: Diagnosis not present

## 2018-02-09 DIAGNOSIS — M84373S Stress fracture, unspecified ankle, sequela: Secondary | ICD-10-CM | POA: Diagnosis not present

## 2018-02-09 DIAGNOSIS — M76829 Posterior tibial tendinitis, unspecified leg: Secondary | ICD-10-CM | POA: Diagnosis not present

## 2018-02-09 DIAGNOSIS — R29898 Other symptoms and signs involving the musculoskeletal system: Secondary | ICD-10-CM | POA: Diagnosis not present

## 2018-02-09 DIAGNOSIS — R269 Unspecified abnormalities of gait and mobility: Secondary | ICD-10-CM | POA: Diagnosis not present

## 2018-02-10 ENCOUNTER — Encounter: Payer: Self-pay | Admitting: Internal Medicine

## 2018-02-10 MED ORDER — BUPROPION HCL ER (XL) 300 MG PO TB24: 300.0000 mg | ORAL_TABLET | Freq: Every day | ORAL | 2 refills | Status: DC

## 2018-02-10 MED ORDER — FUROSEMIDE 20 MG PO TABS: 20.0000 mg | ORAL_TABLET | ORAL | 2 refills | Status: DC | PRN

## 2018-02-10 MED ORDER — LEVOTHYROXINE SODIUM 25 MCG PO TABS: 25.0000 ug | ORAL_TABLET | Freq: Every day | ORAL | 2 refills | Status: DC

## 2018-02-10 MED ORDER — LEVOTHYROXINE SODIUM 25 MCG PO TABS: 25.0000 ug | ORAL_TABLET | Freq: Every day | ORAL | 0 refills | Status: DC

## 2018-02-10 MED ORDER — FUROSEMIDE 20 MG PO TABS: 20.0000 mg | ORAL_TABLET | ORAL | 0 refills | Status: DC | PRN

## 2018-02-10 NOTE — Telephone Encounter (Signed)
Dr. Jenny Reichmann I have already sent in the lasix and levothyroxine.

## 2018-02-10 NOTE — Addendum Note (Signed)
Addended by: Biagio Borg on: 02/10/2018 11:09 AM   Modules accepted: Orders

## 2018-02-12 DIAGNOSIS — M84373S Stress fracture, unspecified ankle, sequela: Secondary | ICD-10-CM | POA: Diagnosis not present

## 2018-02-12 DIAGNOSIS — R269 Unspecified abnormalities of gait and mobility: Secondary | ICD-10-CM | POA: Diagnosis not present

## 2018-02-12 DIAGNOSIS — R29898 Other symptoms and signs involving the musculoskeletal system: Secondary | ICD-10-CM | POA: Diagnosis not present

## 2018-02-12 DIAGNOSIS — M76829 Posterior tibial tendinitis, unspecified leg: Secondary | ICD-10-CM | POA: Diagnosis not present

## 2018-02-15 ENCOUNTER — Ambulatory Visit (INDEPENDENT_AMBULATORY_CARE_PROVIDER_SITE_OTHER): Payer: Medicare Other | Admitting: Neurology

## 2018-02-15 DIAGNOSIS — M79604 Pain in right leg: Secondary | ICD-10-CM

## 2018-02-15 DIAGNOSIS — M84373S Stress fracture, unspecified ankle, sequela: Secondary | ICD-10-CM | POA: Diagnosis not present

## 2018-02-15 DIAGNOSIS — R269 Unspecified abnormalities of gait and mobility: Secondary | ICD-10-CM | POA: Diagnosis not present

## 2018-02-15 DIAGNOSIS — M76829 Posterior tibial tendinitis, unspecified leg: Secondary | ICD-10-CM | POA: Diagnosis not present

## 2018-02-15 DIAGNOSIS — R29898 Other symptoms and signs involving the musculoskeletal system: Secondary | ICD-10-CM | POA: Diagnosis not present

## 2018-02-15 NOTE — Procedures (Signed)
[facility]  [address] [place]  [place], [place] [address] Tel: [phone] Fax:  [phone] Test Date:  02/15/2018  Patient: [name] DOB: [birth year] Physician: Narda Amber, DO  Sex: Female Height: 5\' 4"  Ref Phys: Teresa Coombs, M.D.  ID#: [id number] Temp: 32.8C Technician:    Patient Complaints: This is a 77 year-old female referred for evaluation right leg pain and weakness.  NCV & EMG Findings: Extensive electrodiagnostic testing of the right lower extremity shows:  1. Right sural and superficial peroneal sensory responses are within normal limits. 2. Right peroneal motor response at the extensor digitorum brevis is very mildly reduced, and is normal at the tibialis anterior. Right tibial motor response is within normal limits. 3. Right tibial H reflex study is within normal limits.  4. There is no evidence of active or chronic motor axon loss changes affecting any of the tested muscles. Motor unit configuration and recruitment pattern is within normal limits.  Impression: This is a normal study of the right lower extremity. In particular, there is no evidence of a large fiber sensory motor polyneuropathy or lumbosacral radiculopathy.   ___________________________ Narda Amber, DO    Nerve Conduction Studies Anti Sensory Summary Table   Stim Site NR Peak (ms) Norm Peak (ms) P-T Amp (V) Norm P-T Amp  Right Sup Peroneal Anti Sensory (Ant Lat Mall)  32.8C  12 cm    3.1 <4.6 4.7 >3  Right Sural Anti Sensory (Lat Mall)  32.8C  Calf    2.9 <4.6 5.1 >3   Motor Summary Table   Stim Site NR Onset (ms) Norm Onset (ms) O-P Amp (mV) Norm O-P Amp Site1 Site2 Delta-0 (ms) Dist (cm) Vel (m/s) Norm Vel (m/s)  Right Peroneal Motor (Ext Dig Brev)  32.8C  Ankle    3.2 <6.0 2.4 >2.5 B Fib Ankle 7.1 34.0 48 >40  B Fib    10.3  2.0  Poplt B Fib 1.8 8.0 44 >40  Poplt    12.1  2.0         Right Peroneal TA Motor (Tib Ant)  32.8C  Fib Head    3.4 <4.5 4.5 >3  Poplit Fib Head 1.4 8.0 57 >40  Poplit    4.8  4.5         Right Tibial Motor (Abd Hall Brev)  32.8C  Ankle    3.7 <6.0 5.0 >4 Knee Ankle 7.7 42.0 55 >40  Knee    11.4  4.9          H Reflex Studies   NR H-Lat (ms) Lat Norm (ms) L-R H-Lat (ms)  Right Tibial (Gastroc)  32.8C     33.06 <35    EMG   Side Muscle Ins Act Fibs Psw Fasc Number Recrt Dur Dur. Amp Amp. Poly Poly. Comment  Right AntTibialis Nml Nml Nml Nml Nml Nml Nml Nml Nml Nml Nml Nml N/A  Right Gastroc Nml Nml Nml Nml Nml Nml Nml Nml Nml Nml Nml Nml N/A  Right RectFemoris Nml Nml Nml Nml Nml Nml Nml Nml Nml Nml Nml Nml N/A  Right GluteusMed Nml Nml Nml Nml Nml Nml Nml Nml Nml Nml Nml Nml N/A  Right BicepsFemS Nml Nml Nml Nml Nml Nml Nml Nml Nml Nml Nml Nml N/A      Waveforms:

## 2018-02-19 ENCOUNTER — Encounter: Payer: Self-pay | Admitting: Sports Medicine

## 2018-02-19 ENCOUNTER — Ambulatory Visit (INDEPENDENT_AMBULATORY_CARE_PROVIDER_SITE_OTHER): Payer: Medicare Other | Admitting: Sports Medicine

## 2018-02-19 VITALS — BP 110/82 | HR 71 | Ht 64.0 in | Wt 151.0 lb

## 2018-02-19 DIAGNOSIS — M84373A Stress fracture, unspecified ankle, initial encounter for fracture: Secondary | ICD-10-CM | POA: Diagnosis not present

## 2018-02-19 DIAGNOSIS — M25571 Pain in right ankle and joints of right foot: Secondary | ICD-10-CM | POA: Diagnosis not present

## 2018-02-19 DIAGNOSIS — G8929 Other chronic pain: Secondary | ICD-10-CM | POA: Diagnosis not present

## 2018-02-19 DIAGNOSIS — M76829 Posterior tibial tendinitis, unspecified leg: Secondary | ICD-10-CM

## 2018-02-19 DIAGNOSIS — Z8673 Personal history of transient ischemic attack (TIA), and cerebral infarction without residual deficits: Secondary | ICD-10-CM | POA: Diagnosis not present

## 2018-02-19 DIAGNOSIS — R29898 Other symptoms and signs involving the musculoskeletal system: Secondary | ICD-10-CM | POA: Diagnosis not present

## 2018-02-19 DIAGNOSIS — R269 Unspecified abnormalities of gait and mobility: Secondary | ICD-10-CM

## 2018-02-19 DIAGNOSIS — M79604 Pain in right leg: Secondary | ICD-10-CM

## 2018-02-19 NOTE — Progress Notes (Signed)
  [name] - 77 y.o. female MRN [id number]  Date of birth: Jul 28, 1941  Scribe for today's visit: [name], LAT, ATC     SUBJECTIVE:  [name] is here for Follow-up (R hip and foot pain) .   Compared to the last office visit, her previously described symptoms in hip are improving, she feels like she is getting stronger. She is still having pain in right ankle.   Current symptoms are moderate & are nonradiating She has been wearing her AirCast at all times unless she is in bed. She has also been wearing her Body Helix compression sleeve and feels that this has been beneficial. She has orthotic inserts in shoes and she is pleased with them.  She still has "twinge" in ankle when getting up at night to go to the bathroom and she has nothing on her feet.  Pt is ambulating with a cane.  Compared to her last office visit on 12/15/17, her previously described R hip weakness and R post tib tendon dysfunction are improving/worsening/no change. Current symptoms are mild/mod/severe and radiating/non-radiating. She has been using her Body Helix ankle compression sleeve, wearing her AirCast and using her custom orthotics.  She has been doing her HEP.   Compared to the last office visit on 01/26/18, her previously described R foot and hip pain symptoms show no change. Ankle is OK while walking but tender to touch. She reports improvement in hip pain. She continues to have swelling in the R foot. She reports that at times it feels like she is walking in water.  Current symptoms are mild & are nonradiating She has been wearing her Body Helix compression sleeve with some relief. She does have tenderness when walking without the compression sleeve. She had to stop taking Meloxicam d/t med interaction with heart medication. She also has orthotics in her shoes and she feels that these are beneficial.    ROS Denies night time disturbances. Denies fevers, chills, or night sweats. Denies unexplained  weight loss. Reports personal history of cancer. Denies changes in bowel or bladder habits. Reports recent unreported falls, fell down 5 stairs 1 week ago. Denies new or worsening dyspnea or wheezing. Denies headaches or dizziness.  Denies numbness, tingling or weakness  In the extremities.  Denies dizziness or presyncopal episodes Reports lower extremity edema      Please see additional documentation for Objective, Assessment and Plan sections. Pertinent additional documentation may be included in corresponding procedure notes, imaging studies, problem based documentation and patient instructions. Please see these sections of the encounter for additional information regarding this visit.  CMA/ATC served as Education administrator during this visit. History, Physical, and Plan performed by medical provider. Documentation and orders reviewed and attested to.      Gerda Diss, [facility] Physician

## 2018-02-20 ENCOUNTER — Encounter: Payer: Self-pay | Admitting: Sports Medicine

## 2018-02-20 DIAGNOSIS — R269 Unspecified abnormalities of gait and mobility: Secondary | ICD-10-CM | POA: Diagnosis not present

## 2018-02-20 DIAGNOSIS — R29898 Other symptoms and signs involving the musculoskeletal system: Secondary | ICD-10-CM | POA: Diagnosis not present

## 2018-02-20 DIAGNOSIS — M84373S Stress fracture, unspecified ankle, sequela: Secondary | ICD-10-CM | POA: Diagnosis not present

## 2018-02-20 DIAGNOSIS — M76829 Posterior tibial tendinitis, unspecified leg: Secondary | ICD-10-CM | POA: Diagnosis not present

## 2018-02-20 NOTE — Progress Notes (Signed)
   [name], [name] [name]  [name] - 77 y.o. female MRN [id number]  Date of birth: [birth year]  Visit Date: 02/19/2018  PCP: Biagio Borg, MD   Referred by: Biagio Borg, MD  Please see additional documentation for HPI, review of systems.  HISTORY & PERTINENT PRIOR DATA:  Prior History reviewed and updated per electronic medical record.  Significant history, findings, studies and interim changes include:  reports that she quit smoking about 22 years ago. Her smoking use included cigarettes. She started smoking about 63 years ago. She has a 126.00 pack-year smoking history. She has never used smokeless tobacco. Recent Labs    04/26/17 0801 10/13/17 1431  HGBA1C 6.3 5.8   No specialty comments available. No problems updated.  OBJECTIVE:  VS:  HT:5\' 4"  (162.6 cm)   WT:151 lb (68.5 kg)  BMI:25.91    BP:110/82  HR:71bpm  TEMP: ( )  RESP:94 %   PHYSICAL EXAM: Constitutional: WDWN, Non-toxic appearing. Psychiatric: Alert & appropriately interactive.  Not depressed or anxious appearing. Respiratory: No increased work of breathing.  Trachea Midline Eyes: Pupils are equal.  EOM intact without nystagmus.  No scleral icterus  NEUROVASCULAR exam: No clubbing or cyanosis appreciated No significant venous stasis changes Capillary Refill: normal, less than 2 seconds  Nerve conduction study reviewed from Dr. Serita Grit office that was reassuring.  She does have difficult time with ambulation.  Her ankle is less tender than in the past with direct palpation and her hip abduction strength is improving but continues to have a significantly antalgic gait.  ASSESSMENT & PLAN:   1. Right leg pain   2. Gait disturbance   3. Stress fracture of ankle, initial encounter   4. Posterior tibial tendon dysfunction   5. Weakness of right hip   6. Chronic pain of right ankle   7. History of CVA (cerebrovascular  accident)    Orders & Meds: No orders of the defined types were placed in this encounter.  No orders of the defined types were placed in this encounter.   PLAN:  >50% of this 25 minute visit spent in direct patient counseling and/or coordination of care.  Discussion was focused on education regarding the in discussing the pathoetiology and anticipated clinical course of the above condition.  Ultimately like her to continue with formal physical therapy.  She is making some progress.  Small modification to her orthotics was made today that did improve some comfort.  We will have her continue with this and follow-up in 8 weeks and check on her progress Lauren that time.       Follow-up: Return in about 8 weeks (around 04/16/2018).

## 2018-02-22 ENCOUNTER — Encounter: Payer: Self-pay | Admitting: Internal Medicine

## 2018-02-22 DIAGNOSIS — M76829 Posterior tibial tendinitis, unspecified leg: Secondary | ICD-10-CM | POA: Diagnosis not present

## 2018-02-22 DIAGNOSIS — M84373S Stress fracture, unspecified ankle, sequela: Secondary | ICD-10-CM | POA: Diagnosis not present

## 2018-02-22 DIAGNOSIS — R29898 Other symptoms and signs involving the musculoskeletal system: Secondary | ICD-10-CM | POA: Diagnosis not present

## 2018-02-22 DIAGNOSIS — R269 Unspecified abnormalities of gait and mobility: Secondary | ICD-10-CM | POA: Diagnosis not present

## 2018-02-22 MED ORDER — FUROSEMIDE 20 MG PO TABS: 20.0000 mg | ORAL_TABLET | ORAL | 1 refills | Status: DC | PRN

## 2018-02-26 ENCOUNTER — Ambulatory Visit
Admission: RE | Admit: 2018-02-26 | Discharge: 2018-02-26 | Disposition: A | Discharge status: Home / Self Care | Payer: Medicare Other | Source: Ambulatory Visit | Attending: Internal Medicine | Admitting: Internal Medicine

## 2018-02-26 DIAGNOSIS — Z1231 Encounter for screening mammogram for malignant neoplasm of breast: Secondary | ICD-10-CM | POA: Diagnosis not present

## 2018-02-27 DIAGNOSIS — N183 Chronic kidney disease, stage 3 (moderate): Secondary | ICD-10-CM | POA: Diagnosis not present

## 2018-02-28 DIAGNOSIS — R269 Unspecified abnormalities of gait and mobility: Secondary | ICD-10-CM | POA: Diagnosis not present

## 2018-02-28 DIAGNOSIS — R29898 Other symptoms and signs involving the musculoskeletal system: Secondary | ICD-10-CM | POA: Diagnosis not present

## 2018-02-28 DIAGNOSIS — M84373S Stress fracture, unspecified ankle, sequela: Secondary | ICD-10-CM | POA: Diagnosis not present

## 2018-02-28 DIAGNOSIS — M76829 Posterior tibial tendinitis, unspecified leg: Secondary | ICD-10-CM | POA: Diagnosis not present

## 2018-03-02 DIAGNOSIS — R29898 Other symptoms and signs involving the musculoskeletal system: Secondary | ICD-10-CM | POA: Diagnosis not present

## 2018-03-02 DIAGNOSIS — M76829 Posterior tibial tendinitis, unspecified leg: Secondary | ICD-10-CM | POA: Diagnosis not present

## 2018-03-02 DIAGNOSIS — R269 Unspecified abnormalities of gait and mobility: Secondary | ICD-10-CM | POA: Diagnosis not present

## 2018-03-02 DIAGNOSIS — M84373S Stress fracture, unspecified ankle, sequela: Secondary | ICD-10-CM | POA: Diagnosis not present

## 2018-03-03 ENCOUNTER — Encounter: Payer: Self-pay | Admitting: Cardiology

## 2018-03-03 ENCOUNTER — Encounter: Payer: Self-pay | Admitting: Internal Medicine

## 2018-03-05 DIAGNOSIS — R269 Unspecified abnormalities of gait and mobility: Secondary | ICD-10-CM | POA: Diagnosis not present

## 2018-03-05 DIAGNOSIS — M76829 Posterior tibial tendinitis, unspecified leg: Secondary | ICD-10-CM | POA: Diagnosis not present

## 2018-03-05 DIAGNOSIS — M84373S Stress fracture, unspecified ankle, sequela: Secondary | ICD-10-CM | POA: Diagnosis not present

## 2018-03-05 DIAGNOSIS — R29898 Other symptoms and signs involving the musculoskeletal system: Secondary | ICD-10-CM | POA: Diagnosis not present

## 2018-03-05 MED ORDER — GABAPENTIN 300 MG PO CAPS: ORAL_CAPSULE | ORAL | 1 refills | Status: DC

## 2018-03-08 DIAGNOSIS — R269 Unspecified abnormalities of gait and mobility: Secondary | ICD-10-CM | POA: Diagnosis not present

## 2018-03-08 DIAGNOSIS — M76829 Posterior tibial tendinitis, unspecified leg: Secondary | ICD-10-CM | POA: Diagnosis not present

## 2018-03-08 DIAGNOSIS — M84373S Stress fracture, unspecified ankle, sequela: Secondary | ICD-10-CM | POA: Diagnosis not present

## 2018-03-08 DIAGNOSIS — R29898 Other symptoms and signs involving the musculoskeletal system: Secondary | ICD-10-CM | POA: Diagnosis not present

## 2018-03-12 DIAGNOSIS — R29898 Other symptoms and signs involving the musculoskeletal system: Secondary | ICD-10-CM | POA: Diagnosis not present

## 2018-03-12 DIAGNOSIS — R269 Unspecified abnormalities of gait and mobility: Secondary | ICD-10-CM | POA: Diagnosis not present

## 2018-03-12 DIAGNOSIS — M84373S Stress fracture, unspecified ankle, sequela: Secondary | ICD-10-CM | POA: Diagnosis not present

## 2018-03-12 DIAGNOSIS — M76829 Posterior tibial tendinitis, unspecified leg: Secondary | ICD-10-CM | POA: Diagnosis not present

## 2018-03-14 DIAGNOSIS — R269 Unspecified abnormalities of gait and mobility: Secondary | ICD-10-CM | POA: Diagnosis not present

## 2018-03-14 DIAGNOSIS — M76829 Posterior tibial tendinitis, unspecified leg: Secondary | ICD-10-CM | POA: Diagnosis not present

## 2018-03-14 DIAGNOSIS — M84373S Stress fracture, unspecified ankle, sequela: Secondary | ICD-10-CM | POA: Diagnosis not present

## 2018-03-14 DIAGNOSIS — R29898 Other symptoms and signs involving the musculoskeletal system: Secondary | ICD-10-CM | POA: Diagnosis not present

## 2018-03-17 ENCOUNTER — Encounter: Payer: Self-pay | Admitting: Internal Medicine

## 2018-03-19 MED ORDER — APIXABAN 5 MG PO TABS: 5.0000 mg | ORAL_TABLET | Freq: Two times a day (BID) | ORAL | 0 refills | Status: DC

## 2018-03-21 ENCOUNTER — Encounter: Payer: Self-pay | Admitting: Cardiology

## 2018-03-22 ENCOUNTER — Telehealth: Payer: Self-pay | Admitting: Cardiology

## 2018-03-22 NOTE — Telephone Encounter (Signed)
lmtcb

## 2018-03-22 NOTE — Telephone Encounter (Signed)
See My Chart message for documentation

## 2018-03-22 NOTE — Telephone Encounter (Signed)
Informed Dr. Curt Bears recommends EKG before making advisement. Pt scheduled at next available visit, next Wednesday. Pt agreeable to plan.

## 2018-03-22 NOTE — Telephone Encounter (Signed)
New message   Patient returning call to nurse. Nurse unavailable. Please call

## 2018-03-24 ENCOUNTER — Encounter: Payer: Self-pay | Admitting: Internal Medicine

## 2018-03-28 ENCOUNTER — Ambulatory Visit (INDEPENDENT_AMBULATORY_CARE_PROVIDER_SITE_OTHER): Payer: Medicare Other

## 2018-03-28 ENCOUNTER — Encounter: Payer: Self-pay | Admitting: Internal Medicine

## 2018-03-28 VITALS — BP 136/77 | HR 77 | Ht 64.0 in | Wt 150.1 lb

## 2018-03-28 DIAGNOSIS — I4891 Unspecified atrial fibrillation: Secondary | ICD-10-CM

## 2018-03-28 NOTE — Patient Instructions (Signed)
Medication Instructions:  Your physician recommends that you continue on your current medications as directed. Please refer to the Current Medication list given to you today.  If you need a refill on your cardiac medications, please contact your pharmacy first.  Labwork: None ordered   Testing/Procedures: We performed an EKG today   Follow-Up: Keep your follow up appointment as scheduled.   Your physician recommends that you follow up with your primary MD regarding weakness.   Any Other Special Instructions Will Be Listed Below (If Applicable).   Thank you for choosing [facility], RN  [phone]  If you need a refill on your cardiac medications before your next appointment, please call your pharmacy.

## 2018-03-28 NOTE — Progress Notes (Signed)
1.) Reason for visit: EKG  2.) Name of MD requesting visit: Dr. Curt Bears   3.) H&P: Patient has h/o of paroxysmal afib (diagnosed 07/2016 s/p DCCV 08/01/16 with recurrence), CAD, chronic CHF, HTN, PVCs, CKD stage III  4.) ROS related to problem: Patient presented today for nurse visit and EKG. Pt presented with her husband in NAD. She is ambulating with a cane. VSS.  Patient c/o generalized weakness and fatigue. Patient compliant with all meds.   5.) Assessment and plan per MD: EKG performed and reviewed with Dr. Curt Bears. Pt in NSR, no changes in medication and patient advised to f/u with her primary MD. Patient states she has an upcoming appt with her pcp on 04/11/18. States she will call them today to see if she needs to be seen sooner. Patient verbalized understanding. Patient left in NAD.

## 2018-03-31 ENCOUNTER — Encounter: Payer: Self-pay | Admitting: Internal Medicine

## 2018-04-02 MED ORDER — LEVOTHYROXINE SODIUM 25 MCG PO TABS: 25.0000 ug | ORAL_TABLET | Freq: Every day | ORAL | 2 refills | Status: DC

## 2018-04-12 ENCOUNTER — Ambulatory Visit (INDEPENDENT_AMBULATORY_CARE_PROVIDER_SITE_OTHER): Payer: Medicare Other | Admitting: Internal Medicine

## 2018-04-12 ENCOUNTER — Encounter: Payer: Self-pay | Admitting: Internal Medicine

## 2018-04-12 VITALS — BP 132/66 | HR 76 | Temp 97.9°F | Ht 64.0 in | Wt 152.0 lb

## 2018-04-12 DIAGNOSIS — F329 Major depressive disorder, single episode, unspecified: Secondary | ICD-10-CM | POA: Diagnosis not present

## 2018-04-12 DIAGNOSIS — G4700 Insomnia, unspecified: Secondary | ICD-10-CM

## 2018-04-12 DIAGNOSIS — G8929 Other chronic pain: Secondary | ICD-10-CM

## 2018-04-12 DIAGNOSIS — F32A Depression, unspecified: Secondary | ICD-10-CM

## 2018-04-12 MED ORDER — TRAZODONE HCL 50 MG PO TABS: 25.0000 mg | ORAL_TABLET | Freq: Every evening | ORAL | 5 refills | Status: DC | PRN

## 2018-04-12 MED ORDER — TRAMADOL HCL (ER BIPHASIC) 200 MG PO CP24: ORAL_CAPSULE | ORAL | 2 refills | Status: DC

## 2018-04-12 MED ORDER — LEVOTHYROXINE SODIUM 25 MCG PO TABS: 25.0000 ug | ORAL_TABLET | Freq: Every day | ORAL | 3 refills | Status: DC

## 2018-04-12 NOTE — Progress Notes (Signed)
Subjective:    Patient ID: [name], female    DOB: May 27, 1941, 77 y.o.   MRN: [id number]  HPI  Here to f/u; overall doing ok,  Pt denies chest pain, increasing sob or doe, wheezing, orthopnea, PND, increased LE swelling, palpitations, dizziness or syncope.  Pt denies new neurological symptoms such as new headache, or facial or extremity weakness or numbness.  Pt denies polydipsia, polyuria, or low sugar episode.  Pt states overall good compliance with meds Wt Readings from Last 3 Encounters:  04/12/18 152 lb (68.9 kg)  03/28/18 150 lb 1.9 oz (68.1 kg)  02/19/18 151 lb (68.5 kg)  Has regained som wt but still with exhaustion and fatigue with heavy legs like walking in water ust from the parking lot in to stores.  C/o worsening DJD joint pain, now off mobic due to med conflict with other meds per pt, tumeric not working, hand arhtritis is particulalry bad.  Never did the right hand surgury per Dr Burney Gauze yet due to mult other issues that came up last yr.  Chronic pain overall not well controlled - hands mostly but "stiff all over" every am.  Taking tylenol arthritis and tumeric. Has not tried tramadol.  Taking gabapentin asd.  Cant sleep despite takin all meds and melatonin  She is wary of taking ativan further.  Did have a fall recently when off balance while wrestling with the storm door in the wind woith abrasion to left arm and left leg, rolled down stairs but ended with sitting up  No other new complaints or interval hx Past Medical History:  Diagnosis Date  . Allergic rhinitis   . Anxiety   . Atrial septal aneurysm   . Basal cell carcinoma   . Bradycardia   . Cataract    bil cataracts removed  . Chronic combined systolic and diastolic CHF (congestive heart failure) ([place])   . CKD (chronic kidney disease), stage III ([place])   . Clostridium difficile infection   . COPD (chronic obstructive pulmonary disease) ([place])   . Depression 11/29/2013  . DI (detrusor instability)   . Esophageal  stricture   . GERD (gastroesophageal reflux disease)   . Hemorrhoids   . Hiatal hernia   . History of kidney stones   . HLD (hyperlipidemia)   . HTN (hypertension)   . Ischemic colon ([place])    a. 08/2016 - adm to  [facility] with a ruptured colon s/p right hemicolectomy with ileostomy formation for ischemic and necrotic colon.   . Lung nodule    a. followed by pulmonology  . Mild CAD    a. minor nonobstructive CAD 08/22/16.  . Nephrolithiasis    hx  . NICM (nonischemic cardiomyopathy) ([place])   . Orthostatic hypotension   . Osteoarthritis   . PAF (paroxysmal atrial fibrillation) ([place])    a. diagnosed 07/2016 s/p DCCV 08/01/16 with recurrence, managed with rate control for now.   . Pneumonia 2013  . PVC's (premature ventricular contractions)   . Shingles since Nov 18, 2013   on right arm from shoulder to wrist  . Status post dilation of esophageal narrowing   . Stroke [facility]) 2001   STROKES, TIA'S  . Syncope    a. 08/2016 felt due to orthostatic hypotension.  Marland Kitchen TIA (transient ischemic attack) last 2001   anticoagulation therapy on coumadin   Past Surgical History:  Procedure Laterality Date  . ANTERIOR AND POSTERIOR VAGINAL REPAIR  2008   A&P REPAIR-DR MCDIARMID  . ANTERIOR  LATERAL LUMBAR FUSION 4 LEVELS Right 07/03/2015   Procedure: Right Lumbar one-two, Lumbar two-three, Lumbar three-four, Lumbar four-five Anterior lateral lumbar interbody fusion;  Surgeon: Erline Levine, MD;  Location: [place] [facility]UR[facility] ORS;  Service: Neurosurgery;  Laterality: Right;  Right L1-2 L2-3 L3-4 L4-5 Anterior lateral lumbar interbody fusion  . BALLOON DILATION N/A 03/03/2014   Procedure: BALLOON DILATION;  Surgeon: Inda Castle, MD;  Location: [facility] END[facility];  Service: Endoscopy;  Laterality: N/A;  . BLADDER SUSPENSION    . BREAST BIOPSY    . CARDIAC CATHETERIZATION N/A 08/22/2016   Procedure: Left Heart Cath and Coronary Angiography;  Surgeon: Peter M Martinique, MD;  Location: [place] CV LAB;  Service:  Cardiovascular;  Laterality: N/A;  . CARDIOVERSION N/A 08/01/2016   Procedure: CARDIOVERSION;  Surgeon: Jerline Pain, MD;  Location: [place] [facility];  Service: Cardiovascular;  Laterality: N/A;  . CARDIOVERSION N/A 10/11/2016   Procedure: CARDIOVERSION;  Surgeon: Sueanne Margarita, MD;  Location: [facility];  Service: Cardiovascular;  Laterality: N/A;  . COLONOSCOPY    . ESOPHAGOGASTRODUODENOSCOPY N/A 03/03/2014   Procedure: ESOPHAGOGASTRODUODENOSCOPY (EGD);  Surgeon: Inda Castle, MD;  Location: [facility];  Service: Endoscopy;  Laterality: N/A;  . EXCISION OF BASAL CELL CA  2011  . GLUTEUS MINIMUS REPAIR Right 03/15/2016   Procedure: RIGHT HIP GLUTEUS MEDIUS TENDON REPAIR;  Surgeon: Paralee Cancel, MD;  Location: [facility];  Service: Orthopedics;  Laterality: Right;  . ileostomy reversal    . LUMBAR PERCUTANEOUS PEDICLE SCREW 4 LEVEL Right 07/03/2015   Procedure: Lumbar one-Sacral one Bilateral percutaneous pedicle screws;  Surgeon: Erline Levine, MD;  Location: [place] [facility]UR[facility] ORS;  Service: Neurosurgery;  Laterality: Right;  L1-S1 Bilateral percutaneous pedicle screws  . RECTOCELE REPAIR  2008   A&P REPAIR -DR. MCDIARMID  . THUMB SURGERY Right 10-15 yrs ago  . UPPER GASTROINTESTINAL ENDOSCOPY    . VAGINAL HYSTERECTOMY  1983   NO BSO-DR. MABRY  . VARICOSE VEIN SURGERY      reports that she quit smoking about 22 years ago. Her smoking use included cigarettes. She started smoking about 63 years ago. She has a 126.00 pack-year smoking history. She has never used smokeless tobacco. She reports that she does not drink alcohol or use drugs. family history includes Breast cancer in her maternal aunt, maternal aunt, maternal aunt, and mother; COPD in her mother; Emphysema in her mother; Uterine cancer in her mother. Allergies  Allergen Reactions  . Clarithromycin Other (See Comments)    REACTION: possible rash but could have been legionarres dz with rash  . Lipitor [Atorvastatin] Other (See Comments)     MUSCLE SPASMS  . Simvastatin Other (See Comments)    Muscle and joint pain  . Cardizem [Diltiazem Hcl] Hives  . Contrast Media [Iodinated Diagnostic Agents]     FYI - during admission at [facility] 08/2016 patient developed hives, question related to cardizem or contrast dye - not clear  . Crestor [Rosuvastatin Calcium]     fagitue and joint pain, "NO STATINS"   Current Outpatient Medications on File Prior to Visit  Medication Sig Dispense Refill  . albuterol (PROVENTIL HFA;VENTOLIN HFA) 108 (90 Base) MCG/ACT inhaler Inhale 1-2 puffs into the lungs every 6 (six) hours as needed for wheezing or shortness of breath. 3 Inhaler 4  . apixaban (ELIQUIS) 5 MG TABS tablet Take 1 tablet (5 mg total) by mouth 2 (two) times daily. 180 tablet 0  . azelastine (ASTELIN) 0.1 % nasal spray Place 2 sprays into the  nose daily. Use in each nostril as directed 90 mL 1  . benzonatate (TESSALON PERLES) 100 MG capsule Take 1 capsule (100 mg total) 3 (three) times daily as needed by mouth for cough. 270 capsule 0  . buPROPion (WELLBUTRIN XL) 300 MG 24 hr tablet Take 1 tablet (300 mg total) by mouth daily. 90 tablet 2  . Calcium Carbonate-Vitamin D (CALCIUM + D PO) Take 1 tablet by mouth 2 (two) times daily.     . ciclopirox (PENLAC) 8 % solution Apply at bedtime topically. Apply over nail and surrounding skin. Apply daily over previous coat. After seven (7) days, may remove with alcohol and continue cycle. 6.6 mL 5  . cyclobenzaprine (FLEXERIL) 5 MG tablet Take 1 tablet (5 mg total) by mouth 3 (three) times daily as needed for muscle spasms. 270 tablet 0  . fluticasone (FLONASE) 50 MCG/ACT nasal spray Place 1 spray into both nostrils 2 (two) times daily. 48 g 1  . furosemide (LASIX) 20 MG tablet Take 1 tablet (20 mg total) by mouth as needed (swelling). 90 tablet 1  . gabapentin (NEURONTIN) 300 MG capsule 1 tab by mouth in AM, 1 po at midday, then 2 by mouth at bedtime for sleep and pain 360 capsule 1  .  guaiFENesin (MUCINEX) 600 MG 12 hr tablet Take 1,200 mg by mouth 2 (two) times daily as needed for cough or to loosen phlegm.    Marland Kitchen ipratropium (ATROVENT) 0.03 % nasal spray Place 2 sprays into both nostrils 3 (three) times daily as needed for rhinitis. 30 mL 12  . Lidocaine HCl 2 % CREA Apply 1 application topically daily as needed (for shingles flares).    . Melatonin 5 MG TABS Take 5 mg by mouth at bedtime.    . mirabegron ER (MYRBETRIQ) 50 MG TB24 tablet Take 50 mg by mouth daily.    . montelukast (SINGULAIR) 10 MG tablet Take 1 tablet (10 mg total) daily by mouth. 90 tablet 1  . Multiple Vitamin (MULTIVITAMIN) tablet Take 1 tablet by mouth daily.      Marland Kitchen nystatin (MYCOSTATIN) 100000 UNIT/ML suspension Take 5 cc by mouth, swish and swallow 4 times daily. 280 mL 0  . omeprazole (PRILOSEC) 40 MG capsule Take 1 capsule (40 mg total) by mouth daily. 90 capsule 3  . Probiotic Product (PROBIOTIC DAILY PO) Take by mouth.    . ranitidine (ZANTAC) 150 MG tablet Take 1 tablet (150 mg total) by mouth at bedtime. 90 tablet 1  . Respiratory Therapy Supplies (FLUTTER) DEVI Use 2-3 times per day 1 each 0  . TURMERIC PO Take 2 tablets by mouth daily.    Marland Kitchen ezetimibe (ZETIA) 10 MG tablet Take 1 tablet (10 mg total) daily by mouth. 90 tablet 3  . [DISCONTINUED] valsartan (DIOVAN) 160 MG tablet Take 1 tablet (160 mg total) by mouth daily. 90 tablet 3   No current facility-administered medications on file prior to visit.    Review of Systems Constitutional: Negative for other unusual diaphoresis, sweats, appetite or weight changes HENT: Negative for other worsening hearing loss, ear pain, facial swelling, mouth sores or neck stiffness.   Eyes: Negative for other worsening pain, redness or other visual disturbance.  Respiratory: Negative for other stridor or swelling Cardiovascular: Negative for other palpitations or other chest pain  Gastrointestinal: Negative for worsening diarrhea or loose stools, blood in  stool, distention or other pain Genitourinary: Negative for hematuria, flank pain or other change in urine volume.  Musculoskeletal: Negative  for myalgias or other joint swelling.  Skin: Negative for other color change, or other wound or worsening drainage.  Neurological: Negative for other syncope or numbness. Hematological: Negative for other adenopathy or swelling Psychiatric/Behavioral: Negative for hallucinations, other worsening agitation, SI, self-injury, or new decreased concentration All other system neg per pt    Objective:   Physical Exam BP 132/66 (BP Location: Left Arm, Patient Position: Sitting, Cuff Size: Normal)   Pulse 76   Temp 97.9 F (36.6 C) (Oral)   Ht 5\' 4"  (1.626 m)   Wt 152 lb (68.9 kg)   SpO2 96%   BMI 26.09 kg/m  VS noted, fatigued Constitutional: Pt is oriented to person, place, and time. Appears well-developed and well-nourished, in no significant distress and comfortable Head: Normocephalic and atraumatic  Eyes: Conjunctivae and EOM are normal. Pupils are equal, round, and reactive to light Right Ear: External ear normal without discharge Left Ear: External ear normal without discharge Nose: Nose without discharge or deformity Mouth/Throat: Oropharynx is without other ulcerations and moist  Neck: Normal range of motion. Neck supple. No JVD present. No tracheal deviation present or significant neck LA or mass Cardiovascular: Normal rate, regular rhythm, normal heart sounds and intact distal pulses.   Pulmonary/Chest: WOB normal and breath sounds without rales or wheezing  Abdominal: Soft. Bowel sounds are normal. NT. No HSM  Musculoskeletal: Normal range of motion. Exhibits no edema Lymphadenopathy: Has no other cervical adenopathy.  Neurological: Pt is alert and oriented to person, place, and time. Pt has normal reflexes. No cranial nerve deficit. Motor grossly intact, Gait antalgic and some unsteady Skin: Skin is warm and dry. No rash noted or new  ulcerations Psychiatric:  Has depressed mood and affect. Behavior is normal without agitation No other exam findings       Assessment & Plan:

## 2018-04-12 NOTE — Patient Instructions (Addendum)
Ok to stop the lorazepam (ativan)  Please take all new medication as prescribe - the trazodone for sleep  Please take all new medication as prescribed - the tramadol ER 200 mg as needed for pain (once per day)  Please continue all other medications as before, and refills have been done if requested.  Please have the pharmacy call with any other refills you may need.  Please continue your efforts at being more active, low cholesterol diet, and weight control.  Please keep your appointments with your specialists as you may have planned  Please return in 6 months, or sooner if needed

## 2018-04-14 ENCOUNTER — Encounter: Payer: Self-pay | Admitting: Internal Medicine

## 2018-04-14 DIAGNOSIS — G8929 Other chronic pain: Secondary | ICD-10-CM | POA: Insufficient documentation

## 2018-04-14 NOTE — Assessment & Plan Note (Addendum)
Mild worsening, no SI or HI, cont same tx, decliens change in tx  Note:  Total time for pt hx, exam, review of record with pt in the room, determination of diagnoses and plan for further eval and tx is > 40 min, with over 50% spent in coordination and counseling of patient including the differential dx, tx, further evaluation and other management of depression, anxiety, insomnia and chronic pain

## 2018-04-14 NOTE — Assessment & Plan Note (Signed)
Chronic persistent, for trazodone qhs prn, consider ambien if not effective

## 2018-04-14 NOTE — Assessment & Plan Note (Signed)
[place] for tramadol ER 200 qd,  to f/u any worsening symptoms or concerns

## 2018-04-16 ENCOUNTER — Ambulatory Visit (INDEPENDENT_AMBULATORY_CARE_PROVIDER_SITE_OTHER): Payer: Medicare Other

## 2018-04-16 ENCOUNTER — Encounter: Payer: Self-pay | Admitting: Sports Medicine

## 2018-04-16 ENCOUNTER — Ambulatory Visit (INDEPENDENT_AMBULATORY_CARE_PROVIDER_SITE_OTHER): Payer: Medicare Other | Admitting: Sports Medicine

## 2018-04-16 VITALS — BP 112/80 | HR 72 | Ht 64.0 in | Wt 152.0 lb

## 2018-04-16 DIAGNOSIS — R269 Unspecified abnormalities of gait and mobility: Secondary | ICD-10-CM | POA: Diagnosis not present

## 2018-04-16 DIAGNOSIS — M76829 Posterior tibial tendinitis, unspecified leg: Secondary | ICD-10-CM

## 2018-04-16 DIAGNOSIS — M19071 Primary osteoarthritis, right ankle and foot: Secondary | ICD-10-CM

## 2018-04-16 DIAGNOSIS — M84373A Stress fracture, unspecified ankle, initial encounter for fracture: Secondary | ICD-10-CM | POA: Diagnosis not present

## 2018-04-16 DIAGNOSIS — M25561 Pain in right knee: Secondary | ICD-10-CM

## 2018-04-16 DIAGNOSIS — S8991XA Unspecified injury of right lower leg, initial encounter: Secondary | ICD-10-CM | POA: Diagnosis not present

## 2018-04-16 DIAGNOSIS — M79604 Pain in right leg: Secondary | ICD-10-CM | POA: Diagnosis not present

## 2018-04-16 MED ORDER — COLCHICINE 0.6 MG PO CAPS: 1.0000 | ORAL_CAPSULE | Freq: Every day | ORAL | 1 refills | Status: DC | PRN

## 2018-04-16 NOTE — Patient Instructions (Signed)
Look into

## 2018-04-16 NOTE — Progress Notes (Signed)
[name]. [name], [place] at [place]  [name] - 77 y.o. female MRN [id number]  Date of birth: 06-10-1941  Visit Date: 04/16/2018  PCP: Biagio Borg, MD   Referred by: Biagio Borg, MD  Scribe for today's visit: Josepha Pigg, CMA     SUBJECTIVE:  [name] is here for Follow-up (R leg pain, gait disturbance, weakness in R hip. )  Compared to the last office visit, her previously described symptoms in hip are improving, she feels like she is getting stronger. She is still having pain in right ankle.   Current symptoms are moderate & are nonradiating She has been wearing her AirCast at all times unless she is in bed. She has also been wearing her Body Helix compression sleeve and feels that this has been beneficial. She has orthotic inserts in shoes and she is pleased with them.  She still has "twinge" in ankle when getting up at night to go to the bathroom and she has nothing on her feet.  Pt is ambulating with a cane.  Compared to her last office visit on 12/15/17, her previously described R hip weakness and R post tib tendon dysfunction are improving/worsening/no change. Current symptoms are mild/mod/severe and radiating/non-radiating. She has been using her Body Helix ankle compression sleeve, wearing her AirCast and using her custom orthotics.  She has been doing her HEP.   Compared to the last office visit on 01/26/18, her previously described R foot and hip pain symptoms show no change. Ankle is OK while walking but tender to touch. She reports improvement in hip pain. She continues to have swelling in the R foot. She reports that at times it feels like she is walking in water.  Current symptoms are mild & are nonradiating She has been wearing her Body Helix compression sleeve with some relief. She does have tenderness when walking without the compression sleeve. She had to stop taking Meloxicam d/t med  interaction with heart medication. She also has orthotics in her shoes and she feels that these are beneficial.   04/16/2018 Pt c/o pain and swelling of R knee. She fell early March (just prior to last OV) and pain started about 1 week later. She has tried using Voltaren and icing the knee with some relief. Pain is around the patella and swelling is mostly medial. She feels occasional sharp shooting pains in the knee. She is noticed increased warmth around the knee but denies erythema.   She reports improvement in ankle pain but continues to have pain and swelling in both feet, R>L. R foot pain is mostly on the medial aspect of the foot. Pain seems to be worse in the mornings and after being sedentary. She feels that her orthotics have been beneficial since having then adjusted at her last visit.   R hip pain/weakness is the same. She was discharged from PT about 1 month ago. She continues to ambulate with a cane.   ROS Denies night time disturbances. Denies fevers, chills, or night sweats. Denies unexplained weight loss. Reports personal history of cancer. Denies changes in bowel or bladder habits. Denies recent unreported falls. Denies new or worsening dyspnea or wheezing. Denies headaches or dizziness.  Reports numbness, tingling or weakness  In the extremities.  Denies dizziness or presyncopal episodes Reports lower extremity edema    HISTORY & PERTINENT PRIOR DATA:  Prior History reviewed and updated per electronic medical record.  Significant/pertinent  history, findings, studies include:  reports that she quit smoking about 22 years ago. Her smoking use included cigarettes. She started smoking about 63 years ago. She has a 126.00 pack-year smoking history. She has never used smokeless tobacco. Recent Labs    04/26/17 0801 10/13/17 1431  HGBA1C 6.3 5.8   No specialty comments available. No problems updated.  OBJECTIVE:  VS:  HT:5\' 4"  (162.6 cm)   WT:152 lb (68.9 kg)   BMI:26.08    BP:112/80  HR:72bpm  TEMP: ( )  RESP:95 %   PHYSICAL EXAM: Constitutional: WDWN, Non-toxic appearing. Psychiatric: Alert & appropriately interactive.  Not depressed or anxious appearing. Respiratory: No increased work of breathing.  Trachea Midline Eyes: Pupils are equal.  EOM intact without nystagmus.  No scleral icterus  Vascular Exam: warm to touch no edema  lower extremity neuro exam: unremarkable  MSK Exam: Right ankle is overall improved.  She has decreased swelling.  No focal bony tenderness.  Her knee has minimal swelling.  She has no significant ligamentous laxity.  Mild pain with McMurray's.  Right foot has swelling across the midfoot consistent with osteoarthritis.   ASSESSMENT & PLAN:   1. Right knee pain, unspecified chronicity   2. Gait disturbance   3. Stress fracture of ankle, initial encounter   4. Posterior tibial tendon dysfunction   5. Right leg pain   6. Arthritis of midtarsal joint of right foot     PLAN: She seems to be doing overall improved but did have an acute flareup of her knee pain.  We will have her try a prescription of colchicine to see if this can be beneficial for both diagnostic and therapeutic purposes.  If persistent ongoing pain or any acute flareup similar to what happened last week we will plan to have her come in sooner to consider aspiration injection but at this point she does seem to be doing better.  Otherwise we will check in with her in 3 months and have her continue working on strengthening and conditioning.  Follow-up: Return in about 3 months (around 07/09/2018).      Please see additional documentation for Objective, Assessment and Plan sections. Pertinent additional documentation may be included in corresponding procedure notes, imaging studies, problem based documentation and patient instructions. Please see these sections of the encounter for additional information regarding this visit.  CMA/ATC served as Education administrator  during this visit. History, Physical, and Plan performed by medical provider. Documentation and orders reviewed and attested to.      Gerda Diss, [facility] Physician

## 2018-04-21 ENCOUNTER — Encounter: Payer: Self-pay | Admitting: Internal Medicine

## 2018-04-21 ENCOUNTER — Encounter: Payer: Self-pay | Admitting: Sports Medicine

## 2018-04-23 ENCOUNTER — Other Ambulatory Visit: Payer: Self-pay

## 2018-04-23 ENCOUNTER — Encounter: Payer: Self-pay | Admitting: Sports Medicine

## 2018-04-23 MED ORDER — MONTELUKAST SODIUM 10 MG PO TABS: 10.0000 mg | ORAL_TABLET | Freq: Every day | ORAL | 3 refills | Status: DC

## 2018-04-23 MED ORDER — COLCHICINE 0.6 MG PO TABS: 0.6000 mg | ORAL_TABLET | Freq: Every day | ORAL | 1 refills | Status: DC | PRN

## 2018-04-23 MED ORDER — TRAZODONE HCL 50 MG PO TABS: 25.0000 mg | ORAL_TABLET | Freq: Every evening | ORAL | 1 refills | Status: DC | PRN

## 2018-04-26 DIAGNOSIS — M25641 Stiffness of right hand, not elsewhere classified: Secondary | ICD-10-CM | POA: Diagnosis not present

## 2018-04-26 DIAGNOSIS — M19049 Primary osteoarthritis, unspecified hand: Secondary | ICD-10-CM | POA: Insufficient documentation

## 2018-04-26 DIAGNOSIS — M25642 Stiffness of left hand, not elsewhere classified: Secondary | ICD-10-CM | POA: Diagnosis not present

## 2018-04-26 DIAGNOSIS — M79642 Pain in left hand: Secondary | ICD-10-CM | POA: Diagnosis not present

## 2018-04-26 DIAGNOSIS — M79641 Pain in right hand: Secondary | ICD-10-CM | POA: Diagnosis not present

## 2018-04-27 ENCOUNTER — Telehealth: Payer: Self-pay

## 2018-04-27 NOTE — Telephone Encounter (Signed)
Patient with diagnosis of Afib with stroke/TIA on Eliquis for anticoagulation.    Procedure: joint fusion Date of procedure: 06/18/18  CHADS2-VASc score of  At least 7 (CHF, HTN, AGE, DM2, stroke/tia x 2, CAD, AGE, female)  CrCl 61ml/min  Per office protocol, patient can hold Eliquis for 24 hours prior to procedure.  With history of stroke/TIA would recommend resume Eliquis as soon as safe post procedure.

## 2018-04-27 NOTE — Telephone Encounter (Signed)
   [facility] Pre-operative Risk Assessment    Request for surgical clearance:  1. What type of surgery is being performed? Right Index Finger Proximal Interphalangeal Joint Fusion   2. When is this surgery scheduled? 06/18/2018   3. What type of clearance is required (medical clearance vs. Pharmacy clearance to hold med vs. Both)? Both  4. Are there any medications that need to be held prior to surgery and how long? Not Listed; ELIQUIS   5. Practice name and name of physician performing surgery? [facility]  6. What is your office phone number?  [phone]    7.   What is your office fax number?  [phone], Cyndee Brightly RN  8.   Anesthesia type (None, local, MAC, general) ? General/Axillary Block   Joaquim Lai 04/27/2018, 12:10 PM  _________________________________________________________________   (provider comments below)

## 2018-04-27 NOTE — Telephone Encounter (Signed)
Will check with pharm

## 2018-04-29 ENCOUNTER — Encounter: Payer: Self-pay | Admitting: Pulmonary Disease

## 2018-05-01 ENCOUNTER — Encounter: Payer: Self-pay | Admitting: Pulmonary Disease

## 2018-05-01 NOTE — Telephone Encounter (Signed)
   Primary Cardiologist: Dr. Curt Bears.  Chart reviewed as part of pre-operative protocol coverage. Given past medical history and time since last visit, based on ACC/AHA guidelines, LOY LITTLE would be at acceptable risk for the planned procedure without further cardiovascular testing. I spoke with pt via phone and she denies anginal symptomatolgy.   Per pharmacy and office protocol, patient can hold Eliquis for 24 hours prior to procedure.  With history of stroke/TIA would recommend resume Eliquis as soon as safe post procedure.    I will route this recommendation to the requesting party via Epic fax function and remove from pre-op pool.  Please call with questions.  Lyda Jester, PA-C 05/01/2018, 2:06 PM

## 2018-05-12 ENCOUNTER — Encounter: Payer: Self-pay | Admitting: Cardiology

## 2018-05-14 MED ORDER — METOPROLOL SUCCINATE ER 100 MG PO TB24: 100.0000 mg | ORAL_TABLET | Freq: Every day | ORAL | 2 refills | Status: DC

## 2018-05-14 NOTE — Telephone Encounter (Signed)
Verified pt still taking Toprol 100 mg daily. Pt confirms. Rx sent to pharmacy

## 2018-05-31 DIAGNOSIS — N3941 Urge incontinence: Secondary | ICD-10-CM | POA: Diagnosis not present

## 2018-05-31 DIAGNOSIS — R350 Frequency of micturition: Secondary | ICD-10-CM | POA: Diagnosis not present

## 2018-06-02 ENCOUNTER — Encounter: Payer: Self-pay | Admitting: Internal Medicine

## 2018-06-02 DIAGNOSIS — K2100 Gastro-esophageal reflux disease with esophagitis, without bleeding: Secondary | ICD-10-CM

## 2018-06-02 DIAGNOSIS — K222 Esophageal obstruction: Secondary | ICD-10-CM

## 2018-06-02 MED ORDER — OMEPRAZOLE 40 MG PO CPDR: 40.0000 mg | DELAYED_RELEASE_CAPSULE | Freq: Every day | ORAL | 1 refills | Status: DC

## 2018-06-05 NOTE — Progress Notes (Signed)
Electrophysiology Office Note   Date:  06/06/2018   ID:  [name], [name] 1941-09-17, MRN [id number]  PCP:  Biagio Borg, MD  Primary Electrophysiologist:  Constance Haw, MD    Chief Complaint  Patient presents with  . Follow-up    PAF     History of Present Illness: [name] is a 77 y.o. female who presents today for electrophysiology evaluation.   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 Admitted 08/17/16 with ruptured colon status post right hemicolectomy with ileostomy for ischemic and necrotic colon.  Cardioversion 10/11/16. He also had ileostomy takedown and repair that was complicated by C. difficile.  Today, denies symptoms of palpitations, chest pain, shortness of breath, orthopnea, PND, lower extremity edema, claudication, dizziness, presyncope, syncope, bleeding, or neurologic sequela. The patient is tolerating medications without difficulties.  Overall she is doing well.  She does continue to have fatigue.  She is planning to have left hand surgery on Monday.  Other than her fatigue, she has no major complaints.  Past Medical History:  Diagnosis Date  . Allergic rhinitis   . Anxiety   . Atrial septal aneurysm   . Basal cell carcinoma   . Bradycardia   . Cataract    bil cataracts removed  . Chronic combined systolic and diastolic CHF (congestive heart failure) ([place])   . CKD (chronic kidney disease), stage III ([place])   . Clostridium difficile infection   . COPD (chronic obstructive pulmonary disease) ([place])   . Depression 11/29/2013  . DI (detrusor instability)   . Esophageal stricture   . GERD (gastroesophageal reflux disease)   .  Hemorrhoids   . Hiatal hernia   . History of kidney stones   . HLD (hyperlipidemia)   . HTN (hypertension)   . Ischemic colon ([place])    a. 08/2016 - adm to  [facility] with a ruptured colon s/p right hemicolectomy with ileostomy formation for ischemic and necrotic colon.   . Lung nodule    a. followed by pulmonology  . Mild CAD    a. minor nonobstructive CAD 08/22/16.  . Nephrolithiasis    hx  . NICM (nonischemic cardiomyopathy) ([place])   . Orthostatic hypotension   . Osteoarthritis   . PAF (paroxysmal atrial fibrillation) ([place])    a. diagnosed 07/2016 s/p DCCV 08/01/16 with recurrence, managed with rate control for now.   . Pneumonia 2013  . PVC's (premature ventricular contractions)   . Shingles since Nov 18, 2013   on right arm from shoulder to wrist  . Status post dilation of esophageal narrowing   . Stroke [facility]) 2001   STROKES, TIA'S  . Syncope    a. 08/2016 felt due to orthostatic hypotension.  Marland Kitchen TIA (transient ischemic attack) last 2001   anticoagulation therapy on coumadin   Past Surgical History:  Procedure Laterality Date  . ANTERIOR AND POSTERIOR VAGINAL REPAIR  2008   A&P REPAIR-DR MCDIARMID  . ANTERIOR LATERAL LUMBAR FUSION 4 LEVELS Right 07/03/2015   Procedure: Right Lumbar one-two, Lumbar two-three, Lumbar three-four, Lumbar four-five Anterior lateral lumbar interbody fusion;  Surgeon: Erline Levine, MD;  Location: [facility];  Service: Neurosurgery;  Laterality: Right;  Right L1-2 L2-3 L3-4 L4-5 Anterior  lateral lumbar interbody fusion  . BALLOON DILATION N/A 03/03/2014   Procedure: BALLOON DILATION;  Surgeon: Inda Castle, MD;  Location: [facility];  Service: Endoscopy;  Laterality: N/A;  . BLADDER SUSPENSION    . BREAST BIOPSY    . CARDIAC CATHETERIZATION N/A 08/22/2016   Procedure: Left Heart Cath and Coronary Angiography;  Surgeon: Peter M Martinique, MD;  Location: [facility];  Service: Cardiovascular;  Laterality: N/A;  . CARDIOVERSION N/A  08/01/2016   Procedure: CARDIOVERSION;  Surgeon: Jerline Pain, MD;  [facility];  Service: Cardiovascular;  Laterality: N/A;  . CARDIOVERSION N/A 10/11/2016   Procedure: CARDIOVERSION;  Surgeon: Sueanne Margarita, MD;  Location: [facility];  Service: Cardiovascular;  Laterality: N/A;  . COLONOSCOPY    . ESOPHAGOGASTRODUODENOSCOPY N/A 03/03/2014   Procedure: ESOPHAGOGASTRODUODENOSCOPY (EGD);  Surgeon: Inda Castle, MD;  Location: [facility];  Service: Endoscopy;  Laterality: N/A;  . EXCISION OF BASAL CELL CA  2011  . GLUTEUS MINIMUS REPAIR Right 03/15/2016   Procedure: RIGHT HIP GLUTEUS MEDIUS TENDON REPAIR;  Surgeon: Paralee Cancel, MD;  Location: [facility];  Service: Orthopedics;  Laterality: Right;  . ileostomy reversal    . LUMBAR PERCUTANEOUS PEDICLE SCREW 4 LEVEL Right 07/03/2015   Procedure: Lumbar one-Sacral one Bilateral percutaneous pedicle screws;  Surgeon: Erline Levine, MD;  Location: [place] [facility];  Service: Neurosurgery;  Laterality: Right;  L1-S1 Bilateral percutaneous pedicle screws  . RECTOCELE REPAIR  2008   A&P REPAIR -DR. MCDIARMID  . THUMB SURGERY Right 10-15 yrs ago  . UPPER GASTROINTESTINAL ENDOSCOPY    . VAGINAL HYSTERECTOMY  1983   NO BSO-DR. MABRY  . VARICOSE VEIN SURGERY       Current Outpatient Medications  Medication Sig Dispense Refill  . albuterol (PROVENTIL HFA;VENTOLIN HFA) 108 (90 Base) MCG/ACT inhaler Inhale 1-2 puffs into the lungs every 6 (six) hours as needed for wheezing or shortness of breath. 3 Inhaler 4  . apixaban (ELIQUIS) 5 MG TABS tablet Take 1 tablet (5 mg total) by mouth 2 (two) times daily. 180 tablet 0  . azelastine (ASTELIN) 0.1 % nasal spray Place 2 sprays into the nose daily. Use in each nostril as directed 90 mL 1  . benzonatate (TESSALON PERLES) 100 MG capsule Take 1 capsule (100 mg total) 3 (three) times daily as needed by mouth for cough. 270 capsule 0  . buPROPion (WELLBUTRIN XL) 300 MG 24 hr tablet Take 1 tablet (300 mg  total) by mouth daily. 90 tablet 2  . Calcium Carbonate-Vitamin D (CALCIUM + D PO) Take 1 tablet by mouth 2 (two) times daily.     . colchicine 0.6 MG tablet Take 1 tablet (0.6 mg total) by mouth daily as needed. 90 tablet 1  . cyclobenzaprine (FLEXERIL) 5 MG tablet Take 1 tablet (5 mg total) by mouth 3 (three) times daily as needed for muscle spasms. 270 tablet 0  . fluticasone (FLONASE) 50 MCG/ACT nasal spray Place 1 spray into both nostrils 2 (two) times daily. 48 g 1  . furosemide (LASIX) 20 MG tablet Take 1 tablet (20 mg total) by mouth as needed (swelling). 90 tablet 1  . gabapentin (NEURONTIN) 300 MG capsule 1 tab by mouth in AM, 1 po at midday, then 2 by mouth at bedtime for sleep and pain 360 capsule 1  . guaiFENesin (MUCINEX) 600 MG 12 hr tablet Take 1,200 mg by mouth 2 (two) times daily as needed for cough or to loosen phlegm.    Marland Kitchen  ipratropium (ATROVENT) 0.03 % nasal spray Place 2 sprays into both nostrils 3 (three) times daily as needed for rhinitis. 30 mL 12  . levothyroxine (SYNTHROID, LEVOTHROID) 25 MCG tablet Take 1 tablet (25 mcg total) by mouth daily before breakfast. 90 tablet 3  . Lidocaine HCl 2 % CREA Apply 1 application topically daily as needed (for shingles flares).    . Melatonin 5 MG TABS Take 5 mg by mouth at bedtime.    . metoprolol succinate (TOPROL-XL) 100 MG 24 hr tablet Take 1 tablet (100 mg total) by mouth daily. Take with or immediately following a meal. 90 tablet 2  . mirabegron ER (MYRBETRIQ) 50 MG TB24 tablet Take 50 mg by mouth daily.    . montelukast (SINGULAIR) 10 MG tablet Take 1 tablet (10 mg total) by mouth daily. 90 tablet 3  . Multiple Vitamin (MULTIVITAMIN) tablet Take 1 tablet by mouth daily.      Marland Kitchen nystatin (MYCOSTATIN) 100000 UNIT/ML suspension Take 5 cc by mouth, swish and swallow 4 times daily. 280 mL 0  . omeprazole (PRILOSEC) 40 MG capsule Take 1 capsule (40 mg total) by mouth daily. 90 capsule 1  . Probiotic Product (PROBIOTIC DAILY PO) Take by  mouth.    . ranitidine (ZANTAC) 150 MG tablet Take 1 tablet (150 mg total) by mouth at bedtime. 90 tablet 1  . Respiratory Therapy Supplies (FLUTTER) DEVI Use 2-3 times per day 1 each 0  . traZODone (DESYREL) 50 MG tablet Take 0.5-1 tablets (25-50 mg total) by mouth at bedtime as needed for sleep. 90 tablet 1  . ezetimibe (ZETIA) 10 MG tablet Take 1 tablet (10 mg total) daily by mouth. 90 tablet 3   No current facility-administered medications for this visit.     Allergies:   Clarithromycin; Lipitor [atorvastatin]; Simvastatin; Cardizem [diltiazem hcl]; Contrast media [iodinated diagnostic agents]; Crestor [rosuvastatin calcium]; and Rosuvastatin calcium   Social History:  The patient  reports that she quit smoking about 22 years ago. Her smoking use included cigarettes. She started smoking about 63 years ago. She has a 126.00 pack-year smoking history. She has never used smokeless tobacco. She reports that she does not drink alcohol or use drugs.   Family History:  The patient's family history includes Breast cancer in her maternal aunt, maternal aunt, maternal aunt, and mother; COPD in her mother; Emphysema in her mother; Uterine cancer in her mother.    ROS:  Please see the history of present illness.   Otherwise, review of systems is positive for leg swelling, diarrhea, joint swelling, easy bruising.   All other systems are reviewed and negative.   PHYSICAL EXAM: VS:  BP 128/72   Pulse 64   Ht 5\' 4"  (1.626 m)   Wt 149 lb 12.8 oz (67.9 kg)   BMI 25.71 kg/m  , BMI Body mass index is 25.71 kg/m. GEN: Well nourished, well developed, in no acute distress  HEENT: normal  Neck: no JVD, carotid bruits, or masses Cardiac: RRR; no murmurs, rubs, or gallops,no edema  Respiratory:  clear to auscultation bilaterally, normal work of breathing GI: soft, nontender, nondistended, + BS MS: no deformity or atrophy  Skin: warm and dry Neuro:  Strength and sensation are intact Psych: euthymic mood,  full affect  EKG:  EKG is not ordered today. Personal review of the ekg ordered 03/28/18 shows sinus rhythm, left axis deviation, septal infarct  Recent Labs: 10/13/2017: ALT 15; BUN 41; Creatinine, Ser 1.23; Hemoglobin 12.2; Platelets 201.0; Potassium 3.8;  Sodium 141; TSH 1.72    Lipid Panel     Component Value Date/Time   CHOL 225 (H) 10/13/2017 1431   TRIG 283.0 (H) 10/13/2017 1431   HDL 59.90 10/13/2017 1431   CHOLHDL 4 10/13/2017 1431   VLDL 56.6 (H) 10/13/2017 1431   LDLCALC 165 (H) 11/12/2015 1142   LDLDIRECT 133.0 10/13/2017 1431     Wt Readings from Last 3 Encounters:  06/06/18 149 lb 12.8 oz (67.9 kg)  04/16/18 152 lb (68.9 kg)  04/12/18 152 lb (68.9 kg)      Other studies Reviewed: Additional studies/ records that were reviewed today include: TTE 04/05/17, Cath 08/22/16, Telemetry 06/14/16  Review of the above records today demonstrates:  TTE - Left ventricle: The cavity size was normal. Wall thickness was   normal. Systolic function was mildly reduced. The estimated   ejection fraction was in the range of 45% to 50%. Diffuse   hypokinesis. Doppler parameters are consistent with abnormal left   ventricular relaxation (grade 1 diastolic dysfunction). Doppler   parameters are consistent with high ventricular filling pressure. - Aortic valve: There was mild stenosis. - Mitral valve: There was moderate regurgitation. - Left atrium: The atrium was mildly dilated. - Pulmonary arteries: Systolic pressure was mildly increased. PA   peak pressure: 40 mm Hg (S).   Cath  Prox LAD to Mid LAD lesion, 15 %stenosed.  Ost Cx to Prox Cx lesion, 15 %stenosed.  Prox RCA to Mid RCA lesion, 20 %stenosed.  There is mild left ventricular systolic dysfunction.  LV end diastolic pressure is normal.  The left ventricular ejection fraction is 45-50% by visual estimate.   1. Minor nonobstructive CAD 2. Mild LV dysfunction with distal inferior HK 3. Measurement of LV pressures  limited by frequent PVCs but EDP appears normal.   Telemetry 1. NSR with PVC's, at times in a bigeminal manner 2. Signal drop out as well as noise artifact are present.  3. No sustained atrial or ventricular arrhythmias 4. No pauses   ASSESSMENT AND PLAN:  1.  Paroxysmal atrial fibrillation: On Eliquis and Toprol-XL.  She was taken off of amiodarone previously.  She is having fatigue and I have instructed her to take the Toprol-XL at night.  This patients CHA2DS2-VASc Score and unadjusted Ischemic Stroke Rate (% per year) is equal to 7.2 % stroke rate/year from a score of 5  Above score calculated as 1 point each if present [CHF, HTN, DM, Vascular=MI/PAD/Aortic Plaque, Age if 65-74, or Female] Above score calculated as 2 points each if present [Age > 75, or Stroke/TIA/TE]  2. Chronic combined systolic HF: Currently on Toprol-XL.  No signs of volume overload.  She does titrate her Lasix at home.  3. HTN: Well-controlled.  No changes.  4. PVCs: Currently low burden.  No changes.   Current medicines are reviewed at length with the patient today.   The patient does not have concerns regarding her medicines.  The following changes were made today: None  Labs/ tests ordered today include:  No orders of the defined types were placed in this encounter.    Disposition:   FU with Pollyann Roa 6 months  Signed, Fabiha Rougeau Meredith Leeds, MD  06/06/2018 10:58 AM     [facility] [address] [place] [address] [phone] (office) [phone] (fax)

## 2018-06-06 ENCOUNTER — Encounter: Payer: Self-pay | Admitting: Cardiology

## 2018-06-06 ENCOUNTER — Ambulatory Visit (INDEPENDENT_AMBULATORY_CARE_PROVIDER_SITE_OTHER): Payer: Medicare Other | Admitting: Cardiology

## 2018-06-06 VITALS — BP 128/72 | HR 64 | Ht 64.0 in | Wt 149.8 lb

## 2018-06-06 DIAGNOSIS — I5022 Chronic systolic (congestive) heart failure: Secondary | ICD-10-CM | POA: Diagnosis not present

## 2018-06-06 DIAGNOSIS — I480 Paroxysmal atrial fibrillation: Secondary | ICD-10-CM

## 2018-06-06 DIAGNOSIS — I10 Essential (primary) hypertension: Secondary | ICD-10-CM

## 2018-06-06 DIAGNOSIS — I493 Ventricular premature depolarization: Secondary | ICD-10-CM

## 2018-06-06 NOTE — Patient Instructions (Signed)
Medication Instructions:  Your physician recommends that you continue on your current medications as directed. Please refer to the Current Medication list given to you today.  Labwork: None ordered     *We will only notify you of abnormal results, otherwise continue current treatment plan.  Testing/Procedures: None ordered  Follow-Up: Your physician wants you to follow-up in: 6 months  with Dr. Camnitz.  You will receive a reminder letter in the mail two months in advance. If you don't receive a letter, please call our office to schedule the follow-up appointment.   * If you need a refill on your cardiac medications before your next appointment, please call your pharmacy.   *Please note that any paperwork needing to be filled out by the provider will need to be addressed at the front desk prior to seeing the provider. Please note that any FMLA, disability or other documents regarding health condition is subject to a $25.00 charge that must be received prior to completion of paperwork in the form of a money order or check.  Thank you for choosing [facility]!!   Felix Pratt, RN [phone]      

## 2018-06-08 ENCOUNTER — Encounter: Payer: Self-pay | Admitting: Internal Medicine

## 2018-06-11 DIAGNOSIS — G8918 Other acute postprocedural pain: Secondary | ICD-10-CM | POA: Diagnosis not present

## 2018-06-11 DIAGNOSIS — M19041 Primary osteoarthritis, right hand: Secondary | ICD-10-CM | POA: Diagnosis not present

## 2018-06-15 ENCOUNTER — Encounter: Payer: Self-pay | Admitting: Pulmonary Disease

## 2018-06-15 ENCOUNTER — Encounter: Payer: Medicare Other | Admitting: Gynecology

## 2018-06-15 MED ORDER — RANITIDINE HCL 150 MG PO TABS: 150.0000 mg | ORAL_TABLET | Freq: Every day | ORAL | 1 refills | Status: DC

## 2018-06-21 DIAGNOSIS — M19041 Primary osteoarthritis, right hand: Secondary | ICD-10-CM | POA: Diagnosis not present

## 2018-06-22 ENCOUNTER — Encounter: Payer: Self-pay | Admitting: Pulmonary Disease

## 2018-06-23 ENCOUNTER — Encounter: Payer: Self-pay | Admitting: Pulmonary Disease

## 2018-06-29 ENCOUNTER — Encounter: Payer: Self-pay | Admitting: Internal Medicine

## 2018-06-29 MED ORDER — APIXABAN 5 MG PO TABS: 5.0000 mg | ORAL_TABLET | Freq: Two times a day (BID) | ORAL | 1 refills | Status: DC

## 2018-07-02 ENCOUNTER — Encounter: Payer: Self-pay | Admitting: Internal Medicine

## 2018-07-02 DIAGNOSIS — M19049 Primary osteoarthritis, unspecified hand: Secondary | ICD-10-CM

## 2018-07-03 MED ORDER — DICLOFENAC SODIUM 1 % TD GEL: 4.0000 g | Freq: Four times a day (QID) | TRANSDERMAL | 3 refills | Status: DC | PRN

## 2018-07-04 ENCOUNTER — Encounter: Payer: Self-pay | Admitting: Internal Medicine

## 2018-07-04 NOTE — Addendum Note (Signed)
Addended by: Biagio Borg on: 07/04/2018 12:04 PM   Modules accepted: Orders

## 2018-07-04 NOTE — Telephone Encounter (Signed)
pcc's to see pt concern about target of referral

## 2018-07-10 DIAGNOSIS — H04123 Dry eye syndrome of bilateral lacrimal glands: Secondary | ICD-10-CM | POA: Diagnosis not present

## 2018-07-10 DIAGNOSIS — H524 Presbyopia: Secondary | ICD-10-CM | POA: Diagnosis not present

## 2018-07-12 DIAGNOSIS — M79641 Pain in right hand: Secondary | ICD-10-CM | POA: Diagnosis not present

## 2018-07-13 ENCOUNTER — Ambulatory Visit (INDEPENDENT_AMBULATORY_CARE_PROVIDER_SITE_OTHER): Payer: Medicare Other | Admitting: Gynecology

## 2018-07-13 ENCOUNTER — Encounter: Payer: Self-pay | Admitting: Gynecology

## 2018-07-13 VITALS — BP 118/74 | Ht 64.0 in | Wt 149.0 lb

## 2018-07-13 DIAGNOSIS — M8580 Other specified disorders of bone density and structure, unspecified site: Secondary | ICD-10-CM

## 2018-07-13 DIAGNOSIS — Z9189 Other specified personal risk factors, not elsewhere classified: Secondary | ICD-10-CM | POA: Diagnosis not present

## 2018-07-13 DIAGNOSIS — Z01419 Encounter for gynecological examination (general) (routine) without abnormal findings: Secondary | ICD-10-CM | POA: Diagnosis not present

## 2018-07-13 DIAGNOSIS — N8111 Cystocele, midline: Secondary | ICD-10-CM

## 2018-07-13 DIAGNOSIS — N952 Postmenopausal atrophic vaginitis: Secondary | ICD-10-CM

## 2018-07-13 NOTE — Progress Notes (Signed)
    [name] 04-Dec-1941 [id number]        76 y.o.  [id number] for breast and pelvic exam.  Without gynecologic complaints.  Past medical history,surgical history, problem list, medications, allergies, family history and social history were all reviewed and documented as reviewed in the EPIC chart.  ROS:  Performed with pertinent positives and negatives included in the history, assessment and plan.   Additional significant findings : None   Exam: [name] assistant Vitals:   07/13/18 1224  BP: 118/74  Weight: 149 lb (67.6 kg)  Height: 5\' 4"  (1.626 m)   Body mass index is 25.58 kg/m.  General appearance:  Normal affect, orientation and appearance. Skin: Grossly normal HEENT: Without gross lesions.  No cervical or supraclavicular adenopathy. Thyroid normal.  Lungs:  Clear without wheezing, rales or rhonchi Cardiac: RR, without RMG Abdominal:  Soft, nontender, without masses, guarding, rebound, organomegaly or hernia Breasts:  Examined lying and sitting without masses, retractions, discharge or axillary adenopathy.  Left nipple dimpled as always Pelvic:  Ext, BUS, Vagina: With atrophic changes.  Minimal cystocele noted.  Cuff well supported  Adnexa: Without masses or tenderness    Anus and perineum: Normal   Rectovaginal: Normal sphincter tone without palpated masses or tenderness.    Assessment/Plan:  77 y.o. [id number] female for breast and pelvic exam.   1. Postmenopausal/atrophic genital changes.  Status post TVH A&P repair in the past.  Without significant menopausal symptoms. 2. Minimal cystocele.  Asymptomatic to the patient.  Stable on serial exams. 3. Mammography 02/2018.  Continue with annual mammography when due.  Breast exam normal today.  Left breast slightly more prominent than right breast.  Patient notes this has always been this way although seems a little more prominent now that she is put on weight.  We discussed that I think this is normal given that her  breasts are mostly fatty tissue.  She will continue with self breast exams and as long she feels no abnormalities will follow.  Left nipple also dimpled as has always been for years.  Reverts easily. 4. Colonoscopy 2014.  Repeat at their recommended interval. 5. Pap smear 2011.  No Pap smear done today.  No history of significant abnormal Pap smears.  We both agreed to stop screening based on age and hysterectomy history per current screening guidelines. 6. Osteopenia.  Last DEXA 2018.  Being followed through her primary physician's office. 7. Health maintenance.  No routine lab work done as patient does this elsewhere.  Follow-up 1 year, sooner as needed.   Anastasio Auerbach MD, 12:49 PM 07/13/2018

## 2018-07-13 NOTE — Patient Instructions (Signed)
Follow-up as needed for gynecologic issues

## 2018-07-17 ENCOUNTER — Encounter: Payer: Self-pay | Admitting: Sports Medicine

## 2018-07-17 ENCOUNTER — Ambulatory Visit (INDEPENDENT_AMBULATORY_CARE_PROVIDER_SITE_OTHER): Payer: Medicare Other | Admitting: Sports Medicine

## 2018-07-17 ENCOUNTER — Ambulatory Visit (INDEPENDENT_AMBULATORY_CARE_PROVIDER_SITE_OTHER): Payer: Medicare Other

## 2018-07-17 VITALS — BP 138/60 | HR 40 | Ht 64.0 in | Wt 148.4 lb

## 2018-07-17 DIAGNOSIS — M4807 Spinal stenosis, lumbosacral region: Secondary | ICD-10-CM

## 2018-07-17 DIAGNOSIS — M5441 Lumbago with sciatica, right side: Secondary | ICD-10-CM

## 2018-07-17 DIAGNOSIS — G8929 Other chronic pain: Secondary | ICD-10-CM

## 2018-07-17 DIAGNOSIS — M5442 Lumbago with sciatica, left side: Secondary | ICD-10-CM

## 2018-07-17 DIAGNOSIS — M25571 Pain in right ankle and joints of right foot: Secondary | ICD-10-CM

## 2018-07-17 DIAGNOSIS — R269 Unspecified abnormalities of gait and mobility: Secondary | ICD-10-CM | POA: Diagnosis not present

## 2018-07-17 DIAGNOSIS — M25561 Pain in right knee: Secondary | ICD-10-CM | POA: Diagnosis not present

## 2018-07-17 DIAGNOSIS — Z981 Arthrodesis status: Secondary | ICD-10-CM | POA: Diagnosis not present

## 2018-07-17 NOTE — Progress Notes (Signed)
Juanda Ray. Rigby, [name]  [name] - 77 y.o. female MRN [id number]  Date of birth: 1941/09/05  Visit Date: 07/17/2018  PCP: Biagio Borg, MD   Referred by: Biagio Borg, MD  Scribe(s) for today's visit: Josepha Pigg, CMA  SUBJECTIVE:  [name] is here for Follow-up (R leg pain, R hip weakness, abnormal gait)   12/15/2017: Compared to the last office visit, her previously described symptoms in hip are improving, she feels like she is getting stronger. She is still having pain in right ankle.   Current symptoms are moderate & are nonradiating She has been wearing her AirCast at all times unless she is in bed. She has also been wearing her Body Helix compression sleeve and feels that this has been beneficial. She has orthotic inserts in shoes and she is pleased with them.  She still has "twinge" in ankle when getting up at night to go to the bathroom and she has nothing on her feet.  Pt is ambulating with a cane.  01/26/2018: Compared to her last office visit on 12/15/17, her previously described R hip weakness and R post tib tendon dysfunction are improving/worsening/no change. Current symptoms are mild/mod/severe and radiating/non-radiating. She has been using her Body Helix ankle compression sleeve, wearing her AirCast and using her custom orthotics.  She has been doing her HEP.  02/19/2018: Compared to the last office visit on 01/26/18, her previously described R foot and hip pain symptoms show no change. Ankle is OK while walking but tender to touch. She reports improvement in hip pain. She continues to have swelling in the R foot. She reports that at times it feels like she is walking in water.  Current symptoms are mild & are nonradiating She has been wearing her Body Helix compression sleeve with some relief. She does have tenderness when walking without the compression sleeve. She had to stop  taking Meloxicam d/t med interaction with heart medication. She also has orthotics in her shoes and she feels that these are beneficial.   04/16/2018 Pt c/o pain and swelling of R knee. She fell early March (just prior to last OV) and pain started about 1 week later. She has tried using Voltaren and icing the knee with some relief. Pain is around the patella and swelling is mostly medial. She feels occasional sharp shooting pains in the knee. She is noticed increased warmth around the knee but denies erythema.  She reports improvement in ankle pain but continues to have pain and swelling in both feet, R>L. R foot pain is mostly on the medial aspect of the foot. Pain seems to be worse in the mornings and after being sedentary. She feels that her orthotics have been beneficial since having then adjusted at her last visit.  R hip pain/weakness is the same. She was discharged from PT about 1 month ago. She continues to ambulate with a cane.   07/17/2018: R knee She reports that R knee pain has improved, not currently an issue.  She has not been taking Colchicine or using Voltaren gel.  XR R knee 04/16/2018  R hip She reports not change with weakness in R hip. She is still ambulating with a cane. She has done months of rehab in the past. She I reports having to use her cane more often at home now. She denies recent fall.  CT L-spine 02/16/2015.   B foot pain, R>L  She has been using CBD and feels that it has been beneficial for her foot pain. Since she started using CBD, she reports that she is now able to walk with more ease. Pain is still worse in the morning or after being sedentary.  XR R foot 08/28/2017  R ankle She reports that ankle pain has improved, this is currently not an issue.  MR R ankle 11/09/17  She has been seeing Dr. Burney Gauze for bilateral hand pain. She has been using CBD with some relief.  PCP referred her to a rheumatologist, Dr. Trudie Reed.  She c/o continued fatigue.  Currently she  is taking Tylenol arthritis, Gabapentin, CBD oil, and Tylenol PM at bedtime.    REVIEW OF SYSTEMS: Reports night time disturbances, pt reports RLS. Denies fevers, chills, or night sweats. Denies unexplained weight loss. Reports personal history of cancer. Denies changes in bowel or bladder habits. Denies recent unreported falls. Denies new or worsening dyspnea or wheezing. Denies headaches or dizziness.  Reports  weakness in R hip.  Denies dizziness or presyncopal episodes Reports lower extremity edema - Lasix daily.    HISTORY & PERTINENT PRIOR DATA:  Prior History reviewed and updated per electronic medical record.  Significant/pertinent history, findings, studies include:  reports that she quit smoking about 22 years ago. Her smoking use included cigarettes. She started smoking about 63 years ago. She has a 126.00 pack-year smoking history. She has never used smokeless tobacco. Recent Labs    10/13/17 1431  HGBA1C 5.8   No specialty comments available. No problems updated.  OBJECTIVE:  VS:  HT:5\' 4"  (162.6 cm)   WT:148 lb 6.4 oz (67.3 kg)  BMI:25.46    BP:138/60  HR:(Abnormal) 40bpm  TEMP: ( )  RESP:95 %   PHYSICAL EXAM: Constitutional: WDWN, Non-toxic appearing. Psychiatric: Alert & appropriately interactive.  Not depressed or anxious appearing. Respiratory: No increased work of breathing.  Trachea Midline Eyes: Pupils are equal.  EOM intact without nystagmus.  No scleral icterus  Vascular Exam: warm to touch no edema  lower extremity neuro exam: She walks with a wide-based antalgic gait.  Uses a cane.  Lower extremity sensation is intact.  Negative straight leg raise.  Dactylitis of the majority of her fingers as well as bossing of the PIPs bilaterally that is significantly worse than the last visits.  Midfoot bossing.   PROCEDURES & DATA REVIEWED:    ASSESSMENT & PLAN:   1. Right knee pain, unspecified chronicity   2. Spinal stenosis of lumbosacral  region   3. Gait disturbance   4. Chronic pain of right ankle   5. Chronic bilateral low back pain with bilateral sciatica     PLAN: She has multiple issues that are going on at this point and will benefit from her up coming appointment with Dr. Trudie Reed.   She is describing some symptoms of fairly classic lumbar spinal stenosis with worsening leg pain and improvement with ambulation using a shopping cart.  She has had a same issue with lumbar spondylosis and I would like to update her x-ray today and set her up for a repeat MRI.  Discussing referral to Dr. Ernestina Patches for epidural steroid injections if appropriate but given the Eliquis that she is on empiric injection at this point is less likely to be helpful.  She has been using CBD oil for her hands feet and reports good improvement with this as well as sublingual.  I discussed I cannot specifically recommend this at this time but given  the improvement she is having the otherwise relatively safe with no significant contraindications at this point it does seem to be providing her some benefit until further information is obtained around CBD further recommendations are unable to be given.  I like to see how she is after her upcoming appointments as well as after obtaining further diagnostic evaluation with x-rays and further advanced diagnostic testing need to be obtaining these over the next several weeks and be looking for her incoming notes.  Follow-up: Return if symptoms worsen or fail to improve. We will call you with your MRI results. .      Please see additional documentation for Objective, Assessment and Plan sections. Pertinent additional documentation may be included in corresponding procedure notes, imaging studies, problem based documentation and patient instructions. Please see these sections of the encounter for additional information regarding this visit.  CMA/ATC served as Education administrator during this visit. History, Physical, and Plan performed  by medical provider. Documentation and orders reviewed and attested to.      Gerda Diss, [place] Physician

## 2018-07-17 NOTE — Patient Instructions (Signed)

## 2018-07-20 ENCOUNTER — Encounter (INDEPENDENT_AMBULATORY_CARE_PROVIDER_SITE_OTHER): Payer: Self-pay | Admitting: Physical Medicine and Rehabilitation

## 2018-07-24 ENCOUNTER — Ambulatory Visit
Admission: RE | Admit: 2018-07-24 | Discharge: 2018-07-24 | Disposition: A | Discharge status: Home / Self Care | Payer: Medicare Other | Source: Ambulatory Visit | Attending: Sports Medicine | Admitting: Sports Medicine

## 2018-07-24 DIAGNOSIS — M4807 Spinal stenosis, lumbosacral region: Secondary | ICD-10-CM

## 2018-07-24 DIAGNOSIS — M5127 Other intervertebral disc displacement, lumbosacral region: Secondary | ICD-10-CM | POA: Diagnosis not present

## 2018-07-26 NOTE — Progress Notes (Addendum)
Subjective:   [name] is a 77 y.o. female who presents for Medicare Annual (Subsequent) preventive examination.  Review of Systems:  No ROS.  Medicare Wellness Visit. Additional risk factors are reflected in the social history.  Cardiac Risk Factors include: advanced age (>18men, >59 women);dyslipidemia;hypertension Sleep patterns: has difficulty falling asleep, has frequent nighttime awakenings, gets up 2 times nightly to void and sleeps 5 hours nightly.    Home Safety/Smoke Alarms: Feels safe in home. Smoke alarms in place.  Living environment; residence and Firearm Safety: 1-story house/ trailer, equipment: Radio producer, Type: [place], no firearms. Lives with husband, no needs for DME, good support system Seat Belt Safety/Bike Helmet: Wears seat belt.     Objective:     Vitals: BP 112/62   Pulse 70   Resp 18   Ht 5\' 4"  (1.626 m)   Wt 148 lb (67.1 kg)   SpO2 98%   BMI 25.40 kg/m   Body mass index is 25.4 kg/m.  Advanced Directives 07/27/2018 12/20/2016 10/11/2016 09/26/2016 08/23/2016 08/10/2016 07/27/2016  Does Patient Have a Medical Advance Directive? Yes No No No No No No  Type of Paramedic of [place];Living will - - - - - -  Copy of [place] in Chart? No - copy requested - - - - - -  Would patient like information on creating a medical advance directive? - No - Patient declined - Yes - Educational materials given Yes - Scientist, clinical (histocompatibility and immunogenetics) given - No - patient declined information  Pre-existing out of facility DNR order (yellow form or pink MOST form) - - - - - - -    Tobacco Social History   Tobacco Use  Smoking Status Former Smoker  . Packs/day: 3.00  . Years: 42.00  . Pack years: 126.00  . Types: Cigarettes  . Start date: 08/04/1954  . Last attempt to quit: 12/06/1995  . Years since quitting: 22.6  Smokeless Tobacco Never Used     Counseling given: Not Answered   Past Medical History:  Diagnosis Date  .  Allergic rhinitis   . Anxiety   . Atrial septal aneurysm   . Basal cell carcinoma   . Bradycardia   . Cataract    bil cataracts removed  . Chronic combined systolic and diastolic CHF (congestive heart failure) ([place])   . CKD (chronic kidney disease), stage III ([place])   . Clostridium difficile infection   . COPD (chronic obstructive pulmonary disease) ([place])   . Depression 11/29/2013  . DI (detrusor instability)   . Esophageal stricture   . GERD (gastroesophageal reflux disease)   . Hemorrhoids   . Hiatal hernia   . History of kidney stones   . HLD (hyperlipidemia)   . HTN (hypertension)   . Ischemic colon ([place])    a. 08/2016 - adm to  [facility] with a ruptured colon s/p right hemicolectomy with ileostomy formation for ischemic and necrotic colon.   . Lung nodule    a. followed by pulmonology  . Mild CAD    a. minor nonobstructive CAD 08/22/16.  . Nephrolithiasis    hx  . NICM (nonischemic cardiomyopathy) ([place])   . Orthostatic hypotension   . Osteoarthritis   . PAF (paroxysmal atrial fibrillation) ([place])    a. diagnosed 07/2016 s/p DCCV 08/01/16 with recurrence, managed with rate control for now.   . Pneumonia 2013  . PVC's (premature ventricular contractions)   . Shingles since Nov 18, 2013  on right arm from shoulder to wrist  . Status post dilation of esophageal narrowing   . Stroke [facility]) 2001   STROKES, TIA'S  . Syncope    a. 08/2016 felt due to orthostatic hypotension.  Marland Kitchen TIA (transient ischemic attack) last 2001   anticoagulation therapy on coumadin   Past Surgical History:  Procedure Laterality Date  . ANTERIOR AND POSTERIOR VAGINAL REPAIR  2008   A&P REPAIR-DR MCDIARMID  . ANTERIOR LATERAL LUMBAR FUSION 4 LEVELS Right 07/03/2015   Procedure: Right Lumbar one-two, Lumbar two-three, Lumbar three-four, Lumbar four-five Anterior lateral lumbar interbody fusion;  Surgeon: Erline Levine, MD;  Location: [facility];  Service: Neurosurgery;  Laterality: Right;  Right  L1-2 L2-3 L3-4 L4-5 Anterior lateral lumbar interbody fusion  . BALLOON DILATION N/A 03/03/2014   Procedure: BALLOON DILATION;  Surgeon: Inda Castle, MD;  Location: [facility] END[facility];  Service: Endoscopy;  Laterality: N/A;  . BLADDER SUSPENSION    . BREAST BIOPSY    . CARDIAC CATHETERIZATION N/A 08/22/2016   Procedure: Left Heart Cath and Coronary Angiography;  Surgeon: Peter M Martinique, MD;  Location: [facility] CV LAB;  Service: Cardiovascular;  Laterality: N/A;  . CARDIOVERSION N/A 08/01/2016   Procedure: CARDIOVERSION;  Surgeon: Jerline Pain, MD;  Location: [facility];  Service: Cardiovascular;  Laterality: N/A;  . CARDIOVERSION N/A 10/11/2016   Procedure: CARDIOVERSION;  Surgeon: Sueanne Margarita, MD;  Location: [facility];  Service: Cardiovascular;  Laterality: N/A;  . COLONOSCOPY    . ESOPHAGOGASTRODUODENOSCOPY N/A 03/03/2014   Procedure: ESOPHAGOGASTRODUODENOSCOPY (EGD);  Surgeon: Inda Castle, MD;  Location: [facility];  Service: Endoscopy;  Laterality: N/A;  . EXCISION OF BASAL CELL CA  2011  . FINGER SURGERY    . GLUTEUS MINIMUS REPAIR Right 03/15/2016   Procedure: RIGHT HIP GLUTEUS MEDIUS TENDON REPAIR;  Surgeon: Paralee Cancel, MD;  Location: [facility];  Service: Orthopedics;  Laterality: Right;  . ileostomy reversal    . LUMBAR PERCUTANEOUS PEDICLE SCREW 4 LEVEL Right 07/03/2015   Procedure: Lumbar one-Sacral one Bilateral percutaneous pedicle screws;  Surgeon: Erline Levine, MD;  Location: [facility];  Service: Neurosurgery;  Laterality: Right;  L1-S1 Bilateral percutaneous pedicle screws  . RECTOCELE REPAIR  2008   A&P REPAIR -DR. MCDIARMID  . THUMB SURGERY Right 10-15 yrs ago  . UPPER GASTROINTESTINAL ENDOSCOPY    . VAGINAL HYSTERECTOMY  1983   NO BSO-DR. MABRY  . VARICOSE VEIN SURGERY     Family History  Problem Relation Age of Onset  . COPD Mother   . Breast cancer Mother        mid 50's  . Uterine cancer Mother   . Emphysema Mother   . Breast cancer Maternal Aunt          x 3 aunts, post menopausal  . Breast cancer Maternal Aunt   . Breast cancer Maternal Aunt   . Colon cancer Neg Hx   . Esophageal cancer Neg Hx   . Rectal cancer Neg Hx   . Stomach cancer Neg Hx   . Pancreatic cancer Neg Hx    Social History   Socioeconomic History  . Marital status: Married    Spouse name: Shanon Brow  . Number of children: 2  . Years of education: Not on file  . Highest education level: Not on file  Occupational History  . Occupation: retired    Fish farm manager: RETIRED  Social Needs  . Financial resource strain: Not hard at all  . Food insecurity:  Worry: Never true    Inability: Never true  . Transportation needs:    Medical: No    Non-medical: No  Tobacco Use  . Smoking status: Former Smoker    Packs/day: 3.00    Years: 42.00    Pack years: 126.00    Types: Cigarettes    Start date: 08/04/1954    Last attempt to quit: 12/06/1995    Years since quitting: 22.6  . Smokeless tobacco: Never Used  Substance and Sexual Activity  . Alcohol use: No    Alcohol/week: 0.0 standard drinks  . Drug use: No  . Sexual activity: Not Currently    Birth control/protection: Surgical    Comment: 1st intercourse 77 yo-Fewer than 5 partners  Lifestyle  . Physical activity:    Days per week: 0 days    Minutes per session: 0 min  . Stress: Only a little  Relationships  . Social connections:    Talks on phone: More than three times a week    Gets together: Not on file    Attends religious service: 1 to 4 times per year    Active member of club or organization: Not on file    Attends meetings of clubs or organizations: Not on file    Relationship status: Married  Other Topics Concern  . Not on file  Social History Narrative   Retired- Dentist Pulmonary:   Originally from [place]. Has always lived in [place]. She has traveled to [place], [place], [place], [place], [place], [place]. Has been on multiple cruises. 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 She did use a face mask consistently to avoid chemical fume exposure. She may have only had an inhaled exposure a couple of times. She currently has a dog. Remote exposure to a parakeet. No mold or hot tub exposure. No asbestos exposure.     Outpatient Encounter Medications as of 07/27/2018  Medication Sig  . albuterol (PROVENTIL HFA;VENTOLIN HFA) 108 (90 Base) MCG/ACT inhaler Inhale 1-2 puffs into the lungs every 6 (six) hours as needed for wheezing or shortness of breath.  Marland Kitchen apixaban (ELIQUIS) 5 MG TABS tablet Take 1 tablet (5 mg total) by mouth 2 (two) times daily.  Marland Kitchen azelastine (ASTELIN) 0.1 % nasal spray Place 2 sprays into the nose daily. Use in each nostril as directed  . benzonatate (TESSALON PERLES) 100 MG capsule Take 1 capsule (100 mg total) 3 (three) times daily as needed by mouth for cough.  Marland Kitchen buPROPion (WELLBUTRIN XL) 300 MG 24 hr tablet Take 1 tablet (300 mg total) by mouth daily.  . Calcium Carbonate-Vitamin D (CALCIUM + D PO) Take 1 tablet by mouth 2 (two) times daily.   . colchicine 0.6 MG tablet Take 1 tablet (0.6 mg total) by mouth daily as needed.  . cyclobenzaprine (FLEXERIL) 5 MG tablet Take 1 tablet (5 mg total) by mouth 3 (three) times daily as needed for muscle spasms.  . diclofenac sodium (VOLTAREN) 1 % GEL Apply 4 g topically 4 (four) times daily as needed.  . fluticasone (FLONASE) 50 MCG/ACT nasal spray Place 1 spray into both nostrils 2 (two) times daily.  . Fluticasone-Umeclidin-Vilant (TRELEGY ELLIPTA) 100-62.5-25 MCG/INH AEPB Inhale into the lungs. I inhalation once daily  . furosemide (LASIX) 20 MG tablet Take 1 tablet (20 mg total) by mouth as needed (swelling).  . gabapentin (NEURONTIN) 300 MG capsule 1 tab  by mouth in AM, 1 po at midday, then 2 by mouth at bedtime for sleep and pain  . guaiFENesin (MUCINEX) 600 MG 12 hr tablet Take 1,200 mg by mouth 2 (two) times daily as  needed for cough or to loosen phlegm.  Marland Kitchen ipratropium (ATROVENT) 0.03 % nasal spray Place 2 sprays into both nostrils 3 (three) times daily as needed for rhinitis.  Marland Kitchen levothyroxine (SYNTHROID, LEVOTHROID) 25 MCG tablet Take 1 tablet (25 mcg total) by mouth daily before breakfast.  . Lidocaine HCl 2 % CREA Apply 1 application topically daily as needed (for shingles flares).  . Melatonin 5 MG TABS Take 5 mg by mouth at bedtime.  . metoprolol succinate (TOPROL-XL) 100 MG 24 hr tablet Take 1 tablet (100 mg total) by mouth daily. Take with or immediately following a meal.  . mirabegron ER (MYRBETRIQ) 50 MG TB24 tablet Take 50 mg by mouth daily.  . montelukast (SINGULAIR) 10 MG tablet Take 1 tablet (10 mg total) by mouth daily.  . Multiple Vitamin (MULTIVITAMIN) tablet Take 1 tablet by mouth daily.    Marland Kitchen nystatin (MYCOSTATIN) 100000 UNIT/ML suspension Take 5 cc by mouth, swish and swallow 4 times daily.  Marland Kitchen omeprazole (PRILOSEC) 40 MG capsule Take 1 capsule (40 mg total) by mouth daily.  . Probiotic Product (PROBIOTIC DAILY PO) Take by mouth.  . ranitidine (ZANTAC) 150 MG tablet Take 1 tablet (150 mg total) by mouth at bedtime.  Marland Kitchen Respiratory Therapy Supplies (FLUTTER) DEVI Use 2-3 times per day  . traZODone (DESYREL) 50 MG tablet Take 0.5-1 tablets (25-50 mg total) by mouth at bedtime as needed for sleep.  Marland Kitchen ezetimibe (ZETIA) 10 MG tablet Take 1 tablet (10 mg total) daily by mouth.   No facility-administered encounter medications on file as of 07/27/2018.     Activities of Daily Living In your present state of health, do you have any difficulty performing the following activities: 07/27/2018  Hearing? N  Vision? N  Difficulty concentrating or making decisions? N  Walking or climbing stairs? N  Dressing or bathing? N  Doing errands, shopping? N  Preparing Food and eating ? N  Using the Toilet? N  In the past six months, have you accidently leaked urine? Y  Comment followed by urology  Do you  have problems with loss of bowel control? N  Managing your Medications? N  Managing your Finances? N  Housekeeping or managing your Housekeeping? N  Some recent data might be hidden    Patient Care Team: Biagio Borg, MD as PCP - General Curt Bears, Ocie Doyne, MD as PCP - Electrophysiology (Cardiology) Irene Shipper, MD as Consulting Physician (Gastroenterology) Governor Rooks., DO as Referring Physician (Surgery) Fontaine, Belinda Block, MD as Consulting Physician (Gynecology) Lynnell Dike, OD (Optometry)    Assessment:   This is a routine wellness examination for [place]. Physical assessment deferred to PCP.   Exercise Activities and Dietary recommendations Current Exercise Habits: The patient does not participate in regular exercise at present, Exercise limited by: orthopedic condition(s)  Diet (meal preparation, eat out, water intake, caffeinated beverages, dairy products, fruits and vegetables): in general, a "healthy" diet   Reports poor appetite.   Reviewed heart healthy diet. Encouraged patient to increase daily water and healthy fluid intake. Discussed supplementing with ensure, samples and coupons provided.   Goals      Patient Stated   . <enter goal here> (pt-stated)     "to get well again"  Other   . Patient Stated     Increase my physical activity by doing exercises to increase strength in my arms and legs.        Fall Risk Fall Risk  07/27/2018 10/13/2017 04/12/2017 12/20/2016 09/26/2016  Falls in the past year? Yes No Yes Yes Yes  Number falls in past yr: 1 - 2 or more 1 1  Comment - - known cardiac related, - -  Injury with Fall? No - No No No  Risk for fall due to : Impaired mobility;Impaired balance/gait - - - -  Follow up Falls prevention discussed - - Falls prevention discussed Falls prevention discussed    Depression Screen PHQ 2/9 Scores 07/27/2018 10/13/2017 04/12/2017 12/20/2016  PHQ - 2 Score 1 0 1 6  PHQ- 9 Score 9 0 - 12     Cognitive  Function MMSE - Mini Mental State Exam 07/27/2018  Orientation to time 5  Orientation to Place 5  Registration 3  Attention/ Calculation 5  Recall 2  Language- name 2 objects 2  Language- repeat 1  Language- follow 3 step command 3  Language- read & follow direction 1  Write a sentence 1  Copy design 1  Total score 29        Immunization History  Administered Date(s) Administered  . Influenza Split 09/11/2012  . Influenza Whole 09/15/2006, 08/04/2010, 08/11/2011  . Influenza, High Dose Seasonal PF 08/22/2017  . Influenza,inj,Quad PF,6+ Mos 09/02/2013, 08/26/2014, 08/19/2015, 08/23/2016  . Pneumococcal Conjugate-13 11/29/2013  . Pneumococcal Polysaccharide-23 12/06/2003, 12/05/2006  . Td 12/05/2004  . Zoster 07/07/2008  . Zoster Recombinat (Shingrix) 04/12/2017, 06/19/2017   Screening Tests Health Maintenance  Topic Date Due  . INFLUENZA VACCINE  07/05/2018  . TETANUS/TDAP  06/04/2026  . DEXA SCAN  Completed  . PNA vac Low Risk Adult  Completed  . PAP SMEAR  Discontinued      Plan:      Continue doing brain stimulating activities (puzzles, reading, adult coloring books, staying active) to keep memory sharp.   Continue to eat heart healthy diet (full of fruits, vegetables, whole grains, lean protein, water--limit salt, fat, and sugar intake) and increase physical activity as tolerated.  I have personally reviewed and noted the following in the patient's chart:   . Medical and social history . Use of alcohol, tobacco or illicit drugs  . Current medications and supplements . Functional ability and status . Nutritional status . Physical activity . Advanced directives . List of other physicians . Vitals . Screenings to include cognitive, depression, and falls . Referrals and appointments  In addition, I have reviewed and discussed with patient certain preventive protocols, quality metrics, and best practice recommendations. A written personalized care plan for  preventive services as well as general preventive health recommendations were provided to patient.     Michiel Cowboy, RN  07/27/2018    Medical screening examination/treatment/procedure(s) were performed by non-physician practitioner and as supervising physician I was immediately available for consultation/collaboration. I agree with above. Cathlean Cower, MD

## 2018-07-27 ENCOUNTER — Ambulatory Visit (INDEPENDENT_AMBULATORY_CARE_PROVIDER_SITE_OTHER): Payer: Medicare Other | Admitting: *Deleted

## 2018-07-27 ENCOUNTER — Telehealth: Payer: Self-pay | Admitting: *Deleted

## 2018-07-27 VITALS — BP 112/62 | HR 70 | Resp 18 | Ht 64.0 in | Wt 148.0 lb

## 2018-07-27 DIAGNOSIS — Z0000 Encounter for general adult medical examination without abnormal findings: Secondary | ICD-10-CM

## 2018-07-27 MED ORDER — NYSTATIN 100000 UNIT/ML MT SUSP: OROMUCOSAL | 0 refills | Status: DC

## 2018-07-27 NOTE — Telephone Encounter (Signed)
During AWV, patient stated that she takes melatonin, trazodone, and gabapentin nightly and this is not effectively helping her with her insomnia. Also, patient asked if she could get a refill for nystatin, chronic use of inhalers often causes her to develop thrush.

## 2018-07-27 NOTE — Patient Instructions (Addendum)
Continue doing brain stimulating activities (puzzles, reading, adult coloring books, staying active) to keep memory sharp.   Continue to eat heart healthy diet (full of fruits, vegetables, whole grains, lean protein, water--limit salt, fat, and sugar intake) and increase physical activity as tolerated.   [name] , Thank you for taking time to come for your Medicare Wellness Visit. I appreciate your ongoing commitment to your health goals. Please review the following plan we discussed and let me know if I can assist you in the future.   These are the goals we discussed: Goals      Patient Stated   . <enter goal here> (pt-stated)     "to get well again"      Other   . Patient Stated     Increase my physical activity by doing exercises to increase strength in my arms and legs.        This is a list of the screening recommended for you and due dates:  Health Maintenance  Topic Date Due  . Pap Smear  08/25/2012  . Flu Shot  07/05/2018  . Tetanus Vaccine  06/04/2026  . DEXA scan (bone density measurement)  Completed  . Pneumonia vaccines  Completed   Health Maintenance, Female Adopting a healthy lifestyle and getting preventive care can go a long way to promote health and wellness. Talk with your health care provider about what schedule of regular examinations is right for you. This is a good chance for you to check in with your provider about disease prevention and staying healthy. In between checkups, there are plenty of things you can do on your own. Experts have done a lot of research about which lifestyle changes and preventive measures are most likely to keep you healthy. Ask your health care provider for more information. Weight and diet Eat a healthy diet  Be sure to include plenty of vegetables, fruits, low-fat dairy products, and lean protein.  Do not eat a lot of foods high in solid fats, added sugars, or salt.  Get regular exercise. This is one of the most important  things you can do for your health. ? Most adults should exercise for at least 150 minutes each week. The exercise should increase your heart rate and make you sweat (moderate-intensity exercise). ? Most adults should also do strengthening exercises at least twice a week. This is in addition to the moderate-intensity exercise.  Maintain a healthy weight  Body mass index (BMI) is a measurement that can be used to identify possible weight problems. It estimates body fat based on height and weight. Your health care provider can help determine your BMI and help you achieve or maintain a healthy weight.  For females 21 years of age and older: ? A BMI below 18.5 is considered underweight. ? A BMI of 18.5 to 24.9 is normal. ? A BMI of 25 to 29.9 is considered overweight. ? A BMI of 30 and above is considered obese.  Watch levels of cholesterol and blood lipids  You should start having your blood tested for lipids and cholesterol at 79 years of age, then have this test every 5 years.  You may need to have your cholesterol levels checked more often if: ? Your lipid or cholesterol levels are high. ? You are older than 79 years of age. ? You are at high risk for heart disease.  Cancer screening Lung Cancer  Lung cancer screening is recommended for adults 4-66 years old who are at high  risk for lung cancer because of a history of smoking.  A yearly low-dose CT scan of the lungs is recommended for people who: ? Currently smoke. ? Have quit within the past 15 years. ? Have at least a 30-pack-year history of smoking. A pack year is smoking an average of one pack of cigarettes a day for 1 year.  Yearly screening should continue until it has been 15 years since you quit.  Yearly screening should stop if you develop a health problem that would prevent you from having lung cancer treatment.  Breast Cancer  Practice breast self-awareness. This means understanding how your breasts normally appear  and feel.  It also means doing regular breast self-exams. Let your health care provider know about any changes, no matter how small.  If you are in your 20s or 30s, you should have a clinical breast exam (CBE) by a health care provider every 1-3 years as part of a regular health exam.  If you are 74 or older, have a CBE every year. Also consider having a breast X-ray (mammogram) every year.  If you have a family history of breast cancer, talk to your health care provider about genetic screening.  If you are at high risk for breast cancer, talk to your health care provider about having an MRI and a mammogram every year.  Breast cancer gene (BRCA) assessment is recommended for women who have family members with BRCA-related cancers. BRCA-related cancers include: ? Breast. ? Ovarian. ? Tubal. ? Peritoneal cancers.  Results of the assessment will determine the need for genetic counseling and BRCA1 and BRCA2 testing.  Cervical Cancer Your health care provider may recommend that you be screened regularly for cancer of the pelvic organs (ovaries, uterus, and vagina). This screening involves a pelvic examination, including checking for microscopic changes to the surface of your cervix (Pap test). You may be encouraged to have this screening done every 3 years, beginning at age 51.  For women ages 9-65, health care providers may recommend pelvic exams and Pap testing every 3 years, or they may recommend the Pap and pelvic exam, combined with testing for human papilloma virus (HPV), every 5 years. Some types of HPV increase your risk of cervical cancer. Testing for HPV may also be done on women of any age with unclear Pap test results.  Other health care providers may not recommend any screening for nonpregnant women who are considered low risk for pelvic cancer and who do not have symptoms. Ask your health care provider if a screening pelvic exam is right for you.  If you have had past treatment  for cervical cancer or a condition that could lead to cancer, you need Pap tests and screening for cancer for at least 20 years after your treatment. If Pap tests have been discontinued, your risk factors (such as having a new sexual partner) need to be reassessed to determine if screening should resume. Some women have medical problems that increase the chance of getting cervical cancer. In these cases, your health care provider may recommend more frequent screening and Pap tests.  Colorectal Cancer  This type of cancer can be detected and often prevented.  Routine colorectal cancer screening usually begins at 79 years of age and continues through 79 years of age.  Your health care provider may recommend screening at an earlier age if you have risk factors for colon cancer.  Your health care provider may also recommend using home test kits to check for hidden  blood in the stool.  A small camera at the end of a tube can be used to examine your colon directly (sigmoidoscopy or colonoscopy). This is done to check for the earliest forms of colorectal cancer.  Routine screening usually begins at age 71.  Direct examination of the colon should be repeated every 5-10 years through 79 years of age. However, you may need to be screened more often if early forms of precancerous polyps or small growths are found.  Skin Cancer  Check your skin from head to toe regularly.  Tell your health care provider about any new moles or changes in moles, especially if there is a change in a mole's shape or color.  Also tell your health care provider if you have a mole that is larger than the size of a pencil eraser.  Always use sunscreen. Apply sunscreen liberally and repeatedly throughout the day.  Protect yourself by wearing long sleeves, pants, a wide-brimmed hat, and sunglasses whenever you are outside.  Heart disease, diabetes, and high blood pressure  High blood pressure causes heart disease and  increases the risk of stroke. High blood pressure is more likely to develop in: ? People who have blood pressure in the high end of the normal range (130-139/85-89 mm Hg). ? People who are overweight or obese. ? People who are African American.  If you are 62-74 years of age, have your blood pressure checked every 3-5 years. If you are 66 years of age or older, have your blood pressure checked every year. You should have your blood pressure measured twice-once when you are at a hospital or clinic, and once when you are not at a hospital or clinic. Record the average of the two measurements. To check your blood pressure when you are not at a hospital or clinic, you can use: ? An automated blood pressure machine at a pharmacy. ? A home blood pressure monitor.  If you are between 63 years and 64 years old, ask your health care provider if you should take aspirin to prevent strokes.  Have regular diabetes screenings. This involves taking a blood sample to check your fasting blood sugar level. ? If you are at a normal weight and have a low risk for diabetes, have this test once every three years after 79 years of age. ? If you are overweight and have a high risk for diabetes, consider being tested at a younger age or more often. Preventing infection Hepatitis B  If you have a higher risk for hepatitis B, you should be screened for this virus. You are considered at high risk for hepatitis B if: ? You were born in a country where hepatitis B is common. Ask your health care provider which countries are considered high risk. ? Your parents were born in a high-risk country, and you have not been immunized against hepatitis B (hepatitis B vaccine). ? You have HIV or AIDS. ? You use needles to inject street drugs. ? You live with someone who has hepatitis B. ? You have had sex with someone who has hepatitis B. ? You get hemodialysis treatment. ? You take certain medicines for conditions, including  cancer, organ transplantation, and autoimmune conditions.  Hepatitis C  Blood testing is recommended for: ? Everyone born from [birth year] through 1967. ? Anyone with known risk factors for hepatitis C.  Sexually transmitted infections (STIs)  You should be screened for sexually transmitted infections (STIs) including gonorrhea and chlamydia if: ? You are sexually active  and are younger than 79 years of age. ? You are older than 79 years of age and your health care provider tells you that you are at risk for this type of infection. ? Your sexual activity has changed since you were last screened and you are at an increased risk for chlamydia or gonorrhea. Ask your health care provider if you are at risk.  If you do not have HIV, but are at risk, it may be recommended that you take a prescription medicine daily to prevent HIV infection. This is called pre-exposure prophylaxis (PrEP). You are considered at risk if: ? You are sexually active and do not regularly use condoms or know the HIV status of your partner(s). ? You take drugs by injection. ? You are sexually active with a partner who has HIV.  Talk with your health care provider about whether you are at high risk of being infected with HIV. If you choose to begin PrEP, you should first be tested for HIV. You should then be tested every 3 months for as long as you are taking PrEP. Pregnancy  If you are premenopausal and you may become pregnant, ask your health care provider about preconception counseling.  If you may become pregnant, take 400 to 800 micrograms (mcg) of folic acid every day.  If you want to prevent pregnancy, talk to your health care provider about birth control (contraception). Osteoporosis and menopause  Osteoporosis is a disease in which the bones lose minerals and strength with aging. This can result in serious bone fractures. Your risk for osteoporosis can be identified using a bone density scan.  If you are 68 years  of age or older, or if you are at risk for osteoporosis and fractures, ask your health care provider if you should be screened.  Ask your health care provider whether you should take a calcium or vitamin D supplement to lower your risk for osteoporosis.  Menopause may have certain physical symptoms and risks.  Hormone replacement therapy may reduce some of these symptoms and risks. Talk to your health care provider about whether hormone replacement therapy is right for you. Follow these instructions at home:  Schedule regular health, dental, and eye exams.  Stay current with your immunizations.  Do not use any tobacco products including cigarettes, chewing tobacco, or electronic cigarettes.  If you are pregnant, do not drink alcohol.  If you are breastfeeding, limit how much and how often you drink alcohol.  Limit alcohol intake to no more than 1 drink per day for nonpregnant women. One drink equals 12 ounces of beer, 5 ounces of wine, or 1 ounces of hard liquor.  Do not use street drugs.  Do not share needles.  Ask your health care provider for help if you need support or information about quitting drugs.  Tell your health care provider if you often feel depressed.  Tell your health care provider if you have ever been abused or do not feel safe at home. This information is not intended to replace advice given to you by your health care provider. Make sure you discuss any questions you have with your health care provider. Document Released: 06/06/2011 Document Revised: 04/28/2016 Document Reviewed: 08/25/2015 Elsevier Interactive Patient Education  [employer].

## 2018-08-07 ENCOUNTER — Ambulatory Visit (INDEPENDENT_AMBULATORY_CARE_PROVIDER_SITE_OTHER): Payer: Medicare Other | Admitting: Physical Medicine and Rehabilitation

## 2018-08-07 ENCOUNTER — Encounter (INDEPENDENT_AMBULATORY_CARE_PROVIDER_SITE_OTHER): Payer: Self-pay | Admitting: Physical Medicine and Rehabilitation

## 2018-08-07 VITALS — BP 120/68 | HR 75

## 2018-08-07 DIAGNOSIS — M961 Postlaminectomy syndrome, not elsewhere classified: Secondary | ICD-10-CM

## 2018-08-07 DIAGNOSIS — Z7901 Long term (current) use of anticoagulants: Secondary | ICD-10-CM

## 2018-08-07 DIAGNOSIS — Z981 Arthrodesis status: Secondary | ICD-10-CM | POA: Diagnosis not present

## 2018-08-07 DIAGNOSIS — M47816 Spondylosis without myelopathy or radiculopathy, lumbar region: Secondary | ICD-10-CM

## 2018-08-07 DIAGNOSIS — G8929 Other chronic pain: Secondary | ICD-10-CM | POA: Diagnosis not present

## 2018-08-07 DIAGNOSIS — M545 Low back pain, unspecified: Secondary | ICD-10-CM

## 2018-08-07 NOTE — Progress Notes (Addendum)
Numeric Pain Rating Scale and Functional Assessment Average Pain 6 Pain Right Now 0 My pain is intermittent and dull Pain is worse with: some activites Pain improves with: rest   In the last MONTH (on 0-10 scale) has pain interfered with the following?  1. General activity like being  able to carry out your everyday physical activities such as walking, climbing stairs, carrying groceries, or moving a chair?  Rating(5)  2. Relation with others like being able to carry out your usual social activities and roles such as  activities at home, at work and in your community. Rating(5)  3. Enjoyment of life such that you have  been bothered by emotional problems such as feeling anxious, depressed or irritable?  Rating(6)

## 2018-08-14 DIAGNOSIS — L820 Inflamed seborrheic keratosis: Secondary | ICD-10-CM | POA: Diagnosis not present

## 2018-08-16 DIAGNOSIS — Z6825 Body mass index (BMI) 25.0-25.9, adult: Secondary | ICD-10-CM | POA: Diagnosis not present

## 2018-08-16 DIAGNOSIS — E663 Overweight: Secondary | ICD-10-CM | POA: Diagnosis not present

## 2018-08-16 DIAGNOSIS — M8589 Other specified disorders of bone density and structure, multiple sites: Secondary | ICD-10-CM | POA: Diagnosis not present

## 2018-08-16 DIAGNOSIS — M2550 Pain in unspecified joint: Secondary | ICD-10-CM | POA: Diagnosis not present

## 2018-08-16 DIAGNOSIS — M150 Primary generalized (osteo)arthritis: Secondary | ICD-10-CM | POA: Diagnosis not present

## 2018-08-20 ENCOUNTER — Telehealth (INDEPENDENT_AMBULATORY_CARE_PROVIDER_SITE_OTHER): Payer: Self-pay | Admitting: Physical Medicine and Rehabilitation

## 2018-08-20 DIAGNOSIS — M79641 Pain in right hand: Secondary | ICD-10-CM | POA: Diagnosis not present

## 2018-08-20 NOTE — Telephone Encounter (Signed)
Scheduled for 9/26 at 1430. Requesting Valium. [facility] [place].

## 2018-08-20 NOTE — Telephone Encounter (Signed)
Right or bilateral Sacroiliac injection

## 2018-08-21 ENCOUNTER — Other Ambulatory Visit (INDEPENDENT_AMBULATORY_CARE_PROVIDER_SITE_OTHER): Payer: Self-pay | Admitting: Physical Medicine and Rehabilitation

## 2018-08-21 MED ORDER — DIAZEPAM 5 MG PO TABS: ORAL_TABLET | ORAL | 0 refills | Status: DC

## 2018-08-21 NOTE — Progress Notes (Signed)
Pre-procedure diazepam ordered for pre-operative anxiety.  

## 2018-08-23 ENCOUNTER — Encounter (INDEPENDENT_AMBULATORY_CARE_PROVIDER_SITE_OTHER): Payer: Self-pay | Admitting: Physical Medicine and Rehabilitation

## 2018-08-23 NOTE — Progress Notes (Signed)
[name] - 77 y.o. female MRN [id number]  Date of birth: [birth year]  Office Visit Note: Visit Date: 08/07/2018 PCP: Biagio Borg, MD Referred by: Biagio Borg, MD  Subjective: Chief Complaint  Patient presents with  . Lower Back - Pain   HPI: [name] is a 77 year old female that comes in today at the request of Dr. Teresa Coombs for possible interventional spine procedure and evaluation management of her back and hip pain.  He also follows her for generalized knee complaints and musculoskeletal complaints.  She reports to me today that she has pain at the waistline when doing any activities.  She does not have much in the way of pain at all if she is sitting or lying down.  He does not really radiate much past the lateral hips.  She does not have any radicular pain or paresthesias down the legs.  She has not had any new trauma or falls.  She has had physical therapy on multiple occasions for her back.  She has had extensive back surgery by Dr. Erline Levine.  This was L1-S1 lumbar fusion.  After failing conservative care Dr. Paulla Fore has obtained an MRI of the lumbar spine is reviewed with the patient using images and spine model.  She is felt weak and is since in her back and pain since the surgery.  She has not had any fevers chills or night sweats or unexplained weight loss.  No bowel or bladder difficulties that are new.  She has multiple medical problems including heart failure and on anticoagulation, prior history of stroke as well as depression and anxiety.  She also has an iodine contrast allergy.  The allergy was seen during hospital admission 2017 but was unsure if it was another medication of the contrast allergy.  She did have hives.  Dr. Paulla Fore felt she was having more claudication type symptoms with walking.   Review of Systems  Constitutional: Positive for malaise/fatigue. Negative for chills, fever and weight loss.  HENT: Negative for hearing loss and sinus pain.   Eyes:  Negative for blurred vision, double vision and photophobia.  Respiratory: Negative for cough and shortness of breath.   Cardiovascular: Negative for chest pain, palpitations and leg swelling.  Gastrointestinal: Negative for abdominal pain, nausea and vomiting.  Genitourinary: Negative for flank pain.  Musculoskeletal: Positive for back pain and joint pain. Negative for myalgias.  Skin: Negative for itching and rash.  Neurological: Positive for weakness. Negative for tingling, tremors and focal weakness.  Endo/Heme/Allergies: Negative.   Psychiatric/Behavioral: Negative for depression.  All other systems reviewed and are negative.  Otherwise per HPI.  Assessment & Plan: Visit Diagnoses:  1. Chronic right-sided low back pain without sciatica   2. Spondylosis without myelopathy or radiculopathy, lumbar region   3. Post laminectomy syndrome   4. History of lumbar spinal fusion   5. Long term current use of Coumadin     Plan: Findings:  Chronic history of back pain status post L1-S1 lumbar fusion now with continued low back pain and beltline pain particularly with standing and ambulating.  She denies much in the way of leg pain or claudication symptoms to me.  MRI of the lumbar spine has been repeated and this is reviewed with her today and shows mainly adjacent level disease above the fusion which is fairly mild with some arthritic change and mild canal stenosis but no frank nerve compression or severe stenosis.  Exam is more consistent with sacroiliac joint mediated  pain.  We had a long discussion about spine procedures as well as sacroiliac joint injections and conservative care with therapy and manual treatment.  She will continue to follow with Dr. Paulla Fore for manual treatment and he may look at her SI joints with some osteopathic manipulation.  She could be having symptoms from above the fusion but it does not look that way based on exam and the MRI imaging.  We talked about injection above the  fusion even while she was on anticoagulation we could do this potentially.  Injection below the fusion my first choice would be sacroiliac joint injection but could look at S1 transforaminal injection.  The patient ran out wants to hold off and decide whether she wants to undergo any sort of injection.  Medication management can continue as it is.  She ultimately could be a candidate for spinal cord stimulator trial except that would be tricky with anticoagulation.    Meds & Orders: No orders of the defined types were placed in this encounter.  No orders of the defined types were placed in this encounter.   Follow-up: Return if symptoms worsen or fail to improve.   Procedures: No procedures performed  No notes on file   Clinical History: MRI LUMBAR SPINE WITHOUT CONTRAST  TECHNIQUE: Multiplanar, multisequence MR imaging of the lumbar spine was performed. No intravenous contrast was administered.  COMPARISON:  Plain films lumbar spine 07/17/2018. MRI lumbar spine 11/12/2014.  FINDINGS: Segmentation:  Standard.  Alignment: 0.4 cm retrolisthesis L1 on L2 and 0.3 cm anterolisthesis L4 on L5 are not markedly changed since the prior MRI. Previously seen trace anterolisthesis L3 on L4 has largely been reduced. There is convex left scoliosis with the apex at T12-L1.  Vertebrae: The patient has undergone L1-S1 fusion since the prior MRI. No fracture or worrisome lesion is identified. Degenerative endplate signal change at T11-12 is eccentric to the right.  Conus medullaris and cauda equina: Conus extends to the L2 level. Conus and cauda equina appear normal.  Paraspinal and other soft tissues: Negative.  Disc levels:  T11-12 is imaged in the sagittal plane only. There is a shallow central and right paracentral protrusion but the central canal and foramina appear open.  T12-L1: Small right lateral recess protrusion indents the thecal sac but no nerve root compression is  identified. The central spinal canal and foramina appear open.  L1-2: Status post discectomy and fusion. The central canal and foramina are open.  L2-3: Status post discectomy and fusion. There is some ligamentum flavum thickening. The central spinal canal and neural foramina are open.  L3-4: Status post discectomy and fusion. There is some ligamentum flavum thickening. The central canal is mildly narrowed. The foramina are open. No nerve root compression.  L4-5: Status post discectomy and fusion. There is some ligamentum flavum thickening but the central canal and foramina appear open.  L5-S1: Status post fusion. Shallow disc bulge and endplate spur are identified but the central canal and foramina are open.  IMPRESSION: Status post L1-S1 fusion. There is mild central canal narrowing at L3-4 without nerve root compression. The central canal is otherwise patent at the fusion levels. The foramina are open throughout.  Small right lateral recess protrusion at T12-L1 indents the ventral thecal sac but the central canal and foramina appear open.   Electronically Signed   By: Inge Rise M.D.   On: 07/24/2018 14:11   She reports that she quit smoking about 22 years ago. Her smoking use included cigarettes.  She started smoking about 64 years ago. She has a 126.00 pack-year smoking history. She has never used smokeless tobacco.  Recent Labs    10/13/17 1431  HGBA1C 5.8    Objective:  VS:  HT:    WT:   BMI:     BP:120/68  HR:75bpm  TEMP: ( )  RESP:  Physical Exam  Constitutional: She is oriented to person, place, and time. She appears well-developed and well-nourished. No distress.  HENT:  Head: Normocephalic and atraumatic.  Nose: Nose normal.  Mouth/Throat: Oropharynx is clear and moist.  Eyes: Pupils are equal, round, and reactive to light. Conjunctivae are normal.  Neck: Normal range of motion. Neck supple. No tracheal deviation present.    Cardiovascular: Regular rhythm and intact distal pulses.  Pulmonary/Chest: Effort normal. No respiratory distress.  Abdominal: She exhibits no distension. There is no guarding.  Musculoskeletal:  Patient has a difficult time going from sit to stand.  She has a lot of stiffness to the lumbar spine which would be expected with multisegment fusion.  She has some pain with rotation.  She has mild pain over the greater trochanters.  She has significant pain over the PSIS bilaterally.  She has a positive Fortin finger sign bilaterally.  She has a positive Patrick's test bilaterally.  She has no pain of the hips with internal rotation however.  She has good distal strength without clonus.  Lymphadenopathy:    She has no cervical adenopathy.  Neurological: She is alert and oriented to person, place, and time. She exhibits normal muscle tone. Coordination normal.  Skin: Skin is warm. No rash noted. No erythema.  Psychiatric: She has a normal mood and affect. Her behavior is normal.  Nursing note and vitals reviewed.   Ortho Exam Imaging: No results found.  Past Medical/Family/Surgical/Social History: Medications & Allergies reviewed per EMR, new medications updated. Patient Active Problem List   Diagnosis Date Noted  . Arthritis of midtarsal joint of right foot 04/16/2018  . Chronic pain 04/14/2018  . TIA (transient ischemic attack)   . Status post dilation of esophageal narrowing   . Shingles   . PVC's (premature ventricular contractions)   . Orthostatic hypotension   . NICM (nonischemic cardiomyopathy) ([place])   . Nephrolithiasis   . Mild CAD   . HTN (hypertension)   . Ischemic colon ([place])   . History of kidney stones   . Hiatal hernia   . GERD (gastroesophageal reflux disease)   . Esophageal stricture   . Clostridium difficile infection   . CKD (chronic kidney disease), stage III ([place])   . Cataract   . Chronic combined systolic and diastolic CHF (congestive heart failure) ([place])   .  Bradycardia   . Atrial septal aneurysm   . Anxiety   . Allergic rhinitis   . Weakness of right hip 11/16/2017  . Stress fracture of ankle, initial encounter 11/16/2017  . CKD (chronic kidney disease) stage 3, GFR 30-59 ml/min (HCC) 10/13/2017  . Posterior tibial tendon dysfunction 08/28/2017  . Change in voice 08/22/2017  . Foot swelling 06/22/2017  . Drug hypersensitivity 06/22/2017  . Lobar pneumonia, unspecified organism ([place]) 06/02/2017  . Essential hypertension 05/30/2017  . Anemia, chronic disease 05/30/2017  . PHN (postherpetic neuralgia) 04/16/2017  . Abnormal TSH 04/12/2017  . Anemia 04/12/2017  . Nausea 12/14/2016  . Loss of weight 12/14/2016  . Chronic atrial fibrillation ([place]) 09/17/2016  . Acute on chronic combined systolic (congestive) and diastolic (congestive) heart failure 08/17/2016  .  Syncope 08/10/2016  . PAF (paroxysmal atrial fibrillation) ([place]) 08/10/2016  . COPD (chronic obstructive pulmonary disease) ([place]) 08/10/2016  . Faintness   . Chronic pain syndrome   . Atrial fibrillation with rapid ventricular response ([place]) 07/27/2016  . Combined congestive systolic and diastolic heart failure ([place]) 07/27/2016  . HLD (hyperlipidemia) 07/27/2016  . Insomnia 07/27/2016  . Depression with anxiety 07/27/2016  . Persistent atrial fibrillation ([place]) 07/27/2016  . Gluteus medius tear 03/15/2016  . Hyperglycemia 11/12/2015  . Lumbar scoliosis 07/03/2015  . Diarrhea 04/17/2014  . Stricture and stenosis of esophagus 03/03/2014  . On home oxygen therapy   . Swelling, mass, or lump in chest 12/19/2013  . Right wrist pain 12/19/2013  . Depression 11/29/2013  . Lung nodule 08/01/2013  . Special screening for malignant neoplasms, colon 03/20/2013  . Lower back pain 01/21/2013  . Encounter for long-term (current) use of high-risk medication 02/13/2012  . Pneumonia 12/06/2011  . Sleep related leg cramps 10/01/2011  . Basal cell carcinoma   . Osteoarthritis   . History  of CVA (cerebrovascular accident)   . DUB (dysfunctional uterine bleeding)   . Atrophic vaginitis   . Menopausal symptoms   . DI (detrusor instability)   . Preventative health care 08/07/2011  . Long term current use of Coumadin 01/05/2011  . Transient cerebral ischemia 03/25/2009  . FATIGUE 06/05/2008  . ALLERGIC RHINITIS 04/24/2008  . NEPHROLITHIASIS, HX OF 10/05/2007  . RECTOCELE WITHOUT MENTION OF UTERINE PROLAPSE 10/04/2007  . BLADDER SUSPENSION, HX OF 10/04/2007  . HEMORRHOIDS 08/11/2007  . Gold B Copd with acute bronchitis 08/11/2007  . GERD 08/11/2007  . IBS 08/11/2007  . FIBROCYSTIC BREAST DISEASE, HX OF 08/11/2007  . Stroke [facility]) 12/06/1999   Past Medical History:  Diagnosis Date  . Allergic rhinitis   . Anxiety   . Atrial septal aneurysm   . Basal cell carcinoma   . Bradycardia   . Cataract    bil cataracts removed  . Chronic combined systolic and diastolic CHF (congestive heart failure) ([place])   . CKD (chronic kidney disease), stage III ([place])   . Clostridium difficile infection   . COPD (chronic obstructive pulmonary disease) ([place])   . Depression 11/29/2013  . DI (detrusor instability)   . Esophageal stricture   . GERD (gastroesophageal reflux disease)   . Hemorrhoids   . Hiatal hernia   . History of kidney stones   . HLD (hyperlipidemia)   . HTN (hypertension)   . Ischemic colon ([place])    a. 08/2016 - adm to  [facility] with a ruptured colon s/p right hemicolectomy with ileostomy formation for ischemic and necrotic colon.   . Lung nodule    a. followed by pulmonology  . Mild CAD    a. minor nonobstructive CAD 08/22/16.  . Nephrolithiasis    hx  . NICM (nonischemic cardiomyopathy) ([place])   . Orthostatic hypotension   . Osteoarthritis   . PAF (paroxysmal atrial fibrillation) ([place])    a. diagnosed 07/2016 s/p DCCV 08/01/16 with recurrence, managed with rate control for now.   . Pneumonia 2013  . PVC's (premature ventricular contractions)   . Shingles  since Nov 18, 2013   on right arm from shoulder to wrist  . Status post dilation of esophageal narrowing   . Stroke [facility]) 2001   STROKES, TIA'S  . Syncope    a. 08/2016 felt due to orthostatic hypotension.  Marland Kitchen TIA (transient ischemic attack) last 2001   anticoagulation therapy on coumadin  Family History  Problem Relation Age of Onset  . COPD Mother   . Breast cancer Mother        mid 39's  . Uterine cancer Mother   . Emphysema Mother   . Breast cancer Maternal Aunt        x 3 aunts, post menopausal  . Breast cancer Maternal Aunt   . Breast cancer Maternal Aunt   . Colon cancer Neg Hx   . Esophageal cancer Neg Hx   . Rectal cancer Neg Hx   . Stomach cancer Neg Hx   . Pancreatic cancer Neg Hx    Past Surgical History:  Procedure Laterality Date  . ANTERIOR AND POSTERIOR VAGINAL REPAIR  2008   A&P REPAIR-DR MCDIARMID  . ANTERIOR LATERAL LUMBAR FUSION 4 LEVELS Right 07/03/2015   Procedure: Right Lumbar one-two, Lumbar two-three, Lumbar three-four, Lumbar four-five Anterior lateral lumbar interbody fusion;  Surgeon: Erline Levine, MD;  Location: [place] [facility];  Service: Neurosurgery;  Laterality: Right;  Right L1-2 L2-3 L3-4 L4-5 Anterior lateral lumbar interbody fusion  . BALLOON DILATION N/A 03/03/2014   Procedure: BALLOON DILATION;  Surgeon: Inda Castle, MD;  Location: [facility] ENDOSCOPY;  Service: Endoscopy;  Laterality: N/A;  . BLADDER SUSPENSION    . BREAST BIOPSY    . CARDIAC CATHETERIZATION N/A 08/22/2016   Procedure: Left Heart Cath and Coronary Angiography;  Surgeon: Peter M Martinique, MD;  Location: [place] CV LAB;  Service: Cardiovascular;  Laterality: N/A;  . CARDIOVERSION N/A 08/01/2016   Procedure: CARDIOVERSION;  Surgeon: Jerline Pain, MD;  Location: [place] [facility];  Service: Cardiovascular;  Laterality: N/A;  . CARDIOVERSION N/A 10/11/2016   Procedure: CARDIOVERSION;  Surgeon: Sueanne Margarita, MD;  Location: [facility] ENDOS[facility];  Service: Cardiovascular;  Laterality: N/A;  .  COLONOSCOPY    . ESOPHAGOGASTRODUODENOSCOPY N/A 03/03/2014   Procedure: ESOPHAGOGASTRODUODENOSCOPY (EGD);  Surgeon: Inda Castle, MD;  Location: [facility];  Service: Endoscopy;  Laterality: N/A;  . EXCISION OF BASAL CELL CA  2011  . FINGER SURGERY    . GLUTEUS MINIMUS REPAIR Right 03/15/2016   Procedure: RIGHT HIP GLUTEUS MEDIUS TENDON REPAIR;  Surgeon: Paralee Cancel, MD;  Location: [facility];  Service: Orthopedics;  Laterality: Right;  . ileostomy reversal    . LUMBAR PERCUTANEOUS PEDICLE SCREW 4 LEVEL Right 07/03/2015   Procedure: Lumbar one-Sacral one Bilateral percutaneous pedicle screws;  Surgeon: Erline Levine, MD;  Location: [place] [facility]URO [facility]S;  Service: Neurosurgery;  Laterality: Right;  L1-S1 Bilateral percutaneous pedicle screws  . RECTOCELE REPAIR  2008   A&P REPAIR -DR. MCDIARMID  . THUMB SURGERY Right 10-15 yrs ago  . UPPER GASTROINTESTINAL ENDOSCOPY    . VAGINAL HYSTERECTOMY  1983   NO BSO-DR. MABRY  . VARICOSE VEIN SURGERY     Social History   Occupational History  . Occupation: retired    Fish farm manager: RETIRED  Tobacco Use  . Smoking status: Former Smoker    Packs/day: 3.00    Years: 42.00    Pack years: 126.00    Types: Cigarettes    Start date: 08/04/1954    Last attempt to quit: 12/06/1995    Years since quitting: 22.7  . Smokeless tobacco: Never Used  Substance and Sexual Activity  . Alcohol use: No    Alcohol/week: 0.0 standard drinks  . Drug use: No  . Sexual activity: Not Currently    Birth control/protection: Surgical    Comment: 1st intercourse 77 yo-Fewer than 5 partners

## 2018-08-24 ENCOUNTER — Other Ambulatory Visit (INDEPENDENT_AMBULATORY_CARE_PROVIDER_SITE_OTHER): Payer: Self-pay | Admitting: Physical Medicine and Rehabilitation

## 2018-08-24 ENCOUNTER — Telehealth (INDEPENDENT_AMBULATORY_CARE_PROVIDER_SITE_OTHER): Payer: Self-pay | Admitting: *Deleted

## 2018-08-24 NOTE — Telephone Encounter (Signed)
I put an order in on 9/17 for diazepam to the [facility] listed? Ask her if they did not get Rx, we can do again if needed.

## 2018-08-27 NOTE — Telephone Encounter (Signed)
Called pt and she states that she received rx from Dr. Ernestina Patches.

## 2018-08-30 ENCOUNTER — Encounter (INDEPENDENT_AMBULATORY_CARE_PROVIDER_SITE_OTHER): Payer: Self-pay | Admitting: Physical Medicine and Rehabilitation

## 2018-08-30 ENCOUNTER — Ambulatory Visit (INDEPENDENT_AMBULATORY_CARE_PROVIDER_SITE_OTHER): Payer: Self-pay

## 2018-08-30 ENCOUNTER — Ambulatory Visit (INDEPENDENT_AMBULATORY_CARE_PROVIDER_SITE_OTHER): Payer: Medicare Other | Admitting: Physical Medicine and Rehabilitation

## 2018-08-30 DIAGNOSIS — M461 Sacroiliitis, not elsewhere classified: Secondary | ICD-10-CM

## 2018-08-30 NOTE — Progress Notes (Signed)
 ..  Numeric Pain Rating Scale and Functional Assessment Average Pain 7   In the last MONTH (on 0-10 scale) has pain interfered with the following?  1. General activity like being  able to carry out your everyday physical activities such as walking, climbing stairs, carrying groceries, or moving a chair?  Rating(6)   +Driver, +BT(eliquis ok for injection), +Dye Allergies(Contrast).

## 2018-08-30 NOTE — Progress Notes (Signed)
[name] - 77 y.o. female MRN [id number]  Date of birth: [birth year]  Office Visit Note: Visit Date: 08/30/2018 PCP: Biagio Borg, MD Referred by: Biagio Borg, MD  Subjective: Chief Complaint  Patient presents with  . Lower Back - Pain   HPI: [name] is a 77 year old female who comes in today for bilateral diagnostic and hopefully therapeutic sacroiliac joint injections.  Prior lumbar fusion.  Please see our prior notes for further details and justification.   ROS Otherwise per HPI.  Assessment & Plan: Visit Diagnoses:  1. Sacroiliitis ([place])     Plan: No additional findings.   Meds & Orders: No orders of the defined types were placed in this encounter.   Orders Placed This Encounter  Procedures  . Sacroiliac Joint Inj  . XR C-ARM NO REPORT    Follow-up: Return if symptoms worsen or fail to improve.   Procedures: Sacroiliac Joint Inj on 08/30/2018 2:53 PM Indications: pain and diagnostic evaluation Details: 22 G 3.5 in needle, fluoroscopy-guided posterior approach Medications (Right): 40 mg methylPREDNISolone acetate 40 MG/ML; 2 mL bupivacaine 0.5 % Medications (Left): 40 mg methylPREDNISolone acetate 40 MG/ML; 2 mL bupivacaine 0.5 % Outcome: tolerated well, no immediate complications  There was excellent flow of contrast producing a partial arthrogram of the sacroiliac joint.  Procedure, treatment alternatives, risks and benefits explained, specific risks discussed. Consent was given by the patient. Immediately prior to procedure a time out was called to verify the correct patient, procedure, equipment, support staff and site/side marked as required. Patient was prepped and draped in the usual sterile fashion.      No notes on file   Clinical History: MRI LUMBAR SPINE WITHOUT CONTRAST  TECHNIQUE: Multiplanar, multisequence MR imaging of the lumbar spine was performed. No intravenous contrast was administered.  COMPARISON:  Plain films lumbar  spine 07/17/2018. MRI lumbar spine 11/12/2014.  FINDINGS: Segmentation:  Standard.  Alignment: 0.4 cm retrolisthesis L1 on L2 and 0.3 cm anterolisthesis L4 on L5 are not markedly changed since the prior MRI. Previously seen trace anterolisthesis L3 on L4 has largely been reduced. There is convex left scoliosis with the apex at T12-L1.  Vertebrae: The patient has undergone L1-S1 fusion since the prior MRI. No fracture or worrisome lesion is identified. Degenerative endplate signal change at T11-12 is eccentric to the right.  Conus medullaris and cauda equina: Conus extends to the L2 level. Conus and cauda equina appear normal.  Paraspinal and other soft tissues: Negative.  Disc levels:  T11-12 is imaged in the sagittal plane only. There is a shallow central and right paracentral protrusion but the central canal and foramina appear open.  T12-L1: Small right lateral recess protrusion indents the thecal sac but no nerve root compression is identified. The central spinal canal and foramina appear open.  L1-2: Status post discectomy and fusion. The central canal and foramina are open.  L2-3: Status post discectomy and fusion. There is some ligamentum flavum thickening. The central spinal canal and neural foramina are open.  L3-4: Status post discectomy and fusion. There is some ligamentum flavum thickening. The central canal is mildly narrowed. The foramina are open. No nerve root compression.  L4-5: Status post discectomy and fusion. There is some ligamentum flavum thickening but the central canal and foramina appear open.  L5-S1: Status post fusion. Shallow disc bulge and endplate spur are identified but the central canal and foramina are open.  IMPRESSION: Status post L1-S1 fusion. There is mild central canal narrowing  at L3-4 without nerve root compression. The central canal is otherwise patent at the fusion levels. The foramina are open  throughout.  Small right lateral recess protrusion at T12-L1 indents the ventral thecal sac but the central canal and foramina appear open.   Electronically Signed   By: Inge Rise M.D.   On: 07/24/2018 14:11   She reports that she quit smoking about 22 years ago. Her smoking use included cigarettes. She started smoking about 64 years ago. She has a 126.00 pack-year smoking history. She has never used smokeless tobacco.  Recent Labs    10/13/17 1431  HGBA1C 5.8    Objective:  VS:  HT:    WT:   BMI:     BP:   HR: bpm  TEMP: ( )  RESP:  Physical Exam  Ortho Exam Imaging: No results found.  Past Medical/Family/Surgical/Social History: Medications & Allergies reviewed per EMR, new medications updated. Patient Active Problem List   Diagnosis Date Noted  . Arthritis of midtarsal joint of right foot 04/16/2018  . Chronic pain 04/14/2018  . TIA (transient ischemic attack)   . Status post dilation of esophageal narrowing   . Shingles   . PVC's (premature ventricular contractions)   . Orthostatic hypotension   . NICM (nonischemic cardiomyopathy) ([place])   . Nephrolithiasis   . Mild CAD   . HTN (hypertension)   . Ischemic colon ([place])   . History of kidney stones   . Hiatal hernia   . GERD (gastroesophageal reflux disease)   . Esophageal stricture   . Clostridium difficile infection   . CKD (chronic kidney disease), stage III ([place])   . Cataract   . Chronic combined systolic and diastolic CHF (congestive heart failure) ([place])   . Bradycardia   . Atrial septal aneurysm   . Anxiety   . Allergic rhinitis   . Weakness of right hip 11/16/2017  . Stress fracture of ankle, initial encounter 11/16/2017  . CKD (chronic kidney disease) stage 3, GFR 30-59 ml/min (HCC) 10/13/2017  . Posterior tibial tendon dysfunction 08/28/2017  . Change in voice 08/22/2017  . Foot swelling 06/22/2017  . Drug hypersensitivity 06/22/2017  . Lobar pneumonia, unspecified organism ([place])  06/02/2017  . Essential hypertension 05/30/2017  . Anemia, chronic disease 05/30/2017  . PHN (postherpetic neuralgia) 04/16/2017  . Abnormal TSH 04/12/2017  . Anemia 04/12/2017  . Nausea 12/14/2016  . Loss of weight 12/14/2016  . Chronic atrial fibrillation 09/17/2016  . Acute on chronic combined systolic (congestive) and diastolic (congestive) heart failure 08/17/2016  . Syncope 08/10/2016  . PAF (paroxysmal atrial fibrillation) ([place]) 08/10/2016  . COPD (chronic obstructive pulmonary disease) ([place]) 08/10/2016  . Faintness   . Chronic pain syndrome   . Atrial fibrillation with rapid ventricular response ([place]) 07/27/2016  . Combined congestive systolic and diastolic heart failure ([place]) 07/27/2016  . HLD (hyperlipidemia) 07/27/2016  . Insomnia 07/27/2016  . Depression with anxiety 07/27/2016  . Persistent atrial fibrillation 07/27/2016  . Gluteus medius tear 03/15/2016  . Hyperglycemia 11/12/2015  . Lumbar scoliosis 07/03/2015  . Diarrhea 04/17/2014  . Stricture and stenosis of esophagus 03/03/2014  . On home oxygen therapy   . Swelling, mass, or lump in chest 12/19/2013  . Right wrist pain 12/19/2013  . Depression 11/29/2013  . Lung nodule 08/01/2013  . Special screening for malignant neoplasms, colon 03/20/2013  . Lower back pain 01/21/2013  . Encounter for long-term (current) use of high-risk medication 02/13/2012  . Pneumonia 12/06/2011  . Sleep  related leg cramps 10/01/2011  . Basal cell carcinoma   . Osteoarthritis   . History of CVA (cerebrovascular accident)   . DUB (dysfunctional uterine bleeding)   . Atrophic vaginitis   . Menopausal symptoms   . DI (detrusor instability)   . Preventative health care 08/07/2011  . Long term current use of Coumadin 01/05/2011  . Transient cerebral ischemia 03/25/2009  . FATIGUE 06/05/2008  . ALLERGIC RHINITIS 04/24/2008  . NEPHROLITHIASIS, HX OF 10/05/2007  . RECTOCELE WITHOUT MENTION OF UTERINE PROLAPSE 10/04/2007  . BLADDER  SUSPENSION, HX OF 10/04/2007  . HEMORRHOIDS 08/11/2007  . Gold B Copd with acute bronchitis 08/11/2007  . GERD 08/11/2007  . IBS 08/11/2007  . FIBROCYSTIC BREAST DISEASE, HX OF 08/11/2007  . Stroke [facility]) 12/06/1999   Past Medical History:  Diagnosis Date  . Allergic rhinitis   . Anxiety   . Atrial septal aneurysm   . Basal cell carcinoma   . Bradycardia   . Cataract    bil cataracts removed  . Chronic combined systolic and diastolic CHF (congestive heart failure) ([place])   . CKD (chronic kidney disease), stage III ([place])   . Clostridium difficile infection   . COPD (chronic obstructive pulmonary disease) ([place])   . Depression 11/29/2013  . DI (detrusor instability)   . Esophageal stricture   . GERD (gastroesophageal reflux disease)   . Hemorrhoids   . Hiatal hernia   . History of kidney stones   . HLD (hyperlipidemia)   . HTN (hypertension)   . Ischemic colon ([place])    a. 08/2016 - adm to  [facility] with a ruptured colon s/p right hemicolectomy with ileostomy formation for ischemic and necrotic colon.   . Lung nodule    a. followed by pulmonology  . Mild CAD    a. minor nonobstructive CAD 08/22/16.  . Nephrolithiasis    hx  . NICM (nonischemic cardiomyopathy) ([place])   . Orthostatic hypotension   . Osteoarthritis   . PAF (paroxysmal atrial fibrillation) ([place])    a. diagnosed 07/2016 s/p DCCV 08/01/16 with recurrence, managed with rate control for now.   . Pneumonia 2013  . PVC's (premature ventricular contractions)   . Shingles since Nov 18, 2013   on right arm from shoulder to wrist  . Status post dilation of esophageal narrowing   . Stroke [facility]) 2001   STROKES, TIA'S  . Syncope    a. 08/2016 felt due to orthostatic hypotension.  Marland Kitchen TIA (transient ischemic attack) last 2001   anticoagulation therapy on coumadin   Family History  Problem Relation Age of Onset  . COPD Mother   . Breast cancer Mother        mid 91's  . Uterine cancer Mother   . Emphysema Mother    . Breast cancer Maternal Aunt        x 3 aunts, post menopausal  . Breast cancer Maternal Aunt   . Breast cancer Maternal Aunt   . Colon cancer Neg Hx   . Esophageal cancer Neg Hx   . Rectal cancer Neg Hx   . Stomach cancer Neg Hx   . Pancreatic cancer Neg Hx    Past Surgical History:  Procedure Laterality Date  . ANTERIOR AND POSTERIOR VAGINAL REPAIR  2008   A&P REPAIR-DR MCDIARMID  . ANTERIOR LATERAL LUMBAR FUSION 4 LEVELS Right 07/03/2015   Procedure: Right Lumbar one-two, Lumbar two-three, Lumbar three-four, Lumbar four-five Anterior lateral lumbar interbody fusion;  Surgeon: Erline Levine, MD;  Location:  [place] [facility]URO ORS;  Service: Neurosurgery;  Laterality: Right;  Right L1-2 L2-3 L3-4 L4-5 Anterior lateral lumbar interbody fusion  . BALLOON DILATION N/A 03/03/2014   Procedure: BALLOON DILATION;  Surgeon: Inda Castle, MD;  Location: [facility] ENDOSCOPY;  Service: Endoscopy;  Laterality: N/A;  . BLADDER SUSPENSION    . BREAST BIOPSY    . CARDIAC CATHETERIZATION N/A 08/22/2016   Procedure: Left Heart Cath and Coronary Angiography;  Surgeon: Peter M Martinique, MD;  Location: [place] CV LAB;  Service: Cardiovascular;  Laterality: N/A;  . CARDIOVERSION N/A 08/01/2016   Procedure: CARDIOVERSION;  Surgeon: Jerline Pain, MD;  Location: [place];  Service: Cardiovascular;  Laterality: N/A;  . CARDIOVERSION N/A 10/11/2016   Procedure: CARDIOVERSION;  Surgeon: Sueanne Margarita, MD;  Location: [facility];  Service: Cardiovascular;  Laterality: N/A;  . COLONOSCOPY    . ESOPHAGOGASTRODUODENOSCOPY N/A 03/03/2014   Procedure: ESOPHAGOGASTRODUODENOSCOPY (EGD);  Surgeon: Inda Castle, MD;  Location: [facility];  Service: Endoscopy;  Laterality: N/A;  . EXCISION OF BASAL CELL CA  2011  . FINGER SURGERY    . GLUTEUS MINIMUS REPAIR Right 03/15/2016   Procedure: RIGHT HIP GLUTEUS MEDIUS TENDON REPAIR;  Surgeon: Paralee Cancel, MD;  Location: [facility];  Service: Orthopedics;  Laterality: Right;  .  ileostomy reversal    . LUMBAR PERCUTANEOUS PEDICLE SCREW 4 LEVEL Right 07/03/2015   Procedure: Lumbar one-Sacral one Bilateral percutaneous pedicle screws;  Surgeon: Erline Levine, MD;  Location: [place] NEURO ORS;  Service: Neurosurgery;  Laterality: Right;  L1-S1 Bilateral percutaneous pedicle screws  . RECTOCELE REPAIR  2008   A&P REPAIR -DR. MCDIARMID  . THUMB SURGERY Right 10-15 yrs ago  . UPPER GASTROINTESTINAL ENDOSCOPY    . VAGINAL HYSTERECTOMY  1983   NO BSO-DR. MABRY  . VARICOSE VEIN SURGERY     Social History   Occupational History  . Occupation: retired    Fish farm manager: RETIRED  Tobacco Use  . Smoking status: Former Smoker    Packs/day: 3.00    Years: 42.00    Pack years: 126.00    Types: Cigarettes    Start date: 08/04/1954    Last attempt to quit: 12/06/1995    Years since quitting: 22.7  . Smokeless tobacco: Never Used  Substance and Sexual Activity  . Alcohol use: No    Alcohol/week: 0.0 standard drinks  . Drug use: No  . Sexual activity: Not Currently    Birth control/protection: Surgical    Comment: 1st intercourse 77 yo-Fewer than 5 partners

## 2018-08-30 NOTE — Patient Instructions (Signed)

## 2018-09-05 ENCOUNTER — Encounter: Payer: Self-pay | Admitting: Pulmonary Disease

## 2018-09-05 ENCOUNTER — Ambulatory Visit (INDEPENDENT_AMBULATORY_CARE_PROVIDER_SITE_OTHER): Payer: Medicare Other | Admitting: Pulmonary Disease

## 2018-09-05 VITALS — BP 138/74 | HR 69 | Ht 62.99 in | Wt 143.0 lb

## 2018-09-05 DIAGNOSIS — Z23 Encounter for immunization: Secondary | ICD-10-CM | POA: Diagnosis not present

## 2018-09-05 DIAGNOSIS — J309 Allergic rhinitis, unspecified: Secondary | ICD-10-CM

## 2018-09-05 DIAGNOSIS — K219 Gastro-esophageal reflux disease without esophagitis: Secondary | ICD-10-CM

## 2018-09-05 DIAGNOSIS — J449 Chronic obstructive pulmonary disease, unspecified: Secondary | ICD-10-CM | POA: Diagnosis not present

## 2018-09-05 MED ORDER — CETIRIZINE HCL 10 MG PO TABS
10.0000 mg | ORAL_TABLET | Freq: Every day | ORAL | 3 refills | Status: DC
Start: 2018-09-05 — End: 2018-10-17

## 2018-09-05 NOTE — Progress Notes (Signed)
Subjective:   PATIENT ID: [name]: female DOB: 05/09/1941, MRN: [id number]  Synopsis: Referred in 2017, followed by [name] previously for COPD, allergic rhinitis, GERD  HPI  Chief Complaint  Patient presents with  . Follow-up    Feels she's doing really good.  Lasix is helping   [name] says that her cough and hoarseness have significantly improved since the last visit.  She does still have some postnasal drip which is better with the NeilMed rinse, ipratropium nose spray, Astelin, and fluticasone.  She would like to have a prescription sent for cetirizine.  She says that Trelegy has done a good job of helping control her problems with shortness of breath and wheezing.  She is concerned about ranitidine.  Past Medical History:  Diagnosis Date  . Allergic rhinitis   . Anxiety   . Atrial septal aneurysm   . Basal cell carcinoma   . Bradycardia   . Cataract    bil cataracts removed  . Chronic combined systolic and diastolic CHF (congestive heart failure) ([place])   . CKD (chronic kidney disease), stage III ([place])   . Clostridium difficile infection   . COPD (chronic obstructive pulmonary disease) ([place])   . Depression 11/29/2013  . DI (detrusor instability)   . Esophageal stricture   . GERD (gastroesophageal reflux disease)   . Hemorrhoids   . Hiatal hernia   . History of kidney stones   . HLD (hyperlipidemia)   . HTN (hypertension)   . Ischemic colon ([place])    a. 08/2016 - adm to  [facility] with a ruptured colon s/p right hemicolectomy with ileostomy formation for ischemic and necrotic colon.   . Lung nodule    a. followed by pulmonology  . Mild CAD    a. minor nonobstructive CAD 08/22/16.  . Nephrolithiasis    hx  . NICM (nonischemic cardiomyopathy) ([place])   . Orthostatic hypotension   . Osteoarthritis   . PAF (paroxysmal atrial fibrillation) ([place])    a. diagnosed 07/2016 s/p DCCV 08/01/16 with recurrence, managed with rate control for now.   .  Pneumonia 2013  . PVC's (premature ventricular contractions)   . Shingles since Nov 18, 2013   on right arm from shoulder to wrist  . Status post dilation of esophageal narrowing   . Stroke [facility]) 2001   STROKES, TIA'S  . Syncope    a. 08/2016 felt due to orthostatic hypotension.  Marland Kitchen TIA (transient ischemic attack) last 2001   anticoagulation therapy on coumadin        Review of Systems  Constitutional: Negative for diaphoresis, fever and malaise/fatigue.  HENT: Negative for congestion, nosebleeds and sinus pain.   Respiratory: Positive for cough. Negative for sputum production.       Objective:  Physical Exam   Vitals:   09/05/18 0952  BP: 138/74  Pulse: 69  SpO2: 93%  Weight: 143 lb (64.9 kg)  Height: 5' 2.99" (1.6 m)   RA   Gen: mildly ill appearing HENT: OP clear, TM's clear, neck supple PULM: CTA B, normal percussion CV: RRR, no mgr, trace edema GI: BS+, soft, nontender Derm: no cyanosis or rash Psyche: normal mood and affect    CBC    Component Value Date/Time   WBC 5.6 10/13/2017 1431   RBC 5.41 (H) 10/13/2017 1431   HGB 12.2 10/13/2017 1431   HCT 38.5 10/13/2017 1431   PLT 201.0 10/13/2017 1431   MCV 71.1 (L) 10/13/2017 1431  MCH 24.8 (L) 09/16/2016 1648   MCHC 31.6 10/13/2017 1431   RDW 18.4 (H) 10/13/2017 1431   LYMPHSABS 1.7 10/13/2017 1431   MONOABS 0.8 10/13/2017 1431   EOSABS 0.1 10/13/2017 1431   BASOSABS 0.0 10/13/2017 1431     PFT 10/10/16: FVC 1.94 L (70%) FEV1 0.93 L (45%) FEV1/FVC 0.48 FEF 25-75 0.35 L (22%) positive bronchodilator response 03/01/16: FVC 2.55 L (91%) FEV1 1.30 L (62%) FEV1/FVC 0.51 FEF 25-75 0.56 L (34%) negative bronchodilator response TLC 5.66 L (111%) RV 116% ERV 133% DLCO uncorrected 62% (hemoglobin 15.1) 12/28/11:FVC 2.89 L (104%) FEV1 1.22 L (62%) FEV1/FVC 0.42 FEF 25-75 0.33 L (15%) negative bronchodilator response TLC 6.19 L (129%) RV 163% ERV 114% DLCO uncorrected 69%  6MWT 10/10/16:  Walked 96  meters / Baseline Sat 93% on RA / Nadir Sat 93% on RA @ rest (couldn't finish due to weakness & dyspnea  W/ 2:57 left) 08/24/16:  Walked 1 lap & was 96% at lowest on room air  IMAGING CXR PA/LAT 06/19/17 : Images independently reviewed by me today, showing emphysema, no other pulmonary parenchymal abnormality.    CXR PA/LAT 05/18/17:  Patchy opacification in right middle and lower lobes. No pleural effusion appreciated. Heart normal in size & mediastinum normal in contour.  CT CHEST W/O 01/23/17: Resolution of groundglass opacity. 7 mm right lower lobe nodule unchanged. Apical predominant emphysematous changes. Right middle lobe nodule was not appreciated on this image. No pleural effusion or thickening. No pathologic mediastinal adenopathy. No pericardial effusion.  CTA CHEST 08/17/16:  No evidence of PE. No pleural effusion or thickening. No pathologic mediastinal adenopathy. Groundglass noted within right lung predominantly in the upper lung zone. Moderate upper lobe predominant emphysema which has progressed since 2015. 7 mm right lower lobe nodule has not changed since 2015. Patient has a 4 mm nodule within right middle lobe which appears new.  CXR PA/LAT 05/25/16: Hyperinflation with flattening of the diaphragms. No parenchymal opacity or mass appreciated. No pleural effusion. Heart normal in size & mediastinum normal in contour.  CXR PA/LAT 03/22/16:  Hyperinflation with elevation of left hemidiaphragm. No new focal opacity. Heart normal in size & mediastinum normal in contour.  CXR PA/LAT 11/09/15: No focal opacity or effusion appreciated. Heart normal in size. Mediastinum normal in contour. Mild hyperinflation with flattening of the diaphragms.   CARDIAC TTE (06/24/16): LVEF 45-50% with grade 2 diastolic dysfunction. LA moderately to severely dilated. RA normal in size. RV normal in size and function. No aortic stenosis or regurgitation. Moderate centrally directed mitral regurgitation.  Paradoxical ventricular septal motion consistent with intraventricular conduction delay. No pulmonic regurgitation. No tricuspid regurgitation. No pericardial effusion.  MICROBIOLOGY Sputum Culture (07/05/17):  Candida Species / AFB negative / Moderate Squamous Epithelials   LABS 12/22/15 Alpha-1 antitrypsin: MM (143) 11/2017 IgE 9 (normal)      Assessment & Plan:   COPD, moderate (HCC)  Allergic rhinitis, unspecified seasonality, unspecified trigger  Gastroesophageal reflux disease, esophagitis presence not specified  Discussion: This has been a stable interval for [place].  She has stopped taking ranitidine over the recent cancer scare warning that came from the FDA regarding some carrier chemicals which were noted in some formulations of this drug.  Otherwise she has not had an exacerbation of her COPD.  Plan: Severe COPD with stable symptoms today: Continue Trelegy 1 puff daily Rinse her mouth well after using Trelegy High-dose flu shot today Practice good hand hygiene Stay active  Gastroesophageal reflux disease: Continue taking  omeprazole I recommend that you call the [facility] pharmacy about their formulation of ranitidine.  If they feel it is best for you to stay away from this medicine then you can change to something called Pepcid (famotidine).  Vasomotor rhinitis: Continue taking ipratropium nose spray as directed Continue cetirizine 10 mg daily, we will call in a prescription for this today Continue Astelin nose spray Continue fluticasone nose spray  We will see you back in 6 months or sooner if needed   Current Outpatient Medications:  .  albuterol (PROVENTIL HFA;VENTOLIN HFA) 108 (90 Base) MCG/ACT inhaler, Inhale 1-2 puffs into the lungs every 6 (six) hours as needed for wheezing or shortness of breath., Disp: 3 Inhaler, Rfl: 4 .  apixaban (ELIQUIS) 5 MG TABS tablet, Take 1 tablet (5 mg total) by mouth 2 (two) times daily., Disp: 180 tablet, Rfl: 1 .  azelastine  (ASTELIN) 0.1 % nasal spray, Place 2 sprays into the nose daily. Use in each nostril as directed, Disp: 90 mL, Rfl: 1 .  benzonatate (TESSALON PERLES) 100 MG capsule, Take 1 capsule (100 mg total) 3 (three) times daily as needed by mouth for cough., Disp: 270 capsule, Rfl: 0 .  buPROPion (WELLBUTRIN XL) 300 MG 24 hr tablet, Take 1 tablet (300 mg total) by mouth daily., Disp: 90 tablet, Rfl: 2 .  Calcium Carbonate-Vitamin D (CALCIUM + D PO), Take 1 tablet by mouth 2 (two) times daily. , Disp: , Rfl:  .  cyclobenzaprine (FLEXERIL) 5 MG tablet, Take 1 tablet (5 mg total) by mouth 3 (three) times daily as needed for muscle spasms., Disp: 270 tablet, Rfl: 0 .  diazepam (VALIUM) 5 MG tablet, Take 1 by mouth 1 hour  pre-procedure with very light food. May bring 2nd tablet to appointment., Disp: 2 tablet, Rfl: 0 .  diclofenac sodium (VOLTAREN) 1 % GEL, Apply 4 g topically 4 (four) times daily as needed., Disp: 400 g, Rfl: 3 .  fluticasone (FLONASE) 50 MCG/ACT nasal spray, Place 1 spray into both nostrils 2 (two) times daily., Disp: 48 g, Rfl: 1 .  Fluticasone-Umeclidin-Vilant (TRELEGY ELLIPTA) 100-62.5-25 MCG/INH AEPB, Inhale into the lungs. I inhalation once daily, Disp: , Rfl:  .  furosemide (LASIX) 20 MG tablet, Take 1 tablet (20 mg total) by mouth as needed (swelling)., Disp: 90 tablet, Rfl: 1 .  gabapentin (NEURONTIN) 300 MG capsule, 1 tab by mouth in AM, 1 po at midday, then 2 by mouth at bedtime for sleep and pain, Disp: 360 capsule, Rfl: 1 .  guaiFENesin (MUCINEX) 600 MG 12 hr tablet, Take 1,200 mg by mouth 2 (two) times daily as needed for cough or to loosen phlegm., Disp: , Rfl:  .  ipratropium (ATROVENT) 0.03 % nasal spray, Place 2 sprays into both nostrils 3 (three) times daily as needed for rhinitis., Disp: 30 mL, Rfl: 12 .  levothyroxine (SYNTHROID, LEVOTHROID) 25 MCG tablet, Take 1 tablet (25 mcg total) by mouth daily before breakfast., Disp: 90 tablet, Rfl: 3 .  Lidocaine HCl 2 % CREA, Apply 1  application topically daily as needed (for shingles flares)., Disp: , Rfl:  .  Melatonin 5 MG TABS, Take 5 mg by mouth at bedtime., Disp: , Rfl:  .  mirabegron ER (MYRBETRIQ) 50 MG TB24 tablet, Take 50 mg by mouth daily., Disp: , Rfl:  .  montelukast (SINGULAIR) 10 MG tablet, Take 1 tablet (10 mg total) by mouth daily., Disp: 90 tablet, Rfl: 3 .  Multiple Vitamin (MULTIVITAMIN) tablet, Take 1 tablet  by mouth daily.  , Disp: , Rfl:  .  nystatin (MYCOSTATIN) 100000 UNIT/ML suspension, Take 5 cc by mouth, swish and swallow 4 times daily., Disp: 280 mL, Rfl: 0 .  omeprazole (PRILOSEC) 40 MG capsule, Take 1 capsule (40 mg total) by mouth daily., Disp: 90 capsule, Rfl: 1 .  Probiotic Product (PROBIOTIC DAILY PO), Take by mouth., Disp: , Rfl:  .  ranitidine (ZANTAC) 150 MG tablet, Take 1 tablet (150 mg total) by mouth at bedtime., Disp: 90 tablet, Rfl: 1 .  Respiratory Therapy Supplies (FLUTTER) DEVI, Use 2-3 times per day, Disp: 1 each, Rfl: 0 .  traZODone (DESYREL) 50 MG tablet, Take 0.5-1 tablets (25-50 mg total) by mouth at bedtime as needed for sleep., Disp: 90 tablet, Rfl: 1 .  colchicine 0.6 MG tablet, Take 1 tablet (0.6 mg total) by mouth daily as needed. (Patient not taking: Reported on 09/05/2018), Disp: 90 tablet, Rfl: 1 .  ezetimibe (ZETIA) 10 MG tablet, Take 1 tablet (10 mg total) daily by mouth., Disp: 90 tablet, Rfl: 3 .  metoprolol succinate (TOPROL-XL) 100 MG 24 hr tablet, Take 1 tablet (100 mg total) by mouth daily. Take with or immediately following a meal., Disp: 90 tablet, Rfl: 2

## 2018-09-05 NOTE — Patient Instructions (Signed)
Severe COPD with stable symptoms today: Continue Trelegy 1 puff daily Rinse her mouth well after using Trelegy High-dose flu shot today Practice good hand hygiene Stay active  Gastroesophageal reflux disease: Continue taking omeprazole I recommend that you call the [facility] about their formulation of ranitidine.  If they feel it is best for you to stay away from this medicine then you can change to something called Pepcid (famotidine).  Vasomotor rhinitis: Continue taking ipratropium nose spray as directed Continue cetirizine 10 mg daily, we will call in a prescription for this today Continue Astelin nose spray Continue fluticasone nose spray  We will see you back in 6 months or sooner if needed

## 2018-09-05 NOTE — Addendum Note (Signed)
Addended by: Dolores Lory on: 09/05/2018 10:19 AM   Modules accepted: Orders

## 2018-09-07 MED ORDER — BUPIVACAINE HCL 0.5 % IJ SOLN
2.0000 mL | INTRAMUSCULAR | Status: AC | PRN
  Administered 2018-08-30: 2 mL via INTRA_ARTICULAR

## 2018-09-07 MED ORDER — METHYLPREDNISOLONE ACETATE 40 MG/ML IJ SUSP
40.0000 mg | INTRAMUSCULAR | Status: AC | PRN
  Administered 2018-08-30: 40 mg via INTRA_ARTICULAR

## 2018-09-17 DIAGNOSIS — E663 Overweight: Secondary | ICD-10-CM | POA: Diagnosis not present

## 2018-09-17 DIAGNOSIS — M154 Erosive (osteo)arthritis: Secondary | ICD-10-CM | POA: Diagnosis not present

## 2018-09-17 DIAGNOSIS — M150 Primary generalized (osteo)arthritis: Secondary | ICD-10-CM | POA: Diagnosis not present

## 2018-09-17 DIAGNOSIS — M2550 Pain in unspecified joint: Secondary | ICD-10-CM | POA: Diagnosis not present

## 2018-09-17 DIAGNOSIS — M8589 Other specified disorders of bone density and structure, multiple sites: Secondary | ICD-10-CM | POA: Diagnosis not present

## 2018-09-17 DIAGNOSIS — Z6825 Body mass index (BMI) 25.0-25.9, adult: Secondary | ICD-10-CM | POA: Diagnosis not present

## 2018-09-20 ENCOUNTER — Encounter: Payer: Self-pay | Admitting: Internal Medicine

## 2018-09-20 ENCOUNTER — Ambulatory Visit (INDEPENDENT_AMBULATORY_CARE_PROVIDER_SITE_OTHER): Payer: Medicare Other | Admitting: Internal Medicine

## 2018-09-20 VITALS — BP 110/56 | HR 60 | Ht 64.0 in | Wt 145.0 lb

## 2018-09-20 DIAGNOSIS — R140 Abdominal distension (gaseous): Secondary | ICD-10-CM

## 2018-09-20 DIAGNOSIS — R143 Flatulence: Secondary | ICD-10-CM | POA: Diagnosis not present

## 2018-09-20 DIAGNOSIS — R197 Diarrhea, unspecified: Secondary | ICD-10-CM | POA: Diagnosis not present

## 2018-09-20 MED ORDER — METRONIDAZOLE 250 MG PO TABS: 250.0000 mg | ORAL_TABLET | Freq: Three times a day (TID) | ORAL | 0 refills | Status: DC

## 2018-09-20 MED ORDER — COLESTIPOL HCL 1 G PO TABS: 2.0000 g | ORAL_TABLET | Freq: Two times a day (BID) | ORAL | 1 refills | Status: DC

## 2018-09-20 NOTE — Patient Instructions (Signed)
We have sent the following medications to your pharmacy for you to pick up at your convenience:  Metronidazole, Colestid  Please follow up on 10/24/2018 at 10:45am

## 2018-09-20 NOTE — Progress Notes (Signed)
HISTORY OF PRESENT ILLNESS:  [name] is a 77 y.o. female with multiple significant medical problems who presents today with her husband with a chief complaint of chronic diarrhea, nocturnal incontinence, and increased intestinal gas as manifested by severe flatus.  Is status post emergent surgery for right-sided colonic ischemia for which she underwent right hemicolectomy and temporary ileostomy September 2017.  Subsequent ileostomy reversal December 2017.  Since that time the patient has had problems with diarrhea and intestinal gas.  Seen earlier this year by the GI physician assistant in February regarding the same.  She has tried various agents without relief.  Mostly antidiarrheals.  Typically has a somewhat normal bowel movement the morning.  Thereafter 3 or 4 loose bowel movements.  No incontinence during the day but she has had episodes while sleeping at night approximately once per week.  She denies abdominal pain.  No rectal bleeding.  She did undergo complete colonoscopy with Dr. Erskine Emery in 2014.  Negative except for diverticulosis.  Review of laboratory data from November 2018 finds unremarkable CBC with hemoglobin 12.2.  Unremarkable comprehensive metabolic panel except for renal insufficiency.  Normal albumin at 4.2.  She is on chronic Eliquis for atrial fibrillation.  Patient also has a history of GERD complicated by peptic stricture for which she underwent upper endoscopy with esophageal dilation with myself December 2017.  REVIEW OF SYSTEMS:  All non-GI ROS negative except for anxiety  Past Medical History:  Diagnosis Date  . Allergic rhinitis   . Anxiety   . Atrial septal aneurysm   . Basal cell carcinoma   . Bradycardia   . Cataract    bil cataracts removed  . Chronic combined systolic and diastolic CHF (congestive heart failure) ([place])   . CKD (chronic kidney disease), stage III ([place])   . Clostridium difficile infection   . COPD (chronic obstructive pulmonary  disease) ([place])   . Depression 11/29/2013  . DI (detrusor instability)   . Esophageal stricture   . GERD (gastroesophageal reflux disease)   . Hemorrhoids   . Hiatal hernia   . History of kidney stones   . HLD (hyperlipidemia)   . HTN (hypertension)   . Ischemic colon ([place])    a. 08/2016 - adm to  [facility] with a ruptured colon s/p right hemicolectomy with ileostomy formation for ischemic and necrotic colon.   . Lung nodule    a. followed by pulmonology  . Mild CAD    a. minor nonobstructive CAD 08/22/16.  . Nephrolithiasis    hx  . NICM (nonischemic cardiomyopathy) ([place])   . Orthostatic hypotension   . Osteoarthritis   . PAF (paroxysmal atrial fibrillation) ([place])    a. diagnosed 07/2016 s/p DCCV 08/01/16 with recurrence, managed with rate control for now.   . Pneumonia 2013  . PVC's (premature ventricular contractions)   . Shingles since Nov 18, 2013   on right arm from shoulder to wrist  . Status post dilation of esophageal narrowing   . Stroke [facility]) 2001   STROKES, TIA'S  . Syncope    a. 08/2016 felt due to orthostatic hypotension.  Marland Kitchen TIA (transient ischemic attack) last 2001   anticoagulation therapy on coumadin    Past Surgical History:  Procedure Laterality Date  . ANTERIOR LATERAL LUMBAR FUSION 4 LEVELS Right 07/03/2015   Procedure: Right Lumbar one-two, Lumbar two-three, Lumbar three-four, Lumbar four-five Anterior lateral lumbar interbody fusion;  Surgeon: Erline Levine, MD;  Location: [place] NEURO ORS;  Service: Neurosurgery;  Laterality: Right;  Right L1-2 L2-3 L3-4 L4-5 Anterior lateral lumbar interbody fusion  . BALLOON DILATION N/A 03/03/2014   Procedure: BALLOON DILATION;  Surgeon: Inda Castle, MD;  [facility]: [facility] ENDOSCOPY;  Service: Endoscopy;  Laterality: N/A;  . BLADDER SUSPENSION    . BREAST BIOPSY    . CARDIAC CATHETERIZATION N/A 08/22/2016   Procedure: Left Heart Cath and Coronary Angiography;  Surgeon: Peter M Martinique, MD;  Location: [place] CV LAB;   Service: Cardiovascular;  Laterality: N/A;  . CARDIOVERSION N/A 08/01/2016   Procedure: CARDIOVERSION;  Surgeon: Jerline Pain, MD;  Location: [place];  Service: Cardiovascular;  Laterality: N/A;  . CARDIOVERSION N/A 10/11/2016   Procedure: CARDIOVERSION;  Surgeon: Sueanne Margarita, MD;  Location: [facility] ENDOSCOPY;  Service: Cardiovascular;  Laterality: N/A;  . COLONOSCOPY    . ESOPHAGOGASTRODUODENOSCOPY N/A 03/03/2014   Procedure: ESOPHAGOGASTRODUODENOSCOPY (EGD);  Surgeon: Inda Castle, MD;  Location: [facility];  Service: Endoscopy;  Laterality: N/A;  . EXCISION OF BASAL CELL CA  2011  . FINGER SURGERY    . GLUTEUS MINIMUS REPAIR Right 03/15/2016   Procedure: RIGHT HIP GLUTEUS MEDIUS TENDON REPAIR;  Surgeon: Paralee Cancel, MD;  Location: [facility] ORS;  Service: Orthopedics;  Laterality: Right;  . ileostomy reversal    . LUMBAR PERCUTANEOUS PEDICLE SCREW 4 LEVEL Right 07/03/2015   Procedure: Lumbar one-Sacral one Bilateral percutaneous pedicle screws;  Surgeon: Erline Levine, MD;  Location: [place] NEURO ORS;  Service: Neurosurgery;  Laterality: Right;  L1-S1 Bilateral percutaneous pedicle screws  . RECTOCELE REPAIR  2008   A&P REPAIR with vaginal repair-DR. MCDIARMID  . THUMB SURGERY Right 10-15 yrs ago  . UPPER GASTROINTESTINAL ENDOSCOPY    . VAGINAL HYSTERECTOMY  1983   NO BSO-DR. MABRY  . VARICOSE VEIN SURGERY      Social History [name]  reports that she quit smoking about 22 years ago. Her smoking use included cigarettes. She started smoking about 64 years ago. She has a 126.00 pack-year smoking history. She has never used smokeless tobacco. She reports that she does not drink alcohol or use drugs.  family history includes Breast cancer in her maternal aunt, maternal aunt, maternal aunt, and mother; COPD in her mother; Emphysema in her mother; Uterine cancer in her mother.  Allergies  Allergen Reactions  . Clarithromycin Other (See Comments)    REACTION: possible rash but could have  been legionarres dz with rash  . Lipitor [Atorvastatin] Other (See Comments)    MUSCLE SPASMS  . Simvastatin Other (See Comments)    Muscle and joint pain  . Cardizem [Diltiazem Hcl] Hives  . Contrast Media [Iodinated Diagnostic Agents]     FYI - during admission at [facility] 08/2016 patient developed hives, question related to cardizem or contrast dye - not clear  . Crestor [Rosuvastatin Calcium]     fagitue and joint pain, "NO STATINS"  . Rosuvastatin Calcium Other (See Comments)    fagitue and joint pain, "NO STATINS"       PHYSICAL EXAMINATION: Vital signs: BP (!) 110/56   Pulse 60 Comment: irregularly irregular  Ht 5\' 4"  (1.626 m)   Wt 145 lb (65.8 kg)   BMI 24.89 kg/m   Constitutional: generally well-appearing, no acute distress Psychiatric: alert and oriented x3, cooperative Eyes: extraocular movements intact, anicteric, conjunctiva pink Mouth: oral pharynx moist, no lesions Neck: supple no lymphadenopathy Cardiovascular: heart regular rate and rhythm, no murmur Lungs: clear to auscultation bilaterally Abdomen: soft, nontender, nondistended, no obvious ascites,  no peritoneal signs, normal bowel sounds, no organomegaly.  Multiple surgical incisions healed Rectal: Omitted Extremities: no clubbing, cyanosis, or lower extremity edema bilaterally Skin: no lesions on visible extremities Neuro: No focal deficits.  Cranial nerves intact  ASSESSMENT:  1.  Problems with chronic diarrhea and increased intestinal gas as described.  This post right hemicolectomy with temporary ileostomy and subsequent reversal.  Suspect bile salt related diarrhea and/or bacterial overgrowth syndrome 2.  Colonoscopy 2014 with diverticulosis 3.  Multiple significant medical problems   PLAN:  1.  Colestid 1 g twice daily.  Do not take within 2 hours of other medications.  Instructed.  Prescribed.  If not working after 2 weeks increase to 2 g twice daily 2.  Prescribed metronidazole 250 mg  3 times daily for 2 weeks. 3.  GI office follow-up 6 weeks  25-minute spent face-to-face with the patient.  Greater than 50% of the time used for counseling regarding her chronic diarrhea and increased intestinal gas

## 2018-09-21 ENCOUNTER — Other Ambulatory Visit: Payer: Self-pay

## 2018-09-21 ENCOUNTER — Encounter: Payer: Self-pay | Admitting: Internal Medicine

## 2018-09-21 MED ORDER — LEVOTHYROXINE SODIUM 25 MCG PO TABS: 25.0000 ug | ORAL_TABLET | Freq: Every day | ORAL | 3 refills | Status: DC

## 2018-09-21 MED ORDER — GABAPENTIN 300 MG PO CAPS: ORAL_CAPSULE | ORAL | 1 refills | Status: DC

## 2018-09-21 MED ORDER — ALBUTEROL SULFATE HFA 108 (90 BASE) MCG/ACT IN AERS: 1.0000 | INHALATION_SPRAY | Freq: Four times a day (QID) | RESPIRATORY_TRACT | 4 refills | Status: DC | PRN

## 2018-09-24 ENCOUNTER — Encounter: Payer: Self-pay | Admitting: Internal Medicine

## 2018-09-24 MED ORDER — FUROSEMIDE 20 MG PO TABS: 20.0000 mg | ORAL_TABLET | ORAL | 0 refills | Status: DC | PRN

## 2018-09-28 ENCOUNTER — Encounter: Payer: Self-pay | Admitting: Internal Medicine

## 2018-10-02 ENCOUNTER — Telehealth: Payer: Self-pay

## 2018-10-02 NOTE — Telephone Encounter (Signed)
error 

## 2018-10-17 ENCOUNTER — Ambulatory Visit (INDEPENDENT_AMBULATORY_CARE_PROVIDER_SITE_OTHER): Payer: Medicare Other | Admitting: Internal Medicine

## 2018-10-17 ENCOUNTER — Encounter: Payer: Self-pay | Admitting: Internal Medicine

## 2018-10-17 ENCOUNTER — Other Ambulatory Visit (INDEPENDENT_AMBULATORY_CARE_PROVIDER_SITE_OTHER): Payer: Medicare Other

## 2018-10-17 VITALS — BP 122/70 | HR 80 | Temp 97.6°F | Ht 64.0 in | Wt 144.0 lb

## 2018-10-17 DIAGNOSIS — R739 Hyperglycemia, unspecified: Secondary | ICD-10-CM | POA: Diagnosis not present

## 2018-10-17 DIAGNOSIS — N183 Chronic kidney disease, stage 3 unspecified: Secondary | ICD-10-CM

## 2018-10-17 DIAGNOSIS — E7800 Pure hypercholesterolemia, unspecified: Secondary | ICD-10-CM | POA: Diagnosis not present

## 2018-10-17 DIAGNOSIS — M154 Erosive (osteo)arthritis: Secondary | ICD-10-CM | POA: Diagnosis not present

## 2018-10-17 DIAGNOSIS — J438 Other emphysema: Secondary | ICD-10-CM

## 2018-10-17 LAB — CBC WITH DIFFERENTIAL/PLATELET
Basophils Absolute: 0 10*3/uL (ref 0.0–0.1)
Basophils Relative: 0.7 % (ref 0.0–3.0)
Eosinophils Absolute: 0 10*3/uL (ref 0.0–0.7)
Eosinophils Relative: 0.7 % (ref 0.0–5.0)
HCT: 40.3 % (ref 36.0–46.0)
Hemoglobin: 13.2 g/dL (ref 12.0–15.0)
Lymphocytes Relative: 30.8 % (ref 12.0–46.0)
Lymphs Abs: 2 10*3/uL (ref 0.7–4.0)
MCHC: 32.7 g/dL (ref 30.0–36.0)
MCV: 77.3 fl — ABNORMAL LOW (ref 78.0–100.0)
Monocytes Absolute: 0.8 10*3/uL (ref 0.1–1.0)
Monocytes Relative: 13 % — ABNORMAL HIGH (ref 3.0–12.0)
Neutro Abs: 3.5 10*3/uL (ref 1.4–7.7)
Neutrophils Relative %: 54.8 % (ref 43.0–77.0)
Platelets: 198 10*3/uL (ref 150.0–400.0)
RBC: 5.21 Mil/uL — ABNORMAL HIGH (ref 3.87–5.11)
RDW: 18.5 % — ABNORMAL HIGH (ref 11.5–15.5)
WBC: 6.4 10*3/uL (ref 4.0–10.5)

## 2018-10-17 LAB — URINALYSIS, ROUTINE W REFLEX MICROSCOPIC
Bilirubin Urine: NEGATIVE
Hgb urine dipstick: NEGATIVE
Ketones, ur: NEGATIVE
Leukocytes, UA: NEGATIVE
Nitrite: NEGATIVE
RBC / HPF: NONE SEEN (ref 0–?)
Specific Gravity, Urine: 1.01 (ref 1.000–1.030)
Total Protein, Urine: NEGATIVE
Urine Glucose: NEGATIVE
Urobilinogen, UA: 0.2 (ref 0.0–1.0)
WBC, UA: NONE SEEN (ref 0–?)
pH: 5.5 (ref 5.0–8.0)

## 2018-10-17 LAB — BASIC METABOLIC PANEL
BUN: 23 mg/dL (ref 6–23)
CO2: 32 mEq/L (ref 19–32)
Calcium: 9.9 mg/dL (ref 8.4–10.5)
Chloride: 103 mEq/L (ref 96–112)
Creatinine, Ser: 1.03 mg/dL (ref 0.40–1.20)
GFR: 54.91 mL/min — ABNORMAL LOW (ref >60.00)
Glucose, Bld: 98 mg/dL (ref 70–99)
Potassium: 3.8 mEq/L (ref 3.5–5.1)
Sodium: 141 mEq/L (ref 135–145)

## 2018-10-17 LAB — HEPATIC FUNCTION PANEL
ALT: 15 U/L (ref 0–35)
AST: 20 U/L (ref 0–37)
Albumin: 4.3 g/dL (ref 3.5–5.2)
Alkaline Phosphatase: 100 U/L (ref 39–117)
Bilirubin, Direct: 0.1 mg/dL (ref 0.0–0.3)
Total Bilirubin: 0.4 mg/dL (ref 0.2–1.2)
Total Protein: 7 g/dL (ref 6.0–8.3)

## 2018-10-17 LAB — LDL CHOLESTEROL, DIRECT: Direct LDL: 133 mg/dL

## 2018-10-17 LAB — HEMOGLOBIN A1C: Hgb A1c MFr Bld: 5.8 % (ref 4.6–6.5)

## 2018-10-17 LAB — LIPID PANEL
Cholesterol: 212 mg/dL — ABNORMAL HIGH (ref 0–200)
HDL: 55 mg/dL (ref >39.00)
NonHDL: 156.68
Total CHOL/HDL Ratio: 4
Triglycerides: 341 mg/dL — ABNORMAL HIGH (ref 0.0–149.0)
VLDL: 68.2 mg/dL — ABNORMAL HIGH (ref 0.0–40.0)

## 2018-10-17 LAB — TSH: TSH: 1.07 u[IU]/mL (ref 0.35–4.50)

## 2018-10-17 MED ORDER — FUROSEMIDE 20 MG PO TABS: 20.0000 mg | ORAL_TABLET | ORAL | 3 refills | Status: DC | PRN

## 2018-10-17 MED ORDER — GABAPENTIN 300 MG PO CAPS: ORAL_CAPSULE | ORAL | 1 refills | Status: DC

## 2018-10-17 MED ORDER — BUPROPION HCL ER (XL) 300 MG PO TB24: 300.0000 mg | ORAL_TABLET | Freq: Every day | ORAL | 3 refills | Status: DC

## 2018-10-17 MED ORDER — EZETIMIBE 10 MG PO TABS: 10.0000 mg | ORAL_TABLET | Freq: Every day | ORAL | 3 refills | Status: DC

## 2018-10-17 MED ORDER — CYCLOBENZAPRINE HCL 5 MG PO TABS: 5.0000 mg | ORAL_TABLET | Freq: Three times a day (TID) | ORAL | 1 refills | Status: AC | PRN

## 2018-10-17 NOTE — Patient Instructions (Signed)

## 2018-10-17 NOTE — Assessment & Plan Note (Signed)
stable overall by history and exam, recent data reviewed with pt, and pt to continue medical treatment as before,  to f/u any worsening symptoms or concerns  

## 2018-10-17 NOTE — Progress Notes (Signed)
Subjective:    Patient ID: [name], female    DOB: 1941/09/15, 77 y.o.   MRN: [id number]  HPI  Here to f/u; overall doing ok,  Pt denies chest pain, increasing sob or doe, wheezing, orthopnea, PND, increased LE swelling, palpitations, dizziness or syncope.  Pt denies new neurological symptoms such as new headache, or facial or extremity weakness or numbness.  Pt denies polydipsia, polyuria, or low sugar episode.  Pt states overall good compliance with meds, mostly trying to follow appropriate diet, with wt overall stable,  but little exercise however.  Still having some difficulty getting to sleep - takes 2 gabapentin, trazodone, melatonin and hemp and needs all that nightly.  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  No new complaints Past Medical History:  Diagnosis Date  . Allergic rhinitis   . Anxiety   . Atrial septal aneurysm   . Basal cell carcinoma   . Bradycardia   . Cataract    bil cataracts removed  . Chronic combined systolic and diastolic CHF (congestive heart failure) ([place])   . CKD (chronic kidney disease), stage III ([place])   . Clostridium difficile infection   . COPD (chronic obstructive pulmonary disease) ([place])   . Depression 11/29/2013  . DI (detrusor instability)   . Esophageal stricture   . GERD (gastroesophageal reflux disease)   . Hemorrhoids   . Hiatal hernia   . History of kidney stones   . HLD (hyperlipidemia)   . HTN (hypertension)   . Ischemic colon ([place])    a. 08/2016 - adm to  [facility] with a ruptured colon s/p right hemicolectomy with ileostomy formation for ischemic and necrotic colon.   . Lung nodule    a. followed by pulmonology  . Mild CAD    a. minor nonobstructive CAD 08/22/16.  . Nephrolithiasis    hx  . NICM (nonischemic cardiomyopathy) ([place])   . Orthostatic hypotension   . Osteoarthritis   . PAF (paroxysmal atrial fibrillation) ([place])    a. diagnosed 07/2016 s/p DCCV 08/01/16 with recurrence, managed with  rate control for now.   . Pneumonia 2013  . PVC's (premature ventricular contractions)   . Shingles since Nov 18, 2013   on right arm from shoulder to wrist  . Status post dilation of esophageal narrowing   . Stroke [facility]) 2001   STROKES, TIA'S  . Syncope    a. 08/2016 felt due to orthostatic hypotension.  Marland Kitchen TIA (transient ischemic attack) last 2001   anticoagulation therapy on coumadin   Past Surgical History:  Procedure Laterality Date  . ANTERIOR LATERAL LUMBAR FUSION 4 LEVELS Right 07/03/2015   Procedure: Right Lumbar one-two, Lumbar two-three, Lumbar three-four, Lumbar four-five Anterior lateral lumbar interbody fusion;  Surgeon: Erline Levine, MD;  Location: [place] NEURO ORS;  Service: Neurosurgery;  Laterality: Right;  Right L1-2 L2-3 L3-4 L4-5 Anterior lateral lumbar interbody fusion  . BALLOON DILATION N/A 03/03/2014   Procedure: BALLOON DILATION;  Surgeon: Inda Castle, MD;  [facility]OS[facility];  Service: Endoscopy;  Laterality: N/A;  . BLADDER SUSPENSION    . BREAST BIOPSY    . CARDIAC CATHETERIZATION N/A 08/22/2016   Procedure: Left Heart Cath and Coronary Angiography;  Surgeon: Peter M Martinique, MD;  Location: [place] CV LAB;  Service: Cardiovascular;  Laterality: N/A;  . CARDIOVERSION N/A 08/01/2016   Procedure: CARDIOVERSION;  Surgeon: Jerline Pain, MD;  Location: [facility] ENDOSCOPY;  Service: Cardiovascular;  Laterality: N/A;  . CARDIOVERSION N/A 10/11/2016  Procedure: CARDIOVERSION;  Surgeon: Sueanne Margarita, MD;  Location: [facility] ENDOSCOPY;  Service: Cardiovascular;  Laterality: N/A;  . COLONOSCOPY    . ESOPHAGOGASTRODUODENOSCOPY N/A 03/03/2014   Procedure: ESOPHAGOGASTRODUODENOSCOPY (EGD);  Surgeon: Inda Castle, MD;  Location: [facility];  Service: Endoscopy;  Laterality: N/A;  . EXCISION OF BASAL CELL CA  2011  . FINGER SURGERY    . GLUTEUS MINIMUS REPAIR Right 03/15/2016   Procedure: RIGHT HIP GLUTEUS MEDIUS TENDON REPAIR;  Surgeon: Paralee Cancel, MD;  [facility]S;   Service: Orthopedics;  Laterality: Right;  . ileostomy reversal    . LUMBAR PERCUTANEOUS PEDICLE SCREW 4 LEVEL Right 07/03/2015   Procedure: Lumbar one-Sacral one Bilateral percutaneous pedicle screws;  Surgeon: Erline Levine, MD;  Location: [place] NEURO ORS;  Service: Neurosurgery;  Laterality: Right;  L1-S1 Bilateral percutaneous pedicle screws  . RECTOCELE REPAIR  2008   A&P REPAIR with vaginal repair-DR. MCDIARMID  . THUMB SURGERY Right 10-15 yrs ago  . UPPER GASTROINTESTINAL ENDOSCOPY    . VAGINAL HYSTERECTOMY  1983   NO BSO-DR. MABRY  . VARICOSE VEIN SURGERY      reports that she quit smoking about 22 years ago. Her smoking use included cigarettes. She started smoking about 64 years ago. She has a 126.00 pack-year smoking history. She has never used smokeless tobacco. She reports that she does not drink alcohol or use drugs. family history includes Breast cancer in her maternal aunt, maternal aunt, maternal aunt, and mother; COPD in her mother; Emphysema in her mother; Uterine cancer in her mother. Allergies  Allergen Reactions  . Clarithromycin Other (See Comments)    REACTION: possible rash but could have been legionarres dz with rash  . Lipitor [Atorvastatin] Other (See Comments)    MUSCLE SPASMS  . Simvastatin Other (See Comments)    Muscle and joint pain  . Cardizem [Diltiazem Hcl] Hives  . Contrast Media [Iodinated Diagnostic Agents]     FYI - during admission at [facility] 08/2016 patient developed hives, question related to cardizem or contrast dye - not clear  . Crestor [Rosuvastatin Calcium]     fagitue and joint pain, "NO STATINS"  . Rosuvastatin Calcium Other (See Comments)    fagitue and joint pain, "NO STATINS"   Current Outpatient Medications on File Prior to Visit  Medication Sig Dispense Refill  . albuterol (PROVENTIL HFA;VENTOLIN HFA) 108 (90 Base) MCG/ACT inhaler Inhale 1-2 puffs into the lungs every 6 (six) hours as needed for wheezing or shortness of breath.  3 Inhaler 4  . apixaban (ELIQUIS) 5 MG TABS tablet Take 1 tablet (5 mg total) by mouth 2 (two) times daily. 180 tablet 1  . azelastine (ASTELIN) 0.1 % nasal spray Place 2 sprays into the nose daily. Use in each nostril as directed 90 mL 1  . Calcium Carbonate-Vitamin D (CALCIUM + D PO) Take 1 tablet by mouth 2 (two) times daily.     . colestipol (COLESTID) 1 g tablet Take 2 tablets (2 g total) by mouth 2 (two) times daily. 120 tablet 1  . diclofenac sodium (VOLTAREN) 1 % GEL Apply 4 g topically 4 (four) times daily as needed. 400 g 3  . fluticasone (FLONASE) 50 MCG/ACT nasal spray Place 1 spray into both nostrils 2 (two) times daily. 48 g 1  . Fluticasone-Umeclidin-Vilant (TRELEGY ELLIPTA) 100-62.5-25 MCG/INH AEPB Inhale into the lungs. I inhalation once daily    . guaiFENesin (MUCINEX) 600 MG 12 hr tablet Take 1,200 mg by mouth 2 (two) times  daily as needed for cough or to loosen phlegm.    Marland Kitchen ipratropium (ATROVENT) 0.03 % nasal spray Place 2 sprays into both nostrils 3 (three) times daily as needed for rhinitis. 30 mL 12  . levothyroxine (SYNTHROID, LEVOTHROID) 25 MCG tablet Take 1 tablet (25 mcg total) by mouth daily before breakfast. 90 tablet 3  . Lidocaine HCl 2 % CREA Apply 1 application topically daily as needed (for shingles flares).    . Melatonin 5 MG TABS Take 5 mg by mouth at bedtime.    . mirabegron ER (MYRBETRIQ) 50 MG TB24 tablet Take 50 mg by mouth daily.    . montelukast (SINGULAIR) 10 MG tablet Take 1 tablet (10 mg total) by mouth daily. 90 tablet 3  . Multiple Vitamin (MULTIVITAMIN) tablet Take 1 tablet by mouth daily.      Marland Kitchen nystatin (MYCOSTATIN) 100000 UNIT/ML suspension Take 5 cc by mouth, swish and swallow 4 times daily. 280 mL 0  . omeprazole (PRILOSEC) 40 MG capsule Take 1 capsule (40 mg total) by mouth daily. 90 capsule 1  . Probiotic Product (PROBIOTIC DAILY PO) Take by mouth.    . Respiratory Therapy Supplies (FLUTTER) DEVI Use 2-3 times per day 1 each 0  . traZODone  (DESYREL) 50 MG tablet Take 0.5-1 tablets (25-50 mg total) by mouth at bedtime as needed for sleep. 90 tablet 1  . metoprolol succinate (TOPROL-XL) 100 MG 24 hr tablet Take 1 tablet (100 mg total) by mouth daily. Take with or immediately following a meal. 90 tablet 2   No current facility-administered medications on file prior to visit.    ROS:  Constitutional: Negative for other unusual diaphoresis or sweats HENT: Negative for ear discharge or swelling Eyes: Negative for other worsening visual disturbances Respiratory: Negative for stridor or other swelling  Gastrointestinal: Negative for worsening distension or other blood Genitourinary: Negative for retention or other urinary change Musculoskeletal: Negative for other MSK pain or swelling Skin: Negative for color change or other new lesions Neurological: Negative for worsening tremors and other numbness  Psychiatric/Behavioral: Negative for worsening agitation or other fatigue All other system neg per pt    Objective:   Physical Exam BP 122/70   Pulse 80   Temp 97.6 F (36.4 C) (Oral)   Ht 5\' 4"  (1.626 m)   Wt 144 lb (65.3 kg)   SpO2 93%   BMI 24.72 kg/m  VS noted,  Constitutional: Pt appears in NAD HENT: Head: NCAT.  Right Ear: External ear normal.  Left Ear: External ear normal.  Eyes: . Pupils are equal, round, and reactive to light. Conjunctivae and EOM are normal Nose: without d/c or deformity Neck: Neck supple. Gross normal ROM Cardiovascular: Normal rate and regular rhythm.   Pulmonary/Chest: Effort normal and breath sounds decreased without rales or wheezing.  Abd:  Soft, NT, ND, + BS, no organomegaly Neurological: Pt is alert. At baseline orientation, motor grossly intact Skin: Skin is warm. No rashes, other new lesions, no LE edema Psychiatric: Pt behavior is normal without agitation  No other exam findings Lab Results  Component Value Date   WBC 6.4 10/17/2018   HGB 13.2 10/17/2018   HCT 40.3 10/17/2018     PLT 198.0 10/17/2018   GLUCOSE 98 10/17/2018   CHOL 212 (H) 10/17/2018   TRIG 341.0 (H) 10/17/2018   HDL 55.00 10/17/2018   LDLDIRECT 133.0 10/17/2018   LDLCALC 165 (H) 11/12/2015   ALT 15 10/17/2018   AST 20 10/17/2018   NA  141 10/17/2018   K 3.8 10/17/2018   CL 103 10/17/2018   CREATININE 1.03 10/17/2018   BUN 23 10/17/2018   CO2 32 10/17/2018   TSH 1.07 10/17/2018   INR 1.21 08/17/2016   HGBA1C 5.8 10/17/2018       Assessment & Plan:

## 2018-10-17 NOTE — Assessment & Plan Note (Signed)
Cont same tx, stable overall by history and exam, recent data reviewed with pt, and pt to continue medical treatment as before,  to f/u any worsening symptoms or concerns

## 2018-10-24 ENCOUNTER — Encounter: Payer: Self-pay | Admitting: Internal Medicine

## 2018-10-24 ENCOUNTER — Ambulatory Visit (INDEPENDENT_AMBULATORY_CARE_PROVIDER_SITE_OTHER): Payer: Medicare Other | Admitting: Internal Medicine

## 2018-10-24 VITALS — BP 120/58 | HR 68 | Ht 64.0 in | Wt 143.4 lb

## 2018-10-24 DIAGNOSIS — R140 Abdominal distension (gaseous): Secondary | ICD-10-CM | POA: Diagnosis not present

## 2018-10-24 DIAGNOSIS — R197 Diarrhea, unspecified: Secondary | ICD-10-CM | POA: Diagnosis not present

## 2018-10-24 DIAGNOSIS — R143 Flatulence: Secondary | ICD-10-CM | POA: Diagnosis not present

## 2018-10-24 MED ORDER — METRONIDAZOLE 250 MG PO TABS: 250.0000 mg | ORAL_TABLET | Freq: Three times a day (TID) | ORAL | 0 refills | Status: DC

## 2018-10-24 NOTE — Progress Notes (Signed)
HISTORY OF PRESENT ILLNESS:  [name] is a 77 y.o. female with multiple significant medical problems who was last evaluated September 20, 2018 regarding chronic diarrhea and increased intestinal gas.  Patient is status post right hemicolectomy with temporary ileostomy and subsequent reversal.  She was treated with Colestid and metronidazole.  She follows up as requested.  Currently taking Colestid 2 g twice daily.  She has had improvement in her bowel habits in terms of frequency and form, though not as well as she would like.  She does tell me that metronidazole did seem to help her problems with intestinal gas the this returned at some point in recent weeks.  No new problems.  She does tell me that over-the-counter antidiarrheals are helpful though she uses these sparingly.  GI review of systems is otherwise negative  REVIEW OF SYSTEMS:  All non-GI ROS negative except for sinus and allergy, anxiety, arthritis, back pain, heart rhythm change, visual change, depression, sleeping problems, ankle swelling  Past Medical History:  Diagnosis Date  . Allergic rhinitis   . Anxiety   . Atrial septal aneurysm   . Basal cell carcinoma   . Bradycardia   . Cataract    bil cataracts removed  . Chronic combined systolic and diastolic CHF (congestive heart failure) ([place])   . CKD (chronic kidney disease), stage III ([place])   . Clostridium difficile infection   . COPD (chronic obstructive pulmonary disease) ([place])   . Depression 11/29/2013  . DI (detrusor instability)   . Esophageal stricture   . GERD (gastroesophageal reflux disease)   . Hemorrhoids   . Hiatal hernia   . History of kidney stones   . HLD (hyperlipidemia)   . HTN (hypertension)   . Ischemic colon ([place])    a. 08/2016 - adm to  [facility] with a ruptured colon s/p right hemicolectomy with ileostomy formation for ischemic and necrotic colon.   . Lung nodule    a. followed by pulmonology  . Mild CAD    a. minor nonobstructive CAD  08/22/16.  . Nephrolithiasis    hx  . NICM (nonischemic cardiomyopathy) ([place])   . Orthostatic hypotension   . Osteoarthritis   . PAF (paroxysmal atrial fibrillation) ([place])    a. diagnosed 07/2016 s/p DCCV 08/01/16 with recurrence, managed with rate control for now.   . Pneumonia 2013  . PVC's (premature ventricular contractions)   . Shingles since Nov 18, 2013   on right arm from shoulder to wrist  . Status post dilation of esophageal narrowing   . Stroke [facility]) 2001   STROKES, TIA'S  . Syncope    a. 08/2016 felt due to orthostatic hypotension.  Marland Kitchen TIA (transient ischemic attack) last 2001   anticoagulation therapy on coumadin    Past Surgical History:  Procedure Laterality Date  . ANTERIOR LATERAL LUMBAR FUSION 4 LEVELS Right 07/03/2015   Procedure: Right Lumbar one-two, Lumbar two-three, Lumbar three-four, Lumbar four-five Anterior lateral lumbar interbody fusion;  Surgeon: Erline Levine, MD;  Location: [place] NEURO ORS;  Service: Neurosurgery;  Laterality: Right;  Right L1-2 L2-3 L3-4 L4-5 Anterior lateral lumbar interbody fusion  . BALLOON DILATION N/A 03/03/2014   Procedure: BALLOON DILATION;  Surgeon: Inda Castle, MD;  [facility]: [facility] END[facility];  Service: Endoscopy;  Laterality: N/A;  . BLADDER SUSPENSION    . BREAST BIOPSY    . CARDIAC CATHETERIZATION N/A 08/22/2016   Procedure: Left Heart Cath and Coronary Angiography;  Surgeon: Peter M Martinique, MD;  Location[facility]  INVASIVE CV LAB;  Service: Cardiovascular;  Laterality: N/A;  . CARDIOVERSION N/A 08/01/2016   Procedure: CARDIOVERSION;  Surgeon: Jerline Pain, MD;  Location: [place];  Service: Cardiovascular;  Laterality: N/A;  . CARDIOVERSION N/A 10/11/2016   Procedure: CARDIOVERSION;  Surgeon: Sueanne Margarita, MD;  [facility] ENDOS[facility];  Service: Cardiovascular;  Laterality: N/A;  . COLONOSCOPY    . ESOPHAGOGASTRODUODENOSCOPY N/A 03/03/2014   Procedure: ESOPHAGOGASTRODUODENOSCOPY (EGD);  Surgeon: Inda Castle, MD;  Location: [facility]  ENDOSCOPY;  Service: Endoscopy;  Laterality: N/A;  . EXCISION OF BASAL CELL CA  2011  . FINGER SURGERY    . GLUTEUS MINIMUS REPAIR Right 03/15/2016   Procedure: RIGHT HIP GLUTEUS MEDIUS TENDON REPAIR;  Surgeon: Paralee Cancel, MD;  [facility] ORS;  Service: Orthopedics;  Laterality: Right;  . ileostomy reversal    . LUMBAR PERCUTANEOUS PEDICLE SCREW 4 LEVEL Right 07/03/2015   Procedure: Lumbar one-Sacral one Bilateral percutaneous pedicle screws;  Surgeon: Erline Levine, MD;  Location: [place] NEURO ORS;  Service: Neurosurgery;  Laterality: Right;  L1-S1 Bilateral percutaneous pedicle screws  . RECTOCELE REPAIR  2008   A&P REPAIR with vaginal repair-DR. MCDIARMID  . THUMB SURGERY Right 10-15 yrs ago  . UPPER GASTROINTESTINAL ENDOSCOPY    . VAGINAL HYSTERECTOMY  1983   NO BSO-DR. MABRY  . VARICOSE VEIN SURGERY      Social History [name]  reports that she quit smoking about 22 years ago. Her smoking use included cigarettes. She started smoking about 64 years ago. She has a 126.00 pack-year smoking history. She has never used smokeless tobacco. She reports that she does not drink alcohol or use drugs.  family history includes Breast cancer in her maternal aunt, maternal aunt, maternal aunt, and mother; COPD in her mother; Emphysema in her mother; Uterine cancer in her mother.  Allergies  Allergen Reactions  . Clarithromycin Other (See Comments)    REACTION: possible rash but could have been legionarres dz with rash  . Lipitor [Atorvastatin] Other (See Comments)    MUSCLE SPASMS  . Simvastatin Other (See Comments)    Muscle and joint pain  . Cardizem [Diltiazem Hcl] Hives  . Contrast Media [Iodinated Diagnostic Agents]     FYI - during admission at [facility] 08/2016 patient developed hives, question related to cardizem or contrast dye - not clear  . Crestor [Rosuvastatin Calcium]     fagitue and joint pain, "NO STATINS"  . Rosuvastatin Calcium Other (See Comments)    fagitue  and joint pain, "NO STATINS"       PHYSICAL EXAMINATION: Vital signs: BP (!) 120/58   Pulse 68   Ht 5\' 4"  (1.626 m)   Wt 143 lb 6 oz (65 kg)   BMI 24.61 kg/m   Constitutional: generally well-appearing, no acute distress Psychiatric: alert and oriented x3, cooperative Eyes: extraocular movements intact, anicteric, conjunctiva pink Mouth: oral pharynx moist, no lesions Neck: supple no lymphadenopathy Cardiovascular: heart regular rate and rhythm, no murmur Lungs: clear to auscultation bilaterally Abdomen: soft, nontender, nondistended, no obvious ascites, no peritoneal signs, normal bowel sounds, no organomegaly Rectal: Omitted Extremities: no clubbing, cyanosis, lower extremity edema bilaterally Skin: no lesions on visible extremities Neuro: No focal deficits.   ASSESSMENT:  1.  Chronic diarrhea and increased intestinal gas post right hemicolectomy with temporary ileostomy and subsequent reversal.   PLAN:  1.  Continue Colestid 2.  Represcribed metronidazole 250 mg 3 times daily for 4 weeks 3.  Advised to use Imodium on  a regular basis.  Start with once daily.  May increase as needed.  Hold for constipation 4.  GI follow-up 6 to 8 weeks  25 minutes spent face-to-face with the patient.  Greater than 50% the time used for counseling regarding management of her chronic diarrhea and follow-up recommendations

## 2018-10-24 NOTE — Patient Instructions (Addendum)
We have sent the following medications to your pharmacy for you to pick up at your convenience:  Flagyl  You may take over the counter Imodium daily, increase as needed.  Please follow up in six to eight weeks.

## 2018-10-25 ENCOUNTER — Other Ambulatory Visit: Payer: Self-pay | Admitting: Internal Medicine

## 2018-10-25 MED ORDER — FUROSEMIDE 20 MG PO TABS: 20.0000 mg | ORAL_TABLET | Freq: Every day | ORAL | 3 refills | Status: DC | PRN

## 2018-11-05 ENCOUNTER — Telehealth: Payer: Self-pay

## 2018-11-05 MED ORDER — TRAZODONE HCL 50 MG PO TABS: 25.0000 mg | ORAL_TABLET | Freq: Every evening | ORAL | 1 refills | Status: DC | PRN

## 2018-11-05 NOTE — Telephone Encounter (Signed)
Copied from [place] [id number]. Topic: General - Other >> Nov 02, 2018 11:46 AM Janace Aris A wrote: Reason for CRM: Pt called in wanting to advise Dr. Jenny Reichmann that the prescription for the traZODone (DESYREL) 50 MG was not sent into the pharmacy like they discussed. She says she will be out of the medication on Friday and she would like the medication called in for her.

## 2018-11-08 ENCOUNTER — Other Ambulatory Visit: Payer: Self-pay | Admitting: Internal Medicine

## 2018-11-08 MED ORDER — TRAZODONE HCL 50 MG PO TABS: 25.0000 mg | ORAL_TABLET | Freq: Every evening | ORAL | 0 refills | Status: DC | PRN

## 2018-11-08 NOTE — Telephone Encounter (Signed)
Copied from [place] [id number]. Topic: General - Other >> Nov 08, 2018 10:21 AM Lennox Solders wrote: Reason for CRM: pt is calling and needs #30 trazodone 50 mg sent to [facility] in [place]. Pt is waiting on mailorder

## 2018-11-16 ENCOUNTER — Encounter: Payer: Self-pay | Admitting: Internal Medicine

## 2018-11-16 DIAGNOSIS — K2100 Gastro-esophageal reflux disease with esophagitis, without bleeding: Secondary | ICD-10-CM

## 2018-11-16 DIAGNOSIS — K222 Esophageal obstruction: Secondary | ICD-10-CM

## 2018-11-16 MED ORDER — OMEPRAZOLE 40 MG PO CPDR: 40.0000 mg | DELAYED_RELEASE_CAPSULE | Freq: Every day | ORAL | 1 refills | Status: DC

## 2018-12-04 ENCOUNTER — Ambulatory Visit (INDEPENDENT_AMBULATORY_CARE_PROVIDER_SITE_OTHER): Payer: Medicare Other | Admitting: Cardiology

## 2018-12-04 ENCOUNTER — Encounter: Payer: Self-pay | Admitting: Cardiology

## 2018-12-04 VITALS — BP 124/70 | HR 74 | Ht 64.0 in | Wt 140.0 lb

## 2018-12-04 DIAGNOSIS — I5022 Chronic systolic (congestive) heart failure: Secondary | ICD-10-CM | POA: Diagnosis not present

## 2018-12-04 DIAGNOSIS — I493 Ventricular premature depolarization: Secondary | ICD-10-CM

## 2018-12-04 DIAGNOSIS — I10 Essential (primary) hypertension: Secondary | ICD-10-CM | POA: Diagnosis not present

## 2018-12-04 DIAGNOSIS — I480 Paroxysmal atrial fibrillation: Secondary | ICD-10-CM | POA: Diagnosis not present

## 2018-12-04 MED ORDER — METOPROLOL SUCCINATE ER 100 MG PO TB24: 100.0000 mg | ORAL_TABLET | Freq: Every day | ORAL | 2 refills | Status: DC

## 2018-12-04 NOTE — Patient Instructions (Signed)
Medication Instructions:  Your physician recommends that you continue on your current medications as directed. Please refer to the Current Medication list given to you today.  * If you need a refill on your cardiac medications before your next appointment, please call your pharmacy.   Labwork: None ordered *We will only notify you of abnormal results, otherwise continue current treatment plan.  Testing/Procedures: None ordered  Follow-Up: Your physician wants you to follow-up in: 6 months with Dr. Curt Bears.  You will receive a reminder letter in the mail two months in advance. If you don't receive a letter, please call our office to schedule the follow-up appointment.   Thank you for choosing [facility]!!   [name], RN [phone]  Any Other Special Instructions Will Be Listed Below (If Applicable).

## 2018-12-04 NOTE — Progress Notes (Signed)
Electrophysiology Office Note   Date:  12/04/2018   ID:  [name], [name] 01/01/1941, MRN [id number]  PCP:  Biagio Borg, MD  Primary Electrophysiologist:  Constance Haw, MD    No chief complaint on file.    History of Present Illness: [name] is a 77 y.o. female who presents today for electrophysiology evaluation.   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 Admitted 08/17/16 with ruptured colon status post right hemicolectomy with ileostomy for ischemic and necrotic colon.  Cardioversion 10/11/16. He also had ileostomy takedown and repair that was complicated by C. difficile.  Today, denies symptoms of palpitations, chest pain, shortness of breath, orthopnea, PND, lower extremity edema, claudication, dizziness, presyncope, syncope, bleeding, or neurologic sequela. The patient is tolerating medications without difficulties.  Overall she is doing well.  She has no chest pain or shortness of breath.  She is able to do all of her daily activities.  She started taking her Toprol-XL at night which is greatly improved her fatigue symptoms.  Past Medical History:  Diagnosis Date  . Allergic rhinitis   . Anxiety   . Atrial septal aneurysm   . Basal cell carcinoma   . Bradycardia   . Cataract    bil cataracts removed  . Chronic combined systolic and diastolic CHF (congestive heart failure) ([place])   . CKD (chronic kidney disease), stage III ([place])   . Clostridium difficile infection   . COPD (chronic obstructive pulmonary disease) ([place])   . Depression 11/29/2013  . DI (detrusor instability)   . Esophageal stricture   . GERD (gastroesophageal reflux  disease)   . Hemorrhoids   . Hiatal hernia   . History of kidney stones   . HLD (hyperlipidemia)   . HTN (hypertension)   . Ischemic colon ([place])    a. 08/2016 - adm to  [facility] with a ruptured colon s/p right hemicolectomy with ileostomy formation for ischemic and necrotic colon.   . Lung nodule    a. followed by pulmonology  . Mild CAD    a. minor nonobstructive CAD 08/22/16.  . Nephrolithiasis    hx  . NICM (nonischemic cardiomyopathy) ([place])   . Orthostatic hypotension   . Osteoarthritis   . PAF (paroxysmal atrial fibrillation) ([place])    a. diagnosed 07/2016 s/p DCCV 08/01/16 with recurrence, managed with rate control for now.   . Pneumonia 2013  . PVC's (premature ventricular contractions)   . Shingles since Nov 18, 2013   on right arm from shoulder to wrist  . Status post dilation of esophageal narrowing   . Stroke [facility]) 2001   STROKES, TIA'S  . Syncope    a. 08/2016 felt due to orthostatic hypotension.  Marland Kitchen TIA (transient ischemic attack) last 2001   anticoagulation therapy on coumadin   Past Surgical History:  Procedure Laterality Date  . ANTERIOR LATERAL LUMBAR FUSION 4 LEVELS Right 07/03/2015   Procedure: Right Lumbar one-two, Lumbar two-three, Lumbar three-four, Lumbar four-five Anterior lateral lumbar interbody fusion;  Surgeon: Erline Levine, MD;  Location: [place] NEURO ORS;  Service: Neurosurgery;  Laterality: Right;  Right L1-2 L2-3 L3-4 L4-5 Anterior lateral lumbar interbody fusion  . BALLOON DILATION N/A 03/03/2014   Procedure: BALLOON DILATION;  Surgeon: Inda Castle, MD;  Location: [facility];  Service: Endoscopy;  Laterality: N/A;  . BLADDER SUSPENSION    . BREAST BIOPSY    . CARDIAC CATHETERIZATION N/A 08/22/2016   Procedure: Left Heart Cath and Coronary Angiography;  Surgeon: Peter M Martinique, MD;  Location: [place] CV LAB;  Service: Cardiovascular;  Laterality: N/A;  . CARDIOVERSION N/A 08/01/2016   Procedure: CARDIOVERSION;  Surgeon: Jerline Pain, MD;   Location: [place];  Service: Cardiovascular;  Laterality: N/A;  . CARDIOVERSION N/A 10/11/2016   Procedure: CARDIOVERSION;  Surgeon: Sueanne Margarita, MD;  [facility];  Service: Cardiovascular;  Laterality: N/A;  . COLONOSCOPY    . ESOPHAGOGASTRODUODENOSCOPY N/A 03/03/2014   Procedure: ESOPHAGOGASTRODUODENOSCOPY (EGD);  Surgeon: Inda Castle, MD;  Location: [facility];  Service: Endoscopy;  Laterality: N/A;  . EXCISION OF BASAL CELL CA  2011  . FINGER SURGERY    . GLUTEUS MINIMUS REPAIR Right 03/15/2016   Procedure: RIGHT HIP GLUTEUS MEDIUS TENDON REPAIR;  Surgeon: Paralee Cancel, MD;  [facility];  Service: Orthopedics;  Laterality: Right;  . ileostomy reversal    . LUMBAR PERCUTANEOUS PEDICLE SCREW 4 LEVEL Right 07/03/2015   Procedure: Lumbar one-Sacral one Bilateral percutaneous pedicle screws;  Surgeon: Erline Levine, MD;  Location: [place] NEURO ORS;  Service: Neurosurgery;  Laterality: Right;  L1-S1 Bilateral percutaneous pedicle screws  . RECTOCELE REPAIR  2008   A&P REPAIR with vaginal repair-DR. MCDIARMID  . THUMB SURGERY Right 10-15 yrs ago  . UPPER GASTROINTESTINAL ENDOSCOPY    . VAGINAL HYSTERECTOMY  1983   NO BSO-DR. MABRY  . VARICOSE VEIN SURGERY       Current Outpatient Medications  Medication Sig Dispense Refill  . albuterol (PROVENTIL HFA;VENTOLIN HFA) 108 (90 Base) MCG/ACT inhaler Inhale 1-2 puffs into the lungs every 6 (six) hours as needed for wheezing or shortness of breath. 3 Inhaler 4  . apixaban (ELIQUIS) 5 MG TABS tablet Take 1 tablet (5 mg total) by mouth 2 (two) times daily. 180 tablet 1  . azelastine (ASTELIN) 0.1 % nasal spray Place 2 sprays into the nose daily. Use in each nostril as directed 90 mL 1  . buPROPion (WELLBUTRIN XL) 300 MG 24 hr tablet Take 1 tablet (300 mg total) by mouth daily. 90 tablet 3  . Calcium Carbonate-Vitamin D (CALCIUM + D PO) Take 1 tablet by mouth 2 (two) times daily.     . colestipol (COLESTID) 1 g tablet Take 2  tablets (2 g total) by mouth 2 (two) times daily. 120 tablet 1  . cyclobenzaprine (FLEXERIL) 5 MG tablet Take 1 tablet (5 mg total) by mouth 3 (three) times daily as needed for muscle spasms. 270 tablet 1  . diclofenac sodium (VOLTAREN) 1 % GEL Apply 4 g topically 4 (four) times daily as needed. 400 g 3  . ezetimibe (ZETIA) 10 MG tablet Take 1 tablet (10 mg total) by mouth daily. 90 tablet 3  . fluticasone (FLONASE) 50 MCG/ACT nasal spray Place 1 spray into both nostrils 2 (two) times daily. 48 g 1  . Fluticasone-Umeclidin-Vilant (TRELEGY ELLIPTA) 100-62.5-25 MCG/INH AEPB Inhale into the lungs. I inhalation once daily    . furosemide (LASIX) 20 MG tablet Take 1 tablet (20 mg total) by mouth daily as needed (swelling). 90 tablet 3  . gabapentin (NEURONTIN) 300 MG capsule 1 tab by mouth in AM, 1 po at midday, then 2 by mouth at bedtime for sleep and pain 360 capsule 1  .  guaiFENesin (MUCINEX) 600 MG 12 hr tablet Take 1,200 mg by mouth 2 (two) times daily as needed for cough or to loosen phlegm.    . hydroxychloroquine (PLAQUENIL) 200 MG tablet Take 400 mg by mouth at bedtime.    Marland Kitchen ipratropium (ATROVENT) 0.03 % nasal spray Place 2 sprays into both nostrils 3 (three) times daily as needed for rhinitis. 30 mL 12  . levothyroxine (SYNTHROID, LEVOTHROID) 25 MCG tablet Take 1 tablet (25 mcg total) by mouth daily before breakfast. 90 tablet 3  . Lidocaine HCl 2 % CREA Apply 1 application topically daily as needed (for shingles flares).    . Melatonin 5 MG TABS Take 5 mg by mouth at bedtime.    . metoprolol succinate (TOPROL-XL) 100 MG 24 hr tablet Take 1 tablet (100 mg total) by mouth daily. Take with or immediately following a meal. 90 tablet 2  . mirabegron ER (MYRBETRIQ) 50 MG TB24 tablet Take 50 mg by mouth daily.    . montelukast (SINGULAIR) 10 MG tablet Take 1 tablet (10 mg total) by mouth daily. 90 tablet 3  . Multiple Vitamin (MULTIVITAMIN) tablet Take 1 tablet by mouth daily.      Marland Kitchen nystatin  (MYCOSTATIN) 100000 UNIT/ML suspension Take 5 cc by mouth, swish and swallow 4 times daily. 280 mL 0  . omeprazole (PRILOSEC) 40 MG capsule Take 1 capsule (40 mg total) by mouth daily. 90 capsule 1  . Probiotic Product (PROBIOTIC DAILY PO) Take by mouth.    . Respiratory Therapy Supplies (FLUTTER) DEVI Use 2-3 times per day 1 each 0  . traZODone (DESYREL) 50 MG tablet Take 0.5-1 tablets (25-50 mg total) by mouth at bedtime as needed for sleep. 30 tablet 0   No current facility-administered medications for this visit.     Allergies:   Clarithromycin; Lipitor [atorvastatin]; Simvastatin; Cardizem [diltiazem hcl]; Contrast media [iodinated diagnostic agents]; Crestor [rosuvastatin calcium]; and Rosuvastatin calcium   Social History:  The patient  reports that she quit smoking about 23 years ago. Her smoking use included cigarettes. She started smoking about 64 years ago. She has a 126.00 pack-year smoking history. She has never used smokeless tobacco. She reports that she does not drink alcohol or use drugs.   Family History:  The patient's family history includes Breast cancer in her maternal aunt, maternal aunt, maternal aunt, and mother; COPD in her mother; Emphysema in her mother; Uterine cancer in her mother.    ROS:  Please see the history of present illness.   Otherwise, review of systems is positive for none.   All other systems are reviewed and negative.   PHYSICAL EXAM: VS:  BP 124/70   Pulse 74   Ht 5\' 4"  (1.626 m)   Wt 140 lb (63.5 kg)   BMI 24.03 kg/m  , BMI Body mass index is 24.03 kg/m. GEN: Well nourished, well developed, in no acute distress  HEENT: normal  Neck: no JVD, carotid bruits, or masses Cardiac: RRR; no murmurs, rubs, or gallops,no edema  Respiratory:  clear to auscultation bilaterally, normal work of breathing GI: soft, nontender, nondistended, + BS MS: no deformity or atrophy  Skin: warm and dry Neuro:  Strength and sensation are intact Psych: euthymic  mood, full affect  EKG:  EKG is ordered today. Personal review of the ekg ordered  shows sinus rhythm, first-degree AV block, PAC, septal Q waves  Recent Labs: 10/17/2018: ALT 15; BUN 23; Creatinine, Ser 1.03; Hemoglobin 13.2; Platelets 198.0; Potassium 3.8; Sodium 141;  TSH 1.07    Lipid Panel     Component Value Date/Time   CHOL 212 (H) 10/17/2018 1420   TRIG 341.0 (H) 10/17/2018 1420   HDL 55.00 10/17/2018 1420   CHOLHDL 4 10/17/2018 1420   VLDL 68.2 (H) 10/17/2018 1420   LDLCALC 165 (H) 11/12/2015 1142   LDLDIRECT 133.0 10/17/2018 1420     Wt Readings from Last 3 Encounters:  12/04/18 140 lb (63.5 kg)  10/24/18 143 lb 6 oz (65 kg)  10/17/18 144 lb (65.3 kg)      Other studies Reviewed: Additional studies/ records that were reviewed today include: TTE 04/05/17, Cath 08/22/16, Telemetry 06/14/16  Review of the above records today demonstrates:  TTE - Left ventricle: The cavity size was normal. Wall thickness was   normal. Systolic function was mildly reduced. The estimated   ejection fraction was in the range of 45% to 50%. Diffuse   hypokinesis. Doppler parameters are consistent with abnormal left   ventricular relaxation (grade 1 diastolic dysfunction). Doppler   parameters are consistent with high ventricular filling pressure. - Aortic valve: There was mild stenosis. - Mitral valve: There was moderate regurgitation. - Left atrium: The atrium was mildly dilated. - Pulmonary arteries: Systolic pressure was mildly increased. PA   peak pressure: 40 mm Hg (S).   Cath  Prox LAD to Mid LAD lesion, 15 %stenosed.  Ost Cx to Prox Cx lesion, 15 %stenosed.  Prox RCA to Mid RCA lesion, 20 %stenosed.  There is mild left ventricular systolic dysfunction.  LV end diastolic pressure is normal.  The left ventricular ejection fraction is 45-50% by visual estimate.   1. Minor nonobstructive CAD 2. Mild LV dysfunction with distal inferior HK 3. Measurement of LV pressures  limited by frequent PVCs but EDP appears normal.   Telemetry 1. NSR with PVC's, at times in a bigeminal manner 2. Signal drop out as well as noise artifact are present.  3. No sustained atrial or ventricular arrhythmias 4. No pauses   ASSESSMENT AND PLAN:  1.  Paroxysmal atrial fibrillation: Currently on Eliquis and Toprol-XL.  She has been taken off of amiodarone.  She is taking her Toprol-XL at night which is greatly improved her fatigue symptoms.  No changes.  On Eliquis and Toprol-XL.  She was taken off of amiodarone previously.  She is having fatigue and I have instructed her to take the Toprol-XL at night.  This patients CHA2DS2-VASc Score and unadjusted Ischemic Stroke Rate (% per year) is equal to 7.2 % stroke rate/year from a score of 5  Above score calculated as 1 point each if present [CHF, HTN, DM, Vascular=MI/PAD/Aortic Plaque, Age if 65-74, or Female] Above score calculated as 2 points each if present [Age > 75, or Stroke/TIA/TE]   2. Chronic combined systolic HF: Currently on Toprol-XL.  No signs of volume overload.  No changes.  3. HTN: Currently well controlled  4. PVCs: None on EKG today.  Has been asymptomatic.   Current medicines are reviewed at length with the patient today.   The patient does not have concerns regarding her medicines.  The following changes were made today: None  Labs/ tests ordered today include:  Orders Placed This Encounter  Procedures  . EKG 12-Lead     Disposition:   FU with Victorhugo Preis 6 months  Signed, Maleigh Bagot Meredith Leeds, MD  12/04/2018 11:31 AM     [facility] [address] [place] [address] [phone] (office) [phone] (fax)

## 2018-12-06 ENCOUNTER — Other Ambulatory Visit: Payer: Self-pay

## 2018-12-06 MED ORDER — COLESTIPOL HCL 1 G PO TABS: 2.0000 g | ORAL_TABLET | Freq: Two times a day (BID) | ORAL | 3 refills | Status: DC

## 2018-12-07 MED ORDER — FLUTICASONE-UMECLIDIN-VILANT 100-62.5-25 MCG/INH IN AEPB: 1.0000 | INHALATION_SPRAY | Freq: Every day | RESPIRATORY_TRACT | 1 refills | Status: DC

## 2018-12-07 MED ORDER — IPRATROPIUM BROMIDE 0.03 % NA SOLN: 2.0000 | Freq: Three times a day (TID) | NASAL | 1 refills | Status: DC | PRN

## 2018-12-18 DIAGNOSIS — M154 Erosive (osteo)arthritis: Secondary | ICD-10-CM | POA: Diagnosis not present

## 2018-12-18 DIAGNOSIS — M8589 Other specified disorders of bone density and structure, multiple sites: Secondary | ICD-10-CM | POA: Diagnosis not present

## 2018-12-18 DIAGNOSIS — M150 Primary generalized (osteo)arthritis: Secondary | ICD-10-CM | POA: Diagnosis not present

## 2018-12-18 DIAGNOSIS — Z6824 Body mass index (BMI) 24.0-24.9, adult: Secondary | ICD-10-CM | POA: Diagnosis not present

## 2018-12-18 DIAGNOSIS — Z79899 Other long term (current) drug therapy: Secondary | ICD-10-CM | POA: Diagnosis not present

## 2018-12-18 DIAGNOSIS — M2550 Pain in unspecified joint: Secondary | ICD-10-CM | POA: Diagnosis not present

## 2018-12-21 ENCOUNTER — Ambulatory Visit (INDEPENDENT_AMBULATORY_CARE_PROVIDER_SITE_OTHER): Payer: Medicare Other | Admitting: Internal Medicine

## 2018-12-21 ENCOUNTER — Encounter: Payer: Self-pay | Admitting: Internal Medicine

## 2018-12-21 VITALS — BP 116/62 | HR 95 | Ht 64.0 in | Wt 139.0 lb

## 2018-12-21 DIAGNOSIS — R140 Abdominal distension (gaseous): Secondary | ICD-10-CM | POA: Diagnosis not present

## 2018-12-21 DIAGNOSIS — R197 Diarrhea, unspecified: Secondary | ICD-10-CM | POA: Diagnosis not present

## 2018-12-21 DIAGNOSIS — K6389 Other specified diseases of intestine: Secondary | ICD-10-CM | POA: Diagnosis not present

## 2018-12-21 NOTE — Progress Notes (Signed)
HISTORY OF PRESENT ILLNESS:  [name] is a pleasant 78 y.o. female who was last evaluated October 24, 2018 regarding chronic diarrhea and increased intestinal gas.  She is status post right hemicolectomy with temporary ileostomy and subsequent reversal.  Problems with diarrhea and gas since her reversal.  She was treated with Colestid and metronidazole.  Metronidazole improved gas.  Colestid helped diarrhea though incompletely.  She was advised to use Imodium and follow-up at this time.  She is accompanied by her husband.  She is pleased to report that with 1 Imodium a day and Colestid she is having normal bowel movements.  Occasional constipation for which she holds Imodium.  She is now able to enjoy her social activities without issues.  No complaints or side effects.  REVIEW OF SYSTEMS:  All non-GI ROS negative except for night sweats, muscle cramps, cough  Past Medical History:  Diagnosis Date  . Allergic rhinitis   . Anxiety   . Atrial septal aneurysm   . Basal cell carcinoma   . Bradycardia   . Cataract    bil cataracts removed  . Chronic combined systolic and diastolic CHF (congestive heart failure) ([place])   . CKD (chronic kidney disease), stage III ([place])   . Clostridium difficile infection   . COPD (chronic obstructive pulmonary disease) ([place])   . Depression 11/29/2013  . DI (detrusor instability)   . Esophageal stricture   . GERD (gastroesophageal reflux disease)   . Hemorrhoids   . Hiatal hernia   . History of kidney stones   . HLD (hyperlipidemia)   . HTN (hypertension)   . Ischemic colon ([place])    a. 08/2016 - adm to  [facility] with a ruptured colon s/p right hemicolectomy with ileostomy formation for ischemic and necrotic colon.   . Lung nodule    a. followed by pulmonology  . Mild CAD    a. minor nonobstructive CAD 08/22/16.  . Nephrolithiasis    hx  . NICM (nonischemic cardiomyopathy) ([place])   . Orthostatic hypotension   . Osteoarthritis   . PAF  (paroxysmal atrial fibrillation) ([place])    a. diagnosed 07/2016 s/p DCCV 08/01/16 with recurrence, managed with rate control for now.   . Pneumonia 2013  . PVC's (premature ventricular contractions)   . Shingles since Nov 18, 2013   on right arm from shoulder to wrist  . Status post dilation of esophageal narrowing   . Stroke [facility]) 2001   STROKES, TIA'S  . Syncope    a. 08/2016 felt due to orthostatic hypotension.  Marland Kitchen TIA (transient ischemic attack) last 2001   anticoagulation therapy on coumadin    Past Surgical History:  Procedure Laterality Date  . ANTERIOR LATERAL LUMBAR FUSION 4 LEVELS Right 07/03/2015   Procedure: Right Lumbar one-two, Lumbar two-three, Lumbar three-four, Lumbar four-five Anterior lateral lumbar interbody fusion;  Surgeon: Erline Levine, MD;  Location: [place] NEURO ORS;  Service: Neurosurgery;  Laterality: Right;  Right L1-2 L2-3 L3-4 L4-5 Anterior lateral lumbar interbody fusion  . BALLOON DILATION N/A 03/03/2014   Procedure: BALLOON DILATION;  Surgeon: Inda Castle, MD;  [facility];  Service: Endoscopy;  Laterality: N/A;  . BLADDER SUSPENSION    . BREAST BIOPSY    . CARDIAC CATHETERIZATION N/A 08/22/2016   Procedure: Left Heart Cath and Coronary Angiography;  Surgeon: Peter M Martinique, MD;  Location: [place] CV LAB;  Service: Cardiovascular;  Laterality: N/A;  . CARDIOVERSION N/A 08/01/2016   Procedure: CARDIOVERSION;  Surgeon: Elta Guadeloupe  Lurline Del, MD;  Location: [place];  Service: Cardiovascular;  Laterality: N/A;  . CARDIOVERSION N/A 10/11/2016   Procedure: CARDIOVERSION;  Surgeon: Sueanne Margarita, MD;  [facility];  Service: Cardiovascular;  Laterality: N/A;  . COLONOSCOPY    . ESOPHAGOGASTRODUODENOSCOPY N/A 03/03/2014   Procedure: ESOPHAGOGASTRODUODENOSCOPY (EGD);  Surgeon: Inda Castle, MD;  Location: [facility];  Service: Endoscopy;  Laterality: N/A;  . EXCISION OF BASAL CELL CA  2011  . FINGER SURGERY    . GLUTEUS MINIMUS REPAIR Right  03/15/2016   Procedure: RIGHT HIP GLUTEUS MEDIUS TENDON REPAIR;  Surgeon: Paralee Cancel, MD;  [facility];  Service: Orthopedics;  Laterality: Right;  . ileostomy reversal    . LUMBAR PERCUTANEOUS PEDICLE SCREW 4 LEVEL Right 07/03/2015   Procedure: Lumbar one-Sacral one Bilateral percutaneous pedicle screws;  Surgeon: Erline Levine, MD;  Location: [place] NEURO ORS;  Service: Neurosurgery;  Laterality: Right;  L1-S1 Bilateral percutaneous pedicle screws  . RECTOCELE REPAIR  2008   A&P REPAIR with vaginal repair-DR. MCDIARMID  . THUMB SURGERY Right 10-15 yrs ago  . UPPER GASTROINTESTINAL ENDOSCOPY    . VAGINAL HYSTERECTOMY  1983   NO BSO-DR. MABRY  . VARICOSE VEIN SURGERY      Social History [name]  reports that she quit smoking about 23 years ago. Her smoking use included cigarettes. She started smoking about 64 years ago. She has a 126.00 pack-year smoking history. She has never used smokeless tobacco. She reports that she does not drink alcohol or use drugs.  family history includes Breast cancer in her maternal aunt, maternal aunt, maternal aunt, and mother; COPD in her mother; Emphysema in her mother; Uterine cancer in her mother.  Allergies  Allergen Reactions  . Clarithromycin Other (See Comments)    REACTION: possible rash but could have been legionarres dz with rash  . Lipitor [Atorvastatin] Other (See Comments)    MUSCLE SPASMS  . Simvastatin Other (See Comments)    Muscle and joint pain  . Cardizem [Diltiazem Hcl] Hives  . Contrast Media [Iodinated Diagnostic Agents]     FYI - during admission at [facility] 08/2016 patient developed hives, question related to cardizem or contrast dye - not clear  . Crestor [Rosuvastatin Calcium]     fagitue and joint pain, "NO STATINS"  . Rosuvastatin Calcium Other (See Comments)    fagitue and joint pain, "NO STATINS"       PHYSICAL EXAMINATION: Vital signs: BP 116/62   Pulse 95   Ht 5\' 4"  (1.626 m)   Wt 139 lb (63 kg)    BMI 23.86 kg/m   Constitutional: generally well-appearing, no acute distress Psychiatric: alert and oriented x3, cooperative Abdomen: Not reexamined Neuro: No focal deficits.   ASSESSMENT:  1.  Diarrhea and increased intestinal gas after reversal of colostomy for partial bowel partial bowel resection.  Suspect problems secondary to short transit time and bacterial overgrowth.  Symptoms resolved after course of metronidazole, regular Colestid, and Imodium   PLAN:  1.  Continue Colestid 2.  Continue Imodium (hold for constipation 3.  Resume care with Dr. Jenny Reichmann.  GI follow-up as needed.

## 2018-12-21 NOTE — Patient Instructions (Signed)
Please follow up as needed 

## 2018-12-23 ENCOUNTER — Encounter: Payer: Self-pay | Admitting: Internal Medicine

## 2018-12-24 MED ORDER — TRAZODONE HCL 50 MG PO TABS: 50.0000 mg | ORAL_TABLET | Freq: Every evening | ORAL | 1 refills | Status: DC | PRN

## 2018-12-28 ENCOUNTER — Encounter: Payer: Self-pay | Admitting: Internal Medicine

## 2018-12-28 MED ORDER — APIXABAN 5 MG PO TABS: 5.0000 mg | ORAL_TABLET | Freq: Two times a day (BID) | ORAL | 1 refills | Status: DC

## 2019-01-09 ENCOUNTER — Encounter: Payer: Self-pay | Admitting: Internal Medicine

## 2019-01-14 ENCOUNTER — Other Ambulatory Visit: Payer: Self-pay | Admitting: Gynecology

## 2019-01-14 DIAGNOSIS — Z1231 Encounter for screening mammogram for malignant neoplasm of breast: Secondary | ICD-10-CM

## 2019-01-17 MED ORDER — AZELASTINE HCL 0.1 % NA SOLN: NASAL | 1 refills | Status: DC

## 2019-02-26 ENCOUNTER — Encounter: Payer: Self-pay | Admitting: Internal Medicine

## 2019-02-26 MED ORDER — FLUOCINONIDE 0.05 % EX CREA: 1.0000 "application " | TOPICAL_CREAM | Freq: Two times a day (BID) | CUTANEOUS | 0 refills | Status: DC

## 2019-02-28 ENCOUNTER — Ambulatory Visit: Payer: Medicare Other

## 2019-03-01 ENCOUNTER — Encounter: Payer: Self-pay | Admitting: Internal Medicine

## 2019-03-01 MED ORDER — NYSTATIN 100000 UNIT/ML MT SUSP: OROMUCOSAL | 0 refills | Status: DC

## 2019-03-05 MED ORDER — NYSTATIN 100000 UNIT/ML MT SUSP: OROMUCOSAL | 0 refills | Status: DC

## 2019-03-05 NOTE — Addendum Note (Signed)
Addended by: Juliet Rude on: 03/05/2019 08:06 AM   Modules accepted: Orders

## 2019-03-18 ENCOUNTER — Ambulatory Visit: Payer: Medicare Other | Admitting: Pulmonary Disease

## 2019-04-10 ENCOUNTER — Ambulatory Visit: Payer: Medicare Other

## 2019-04-19 ENCOUNTER — Ambulatory Visit (INDEPENDENT_AMBULATORY_CARE_PROVIDER_SITE_OTHER): Payer: Medicare Other | Admitting: Internal Medicine

## 2019-04-19 DIAGNOSIS — J438 Other emphysema: Secondary | ICD-10-CM | POA: Diagnosis not present

## 2019-04-19 DIAGNOSIS — R739 Hyperglycemia, unspecified: Secondary | ICD-10-CM

## 2019-04-19 DIAGNOSIS — K2100 Gastro-esophageal reflux disease with esophagitis, without bleeding: Secondary | ICD-10-CM

## 2019-04-19 MED ORDER — MONTELUKAST SODIUM 10 MG PO TABS: 10.0000 mg | ORAL_TABLET | Freq: Every day | ORAL | 3 refills | Status: DC

## 2019-04-19 MED ORDER — OMEPRAZOLE 40 MG PO CPDR: 40.0000 mg | DELAYED_RELEASE_CAPSULE | Freq: Every day | ORAL | 3 refills | Status: DC

## 2019-04-19 NOTE — Progress Notes (Signed)
Patient ID: [name], female   DOB: January 18, 1941, 78 y.o.   MRN: [id number]  Virtual Visit via Video Note but phone only due to failed video  I connected with [name] on 04/19/19 at  2:00 PM EDT by a video enabled telemedicine application and verified that I am speaking with the correct person using two identifiers.  Location: Patient: at home Provider: at office   I discussed the limitations of evaluation and management by telemedicine and the availability of in person appointments. The patient expressed understanding and agreed to proceed.  History of Present Illness: Here to f/u; overall doing ok,  Pt denies chest pain, increasing sob or doe, wheezing, orthopnea, PND, increased LE swelling, palpitations, dizziness or syncope.  Pt denies new neurological symptoms such as new headache, or facial or extremity weakness or numbness.  Pt denies polydipsia, polyuria, or low sugar episode.  Pt states overall good compliance with meds, mostly trying to follow appropriate diet, with wt overall stable per pt.   Pt denies fever, wt loss, night sweats, loss of appetite, or other constitutional symptoms  Denies worsening depressive symptoms, suicidal ideation, or panic.  No other new complaints.  Denies worsening reflux, abd pain, dysphagia, n/v, bowel change or blood. Past Medical History:  Diagnosis Date  . Allergic rhinitis   . Anxiety   . Atrial septal aneurysm   . Basal cell carcinoma   . Bradycardia   . Cataract    bil cataracts removed  . Chronic combined systolic and diastolic CHF (congestive heart failure) ([place])   . CKD (chronic kidney disease), stage III ([place])   . Clostridium difficile infection   . COPD (chronic obstructive pulmonary disease) ([place])   . Depression 11/29/2013  . DI (detrusor instability)   . Esophageal stricture   . GERD (gastroesophageal reflux disease)   . Hemorrhoids   . Hiatal hernia   . History of kidney stones   . HLD (hyperlipidemia)   . HTN  (hypertension)   . Ischemic colon ([place])    a. 08/2016 - adm to  [facility] with a ruptured colon s/p right hemicolectomy with ileostomy formation for ischemic and necrotic colon.   . Lung nodule    a. followed by pulmonology  . Mild CAD    a. minor nonobstructive CAD 08/22/16.  . Nephrolithiasis    hx  . NICM (nonischemic cardiomyopathy) ([place])   . Orthostatic hypotension   . Osteoarthritis   . PAF (paroxysmal atrial fibrillation) ([place])    a. diagnosed 07/2016 s/p DCCV 08/01/16 with recurrence, managed with rate control for now.   . Pneumonia 2013  . PVC's (premature ventricular contractions)   . Shingles since Nov 18, 2013   on right arm from shoulder to wrist  . Status post dilation of esophageal narrowing   . Stroke [facility]) 2001   STROKES, TIA'S  . Syncope    a. 08/2016 felt due to orthostatic hypotension.  Marland Kitchen TIA (transient ischemic attack) last 2001   anticoagulation therapy on coumadin   Past Surgical History:  Procedure Laterality Date  . ANTERIOR LATERAL LUMBAR FUSION 4 LEVELS Right 07/03/2015   Procedure: Right Lumbar one-two, Lumbar two-three, Lumbar three-four, Lumbar four-five Anterior lateral lumbar interbody fusion;  Surgeon: Erline Levine, MD;  Location: [place] NEURO ORS;  Service: Neurosurgery;  Laterality: Right;  Right L1-2 L2-3 L3-4 L4-5 Anterior lateral lumbar interbody fusion  . BALLOON DILATION N/A 03/03/2014   Procedure: BALLOON DILATION;  Surgeon: Inda Castle, MD;  Location: [facility];  Service: Endoscopy;  Laterality: N/A;  . BLADDER SUSPENSION    . BREAST BIOPSY    . CARDIAC CATHETERIZATION N/A 08/22/2016   Procedure: Left Heart Cath and Coronary Angiography;  Surgeon: Peter M Martinique, MD;  Location: [place] CV LAB;  Service: Cardiovascular;  Laterality: N/A;  . CARDIOVERSION N/A 08/01/2016   Procedure: CARDIOVERSION;  Surgeon: Jerline Pain, MD;  Location: [place];  Service: Cardiovascular;  Laterality: N/A;  . CARDIOVERSION N/A 10/11/2016    Procedure: CARDIOVERSION;  Surgeon: Sueanne Margarita, MD;  [facility];  Service: Cardiovascular;  Laterality: N/A;  . COLONOSCOPY    . ESOPHAGOGASTRODUODENOSCOPY N/A 03/03/2014   Procedure: ESOPHAGOGASTRODUODENOSCOPY (EGD);  Surgeon: Inda Castle, MD;  Location: [facility];  Service: Endoscopy;  Laterality: N/A;  . EXCISION OF BASAL CELL CA  2011  . FINGER SURGERY    . GLUTEUS MINIMUS REPAIR Right 03/15/2016   Procedure: RIGHT HIP GLUTEUS MEDIUS TENDON REPAIR;  Surgeon: Paralee Cancel, MD;  [facility];  Service: Orthopedics;  Laterality: Right;  . ileostomy reversal    . LUMBAR PERCUTANEOUS PEDICLE SCREW 4 LEVEL Right 07/03/2015   Procedure: Lumbar one-Sacral one Bilateral percutaneous pedicle screws;  Surgeon: Erline Levine, MD;  Location: [place] NEURO ORS;  Service: Neurosurgery;  Laterality: Right;  L1-S1 Bilateral percutaneous pedicle screws  . RECTOCELE REPAIR  2008   A&P REPAIR with vaginal repair-DR. MCDIARMID  . THUMB SURGERY Right 10-15 yrs ago  . UPPER GASTROINTESTINAL ENDOSCOPY    . VAGINAL HYSTERECTOMY  1983   NO BSO-DR. MABRY  . VARICOSE VEIN SURGERY      reports that she quit smoking about 23 years ago. Her smoking use included cigarettes. She started smoking about 64 years ago. She has a 126.00 pack-year smoking history. She has never used smokeless tobacco. She reports that she does not drink alcohol or use drugs. family history includes Breast cancer in her maternal aunt, maternal aunt, maternal aunt, and mother; COPD in her mother; Emphysema in her mother; Uterine cancer in her mother. Allergies  Allergen Reactions  . Clarithromycin Other (See Comments)    REACTION: possible rash but could have been legionarres dz with rash  . Lipitor [Atorvastatin] Other (See Comments)    MUSCLE SPASMS  . Simvastatin Other (See Comments)    Muscle and joint pain  . Cardizem [Diltiazem Hcl] Hives  . Contrast Media [Iodinated Diagnostic Agents]     FYI - during admission at  [facility]/2016 patient developed hives, question related to cardizem or contrast dye - not clear  . Crestor [Rosuvastatin Calcium]     fagitue and joint pain, "NO STATINS"  . Rosuvastatin Calcium Other (See Comments)    fagitue and joint pain, "NO STATINS"   Current Outpatient Medications on File Prior to Visit  Medication Sig Dispense Refill  . albuterol (PROVENTIL HFA;VENTOLIN HFA) 108 (90 Base) MCG/ACT inhaler Inhale 1-2 puffs into the lungs every 6 (six) hours as needed for wheezing or shortness of breath. 3 Inhaler 4  . apixaban (ELIQUIS) 5 MG TABS tablet Take 1 tablet (5 mg total) by mouth 2 (two) times daily. 180 tablet 1  . azelastine (ASTELIN) 0.1 % nasal spray Place 2 sprays into the nose daily. Use in each nostril as directed 90 mL 1  . buPROPion (WELLBUTRIN XL) 300 MG 24 hr tablet Take 1 tablet (300 mg total) by mouth daily. 90 tablet 3  . Calcium Carbonate-Vitamin D (CALCIUM + D PO) Take 1 tablet by mouth  2 (two) times daily.     . colestipol (COLESTID) 1 g tablet Take 2 tablets (2 g total) by mouth 2 (two) times daily. 360 tablet 3  . cyclobenzaprine (FLEXERIL) 5 MG tablet Take 1 tablet (5 mg total) by mouth 3 (three) times daily as needed for muscle spasms. 270 tablet 1  . ezetimibe (ZETIA) 10 MG tablet Take 1 tablet (10 mg total) by mouth daily. 90 tablet 3  . fluocinonide cream (LIDEX) 9.02 % Apply 1 application topically 2 (two) times daily. 30 g 0  . fluticasone (FLONASE) 50 MCG/ACT nasal spray Place 1 spray into both nostrils 2 (two) times daily. 48 g 1  . Fluticasone-Umeclidin-Vilant (TRELEGY ELLIPTA) 100-62.5-25 MCG/INH AEPB Inhale 1 puff into the lungs daily. I inhalation once daily 180 each 1  . furosemide (LASIX) 20 MG tablet Take 1 tablet (20 mg total) by mouth daily as needed (swelling). 90 tablet 3  . gabapentin (NEURONTIN) 300 MG capsule 1 tab by mouth in AM, 1 po at midday, then 2 by mouth at bedtime for sleep and pain (Patient taking differently: 1 tab by  mouth in AM,  2 by mouth at bedtime for sleep and pain) 360 capsule 1  . guaiFENesin (MUCINEX) 600 MG 12 hr tablet Take 1,200 mg by mouth 2 (two) times daily as needed for cough or to loosen phlegm.    . hydroxychloroquine (PLAQUENIL) 200 MG tablet Take 400 mg by mouth at bedtime.    Marland Kitchen ipratropium (ATROVENT) 0.03 % nasal spray Place 2 sprays into both nostrils 3 (three) times daily as needed for rhinitis. 90 mL 1  . levothyroxine (SYNTHROID, LEVOTHROID) 25 MCG tablet Take 1 tablet (25 mcg total) by mouth daily before breakfast. 90 tablet 3  . Lidocaine HCl 2 % CREA Apply 1 application topically daily as needed (for shingles flares).    Marland Kitchen loperamide (IMODIUM) 2 MG capsule Take by mouth.    . Melatonin 5 MG TABS Take 5 mg by mouth at bedtime.    . metoprolol succinate (TOPROL-XL) 100 MG 24 hr tablet Take 1 tablet (100 mg total) by mouth daily. Take with or immediately following a meal. 90 tablet 2  . mirabegron ER (MYRBETRIQ) 50 MG TB24 tablet Take 50 mg by mouth daily.    . Multiple Vitamin (MULTIVITAMIN) tablet Take 1 tablet by mouth daily.      Marland Kitchen nystatin (MYCOSTATIN) 100000 UNIT/ML suspension Take 5 cc by mouth, swish and swallow 4 times daily. 280 mL 0  . Probiotic Product (PROBIOTIC DAILY PO) Take by mouth.    . traZODone (DESYREL) 50 MG tablet Take 1 tablet (50 mg total) by mouth at bedtime and may repeat dose one time if needed. 180 tablet 1   No current facility-administered medications on file prior to visit.     Observations/Objective: Unable due to phone only  Assessment and Plan: See notes  Follow Up Instructions: See notes   I discussed the assessment and treatment plan with the patient. The patient was provided an opportunity to ask questions and all were answered. The patient agreed with the plan and demonstrated an understanding of the instructions.   The patient was advised to call back or seek an in-person evaluation if the symptoms worsen or if the condition fails to  improve as anticipated.  Cathlean Cower, MD

## 2019-04-20 ENCOUNTER — Encounter: Payer: Self-pay | Admitting: Internal Medicine

## 2019-04-20 NOTE — Patient Instructions (Signed)
Please continue all other medications as before, and refills have been done if requested.  Please have the pharmacy call with any other refills you may need.  Please continue your efforts at being more active, low cholesterol diet, and weight control.  You are otherwise up to date with prevention measures today.  Please keep your appointments with your specialists as you may have planned  Please return in 6 months, or sooner if needed 

## 2019-04-20 NOTE — Assessment & Plan Note (Signed)
stable overall by history and exam, recent data reviewed with pt, and pt to continue medical treatment as before,  to f/u any worsening symptoms or concerns  

## 2019-04-22 ENCOUNTER — Telehealth: Payer: Self-pay | Admitting: Internal Medicine

## 2019-04-22 ENCOUNTER — Encounter: Payer: Self-pay | Admitting: Internal Medicine

## 2019-04-22 ENCOUNTER — Encounter: Payer: Self-pay | Admitting: Adult Health

## 2019-04-22 ENCOUNTER — Ambulatory Visit (INDEPENDENT_AMBULATORY_CARE_PROVIDER_SITE_OTHER): Payer: Medicare Other | Admitting: Adult Health

## 2019-04-22 ENCOUNTER — Other Ambulatory Visit: Payer: Self-pay

## 2019-04-22 DIAGNOSIS — J310 Chronic rhinitis: Secondary | ICD-10-CM | POA: Diagnosis not present

## 2019-04-22 DIAGNOSIS — K2100 Gastro-esophageal reflux disease with esophagitis, without bleeding: Secondary | ICD-10-CM

## 2019-04-22 DIAGNOSIS — J449 Chronic obstructive pulmonary disease, unspecified: Secondary | ICD-10-CM

## 2019-04-22 DIAGNOSIS — K219 Gastro-esophageal reflux disease without esophagitis: Secondary | ICD-10-CM | POA: Diagnosis not present

## 2019-04-22 MED ORDER — OMEPRAZOLE 40 MG PO CPDR: 40.0000 mg | DELAYED_RELEASE_CAPSULE | Freq: Every day | ORAL | 3 refills | Status: DC

## 2019-04-22 MED ORDER — MONTELUKAST SODIUM 10 MG PO TABS: 10.0000 mg | ORAL_TABLET | Freq: Every day | ORAL | 3 refills | Status: DC

## 2019-04-22 NOTE — Progress Notes (Signed)
Virtual Visit via Telephone Note  I connected with [name] on 04/22/19 at 11:30 AM EDT by telephone and verified that I am speaking with the correct person using two identifiers.  Location: Patient: Home  Provider: Office    I discussed the limitations, risks, security and privacy concerns of performing an evaluation and management service by telephone and the availability of in person appointments. I also discussed with the patient that there may be a patient responsible charge related to this service. The patient expressed understanding and agreed to proceed.   History of Present Illness: 78 year old female former smoker followed for severe COPD, chronic rhinitis and GERD.  Stable lung nodule (serial CT chest from 2015 01/2017 showed stable 7 mm right lower lobe nodule consistent with benign etiology)  Medical history significant for chronic combined congestive heart failure, A. fib (cardioversion 1610) , embolic CVA, chronic Coumadin Previous rupture: Requiring right hemicolectomy with ileostomy for ischemic and necrotic colon.  2017.  Previous ileostomy takedown and repair that was complicated by C. Difficile. Arthritis on Plaquenil  Today's tele-visit is a 70-month follow-up for COPD, chronic rhinitis and GERD.  Patient has severe COPD.  Says that she is doing very well.  Feels that since she started on Trelegy inhaler that she has done the best.  Says she is had good control of her breathing over the last 6 months.  She is had no flare of cough or wheezing.  Shortness of breath has had been at baseline.  She denies any chest pain orthopnea PND or increased leg swelling.  Denies any recent antibiotics.  Last Pneumovax was in 2008.  Chest x-ray 2019 showed COPD changes.  Patient has chronic rhinitis.  Is on Flonase and Astelin daily.  Uses ipratropium nasal spray for drippy nose.  Says she is on Singulair daily.  Says overall allergies have been well controlled. She has been changed  from Zyrtec to Claritin.  Says that she is not noticed any change in her symptom control.    Patient has underlying GERD.  Is on omeprazole and famotidine.  Denies any breakthrough reflux.  No difficulty swallowing.   Observations/Objective: PFT 10/10/16:FVC 1.94 L (70%) FEV1 0.93 L (45%) FEV1/FVC 0.48 FEF 25-75 0.35 L (22%) positive bronchodilator response 03/01/16: FVC 2.55 L (91%) FEV1 1.30 L (62%) FEV1/FVC 0.51 FEF 25-75 0.56 L (34%) negative bronchodilator response TLC 5.66 L (111%) RV 116% ERV 133% DLCO uncorrected 62% (hemoglobin 15.1) 12/28/11:FVC 2.89 L (104%) FEV1 1.22 L (62%) FEV1/FVC 0.42 FEF 25-75 0.33 L (15%) negative bronchodilator response TLC 6.19 L (129%) RV 163% ERV 114% DLCO uncorrected 69%  6MWT 10/10/16: Walked 96 meters / Baseline Sat 93% on RA / Nadir Sat 93% on RA @ rest (couldn't finish due to weakness &dyspnea W/ 2:57 left) 08/24/16:  Walked 1 lap & was 96% at lowest on room air  IMAGING CXR PA/LAT 06/19/17 :     CXR PA/LAT 05/18/17:  Patchy opacification in right middle and lower lobes. No pleural effusion appreciated. Heart normal in size & mediastinum normal in contour.  CT CHEST W/O 01/23/17: Resolution of groundglass opacity. 7 mm right lower lobe nodule unchanged. Apical predominant emphysematous changes. Right middle lobe nodule was not appreciated on this image. No pleural effusion or thickening. No pathologic mediastinal adenopathy. No pericardial effusion.  CTA CHEST 08/17/16:  No evidence of PE. No pleural effusion or thickening. No pathologic mediastinal adenopathy. Groundglass noted within right lung predominantly in the upper lung zone. Moderate upper lobe  predominant emphysema which has progressed since 2015. 7 mm right lower lobe nodule has not changed since 2015. Patient has a 4 mm nodule within right middle lobe which appears new.  CXR PA/LAT 05/25/16: Hyperinflation with flattening of the diaphragms. No parenchymal opacity or mass  appreciated. No pleural effusion. Heart normal in size & mediastinum normal in contour.  CXR PA/LAT 03/22/16:  Hyperinflation with elevation of left hemidiaphragm. No new focal opacity. Heart normal in size & mediastinum normal in contour.  CXR PA/LAT 11/09/15: No focal opacity or effusion appreciated. Heart normal in size. Mediastinum normal in contour. Mild hyperinflation with flattening of the diaphragms.   CARDIAC TTE (06/24/16): LVEF 45-50% with grade 2 diastolic dysfunction. LA moderately to severely dilated. RA normal in size. RV normal in size and function. No aortic stenosis or regurgitation. Moderate centrally directed mitral regurgitation. Paradoxical ventricular septal motion consistent with intraventricular conduction delay. No pulmonic regurgitation. No tricuspid regurgitation. No pericardial effusion.  MICROBIOLOGY Sputum Culture (07/05/17):  Candida Species / AFB negative / Moderate Squamous Epithelials   LABS 12/22/15 Alpha-1 antitrypsin: MM (143) 11/2017 IgE 9 (normal)  Assessment and Plan: Severe COPD -stable  Chronic Rhinitis - stable on current regimen  GERD - good control   Plan  Patient Instructions  Severe COPD:  Continue Trelegy 1 puff daily Rinse her mouth well after using Trelegy Practice good hand hygiene Stay active You will need a Pneumovax vaccine booster as it has been greater than 10 years since your last one, we can give this at your next visit or you can get at your pharmacy if they provide this service or at your Primary Care provider   Gastroesophageal reflux disease: Continue taking omeprazole daily in am .  Continue taking famotidine in evening .   Vasomotor rhinitis: Continue taking ipratropium nose spray as directed Continue loratadine 10 mg daily Continue Astelin nose spray daily  Continue fluticasone nose spray daily   We will see you back in 6 months with Dr. Lake Bells or  NP and As needed       Follow Up  Instructions: Follow up in 6 months and As needed      I discussed the assessment and treatment plan with the patient. The patient was provided an opportunity to ask questions and all were answered. The patient agreed with the plan and demonstrated an understanding of the instructions.   The patient was advised to call back or seek an in-person evaluation if the symptoms worsen or if the condition fails to improve as anticipated.  I provided 22 minutes of non-face-to-face time during this encounter.   Rexene Edison, NP

## 2019-04-22 NOTE — Telephone Encounter (Signed)
Copied from [place] [phone]. Topic: Quick Communication - Rx Refill/Question >> Apr 22, 2019  8:58 AM [name] wrote: Medication:  All medications that were supposed to be sent on Friday after televisit.  Pt states they went to the wrong pharmacy.  Pt states all of her medication should go to [place], [place] [phone] (Phone) [phone] (Fax) But they were sent to the local pharmacy.   Pt wants a call when this has been corrected.  Pt can be reached at [phone]

## 2019-04-22 NOTE — Telephone Encounter (Signed)
Resent meds to meds-by-mail.Marland KitchenJohny Chess

## 2019-04-22 NOTE — Patient Instructions (Signed)
Severe COPD:  Continue Trelegy 1 puff daily Rinse her mouth well after using Trelegy Practice good hand hygiene Stay active You will need a Pneumovax vaccine booster as it has been greater than 10 years since your last one, we can give this at your next visit or you can get at your pharmacy if they provide this service or at your Primary Care provider   Gastroesophageal reflux disease: Continue taking omeprazole daily in am .  Continue taking famotidine in evening .   Vasomotor rhinitis: Continue taking ipratropium nose spray as directed Continue loratadine 10 mg daily Continue Astelin nose spray daily  Continue fluticasone nose spray daily   We will see you back in 6 months with Dr. Lake Bells or Daemien Fronczak NP and As needed

## 2019-04-23 NOTE — Progress Notes (Signed)
Reviewed, agree 

## 2019-05-02 ENCOUNTER — Ambulatory Visit: Payer: Medicare Other | Admitting: Pulmonary Disease

## 2019-05-09 ENCOUNTER — Encounter: Payer: Self-pay | Admitting: Internal Medicine

## 2019-05-09 DIAGNOSIS — K2100 Gastro-esophageal reflux disease with esophagitis, without bleeding: Secondary | ICD-10-CM

## 2019-05-09 MED ORDER — OMEPRAZOLE 40 MG PO CPDR: 40.0000 mg | DELAYED_RELEASE_CAPSULE | Freq: Every day | ORAL | 3 refills | Status: DC

## 2019-05-22 ENCOUNTER — Ambulatory Visit
Admission: RE | Admit: 2019-05-22 | Discharge: 2019-05-22 | Disposition: A | Discharge status: Home / Self Care | Payer: Medicare Other | Source: Ambulatory Visit | Attending: Gynecology | Admitting: Gynecology

## 2019-05-22 ENCOUNTER — Other Ambulatory Visit: Payer: Self-pay

## 2019-05-22 DIAGNOSIS — Z1231 Encounter for screening mammogram for malignant neoplasm of breast: Secondary | ICD-10-CM | POA: Diagnosis not present

## 2019-05-29 DIAGNOSIS — I4891 Unspecified atrial fibrillation: Secondary | ICD-10-CM | POA: Diagnosis not present

## 2019-05-29 DIAGNOSIS — I129 Hypertensive chronic kidney disease with stage 1 through stage 4 chronic kidney disease, or unspecified chronic kidney disease: Secondary | ICD-10-CM | POA: Diagnosis not present

## 2019-05-29 DIAGNOSIS — N183 Chronic kidney disease, stage 3 (moderate): Secondary | ICD-10-CM | POA: Diagnosis not present

## 2019-05-29 DIAGNOSIS — I509 Heart failure, unspecified: Secondary | ICD-10-CM | POA: Diagnosis not present

## 2019-06-04 DIAGNOSIS — M150 Primary generalized (osteo)arthritis: Secondary | ICD-10-CM | POA: Diagnosis not present

## 2019-06-04 DIAGNOSIS — M2550 Pain in unspecified joint: Secondary | ICD-10-CM | POA: Diagnosis not present

## 2019-06-04 DIAGNOSIS — Z6823 Body mass index (BMI) 23.0-23.9, adult: Secondary | ICD-10-CM | POA: Diagnosis not present

## 2019-06-04 DIAGNOSIS — M154 Erosive (osteo)arthritis: Secondary | ICD-10-CM | POA: Diagnosis not present

## 2019-06-04 DIAGNOSIS — Z79899 Other long term (current) drug therapy: Secondary | ICD-10-CM | POA: Diagnosis not present

## 2019-06-04 DIAGNOSIS — M8589 Other specified disorders of bone density and structure, multiple sites: Secondary | ICD-10-CM | POA: Diagnosis not present

## 2019-06-06 DIAGNOSIS — N3941 Urge incontinence: Secondary | ICD-10-CM | POA: Diagnosis not present

## 2019-06-06 DIAGNOSIS — R350 Frequency of micturition: Secondary | ICD-10-CM | POA: Diagnosis not present

## 2019-06-17 DIAGNOSIS — Z20828 Contact with and (suspected) exposure to other viral communicable diseases: Secondary | ICD-10-CM | POA: Diagnosis not present

## 2019-06-17 MED ORDER — FLUTICASONE PROPIONATE 50 MCG/ACT NA SUSP: 1.0000 | Freq: Two times a day (BID) | NASAL | 1 refills | Status: DC

## 2019-06-20 ENCOUNTER — Encounter: Payer: Self-pay | Admitting: Internal Medicine

## 2019-06-20 MED ORDER — TRAZODONE HCL 50 MG PO TABS: 50.0000 mg | ORAL_TABLET | Freq: Every evening | ORAL | 1 refills | Status: DC | PRN

## 2019-06-20 MED ORDER — APIXABAN 5 MG PO TABS: 5.0000 mg | ORAL_TABLET | Freq: Two times a day (BID) | ORAL | 1 refills | Status: DC

## 2019-07-16 ENCOUNTER — Other Ambulatory Visit: Payer: Self-pay

## 2019-07-17 ENCOUNTER — Ambulatory Visit (INDEPENDENT_AMBULATORY_CARE_PROVIDER_SITE_OTHER): Payer: Medicare Other | Admitting: Gynecology

## 2019-07-17 ENCOUNTER — Encounter: Payer: Self-pay | Admitting: Gynecology

## 2019-07-17 VITALS — BP 122/66 | Ht 63.0 in | Wt 138.0 lb

## 2019-07-17 DIAGNOSIS — N8111 Cystocele, midline: Secondary | ICD-10-CM

## 2019-07-17 DIAGNOSIS — Z01419 Encounter for gynecological examination (general) (routine) without abnormal findings: Secondary | ICD-10-CM | POA: Diagnosis not present

## 2019-07-17 DIAGNOSIS — N952 Postmenopausal atrophic vaginitis: Secondary | ICD-10-CM

## 2019-07-17 NOTE — Patient Instructions (Signed)
Follow-up in 1 year for annual exam, sooner if any issues. 

## 2019-07-17 NOTE — Progress Notes (Signed)
    [name] 03-04-1941 [id number]        77 y.o.  [id number] for breast and pelvic exam.  Without gynecologic complaints  Past medical history,surgical history, problem list, medications, allergies, family history and social history were all reviewed and documented as reviewed in the EPIC chart.  ROS:  Performed with pertinent positives and negatives included in the history, assessment and plan.   Additional significant findings : None   Exam: [name] assistant Vitals:   07/17/19 1050  BP: 122/66  Weight: 138 lb (62.6 kg)  Height: 5\' 3"  (1.6 m)   Body mass index is 24.45 kg/m.  General appearance:  Normal affect, orientation and appearance. Skin: Grossly normal HEENT: Without gross lesions.  No cervical or supraclavicular adenopathy. Thyroid normal.  Lungs:  Clear without wheezing, rales or rhonchi Cardiac: RR, without RMG Abdominal:  Soft, nontender, without masses, guarding, rebound, organomegaly or hernia Breasts:  Examined lying and sitting without masses, retractions, discharge or axillary adenopathy.  Left nipple inverted and easily reverts as always. Pelvic:  Ext, BUS, Vagina: With atrophic changes.  Minimal cystocele.  Cuff well supported.  Adnexa: Without masses or tenderness    Anus and perineum: Normal   Rectovaginal: Normal sphincter tone without palpated masses or tenderness.    Assessment/Plan:  78 y.o. [id number] female for breast and pelvic exam.  Status post TVH A&P repair in the past.  1. Postmenopausal.  No significant menopausal symptoms. 2. History of minimal cystocele.  Asymptomatic to the patient.  Stable on serial exams. 3. Mammography 05/2019.  Continue with annual mammography when due.  Breast exam normal today. 4. Colonoscopy 2017.  Repeat at their recommended interval. 5. DEXA 2018.  Followed through her primary provider's office.  Has appointment in November and will check when they want to repeat this. 6. Pap smear 2011.  No Pap smear done  today.  No history of significant abnormal Pap smears.  We both agree to stop screening per current screening guidelines. 7. Health maintenance.  No routine lab work done as patient does this elsewhere.  Follow-up 1 year, sooner as needed.   Anastasio Auerbach MD, 11:10 AM 07/17/2019

## 2019-07-22 ENCOUNTER — Other Ambulatory Visit: Payer: Self-pay

## 2019-07-22 ENCOUNTER — Ambulatory Visit (INDEPENDENT_AMBULATORY_CARE_PROVIDER_SITE_OTHER): Payer: Medicare Other | Admitting: Cardiology

## 2019-07-22 ENCOUNTER — Encounter: Payer: Self-pay | Admitting: Cardiology

## 2019-07-22 VITALS — BP 120/58 | HR 75 | Ht 63.0 in | Wt 139.8 lb

## 2019-07-22 DIAGNOSIS — I4819 Other persistent atrial fibrillation: Secondary | ICD-10-CM | POA: Diagnosis not present

## 2019-07-22 NOTE — Progress Notes (Signed)
Electrophysiology Office Note   Date:  07/22/2019   ID:  [name], [name] 05/26/1941, MRN [id number]  PCP:  Biagio Borg, MD  Primary Electrophysiologist:  Constance Haw, MD    No chief complaint on file.    History of Present Illness: [name] is a 78 y.o. female who presents today for electrophysiology evaluation.   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 Admitted 08/17/16 with ruptured colon status post right hemicolectomy with ileostomy for ischemic and necrotic colon.  Cardioversion 10/11/16. He also had ileostomy takedown and repair that was complicated by C. difficile.  Today, denies symptoms of palpitations, chest pain, shortness of breath, orthopnea, PND, lower extremity edema, claudication, dizziness, presyncope, syncope, bleeding, or neurologic sequela. The patient is tolerating medications without difficulties.  Overall she is doing well.  She has no chest pain or shortness of breath.  She is able to do all of her daily activities without restriction.  Past Medical History:  Diagnosis Date  . Allergic rhinitis   . Anxiety   . Atrial septal aneurysm   . Basal cell carcinoma   . Bradycardia   . Cataract    bil cataracts removed  . Chronic combined systolic and diastolic CHF (congestive heart failure) ([place])   . CKD (chronic kidney disease), stage III ([place])   . Clostridium difficile infection   . COPD (chronic obstructive pulmonary disease) ([place])   . Depression 11/29/2013  . DI (detrusor instability)   . Esophageal stricture   . GERD (gastroesophageal reflux disease)   . Hemorrhoids   . Hiatal hernia   . History of kidney stones   .  HLD (hyperlipidemia)   . HTN (hypertension)   . Ischemic colon ([place])    a. 08/2016 - adm to  [facility] with a ruptured colon s/p right hemicolectomy with ileostomy formation for ischemic and necrotic colon.   . Lung nodule    a. followed by pulmonology  . Mild CAD    a. minor nonobstructive CAD 08/22/16.  . Nephrolithiasis    hx  . NICM (nonischemic cardiomyopathy) ([place])   . Orthostatic hypotension   . Osteoarthritis   . PAF (paroxysmal atrial fibrillation) ([place])    a. diagnosed 07/2016 s/p DCCV 08/01/16 with recurrence, managed with rate control for now.   . Pneumonia 2013  . PVC's (premature ventricular contractions)   . Shingles since Nov 18, 2013   on right arm from shoulder to wrist  . Status post dilation of esophageal narrowing   . Stroke [facility]) 2001   STROKES, TIA'S  . Syncope    a. 08/2016 felt due to orthostatic hypotension.  Marland Kitchen TIA (transient ischemic attack) last 2001   anticoagulation therapy on coumadin   Past Surgical History:  Procedure Laterality Date  . ANTERIOR LATERAL LUMBAR FUSION 4 LEVELS Right 07/03/2015   Procedure: Right Lumbar one-two, Lumbar two-three, Lumbar three-four, Lumbar four-five Anterior lateral lumbar interbody fusion;  Surgeon: Erline Levine, MD;  Location: [place] NEURO ORS;  Service: Neurosurgery;  Laterality: Right;  Right L1-2 L2-3 L3-4 L4-5 Anterior lateral lumbar interbody fusion  . BALLOON DILATION N/A 03/03/2014   Procedure: BALLOON DILATION;  Surgeon: Inda Castle, MD;  Location: [facility] ENDOSCOPY;  Service: Endoscopy;  Laterality: N/A;  . BLADDER SUSPENSION    . BREAST BIOPSY    . CARDIAC CATHETERIZATION N/A 08/22/2016   Procedure: Left Heart Cath and Coronary Angiography;  Surgeon: Peter M Martinique, MD;  Location: [place] CV LAB;  Service: Cardiovascular;  Laterality: N/A;  . CARDIOVERSION N/A 08/01/2016   Procedure: CARDIOVERSION;  Surgeon: Jerline Pain, MD;  Location: [place];  Service: Cardiovascular;  Laterality: N/A;  .  CARDIOVERSION N/A 10/11/2016   Procedure: CARDIOVERSION;  Surgeon: Sueanne Margarita, MD;  Location: [facility] ENDOSCOPY;  Service: Cardiovascular;  Laterality: N/A;  . COLONOSCOPY    . ESOPHAGOGASTRODUODENOSCOPY N/A 03/03/2014   Procedure: ESOPHAGOGASTRODUODENOSCOPY (EGD);  Surgeon: Inda Castle, MD;  Location: [facility];  Service: Endoscopy;  Laterality: N/A;  . EXCISION OF BASAL CELL CA  2011  . FINGER SURGERY    . GLUTEUS MINIMUS REPAIR Right 03/15/2016   Procedure: RIGHT HIP GLUTEUS MEDIUS TENDON REPAIR;  Surgeon: Paralee Cancel, MD;  Location: [facility] ORS;  Service: Orthopedics;  Laterality: Right;  . ileostomy reversal    . LUMBAR PERCUTANEOUS PEDICLE SCREW 4 LEVEL Right 07/03/2015   Procedure: Lumbar one-Sacral one Bilateral percutaneous pedicle screws;  Surgeon: Erline Levine, MD;  Location: [place] NEURO ORS;  Service: Neurosurgery;  Laterality: Right;  L1-S1 Bilateral percutaneous pedicle screws  . RECTOCELE REPAIR  2008   A&P REPAIR with vaginal repair-DR. MCDIARMID  . THUMB SURGERY Right 10-15 yrs ago  . UPPER GASTROINTESTINAL ENDOSCOPY    . VAGINAL HYSTERECTOMY  1983   NO BSO-DR. MABRY  . VARICOSE VEIN SURGERY       Current Outpatient Medications  Medication Sig Dispense Refill  . albuterol (PROVENTIL HFA;VENTOLIN HFA) 108 (90 Base) MCG/ACT inhaler Inhale 1-2 puffs into the lungs every 6 (six) hours as needed for wheezing or shortness of breath. 3 Inhaler 4  . apixaban (ELIQUIS) 5 MG TABS tablet Take 1 tablet (5 mg total) by mouth 2 (two) times daily. 180 tablet 1  . azelastine (ASTELIN) 0.1 % nasal spray Place 2 sprays into the nose daily. Use in each nostril as directed 90 mL 1  . buPROPion (WELLBUTRIN XL) 300 MG 24 hr tablet Take 1 tablet (300 mg total) by mouth daily. 90 tablet 3  . Calcium Carbonate-Vitamin D (CALCIUM + D PO) Take 1 tablet by mouth 2 (two) times daily.     . colestipol (COLESTID) 1 g tablet Take 2 tablets (2 g total) by mouth 2 (two) times daily. 360 tablet 3  .  cyclobenzaprine (FLEXERIL) 5 MG tablet Take 1 tablet (5 mg total) by mouth 3 (three) times daily as needed for muscle spasms. 270 tablet 1  . ezetimibe (ZETIA) 10 MG tablet Take 1 tablet (10 mg total) by mouth daily. 90 tablet 3  . fluocinonide cream (LIDEX) 8.93 % Apply 1 application topically 2 (two) times daily. 30 g 0  . fluticasone (FLONASE) 50 MCG/ACT nasal spray Place 1 spray into both nostrils 2 (two) times daily. 48 g 1  . Fluticasone-Umeclidin-Vilant (TRELEGY ELLIPTA) 100-62.5-25 MCG/INH AEPB Inhale 1 puff into the lungs daily. I inhalation once daily 180 each 1  . furosemide (LASIX) 20 MG tablet Take 1 tablet (20 mg total) by mouth daily as needed (swelling). 90 tablet 3  . gabapentin (NEURONTIN) 300 MG capsule 1 tab by mouth in AM, 1 po at midday, then 2 by mouth at bedtime for sleep and pain (Patient taking differently: 1 tab by mouth in AM,  2 by mouth at bedtime for sleep  and pain) 360 capsule 1  . guaiFENesin (MUCINEX) 600 MG 12 hr tablet Take 1,200 mg by mouth 2 (two) times daily as needed for cough or to loosen phlegm.    . hydroxychloroquine (PLAQUENIL) 200 MG tablet Take 400 mg by mouth at bedtime.    Marland Kitchen ipratropium (ATROVENT) 0.03 % nasal spray Place 2 sprays into both nostrils 3 (three) times daily as needed for rhinitis. 90 mL 1  . levothyroxine (SYNTHROID, LEVOTHROID) 25 MCG tablet Take 1 tablet (25 mcg total) by mouth daily before breakfast. 90 tablet 3  . Lidocaine HCl 2 % CREA Apply 1 application topically daily as needed (for shingles flares).    Marland Kitchen loperamide (IMODIUM) 2 MG capsule Take by mouth.    . Melatonin 5 MG TABS Take 5 mg by mouth at bedtime.    . metoprolol succinate (TOPROL-XL) 100 MG 24 hr tablet Take 1 tablet (100 mg total) by mouth daily. Take with or immediately following a meal. 90 tablet 2  . mirabegron ER (MYRBETRIQ) 50 MG TB24 tablet Take 50 mg by mouth daily.    . montelukast (SINGULAIR) 10 MG tablet Take 1 tablet (10 mg total) by mouth daily. 90 tablet  3  . Multiple Vitamin (MULTIVITAMIN) tablet Take 1 tablet by mouth daily.      Marland Kitchen omeprazole (PRILOSEC) 40 MG capsule Take 1 capsule (40 mg total) by mouth daily. 90 capsule 3  . Probiotic Product (PROBIOTIC DAILY PO) Take by mouth.    . traZODone (DESYREL) 50 MG tablet Take 1 tablet (50 mg total) by mouth at bedtime and may repeat dose one time if needed. 180 tablet 1   No current facility-administered medications for this visit.     Allergies:   Clarithromycin, Lipitor [atorvastatin], Simvastatin, Cardizem [diltiazem hcl], Contrast media [iodinated diagnostic agents], Crestor [rosuvastatin calcium], and Rosuvastatin calcium   Social History:  The patient  reports that she quit smoking about 23 years ago. Her smoking use included cigarettes. She started smoking about 65 years ago. She has a 126.00 pack-year smoking history. She has never used smokeless tobacco. She reports that she does not drink alcohol or use drugs.   Family History:  The patient's family history includes Breast cancer in her maternal aunt, maternal aunt, maternal aunt, and mother; COPD in her mother; Emphysema in her mother; Uterine cancer in her mother.    ROS:  Please see the history of present illness.   Otherwise, review of systems is positive for none.   All other systems are reviewed and negative.   PHYSICAL EXAM: VS:  BP (!) 120/58   Pulse 75   Ht 5\' 3"  (1.6 m)   Wt 139 lb 12.8 oz (63.4 kg)   SpO2 93%   BMI 24.76 kg/m  , BMI Body mass index is 24.76 kg/m. GEN: Well nourished, well developed, in no acute distress  HEENT: normal  Neck: no JVD, carotid bruits, or masses Cardiac: RRR; no murmurs, rubs, or gallops,no edema  Respiratory:  clear to auscultation bilaterally, normal work of breathing GI: soft, nontender, nondistended, + BS MS: no deformity or atrophy  Skin: warm and dry Neuro:  Strength and sensation are intact Psych: euthymic mood, full affect  EKG:  EKG is ordered today. Personal review of  the ekg ordered shows sinus rhythm, PVCs  Recent Labs: 10/17/2018: ALT 15; BUN 23; Creatinine, Ser 1.03; Hemoglobin 13.2; Platelets 198.0; Potassium 3.8; Sodium 141; TSH 1.07    Lipid Panel     Component Value  Date/Time   CHOL 212 (H) 10/17/2018 1420   TRIG 341.0 (H) 10/17/2018 1420   HDL 55.00 10/17/2018 1420   CHOLHDL 4 10/17/2018 1420   VLDL 68.2 (H) 10/17/2018 1420   LDLCALC 165 (H) 11/12/2015 1142   LDLDIRECT 133.0 10/17/2018 1420     Wt Readings from Last 3 Encounters:  07/22/19 139 lb 12.8 oz (63.4 kg)  07/17/19 138 lb (62.6 kg)  12/21/18 139 lb (63 kg)      Other studies Reviewed: Additional studies/ records that were reviewed today include: TTE 04/05/17, Cath 08/22/16, Telemetry 06/14/16  Review of the above records today demonstrates:  TTE - Left ventricle: The cavity size was normal. Wall thickness was   normal. Systolic function was mildly reduced. The estimated   ejection fraction was in the range of 45% to 50%. Diffuse   hypokinesis. Doppler parameters are consistent with abnormal left   ventricular relaxation (grade 1 diastolic dysfunction). Doppler   parameters are consistent with high ventricular filling pressure. - Aortic valve: There was mild stenosis. - Mitral valve: There was moderate regurgitation. - Left atrium: The atrium was mildly dilated. - Pulmonary arteries: Systolic pressure was mildly increased. PA   peak pressure: 40 mm Hg (S).   Cath  Prox LAD to Mid LAD lesion, 15 %stenosed.  Ost Cx to Prox Cx lesion, 15 %stenosed.  Prox RCA to Mid RCA lesion, 20 %stenosed.  There is mild left ventricular systolic dysfunction.  LV end diastolic pressure is normal.  The left ventricular ejection fraction is 45-50% by visual estimate.   1. Minor nonobstructive CAD 2. Mild LV dysfunction with distal inferior HK 3. Measurement of LV pressures limited by frequent PVCs but EDP appears normal.   Telemetry 1. NSR with PVC's, at times in a bigeminal  manner 2. Signal drop out as well as noise artifact are present.  3. No sustained atrial or ventricular arrhythmias 4. No pauses   ASSESSMENT AND PLAN:  1.  Paroxysmal atrial fibrillation: Currently on Eliquis and Toprol-XL.  Has been taken off of amiodarone.  Taking Toprol-XL at night has helped with her fatigue.  No changes.  This patients CHA2DS2-VASc Score and unadjusted Ischemic Stroke Rate (% per year) is equal to 7.2 % stroke rate/year from a score of 5  Above score calculated as 1 point each if present [CHF, HTN, DM, Vascular=MI/PAD/Aortic Plaque, Age if 65-74, or Female] Above score calculated as 2 points each if present [Age > 75, or Stroke/TIA/TE]   2. Chronic combined systolic HF: Continue Toprol-XL.  No signs of volume overload.  3. HTN: Currently well controlled  4. PVCs: Asymptomatic   Current medicines are reviewed at length with the patient today.   The patient does not have concerns regarding her medicines.  The following changes were made today: None  Labs/ tests ordered today include:  Orders Placed This Encounter  Procedures  . EKG 12-Lead     Disposition:   FU with Sharvi Mooneyhan 6 months  Signed, Bonney Berres Meredith Leeds, MD  07/22/2019 3:05 PM     [facility] [address] [place] [address] [phone] (office) [phone] (fax)

## 2019-07-23 ENCOUNTER — Telehealth: Payer: Self-pay | Admitting: Adult Health

## 2019-07-23 ENCOUNTER — Encounter: Payer: Self-pay | Admitting: Adult Health

## 2019-07-23 ENCOUNTER — Ambulatory Visit (INDEPENDENT_AMBULATORY_CARE_PROVIDER_SITE_OTHER): Payer: Medicare Other | Admitting: Adult Health

## 2019-07-23 DIAGNOSIS — J449 Chronic obstructive pulmonary disease, unspecified: Secondary | ICD-10-CM

## 2019-07-23 DIAGNOSIS — J310 Chronic rhinitis: Secondary | ICD-10-CM

## 2019-07-23 MED ORDER — AZITHROMYCIN 250 MG PO TABS: ORAL_TABLET | ORAL | 0 refills | Status: AC

## 2019-07-23 NOTE — Progress Notes (Signed)
Reviewed, agree 

## 2019-07-23 NOTE — Progress Notes (Signed)
Virtual Visit via Telephone Note  I connected with [name] on 07/23/19 at  1:30 PM EDT by telephone and verified that I am speaking with the correct person using two identifiers.  Location: Patient: Home  Provider: Office    I discussed the limitations, risks, security and privacy concerns of performing an evaluation and management service by telephone and the availability of in person appointments. I also discussed with the patient that there may be a patient responsible charge related to this service. The patient expressed understanding and agreed to proceed.   History of Present Illness: 78 year old female former smoker followed for severe COPD, chronic rhinitis and GERD.  Stable lung nodule (serial CT chest from 2015 01/2017 showed stable 7 mm right lower lobe nodule consistent with benign etiology) Medical history significant for chronic combined congestive heart failure, A. fib (cardioversion 5681) , embolic CVA, chronic Coumadin Previous colon rupture: Requiring right hemicolectomy with ileostomy for ischemic and necrotic colon.  2017.  Previous ileostomy takedown and repair that was complicated by C. Difficile. Arthritis on Plaquenil  Today tele-visit as an acute office visit. Patient has underlying severe COPD.  She is on Trelegy daily.  She complains over the last 2 weeks that she has had increased congestion cough with thick mucus at times it is green.  She says symptoms have been worse for the last few days.  She denies any fever, chest pain, orthopnea or increased edema.  She denies any known sick contacts. Has been using Mucinex with minimal help.  Denies any increased sinus drainage.  Continues on her current regimen with Astelin and Flonase and Claritin.  Says her O2 saturations at home have been good 91 to 96% on room air   Observations/Objective: PFT 10/10/16:FVC 1.94 L (70%) FEV1 0.93 L (45%) FEV1/FVC 0.48 FEF 25-75 0.35 L (22%) positive bronchodilator  response 03/01/16: FVC 2.55 L (91%) FEV1 1.30 L (62%) FEV1/FVC 0.51 FEF 25-75 0.56 L (34%) negative bronchodilator response TLC 5.66 L (111%) RV 116% ERV 133% DLCO uncorrected 62% (hemoglobin 15.1) 12/28/11:FVC 2.89 L (104%) FEV1 1.22 L (62%) FEV1/FVC 0.42 FEF 25-75 0.33 L (15%) negative bronchodilator response TLC 6.19 L (129%) RV 163% ERV 114% DLCO uncorrected 69%  6MWT 10/10/16: Walked 96 meters / Baseline Sat 93% on RA / Nadir Sat 93% on RA @ rest (couldn't finish due to weakness &dyspnea W/ 2:57 left) 08/24/16: Walked 1 lap &was 96% at lowest on room air  IMAGING CXR PA/LAT 06/19/17 :    CXR PA/LAT 05/18/17: Patchy opacification in right middle and lower lobes. No pleural effusion appreciated. Heart normal in size & mediastinum normal in contour.  CT CHEST W/O 01/23/17:Resolution of groundglass opacity. 7 mm right lower lobe nodule unchanged. Apical predominant emphysematous changes. Right middle lobe nodule was not appreciated on this image. No pleural effusion or thickening. No pathologic mediastinal adenopathy. No pericardial effusion.  CTA CHEST 08/17/16:No evidence of PE. No pleural effusion or thickening. No pathologic mediastinal adenopathy. Groundglass noted within right lung predominantly in the upper lung zone. Moderate upper lobe predominant emphysema which has progressed since 2015. 7 mm right lower lobe nodule has not changed since 2015. Patient has a 4 mm nodule within right middle lobe which appears new.  CXR PA/LAT 05/25/16:Hyperinflation with flattening of the diaphragms. No parenchymal opacity or mass appreciated. No pleural effusion. Heart normal in size &mediastinum normal in contour.  CXR PA/LAT 03/22/16: Hyperinflation with elevation of left hemidiaphragm. No new focal opacity. Heart normal in size &  mediastinum normal in contour.  CXR PA/LAT 11/09/15: No focal opacity or effusion appreciated. Heart normal in size. Mediastinum normal in contour. Mild  hyperinflation with flattening of the diaphragms.   CARDIAC TTE (06/24/16):LVEF 45-50% with grade 2 diastolic dysfunction. LA moderately to severely dilated. RA normal in size. RV normal in size and function. No aortic stenosis or regurgitation. Moderate centrally directed mitral regurgitation. Paradoxical ventricular septal motion consistent with intraventricular conduction delay. No pulmonic regurgitation. No tricuspid regurgitation. No pericardial effusion.  MICROBIOLOGY Sputum Culture (07/05/17): Candida Species / AFB negative / Moderate Squamous Epithelials   LABS 12/22/15 Alpha-1 antitrypsin: MM (143) 11/2017 IgE 9 (normal)   Assessment and Plan: COPD exacerbation with URI/bronchitis.  Low suspicion for COVID-19 as she has no fever known sick contacts and is been social distancing.  Plan  Patient Instructions  Begin a Z-Pak take as directed Mucinex DM twice daily as needed for cough and congestion Fluids and rest Continue on Trelegy 1 puff daily, rinse after use Follow up with Dr. Lake Bells in 2-3 months and As needed   Please contact office for sooner follow up if symptoms do not improve or worsen or seek emergency care        Follow Up Instructions: Follow-up in 2 to 3 months and as needed  Please contact office for sooner follow up if symptoms do not improve or worsen or seek emergency care     I discussed the assessment and treatment plan with the patient. The patient was provided an opportunity to ask questions and all were answered. The patient agreed with the plan and demonstrated an understanding of the instructions.   The patient was advised to call back or seek an in-person evaluation if the symptoms worsen or if the condition fails to improve as anticipated.  I provided 22 minutes of non-face-to-face time during this encounter.   Rexene Edison, NP

## 2019-07-23 NOTE — Telephone Encounter (Signed)
Called and spoke to pt. Pt c/o increase in SOB, prod cough with green mucus (small amount), occassional chest tightness with activity that resolves with rest  - all s/s have been present x 2 weeks. Pt denies f/c/s, swelling. Pt was seen yesterday by cards and was noted to have clear lung sounds. Pt states she is taking her daily Singulair and Trelegy but is not taking mucinex. Pt states her SpO2 is 90-91% on RA with activity and around 93% on RA at rest. Pt had a televisit with TP in 04/2019 and advised to f/u in 6 months. Pt is requesting recs from TP.   Tammy, please advise. Thanks.

## 2019-07-23 NOTE — Telephone Encounter (Signed)
She needs visit -can be video visit since sick -to further evaluate .  Please set up with APP   Please contact office for sooner follow up if symptoms do not improve or worsen or seek emergency care

## 2019-07-23 NOTE — Patient Instructions (Addendum)
Begin a Z-Pak take as directed Mucinex DM twice daily as needed for cough and congestion Fluids and rest Continue on Trelegy 1 puff daily, rinse after use Follow up with Dr. Lake Bells in 2-3 months and As needed   Please contact office for sooner follow up if symptoms do not improve or worsen or seek emergency care

## 2019-07-23 NOTE — Telephone Encounter (Signed)
Called and spoke to pt. Informed her of the recs per TP. Pt verbalized understanding but declined a video visit stating she is not comfortable with the video visit. I offered a telephone visit, pt was agreeable. Appt made with TP today at 1330. Pt states she preferred TP to other APPs since she is familiar with TP. Nothing further needed at this time.

## 2019-08-07 NOTE — Progress Notes (Addendum)
Subjective:   [name] is a 78 y.o. female who presents for Medicare Annual (Subsequent) preventive examination.  Review of Systems:   Cardiac Risk Factors include: advanced age (>9men, >58 women);hypertension;dyslipidemia Sleep patterns: feels rested on waking, gets up 1 times nightly to void and sleeps 7hours nightly.    Home Safety/Smoke Alarms: Feels safe in home. Smoke alarms in place.  Living environment; residence and Firearm Safety: 1-story house/ trailer, equipment: Radio producer, Type: [place]. Lives with husband, no needs for DME, limited support system Seat Belt Safety/Bike Helmet: Wears seat belt.     Objective:     Vitals: BP 126/68   Pulse 64   Resp 17   Ht 5\' 3"  (1.6 m)   Wt 135 lb (61.2 kg)   SpO2 98%   BMI 23.91 kg/m   Body mass index is 23.91 kg/m.  Advanced Directives 08/08/2019 07/27/2018 12/20/2016 10/11/2016 09/26/2016 08/23/2016 08/10/2016  Does Patient Have a Medical Advance Directive? Yes Yes No No No No No  Type of Paramedic of [place];Living will [place];Living will - - - - -  Copy of [place] in Chart? No - copy requested No - copy requested - - - - -  Would patient like information on creating a medical advance directive? - - No - Patient declined - Yes - Educational materials given Yes - Scientist, clinical (histocompatibility and immunogenetics) given -  Pre-existing out of facility DNR order (yellow form or pink MOST form) - - - - - - -    Tobacco Social History   Tobacco Use  Smoking Status Former Smoker  . Packs/day: 3.00  . Years: 42.00  . Pack years: 126.00  . Types: Cigarettes  . Start date: 08/04/1954  . Quit date: 12/06/1995  . Years since quitting: 23.6  Smokeless Tobacco Never Used     Counseling given: Not Answered  Past Medical History:  Diagnosis Date  . Allergic rhinitis   . Anxiety   . Atrial septal aneurysm   . Basal cell carcinoma   . Bradycardia   . Cataract    bil cataracts  removed  . Chronic combined systolic and diastolic CHF (congestive heart failure) ([place])   . CKD (chronic kidney disease), stage III ([place])   . Clostridium difficile infection   . COPD (chronic obstructive pulmonary disease) ([place])   . Depression 11/29/2013  . DI (detrusor instability)   . Esophageal stricture   . GERD (gastroesophageal reflux disease)   . Hemorrhoids   . Hiatal hernia   . History of kidney stones   . HLD (hyperlipidemia)   . HTN (hypertension)   . Ischemic colon ([place])    a. 08/2016 - adm to  [facility] with a ruptured colon s/p right hemicolectomy with ileostomy formation for ischemic and necrotic colon.   . Lung nodule    a. followed by pulmonology  . Mild CAD    a. minor nonobstructive CAD 08/22/16.  . Nephrolithiasis    hx  . NICM (nonischemic cardiomyopathy) ([place])   . Orthostatic hypotension   . Osteoarthritis   . PAF (paroxysmal atrial fibrillation) ([place])    a. diagnosed 07/2016 s/p DCCV 08/01/16 with recurrence, managed with rate control for now.   . Pneumonia 2013  . PVC's (premature ventricular contractions)   . Shingles since Nov 18, 2013   on right arm from shoulder to wrist  . Status post dilation of esophageal narrowing   . Stroke [facility]) 2001  STROKES, TIA'S  . Syncope    a. 08/2016 felt due to orthostatic hypotension.  Marland Kitchen TIA (transient ischemic attack) last 2001   anticoagulation therapy on coumadin   Past Surgical History:  Procedure Laterality Date  . ANTERIOR LATERAL LUMBAR FUSION 4 LEVELS Right 07/03/2015   Procedure: Right Lumbar one-two, Lumbar two-three, Lumbar three-four, Lumbar four-five Anterior lateral lumbar interbody fusion;  Surgeon: Erline Levine, MD;  Location: [facility] NEURO ORS;  Service: Neurosurgery;  Laterality: Right;  Right L1-2 L2-3 L3-4 L4-5 Anterior lateral lumbar interbody fusion  . BALLOON DILATION N/A 03/03/2014   Procedure: BALLOON DILATION;  Surgeon: Inda Castle, MD;  Location: [facility] ENDOSCOPY;  Service: Endoscopy;   Laterality: N/A;  . BLADDER SUSPENSION    . BREAST BIOPSY    . CARDIAC CATHETERIZATION N/A 08/22/2016   Procedure: Left Heart Cath and Coronary Angiography;  Surgeon: Peter M Martinique, MD;  Location: [facility] CV LAB;  Service: Cardiovascular;  Laterality: N/A;  . CARDIOVERSION N/A 08/01/2016   Procedure: CARDIOVERSION;  Surgeon: Jerline Pain, MD;  Location: [facility];  Service: Cardiovascular;  Laterality: N/A;  . CARDIOVERSION N/A 10/11/2016   Procedure: CARDIOVERSION;  Surgeon: Sueanne Margarita, MD;  Location: [facility] ENDOSCOPY;  Service: Cardiovascular;  Laterality: N/A;  . COLONOSCOPY    . ESOPHAGOGASTRODUODENOSCOPY N/A 03/03/2014   Procedure: ESOPHAGOGASTRODUODENOSCOPY (EGD);  Surgeon: Inda Castle, MD;  Location: [facility];  Service: Endoscopy;  Laterality: N/A;  . EXCISION OF BASAL CELL CA  2011  . FINGER SURGERY    . GLUTEUS MINIMUS REPAIR Right 03/15/2016   Procedure: RIGHT HIP GLUTEUS MEDIUS TENDON REPAIR;  Surgeon: Paralee Cancel, MD;  Location: [facility] ORS;  Service: Orthopedics;  Laterality: Right;  . ileostomy reversal    . LUMBAR PERCUTANEOUS PEDICLE SCREW 4 LEVEL Right 07/03/2015   Procedure: Lumbar one-Sacral one Bilateral percutaneous pedicle screws;  Surgeon: Erline Levine, MD;  Location: [facility];  Service: Neurosurgery;  Laterality: Right;  L1-S1 Bilateral percutaneous pedicle screws  . RECTOCELE REPAIR  2008   A&P REPAIR with vaginal repair-DR. MCDIARMID  . THUMB SURGERY Right 10-15 yrs ago  . UPPER GASTROINTESTINAL ENDOSCOPY    . VAGINAL HYSTERECTOMY  1983   NO BSO-DR. MABRY  . VARICOSE VEIN SURGERY     Family History  Problem Relation Age of Onset  . COPD Mother   . Breast cancer Mother        mid 55's  . Uterine cancer Mother   . Emphysema Mother   . Breast cancer Maternal Aunt        x 3 aunts, post menopausal  . Breast cancer Maternal Aunt   . Breast cancer Maternal Aunt   . Colon cancer Neg Hx   . Esophageal cancer Neg Hx   . Rectal cancer Neg Hx   .  Stomach cancer Neg Hx   . Pancreatic cancer Neg Hx    Social History   Socioeconomic History  . Marital status: Married    Spouse name: Shanon Brow  . Number of children: 2  . Years of education: Not on file  . Highest education level: Not on file  Occupational History  . Occupation: retired    Fish farm manager: RETIRED  Social Needs  . Financial resource strain: Not hard at all  . Food insecurity    Worry: Never true    Inability: Never true  . Transportation needs    Medical: No    Non-medical: No  Tobacco Use  . Smoking status: Former Smoker  Packs/day: 3.00    Years: 42.00    Pack years: 126.00    Types: Cigarettes    Start date: 08/04/1954    Quit date: 12/06/1995    Years since quitting: 23.6  . Smokeless tobacco: Never Used  Substance and Sexual Activity  . Alcohol use: No    Alcohol/week: 0.0 standard drinks  . Drug use: No  . Sexual activity: Not Currently    Birth control/protection: Surgical    Comment: 1st intercourse 78 yo-Fewer than 5 partners  Lifestyle  . Physical activity    Days per week: 0 days    Minutes per session: 0 min  . Stress: Only a little  Relationships  . Social Herbalist on phone: More than three times a week    Gets together: Not on file    Attends religious service: 1 to 4 times per year    Active member of club or organization: Not on file    Attends meetings of clubs or organizations: Not on file    Relationship status: Married  Other Topics Concern  . Not on file  Social History Narrative   Retired- Dentist Pulmonary:   Originally from [place]. Has always lived in [place]. She has traveled to [place], [place], [place], [place], [place], [place]. Has been on multiple cruises. 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 She did use a face mask consistently to avoid chemical fume exposure. She may have only had an inhaled exposure a  couple of times. She currently has a dog. Remote exposure to a parakeet. No mold or hot tub exposure. No asbestos exposure.     Outpatient Encounter Medications as of 08/08/2019  Medication Sig  . albuterol (PROVENTIL HFA;VENTOLIN HFA) 108 (90 Base) MCG/ACT inhaler Inhale 1-2 puffs into the lungs every 6 (six) hours as needed for wheezing or shortness of breath.  Marland Kitchen apixaban (ELIQUIS) 5 MG TABS tablet Take 1 tablet (5 mg total) by mouth 2 (two) times daily.  Marland Kitchen azelastine (ASTELIN) 0.1 % nasal spray Place 2 sprays into the nose daily. Use in each nostril as directed  . buPROPion (WELLBUTRIN XL) 300 MG 24 hr tablet Take 1 tablet (300 mg total) by mouth daily.  . Calcium Carbonate-Vitamin D (CALCIUM + D PO) Take 1 tablet by mouth 2 (two) times daily.   . cyclobenzaprine (FLEXERIL) 5 MG tablet Take 1 tablet (5 mg total) by mouth 3 (three) times daily as needed for muscle spasms.  . fluocinonide cream (LIDEX) AB-123456789 % Apply 1 application topically 2 (two) times daily.  . fluticasone (FLONASE) 50 MCG/ACT nasal spray Place 1 spray into both nostrils 2 (two) times daily.  . Fluticasone-Umeclidin-Vilant (TRELEGY ELLIPTA) 100-62.5-25 MCG/INH AEPB Inhale 1 puff into the lungs daily. I inhalation once daily  . furosemide (LASIX) 20 MG tablet Take 1 tablet (20 mg total) by mouth daily as needed (swelling).  . gabapentin (NEURONTIN) 300 MG capsule 1 tab by mouth in AM, 1 po at midday, then 2 by mouth at bedtime for sleep and pain (Patient taking differently: 1 tab by mouth in AM,  2 by mouth at bedtime for sleep and pain)  . guaiFENesin (MUCINEX) 600 MG 12 hr tablet Take 1,200 mg by mouth 2 (two) times daily as needed for cough or to loosen phlegm.  . hydroxychloroquine (PLAQUENIL) 200 MG tablet Take 400 mg by mouth at bedtime.  Marland Kitchen  ipratropium (ATROVENT) 0.03 % nasal spray Place 2 sprays into both nostrils 3 (three) times daily as needed for rhinitis.  Marland Kitchen levothyroxine (SYNTHROID, LEVOTHROID) 25 MCG tablet Take 1  tablet (25 mcg total) by mouth daily before breakfast.  . Lidocaine HCl 2 % CREA Apply 1 application topically daily as needed (for shingles flares).  Marland Kitchen loperamide (IMODIUM) 2 MG capsule Take by mouth.  . Melatonin 5 MG TABS Take 5 mg by mouth at bedtime.  . metoprolol succinate (TOPROL-XL) 100 MG 24 hr tablet Take 1 tablet (100 mg total) by mouth daily. Take with or immediately following a meal.  . mirabegron ER (MYRBETRIQ) 50 MG TB24 tablet Take 50 mg by mouth daily.  . montelukast (SINGULAIR) 10 MG tablet Take 1 tablet (10 mg total) by mouth daily.  . Multiple Vitamin (MULTIVITAMIN) tablet Take 1 tablet by mouth daily.    . NYSTATIN PO Take 5 mLs by mouth as needed.  Marland Kitchen omeprazole (PRILOSEC) 40 MG capsule Take 1 capsule (40 mg total) by mouth daily.  . Probiotic Product (PROBIOTIC DAILY PO) Take by mouth.  . traZODone (DESYREL) 50 MG tablet Take 1 tablet (50 mg total) by mouth at bedtime and may repeat dose one time if needed.  . colestipol (COLESTID) 1 g tablet Take 2 tablets (2 g total) by mouth 2 (two) times daily.  Marland Kitchen ezetimibe (ZETIA) 10 MG tablet Take 1 tablet (10 mg total) by mouth daily.   No facility-administered encounter medications on file as of 08/08/2019.     Activities of Daily Living In your present state of health, do you have any difficulty performing the following activities: 08/08/2019  Hearing? N  Vision? N  Difficulty concentrating or making decisions? N  Walking or climbing stairs? N  Dressing or bathing? N  Doing errands, shopping? N  Preparing Food and eating ? N  Using the Toilet? N  In the past six months, have you accidently leaked urine? N  Do you have problems with loss of bowel control? Y  Managing your Medications? N  Managing your Finances? N  Housekeeping or managing your Housekeeping? N  Some recent data might be hidden    Patient Care Team: Biagio Borg, MD as PCP - General Curt Bears, Ocie Doyne, MD as PCP - Electrophysiology (Cardiology) Irene Shipper, MD as Consulting Physician (Gastroenterology) Governor Rooks., DO as Referring Physician (Surgery) Fontaine, Belinda Block, MD as Consulting Physician (Gynecology) Lynnell Dike, OD (Optometry)    Assessment:   This is a routine wellness examination for [place]. Physical assessment deferred to PCP.  Exercise Activities and Dietary recommendations Current Exercise Habits: The patient does not participate in regular exercise at present, Exercise limited by: orthopedic condition(s)  Diet (meal preparation, eat out, water intake, caffeinated beverages, dairy products, fruits and vegetables): in general, a "healthy" diet  , on average, 2 meals per day. Reports poor appetite and chronic diarrhea dur to previous ileostomy and ileostomy reversal. Unable to supplement with most OTC products as they cause increase diarrhea.   Encouraged patient to increase daily water and healthy fluid intake. Discussed eating small frequent meals high in protein. Nurse will message PCP to inquire about dietician.    Goals      Patient Stated   . <enter goal here> (pt-stated)     "to get well again"      Other   . Patient Stated     Increase my physical activity by doing exercises to increase strength in  my arms and legs.     . Patient Stated     Stay as healthy and as independent as possible.        Fall Risk Fall Risk  08/08/2019 04/19/2019 07/27/2018 10/13/2017 04/12/2017  Falls in the past year? 0 0 Yes No Yes  Number falls in past yr: 0 - 1 - 2 or more  Comment - - - - known cardiac related,  Injury with Fall? - - No - No  Risk for fall due to : - - Impaired mobility;Impaired balance/gait - -  Follow up - - Falls prevention discussed - -   Is the patient's home free of loose throw rugs in walkways, pet beds, electrical cords, etc?   yes      Grab bars in the bathroom? yes      Handrails on the stairs?   yes      Adequate lighting?   yes   Depression Screen PHQ 2/9 Scores 08/08/2019 04/19/2019  07/27/2018 10/13/2017  PHQ - 2 Score 1 1 1  0  PHQ- 9 Score 4 - 9 0     Cognitive Function MMSE - Mini Mental State Exam 07/27/2018  Orientation to time 5  Orientation to Place 5  Registration 3  Attention/ Calculation 5  Recall 2  Language- name 2 objects 2  Language- repeat 1  Language- follow 3 step command 3  Language- read & follow direction 1  Write a sentence 1  Copy design 1  Total score 29     6CIT Screen 08/08/2019  What Year? 0 points  What month? 0 points  What time? 0 points  Count back from 20 0 points  Months in reverse 0 points  Repeat phrase 2 points  Total Score 2    Immunization History  Administered Date(s) Administered  . Fluad Quad(high Dose 65+) 08/08/2019  . Influenza Split 09/11/2012  . Influenza Whole 09/15/2006, 08/04/2010, 08/11/2011  . Influenza, High Dose Seasonal PF 08/22/2017, 09/05/2018  . Influenza,inj,Quad PF,6+ Mos 09/02/2013, 08/26/2014, 08/19/2015, 08/23/2016  . Pneumococcal Conjugate-13 11/29/2013  . Pneumococcal Polysaccharide-23 12/06/2003, 12/05/2006  . Td 12/05/2004  . Zoster 07/07/2008  . Zoster Recombinat (Shingrix) 04/12/2017, 06/19/2017   Screening Tests Health Maintenance  Topic Date Due  . INFLUENZA VACCINE  07/06/2019  . TETANUS/TDAP  06/04/2026  . DEXA SCAN  Completed  . PNA vac Low Risk Adult  Completed  . PAP SMEAR-Modifier  Discontinued      Plan:      Reviewed health maintenance screenings with patient today and relevant education, vaccines, and/or referrals were provided.   I have personally reviewed and noted the following in the patient's chart:   . Medical and social history . Use of alcohol, tobacco or illicit drugs  . Current medications and supplements . Functional ability and status . Nutritional status . Physical activity . Advanced directives . List of other physicians . Vitals . Screenings to include cognitive, depression, and falls . Referrals and appointments  In addition, I have  reviewed and discussed with patient certain preventive protocols, quality metrics, and best practice recommendations. A written personalized care plan for preventive services as well as general preventive health recommendations were provided to patient.     Michiel Cowboy, RN  08/08/2019    Medical screening examination/treatment/procedure(s) were performed by non-physician practitioner and as supervising physician I was immediately available for consultation/collaboration. I agree with above. Cathlean Cower, MD

## 2019-08-08 ENCOUNTER — Other Ambulatory Visit: Payer: Self-pay

## 2019-08-08 ENCOUNTER — Telehealth: Payer: Self-pay | Admitting: *Deleted

## 2019-08-08 ENCOUNTER — Ambulatory Visit (INDEPENDENT_AMBULATORY_CARE_PROVIDER_SITE_OTHER): Payer: Medicare Other | Admitting: *Deleted

## 2019-08-08 VITALS — BP 126/68 | HR 64 | Resp 17 | Ht 63.0 in | Wt 135.0 lb

## 2019-08-08 DIAGNOSIS — E46 Unspecified protein-calorie malnutrition: Secondary | ICD-10-CM

## 2019-08-08 DIAGNOSIS — Z23 Encounter for immunization: Secondary | ICD-10-CM

## 2019-08-08 DIAGNOSIS — Z0000 Encounter for general adult medical examination without abnormal findings: Secondary | ICD-10-CM

## 2019-08-08 NOTE — Patient Instructions (Addendum)
If you cannot attend class in person, you can still exercise at home. Video taped versions of AHOY classes are shown on [employer] (GTN) at 8 am and 1 pm Mondays through Fridays. You can also purchase a copy of the AHOY DVD by calling [place] (GTN) [employer]. GTN is available on [employer] channel 13 with a digital cable box and on [employer] channel 31. GTN is also available on [employer], channel 99. To view GTN, go to channel 99, press OK, select [place], then select GTN to start the channel.  Continue to eat heart healthy diet (full of fruits, vegetables, whole grains, lean protein, water--limit salt, fat, and sugar intake) and increase physical activity as tolerated.\  Continue doing brain stimulating activities (puzzles, reading, adult coloring books, staying active) to keep memory sharp.    [name] , Thank you for taking time to come for your Medicare Wellness Visit. I appreciate your ongoing commitment to your health goals. Please review the following plan we discussed and let me know if I can assist you in the future.   These are the goals we discussed: Goals      Patient Stated   . <enter goal here> (pt-stated)     "to get well again"      Other   . Patient Stated     Increase my physical activity by doing exercises to increase strength in my arms and legs.     . Patient Stated     Stay as healthy and as independent as possible.        This is a list of the screening recommended for you and due dates:  Health Maintenance  Topic Date Due  . Flu Shot  07/06/2019  . Tetanus Vaccine  06/04/2026  . DEXA scan (bone density measurement)  Completed  . Pneumonia vaccines  Completed  . Pap Smear  Discontinued    Preventive Care 75 Years and Older, Female Preventive care refers to lifestyle choices and visits with your health care provider that can promote health and wellness. This includes:  A yearly physical  exam. This is also called an annual well check.  Regular dental and eye exams.  Immunizations.  Screening for certain conditions.  Healthy lifestyle choices, such as diet and exercise. What can I expect for my preventive care visit? Physical exam Your health care provider will check:  Height and weight. These may be used to calculate body mass index (BMI), which is a measurement that tells if you are at a healthy weight.  Heart rate and blood pressure.  Your skin for abnormal spots. Counseling Your health care provider may ask you questions about:  Alcohol, tobacco, and drug use.  Emotional well-being.  Home and relationship well-being.  Sexual activity.  Eating habits.  History of falls.  Memory and ability to understand (cognition).  Work and work Statistician.  Pregnancy and menstrual history. What immunizations do I need?  Influenza (flu) vaccine  This is recommended every year. Tetanus, diphtheria, and pertussis (Tdap) vaccine  You may need a Td booster every 10 years. Varicella (chickenpox) vaccine  You may need this vaccine if you have not already been vaccinated. Zoster (shingles) vaccine  You may need this after age 46. Pneumococcal conjugate (PCV13) vaccine  One dose is recommended after age 61. Pneumococcal polysaccharide (PPSV23) vaccine  One dose is recommended after age 67. Measles, mumps, and rubella (MMR) vaccine  You may need at least one dose of MMR  if you were born in 1957 or later. You may also need a second dose. Meningococcal conjugate (MenACWY) vaccine  You may need this if you have certain conditions. Hepatitis A vaccine  You may need this if you have certain conditions or if you travel or work in places where you may be exposed to hepatitis A. Hepatitis B vaccine  You may need this if you have certain conditions or if you travel or work in places where you may be exposed to hepatitis B. Haemophilus influenzae type b (Hib)  vaccine  You may need this if you have certain conditions. You may receive vaccines as individual doses or as more than one vaccine together in one shot (combination vaccines). Talk with your health care provider about the risks and benefits of combination vaccines. What tests do I need? Blood tests  Lipid and cholesterol levels. These may be checked every 5 years, or more frequently depending on your overall health.  Hepatitis C test.  Hepatitis B test. Screening  Lung cancer screening. You may have this screening every year starting at age 63 if you have a 30-pack-year history of smoking and currently smoke or have quit within the past 15 years.  Colorectal cancer screening. All adults should have this screening starting at age 87 and continuing until age 60. Your health care provider may recommend screening at age 29 if you are at increased risk. You will have tests every 1-10 years, depending on your results and the type of screening test.  Diabetes screening. This is done by checking your blood sugar (glucose) after you have not eaten for a while (fasting). You may have this done every 1-3 years.  Mammogram. This may be done every 1-2 years. Talk with your health care provider about how often you should have regular mammograms.  BRCA-related cancer screening. This may be done if you have a family history of breast, ovarian, tubal, or peritoneal cancers. Other tests  Sexually transmitted disease (STD) testing.  Bone density scan. This is done to screen for osteoporosis. You may have this done starting at age 52. Follow these instructions at home: Eating and drinking  Eat a diet that includes fresh fruits and vegetables, whole grains, lean protein, and low-fat dairy products. Limit your intake of foods with high amounts of sugar, saturated fats, and salt.  Take vitamin and mineral supplements as recommended by your health care provider.  Do not drink alcohol if your health care  provider tells you not to drink.  If you drink alcohol: ? Limit how much you have to 0-1 drink a day. ? Be aware of how much alcohol is in your drink. In the [place] one drink equals one 12 oz bottle of beer (355 mL), one 5 oz glass of wine (148 mL), or one 1 oz glass of hard liquor (44 mL). Lifestyle  Take daily care of your teeth and gums.  Stay active. Exercise for at least 30 minutes on 5 or more days each week.  Do not use any products that contain nicotine or tobacco, such as cigarettes, e-cigarettes, and chewing tobacco. If you need help quitting, ask your health care provider.  If you are sexually active, practice safe sex. Use a condom or other form of protection in order to prevent STIs (sexually transmitted infections).  Talk with your health care provider about taking a low-dose aspirin or statin. What's next?  Go to your health care provider once a year for a well check visit.  Ask  your health care provider how often you should have your eyes and teeth checked.  Stay up to date on all vaccines. This information is not intended to replace advice given to you by your health care provider. Make sure you discuss any questions you have with your health care provider. Document Released: 12/18/2015 Document Revised: 11/15/2018 Document Reviewed: 11/15/2018 Elsevier Patient Education  2020 [employer].

## 2019-08-08 NOTE — Telephone Encounter (Signed)
Ok this is done 

## 2019-08-08 NOTE — Telephone Encounter (Signed)
During AWV, patient stated she is concerned about not getting enough nutrition. Patient reports a poor appetite in general along with chronic diarrhea from prior ileostomy and reversal of ileostomy. She is unable to tolerate OTC supplements because they cause her to have severe diarrhea. Patient reports that she is trying to drink 3 bottles of water daily with other fluids to stay hydrated. [name] would like a dietician referral to help with this issue.

## 2019-08-13 NOTE — Telephone Encounter (Signed)
Called to inform patient that PCP placed a referral for her to see a dietician. Patient verbalized understanding and was appreciative for the callback.

## 2019-08-20 DIAGNOSIS — H5203 Hypermetropia, bilateral: Secondary | ICD-10-CM | POA: Diagnosis not present

## 2019-08-20 DIAGNOSIS — H04123 Dry eye syndrome of bilateral lacrimal glands: Secondary | ICD-10-CM | POA: Diagnosis not present

## 2019-09-12 ENCOUNTER — Encounter: Payer: Self-pay | Admitting: Gynecology

## 2019-09-12 ENCOUNTER — Encounter: Payer: Self-pay | Admitting: Internal Medicine

## 2019-09-12 MED ORDER — GABAPENTIN 300 MG PO CAPS: ORAL_CAPSULE | ORAL | 1 refills | Status: DC

## 2019-09-19 MED ORDER — METOPROLOL SUCCINATE ER 100 MG PO TB24: 100.0000 mg | ORAL_TABLET | Freq: Every day | ORAL | 2 refills | Status: DC

## 2019-09-19 NOTE — Telephone Encounter (Signed)
Pt's medication was sent to pt's pharmacy as requested. Confirmation received.  °

## 2019-09-23 ENCOUNTER — Encounter: Payer: Self-pay | Admitting: Adult Health

## 2019-09-23 ENCOUNTER — Other Ambulatory Visit: Payer: Self-pay

## 2019-09-23 ENCOUNTER — Ambulatory Visit (INDEPENDENT_AMBULATORY_CARE_PROVIDER_SITE_OTHER): Payer: Medicare Other

## 2019-09-23 ENCOUNTER — Ambulatory Visit (INDEPENDENT_AMBULATORY_CARE_PROVIDER_SITE_OTHER): Payer: Medicare Other | Admitting: Adult Health

## 2019-09-23 VITALS — BP 126/76 | HR 84 | Temp 97.9°F | Ht 64.0 in | Wt 138.4 lb

## 2019-09-23 DIAGNOSIS — J438 Other emphysema: Secondary | ICD-10-CM

## 2019-09-23 DIAGNOSIS — I5042 Chronic combined systolic (congestive) and diastolic (congestive) heart failure: Secondary | ICD-10-CM

## 2019-09-23 DIAGNOSIS — J449 Chronic obstructive pulmonary disease, unspecified: Secondary | ICD-10-CM

## 2019-09-23 DIAGNOSIS — K219 Gastro-esophageal reflux disease without esophagitis: Secondary | ICD-10-CM | POA: Diagnosis not present

## 2019-09-23 DIAGNOSIS — J309 Allergic rhinitis, unspecified: Secondary | ICD-10-CM | POA: Diagnosis not present

## 2019-09-23 NOTE — Assessment & Plan Note (Addendum)
Appears compensated without overt overload on exam  Cont follow up with cardiology

## 2019-09-23 NOTE — Assessment & Plan Note (Signed)
Continue on current regimen appears to be controlled  Plan  Patient Instructions  Severe COPD:  Continue Trelegy 1 puff daily Rinse her mouth well after using Trelegy Practice good hand hygiene, social distancing and mask are key .  Stay active as able.  Chest xray today   Gastroesophageal reflux disease: Continue taking omeprazole daily in am .  Continue taking famotidine in evening .   Chronic Rhinitis: Continue taking ipratropium nose spray as directed Continue loratadine 10 mg daily Continue Astelin nose spray daily  Continue fluticasone nose spray daily  Continue on Singulair daily.   We will see you back in 6 months with Dr. Valeta Harms or Render Marley NP and As needed

## 2019-09-23 NOTE — Assessment & Plan Note (Signed)
Controlled on current regimen Continue on GERD diet

## 2019-09-23 NOTE — Patient Instructions (Signed)
Severe COPD:  Continue Trelegy 1 puff daily Rinse her mouth well after using Trelegy Practice good hand hygiene, social distancing and mask are key .  Stay active as able.  Chest xray today   Gastroesophageal reflux disease: Continue taking omeprazole daily in am .  Continue taking famotidine in evening .   Chronic Rhinitis: Continue taking ipratropium nose spray as directed Continue loratadine 10 mg daily Continue Astelin nose spray daily  Continue fluticasone nose spray daily  Continue on Singulair daily.   We will see you back in 6 months with Dr. Valeta Harms or Parrett NP and As needed

## 2019-09-23 NOTE — Progress Notes (Signed)
@Patient  ID: [name], female    DOB: 1941-05-13, 78 y.o.   MRN: [id number]  Chief Complaint  Patient presents with   Follow-up    COPD     Referring provider: Biagio Borg, MD  HPI: 78 year old female former smoker followed for severe COPD, chronic rhinitis and GERD Stable lung nodule (serial CT chest from 2015-2018 showed stable 7 mm right lower lobe nodule consistent with benign etiology) Medical history significant for chronic combined congestive heart failure, A. fib, cardioversion 0000000, embolic CVA, chronic anticoagulation Previous colon rupture.  Requiring right hemicolectomy with ileostomy for ischemic and necrotic colon.  In 2017.  Previous ileostomy takedown and repair that was complicated by C. difficile.  Patient has arthritis on Plaquenil   TEST/EVENTS :  PFT 10/10/16:FVC 1.94 L (70%) FEV1 0.93 L (45%) FEV1/FVC 0.48 FEF 25-75 0.35 L (22%) positive bronchodilator response 03/01/16: FVC 2.55 L (91%) FEV1 1.30 L (62%) FEV1/FVC 0.51 FEF 25-75 0.56 L (34%) negative bronchodilator response TLC 5.66 L (111%) RV 116% ERV 133% DLCO uncorrected 62% (hemoglobin 15.1) 12/28/11:FVC 2.89 L (104%) FEV1 1.22 L (62%) FEV1/FVC 0.42 FEF 25-75 0.33 L (15%) negative bronchodilator response TLC 6.19 L (129%) RV 163% ERV 114% DLCO uncorrected 69%  6MWT 10/10/16: Walked 96 meters / Baseline Sat 93% on RA / Nadir Sat 93% on RA @ rest (couldn't finish due to weakness &dyspnea W/ 2:57 left) 08/24/16: Walked 1 lap &was 96% at lowest on room air  IMAGING CXR PA/LAT 06/19/17 :  CXR PA/LAT 05/18/17: Patchy opacification in right middle and lower lobes. No pleural effusion appreciated. Heart normal in size & mediastinum normal in contour.  CT CHEST W/O 01/23/17:Resolution of groundglass opacity. 7 mm right lower lobe nodule unchanged. Apical predominant emphysematous changes. Right middle lobe nodule was not appreciated on this image. No pleural effusion or thickening. No  pathologic mediastinal adenopathy. No pericardial effusion.  CTA CHEST 08/17/16:No evidence of PE. No pleural effusion or thickening. No pathologic mediastinal adenopathy. Groundglass noted within right lung predominantly in the upper lung zone. Moderate upper lobe predominant emphysema which has progressed since 2015. 7 mm right lower lobe nodule has not changed since 2015. Patient has a 4 mm nodule within right middle lobe which appears new.  CXR PA/LAT 05/25/16:Hyperinflation with flattening of the diaphragms. No parenchymal opacity or mass appreciated. No pleural effusion. Heart normal in size &mediastinum normal in contour.  CXR PA/LAT 03/22/16: Hyperinflation with elevation of left hemidiaphragm. No new focal opacity. Heart normal in size & mediastinum normal in contour.  CXR PA/LAT 11/09/15: No focal opacity or effusion appreciated. Heart normal in size. Mediastinum normal in contour. Mild hyperinflation with flattening of the diaphragms.   CARDIAC TTE (06/24/16):LVEF 45-50% with grade 2 diastolic dysfunction. LA moderately to severely dilated. RA normal in size. RV normal in size and function. No aortic stenosis or regurgitation. Moderate centrally directed mitral regurgitation. Paradoxical ventricular septal motion consistent with intraventricular conduction delay. No pulmonic regurgitation. No tricuspid regurgitation. No pericardial effusion.  MICROBIOLOGY Sputum Culture (07/05/17): Candida Species / AFB negative / Moderate Squamous Epithelials   LABS 12/22/15 Alpha-1 antitrypsin: MM (143) 11/2017 IgE 9 (normal)  09/23/2019 Follow up : COPD , Rhinitis  Patient returns for a 34-month follow-up.  Has underlying severe COPD.  Last visit had a mild COPD flare.  Was treated with a Z-Pak.  Says that symptoms are much improved with decreased cough and congestion.  Says she did have a little bit of pleuritic sensation at the  end of Ray.  Did not have rash.  Previously did have a  possible reaction to clarithromycin. She denies any hemoptysis chest pain orthopnea PND or leg swelling.  She remains on Trelegy daily.  Says overall that she feels that she has been doing better the last few months.  A little bit more energy and has been able to complete activities more than her usual. Patient does have chronic rhinitis.  Is on saline nasal spray, fluticasone, Astelin and ipratropium nasal spray for chronic drippy nose.  Says currently is doing okay.  She also remains on Claritin and Singulair.  Does have reflux on Prilosec and Pepcid.  Denies any current GERD symptoms.  Allergies  Allergen Reactions   Clarithromycin Other (See Comments)    REACTION: possible rash but could have been legionarres dz with rash   Lipitor [Atorvastatin] Other (See Comments)    MUSCLE SPASMS   Simvastatin Other (See Comments)    Muscle and joint pain   Cardizem [Diltiazem Hcl] Hives   Contrast Media [Iodinated Diagnostic Agents]     FYI - during admission at [facility] 08/2016 patient developed hives, question related to cardizem or contrast dye - not clear   Crestor [Rosuvastatin Calcium]     fagitue and joint pain, "NO STATINS"   Rosuvastatin Calcium Other (See Comments)    fagitue and joint pain, "NO STATINS"    Immunization History  Administered Date(s) Administered   Fluad Quad(high Dose 65+) 08/08/2019   Influenza Split 09/11/2012   Influenza Whole 09/15/2006, 08/04/2010, 08/11/2011   Influenza, High Dose Seasonal PF 08/22/2017, 09/05/2018   Influenza,inj,Quad PF,6+ Mos 09/02/2013, 08/26/2014, 08/19/2015, 08/23/2016   Pneumococcal Conjugate-13 11/29/2013   Pneumococcal Polysaccharide-23 12/06/2003, 12/05/2006   Td 12/05/2004   Zoster 07/07/2008   Zoster Recombinat (Shingrix) 04/12/2017, 06/19/2017    Past Medical History:  Diagnosis Date   Allergic rhinitis    Anxiety    Atrial septal aneurysm    Basal cell carcinoma    Bradycardia     Cataract    bil cataracts removed   Chronic combined systolic and diastolic CHF (congestive heart failure) (HCC)    CKD (chronic kidney disease), stage III    Clostridium difficile infection    COPD (chronic obstructive pulmonary disease) ([place])    Depression 11/29/2013   DI (detrusor instability)    Esophageal stricture    GERD (gastroesophageal reflux disease)    Hemorrhoids    Hiatal hernia    History of kidney stones    HLD (hyperlipidemia)    HTN (hypertension)    Ischemic colon ([place])    a. 08/2016 - adm to  [facility] with a ruptured colon s/p right hemicolectomy with ileostomy formation for ischemic and necrotic colon.    Lung nodule    a. followed by pulmonology   Mild CAD    a. minor nonobstructive CAD 08/22/16.   Nephrolithiasis    hx   NICM (nonischemic cardiomyopathy) (HCC)    Orthostatic hypotension    Osteoarthritis    PAF (paroxysmal atrial fibrillation) ([place])    a. diagnosed 07/2016 s/p DCCV 08/01/16 with recurrence, managed with rate control for now.    Pneumonia 2013   PVC's (premature ventricular contractions)    Shingles since Nov 18, 2013   on right arm from shoulder to wrist   Status post dilation of esophageal narrowing    Stroke [facility]) 2001   STROKES, TIA'S   Syncope    a. 08/2016 felt due to orthostatic hypotension.  TIA (transient ischemic attack) last 2001   anticoagulation therapy on coumadin    Tobacco History: Social History   Tobacco Use  Smoking Status Former Smoker   Packs/day: 3.00   Years: 42.00   Pack years: 126.00   Types: Cigarettes   Start date: 08/04/1954   Quit date: 12/06/1995   Years since quitting: 23.8  Smokeless Tobacco Never Used   Counseling given: Not Answered   Outpatient Medications Prior to Visit  Medication Sig Dispense Refill   albuterol (PROVENTIL HFA;VENTOLIN HFA) 108 (90 Base) MCG/ACT inhaler Inhale 1-2 puffs into the lungs every 6 (six) hours as needed for wheezing  or shortness of breath. 3 Inhaler 4   apixaban (ELIQUIS) 5 MG TABS tablet Take 1 tablet (5 mg total) by mouth 2 (two) times daily. 180 tablet 1   azelastine (ASTELIN) 0.1 % nasal spray Place 2 sprays into the nose daily. Use in each nostril as directed 90 mL 1   buPROPion (WELLBUTRIN XL) 300 MG 24 hr tablet Take 1 tablet (300 mg total) by mouth daily. 90 tablet 3   Calcium Carbonate-Vitamin D (CALCIUM + D PO) Take 1 tablet by mouth 2 (two) times daily.      colestipol (COLESTID) 1 g tablet Take 2 tablets (2 g total) by mouth 2 (two) times daily. 360 tablet 3   cyclobenzaprine (FLEXERIL) 5 MG tablet Take 1 tablet (5 mg total) by mouth 3 (three) times daily as needed for muscle spasms. 270 tablet 1   ezetimibe (ZETIA) 10 MG tablet Take 1 tablet (10 mg total) by mouth daily. 90 tablet 3   fluticasone (FLONASE) 50 MCG/ACT nasal spray Place 1 spray into both nostrils 2 (two) times daily. 48 g 1   Fluticasone-Umeclidin-Vilant (TRELEGY ELLIPTA) 100-62.5-25 MCG/INH AEPB Inhale 1 puff into the lungs daily. I inhalation once daily 180 each 1   furosemide (LASIX) 20 MG tablet Take 1 tablet (20 mg total) by mouth daily as needed (swelling). 90 tablet 3   gabapentin (NEURONTIN) 300 MG capsule 1 tab by mouth in AM, 1 po at midday, then 2 by mouth at bedtime for sleep and pain 360 capsule 1   guaiFENesin (MUCINEX) 600 MG 12 hr tablet Take 1,200 mg by mouth 2 (two) times daily as needed for cough or to loosen phlegm.     hydroxychloroquine (PLAQUENIL) 200 MG tablet Take 400 mg by mouth at bedtime.     ipratropium (ATROVENT) 0.03 % nasal spray Place 2 sprays into both nostrils 3 (three) times daily as needed for rhinitis. 90 mL 1   levothyroxine (SYNTHROID, LEVOTHROID) 25 MCG tablet Take 1 tablet (25 mcg total) by mouth daily before breakfast. 90 tablet 3   Lidocaine HCl 2 % CREA Apply 1 application topically daily as needed (for shingles flares).     loperamide (IMODIUM) 2 MG capsule Take by mouth.      loratadine (CLARITIN) 10 MG tablet Take 10 mg by mouth daily.     Melatonin 5 MG TABS Take 5 mg by mouth at bedtime.     metoprolol succinate (TOPROL-XL) 100 MG 24 hr tablet Take 1 tablet (100 mg total) by mouth daily. Take with or immediately following a meal. 90 tablet 2   mirabegron ER (MYRBETRIQ) 50 MG TB24 tablet Take 50 mg by mouth daily.     montelukast (SINGULAIR) 10 MG tablet Take 1 tablet (10 mg total) by mouth daily. 90 tablet 3   Multiple Vitamin (MULTIVITAMIN) tablet Take 1 tablet by mouth daily.  NYSTATIN PO Take 5 mLs by mouth as needed.     omeprazole (PRILOSEC) 40 MG capsule Take 1 capsule (40 mg total) by mouth daily. 90 capsule 3   Probiotic Product (PROBIOTIC DAILY PO) Take by mouth.     traZODone (DESYREL) 50 MG tablet Take 1 tablet (50 mg total) by mouth at bedtime and may repeat dose one time if needed. 180 tablet 1   fluocinonide cream (LIDEX) [id number] % Apply 1 application topically 2 (two) times daily. 30 g 0   No facility-administered medications prior to visit.      Review of Systems:   Constitutional:   No  weight loss, night sweats,  Fevers, chills, fatigue, or  lassitude.  HEENT:   No headaches,  Difficulty swallowing,  Tooth/dental problems, or  Sore throat,                No sneezing, itching, ear ache, + nasal congestion, post nasal drip,   CV:  No chest pain,  Orthopnea, PND, swelling in lower extremities, anasarca, dizziness, palpitations, syncope.   GI  No heartburn, indigestion, abdominal pain, nausea, vomiting, diarrhea, change in bowel habits, loss of appetite, bloody stools.   Resp:  No chest wall deformity  Skin: no rash or lesions.  GU: no dysuria, change in color of urine, no urgency or frequency.  No flank pain, no hematuria   MS:  No joint pain or swelling.  No decreased range of motion.  No back pain.    Physical Exam  BP 126/76 (BP Location: Left Arm, Cuff Size: Normal)    Pulse 84    Temp 97.9 F (36.6 C)  (Temporal)    Ht 5\' 4"  (1.626 m)    Wt 138 lb 6.4 oz (62.8 kg)    SpO2 97%    BMI 23.76 kg/m   GEN: A/Ox3; pleasant , NAD, elderly    HEENT:  [place]/AT,   NOSE-clear, THROAT-clear, no lesions, no postnasal drip or exudate noted.   NECK:  Supple w/ fair ROM; no JVD; normal carotid impulses w/o bruits; no thyromegaly or nodules palpated; no lymphadenopathy.    RESP decreased breath sounds in the bases  no accessory muscle use, no dullness to percussion  CARD:  RRR, grade 1/6 systolic murmur no  peripheral edema, pulses intact, no cyanosis or clubbing.  GI:   Soft & nt; nml bowel sounds; no organomegaly or masses detected.   Musco: Warm bil, no deformities or joint swelling noted.   Neuro: alert, no focal deficits noted.    Skin: Warm, no lesions or rashes    Lab Results:  CBC    Imaging: Dg Chest 2 View  Result Date: 09/23/2019 CLINICAL DATA:  COPD follow-up. EXAM: CHEST - 2 VIEW COMPARISON:  Chest x-ray dated December 20, 2017. FINDINGS: The heart size and mediastinal contours are within normal limits. Atherosclerotic calcification of the aortic arch. Normal pulmonary vascularity. The lungs remain hyperinflated with emphysematous changes. No focal consolidation, pleural effusion, or pneumothorax. Unchanged small to moderate hiatal hernia. No acute osseous abnormality. IMPRESSION: 1.  No active cardiopulmonary disease. 2. COPD. Electronically Signed   By: Titus Dubin M.D.   On: 09/23/2019 12:19      PFT Results Latest Ref Rng & Units 10/10/2016 03/01/2016  FVC-Pre L 1.94 2.55  FVC-Predicted Pre % 70 91  FVC-Post L 2.17 2.72  FVC-Predicted Post % 78 97  Pre FEV1/FVC % % 48 51  Post FEV1/FCV % % 51 53  FEV1-Pre L 0.93  1.30  FEV1-Predicted Pre % 45 62  FEV1-Post L 1.11 1.44  DLCO UNC% % - 65  DLCO COR %Predicted % - 71  TLC L - 5.66  TLC % Predicted % - 111  RV % Predicted % - 116    No results found for: NITRICOXIDE      Assessment & Plan:   COPD (chronic  obstructive pulmonary disease) (HCC) Severe COPD currently stable. Chest x-ray today  Plan  Patient Instructions  Severe COPD:  Continue Trelegy 1 puff daily Rinse her mouth well after using Trelegy Practice good hand hygiene, social distancing and mask are key .  Stay active as able.  Chest xray today   Gastroesophageal reflux disease: Continue taking omeprazole daily in am .  Continue taking famotidine in evening .   Chronic Rhinitis: Continue taking ipratropium nose spray as directed Continue loratadine 10 mg daily Continue Astelin nose spray daily  Continue fluticasone nose spray daily  Continue on Singulair daily.   We will see you back in 6 months with Dr. Valeta Harms or Tomeko Scoville NP and As needed       Allergic rhinitis Continue on current regimen appears to be controlled  Plan  Patient Instructions  Severe COPD:  Continue Trelegy 1 puff daily Rinse her mouth well after using Trelegy Practice good hand hygiene, social distancing and mask are key .  Stay active as able.  Chest xray today   Gastroesophageal reflux disease: Continue taking omeprazole daily in am .  Continue taking famotidine in evening .   Chronic Rhinitis: Continue taking ipratropium nose spray as directed Continue loratadine 10 mg daily Continue Astelin nose spray daily  Continue fluticasone nose spray daily  Continue on Singulair daily.   We will see you back in 6 months with Dr. Valeta Harms or Nikyla Navedo NP and As needed       GERD Controlled on current regimen Continue on GERD diet  Chronic combined systolic and diastolic CHF (congestive heart failure) ([place]) Appears compensated without overt overload on exam  Cont follow up with cardiology      Rexene Edison, NP 09/23/2019

## 2019-09-23 NOTE — Assessment & Plan Note (Signed)
Severe COPD currently stable. Chest x-ray today  Plan  Patient Instructions  Severe COPD:  Continue Trelegy 1 puff daily Rinse her mouth well after using Trelegy Practice good hand hygiene, social distancing and mask are key .  Stay active as able.  Chest xray today   Gastroesophageal reflux disease: Continue taking omeprazole daily in am .  Continue taking famotidine in evening .   Chronic Rhinitis: Continue taking ipratropium nose spray as directed Continue loratadine 10 mg daily Continue Astelin nose spray daily  Continue fluticasone nose spray daily  Continue on Singulair daily.   We will see you back in 6 months with Dr. Valeta Harms or Stella Bortle NP and As needed

## 2019-09-23 NOTE — Progress Notes (Signed)
PCCM: Agree. Thanks Leory Plowman

## 2019-09-24 NOTE — Progress Notes (Signed)
Called spoke with patient, advised of cxr results / recs as stated by Parrett NP.  Pt verbalized understanding and denied any questions. 

## 2019-09-26 MED ORDER — ALBUTEROL SULFATE HFA 108 (90 BASE) MCG/ACT IN AERS: 1.0000 | INHALATION_SPRAY | Freq: Four times a day (QID) | RESPIRATORY_TRACT | 6 refills | Status: DC | PRN

## 2019-10-10 ENCOUNTER — Ambulatory Visit (INDEPENDENT_AMBULATORY_CARE_PROVIDER_SITE_OTHER): Payer: Medicare Other

## 2019-10-10 ENCOUNTER — Ambulatory Visit (INDEPENDENT_AMBULATORY_CARE_PROVIDER_SITE_OTHER): Payer: Medicare Other | Admitting: Sports Medicine

## 2019-10-10 ENCOUNTER — Other Ambulatory Visit: Payer: Self-pay

## 2019-10-10 ENCOUNTER — Other Ambulatory Visit: Payer: Self-pay | Admitting: Sports Medicine

## 2019-10-10 ENCOUNTER — Encounter: Payer: Self-pay | Admitting: Sports Medicine

## 2019-10-10 ENCOUNTER — Ambulatory Visit: Payer: Medicare Other | Admitting: Sports Medicine

## 2019-10-10 DIAGNOSIS — M2041 Other hammer toe(s) (acquired), right foot: Secondary | ICD-10-CM

## 2019-10-10 DIAGNOSIS — M2061 Acquired deformities of toe(s), unspecified, right foot: Secondary | ICD-10-CM | POA: Diagnosis not present

## 2019-10-10 DIAGNOSIS — M2042 Other hammer toe(s) (acquired), left foot: Secondary | ICD-10-CM

## 2019-10-10 DIAGNOSIS — M21619 Bunion of unspecified foot: Secondary | ICD-10-CM

## 2019-10-10 DIAGNOSIS — M2040 Other hammer toe(s) (acquired), unspecified foot: Secondary | ICD-10-CM

## 2019-10-10 DIAGNOSIS — M19071 Primary osteoarthritis, right ankle and foot: Secondary | ICD-10-CM

## 2019-10-10 NOTE — Progress Notes (Signed)
Subjective: [name] is a 78 y.o. female patient who presents to office for evaluation of Right toe changes.  Patient reports that there is no specific pain but she is worried about the toes shifting and drifting reports that her second toe rubs against the side of the big toe and sometimes she gets swollen knots on her toes.  Patient has history of rheumatoid arthritis currently on Eliquis for history of A. fib.  Patient denies any acute trauma or injury and reports that her toes have started to change like this over the last 5 years.  Patient has not tried any specific things besides spacers for treatment.  No other pedal complaints noted.  Review of Systems  All other systems reviewed and are negative.   Patient Active Problem List   Diagnosis Date Noted  . Erosive osteoarthritis of both hands 10/17/2018  . Arthritis of finger 04/26/2018  . Arthritis of midtarsal joint of right foot 04/16/2018  . TIA (transient ischemic attack)   . Status post dilation of esophageal narrowing   . Shingles   . PVC's (premature ventricular contractions)   . Orthostatic hypotension   . NICM (nonischemic cardiomyopathy) ([place])   . Nephrolithiasis   . Mild CAD   . HTN (hypertension)   . Ischemic colon ([place])   . History of kidney stones   . Hiatal hernia   . Esophageal stricture   . Cataract   . Chronic combined systolic and diastolic CHF (congestive heart failure) ([place])   . Bradycardia   . Atrial septal aneurysm   . Anxiety   . Allergic rhinitis   . Weakness of right hip 11/16/2017  . Stress fracture of ankle, initial encounter 11/16/2017  . CKD (chronic kidney disease) stage 3, GFR 30-59 ml/min 10/13/2017  . Hoarseness 09/04/2017  . Posterior tibial tendon dysfunction 08/28/2017  . Drug hypersensitivity 06/22/2017  . Lobar pneumonia, unspecified organism ([place]) 06/02/2017  . PHN (postherpetic neuralgia) 04/16/2017  . Abnormal TSH 04/12/2017  . 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[place]) 10/20/2016  .  Chronic atrial fibrillation ([place]) 09/17/2016  . Postoperative examination 09/14/2016  . Acute on chronic combined systolic (congestive) and diastolic (congestive) heart failure 08/17/2016  . Syncope 08/10/2016  . PAF (paroxysmal atrial fibrillation) ([place]) 08/10/2016  . COPD (chronic obstructive pulmonary disease) ([place]) 08/10/2016  . Chronic pain syndrome   . Atrial fibrillation with rapid ventricular response ([place]) 07/27/2016  . HLD (hyperlipidemia) 07/27/2016  . Insomnia 07/27/2016  . Depression with anxiety 07/27/2016  . Persistent atrial fibrillation ([place]) 07/27/2016  . Gluteus medius tear 03/15/2016  . Stricture and stenosis of esophagus 03/03/2014  . Dysphagia 09/30/2013  . Lung nodule 08/01/2013  . Special screening for malignant neoplasms, colon 03/20/2013  . Sleep related leg cramps 10/01/2011  . Basal cell carcinoma   . Osteoarthritis   . History of CVA (cerebrovascular accident)   . DI (detrusor instability)   . Preventative health care 08/07/2011  . RECTOCELE WITHOUT MENTION OF UTERINE PROLAPSE 10/04/2007  . BLADDER SUSPENSION, HX OF 10/04/2007  . HEMORRHOIDS 08/11/2007  . Gold B Copd with acute bronchitis 08/11/2007  . GERD 08/11/2007  . FIBROCYSTIC BREAST DISEASE, HX OF 08/11/2007  . Stroke [facility]) 12/06/1999    Current Outpatient Medications on File Prior to Visit  Medication Sig Dispense Refill  . albuterol (VENTOLIN HFA) 108 (90 Base) MCG/ACT inhaler Inhale 1-2 puffs into the lungs every 6 (six) hours as needed for wheezing or shortness of breath. 8 g 6  . apixaban (ELIQUIS) 5  MG TABS tablet Take 1 tablet (5 mg total) by mouth 2 (two) times daily. 180 tablet 1  . azelastine (ASTELIN) 0.1 % nasal spray Place 2 sprays into the nose daily. Use in each nostril as directed 90 mL 1  . buPROPion (WELLBUTRIN XL) 300 MG 24 hr tablet Take 1 tablet (300 mg total) by mouth daily. 90 tablet 3  . Calcium Carbonate-Vitamin D (CALCIUM + D PO) Take 1 tablet by mouth 2 (two) times  daily.     . colestipol (COLESTID) 1 g tablet Take 2 tablets (2 g total) by mouth 2 (two) times daily. 360 tablet 3  . cyclobenzaprine (FLEXERIL) 5 MG tablet Take 1 tablet (5 mg total) by mouth 3 (three) times daily as needed for muscle spasms. 270 tablet 1  . ezetimibe (ZETIA) 10 MG tablet Take 1 tablet (10 mg total) by mouth daily. 90 tablet 3  . fluticasone (FLONASE) 50 MCG/ACT nasal spray Place 1 spray into both nostrils 2 (two) times daily. 48 g 1  . Fluticasone-Umeclidin-Vilant (TRELEGY ELLIPTA) 100-62.5-25 MCG/INH AEPB Inhale 1 puff into the lungs daily. I inhalation once daily 180 each 1  . furosemide (LASIX) 20 MG tablet Take 1 tablet (20 mg total) by mouth daily as needed (swelling). 90 tablet 3  . gabapentin (NEURONTIN) 300 MG capsule 1 tab by mouth in AM, 1 po at midday, then 2 by mouth at bedtime for sleep and pain 360 capsule 1  . guaiFENesin (MUCINEX) 600 MG 12 hr tablet Take 1,200 mg by mouth 2 (two) times daily as needed for cough or to loosen phlegm.    . hydroxychloroquine (PLAQUENIL) 200 MG tablet Take 400 mg by mouth at bedtime.    Marland Kitchen ipratropium (ATROVENT) 0.03 % nasal spray Place 2 sprays into both nostrils 3 (three) times daily as needed for rhinitis. 90 mL 1  . levothyroxine (SYNTHROID, LEVOTHROID) 25 MCG tablet Take 1 tablet (25 mcg total) by mouth daily before breakfast. 90 tablet 3  . Lidocaine HCl 2 % CREA Apply 1 application topically daily as needed (for shingles flares).    Marland Kitchen loperamide (IMODIUM) 2 MG capsule Take by mouth.    . loratadine (CLARITIN) 10 MG tablet Take 10 mg by mouth daily.    . Melatonin 5 MG TABS Take 5 mg by mouth at bedtime.    . metoprolol succinate (TOPROL-XL) 100 MG 24 hr tablet Take 1 tablet (100 mg total) by mouth daily. Take with or immediately following a meal. 90 tablet 2  . mirabegron ER (MYRBETRIQ) 50 MG TB24 tablet Take 50 mg by mouth daily.    . montelukast (SINGULAIR) 10 MG tablet Take 1 tablet (10 mg total) by mouth daily. 90 tablet 3   . Multiple Vitamin (MULTIVITAMIN) tablet Take 1 tablet by mouth daily.      . NYSTATIN PO Take 5 mLs by mouth as needed.    Marland Kitchen omeprazole (PRILOSEC) 40 MG capsule Take 1 capsule (40 mg total) by mouth daily. 90 capsule 3  . Probiotic Product (PROBIOTIC DAILY PO) Take by mouth.    . traZODone (DESYREL) 50 MG tablet Take 1 tablet (50 mg total) by mouth at bedtime and may repeat dose one time if needed. 180 tablet 1   No current facility-administered medications on file prior to visit.     Allergies  Allergen Reactions  . Clarithromycin Other (See Comments)    REACTION: possible rash but could have been legionarres dz with rash  . Lipitor [Atorvastatin] Other (See  Comments)    MUSCLE SPASMS  . Simvastatin Other (See Comments)    Muscle and joint pain  . Cardizem [Diltiazem Hcl] Hives  . Contrast Media [Iodinated Diagnostic Agents]     FYI - during admission at [facility] 08/2016 patient developed hives, question related to cardizem or contrast dye - not clear  . Crestor [Rosuvastatin Calcium]     fagitue and joint pain, "NO STATINS"  . Rosuvastatin Calcium Other (See Comments)    fagitue and joint pain, "NO STATINS"    Objective:  General: Alert and oriented x3 in no acute distress  Dermatology: No hyperkeratotic lesions noted bilateral, no webspace macerations, no ecchymosis bilateral, all nails x 10 are well manicured.  Vascular: Dorsalis Pedis and Posterior Tibial pedal pulses 1/4, Capillary Fill Time 5 seconds,(-) pedal hair growth bilateral, no edema bilateral lower extremities, Temperature gradient within normal limits.  Neurology: Johney Maine sensation intact via light touch bilateral.  Musculoskeletal: Semi-flexible hammertoes 2-5 with hallux casting right greater than left and tight extensor tendon present.  There is significant digital deformity secondary to history of rheumatoid arthritis.  Strength within normal limits for patient status.   Xrays  Right foot     Impression: Digital deformity with subluxation of lesser toes and arthritis diffuse.       Assessment and Plan: Problem List Items Addressed This Visit    None    Visit Diagnoses    Hammer toe, unspecified laterality    -  Primary   Bunion       Acquired deformity of right toe       Arthritis of foot, right           -Complete examination performed -Xrays reviewed -Discussed treatement options for arthritis and toe deformity -Dispense toe spacers and toe alignment splint and instructed patient on proper use -Advised patient for surgical reconstruction it will be a BIG recovery patient at this time is not interested in surgery. -Patient to return to office after 4 to 6 weeks if needed  or sooner if condition worsens.  Landis Martins, DPM

## 2019-10-29 ENCOUNTER — Encounter: Payer: Self-pay | Admitting: Internal Medicine

## 2019-10-29 ENCOUNTER — Other Ambulatory Visit (INDEPENDENT_AMBULATORY_CARE_PROVIDER_SITE_OTHER): Payer: Medicare Other

## 2019-10-29 ENCOUNTER — Other Ambulatory Visit: Payer: Self-pay

## 2019-10-29 ENCOUNTER — Ambulatory Visit (INDEPENDENT_AMBULATORY_CARE_PROVIDER_SITE_OTHER): Payer: Medicare Other | Admitting: Internal Medicine

## 2019-10-29 VITALS — BP 136/82 | HR 57 | Temp 98.1°F | Ht 64.0 in | Wt 138.0 lb

## 2019-10-29 DIAGNOSIS — F419 Anxiety disorder, unspecified: Secondary | ICD-10-CM | POA: Diagnosis not present

## 2019-10-29 DIAGNOSIS — E538 Deficiency of other specified B group vitamins: Secondary | ICD-10-CM

## 2019-10-29 DIAGNOSIS — E559 Vitamin D deficiency, unspecified: Secondary | ICD-10-CM | POA: Diagnosis not present

## 2019-10-29 DIAGNOSIS — E611 Iron deficiency: Secondary | ICD-10-CM

## 2019-10-29 DIAGNOSIS — E7800 Pure hypercholesterolemia, unspecified: Secondary | ICD-10-CM

## 2019-10-29 DIAGNOSIS — I10 Essential (primary) hypertension: Secondary | ICD-10-CM | POA: Diagnosis not present

## 2019-10-29 DIAGNOSIS — N183 Chronic kidney disease, stage 3 unspecified: Secondary | ICD-10-CM | POA: Diagnosis not present

## 2019-10-29 DIAGNOSIS — F418 Other specified anxiety disorders: Secondary | ICD-10-CM | POA: Diagnosis not present

## 2019-10-29 DIAGNOSIS — R739 Hyperglycemia, unspecified: Secondary | ICD-10-CM

## 2019-10-29 LAB — URINALYSIS, ROUTINE W REFLEX MICROSCOPIC
Bilirubin Urine: NEGATIVE
Hgb urine dipstick: NEGATIVE
Ketones, ur: NEGATIVE
Leukocytes,Ua: NEGATIVE
Nitrite: NEGATIVE
RBC / HPF: NONE SEEN (ref 0–?)
Specific Gravity, Urine: <=1.005 — AB (ref 1.000–1.030)
Total Protein, Urine: NEGATIVE
Urine Glucose: NEGATIVE
Urobilinogen, UA: 0.2 (ref 0.0–1.0)
WBC, UA: NONE SEEN (ref 0–?)
pH: 6 (ref 5.0–8.0)

## 2019-10-29 LAB — HEPATIC FUNCTION PANEL
ALT: 13 U/L (ref 0–35)
AST: 20 U/L (ref 0–37)
Albumin: 3.8 g/dL (ref 3.5–5.2)
Alkaline Phosphatase: 99 U/L (ref 39–117)
Bilirubin, Direct: 0.1 mg/dL (ref 0.0–0.3)
Total Bilirubin: 0.5 mg/dL (ref 0.2–1.2)
Total Protein: 6.6 g/dL (ref 6.0–8.3)

## 2019-10-29 LAB — BASIC METABOLIC PANEL
BUN: 19 mg/dL (ref 6–23)
CO2: 29 mEq/L (ref 19–32)
Calcium: 9.2 mg/dL (ref 8.4–10.5)
Chloride: 100 mEq/L (ref 96–112)
Creatinine, Ser: 1.13 mg/dL (ref 0.40–1.20)
GFR: 46.3 mL/min — ABNORMAL LOW (ref >60.00)
Glucose, Bld: 80 mg/dL (ref 70–99)
Potassium: 4 mEq/L (ref 3.5–5.1)
Sodium: 136 mEq/L (ref 135–145)

## 2019-10-29 LAB — CBC WITH DIFFERENTIAL/PLATELET
Basophils Absolute: 0 10*3/uL (ref 0.0–0.1)
Basophils Relative: 0.8 % (ref 0.0–3.0)
Eosinophils Absolute: 0.1 10*3/uL (ref 0.0–0.7)
Eosinophils Relative: 1.3 % (ref 0.0–5.0)
HCT: 39.1 % (ref 36.0–46.0)
Hemoglobin: 12.6 g/dL (ref 12.0–15.0)
Lymphocytes Relative: 24.4 % (ref 12.0–46.0)
Lymphs Abs: 1.6 10*3/uL (ref 0.7–4.0)
MCHC: 32.3 g/dL (ref 30.0–36.0)
MCV: 80.1 fl (ref 78.0–100.0)
Monocytes Absolute: 0.9 10*3/uL (ref 0.1–1.0)
Monocytes Relative: 14.3 % — ABNORMAL HIGH (ref 3.0–12.0)
Neutro Abs: 3.9 10*3/uL (ref 1.4–7.7)
Neutrophils Relative %: 59.2 % (ref 43.0–77.0)
Platelets: 186 10*3/uL (ref 150.0–400.0)
RBC: 4.87 Mil/uL (ref 3.87–5.11)
RDW: 15.9 % — ABNORMAL HIGH (ref 11.5–15.5)
WBC: 6.5 10*3/uL (ref 4.0–10.5)

## 2019-10-29 LAB — LIPID PANEL
Cholesterol: 161 mg/dL (ref 0–200)
HDL: 55.7 mg/dL (ref >39.00)
LDL Cholesterol: 70 mg/dL (ref 0–99)
NonHDL: 105.07
Total CHOL/HDL Ratio: 3
Triglycerides: 175 mg/dL — ABNORMAL HIGH (ref 0.0–149.0)
VLDL: 35 mg/dL (ref 0.0–40.0)

## 2019-10-29 LAB — VITAMIN D 25 HYDROXY (VIT D DEFICIENCY, FRACTURES): VITD: 55.97 ng/mL (ref 30.00–100.00)

## 2019-10-29 LAB — IBC PANEL
Iron: 59 ug/dL (ref 42–145)
Saturation Ratios: 13.9 % — ABNORMAL LOW (ref 20.0–50.0)
Transferrin: 304 mg/dL (ref 212.0–360.0)

## 2019-10-29 LAB — VITAMIN B12: Vitamin B-12: 227 pg/mL (ref 211–911)

## 2019-10-29 LAB — HEMOGLOBIN A1C: Hgb A1c MFr Bld: 5.7 % (ref 4.6–6.5)

## 2019-10-29 LAB — TSH: TSH: 2.34 u[IU]/mL (ref 0.35–4.50)

## 2019-10-29 MED ORDER — EZETIMIBE 10 MG PO TABS: 10.0000 mg | ORAL_TABLET | Freq: Every day | ORAL | 3 refills | Status: DC

## 2019-10-29 MED ORDER — BUPROPION HCL ER (XL) 300 MG PO TB24: 300.0000 mg | ORAL_TABLET | Freq: Every day | ORAL | 3 refills | Status: DC

## 2019-10-29 NOTE — Patient Instructions (Signed)

## 2019-10-29 NOTE — Progress Notes (Signed)
Subjective:    Patient ID: [name], female    DOB: 1941-10-28, 78 y.o.   MRN: [id number]  HPI  Here to f/u; overall doing ok,  Pt denies chest pain, increasing sob or doe, wheezing, orthopnea, PND, increased LE swelling, palpitations, dizziness or syncope.  Pt denies new neurological symptoms such as new headache, or facial or extremity weakness or numbness.  Pt denies polydipsia, polyuria, or low sugar episode.  Pt states overall good compliance with meds, mostly trying to follow appropriate diet, with wt overall stable,  but little exercise however.  Husband with PTSD, non violent but is stressfull for her as well, and now having worsening mild depressive symptoms, but no suicidal ideation, or panic. Past Medical History:  Diagnosis Date  . Allergic rhinitis   . Anxiety   . Atrial septal aneurysm   . Basal cell carcinoma   . Bradycardia   . Cataract    bil cataracts removed  . Chronic combined systolic and diastolic CHF (congestive heart failure) ([place])   . CKD (chronic kidney disease), stage III   . Clostridium difficile infection   . COPD (chronic obstructive pulmonary disease) ([place])   . Depression 11/29/2013  . DI (detrusor instability)   . Esophageal stricture   . GERD (gastroesophageal reflux disease)   . Hemorrhoids   . Hiatal hernia   . History of kidney stones   . HLD (hyperlipidemia)   . HTN (hypertension)   . Ischemic colon ([place])    a. 08/2016 - adm to  [facility] with a ruptured colon s/p right hemicolectomy with ileostomy formation for ischemic and necrotic colon.   . Lung nodule    a. followed by pulmonology  . Mild CAD    a. minor nonobstructive CAD 08/22/16.  . Nephrolithiasis    hx  . NICM (nonischemic cardiomyopathy) ([place])   . Orthostatic hypotension   . Osteoarthritis   . PAF (paroxysmal atrial fibrillation) ([place])    a. diagnosed 07/2016 s/p DCCV 08/01/16 with recurrence, managed with rate control for now.   . Pneumonia 2013  . PVC's (premature  ventricular contractions)   . Shingles since Nov 18, 2013   on right arm from shoulder to wrist  . Status post dilation of esophageal narrowing   . Stroke [facility]) 2001   STROKES, TIA'S  . Syncope    a. 08/2016 felt due to orthostatic hypotension.  Marland Kitchen TIA (transient ischemic attack) last 2001   anticoagulation therapy on coumadin   Past Surgical History:  Procedure Laterality Date  . ANTERIOR LATERAL LUMBAR FUSION 4 LEVELS Right 07/03/2015   Procedure: Right Lumbar one-two, Lumbar two-three, Lumbar three-four, Lumbar four-five Anterior lateral lumbar interbody fusion;  Surgeon: Erline Levine, MD;  Location: [facility] NEURO ORS;  Service: Neurosurgery;  Laterality: Right;  Right L1-2 L2-3 L3-4 L4-5 Anterior lateral lumbar interbody fusion  . BALLOON DILATION N/A 03/03/2014   Procedure: BALLOON DILATION;  Surgeon: Inda Castle, MD;  Location: [facility] ENDOSCOPY;  Service: Endoscopy;  Laterality: N/A;  . BLADDER SUSPENSION    . BREAST BIOPSY    . CARDIAC CATHETERIZATION N/A 08/22/2016   Procedure: Left Heart Cath and Coronary Angiography;  Surgeon: Peter M Martinique, MD;  Location: [place] CV LAB;  Service: Cardiovascular;  Laterality: N/A;  . CARDIOVERSION N/A 08/01/2016   Procedure: CARDIOVERSION;  Surgeon: Jerline Pain, MD;  Location: [facility] ENDOSCOPY;  Service: Cardiovascular;  Laterality: N/A;  . CARDIOVERSION N/A 10/11/2016   Procedure: CARDIOVERSION;  Surgeon: Sueanne Margarita,  MD;  Location: [facility] ENDOSCOPY;  Service: Cardiovascular;  Laterality: N/A;  . COLONOSCOPY    . ESOPHAGOGASTRODUODENOSCOPY N/A 03/03/2014   Procedure: ESOPHAGOGASTRODUODENOSCOPY (EGD);  Surgeon: Inda Castle, MD;  Location: [facility];  Service: Endoscopy;  Laterality: N/A;  . EXCISION OF BASAL CELL CA  2011  . FINGER SURGERY    . GLUTEUS MINIMUS REPAIR Right 03/15/2016   Procedure: RIGHT HIP GLUTEUS MEDIUS TENDON REPAIR;  Surgeon: Paralee Cancel, MD;  Location: [facility] ORS;  Service: Orthopedics;  Laterality: Right;  . ileostomy reversal     . LUMBAR PERCUTANEOUS PEDICLE SCREW 4 LEVEL Right 07/03/2015   Procedure: Lumbar one-Sacral one Bilateral percutaneous pedicle screws;  Surgeon: Erline Levine, MD;  Location: [place] NEURO ORS;  Service: Neurosurgery;  Laterality: Right;  L1-S1 Bilateral percutaneous pedicle screws  . RECTOCELE REPAIR  2008   A&P REPAIR with vaginal repair-DR. MCDIARMID  . THUMB SURGERY Right 10-15 yrs ago  . UPPER GASTROINTESTINAL ENDOSCOPY    . VAGINAL HYSTERECTOMY  1983   NO BSO-DR. MABRY  . VARICOSE VEIN SURGERY      reports that she quit smoking about 23 years ago. Her smoking use included cigarettes. She started smoking about 65 years ago. She has a 126.00 pack-year smoking history. She has never used smokeless tobacco. She reports that she does not drink alcohol or use drugs. family history includes Breast cancer in her maternal aunt, maternal aunt, maternal aunt, and mother; COPD in her mother; Emphysema in her mother; Uterine cancer in her mother. Allergies  Allergen Reactions  . Clarithromycin Other (See Comments)    REACTION: possible rash but could have been legionarres dz with rash  . Lipitor [Atorvastatin] Other (See Comments)    MUSCLE SPASMS  . Simvastatin Other (See Comments)    Muscle and joint pain  . Cardizem [Diltiazem Hcl] Hives  . Contrast Media [Iodinated Diagnostic Agents]     FYI - during admission at [facility] 08/2016 patient developed hives, question related to cardizem or contrast dye - not clear  . Crestor [Rosuvastatin Calcium]     fagitue and joint pain, "NO STATINS"  . Rosuvastatin Calcium Other (See Comments)    fagitue and joint pain, "NO STATINS"   Current Outpatient Medications on File Prior to Visit  Medication Sig Dispense Refill  . albuterol (VENTOLIN HFA) 108 (90 Base) MCG/ACT inhaler Inhale 1-2 puffs into the lungs every 6 (six) hours as needed for wheezing or shortness of breath. 8 g 6  . apixaban (ELIQUIS) 5 MG TABS tablet Take 1 tablet (5 mg total) by  mouth 2 (two) times daily. 180 tablet 1  . azelastine (ASTELIN) 0.1 % nasal spray Place 2 sprays into the nose daily. Use in each nostril as directed 90 mL 1  . Calcium Carbonate-Vitamin D (CALCIUM + D PO) Take 1 tablet by mouth 2 (two) times daily.     . colestipol (COLESTID) 1 g tablet Take 2 tablets (2 g total) by mouth 2 (two) times daily. 360 tablet 3  . cyclobenzaprine (FLEXERIL) 5 MG tablet Take 1 tablet (5 mg total) by mouth 3 (three) times daily as needed for muscle spasms. 270 tablet 1  . fluticasone (FLONASE) 50 MCG/ACT nasal spray Place 1 spray into both nostrils 2 (two) times daily. 48 g 1  . Fluticasone-Umeclidin-Vilant (TRELEGY ELLIPTA) 100-62.5-25 MCG/INH AEPB Inhale 1 puff into the lungs daily. I inhalation once daily 180 each 1  . furosemide (LASIX) 20 MG tablet Take 1 tablet (20 mg total) by mouth  daily as needed (swelling). 90 tablet 3  . gabapentin (NEURONTIN) 300 MG capsule 1 tab by mouth in AM, 1 po at midday, then 2 by mouth at bedtime for sleep and pain 360 capsule 1  . guaiFENesin (MUCINEX) 600 MG 12 hr tablet Take 1,200 mg by mouth 2 (two) times daily as needed for cough or to loosen phlegm.    . hydroxychloroquine (PLAQUENIL) 200 MG tablet Take 400 mg by mouth at bedtime.    Marland Kitchen ipratropium (ATROVENT) 0.03 % nasal spray Place 2 sprays into both nostrils 3 (three) times daily as needed for rhinitis. 90 mL 1  . levothyroxine (SYNTHROID, LEVOTHROID) 25 MCG tablet Take 1 tablet (25 mcg total) by mouth daily before breakfast. 90 tablet 3  . Lidocaine HCl 2 % CREA Apply 1 application topically daily as needed (for shingles flares).    Marland Kitchen loperamide (IMODIUM) 2 MG capsule Take by mouth.    . loratadine (CLARITIN) 10 MG tablet Take 10 mg by mouth daily.    . Melatonin 5 MG TABS Take 5 mg by mouth at bedtime.    . metoprolol succinate (TOPROL-XL) 100 MG 24 hr tablet Take 1 tablet (100 mg total) by mouth daily. Take with or immediately following a meal. 90 tablet 2  . mirabegron ER  (MYRBETRIQ) 50 MG TB24 tablet Take 50 mg by mouth daily.    . montelukast (SINGULAIR) 10 MG tablet Take 1 tablet (10 mg total) by mouth daily. 90 tablet 3  . Multiple Vitamin (MULTIVITAMIN) tablet Take 1 tablet by mouth daily.      . NYSTATIN PO Take 5 mLs by mouth as needed.    Marland Kitchen omeprazole (PRILOSEC) 40 MG capsule Take 1 capsule (40 mg total) by mouth daily. 90 capsule 3  . Probiotic Product (PROBIOTIC DAILY PO) Take by mouth.    . traZODone (DESYREL) 50 MG tablet Take 1 tablet (50 mg total) by mouth at bedtime and may repeat dose one time if needed. 180 tablet 1   No current facility-administered medications on file prior to visit.    Review of Systems  Constitutional: Negative for other unusual diaphoresis or sweats HENT: Negative for ear discharge or swelling Eyes: Negative for other worsening visual disturbances Respiratory: Negative for stridor or other swelling  Gastrointestinal: Negative for worsening distension or other blood Genitourinary: Negative for retention or other urinary change Musculoskeletal: Negative for other MSK pain or swelling Skin: Negative for color change or other new lesions Neurological: Negative for worsening tremors and other numbness  Psychiatric/Behavioral: Negative for worsening agitation or other fatigue All otherwise neg per pt    Objective:   Physical Exam BP 136/82   Pulse (!) 57   Temp 98.1 F (36.7 C) (Oral)   Ht 5\' 4"  (1.626 m)   Wt 138 lb (62.6 kg)   SpO2 92%   BMI 23.69 kg/m  VS noted,  Constitutional: Pt appears in NAD HENT: Head: NCAT.  Right Ear: External ear normal.  Left Ear: External ear normal.  Eyes: . Pupils are equal, round, and reactive to light. Conjunctivae and EOM are normal Nose: without d/c or deformity Neck: Neck supple. Gross normal ROM Cardiovascular: Normal rate and regular rhythm.   Pulmonary/Chest: Effort normal and breath sounds without rales or wheezing.  Abd:  Soft, NT, ND, + BS, no organomegaly  Neurological: Pt is alert. At baseline orientation, motor grossly intact Skin: Skin is warm. No rashes, other new lesions, no LE edema Psychiatric: Pt behavior is normal without  agitation  All otherwise neg per pt  Lab Results  Component Value Date   WBC 6.5 10/29/2019   HGB 12.6 10/29/2019   HCT 39.1 10/29/2019   PLT 186.0 10/29/2019   GLUCOSE 80 10/29/2019   CHOL 161 10/29/2019   TRIG 175.0 (H) 10/29/2019   HDL 55.70 10/29/2019   LDLDIRECT 133.0 10/17/2018   LDLCALC 70 10/29/2019   ALT 13 10/29/2019   AST 20 10/29/2019   NA 136 10/29/2019   K 4.0 10/29/2019   CL 100 10/29/2019   CREATININE 1.13 10/29/2019   BUN 19 10/29/2019   CO2 29 10/29/2019   TSH 2.34 10/29/2019   INR 1.21 08/17/2016   HGBA1C 5.7 10/29/2019        Assessment & Plan:

## 2019-11-02 ENCOUNTER — Encounter: Payer: Self-pay | Admitting: Internal Medicine

## 2019-11-02 NOTE — Assessment & Plan Note (Signed)
stable overall by history and exam, recent data reviewed with pt, and pt to continue medical treatment as before,  to f/u any worsening symptoms or concerns  

## 2019-11-02 NOTE — Assessment & Plan Note (Signed)
Mild worsening, no SI or HI, declines tx or referral for counseling  Note:  Total time for pt hx, exam, review of record with pt in the room, determination of diagnoses and plan for further eval and tx is > 40 min, with over 50% spent in coordination and counseling of patient including the differential dx, tx, further evaluation and other management of depression, anxiety, HTN, HLD, CKD

## 2019-12-04 DIAGNOSIS — M154 Erosive (osteo)arthritis: Secondary | ICD-10-CM | POA: Diagnosis not present

## 2019-12-04 DIAGNOSIS — Z79899 Other long term (current) drug therapy: Secondary | ICD-10-CM | POA: Diagnosis not present

## 2019-12-05 ENCOUNTER — Other Ambulatory Visit: Payer: Self-pay

## 2019-12-05 ENCOUNTER — Encounter: Payer: Self-pay | Admitting: Internal Medicine

## 2019-12-05 MED ORDER — COLESTIPOL HCL 1 G PO TABS: 2.0000 g | ORAL_TABLET | Freq: Two times a day (BID) | ORAL | 3 refills | Status: DC

## 2019-12-09 ENCOUNTER — Other Ambulatory Visit: Payer: Self-pay | Admitting: General Surgery

## 2019-12-09 DIAGNOSIS — J449 Chronic obstructive pulmonary disease, unspecified: Secondary | ICD-10-CM

## 2019-12-09 DIAGNOSIS — J438 Other emphysema: Secondary | ICD-10-CM

## 2019-12-09 MED ORDER — LEVOTHYROXINE SODIUM 25 MCG PO TABS: 25.0000 ug | ORAL_TABLET | Freq: Every day | ORAL | 2 refills | Status: DC

## 2019-12-09 MED ORDER — TRELEGY ELLIPTA 100-62.5-25 MCG/INH IN AEPB: 1.0000 | INHALATION_SPRAY | Freq: Every day | RESPIRATORY_TRACT | 1 refills | Status: DC

## 2019-12-09 NOTE — Progress Notes (Signed)
Prescription for Trelegy sent to mail order at the request of the patient. Nothing further needed.

## 2019-12-11 MED ORDER — IPRATROPIUM BROMIDE 0.03 % NA SOLN: 2.0000 | Freq: Three times a day (TID) | NASAL | 1 refills | Status: DC | PRN

## 2019-12-12 ENCOUNTER — Encounter: Payer: Self-pay | Admitting: Internal Medicine

## 2019-12-12 MED ORDER — APIXABAN 5 MG PO TABS: 5.0000 mg | ORAL_TABLET | Freq: Two times a day (BID) | ORAL | 3 refills | Status: DC

## 2019-12-26 ENCOUNTER — Encounter: Payer: Self-pay | Admitting: Internal Medicine

## 2019-12-26 MED ORDER — TRAZODONE HCL 50 MG PO TABS: 50.0000 mg | ORAL_TABLET | Freq: Every evening | ORAL | 1 refills | Status: DC | PRN

## 2019-12-27 ENCOUNTER — Encounter: Payer: Self-pay | Admitting: Internal Medicine

## 2019-12-27 MED ORDER — TRAZODONE HCL 50 MG PO TABS: 50.0000 mg | ORAL_TABLET | Freq: Every evening | ORAL | 1 refills | Status: DC | PRN

## 2020-01-02 ENCOUNTER — Encounter: Payer: Self-pay | Admitting: Internal Medicine

## 2020-01-02 MED ORDER — FUROSEMIDE 20 MG PO TABS: 20.0000 mg | ORAL_TABLET | Freq: Every day | ORAL | 3 refills | Status: DC | PRN

## 2020-01-02 MED ORDER — FUROSEMIDE 20 MG PO TABS: 20.0000 mg | ORAL_TABLET | Freq: Every day | ORAL | 0 refills | Status: DC | PRN

## 2020-01-02 NOTE — Telephone Encounter (Signed)
Per med list there is a furosemide 20 mg on file last rx back in 2019. pls advise on pt request../lmb

## 2020-02-11 ENCOUNTER — Encounter: Payer: Self-pay | Admitting: Cardiology

## 2020-02-11 ENCOUNTER — Ambulatory Visit (INDEPENDENT_AMBULATORY_CARE_PROVIDER_SITE_OTHER): Payer: Medicare Other | Admitting: Cardiology

## 2020-02-11 ENCOUNTER — Other Ambulatory Visit: Payer: Self-pay

## 2020-02-11 VITALS — BP 120/72 | HR 69 | Ht 64.0 in | Wt 139.4 lb

## 2020-02-11 DIAGNOSIS — I480 Paroxysmal atrial fibrillation: Secondary | ICD-10-CM | POA: Diagnosis not present

## 2020-02-11 NOTE — Progress Notes (Signed)
Electrophysiology Office Note   Date:  02/11/2020   ID:  Lauren, Ray 1941-07-16, MRN [id number]  PCP:  Lauren Borg, MD  Primary Electrophysiologist:  Lauren Haw, MD    No chief complaint on file.    History of Present Illness: [name] is a 79 y.o. female who presents today for electrophysiology evaluation.   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 Admitted 08/17/16 with ruptured colon status post right hemicolectomy with ileostomy for ischemic and necrotic colon.  Cardioversion 10/11/16. He also had ileostomy takedown and repair that was complicated by C. difficile.  Today, denies symptoms of palpitations, chest Ray, shortness of breath, orthopnea, PND, lower extremity edema, claudication, dizziness, presyncope, syncope, bleeding, or neurologic sequela. The patient is tolerating medications without difficulties.  She overall feels well.  She has no chest Ray or shortness of breath.  She is able to do most of her daily activities.  She has been walking up to a mile a day since the weather has gotten nicer.  She also has been working in her garden.  Past Medical History:  Diagnosis Date  . Allergic rhinitis   . Anxiety   . Atrial septal aneurysm   . Basal cell carcinoma   . Bradycardia   . Cataract    bil cataracts removed  . Chronic combined systolic and diastolic CHF (congestive heart failure) ([place])   . CKD (chronic kidney disease), stage III   . Clostridium difficile infection   . COPD (chronic obstructive pulmonary disease) ([place])   . Depression 11/29/2013  . DI (detrusor instability)   . Esophageal stricture   . GERD  (gastroesophageal reflux disease)   . Hemorrhoids   . Hiatal hernia   . History of kidney stones   . HLD (hyperlipidemia)   . HTN (hypertension)   . Ischemic colon ([place])    a. 08/2016 - adm to  [facility] with a ruptured colon s/p right hemicolectomy with ileostomy formation for ischemic and necrotic colon.   . Lung nodule    a. followed by pulmonology  . Mild CAD    a. minor nonobstructive CAD 08/22/16.  . Nephrolithiasis    hx  . NICM (nonischemic cardiomyopathy) ([place])   . Orthostatic hypotension   . Osteoarthritis   . PAF (paroxysmal atrial fibrillation) ([place])    a. diagnosed 07/2016 s/p DCCV 08/01/16 with recurrence, managed with rate control for now.   . Pneumonia 2013  . PVC's (premature ventricular contractions)   . Shingles since Nov 18, 2013   on right arm from shoulder to wrist  . Status post dilation of esophageal narrowing   . Stroke [facility]) 2001   STROKES, TIA'S  . Syncope    a. 08/2016 felt due to orthostatic hypotension.  Marland Kitchen TIA (transient ischemic attack) last 2001   anticoagulation therapy on coumadin   Past Surgical History:  Procedure Laterality Date  . ANTERIOR LATERAL LUMBAR FUSION 4 LEVELS Right 07/03/2015   Procedure: Right Lumbar one-two, Lumbar two-three, Lumbar three-four, Lumbar four-five Anterior lateral lumbar interbody fusion;  Surgeon: Lauren Levine, MD;  Location: [facility];  Service: Neurosurgery;  Laterality: Right;  Right L1-2 L2-3 L3-4 L4-5 Anterior lateral lumbar interbody fusion  .  BALLOON DILATION N/A 03/03/2014   Procedure: BALLOON DILATION;  Surgeon: Lauren Castle, MD;  Location: [facility] ENDOSCOPY;  Service: Endoscopy;  Laterality: N/A;  . BLADDER SUSPENSION    . BREAST BIOPSY    . CARDIAC CATHETERIZATION N/A 08/22/2016   Procedure: Left Heart Cath and Coronary Angiography;  Surgeon: Lauren M Martinique, MD;  Location: [place] CV LAB;  Service: Cardiovascular;  Laterality: N/A;  . CARDIOVERSION N/A 08/01/2016   Procedure: CARDIOVERSION;   Surgeon: Lauren Pain, MD;  Location: [place];  Service: Cardiovascular;  Laterality: N/A;  . CARDIOVERSION N/A 10/11/2016   Procedure: CARDIOVERSION;  Surgeon: Lauren Margarita, MD;  Location: [facility] ENDOSCOPY;  Service: Cardiovascular;  Laterality: N/A;  . COLONOSCOPY    . ESOPHAGOGASTRODUODENOSCOPY N/A 03/03/2014   Procedure: ESOPHAGOGASTRODUODENOSCOPY (EGD);  Surgeon: Lauren Castle, MD;  Location: [facility];  Service: Endoscopy;  Laterality: N/A;  . EXCISION OF BASAL CELL CA  2011  . FINGER SURGERY    . GLUTEUS MINIMUS REPAIR Right 03/15/2016   Procedure: RIGHT HIP GLUTEUS MEDIUS TENDON REPAIR;  Surgeon: Lauren Cancel, MD;  Location: [facility] ORS;  Service: Orthopedics;  Laterality: Right;  . ileostomy reversal    . LUMBAR PERCUTANEOUS PEDICLE SCREW 4 LEVEL Right 07/03/2015   Procedure: Lumbar one-Sacral one Bilateral percutaneous pedicle screws;  Surgeon: Lauren Levine, MD;  Location: [place] NEURO ORS;  Service: Neurosurgery;  Laterality: Right;  L1-S1 Bilateral percutaneous pedicle screws  . RECTOCELE REPAIR  2008   A&P REPAIR with vaginal repair-DR. MCDIARMID  . THUMB SURGERY Right 10-15 yrs ago  . UPPER GASTROINTESTINAL ENDOSCOPY    . VAGINAL HYSTERECTOMY  1983   NO BSO-DR. MABRY  . VARICOSE VEIN SURGERY       Current Outpatient Medications  Medication Sig Dispense Refill  . albuterol (VENTOLIN HFA) 108 (90 Base) MCG/ACT inhaler Inhale 1-2 puffs into the lungs every 6 (six) hours as needed for wheezing or shortness of breath. 8 g 6  . apixaban (ELIQUIS) 5 MG TABS tablet Take 1 tablet (5 mg total) by mouth 2 (two) times daily. 180 tablet 3  . azelastine (ASTELIN) 0.1 % nasal spray Place 2 sprays into the nose daily. Use in each nostril as directed 90 mL 1  . buPROPion (WELLBUTRIN XL) 300 MG 24 hr tablet Take 1 tablet (300 mg total) by mouth daily. 90 tablet 3  . Calcium Carbonate-Vitamin D (CALCIUM + D PO) Take 1 tablet by mouth 2 (two) times daily.     . colestipol (COLESTID) 1 g tablet  Take 2 tablets (2 g total) by mouth 2 (two) times daily. 360 tablet 3  . cyclobenzaprine (FLEXERIL) 5 MG tablet Take 1 tablet (5 mg total) by mouth 3 (three) times daily as needed for muscle spasms. 270 tablet 1  . fluticasone (FLONASE) 50 MCG/ACT nasal spray Place 1 spray into both nostrils 2 (two) times daily. 48 g 1  . Fluticasone-Umeclidin-Vilant (TRELEGY ELLIPTA) 100-62.5-25 MCG/INH AEPB Inhale 1 puff into the lungs daily. I inhalation once daily 180 each 1  . furosemide (LASIX) 20 MG tablet Take 1 tablet (20 mg total) by mouth daily as needed (swelling). 90 tablet 3  . gabapentin (NEURONTIN) 300 MG capsule 1 tab by mouth in AM, 1 po at midday, then 2 by mouth at bedtime for sleep and Ray 360 capsule 1  . guaiFENesin (MUCINEX) 600 MG 12 hr tablet Take 1,200 mg by mouth 2 (two) times daily as needed for cough or to loosen phlegm.    Marland Kitchen  hydroxychloroquine (PLAQUENIL) 200 MG tablet Take 400 mg by mouth at bedtime.    Marland Kitchen ipratropium (ATROVENT) 0.03 % nasal spray Place 2 sprays into both nostrils 3 (three) times daily as needed for rhinitis. 90 mL 1  . levothyroxine (SYNTHROID) 25 MCG tablet Take 1 tablet (25 mcg total) by mouth daily before breakfast. 90 tablet 2  . Lidocaine HCl 2 % CREA Apply 1 application topically daily as needed (for shingles flares).    Marland Kitchen loperamide (IMODIUM) 2 MG capsule Take by mouth.    . loratadine (CLARITIN) 10 MG tablet Take 10 mg by mouth daily.    . Melatonin 5 MG TABS Take 5 mg by mouth at bedtime.    . metoprolol succinate (TOPROL-XL) 100 MG 24 hr tablet Take 1 tablet (100 mg total) by mouth daily. Take with or immediately following a meal. 90 tablet 2  . mirabegron ER (MYRBETRIQ) 50 MG TB24 tablet Take 50 mg by mouth daily.    . montelukast (SINGULAIR) 10 MG tablet Take 1 tablet (10 mg total) by mouth daily. 90 tablet 3  . Multiple Vitamin (MULTIVITAMIN) tablet Take 1 tablet by mouth daily.      . NYSTATIN PO Take 5 mLs by mouth as needed.    Marland Kitchen omeprazole  (PRILOSEC) 40 MG capsule Take 1 capsule (40 mg total) by mouth daily. 90 capsule 3  . Probiotic Product (PROBIOTIC DAILY PO) Take by mouth.    . traZODone (DESYREL) 50 MG tablet Take 1 tablet (50 mg total) by mouth at bedtime and may repeat dose one time if needed. 180 tablet 1  . ezetimibe (ZETIA) 10 MG tablet Take 1 tablet (10 mg total) by mouth daily. 90 tablet 3   No current facility-administered medications for this visit.    Allergies:   Clarithromycin, Lipitor [atorvastatin], Simvastatin, Cardizem [diltiazem hcl], Contrast media [iodinated diagnostic agents], Crestor [rosuvastatin calcium], and Rosuvastatin calcium   Social History:  The patient  reports that she quit smoking about 24 years ago. Her smoking use included cigarettes. She started smoking about 65 years ago. She has a 126.00 pack-year smoking history. She has never used smokeless tobacco. She reports that she does not drink alcohol or use drugs.   Family History:  The patient's family history includes Breast cancer in her maternal aunt, maternal aunt, maternal aunt, and mother; COPD in her mother; Emphysema in her mother; Uterine cancer in her mother.  ROS:  Please see the history of present illness.   Otherwise, review of systems is positive for none.   All other systems are reviewed and negative.   PHYSICAL EXAM: VS:  BP 120/72   Pulse 69   Ht 5\' 4"  (1.626 m)   Wt 139 lb 6.4 oz (63.2 kg)   SpO2 96%   BMI 23.93 kg/m  , BMI Body mass index is 23.93 kg/m. GEN: Well nourished, well developed, in no acute distress  HEENT: normal  Neck: no JVD, carotid bruits, or masses Cardiac: RRR; no murmurs, rubs, or gallops,no edema  Respiratory:  clear to auscultation bilaterally, normal work of breathing GI: soft, nontender, nondistended, + BS MS: no deformity or atrophy  Skin: warm and dry Neuro:  Strength and sensation are intact Psych: euthymic mood, full affect  EKG:  EKG is ordered today. Personal review of the ekg  ordered shows sinus rhythm, PVCs, rate 69  Recent Labs: 10/29/2019: ALT 13; BUN 19; Creatinine, Ser 1.13; Hemoglobin 12.6; Platelets 186.0; Potassium 4.0; Sodium 136; TSH 2.34  Lipid Panel     Component Value Date/Time   CHOL 161 10/29/2019 1108   TRIG 175.0 (H) 10/29/2019 1108   HDL 55.70 10/29/2019 1108   CHOLHDL 3 10/29/2019 1108   VLDL 35.0 10/29/2019 1108   LDLCALC 70 10/29/2019 1108   LDLDIRECT 133.0 10/17/2018 1420     Wt Readings from Last 3 Encounters:  02/11/20 139 lb 6.4 oz (63.2 kg)  10/29/19 138 lb (62.6 kg)  09/23/19 138 lb 6.4 oz (62.8 kg)      Other studies Reviewed: Additional studies/ records that were reviewed today include: TTE 04/05/17, Cath 08/22/16, Telemetry 06/14/16  Review of the above records today demonstrates:  TTE - Left ventricle: The cavity size was normal. Wall thickness was   normal. Systolic function was mildly reduced. The estimated   ejection fraction was in the range of 45% to 50%. Diffuse   hypokinesis. Doppler parameters are consistent with abnormal left   ventricular relaxation (grade 1 diastolic dysfunction). Doppler   parameters are consistent with high ventricular filling pressure. - Aortic valve: There was mild stenosis. - Mitral valve: There was moderate regurgitation. - Left atrium: The atrium was mildly dilated. - Pulmonary arteries: Systolic pressure was mildly increased. PA   peak pressure: 40 mm Hg (S).   Cath  Prox LAD to Mid LAD lesion, 15 %stenosed.  Ost Cx to Prox Cx lesion, 15 %stenosed.  Prox RCA to Mid RCA lesion, 20 %stenosed.  There is mild left ventricular systolic dysfunction.  LV end diastolic pressure is normal.  The left ventricular ejection fraction is 45-50% by visual estimate.   1. Minor nonobstructive CAD 2. Mild LV dysfunction with distal inferior HK 3. Measurement of LV pressures limited by frequent PVCs but EDP appears normal.   Telemetry 1. NSR with PVC's, at times in a bigeminal  manner 2. Signal drop out as well as noise artifact are present.  3. No sustained atrial or ventricular arrhythmias 4. No pauses   ASSESSMENT AND PLAN:  1.  Paroxysmal atrial fibrillation: Currently on Eliquis and Toprol-XL.  CHA2DS2-VASc of 5.  Has been taken off of amiodarone.  Remains in sinus rhythm.  No changes.   2. Chronic combined systolic HF: Currently on Toprol-XL.  No signs of obvious volume overload.  3. HTN: Currently well controlled  4. PVCs: Found on cardiac monitor.  Having PVCs today.  She is minimally symptomatic.  No changes.   Current medicines are reviewed at length with the patient today.   The patient does not have concerns regarding her medicines.  The following changes were made today: None  Labs/ tests ordered today include:  Orders Placed This Encounter  Procedures  . EKG 12-Lead     Disposition:   FU with Shatonia Hoots 6 months  Signed, Eleesha Purkey Meredith Leeds, MD  02/11/2020 11:45 AM     [facility] [address] [place] [address] [phone] (office) [phone] (fax)

## 2020-02-11 NOTE — Patient Instructions (Signed)
Medication Instructions:  Your physician recommends that you continue on your current medications as directed. Please refer to the Current Medication list given to you today.  *If you need a refill on your cardiac medications before your next appointment, please call your pharmacy*   Lab Work: None ordered If you have labs (blood work) drawn today and your tests are completely normal, you will receive your results only by: . MyChart Message (if you have MyChart) OR . A paper copy in the mail If you have any lab test that is abnormal or we need to change your treatment, we will call you to review the results.   Testing/Procedures: None ordered   Follow-Up: At [facility], you and your health needs are our priority.  As part of our continuing mission to provide you with exceptional heart care, we have created designated Provider Care Teams.  These Care Teams include your primary Cardiologist (physician) and Advanced Practice Providers (APPs -  Physician Assistants and Nurse Practitioners) who all work together to provide you with the care you need, when you need it.  We recommend signing up for the patient portal called "MyChart".  Sign up information is provided on this After Visit Summary.  MyChart is used to connect with patients for Virtual Visits (Telemedicine).  Patients are able to view lab/test results, encounter notes, upcoming appointments, etc.  Non-urgent messages can be sent to your provider as well.   To learn more about what you can do with MyChart, go to [email or web address].    Your next appointment:   6 month(s)  The format for your next appointment:   In Person  Provider:   Will Camnitz, MD   Thank you for choosing [facility]!!   Brecklyn Galvis, RN [phone]     

## 2020-02-27 MED ORDER — AZELASTINE HCL 0.1 % NA SOLN: NASAL | 0 refills | Status: DC

## 2020-03-05 DIAGNOSIS — M25512 Pain in left shoulder: Secondary | ICD-10-CM | POA: Diagnosis not present

## 2020-03-23 ENCOUNTER — Ambulatory Visit (INDEPENDENT_AMBULATORY_CARE_PROVIDER_SITE_OTHER): Payer: Medicare Other | Admitting: Adult Health

## 2020-03-23 ENCOUNTER — Encounter: Payer: Self-pay | Admitting: Adult Health

## 2020-03-23 ENCOUNTER — Other Ambulatory Visit: Payer: Self-pay

## 2020-03-23 DIAGNOSIS — K219 Gastro-esophageal reflux disease without esophagitis: Secondary | ICD-10-CM

## 2020-03-23 DIAGNOSIS — J438 Other emphysema: Secondary | ICD-10-CM | POA: Diagnosis not present

## 2020-03-23 DIAGNOSIS — J309 Allergic rhinitis, unspecified: Secondary | ICD-10-CM

## 2020-03-23 NOTE — Assessment & Plan Note (Signed)
Controlled on current regimen.   

## 2020-03-23 NOTE — Assessment & Plan Note (Addendum)
COPD currently at baseline.  Encouraged on activity as tolerated.  Plan  Patient Instructions  Severe COPD:  Continue Trelegy 1 puff daily Rinse her mouth well after using Trelegy Practice good hand hygiene, social distancing and mask are key .  Stay active as able.   Gastroesophageal reflux disease: Continue taking omeprazole daily in am .  Continue taking famotidine in evening .   Chronic Rhinitis: Continue taking ipratropium nose spray as directed Continue loratadine 10 mg daily Continue Astelin nose spray daily  Continue fluticasone nose spray daily  Continue on Singulair daily.   We will see you back in 6 months with Dr. Valeta Harms or Tayquan Gassman NP and As needed

## 2020-03-23 NOTE — Assessment & Plan Note (Signed)
Stable on current regimen   

## 2020-03-23 NOTE — Progress Notes (Signed)
@Patient  ID: [name], female    DOB: [birth year], 79 y.o.   MRN: [id number]  Chief Complaint  Patient presents with   Follow-up    COPD     Referring provider: Biagio Borg, MD  HPI: 79 year old female former smoker followed for severe COPD, chronic rhinitis and GERD Stable lung nodule (serial CT chest from 2015 2018 showed stable 7 mm right lower lobe nodule consistent with benign etiology) Medical history significant for chronic combined congestive heart failure, A. fib, cardioversion 0000000, embolic stroke and chronic anticoagulation Previous colon rupture requiring right hemicolectomy with ileostomy for ischemic and necrotic colon in 2017.  Later ileostomy takedown and repair that was complicated by C. Difficile. Rheumatoid arthritis on Plaquenil.  TEST/EVENTS :  10/10/16:FVC 1.94 L (70%) FEV1 0.93 L (45%) FEV1/FVC 0.48 FEF 25-75 0.35 L (22%) positive bronchodilator response 03/01/16: FVC 2.55 L (91%) FEV1 1.30 L (62%) FEV1/FVC 0.51 FEF 25-75 0.56 L (34%) negative bronchodilator response TLC 5.66 L (111%) RV 116% ERV 133% DLCO uncorrected 62% (hemoglobin 15.1) 12/28/11:FVC 2.89 L (104%) FEV1 1.22 L (62%) FEV1/FVC 0.42 FEF 25-75 0.33 L (15%) negative bronchodilator response TLC 6.19 L (129%) RV 163% ERV 114% DLCO uncorrected 69%  6MWT 10/10/16: Walked 96 meters / Baseline Sat 93% on RA / Nadir Sat 93% on RA @ rest (couldn't finish due to weakness &dyspnea W/ 2:57 left) 08/24/16: Walked 1 lap &was 96% at lowest on room air  IMAGING CXR PA/LAT 06/19/17 :  CXR PA/LAT 05/18/17: Patchy opacification in right middle and lower lobes. No pleural effusion appreciated. Heart normal in size & mediastinum normal in contour.  CT CHEST W/O 01/23/17:Resolution of groundglass opacity. 7 mm right lower lobe nodule unchanged. Apical predominant emphysematous changes. Right middle lobe nodule was not appreciated on this image. No pleural effusion or thickening. No pathologic  mediastinal adenopathy. No pericardial effusion.  CTA CHEST 08/17/16:No evidence of PE. No pleural effusion or thickening. No pathologic mediastinal adenopathy. Groundglass noted within right lung predominantly in the upper lung zone. Moderate upper lobe predominant emphysema which has progressed since 2015. 7 mm right lower lobe nodule has not changed since 2015. Patient has a 4 mm nodule within right middle lobe which appears new.  CXR PA/LAT 05/25/16:Hyperinflation with flattening of the diaphragms. No parenchymal opacity or mass appreciated. No pleural effusion. Heart normal in size &mediastinum normal in contour.  CXR PA/LAT 03/22/16: Hyperinflation with elevation of left hemidiaphragm. No new focal opacity. Heart normal in size & mediastinum normal in contour.  CXR PA/LAT 11/09/15: No focal opacity or effusion appreciated. Heart normal in size. Mediastinum normal in contour. Mild hyperinflation with flattening of the diaphragms.   CARDIAC TTE (06/24/16):LVEF 45-50% with grade 2 diastolic dysfunction. LA moderately to severely dilated. RA normal in size. RV normal in size and function. No aortic stenosis or regurgitation. Moderate centrally directed mitral regurgitation. Paradoxical ventricular septal motion consistent with intraventricular conduction delay. No pulmonic regurgitation. No tricuspid regurgitation. No pericardial effusion.  MICROBIOLOGY Sputum Culture (07/05/17): Candida Species / AFB negative / Moderate Squamous Epithelials   LABS 12/22/15 Alpha-1 antitrypsin: MM (143) 11/2017 IgE 9 (normal)  03/23/2020 Follow up : COPD  Patient returns for a 22-month follow-up.  Patient has underlying COPD.  Says that she has been doing very well over the last few months.  Is feeling the best she has felt in a long time.  Has been walking and has gotten up to 1 mile despite using a cane.  Says she has  been feeling good.  She denies a flare of her cough or wheezing. Remains on Trelegy  daily.  Uses albuterol on occasion.  Denies any increased albuterol use  Has chronic rhinitis is taking Flonase and Singulair.  Uses Chlor-Trimeton on occasion.  She does have reflux.  Is controlled on Prilosec.    Allergies  Allergen Reactions   Clarithromycin Other (See Comments)    REACTION: possible rash but could have been legionarres dz with rash   Lipitor [Atorvastatin] Other (See Comments)    MUSCLE SPASMS   Simvastatin Other (See Comments)    Muscle and joint pain   Cardizem [Diltiazem Hcl] Hives   Contrast Media [Iodinated Diagnostic Agents]     FYI - during admission at [facility] 08/2016 patient developed hives, question related to cardizem or contrast dye - not clear   Crestor [Rosuvastatin Calcium]     fagitue and joint pain, "NO STATINS"   Rosuvastatin Calcium Other (See Comments)    fagitue and joint pain, "NO STATINS"    Immunization History  Administered Date(s) Administered   Fluad Quad(high Dose 65+) 08/08/2019   Influenza Split 09/11/2012   Influenza Whole 09/15/2006, 08/04/2010, 08/11/2011   Influenza, High Dose Seasonal PF 08/22/2017, 09/05/2018   Influenza,inj,Quad PF,6+ Mos 09/02/2013, 08/26/2014, 08/19/2015, 08/23/2016   Pneumococcal Conjugate-13 11/29/2013   Pneumococcal Polysaccharide-23 12/06/2003, 12/05/2006   Td 12/05/2004   Zoster 07/07/2008   Zoster Recombinat (Shingrix) 04/12/2017, 06/19/2017    Past Medical History:  Diagnosis Date   Allergic rhinitis    Anxiety    Atrial septal aneurysm    Basal cell carcinoma    Bradycardia    Cataract    bil cataracts removed   Chronic combined systolic and diastolic CHF (congestive heart failure) (HCC)    CKD (chronic kidney disease), stage III    Clostridium difficile infection    COPD (chronic obstructive pulmonary disease) ([place])    Depression 11/29/2013   DI (detrusor instability)    Esophageal stricture    GERD (gastroesophageal reflux disease)     Hemorrhoids    Hiatal hernia    History of kidney stones    HLD (hyperlipidemia)    HTN (hypertension)    Ischemic colon ([place])    a. 08/2016 - adm to  [facility] with a ruptured colon s/p right hemicolectomy with ileostomy formation for ischemic and necrotic colon.    Lung nodule    a. followed by pulmonology   Mild CAD    a. minor nonobstructive CAD 08/22/16.   Nephrolithiasis    hx   NICM (nonischemic cardiomyopathy) (HCC)    Orthostatic hypotension    Osteoarthritis    PAF (paroxysmal atrial fibrillation) ([place])    a. diagnosed 07/2016 s/p DCCV 08/01/16 with recurrence, managed with rate control for now.    Pneumonia 2013   PVC's (premature ventricular contractions)    Shingles since Nov 18, 2013   on right arm from shoulder to wrist   Status post dilation of esophageal narrowing    Stroke [facility]) 2001   STROKES, TIA'S   Syncope    a. 08/2016 felt due to orthostatic hypotension.   TIA (transient ischemic attack) last 2001   anticoagulation therapy on coumadin    Tobacco History: Social History   Tobacco Use  Smoking Status Former Smoker   Packs/day: 3.00   Years: 42.00   Pack years: 126.00   Types: Cigarettes   Start date: 08/04/1954   Quit date: 12/06/1995   Years since quitting: 24.3  Smokeless Tobacco Never Used   Counseling given: Not Answered   Outpatient Medications Prior to Visit  Medication Sig Dispense Refill   albuterol (VENTOLIN HFA) 108 (90 Base) MCG/ACT inhaler Inhale 1-2 puffs into the lungs every 6 (six) hours as needed for wheezing or shortness of breath. 8 g 6   apixaban (ELIQUIS) 5 MG TABS tablet Take 1 tablet (5 mg total) by mouth 2 (two) times daily. 180 tablet 3   azelastine (ASTELIN) 0.1 % nasal spray Place 2 sprays into the nose daily. Use in each nostril as directed 90 mL 0   buPROPion (WELLBUTRIN XL) 300 MG 24 hr tablet Take 1 tablet (300 mg total) by mouth daily. 90 tablet 3   Calcium Carbonate-Vitamin D  (CALCIUM + D PO) Take 1 tablet by mouth 2 (two) times daily.      cyclobenzaprine (FLEXERIL) 5 MG tablet Take 1 tablet (5 mg total) by mouth 3 (three) times daily as needed for muscle spasms. 270 tablet 1   fluticasone (FLONASE) 50 MCG/ACT nasal spray Place 1 spray into both nostrils 2 (two) times daily. 48 g 1   Fluticasone-Umeclidin-Vilant (TRELEGY ELLIPTA) 100-62.5-25 MCG/INH AEPB Inhale 1 puff into the lungs daily. I inhalation once daily 180 each 1   furosemide (LASIX) 20 MG tablet Take 1 tablet (20 mg total) by mouth daily as needed (swelling). 90 tablet 3   gabapentin (NEURONTIN) 300 MG capsule 1 tab by mouth in AM, 1 po at midday, then 2 by mouth at bedtime for sleep and pain 360 capsule 1   guaiFENesin (MUCINEX) 600 MG 12 hr tablet Take 1,200 mg by mouth 2 (two) times daily as needed for cough or to loosen phlegm.     hydroxychloroquine (PLAQUENIL) 200 MG tablet Take 400 mg by mouth at bedtime.     ipratropium (ATROVENT) 0.03 % nasal spray Place 2 sprays into both nostrils 3 (three) times daily as needed for rhinitis. 90 mL 1   levothyroxine (SYNTHROID) 25 MCG tablet Take 1 tablet (25 mcg total) by mouth daily before breakfast. 90 tablet 2   Lidocaine HCl 2 % CREA Apply 1 application topically daily as needed (for shingles flares).     loperamide (IMODIUM) 2 MG capsule Take by mouth.     loratadine (CLARITIN) 10 MG tablet Take 10 mg by mouth daily.     Melatonin 5 MG TABS Take 5 mg by mouth at bedtime.     metoprolol succinate (TOPROL-XL) 100 MG 24 hr tablet Take 1 tablet (100 mg total) by mouth daily. Take with or immediately following a meal. 90 tablet 2   mirabegron ER (MYRBETRIQ) 50 MG TB24 tablet Take 50 mg by mouth daily.     montelukast (SINGULAIR) 10 MG tablet Take 1 tablet (10 mg total) by mouth daily. 90 tablet 3   Multiple Vitamin (MULTIVITAMIN) tablet Take 1 tablet by mouth daily.       NYSTATIN PO Take 5 mLs by mouth as needed.     omeprazole (PRILOSEC) 40  MG capsule Take 1 capsule (40 mg total) by mouth daily. 90 capsule 3   Probiotic Product (PROBIOTIC DAILY PO) Take by mouth.     traZODone (DESYREL) 50 MG tablet Take 1 tablet (50 mg total) by mouth at bedtime and may repeat dose one time if needed. 180 tablet 1   colestipol (COLESTID) 1 g tablet Take 2 tablets (2 g total) by mouth 2 (two) times daily. 360 tablet 3   ezetimibe (ZETIA) 10 MG tablet  Take 1 tablet (10 mg total) by mouth daily. 90 tablet 3   No facility-administered medications prior to visit.     Review of Systems:   Constitutional:   No  weight loss, night sweats,  Fevers, chills, fatigue, or  lassitude.  HEENT:   No headaches,  Difficulty swallowing,  Tooth/dental problems, or  Sore throat,                No sneezing, itching, ear ache, nasal congestion, post nasal drip,   CV:  No chest pain,  Orthopnea, PND, swelling in lower extremities, anasarca, dizziness, palpitations, syncope.   GI  No heartburn, indigestion, abdominal pain, nausea, vomiting, diarrhea, change in bowel habits, loss of appetite, bloody stools.   Resp: Skin: no rash or lesions.  GU: no dysuria, change in color of urine, no urgency or frequency.  No flank pain, no hematuria   MS:  No joint pain or swelling.  No decreased range of motion.  No back pain.    Physical Exam  BP 116/68    Pulse (!) 58    Temp (!) 97.2 F (36.2 C) (Temporal)    Ht 5\' 4"  (1.626 m)    Wt 136 lb 12.8 oz (62.1 kg)    SpO2 95% Comment: on RA   BMI 23.48 kg/m   GEN: A/Ox3; pleasant , NAD, well nourished , cane    HEENT:  [place]/AT,  EACs-clear, TMs-wnl, NOSE-clear, THROAT-clear, no lesions, no postnasal drip or exudate noted.   NECK:  Supple w/ fair ROM; no JVD; normal carotid impulses w/o bruits; no thyromegaly or nodules palpated; no lymphadenopathy.    RESP  Clear  P & A; w/o, wheezes/ rales/ or rhonchi. no accessory muscle use, no dullness to percussion  CARD:  RRR, no m/r/g, no peripheral edema, pulses intact, no  cyanosis or clubbing.  GI:   Soft & nt; nml bowel sounds; no organomegaly or masses detected.   Musco: Warm bil, no deformities or joint swelling noted.   Neuro: alert, no focal deficits noted.    Skin: Warm, no lesions or rashes    Lab Results:    BNP  Imaging: No results found.    PFT Results Latest Ref Rng & Units 10/10/2016 03/01/2016  FVC-Pre L 1.94 2.55  FVC-Predicted Pre % 70 91  FVC-Post L 2.17 2.72  FVC-Predicted Post % 78 97  Pre FEV1/FVC % % 48 51  Post FEV1/FCV % % 51 53  FEV1-Pre L 0.93 1.30  FEV1-Predicted Pre % 45 62  FEV1-Post L 1.11 1.44  DLCO UNC% % - 65  DLCO COR %Predicted % - 71  TLC L - 5.66  TLC % Predicted % - 111  RV % Predicted % - 116    No results found for: NITRICOXIDE      Assessment & Plan:   COPD (chronic obstructive pulmonary disease) (HCC) COPD currently at baseline.  Encouraged on activity as tolerated.  Patient's medications were reviewed today and patient education was given. Computerized medication calendar was adjusted/completed   Plan  Patient Instructions  Severe COPD:  Continue Trelegy 1 puff daily Rinse her mouth well after using Trelegy Practice good hand hygiene, social distancing and mask are key .  Stay active as able.   Gastroesophageal reflux disease: Continue taking omeprazole daily in am .  Continue taking famotidine in evening .   Chronic Rhinitis: Continue taking ipratropium nose spray as directed Continue loratadine 10 mg daily Continue Astelin nose spray daily  Continue  fluticasone nose spray daily  Continue on Singulair daily.   We will see you back in 6 months with Dr. Valeta Harms or Shoji Pertuit NP and As needed       Allergic rhinitis Stable on current regimen  GERD Controlled on current regimen     Rexene Edison, NP 03/23/2020

## 2020-03-23 NOTE — Patient Instructions (Addendum)
Severe COPD:  Continue Trelegy 1 puff daily Rinse her mouth well after using Trelegy Practice good hand hygiene, social distancing and mask are key .  Stay active as able.   Gastroesophageal reflux disease: Continue taking omeprazole daily in am .  Continue taking famotidine in evening .   Chronic Rhinitis: Continue taking ipratropium nose spray as directed Continue loratadine 10 mg daily Continue Astelin nose spray daily  Continue fluticasone nose spray daily  Continue on Singulair daily.   We will see you back in 6 months with Dr. Valeta Harms or Remonia Otte NP and As needed

## 2020-04-09 ENCOUNTER — Telehealth: Payer: Self-pay | Admitting: Internal Medicine

## 2020-04-09 NOTE — Telephone Encounter (Signed)
Patient is requesting that we send a 30 day supply of Colestid to [facility] on [address] and also the reg refill to [facility] by mail patient would like a call once done

## 2020-04-10 MED ORDER — COLESTIPOL HCL 1 G PO TABS: 2.0000 g | ORAL_TABLET | Freq: Two times a day (BID) | ORAL | 0 refills | Status: DC

## 2020-04-10 MED ORDER — COLESTIPOL HCL 1 G PO TABS: 2.0000 g | ORAL_TABLET | Freq: Two times a day (BID) | ORAL | 1 refills | Status: DC

## 2020-04-10 NOTE — Telephone Encounter (Signed)
Sent both prescriptions as requested and called patient to let her know.  She acknowledged and understood

## 2020-04-10 NOTE — Telephone Encounter (Signed)
Pt called requesting rf for colestid. She stated that she missed her dose today and she really needs this medication. She would appreciate any help expediting rf.

## 2020-04-13 ENCOUNTER — Other Ambulatory Visit: Payer: Self-pay | Admitting: Obstetrics & Gynecology

## 2020-04-13 ENCOUNTER — Telehealth: Payer: Self-pay | Admitting: Internal Medicine

## 2020-04-13 DIAGNOSIS — Z1231 Encounter for screening mammogram for malignant neoplasm of breast: Secondary | ICD-10-CM

## 2020-04-13 NOTE — Telephone Encounter (Signed)
ERROR

## 2020-04-16 ENCOUNTER — Encounter: Payer: Self-pay | Admitting: Internal Medicine

## 2020-04-16 MED ORDER — GABAPENTIN 300 MG PO CAPS: ORAL_CAPSULE | ORAL | 1 refills | Status: DC

## 2020-04-28 ENCOUNTER — Ambulatory Visit: Payer: Medicare Other | Admitting: Internal Medicine

## 2020-04-30 ENCOUNTER — Other Ambulatory Visit: Payer: Self-pay

## 2020-04-30 ENCOUNTER — Ambulatory Visit (INDEPENDENT_AMBULATORY_CARE_PROVIDER_SITE_OTHER): Payer: Medicare Other | Admitting: Internal Medicine

## 2020-04-30 ENCOUNTER — Ambulatory Visit: Payer: Medicare Other | Admitting: Internal Medicine

## 2020-04-30 ENCOUNTER — Encounter: Payer: Self-pay | Admitting: Internal Medicine

## 2020-04-30 VITALS — BP 118/80 | HR 63 | Temp 98.1°F | Ht 64.0 in | Wt 136.0 lb

## 2020-04-30 DIAGNOSIS — E7800 Pure hypercholesterolemia, unspecified: Secondary | ICD-10-CM | POA: Diagnosis not present

## 2020-04-30 DIAGNOSIS — J438 Other emphysema: Secondary | ICD-10-CM | POA: Diagnosis not present

## 2020-04-30 DIAGNOSIS — F418 Other specified anxiety disorders: Secondary | ICD-10-CM

## 2020-04-30 DIAGNOSIS — I10 Essential (primary) hypertension: Secondary | ICD-10-CM | POA: Diagnosis not present

## 2020-04-30 NOTE — Progress Notes (Signed)
Subjective:    Patient ID: [name], female    DOB: 08/26/1941, 79 y.o.   MRN: [id number]  HPI  Here to f/u; overall doing ok,  Pt denies chest pain, increasing sob or doe, wheezing, orthopnea, PND, increased LE swelling, palpitations, dizziness or syncope.  Pt denies new neurological symptoms such as new headache, or facial or extremity weakness or numbness.  Pt denies polydipsia, polyuria, or low sugar episode.  Pt states overall good compliance with meds, mostly trying to follow appropriate diet, with wt overall stable,  but little exercise however.  Denies worsening depressive symptoms, suicidal ideation, or panic; has ongoing anxiety Wt Readings from Last 3 Encounters:  04/30/20 136 lb (61.7 kg)  03/23/20 136 lb 12.8 oz (62.1 kg)  02/11/20 139 lb 6.4 oz (63.2 kg)  Only c/o bilateral hand djd, using volt gel but cant do buttons or fine detailed movement. Also with HCQ per rheumatology for erosive arthritis. Past Medical History:  Diagnosis Date  . Allergic rhinitis   . Anxiety   . Atrial septal aneurysm   . Basal cell carcinoma   . Bradycardia   . Cataract    bil cataracts removed  . Chronic combined systolic and diastolic CHF (congestive heart failure) ([place])   . CKD (chronic kidney disease), stage III   . Clostridium difficile infection   . COPD (chronic obstructive pulmonary disease) ([place])   . Depression 11/29/2013  . DI (detrusor instability)   . Esophageal stricture   . GERD (gastroesophageal reflux disease)   . Hemorrhoids   . Hiatal hernia   . History of kidney stones   . HLD (hyperlipidemia)   . HTN (hypertension)   . Ischemic colon ([place])    a. 08/2016 - adm to  [facility] with a ruptured colon s/p right hemicolectomy with ileostomy formation for ischemic and necrotic colon.   . Lung nodule    a. followed by pulmonology  . Mild CAD    a. minor nonobstructive CAD 08/22/16.  . Nephrolithiasis    hx  . NICM (nonischemic cardiomyopathy) ([place])   .  Orthostatic hypotension   . Osteoarthritis   . PAF (paroxysmal atrial fibrillation) ([place])    a. diagnosed 07/2016 s/p DCCV 08/01/16 with recurrence, managed with rate control for now.   . Pneumonia 2013  . PVC's (premature ventricular contractions)   . Shingles since Nov 18, 2013   on right arm from shoulder to wrist  . Status post dilation of esophageal narrowing   . Stroke [facility]) 2001   STROKES, TIA'S  . Syncope    a. 08/2016 felt due to orthostatic hypotension.  Marland Kitchen TIA (transient ischemic attack) last 2001   anticoagulation therapy on coumadin   Past Surgical History:  Procedure Laterality Date  . ANTERIOR LATERAL LUMBAR FUSION 4 LEVELS Right 07/03/2015   Procedure: Right Lumbar one-two, Lumbar two-three, Lumbar three-four, Lumbar four-five Anterior lateral lumbar interbody fusion;  Surgeon: Erline Levine, MD;  Location: [place] NE[facility]O ORS;  Service: Neurosurgery;  Laterality: Right;  Right L1-2 L2-3 L3-4 L4-5 Anterior lateral lumbar interbody fusion  . BALLOON DILATION N/A 03/03/2014   Procedure: BALLOON DILATION;  Surgeon: Inda Castle, MD;  [facility];  Service: Endoscopy;  Laterality: N/A;  . BLADDER SUSPENSION    . BREAST BIOPSY    . CARDIAC CATHETERIZATION N/A 08/22/2016   Procedure: Left Heart Cath and Coronary Angiography;  Surgeon: Peter M Martinique, MD;  Location: [place] CV LAB;  Service: Cardiovascular;  Laterality: N/A;  .  CARDIOVERSION N/A 08/01/2016   Procedure: CARDIOVERSION;  Surgeon: Jerline Pain, MD;  Location: [place];  Service: Cardiovascular;  Laterality: N/A;  . CARDIOVERSION N/A 10/11/2016   Procedure: CARDIOVERSION;  Surgeon: Sueanne Margarita, MD;  [facility];  Service: Cardiovascular;  Laterality: N/A;  . COLONOSCOPY    . ESOPHAGOGASTRODUODENOSCOPY N/A 03/03/2014   Procedure: ESOPHAGOGASTRODUODENOSCOPY (EGD);  Surgeon: Inda Castle, MD;  Location: [facility];  Service: Endoscopy;  Laterality: N/A;  . EXCISION OF BASAL CELL CA  2011  .  FINGER SURGERY    . GLUTEUS MINIMUS REPAIR Right 03/15/2016   Procedure: RIGHT HIP GLUTEUS MEDIUS TENDON REPAIR;  Surgeon: Paralee Cancel, MD;  [facility];  Service: Orthopedics;  Laterality: Right;  . ileostomy reversal    . LUMBAR PERCUTANEOUS PEDICLE SCREW 4 LEVEL Right 07/03/2015   Procedure: Lumbar one-Sacral one Bilateral percutaneous pedicle screws;  Surgeon: Erline Levine, MD;  Location: [place] NEURO ORS;  Service: Neurosurgery;  Laterality: Right;  L1-S1 Bilateral percutaneous pedicle screws  . RECTOCELE REPAIR  2008   A&P REPAIR with vaginal repair-DR. MCDIARMID  . THUMB SURGERY Right 10-15 yrs ago  . UPPER GASTROINTESTINAL ENDOSCOPY    . VAGINAL HYSTERECTOMY  1983   NO BSO-DR. MABRY  . VARICOSE VEIN SURGERY      reports that she quit smoking about 24 years ago. Her smoking use included cigarettes. She started smoking about 65 years ago. She has a 126.00 pack-year smoking history. She has never used smokeless tobacco. She reports that she does not drink alcohol or use drugs. family history includes Breast cancer in her maternal aunt, maternal aunt, maternal aunt, and mother; COPD in her mother; Emphysema in her mother; Uterine cancer in her mother. Allergies  Allergen Reactions  . Clarithromycin Other (See Comments)    REACTION: possible rash but could have been legionarres dz with rash  . Lipitor [Atorvastatin] Other (See Comments)    MUSCLE SPASMS  . Simvastatin Other (See Comments)    Muscle and joint pain  . Cardizem [Diltiazem Hcl] Hives  . Contrast Media [Iodinated Diagnostic Agents]     FYI - during admission at [facility] 08/2016 patient developed hives, question related to cardizem or contrast dye - not clear  . Crestor [Rosuvastatin Calcium]     fagitue and joint pain, "NO STATINS"  . Rosuvastatin Calcium Other (See Comments)    fagitue and joint pain, "NO STATINS"   Current Outpatient Medications on File Prior to Visit  Medication Sig Dispense Refill  .  albuterol (VENTOLIN HFA) 108 (90 Base) MCG/ACT inhaler Inhale 1-2 puffs into the lungs every 6 (six) hours as needed for wheezing or shortness of breath. 8 g 6  . apixaban (ELIQUIS) 5 MG TABS tablet Take 1 tablet (5 mg total) by mouth 2 (two) times daily. 180 tablet 3  . azelastine (ASTELIN) 0.1 % nasal spray Place 2 sprays into the nose daily. Use in each nostril as directed 90 mL 0  . buPROPion (WELLBUTRIN XL) 300 MG 24 hr tablet Take 1 tablet (300 mg total) by mouth daily. 90 tablet 3  . Calcium Carbonate-Vitamin D (CALCIUM + D PO) Take 1 tablet by mouth 2 (two) times daily.     . colestipol (COLESTID) 1 g tablet Take 2 tablets (2 g total) by mouth 2 (two) times daily. 360 tablet 1  . cyclobenzaprine (FLEXERIL) 5 MG tablet Take 1 tablet (5 mg total) by mouth 3 (three) times daily as needed for muscle spasms. 270 tablet  1  . fluticasone (FLONASE) 50 MCG/ACT nasal spray Place 1 spray into both nostrils 2 (two) times daily. 48 g 1  . Fluticasone-Umeclidin-Vilant (TRELEGY ELLIPTA) 100-62.5-25 MCG/INH AEPB Inhale 1 puff into the lungs daily. I inhalation once daily 180 each 1  . furosemide (LASIX) 20 MG tablet Take 1 tablet (20 mg total) by mouth daily as needed (swelling). 90 tablet 3  . gabapentin (NEURONTIN) 300 MG capsule 1 tab by mouth in AM, 1 po at midday, then 2 by mouth at bedtime for sleep and pain 360 capsule 1  . guaiFENesin (MUCINEX) 600 MG 12 hr tablet Take 1,200 mg by mouth 2 (two) times daily as needed for cough or to loosen phlegm.    . hydroxychloroquine (PLAQUENIL) 200 MG tablet Take 400 mg by mouth at bedtime.    Marland Kitchen ipratropium (ATROVENT) 0.03 % nasal spray Place 2 sprays into both nostrils 3 (three) times daily as needed for rhinitis. 90 mL 1  . levothyroxine (SYNTHROID) 25 MCG tablet Take 1 tablet (25 mcg total) by mouth daily before breakfast. 90 tablet 2  . Lidocaine HCl 2 % CREA Apply 1 application topically daily as needed (for shingles flares).    Marland Kitchen loperamide (IMODIUM) 2 MG  capsule Take by mouth.    . loratadine (CLARITIN) 10 MG tablet Take 10 mg by mouth daily.    . Melatonin 5 MG TABS Take 5 mg by mouth at bedtime.    . metoprolol succinate (TOPROL-XL) 100 MG 24 hr tablet Take 1 tablet (100 mg total) by mouth daily. Take with or immediately following a meal. 90 tablet 2  . mirabegron ER (MYRBETRIQ) 50 MG TB24 tablet Take 50 mg by mouth daily.    . montelukast (SINGULAIR) 10 MG tablet Take 1 tablet (10 mg total) by mouth daily. 90 tablet 3  . Multiple Vitamin (MULTIVITAMIN) tablet Take 1 tablet by mouth daily.      . NYSTATIN PO Take 5 mLs by mouth as needed.    Marland Kitchen omeprazole (PRILOSEC) 40 MG capsule Take 1 capsule (40 mg total) by mouth daily. 90 capsule 3  . Probiotic Product (PROBIOTIC DAILY PO) Take by mouth.    . traZODone (DESYREL) 50 MG tablet Take 1 tablet (50 mg total) by mouth at bedtime and may repeat dose one time if needed. 180 tablet 1  . ezetimibe (ZETIA) 10 MG tablet Take 1 tablet (10 mg total) by mouth daily. 90 tablet 3   No current facility-administered medications on file prior to visit.   Review of Systems All otherwise neg per pt     Objective:   Physical Exam BP 118/80 (BP Location: Left Arm, Patient Position: Sitting, Cuff Size: Normal)   Pulse 63   Temp 98.1 F (36.7 C) (Oral)   Ht 5\' 4"  (1.626 m)   Wt 136 lb (61.7 kg)   SpO2 94%   BMI 23.34 kg/m  VS noted,  Constitutional: Pt appears in NAD HENT: Head: NCAT.  Right Ear: External ear normal.  Left Ear: External ear normal.  Eyes: . Pupils are equal, round, and reactive to light. Conjunctivae and EOM are normal Nose: without d/c or deformity Neck: Neck supple. Gross normal ROM Cardiovascular: Normal rate and regular rhythm.   Pulmonary/Chest: Effort normal and breath sounds without rales or wheezing.  Abd:  Soft, NT, ND, + BS, no organomegaly Neurological: Pt is alert. At baseline orientation, motor grossly intact Skin: Skin is warm. No rashes, other new lesions, no LE  edema Psychiatric:  Pt behavior is normal without agitation  All otherwise neg per pt Lab Results  Component Value Date   WBC 6.5 10/29/2019   HGB 12.6 10/29/2019   HCT 39.1 10/29/2019   PLT 186.0 10/29/2019   GLUCOSE 80 10/29/2019   CHOL 161 10/29/2019   TRIG 175.0 (H) 10/29/2019   HDL 55.70 10/29/2019   LDLDIRECT 133.0 10/17/2018   LDLCALC 70 10/29/2019   ALT 13 10/29/2019   AST 20 10/29/2019   NA 136 10/29/2019   K 4.0 10/29/2019   CL 100 10/29/2019   CREATININE 1.13 10/29/2019   BUN 19 10/29/2019   CO2 29 10/29/2019   TSH 2.34 10/29/2019   INR 1.21 08/17/2016   HGBA1C 5.7 10/29/2019      Assessment & Plan:

## 2020-04-30 NOTE — Patient Instructions (Signed)
Please continue all other medications as before, and refills have been done if requested. ° °Please have the pharmacy call with any other refills you may need. ° °Please continue your efforts at being more active, low cholesterol diet, and weight control. ° °You are otherwise up to date with prevention measures today. ° °Please keep your appointments with your specialists as you may have planned ° °Please make an Appointment to return in 6 months, or sooner if needed °

## 2020-05-01 ENCOUNTER — Encounter: Payer: Self-pay | Admitting: Internal Medicine

## 2020-05-01 NOTE — Assessment & Plan Note (Signed)
stable overall by history and exam, recent data reviewed with pt, and pt to continue medical treatment as before,  to f/u any worsening symptoms or concerns  

## 2020-05-01 NOTE — Assessment & Plan Note (Addendum)
stable overall by history and exam, recent data reviewed with pt, and pt to continue medical treatment as before,  to f/u any worsening symptoms or concerns  I spent 31 minutes in preparing to see the patient by review of recent labs, imaging and procedures, obtaining and reviewing separately obtained history, communicating with the patient and family or caregiver, ordering medications, tests or procedures, and documenting clinical information in the EHR including the differential Dx, treatment, and any further evaluation and other management of copd, hld, htn, depression anxiety

## 2020-05-14 ENCOUNTER — Encounter: Payer: Self-pay | Admitting: Internal Medicine

## 2020-05-14 ENCOUNTER — Ambulatory Visit (INDEPENDENT_AMBULATORY_CARE_PROVIDER_SITE_OTHER): Payer: Medicare Other | Admitting: Internal Medicine

## 2020-05-14 VITALS — BP 114/56 | HR 72 | Ht 63.0 in | Wt 135.0 lb

## 2020-05-14 DIAGNOSIS — R159 Full incontinence of feces: Secondary | ICD-10-CM

## 2020-05-14 DIAGNOSIS — K2100 Gastro-esophageal reflux disease with esophagitis, without bleeding: Secondary | ICD-10-CM | POA: Diagnosis not present

## 2020-05-14 DIAGNOSIS — K222 Esophageal obstruction: Secondary | ICD-10-CM

## 2020-05-14 DIAGNOSIS — R197 Diarrhea, unspecified: Secondary | ICD-10-CM

## 2020-05-14 MED ORDER — COLESTIPOL HCL 1 G PO TABS: 2.0000 g | ORAL_TABLET | Freq: Two times a day (BID) | ORAL | 3 refills | Status: DC

## 2020-05-14 NOTE — Patient Instructions (Signed)
We have sent the following medications to your pharmacy for you to pick up at your convenience:  Colestid  Please follow up as needed

## 2020-05-14 NOTE — Progress Notes (Signed)
HISTORY OF PRESENT ILLNESS:  [name] is a 79 y.o. female who is status post right hemicolectomy with temporary ileostomy and subsequent reversal was evaluated November 2019 regarding chronic diarrhea and increased gas.  Was treated with Colestid and metronidazole.  Metronidazole gas.  Colestid improved diarrhea but not completely.  Advised to add Imodium.  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  Describes regular bowel movements which are formed without urgency or incontinence.  She has continued on Colestid 2 g twice daily and Imodium once daily.  She has done well until approximately 6 months ago when she began to develop occasional loose bowels.  Sometimes with abdominal cramping discomfort urgency.  Overall she reports 1 formed bowel movement daily.  These irregularities were occurring approximately once per week.  She had several episodes of nocturnal incontinence.  There was no awareness.  If she takes 2 Imodium she develops constipation.  Occasionally will require laxative.  He is accompanied today by her husband.  No problems or issues.  Eating well.  No bleeding.  She has been using protective garments at night.  Over the past month she has been using additional Imodium every other night.  This has been helpful.  Presently colonoscopy 2014 revealed mild diverticulosis only.  She also has a history of GERD complicated by peptic stricture for which she has required prior esophageal dilation.  Currently asymptomatic on omeprazole post dilation.  She is accompanied today by her husband.  They have completed her Covid vaccinations. REVIEW OF SYSTEMS:  All non-GI ROS negative otherwise stated in the HPI except for anxiety  Past Medical History:  Diagnosis Date  . Allergic rhinitis   . Anxiety   . Atrial septal aneurysm   . Basal cell carcinoma   . Bradycardia   . Cataract    bil cataracts removed  . Chronic combined systolic and diastolic CHF  (congestive heart failure) ([place])   . CKD (chronic kidney disease), stage III   . Clostridium difficile infection   . COPD (chronic obstructive pulmonary disease) ([place])   . Depression 11/29/2013  . DI (detrusor instability)   . Esophageal stricture   . GERD (gastroesophageal reflux disease)   . Hemorrhoids   . Hiatal hernia   . History of kidney stones   . HLD (hyperlipidemia)   . HTN (hypertension)   . Ischemic colon ([place])    a. 08/2016 - adm to  [facility] with a ruptured colon s/p right hemicolectomy with ileostomy formation for ischemic and necrotic colon.   . Lung nodule    a. followed by pulmonology  . Mild CAD    a. minor nonobstructive CAD 08/22/16.  . Nephrolithiasis    hx  . NICM (nonischemic cardiomyopathy) ([place])   . Orthostatic hypotension   . Osteoarthritis   . PAF (paroxysmal atrial fibrillation) ([place])    a. diagnosed 07/2016 s/p DCCV 08/01/16 with recurrence, managed with rate control for now.   . Pneumonia 2013  . PVC's (premature ventricular contractions)   . Shingles since Nov 18, 2013   on right arm from shoulder to wrist  . Status post dilation of esophageal narrowing   . Stroke [facility]) 2001   STROKES, TIA'S  . Syncope    a. 08/2016 felt due to orthostatic hypotension.  Marland Kitchen TIA (transient ischemic attack) last 2001   anticoagulation therapy on coumadin    Past Surgical History:  Procedure Laterality Date  . ANTERIOR LATERAL LUMBAR FUSION 4  LEVELS Right 07/03/2015   Procedure: Right Lumbar one-two, Lumbar two-three, Lumbar three-four, Lumbar four-five Anterior lateral lumbar interbody fusion;  Surgeon: Erline Levine, MD;  Location: [place] NEURO ORS;  Service: Neurosurgery;  Laterality: Right;  Right L1-2 L2-3 L3-4 L4-5 Anterior lateral lumbar interbody fusion  . BALLOON DILATION N/A 03/03/2014   Procedure: BALLOON DILATION;  Surgeon: Inda Castle, MD;  [facility];  Service: Endoscopy;  Laterality: N/A;  . BLADDER SUSPENSION    . BREAST BIOPSY     . CARDIAC CATHETERIZATION N/A 08/22/2016   Procedure: Left Heart Cath and Coronary Angiography;  Surgeon: Peter M Martinique, MD;  Location: [place] CV LAB;  Service: Cardiovascular;  Laterality: N/A;  . CARDIOVERSION N/A 08/01/2016   Procedure: CARDIOVERSION;  Surgeon: Jerline Pain, MD;  Location: [place];  Service: Cardiovascular;  Laterality: N/A;  . CARDIOVERSION N/A 10/11/2016   Procedure: CARDIOVERSION;  Surgeon: Sueanne Margarita, MD;  [facility];  Service: Cardiovascular;  Laterality: N/A;  . COLONOSCOPY    . ESOPHAGOGASTRODUODENOSCOPY N/A 03/03/2014   Procedure: ESOPHAGOGASTRODUODENOSCOPY (EGD);  Surgeon: Inda Castle, MD;  Location: [facility];  Service: Endoscopy;  Laterality: N/A;  . EXCISION OF BASAL CELL CA  2011  . FINGER SURGERY    . GLUTEUS MINIMUS REPAIR Right 03/15/2016   Procedure: RIGHT HIP GLUTEUS MEDIUS TENDON REPAIR;  Surgeon: Paralee Cancel, MD;  [facility];  Service: Orthopedics;  Laterality: Right;  . ileostomy reversal    . LUMBAR PERCUTANEOUS PEDICLE SCREW 4 LEVEL Right 07/03/2015   Procedure: Lumbar one-Sacral one Bilateral percutaneous pedicle screws;  Surgeon: Erline Levine, MD;  Location: [place] NEURO ORS;  Service: Neurosurgery;  Laterality: Right;  L1-S1 Bilateral percutaneous pedicle screws  . RECTOCELE REPAIR  2008   A&P REPAIR with vaginal repair-DR. MCDIARMID  . THUMB SURGERY Right 10-15 yrs ago  . UPPER GASTROINTESTINAL ENDOSCOPY    . VAGINAL HYSTERECTOMY  1983   NO BSO-DR. MABRY  . VARICOSE VEIN SURGERY      Social History SHYLIE POLO  reports that she quit smoking about 24 years ago. Her smoking use included cigarettes. She started smoking about 65 years ago. She has a 126.00 pack-year smoking history. She has never used smokeless tobacco. She reports that she does not drink alcohol and does not use drugs.  family history includes Breast cancer in her maternal aunt, maternal aunt, maternal aunt, and mother; COPD in her mother;  Emphysema in her mother; Uterine cancer in her mother.  Allergies  Allergen Reactions  . Clarithromycin Other (See Comments)    REACTION: possible rash but could have been legionarres dz with rash  . Lipitor [Atorvastatin] Other (See Comments)    MUSCLE SPASMS  . Simvastatin Other (See Comments)    Muscle and joint pain  . Cardizem [Diltiazem Hcl] Hives  . Contrast Media [Iodinated Diagnostic Agents]     FYI - during admission at [facility] 08/2016 patient developed hives, question related to cardizem or contrast dye - not clear  . Crestor [Rosuvastatin Calcium]     fagitue and joint pain, "NO STATINS"  . Rosuvastatin Calcium Other (See Comments)    fagitue and joint pain, "NO STATINS"       PHYSICAL EXAMINATION: Vital signs: BP (!) 114/56   Pulse 72   Ht 5\' 3"  (1.6 m)   Wt 135 lb (61.2 kg)   BMI 23.91 kg/m   Constitutional: generally well-appearing, no acute distress Psychiatric: alert and oriented x3, cooperative Eyes: extraocular movements  intact, anicteric, conjunctiva pink Mouth: oral pharynx moist, no lesions Neck: supple no lymphadenopathy Cardiovascular: heart regular rate and rhythm, no murmur Lungs: clear to auscultation bilaterally Abdomen: soft, nontender, nondistended, no obvious ascites, no peritoneal signs, normal bowel sounds, no organomegaly Rectal: Omitted Extremities: no clubbing, cyanosis, or lower extremity edema bilaterally Skin: no lesions on visible extremities Neuro: No focal deficits.  Cranial nerves intact  ASSESSMENT:  1.  Diarrhea after colostomy reversal with partial bowel obstruction.  Improved with a combination of Colestid and Imodium.  Occasional episodes of urgency and nocturnal incontinence as described.  No worrisome features. 2.  GERD complicated by peptic stricture.  Asymptomatic post dilation.  On PPI  PLAN:  1.  Continue Colestid 2 g twice daily. 2.  Continue Imodium as needed 3.  Protective undergarments at night 4.   Continue PPI 5.  GI follow-up as needed for worsening issues and/or recurrent dysphagia. Total time of 30 minutes was spent preparing to see the patient, reviewing test, obtaining history, performing physical exam, counseling the patient and her husband regarding her condition, reviewing proper medication schedule, and documenting clinical information in the health record

## 2020-05-25 ENCOUNTER — Ambulatory Visit
Admission: RE | Admit: 2020-05-25 | Discharge: 2020-05-25 | Disposition: A | Discharge status: Home / Self Care | Payer: Medicare Other | Source: Ambulatory Visit | Attending: Obstetrics & Gynecology | Admitting: Obstetrics & Gynecology

## 2020-05-25 ENCOUNTER — Other Ambulatory Visit: Payer: Self-pay

## 2020-05-25 DIAGNOSIS — Z1231 Encounter for screening mammogram for malignant neoplasm of breast: Secondary | ICD-10-CM

## 2020-05-28 ENCOUNTER — Encounter: Payer: Self-pay | Admitting: Internal Medicine

## 2020-05-28 MED ORDER — MONTELUKAST SODIUM 10 MG PO TABS: 10.0000 mg | ORAL_TABLET | Freq: Every day | ORAL | 1 refills | Status: DC

## 2020-06-03 DIAGNOSIS — M154 Erosive (osteo)arthritis: Secondary | ICD-10-CM | POA: Diagnosis not present

## 2020-06-03 DIAGNOSIS — Z79899 Other long term (current) drug therapy: Secondary | ICD-10-CM | POA: Diagnosis not present

## 2020-06-04 DIAGNOSIS — N3941 Urge incontinence: Secondary | ICD-10-CM | POA: Diagnosis not present

## 2020-06-05 ENCOUNTER — Other Ambulatory Visit: Payer: Self-pay | Admitting: Physician Assistant

## 2020-06-05 DIAGNOSIS — M8580 Other specified disorders of bone density and structure, unspecified site: Secondary | ICD-10-CM

## 2020-07-16 ENCOUNTER — Encounter: Payer: Self-pay | Admitting: Internal Medicine

## 2020-07-16 ENCOUNTER — Other Ambulatory Visit: Payer: Self-pay | Admitting: Internal Medicine

## 2020-07-16 MED ORDER — LEVOTHYROXINE SODIUM 25 MCG PO TABS: 25.0000 ug | ORAL_TABLET | Freq: Every day | ORAL | 0 refills | Status: DC

## 2020-07-16 MED ORDER — TRAZODONE HCL 50 MG PO TABS: 50.0000 mg | ORAL_TABLET | Freq: Every evening | ORAL | 1 refills | Status: DC | PRN

## 2020-07-16 NOTE — Telephone Encounter (Signed)
Ok this is done 

## 2020-07-21 ENCOUNTER — Ambulatory Visit (INDEPENDENT_AMBULATORY_CARE_PROVIDER_SITE_OTHER): Payer: Medicare Other | Admitting: Nurse Practitioner

## 2020-07-21 ENCOUNTER — Encounter: Payer: Self-pay | Admitting: Nurse Practitioner

## 2020-07-21 ENCOUNTER — Other Ambulatory Visit: Payer: Self-pay

## 2020-07-21 VITALS — BP 124/78 | Ht 63.0 in | Wt 134.0 lb

## 2020-07-21 DIAGNOSIS — Z01419 Encounter for gynecological examination (general) (routine) without abnormal findings: Secondary | ICD-10-CM

## 2020-07-21 DIAGNOSIS — Z90710 Acquired absence of both cervix and uterus: Secondary | ICD-10-CM

## 2020-07-21 DIAGNOSIS — N8110 Cystocele, unspecified: Secondary | ICD-10-CM

## 2020-07-21 NOTE — Progress Notes (Signed)
   GALE KLAR October 03, 1941 [id number]   History:  79 y.o. [id number] presents for breast and pelvic exam without GYN complaints. Hysterectomy, no HRT. Osteopenia, hypothyroidism, CHF, CKD managed by PCP.   Gynecologic History No LMP recorded. Patient has had a hysterectomy.   Last Pap: no longer screening per guidelines Last mammogram: 05/25/2020. Results were: normal Last colonoscopy: 11/2016. Results were: normal Last Dexa: 02/14/2017. Results were: t-score -1.6, FRAX 19.7% / 3.7%  Past medical history, past surgical history, family history and social history were all reviewed and documented in the EPIC chart.  ROS:  A ROS was performed and pertinent positives and negatives are included.  Exam:  Vitals:   07/21/20 1056  BP: 124/78  Weight: 134 lb (60.8 kg)  Height: 5\' 3"  (1.6 m)   Body mass index is 23.74 kg/m.  General appearance:  Normal Thyroid:  Symmetrical, normal in size, without palpable masses or nodularity. Respiratory  Auscultation:  Clear without wheezing or rhonchi Cardiovascular  Auscultation:  Regular rate, without rubs, murmurs or gallops  Edema/varicosities:  Not grossly evident Abdominal  Soft,nontender, without masses, guarding or rebound.  Liver/spleen:  No organomegaly noted  Hernia:  None appreciated  Skin  Inspection:  Grossly normal   Breasts: Examined lying and sitting.   Right: Without masses, retractions, discharge or axillary adenopathy.   Left: Without masses, retractions, discharge or axillary adenopathy. Gentitourinary   Inguinal/mons:  Normal without inguinal adenopathy  External genitalia:  Normal  BUS/Urethra/Skene's glands:  Normal  Vagina:  Mild cystocele  Cervix:  Absent  Uterus:  Absent  Adnexa/parametria:     Rt: Without masses or tenderness.   Lt: Without masses or tenderness.  Anus and perineum: Normal  Digital rectal exam: Normal sphincter tone without palpated masses or tenderness  Assessment/Plan:  79 y.o. [id number] for  annual exam.   Well female exam with routine gynecological exam - Education provided on SBEs, importance of preventative screenings, current guidelines, high calcium diet, regular exercise, and multivitamin daily.   History of hysterectomy - no HRT  Cystocele - asymptomatic, stable on exam  Follow up in 2 years      [place], 11:04 AM 07/21/2020

## 2020-07-21 NOTE — Patient Instructions (Signed)
Health Maintenance After Age 81 After age 81, you are at a higher risk for certain long-term diseases and infections as well as injuries from falls. Falls are a major cause of broken bones and head injuries in people who are older than age 81. Getting regular preventive care can help to keep you healthy and well. Preventive care includes getting regular testing and making lifestyle changes as recommended by your health care provider. Talk with your health care provider about:  Which screenings and tests you should have. A screening is a test that checks for a disease when you have no symptoms.  A diet and exercise plan that is right for you. What should I know about screenings and tests to prevent falls? Screening and testing are the best ways to find a health problem early. Early diagnosis and treatment give you the best chance of managing medical conditions that are common after age 81. Certain conditions and lifestyle choices may make you more likely to have a fall. Your health care provider may recommend:  Regular vision checks. Poor vision and conditions such as cataracts can make you more likely to have a fall. If you wear glasses, make sure to get your prescription updated if your vision changes.  Medicine review. Work with your health care provider to regularly review all of the medicines you are taking, including over-the-counter medicines. Ask your health care provider about any side effects that may make you more likely to have a fall. Tell your health care provider if any medicines that you take make you feel dizzy or sleepy.  Osteoporosis screening. Osteoporosis is a condition that causes the bones to get weaker. This can make the bones weak and cause them to break more easily.  Blood pressure screening. Blood pressure changes and medicines to control blood pressure can make you feel dizzy.  Strength and balance checks. Your health care provider may recommend certain tests to check your  strength and balance while standing, walking, or changing positions.  Foot health exam. Foot pain and numbness, as well as not wearing proper footwear, can make you more likely to have a fall.  Depression screening. You may be more likely to have a fall if you have a fear of falling, feel emotionally low, or feel unable to do activities that you used to do.  Alcohol use screening. Using too much alcohol can affect your balance and may make you more likely to have a fall. What actions can I take to lower my risk of falls? General instructions  Talk with your health care provider about your risks for falling. Tell your health care provider if: ? You fall. Be sure to tell your health care provider about all falls, even ones that seem minor. ? You feel dizzy, sleepy, or off-balance.  Take over-the-counter and prescription medicines only as told by your health care provider. These include any supplements.  Eat a healthy diet and maintain a healthy weight. A healthy diet includes low-fat dairy products, low-fat (lean) meats, and fiber from whole grains, beans, and lots of fruits and vegetables. Home safety  Remove any tripping hazards, such as rugs, cords, and clutter.  Install safety equipment such as grab bars in bathrooms and safety rails on stairs.  Keep rooms and walkways well-lit. Activity   Follow a regular exercise program to stay fit. This will help you maintain your balance. Ask your health care provider what types of exercise are appropriate for you.  If you need a cane or   walker, use it as recommended by your health care provider.  Wear supportive shoes that have nonskid soles. Lifestyle  Do not drink alcohol if your health care provider tells you not to drink.  If you drink alcohol, limit how much you have: ? 0-1 drink a day for women. ? 0-2 drinks a day for men.  Be aware of how much alcohol is in your drink. In the [place] one drink equals one typical bottle of beer (12  oz), one-half glass of wine (5 oz), or one shot of hard liquor (1 oz).  Do not use any products that contain nicotine or tobacco, such as cigarettes and e-cigarettes. If you need help quitting, ask your health care provider. Summary  Having a healthy lifestyle and getting preventive care can help to protect your health and wellness after age 81.  Screening and testing are the best way to find a health problem early and help you avoid having a fall. Early diagnosis and treatment give you the best chance for managing medical conditions that are more common for people who are older than age 81.  Falls are a major cause of broken bones and head injuries in people who are older than age 81. Take precautions to prevent a fall at home.  Work with your health care provider to learn what changes you can make to improve your health and wellness and to prevent falls. This information is not intended to replace advice given to you by your health care provider. Make sure you discuss any questions you have with your health care provider. Document Revised: 03/14/2019 Document Reviewed: 10/04/2017 Elsevier Patient Education  2020 [employer]  

## 2020-07-22 DIAGNOSIS — G8929 Other chronic pain: Secondary | ICD-10-CM | POA: Diagnosis not present

## 2020-07-22 DIAGNOSIS — M25512 Pain in left shoulder: Secondary | ICD-10-CM | POA: Diagnosis not present

## 2020-07-22 DIAGNOSIS — M7502 Adhesive capsulitis of left shoulder: Secondary | ICD-10-CM | POA: Diagnosis not present

## 2020-07-30 ENCOUNTER — Other Ambulatory Visit: Payer: Self-pay | Admitting: Cardiology

## 2020-07-30 MED ORDER — METOPROLOL SUCCINATE ER 100 MG PO TB24: 100.0000 mg | ORAL_TABLET | Freq: Every day | ORAL | 1 refills | Status: DC

## 2020-08-03 DIAGNOSIS — M7582 Other shoulder lesions, left shoulder: Secondary | ICD-10-CM | POA: Diagnosis not present

## 2020-08-03 DIAGNOSIS — M19012 Primary osteoarthritis, left shoulder: Secondary | ICD-10-CM | POA: Diagnosis not present

## 2020-08-03 DIAGNOSIS — M75122 Complete rotator cuff tear or rupture of left shoulder, not specified as traumatic: Secondary | ICD-10-CM | POA: Diagnosis not present

## 2020-08-03 DIAGNOSIS — M25512 Pain in left shoulder: Secondary | ICD-10-CM | POA: Diagnosis not present

## 2020-08-03 DIAGNOSIS — M6258 Muscle wasting and atrophy, not elsewhere classified, other site: Secondary | ICD-10-CM | POA: Diagnosis not present

## 2020-08-04 DIAGNOSIS — N1832 Chronic kidney disease, stage 3b: Secondary | ICD-10-CM | POA: Diagnosis not present

## 2020-08-04 DIAGNOSIS — I509 Heart failure, unspecified: Secondary | ICD-10-CM | POA: Diagnosis not present

## 2020-08-04 DIAGNOSIS — I129 Hypertensive chronic kidney disease with stage 1 through stage 4 chronic kidney disease, or unspecified chronic kidney disease: Secondary | ICD-10-CM | POA: Diagnosis not present

## 2020-08-04 DIAGNOSIS — D631 Anemia in chronic kidney disease: Secondary | ICD-10-CM | POA: Diagnosis not present

## 2020-08-04 DIAGNOSIS — N189 Chronic kidney disease, unspecified: Secondary | ICD-10-CM | POA: Diagnosis not present

## 2020-08-07 ENCOUNTER — Ambulatory Visit: Payer: Medicare Other | Admitting: Student

## 2020-08-12 ENCOUNTER — Other Ambulatory Visit: Payer: Self-pay

## 2020-08-12 ENCOUNTER — Encounter: Payer: Self-pay | Admitting: Internal Medicine

## 2020-08-12 DIAGNOSIS — K2100 Gastro-esophageal reflux disease with esophagitis, without bleeding: Secondary | ICD-10-CM

## 2020-08-12 MED ORDER — OMEPRAZOLE 40 MG PO CPDR: 40.0000 mg | DELAYED_RELEASE_CAPSULE | Freq: Every day | ORAL | 3 refills | Status: DC

## 2020-08-13 ENCOUNTER — Ambulatory Visit: Payer: Self-pay

## 2020-08-17 ENCOUNTER — Other Ambulatory Visit: Payer: Self-pay

## 2020-08-17 ENCOUNTER — Ambulatory Visit (INDEPENDENT_AMBULATORY_CARE_PROVIDER_SITE_OTHER): Payer: Medicare Other

## 2020-08-17 VITALS — BP 128/80 | HR 63 | Temp 97.8°F | Resp 16 | Ht 64.0 in | Wt 130.4 lb

## 2020-08-17 DIAGNOSIS — Z23 Encounter for immunization: Secondary | ICD-10-CM

## 2020-08-17 DIAGNOSIS — Z0000 Encounter for general adult medical examination without abnormal findings: Secondary | ICD-10-CM | POA: Diagnosis not present

## 2020-08-17 NOTE — Patient Instructions (Addendum)
[name] , Thank you for taking time to come for your Medicare Wellness Visit. I appreciate your ongoing commitment to your health goals. Please review the following plan we discussed and let me know if I can assist you in the future.   Screening recommendations/referrals:   Colonoscopy: no repeat due to age  79: 05/25/2020 Bone Density: 02/13/2017; scheduled for September 2021 Recommended yearly ophthalmology/optometry visit for glaucoma screening and checkup Recommended yearly dental visit for hygiene and checkup  Vaccinations: Influenza vaccine: 08/17/2020 Pneumococcal vaccine: completed Tdap vaccine: overdue Shingles vaccine: completed   Covid-19: completed  Advanced directives: Please bring a copy of your health care power of attorney and living will to the office at your convenience.  Conditions/risks identified: Yes. Reviewed health maintenance screenings with patient today and relevant education, vaccines, and/or referrals were provided. Continue doing brain stimulating activities (puzzles, reading, adult coloring books, staying active) to keep memory sharp. Continue to eat heart healthy diet (full of fruits, vegetables, whole grains, lean protein, water--limit salt, fat, and sugar intake) and increase physical activity as tolerated.  Next appointment: Please schedule your next Medicare Wellness Visit with your Nurse Health Advisor in 1 year.  Preventive Care 31 Years and Older, Female Preventive care refers to lifestyle choices and visits with your health care provider that can promote health and wellness. What does preventive care include?  A yearly physical exam. This is also called an annual well check.  Dental exams once or twice a year.  Routine eye exams. Ask your health care provider how often you should have your eyes checked.  Personal lifestyle choices, including:  Daily care of your teeth and gums.  Regular physical activity.  Eating a healthy  diet.  Avoiding tobacco and drug use.  Limiting alcohol use.  Practicing safe sex.  Taking low-dose aspirin every day.  Taking vitamin and mineral supplements as recommended by your health care provider. What happens during an annual well check? The services and screenings done by your health care provider during your annual well check will depend on your age, overall health, lifestyle risk factors, and family history of disease. Counseling  Your health care provider may ask you questions about your:  Alcohol use.  Tobacco use.  Drug use.  Emotional well-being.  Home and relationship well-being.  Sexual activity.  Eating habits.  History of falls.  Memory and ability to understand (cognition).  Work and work Statistician.  Reproductive health. Screening  You may have the following tests or measurements:  Height, weight, and BMI.  Blood pressure.  Lipid and cholesterol levels. These may be checked every 5 years, or more frequently if you are over 10 years old.  Skin check.  Lung cancer screening. You may have this screening every year starting at age 51 if you have a 30-pack-year history of smoking and currently smoke or have quit within the past 15 years.  Fecal occult blood test (FOBT) of the stool. You may have this test every year starting at age 87.  Flexible sigmoidoscopy or colonoscopy. You may have a sigmoidoscopy every 5 years or a colonoscopy every 10 years starting at age 43.  Hepatitis C blood test.  Hepatitis B blood test.  Sexually transmitted disease (STD) testing.  Diabetes screening. This is done by checking your blood sugar (glucose) after you have not eaten for a while (fasting). You may have this done every 1-3 years.  Bone density scan. This is done to screen for osteoporosis. You may have this done  starting at age 13.  Mammogram. This may be done every 1-2 years. Talk to your health care provider about how often you should have  regular mammograms. Talk with your health care provider about your test results, treatment options, and if necessary, the need for more tests. Vaccines  Your health care provider may recommend certain vaccines, such as:  Influenza vaccine. This is recommended every year.  Tetanus, diphtheria, and acellular pertussis (Tdap, Td) vaccine. You may need a Td booster every 10 years.  Zoster vaccine. You may need this after age 66.  Pneumococcal 13-valent conjugate (PCV13) vaccine. One dose is recommended after age 58.  Pneumococcal polysaccharide (PPSV23) vaccine. One dose is recommended after age 58. Talk to your health care provider about which screenings and vaccines you need and how often you need them. This information is not intended to replace advice given to you by your health care provider. Make sure you discuss any questions you have with your health care provider. Document Released: 12/18/2015 Document Revised: 08/10/2016 Document Reviewed: 09/22/2015 Elsevier Interactive Patient Education  2017 [place] Prevention in the Home Falls can cause injuries. They can happen to people of all ages. There are many things you can do to make your home safe and to help prevent falls. What can I do on the outside of my home?  Regularly fix the edges of walkways and driveways and fix any cracks.  Remove anything that might make you trip as you walk through a door, such as a raised step or threshold.  Trim any bushes or trees on the path to your home.  Use bright outdoor lighting.  Clear any walking paths of anything that might make someone trip, such as rocks or tools.  Regularly check to see if handrails are loose or broken. Make sure that both sides of any steps have handrails.  Any raised decks and porches should have guardrails on the edges.  Have any leaves, snow, or ice cleared regularly.  Use sand or salt on walking paths during winter.  Clean up any spills in your  garage right away. This includes oil or grease spills. What can I do in the bathroom?  Use night lights.  Install grab bars by the toilet and in the tub and shower. Do not use towel bars as grab bars.  Use non-skid mats or decals in the tub or shower.  If you need to sit down in the shower, use a plastic, non-slip stool.  Keep the floor dry. Clean up any water that spills on the floor as soon as it happens.  Remove soap buildup in the tub or shower regularly.  Attach bath mats securely with double-sided non-slip rug tape.  Do not have throw rugs and other things on the floor that can make you trip. What can I do in the bedroom?  Use night lights.  Make sure that you have a light by your bed that is easy to reach.  Do not use any sheets or blankets that are too big for your bed. They should not hang down onto the floor.  Have a firm chair that has side arms. You can use this for support while you get dressed.  Do not have throw rugs and other things on the floor that can make you trip. What can I do in the kitchen?  Clean up any spills right away.  Avoid walking on wet floors.  Keep items that you use a lot in easy-to-reach places.  If you need to reach something above you, use a strong step stool that has a grab bar.  Keep electrical cords out of the way.  Do not use floor polish or wax that makes floors slippery. If you must use wax, use non-skid floor wax.  Do not have throw rugs and other things on the floor that can make you trip. What can I do with my stairs?  Do not leave any items on the stairs.  Make sure that there are handrails on both sides of the stairs and use them. Fix handrails that are broken or loose. Make sure that handrails are as long as the stairways.  Check any carpeting to make sure that it is firmly attached to the stairs. Fix any carpet that is loose or worn.  Avoid having throw rugs at the top or bottom of the stairs. If you do have throw  rugs, attach them to the floor with carpet tape.  Make sure that you have a light switch at the top of the stairs and the bottom of the stairs. If you do not have them, ask someone to add them for you. What else can I do to help prevent falls?  Wear shoes that:  Do not have high heels.  Have rubber bottoms.  Are comfortable and fit you well.  Are closed at the toe. Do not wear sandals.  If you use a stepladder:  Make sure that it is fully opened. Do not climb a closed stepladder.  Make sure that both sides of the stepladder are locked into place.  Ask someone to hold it for you, if possible.  Clearly mark and make sure that you can see:  Any grab bars or handrails.  First and last steps.  Where the edge of each step is.  Use tools that help you move around (mobility aids) if they are needed. These include:  Canes.  Walkers.  Scooters.  Crutches.  Turn on the lights when you go into a dark area. Replace any light bulbs as soon as they burn out.  Set up your furniture so you have a clear path. Avoid moving your furniture around.  If any of your floors are uneven, fix them.  If there are any pets around you, be aware of where they are.  Review your medicines with your doctor. Some medicines can make you feel dizzy. This can increase your chance of falling. Ask your doctor what other things that you can do to help prevent falls. This information is not intended to replace advice given to you by your health care provider. Make sure you discuss any questions you have with your health care provider. Document Released: 09/17/2009 Document Revised: 04/28/2016 Document Reviewed: 12/26/2014 Elsevier Interactive Patient Education  2017 [employer].

## 2020-08-17 NOTE — Progress Notes (Signed)
Subjective:   [name] is a 79 y.o. female who presents for Medicare Annual (Subsequent) preventive examination.  Review of Systems    No ROS. Medicare Wellness Visit Cardiac Risk Factors include: advanced age (>66men, >2 women);dyslipidemia;family history of premature cardiovascular disease;hypertension     Objective:    Today's Vitals   08/17/20 1111  BP: 128/80  Pulse: 63  Resp: 16  Temp: 97.8 F (36.6 C)  SpO2: 95%  Weight: 130 lb 6.4 oz (59.1 kg)  Height: 5\' 4"  (1.626 m)  PainSc: 9    Body mass index is 22.38 kg/m.  Advanced Directives 08/17/2020 08/08/2019 07/27/2018 12/20/2016 10/11/2016 09/26/2016 08/23/2016  Does Patient Have a Medical Advance Directive? Yes Yes Yes No No No No  Type of Paramedic of [place];Living will [place];Living will - - - -  Does patient want to make changes to medical advance directive? No - Patient declined - - - - - -  Copy of [place] in Chart? No - copy requested No - copy requested No - copy requested - - - -  Would patient like information on creating a medical advance directive? - - - No - Patient declined - Yes - Educational materials given Yes - Scientist, clinical (histocompatibility and immunogenetics) given  Pre-existing out of facility DNR order (yellow form or pink MOST form) - - - - - - -    Current Medications (verified) Outpatient Encounter Medications as of 08/17/2020  Medication Sig  . albuterol (VENTOLIN HFA) 108 (90 Base) MCG/ACT inhaler Inhale 1-2 puffs into the lungs every 6 (six) hours as needed for wheezing or shortness of breath.  Marland Kitchen apixaban (ELIQUIS) 5 MG TABS tablet Take 1 tablet (5 mg total) by mouth 2 (two) times daily.  Marland Kitchen azelastine (ASTELIN) 0.1 % nasal spray Place 2 sprays into the nose daily. Use in each nostril as directed  . buPROPion (WELLBUTRIN XL) 300 MG 24 hr tablet Take 1 tablet (300 mg total) by mouth daily.  . Calcium Carbonate-Vitamin D  (CALCIUM + D PO) Take 1 tablet by mouth 2 (two) times daily.   . colestipol (COLESTID) 1 g tablet Take 2 tablets (2 g total) by mouth 2 (two) times daily.  . cyclobenzaprine (FLEXERIL) 5 MG tablet Take 1 tablet (5 mg total) by mouth 3 (three) times daily as needed for muscle spasms.  Marland Kitchen ezetimibe (ZETIA) 10 MG tablet Take 1 tablet (10 mg total) by mouth daily.  . fluticasone (FLONASE) 50 MCG/ACT nasal spray Place 1 spray into both nostrils 2 (two) times daily.  . Fluticasone-Umeclidin-Vilant (TRELEGY ELLIPTA) 100-62.5-25 MCG/INH AEPB Inhale 1 puff into the lungs daily. I inhalation once daily  . furosemide (LASIX) 20 MG tablet Take 1 tablet (20 mg total) by mouth daily as needed (swelling).  . gabapentin (NEURONTIN) 300 MG capsule 1 tab by mouth in AM, 1 po at midday, then 2 by mouth at bedtime for sleep and pain  . guaiFENesin (MUCINEX) 600 MG 12 hr tablet Take 1,200 mg by mouth 2 (two) times daily as needed for cough or to loosen phlegm.  . hydroxychloroquine (PLAQUENIL) 200 MG tablet Take 400 mg by mouth at bedtime.  Marland Kitchen ipratropium (ATROVENT) 0.03 % nasal spray Place 2 sprays into both nostrils 3 (three) times daily as needed for rhinitis.  Marland Kitchen levothyroxine (SYNTHROID) 25 MCG tablet Take 1 tablet (25 mcg total) by mouth daily before breakfast. Annual appt due in Nov must see  provider for future refills  . Lidocaine HCl 2 % CREA Apply 1 application topically daily as needed (for shingles flares).  Marland Kitchen loperamide (IMODIUM) 2 MG capsule Take by mouth.  . loratadine (CLARITIN) 10 MG tablet Take 10 mg by mouth daily.  . Melatonin 5 MG TABS Take 5 mg by mouth at bedtime.  . metoprolol succinate (TOPROL-XL) 100 MG 24 hr tablet Take 1 tablet (100 mg total) by mouth daily. Take with or immediately following a meal.  . mirabegron ER (MYRBETRIQ) 50 MG TB24 tablet Take 50 mg by mouth daily.  . montelukast (SINGULAIR) 10 MG tablet Take 1 tablet (10 mg total) by mouth daily. Annual appt due in Nov must see  provider for future refills  . Multiple Vitamin (MULTIVITAMIN) tablet Take 1 tablet by mouth daily.    . NYSTATIN PO Take 5 mLs by mouth as needed.  Marland Kitchen omeprazole (PRILOSEC) 40 MG capsule Take 1 capsule (40 mg total) by mouth daily.  . Probiotic Product (PROBIOTIC DAILY PO) Take by mouth.  . traZODone (DESYREL) 50 MG tablet Take 1 tablet (50 mg total) by mouth at bedtime and may repeat dose one time if needed.   No facility-administered encounter medications on file as of 08/17/2020.    Allergies (verified) Clarithromycin, Lipitor [atorvastatin], Simvastatin, Cardizem [diltiazem hcl], Contrast media [iodinated diagnostic agents], Crestor [rosuvastatin calcium], and Rosuvastatin calcium   History: Past Medical History:  Diagnosis Date  . Allergic rhinitis   . Anxiety   . Atrial septal aneurysm   . Basal cell carcinoma   . Bradycardia   . Cataract    bil cataracts removed  . Chronic combined systolic and diastolic CHF (congestive heart failure) ([place])   . CKD (chronic kidney disease), stage III   . Clostridium difficile infection   . COPD (chronic obstructive pulmonary disease) ([place])   . Depression 11/29/2013  . DI (detrusor instability)   . Esophageal stricture   . GERD (gastroesophageal reflux disease)   . Hemorrhoids   . Hiatal hernia   . History of kidney stones   . HLD (hyperlipidemia)   . HTN (hypertension)   . Ischemic colon ([place])    a. 08/2016 - adm to  [facility] with a ruptured colon s/p right hemicolectomy with ileostomy formation for ischemic and necrotic colon.   . Lung nodule    a. followed by pulmonology  . Mild CAD    a. minor nonobstructive CAD 08/22/16.  . Nephrolithiasis    hx  . NICM (nonischemic cardiomyopathy) ([place])   . Orthostatic hypotension   . Osteoarthritis   . PAF (paroxysmal atrial fibrillation) ([place])    a. diagnosed 07/2016 s/p DCCV 08/01/16 with recurrence, managed with rate control for now.   . Pneumonia 2013  . PVC's (premature  ventricular contractions)   . Shingles since Nov 18, 2013   on right arm from shoulder to wrist  . Status post dilation of esophageal narrowing   . Stroke [facility]) 2001   STROKES, TIA'S  . Syncope    a. 08/2016 felt due to orthostatic hypotension.  Marland Kitchen TIA (transient ischemic attack) last 2001   anticoagulation therapy on coumadin   Past Surgical History:  Procedure Laterality Date  . ANTERIOR LATERAL LUMBAR FUSION 4 LEVELS Right 07/03/2015   Procedure: Right Lumbar one-two, Lumbar two-three, Lumbar three-four, Lumbar four-five Anterior lateral lumbar interbody fusion;  Surgeon: Erline Levine, MD;  Location: [place] NEURO ORS;  Service: Neurosurgery;  Laterality: Right;  Right L1-2 L2-3 L3-4 L4-5 Anterior lateral  lumbar interbody fusion  . BALLOON DILATION N/A 03/03/2014   Procedure: BALLOON DILATION;  Surgeon: Inda Castle, MD;  Location: [facility] ENDOSCOPY;  Service: Endoscopy;  Laterality: N/A;  . BLADDER SUSPENSION    . BREAST BIOPSY    . CARDIAC CATHETERIZATION N/A 08/22/2016   Procedure: Left Heart Cath and Coronary Angiography;  Surgeon: Peter M Martinique, MD;  Location: [place] CV LAB;  Service: Cardiovascular;  Laterality: N/A;  . CARDIOVERSION N/A 08/01/2016   Procedure: CARDIOVERSION;  Surgeon: Jerline Pain, MD;  Location: [place];  Service: Cardiovascular;  Laterality: N/A;  . CARDIOVERSION N/A 10/11/2016   Procedure: CARDIOVERSION;  Surgeon: Sueanne Margarita, MD;  Location: [facility] ENDOSCOPY;  Service: Cardiovascular;  Laterality: N/A;  . COLONOSCOPY    . ESOPHAGOGASTRODUODENOSCOPY N/A 03/03/2014   Procedure: ESOPHAGOGASTRODUODENOSCOPY (EGD);  Surgeon: Inda Castle, MD;  Location: [facility];  Service: Endoscopy;  Laterality: N/A;  . EXCISION OF BASAL CELL CA  2011  . FINGER SURGERY    . GLUTEUS MINIMUS REPAIR Right 03/15/2016   Procedure: RIGHT HIP GLUTEUS MEDIUS TENDON REPAIR;  Surgeon: Paralee Cancel, MD;  Location: [facility] ORS;  Service: Orthopedics;  Laterality: Right;  . ileostomy reversal     . LUMBAR PERCUTANEOUS PEDICLE SCREW 4 LEVEL Right 07/03/2015   Procedure: Lumbar one-Sacral one Bilateral percutaneous pedicle screws;  Surgeon: Erline Levine, MD;  Location: [place] NEURO ORS;  Service: Neurosurgery;  Laterality: Right;  L1-S1 Bilateral percutaneous pedicle screws  . RECTOCELE REPAIR  2008   A&P REPAIR with vaginal repair-DR. MCDIARMID  . THUMB SURGERY Right 10-15 yrs ago  . UPPER GASTROINTESTINAL ENDOSCOPY    . VAGINAL HYSTERECTOMY  1983   NO BSO-DR. MABRY  . VARICOSE VEIN SURGERY     Family History  Problem Relation Age of Onset  . COPD Mother   . Breast cancer Mother        mid 26's  . Uterine cancer Mother   . Emphysema Mother   . Breast cancer Maternal Aunt        x 3 aunts, post menopausal  . Breast cancer Maternal Aunt   . Breast cancer Maternal Aunt   . Colon cancer Neg Hx   . Esophageal cancer Neg Hx   . Rectal cancer Neg Hx   . Stomach cancer Neg Hx   . Pancreatic cancer Neg Hx    Social History   Socioeconomic History  . Marital status: Married    Spouse name: Shanon Brow  . Number of children: 2  . Years of education: Not on file  . Highest education level: Not on file  Occupational History  . Occupation: retired    Fish farm manager: RETIRED  Tobacco Use  . Smoking status: Former Smoker    Packs/day: 3.00    Years: 42.00    Pack years: 126.00    Types: Cigarettes    Start date: 08/04/1954    Quit date: 12/06/1995    Years since quitting: 24.7  . Smokeless tobacco: Never Used  Vaping Use  . Vaping Use: Never used  Substance and Sexual Activity  . Alcohol use: No    Alcohol/week: 0.0 standard drinks  . Drug use: No  . Sexual activity: Not Currently    Birth control/protection: Surgical    Comment: 1st intercourse 79 yo-Fewer than 5 partners  Other Topics Concern  . Not on file  Social History Narrative   Retired- Dentist Pulmonary:   Originally from [place]. Has  always lived in [place]. She has traveled to [place],  [place], [place], [place], [place], [place]. Has been on multiple cruises. 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 She did use a face mask consistently to avoid chemical fume exposure. She may have only had an inhaled exposure a couple of times. She currently has a dog. Remote exposure to a parakeet. No mold or hot tub exposure. No asbestos exposure.    Social Determinants of Health   Financial Resource Strain: Low Risk   . Difficulty of Paying Living Expenses: Not hard at all  Food Insecurity: No Food Insecurity  . Worried About Charity fundraiser in the Last Year: Never true  . Ran Out of Food in the Last Year: Never true  Transportation Needs: No Transportation Needs  . Lack of Transportation (Medical): No  . Lack of Transportation (Non-Medical): No  Physical Activity: Sufficiently Active  . Days of Exercise per Week: 5 days  . Minutes of Exercise per Session: 30 min  Stress: No Stress Concern Present  . Feeling of Stress : Not at all  Social Connections: Socially Integrated  . Frequency of Communication with Friends and Family: More than three times a week  . Frequency of Social Gatherings with Friends and Family: More than three times a week  . Attends Religious Services: More than 4 times per year  . Active Member of Clubs or Organizations: Yes  . Attends Archivist Meetings: More than 4 times per year  . Marital Status: Married    Tobacco Counseling Counseling given: Not Answered   Clinical Intake:  Pre-visit preparation completed: Yes  Pain : 0-10 Pain Score: 9  Pain Type: Chronic pain Pain Location: Shoulder Pain Orientation: Left Pain Radiating Towards: left arm Pain Descriptors / Indicators: Aching, Discomfort, Throbbing Pain Onset: More than a month ago Pain Frequency: Intermittent Pain Relieving Factors: Tylenol Effect of Pain on Daily Activities: Not able to do housework or  lift  Pain Relieving Factors: Tylenol  BMI - recorded: 22.38 Nutritional Status: BMI of 19-24  Normal Nutritional Risks: None Diabetes: No  How often do you need to have someone help you when you read instructions, pamphlets, or other written materials from your doctor or pharmacy?: 1 - Never What is the last grade level you completed in school?: HSG  Diabetic? no  Interpreter Needed?: No  Information entered by :: Lisette Abu, LPN   Activities of Daily Living In your present state of health, do you have any difficulty performing the following activities: 08/17/2020  Hearing? N  Vision? N  Difficulty concentrating or making decisions? N  Walking or climbing stairs? N  Dressing or bathing? N  Doing errands, shopping? N  Preparing Food and eating ? N  Using the Toilet? N  In the past six months, have you accidently leaked urine? N  Do you have problems with loss of bowel control? N  Managing your Medications? N  Managing your Finances? N  Housekeeping or managing your Housekeeping? N  Some recent data might be hidden    Patient Care Team: Biagio Borg, MD as PCP - General Curt Bears, Ocie Doyne, MD as PCP - Electrophysiology (Cardiology) Irene Shipper, MD as Consulting Physician (Gastroenterology) Governor Rooks., DO as Referring Physician (Surgery) Fontaine, Belinda Block, MD (Inactive) as Consulting Physician (Gynecology) Lynnell Dike, OD (Optometry)  Indicate any recent Medical Services you may have received from other than [facility]  providers in the past year (date may be approximate).     Assessment:   This is a routine wellness examination for Hanna.  Hearing/Vision screen No exam data present  Dietary issues and exercise activities discussed: Current Exercise Habits: Home exercise routine, Type of exercise: walking, Time (Minutes): 30, Frequency (Times/Week): 5, Weekly Exercise (Minutes/Week): 150, Intensity: Mild, Exercise limited by: respiratory  conditions(s);orthopedic condition(s)  Goals    .  <enter goal here> (pt-stated)      "to get well again"    .  Patient Stated      Increase my physical activity by doing exercises to increase strength in my arms and legs.     .  Patient Stated      Stay as healthy and as independent as possible.       Depression Screen PHQ 2/9 Scores 08/17/2020 04/30/2020 04/30/2020 10/29/2019 08/08/2019 04/19/2019 07/27/2018  PHQ - 2 Score 0 0 1 0 1 1 1   PHQ- 9 Score - 0 - - 4 - 9    Fall Risk Fall Risk  08/17/2020 04/30/2020 10/29/2019 08/08/2019 04/19/2019  Falls in the past year? 0 0 0 0 0  Number falls in past yr: 0 - - 0 -  Comment - - - - -  Injury with Fall? 0 - - 0 -  Risk for fall due to : Impaired balance/gait - - Impaired balance/gait;Impaired mobility -  Follow up Falls evaluation completed;Education provided - - Falls prevention discussed -    Any stairs in or around the home? Yes  If so, are there any without handrails? No  Home free of loose throw rugs in walkways, pet beds, electrical cords, etc? Yes  Adequate lighting in your home to reduce risk of falls? Yes   ASSISTIVE DEVICES UTILIZED TO PREVENT FALLS:  Life alert? No  Use of a cane, walker or w/c? Yes  Grab bars in the bathroom? Yes  Shower chair or bench in shower? Yes  Elevated toilet seat or a handicapped toilet? Yes   TIMED UP AND GO:  Was the test performed? No .  Length of time to ambulate 10 feet: 0 sec.   Gait steady and fast with assistive device  Cognitive Function: MMSE - Mini Mental State Exam 07/27/2018  Orientation to time 5  Orientation to Place 5  Registration 3  Attention/ Calculation 5  Recall 2  Language- name 2 objects 2  Language- repeat 1  Language- follow 3 step command 3  Language- read & follow direction 1  Write a sentence 1  Copy design 1  Total score 29     6CIT Screen 08/17/2020 08/08/2019  What Year? 0 points 0 points  What month? 0 points 0 points  What time? 0 points 0 points   Count back from 20 0 points 0 points  Months in reverse 0 points 0 points  Repeat phrase 2 points 2 points  Total Score 2 2    Immunizations Immunization History  Administered Date(s) Administered  . Fluad Quad(high Dose 65+) 08/08/2019  . Influenza Split 09/11/2012  . Influenza Whole 09/15/2006, 08/04/2010, 08/11/2011  . Influenza, High Dose Seasonal PF 08/22/2017, 09/05/2018  . Influenza,inj,Quad PF,6+ Mos 09/02/2013, 08/26/2014, 08/19/2015, 08/23/2016  . PFIZER SARS-COV-2 Vaccination 12/18/2019, 01/08/2020  . Pneumococcal Conjugate-13 11/29/2013  . Pneumococcal Polysaccharide-23 12/06/2003, 12/05/2006  . Td 12/05/2004  . Zoster 07/07/2008  . Zoster Recombinat (Shingrix) 04/12/2017, 06/19/2017    TDAP status: Up to date Flu Vaccine status: Up to date Pneumococcal  vaccine status: Up to date Covid-19 vaccine status: Completed vaccines  Qualifies for Shingles Vaccine? Yes   Zostavax completed Yes   Shingrix Completed?: Yes  Screening Tests Health Maintenance  Topic Date Due  . INFLUENZA VACCINE  07/05/2020  . TETANUS/TDAP  06/04/2026  . DEXA SCAN  Completed  . COVID-19 Vaccine  Completed  . PNA vac Low Risk Adult  Completed  . PAP SMEAR-Modifier  Discontinued    Health Maintenance  Health Maintenance Due  Topic Date Due  . INFLUENZA VACCINE  07/05/2020    Colorectal cancer screening: No longer required.  Mammogram status: Completed 05/25/2020. Repeat every year Bone Density status: Completed 02/13/2017. Results reflect: Bone density results: NORMAL. Repeat every 2 years.  (Per patient scheduled for September 2021)  Lung Cancer Screening: (Low Dose CT Chest recommended if Age 52-80 years, 30 pack-year currently smoking OR have quit w/in 15years.) does not qualify.   Lung Cancer Screening Referral: no  Additional Screening:  Hepatitis C Screening: does not qualify; Completed: no  Vision Screening: Recommended annual ophthalmology exams for early detection of  glaucoma and other disorders of the eye. Is the patient up to date with their annual eye exam?  Yes  Who is the provider or what is the name of the office in which the patient attends annual eye exams? Cleon Gustin, MD If pt is not established with a provider, would they like to be referred to a provider to establish care? No .   Dental Screening: Recommended annual dental exams for proper oral hygiene  Community Resource Referral / Chronic Care Management: CRR required this visit?  No   CCM required this visit?  No      Plan:     I have personally reviewed and noted the following in the patient's chart:   . Medical and social history . Use of alcohol, tobacco or illicit drugs  . Current medications and supplements . Functional ability and status . Nutritional status . Physical activity . Advanced directives . List of other physicians . Hospitalizations, surgeries, and ER visits in previous 12 months . Vitals . Screenings to include cognitive, depression, and falls . Referrals and appointments  In addition, I have reviewed and discussed with patient certain preventive protocols, quality metrics, and best practice recommendations. A written personalized care plan for preventive services as well as general preventive health recommendations were provided to patient.     Sheral Flow, LPN   [id number]   Nurse Notes: n/a

## 2020-08-19 DIAGNOSIS — M7502 Adhesive capsulitis of left shoulder: Secondary | ICD-10-CM | POA: Diagnosis not present

## 2020-08-19 DIAGNOSIS — G8929 Other chronic pain: Secondary | ICD-10-CM | POA: Diagnosis not present

## 2020-08-19 DIAGNOSIS — M25512 Pain in left shoulder: Secondary | ICD-10-CM | POA: Diagnosis not present

## 2020-08-19 DIAGNOSIS — M75122 Complete rotator cuff tear or rupture of left shoulder, not specified as traumatic: Secondary | ICD-10-CM | POA: Diagnosis not present

## 2020-08-19 DIAGNOSIS — M19012 Primary osteoarthritis, left shoulder: Secondary | ICD-10-CM | POA: Diagnosis not present

## 2020-08-25 DIAGNOSIS — H43393 Other vitreous opacities, bilateral: Secondary | ICD-10-CM | POA: Diagnosis not present

## 2020-08-25 DIAGNOSIS — H01009 Unspecified blepharitis unspecified eye, unspecified eyelid: Secondary | ICD-10-CM | POA: Diagnosis not present

## 2020-08-25 DIAGNOSIS — Z961 Presence of intraocular lens: Secondary | ICD-10-CM | POA: Diagnosis not present

## 2020-08-25 DIAGNOSIS — H524 Presbyopia: Secondary | ICD-10-CM | POA: Diagnosis not present

## 2020-08-31 ENCOUNTER — Telehealth: Payer: Self-pay | Admitting: *Deleted

## 2020-08-31 NOTE — Telephone Encounter (Signed)
   [facility] Pre-operative Risk Assessment    HEARTCARE STAFF: - Please ensure there is not already an duplicate clearance open for this procedure. - Under Visit Info/Reason for Call, type in Other and utilize the format Clearance MM/DD/YY or Clearance TBD. Do not use dashes or single digits. - If request is for dental extraction, please clarify the # of teeth to be extracted.  Request for surgical clearance:  1. What type of surgery is being performed? LEFT REVERSE TOTAL SHOULDER REPLACEMENT    2. When is this surgery scheduled? TBD   3. What type of clearance is required (medical clearance vs. Pharmacy clearance to hold med vs. Both)? BOTH  4. Are there any medications that need to be held prior to surgery and how long? ELIQUIS   5. Practice name and name of physician performing surgery? [facility]; DR. JEFFREY YASTE   6. What is the office phone number? [phone]   7.   What is the office fax number? [facility].   Anesthesia type (None, local, MAC, general) ? GENERAL   [name] 08/31/2020, 1:53 PM  _________________________________________________________________   (provider comments below)

## 2020-09-01 ENCOUNTER — Ambulatory Visit
Admission: RE | Admit: 2020-09-01 | Discharge: 2020-09-01 | Disposition: A | Discharge status: Home / Self Care | Payer: Medicare Other | Source: Ambulatory Visit | Attending: Physician Assistant | Admitting: Physician Assistant

## 2020-09-01 ENCOUNTER — Other Ambulatory Visit: Payer: Self-pay

## 2020-09-01 DIAGNOSIS — M810 Age-related osteoporosis without current pathological fracture: Secondary | ICD-10-CM | POA: Diagnosis not present

## 2020-09-01 DIAGNOSIS — Z780 Asymptomatic menopausal state: Secondary | ICD-10-CM | POA: Diagnosis not present

## 2020-09-01 DIAGNOSIS — M8580 Other specified disorders of bone density and structure, unspecified site: Secondary | ICD-10-CM

## 2020-09-01 NOTE — Telephone Encounter (Signed)
Clinical pharmacist to review Eliquis 

## 2020-09-01 NOTE — Telephone Encounter (Addendum)
Patient with diagnosis of A Fib on Eliquis for anticoagulation.    Procedure: LEFT REVERSE TOTAL SHOULDER REPLACEMENT   Date of procedure: TBD  CHADS2-VASc score of  8 (CHF, HTN, AGE x 2, stroke/tia x 2, CAD, female)  CrCl  41mL/min Platelet count 192K  Per protocol, prefer patient to hold 3 days of Eliquis for orthopedic prcedures.  However, due to patients elevated risk factors and prior stroke and TIA history, patient will be very high risk off anticoagulation.  Will route to MD for input.  Of note, patient is on incorrect dose of Eliquis for age and weight.  Patient should be on Eliquis 2.5mg  BID.

## 2020-09-01 NOTE — Telephone Encounter (Signed)
At this point, agree with holding Eliquis for orthopedic procedures as below.  Patient to restart as soon as possible postop.

## 2020-09-01 NOTE — Telephone Encounter (Signed)
[name] will you change patient's Eliquis prescription tomorrow when you see her?

## 2020-09-01 NOTE — Telephone Encounter (Signed)
Patient has appt with Joesph July of EP tomorrow, preop clearance can be addressed at that time. Callback pool to include preop clearance to the reason behind follow up.

## 2020-09-02 ENCOUNTER — Ambulatory Visit (INDEPENDENT_AMBULATORY_CARE_PROVIDER_SITE_OTHER): Payer: Medicare Other | Admitting: Student

## 2020-09-02 ENCOUNTER — Encounter: Payer: Self-pay | Admitting: Student

## 2020-09-02 VITALS — BP 130/68 | HR 65 | Ht 64.0 in | Wt 135.0 lb

## 2020-09-02 DIAGNOSIS — I10 Essential (primary) hypertension: Secondary | ICD-10-CM | POA: Diagnosis not present

## 2020-09-02 DIAGNOSIS — I5022 Chronic systolic (congestive) heart failure: Secondary | ICD-10-CM | POA: Diagnosis not present

## 2020-09-02 DIAGNOSIS — I493 Ventricular premature depolarization: Secondary | ICD-10-CM

## 2020-09-02 DIAGNOSIS — Z8673 Personal history of transient ischemic attack (TIA), and cerebral infarction without residual deficits: Secondary | ICD-10-CM

## 2020-09-02 DIAGNOSIS — I480 Paroxysmal atrial fibrillation: Secondary | ICD-10-CM | POA: Diagnosis not present

## 2020-09-02 NOTE — Progress Notes (Signed)
PCP:  Biagio Borg, MD Primary Cardiologist: No primary care provider on file. Electrophysiologist: Will Meredith Leeds, MD   [name] is a 79 y.o. female seen today for Will Meredith Leeds, MD for cardiac clearance for left shoulder replacement.  Since last being seen in our clinic the patient reports doing very well. She is planned for an upcoming shoulder replacement. She denies SOB. She has not had palpitations or syncope. She did have a fall earlier this week. She tripped letting the dog out at midnight. Denies injury. She does bruise easily on Eliquis.   Past Medical History:  Diagnosis Date  . Allergic rhinitis   . Anxiety   . Atrial septal aneurysm   . Basal cell carcinoma   . Bradycardia   . Cataract    bil cataracts removed  . Chronic combined systolic and diastolic CHF (congestive heart failure) ([place])   . CKD (chronic kidney disease), stage III   . Clostridium difficile infection   . COPD (chronic obstructive pulmonary disease) ([place])   . Depression 11/29/2013  . DI (detrusor instability)   . Esophageal stricture   . GERD (gastroesophageal reflux disease)   . Hemorrhoids   . Hiatal hernia   . History of kidney stones   . HLD (hyperlipidemia)   . HTN (hypertension)   . Ischemic colon ([place])    a. 08/2016 - adm to  [facility] with a ruptured colon s/p right hemicolectomy with ileostomy formation for ischemic and necrotic colon.   . Lung nodule    a. followed by pulmonology  . Mild CAD    a. minor nonobstructive CAD 08/22/16.  . Nephrolithiasis    hx  . NICM (nonischemic cardiomyopathy) ([place])   . Orthostatic hypotension   . Osteoarthritis   . PAF (paroxysmal atrial fibrillation) ([place])    a. diagnosed 07/2016 s/p DCCV 08/01/16 with recurrence, managed with rate control for now.   . Pneumonia 2013  . PVC's (premature ventricular contractions)   . Shingles since Nov 18, 2013   on right arm from shoulder to wrist  . Status post dilation of esophageal  narrowing   . Stroke [facility]) 2001   STROKES, TIA'S  . Syncope    a. 08/2016 felt due to orthostatic hypotension.  Marland Kitchen TIA (transient ischemic attack) last 2001   anticoagulation therapy on coumadin   Past Surgical History:  Procedure Laterality Date  . ANTERIOR LATERAL LUMBAR FUSION 4 LEVELS Right 07/03/2015   Procedure: Right Lumbar one-two, Lumbar two-three, Lumbar three-four, Lumbar four-five Anterior lateral lumbar interbody fusion;  Surgeon: Erline Levine, MD;  Location: [place] [facility];  Service: Neurosurgery;  Laterality: Right;  Right L1-2 L2-3 L3-4 L4-5 Anterior lateral lumbar interbody fusion  . BALLOON DILATION N/A 03/03/2014   Procedure: BALLOON DILATION;  Surgeon: Inda Castle, MD;  Location: [facility];  Service: Endoscopy;  Laterality: N/A;  . BLADDER SUSPENSION    . BREAST BIOPSY    . CARDIAC CATHETERIZATION N/A 08/22/2016   Procedure: Left Heart Cath and Coronary Angiography;  Surgeon: Peter M Martinique, MD;  Location: [place] CV LAB;  Service: Cardiovascular;  Laterality: N/A;  . CARDIOVERSION N/A 08/01/2016   Procedure: CARDIOVERSION;  Surgeon: Jerline Pain, MD;  Location: [place];  Service: Cardiovascular;  Laterality: N/A;  . CARDIOVERSION N/A 10/11/2016   Procedure: CARDIOVERSION;  Surgeon: Sueanne Margarita, MD;  Location: [facility];  Service: Cardiovascular;  Laterality: N/A;  . COLONOSCOPY    . ESOPHAGOGASTRODUODENOSCOPY N/A 03/03/2014  Procedure: ESOPHAGOGASTRODUODENOSCOPY (EGD);  Surgeon: Inda Castle, MD;  Location: [facility];  Service: Endoscopy;  Laterality: N/A;  . EXCISION OF BASAL CELL CA  2011  . FINGER SURGERY    . GLUTEUS MINIMUS REPAIR Right 03/15/2016   Procedure: RIGHT HIP GLUTEUS MEDIUS TENDON REPAIR;  Surgeon: Paralee Cancel, MD;  Location: [facility];  Service: Orthopedics;  Laterality: Right;  . ileostomy reversal    . LUMBAR PERCUTANEOUS PEDICLE SCREW 4 LEVEL Right 07/03/2015   Procedure: Lumbar one-Sacral one Bilateral percutaneous pedicle  screws;  Surgeon: Erline Levine, MD;  Location: [place] [facility];  Service: Neurosurgery;  Laterality: Right;  L1-S1 Bilateral percutaneous pedicle screws  . RECTOCELE REPAIR  2008   A&P REPAIR with vaginal repair-DR. MCDIARMID  . THUMB SURGERY Right 10-15 yrs ago  . UPPER GASTROINTESTINAL ENDOSCOPY    . VAGINAL HYSTERECTOMY  1983   NO BSO-DR. MABRY  . VARICOSE VEIN SURGERY      Current Outpatient Medications  Medication Sig Dispense Refill  . albuterol (VENTOLIN HFA) 108 (90 Base) MCG/ACT inhaler Inhale 1-2 puffs into the lungs every 6 (six) hours as needed for wheezing or shortness of breath. 8 g 6  . apixaban (ELIQUIS) 5 MG TABS tablet Take 1 tablet (5 mg total) by mouth 2 (two) times daily. 180 tablet 3  . azelastine (ASTELIN) 0.1 % nasal spray Place 2 sprays into the nose daily. Use in each nostril as directed 90 mL 0  . buPROPion (WELLBUTRIN XL) 300 MG 24 hr tablet Take 1 tablet (300 mg total) by mouth daily. 90 tablet 3  . Calcium Carbonate-Vitamin D (CALCIUM + D PO) Take 1 tablet by mouth 2 (two) times daily.     . colestipol (COLESTID) 1 g tablet Take 2 tablets (2 g total) by mouth 2 (two) times daily. 360 tablet 3  . cyclobenzaprine (FLEXERIL) 5 MG tablet Take 1 tablet (5 mg total) by mouth 3 (three) times daily as needed for muscle spasms. 270 tablet 1  . ezetimibe (ZETIA) 10 MG tablet Take 1 tablet (10 mg total) by mouth daily. 90 tablet 3  . fluticasone (FLONASE) 50 MCG/ACT nasal spray Place 1 spray into both nostrils 2 (two) times daily. 48 g 1  . Fluticasone-Umeclidin-Vilant (TRELEGY ELLIPTA) 100-62.5-25 MCG/INH AEPB Inhale 1 puff into the lungs daily. I inhalation once daily 180 each 1  . furosemide (LASIX) 20 MG tablet Take 1 tablet (20 mg total) by mouth daily as needed (swelling). 90 tablet 3  . gabapentin (NEURONTIN) 300 MG capsule 1 tab by mouth in AM, 1 po at midday, then 2 by mouth at bedtime for sleep and pain 360 capsule 1  . guaiFENesin (MUCINEX) 600 MG 12 hr tablet Take  1,200 mg by mouth 2 (two) times daily as needed for cough or to loosen phlegm.    . hydroxychloroquine (PLAQUENIL) 200 MG tablet Take 400 mg by mouth at bedtime.    Marland Kitchen ipratropium (ATROVENT) 0.03 % nasal spray Place 2 sprays into both nostrils 3 (three) times daily as needed for rhinitis. 90 mL 1  . levothyroxine (SYNTHROID) 25 MCG tablet Take 1 tablet (25 mcg total) by mouth daily before breakfast. Annual appt due in Nov must see provider for future refills 90 tablet 0  . Lidocaine HCl 2 % CREA Apply 1 application topically daily as needed (for shingles flares).    Marland Kitchen loperamide (IMODIUM) 2 MG capsule Take by mouth.    . loratadine (CLARITIN) 10 MG tablet Take 10 mg by  mouth daily.    . Melatonin 5 MG TABS Take 5 mg by mouth at bedtime.    . metoprolol succinate (TOPROL-XL) 100 MG 24 hr tablet Take 1 tablet (100 mg total) by mouth daily. Take with or immediately following a meal. 90 tablet 1  . mirabegron ER (MYRBETRIQ) 50 MG TB24 tablet Take 50 mg by mouth daily.    . montelukast (SINGULAIR) 10 MG tablet Take 1 tablet (10 mg total) by mouth daily. Annual appt due in Nov must see provider for future refills 90 tablet 1  . Multiple Vitamin (MULTIVITAMIN) tablet Take 1 tablet by mouth daily.      . NYSTATIN PO Take 5 mLs by mouth as needed.    Marland Kitchen omeprazole (PRILOSEC) 40 MG capsule Take 1 capsule (40 mg total) by mouth daily. 90 capsule 3  . Probiotic Product (PROBIOTIC DAILY PO) Take by mouth.    . traZODone (DESYREL) 50 MG tablet Take 1 tablet (50 mg total) by mouth at bedtime and may repeat dose one time if needed. 180 tablet 1   No current facility-administered medications for this visit.    Allergies  Allergen Reactions  . Clarithromycin Other (See Comments)    REACTION: possible rash but could have been legionarres dz with rash  . Lipitor [Atorvastatin] Other (See Comments)    MUSCLE SPASMS  . Simvastatin Other (See Comments)    Muscle and joint pain  . Cardizem [Diltiazem Hcl] Hives   . Contrast Media [Iodinated Diagnostic Agents]     FYI - during admission at [facility] 08/2016 patient developed hives, question related to cardizem or contrast dye - not clear  . Crestor [Rosuvastatin Calcium]     fagitue and joint pain, "NO STATINS"  . Rosuvastatin Calcium Other (See Comments)    fagitue and joint pain, "NO STATINS"    Social History   Socioeconomic History  . Marital status: Married    Spouse name: Shanon Brow  . Number of children: 2  . Years of education: Not on file  . Highest education level: Not on file  Occupational History  . Occupation: retired    Fish farm manager: RETIRED  Tobacco Use  . Smoking status: Former Smoker    Packs/day: 3.00    Years: 42.00    Pack years: 126.00    Types: Cigarettes    Start date: 08/04/1954    Quit date: 12/06/1995    Years since quitting: 24.7  . Smokeless tobacco: Never Used  Vaping Use  . Vaping Use: Never used  Substance and Sexual Activity  . Alcohol use: No    Alcohol/week: 0.0 standard drinks  . Drug use: No  . Sexual activity: Not Currently    Birth control/protection: Surgical    Comment: 1st intercourse 79 yo-Fewer than 5 partners  Other Topics Concern  . Not on file  Social History Narrative   Retired- Dentist Pulmonary:   Originally from [place]. Has always lived in [place]. She has traveled to [place], [place], [place], [place], [place], [place]. Has been on multiple cruises. 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 She did use a face mask consistently to avoid chemical fume exposure. She may have only had an inhaled exposure a couple of times. She currently has a dog. Remote exposure to a parakeet. No mold or hot tub exposure. No asbestos exposure.    Social Determinants of Health   Financial  Resource Strain: Low Risk   . Difficulty of Paying Living Expenses: Not hard at all  Food Insecurity: No Food  Insecurity  . Worried About Charity fundraiser in the Last Year: Never true  . Ran Out of Food in the Last Year: Never true  Transportation Needs: No Transportation Needs  . Lack of Transportation (Medical): No  . Lack of Transportation (Non-Medical): No  Physical Activity: Sufficiently Active  . Days of Exercise per Week: 5 days  . Minutes of Exercise per Session: 30 min  Stress: No Stress Concern Present  . Feeling of Stress : Not at all  Social Connections: Socially Integrated  . Frequency of Communication with Friends and Family: More than three times a week  . Frequency of Social Gatherings with Friends and Family: More than three times a week  . Attends Religious Services: More than 4 times per year  . Active Member of Clubs or Organizations: Yes  . Attends Archivist Meetings: More than 4 times per year  . Marital Status: Married  Human resources officer Violence: Not At Risk  . Fear of Current or Ex-Partner: No  . Emotionally Abused: No  . Physically Abused: No  . Sexually Abused: No     Review of Systems: General: No chills, fever, night sweats or weight changes  Cardiovascular:  No chest pain, dyspnea on exertion, edema, orthopnea, palpitations, paroxysmal nocturnal dyspnea Dermatological: No rash, lesions or masses Respiratory: No cough, dyspnea Urologic: No hematuria, dysuria Abdominal: No nausea, vomiting, diarrhea, bright red blood per rectum, melena, or hematemesis Neurologic: No visual changes, weakness, changes in mental status All other systems reviewed and are otherwise negative except as noted above.  Physical Exam: There were no vitals filed for this visit.  GEN- The patient is well appearing, alert and oriented x 3 today.   HEENT: normocephalic, atraumatic; sclera clear, conjunctiva pink; hearing intact; oropharynx clear; neck supple, no JVP Lymph- no cervical lymphadenopathy Lungs- Clear to ausculation bilaterally, normal work of breathing.  No  wheezes, rales, rhonchi Heart- Regular rate and rhythm, no murmurs, rubs or gallops, PMI not laterally displaced GI- soft, non-tender, non-distended, bowel sounds present, no hepatosplenomegaly Extremities- no clubbing, cyanosis, or edema; DP/PT/radial pulses 2+ bilaterally MS- no significant deformity or atrophy Skin- warm and dry, no rash or lesion Psych- euthymic mood, full affect Neuro- strength and sensation are intact  EKG is ordered. Personal review of EKG from today shows NSR at 65 bpm, QRS 124 ms, PR interval 208 ms  Additional studies reviewed include: Previous EP office notes, Previous echo as below, pharmacy recommendations for holding [place]  Assessment and Plan:  1. Paroxysmal atrial fibrillation On Eliquis 5 mg BID. She is currently dosed appropriately based on weight.  Filed Weights   09/02/20 1120  Weight: 135 lb (61.2 kg)  Continue Toprol XL.  Remains in NSR  2. Chronic combined systolic HF Continue toprol XL Echo 04/2017 with LVEF 45-50%  3. HTN Currently well controlled  4. PVCs Noted on previous cardiac monitor. Minimally symptomatic. Continue BB.   5. Cardiac clearance for "Left reverse total shoulder replacement Echo 04/2017 with LVEF 45-50% with no current HF symptoms. She is on eliquis for h/o stroke with a CHA2DS2VASC of 8.  Per office protocol she may hold her Eliquis for 3 days prior to surgery, but needs to resume AS SOON AS POSSIBLE afterwards with h/o stroke. She is cleared to proceed with surgery from a cardiac perspective with an estimated 0.9% (low risk)  rate of perioperative cardiac complications. She understands that she will have an increased risk of stroke perioperatively while holding eliquis.   RTC to see Dr. Curt Bears in 6 months. Sooner with symptoms.   Shirley Friar, PA-C  09/02/20 8:25 AM

## 2020-09-02 NOTE — Telephone Encounter (Signed)
Will address at visit today, pending Weight, which is hovering right around 60 kg.

## 2020-09-02 NOTE — Patient Instructions (Signed)
Medication Instructions:  *If you need a refill on your cardiac medications before your next appointment, please call your pharmacy*  Follow-Up: At [facility], you and your health needs are our priority.  As part of our continuing mission to provide you with exceptional heart care, we have created designated Provider Care Teams.  These Care Teams include your primary Cardiologist (physician) and Advanced Practice Providers (APPs -  Physician Assistants and Nurse Practitioners) who all work together to provide you with the care you need, when you need it.  We recommend signing up for the patient portal called "MyChart".  Sign up information is provided on this After Visit Summary.  MyChart is used to connect with patients for Virtual Visits (Telemedicine).  Patients are able to view lab/test results, encounter notes, upcoming appointments, etc.  Non-urgent messages can be sent to your provider as well.   To learn more about what you can do with MyChart, go to [email or web address].    Your next appointment:   Your physician recommends that you schedule a follow-up appointment in: 6 MONTHS with Dr. Camnitz  The format for your next appointment:   In Person with Will Camnitz, MD     

## 2020-09-04 ENCOUNTER — Telehealth: Payer: Self-pay | Admitting: Adult Health

## 2020-09-04 NOTE — Telephone Encounter (Signed)
Called and spoke with patient, scheduled her to see Rexene Edison NP on 09/08/2020 at 2:30 pm for surgical clearance.  Form placed in Tammy's cabinet.  Nothing further needed.

## 2020-09-08 ENCOUNTER — Other Ambulatory Visit: Payer: Self-pay

## 2020-09-08 ENCOUNTER — Encounter: Payer: Self-pay | Admitting: Adult Health

## 2020-09-08 ENCOUNTER — Ambulatory Visit (INDEPENDENT_AMBULATORY_CARE_PROVIDER_SITE_OTHER): Payer: Medicare Other

## 2020-09-08 ENCOUNTER — Ambulatory Visit (INDEPENDENT_AMBULATORY_CARE_PROVIDER_SITE_OTHER): Payer: Medicare Other | Admitting: Adult Health

## 2020-09-08 VITALS — BP 122/74 | HR 74 | Ht 64.0 in | Wt 134.0 lb

## 2020-09-08 DIAGNOSIS — Z01818 Encounter for other preprocedural examination: Secondary | ICD-10-CM | POA: Diagnosis not present

## 2020-09-08 DIAGNOSIS — K449 Diaphragmatic hernia without obstruction or gangrene: Secondary | ICD-10-CM | POA: Diagnosis not present

## 2020-09-08 DIAGNOSIS — J449 Chronic obstructive pulmonary disease, unspecified: Secondary | ICD-10-CM

## 2020-09-08 DIAGNOSIS — K219 Gastro-esophageal reflux disease without esophagitis: Secondary | ICD-10-CM | POA: Diagnosis not present

## 2020-09-08 DIAGNOSIS — J309 Allergic rhinitis, unspecified: Secondary | ICD-10-CM

## 2020-09-08 DIAGNOSIS — J438 Other emphysema: Secondary | ICD-10-CM | POA: Diagnosis not present

## 2020-09-08 NOTE — Progress Notes (Signed)
@Patient  ID: [name], female    DOB: 04-02-1941, 79 y.o.   MRN: [id number]  Chief Complaint  Patient presents with  . Follow-up    COPD     Referring provider: Biagio Borg, MD  HPI: 79 year old female former smoker followed for severe COPD, chronic rhinitis and GERD Stable lung nodule (serial CT chest from 2015-2018 showed stable submillimeter right lower lobe nodule consistent with benign etiology) Medical history significant for chronic combined congestive heart failure, A. fib, cardioversion in 2536, embolic stroke and on chronic anticoagulation Previous colon rupture requiring right hemocolectomy with ileostomy for ischemic and necrotic colon in 2017 later ileostomy takedown and repair that was complicated by C. difficile Rheumatoid arthritis on Plaquenil  TEST/EVENTS :  10/10/16:FVC 1.94 L (70%) FEV1 0.93 L (45%) FEV1/FVC 0.48 FEF 25-75 0.35 L (22%) positive bronchodilator response 03/01/16: FVC 2.55 L (91%) FEV1 1.30 L (62%) FEV1/FVC 0.51 FEF 25-75 0.56 L (34%) negative bronchodilator response TLC 5.66 L (111%) RV 116% ERV 133% DLCO uncorrected 62% (hemoglobin 15.1) 12/28/11:FVC 2.89 L (104%) FEV1 1.22 L (62%) FEV1/FVC 0.42 FEF 25-75 0.33 L (15%) negative bronchodilator response TLC 6.19 L (129%) RV 163% ERV 114% DLCO uncorrected 69%  6MWT 10/10/16: Walked 96 meters / Baseline Sat 93% on RA / Nadir Sat 93% on RA @ rest (couldn't finish due to weakness &dyspnea W/ 2:57 left) 08/24/16: Walked 1 lap &was 96% at lowest on room air  IMAGING CXR PA/LAT 06/19/17 :  CXR PA/LAT 05/18/17: Patchy opacification in right middle and lower lobes. No pleural effusion appreciated. Heart normal in size & mediastinum normal in contour.  CT CHEST W/O 01/23/17:Resolution of groundglass opacity. 7 mm right lower lobe nodule unchanged. Apical predominant emphysematous changes. Right middle lobe nodule was not appreciated on this image. No pleural effusion or thickening. No  pathologic mediastinal adenopathy. No pericardial effusion.  CTA CHEST 08/17/16:No evidence of PE. No pleural effusion or thickening. No pathologic mediastinal adenopathy. Groundglass noted within right lung predominantly in the upper lung zone. Moderate upper lobe predominant emphysema which has progressed since 2015. 7 mm right lower lobe nodule has not changed since 2015. Patient has a 4 mm nodule within right middle lobe which appears new.  CXR PA/LAT 05/25/16:Hyperinflation with flattening of the diaphragms. No parenchymal opacity or mass appreciated. No pleural effusion. Heart normal in size &mediastinum normal in contour.  CXR PA/LAT 03/22/16: Hyperinflation with elevation of left hemidiaphragm. No new focal opacity. Heart normal in size & mediastinum normal in contour.  CXR PA/LAT 11/09/15: No focal opacity or effusion appreciated. Heart normal in size. Mediastinum normal in contour. Mild hyperinflation with flattening of the diaphragms.   CARDIAC TTE (06/24/16):LVEF 45-50% with grade 2 diastolic dysfunction. LA moderately to severely dilated. RA normal in size. RV normal in size and function. No aortic stenosis or regurgitation. Moderate centrally directed mitral regurgitation. Paradoxical ventricular septal motion consistent with intraventricular conduction delay. No pulmonic regurgitation. No tricuspid regurgitation. No pericardial effusion.  MICROBIOLOGY Sputum Culture (07/05/17): Candida Species / AFB negative / Moderate Squamous Epithelials   LABS 12/22/15 Alpha-1 antitrypsin: MM (143) 11/2017 IgE 9 (normal)  09/08/2020 Follow up : COPD  Patient presents for a 74-month follow-up.  Patient has underlying COPD.  Patient says overall breathing has been doing well.  She says her activity tolerance has been good.  She denies any flare of cough or wheezing.  No recent COPD flares.  She remains on Trelegy inhaler daily.  Denies any increased albuterol use. No  steroid use. No recent  antibiotics.  Walks twice a week at grocery store, uses a cane or buggy.   Patient has chronic rhinitis is on daily Singulair and Flonase.  Uses Chlor-Trimeton on occasion.  Patient says that she is going for left shoulder surgery.  Needs a pulmonary preop clearance. Date is undecided due to husband having surgery on knee.  Independent, drives, does household chores and all ADLs independently.  No recent flare of COPD . No recent antibiotics or steroids.  Not on oxygen.  Flu shot is utd.  Covid vaccines including booster utd.     Allergies  Allergen Reactions  . Clarithromycin Other (See Comments)    REACTION: possible rash but could have been legionarres dz with rash  . Lipitor [Atorvastatin] Other (See Comments)    MUSCLE SPASMS  . Simvastatin Other (See Comments)    Muscle and joint pain  . Cardizem [Diltiazem Hcl] Hives  . Contrast Media [Iodinated Diagnostic Agents]     FYI - during admission at [facility] 08/2016 patient developed hives, question related to cardizem or contrast dye - not clear  . Crestor [Rosuvastatin Calcium]     fagitue and joint pain, "NO STATINS"  . Rosuvastatin Calcium Other (See Comments)    fagitue and joint pain, "NO STATINS"    Immunization History  Administered Date(s) Administered  . Fluad Quad(high Dose 65+) 08/08/2019, 08/17/2020  . Influenza Split 09/11/2012  . Influenza Whole 09/15/2006, 08/04/2010, 08/11/2011  . Influenza, High Dose Seasonal PF 08/22/2017, 09/05/2018, 08/19/2020  . Influenza,inj,Quad PF,6+ Mos 09/02/2013, 08/26/2014, 08/19/2015, 08/23/2016  . PFIZER SARS-COV-2 Vaccination 12/18/2019, 01/08/2020  . Pneumococcal Conjugate-13 11/29/2013  . Pneumococcal Polysaccharide-23 12/06/2003, 12/05/2006  . Td 12/05/2004  . Zoster 07/07/2008  . Zoster Recombinat (Shingrix) 04/12/2017, 06/19/2017    Past Medical History:  Diagnosis Date  . Allergic rhinitis   . Anxiety   . Atrial septal aneurysm   . Basal cell carcinoma    . Bradycardia   . Cataract    bil cataracts removed  . Chronic combined systolic and diastolic CHF (congestive heart failure) ([place])   . CKD (chronic kidney disease), stage III ([place])   . Clostridium difficile infection   . COPD (chronic obstructive pulmonary disease) ([place])   . Depression 11/29/2013  . DI (detrusor instability)   . Esophageal stricture   . GERD (gastroesophageal reflux disease)   . Hemorrhoids   . Hiatal hernia   . History of kidney stones   . HLD (hyperlipidemia)   . HTN (hypertension)   . Ischemic colon ([place])    a. 08/2016 - adm to  [facility] with a ruptured colon s/p right hemicolectomy with ileostomy formation for ischemic and necrotic colon.   . Lung nodule    a. followed by pulmonology  . Mild CAD    a. minor nonobstructive CAD 08/22/16.  . Nephrolithiasis    hx  . NICM (nonischemic cardiomyopathy) ([place])   . Orthostatic hypotension   . Osteoarthritis   . PAF (paroxysmal atrial fibrillation) ([place])    a. diagnosed 07/2016 s/p DCCV 08/01/16 with recurrence, managed with rate control for now.   . Pneumonia 2013  . PVC's (premature ventricular contractions)   . Shingles since Nov 18, 2013   on right arm from shoulder to wrist  . Status post dilation of esophageal narrowing   . Stroke [facility]) 2001   STROKES, TIA'S  . Syncope    a. 08/2016 felt due to orthostatic hypotension.  Marland Kitchen TIA (transient ischemic attack)  last 2001   anticoagulation therapy on coumadin    Tobacco History: Social History   Tobacco Use  Smoking Status Former Smoker  . Packs/day: 3.00  . Years: 42.00  . Pack years: 126.00  . Types: Cigarettes  . Start date: 08/04/1954  . Quit date: 12/06/1995  . Years since quitting: 24.7  Smokeless Tobacco Never Used   Counseling given: Not Answered   Outpatient Medications Prior to Visit  Medication Sig Dispense Refill  . albuterol (VENTOLIN HFA) 108 (90 Base) MCG/ACT inhaler Inhale 1-2 puffs into the lungs every 6 (six) hours as needed  for wheezing or shortness of breath. 8 g 6  . apixaban (ELIQUIS) 5 MG TABS tablet Take 1 tablet (5 mg total) by mouth 2 (two) times daily. 180 tablet 3  . azelastine (ASTELIN) 0.1 % nasal spray Place 2 sprays into the nose daily. Use in each nostril as directed 90 mL 0  . buPROPion (WELLBUTRIN XL) 300 MG 24 hr tablet Take 1 tablet (300 mg total) by mouth daily. 90 tablet 3  . Calcium Carbonate-Vitamin D (CALCIUM + D PO) Take 1 tablet by mouth 2 (two) times daily.     . colestipol (COLESTID) 1 g tablet Take 2 tablets (2 g total) by mouth 2 (two) times daily. 360 tablet 3  . cyclobenzaprine (FLEXERIL) 5 MG tablet Take 1 tablet (5 mg total) by mouth 3 (three) times daily as needed for muscle spasms. 270 tablet 1  . ezetimibe (ZETIA) 10 MG tablet Take 1 tablet (10 mg total) by mouth daily. 90 tablet 3  . fluticasone (FLONASE) 50 MCG/ACT nasal spray Place 1 spray into both nostrils 2 (two) times daily. 48 g 1  . Fluticasone-Umeclidin-Vilant (TRELEGY ELLIPTA) 100-62.5-25 MCG/INH AEPB Inhale 1 puff into the lungs daily. I inhalation once daily 180 each 1  . furosemide (LASIX) 20 MG tablet Take 1 tablet (20 mg total) by mouth daily as needed (swelling). 90 tablet 3  . gabapentin (NEURONTIN) 300 MG capsule 1 tab by mouth in AM, 1 po at midday, then 2 by mouth at bedtime for sleep and pain (Patient taking differently: 1 tab by mouth in AM, then 2 by mouth at bedtime for sleep and pain) 360 capsule 1  . guaiFENesin (MUCINEX) 600 MG 12 hr tablet Take 1,200 mg by mouth 2 (two) times daily as needed for cough or to loosen phlegm.    . hydroxychloroquine (PLAQUENIL) 200 MG tablet Take 400 mg by mouth at bedtime.    Marland Kitchen ipratropium (ATROVENT) 0.03 % nasal spray Place 2 sprays into both nostrils 3 (three) times daily as needed for rhinitis. 90 mL 1  . levothyroxine (SYNTHROID) 25 MCG tablet Take 1 tablet (25 mcg total) by mouth daily before breakfast. Annual appt due in Nov must see provider for future refills 90 tablet  0  . Lidocaine HCl 2 % CREA Apply 1 application topically daily as needed (for shingles flares).    Marland Kitchen loperamide (IMODIUM) 2 MG capsule Take by mouth.    . loratadine (CLARITIN) 10 MG tablet Take 10 mg by mouth daily.    . Melatonin 5 MG TABS Take 5 mg by mouth at bedtime.    . metoprolol succinate (TOPROL-XL) 100 MG 24 hr tablet Take 1 tablet (100 mg total) by mouth daily. Take with or immediately following a meal. 90 tablet 1  . mirabegron ER (MYRBETRIQ) 50 MG TB24 tablet Take 50 mg by mouth daily.    . montelukast (SINGULAIR) 10 MG tablet  Take 1 tablet (10 mg total) by mouth daily. Annual appt due in Nov must see provider for future refills 90 tablet 1  . Multiple Vitamin (MULTIVITAMIN) tablet Take 1 tablet by mouth daily.      . NYSTATIN PO Take 5 mLs by mouth as needed.    Marland Kitchen omeprazole (PRILOSEC) 40 MG capsule Take 1 capsule (40 mg total) by mouth daily. 90 capsule 3  . Probiotic Product (PROBIOTIC DAILY PO) Take by mouth in the morning and at bedtime.     . traZODone (DESYREL) 50 MG tablet Take 1 tablet (50 mg total) by mouth at bedtime and may repeat dose one time if needed. 180 tablet 1   No facility-administered medications prior to visit.     Review of Systems:   Constitutional:   No  weight loss, night sweats,  Fevers, chills, fatigue, or  lassitude.  HEENT:   No headaches,  Difficulty swallowing,  Tooth/dental problems, or  Sore throat,                No sneezing, itching, ear ache, nasal congestion, post nasal drip,   CV:  No chest pain,  Orthopnea, PND, swelling in lower extremities, anasarca, dizziness, palpitations, syncope.   GI  No heartburn, indigestion, abdominal pain, nausea, vomiting, diarrhea, change in bowel habits, loss of appetite, bloody stools.   Resp: No shortness of breath with exertion or at rest.  No excess mucus, no productive cough,  No non-productive cough,  No coughing up of blood.  No change in color of mucus.  No wheezing.  No chest wall  deformity  Skin: no rash or lesions.  GU: no dysuria, change in color of urine, no urgency or frequency.  No flank pain, no hematuria   MS:  + Shoulder pain   Physical Exam  BP 122/74 (BP Location: Left Arm, Cuff Size: Normal)   Pulse 74   Ht 5\' 4"  (1.626 m)   Wt 134 lb (60.8 kg)   SpO2 97%   BMI 23.00 kg/m   GEN: A/Ox3; pleasant , NAD, well nourished    HEENT:  [place]/AT,  , NOSE-clear, THROAT-clear, no lesions, no postnasal drip or exudate noted.   NECK:  Supple w/ fair ROM; no JVD; normal carotid impulses w/o bruits; no thyromegaly or nodules palpated; no lymphadenopathy.    RESP  Clear  P & A; w/o, wheezes/ rales/ or rhonchi. no accessory muscle use, no dullness to percussion  CARD:  RRR, no m/r/g, no goal this is when I needed coffee peripheral edema, pulses intact, no cyanosis or clubbing.  GI:   Soft & nt; nml bowel sounds; no organomegaly or masses detected.   Musco: Warm bil, no deformities or joint swelling noted.   Neuro: alert, no focal deficits noted.    Skin: Warm, no lesions or rashes    Lab Results:  CBC  Imaging:    PFT Results Latest Ref Rng & Units 10/10/2016 03/01/2016  FVC-Pre L 1.94 2.55  FVC-Predicted Pre % 70 91  FVC-Post L 2.17 2.72  FVC-Predicted Post % 78 97  Pre FEV1/FVC % % 48 51  Post FEV1/FCV % % 51 53  FEV1-Pre L 0.93 1.30  FEV1-Predicted Pre % 45 62  FEV1-Post L 1.11 1.44  DLCO uncorrected ml/min/mmHg - 16.22  DLCO UNC% % - 65  DLCO corrected ml/min/mmHg - 15.47  DLCO COR %Predicted % - 62  DLVA Predicted % - 71  TLC L - 5.66  TLC % Predicted % -  111  RV % Predicted % - 116    No results found for: NITRICOXIDE      Assessment & Plan:   COPD (chronic obstructive pulmonary disease) ([place]) Compensated on present regimen.  Remains independent.  Plan  Patient Instructions  Severe COPD:  Continue Trelegy 1 puff daily Rinse her mouth well after using Trelegy Practice good hand hygiene, social distancing and mask are  key .  Stay active as able.  Chest xray today   Gastroesophageal reflux disease: Continue taking omeprazole daily in am .  Continue taking famotidine in evening .   Chronic Rhinitis: Continue loratadine 10 mg daily As needed   Continue Astelin nose spray daily as needed.  Continue fluticasone nose spray daily  Continue on Singulair daily.   Good luck with upcoming shoulder surgery    We will see you back in 6 months with Dr. Valeta Harms or Kennette Cuthrell NP and As needed       Allergic rhinitis Controlled without exacerbation  Plan  Patient Instructions  Severe COPD:  Continue Trelegy 1 puff daily Rinse her mouth well after using Trelegy Practice good hand hygiene, social distancing and mask are key .  Stay active as able.  Chest xray today   Gastroesophageal reflux disease: Continue taking omeprazole daily in am .  Continue taking famotidine in evening .   Chronic Rhinitis: Continue loratadine 10 mg daily As needed   Continue Astelin nose spray daily as needed.  Continue fluticasone nose spray daily  Continue on Singulair daily.   Good luck with upcoming shoulder surgery    We will see you back in 6 months with Dr. Valeta Harms or Coran Dipaola NP and As needed       Pre-operative clearance Pulmonary preop clearance for left shoulder surgery questionable date Discussion with patient as she has a moderate to higher risk due to age and underlying comorbidities including severe COPD. Patient remains independent is not on oxygen COPD appears to be at baseline.  Patient has a good activity level. Patient may proceed with surgery from a pulmonary aspect with known underlying moderate to high risk.   Major Pulmonary risks identified in the multifactorial risk analysis are but not limited to a) pneumonia; b) recurrent intubation risk; c) prolonged or recurrent acute respiratory failure needing mechanical ventilation; d) prolonged hospitalization; e) DVT/Pulmonary embolism; f) Acute Pulmonary  edema  Recommend 1. Short duration of surgery as much as possible and avoid paralytic if possible 2. Recovery in step down or ICU with Pulmonary consultation if indicated.  3. DVT prophylaxis if indicated 4. Aggressive pulmonary toilet with o2, bronchodilatation, and incentive spirometry and early ambulation        Rexene Edison, NP 09/08/2020

## 2020-09-08 NOTE — Assessment & Plan Note (Signed)
Pulmonary preop clearance for left shoulder surgery questionable date Discussion with patient as she has a moderate to higher risk due to age and underlying comorbidities including severe COPD. Patient remains independent is not on oxygen COPD appears to be at baseline.  Patient has a good activity level. Patient may proceed with surgery from a pulmonary aspect with known underlying moderate to high risk.   Major Pulmonary risks identified in the multifactorial risk analysis are but not limited to a) pneumonia; b) recurrent intubation risk; c) prolonged or recurrent acute respiratory failure needing mechanical ventilation; d) prolonged hospitalization; e) DVT/Pulmonary embolism; f) Acute Pulmonary edema  Recommend 1. Short duration of surgery as much as possible and avoid paralytic if possible 2. Recovery in step down or ICU with Pulmonary consultation if indicated.  3. DVT prophylaxis if indicated 4. Aggressive pulmonary toilet with o2, bronchodilatation, and incentive spirometry and early ambulation

## 2020-09-08 NOTE — Patient Instructions (Addendum)
Severe COPD:  Continue Trelegy 1 puff daily Rinse her mouth well after using Trelegy Practice good hand hygiene, social distancing and mask are key .  Stay active as able.  Chest xray today   Gastroesophageal reflux disease: Continue taking omeprazole daily in am .  Continue taking famotidine in evening .   Chronic Rhinitis: Continue loratadine 10 mg daily As needed   Continue Astelin nose spray daily as needed.  Continue fluticasone nose spray daily  Continue on Singulair daily.   Good luck with upcoming shoulder surgery    We will see you back in 6 months with Dr. Valeta Harms or Christiona Siddique NP and As needed

## 2020-09-08 NOTE — Assessment & Plan Note (Signed)
Compensated on present regimen.  Remains independent.  Plan  Patient Instructions  Severe COPD:  Continue Trelegy 1 puff daily Rinse her mouth well after using Trelegy Practice good hand hygiene, social distancing and mask are key .  Stay active as able.  Chest xray today   Gastroesophageal reflux disease: Continue taking omeprazole daily in am .  Continue taking famotidine in evening .   Chronic Rhinitis: Continue loratadine 10 mg daily As needed   Continue Astelin nose spray daily as needed.  Continue fluticasone nose spray daily  Continue on Singulair daily.   Good luck with upcoming shoulder surgery    We will see you back in 6 months with Dr. Valeta Harms or Rosemae Mcquown NP and As needed

## 2020-09-08 NOTE — Assessment & Plan Note (Signed)
Controlled without exacerbation  Plan  Patient Instructions  Severe COPD:  Continue Trelegy 1 puff daily Rinse her mouth well after using Trelegy Practice good hand hygiene, social distancing and mask are key .  Stay active as able.  Chest xray today   Gastroesophageal reflux disease: Continue taking omeprazole daily in am .  Continue taking famotidine in evening .   Chronic Rhinitis: Continue loratadine 10 mg daily As needed   Continue Astelin nose spray daily as needed.  Continue fluticasone nose spray daily  Continue on Singulair daily.   Good luck with upcoming shoulder surgery    We will see you back in 6 months with Dr. Valeta Harms or Shirah Roseman NP and As needed

## 2020-09-21 ENCOUNTER — Encounter: Payer: Self-pay | Admitting: Internal Medicine

## 2020-09-21 NOTE — Telephone Encounter (Signed)
Pt had OV with  TP 09/08/20 and in there was preop clearance information discussed in regards to pt's upcoming shoulder surgery.  mychart message sent by pt wanting to know if paperwork was sent back to [facility].  Tammy, please advise if there was a form that was filled out or if we were to just fax pt's last OV with you to [facility].

## 2020-09-22 NOTE — Telephone Encounter (Signed)
I have not seen paperwork but did write in my note cleared.

## 2020-09-22 NOTE — Telephone Encounter (Signed)
OV note was faxed to [phone].

## 2020-10-05 MED ORDER — IPRATROPIUM BROMIDE 0.03 % NA SOLN: 2.0000 | Freq: Three times a day (TID) | NASAL | 1 refills | Status: DC | PRN

## 2020-10-09 ENCOUNTER — Ambulatory Visit: Payer: Medicare Other | Admitting: Internal Medicine

## 2020-10-16 ENCOUNTER — Encounter: Payer: Self-pay | Admitting: Internal Medicine

## 2020-10-16 ENCOUNTER — Other Ambulatory Visit: Payer: Self-pay

## 2020-10-16 MED ORDER — BUPROPION HCL ER (XL) 300 MG PO TB24
300.0000 mg | ORAL_TABLET | Freq: Every day | ORAL | 3 refills | Status: DC
Start: 2020-10-16 — End: 2021-10-26

## 2020-10-16 MED ORDER — MONTELUKAST SODIUM 10 MG PO TABS
10.0000 mg | ORAL_TABLET | Freq: Every day | ORAL | 1 refills | Status: DC
Start: 2020-10-16 — End: 2021-05-14

## 2020-10-22 ENCOUNTER — Other Ambulatory Visit: Payer: Self-pay

## 2020-10-22 MED ORDER — FLUTICASONE PROPIONATE 50 MCG/ACT NA SUSP: 1.0000 | Freq: Two times a day (BID) | NASAL | 1 refills | Status: DC

## 2020-10-22 NOTE — Telephone Encounter (Signed)
Refill ordered for pt's fluticasone. Pt notified. Nothing further needed at this time.

## 2020-11-03 ENCOUNTER — Ambulatory Visit: Payer: Medicare Other | Admitting: Internal Medicine

## 2020-11-12 ENCOUNTER — Encounter: Payer: Self-pay | Admitting: Internal Medicine

## 2020-11-12 DIAGNOSIS — M19012 Primary osteoarthritis, left shoulder: Secondary | ICD-10-CM | POA: Diagnosis not present

## 2020-11-12 DIAGNOSIS — M7502 Adhesive capsulitis of left shoulder: Secondary | ICD-10-CM | POA: Diagnosis not present

## 2020-11-12 DIAGNOSIS — M25512 Pain in left shoulder: Secondary | ICD-10-CM | POA: Diagnosis not present

## 2020-11-12 DIAGNOSIS — M75122 Complete rotator cuff tear or rupture of left shoulder, not specified as traumatic: Secondary | ICD-10-CM | POA: Diagnosis not present

## 2020-11-12 DIAGNOSIS — G8929 Other chronic pain: Secondary | ICD-10-CM | POA: Diagnosis not present

## 2020-11-12 MED ORDER — EZETIMIBE 10 MG PO TABS: 10.0000 mg | ORAL_TABLET | Freq: Every day | ORAL | 0 refills | Status: DC

## 2020-11-17 ENCOUNTER — Encounter: Payer: Self-pay | Admitting: Internal Medicine

## 2020-11-17 ENCOUNTER — Ambulatory Visit (INDEPENDENT_AMBULATORY_CARE_PROVIDER_SITE_OTHER): Payer: Medicare Other | Admitting: Internal Medicine

## 2020-11-17 ENCOUNTER — Ambulatory Visit: Payer: Medicare Other | Admitting: Internal Medicine

## 2020-11-17 ENCOUNTER — Other Ambulatory Visit: Payer: Self-pay

## 2020-11-17 VITALS — BP 110/80 | HR 70 | Temp 97.8°F | Ht 64.0 in | Wt 130.0 lb

## 2020-11-17 DIAGNOSIS — E538 Deficiency of other specified B group vitamins: Secondary | ICD-10-CM | POA: Diagnosis not present

## 2020-11-17 DIAGNOSIS — E559 Vitamin D deficiency, unspecified: Secondary | ICD-10-CM | POA: Diagnosis not present

## 2020-11-17 DIAGNOSIS — J438 Other emphysema: Secondary | ICD-10-CM

## 2020-11-17 DIAGNOSIS — E7800 Pure hypercholesterolemia, unspecified: Secondary | ICD-10-CM

## 2020-11-17 DIAGNOSIS — R739 Hyperglycemia, unspecified: Secondary | ICD-10-CM

## 2020-11-17 DIAGNOSIS — N183 Chronic kidney disease, stage 3 unspecified: Secondary | ICD-10-CM | POA: Diagnosis not present

## 2020-11-17 DIAGNOSIS — I10 Essential (primary) hypertension: Secondary | ICD-10-CM | POA: Diagnosis not present

## 2020-11-17 NOTE — Progress Notes (Deleted)
   Subjective:    Patient ID: [name], female    DOB: [birth year], 79 y.o.   MRN: [id number]  HPI   Has been losing wt with change in diet due to just not wanting certain foods anymore.   Wt Readings from Last 3 Encounters:  11/17/20 130 lb (59 kg)  09/08/20 134 lb (60.8 kg)  09/02/20 135 lb (61.2 kg)     Review of Systems     Objective:   Physical Exam        Assessment & Plan:

## 2020-11-17 NOTE — Patient Instructions (Addendum)
Please continue all other medications as before, and refills have been done if requested.  Please have the pharmacy call with any other refills you may need.  Please continue your efforts at being more active, low cholesterol diet, and weight control.  You are otherwise up to date with prevention measures today.  Please keep your appointments with your specialists as you may have planned  Please make an Appointment to return in 6 months, or sooner if needed, also with Lab Appointment for testing done 3-5 days before at the [facility] (so this is for TWO appointments - please see the scheduling desk as you leave)  Due to the ongoing Covid 19 pandemic, our lab now requires an appointment for any labs done at our office.  If you need labs done and do not have an appointment, please call our office ahead of time to schedule before presenting to the lab for your testing.  Please make an Appointment to return in 6 months, or sooner if needed

## 2020-11-19 ENCOUNTER — Telehealth: Payer: Self-pay | Admitting: Internal Medicine

## 2020-11-19 MED ORDER — COLESTIPOL HCL 1 G PO TABS: 2.0000 g | ORAL_TABLET | Freq: Two times a day (BID) | ORAL | 3 refills | Status: DC

## 2020-11-19 NOTE — Telephone Encounter (Signed)
Refilled Colestid

## 2020-12-03 ENCOUNTER — Encounter: Payer: Self-pay | Admitting: Internal Medicine

## 2020-12-03 DIAGNOSIS — M8589 Other specified disorders of bone density and structure, multiple sites: Secondary | ICD-10-CM | POA: Diagnosis not present

## 2020-12-03 DIAGNOSIS — M150 Primary generalized (osteo)arthritis: Secondary | ICD-10-CM | POA: Diagnosis not present

## 2020-12-03 DIAGNOSIS — M2550 Pain in unspecified joint: Secondary | ICD-10-CM | POA: Diagnosis not present

## 2020-12-03 DIAGNOSIS — Z6822 Body mass index (BMI) 22.0-22.9, adult: Secondary | ICD-10-CM | POA: Diagnosis not present

## 2020-12-03 DIAGNOSIS — Z79899 Other long term (current) drug therapy: Secondary | ICD-10-CM | POA: Diagnosis not present

## 2020-12-03 DIAGNOSIS — M154 Erosive (osteo)arthritis: Secondary | ICD-10-CM | POA: Diagnosis not present

## 2020-12-03 NOTE — Progress Notes (Signed)
Established Patient Office Visit  Subjective:  Patient ID: [name], female    DOB: 02-13-1941  Age: 79 y.o. MRN: [id number]       Chief Complaint: (concise statement describing the symptom, problem, condition, diagnosis, physician recommended return, or other factor as reason for encounter) follow up HTN, HLD, ckd, copd       HPI:  [name] is a 79 y.o. female here to f/u; overall doing ok,  Pt denies chest pain, increasing sob or doe, wheezing, orthopnea, PND, increased LE swelling, palpitations, dizziness or syncope.  Pt denies new neurological symptoms such as new headache, or facial or extremity weakness or numbness.  Pt denies polydipsia, polyuria, or symptomatic low sugars. Pt states overall good compliance with meds, mostly trying to follow appropriate diet, with wt overall down several lbs with better diet, but little exercise however.  Plans for PT after jan 1 and trying to hold off on left shoulder surgury.  No other new complaints Wt Readings from Last 3 Encounters:  11/17/20 130 lb (59 kg)  09/08/20 134 lb (60.8 kg)  09/02/20 135 lb (61.2 kg)   BP Readings from Last 3 Encounters:  11/17/20 110/80  09/08/20 122/74  09/02/20 130/68    Past Medical History:  Diagnosis Date  . Allergic rhinitis   . Anxiety   . Atrial septal aneurysm   . Basal cell carcinoma   . Bradycardia   . Cataract    bil cataracts removed  . Chronic combined systolic and diastolic CHF (congestive heart failure) (HCC)   . CKD (chronic kidney disease), stage III (HCC)   . Clostridium difficile infection   . COPD (chronic obstructive pulmonary disease) (HCC)   . Depression 11/29/2013  . DI (detrusor instability)   . Esophageal stricture   . GERD (gastroesophageal reflux disease)   . Hemorrhoids   . Hiatal hernia   . History of kidney stones   . HLD (hyperlipidemia)   . HTN (hypertension)   . Ischemic colon (HCC)    a. 08/2016 - adm to  [facility] with a ruptured colon s/p right  hemicolectomy with ileostomy formation for ischemic and necrotic colon.   . Lung nodule    a. followed by pulmonology  . Mild CAD    a. minor nonobstructive CAD 08/22/16.  . Nephrolithiasis    hx  . NICM (nonischemic cardiomyopathy) (HCC)   . Orthostatic hypotension   . Osteoarthritis   . PAF (paroxysmal atrial fibrillation) (HCC)    a. diagnosed 07/2016 s/p DCCV 08/01/16 with recurrence, managed with rate control for now.   . Pneumonia 2013  . PVC's (premature ventricular contractions)   . Shingles since Nov 18, 2013   on right arm from shoulder to wrist  . Status post dilation of esophageal narrowing   . Stroke [facility]) 2001   STROKES, TIA'S  . Syncope    a. 08/2016 felt due to orthostatic hypotension.  Marland Kitchen TIA (transient ischemic attack) last 2001   anticoagulation therapy on coumadin   Past Surgical History:  Procedure Laterality Date  . ANTERIOR LATERAL LUMBAR FUSION 4 LEVELS Right 07/03/2015   Procedure: Right Lumbar one-two, Lumbar two-three, Lumbar three-four, Lumbar four-five Anterior lateral lumbar interbody fusion;  Surgeon: Maeola Harman, MD;  Location: [facility] NEURO ORS;  Service: Neurosurgery;  Laterality: Right;  Right L1-2 L2-3 L3-4 L4-5 Anterior lateral lumbar interbody fusion  . BALLOON DILATION N/A 03/03/2014   Procedure: BALLOON DILATION;  Surgeon: Louis Meckel, MD;  Location: [facility]  ENDOSCOPY;  Service: Endoscopy;  Laterality: N/A;  . BLADDER SUSPENSION    . BREAST BIOPSY    . CARDIAC CATHETERIZATION N/A 08/22/2016   Procedure: Left Heart Cath and Coronary Angiography;  Surgeon: Peter M Martinique, MD;  Location: [place] CV LAB;  Service: Cardiovascular;  Laterality: N/A;  . CARDIOVERSION N/A 08/01/2016   Procedure: CARDIOVERSION;  Surgeon: Jerline Pain, MD;  Location: [place];  Service: Cardiovascular;  Laterality: N/A;  . CARDIOVERSION N/A 10/11/2016   Procedure: CARDIOVERSION;  Surgeon: Sueanne Margarita, MD;  Location: [facility] ENDOSCOPY;  Service: Cardiovascular;  Laterality:  N/A;  . COLONOSCOPY    . ESOPHAGOGASTRODUODENOSCOPY N/A 03/03/2014   Procedure: ESOPHAGOGASTRODUODENOSCOPY (EGD);  Surgeon: Inda Castle, MD;  Location: [facility];  Service: Endoscopy;  Laterality: N/A;  . EXCISION OF BASAL CELL CA  2011  . FINGER SURGERY    . GLUTEUS MINIMUS REPAIR Right 03/15/2016   Procedure: RIGHT HIP GLUTEUS MEDIUS TENDON REPAIR;  Surgeon: Paralee Cancel, MD;  Location: [facility] ORS;  Service: Orthopedics;  Laterality: Right;  . ileostomy reversal    . LUMBAR PERCUTANEOUS PEDICLE SCREW 4 LEVEL Right 07/03/2015   Procedure: Lumbar one-Sacral one Bilateral percutaneous pedicle screws;  Surgeon: Erline Levine, MD;  Location: [place] NEURO ORS;  Service: Neurosurgery;  Laterality: Right;  L1-S1 Bilateral percutaneous pedicle screws  . RECTOCELE REPAIR  2008   A&P REPAIR with vaginal repair-DR. MCDIARMID  . THUMB SURGERY Right 10-15 yrs ago  . UPPER GASTROINTESTINAL ENDOSCOPY    . VAGINAL HYSTERECTOMY  1983   NO BSO-DR. MABRY  . VARICOSE VEIN SURGERY      reports that she quit smoking about 25 years ago. Her smoking use included cigarettes. She started smoking about 66 years ago. She has a 126.00 pack-year smoking history. She has never used smokeless tobacco. She reports that she does not drink alcohol and does not use drugs. family history includes Breast cancer in her maternal aunt, maternal aunt, maternal aunt, and mother; COPD in her mother; Emphysema in her mother; Uterine cancer in her mother. Allergies  Allergen Reactions  . Clarithromycin Other (See Comments)    REACTION: possible rash but could have been legionarres dz with rash  . Lipitor [Atorvastatin] Other (See Comments)    MUSCLE SPASMS  . Simvastatin Other (See Comments)    Muscle and joint pain  . Cardizem [Diltiazem Hcl] Hives  . Contrast Media [Iodinated Diagnostic Agents]     FYI - during admission at [facility] 08/2016 patient developed hives, question related to cardizem or contrast dye - not clear  .  Crestor [Rosuvastatin Calcium]     fagitue and joint pain, "NO STATINS"  . Rosuvastatin Calcium Other (See Comments)    fagitue and joint pain, "NO STATINS"   Current Outpatient Medications on File Prior to Visit  Medication Sig Dispense Refill  . albuterol (VENTOLIN HFA) 108 (90 Base) MCG/ACT inhaler Inhale 1-2 puffs into the lungs every 6 (six) hours as needed for wheezing or shortness of breath. 8 g 6  . apixaban (ELIQUIS) 5 MG TABS tablet Take 1 tablet (5 mg total) by mouth 2 (two) times daily. 180 tablet 3  . azelastine (ASTELIN) 0.1 % nasal spray Place 2 sprays into the nose daily. Use in each nostril as directed 90 mL 0  . buPROPion (WELLBUTRIN XL) 300 MG 24 hr tablet Take 1 tablet (300 mg total) by mouth daily. 90 tablet 3  . Calcium Carbonate-Vitamin D (CALCIUM + D PO) Take 1 tablet by  mouth 2 (two) times daily.    . cyclobenzaprine (FLEXERIL) 5 MG tablet Take 1 tablet (5 mg total) by mouth 3 (three) times daily as needed for muscle spasms. 270 tablet 1  . ezetimibe (ZETIA) 10 MG tablet Take 1 tablet (10 mg total) by mouth daily. 90 tablet 0  . fluticasone (FLONASE) 50 MCG/ACT nasal spray Place 1 spray into both nostrils 2 (two) times daily. 48 g 1  . Fluticasone-Umeclidin-Vilant (TRELEGY ELLIPTA) 100-62.5-25 MCG/INH AEPB Inhale 1 puff into the lungs daily. I inhalation once daily 180 each 1  . furosemide (LASIX) 20 MG tablet Take 1 tablet (20 mg total) by mouth daily as needed (swelling). 90 tablet 3  . gabapentin (NEURONTIN) 300 MG capsule 1 tab by mouth in AM, 1 po at midday, then 2 by mouth at bedtime for sleep and pain (Patient taking differently: 1 tab by mouth in AM, then 2 by mouth at bedtime for sleep and pain) 360 capsule 1  . guaiFENesin (MUCINEX) 600 MG 12 hr tablet Take 1,200 mg by mouth 2 (two) times daily as needed for cough or to loosen phlegm.    . hydroxychloroquine (PLAQUENIL) 200 MG tablet Take 400 mg by mouth at bedtime.    Marland Kitchen ipratropium (ATROVENT) 0.03 % nasal spray  Place 2 sprays into both nostrils 3 (three) times daily as needed for rhinitis. 90 mL 1  . levothyroxine (SYNTHROID) 25 MCG tablet Take 1 tablet (25 mcg total) by mouth daily before breakfast. Annual appt due in Nov must see provider for future refills 90 tablet 0  . Lidocaine HCl 2 % CREA Apply 1 application topically daily as needed (for shingles flares).    Marland Kitchen loperamide (IMODIUM) 2 MG capsule Take by mouth.    . loratadine (CLARITIN) 10 MG tablet Take 10 mg by mouth daily.    . Melatonin 5 MG TABS Take 5 mg by mouth at bedtime.    . metoprolol succinate (TOPROL-XL) 100 MG 24 hr tablet Take 1 tablet (100 mg total) by mouth daily. Take with or immediately following a meal. 90 tablet 1  . mirabegron ER (MYRBETRIQ) 50 MG TB24 tablet Take 50 mg by mouth daily.    . montelukast (SINGULAIR) 10 MG tablet Take 1 tablet (10 mg total) by mouth daily. Annual appt due in Nov must see provider for future refills 90 tablet 1  . Multiple Vitamin (MULTIVITAMIN) tablet Take 1 tablet by mouth daily.    . NYSTATIN PO Take 5 mLs by mouth as needed.    Marland Kitchen omeprazole (PRILOSEC) 40 MG capsule Take 1 capsule (40 mg total) by mouth daily. 90 capsule 3  . Probiotic Product (PROBIOTIC DAILY PO) Take by mouth in the morning and at bedtime.     . traZODone (DESYREL) 50 MG tablet Take 1 tablet (50 mg total) by mouth at bedtime and may repeat dose one time if needed. 180 tablet 1   No current facility-administered medications on file prior to visit.        ROS:  All others reviewed and negative.  Objective        PE:  BP 110/80 (BP Location: Left Arm, Patient Position: Sitting, Cuff Size: Large)   Pulse 70   Temp 97.8 F (36.6 C) (Oral)   Ht 5\' 4"  (1.626 m)   Wt 130 lb (59 kg)   SpO2 97%   BMI 22.31 kg/m                 Constitutional: Pt  appears in NAD               HENT: Head: NCAT.                Right Ear: External ear normal.                 Left Ear: External ear normal.                Eyes: . Pupils are  equal, round, and reactive to light. Conjunctivae and EOM are normal               Nose: without d/c or deformity               Neck: Neck supple. Gross normal ROM               Cardiovascular: Normal rate and regular rhythm.                 Pulmonary/Chest: Effort normal and breath sounds without rales or wheezing.                Abd:  Soft, NT, ND, + BS, no organomegaly               Neurological: Pt is alert. At baseline orientation, motor grossly intact               Skin: Skin is warm. No rashes, no other new lesions, LE edema - none               Psychiatric: Pt behavior is normal without agitation   Assessment/Plan:  DEAIRAH ARTUSO is a 79 y.o. White or Caucasian [1] female with  has a past medical history of Allergic rhinitis, Anxiety, Atrial septal aneurysm, Basal cell carcinoma, Bradycardia, Cataract, Chronic combined systolic and diastolic CHF (congestive heart failure) ([place]), CKD (chronic kidney disease), stage III ([place]), Clostridium difficile infection, COPD (chronic obstructive pulmonary disease) ([place]), Depression (11/29/2013), DI (detrusor instability), Esophageal stricture, GERD (gastroesophageal reflux disease), Hemorrhoids, Hiatal hernia, History of kidney stones, HLD (hyperlipidemia), HTN (hypertension), Ischemic colon ([place]), Lung nodule, Mild CAD, Nephrolithiasis, NICM (nonischemic cardiomyopathy) ([place]), Orthostatic hypotension, Osteoarthritis, PAF (paroxysmal atrial fibrillation) ([place]), Pneumonia (2013), PVC's (premature ventricular contractions), Shingles (since Nov 18, 2013), Status post dilation of esophageal narrowing, Stroke ([place]) (2001), Syncope, and TIA (transient ischemic attack) (last 2001).   Assessment Plan  See problem oriented assessment and plan Labs reviewed for each problem: Lab Results  Component Value Date   WBC 6.5 10/29/2019   HGB 12.6 10/29/2019   HCT 39.1 10/29/2019   PLT 186.0 10/29/2019   GLUCOSE 80 10/29/2019   CHOL 161 10/29/2019   TRIG 175.0 (H)  10/29/2019   HDL 55.70 10/29/2019   LDLDIRECT 133.0 10/17/2018   LDLCALC 70 10/29/2019   ALT 13 10/29/2019   AST 20 10/29/2019   NA 136 10/29/2019   K 4.0 10/29/2019   CL 100 10/29/2019   CREATININE 1.13 10/29/2019   BUN 19 10/29/2019   CO2 29 10/29/2019   TSH 2.34 10/29/2019   INR 1.21 08/17/2016   HGBA1C 5.7 10/29/2019    Micro: none  Cardiac tracings I have personally interpreted today:  none  Pertinent Radiological findings (summarize): none   I spent total 32 minutes in caring for the patient for this visit:  1) by communicating with the patient and family/caregiver during the visit  2) by review of pertinent vital sign data, physical examination and labs as documented in the assessment  and plan  3) by review of pertinent imaging - none today  4) by review of pertinent procedures - none today  5) by obtaining and reviewing separately obtained information from family/caretaker and Care Everywhere - none today  6) by ordering medications  7) by ordering tests  8) by documenting all of this clinical information in the EHR including the management of each problem noted today in assessment and plan  There are no preventive care reminders to display for this patient.  Problem List Items Addressed This Visit      High   COPD (chronic obstructive pulmonary disease) ([place])    stable overall by history and exam, recent data reviewed with pt, and pt to continue medical treatment as before,  to f/u any worsening symptoms or concerns         Medium   HTN (hypertension) - Primary    BP Readings from Last 3 Encounters:  11/17/20 110/80  09/08/20 122/74  09/02/20 130/68  stable overall by history and exam, recent data reviewed with pt, and pt to continue medical treatment as before,  to f/u any worsening symptoms or concerns       HLD (hyperlipidemia)    Lab Results  Component Value Date   LDLCALC 70 10/29/2019  stable overall by history and exam, recent data  reviewed with pt, and pt to continue medical treatment as before,  to f/u any worsening symptoms or concerns       Relevant Orders   Lipid panel   Hepatic function panel   CBC with Differential/Platelet   TSH   Urinalysis, Routine w reflex microscopic   Basic metabolic panel   CKD (chronic kidney disease) stage 3, GFR 30-59 ml/min (HCC)    Lab Results  Component Value Date   CREATININE 1.13 10/29/2019  .stabe        Other Visit Diagnoses    Hyperglycemia       Relevant Orders   Hemoglobin A1c   B12 deficiency       Relevant Orders   Vitamin B12   Vitamin D deficiency       Relevant Orders   VITAMIN D 25 Hydroxy (Vit-D Deficiency, Fractures)      No orders of the defined types were placed in this encounter.   Follow-up: Return in about 6 months (around 05/18/2021).   Cathlean Cower, MD 12/03/2020 8:18 PM [facility]

## 2020-12-03 NOTE — Assessment & Plan Note (Signed)
Lab Results  Component Value Date   LDLCALC 70 10/29/2019  stable overall by history and exam, recent data reviewed with pt, and pt to continue medical treatment as before,  to f/u any worsening symptoms or concerns

## 2020-12-03 NOTE — Assessment & Plan Note (Signed)
stable overall by history and exam, recent data reviewed with pt, and pt to continue medical treatment as before,  to f/u any worsening symptoms or concerns  

## 2020-12-03 NOTE — Assessment & Plan Note (Signed)
BP Readings from Last 3 Encounters:  11/17/20 110/80  09/08/20 122/74  09/02/20 130/68  stable overall by history and exam, recent data reviewed with pt, and pt to continue medical treatment as before,  to f/u any worsening symptoms or concerns

## 2020-12-03 NOTE — Assessment & Plan Note (Signed)
Lab Results  Component Value Date   CREATININE 1.13 10/29/2019  .stabe

## 2020-12-07 ENCOUNTER — Telehealth: Payer: Self-pay | Admitting: Cardiology

## 2020-12-07 NOTE — Telephone Encounter (Signed)
[name] is calling to see if Dr. Elberta Fortis would be okay with the patient doing Electrical Stimulation in physical therapy. It will be on her left shoulder. Please advise.

## 2020-12-08 DIAGNOSIS — J449 Chronic obstructive pulmonary disease, unspecified: Secondary | ICD-10-CM

## 2020-12-08 DIAGNOSIS — J438 Other emphysema: Secondary | ICD-10-CM

## 2020-12-08 MED ORDER — ALBUTEROL SULFATE HFA 108 (90 BASE) MCG/ACT IN AERS: 1.0000 | INHALATION_SPRAY | Freq: Four times a day (QID) | RESPIRATORY_TRACT | 3 refills | Status: AC | PRN

## 2020-12-08 MED ORDER — TRELEGY ELLIPTA 100-62.5-25 MCG/INH IN AEPB: 1.0000 | INHALATION_SPRAY | Freq: Every day | RESPIRATORY_TRACT | 1 refills | Status: AC

## 2020-12-09 NOTE — Telephone Encounter (Signed)
Ok to do electrical stimulation

## 2020-12-11 DIAGNOSIS — M19012 Primary osteoarthritis, left shoulder: Secondary | ICD-10-CM | POA: Diagnosis not present

## 2020-12-11 DIAGNOSIS — M25512 Pain in left shoulder: Secondary | ICD-10-CM | POA: Diagnosis not present

## 2020-12-11 DIAGNOSIS — M7502 Adhesive capsulitis of left shoulder: Secondary | ICD-10-CM | POA: Diagnosis not present

## 2020-12-11 DIAGNOSIS — M75122 Complete rotator cuff tear or rupture of left shoulder, not specified as traumatic: Secondary | ICD-10-CM | POA: Diagnosis not present

## 2020-12-11 NOTE — Telephone Encounter (Signed)
Advised [name] that it is ok to do electrical stimulation, per Dr. Curt Bears. She appreciates the return call on this.

## 2020-12-14 DIAGNOSIS — M19012 Primary osteoarthritis, left shoulder: Secondary | ICD-10-CM | POA: Diagnosis not present

## 2020-12-14 DIAGNOSIS — M25512 Pain in left shoulder: Secondary | ICD-10-CM | POA: Diagnosis not present

## 2020-12-14 DIAGNOSIS — M75122 Complete rotator cuff tear or rupture of left shoulder, not specified as traumatic: Secondary | ICD-10-CM | POA: Diagnosis not present

## 2020-12-14 DIAGNOSIS — M7502 Adhesive capsulitis of left shoulder: Secondary | ICD-10-CM | POA: Diagnosis not present

## 2020-12-17 DIAGNOSIS — M25512 Pain in left shoulder: Secondary | ICD-10-CM | POA: Diagnosis not present

## 2020-12-17 DIAGNOSIS — M19012 Primary osteoarthritis, left shoulder: Secondary | ICD-10-CM | POA: Diagnosis not present

## 2020-12-17 DIAGNOSIS — M75122 Complete rotator cuff tear or rupture of left shoulder, not specified as traumatic: Secondary | ICD-10-CM | POA: Diagnosis not present

## 2020-12-17 DIAGNOSIS — M7502 Adhesive capsulitis of left shoulder: Secondary | ICD-10-CM | POA: Diagnosis not present

## 2020-12-24 ENCOUNTER — Other Ambulatory Visit: Payer: Self-pay | Admitting: Internal Medicine

## 2020-12-24 ENCOUNTER — Encounter: Payer: Self-pay | Admitting: Internal Medicine

## 2020-12-24 DIAGNOSIS — M19012 Primary osteoarthritis, left shoulder: Secondary | ICD-10-CM | POA: Diagnosis not present

## 2020-12-24 DIAGNOSIS — M7502 Adhesive capsulitis of left shoulder: Secondary | ICD-10-CM | POA: Diagnosis not present

## 2020-12-24 DIAGNOSIS — M25512 Pain in left shoulder: Secondary | ICD-10-CM | POA: Diagnosis not present

## 2020-12-24 DIAGNOSIS — M75122 Complete rotator cuff tear or rupture of left shoulder, not specified as traumatic: Secondary | ICD-10-CM | POA: Diagnosis not present

## 2020-12-24 MED ORDER — APIXABAN 5 MG PO TABS: 5.0000 mg | ORAL_TABLET | Freq: Two times a day (BID) | ORAL | 3 refills | Status: DC

## 2020-12-24 MED ORDER — LEVOTHYROXINE SODIUM 25 MCG PO TABS: 25.0000 ug | ORAL_TABLET | Freq: Every day | ORAL | 3 refills | Status: AC

## 2020-12-28 DIAGNOSIS — M7502 Adhesive capsulitis of left shoulder: Secondary | ICD-10-CM | POA: Diagnosis not present

## 2020-12-28 DIAGNOSIS — M75122 Complete rotator cuff tear or rupture of left shoulder, not specified as traumatic: Secondary | ICD-10-CM | POA: Diagnosis not present

## 2020-12-28 DIAGNOSIS — M19012 Primary osteoarthritis, left shoulder: Secondary | ICD-10-CM | POA: Diagnosis not present

## 2020-12-28 DIAGNOSIS — M25512 Pain in left shoulder: Secondary | ICD-10-CM | POA: Diagnosis not present

## 2020-12-31 ENCOUNTER — Encounter: Payer: Self-pay | Admitting: Internal Medicine

## 2020-12-31 MED ORDER — METOPROLOL SUCCINATE ER 100 MG PO TB24: 100.0000 mg | ORAL_TABLET | Freq: Every day | ORAL | 2 refills | Status: DC

## 2021-01-01 ENCOUNTER — Encounter: Payer: Self-pay | Admitting: Internal Medicine

## 2021-01-01 MED ORDER — TRAZODONE HCL 50 MG PO TABS: 50.0000 mg | ORAL_TABLET | Freq: Every evening | ORAL | 1 refills | Status: DC | PRN

## 2021-01-04 DIAGNOSIS — M25512 Pain in left shoulder: Secondary | ICD-10-CM | POA: Diagnosis not present

## 2021-01-04 DIAGNOSIS — M19012 Primary osteoarthritis, left shoulder: Secondary | ICD-10-CM | POA: Diagnosis not present

## 2021-01-04 DIAGNOSIS — M7502 Adhesive capsulitis of left shoulder: Secondary | ICD-10-CM | POA: Diagnosis not present

## 2021-01-04 DIAGNOSIS — M75122 Complete rotator cuff tear or rupture of left shoulder, not specified as traumatic: Secondary | ICD-10-CM | POA: Diagnosis not present

## 2021-01-11 ENCOUNTER — Ambulatory Visit (INDEPENDENT_AMBULATORY_CARE_PROVIDER_SITE_OTHER): Payer: Medicare Other | Admitting: Internal Medicine

## 2021-01-11 ENCOUNTER — Encounter: Payer: Self-pay | Admitting: Internal Medicine

## 2021-01-11 VITALS — BP 128/70 | HR 64 | Ht 64.0 in | Wt 130.4 lb

## 2021-01-11 DIAGNOSIS — R197 Diarrhea, unspecified: Secondary | ICD-10-CM | POA: Diagnosis not present

## 2021-01-11 DIAGNOSIS — K222 Esophageal obstruction: Secondary | ICD-10-CM | POA: Diagnosis not present

## 2021-01-11 DIAGNOSIS — K2100 Gastro-esophageal reflux disease with esophagitis, without bleeding: Secondary | ICD-10-CM | POA: Diagnosis not present

## 2021-01-11 DIAGNOSIS — R159 Full incontinence of feces: Secondary | ICD-10-CM

## 2021-01-11 MED ORDER — METRONIDAZOLE 250 MG PO TABS: 250.0000 mg | ORAL_TABLET | Freq: Three times a day (TID) | ORAL | 0 refills | Status: DC

## 2021-01-11 MED ORDER — COLESTIPOL HCL 1 G PO TABS: 2.0000 g | ORAL_TABLET | Freq: Two times a day (BID) | ORAL | 3 refills | Status: DC

## 2021-01-11 NOTE — Patient Instructions (Signed)
We have sent the following medications to your pharmacy for you to pick up at your convenience:  Flagyl, Colestid  You may use Imodium as needed.  Please follow up in 6 months

## 2021-01-11 NOTE — Progress Notes (Signed)
HISTORY OF PRESENT ILLNESS:  [name] is a 80 y.o. female with multiple medical problems.  She is status post right hemicolectomy with temporary ileostomy and subsequent reversal.  She was evaluated November 2019 regarding chronic diarrhea and increased gas.  She was last seen in the office May 14, 2020 regarding the same.  Her issues have been managed with Colestid 2 g twice daily, Imodium as needed, and protective undergarments at night.  She continues with intermittent problems with loose bowels and incontinence.  She may go several days without any problems then have issues.  She does report nonspecific decreased appetite with associated slow weight loss.  No abdominal pain.  Significant problems with increased intestinal gas.  Has not been treated with metronidazole since her initial course, which helped.  She does have history of GERD complicated by peptic stricture for which she underwent esophageal dilation in 2017.  Last colonoscopy 2014 with Dr. Deatra Ina.  She is accompanied today by her husband.  She generally gets her medications through the [facility].  REVIEW OF SYSTEMS:  All non-GI ROS negative as otherwise stated in the HPI except for urinary leakage, irregular heart rate, sleeping problems, back pain, arthritis, depression, fatigue, allergies  Past Medical History:  Diagnosis Date  . Allergic rhinitis   . Anxiety   . Atrial septal aneurysm   . Basal cell carcinoma   . Bradycardia   . Cataract    bil cataracts removed  . Chronic combined systolic and diastolic CHF (congestive heart failure) ([place])   . CKD (chronic kidney disease), stage III ([place])   . Clostridium difficile infection   . COPD (chronic obstructive pulmonary disease) ([place])   . Depression 11/29/2013  . DI (detrusor instability)   . Esophageal stricture   . GERD (gastroesophageal reflux disease)   . Hemorrhoids   . Hiatal hernia   . History of kidney stones   . HLD (hyperlipidemia)   . HTN (hypertension)    . Ischemic colon ([place])    a. 08/2016 - adm to  [facility] with a ruptured colon s/p right hemicolectomy with ileostomy formation for ischemic and necrotic colon.   . Lung nodule    a. followed by pulmonology  . Mild CAD    a. minor nonobstructive CAD 08/22/16.  . Nephrolithiasis    hx  . NICM (nonischemic cardiomyopathy) ([place])   . Orthostatic hypotension   . Osteoarthritis   . PAF (paroxysmal atrial fibrillation) ([place])    a. diagnosed 07/2016 s/p DCCV 08/01/16 with recurrence, managed with rate control for now.   . Pneumonia 2013  . PVC's (premature ventricular contractions)   . Shingles since Nov 18, 2013   on right arm from shoulder to wrist  . Status post dilation of esophageal narrowing   . Stroke [facility]) 2001   STROKES, TIA'S  . Syncope    a. 08/2016 felt due to orthostatic hypotension.  Marland Kitchen TIA (transient ischemic attack) last 2001   anticoagulation therapy on coumadin    Past Surgical History:  Procedure Laterality Date  . ANTERIOR LATERAL LUMBAR FUSION 4 LEVELS Right 07/03/2015   Procedure: Right Lumbar one-two, Lumbar two-three, Lumbar three-four, Lumbar four-five Anterior lateral lumbar interbody fusion;  Surgeon: Erline Levine, MD;  Location: [place] NEURO ORS;  Service: Neurosurgery;  Laterality: Right;  Right L1-2 L2-3 L3-4 L4-5 Anterior lateral lumbar interbody fusion  . BALLOON DILATION N/A 03/03/2014   Procedure: BALLOON DILATION;  Surgeon: Inda Castle, MD;  [facility];  Service: Endoscopy;  Laterality: N/A;  . BLADDER SUSPENSION    . BREAST BIOPSY    . CARDIAC CATHETERIZATION N/A 08/22/2016   Procedure: Left Heart Cath and Coronary Angiography;  Surgeon: Peter M Martinique, MD;  Location: [place] CV LAB;  Service: Cardiovascular;  Laterality: N/A;  . CARDIOVERSION N/A 08/01/2016   Procedure: CARDIOVERSION;  Surgeon: Jerline Pain, MD;  Location: [place];  Service: Cardiovascular;  Laterality: N/A;  . CARDIOVERSION N/A 10/11/2016   Procedure:  CARDIOVERSION;  Surgeon: Sueanne Margarita, MD;  [facility];  Service: Cardiovascular;  Laterality: N/A;  . COLONOSCOPY    . ESOPHAGOGASTRODUODENOSCOPY N/A 03/03/2014   Procedure: ESOPHAGOGASTRODUODENOSCOPY (EGD);  Surgeon: Inda Castle, MD;  Location: [facility];  Service: Endoscopy;  Laterality: N/A;  . EXCISION OF BASAL CELL CA  2011  . FINGER SURGERY    . GLUTEUS MINIMUS REPAIR Right 03/15/2016   Procedure: RIGHT HIP GLUTEUS MEDIUS TENDON REPAIR;  Surgeon: Paralee Cancel, MD;  [facility];  Service: Orthopedics;  Laterality: Right;  . ileostomy reversal    . LUMBAR PERCUTANEOUS PEDICLE SCREW 4 LEVEL Right 07/03/2015   Procedure: Lumbar one-Sacral one Bilateral percutaneous pedicle screws;  Surgeon: Erline Levine, MD;  Location: [place] NEURO ORS;  Service: Neurosurgery;  Laterality: Right;  L1-S1 Bilateral percutaneous pedicle screws  . RECTOCELE REPAIR  2008   A&P REPAIR with vaginal repair-DR. MCDIARMID  . THUMB SURGERY Right 10-15 yrs ago  . UPPER GASTROINTESTINAL ENDOSCOPY    . VAGINAL HYSTERECTOMY  1983   NO BSO-DR. MABRY  . VARICOSE VEIN SURGERY      Social History BRILEY BUMGARNER  reports that she quit smoking about 25 years ago. Her smoking use included cigarettes. She started smoking about 66 years ago. She has a 126.00 pack-year smoking history. She has never used smokeless tobacco. She reports that she does not drink alcohol and does not use drugs.  family history includes Breast cancer in her maternal aunt, maternal aunt, maternal aunt, and mother; COPD in her mother; Emphysema in her mother; Uterine cancer in her mother.  Allergies  Allergen Reactions  . Clarithromycin Other (See Comments)    REACTION: possible rash but could have been legionarres dz with rash  . Lipitor [Atorvastatin] Other (See Comments)    MUSCLE SPASMS  . Simvastatin Other (See Comments)    Muscle and joint pain  . Cardizem [Diltiazem Hcl] Hives  . Contrast Media [Iodinated Diagnostic  Agents]     FYI - during admission at [facility] 08/2016 patient developed hives, question related to cardizem or contrast dye - not clear  . Crestor [Rosuvastatin Calcium]     fagitue and joint pain, "NO STATINS"  . Rosuvastatin Calcium Other (See Comments)    fagitue and joint pain, "NO STATINS"       PHYSICAL EXAMINATION: Vital signs: BP 128/70   Pulse 64   Ht 5\' 4"  (1.626 m)   Wt 130 lb 6.4 oz (59.1 kg)   SpO2 96%   BMI 22.38 kg/m   Constitutional: generally well-appearing, no acute distress Psychiatric: alert and oriented x3, cooperative Eyes: extraocular movements intact, anicteric, conjunctiva pink Mouth: oral pharynx moist, no lesions Neck: supple no lymphadenopathy Cardiovascular: heart regular rate and rhythm, no murmur Lungs: clear to auscultation bilaterally Abdomen: soft, nontender, nondistended, no obvious ascites, no peritoneal signs, normal bowel sounds, no organomegaly Rectal: Omitted Extremities: no clubbing, cyanosis, or lower extremity edema bilaterally Skin: no lesions on visible extremities Neuro: No focal deficits.  Cranial nerves intact  ASSESSMENT:  1.  Problems with diarrhea after colostomy reversal for partial bowel obstruction.  Managed with Colestid and Imodium. 2.  Episodes of urgency and nocturnal incontinence 3.  Increased intestinal gas.  Suspect bacterial overgrowth 4.  Nonspecific decrease in appetite with associated mild weight loss 5.  GERD complicated by peptic stricture.  Status post dilation.  Asymptomatic post dilation of PPI   PLAN:  1.  Continue Colestid 2 g twice daily.  Refill as needed 2.  Imodium as needed 3.  Prescribe metronidazole 250 mg 3 times daily x10 days.  Medication risks reviewed 4.  Continue PPI. 5.  Encouraged to increase caloric intake 6.  Return office follow-up 6 months Total time of 30 minutes was spent preparing to see the patient, reviewing test, obtaining comprehensive history, performing  medically appropriate physical examination, counseling the patient and her husband regarding her above listed issues, ordering medications, arranging follow-up, and documenting clinical information in the health record

## 2021-01-12 DIAGNOSIS — M19012 Primary osteoarthritis, left shoulder: Secondary | ICD-10-CM | POA: Diagnosis not present

## 2021-01-12 DIAGNOSIS — M25512 Pain in left shoulder: Secondary | ICD-10-CM | POA: Diagnosis not present

## 2021-01-12 DIAGNOSIS — M7502 Adhesive capsulitis of left shoulder: Secondary | ICD-10-CM | POA: Diagnosis not present

## 2021-01-12 DIAGNOSIS — M75122 Complete rotator cuff tear or rupture of left shoulder, not specified as traumatic: Secondary | ICD-10-CM | POA: Diagnosis not present

## 2021-01-14 ENCOUNTER — Encounter: Payer: Self-pay | Admitting: Internal Medicine

## 2021-01-16 MED ORDER — GABAPENTIN 300 MG PO CAPS: ORAL_CAPSULE | ORAL | 1 refills | Status: DC

## 2021-01-20 DIAGNOSIS — M7502 Adhesive capsulitis of left shoulder: Secondary | ICD-10-CM | POA: Diagnosis not present

## 2021-01-20 DIAGNOSIS — M19012 Primary osteoarthritis, left shoulder: Secondary | ICD-10-CM | POA: Diagnosis not present

## 2021-01-20 DIAGNOSIS — M75122 Complete rotator cuff tear or rupture of left shoulder, not specified as traumatic: Secondary | ICD-10-CM | POA: Diagnosis not present

## 2021-01-20 DIAGNOSIS — M25512 Pain in left shoulder: Secondary | ICD-10-CM | POA: Diagnosis not present

## 2021-01-26 ENCOUNTER — Other Ambulatory Visit: Payer: Self-pay

## 2021-01-26 MED ORDER — NYSTATIN 100000 UNIT/ML MT SUSP
5.0000 mL | Freq: Four times a day (QID) | OROMUCOSAL | 0 refills | Status: DC
Start: 2021-01-26 — End: 2021-02-08

## 2021-01-28 DIAGNOSIS — L814 Other melanin hyperpigmentation: Secondary | ICD-10-CM | POA: Diagnosis not present

## 2021-01-28 DIAGNOSIS — L209 Atopic dermatitis, unspecified: Secondary | ICD-10-CM | POA: Diagnosis not present

## 2021-02-04 ENCOUNTER — Encounter: Payer: Self-pay | Admitting: Internal Medicine

## 2021-02-05 ENCOUNTER — Other Ambulatory Visit: Payer: Self-pay

## 2021-02-05 MED ORDER — EZETIMIBE 10 MG PO TABS: 10.0000 mg | ORAL_TABLET | Freq: Every day | ORAL | 0 refills | Status: DC

## 2021-02-08 ENCOUNTER — Other Ambulatory Visit: Payer: Self-pay

## 2021-02-08 MED ORDER — NYSTATIN 100000 UNIT/ML MT SUSP: 5.0000 mL | Freq: Four times a day (QID) | OROMUCOSAL | 0 refills | Status: DC

## 2021-02-12 ENCOUNTER — Encounter: Payer: Self-pay | Admitting: Cardiology

## 2021-02-12 ENCOUNTER — Other Ambulatory Visit: Payer: Self-pay

## 2021-02-12 ENCOUNTER — Ambulatory Visit (INDEPENDENT_AMBULATORY_CARE_PROVIDER_SITE_OTHER): Payer: Medicare Other | Admitting: Cardiology

## 2021-02-12 VITALS — BP 106/64 | HR 75 | Ht 64.0 in | Wt 125.0 lb

## 2021-02-12 DIAGNOSIS — I480 Paroxysmal atrial fibrillation: Secondary | ICD-10-CM | POA: Diagnosis not present

## 2021-02-12 NOTE — Progress Notes (Signed)
Electrophysiology Office Note   Date:  02/12/2021   ID:  [name], [name] September 09, 1941, MRN [id number]  PCP:  Biagio Borg, MD  Primary Electrophysiologist:  Constance Haw, MD    No chief complaint on file.    History of Present Illness: [name] is a 80 y.o. female who presents today for electrophysiology evaluation.     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  She was admitted to the hospital 08/17/2016 with a ruptured colon is status post right hemicolectomy with ileostomy for an ischemic and necrotic colon.  Today, denies symptoms of palpitations, chest pain, shortness of breath, orthopnea, PND, lower extremity edema, claudication, dizziness, presyncope, syncope, bleeding, or neurologic sequela. The patient is tolerating medications without difficulties.  She is currently doing well.  She has noted no atrial fibrillation and is unaware of PVCs.  She is able do all of her daily activities.  Her only issue is that she has been losing weight.  She has had follow-up with a gastroenterologist and she has plans to call her primary physician.  Past Medical History:  Diagnosis Date  . Allergic rhinitis   . Anxiety   . Atrial septal aneurysm   . Basal cell carcinoma   . Bradycardia   . Cataract    bil cataracts removed  . Chronic combined systolic and diastolic CHF (congestive heart failure) ([place])   . CKD (chronic kidney disease), stage III ([place])   . Clostridium difficile infection   . COPD (chronic obstructive pulmonary disease) ([place])   . Depression 11/29/2013  . DI (detrusor instability)   . Esophageal stricture   . GERD (gastroesophageal reflux disease)   . Hemorrhoids   . Hiatal hernia   . History of  kidney stones   . HLD (hyperlipidemia)   . HTN (hypertension)   . Ischemic colon ([place])    a. 08/2016 - adm to  [facility] with a ruptured colon s/p right hemicolectomy with ileostomy formation for ischemic and necrotic colon.   . Lung nodule    a. followed by pulmonology  . Mild CAD    a. minor nonobstructive CAD 08/22/16.  . Nephrolithiasis    hx  . NICM (nonischemic cardiomyopathy) ([place])   . Orthostatic hypotension   . Osteoarthritis   . PAF (paroxysmal atrial fibrillation) ([place])    a. diagnosed 07/2016 s/p DCCV 08/01/16 with recurrence, managed with rate control for now.   . Pneumonia 2013  . PVC's (premature ventricular contractions)   . Shingles since Nov 18, 2013   on right arm from shoulder to wrist  . Status post dilation of esophageal narrowing   . Stroke [facility]) 2001   STROKES, TIA'S  . Syncope    a. 08/2016 felt due to orthostatic hypotension.  Marland Kitchen TIA (transient ischemic attack) last 2001   anticoagulation therapy on coumadin   Past Surgical History:  Procedure Laterality Date  . ANTERIOR LATERAL LUMBAR FUSION 4 LEVELS Right 07/03/2015   Procedure: Right Lumbar one-two, Lumbar two-three, Lumbar three-four, Lumbar four-five Anterior lateral lumbar interbody fusion;  Surgeon: Erline Levine, MD;  Location: [place] NEURO ORS;  Service: Neurosurgery;  Laterality: Right;  Right L1-2 L2-3 L3-4 L4-5 Anterior lateral lumbar interbody fusion  . BALLOON DILATION N/A 03/03/2014  Procedure: BALLOON DILATION;  Surgeon: Inda Castle, MD;  Location: [facility];  Service: Endoscopy;  Laterality: N/A;  . BLADDER SUSPENSION    . BREAST BIOPSY    . CARDIAC CATHETERIZATION N/A 08/22/2016   Procedure: Left Heart Cath and Coronary Angiography;  Surgeon: Peter M Martinique, MD;  Location: [place] CV LAB;  Service: Cardiovascular;  Laterality: N/A;  . CARDIOVERSION N/A 08/01/2016   Procedure: CARDIOVERSION;  Surgeon: Jerline Pain, MD;  Location: [place];  Service: Cardiovascular;   Laterality: N/A;  . CARDIOVERSION N/A 10/11/2016   Procedure: CARDIOVERSION;  Surgeon: Sueanne Margarita, MD;  [facility];  Service: Cardiovascular;  Laterality: N/A;  . COLONOSCOPY    . ESOPHAGOGASTRODUODENOSCOPY N/A 03/03/2014   Procedure: ESOPHAGOGASTRODUODENOSCOPY (EGD);  Surgeon: Inda Castle, MD;  Location[facility];  Service: Endoscopy;  Laterality: N/A;  . EXCISION OF BASAL CELL CA  2011  . FINGER SURGERY    . GLUTEUS MINIMUS REPAIR Right 03/15/2016   Procedure: RIGHT HIP GLUTEUS MEDIUS TENDON REPAIR;  Surgeon: Paralee Cancel, MD;  [facility];  Service: Orthopedics;  Laterality: Right;  . ileostomy reversal    . LUMBAR PERCUTANEOUS PEDICLE SCREW 4 LEVEL Right 07/03/2015   Procedure: Lumbar one-Sacral one Bilateral percutaneous pedicle screws;  Surgeon: Erline Levine, MD;  Location: [place] NEURO ORS;  Service: Neurosurgery;  Laterality: Right;  L1-S1 Bilateral percutaneous pedicle screws  . RECTOCELE REPAIR  2008   A&P REPAIR with vaginal repair-DR. MCDIARMID  . THUMB SURGERY Right 10-15 yrs ago  . UPPER GASTROINTESTINAL ENDOSCOPY    . VAGINAL HYSTERECTOMY  1983   NO BSO-DR. MABRY  . VARICOSE VEIN SURGERY       Current Outpatient Medications  Medication Sig Dispense Refill  . albuterol (VENTOLIN HFA) 108 (90 Base) MCG/ACT inhaler Inhale 1-2 puffs into the lungs every 6 (six) hours as needed for wheezing or shortness of breath. 18 g 3  . apixaban (ELIQUIS) 5 MG TABS tablet Take 1 tablet (5 mg total) by mouth 2 (two) times daily. 180 tablet 3  . azelastine (ASTELIN) 0.1 % nasal spray Place 2 sprays into the nose daily. Use in each nostril as directed 90 mL 0  . buPROPion (WELLBUTRIN XL) 300 MG 24 hr tablet Take 1 tablet (300 mg total) by mouth daily. 90 tablet 3  . Calcium Carbonate-Vitamin D (CALCIUM + D PO) Take 1 tablet by mouth 2 (two) times daily.    . colestipol (COLESTID) 1 g tablet Take 2 tablets (2 g total) by mouth 2 (two) times daily. 360 tablet 3  .  cyclobenzaprine (FLEXERIL) 5 MG tablet Take 1 tablet (5 mg total) by mouth 3 (three) times daily as needed for muscle spasms. 270 tablet 1  . ezetimibe (ZETIA) 10 MG tablet Take 1 tablet (10 mg total) by mouth daily. 90 tablet 0  . fluticasone (FLONASE) 50 MCG/ACT nasal spray Place 1 spray into both nostrils 2 (two) times daily. 48 g 1  . Fluticasone-Umeclidin-Vilant (TRELEGY ELLIPTA) 100-62.5-25 MCG/INH AEPB Inhale 1 puff into the lungs daily. I inhalation once daily 180 each 1  . furosemide (LASIX) 20 MG tablet Take 1 tablet (20 mg total) by mouth daily as needed (swelling). 90 tablet 3  . gabapentin (NEURONTIN) 300 MG capsule 1 tab by mouth in AM, 1 po at midday, then 2 by mouth at bedtime for sleep and pain 360 capsule 1  . guaiFENesin (MUCINEX) 600 MG 12 hr tablet Take 1,200 mg by mouth 2 (two) times  daily as needed for cough or to loosen phlegm.    . hydroxychloroquine (PLAQUENIL) 200 MG tablet Take 400 mg by mouth at bedtime.    Marland Kitchen ipratropium (ATROVENT) 0.03 % nasal spray Place 2 sprays into both nostrils 3 (three) times daily as needed for rhinitis. 90 mL 1  . levothyroxine (SYNTHROID) 25 MCG tablet Take 1 tablet (25 mcg total) by mouth daily before breakfast. Annual appt due in Nov must see provider for future refills 90 tablet 3  . Lidocaine HCl 2 % CREA Apply 1 application topically daily as needed (for shingles flares).    Marland Kitchen loperamide (IMODIUM) 2 MG capsule Take by mouth.    . loratadine (CLARITIN) 10 MG tablet Take 10 mg by mouth daily.    . Melatonin 5 MG TABS Take 5 mg by mouth at bedtime.    . metoprolol succinate (TOPROL-XL) 100 MG 24 hr tablet Take 1 tablet (100 mg total) by mouth daily. Take with or immediately following a meal. 90 tablet 2  . mirabegron ER (MYRBETRIQ) 50 MG TB24 tablet Take 50 mg by mouth daily.    . montelukast (SINGULAIR) 10 MG tablet Take 1 tablet (10 mg total) by mouth daily. Annual appt due in Nov must see provider for future refills 90 tablet 1  . Multiple  Vitamin (MULTIVITAMIN) tablet Take 1 tablet by mouth daily.    Marland Kitchen nystatin (MYCOSTATIN) 100000 UNIT/ML suspension Take 5 mLs (500,000 Units total) by mouth 4 (four) times daily. 60 mL 0  . NYSTATIN PO Take 5 mLs by mouth as needed.    Marland Kitchen omeprazole (PRILOSEC) 40 MG capsule Take 1 capsule (40 mg total) by mouth daily. 90 capsule 3  . Probiotic Product (PROBIOTIC DAILY PO) Take by mouth in the morning and at bedtime.     . traZODone (DESYREL) 50 MG tablet Take 1 tablet (50 mg total) by mouth at bedtime and may repeat dose one time if needed. 180 tablet 1   No current facility-administered medications for this visit.    Allergies:   Clarithromycin, Lipitor [atorvastatin], Simvastatin, Cardizem [diltiazem hcl], Contrast media [iodinated diagnostic agents], Crestor [rosuvastatin calcium], and Rosuvastatin calcium   Social History:  The patient  reports that she quit smoking about 25 years ago. Her smoking use included cigarettes. She started smoking about 66 years ago. She has a 126.00 pack-year smoking history. She has never used smokeless tobacco. She reports that she does not drink alcohol and does not use drugs.   Family History:  The patient's family history includes Breast cancer in her maternal aunt, maternal aunt, maternal aunt, and mother; COPD in her mother; Emphysema in her mother; Uterine cancer in her mother.   ROS:  Please see the history of present illness.   Otherwise, review of systems is positive for none.   All other systems are reviewed and negative.   PHYSICAL EXAM: VS:  BP 106/64   Pulse 75   Ht 5\' 4"  (1.626 m)   Wt 125 lb (56.7 kg)   SpO2 97%   BMI 21.46 kg/m  , BMI Body mass index is 21.46 kg/m. GEN: Well nourished, well developed, in no acute distress  HEENT: normal  Neck: no JVD, carotid bruits, or masses Cardiac: RRR; no murmurs, rubs, or gallops,no edema  Respiratory:  clear to auscultation bilaterally, normal work of breathing GI: soft, nontender, nondistended, +  BS MS: no deformity or atrophy  Skin: warm and dry Neuro:  Strength and sensation are intact Psych: euthymic mood,  full affect  EKG:  EKG is ordered today. Personal review of the ekg ordered shows sinus rhythm, PVCs  Recent Labs: No results found for requested labs within last 8760 hours.    Lipid Panel     Component Value Date/Time   CHOL 161 10/29/2019 1108   TRIG 175.0 (H) 10/29/2019 1108   HDL 55.70 10/29/2019 1108   CHOLHDL 3 10/29/2019 1108   VLDL 35.0 10/29/2019 1108   LDLCALC 70 10/29/2019 1108   LDLDIRECT 133.0 10/17/2018 1420     Wt Readings from Last 3 Encounters:  02/12/21 125 lb (56.7 kg)  01/11/21 130 lb 6.4 oz (59.1 kg)  11/17/20 130 lb (59 kg)      Other studies Reviewed: Additional studies/ records that were reviewed today include: TTE 04/05/17, Cath 08/22/16, Telemetry 06/14/16  Review of the above records today demonstrates:  TTE - Left ventricle: The cavity size was normal. Wall thickness was   normal. Systolic function was mildly reduced. The estimated   ejection fraction was in the range of 45% to 50%. Diffuse   hypokinesis. Doppler parameters are consistent with abnormal left   ventricular relaxation (grade 1 diastolic dysfunction). Doppler   parameters are consistent with high ventricular filling pressure. - Aortic valve: There was mild stenosis. - Mitral valve: There was moderate regurgitation. - Left atrium: The atrium was mildly dilated. - Pulmonary arteries: Systolic pressure was mildly increased. PA   peak pressure: 40 mm Hg (S).   Cath  Prox LAD to Mid LAD lesion, 15 %stenosed.  Ost Cx to Prox Cx lesion, 15 %stenosed.  Prox RCA to Mid RCA lesion, 20 %stenosed.  There is mild left ventricular systolic dysfunction.  LV end diastolic pressure is normal.  The left ventricular ejection fraction is 45-50% by visual estimate.   1. Minor nonobstructive CAD 2. Mild LV dysfunction with distal inferior HK 3. Measurement of LV pressures  limited by frequent PVCs but EDP appears normal.   Telemetry 1. NSR with PVC's, at times in a bigeminal manner 2. Signal drop out as well as noise artifact are present.  3. No sustained atrial or ventricular arrhythmias 4. No pauses   ASSESSMENT AND PLAN:  1.  Paroxysmal atrial fibrillation: Currently on Eliquis and Toprol-XL.  CHA2DS2-VASc of 5.  She remains in sinus rhythm.  No changes.   2.  Chronic systolic heart failure: Currently on Toprol-XL.  No obvious volume overload.  No changes.    3.  Hypertension: Currently well controlled  4.  PVCs: Found on cardiac monitor.  Having PVCs today but asymptomatic.  No changes.   Current medicines are reviewed at length with the patient today.   The patient does not have concerns regarding her medicines.  The following changes were made today: None  Labs/ tests ordered today include:  Orders Placed This Encounter  Procedures  . EKG 12-Lead     Disposition:   FU with Arisbel Maione 6 months  Signed, Kristoph Sattler Meredith Leeds, MD  02/12/2021 2:26 PM     [place] [facility] [place] [address] [phone] (office) [phone] (fax)

## 2021-02-12 NOTE — Patient Instructions (Signed)
Medication Instructions:  Your physician recommends that you continue on your current medications as directed. Please refer to the Current Medication list given to you today.  *If you need a refill on your cardiac medications before your next appointment, please call your pharmacy*   Lab Work: None ordered  Testing/Procedures: None ordered   Follow-Up: At [facility] [place] [facility], you and your health needs are our priority.  As part of our continuing mission to provide you with exceptional heart care, we have created designated Provider Care Teams.  These Care Teams include your primary Cardiologist (physician) and Advanced Practice Providers (APPs -  Physician Assistants and Nurse Practitioners) who all work together to provide you with the care you need, when you need it.   Your next appointment:   6 month(s)  The format for your next appointment:   In Person  Provider:   You may see one of the following Advanced Practice Providers on your designated Care Team:    Chanetta Marshall, NP  Tommye Standard, PA-C  Legrand Como "[place]" [place], [place]     Thank you for choosing [facility]!!   [name], RN [phone]

## 2021-02-15 ENCOUNTER — Encounter: Payer: Self-pay | Admitting: Internal Medicine

## 2021-02-15 DIAGNOSIS — E46 Unspecified protein-calorie malnutrition: Secondary | ICD-10-CM

## 2021-02-15 DIAGNOSIS — I10 Essential (primary) hypertension: Secondary | ICD-10-CM

## 2021-02-15 DIAGNOSIS — R739 Hyperglycemia, unspecified: Secondary | ICD-10-CM

## 2021-02-15 DIAGNOSIS — E7800 Pure hypercholesterolemia, unspecified: Secondary | ICD-10-CM

## 2021-02-17 ENCOUNTER — Telehealth: Payer: Self-pay | Admitting: Internal Medicine

## 2021-02-17 DIAGNOSIS — N183 Chronic kidney disease, stage 3 unspecified: Secondary | ICD-10-CM

## 2021-02-17 NOTE — Telephone Encounter (Signed)
Pamala Hurry w/ [facility] called and said that Medicare will not cover the patients visit. She said if the patient has Chronic Kidney Disease needs to be listed as primary for diagnosis and the referral needs to be amended. If she does not have Chronic Kidney Disease then she will have to pay out of pocket for the visit. She is scheduled for 03/18/21. Please advise    Phone: [phone].

## 2021-02-17 NOTE — Telephone Encounter (Signed)
Ok this is changed 

## 2021-02-18 NOTE — Telephone Encounter (Signed)
Notified that the requested change was made.  May call back if further assistance needed.

## 2021-03-18 ENCOUNTER — Other Ambulatory Visit: Payer: Self-pay

## 2021-03-18 ENCOUNTER — Encounter: Payer: Medicare Other | Attending: Internal Medicine | Admitting: Skilled Nursing Facility1

## 2021-03-18 DIAGNOSIS — N183 Chronic kidney disease, stage 3 unspecified: Secondary | ICD-10-CM | POA: Diagnosis not present

## 2021-03-18 DIAGNOSIS — I509 Heart failure, unspecified: Secondary | ICD-10-CM | POA: Diagnosis not present

## 2021-03-18 NOTE — Progress Notes (Signed)
Medical Nutrition Therapy  Appointment Start time:  9:32 am  Appointment End time:  10:40 am  Primary concerns today: Pt reports diarrhea, weight loss of 1 lb a month for a year, no appetite, trouble swallowing Referral diagnosis: stage 3 CKD Preferred learning style: hands on, visual, auditory (auditory, visual, hands on, no preference indicated) Learning readiness: ready (not ready, contemplating, ready, change in progress)   NUTRITION ASSESSMENT   Anthropometrics  Weight:  126 lbs BMI:  21.77 kg/m2   Clinical Medical Hx: ileostomy 08/24/16, lumbar fusion 07/2015, a-fib 2017, CHF, COPD, GERD, HLD, HTN, CKD3 Medications: albuterol, eliquis, astelin, wellbutrin, calcium + vitamin D, zetia, lasix, levothyroxin, prilosec, neurontin, probiotic,  Labs:  Notable Signs/Symptoms: feeling well for the last 3 weeks with no diarrhea  Lifestyle & Dietary Hx Pt has been forcing herself to eat breakfast for the past few weeks in efforts to maintain her weight. Pt reports her typical adulthood weight is 140 lb. She avoids salads, chocolate, most all raw vegetables,  diarrhea after eating. Pt states whey protein and ensure hurts her stomach.  She reports taking calcium and levothyroxine at the same time. Pt reports trouble swallowing some things, "it just comes back up"   Estimated daily fluid intake: 24 fl oz water + 16 fl oz coffee + 8 fl oz coke = 48 fl oz total Supplements: calcium + vitamin D, multivitamin, probiotic Sleep:  Stress / self-care: stressed due to health issues Current average weekly physical activity:   24-Hr Dietary Recall First Meal: oatmeal with brown sugar Snack: none Second Meal: corn dog  Snack: none Third Meal: small amount of egg, part of pancakes with blackberries and syrup Snack: none Beverages: 8 fl oz regular coke (small size as she has to limit caffeine), mostly water, decaf coffee  Estimated Energy Needs Calories: 1600   NUTRITION INTERVENTION   Nutrition education (E-1) on the following topics:  -This RD made a medication schedule and also put in alarms in pt's smart phone about what to take and when advised to have physcian or pharmacist to check this schedule for accuracy  -Nutrition for weight gain (numerous ideas provided verbally and written, tailored to pt preference and tolerability)    Handouts Provided Include  -Medication schedule (printed, 2 copies) -Ensure plant based, [employer] glucose support, Glucerna with instructions to drink 1/2 of one between meals for total intake of 1 per day, and to refrigerate  Learning Style & Readiness for Change Teaching method utilized: Visual & Auditory  Demonstrated degree of understanding via: Teach Back  Barriers to learning/adherence to lifestyle change: none identified   Goals Established by Pt . Avoid concentrated sugars like regular pancake syrup . Add snacks to help increase overall intake . Share medication schedule with pharmacist and/or doctor for review . Chew food slowly and thoroughly . Add peanut butter and mayo in the form of peanut butter or mayo & banana sandwich . Add cheese, nuts to meals or as snacks . Eat 4 times per day . Try dark chocolate . Avoid eating out due to over consumption of salt and fluid imbalance from CHF   MONITORING & EVALUATION Dietary intake, weekly physical activity  Next Steps  Patient is to add higher fat foods to meals, eat 4 times per day in the form of three snacks and at least 1 snack. Pt to email which supplement drink she likes best, in consideration of possible insurance coverage.

## 2021-03-22 ENCOUNTER — Telehealth: Payer: Self-pay | Admitting: Skilled Nursing Facility1

## 2021-03-22 NOTE — Telephone Encounter (Signed)
Called pt to identify if she liked any of the supplements and to recomend alkaline water if it still regurgitates.    LVM

## 2021-03-23 ENCOUNTER — Telehealth: Payer: Self-pay | Admitting: Skilled Nursing Facility1

## 2021-03-23 NOTE — Telephone Encounter (Signed)
Pt states she tried the [employer] shake which did not bother her stomach and really liked it.   Dietitian advised she will see if her insurance company will cover it.

## 2021-03-25 ENCOUNTER — Encounter: Payer: Self-pay | Admitting: Internal Medicine

## 2021-03-25 MED ORDER — FUROSEMIDE 20 MG PO TABS: 20.0000 mg | ORAL_TABLET | Freq: Every day | ORAL | 1 refills | Status: DC | PRN

## 2021-03-25 MED ORDER — EZETIMIBE 10 MG PO TABS: 10.0000 mg | ORAL_TABLET | Freq: Every day | ORAL | 1 refills | Status: DC

## 2021-04-08 ENCOUNTER — Encounter: Payer: Self-pay | Admitting: Internal Medicine

## 2021-04-09 MED ORDER — TRAZODONE HCL 50 MG PO TABS: 50.0000 mg | ORAL_TABLET | Freq: Every evening | ORAL | 1 refills | Status: DC | PRN

## 2021-04-15 ENCOUNTER — Other Ambulatory Visit: Payer: Self-pay | Admitting: Obstetrics & Gynecology

## 2021-04-15 DIAGNOSIS — Z1231 Encounter for screening mammogram for malignant neoplasm of breast: Secondary | ICD-10-CM

## 2021-04-19 ENCOUNTER — Other Ambulatory Visit: Payer: Self-pay | Admitting: Nephrology

## 2021-04-19 DIAGNOSIS — N1832 Chronic kidney disease, stage 3b: Secondary | ICD-10-CM

## 2021-04-26 ENCOUNTER — Ambulatory Visit
Admission: RE | Admit: 2021-04-26 | Discharge: 2021-04-26 | Disposition: A | Discharge status: Home / Self Care | Payer: Medicare Other | Source: Ambulatory Visit | Attending: Nephrology | Admitting: Nephrology

## 2021-04-26 DIAGNOSIS — N1832 Chronic kidney disease, stage 3b: Secondary | ICD-10-CM

## 2021-04-26 DIAGNOSIS — N200 Calculus of kidney: Secondary | ICD-10-CM | POA: Diagnosis not present

## 2021-05-13 ENCOUNTER — Other Ambulatory Visit: Payer: Self-pay

## 2021-05-13 ENCOUNTER — Encounter: Payer: Self-pay | Admitting: Internal Medicine

## 2021-05-13 ENCOUNTER — Other Ambulatory Visit (INDEPENDENT_AMBULATORY_CARE_PROVIDER_SITE_OTHER): Payer: Medicare Other

## 2021-05-13 DIAGNOSIS — E559 Vitamin D deficiency, unspecified: Secondary | ICD-10-CM

## 2021-05-13 DIAGNOSIS — R739 Hyperglycemia, unspecified: Secondary | ICD-10-CM | POA: Diagnosis not present

## 2021-05-13 DIAGNOSIS — E538 Deficiency of other specified B group vitamins: Secondary | ICD-10-CM | POA: Diagnosis not present

## 2021-05-13 DIAGNOSIS — E7800 Pure hypercholesterolemia, unspecified: Secondary | ICD-10-CM | POA: Diagnosis not present

## 2021-05-13 LAB — CBC WITH DIFFERENTIAL/PLATELET
Basophils Absolute: 0.1 10*3/uL (ref 0.0–0.1)
Basophils Relative: 1.1 % (ref 0.0–3.0)
Eosinophils Absolute: 0.1 10*3/uL (ref 0.0–0.7)
Eosinophils Relative: 2.4 % (ref 0.0–5.0)
HCT: 37.3 % (ref 36.0–46.0)
Hemoglobin: 12.5 g/dL (ref 12.0–15.0)
Lymphocytes Relative: 28.7 % (ref 12.0–46.0)
Lymphs Abs: 1.4 10*3/uL (ref 0.7–4.0)
MCHC: 33.6 g/dL (ref 30.0–36.0)
MCV: 82.6 fl (ref 78.0–100.0)
Monocytes Absolute: 0.9 10*3/uL (ref 0.1–1.0)
Monocytes Relative: 17.6 % — ABNORMAL HIGH (ref 3.0–12.0)
Neutro Abs: 2.5 10*3/uL (ref 1.4–7.7)
Neutrophils Relative %: 50.2 % (ref 43.0–77.0)
Platelets: 159 10*3/uL (ref 150.0–400.0)
RBC: 4.51 Mil/uL (ref 3.87–5.11)
RDW: 14.8 % (ref 11.5–15.5)
WBC: 4.9 10*3/uL (ref 4.0–10.5)

## 2021-05-13 LAB — BASIC METABOLIC PANEL
BUN: 20 mg/dL (ref 6–23)
CO2: 29 mEq/L (ref 19–32)
Calcium: 9.2 mg/dL (ref 8.4–10.5)
Chloride: 105 mEq/L (ref 96–112)
Creatinine, Ser: 1.3 mg/dL — ABNORMAL HIGH (ref 0.40–1.20)
GFR: 38.49 mL/min — ABNORMAL LOW (ref >60.00)
Glucose, Bld: 73 mg/dL (ref 70–99)
Potassium: 4.5 mEq/L (ref 3.5–5.1)
Sodium: 140 mEq/L (ref 135–145)

## 2021-05-13 LAB — TSH: TSH: 2.1 u[IU]/mL (ref 0.35–4.50)

## 2021-05-13 LAB — LIPID PANEL
Cholesterol: 143 mg/dL (ref 0–200)
HDL: 59.2 mg/dL (ref >39.00)
LDL Cholesterol: 56 mg/dL (ref 0–99)
NonHDL: 83.95
Total CHOL/HDL Ratio: 2
Triglycerides: 141 mg/dL (ref 0.0–149.0)
VLDL: 28.2 mg/dL (ref 0.0–40.0)

## 2021-05-13 LAB — HEPATIC FUNCTION PANEL
ALT: 15 U/L (ref 0–35)
AST: 22 U/L (ref 0–37)
Albumin: 3.9 g/dL (ref 3.5–5.2)
Alkaline Phosphatase: 83 U/L (ref 39–117)
Bilirubin, Direct: 0.1 mg/dL (ref 0.0–0.3)
Total Bilirubin: 0.5 mg/dL (ref 0.2–1.2)
Total Protein: 6.2 g/dL (ref 6.0–8.3)

## 2021-05-13 LAB — HEMOGLOBIN A1C: Hgb A1c MFr Bld: 5.5 % (ref 4.6–6.5)

## 2021-05-13 LAB — VITAMIN D 25 HYDROXY (VIT D DEFICIENCY, FRACTURES): VITD: 47.91 ng/mL (ref 30.00–100.00)

## 2021-05-13 LAB — VITAMIN B12: Vitamin B-12: 1085 pg/mL — ABNORMAL HIGH (ref 211–911)

## 2021-05-14 ENCOUNTER — Other Ambulatory Visit: Payer: Self-pay

## 2021-05-14 MED ORDER — MONTELUKAST SODIUM 10 MG PO TABS: 10.0000 mg | ORAL_TABLET | Freq: Every day | ORAL | 1 refills | Status: DC

## 2021-05-18 ENCOUNTER — Ambulatory Visit: Payer: Medicare Other | Admitting: Internal Medicine

## 2021-05-20 ENCOUNTER — Ambulatory Visit (INDEPENDENT_AMBULATORY_CARE_PROVIDER_SITE_OTHER): Payer: Medicare Other | Admitting: Internal Medicine

## 2021-05-20 ENCOUNTER — Other Ambulatory Visit: Payer: Self-pay

## 2021-05-20 ENCOUNTER — Encounter: Payer: Self-pay | Admitting: Internal Medicine

## 2021-05-20 VITALS — BP 128/74 | HR 65 | Temp 98.0°F | Ht 64.0 in | Wt 124.0 lb

## 2021-05-20 DIAGNOSIS — R220 Localized swelling, mass and lump, head: Secondary | ICD-10-CM | POA: Diagnosis not present

## 2021-05-20 DIAGNOSIS — R739 Hyperglycemia, unspecified: Secondary | ICD-10-CM

## 2021-05-20 DIAGNOSIS — I10 Essential (primary) hypertension: Secondary | ICD-10-CM

## 2021-05-20 DIAGNOSIS — E7800 Pure hypercholesterolemia, unspecified: Secondary | ICD-10-CM

## 2021-05-20 MED ORDER — AMOXICILLIN 500 MG PO CAPS: 1000.0000 mg | ORAL_CAPSULE | Freq: Two times a day (BID) | ORAL | 0 refills | Status: AC

## 2021-05-20 NOTE — Patient Instructions (Signed)
Please take all new medication as prescribed - the antibiotic  You will be contacted regarding the referral for: ENT  -  Dr Lucia Gaskins  Please continue all other medications as before, and refills have been done if requested.  Please have the pharmacy call with any other refills you may need.  Please continue your efforts at being more active, low cholesterol diet, and weight control.  Please keep your appointments with your specialists as you may have planned  Please make an Appointment to return in 6 months, or sooner if needed

## 2021-05-20 NOTE — Progress Notes (Signed)
Patient ID: [name], female   DOB: 02-Oct-1941, 80 y.o.   MRN: [id number]        Chief Complaint: left cheek mass       HPI:  [name] is a 80 y.o. female here with 1 wk onset new mild tender sudden left mid cheek mass, without skin change, denies sinus pain or congestion or nasal drainage or fever.  Nothing else makes better or worse.   Pt denies chest pain, increased sob or doe, wheezing, orthopnea, PND, increased LE swelling, palpitations, dizziness or syncope.   Pt denies polydipsia, polyuria, or new focal neuro s/s.         Wt Readings from Last 3 Encounters:  05/20/21 124 lb (56.2 kg)  03/18/21 126 lb 12.8 oz (57.5 kg)  02/12/21 125 lb (56.7 kg)   BP Readings from Last 3 Encounters:  05/20/21 128/74  02/12/21 106/64  01/11/21 128/70         Past Medical History:  Diagnosis Date   Allergic rhinitis    Anxiety    Atrial septal aneurysm    Basal cell carcinoma    Bradycardia    Cataract    bil cataracts removed   Chronic combined systolic and diastolic CHF (congestive heart failure) (HCC)    CKD (chronic kidney disease), stage III (HCC)    Clostridium difficile infection    COPD (chronic obstructive pulmonary disease) ([place])    Depression 11/29/2013   DI (detrusor instability)    Esophageal stricture    GERD (gastroesophageal reflux disease)    Hemorrhoids    Hiatal hernia    History of kidney stones    HLD (hyperlipidemia)    HTN (hypertension)    Ischemic colon ([place])    a. 08/2016 - adm to  [facility] with a ruptured colon s/p right hemicolectomy with ileostomy formation for ischemic and necrotic colon.    Lung nodule    a. followed by pulmonology   Mild CAD    a. minor nonobstructive CAD 08/22/16.   Nephrolithiasis    hx   NICM (nonischemic cardiomyopathy) (HCC)    Orthostatic hypotension    Osteoarthritis    PAF (paroxysmal atrial fibrillation) ([place])    a. diagnosed 07/2016 s/p DCCV 08/01/16 with recurrence, managed with rate control for now.     Pneumonia 2013   PVC's (premature ventricular contractions)    Shingles since Nov 18, 2013   on right arm from shoulder to wrist   Status post dilation of esophageal narrowing    Stroke [facility]) 2001   STROKES, TIA'S   Syncope    a. 08/2016 felt due to orthostatic hypotension.   TIA (transient ischemic attack) last 2001   anticoagulation therapy on coumadin   Past Surgical History:  Procedure Laterality Date   ANTERIOR LATERAL LUMBAR FUSION 4 LEVELS Right 07/03/2015   Procedure: Right Lumbar one-two, Lumbar two-three, Lumbar three-four, Lumbar four-five Anterior lateral lumbar interbody fusion;  Surgeon: Erline Levine, MD;  Location: [facility];  Service: Neurosurgery;  Laterality: Right;  Right L1-2 L2-3 L3-4 L4-5 Anterior lateral lumbar interbody fusion   BALLOON DILATION N/A 03/03/2014   Procedure: BALLOON DILATION;  Surgeon: Inda Castle, MD;  Location: [facility];  Service: Endoscopy;  Laterality: N/A;   BLADDER SUSPENSION     BREAST BIOPSY     CARDIAC CATHETERIZATION N/A 08/22/2016   Procedure: Left Heart Cath and Coronary Angiography;  Surgeon: Peter M Martinique, MD;  Location: [facility];  Service:  Cardiovascular;  Laterality: N/A;   CARDIOVERSION N/A 08/01/2016   Procedure: CARDIOVERSION;  Surgeon: Jerline Pain, MD;  Location: [facility];  Service: Cardiovascular;  Laterality: N/A;   CARDIOVERSION N/A 10/11/2016   Procedure: CARDIOVERSION;  Surgeon: Sueanne Margarita, MD;  Location: [facility];  Service: Cardiovascular;  Laterality: N/A;   COLONOSCOPY     ESOPHAGOGASTRODUODENOSCOPY N/A 03/03/2014   Procedure: ESOPHAGOGASTRODUODENOSCOPY (EGD);  Surgeon: Inda Castle, MD;  Location: [facility];  Service: Endoscopy;  Laterality: N/A;   EXCISION OF BASAL CELL CA  2011   FINGER SURGERY     GLUTEUS MINIMUS REPAIR Right 03/15/2016   Procedure: RIGHT HIP GLUTEUS MEDIUS TENDON REPAIR;  Surgeon: Paralee Cancel, MD;  Location: [facility];  Service: Orthopedics;  Laterality: Right;    ileostomy reversal     LUMBAR PERCUTANEOUS PEDICLE SCREW 4 LEVEL Right 07/03/2015   Procedure: Lumbar one-Sacral one Bilateral percutaneous pedicle screws;  Surgeon: Erline Levine, MD;  Location: [facility];  Service: Neurosurgery;  Laterality: Right;  L1-S1 Bilateral percutaneous pedicle screws   RECTOCELE REPAIR  2008   A&P REPAIR with vaginal repair-DR. MCDIARMID   THUMB SURGERY Right 10-15 yrs ago   UPPER GASTROINTESTINAL ENDOSCOPY     VAGINAL HYSTERECTOMY  1983   NO BSO-DR. MABRY   VARICOSE VEIN SURGERY      reports that she quit smoking about 25 years ago. Her smoking use included cigarettes. She started smoking about 66 years ago. She has a 126.00 pack-year smoking history. She has never used smokeless tobacco. She reports that she does not drink alcohol and does not use drugs. family history includes Breast cancer in her maternal aunt, maternal aunt, maternal aunt, and mother; COPD in her mother; Emphysema in her mother; Uterine cancer in her mother. Allergies  Allergen Reactions   Clarithromycin Other (See Comments)    REACTION: possible rash but could have been legionarres dz with rash   Lipitor [Atorvastatin] Other (See Comments)    MUSCLE SPASMS   Simvastatin Other (See Comments)    Muscle and joint pain   Cardizem [Diltiazem Hcl] Hives   Contrast Media [Iodinated Diagnostic Agents]     FYI - during admission at [facility] 08/2016 patient developed hives, question related to cardizem or contrast dye - not clear   Crestor [Rosuvastatin Calcium]     fagitue and joint pain, "NO STATINS"   Rosuvastatin Calcium Other (See Comments)    fagitue and joint pain, "NO STATINS"   Current Outpatient Medications on File Prior to Visit  Medication Sig Dispense Refill   albuterol (VENTOLIN HFA) 108 (90 Base) MCG/ACT inhaler Inhale 1-2 puffs into the lungs every 6 (six) hours as needed for wheezing or shortness of breath. 18 g 3   apixaban (ELIQUIS) 5 MG TABS tablet Take 1 tablet  (5 mg total) by mouth 2 (two) times daily. 180 tablet 3   azelastine (ASTELIN) 0.1 % nasal spray Place 2 sprays into the nose daily. Use in each nostril as directed 90 mL 0   buPROPion (WELLBUTRIN XL) 300 MG 24 hr tablet Take 1 tablet (300 mg total) by mouth daily. 90 tablet 3   Calcium Carbonate-Vitamin D (CALCIUM + D PO) Take 1 tablet by mouth 2 (two) times daily.     cyclobenzaprine (FLEXERIL) 5 MG tablet Take 1 tablet (5 mg total) by mouth 3 (three) times daily as needed for muscle spasms. 270 tablet 1   ezetimibe (ZETIA) 10 MG tablet Take 1 tablet (10 mg total) by  mouth daily. 90 tablet 1   fluticasone (FLONASE) 50 MCG/ACT nasal spray Place 1 spray into both nostrils 2 (two) times daily. 48 g 1   Fluticasone-Umeclidin-Vilant (TRELEGY ELLIPTA) 100-62.5-25 MCG/INH AEPB Inhale 1 puff into the lungs daily. I inhalation once daily 180 each 1   furosemide (LASIX) 20 MG tablet Take 1 tablet (20 mg total) by mouth daily as needed (swelling). 90 tablet 1   gabapentin (NEURONTIN) 300 MG capsule 1 tab by mouth in AM, 1 po at midday, then 2 by mouth at bedtime for sleep and pain 360 capsule 1   guaiFENesin (MUCINEX) 600 MG 12 hr tablet Take 1,200 mg by mouth 2 (two) times daily as needed for cough or to loosen phlegm.     hydroxychloroquine (PLAQUENIL) 200 MG tablet Take 400 mg by mouth at bedtime.     ipratropium (ATROVENT) 0.03 % nasal spray Place 2 sprays into both nostrils 3 (three) times daily as needed for rhinitis. 90 mL 1   levothyroxine (SYNTHROID) 25 MCG tablet Take 1 tablet (25 mcg total) by mouth daily before breakfast. Annual appt due in Nov must see provider for future refills 90 tablet 3   Lidocaine HCl 2 % CREA Apply 1 application topically daily as needed (for shingles flares).     loperamide (IMODIUM) 2 MG capsule Take by mouth.     loratadine (CLARITIN) 10 MG tablet Take 10 mg by mouth daily.     Melatonin 5 MG TABS Take 5 mg by mouth at bedtime.     metoprolol succinate (TOPROL-XL) 100  MG 24 hr tablet Take 1 tablet (100 mg total) by mouth daily. Take with or immediately following a meal. 90 tablet 2   mirabegron ER (MYRBETRIQ) 50 MG TB24 tablet Take 50 mg by mouth daily.     montelukast (SINGULAIR) 10 MG tablet Take 1 tablet (10 mg total) by mouth daily. Annual appt due in Nov must see provider for future refills 90 tablet 1   Multiple Vitamin (MULTIVITAMIN) tablet Take 1 tablet by mouth daily.     nystatin (MYCOSTATIN) 100000 UNIT/ML suspension Take 5 mLs (500,000 Units total) by mouth 4 (four) times daily. 60 mL 0   NYSTATIN PO Take 5 mLs by mouth as needed.     omeprazole (PRILOSEC) 40 MG capsule Take 1 capsule (40 mg total) by mouth daily. 90 capsule 3   Probiotic Product (PROBIOTIC DAILY PO) Take by mouth in the morning and at bedtime.      traZODone (DESYREL) 50 MG tablet Take 1 tablet (50 mg total) by mouth at bedtime and may repeat dose one time if needed. 180 tablet 1   colestipol (COLESTID) 1 g tablet Take 2 tablets (2 g total) by mouth 2 (two) times daily. 360 tablet 3   No current facility-administered medications on file prior to visit.        ROS:  All others reviewed and negative.  Objective        PE:  BP 128/74 (BP Location: Left Arm, Patient Position: Sitting, Cuff Size: Normal)   Pulse 65   Temp 98 F (36.7 C) (Oral)   Ht 5\' 4"  (1.626 m)   Wt 124 lb (56.2 kg)   SpO2 95%   BMI 21.28 kg/m                 Constitutional: Pt appears in NAD               HENT: Head: NCAT.  Right Ear: External ear normal.                 Left Ear: External ear normal.                Eyes: . Pupils are equal, round, and reactive to light. Conjunctivae and EOM are normal               Nose: without d/c or deformity               Neck: Neck supple. Gross normal ROM               Cardiovascular: Normal rate and regular rhythm.                 Pulmonary/Chest: Effort normal and breath sounds without rales or wheezing.                Left mid cheek with subq  mild tender 1/2 cm cystic feeling type mass               Neurological: Pt is alert. At baseline orientation, motor grossly intact               Skin: Skin is warm. No rashes, no other new lesions, LE edema - none               Psychiatric: Pt behavior is normal without agitation   Micro: none  Cardiac tracings I have personally interpreted today:  none  Pertinent Radiological findings (summarize): none   Lab Results  Component Value Date   WBC 4.9 05/13/2021   HGB 12.5 05/13/2021   HCT 37.3 05/13/2021   PLT 159.0 05/13/2021   GLUCOSE 73 05/13/2021   CHOL 143 05/13/2021   TRIG 141.0 05/13/2021   HDL 59.20 05/13/2021   LDLDIRECT 133.0 10/17/2018   LDLCALC 56 05/13/2021   ALT 15 05/13/2021   AST 22 05/13/2021   NA 140 05/13/2021   K 4.5 05/13/2021   CL 105 05/13/2021   CREATININE 1.30 (H) 05/13/2021   BUN 20 05/13/2021   CO2 29 05/13/2021   TSH 2.10 05/13/2021   INR 1.21 08/17/2016   HGBA1C 5.5 05/13/2021   Assessment/Plan:  ELLSIE VIOLETTE is a 80 y.o. White or Caucasian [1] female with  has a past medical history of Allergic rhinitis, Anxiety, Atrial septal aneurysm, Basal cell carcinoma, Bradycardia, Cataract, Chronic combined systolic and diastolic CHF (congestive heart failure) ([place]), CKD (chronic kidney disease), stage III ([place]), Clostridium difficile infection, COPD (chronic obstructive pulmonary disease) ([place]), Depression (11/29/2013), DI (detrusor instability), Esophageal stricture, GERD (gastroesophageal reflux disease), Hemorrhoids, Hiatal hernia, History of kidney stones, HLD (hyperlipidemia), HTN (hypertension), Ischemic colon ([place]), Lung nodule, Mild CAD, Nephrolithiasis, NICM (nonischemic cardiomyopathy) ([place]), Orthostatic hypotension, Osteoarthritis, PAF (paroxysmal atrial fibrillation) ([place]), Pneumonia (2013), PVC's (premature ventricular contractions), Shingles (since Nov 18, 2013), Status post dilation of esophageal narrowing, Stroke ([place]) (2001), Syncope, and TIA  (transient ischemic attack) (last 2001).  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  Cathlean Cower, MD 05/23/2021 7:51 PM [facility]

## 2021-05-21 ENCOUNTER — Encounter: Payer: Self-pay | Admitting: Internal Medicine

## 2021-05-21 ENCOUNTER — Telehealth: Payer: Self-pay | Admitting: Internal Medicine

## 2021-05-21 NOTE — Telephone Encounter (Signed)
    Patient calling to request ENT referral be sent to different provider. She states Dr Lucia Gaskins does not take [facility]  Requesting referral be sent to different ENT

## 2021-05-23 ENCOUNTER — Encounter: Payer: Self-pay | Admitting: Internal Medicine

## 2021-05-23 NOTE — Assessment & Plan Note (Signed)
BP Readings from Last 3 Encounters:  05/20/21 128/74  02/12/21 106/64  01/11/21 128/70   Stable, pt to continue medical treatment - toprol

## 2021-05-23 NOTE — Assessment & Plan Note (Signed)
Lab Results  Component Value Date   LDLCALC 56 05/13/2021   Stable, pt to continue current zetia

## 2021-05-23 NOTE — Assessment & Plan Note (Signed)
Lab Results  Component Value Date   HGBA1C 5.5 05/13/2021   Stable, pt to continue current medical treatment  - diet

## 2021-05-23 NOTE — Assessment & Plan Note (Signed)
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

## 2021-05-26 ENCOUNTER — Encounter: Payer: Self-pay | Admitting: Internal Medicine

## 2021-05-26 NOTE — Telephone Encounter (Signed)
[name] to see note please

## 2021-05-27 ENCOUNTER — Encounter: Payer: Self-pay | Admitting: Internal Medicine

## 2021-05-28 ENCOUNTER — Ambulatory Visit (INDEPENDENT_AMBULATORY_CARE_PROVIDER_SITE_OTHER): Payer: Medicare Other

## 2021-05-28 ENCOUNTER — Other Ambulatory Visit: Payer: Self-pay

## 2021-05-28 ENCOUNTER — Encounter: Payer: Self-pay | Admitting: Sports Medicine

## 2021-05-28 ENCOUNTER — Ambulatory Visit (INDEPENDENT_AMBULATORY_CARE_PROVIDER_SITE_OTHER): Payer: Medicare Other | Admitting: Sports Medicine

## 2021-05-28 DIAGNOSIS — M79672 Pain in left foot: Secondary | ICD-10-CM

## 2021-05-28 DIAGNOSIS — L909 Atrophic disorder of skin, unspecified: Secondary | ICD-10-CM

## 2021-05-28 DIAGNOSIS — K2100 Gastro-esophageal reflux disease with esophagitis, without bleeding: Secondary | ICD-10-CM

## 2021-05-28 DIAGNOSIS — M2062 Acquired deformities of toe(s), unspecified, left foot: Secondary | ICD-10-CM | POA: Diagnosis not present

## 2021-05-28 DIAGNOSIS — M1990 Unspecified osteoarthritis, unspecified site: Secondary | ICD-10-CM | POA: Diagnosis not present

## 2021-05-28 DIAGNOSIS — M19072 Primary osteoarthritis, left ankle and foot: Secondary | ICD-10-CM

## 2021-05-28 DIAGNOSIS — L84 Corns and callosities: Secondary | ICD-10-CM | POA: Diagnosis not present

## 2021-05-28 MED ORDER — OMEPRAZOLE 40 MG PO CPDR: 40.0000 mg | DELAYED_RELEASE_CAPSULE | Freq: Every day | ORAL | 3 refills | Status: AC

## 2021-05-28 NOTE — Progress Notes (Signed)
Subjective: [name] is a 80 y.o. female patient who presents to office for evaluation of Left foot pain. Patient complains of progressive pain especially over the last several weeks with hard skin used OTC cushion that took some of the dead skin off but still has a burning pain to ball, reports that certain shoes help and walking on carpet helps  Patient Active Problem List   Diagnosis Date Noted   Cheek mass 05/20/2021   Pre-operative clearance 09/08/2020   Erosive osteoarthritis of both hands 10/17/2018   Arthritis of finger 04/26/2018   Arthritis of midtarsal joint of right foot 04/16/2018   TIA (transient ischemic attack)    Status post dilation of esophageal narrowing    Shingles    PVC's (premature ventricular contractions)    Orthostatic hypotension    NICM (nonischemic cardiomyopathy) (HCC)    Nephrolithiasis    Mild CAD    HTN (hypertension)    Ischemic colon ([place])    History of kidney stones    Hiatal hernia    Esophageal stricture    Cataract    Chronic combined systolic and diastolic CHF (congestive heart failure) (HCC)    Bradycardia    Atrial septal aneurysm    Anxiety    Weakness of right hip 11/16/2017   Stress fracture of ankle, initial encounter 11/16/2017   CKD (chronic kidney disease) stage 3, GFR 30-59 ml/min (HCC) 10/13/2017   Hoarseness 09/04/2017   Posterior tibial tendon dysfunction 08/28/2017   Drug hypersensitivity 06/22/2017   Lobar pneumonia, unspecified organism ([place]) 06/02/2017   PHN (postherpetic neuralgia) 04/16/2017   Abnormal TSH 04/12/2017   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[place]) 10/20/2016   Chronic atrial fibrillation ([place]) 09/17/2016   History of CVA (cerebrovascular accident) 09/14/2016   On home oxygen therapy 09/14/2016   Postoperative examination 09/14/2016   Acute on chronic combined systolic (congestive) and diastolic (congestive) heart failure 08/17/2016   Syncope 08/10/2016   COPD (chronic obstructive pulmonary disease) ([place])  08/10/2016   Chronic pain syndrome    Atrial fibrillation with rapid ventricular response ([place]) 07/27/2016   Combined congestive systolic and diastolic heart failure ([place]) 07/27/2016   HLD (hyperlipidemia) 07/27/2016   Insomnia 07/27/2016   Depression with anxiety 07/27/2016   Persistent atrial fibrillation ([place]) 07/27/2016   Atrial fibrillation ([place]) 07/27/2016   Essential hypertension 07/27/2016   Gluteus medius tear 03/15/2016   Hyperglycemia 11/12/2015   Stricture and stenosis of esophagus 03/03/2014   Right wrist pain 12/19/2013   Swelling, mass, or lump in chest 12/19/2013   Depression 11/29/2013   Dysphagia 09/30/2013   Lung nodule 08/01/2013   Special screening for malignant neoplasms, colon 03/20/2013   Screen for colon cancer 03/20/2013   Lower back pain 01/21/2013   COPD exacerbation ([place]) 09/05/2012   Encounter for long-term (current) use of high-risk medication 02/13/2012   Sleep related leg cramps 10/01/2011   Basal cell carcinoma    Osteoarthritis    DI (detrusor instability)    Preventative health care 08/07/2011   Transient cerebral ischemia 03/25/2009   FATIGUE 06/05/2008   Allergic rhinitis 04/24/2008   History of urinary stone 10/05/2007   RECTOCELE WITHOUT MENTION OF UTERINE PROLAPSE 10/04/2007   BLADDER SUSPENSION, HX OF 10/04/2007   Herniation of rectum into vagina 10/04/2007   HEMORRHOIDS 08/11/2007   Gold B Copd with acute bronchitis 08/11/2007   GERD 08/11/2007   History of cardiovascular disorder 08/11/2007   Hyperlipidemia 08/11/2007   Irritable bowel syndrome 08/11/2007   Hemorrhoids 08/11/2007  Stroke [facility]) 12/06/1999    Current Outpatient Medications on File Prior to Visit  Medication Sig Dispense Refill   albuterol (VENTOLIN HFA) 108 (90 Base) MCG/ACT inhaler Inhale 1-2 puffs into the lungs every 6 (six) hours as needed for wheezing or shortness of breath. 18 g 3   amoxicillin (AMOXIL) 500 MG capsule Take 2 capsules (1,000 mg total) by  mouth 2 (two) times daily for 10 days. 40 capsule 0   apixaban (ELIQUIS) 5 MG TABS tablet Take 1 tablet (5 mg total) by mouth 2 (two) times daily. 180 tablet 3   azelastine (ASTELIN) 0.1 % nasal spray Place 2 sprays into the nose daily. Use in each nostril as directed 90 mL 0   buPROPion (WELLBUTRIN XL) 300 MG 24 hr tablet Take 1 tablet (300 mg total) by mouth daily. 90 tablet 3   Calcium Carbonate-Vitamin D (CALCIUM + D PO) Take 1 tablet by mouth 2 (two) times daily.     colestipol (COLESTID) 1 g tablet Take 2 tablets (2 g total) by mouth 2 (two) times daily. 360 tablet 3   cyclobenzaprine (FLEXERIL) 5 MG tablet Take 1 tablet (5 mg total) by mouth 3 (three) times daily as needed for muscle spasms. 270 tablet 1   ezetimibe (ZETIA) 10 MG tablet Take 1 tablet (10 mg total) by mouth daily. 90 tablet 1   fluticasone (FLONASE) 50 MCG/ACT nasal spray Place 1 spray into both nostrils 2 (two) times daily. 48 g 1   Fluticasone-Umeclidin-Vilant (TRELEGY ELLIPTA) 100-62.5-25 MCG/INH AEPB Inhale 1 puff into the lungs daily. I inhalation once daily 180 each 1   furosemide (LASIX) 20 MG tablet Take 1 tablet (20 mg total) by mouth daily as needed (swelling). 90 tablet 1   gabapentin (NEURONTIN) 300 MG capsule 1 tab by mouth in AM, 1 po at midday, then 2 by mouth at bedtime for sleep and pain 360 capsule 1   guaiFENesin (MUCINEX) 600 MG 12 hr tablet Take 1,200 mg by mouth 2 (two) times daily as needed for cough or to loosen phlegm.     hydroxychloroquine (PLAQUENIL) 200 MG tablet Take 400 mg by mouth at bedtime.     ipratropium (ATROVENT) 0.03 % nasal spray Place 2 sprays into both nostrils 3 (three) times daily as needed for rhinitis. 90 mL 1   levothyroxine (SYNTHROID) 25 MCG tablet Take 1 tablet (25 mcg total) by mouth daily before breakfast. Annual appt due in Nov must see provider for future refills 90 tablet 3   Lidocaine HCl 2 % CREA Apply 1 application topically daily as needed (for shingles flares).      loperamide (IMODIUM) 2 MG capsule Take by mouth.     loratadine (CLARITIN) 10 MG tablet Take 10 mg by mouth daily.     Melatonin 5 MG TABS Take 5 mg by mouth at bedtime.     metoprolol succinate (TOPROL-XL) 100 MG 24 hr tablet Take 1 tablet (100 mg total) by mouth daily. Take with or immediately following a meal. 90 tablet 2   mirabegron ER (MYRBETRIQ) 50 MG TB24 tablet Take 50 mg by mouth daily.     montelukast (SINGULAIR) 10 MG tablet Take 1 tablet (10 mg total) by mouth daily. Annual appt due in Nov must see provider for future refills 90 tablet 1   Multiple Vitamin (MULTIVITAMIN) tablet Take 1 tablet by mouth daily.     nystatin (MYCOSTATIN) 100000 UNIT/ML suspension Take 5 mLs (500,000 Units total) by mouth 4 (four) times daily.  60 mL 0   NYSTATIN PO Take 5 mLs by mouth as needed.     Probiotic Product (PROBIOTIC DAILY PO) Take by mouth in the morning and at bedtime.      traZODone (DESYREL) 50 MG tablet Take 1 tablet (50 mg total) by mouth at bedtime and may repeat dose one time if needed. 180 tablet 1   No current facility-administered medications on file prior to visit.    Allergies  Allergen Reactions   Clarithromycin Other (See Comments)    REACTION: possible rash but could have been legionarres dz with rash   Lipitor [Atorvastatin] Other (See Comments)    MUSCLE SPASMS   Simvastatin Other (See Comments)    Muscle and joint pain   Cardizem [Diltiazem Hcl] Hives   Contrast Media [Iodinated Diagnostic Agents]     FYI - during admission at [facility] 08/2016 patient developed hives, question related to cardizem or contrast dye - not clear   Crestor [Rosuvastatin Calcium]     fagitue and joint pain, "NO STATINS"   Rosuvastatin Calcium Other (See Comments)    fagitue and joint pain, "NO STATINS"    Objective:  General: Alert and oriented x3 in no acute distress  Dermatology: No open lesions bilateral lower extremities, no webspace macerations, no ecchymosis bilateral,  all nails x 10 are well manicured. +keratosis with nucleated  sub met 1 & 5 on left.   Vascular: Dorsalis Pedis and Posterior Tibial pedal pulses palpable, Capillary Fill Time 3 seconds, Scant pedal hair growth bilateral, no edema bilateral lower extremities, Temperature gradient within normal limits.  Neurology: Gross sensation intact via light touch bilateral, Subjective burning to balls L>R.   Musculoskeletal: Mild tenderness with palpation at keratotic lesions sub met 1 &5 on left, + fat pad atrophy, +prominent metatarsals,  Strength within normal limits in all groups bilateral.   Gait: Antalgic gait  Xrays  Left Foot   Impression: plantar flexed mets, midfoot arthritis.   Assessment and Plan: Problem List Items Addressed This Visit   None Visit Diagnoses     Foot pain, left    -  Primary   Relevant Orders   DG Foot Complete Left   Fat pad atrophy of foot       Arthritis of foot, left       Acquired deformity of left toe       Callus            -Complete examination performed -Xrays reviewed -Discussed treatement options for pain to callus secondary to foot type -Mechanically debrided callus x 2 on left using 15 blade without incident and applied Salinocaine and bandaid -Dispensed met pads -Recommend OTC Nervive nerve relief supplement  -Recommend good supportive shoes -Patient to return to office as needed or sooner if condition worsens.  Landis Martins, DPM

## 2021-05-28 NOTE — Patient Instructions (Signed)
Nervive supplement for your nerves can be purchased OTC at [facility]

## 2021-06-01 ENCOUNTER — Other Ambulatory Visit: Payer: Self-pay | Admitting: Sports Medicine

## 2021-06-01 DIAGNOSIS — M1990 Unspecified osteoarthritis, unspecified site: Secondary | ICD-10-CM

## 2021-06-01 DIAGNOSIS — Z79899 Other long term (current) drug therapy: Secondary | ICD-10-CM | POA: Diagnosis not present

## 2021-06-01 DIAGNOSIS — M154 Erosive (osteo)arthritis: Secondary | ICD-10-CM | POA: Diagnosis not present

## 2021-06-08 DIAGNOSIS — N1832 Chronic kidney disease, stage 3b: Secondary | ICD-10-CM | POA: Diagnosis not present

## 2021-06-08 DIAGNOSIS — I4891 Unspecified atrial fibrillation: Secondary | ICD-10-CM | POA: Diagnosis not present

## 2021-06-08 DIAGNOSIS — I129 Hypertensive chronic kidney disease with stage 1 through stage 4 chronic kidney disease, or unspecified chronic kidney disease: Secondary | ICD-10-CM | POA: Diagnosis not present

## 2021-06-08 DIAGNOSIS — N200 Calculus of kidney: Secondary | ICD-10-CM | POA: Diagnosis not present

## 2021-06-08 DIAGNOSIS — I509 Heart failure, unspecified: Secondary | ICD-10-CM | POA: Diagnosis not present

## 2021-06-08 DIAGNOSIS — D631 Anemia in chronic kidney disease: Secondary | ICD-10-CM | POA: Diagnosis not present

## 2021-06-08 DIAGNOSIS — U071 COVID-19: Secondary | ICD-10-CM | POA: Diagnosis not present

## 2021-06-09 DIAGNOSIS — N3941 Urge incontinence: Secondary | ICD-10-CM | POA: Diagnosis not present

## 2021-06-09 DIAGNOSIS — R351 Nocturia: Secondary | ICD-10-CM | POA: Diagnosis not present

## 2021-06-10 MED ORDER — FLUTICASONE PROPIONATE 50 MCG/ACT NA SUSP: 1.0000 | Freq: Two times a day (BID) | NASAL | 1 refills | Status: AC

## 2021-06-10 MED ORDER — IPRATROPIUM BROMIDE 0.03 % NA SOLN: 2.0000 | Freq: Three times a day (TID) | NASAL | 1 refills | Status: AC | PRN

## 2021-06-10 MED ORDER — AZELASTINE HCL 0.1 % NA SOLN: NASAL | 0 refills | Status: DC

## 2021-06-11 ENCOUNTER — Ambulatory Visit
Admission: RE | Admit: 2021-06-11 | Discharge: 2021-06-11 | Disposition: A | Discharge status: Home / Self Care | Payer: Medicare Other | Source: Ambulatory Visit | Attending: Obstetrics & Gynecology | Admitting: Obstetrics & Gynecology

## 2021-06-11 ENCOUNTER — Other Ambulatory Visit: Payer: Self-pay

## 2021-06-11 DIAGNOSIS — Z1231 Encounter for screening mammogram for malignant neoplasm of breast: Secondary | ICD-10-CM

## 2021-06-17 ENCOUNTER — Encounter: Payer: Self-pay | Admitting: Internal Medicine

## 2021-06-17 MED ORDER — GABAPENTIN 300 MG PO CAPS: ORAL_CAPSULE | ORAL | 1 refills | Status: AC

## 2021-06-22 DIAGNOSIS — J3489 Other specified disorders of nose and nasal sinuses: Secondary | ICD-10-CM | POA: Diagnosis not present

## 2021-06-22 DIAGNOSIS — R220 Localized swelling, mass and lump, head: Secondary | ICD-10-CM | POA: Diagnosis not present

## 2021-06-22 DIAGNOSIS — Z7901 Long term (current) use of anticoagulants: Secondary | ICD-10-CM | POA: Diagnosis not present

## 2021-06-23 ENCOUNTER — Other Ambulatory Visit: Payer: Self-pay | Admitting: Otolaryngology

## 2021-06-23 ENCOUNTER — Other Ambulatory Visit: Payer: Self-pay

## 2021-06-23 DIAGNOSIS — J3489 Other specified disorders of nose and nasal sinuses: Secondary | ICD-10-CM

## 2021-06-23 DIAGNOSIS — R220 Localized swelling, mass and lump, head: Secondary | ICD-10-CM

## 2021-06-23 DIAGNOSIS — Z7901 Long term (current) use of anticoagulants: Secondary | ICD-10-CM

## 2021-06-23 MED ORDER — COLESTIPOL HCL 1 G PO TABS: 2.0000 g | ORAL_TABLET | Freq: Two times a day (BID) | ORAL | 3 refills | Status: DC

## 2021-06-24 ENCOUNTER — Telehealth: Payer: Self-pay

## 2021-06-25 MED ORDER — PREDNISONE 50 MG PO TABS: ORAL_TABLET | ORAL | 0 refills | Status: DC

## 2021-06-25 MED ORDER — DIPHENHYDRAMINE HCL 50 MG PO TABS: 50.0000 mg | ORAL_TABLET | Freq: Once | ORAL | 0 refills | Status: DC

## 2021-06-25 NOTE — Telephone Encounter (Signed)
Phone call to patient to review instructions for 13 hr prep for CT w/ contrast on 07/13/21 at 1020AM Prescription called into [facility]. Pt aware and verbalized understanding of instructions.  Prescription:Pt to take 50 mg of prednisone on 07/12/21 at 920PM, 50 mg of prednisone on 07/13/21 at 0320AM, and 50 mg of prednisone on 07/13/21 at 0920AM. Pt is also to take 50 mg of benadryl on 07/13/21 at 0920AM. Please call [phone] with any questions.  Advised patient to have a driver after consuming Benadryl as this may cause drowsiness.

## 2021-07-06 ENCOUNTER — Telehealth: Payer: Self-pay | Admitting: *Deleted

## 2021-07-06 NOTE — Chronic Care Management (AMB) (Signed)
  Chronic Care Management   Outreach Note  07/06/2021 Name: [name] MRN: [id number] DOB: January 17, 1941  [name] is a 80 y.o. year old female who is a primary care patient of Biagio Borg, MD. I reached out to [name] by phone today in response to a referral sent by [name]'s PCP, Dr. Jenny Reichmann.      An unsuccessful telephone outreach was attempted today. The patient was referred to the case management team for assistance with care management and care coordination.   Follow Up Plan: A HIPAA compliant phone message was left for the patient providing contact information and requesting a return call. The care management team will reach out to the patient again over the next 7 days.  If patient returns call to provider office, please advise to call [place] at [phone].  [facility]  Direct Dial: [phone]

## 2021-07-13 ENCOUNTER — Ambulatory Visit
Admission: RE | Admit: 2021-07-13 | Discharge: 2021-07-13 | Disposition: A | Discharge status: Home / Self Care | Payer: Medicare Other | Source: Ambulatory Visit | Attending: Otolaryngology | Admitting: Otolaryngology

## 2021-07-13 DIAGNOSIS — J3489 Other specified disorders of nose and nasal sinuses: Secondary | ICD-10-CM

## 2021-07-13 DIAGNOSIS — M4312 Spondylolisthesis, cervical region: Secondary | ICD-10-CM | POA: Diagnosis not present

## 2021-07-13 DIAGNOSIS — K110 Atrophy of salivary gland: Secondary | ICD-10-CM | POA: Diagnosis not present

## 2021-07-13 DIAGNOSIS — M5031 Other cervical disc degeneration,  high cervical region: Secondary | ICD-10-CM | POA: Diagnosis not present

## 2021-07-13 DIAGNOSIS — Z7901 Long term (current) use of anticoagulants: Secondary | ICD-10-CM

## 2021-07-13 DIAGNOSIS — R220 Localized swelling, mass and lump, head: Secondary | ICD-10-CM | POA: Diagnosis not present

## 2021-07-13 MED ORDER — DIPHENHYDRAMINE HCL 50 MG/ML IJ SOLN
50.0000 mg | Freq: Once | INTRAMUSCULAR | Status: DC
Start: 2021-07-13 — End: 2021-07-14

## 2021-07-13 MED ORDER — PREDNISONE 50 MG PO TABS: 50.0000 mg | ORAL_TABLET | Freq: Four times a day (QID) | ORAL | Status: DC

## 2021-07-13 MED ORDER — IOPAMIDOL (ISOVUE-300) INJECTION 61%
75.0000 mL | Freq: Once | INTRAVENOUS | Status: AC | PRN
  Administered 2021-07-13: 75 mL via INTRAVENOUS

## 2021-07-13 MED ORDER — DIPHENHYDRAMINE HCL 50 MG PO CAPS: 50.0000 mg | ORAL_CAPSULE | Freq: Once | ORAL | Status: DC

## 2021-07-19 ENCOUNTER — Encounter: Payer: Self-pay | Admitting: Internal Medicine

## 2021-07-19 ENCOUNTER — Other Ambulatory Visit: Payer: Self-pay | Admitting: Internal Medicine

## 2021-07-19 DIAGNOSIS — T7840XA Allergy, unspecified, initial encounter: Secondary | ICD-10-CM

## 2021-07-19 MED ORDER — HYDROXYZINE HCL 10 MG PO TABS
10.0000 mg | ORAL_TABLET | ORAL | 0 refills | Status: DC | PRN
Start: 2021-07-19 — End: 2021-08-13

## 2021-07-21 NOTE — Chronic Care Management (AMB) (Signed)
  Chronic Care Management   Note  07/21/2021 Name: [name] MRN: [id number] DOB: 15-Jul-1941  [name] is a 80 y.o. year old female who is a primary care patient of Biagio Borg, MD. I reached out to [name] by phone today in response to a referral sent by Ms. Beatris Si Stiver's PCP, Dr. Jenny Reichmann.      Ms. Utecht was given information about Chronic Care Management services today including:  CCM service includes personalized support from designated clinical staff supervised by her physician, including individualized plan of care and coordination with other care providers 24/7 contact phone numbers for assistance for urgent and routine care needs. Service will only be billed when office clinical staff spend 20 minutes or more in a month to coordinate care. Only one practitioner may furnish and bill the service in a calendar month. The patient may stop CCM services at any time (effective at the end of the month) by phone call to the office staff. The patient will be responsible for cost sharing (co-pay) of up to 20% of the service fee (after annual deductible is met).  Patient did not agree to enrollment in care management services and does not wish to consider at this time.  Follow up plan: Patient declines engagement by the care management team. Appropriate care team members and provider have been notified via electronic communication. The care management team is available to follow up with the patient after provider conversation with the patient regarding recommendation for care management engagement and subsequent re-referral to the care management team.   [facility] [facility]  Direct Dial: [phone]

## 2021-07-28 DIAGNOSIS — J449 Chronic obstructive pulmonary disease, unspecified: Secondary | ICD-10-CM | POA: Diagnosis not present

## 2021-07-28 DIAGNOSIS — R509 Fever, unspecified: Secondary | ICD-10-CM | POA: Diagnosis not present

## 2021-07-28 DIAGNOSIS — Z20828 Contact with and (suspected) exposure to other viral communicable diseases: Secondary | ICD-10-CM | POA: Diagnosis not present

## 2021-07-28 DIAGNOSIS — R051 Acute cough: Secondary | ICD-10-CM | POA: Diagnosis not present

## 2021-07-30 ENCOUNTER — Telehealth: Payer: Self-pay | Admitting: Adult Health

## 2021-07-30 MED ORDER — AZITHROMYCIN 250 MG PO TABS: 250.0000 mg | ORAL_TABLET | Freq: Every day | ORAL | 0 refills | Status: DC

## 2021-07-30 MED ORDER — AZITHROMYCIN 250 MG PO TABS: ORAL_TABLET | ORAL | 0 refills | Status: DC

## 2021-07-30 NOTE — Telephone Encounter (Signed)
I called and spoke with patient regarding TP recs. Patient verbalized understanding. I sent in [place] to preferred pharmacy and made a 2 wk follow up appt with TP on 06/09. Nothing further needed.

## 2021-07-30 NOTE — Telephone Encounter (Signed)
Begin Zpack take as directed (has taken before )  Mucinex DM Twice daily   Finish Mol/Pred regimen  Ov in 2 weeks and As needed   Please contact office for sooner follow up if symptoms do not improve or worsen or seek emergency care

## 2021-07-30 NOTE — Addendum Note (Signed)
Addended by: Vanessa Barbara on: 07/30/2021 11:54 AM   Modules accepted: Orders

## 2021-07-30 NOTE — Telephone Encounter (Signed)
Primary Pulmonologist: Dr. Valeta Harms Last office visit and with whom: 09/08/20 with Tammy Parrett What do we see them for (pulmonary problems): COPD, moderate, Allegric Rhinitis, GERD Last OV assessment/plan: see below  Was appointment offered to patient (explain)?    COPD (chronic obstructive pulmonary disease) ([place]) Compensated on present regimen.  Remains independent.   Plan  Patient Instructions  Severe COPD:  Continue Trelegy 1 puff daily Rinse her mouth well after using Trelegy Practice good hand hygiene, social distancing and mask are key .  Stay active as able.  Chest xray today    Gastroesophageal reflux disease: Continue taking omeprazole daily in am .  Continue taking famotidine in evening .    Chronic Rhinitis: Continue loratadine 10 mg daily As needed   Continue Astelin nose spray daily as needed.  Continue fluticasone nose spray daily  Continue on Singulair daily.    Good luck with upcoming shoulder surgery      We will see you back in 6 months with Dr. Valeta Harms or Parrett NP and As needed      Reason for call: I called and spoke with patient. Patient tested positive for Covid on Tuesday and went to UC. They gave her Molnupiravir and 5 days of prednisone but no Abx. Patient has been experiencing cough with green mucous production and wheezing. Patient has been using Trelegy inhaler, Ventolin 3 times daily and neb meds twice daily. Patient is requesting Abx due to green mucous coming up. No other complaints of any symptoms. Will route to TP for recs.  Tammy, please advise. Thanks!  (examples of things to ask: : When did symptoms start? Fever? Cough? Productive? Color to sputum? More sputum than usual? Wheezing? Have you needed increased oxygen? Are you taking your respiratory medications? What over the counter measures have you tried?)  Allergies  Allergen Reactions   Clarithromycin Other (See Comments)    REACTION: possible rash but could have been legionarres dz  with rash   Lipitor [Atorvastatin] Other (See Comments)    MUSCLE SPASMS   Simvastatin Other (See Comments)    Muscle and joint pain   Cardizem [Diltiazem Hcl] Hives   Contrast Media [Iodinated Diagnostic Agents]     FYI - during admission at [facility] 08/2016 patient developed hives, question related to cardizem or contrast dye - not clear   Crestor [Rosuvastatin Calcium]     fagitue and joint pain, "NO STATINS"   Rosuvastatin Calcium Other (See Comments)    fagitue and joint pain, "NO STATINS"    Immunization History  Administered Date(s) Administered   Fluad Quad(high Dose 65+) 08/08/2019, 08/17/2020   Influenza Split 09/11/2012   Influenza Whole 09/15/2006, 08/04/2010, 08/11/2011   Influenza, High Dose Seasonal PF 08/22/2017, 09/05/2018, 08/19/2020   Influenza,inj,Quad PF,6+ Mos 09/02/2013, 08/26/2014, 08/19/2015, 08/23/2016   PFIZER(Purple Top)SARS-COV-2 Vaccination 12/18/2019, 01/08/2020, 09/01/2020, 05/11/2021   Pneumococcal Conjugate-13 11/29/2013   Pneumococcal Polysaccharide-23 12/06/2003, 12/05/2006   Td 12/05/2004   Zoster Recombinat (Shingrix) 04/12/2017, 06/19/2017   Zoster, Live 07/07/2008

## 2021-08-02 DIAGNOSIS — Z20822 Contact with and (suspected) exposure to covid-19: Secondary | ICD-10-CM | POA: Diagnosis not present

## 2021-08-05 DIAGNOSIS — Z20822 Contact with and (suspected) exposure to covid-19: Secondary | ICD-10-CM | POA: Diagnosis not present

## 2021-08-10 ENCOUNTER — Other Ambulatory Visit: Payer: Self-pay

## 2021-08-10 ENCOUNTER — Telehealth: Payer: Self-pay | Admitting: Adult Health

## 2021-08-10 ENCOUNTER — Telehealth (INDEPENDENT_AMBULATORY_CARE_PROVIDER_SITE_OTHER): Payer: Medicare Other | Admitting: Adult Health

## 2021-08-10 DIAGNOSIS — U071 COVID-19: Secondary | ICD-10-CM | POA: Diagnosis not present

## 2021-08-10 DIAGNOSIS — J449 Chronic obstructive pulmonary disease, unspecified: Secondary | ICD-10-CM

## 2021-08-10 MED ORDER — PREDNISONE 10 MG PO TABS: ORAL_TABLET | ORAL | 0 refills | Status: DC

## 2021-08-10 MED ORDER — AMOXICILLIN-POT CLAVULANATE 875-125 MG PO TABS: 1.0000 | ORAL_TABLET | Freq: Two times a day (BID) | ORAL | 0 refills | Status: AC

## 2021-08-10 MED ORDER — BENZONATATE 200 MG PO CAPS: 200.0000 mg | ORAL_CAPSULE | Freq: Three times a day (TID) | ORAL | 1 refills | Status: AC | PRN

## 2021-08-10 NOTE — Telephone Encounter (Signed)
Spoke with the pt  She finished zpack that we called in 07/28/21  She states still having a lot of cough, hard to produce sputum but it's light green when she does  It was darker green prior to abx  She had tested pos for covid 07/28/21  She has not been having any f/c/s, aches and her wheezing and SOB are better today She feels like she still needs abx to clear up green sputum  She still tests pos for covid on at home test and states she has been taking a test every day since she was dx- Advised no need to keep testing every day, as she could test pos for up to 90 days  She is taking her trelegy daily, mucinex bid and duoneb bid  Tammy, please advise thanks!

## 2021-08-10 NOTE — Telephone Encounter (Signed)
Has not been seen since

## 2021-08-10 NOTE — Telephone Encounter (Signed)
Video visit scheduled for 3:15 today. Patient is aware and voiced her understanding.  Nothing further needed at this time.

## 2021-08-10 NOTE — Progress Notes (Signed)
Virtual Visit via Video Note  I connected with [name] on 08/10/21 at  3:00 PM EDT by a video enabled telemedicine application and verified that I am speaking with the correct person using two identifiers.  Location: Patient: Home  Provider: Office    I discussed the limitations of evaluation and management by telemedicine and the availability of in person appointments. The patient expressed understanding and agreed to proceed.  History of Present Illness: 80 yo female former smoker followed for Severe COPD and chronic rhinitis  Stable lung nodule (serial CT chest from 2015-2018 showed stable submillimeter right lower lobe nodule consistent with benign etiology) Medical history significant for chronic combined congestive heart failure, A. 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 difficile Rheumatoid arthritis on Plaquenil  Todays virtual visit is for an acute office visit. Developed acute symptoms of cough and congestion. Seen at urgent care , positive for Covid . She had direct contact with husband who is positive . 8/24 Positive Covid test and symptoms . She was started on molnupiravir and steroid pack . Cough has continued . She was called in [place]. She complains that cough is still present and coughing up green mucus . She also continues to test positive for Covid on home test. No fever or hemoptysis . No chest pain. Eating well.  Taking mucinex without much relief.  O2 sats at home have been good above 90% on room air.  Currently is 94% today.  Past Medical History:  Diagnosis Date   Allergic rhinitis    Anxiety    Atrial septal aneurysm    Basal cell carcinoma    Bradycardia    Cataract    bil cataracts removed   Chronic combined systolic and diastolic CHF (congestive heart  failure) (HCC)    CKD (chronic kidney disease), stage III (HCC)    Clostridium difficile infection    COPD (chronic obstructive pulmonary disease) ([place])    Depression 11/29/2013   DI (detrusor instability)    Esophageal stricture    GERD (gastroesophageal reflux disease)    Hemorrhoids    Hiatal hernia    History of kidney stones    HLD (hyperlipidemia)    HTN (hypertension)    Ischemic colon ([place])    a. 08/2016 - adm to  [facility] with a ruptured colon s/p right hemicolectomy with ileostomy formation for ischemic and necrotic colon.    Lung nodule    a. followed by pulmonology   Mild CAD    a. minor nonobstructive CAD 08/22/16.   Nephrolithiasis    hx   NICM (nonischemic cardiomyopathy) (HCC)    Orthostatic hypotension    Osteoarthritis    PAF (paroxysmal atrial fibrillation) ([place])    a. diagnosed 07/2016 s/p DCCV 08/01/16 with recurrence, managed with rate control for now.    Pneumonia 2013   PVC's (premature ventricular contractions)    Shingles since Nov 18, 2013   on right arm from shoulder to wrist   Status post dilation of esophageal narrowing    Stroke [facility]) 2001   STROKES, TIA'S   Syncope    a. 08/2016 felt due to orthostatic hypotension.   TIA (transient ischemic attack) last 2001   anticoagulation therapy on coumadin    Current Outpatient Medications on File Prior to Visit  Medication Sig Dispense Refill  albuterol (VENTOLIN HFA) 108 (90 Base) MCG/ACT inhaler Inhale 1-2 puffs into the lungs every 6 (six) hours as needed for wheezing or shortness of breath. 18 g 3   apixaban (ELIQUIS) 5 MG TABS tablet Take 1 tablet (5 mg total) by mouth 2 (two) times daily. 180 tablet 3   azelastine (ASTELIN) 0.1 % nasal spray Place 2 sprays into the nose daily. Use in each nostril as directed 90 mL 0   buPROPion (WELLBUTRIN XL) 300 MG 24 hr tablet Take 1 tablet (300 mg total) by mouth daily. 90 tablet 3   Calcium Carbonate-Vitamin D (CALCIUM + D PO) Take 1 tablet by mouth 2  (two) times daily.     colestipol (COLESTID) 1 g tablet Take 2 tablets (2 g total) by mouth 2 (two) times daily. 360 tablet 3   cyclobenzaprine (FLEXERIL) 5 MG tablet Take 1 tablet (5 mg total) by mouth 3 (three) times daily as needed for muscle spasms. 270 tablet 1   fluticasone (FLONASE) 50 MCG/ACT nasal spray Place 1 spray into both nostrils 2 (two) times daily. 48 g 1   Fluticasone-Umeclidin-Vilant (TRELEGY ELLIPTA) 100-62.5-25 MCG/INH AEPB Inhale 1 puff into the lungs daily. I inhalation once daily 180 each 1   furosemide (LASIX) 20 MG tablet Take 1 tablet (20 mg total) by mouth daily as needed (swelling). 90 tablet 1   gabapentin (NEURONTIN) 300 MG capsule 1 tab by mouth in AM, 1 po at midday, then 2 by mouth at bedtime for sleep and pain 360 capsule 1   guaiFENesin (MUCINEX) 600 MG 12 hr tablet Take 1,200 mg by mouth 2 (two) times daily as needed for cough or to loosen phlegm.     hydroxychloroquine (PLAQUENIL) 200 MG tablet Take 400 mg by mouth at bedtime.     ipratropium (ATROVENT) 0.03 % nasal spray Place 2 sprays into both nostrils 3 (three) times daily as needed for rhinitis. 90 mL 1   levothyroxine (SYNTHROID) 25 MCG tablet Take 1 tablet (25 mcg total) by mouth daily before breakfast. Annual appt due in Nov must see provider for future refills 90 tablet 3   Lidocaine HCl 2 % CREA Apply 1 application topically daily as needed (for shingles flares).     loperamide (IMODIUM) 2 MG capsule Take by mouth.     loratadine (CLARITIN) 10 MG tablet Take 10 mg by mouth daily.     Melatonin 5 MG TABS Take 5 mg by mouth at bedtime.     metoprolol succinate (TOPROL-XL) 100 MG 24 hr tablet Take 1 tablet (100 mg total) by mouth daily. Take with or immediately following a meal. 90 tablet 2   mirabegron ER (MYRBETRIQ) 50 MG TB24 tablet Take 50 mg by mouth daily.     montelukast (SINGULAIR) 10 MG tablet Take 1 tablet (10 mg total) by mouth daily. Annual appt due in Nov must see provider for future refills  90 tablet 1   Multiple Vitamin (MULTIVITAMIN) tablet Take 1 tablet by mouth daily.     NYSTATIN PO Take 5 mLs by mouth as needed.     omeprazole (PRILOSEC) 40 MG capsule Take 1 capsule (40 mg total) by mouth daily. 90 capsule 3   Probiotic Product (PROBIOTIC DAILY PO) Take by mouth in the morning and at bedtime.      traZODone (DESYREL) 50 MG tablet Take 1 tablet (50 mg total) by mouth at bedtime and may repeat dose one time if needed. 180 tablet 1   azithromycin (ZITHROMAX) 250  MG tablet Take 2 tablets today and then 1 tablet daily until gone. 6 tablet 0   diphenhydrAMINE (BENADRYL) 50 MG tablet Take 1 tablet (50 mg total) by mouth once for 1 dose. Pt to take 50 mg of Benadryl pm 07/13/21 @ 9:20 AM. Please call [phone] with any questions. 1 tablet 0   ezetimibe (ZETIA) 10 MG tablet Take 1 tablet (10 mg total) by mouth daily. 90 tablet 1   hydrOXYzine (ATARAX/VISTARIL) 10 MG tablet Take 1 tablet (10 mg total) by mouth every 4 (four) hours as needed for itching. 30 tablet 0   nystatin (MYCOSTATIN) 100000 UNIT/ML suspension Take 5 mLs (500,000 Units total) by mouth 4 (four) times daily. 60 mL 0   predniSONE (DELTASONE) 50 MG tablet Pt to take 50 mg of prednisone on 07/12/21 at 920PM, 50 mg of prednisone on 07/13/21 at 0320AM, and 50 mg of prednisone on 07/13/21 at 0920AM. Pt is also to take 50 mg of benadryl on 07/13/21 at 0920AM. Please call [phone] with any questions. 3 tablet 0   No current facility-administered medications on file prior to visit.       Observations/Objective: 10/10/16: FVC 1.94 L (70%) FEV1 0.93 L (45%) FEV1/FVC 0.48 FEF 25-75 0.35 L (22%) positive bronchodilator response 03/01/16: FVC 2.55 L (91%) FEV1 1.30 L (62%) FEV1/FVC 0.51 FEF 25-75 0.56 L (34%) negative bronchodilator response TLC 5.66 L (111%) RV 116% ERV 133% DLCO uncorrected 62% (hemoglobin 15.1) 12/28/11: FVC 2.89 L (104%) FEV1 1.22 L (62%) FEV1/FVC 0.42 FEF 25-75 0.33 L (15%) negative bronchodilator response TLC  6.19 L (129%) RV 163% ERV 114% DLCO uncorrected 69%   6MWT 10/10/16:  Walked 96 meters / Baseline Sat 93% on RA / Nadir Sat 93% on RA @ rest (couldn't finish due to weakness & dyspnea  W/ 2:57 left) 08/24/16:  Walked 1 lap & was 96% at lowest on room air   IMAGING CXR PA/LAT 06/19/17 :      CXR PA/LAT 05/18/17:  Patchy opacification in right middle and lower lobes. No pleural effusion appreciated. Heart normal in size & mediastinum normal in contour.   CT CHEST W/O 01/23/17: Resolution of groundglass opacity. 7 mm right lower lobe nodule unchanged. Apical predominant emphysematous changes. Right middle lobe nodule was not appreciated on this image. No pleural effusion or thickening. No pathologic mediastinal adenopathy. No pericardial effusion.   CTA CHEST 08/17/16:  No evidence of PE. No pleural effusion or thickening. No pathologic mediastinal adenopathy. Groundglass noted within right lung predominantly in the upper lung zone. Moderate upper lobe predominant emphysema which has progressed since 2015. 7 mm right lower lobe nodule has not changed since 2015. Patient has a 4 mm nodule within right middle lobe which appears new.   CXR PA/LAT 05/25/16: Hyperinflation with flattening of the diaphragms. No parenchymal opacity or mass appreciated. No pleural effusion. Heart normal in size & mediastinum normal in contour.   CXR PA/LAT 03/22/16:  Hyperinflation with elevation of left hemidiaphragm. No new focal opacity. Heart normal in size & mediastinum normal in contour.   CXR PA/LAT 11/09/15:  No focal opacity or effusion appreciated. Heart normal in size. Mediastinum normal in contour. Mild hyperinflation with flattening of the diaphragms.    CARDIAC TTE (06/24/16): LVEF 45-50% with grade 2 diastolic dysfunction. LA moderately to severely dilated. RA normal in size. RV normal in size and function. No aortic stenosis or regurgitation. Moderate centrally directed mitral regurgitation. Paradoxical  ventricular septal motion consistent with intraventricular conduction delay. No  pulmonic regurgitation. No tricuspid regurgitation. No pericardial effusion.   MICROBIOLOGY Sputum Culture (07/05/17):  Candida Species / AFB negative / Moderate Squamous Epithelials    LABS 12/22/15 Alpha-1 antitrypsin: MM (143) 11/2017 IgE 9 (normal)  08/10/2021 -speaks in full sentences , NAD .   Assessment and Plan: Covid 19 infection with secondary acute bronchitis /COPD exacerbation  Will begin Augmentin and prednisone taper.  Check chest x-ray on return visit in 3 days.  Patient is to continue to quarantine as we discussed.  Continue on COPD maintenance regimen with Trelegy.  Albuterol inhaler as needed.  Plan  Patient Instructions  Begin Augmentin twice daily for 7 days.  Take with food Mucinex DM twice daily as needed for cough and congestion Tessalon Perles 3 times daily as needed for cough Prednisone taper over the next week Continue on Trelegy inhaler daily Albuterol as needed Follow-up in 3 days as planned with chest x-ray and as needed Please contact office for sooner follow up if symptoms do not improve or worsen or seek emergency care    Follow Up Instructions:    I discussed the assessment and treatment plan with the patient. The patient was provided an opportunity to ask questions and all were answered. The patient agreed with the plan and demonstrated an understanding of the instructions.   The patient was advised to call back or seek an in-person evaluation if the symptoms worsen or if the condition fails to improve as anticipated.  I provided 30  minutes of non-face-to-face time during this encounter.   Rexene Edison, NP

## 2021-08-10 NOTE — Telephone Encounter (Signed)
Needs ov , not seen since 09/2020 .  Can set up video visit today at 3:15 , double book in 3 pm lot  Please contact office for sooner follow up if symptoms do not improve or worsen or seek emergency care

## 2021-08-10 NOTE — Patient Instructions (Addendum)
Begin Augmentin twice daily for 7 days.  Take with food Mucinex DM twice daily as needed for cough and congestion Tessalon Perles 3 times daily as needed for cough Prednisone taper over the next week Continue on Trelegy inhaler daily Albuterol as needed Follow-up in 3 days as planned with chest x-ray and as needed Please contact office for sooner follow up if symptoms do not improve or worsen or seek emergency care

## 2021-08-13 ENCOUNTER — Other Ambulatory Visit: Payer: Self-pay

## 2021-08-13 ENCOUNTER — Encounter: Payer: Self-pay | Admitting: Adult Health

## 2021-08-13 ENCOUNTER — Ambulatory Visit (INDEPENDENT_AMBULATORY_CARE_PROVIDER_SITE_OTHER): Payer: Medicare Other | Admitting: Adult Health

## 2021-08-13 ENCOUNTER — Ambulatory Visit (INDEPENDENT_AMBULATORY_CARE_PROVIDER_SITE_OTHER): Payer: Medicare Other

## 2021-08-13 VITALS — BP 140/70 | HR 78 | Temp 97.9°F | Ht 63.0 in | Wt 126.4 lb

## 2021-08-13 DIAGNOSIS — I5042 Chronic combined systolic (congestive) and diastolic (congestive) heart failure: Secondary | ICD-10-CM | POA: Diagnosis not present

## 2021-08-13 DIAGNOSIS — J449 Chronic obstructive pulmonary disease, unspecified: Secondary | ICD-10-CM | POA: Diagnosis not present

## 2021-08-13 DIAGNOSIS — U071 COVID-19: Secondary | ICD-10-CM

## 2021-08-13 DIAGNOSIS — R059 Cough, unspecified: Secondary | ICD-10-CM | POA: Diagnosis not present

## 2021-08-13 DIAGNOSIS — J441 Chronic obstructive pulmonary disease with (acute) exacerbation: Secondary | ICD-10-CM | POA: Diagnosis not present

## 2021-08-13 DIAGNOSIS — Z20822 Contact with and (suspected) exposure to covid-19: Secondary | ICD-10-CM | POA: Diagnosis not present

## 2021-08-13 NOTE — Assessment & Plan Note (Signed)
Slow to resolve exacerbation with recent COVID-19 infection.  She is starting to improve on current course of antibiotics and steroids.  Chest x-ray today shows no acute process.  O2 saturations are normal.  Patient is continue on her current regimen.  Use albuterol nebulizer as needed.  And continue on Mucinex twice daily.  Plan  Patient Instructions  Finish Augmentin twice daily for 7 days.  Take with food Mucinex DM twice daily as needed for cough and congestion Tessalon Perles 3 times daily as needed for cough Finish Prednisone taper over the next week Continue on Trelegy inhaler daily Albuterol inhaler or nebs as needed.  Follow-up in 4 weeks with Dr. Valeta Harms or Jerian Morais NP and As needed   Please contact office for sooner follow up if symptoms do not improve or worsen or seek emergency care

## 2021-08-13 NOTE — Assessment & Plan Note (Signed)
COVID-19 infection.  Patient is clinically stable.  She is having a slow to resolve COPD exacerbation associated with her viral infection.  She did finish a 5-day antiviral pack.  O2 saturations are stable.  No evidence of pneumonia on chest x-ray.  Continue with supportive care.  She is over 2 weeks from initial symptom onset.    Plan  Patient Instructions  Finish Augmentin twice daily for 7 days.  Take with food Mucinex DM twice daily as needed for cough and congestion Tessalon Perles 3 times daily as needed for cough Finish Prednisone taper over the next week Continue on Trelegy inhaler daily Albuterol inhaler or nebs as needed.  Follow-up in 4 weeks with Dr. Valeta Harms or October Peery NP and As needed   Please contact office for sooner follow up if symptoms do not improve or worsen or seek emergency care

## 2021-08-13 NOTE — Patient Instructions (Addendum)
Finish Augmentin twice daily for 7 days.  Take with food Mucinex DM twice daily as needed for cough and congestion Tessalon Perles 3 times daily as needed for cough Finish Prednisone taper over the next week Continue on Trelegy inhaler daily Albuterol inhaler or nebs as needed.  Follow-up in 4 weeks with Dr. Valeta Harms or Mikyle Sox NP and As needed   Please contact office for sooner follow up if symptoms do not improve or worsen or seek emergency care

## 2021-08-13 NOTE — Progress Notes (Signed)
$'@Patient'K$  ID: [name], female    DOB: 22-Apr-1941, 80 y.o.   MRN: [id number]  Chief Complaint  Patient presents with   Follow-up    Referring provider: Biagio Borg, MD  HPI: 80 year old female former smoker followed for severe COPD and chronic rhinitis.  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 difficile  Rheumatoid arthritis on Plaquenil   TEST/EVENTS :  10/10/16: FVC 1.94 L (70%) FEV1 0.93 L (45%) FEV1/FVC 0.48 FEF 25-75 0.35 L (22%) positive bronchodilator response 03/01/16: FVC 2.55 L (91%) FEV1 1.30 L (62%) FEV1/FVC 0.51 FEF 25-75 0.56 L (34%) negative bronchodilator response TLC 5.66 L (111%) RV 116% ERV 133% DLCO uncorrected 62% (hemoglobin 15.1) 12/28/11: FVC 2.89 L (104%) FEV1 1.22 L (62%) FEV1/FVC 0.42 FEF 25-75 0.33 L (15%) negative bronchodilator response TLC 6.19 L (129%) RV 163% ERV 114% DLCO uncorrected 69%   6MWT 10/10/16:  Walked 96 meters / Baseline Sat 93% on RA / Nadir Sat 93% on RA @ rest (couldn't finish due to weakness & dyspnea  W/ 2:57 left) 08/24/16:  Walked 1 lap & was 96% at lowest on room air   IMAGING CXR PA/LAT 06/19/17 :      CXR PA/LAT 05/18/17:  Patchy opacification in right middle and lower lobes. No pleural effusion appreciated. Heart normal in size & mediastinum normal in contour.   CT CHEST W/O 01/23/17: Resolution of groundglass opacity. 7 mm right lower lobe nodule unchanged. Apical predominant emphysematous changes. Right middle lobe nodule was not appreciated on this image. No pleural effusion or thickening. No pathologic mediastinal adenopathy. No  pericardial effusion.   CTA CHEST 08/17/16:  No evidence of PE. No pleural effusion or thickening. No pathologic mediastinal adenopathy. Groundglass noted within right lung predominantly in the upper lung zone. Moderate upper lobe predominant emphysema which has progressed since 2015. 7 mm right lower lobe nodule has not changed since 2015. Patient has a 4 mm nodule within right middle lobe which appears new.   CXR PA/LAT 05/25/16: Hyperinflation with flattening of the diaphragms. No parenchymal opacity or mass appreciated. No pleural effusion. Heart normal in size & mediastinum normal in contour.   CXR PA/LAT 03/22/16:  Hyperinflation with elevation of left hemidiaphragm. No new focal opacity. Heart normal in size & mediastinum normal in contour.   CXR PA/LAT 11/09/15:  No focal opacity or effusion appreciated. Heart normal in size. Mediastinum normal in contour. Mild hyperinflation with flattening of the diaphragms.    CARDIAC TTE (06/24/16): LVEF 45-50% with grade 2 diastolic dysfunction. LA moderately to severely dilated. RA normal in size. RV normal in size and function. No aortic stenosis or regurgitation. Moderate centrally directed mitral regurgitation. Paradoxical ventricular septal motion consistent with intraventricular conduction delay. No pulmonic regurgitation. No tricuspid regurgitation. No pericardial effusion.   MICROBIOLOGY Sputum Culture (07/05/17):  Candida Species / AFB negative / Moderate Squamous Epithelials    LABS 12/22/15 Alpha-1 antitrypsin: MM (143) 11/2017 IgE 9 (normal)  08/13/2021 Follow up : Covid 19 infection , COPD exacerbation  Patient presents for a follow-up visit.  2 weeks ago patient developed acute symptoms of cough and congestion.  She was seen at urgent care and  was positive for COVID-19.  She tested positive on August 24.  Developed symptoms on August 23.  She had a direct exposure with her husband who was positive a few days prior.  She was given Molnupiravir  and a steroid pack.  Patient continued to have ongoing cough and congestion.  She was called in a Z-Pak.  Patient was seen for a virtual visit on August 10, 2021 with ongoing symptoms of productive cough with thick mucus.  She also had been doing home COVID test at home and remained positive.  She denied any fever or hemoptysis.  O2 saturations have remained above 90%.  Patient was started on Augmentin for 7 days.  And given a prednisone taper.  Since her last visit earlier this week.  She says she is starting to feel better and turned the corner.  Decreased cough and congestion. Feels better. Mucus is now clear . Chest x-ray today shows COPD w/ no acute process.   She remains on Trelegy inhaler daily.  She has been fully vaccinated x4 for COVID-19       Allergies  Allergen Reactions   Clarithromycin Other (See Comments)    REACTION: possible rash but could have been legionarres dz with rash   Lipitor [Atorvastatin] Other (See Comments)    MUSCLE SPASMS   Simvastatin Other (See Comments)    Muscle and joint pain   Cardizem [Diltiazem Hcl] Hives   Contrast Media [Iodinated Diagnostic Agents]     FYI - during admission at [facility] 08/2016 patient developed hives, question related to cardizem or contrast dye - not clear   Crestor [Rosuvastatin Calcium]     fagitue and joint pain, "NO STATINS"   Rosuvastatin Calcium Other (See Comments)    fagitue and joint pain, "NO STATINS"    Immunization History  Administered Date(s) Administered   Fluad Quad(high Dose 65+) 08/08/2019, 08/17/2020   Influenza Split 09/11/2012   Influenza Whole 09/15/2006, 08/04/2010, 08/11/2011   Influenza, High Dose Seasonal PF 08/22/2017, 09/05/2018, 08/19/2020   Influenza,inj,Quad PF,6+ Mos 09/02/2013, 08/26/2014, 08/19/2015, 08/23/2016   PFIZER(Purple Top)SARS-COV-2 Vaccination 12/18/2019, 01/08/2020, 09/01/2020, 05/11/2021   Pneumococcal Conjugate-13 11/29/2013   Pneumococcal Polysaccharide-23  12/06/2003, 12/05/2006   Td 12/05/2004   Zoster Recombinat (Shingrix) 04/12/2017, 06/19/2017   Zoster, Live 07/07/2008    Past Medical History:  Diagnosis Date   Allergic rhinitis    Anxiety    Atrial septal aneurysm    Basal cell carcinoma    Bradycardia    Cataract    bil cataracts removed   Chronic combined systolic and diastolic CHF (congestive heart failure) (HCC)    CKD (chronic kidney disease), stage III ([place])    Clostridium difficile infection    COPD (chronic obstructive pulmonary disease) ([place])    Depression 11/29/2013   DI (detrusor instability)    Esophageal stricture    GERD (gastroesophageal reflux disease)    Hemorrhoids    Hiatal hernia    History of kidney stones    HLD (hyperlipidemia)    HTN (hypertension)    Ischemic colon ([place])    a. 08/2016 - adm to  [facility] with a ruptured colon s/p right hemicolectomy with ileostomy formation for ischemic and necrotic colon.    Lung nodule    a. followed by pulmonology   Mild CAD    a. minor nonobstructive CAD 08/22/16.   Nephrolithiasis    hx   NICM (nonischemic cardiomyopathy) (HCC)    Orthostatic hypotension    Osteoarthritis  PAF (paroxysmal atrial fibrillation) ([place])    a. diagnosed 07/2016 s/p DCCV 08/01/16 with recurrence, managed with rate control for now.    Pneumonia 2013   PVC's (premature ventricular contractions)    Shingles since Nov 18, 2013   on right arm from shoulder to wrist   Status post dilation of esophageal narrowing    Stroke [facility]) 2001   STROKES, TIA'S   Syncope    a. 08/2016 felt due to orthostatic hypotension.   TIA (transient ischemic attack) last 2001   anticoagulation therapy on coumadin    Tobacco History: Social History   Tobacco Use  Smoking Status Former   Packs/day: 3.00   Years: 42.00   Pack years: 126.00   Types: Cigarettes   Start date: 08/04/1954   Quit date: 12/06/1995   Years since quitting: 25.7  Smokeless Tobacco Never   Counseling given: Not  Answered   Outpatient Medications Prior to Visit  Medication Sig Dispense Refill   albuterol (VENTOLIN HFA) 108 (90 Base) MCG/ACT inhaler Inhale 1-2 puffs into the lungs every 6 (six) hours as needed for wheezing or shortness of breath. 18 g 3   amoxicillin-clavulanate (AUGMENTIN) 875-125 MG tablet Take 1 tablet by mouth 2 (two) times daily for 7 days. 14 tablet 0   apixaban (ELIQUIS) 5 MG TABS tablet Take 1 tablet (5 mg total) by mouth 2 (two) times daily. 180 tablet 3   azelastine (ASTELIN) 0.1 % nasal spray Place 2 sprays into the nose daily. Use in each nostril as directed 90 mL 0   benzonatate (TESSALON) 200 MG capsule Take 1 capsule (200 mg total) by mouth 3 (three) times daily as needed for cough. 30 capsule 1   buPROPion (WELLBUTRIN XL) 300 MG 24 hr tablet Take 1 tablet (300 mg total) by mouth daily. 90 tablet 3   Calcium Carbonate-Vitamin D (CALCIUM + D PO) Take 1 tablet by mouth 2 (two) times daily.     colestipol (COLESTID) 1 g tablet Take 2 tablets (2 g total) by mouth 2 (two) times daily. 360 tablet 3   cyclobenzaprine (FLEXERIL) 5 MG tablet Take 1 tablet (5 mg total) by mouth 3 (three) times daily as needed for muscle spasms. 270 tablet 1   fluticasone (FLONASE) 50 MCG/ACT nasal spray Place 1 spray into both nostrils 2 (two) times daily. 48 g 1   Fluticasone-Umeclidin-Vilant (TRELEGY ELLIPTA) 100-62.5-25 MCG/INH AEPB Inhale 1 puff into the lungs daily. I inhalation once daily 180 each 1   furosemide (LASIX) 20 MG tablet Take 1 tablet (20 mg total) by mouth daily as needed (swelling). 90 tablet 1   gabapentin (NEURONTIN) 300 MG capsule 1 tab by mouth in AM, 1 po at midday, then 2 by mouth at bedtime for sleep and pain 360 capsule 1   guaiFENesin (MUCINEX) 600 MG 12 hr tablet Take 1,200 mg by mouth 2 (two) times daily as needed for cough or to loosen phlegm.     hydroxychloroquine (PLAQUENIL) 200 MG tablet Take 400 mg by mouth at bedtime.     ipratropium (ATROVENT) 0.03 % nasal spray  Place 2 sprays into both nostrils 3 (three) times daily as needed for rhinitis. 90 mL 1   levothyroxine (SYNTHROID) 25 MCG tablet Take 1 tablet (25 mcg total) by mouth daily before breakfast. Annual appt due in Nov must see provider for future refills 90 tablet 3   Lidocaine HCl 2 % CREA Apply 1 application topically daily as needed (for shingles flares).  loperamide (IMODIUM) 2 MG capsule Take by mouth.     loratadine (CLARITIN) 10 MG tablet Take 10 mg by mouth daily.     Melatonin 5 MG TABS Take 5 mg by mouth at bedtime.     metoprolol succinate (TOPROL-XL) 100 MG 24 hr tablet Take 1 tablet (100 mg total) by mouth daily. Take with or immediately following a meal. 90 tablet 2   mirabegron ER (MYRBETRIQ) 50 MG TB24 tablet Take 50 mg by mouth daily.     montelukast (SINGULAIR) 10 MG tablet Take 1 tablet (10 mg total) by mouth daily. Annual appt due in Nov must see provider for future refills 90 tablet 1   Multiple Vitamin (MULTIVITAMIN) tablet Take 1 tablet by mouth daily.     NYSTATIN PO Take 5 mLs by mouth as needed.     omeprazole (PRILOSEC) 40 MG capsule Take 1 capsule (40 mg total) by mouth daily. 90 capsule 3   predniSONE (DELTASONE) 10 MG tablet 4 tabs for 2 days, then 3 tabs for 2 days, 2 tabs for 2 days, then 1 tab for 2 days, then stop 20 tablet 0   Probiotic Product (PROBIOTIC DAILY PO) Take by mouth in the morning and at bedtime.      traZODone (DESYREL) 50 MG tablet Take 1 tablet (50 mg total) by mouth at bedtime and may repeat dose one time if needed. 180 tablet 1   diphenhydrAMINE (BENADRYL) 50 MG tablet Take 1 tablet (50 mg total) by mouth once for 1 dose. Pt to take 50 mg of Benadryl pm 07/13/21 @ 9:20 AM. Please call [phone] with any questions. 1 tablet 0   ezetimibe (ZETIA) 10 MG tablet Take 1 tablet (10 mg total) by mouth daily. 90 tablet 1   azithromycin (ZITHROMAX) 250 MG tablet Take 2 tablets today and then 1 tablet daily until gone. 6 tablet 0   hydrOXYzine  (ATARAX/VISTARIL) 10 MG tablet Take 1 tablet (10 mg total) by mouth every 4 (four) hours as needed for itching. 30 tablet 0   nystatin (MYCOSTATIN) 100000 UNIT/ML suspension Take 5 mLs (500,000 Units total) by mouth 4 (four) times daily. 60 mL 0   predniSONE (DELTASONE) 50 MG tablet Pt to take 50 mg of prednisone on 07/12/21 at 920PM, 50 mg of prednisone on 07/13/21 at 0320AM, and 50 mg of prednisone on 07/13/21 at 0920AM. Pt is also to take 50 mg of benadryl on 07/13/21 at 0920AM. Please call [phone] with any questions. 3 tablet 0   No facility-administered medications prior to visit.     Review of Systems:   Constitutional:   No  weight loss, night sweats,  Fevers, chills,  +fatigue, or  lassitude.  HEENT:   No headaches,  Difficulty swallowing,  Tooth/dental problems, or  Sore throat,                No sneezing, itching, ear ache,  +nasal congestion, post nasal drip,   CV:  No chest pain,  Orthopnea, PND, swelling in lower extremities, anasarca, dizziness, palpitations, syncope.   GI  No heartburn, indigestion, abdominal pain, nausea, vomiting, diarrhea, change in bowel habits, loss of appetite, bloody stools.   Resp: .  No chest wall deformity  Skin: no rash or lesions.  GU: no dysuria, change in color of urine, no urgency or frequency.  No flank pain, no hematuria   MS:  No joint pain or swelling.  No decreased range of motion.  No back pain.  Physical Exam  BP 140/70 (BP Location: Left Arm, Patient Position: Sitting, Cuff Size: Normal)   Pulse 78   Temp 97.9 F (36.6 C) (Oral)   Ht '5\' 3"'$  (1.6 m)   Wt 126 lb 6.4 oz (57.3 kg)   SpO2 94%   BMI 22.39 kg/m   GEN: A/Ox3; pleasant , NAD, well nourished    HEENT:  [place]/AT,  NOSE-clear, THROAT-clear, no lesions, no postnasal drip or exudate noted.   NECK:  Supple w/ fair ROM; no JVD; normal carotid impulses w/o bruits; no thyromegaly or nodules palpated; no lymphadenopathy.    RESP  scattered rhonchi  no accessory muscle  use, no dullness to percussion  CARD:  RRR, no m/r/g, no peripheral edema, pulses intact, no cyanosis or clubbing.  GI:   Soft & nt; nml bowel sounds; no organomegaly or masses detected.   Musco: Warm bil, no deformities or joint swelling noted.   Neuro: alert, no focal deficits noted.    Skin: Warm, no lesions or rashes    Lab Results:      Imaging: DG Chest 2 View  Result Date: 08/13/2021 CLINICAL DATA:  Cough, post COVID EXAM: CHEST - 2 VIEW COMPARISON:  09/08/2020 FINDINGS: Heart size is within limits. Atherosclerotic calcification of the aortic knob. Hyperinflated lungs with chronically coarsened interstitial markings bilaterally. No focal airspace consolidation, pleural effusion, or pneumothorax. Multilevel lumbar fusion hardware is partially visualized. IMPRESSION: COPD. No acute cardiopulmonary findings. Electronically Signed   By: Davina Poke D.O.   On: 08/13/2021 12:36      PFT Results Latest Ref Rng & Units 10/10/2016 03/01/2016  FVC-Pre L 1.94 2.55  FVC-Predicted Pre % 70 91  FVC-Post L 2.17 2.72  FVC-Predicted Post % 78 97  Pre FEV1/FVC % % 48 51  Post FEV1/FCV % % 51 53  FEV1-Pre L 0.93 1.30  FEV1-Predicted Pre % 45 62  FEV1-Post L 1.11 1.44  DLCO uncorrected ml/min/mmHg - 16.22  DLCO UNC% % - 65  DLCO corrected ml/min/mmHg - 15.47  DLCO COR %Predicted % - 62  DLVA Predicted % - 71  TLC L - 5.66  TLC % Predicted % - 111  RV % Predicted % - 116    No results found for: NITRICOXIDE      Assessment & Plan:   COPD exacerbation (HCC) Slow to resolve exacerbation with recent COVID-19 infection.  She is starting to improve on current course of antibiotics and steroids.  Chest x-ray today shows no acute process.  O2 saturations are normal.  Patient is continue on her current regimen.  Use albuterol nebulizer as needed.  And continue on Mucinex twice daily.  Plan  Patient Instructions  Finish Augmentin twice daily for 7 days.  Take with food Mucinex  DM twice daily as needed for cough and congestion Tessalon Perles 3 times daily as needed for cough Finish Prednisone taper over the next week Continue on Trelegy inhaler daily Albuterol inhaler or nebs as needed.  Follow-up in 4 weeks with Dr. Valeta Harms or Lillionna Nabi NP and As needed   Please contact office for sooner follow up if symptoms do not improve or worsen or seek emergency care       Combined congestive systolic and diastolic heart failure ([place]) Appears euvolemic with no evidence of volume overload on exam.  Continue on current regimen  COVID-19 virus infection COVID-19 infection.  Patient is clinically stable.  She is having a slow to resolve COPD exacerbation associated with her viral infection.  She did finish a 5-day antiviral pack.  O2 saturations are stable.  No evidence of pneumonia on chest x-ray.  Continue with supportive care.  She is over 2 weeks from initial symptom onset.    Plan  Patient Instructions  Finish Augmentin twice daily for 7 days.  Take with food Mucinex DM twice daily as needed for cough and congestion Tessalon Perles 3 times daily as needed for cough Finish Prednisone taper over the next week Continue on Trelegy inhaler daily Albuterol inhaler or nebs as needed.  Follow-up in 4 weeks with Dr. Valeta Harms or Josslynn Mentzer NP and As needed   Please contact office for sooner follow up if symptoms do not improve or worsen or seek emergency care         Rexene Edison, NP 08/13/2021

## 2021-08-13 NOTE — Assessment & Plan Note (Signed)
Appears euvolemic with no evidence of volume overload on exam.  Continue on current regimen

## 2021-09-03 ENCOUNTER — Telehealth: Payer: Self-pay | Admitting: Adult Health

## 2021-09-03 NOTE — Telephone Encounter (Signed)
Spoke to patient via telephone.  C/o cough and sob with coughing spell. . Cough is deep and productive with thick clear sputum. She stated that her cough sounds like a 'foghorn'. Cough has been present since 07/28/2021. Denied fever, chills, sweats or additional sx.  Negative covid test 2 weeks ago.  Fully vaccinated against covid.  She is using albuterol solution BID, albuterol HFA TID, flutter BID and mucinex once daily and Trelegy once daily. She has not been using tessalon. She completed course of Augmentin 2 weeks ago. She stated that she can not take another round of abx due to diarrhea for 3 weeks.   Tammy, please advise. Thanks

## 2021-09-03 NOTE — Telephone Encounter (Signed)
Sorry she is not better. She has had 2 abx/steroids  Has been seen x2 .  Hold on additional steroids  CXR clear  On Eliquis low likely for PE .  Set up for HRCT chest - ? Pneumonitis post covid    Tried to call no answer.   Begin Robitussin DM 2 tsp every 4hrs for cough , Tessalon Three times a day  for cough As needed    May use Hydromet 1 tsp At bedtime  for severe cough - if not allergic  Will make sleepy use with caution .  #120cc no refills. I will have to send if she wants this .   Please contact office for sooner follow up if symptoms do not improve or worsen or seek emergency care    Keep follow up with [facility]

## 2021-09-03 NOTE — Telephone Encounter (Signed)
Called patient but she did not answer. Left message for patient to call back.  

## 2021-09-04 DIAGNOSIS — Z20822 Contact with and (suspected) exposure to covid-19: Secondary | ICD-10-CM | POA: Diagnosis not present

## 2021-09-09 NOTE — Telephone Encounter (Signed)
LMTCB   Patient has an appt 09/16/21

## 2021-09-10 ENCOUNTER — Other Ambulatory Visit: Payer: Self-pay

## 2021-09-10 ENCOUNTER — Encounter: Payer: Self-pay | Admitting: Internal Medicine

## 2021-09-10 MED ORDER — FUROSEMIDE 20 MG PO TABS: 20.0000 mg | ORAL_TABLET | Freq: Every day | ORAL | 1 refills | Status: DC | PRN

## 2021-09-10 NOTE — Progress Notes (Signed)
Refilled medication

## 2021-09-16 ENCOUNTER — Other Ambulatory Visit: Payer: Self-pay

## 2021-09-16 ENCOUNTER — Encounter: Payer: Self-pay | Admitting: Pulmonary Disease

## 2021-09-16 ENCOUNTER — Ambulatory Visit (INDEPENDENT_AMBULATORY_CARE_PROVIDER_SITE_OTHER): Payer: Medicare Other | Admitting: Pulmonary Disease

## 2021-09-16 VITALS — BP 118/62 | HR 67 | Temp 97.7°F | Ht 64.0 in | Wt 128.2 lb

## 2021-09-16 DIAGNOSIS — Z23 Encounter for immunization: Secondary | ICD-10-CM

## 2021-09-16 DIAGNOSIS — U071 COVID-19: Secondary | ICD-10-CM

## 2021-09-16 DIAGNOSIS — J449 Chronic obstructive pulmonary disease, unspecified: Secondary | ICD-10-CM | POA: Diagnosis not present

## 2021-09-16 DIAGNOSIS — R911 Solitary pulmonary nodule: Secondary | ICD-10-CM | POA: Diagnosis not present

## 2021-09-16 DIAGNOSIS — J441 Chronic obstructive pulmonary disease with (acute) exacerbation: Secondary | ICD-10-CM | POA: Diagnosis not present

## 2021-09-16 DIAGNOSIS — I5042 Chronic combined systolic (congestive) and diastolic (congestive) heart failure: Secondary | ICD-10-CM

## 2021-09-16 DIAGNOSIS — R918 Other nonspecific abnormal finding of lung field: Secondary | ICD-10-CM | POA: Diagnosis not present

## 2021-09-16 NOTE — Progress Notes (Signed)
Synopsis: Referred in October 2022 for establish care with new pulmonary provider by Biagio Borg, MD  Subjective:   PATIENT ID: [name]: female DOB: 09-15-1941, MRN: [id number]  Chief Complaint  Patient presents with   Follow-up    This is an 80 year old female longstanding history of smoking, quit 1996, has moderate COPD with evidence of emphysema at baseline.  She has a small hiatal hernia, hyperlipidemia, hypertension, PAF.  Previous imaging for lung nodule showed stability.  However last axial CT imaging of the chest was in 2018 which revealed a bilateral lower lobe groundglass subsolid type lesion.  She is not had images for follow-up since then.  We reviewed these images today in the office.  From respiratory standpoint her COPD is managed with Trelegy 100.  She does okay with as needed albuterol as well.  Able to complete most activities of daily living she.  She does ambulate with a cane.   Past Medical History:  Diagnosis Date   Allergic rhinitis    Anxiety    Atrial septal aneurysm    Basal cell carcinoma    Bradycardia    Cataract    bil cataracts removed   Chronic combined systolic and diastolic CHF (congestive heart failure) (HCC)    CKD (chronic kidney disease), stage III (HCC)    Clostridium difficile infection    COPD (chronic obstructive pulmonary disease) ([place])    Depression 11/29/2013   DI (detrusor instability)    Esophageal stricture    GERD (gastroesophageal reflux disease)    Hemorrhoids    Hiatal hernia    History of kidney stones    HLD (hyperlipidemia)    HTN (hypertension)    Ischemic colon ([place])    a. 08/2016 - adm to  [facility] with a ruptured colon s/p right hemicolectomy with ileostomy formation for ischemic and necrotic colon.    Lung nodule    a. followed by pulmonology   Mild CAD    a. minor nonobstructive CAD 08/22/16.   Nephrolithiasis    hx   NICM (nonischemic cardiomyopathy) (HCC)    Orthostatic hypotension     Osteoarthritis    PAF (paroxysmal atrial fibrillation) ([place])    a. diagnosed 07/2016 s/p DCCV 08/01/16 with recurrence, managed with rate control for now.    Pneumonia 2013   PVC's (premature ventricular contractions)    Shingles since Nov 18, 2013   on right arm from shoulder to wrist   Status post dilation of esophageal narrowing    Stroke [facility]) 2001   STROKES, TIA'S   Syncope    a. 08/2016 felt due to orthostatic hypotension.   TIA (transient ischemic attack) last 2001   anticoagulation therapy on coumadin     Family History  Problem Relation Age of Onset   COPD Mother    Breast cancer Mother        mid 78's   Uterine cancer Mother    Emphysema Mother    Breast cancer Maternal Aunt        x 3 aunts, post menopausal   Breast cancer Maternal Aunt    Breast cancer Maternal Aunt    Colon cancer Neg Hx    Esophageal cancer Neg Hx    Rectal cancer Neg Hx    Stomach cancer Neg Hx    Pancreatic cancer Neg Hx      Past Surgical History:  Procedure Laterality Date   ANTERIOR LATERAL LUMBAR FUSION 4 LEVELS Right 07/03/2015   Procedure:  Right Lumbar one-two, Lumbar two-three, Lumbar three-four, Lumbar four-five Anterior lateral lumbar interbody fusion;  Surgeon: Erline Levine, MD;  Location: [place] NEURO ORS;  Service: Neurosurgery;  Laterality: Right;  Right L1-2 L2-3 L3-4 L4-5 Anterior lateral lumbar interbody fusion   BALLOON DILATION N/A 03/03/2014   Procedure: BALLOON DILATION;  Surgeon: Inda Castle, MD;  Location: [facility] ENDOS[facility];  Service: Endoscopy;  Laterality: N/A;   BLADDER SUSPENSION     BREAST BIOPSY Right 07/03/2006   CARDIAC CATHETERIZATION N/A 08/22/2016   Procedure: Left Heart Cath and Coronary Angiography;  Surgeon: Peter M Martinique, MD;  Location: [place] CV LAB;  Service: Cardiovascular;  Laterality: N/A;   CARDIOVERSION N/A 08/01/2016   Procedure: CARDIOVERSION;  Surgeon: Jerline Pain, MD;  Location: [place];  Service: Cardiovascular;  Laterality: N/A;    CARDIOVERSION N/A 10/11/2016   Procedure: CARDIOVERSION;  Surgeon: Sueanne Margarita, MD;  Location: [facility];  Service: Cardiovascular;  Laterality: N/A;   COLONOSCOPY     ESOPHAGOGASTRODUODENOSCOPY N/A 03/03/2014   Procedure: ESOPHAGOGASTRODUODENOSCOPY (EGD);  Surgeon: Inda Castle, MD;  Location: [facility];  Service: Endoscopy;  Laterality: N/A;   EXCISION OF BASAL CELL CA  2011   FINGER SURGERY     GLUTEUS MINIMUS REPAIR Right 03/15/2016   Procedure: RIGHT HIP GLUTEUS MEDIUS TENDON REPAIR;  Surgeon: Paralee Cancel, MD;  Location: [facility];  Service: Orthopedics;  Laterality: Right;   ileostomy reversal     LUMBAR PERCUTANEOUS PEDICLE SCREW 4 LEVEL Right 07/03/2015   Procedure: Lumbar one-Sacral one Bilateral percutaneous pedicle screws;  Surgeon: Erline Levine, MD;  Location: [place] NEURO ORS;  Service: Neurosurgery;  Laterality: Right;  L1-S1 Bilateral percutaneous pedicle screws   RECTOCELE REPAIR  2008   A&P REPAIR with vaginal repair-DR. MCDIARMID   THUMB SURGERY Right 10-15 yrs ago   UPPER GASTROINTESTINAL ENDOSCOPY     VAGINAL HYSTERECTOMY  1983   NO BSO-DR. MABRY   VARICOSE VEIN SURGERY      Social History   Socioeconomic History   Marital status: Married    Spouse name: Shanon Brow   Number of children: 2   Years of education: Not on file   Highest education level: Not on file  Occupational History   Occupation: retired    Fish farm manager: RETIRED  Tobacco Use   Smoking status: Former    Packs/day: 3.00    Years: 42.00    Pack years: 126.00    Types: Cigarettes    Start date: 08/04/1954    Quit date: 12/06/1995    Years since quitting: 25.7   Smokeless tobacco: Never  Vaping Use   Vaping Use: Never used  Substance and Sexual Activity   Alcohol use: No    Alcohol/week: 0.0 standard drinks   Drug use: No   Sexual activity: Not Currently    Birth control/protection: Surgical    Comment: 1st intercourse 80 yo-Fewer than 5 partners  Other Topics Concern   Not on file  Social  History Narrative   Retired- Investment banker, operational.       Financial controller Pulmonary:   Originally from [place]. Has always lived in [place]. She has traveled to [place], [place], [place], [place], [place], [place]. Has been on multiple cruises. 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 She did use a face mask consistently to avoid chemical fume exposure. She may have only had an inhaled exposure a couple of times. She currently has a dog. Remote exposure to  a parakeet. No mold or hot tub exposure. No asbestos exposure.    Social Determinants of Health   Financial Resource Strain: Not on file  Food Insecurity: Not on file  Transportation Needs: Not on file  Physical Activity: Not on file  Stress: Not on file  Social Connections: Not on file  Intimate Partner Violence: Not on file     Allergies  Allergen Reactions   Clarithromycin Other (See Comments)    REACTION: possible rash but could have been legionarres dz with rash   Lipitor [Atorvastatin] Other (See Comments)    MUSCLE SPASMS   Simvastatin Other (See Comments)    Muscle and joint pain   Cardizem [Diltiazem Hcl] Hives   Contrast Media [Iodinated Diagnostic Agents]     FYI - during admission at [facility] 08/2016 patient developed hives, question related to cardizem or contrast dye - not clear   Crestor [Rosuvastatin Calcium]     fagitue and joint pain, "NO STATINS"   Rosuvastatin Calcium Other (See Comments)    fagitue and joint pain, "NO STATINS"     Outpatient Medications Prior to Visit  Medication Sig Dispense Refill   albuterol (VENTOLIN HFA) 108 (90 Base) MCG/ACT inhaler Inhale 1-2 puffs into the lungs every 6 (six) hours as needed for wheezing or shortness of breath. 18 g 3   apixaban (ELIQUIS) 5 MG TABS tablet Take 1 tablet (5 mg total) by mouth 2 (two) times daily. 180 tablet 3   azelastine (ASTELIN) 0.1 % nasal spray Place 2 sprays into the nose  daily. Use in each nostril as directed 90 mL 0   benzonatate (TESSALON) 200 MG capsule Take 1 capsule (200 mg total) by mouth 3 (three) times daily as needed for cough. 30 capsule 1   buPROPion (WELLBUTRIN XL) 300 MG 24 hr tablet Take 1 tablet (300 mg total) by mouth daily. 90 tablet 3   Calcium Carbonate-Vitamin D (CALCIUM + D PO) Take 1 tablet by mouth 2 (two) times daily.     colestipol (COLESTID) 1 g tablet Take 2 tablets (2 g total) by mouth 2 (two) times daily. 360 tablet 3   cyclobenzaprine (FLEXERIL) 5 MG tablet Take 1 tablet (5 mg total) by mouth 3 (three) times daily as needed for muscle spasms. 270 tablet 1   fluticasone (FLONASE) 50 MCG/ACT nasal spray Place 1 spray into both nostrils 2 (two) times daily. 48 g 1   Fluticasone-Umeclidin-Vilant (TRELEGY ELLIPTA) 100-62.5-25 MCG/INH AEPB Inhale 1 puff into the lungs daily. I inhalation once daily 180 each 1   furosemide (LASIX) 20 MG tablet Take 1 tablet (20 mg total) by mouth daily as needed (swelling). 90 tablet 1   gabapentin (NEURONTIN) 300 MG capsule 1 tab by mouth in AM, 1 po at midday, then 2 by mouth at bedtime for sleep and pain 360 capsule 1   guaiFENesin (MUCINEX) 600 MG 12 hr tablet Take 1,200 mg by mouth 2 (two) times daily as needed for cough or to loosen phlegm.     hydroxychloroquine (PLAQUENIL) 200 MG tablet Take 400 mg by mouth at bedtime.     ipratropium (ATROVENT) 0.03 % nasal spray Place 2 sprays into both nostrils 3 (three) times daily as needed for rhinitis. 90 mL 1   levothyroxine (SYNTHROID) 25 MCG tablet Take 1 tablet (25 mcg total) by mouth daily before breakfast. Annual appt due in Nov must see provider for future refills 90 tablet 3   Lidocaine HCl 2 % CREA Apply 1 application  topically daily as needed (for shingles flares).     loperamide (IMODIUM) 2 MG capsule Take by mouth.     loratadine (CLARITIN) 10 MG tablet Take 10 mg by mouth daily.     Melatonin 5 MG TABS Take 5 mg by mouth at bedtime.     metoprolol  succinate (TOPROL-XL) 100 MG 24 hr tablet Take 1 tablet (100 mg total) by mouth daily. Take with or immediately following a meal. 90 tablet 2   mirabegron ER (MYRBETRIQ) 50 MG TB24 tablet Take 50 mg by mouth daily.     montelukast (SINGULAIR) 10 MG tablet Take 1 tablet (10 mg total) by mouth daily. Annual appt due in Nov must see provider for future refills 90 tablet 1   Multiple Vitamin (MULTIVITAMIN) tablet Take 1 tablet by mouth daily.     NYSTATIN PO Take 5 mLs by mouth as needed.     omeprazole (PRILOSEC) 40 MG capsule Take 1 capsule (40 mg total) by mouth daily. 90 capsule 3   predniSONE (DELTASONE) 10 MG tablet 4 tabs for 2 days, then 3 tabs for 2 days, 2 tabs for 2 days, then 1 tab for 2 days, then stop 20 tablet 0   Probiotic Product (PROBIOTIC DAILY PO) Take by mouth in the morning and at bedtime.      traZODone (DESYREL) 50 MG tablet Take 1 tablet (50 mg total) by mouth at bedtime and may repeat dose one time if needed. 180 tablet 1   diphenhydrAMINE (BENADRYL) 50 MG tablet Take 1 tablet (50 mg total) by mouth once for 1 dose. Pt to take 50 mg of Benadryl pm 07/13/21 @ 9:20 AM. Please call [phone] with any questions. 1 tablet 0   ezetimibe (ZETIA) 10 MG tablet Take 1 tablet (10 mg total) by mouth daily. 90 tablet 1   No facility-administered medications prior to visit.    Review of Systems  Constitutional:  Negative for chills, fever, malaise/fatigue and weight loss.  HENT:  Negative for hearing loss, sore throat and tinnitus.   Eyes:  Negative for blurred vision and double vision.  Respiratory:  Positive for shortness of breath. Negative for cough, hemoptysis, sputum production, wheezing and stridor.   Cardiovascular:  Negative for chest pain, palpitations, orthopnea, leg swelling and PND.  Gastrointestinal:  Negative for abdominal pain, constipation, diarrhea, heartburn, nausea and vomiting.  Genitourinary:  Negative for dysuria, hematuria and urgency.  Musculoskeletal:   Negative for joint pain and myalgias.  Skin:  Negative for itching and rash.  Neurological:  Negative for dizziness, tingling, weakness and headaches.  Endo/Heme/Allergies:  Negative for environmental allergies. Does not bruise/bleed easily.  Psychiatric/Behavioral:  Negative for depression. The patient is not nervous/anxious and does not have insomnia.   All other systems reviewed and are negative.   Objective:  Physical Exam Vitals reviewed.  Constitutional:      General: She is not in acute distress.    Appearance: She is well-developed.  HENT:     Head: Normocephalic and atraumatic.  Eyes:     General: No scleral icterus.    Conjunctiva/sclera: Conjunctivae normal.     Pupils: Pupils are equal, round, and reactive to light.  Neck:     Vascular: No JVD.     Trachea: No tracheal deviation.  Cardiovascular:     Rate and Rhythm: Normal rate and regular rhythm.     Heart sounds: Normal heart sounds. No murmur heard. Pulmonary:     Effort: Pulmonary effort is normal. No  tachypnea, accessory muscle usage or respiratory distress.     Breath sounds: No stridor. No wheezing, rhonchi or rales.     Comments: Diminished breath sounds bilaterally Abdominal:     General: There is no distension.     Palpations: Abdomen is soft.     Tenderness: There is no abdominal tenderness.  Musculoskeletal:        General: No tenderness.     Cervical back: Neck supple.  Lymphadenopathy:     Cervical: No cervical adenopathy.  Skin:    General: Skin is warm and dry.     Capillary Refill: Capillary refill takes less than 2 seconds.     Findings: No rash.  Neurological:     Mental Status: She is alert and oriented to person, place, and time.  Psychiatric:        Behavior: Behavior normal.     Vitals:   09/16/21 1015  BP: 118/62  Pulse: 67  Temp: 97.7 F (36.5 C)  TempSrc: Oral  SpO2: 94%  Weight: 128 lb 3.2 oz (58.2 kg)  Height: 5\' 4"  (1.626 m)   94% on RA BMI Readings from Last 3  Encounters:  09/16/21 22.01 kg/m  08/13/21 22.39 kg/m  05/20/21 21.28 kg/m   Wt Readings from Last 3 Encounters:  09/16/21 128 lb 3.2 oz (58.2 kg)  08/13/21 126 lb 6.4 oz (57.3 kg)  05/20/21 124 lb (56.2 kg)        Component Value Date/Time   WBC 4.9 05/13/2021 1025   RBC 4.51 05/13/2021 1025   HGB 12.5 05/13/2021 1025   HCT 37.3 05/13/2021 1025   PLT 159.0 05/13/2021 1025   MCV 82.6 05/13/2021 1025   MCH 24.8 (L) 09/16/2016 1648   MCHC 33.6 05/13/2021 1025   RDW 14.8 05/13/2021 1025   LYMPHSABS 1.4 05/13/2021 1025   MONOABS 0.9 05/13/2021 1025   EOSABS 0.1 05/13/2021 1025   BASOSABS 0.1 05/13/2021 1025      Chest Imaging:  CT chest 2018: Bilateral lower lobe groundglass of solid type lesions that were noted at that time.  Also right-sided nodule stable. The patient's images have been independently reviewed by me.    Pulmonary Functions Testing Results: PFT Results Latest Ref Rng & Units 10/10/2016 03/01/2016  FVC-Pre L 1.94 2.55  FVC-Predicted Pre % 70 91  FVC-Post L 2.17 2.72  FVC-Predicted Post % 78 97  Pre FEV1/FVC % % 48 51  Post FEV1/FCV % % 51 53  FEV1-Pre L 0.93 1.30  FEV1-Predicted Pre % 45 62  FEV1-Post L 1.11 1.44  DLCO uncorrected ml/min/mmHg - 16.22  DLCO UNC% % - 65  DLCO corrected ml/min/mmHg - 15.47  DLCO COR %Predicted % - 62  DLVA Predicted % - 71  TLC L - 5.66  TLC % Predicted % - 111  RV % Predicted % - 116    FeNO:   Pathology:   Echocardiogram:   Heart Catheterization:     Assessment & Plan:     ICD-10-CM   1. Ground glass opacity present on imaging of lung  R91.8 CT Super D Chest Wo Contrast    2. Lung nodule  R91.1 CT Super D Chest Wo Contrast    3. COPD exacerbation ([place])  J44.1     4. COVID-19 virus infection  U07.1     5. COPD, moderate ([place])  J44.9     6. Chronic combined systolic and diastolic congestive heart failure (HCC)  I50.42     7. Needs flu  shot  Z23       Discussion:  This 80 year old female,  moderate COPD, evidence of emphysema on imaging.  She needs a flu shot today.  Previous CT had a stable lung nodule.  She does have these lower lobe irregular shaped lesions seen on CT in 2018.  I will see that she had any axial imaging for follow-up of these.  I think she needs this looked at again due to her longstanding history of smoking.  Plan: Repeat CT scan of the chest.  I have ordered this. Trelegy continue as she is doing well on triple therapy inhaler regimen. Continue albuterol for shortness of breath and wheezing. Flu shot today. She has been recovering nicely from [place].  Continue supportive care and encouraged her to continue to stay active. Follow-up with me or Tammy in 6 months or as needed.   Current Outpatient Medications:    albuterol (VENTOLIN HFA) 108 (90 Base) MCG/ACT inhaler, Inhale 1-2 puffs into the lungs every 6 (six) hours as needed for wheezing or shortness of breath., Disp: 18 g, Rfl: 3   apixaban (ELIQUIS) 5 MG TABS tablet, Take 1 tablet (5 mg total) by mouth 2 (two) times daily., Disp: 180 tablet, Rfl: 3   azelastine (ASTELIN) 0.1 % nasal spray, Place 2 sprays into the nose daily. Use in each nostril as directed, Disp: 90 mL, Rfl: 0   benzonatate (TESSALON) 200 MG capsule, Take 1 capsule (200 mg total) by mouth 3 (three) times daily as needed for cough., Disp: 30 capsule, Rfl: 1   buPROPion (WELLBUTRIN XL) 300 MG 24 hr tablet, Take 1 tablet (300 mg total) by mouth daily., Disp: 90 tablet, Rfl: 3   Calcium Carbonate-Vitamin D (CALCIUM + D PO), Take 1 tablet by mouth 2 (two) times daily., Disp: , Rfl:    colestipol (COLESTID) 1 g tablet, Take 2 tablets (2 g total) by mouth 2 (two) times daily., Disp: 360 tablet, Rfl: 3   cyclobenzaprine (FLEXERIL) 5 MG tablet, Take 1 tablet (5 mg total) by mouth 3 (three) times daily as needed for muscle spasms., Disp: 270 tablet, Rfl: 1   fluticasone (FLONASE) 50 MCG/ACT nasal spray, Place 1 spray into both nostrils 2 (two) times  daily., Disp: 48 g, Rfl: 1   Fluticasone-Umeclidin-Vilant (TRELEGY ELLIPTA) 100-62.5-25 MCG/INH AEPB, Inhale 1 puff into the lungs daily. I inhalation once daily, Disp: 180 each, Rfl: 1   furosemide (LASIX) 20 MG tablet, Take 1 tablet (20 mg total) by mouth daily as needed (swelling)., Disp: 90 tablet, Rfl: 1   gabapentin (NEURONTIN) 300 MG capsule, 1 tab by mouth in AM, 1 po at midday, then 2 by mouth at bedtime for sleep and pain, Disp: 360 capsule, Rfl: 1   guaiFENesin (MUCINEX) 600 MG 12 hr tablet, Take 1,200 mg by mouth 2 (two) times daily as needed for cough or to loosen phlegm., Disp: , Rfl:    hydroxychloroquine (PLAQUENIL) 200 MG tablet, Take 400 mg by mouth at bedtime., Disp: , Rfl:    ipratropium (ATROVENT) 0.03 % nasal spray, Place 2 sprays into both nostrils 3 (three) times daily as needed for rhinitis., Disp: 90 mL, Rfl: 1   levothyroxine (SYNTHROID) 25 MCG tablet, Take 1 tablet (25 mcg total) by mouth daily before breakfast. Annual appt due in Nov must see provider for future refills, Disp: 90 tablet, Rfl: 3   Lidocaine HCl 2 % CREA, Apply 1 application topically daily as needed (for shingles flares)., Disp: , Rfl:  loperamide (IMODIUM) 2 MG capsule, Take by mouth., Disp: , Rfl:    loratadine (CLARITIN) 10 MG tablet, Take 10 mg by mouth daily., Disp: , Rfl:    Melatonin 5 MG TABS, Take 5 mg by mouth at bedtime., Disp: , Rfl:    metoprolol succinate (TOPROL-XL) 100 MG 24 hr tablet, Take 1 tablet (100 mg total) by mouth daily. Take with or immediately following a meal., Disp: 90 tablet, Rfl: 2   mirabegron ER (MYRBETRIQ) 50 MG TB24 tablet, Take 50 mg by mouth daily., Disp: , Rfl:    montelukast (SINGULAIR) 10 MG tablet, Take 1 tablet (10 mg total) by mouth daily. Annual appt due in Nov must see provider for future refills, Disp: 90 tablet, Rfl: 1   Multiple Vitamin (MULTIVITAMIN) tablet, Take 1 tablet by mouth daily., Disp: , Rfl:    NYSTATIN PO, Take 5 mLs by mouth as needed., Disp: ,  Rfl:    omeprazole (PRILOSEC) 40 MG capsule, Take 1 capsule (40 mg total) by mouth daily., Disp: 90 capsule, Rfl: 3   predniSONE (DELTASONE) 10 MG tablet, 4 tabs for 2 days, then 3 tabs for 2 days, 2 tabs for 2 days, then 1 tab for 2 days, then stop, Disp: 20 tablet, Rfl: 0   Probiotic Product (PROBIOTIC DAILY PO), Take by mouth in the morning and at bedtime. , Disp: , Rfl:    traZODone (DESYREL) 50 MG tablet, Take 1 tablet (50 mg total) by mouth at bedtime and may repeat dose one time if needed., Disp: 180 tablet, Rfl: 1   diphenhydrAMINE (BENADRYL) 50 MG tablet, Take 1 tablet (50 mg total) by mouth once for 1 dose. Pt to take 50 mg of Benadryl pm 07/13/21 @ 9:20 AM. Please call [phone] with any questions., Disp: 1 tablet, Rfl: 0   ezetimibe (ZETIA) 10 MG tablet, Take 1 tablet (10 mg total) by mouth daily., Disp: 90 tablet, Rfl: 1    Garner Nash, DO [facility] 09/16/2021 10:34 AM

## 2021-09-16 NOTE — Patient Instructions (Signed)
Thank you for visiting Dr. Valeta Harms at [facility]. Today we recommend the following:  Orders Placed This Encounter  Procedures   CT Super D Chest Wo Contrast   Flu shot today  Continue trelegy  Continue albuterol as needed   Return in about 6 months (around 03/17/2022) for with Tammy Parrett or Dr. Valeta Harms.    Please do your part to reduce the spread of COVID-19.

## 2021-09-22 NOTE — Telephone Encounter (Signed)
Patient was seen by BI on 09/16/21. Will close this encounter.

## 2021-09-24 ENCOUNTER — Encounter: Payer: Self-pay | Admitting: Internal Medicine

## 2021-09-24 MED ORDER — TRAZODONE HCL 50 MG PO TABS: 50.0000 mg | ORAL_TABLET | Freq: Every evening | ORAL | 1 refills | Status: AC | PRN

## 2021-09-24 MED ORDER — METOPROLOL SUCCINATE ER 100 MG PO TB24: 100.0000 mg | ORAL_TABLET | Freq: Every day | ORAL | 3 refills | Status: AC

## 2021-09-28 ENCOUNTER — Encounter: Payer: Self-pay | Admitting: Internal Medicine

## 2021-09-28 ENCOUNTER — Ambulatory Visit (INDEPENDENT_AMBULATORY_CARE_PROVIDER_SITE_OTHER): Payer: Medicare Other | Admitting: Internal Medicine

## 2021-09-28 VITALS — BP 138/60 | HR 69 | Ht 64.0 in | Wt 129.0 lb

## 2021-09-28 DIAGNOSIS — R159 Full incontinence of feces: Secondary | ICD-10-CM

## 2021-09-28 DIAGNOSIS — K2100 Gastro-esophageal reflux disease with esophagitis, without bleeding: Secondary | ICD-10-CM | POA: Diagnosis not present

## 2021-09-28 DIAGNOSIS — K6289 Other specified diseases of anus and rectum: Secondary | ICD-10-CM

## 2021-09-28 DIAGNOSIS — R197 Diarrhea, unspecified: Secondary | ICD-10-CM | POA: Diagnosis not present

## 2021-09-28 MED ORDER — DILTIAZEM GEL 2 %: 1.0000 "application " | Freq: Every day | CUTANEOUS | 1 refills | Status: DC

## 2021-09-28 NOTE — Patient Instructions (Signed)
If you are age 82 or older, your body mass index should be between 23-30. Your Body mass index is 22.14 kg/m. If this is out of the aforementioned range listed, please consider follow up with your Primary Care Provider.  If you are age 36 or younger, your body mass index should be between 19-25. Your Body mass index is 22.14 kg/m. If this is out of the aformentioned range listed, please consider follow up with your Primary Care Provider.   ________________________________________________________  The [facility] providers would like to encourage you to use [facility] to communicate with providers for non-urgent requests or questions.  Due to long hold times on the telephone, sending your provider a message by [facility] may be a faster and more efficient way to get a response.  Please allow 48 business hours for a response.  Please remember that this is for non-urgent requests.  _______________________________________________________  Your provider has prescribed Diltiazem gel for you. Please follow the directions written on your prescription bottle or given to you specifically by your provider. Since this is a specialty medication and is not readily available at most local pharmacies, we have sent your prescription to:  [facility] information is below: Address: [address], [place], [place] [address]  [phone]  *Please DO NOT go directly from our office to pick up this medication! Give the pharmacy 1 day to process the prescription as this is compounded and takes time to make.  Take 2 tablespoons of Metamucil daily  Sitz Baths

## 2021-09-28 NOTE — Progress Notes (Signed)
HISTORY OF PRESENT ILLNESS:  [name] is a 80 y.o. female with a history of diarrhea after colostomy reversal, intermittent issues with urgency and nocturnal incontinence, increased intestinal gas, and GERD complicated by peptic stricture.  She presents today regarding problems with worsening diarrhea (transiently),, right lower quadrant pain, and rectal pain with bowel movement.  I last saw the patient February 2022.  See that dictation for details.  She was stable until early September when she developed COVID.  She tells me she was placed on 2 antibiotics.  Thereafter had severe problems with diarrhea.  Issues with diarrhea have resolved.  She now has occasional diarrhea.  Occasionally no bowel movement.  Occasionally formed bowel movements.  However, over the past month she has had rectal pain with each bowel movement.  She does have a history of hemorrhoids.  She has had some minor rectal bleeding.  She has a history of GERD complicated by peptic stricture.  Currently asymptomatic post dilation on omeprazole she is accompanied today by her husband.  Review of blood work from June 2022 shows normal CBC with hemoglobin 12.5.  REVIEW OF SYSTEMS:  All non-GI ROS negative as otherwise stated in the HPI except for arthritis, back pain, headaches, urinary leakage, fatigue, cough  Past Medical History:  Diagnosis Date   Allergic rhinitis    Anxiety    Atrial septal aneurysm    Basal cell carcinoma    Bradycardia    Cataract    bil cataracts removed   Chronic combined systolic and diastolic CHF (congestive heart failure) (HCC)    CKD (chronic kidney disease), stage III (HCC)    Clostridium difficile infection    COPD (chronic obstructive pulmonary disease) ([place])    Depression 11/29/2013   DI (detrusor instability)    Esophageal stricture    GERD (gastroesophageal reflux disease)    Hemorrhoids    Hiatal hernia    History of kidney stones    HLD (hyperlipidemia)    HTN (hypertension)     Ischemic colon ([place])    a. 08/2016 - adm to  [facility] with a ruptured colon s/p right hemicolectomy with ileostomy formation for ischemic and necrotic colon.    Lung nodule    a. followed by pulmonology   Mild CAD    a. minor nonobstructive CAD 08/22/16.   Nephrolithiasis    hx   NICM (nonischemic cardiomyopathy) (HCC)    Orthostatic hypotension    Osteoarthritis    PAF (paroxysmal atrial fibrillation) ([place])    a. diagnosed 07/2016 s/p DCCV 08/01/16 with recurrence, managed with rate control for now.    Pneumonia 2013   PVC's (premature ventricular contractions)    Shingles since Nov 18, 2013   on right arm from shoulder to wrist   Status post dilation of esophageal narrowing    Stroke [facility]) 2001   STROKES, TIA'S   Syncope    a. 08/2016 felt due to orthostatic hypotension.   TIA (transient ischemic attack) last 2001   anticoagulation therapy on coumadin    Past Surgical History:  Procedure Laterality Date   ANTERIOR LATERAL LUMBAR FUSION 4 LEVELS Right 07/03/2015   Procedure: Right Lumbar one-two, Lumbar two-three, Lumbar three-four, Lumbar four-five Anterior lateral lumbar interbody fusion;  Surgeon: Erline Levine, MD;  Location: [place] [facility]URO ORS;  Service: Neurosurgery;  Laterality: Right;  Right L1-2 L2-3 L3-4 L4-5 Anterior lateral lumbar interbody fusion   BALLOON DILATION N/A 03/03/2014   Procedure: BALLOON DILATION;  Surgeon: Inda Castle, MD;  [facility];  Service: Endoscopy;  Laterality: N/A;   BLADDER SUSPENSION     BREAST BIOPSY Right 07/03/2006   CARDIAC CATHETERIZATION N/A 08/22/2016   Procedure: Left Heart Cath and Coronary Angiography;  Surgeon: Peter M Martinique, MD;  Location: [place] CV LAB;  Service: Cardiovascular;  Laterality: N/A;   CARDIOVERSION N/A 08/01/2016   Procedure: CARDIOVERSION;  Surgeon: Jerline Pain, MD;  Location: [place];  Service: Cardiovascular;  Laterality: N/A;   CARDIOVERSION N/A 10/11/2016   Procedure:  CARDIOVERSION;  Surgeon: Sueanne Margarita, MD;  [facility];  Service: Cardiovascular;  Laterality: N/A;   COLONOSCOPY     ESOPHAGOGASTRODUODENOSCOPY N/A 03/03/2014   Procedure: ESOPHAGOGASTRODUODENOSCOPY (EGD);  Surgeon: Inda Castle, MD;  Location[facility];  Service: Endoscopy;  Laterality: N/A;   EXCISION OF BASAL CELL CA  2011   FINGER SURGERY     GLUTEUS MINIMUS REPAIR Right 03/15/2016   Procedure: RIGHT HIP GLUTEUS MEDIUS TENDON REPAIR;  Surgeon: Paralee Cancel, MD;  [facility];  Service: Orthopedics;  Laterality: Right;   ileostomy reversal     LUMBAR PERCUTANEOUS PEDICLE SCREW 4 LEVEL Right 07/03/2015   Procedure: Lumbar one-Sacral one Bilateral percutaneous pedicle screws;  Surgeon: Erline Levine, MD;  Location: [place] NEURO ORS;  Service: Neurosurgery;  Laterality: Right;  L1-S1 Bilateral percutaneous pedicle screws   RECTOCELE REPAIR  2008   A&P REPAIR with vaginal repair-DR. MCDIARMID   THUMB SURGERY Right 10-15 yrs ago   UPPER GASTROINTESTINAL ENDOSCOPY     VAGINAL HYSTERECTOMY  1983   NO BSO-DR. [place]      Social History [name]  reports that she quit smoking about 25 years ago. Her smoking use included cigarettes. She started smoking about 67 years ago. She has a 126.00 pack-year smoking history. She has never used smokeless tobacco. She reports that she does not drink alcohol and does not use drugs.  family history includes Breast cancer in her maternal aunt, maternal aunt, maternal aunt, and mother; COPD in her mother; Emphysema in her mother; Uterine cancer in her mother.  Allergies  Allergen Reactions   Clarithromycin Other (See Comments)    REACTION: possible rash but could have been legionarres dz with rash   Lipitor [Atorvastatin] Other (See Comments)    MUSCLE SPASMS   Simvastatin Other (See Comments)    Muscle and joint pain   Cardizem [Diltiazem Hcl] Hives   Contrast Media [Iodinated Diagnostic Agents]     FYI  - during admission at [facility] 08/2016 patient developed hives, question related to cardizem or contrast dye - not clear   Crestor [Rosuvastatin Calcium]     fagitue and joint pain, "NO STATINS"   Rosuvastatin Calcium Other (See Comments)    fagitue and joint pain, "NO STATINS"       PHYSICAL EXAMINATION: Vital signs: BP 138/60   Pulse 69   Ht 5\' 4"  (1.626 m)   Wt 129 lb (58.5 kg)   SpO2 93%   BMI 22.14 kg/m   Constitutional: generally well-appearing, no acute distress Psychiatric: alert and oriented x3, cooperative Eyes: extraocular movements intact, anicteric, conjunctiva pink Mouth: oral pharynx moist, no lesions Neck: supple no lymphadenopathy Cardiovascular: heart regular rate and rhythm Lungs: clear to auscultation bilaterally Abdomen: soft, nontender, nondistended, no obvious ascites, no peritoneal signs, normal bowel sounds, no organomegaly Rectal: External hemorrhoid tags.  No internal mass.  Brown stool.  Tender anterior fissure. Extremities: no clubbing, cyanosis, or lower extremity  edema bilaterally Skin: no lesions on visible extremities Neuro: No focal deficits.  Cranial nerves intact  ASSESSMENT:  1.  Anal fissure 2.  Transient diarrhea related to antibiotics. 3.  Problems with diarrhea after colostomy reversal for partial bowel obstruction.  Managed with Colestid and Imodium.  Baseline status currently. 4.  Transient right lower quadrant pain improved with defecation.  Nonspecific 5.  Colonoscopy 2014 with diverticulosis 6.  GERD complicated by peptic stricture.  Last EGD with dilation December 2017.   PLAN:  1.  Metamucil 2 tablespoons daily 2.  Diltiazem 2% ointment 5 times daily.  I instructed her the proper way to apply the ointment to the anal sphincter 3.  Sitz bath's daily 4.  Continue Colestid and Imodium as needed 5.  Reflux precautions 6.  Continue PPI 7.  Routine office follow-up 6 months.  Sooner if needed A total time of 30 minutes  was spent preparing to see the patient, reviewing test, obtaining history, performing physical exam, counseling the patient and her husband, ordering medications, and documenting clinical information in the health record

## 2021-09-30 DIAGNOSIS — L820 Inflamed seborrheic keratosis: Secondary | ICD-10-CM | POA: Diagnosis not present

## 2021-10-05 DIAGNOSIS — Z20822 Contact with and (suspected) exposure to covid-19: Secondary | ICD-10-CM | POA: Diagnosis not present

## 2021-10-06 ENCOUNTER — Ambulatory Visit
Admission: RE | Admit: 2021-10-06 | Discharge: 2021-10-06 | Disposition: A | Discharge status: Home / Self Care | Payer: Medicare Other | Source: Ambulatory Visit | Attending: Pulmonary Disease | Admitting: Pulmonary Disease

## 2021-10-06 DIAGNOSIS — I700 Atherosclerosis of aorta: Secondary | ICD-10-CM | POA: Diagnosis not present

## 2021-10-06 DIAGNOSIS — R911 Solitary pulmonary nodule: Secondary | ICD-10-CM

## 2021-10-06 DIAGNOSIS — R918 Other nonspecific abnormal finding of lung field: Secondary | ICD-10-CM

## 2021-10-06 DIAGNOSIS — J439 Emphysema, unspecified: Secondary | ICD-10-CM | POA: Diagnosis not present

## 2021-10-15 NOTE — Progress Notes (Signed)
Patient needs repeat CT scan in 3 to 6 months noncontrasted CT.  For follow-up of multiple pulmonary nodules.  Please schedule appointment to review results of repeat CT once completed with me or APP  Thanks,  BLI  Garner Nash, DO [facility] 10/15/2021 10:33 AM

## 2021-10-16 NOTE — Progress Notes (Signed)
Cardiology Office Note Date:  10/18/2021  Patient ID:  Lauren, Ray 1941/03/06, MRN [id number] PCP:  Biagio Borg, MD  Electrophysiologist: Dr. Curt Bears    Chief Complaint:  over due 6 mo  History of Present Illness: [name] is a 80 y.o. female with history of CVA, Afib, COPD, CKD (III), HTN, HLD, hx (2017), ischemic colon resulting in hemicolectomy/ileostomy, NICM, PVCs  She comes in today to be seen for Dr. Curt Bears, last seen by him march 2022, she was doing well, seemed to be maintaining SR, appeared euvolemic, no changes were made.    TODAY She is accompanied by her husband today She does not think she has had any AFib, says she was quite symptomatic with it and would know No cardiac awareness of any kind, no CP No dizziness, near syncope or syncope No SOB  She has noted some blood tinged urine, and seemed to have some sediment in it when she gave a sample, sees urology next week. No other bleeding or signs of bleeding   Afib hx Dx 2017 No AAD that I see to date  Past Medical History:  Diagnosis Date   Allergic rhinitis    Anxiety    Atrial septal aneurysm    Basal cell carcinoma    Bradycardia    Cataract    bil cataracts removed   Chronic combined systolic and diastolic CHF (congestive heart failure) (HCC)    CKD (chronic kidney disease), stage III (HCC)    Clostridium difficile infection    COPD (chronic obstructive pulmonary disease) ([place])    Depression 11/29/2013   DI (detrusor instability)    Esophageal stricture    GERD (gastroesophageal reflux disease)    Hemorrhoids    Hiatal hernia    History of kidney stones    HLD (hyperlipidemia)    HTN (hypertension)    Ischemic colon ([place])    a. 08/2016 - adm to  [facility] with a ruptured colon s/p right hemicolectomy with ileostomy formation for ischemic and necrotic colon.    Lung nodule    a. followed by pulmonology   Mild CAD    a. minor nonobstructive CAD 08/22/16.    Nephrolithiasis    hx   NICM (nonischemic cardiomyopathy) (HCC)    Orthostatic hypotension    Osteoarthritis    PAF (paroxysmal atrial fibrillation) ([place])    a. diagnosed 07/2016 s/p DCCV 08/01/16 with recurrence, managed with rate control for now.    Pneumonia 2013   PVC's (premature ventricular contractions)    Shingles since Nov 18, 2013   on right arm from shoulder to wrist   Status post dilation of esophageal narrowing    Stroke [facility]) 2001   STROKES, TIA'S   Syncope    a. 08/2016 felt due to orthostatic hypotension.   TIA (transient ischemic attack) last 2001   anticoagulation therapy on coumadin    Past Surgical History:  Procedure Laterality Date   ANTERIOR LATERAL LUMBAR FUSION 4 LEVELS Right 07/03/2015   Procedure: Right Lumbar one-two, Lumbar two-three, Lumbar three-four, Lumbar four-five Anterior lateral lumbar interbody fusion;  Surgeon: Erline Levine, MD;  Location: [place] [facility] OR[facility];  Service: Neurosurgery;  Laterality: Right;  Right L1-2 L2-3 L3-4 L4-5 Anterior lateral lumbar interbody fusion   BALLOON DILATION N/A 03/03/2014   Procedure: BALLOON DILATION;  Surgeon: Inda Castle, MD;  [facility];  Service: Endoscopy;  Laterality: N/A;   BLADDER SUSPENSION     BREAST BIOPSY Right 07/03/2006  CARDIAC CATHETERIZATION N/A 08/22/2016   Procedure: Left Heart Cath and Coronary Angiography;  Surgeon: Peter M Martinique, MD;  Location: [place] CV LAB;  Service: Cardiovascular;  Laterality: N/A;   CARDIOVERSION N/A 08/01/2016   Procedure: CARDIOVERSION;  Surgeon: Jerline Pain, MD;  Location: [place];  Service: Cardiovascular;  Laterality: N/A;   CARDIOVERSION N/A 10/11/2016   Procedure: CARDIOVERSION;  Surgeon: Sueanne Margarita, MD;  [facility];  Service: Cardiovascular;  Laterality: N/A;   COLONOSCOPY     ESOPHAGOGASTRODUODENOSCOPY N/A 03/03/2014   Procedure: ESOPHAGOGASTRODUODENOSCOPY (EGD);  Surgeon: Inda Castle, MD;  Location[facility];   Service: Endoscopy;  Laterality: N/A;   EXCISION OF BASAL CELL CA  2011   FINGER SURGERY     GLUTEUS MINIMUS REPAIR Right 03/15/2016   Procedure: RIGHT HIP GLUTEUS MEDIUS TENDON REPAIR;  Surgeon: Paralee Cancel, MD;  [facility];  Service: Orthopedics;  Laterality: Right;   ileostomy reversal     LUMBAR PERCUTANEOUS PEDICLE SCREW 4 LEVEL Right 07/03/2015   Procedure: Lumbar one-Sacral one Bilateral percutaneous pedicle screws;  Surgeon: Erline Levine, MD;  Location: [employer] [facility];  Service: Neurosurgery;  Laterality: Right;  L1-S1 Bilateral percutaneous pedicle screws   RECTOCELE REPAIR  2008   A&P REPAIR with vaginal repair-DR. MCDIARMID   THUMB SURGERY Right 10-15 yrs ago   UPPER GASTROINTESTINAL ENDOSCOPY     VAGINAL HYSTERECTOMY  1983   NO BSO-DR. MABRY   VARICOSE VEIN SURGERY      Current Outpatient Medications  Medication Sig Dispense Refill   albuterol (VENTOLIN HFA) 108 (90 Base) MCG/ACT inhaler Inhale 1-2 puffs into the lungs every 6 (six) hours as needed for wheezing or shortness of breath. 18 g 3   apixaban (ELIQUIS) 5 MG TABS tablet Take 1 tablet (5 mg total) by mouth 2 (two) times daily. 180 tablet 3   azelastine (ASTELIN) 0.1 % nasal spray Place 2 sprays into the nose daily. Use in each nostril as directed 90 mL 0   benzonatate (TESSALON) 200 MG capsule Take 1 capsule (200 mg total) by mouth 3 (three) times daily as needed for cough. 30 capsule 1   buPROPion (WELLBUTRIN XL) 300 MG 24 hr tablet Take 1 tablet (300 mg total) by mouth daily. 90 tablet 3   Calcium Carbonate-Vitamin D (CALCIUM + D PO) Take 1 tablet by mouth 2 (two) times daily.     cyclobenzaprine (FLEXERIL) 5 MG tablet Take 1 tablet (5 mg total) by mouth 3 (three) times daily as needed for muscle spasms. 270 tablet 1   diltiazem 2 % GEL Apply 1 application topically 5 (five) times daily. 30 g 1   fluticasone (FLONASE) 50 MCG/ACT nasal spray Place 1 spray into both nostrils 2 (two) times daily. 48 g 1    Fluticasone-Umeclidin-Vilant (TRELEGY ELLIPTA) 100-62.5-25 MCG/INH AEPB Inhale 1 puff into the lungs daily. I inhalation once daily 180 each 1   furosemide (LASIX) 20 MG tablet Take 1 tablet (20 mg total) by mouth daily as needed (swelling). 90 tablet 1   gabapentin (NEURONTIN) 300 MG capsule 1 tab by mouth in AM, 1 po at midday, then 2 by mouth at bedtime for sleep and pain 360 capsule 1   guaiFENesin (MUCINEX) 600 MG 12 hr tablet Take 1,200 mg by mouth 2 (two) times daily as needed for cough or to loosen phlegm.     hydroxychloroquine (PLAQUENIL) 200 MG tablet Take 400 mg by mouth at bedtime.     ipratropium (ATROVENT) 0.03 %  nasal spray Place 2 sprays into both nostrils 3 (three) times daily as needed for rhinitis. 90 mL 1   levothyroxine (SYNTHROID) 25 MCG tablet Take 1 tablet (25 mcg total) by mouth daily before breakfast. Annual appt due in Nov must see provider for future refills 90 tablet 3   Lidocaine HCl 2 % CREA Apply 1 application topically daily as needed (for shingles flares).     loperamide (IMODIUM) 2 MG capsule Take by mouth.     loratadine (CLARITIN) 10 MG tablet Take 10 mg by mouth daily.     Melatonin 5 MG TABS Take 5 mg by mouth at bedtime.     metoprolol succinate (TOPROL-XL) 100 MG 24 hr tablet Take 1 tablet (100 mg total) by mouth daily. Take with or immediately following a meal. 90 tablet 3   mirabegron ER (MYRBETRIQ) 50 MG TB24 tablet Take 50 mg by mouth daily.     montelukast (SINGULAIR) 10 MG tablet Take 1 tablet (10 mg total) by mouth daily. Annual appt due in Nov must see provider for future refills 90 tablet 1   Multiple Vitamin (MULTIVITAMIN) tablet Take 1 tablet by mouth daily.     NYSTATIN PO Take 5 mLs by mouth as needed.     omeprazole (PRILOSEC) 40 MG capsule Take 1 capsule (40 mg total) by mouth daily. 90 capsule 3   Probiotic Product (PROBIOTIC DAILY PO) Take by mouth in the morning and at bedtime.      traZODone (DESYREL) 50 MG tablet Take 1 tablet (50 mg  total) by mouth at bedtime and may repeat dose one time if needed. 180 tablet 1   colestipol (COLESTID) 1 g tablet Take 2 tablets (2 g total) by mouth 2 (two) times daily. 360 tablet 3   diphenhydrAMINE (BENADRYL) 50 MG tablet Take 1 tablet (50 mg total) by mouth once for 1 dose. Pt to take 50 mg of Benadryl pm 07/13/21 @ 9:20 AM. Please call [phone] with any questions. 1 tablet 0   ezetimibe (ZETIA) 10 MG tablet Take 1 tablet (10 mg total) by mouth daily. 90 tablet 1   predniSONE (DELTASONE) 10 MG tablet 4 tabs for 2 days, then 3 tabs for 2 days, 2 tabs for 2 days, then 1 tab for 2 days, then stop (Patient not taking: Reported on 10/18/2021) 20 tablet 0   No current facility-administered medications for this visit.    Allergies:   Clarithromycin, Lipitor [atorvastatin], Simvastatin, Cardizem [diltiazem hcl], Contrast media [iodinated diagnostic agents], Crestor [rosuvastatin calcium], and Rosuvastatin calcium   Social History:  The patient  reports that she quit smoking about 25 years ago. Her smoking use included cigarettes. She started smoking about 67 years ago. She has a 126.00 pack-year smoking history. She has never used smokeless tobacco. She reports that she does not drink alcohol and does not use drugs.   Family History:  The patient's family history includes Breast cancer in her maternal aunt, maternal aunt, maternal aunt, and mother; COPD in her mother; Emphysema in her mother; Uterine cancer in her mother.  ROS:  Please see the history of present illness.    All other systems are reviewed and otherwise negative.   PHYSICAL EXAM:  VS:  BP 118/74   Pulse 65   Ht 5\' 4"  (1.626 m)   Wt 128 lb (58.1 kg)   SpO2 97%   BMI 21.97 kg/m  BMI: Body mass index is 21.97 kg/m. Well nourished, well developed, in no acute distress HEENT:  normocephalic, atraumatic Neck: no JVD, carotid bruits or masses Cardiac:  RRR; 1-6/1 SM w/diastolic component as well, no rubs, or gallops Lungs:  CTA  b/l, no wheezing, rhonchi or rales Abd: soft, nontender MS: no deformity or atrophy Ext:  no edema Skin: warm and dry, no rash Neuro:  No gross deficits appreciated Psych: euthymic mood, full affect    EKG:  not done today  TTE 04/05/17, Cath 08/22/16, Telemetry 06/14/16  TTE 04/05/2017 - Left ventricle: The cavity size was normal. Wall thickness was   normal. Systolic function was mildly reduced. The estimated   ejection fraction was in the range of 45% to 50%. Diffuse   hypokinesis. Doppler parameters are consistent with abnormal left   ventricular relaxation (grade 1 diastolic dysfunction). Doppler   parameters are consistent with high ventricular filling pressure. - Aortic valve: There was mild stenosis. - Mitral valve: There was moderate regurgitation. - Left atrium: The atrium was mildly dilated. - Pulmonary arteries: Systolic pressure was mildly increased. PA   peak pressure: 40 mm Hg (S).     Cath Prox LAD to Mid LAD lesion, 15 %stenosed. Ost Cx to Prox Cx lesion, 15 %stenosed. Prox RCA to Mid RCA lesion, 20 %stenosed. There is mild left ventricular systolic dysfunction. LV end diastolic pressure is normal. The left ventricular ejection fraction is 45-50% by visual estimate.   1. Minor nonobstructive CAD 2. Mild LV dysfunction with distal inferior HK 3. Measurement of LV pressures limited by frequent PVCs but EDP appears normal.    Telemetry 1. NSR with PVC's, at times in a bigeminal manner 2. Signal drop out as well as noise artifact are present.  3. No sustained atrial or ventricular arrhythmias 4. No pauses    Recent Labs: 05/13/2021: ALT 15; BUN 20; Creatinine, Ser 1.30; Hemoglobin 12.5; Platelets 159.0; Potassium 4.5; Sodium 140; TSH 2.10  05/13/2021: Cholesterol 143; HDL 59.20; LDL Cholesterol 56; Total CHOL/HDL Ratio 2; Triglycerides 141.0; VLDL 28.2   CrCl cannot be calculated (Patient's most recent lab result is older than the maximum 21 days allowed.).   Wt  Readings from Last 3 Encounters:  10/18/21 128 lb (58.1 kg)  09/28/21 129 lb (58.5 kg)  09/16/21 128 lb 3.2 oz (58.2 kg)     Other studies reviewed: Additional studies/records reviewed today include: summarized above  ASSESSMENT AND PLAN:  Paroxysmal Afib CHA2DS2Vasc is 7, on Eliquis I noted AFTER the patient left that her weight has consistently been < 60kg for the last couple months, given her age she should be on 2.5mg  BID I have asked my MA to call her and change her prescription  NICM Chronic CHF (systolic) Last echo LVEF 09-60% Murmur on exam will update her echo  4.   HTN Looks great    Disposition: F/u with 92mo, sooner if needed.  Her PMD will be doing labs next month  Current medicines are reviewed at length with the patient today.  The patient did not have any concerns regarding medicines.  Venetia Night, PA-C 10/18/2021 3:53 PM     [address] [place] [address] [phone] (office)  [phone] (fax)

## 2021-10-18 ENCOUNTER — Ambulatory Visit (INDEPENDENT_AMBULATORY_CARE_PROVIDER_SITE_OTHER): Payer: Medicare Other | Admitting: Physician Assistant

## 2021-10-18 ENCOUNTER — Encounter: Payer: Self-pay | Admitting: Physician Assistant

## 2021-10-18 ENCOUNTER — Telehealth: Payer: Self-pay | Admitting: *Deleted

## 2021-10-18 ENCOUNTER — Other Ambulatory Visit: Payer: Self-pay

## 2021-10-18 VITALS — BP 118/74 | HR 65 | Ht 64.0 in | Wt 128.0 lb

## 2021-10-18 DIAGNOSIS — I428 Other cardiomyopathies: Secondary | ICD-10-CM | POA: Diagnosis not present

## 2021-10-18 DIAGNOSIS — I480 Paroxysmal atrial fibrillation: Secondary | ICD-10-CM | POA: Diagnosis not present

## 2021-10-18 DIAGNOSIS — I10 Essential (primary) hypertension: Secondary | ICD-10-CM

## 2021-10-18 DIAGNOSIS — R011 Cardiac murmur, unspecified: Secondary | ICD-10-CM | POA: Diagnosis not present

## 2021-10-18 MED ORDER — APIXABAN 2.5 MG PO TABS: 2.5000 mg | ORAL_TABLET | Freq: Two times a day (BID) | ORAL | 1 refills | Status: DC

## 2021-10-18 NOTE — Telephone Encounter (Signed)
-----   Message from [facility], [place] sent at 10/18/2021  4:28 PM EST ----- Please reduce her Eliquis to 2.5mg  BID

## 2021-10-18 NOTE — Patient Instructions (Signed)
Medication Instructions:   Your physician recommends that you continue on your current medications as directed. Please refer to the Current Medication list given to you today.   *If you need a refill on your cardiac medications before your next appointment, please call your pharmacy*   Lab Work: [place]   If you have labs (blood work) drawn today and your tests are completely normal, you will receive your results only by: [employer] (if you have MyChart) OR A paper copy in the mail If you have any lab test that is abnormal or we need to change your treatment, we will call you to review the results.   Testing/Procedures: Your physician has requested that you have an echocardiogram. Echocardiography is a painless test that uses sound waves to create images of your heart. It provides your doctor with information about the size and shape of your heart and how well your heart's chambers and valves are working. This procedure takes approximately one hour. There are no restrictions for this procedure.    Follow-Up: At [facility], you and your health needs are our priority.  As part of our continuing mission to provide you with exceptional heart care, we have created designated Provider Care Teams.  These Care Teams include your primary Cardiologist (physician) and Advanced Practice Providers (APPs -  Physician Assistants and Nurse Practitioners) who all work together to provide you with the care you need, when you need it.  We recommend signing up for the patient portal called "MyChart".  Sign up information is provided on this After Visit Summary.  MyChart is used to connect with patients for Virtual Visits (Telemedicine).  Patients are able to view lab/test results, encounter notes, upcoming appointments, etc.  Non-urgent messages can be sent to your provider as well.   To learn more about what you can do with MyChart, go to [email or web address].    Your next appointment:    6 month(s)  The format for your next appointment:   In Person  Provider:   You may see Will Meredith Leeds, MD or one of the following Advanced Practice Providers on your designated Care Team:   Tommye Standard, [place] Legrand Como "Jonni Sanger" Chalmers Cater, [place]    Other Instructions

## 2021-10-18 NOTE — Telephone Encounter (Signed)
Lvm of medication changes Eliquis 2.5 mg twice a day due to age and weight. Patient was told to call back to confirm messaged received or leave Mychart messge

## 2021-10-19 DIAGNOSIS — R310 Gross hematuria: Secondary | ICD-10-CM | POA: Diagnosis not present

## 2021-10-19 DIAGNOSIS — I700 Atherosclerosis of aorta: Secondary | ICD-10-CM | POA: Diagnosis not present

## 2021-10-19 DIAGNOSIS — R311 Benign essential microscopic hematuria: Secondary | ICD-10-CM | POA: Diagnosis not present

## 2021-10-19 DIAGNOSIS — K449 Diaphragmatic hernia without obstruction or gangrene: Secondary | ICD-10-CM | POA: Diagnosis not present

## 2021-10-19 DIAGNOSIS — N200 Calculus of kidney: Secondary | ICD-10-CM | POA: Diagnosis not present

## 2021-10-19 MED ORDER — APIXABAN 2.5 MG PO TABS: 2.5000 mg | ORAL_TABLET | Freq: Two times a day (BID) | ORAL | 1 refills | Status: DC

## 2021-10-19 NOTE — Addendum Note (Signed)
Addended by: Claude Manges on: 10/19/2021 02:17 PM   Modules accepted: Orders

## 2021-10-19 NOTE — Telephone Encounter (Signed)
Spoke

## 2021-10-19 NOTE — Telephone Encounter (Signed)
Spoke with patient and aware of medication changes and reasoning. Patient Rx sent in to appropriate pharmacy to better be cost efficient.

## 2021-10-20 ENCOUNTER — Encounter: Payer: Self-pay | Admitting: *Deleted

## 2021-10-20 ENCOUNTER — Other Ambulatory Visit: Payer: Self-pay | Admitting: *Deleted

## 2021-10-20 DIAGNOSIS — R911 Solitary pulmonary nodule: Secondary | ICD-10-CM

## 2021-10-22 ENCOUNTER — Encounter: Payer: Self-pay | Admitting: Internal Medicine

## 2021-10-25 ENCOUNTER — Other Ambulatory Visit: Payer: Self-pay | Admitting: Urology

## 2021-10-25 ENCOUNTER — Telehealth: Payer: Self-pay | Admitting: Cardiology

## 2021-10-25 NOTE — Progress Notes (Addendum)
Anesthesia Review:  PCP: DR Cathlean Cower LOV 05/20/21  Cardiologist : DR will Camnitz - [place] 10/18/21- Roderic Palau  Pulmonology- DR Icard- [place] 09/16/21  Chest x-ray : 08/13/21 CT Chest- 10/07/21  EKG :02/12/21  Echo :2018  Stress test: Cardiac Cath : 2017  Activity level: cannot do a flight of stairs without difficulty  Sleep Study/ CPAP : none  Fasting Blood Sugar :      / Checks Blood Sugar -- times a day:   Blood Thinner/ Instructions /Last Dose: ASA / Instructions/ Last Dose :   Eliquis - stop 2 days prior per pt  No covid test- amblatory surgery .

## 2021-10-25 NOTE — Progress Notes (Signed)
[name]  10/25/2021   Your procedure is scheduled on:  11/04/2021   Report to [facility] Main  Entrance   Report to admitting at    [phone]     Call this number if you have problems the morning of surgery [phone]    Remember: Do not eat food , candy gum or mints :After Midnight. You may have clear liquids from midnight until __  0750am    CLEAR LIQUID DIET   Foods Allowed                                                                       Coffee and tea, regular and decaf                              Plain Jell-O any favor except red or purple                                            Fruit ices (not with fruit pulp)                                      Iced Popsicles                                     Carbonated beverages, regular and diet                                    Cranberry, grape and apple juices Sports drinks like Gatorade Lightly seasoned clear broth or consume(fat free) Sugar   _____________________________________________________________________    BRUSH YOUR TEETH MORNING OF SURGERY AND RINSE YOUR MOUTH OUT, NO CHEWING GUM CANDY OR MINTS.     Take these medicines the morning of surgery with A SIP OF WATER:  Inhalers as usual and bring, nasal spray, wellbutrin, gabapentin, synthroid, omeprazole, claritin, metoprolol, myrbetriq  DO NOT TAKE ANY DIABETIC MEDICATIONS DAY OF YOUR SURGERY                               You may not have any metal on your body including hair pins and              piercings  Do not wear jewelry, make-up, lotions, powders or perfumes, deodorant             Do not wear nail polish on your fingernails.  Do not shave  48 hours prior to surgery.              Men may shave face and neck.   Do not bring valuables to the hospital. [place]  FOR VALUABLES.  Contacts, dentures or bridgework may not be worn into surgery.  Leave suitcase in the car. After surgery it  may be brought to your room.     Patients discharged the day of surgery will not be allowed to drive home. IF YOU ARE HAVING SURGERY AND GOING HOME THE SAME DAY, YOU MUST HAVE AN ADULT TO DRIVE YOU HOME AND BE WITH YOU FOR 24 HOURS. YOU MAY GO HOME BY TAXI OR UBER OR ORTHERWISE, BUT AN ADULT MUST ACCOMPANY YOU HOME AND STAY WITH YOU FOR 24 HOURS.  Name and phone number of your driver:  Special Instructions: N/A              Please read over the following fact sheets you were given: _____________________________________________________________________  [facility] - Preparing for Surgery Before surgery, you can play an important role.  Because skin is not sterile, your skin needs to be as free of germs as possible.  You can reduce the number of germs on your skin by washing with CHG (chlorahexidine gluconate) soap before surgery.  CHG is an antiseptic cleaner which kills germs and bonds with the skin to continue killing germs even after washing. Please DO NOT use if you have an allergy to CHG or antibacterial soaps.  If your skin becomes reddened/irritated stop using the CHG and inform your nurse when you arrive at Short Stay. Do not shave (including legs and underarms) for at least 48 hours prior to the first CHG shower.  You may shave your face/neck. Please follow these instructions carefully:  1.  Shower with CHG Soap the night before surgery and the  morning of Surgery.  2.  If you choose to wash your hair, wash your hair first as usual with your  normal  shampoo.  3.  After you shampoo, rinse your hair and body thoroughly to remove the  shampoo.                           4.  Use CHG as you would any other liquid soap.  You can apply chg directly  to the skin and wash                       Gently with a scrungie or clean washcloth.  5.  Apply the CHG Soap to your body ONLY FROM THE NECK DOWN.   Do not use on face/ open                           Wound or open sores. Avoid contact with eyes,  ears mouth and genitals (private parts).                       Wash face,  Genitals (private parts) with your normal soap.             6.  Wash thoroughly, paying special attention to the area where your surgery  will be performed.  7.  Thoroughly rinse your body with warm water from the neck down.  8.  DO NOT shower/wash with your normal soap after using and rinsing off  the CHG Soap.                9.  Pat yourself dry with a clean towel.            10.  Wear clean  pajamas.            11.  Place clean sheets on your bed the night of your first shower and do not  sleep with pets. Day of Surgery : Do not apply any lotions/deodorants the morning of surgery.  Please wear clean clothes to the hospital/surgery center.  FAILURE TO FOLLOW THESE INSTRUCTIONS MAY RESULT IN THE CANCELLATION OF YOUR SURGERY PATIENT SIGNATURE_________________________________  NURSE SIGNATURE__________________________________  ________________________________________________________________________

## 2021-10-25 NOTE — Telephone Encounter (Signed)
    Patient Name: [name]  DOB: 1941/08/01 MRN: [id number]  Primary Cardiologist: Constance Haw, MD  Chart reviewed as part of pre-operative protocol coverage.   The patient was recently seen by Tommye Standard, PAC. Patient was doing well but has pending echo on 12/8 for murmur. Surgery on 12/1.  Renee, can patient be cleared or wait until echo resulted?  Pharmacy to review anticoagulation.   Please forward your response to P CV DIV PREOP.   Thank you    Leanor Kail, PA 10/25/2021, 3:12 PM

## 2021-10-25 NOTE — Telephone Encounter (Signed)
   [facility] Pre-operative Risk Assessment    Request for surgical clearance:  What type of surgery is being performed? ureteroscopy   When is this surgery scheduled? 11/04/21   What type of clearance is required (medical clearance vs. Pharmacy clearance to hold med vs. Both)? both  Are there any medications that need to be held prior to surgery and how long? 2 days  apixaban (ELIQUIS) 2.5 MG TABS tablet  Practice name and name of physician performing surgery? Dr. Jearl Klinefelter   What is your office phone number [phone]    7.   What is your office fax number [phone]   8.   Anesthesia type (None, local, MAC, general) ? general   Milbert Coulter 10/25/2021, 3:03 PM  _________________________________________________________________   (provider comments below)

## 2021-10-25 NOTE — Telephone Encounter (Signed)
Patient with diagnosis of afib on Eliquis for anticoagulation.    Procedure: ureteroscopy Date of procedure: 11/04/21  CHA2DS2-VASc Score = 8  This indicates a 10.8% annual risk of stroke. The patient's score is based upon: CHF History: 1 HTN History: 1 Diabetes History: 0 Stroke History: 2 Vascular Disease History: 1 Age Score: 2 Gender Score: 1   CrCl 81mL/min Platelet count 159K  Per office protocol, patient can hold Eliquis for 2 days prior to procedure (high CV risk but lower CrCl). Resume as soon as safely possible after due to elevated CV risk.

## 2021-10-26 ENCOUNTER — Encounter (HOSPITAL_COMMUNITY)
Admission: RE | Admit: 2021-10-26 | Discharge: 2021-10-26 | Disposition: A | Discharge status: Home / Self Care | Payer: Medicare Other | Source: Ambulatory Visit | Attending: Urology | Admitting: Urology

## 2021-10-26 ENCOUNTER — Encounter (HOSPITAL_COMMUNITY): Payer: Self-pay

## 2021-10-26 ENCOUNTER — Other Ambulatory Visit: Payer: Self-pay

## 2021-10-26 DIAGNOSIS — I480 Paroxysmal atrial fibrillation: Secondary | ICD-10-CM | POA: Diagnosis not present

## 2021-10-26 DIAGNOSIS — I5042 Chronic combined systolic (congestive) and diastolic (congestive) heart failure: Secondary | ICD-10-CM | POA: Insufficient documentation

## 2021-10-26 DIAGNOSIS — Z01812 Encounter for preprocedural laboratory examination: Secondary | ICD-10-CM | POA: Diagnosis not present

## 2021-10-26 DIAGNOSIS — J449 Chronic obstructive pulmonary disease, unspecified: Secondary | ICD-10-CM | POA: Diagnosis not present

## 2021-10-26 DIAGNOSIS — I130 Hypertensive heart and chronic kidney disease with heart failure and stage 1 through stage 4 chronic kidney disease, or unspecified chronic kidney disease: Secondary | ICD-10-CM | POA: Diagnosis not present

## 2021-10-26 DIAGNOSIS — Z7901 Long term (current) use of anticoagulants: Secondary | ICD-10-CM | POA: Insufficient documentation

## 2021-10-26 DIAGNOSIS — I428 Other cardiomyopathies: Secondary | ICD-10-CM | POA: Diagnosis not present

## 2021-10-26 DIAGNOSIS — I69354 Hemiplegia and hemiparesis following cerebral infarction affecting left non-dominant side: Secondary | ICD-10-CM | POA: Diagnosis not present

## 2021-10-26 DIAGNOSIS — N200 Calculus of kidney: Secondary | ICD-10-CM | POA: Insufficient documentation

## 2021-10-26 DIAGNOSIS — N183 Chronic kidney disease, stage 3 unspecified: Secondary | ICD-10-CM | POA: Diagnosis not present

## 2021-10-26 DIAGNOSIS — I10 Essential (primary) hypertension: Secondary | ICD-10-CM

## 2021-10-26 LAB — BASIC METABOLIC PANEL
Anion gap: 6 (ref 5–15)
BUN: 18 mg/dL (ref 8–23)
CO2: 28 mmol/L (ref 22–32)
Calcium: 9 mg/dL (ref 8.9–10.3)
Chloride: 105 mmol/L (ref 98–111)
Creatinine, Ser: 1.17 mg/dL — ABNORMAL HIGH (ref 0.44–1.00)
GFR, Estimated: 47 mL/min — ABNORMAL LOW (ref >60)
Glucose, Bld: 95 mg/dL (ref 70–99)
Potassium: 4.2 mmol/L (ref 3.5–5.1)
Sodium: 139 mmol/L (ref 135–145)

## 2021-10-26 LAB — CBC
HCT: 37.9 % (ref 36.0–46.0)
Hemoglobin: 12 g/dL (ref 12.0–15.0)
MCH: 26.7 pg (ref 26.0–34.0)
MCHC: 31.7 g/dL (ref 30.0–36.0)
MCV: 84.4 fL (ref 80.0–100.0)
Platelets: 164 10*3/uL (ref 150–400)
RBC: 4.49 MIL/uL (ref 3.87–5.11)
RDW: 13.6 % (ref 11.5–15.5)
WBC: 4.9 10*3/uL (ref 4.0–10.5)
nRBC: 0 % (ref 0.0–0.2)

## 2021-10-26 MED ORDER — EZETIMIBE 10 MG PO TABS: 10.0000 mg | ORAL_TABLET | Freq: Every day | ORAL | 0 refills | Status: AC

## 2021-10-26 MED ORDER — BUPROPION HCL ER (XL) 300 MG PO TB24: 300.0000 mg | ORAL_TABLET | Freq: Every day | ORAL | 0 refills | Status: AC

## 2021-10-27 NOTE — Progress Notes (Signed)
Anesthesia Chart Review   Case: [id number] Date/Time: 11/04/21 1035   Procedure: CYSTOSCOPY WITH RETROGRADE PYELOGRAM, URETEROSCOPY  HOLMIUM LASER AND STENT PLACEMENT (Left)   Anesthesia type: General   Pre-op diagnosis: LEFT RENAL STONE   Location: [place] [facility]   Surgeons: Ardis Hughs, MD       DISCUSSION:80 y.o. former smoker with h/o HTN, COPD, Stroke with residual left leg weakness, PAF (on Eliquis), NICM, CKD, left renal stone scheduled for above procedure 11/04/21 with Dr. Louis Meckel.   Advised by pharmacy to hold Eliquis 2 days prior to procedure.   Pt last seen by cardiology 10/18/2021. Stable at this visit. Discussed with cardiology PA.  Ok to proceed.   Anticipate pt can proceed with planned procedure barring acute status change.   VS: BP 132/84   Pulse 65   Temp 36.9 C (Oral)   Resp 16   Ht 5\' 4"  (1.626 m)   Wt 56.7 kg   SpO2 97%   BMI 21.46 kg/m   PROVIDERS: Biagio Borg, MD is PCP    Allegra Lai, MD is Cardiologist  LABS: Labs reviewed: Acceptable for surgery. (all labs ordered are listed, but only abnormal results are displayed)  Labs Reviewed  BASIC METABOLIC PANEL - Abnormal; Notable for the following components:      Result Value   Creatinine, Ser 1.17 (*)    GFR, Estimated 47 (*)    All other components within normal limits  CBC     IMAGES:   EKG: 02/12/21 Rate 75 bpm    CV: Echo 04/05/2017  - Left ventricle: The cavity size was normal. Wall thickness was    normal. Systolic function was mildly reduced. The estimated    ejection fraction was in the range of 45% to 50%. Diffuse    hypokinesis. Doppler parameters are consistent with abnormal left    ventricular relaxation (grade 1 diastolic dysfunction). Doppler    parameters are consistent with high ventricular filling pressure.  - Aortic valve: There was mild stenosis.  - Mitral valve: There was moderate regurgitation.  - Left atrium: The atrium was mildly  dilated.  - Pulmonary arteries: Systolic pressure was mildly increased. PA    peak pressure: 40 mm Hg (S).   Cardiac Cath 08/22/2016 Prox LAD to Mid LAD lesion, 15 %stenosed. Ost Cx to Prox Cx lesion, 15 %stenosed. Prox RCA to Mid RCA lesion, 20 %stenosed. There is mild left ventricular systolic dysfunction. LV end diastolic pressure is normal. The left ventricular ejection fraction is 45-50% by visual estimate.   1. Minor nonobstructive CAD 2. Mild LV dysfunction with distal inferior HK 3. Measurement of LV pressures limited by frequent PVCs but EDP appears normal.    Past Medical History:  Diagnosis Date   Allergic rhinitis    Anxiety    Atrial septal aneurysm    Basal cell carcinoma    Bradycardia    Cataract    bil cataracts removed   Chronic combined systolic and diastolic CHF (congestive heart failure) (HCC)    CKD (chronic kidney disease), stage III (HCC)    Clostridium difficile infection    COPD (chronic obstructive pulmonary disease) ([place])    Depression 11/29/2013   DI (detrusor instability)    Esophageal stricture    Hemorrhoids    Hiatal hernia    History of kidney stones    HLD (hyperlipidemia)    HTN (hypertension)    Ischemic colon ([place])    a. 08/2016 -  adm to  [facility] with a ruptured colon s/p right hemicolectomy with ileostomy formation for ischemic and necrotic colon.    Lung nodule    a. followed by pulmonology   Mild CAD    a. minor nonobstructive CAD 08/22/16.   Nephrolithiasis    hx   NICM (nonischemic cardiomyopathy) (HCC)    Orthostatic hypotension    Osteoarthritis    PAF (paroxysmal atrial fibrillation) ([place])    a. diagnosed 07/2016 s/p DCCV 08/01/16 with recurrence, managed with rate control for now.    Pneumonia 2013   PVC's (premature ventricular contractions)    Shingles since Nov 18, 2013   on right arm from shoulder to wrist   Status post dilation of esophageal narrowing    Stroke [facility]) 2001   stroke affected left leg    Syncope    a. 08/2016 felt due to orthostatic hypotension.   TIA (transient ischemic attack) last 2001   anticoagulation therapy on coumadin    Past Surgical History:  Procedure Laterality Date   ANTERIOR LATERAL LUMBAR FUSION 4 LEVELS Right 07/03/2015   Procedure: Right Lumbar one-two, Lumbar two-three, Lumbar three-four, Lumbar four-five Anterior lateral lumbar interbody fusion;  Surgeon: Erline Levine, MD;  Location: [place] NEURO ORS;  Service: Neurosurgery;  Laterality: Right;  Right L1-2 L2-3 L3-4 L4-5 Anterior lateral lumbar interbody fusion   BALLOON DILATION N/A 03/03/2014   Procedure: BALLOON DILATION;  Surgeon: Inda Castle, MD;  Location: [facility] ENDOS[facility];  Service: Endoscopy;  Laterality: N/A;   BLADDER SUSPENSION     BREAST BIOPSY Right 07/03/2006   CARDIAC CATHETERIZATION N/A 08/22/2016   Procedure: Left Heart Cath and Coronary Angiography;  Surgeon: Peter M Martinique, MD;  Location: [place] CV LAB;  Service: Cardiovascular;  Laterality: N/A;   CARDIOVERSION N/A 08/01/2016   Procedure: CARDIOVERSION;  Surgeon: Jerline Pain, MD;  Location: [place];  Service: Cardiovascular;  Laterality: N/A;   CARDIOVERSION N/A 10/11/2016   Procedure: CARDIOVERSION;  Surgeon: Sueanne Margarita, MD;  Location: [facility] ENDOS[facility];  Service: Cardiovascular;  Laterality: N/A;   COLONOSCOPY     ESOPHAGOGASTRODUODENOSCOPY N/A 03/03/2014   Procedure: ESOPHAGOGASTRODUODENOSCOPY (EGD);  Surgeon: Inda Castle, MD;  Location: [facility];  Service: Endoscopy;  Laterality: N/A;   EXCISION OF BASAL CELL CA  2011   FINGER SURGERY     GLUTEUS MINIMUS REPAIR Right 03/15/2016   Procedure: RIGHT HIP GLUTEUS MEDIUS TENDON REPAIR;  Surgeon: Paralee Cancel, MD;  Location: [facility];  Service: Orthopedics;  Laterality: Right;   ileostomy reversal     LUMBAR PERCUTANEOUS PEDICLE SCREW 4 LEVEL Right 07/03/2015   Procedure: Lumbar one-Sacral one Bilateral percutaneous pedicle screws;  Surgeon: Erline Levine, MD;  Location: [place]  NEURO ORS;  Service: Neurosurgery;  Laterality: Right;  L1-S1 Bilateral percutaneous pedicle screws   RECTOCELE REPAIR  2008   A&P REPAIR with vaginal repair-DR. MCDIARMID   THUMB SURGERY Right 10-15 yrs ago   UPPER GASTROINTESTINAL ENDOSCOPY     VAGINAL HYSTERECTOMY  1983   NO BSO-DR. MABRY   VARICOSE VEIN SURGERY      MEDICATIONS:  albuterol (VENTOLIN HFA) 108 (90 Base) MCG/ACT inhaler   apixaban (ELIQUIS) 2.5 MG TABS tablet   azelastine (ASTELIN) 0.1 % nasal spray   benzonatate (TESSALON) 200 MG capsule   Biotin 5 MG CAPS   buPROPion (WELLBUTRIN XL) 300 MG 24 hr tablet   Calcium Carbonate-Vitamin D (CALCIUM + D PO)   colestipol (COLESTID) 1 g tablet   cyclobenzaprine (FLEXERIL) 5 MG  tablet   diltiazem 2 % GEL   diphenhydrAMINE (BENADRYL) 50 MG tablet   ezetimibe (ZETIA) 10 MG tablet   fluticasone (FLONASE) 50 MCG/ACT nasal spray   Fluticasone-Umeclidin-Vilant (TRELEGY ELLIPTA) 100-62.5-25 MCG/INH AEPB   furosemide (LASIX) 20 MG tablet   gabapentin (NEURONTIN) 300 MG capsule   guaiFENesin (MUCINEX) 600 MG 12 hr tablet   hydroxychloroquine (PLAQUENIL) 200 MG tablet   ipratropium (ATROVENT) 0.03 % nasal spray   levothyroxine (SYNTHROID) 25 MCG tablet   Lidocaine HCl 2 % CREA   loratadine (CLARITIN) 10 MG tablet   Melatonin 10 MG TABS   metoprolol succinate (TOPROL-XL) 100 MG 24 hr tablet   mirabegron ER (MYRBETRIQ) 50 MG TB24 tablet   montelukast (SINGULAIR) 10 MG tablet   Multiple Vitamin (MULTIVITAMIN) tablet   omeprazole (PRILOSEC) 40 MG capsule   predniSONE (DELTASONE) 10 MG tablet   Probiotic Product (PROBIOTIC DAILY PO)   traZODone (DESYREL) 50 MG tablet   No current facility-administered medications for this encounter.     Konrad Felix Ward, PA-C [facility] Pre-Surgical Testing [phone]

## 2021-10-27 NOTE — Telephone Encounter (Signed)
Hey [name]. Can you please see [name]'s note below? Thanks so much!

## 2021-11-01 NOTE — Telephone Encounter (Signed)
   Name: [name]  DOB: January 03, 1941  MRN: [id number]   Primary Cardiologist: Constance Haw, MD  Chart reviewed as part of pre-operative protocol coverage. Patient was contacted 11/01/2021 in reference to pre-operative risk assessment for pending surgery as outlined below.  [name] was last seen on 10/18/21 by Tommye Standard PAC.  Since that day, [name] has done well. She can complete more than 4.0 METS without angina. She denies dyspnea, LE swelling, and orthopnea.   Per our clinical pharmacist: Per office protocol, patient can hold Eliquis for 2 days prior to procedure (high CV risk but lower CrCl). Resume as soon as safely possible after due to elevated CV risk.  Therefore, based on ACC/AHA guidelines, the patient would be at acceptable risk for the planned procedure without further cardiovascular testing.   The patient was advised that if she develops new symptoms prior to surgery to contact our office to arrange for a follow-up visit, and she verbalized understanding.  I will route this recommendation to the requesting party via Epic fax function and remove from pre-op pool. Please call with questions.  Tami Lin Zaylin Pistilli, PA 11/01/2021, 8:49 AM

## 2021-11-04 ENCOUNTER — Ambulatory Visit (HOSPITAL_COMMUNITY): Payer: Medicare Other | Admitting: Physician Assistant

## 2021-11-04 ENCOUNTER — Encounter (HOSPITAL_COMMUNITY)
Admission: RE | Disposition: A | Discharge status: Home / Self Care | Payer: Self-pay | Source: Ambulatory Visit | Attending: Urology

## 2021-11-04 ENCOUNTER — Ambulatory Visit (HOSPITAL_COMMUNITY): Payer: Medicare Other

## 2021-11-04 ENCOUNTER — Ambulatory Visit (HOSPITAL_COMMUNITY): Payer: Medicare Other | Admitting: Certified Registered Nurse Anesthetist

## 2021-11-04 ENCOUNTER — Encounter (HOSPITAL_COMMUNITY): Payer: Self-pay | Admitting: Urology

## 2021-11-04 ENCOUNTER — Other Ambulatory Visit: Payer: Self-pay

## 2021-11-04 ENCOUNTER — Ambulatory Visit (HOSPITAL_COMMUNITY)
Admission: RE | Admit: 2021-11-04 | Discharge: 2021-11-04 | Disposition: A | Discharge status: Home / Self Care | Payer: Medicare Other | Source: Ambulatory Visit | Attending: Urology | Admitting: Urology

## 2021-11-04 DIAGNOSIS — I130 Hypertensive heart and chronic kidney disease with heart failure and stage 1 through stage 4 chronic kidney disease, or unspecified chronic kidney disease: Secondary | ICD-10-CM | POA: Diagnosis not present

## 2021-11-04 DIAGNOSIS — Z87891 Personal history of nicotine dependence: Secondary | ICD-10-CM | POA: Insufficient documentation

## 2021-11-04 DIAGNOSIS — F418 Other specified anxiety disorders: Secondary | ICD-10-CM | POA: Diagnosis not present

## 2021-11-04 DIAGNOSIS — N200 Calculus of kidney: Secondary | ICD-10-CM

## 2021-11-04 DIAGNOSIS — N189 Chronic kidney disease, unspecified: Secondary | ICD-10-CM | POA: Insufficient documentation

## 2021-11-04 DIAGNOSIS — I4819 Other persistent atrial fibrillation: Secondary | ICD-10-CM | POA: Diagnosis not present

## 2021-11-04 DIAGNOSIS — I509 Heart failure, unspecified: Secondary | ICD-10-CM | POA: Insufficient documentation

## 2021-11-04 DIAGNOSIS — N201 Calculus of ureter: Secondary | ICD-10-CM | POA: Diagnosis not present

## 2021-11-04 DIAGNOSIS — J441 Chronic obstructive pulmonary disease with (acute) exacerbation: Secondary | ICD-10-CM | POA: Diagnosis not present

## 2021-11-04 DIAGNOSIS — Z87442 Personal history of urinary calculi: Secondary | ICD-10-CM

## 2021-11-04 DIAGNOSIS — I10 Essential (primary) hypertension: Secondary | ICD-10-CM

## 2021-11-04 HISTORY — PX: CYSTOSCOPY WITH RETROGRADE PYELOGRAM, URETEROSCOPY AND STENT PLACEMENT: SHX5789

## 2021-11-04 SURGERY — CYSTOURETEROSCOPY, WITH RETROGRADE PYELOGRAM AND STENT INSERTION
Anesthesia: General | Site: Ureter | Laterality: Left

## 2021-11-04 MED ORDER — CEFAZOLIN SODIUM-DEXTROSE 2-4 GM/100ML-% IV SOLN
2.0000 g | INTRAVENOUS | Status: AC
  Administered 2021-11-04: 2 g via INTRAVENOUS
  Filled 2021-11-04: qty 100

## 2021-11-04 MED ORDER — PROPOFOL 10 MG/ML IV BOLUS
INTRAVENOUS | Status: AC
  Filled 2021-11-04: qty 20

## 2021-11-04 MED ORDER — SODIUM CHLORIDE 0.9 % IR SOLN
Status: DC | PRN
  Administered 2021-11-04: 6000 mL via INTRAVESICAL

## 2021-11-04 MED ORDER — DEXAMETHASONE SODIUM PHOSPHATE 10 MG/ML IJ SOLN
INTRAMUSCULAR | Status: AC
  Filled 2021-11-04: qty 1

## 2021-11-04 MED ORDER — LACTATED RINGERS IV SOLN: INTRAVENOUS | Status: DC

## 2021-11-04 MED ORDER — STERILE WATER FOR IRRIGATION IR SOLN
Status: DC | PRN
  Administered 2021-11-04: 500 mL

## 2021-11-04 MED ORDER — PHENAZOPYRIDINE HCL 200 MG PO TABS: 200.0000 mg | ORAL_TABLET | Freq: Three times a day (TID) | ORAL | 0 refills | Status: DC | PRN

## 2021-11-04 MED ORDER — TRAMADOL HCL 50 MG PO TABS: 50.0000 mg | ORAL_TABLET | Freq: Four times a day (QID) | ORAL | 0 refills | Status: DC | PRN

## 2021-11-04 MED ORDER — ONDANSETRON HCL 4 MG/2ML IJ SOLN
INTRAMUSCULAR | Status: DC | PRN
  Administered 2021-11-04: 4 mg via INTRAVENOUS

## 2021-11-04 MED ORDER — PHENYLEPHRINE 40 MCG/ML (10ML) SYRINGE FOR IV PUSH (FOR BLOOD PRESSURE SUPPORT)
PREFILLED_SYRINGE | INTRAVENOUS | Status: DC | PRN
  Administered 2021-11-04 (×3): 40 ug via INTRAVENOUS
  Administered 2021-11-04: 120 ug via INTRAVENOUS
  Administered 2021-11-04: 40 ug via INTRAVENOUS

## 2021-11-04 MED ORDER — FENTANYL CITRATE PF 50 MCG/ML IJ SOSY: 25.0000 ug | PREFILLED_SYRINGE | INTRAMUSCULAR | Status: DC | PRN

## 2021-11-04 MED ORDER — ORAL CARE MOUTH RINSE: 15.0000 mL | Freq: Once | OROMUCOSAL | Status: AC

## 2021-11-04 MED ORDER — FENTANYL CITRATE (PF) 100 MCG/2ML IJ SOLN
INTRAMUSCULAR | Status: AC
  Filled 2021-11-04: qty 2

## 2021-11-04 MED ORDER — DEXAMETHASONE SODIUM PHOSPHATE 4 MG/ML IJ SOLN
INTRAMUSCULAR | Status: DC | PRN
  Administered 2021-11-04: 5 mg via INTRAVENOUS

## 2021-11-04 MED ORDER — LIDOCAINE 2% (20 MG/ML) 5 ML SYRINGE
INTRAMUSCULAR | Status: DC | PRN
  Administered 2021-11-04: 60 mg via INTRAVENOUS

## 2021-11-04 MED ORDER — PHENYLEPHRINE 40 MCG/ML (10ML) SYRINGE FOR IV PUSH (FOR BLOOD PRESSURE SUPPORT)
PREFILLED_SYRINGE | INTRAVENOUS | Status: AC
  Filled 2021-11-04: qty 10

## 2021-11-04 MED ORDER — LIDOCAINE HCL (PF) 2 % IJ SOLN
INTRAMUSCULAR | Status: AC
  Filled 2021-11-04: qty 5

## 2021-11-04 MED ORDER — ONDANSETRON HCL 4 MG/2ML IJ SOLN
INTRAMUSCULAR | Status: AC
  Filled 2021-11-04: qty 2

## 2021-11-04 MED ORDER — FENTANYL CITRATE (PF) 100 MCG/2ML IJ SOLN
INTRAMUSCULAR | Status: DC | PRN
  Administered 2021-11-04 (×4): 50 ug via INTRAVENOUS

## 2021-11-04 MED ORDER — IOHEXOL 300 MG/ML  SOLN
INTRAMUSCULAR | Status: DC | PRN
  Administered 2021-11-04: 5 mL via URETHRAL

## 2021-11-04 MED ORDER — PROPOFOL 10 MG/ML IV BOLUS
INTRAVENOUS | Status: DC | PRN
  Administered 2021-11-04: 50 mg via INTRAVENOUS

## 2021-11-04 MED ORDER — ACETAMINOPHEN 500 MG PO TABS
1000.0000 mg | ORAL_TABLET | Freq: Once | ORAL | Status: AC
  Administered 2021-11-04: 1000 mg via ORAL
  Filled 2021-11-04: qty 2

## 2021-11-04 MED ORDER — CHLORHEXIDINE GLUCONATE 0.12 % MT SOLN
15.0000 mL | Freq: Once | OROMUCOSAL | Status: AC
  Administered 2021-11-04: 15 mL via OROMUCOSAL

## 2021-11-04 SURGICAL SUPPLY — 21 items
BAG URO CATCHER STRL LF (MISCELLANEOUS) ×2 IMPLANT
BASKET ZERO TIP NITINOL 2.4FR (BASKET) IMPLANT
BSKT STON RTRVL ZERO TP 2.4FR (BASKET)
CATH URETL OPEN 5X70 (CATHETERS) ×2 IMPLANT
CLOTH BEACON ORANGE TIMEOUT ST (SAFETY) ×2 IMPLANT
EXTRACTOR STONE 1.7FRX115CM (UROLOGICAL SUPPLIES) ×1 IMPLANT
GLOVE SURG ENC TEXT LTX SZ7.5 (GLOVE) ×2 IMPLANT
GOWN STRL REUS W/TWL XL LVL3 (GOWN DISPOSABLE) ×2 IMPLANT
GUIDEWIRE ANG ZIPWIRE 038X150 (WIRE) IMPLANT
GUIDEWIRE STR DUAL SENSOR (WIRE) ×3 IMPLANT
IV NS IRRIG 3000ML ARTHROMATIC (IV SOLUTION) ×1 IMPLANT
MANIFOLD NEPTUNE II (INSTRUMENTS) ×2 IMPLANT
PACK CYSTO (CUSTOM PROCEDURE TRAY) ×2 IMPLANT
SHEATH NAVIGATOR HD 11/13X28 (SHEATH) IMPLANT
SHEATH NAVIGATOR HD 11/13X36 (SHEATH) IMPLANT
STENT URET 6FRX24 CONTOUR (STENTS) ×1 IMPLANT
TRACTIP FLEXIVA PULS ID 200XHI (Laser) IMPLANT
TRACTIP FLEXIVA PULSE ID 200 (Laser) ×2
TUBING CONNECTING 10 (TUBING) ×2 IMPLANT
TUBING UROLOGY SET (TUBING) ×2 IMPLANT
WATER STERILE IRR 500ML POUR (IV SOLUTION) ×1 IMPLANT

## 2021-11-04 NOTE — Transfer of Care (Signed)
Immediate Anesthesia Transfer of Care Note  Patient: [name]  Procedure(s) Performed: CYSTOSCOPY WITH RETROGRADE PYELOGRAM, URETEROSCOPY  HOLMIUM LASER AND STENT PLACEMENT (Left: Ureter)  Patient Location: PACU  Anesthesia Type:General  Level of Consciousness: awake and patient cooperative  Airway & Oxygen Therapy: Patient Spontanous Breathing and Patient connected to face mask  Post-op Assessment: Report given to RN and Post -op Vital signs reviewed and stable  Post vital signs: Reviewed and stable  Last Vitals:  Vitals Value Taken Time  BP 138/61 11/04/21 1233  Temp 36.2 C 11/04/21 1232  Pulse 58 11/04/21 1240  Resp 14 11/04/21 1240  SpO2 100 % 11/04/21 1240  Vitals shown include unvalidated device data.  Last Pain:  Vitals:   11/04/21 0920  TempSrc:   PainSc: 0-No pain      Patients Stated Pain Goal: 3 (56/81/27 5170)  Complications: No notable events documented.

## 2021-11-04 NOTE — Anesthesia Preprocedure Evaluation (Addendum)
Anesthesia Evaluation  Patient identified by MRN, date of birth, ID band Patient awake    Reviewed: Allergy & Precautions, H&P , NPO status , Patient's Chart, lab work & pertinent test results, reviewed documented beta blocker date and time   Airway Mallampati: II  TM Distance: >3 FB Neck ROM: Full    Dental no notable dental hx. (+) Edentulous Upper, Partial Lower, Dental Advisory Given   Pulmonary COPD,  COPD inhaler, former smoker,    Pulmonary exam normal breath sounds clear to auscultation       Cardiovascular hypertension, Pt. on medications and Pt. on home beta blockers +CHF   Rhythm:Regular Rate:Normal     Neuro/Psych Anxiety Depression TIACVA, Residual Symptoms    GI/Hepatic Neg liver ROS, hiatal hernia, GERD  Medicated,  Endo/Other  negative endocrine ROS  Renal/GU Renal disease  negative genitourinary   Musculoskeletal  (+) Arthritis , Osteoarthritis,    Abdominal   Peds  Hematology negative hematology ROS (+)   Anesthesia Other Findings   Reproductive/Obstetrics negative OB ROS                            Anesthesia Physical Anesthesia Plan  ASA: 3  Anesthesia Plan: General   Post-op Pain Management: Tylenol PO (pre-op)   Induction: Intravenous  PONV Risk Score and Plan: 4 or greater and Ondansetron, Dexamethasone and Treatment may vary due to age or medical condition  Airway Management Planned: LMA  Additional Equipment:   Intra-op Plan:   Post-operative Plan: Extubation in OR  Informed Consent: I have reviewed the patients History and Physical, chart, labs and discussed the procedure including the risks, benefits and alternatives for the proposed anesthesia with the patient or authorized representative who has indicated his/her understanding and acceptance.     Dental advisory given  Plan Discussed with: CRNA  Anesthesia Plan Comments:          Anesthesia Quick Evaluation

## 2021-11-04 NOTE — Op Note (Signed)
Preoperative diagnosis: left ureteral calculus  Postoperative diagnosis: left ureteral calculus  Procedure:  Cystoscopy left ureteroscopy and stone removal Ureteroscopic laser lithotripsy left 86F x 24cm ureteral stent placement  left retrograde pyelography with interpretation  Surgeon: Ardis Hughs, MD  Anesthesia: General  Complications: None  Intraoperative findings:  #1: Left retrograde pyelography demonstrated a filling defect within the left ureter consistent with the patient's known calculus without other abnormalities. #2: Patient had significant prolapse.  Initially had a difficult time cannulate the patient's left ureteral orifice.  I ultimately did packed the vagina with some packing to help facilitate the remainder of the case.  EBL: Minimal  Specimens: left ureteral calculus  Disposition of specimens: [facility] for stone analysis  Indication: [name] is a 80 y.o.   patient with a left ureteral stone and associated bleeding. After reviewing the management options for treatment, the patient elected to proceed with the above surgical procedure(s). We have discussed the potential benefits and risks of the procedure, side effects of the proposed treatment, the likelihood of the patient achieving the goals of the procedure, and any potential problems that might occur during the procedure or recuperation. Informed consent has been obtained.   Description of procedure:  The patient was taken to the operating room and general anesthesia was induced.  The patient was placed in the dorsal lithotomy position, prepped and draped in the usual sterile fashion, and preoperative antibiotics were administered. A preoperative time-out was performed.   Cystourethroscopy was performed.  The patient's urethra was examined and was normal.  She did have quite significant prolapse, which displaced the trigone inferiorly and made it difficult to cannulate the  patient's left ureteral orifice.  I used a lap to pack the vagina and help facilitate access to the left ureteral orifice.  The bladder was then systematically examined in its entirety. There was no evidence for any bladder tumors, stones, or other mucosal pathology.   Attention then turned to the left ureteral orifice and a ureteral catheter was used to intubate the ureteral orifice.  Omnipaque contrast was injected through the ureteral catheter and a retrograde pyelogram was performed with findings as dictated above.  A 0.38 sensor guidewire was then advanced up the left ureter into the renal pelvis under fluoroscopic guidance. The 6 Fr semirigid ureteroscope was then advanced into the ureter next to the guidewire and no calculus was identified.  I subsequently advanced a wire through the scope and remove the scope over the wire.  I passed a single lumen flexible ureteroscope over the wire and into the right renal pelvis atraumatically under fluoroscopic guidance.  I then encountered the stone in the lower pole.  I used a basket to remove the stone into the upper pole.  I subsequently used a 70 m laser fiber and dusted the stone into very small fragments that were easily passable.  I reinspected the area to ensure that all fragments had been reduced to sizes that would not obstruct.   Reinspection of the ureter revealed no remaining visible stones or fragments.   The wire was then backloaded through the cystoscope and a ureteral stent was advance over the wire using Seldinger technique.  The stent was positioned appropriately under fluoroscopic and cystoscopic guidance.  The wire was then removed with an adequate stent curl noted in the renal pelvis as well as in the bladder.  The bladder was then emptied and the procedure ended.  The patient appeared to tolerate the procedure  well and without complications.  The patient was able to be awakened and transferred to the recovery unit in satisfactory  condition.   Disposition: The tether of the stent was left on and tucked inside the patient's vagina. The patient will be scheduled for stent removal in 5-7 days in our clinic.

## 2021-11-04 NOTE — Discharge Instructions (Signed)
DISCHARGE INSTRUCTIONS FOR KIDNEY STONE/URETERAL STENT   MEDICATIONS:  1. Resume all your other meds from home - except do not take any extra narcotic pain meds that you may have at home.  2. Pyridium is to help with the burning/stinging when you urinate. 3. Tramadol is for moderate/severe pain, otherwise taking upto 1000 mg every 6 hours of plainTylenol will help treat your pain.     ACTIVITY:  1. No strenuous activity x 1week  2. No driving while on narcotic pain medications  3. Drink plenty of water  4. Continue to walk at home - you can still get blood clots when you are at home, so keep active, but don't over do it.  5. May return to work/school tomorrow or when you feel ready   BATHING:  1. You can shower and we recommend daily showers  2. You have a string coming from your urethra: The stent string is attached to your ureteral stent. Do not pull on this.   SIGNS/SYMPTOMS TO CALL:  Please call us if you have a fever greater than 101.5, uncontrolled nausea/vomiting, uncontrolled pain, dizziness, unable to urinate, bloody urine, chest pain, shortness of breath, leg swelling, leg pain, redness around wound, drainage from wound, or any other concerns or questions.   You can reach [place] at [phone].   FOLLOW-UP:  1.   You have an appointment for stent removal in 1 week.

## 2021-11-04 NOTE — Interval H&P Note (Signed)
History and Physical Interval Note:  11/04/2021 11:01 AM  [name]  has presented today for surgery, with the diagnosis of LEFT RENAL STONE.  The various methods of treatment have been discussed with the patient and family. After consideration of risks, benefits and other options for treatment, the patient has consented to  Procedure(s): CYSTOSCOPY WITH RETROGRADE PYELOGRAM, URETEROSCOPY  HOLMIUM LASER AND STENT PLACEMENT (Left) as a surgical intervention.  The patient's history has been reviewed, patient examined, no change in status, stable for surgery.  I have reviewed the patient's chart and labs.  Questions were answered to the patient's satisfaction.     Ardis Hughs

## 2021-11-04 NOTE — Anesthesia Procedure Notes (Signed)
Procedure Name: LMA Insertion Date/Time: 11/04/2021 11:13 AM Performed by: Claudia Desanctis, CRNA Pre-anesthesia Checklist: Emergency Drugs available, Patient identified, Suction available and Patient being monitored Patient Re-evaluated:Patient Re-evaluated prior to induction Oxygen Delivery Method: Circle system utilized Preoxygenation: Pre-oxygenation with 100% oxygen Induction Type: IV induction Ventilation: Mask ventilation without difficulty LMA: LMA inserted LMA Size: 4.0 Number of attempts: 1 Placement Confirmation: positive ETCO2 and breath sounds checked- equal and bilateral Tube secured with: Tape Dental Injury: Teeth and Oropharynx as per pre-operative assessment

## 2021-11-04 NOTE — H&P (Signed)
CC/HPI: [name] is a patient with prolapse surgery in 2008. She had extensive back surgery of the thoracic and lumbar spine in July 2016 and she currently wears a brace when she stands. At one point in time she was getting up 10 times a night and now she gets up 4 to 5 times. She only voids 3 to 4 times during the day. She has 1 pad a day urge incontinence and denies stress incontinence. She says her flow is better at night.   When I saw [name] in October 2016, she had multiple TIAs. She was on VESIcare and I thought she had a nocturnal diuresis. It helped but she became constipated and she went on Myrbetriq.  [name] is now getting up twice. She is doing great on Myrbetriq. She wants #90 x3 sent to her mail order which is [facility]. I will assess durability in 4 months and then see her once a year. I gave her a few more weeks of samples.   I did mention DDAVP to [name] which may get approved in March 2017.   last  Nocturia 2. 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 Some hesitancy during the day.  Prescription renewed with 3 month supply and 3 refills   Today  The patient still gets up twice a night. She wears 2 pads a day that are damp for urge incontinence but I think it is a little bit worse on her Lasix. I am not going to change treatment. She has not had bladder infections or blood in the urine.   I did give her a samples. It was interesting that she was still unhappy from last year's visit. Prescription renewed with 90 tablets and 3 refills. I will see her in 1 year   Today  Urge incontinence dramatically better. Frequency stable. No infection   90 x 3 and see in 1 year on Myrbetriq   Today  Overall urge incontinence much better on Myrbetriq. In the last 3 months her hesitancy is a bit worse and she runs water to initiate urination. Clinically not infected. Frequency stable  90 x 3 sent. Call if urine cultures positive and reassurance given   Today  Urge incontinence  much better on Myrbetriq. Prescription renewed. Has intermittent right-sided than left-sided back pain. Has chronic renal insufficiency with the glomerular filtration rate of less than 40 mL/minute. Showed me the blood work. She said she had an x-ray that showed bilateral stones likely nonobstructing   Patient had renal ultrasound with a 5 mm nonobstructing stone in the lower pole the right kidney and a 4 mm nonobstructing stone left kidney   No blood or infection.   picture drawn. Stones not cause of the renal insufficiency her back pain. Prescription renewed and I will see in 1 year   10/19/2021: 80 year old female who presents today with onset of gross hematuria that began 2 weeks ago and has been intermittent. She states at times her urine is very dark red and at other times it is pink-tinged. She denies dysuria, urinary clots, fevers, chills. She endorses some lower back pain and discomfort. She does have a history of nonobstructing stones bilaterally. She is on Eliquis. Urinalysis is not overly concerning for infection.     ALLERGIES: Biaxin TABS Cardizem Contrast Dye Statins     MEDICATIONS: Levothyroxine Sodium  Metoprolol Succinate  Myrbetriq 50 mg tablet, extended release 24 hr 1 tablet PO Daily  Albuterol 90 mcg aerosol Inhalation  Apixaban  Azelastine Hcl  Benzonatate  Biotin  Bupropion Hcl  Calcium + D  Cetirizine Hcl 10 mg tablet  Cyclobenzaprine Hcl  Eliquis  Ezetimibe  Fluticasone Propionate  Flutter Devi  Furosemide  Gabapentin  Guaifenesin  Lidocaine  Lorazepam  Melatonin  Multivitamin  Nystatin  Omeprazole 20 mg tablet, delayed release Oral  Probiotic  Ranitidine Hcl 150 mg capsule  Singulair 10 mg tablet Oral  Spiriva Handihaler 18 mcg capsule, with inhalation device Inhalation  Trazodone Hcl 50 mg tablet  Trelegy  Tylenol  Tylenol Arthritis     GU PSH: Hysterectomy Unilat SO 02/18/06       PSH Notes: Hand Repair, Spine Repair, Hysterectomy,  Bladder Surgery   NON-GU PSH: None   GU PMH: Nocturia - 06/09/2021, Nocturia, - February 19, 2015 Urge incontinence - 06/09/2021, 2018/02/18 Urinary Frequency - 18-Feb-2018 Renal calculus, Nephrolithiasis - 02-19-2015 History of urolithiasis, History of kidney stones - 19-Feb-2015      PMH Notes:  2006-12-04 12:26:56 - Note: Arthritis   NON-GU PMH: Anxiety, Anxiety - 2015-02-19 Encounter for general adult medical examination without abnormal findings, Encounter for preventive health examination - 02-19-2015 Personal history of other diseases of the respiratory system, History of chronic obstructive lung disease - Feb 19, 2015 Personal history of other diseases of the circulatory system, History of hypertension - 18-Feb-2013 Personal history of other endocrine, nutritional and metabolic disease, History of hypercholesterolemia - 2013/02/18 Personal history of other specified conditions, History of heartburn - Feb 18, 2013 Personal history of transient ischemic attack (TIA), and cerebral infarction without residual deficits, History of transient cerebral ischemia - February 18, 2013    FAMILY HISTORY: Death of parent - Runs In Family Kidney Stones - Runs In Family   SOCIAL HISTORY: None    Notes: Former smoker, Two children, Married, Tobacco Use, Caffeine Use, Death In The Family Mother   REVIEW OF SYSTEMS:    GU Review Female:   Patient reports frequent urination. Patient denies hard to postpone urination, burning /pain with urination, get up at night to urinate, leakage of urine, stream starts and stops, trouble starting your stream, have to strain to urinate, and being pregnant.  Gastrointestinal (Upper):   Patient denies nausea, vomiting, and indigestion/ heartburn.  Gastrointestinal (Lower):   Patient denies diarrhea and constipation.  Constitutional:   Patient denies fever, night sweats, weight loss, and fatigue.  Musculoskeletal:   Patient denies back pain and joint pain.  Neurological:   Patient denies headaches and dizziness.  Psychologic:   Patient denies depression  and anxiety.   Notes: blood in urine sometimes    VITAL SIGNS:      10/19/2021 03:21 PM  Weight 133 lb / 60.33 kg  Height 64 in / 162.56 cm  BP 130/75 mmHg  Pulse 67 /min  Temperature 97.7 F / 36.5 C  BMI 22.8 kg/m   GU PHYSICAL EXAMINATION:      Notes: No CVA tenderness   MULTI-SYSTEM PHYSICAL EXAMINATION:    Constitutional: Thin. No physical deformities. Normally developed. Good grooming.   Respiratory: No labored breathing, no use of accessory muscles.   Cardiovascular: Normal temperature, normal extremity pulses, no swelling, no varicosities.  Skin: No paleness, no jaundice, no cyanosis. No lesion, no ulcer, no rash.  Neurologic / Psychiatric: Oriented to time, oriented to place, oriented to person. No depression, no anxiety, no agitation.  Gastrointestinal: No mass, no tenderness, no rigidity, non obese abdomen.     Complexity of Data:  Source Of History:  Patient  Records Review:  Previous Doctor Records, Previous Patient Records  Urine Test Review:   Urinalysis, Urine Culture   10/19/21  Urinalysis  Urine Appearance Cloudy   Urine Color Amber   Urine Glucose Neg mg/dL  Urine Bilirubin Neg mg/dL  Urine Ketones Neg mg/dL  Urine Specific Gravity 1.020   Urine Blood 3+ ery/uL  Urine pH 5.5   Urine Protein Trace mg/dL  Urine Urobilinogen 0.2 mg/dL  Urine Nitrites Neg   Urine Leukocyte Esterase Neg leu/uL  Urine WBC/hpf 0 - 5/hpf   Urine RBC/hpf >60/hpf   Urine Epithelial Cells 0 - 5/hpf   Urine Bacteria Few (10-25/hpf)   Urine Mucous Not Present   Urine Yeast NS (Not Seen)   Urine Trichomonas Not Present   Urine Cystals NS (Not Seen)   Urine Casts NS (Not Seen)   Urine Sperm Not Present    PROCEDURES:         C.T. Urogram - [id number]      Patient confirmed No Neulasta OnPro Device.         Urinalysis w/Scope Dipstick Dipstick Cont'd Micro  Color: Amber Bilirubin: Neg mg/dL WBC/hpf: 0 - 5/hpf  Appearance: Cloudy Ketones: Neg mg/dL RBC/hpf: >60/hpf   Specific Gravity: 1.020 Blood: 3+ ery/uL Bacteria: Few (10-25/hpf)  pH: 5.5 Protein: Trace mg/dL Cystals: NS (Not Seen)  Glucose: Neg mg/dL Urobilinogen: 0.2 mg/dL Casts: NS (Not Seen)    Nitrites: Neg Trichomonas: Not Present    Leukocyte Esterase: Neg leu/uL Mucous: Not Present      Epithelial Cells: 0 - 5/hpf      Yeast: NS (Not Seen)      Sperm: Not Present    ASSESSMENT:      ICD-10 Details  1 GU:   Renal calculus - N20.0 Left, Acute, Uncomplicated   PLAN:            Medications New Meds: Oxycodone-Acetaminophen 5 mg-325 mg tablet 1 tablet PO Q 8 H PRN as needed for kidney stone pain  #15  0 Refill(s)            Orders Labs CULTURE, URINE  X-Rays: C.T. Stone Protocol Without I.V. Contrast  X-Ray Notes: History:  Hematuria: Yes/No  Patient to see MD after exam: Yes/No  Previous exam: CT / IVP/ US/ KUB/ None  When:  Where:  Diabetic: Yes/ No  BUN/ Creatinine:  Date of last BUN Creatinine:  Weight in pounds:  Allergy- IV Contrast: Yes/ No  Conflicting diabetic meds: Yes/ No  Diabetic Meds:  Prior Authorization #: [id number]            Schedule         Document Letter(s):  Created for Patient: Clinical Summary         Notes:   Urinalysis was sent for precautionary culture today. CT imaging shows a left-sided renal pelvis stone that is not causing obstruction this measures approximately 8 mm x 8 mm. This is the likely source of her gross hematuria and intermittent lower back pain and discomfort. Urinalysis will be sent for precautionary culture today.  Stone intervention was discussed in detail today. She opted to proceed with ureteroscopy. A surgical posting sheet was placed.   For ureteroscopy, the patient understands that there is a chance for a staged procedure. Patient also understands that there is risk for bleeding, infection, injury to surrounding organs, and general risks of anesthesia. The patient also understands the placement of a stent and  the risks of stent placement including, risk for infection, the  risk for pain, and the risk for injury.

## 2021-11-05 ENCOUNTER — Encounter: Payer: Self-pay | Admitting: Internal Medicine

## 2021-11-05 ENCOUNTER — Other Ambulatory Visit: Payer: Self-pay

## 2021-11-05 ENCOUNTER — Other Ambulatory Visit (HOSPITAL_COMMUNITY): Payer: Self-pay | Admitting: Orthopedic Surgery

## 2021-11-05 MED ORDER — MONTELUKAST SODIUM 10 MG PO TABS: 10.0000 mg | ORAL_TABLET | Freq: Every day | ORAL | 1 refills | Status: AC

## 2021-11-05 NOTE — Anesthesia Postprocedure Evaluation (Signed)
Anesthesia Post Note  Patient: [name]  Procedure(s) Performed: CYSTOSCOPY WITH RETROGRADE PYELOGRAM, URETEROSCOPY  HOLMIUM LASER AND STENT PLACEMENT (Left: Ureter)     Patient location during evaluation: PACU Anesthesia Type: General Level of consciousness: awake and alert Pain management: pain level controlled Vital Signs Assessment: post-procedure vital signs reviewed and stable Respiratory status: spontaneous breathing, nonlabored ventilation and respiratory function stable Cardiovascular status: blood pressure returned to baseline and stable Postop Assessment: no apparent nausea or vomiting Anesthetic complications: no   No notable events documented.  Last Vitals:  Vitals:   11/04/21 1300 11/04/21 1322  BP: 140/89 (!) 152/81  Pulse: 91 80  Resp: 18 16  Temp: 36.7 C   SpO2: 93% 96%    Last Pain:  Vitals:   11/04/21 1322  TempSrc:   PainSc: 3                  Addalie Calles,W. EDMOND

## 2021-11-06 ENCOUNTER — Encounter (HOSPITAL_COMMUNITY): Payer: Self-pay | Admitting: Urology

## 2021-11-10 DIAGNOSIS — M25511 Pain in right shoulder: Secondary | ICD-10-CM | POA: Diagnosis not present

## 2021-11-10 DIAGNOSIS — N200 Calculus of kidney: Secondary | ICD-10-CM | POA: Diagnosis not present

## 2021-11-10 DIAGNOSIS — S42201A Unspecified fracture of upper end of right humerus, initial encounter for closed fracture: Secondary | ICD-10-CM | POA: Diagnosis not present

## 2021-11-11 ENCOUNTER — Other Ambulatory Visit (HOSPITAL_COMMUNITY): Payer: Medicare Other

## 2021-11-12 MED ORDER — TRELEGY ELLIPTA 100-62.5-25 MCG/ACT IN AEPB: 1.0000 | INHALATION_SPRAY | Freq: Every day | RESPIRATORY_TRACT | 3 refills | Status: DC

## 2021-11-17 DIAGNOSIS — S42251D Displaced fracture of greater tuberosity of right humerus, subsequent encounter for fracture with routine healing: Secondary | ICD-10-CM | POA: Diagnosis not present

## 2021-11-19 ENCOUNTER — Ambulatory Visit (INDEPENDENT_AMBULATORY_CARE_PROVIDER_SITE_OTHER): Payer: Medicare Other | Admitting: Internal Medicine

## 2021-11-19 ENCOUNTER — Other Ambulatory Visit: Payer: Self-pay

## 2021-11-19 ENCOUNTER — Ambulatory Visit (INDEPENDENT_AMBULATORY_CARE_PROVIDER_SITE_OTHER): Payer: Medicare Other

## 2021-11-19 ENCOUNTER — Encounter: Payer: Self-pay | Admitting: Internal Medicine

## 2021-11-19 VITALS — BP 120/68 | HR 104 | Temp 97.8°F | Ht 64.0 in | Wt 126.6 lb

## 2021-11-19 DIAGNOSIS — I10 Essential (primary) hypertension: Secondary | ICD-10-CM

## 2021-11-19 DIAGNOSIS — R739 Hyperglycemia, unspecified: Secondary | ICD-10-CM

## 2021-11-19 DIAGNOSIS — Z0000 Encounter for general adult medical examination without abnormal findings: Secondary | ICD-10-CM

## 2021-11-19 DIAGNOSIS — N1831 Chronic kidney disease, stage 3a: Secondary | ICD-10-CM

## 2021-11-19 DIAGNOSIS — E7800 Pure hypercholesterolemia, unspecified: Secondary | ICD-10-CM | POA: Diagnosis not present

## 2021-11-19 NOTE — Patient Instructions (Signed)
[name] , Thank you for taking time to come for your Medicare Wellness Visit. I appreciate your ongoing commitment to your health goals. Please review the following plan we discussed and let me know if I can assist you in the future.   Screening recommendations/referrals: Colonoscopy: Not a candidate for screening due to age 80: 06/11/2021; due every 1-2 years Bone Density: 09/01/2020; due every 2 years  Recommended yearly ophthalmology/optometry visit for glaucoma screening and checkup Recommended yearly dental visit for hygiene and checkup  Vaccinations: Influenza vaccine: 09/16/2021; due every Fall season Pneumococcal vaccine: 12/05/2006, 11/29/2013 Tdap vaccine: 06/04/2016; due every 10 years Shingles vaccine: 04/12/2017, 06/19/2017   Covid-19: 12/18/2019, 01/08/2020, 09/01/2020, 05/11/2021, 10/22/2021  Advanced directives: Please bring a copy of your health care power of attorney and living will to the office at your convenience.  Conditions/risks identified: Yes; Client understands the importance of follow-up with providers by attending scheduled visits and discussed goals to eat healthier, increase physical activity, exercise the brain, socialize more, get enough sleep and make time for laughter.  Next appointment: Please schedule your next Medicare Wellness Visit with your Nurse Health Advisor in 1 year by calling [phone].   Preventive Care 25 Years and Older, Female Preventive care refers to lifestyle choices and visits with your health care provider that can promote health and wellness. What does preventive care include? A yearly physical exam. This is also called an annual well check. Dental exams once or twice a year. Routine eye exams. Ask your health care provider how often you should have your eyes checked. Personal lifestyle choices, including: Daily care of your teeth and gums. Regular physical activity. Eating a healthy diet. Avoiding tobacco and drug use. Limiting  alcohol use. Practicing safe sex. Taking low-dose aspirin every day. Taking vitamin and mineral supplements as recommended by your health care provider. What happens during an annual well check? The services and screenings done by your health care provider during your annual well check will depend on your age, overall health, lifestyle risk factors, and family history of disease. Counseling  Your health care provider may ask you questions about your: Alcohol use. Tobacco use. Drug use. Emotional well-being. Home and relationship well-being. Sexual activity. Eating habits. History of falls. Memory and ability to understand (cognition). Work and work Statistician. Reproductive health. Screening  You may have the following tests or measurements: Height, weight, and BMI. Blood pressure. Lipid and cholesterol levels. These may be checked every 5 years, or more frequently if you are over 1 years old. Skin check. Lung cancer screening. You may have this screening every year starting at age 86 if you have a 30-pack-year history of smoking and currently smoke or have quit within the past 15 years. Fecal occult blood test (FOBT) of the stool. You may have this test every year starting at age 37. Flexible sigmoidoscopy or colonoscopy. You may have a sigmoidoscopy every 5 years or a colonoscopy every 10 years starting at age 11. Hepatitis C blood test. Hepatitis B blood test. Sexually transmitted disease (STD) testing. Diabetes screening. This is done by checking your blood sugar (glucose) after you have not eaten for a while (fasting). You may have this done every 1-3 years. Bone density scan. This is done to screen for osteoporosis. You may have this done starting at age 68. Mammogram. This may be done every 1-2 years. Talk to your health care provider about how often you should have regular mammograms. Talk with your health care provider about your test  results, treatment options, and if  necessary, the need for more tests. Vaccines  Your health care provider may recommend certain vaccines, such as: Influenza vaccine. This is recommended every year. Tetanus, diphtheria, and acellular pertussis (Tdap, Td) vaccine. You may need a Td booster every 10 years. Zoster vaccine. You may need this after age 5. Pneumococcal 13-valent conjugate (PCV13) vaccine. One dose is recommended after age 30. Pneumococcal polysaccharide (PPSV23) vaccine. One dose is recommended after age 68. Talk to your health care provider about which screenings and vaccines you need and how often you need them. This information is not intended to replace advice given to you by your health care provider. Make sure you discuss any questions you have with your health care provider. Document Released: 12/18/2015 Document Revised: 08/10/2016 Document Reviewed: 09/22/2015 Elsevier Interactive Patient Education  2017 [place] Prevention in the Home Falls can cause injuries. They can happen to people of all ages. There are many things you can do to make your home safe and to help prevent falls. What can I do on the outside of my home? Regularly fix the edges of walkways and driveways and fix any cracks. Remove anything that might make you trip as you walk through a door, such as a raised step or threshold. Trim any bushes or trees on the path to your home. Use bright outdoor lighting. Clear any walking paths of anything that might make someone trip, such as rocks or tools. Regularly check to see if handrails are loose or broken. Make sure that both sides of any steps have handrails. Any raised decks and porches should have guardrails on the edges. Have any leaves, snow, or ice cleared regularly. Use sand or salt on walking paths during winter. Clean up any spills in your garage right away. This includes oil or grease spills. What can I do in the bathroom? Use night lights. Install grab bars by the toilet  and in the tub and shower. Do not use towel bars as grab bars. Use non-skid mats or decals in the tub or shower. If you need to sit down in the shower, use a plastic, non-slip stool. Keep the floor dry. Clean up any water that spills on the floor as soon as it happens. Remove soap buildup in the tub or shower regularly. Attach bath mats securely with double-sided non-slip rug tape. Do not have throw rugs and other things on the floor that can make you trip. What can I do in the bedroom? Use night lights. Make sure that you have a light by your bed that is easy to reach. Do not use any sheets or blankets that are too big for your bed. They should not hang down onto the floor. Have a firm chair that has side arms. You can use this for support while you get dressed. Do not have throw rugs and other things on the floor that can make you trip. What can I do in the kitchen? Clean up any spills right away. Avoid walking on wet floors. Keep items that you use a lot in easy-to-reach places. If you need to reach something above you, use a strong step stool that has a grab bar. Keep electrical cords out of the way. Do not use floor polish or wax that makes floors slippery. If you must use wax, use non-skid floor wax. Do not have throw rugs and other things on the floor that can make you trip. What can I do with my stairs?  Do not leave any items on the stairs. Make sure that there are handrails on both sides of the stairs and use them. Fix handrails that are broken or loose. Make sure that handrails are as long as the stairways. Check any carpeting to make sure that it is firmly attached to the stairs. Fix any carpet that is loose or worn. Avoid having throw rugs at the top or bottom of the stairs. If you do have throw rugs, attach them to the floor with carpet tape. Make sure that you have a light switch at the top of the stairs and the bottom of the stairs. If you do not have them, ask someone to add  them for you. What else can I do to help prevent falls? Wear shoes that: Do not have high heels. Have rubber bottoms. Are comfortable and fit you well. Are closed at the toe. Do not wear sandals. If you use a stepladder: Make sure that it is fully opened. Do not climb a closed stepladder. Make sure that both sides of the stepladder are locked into place. Ask someone to hold it for you, if possible. Clearly mark and make sure that you can see: Any grab bars or handrails. First and last steps. Where the edge of each step is. Use tools that help you move around (mobility aids) if they are needed. These include: Canes. Walkers. Scooters. Crutches. Turn on the lights when you go into a dark area. Replace any light bulbs as soon as they burn out. Set up your furniture so you have a clear path. Avoid moving your furniture around. If any of your floors are uneven, fix them. If there are any pets around you, be aware of where they are. Review your medicines with your doctor. Some medicines can make you feel dizzy. This can increase your chance of falling. Ask your doctor what other things that you can do to help prevent falls. This information is not intended to replace advice given to you by your health care provider. Make sure you discuss any questions you have with your health care provider. Document Released: 09/17/2009 Document Revised: 04/28/2016 Document Reviewed: 12/26/2014 Elsevier Interactive Patient Education  2017 [employer].

## 2021-11-19 NOTE — Progress Notes (Addendum)
Subjective:   [name] is a 80 y.o. female who presents for Medicare Annual (Subsequent) preventive examination.  Review of Systems     Cardiac Risk Factors include: advanced age (>40men, >37 women);dyslipidemia;family history of premature cardiovascular disease;hypertension     Objective:    Today's Vitals   11/19/21 1037 11/19/21 1039  BP:  120/68  Pulse:  (!) 104  Temp:  97.8 F (36.6 C)  SpO2:  95%  Weight:  126 lb 9.6 oz (57.4 kg)  Height:  5\' 4"  (1.626 m)  PainSc: 0-No pain 0-No pain   Body mass index is 21.73 kg/m.  Advanced Directives 11/19/2021 11/19/2021 10/26/2021 08/17/2020 08/08/2019 07/27/2018 12/20/2016  Does Patient Have a Medical Advance Directive? Yes Yes Yes Yes Yes Yes No  Type of Advance Directive Living will Living will Living will [place];Living will [place];Living will -  Does patient want to make changes to medical advance directive? - - - No - Patient declined - - -  Copy of [place] in Chart? - - - No - copy requested No - copy requested No - copy requested -  Would patient like information on creating a medical advance directive? - - - - - - No - Patient declined  Pre-existing out of facility DNR order (yellow form or pink MOST form) - - - - - - -    Current Medications (verified) Outpatient Encounter Medications as of 11/19/2021  Medication Sig   albuterol (VENTOLIN HFA) 108 (90 Base) MCG/ACT inhaler Inhale 1-2 puffs into the lungs every 6 (six) hours as needed for wheezing or shortness of breath.   azelastine (ASTELIN) 0.1 % nasal spray Place 2 sprays into the nose daily. Use in each nostril as directed   benzonatate (TESSALON) 200 MG capsule Take 1 capsule (200 mg total) by mouth 3 (three) times daily as needed for cough.   Biotin 5 MG CAPS Take 5 mg by mouth daily.   buPROPion (WELLBUTRIN XL) 300 MG 24 hr tablet Take 1 tablet (300 mg total) by mouth  daily.   Calcium Carbonate-Vitamin D (CALCIUM + D PO) Take 1 tablet by mouth 2 (two) times daily.   colestipol (COLESTID) 1 g tablet Take 2 tablets (2 g total) by mouth 2 (two) times daily. (Patient taking differently: Take 1 g by mouth 2 (two) times daily.)   cyclobenzaprine (FLEXERIL) 5 MG tablet Take 1 tablet (5 mg total) by mouth 3 (three) times daily as needed for muscle spasms.   diltiazem 2 % GEL Apply 1 application topically 5 (five) times daily. (Patient taking differently: Apply 1 application topically 5 (five) times daily. To be completed prior to surgery on 11/04/2021 per pt)   diphenhydrAMINE (BENADRYL) 50 MG tablet Take 1 tablet (50 mg total) by mouth once for 1 dose. Pt to take 50 mg of Benadryl pm 07/13/21 @ 9:20 AM. Please call [phone] with any questions. (Patient not taking: Reported on 10/25/2021)   ezetimibe (ZETIA) 10 MG tablet Take 1 tablet (10 mg total) by mouth daily.   fluticasone (FLONASE) 50 MCG/ACT nasal spray Place 1 spray into both nostrils 2 (two) times daily.   Fluticasone-Umeclidin-Vilant (TRELEGY ELLIPTA) 100-62.5-25 MCG/ACT AEPB Inhale 1 puff into the lungs daily.   Fluticasone-Umeclidin-Vilant (TRELEGY ELLIPTA) 100-62.5-25 MCG/INH AEPB Inhale 1 puff into the lungs daily. I inhalation once daily   furosemide (LASIX) 20 MG tablet Take 1 tablet (20 mg total) by mouth daily  as needed (swelling). (Patient taking differently: Take 20 mg by mouth daily.)   gabapentin (NEURONTIN) 300 MG capsule 1 tab by mouth in AM, 1 po at midday, then 2 by mouth at bedtime for sleep and pain (Patient taking differently: Take 300-600 mg by mouth See admin instructions. Take 300 mg by mouth in the morning and 600 mg at night)   guaiFENesin (MUCINEX) 600 MG 12 hr tablet Take 1,200 mg by mouth 2 (two) times daily as needed for cough or to loosen phlegm.   hydroxychloroquine (PLAQUENIL) 200 MG tablet Take 400 mg by mouth at bedtime.   ipratropium (ATROVENT) 0.03 % nasal spray Place 2 sprays  into both nostrils 3 (three) times daily as needed for rhinitis. (Patient not taking: Reported on 10/25/2021)   levothyroxine (SYNTHROID) 25 MCG tablet Take 1 tablet (25 mcg total) by mouth daily before breakfast. Annual appt due in Nov must see provider for future refills   Lidocaine HCl 2 % CREA Apply 1 application topically daily as needed (for shingles flares).   loratadine (CLARITIN) 10 MG tablet Take 10 mg by mouth daily.   Melatonin 10 MG TABS Take 10 mg by mouth at bedtime.   metoprolol succinate (TOPROL-XL) 100 MG 24 hr tablet Take 1 tablet (100 mg total) by mouth daily. Take with or immediately following a meal. (Patient taking differently: Take 100 mg by mouth daily. Take with or immediately following a meal. Pt takes in the pm)   mirabegron ER (MYRBETRIQ) 50 MG TB24 tablet Take 50 mg by mouth at bedtime.   montelukast (SINGULAIR) 10 MG tablet Take 1 tablet (10 mg total) by mouth daily. Annual appt due in Nov must see provider for future refills   Multiple Vitamin (MULTIVITAMIN) tablet Take 2 tablets by mouth daily.   omeprazole (PRILOSEC) 40 MG capsule Take 1 capsule (40 mg total) by mouth daily. (Patient taking differently: Take 40 mg by mouth every evening.)   phenazopyridine (PYRIDIUM) 200 MG tablet Take 1 tablet (200 mg total) by mouth 3 (three) times daily as needed for pain.   Probiotic Product (PROBIOTIC DAILY PO) Take 1 capsule by mouth in the morning and at bedtime.   traMADol (ULTRAM) 50 MG tablet Take 1-2 tablets (50-100 mg total) by mouth every 6 (six) hours as needed for moderate pain.   traZODone (DESYREL) 50 MG tablet Take 1 tablet (50 mg total) by mouth at bedtime and may repeat dose one time if needed. (Patient taking differently: Take 100 mg by mouth at bedtime.)   [DISCONTINUED] predniSONE (DELTASONE) 10 MG tablet 4 tabs for 2 days, then 3 tabs for 2 days, 2 tabs for 2 days, then 1 tab for 2 days, then stop (Patient not taking: Reported on 10/18/2021)   No  facility-administered encounter medications on file as of 11/19/2021.    Allergies (verified) Clarithromycin, Lipitor [atorvastatin], Simvastatin, Cardizem [diltiazem hcl], Contrast media [iodinated diagnostic agents], and Rosuvastatin calcium   History: Past Medical History:  Diagnosis Date   Allergic rhinitis    Anxiety    Atrial septal aneurysm    Basal cell carcinoma    Bradycardia    Cataract    bil cataracts removed   Chronic combined systolic and diastolic CHF (congestive heart failure) (HCC)    CKD (chronic kidney disease), stage III (HCC)    Clostridium difficile infection    COPD (chronic obstructive pulmonary disease) ([place])    Depression 11/29/2013   DI (detrusor instability)    Esophageal stricture  Hemorrhoids    Hiatal hernia    History of kidney stones    HLD (hyperlipidemia)    HTN (hypertension)    Ischemic colon ([place])    a. 08/2016 - adm to  [facility] with a ruptured colon s/p right hemicolectomy with ileostomy formation for ischemic and necrotic colon.    Lung nodule    a. followed by pulmonology   Mild CAD    a. minor nonobstructive CAD 08/22/16.   Nephrolithiasis    hx   NICM (nonischemic cardiomyopathy) (HCC)    Orthostatic hypotension    Osteoarthritis    PAF (paroxysmal atrial fibrillation) ([place])    a. diagnosed 07/2016 s/p DCCV 08/01/16 with recurrence, managed with rate control for now.    Pneumonia 2013   PVC's (premature ventricular contractions)    Shingles since Nov 18, 2013   on right arm from shoulder to wrist   Status post dilation of esophageal narrowing    Stroke [facility]) 2001   stroke affected left leg   Syncope    a. 08/2016 felt due to orthostatic hypotension.   TIA (transient ischemic attack) last 2001   anticoagulation therapy on coumadin   Past Surgical History:  Procedure Laterality Date   ANTERIOR LATERAL LUMBAR FUSION 4 LEVELS Right 07/03/2015   Procedure: Right Lumbar one-two, Lumbar two-three, Lumbar three-four,  Lumbar four-five Anterior lateral lumbar interbody fusion;  Surgeon: Erline Levine, MD;  Location: [place] NEURO ORS;  Service: Neurosurgery;  Laterality: Right;  Right L1-2 L2-3 L3-4 L4-5 Anterior lateral lumbar interbody fusion   BALLOON DILATION N/A 03/03/2014   Procedure: BALLOON DILATION;  Surgeon: Inda Castle, MD;  Location: [facility] ENDOSCOPY;  Service: Endoscopy;  Laterality: N/A;   BLADDER SUSPENSION     BREAST BIOPSY Right 07/03/2006   CARDIAC CATHETERIZATION N/A 08/22/2016   Procedure: Left Heart Cath and Coronary Angiography;  Surgeon: Peter M Martinique, MD;  Location: [place] CV LAB;  Service: Cardiovascular;  Laterality: N/A;   CARDIOVERSION N/A 08/01/2016   Procedure: CARDIOVERSION;  Surgeon: Jerline Pain, MD;  Location: [place];  Service: Cardiovascular;  Laterality: N/A;   CARDIOVERSION N/A 10/11/2016   Procedure: CARDIOVERSION;  Surgeon: Sueanne Margarita, MD;  Location: [place] ENDOSCOPY;  Service: Cardiovascular;  Laterality: N/A;   COLONOSCOPY     CYSTOSCOPY WITH RETROGRADE PYELOGRAM, URETEROSCOPY AND STENT PLACEMENT Left 11/04/2021   Procedure: CYSTOSCOPY WITH RETROGRADE PYELOGRAM, URETEROSCOPY  HOLMIUM LASER AND STENT PLACEMENT;  Surgeon: Ardis Hughs, MD;  [facility];  Service: Urology;  Laterality: Left;   ESOPHAGOGASTRODUODENOSCOPY N/A 03/03/2014   Procedure: ESOPHAGOGASTRODUODENOSCOPY (EGD);  Surgeon: Inda Castle, MD;  Location: [facility];  Service: Endoscopy;  Laterality: N/A;   EXCISION OF BASAL CELL CA  2011   FINGER SURGERY     GLUTEUS MINIMUS REPAIR Right 03/15/2016   Procedure: RIGHT HIP GLUTEUS MEDIUS TENDON REPAIR;  Surgeon: Paralee Cancel, MD;  [facility];  Service: Orthopedics;  Laterality: Right;   ileostomy reversal     LUMBAR PERCUTANEOUS PEDICLE SCREW 4 LEVEL Right 07/03/2015   Procedure: Lumbar one-Sacral one Bilateral percutaneous pedicle screws;  Surgeon: Erline Levine, MD;  Location: [place] NEURO ORS;  Service: Neurosurgery;  Laterality:  Right;  L1-S1 Bilateral percutaneous pedicle screws   RECTOCELE REPAIR  2008   A&P REPAIR with vaginal repair-DR. MCDIARMID   THUMB SURGERY Right 10-15 yrs ago   UPPER GASTROINTESTINAL ENDOSCOPY     VAGINAL HYSTERECTOMY  1983   NO BSO-DR. [place]  Family History  Problem Relation Age of Onset   COPD Mother    Breast cancer Mother        mid 32's   Uterine cancer Mother    Emphysema Mother    Breast cancer Maternal Aunt        x 3 aunts, post menopausal   Breast cancer Maternal Aunt    Breast cancer Maternal Aunt    Colon cancer Neg Hx    Esophageal cancer Neg Hx    Rectal cancer Neg Hx    Stomach cancer Neg Hx    Pancreatic cancer Neg Hx    Social History   Socioeconomic History   Marital status: Married    Spouse name: Shanon Brow   Number of children: 2   Years of education: Not on file   Highest education level: Not on file  Occupational History   Occupation: retired    Fish farm manager: RETIRED  Tobacco Use   Smoking status: Former    Packs/day: 3.00    Years: 42.00    Pack years: 126.00    Types: Cigarettes    Start date: 08/04/1954    Quit date: 12/06/1995    Years since quitting: 25.9   Smokeless tobacco: Never  Vaping Use   Vaping Use: Never used  Substance and Sexual Activity   Alcohol use: Never   Drug use: Never   Sexual activity: Not Currently    Birth control/protection: Surgical    Comment: 1st intercourse 80 yo-Fewer than 5 partners  Other Topics Concern   Not on file  Social History Narrative   Retired- Investment banker, operational.       Financial controller Pulmonary:   Originally from [place]. Has always lived in [place]. She has traveled to [place], [place], [place], [place], [place], [place]. Has been on multiple cruises. 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 She did use a face mask consistently to avoid chemical fume exposure. She may have only had an inhaled exposure a  couple of times. She currently has a dog. Remote exposure to a parakeet. No mold or hot tub exposure. No asbestos exposure.    Social Determinants of Health   Financial Resource Strain: Low Risk    Difficulty of Paying Living Expenses: Not hard at all  Food Insecurity: No Food Insecurity   Worried About Charity fundraiser in the Last Year: Never true   [place] in the Last Year: Never true  Transportation Needs: No Transportation Needs   Lack of Transportation (Medical): No   Lack of Transportation (Non-Medical): No  Physical Activity: Sufficiently Active   Days of Exercise per Week: 5 days   Minutes of Exercise per Session: 30 min  Stress: No Stress Concern Present   Feeling of Stress : Not at all  Social Connections: Socially Integrated   Frequency of Communication with Friends and Family: More than three times a week   Frequency of Social Gatherings with Friends and Family: More than three times a week   Attends Religious Services: More than 4 times per year   Active Member of [employer] or Organizations: Yes   Attends Music therapist: More than 4 times per year   Marital Status: Married    Tobacco Counseling Counseling given: Not Answered   Clinical Intake:  Pre-visit preparation completed: Yes  Pain : No/denies pain Pain Score: 0-No pain     Nutritional Status: BMI of 19-24  Normal  Nutritional Risks: Other (Comment) (decreased appetite) Diabetes: No  How often do you need to have someone help you when you read instructions, pamphlets, or other written materials from your doctor or pharmacy?: 1 - Never What is the last grade level you completed in school?: [employer] courses  Diabetic? no  Interpreter Needed?: No  Information entered by :: Lisette Abu, LPN   Activities of Daily Living In your present state of health, do you have any difficulty performing the following activities: 11/19/2021 10/26/2021  Hearing? N  N  Vision? N N  Difficulty concentrating or making decisions? N Y  Walking or climbing stairs? Y Y  Dressing or bathing? N N  Doing errands, shopping? N N  Preparing Food and eating ? N -  Using the Toilet? N -  In the past six months, have you accidently leaked urine? Y -  Do you have problems with loss of bowel control? N -  Managing your Medications? N -  Managing your Finances? N -  Housekeeping or managing your Housekeeping? N -  Some recent data might be hidden    Patient Care Team: Biagio Borg, MD as PCP - General Curt Bears, Ocie Doyne, MD as PCP - Electrophysiology (Cardiology) Constance Haw, MD as PCP - Cardiology (Cardiology) Irene Shipper, MD as Consulting Physician (Gastroenterology) Governor Rooks., DO as Referring Physician (Surgery) Fontaine, Belinda Block, MD (Inactive) as Consulting Physician (Gynecology) Lynnell Dike, OD (Optometry)  Indicate any recent Medical Services you may have received from other than [facility] providers in the past year (date may be approximate).     Assessment:   This is a routine wellness examination for [place].  Hearing/Vision screen No results found.  Dietary issues and exercise activities discussed: Current Exercise Habits: Home exercise routine, Type of exercise: walking, Time (Minutes): 30, Frequency (Times/Week): 5, Weekly Exercise (Minutes/Week): 150, Intensity: Mild, Exercise limited by: orthopedic condition(s);cardiac condition(s);respiratory conditions(s)   Goals Addressed               This Visit's Progress     Patient Stated (pt-stated)        Patient declined health goal at this time.      Depression Screen PHQ 2/9 Scores 11/19/2021 05/20/2021 08/17/2020 04/30/2020 04/30/2020 10/29/2019 08/08/2019  PHQ - 2 Score 0 2 0 0 1 0 1  PHQ- 9 Score - 3 - 0 - - 4    Fall Risk Fall Risk  11/19/2021 05/20/2021 08/17/2020 04/30/2020 10/29/2019  Falls in the past year? 1 0 0 0 0  Number falls in past yr: 0 0 0 - -   Comment - - - - -  Injury with Fall? 1 0 0 - -  Risk for fall due to : History of fall(s);Impaired balance/gait - Impaired balance/gait - -  Follow up Falls prevention discussed - Falls evaluation completed;Education provided - -    FALL RISK PREVENTION PERTAINING TO THE HOME:  Any stairs in or around the home? No  If so, are there any without handrails? No  Home free of loose throw rugs in walkways, pet beds, electrical cords, etc? Yes  Adequate lighting in your home to reduce risk of falls? Yes   ASSISTIVE DEVICES UTILIZED TO PREVENT FALLS:  Life alert? No  Use of a cane, walker or w/c? Yes  Grab bars in the bathroom? Yes  Shower chair or bench in shower? Yes  Elevated toilet seat or a handicapped toilet? Yes   TIMED  UP AND GO:  Was the test performed? Yes .  Length of time to ambulate 10 feet: 12 sec.   Gait slow and steady with assistive device  Cognitive Function: Normal cognitive status assessed by direct observation by this Nurse Health Advisor. No abnormalities found.   MMSE - Mini Mental State Exam 07/27/2018  Orientation to time 5  Orientation to Place 5  Registration 3  Attention/ Calculation 5  Recall 2  Language- name 2 objects 2  Language- repeat 1  Language- follow 3 step command 3  Language- read & follow direction 1  Write a sentence 1  Copy design 1  Total score 29     6CIT Screen 08/17/2020 08/08/2019  What Year? 0 points 0 points  What month? 0 points 0 points  What time? 0 points 0 points  Count back from 20 0 points 0 points  Months in reverse 0 points 0 points  Repeat phrase 2 points 2 points  Total Score 2 2    Immunizations Immunization History  Administered Date(s) Administered   Fluad Quad(high Dose 65+) 08/08/2019, 08/17/2020, 09/16/2021   Influenza Split 09/11/2012   Influenza Whole 09/15/2006, 08/04/2010, 08/11/2011   Influenza, High Dose Seasonal PF 08/22/2017, 09/05/2018, 08/19/2020   Influenza,inj,Quad PF,6+ Mos 09/02/2013,  08/26/2014, 08/19/2015, 08/23/2016   PFIZER(Purple Top)SARS-COV-2 Vaccination 12/18/2019, 01/08/2020, 09/01/2020, 05/11/2021, 10/12/2021   Pneumococcal Conjugate-13 11/29/2013   Pneumococcal Polysaccharide-23 12/06/2003, 12/05/2006   Td 12/05/2004   Zoster Recombinat (Shingrix) 04/12/2017, 06/19/2017   Zoster, Live 07/07/2008    TDAP status: Up to date  Flu Vaccine status: Up to date  Pneumococcal vaccine status: Up to date  Covid-19 vaccine status: Completed vaccines  Qualifies for Shingles Vaccine? Yes   Zostavax completed Yes   Shingrix Completed?: Yes  Screening Tests Health Maintenance  Topic Date Due   COVID-19 Vaccine (6 - Booster for Pfizer series) 12/07/2021   TETANUS/TDAP  06/04/2026   Pneumonia Vaccine 27+ Years old  Completed   INFLUENZA VACCINE  Completed   DEXA SCAN  Completed   Zoster Vaccines- Shingrix  Completed   HPV VACCINES  Aged Out   PAP SMEAR-Modifier  Discontinued    Health Maintenance  There are no preventive care reminders to display for this patient.  Colorectal cancer screening: No longer required.   Mammogram status: Completed 06/11/2021. Repeat every year  Bone Density status: Completed 09/01/2020. Results reflect: Bone density results: OSTEOPOROSIS. Repeat every 2 years.  Lung Cancer Screening: (Low Dose CT Chest recommended if Age 23-80 years, 30 pack-year currently smoking OR have quit w/in 15years.) does not qualify.   Lung Cancer Screening Referral: no  Additional Screening:  Hepatitis C Screening: does not qualify; Completed no  Vision Screening: Recommended annual ophthalmology exams for early detection of glaucoma and other disorders of the eye. Is the patient up to date with their annual eye exam?  Yes  Who is the provider or what is the name of the office in which the patient attends annual eye exams? [facility], OD. If pt is not established with a provider, would they like to be referred to a provider to establish care? No  .   Dental Screening: Recommended annual dental exams for proper oral hygiene  Community Resource Referral / Chronic Care Management: CRR required this visit?  No   CCM required this visit?  No      Plan:     I have personally reviewed and noted the following in the patients chart:   Medical and  social history Use of alcohol, tobacco or illicit drugs  Current medications and supplements including opioid prescriptions.  Functional ability and status Nutritional status Physical activity Advanced directives List of other physicians Hospitalizations, surgeries, and ER visits in previous 12 months Vitals Screenings to include cognitive, depression, and falls Referrals and appointments  In addition, I have reviewed and discussed with patient certain preventive protocols, quality metrics, and best practice recommendations. A written personalized care plan for preventive services as well as general preventive health recommendations were provided to patient.     Sheral Flow, LPN   53/12/402   Nurse Notes:  Patient denied any hearing difficulty.   No hearing aids. Patient wears corrective glasses/contacts.   Eye exam done annually by: Cleon Gustin, OD.

## 2021-11-19 NOTE — Progress Notes (Signed)
Patient ID: [name], female   DOB: 09-25-1941, 80 y.o.   MRN: [id number]        Chief Complaint: follow up HTN, HLD and hyperglycemia, ckd       HPI:  [name] is a 80 y.o. female here overall doing ok, Pt denies chest pain, increased sob or doe, wheezing, orthopnea, PND, increased LE swelling, palpitations, dizziness or syncope.   Pt denies polydipsia, polyuria, or new focal neuro s/s.   Pt denies fever, wt loss, night sweats, loss of appetite, or other constitutional symptoms.  Did have recent fall and right upper arm fx now immoblized x 6 mo in sling and plan to f/u with ortho.  No other new complaints       Wt Readings from Last 3 Encounters:  11/19/21 126 lb 9.6 oz (57.4 kg)  11/19/21 126 lb 9.6 oz (57.4 kg)  11/04/21 123 lb (55.8 kg)   BP Readings from Last 3 Encounters:  11/19/21 120/68  11/19/21 120/68  11/04/21 (!) 152/81         Past Medical History:  Diagnosis Date   Allergic rhinitis    Anxiety    Atrial septal aneurysm    Basal cell carcinoma    Bradycardia    Cataract    bil cataracts removed   Chronic combined systolic and diastolic CHF (congestive heart failure) (HCC)    CKD (chronic kidney disease), stage III (HCC)    Clostridium difficile infection    COPD (chronic obstructive pulmonary disease) ([place])    Depression 11/29/2013   DI (detrusor instability)    Esophageal stricture    Hemorrhoids    Hiatal hernia    History of kidney stones    HLD (hyperlipidemia)    HTN (hypertension)    Ischemic colon ([place])    a. 08/2016 - adm to  [facility] with a ruptured colon s/p right hemicolectomy with ileostomy formation for ischemic and necrotic colon.    Lung nodule    a. followed by pulmonology   Mild CAD    a. minor nonobstructive CAD 08/22/16.   Nephrolithiasis    hx   NICM (nonischemic cardiomyopathy) (HCC)    Orthostatic hypotension    Osteoarthritis    PAF (paroxysmal atrial fibrillation) ([place])    a. diagnosed 07/2016 s/p DCCV 08/01/16  with recurrence, managed with rate control for now.    Pneumonia 2013   PVC's (premature ventricular contractions)    Shingles since Nov 18, 2013   on right arm from shoulder to wrist   Status post dilation of esophageal narrowing    Stroke [facility]) 2001   stroke affected left leg   Syncope    a. 08/2016 felt due to orthostatic hypotension.   TIA (transient ischemic attack) last 2001   anticoagulation therapy on coumadin   Past Surgical History:  Procedure Laterality Date   ANTERIOR LATERAL LUMBAR FUSION 4 LEVELS Right 07/03/2015   Procedure: Right Lumbar one-two, Lumbar two-three, Lumbar three-four, Lumbar four-five Anterior lateral lumbar interbody fusion;  Surgeon: Erline Levine, MD;  Location: [place] NEURO ORS;  Service: Neurosurgery;  Laterality: Right;  Right L1-2 L2-3 L3-4 L4-5 Anterior lateral lumbar interbody fusion   BALLOON DILATION N/A 03/03/2014   Procedure: BALLOON DILATION;  Surgeon: Inda Castle, MD;  Location: [facility] ENDOSCOPY;  Service: Endoscopy;  Laterality: N/A;   BLADDER SUSPENSION     BREAST BIOPSY Right 07/03/2006   CARDIAC CATHETERIZATION N/A 08/22/2016   Procedure: Left Heart Cath and Coronary Angiography;  Surgeon: Peter M Martinique, MD;  Location: [place] CV LAB;  Service: Cardiovascular;  Laterality: N/A;   CARDIOVERSION N/A 08/01/2016   Procedure: CARDIOVERSION;  Surgeon: Jerline Pain, MD;  Location: [place];  Service: Cardiovascular;  Laterality: N/A;   CARDIOVERSION N/A 10/11/2016   Procedure: CARDIOVERSION;  Surgeon: Sueanne Margarita, MD;  Location: [place] ENDOSCOPY;  Service: Cardiovascular;  Laterality: N/A;   COLONOSCOPY     CYSTOSCOPY WITH RETROGRADE PYELOGRAM, URETEROSCOPY AND STENT PLACEMENT Left 11/04/2021   Procedure: CYSTOSCOPY WITH RETROGRADE PYELOGRAM, URETEROSCOPY  HOLMIUM LASER AND STENT PLACEMENT;  Surgeon: Ardis Hughs, MD;  Location: [facility];  Service: Urology;  Laterality: Left;   ESOPHAGOGASTRODUODENOSCOPY N/A 03/03/2014   Procedure:  ESOPHAGOGASTRODUODENOSCOPY (EGD);  Surgeon: Inda Castle, MD;  Location: [facility];  Service: Endoscopy;  Laterality: N/A;   EXCISION OF BASAL CELL CA  2011   FINGER SURGERY     GLUTEUS MINIMUS REPAIR Right 03/15/2016   Procedure: RIGHT HIP GLUTEUS MEDIUS TENDON REPAIR;  Surgeon: Paralee Cancel, MD;  Location: [facility];  Service: Orthopedics;  Laterality: Right;   ileostomy reversal     LUMBAR PERCUTANEOUS PEDICLE SCREW 4 LEVEL Right 07/03/2015   Procedure: Lumbar one-Sacral one Bilateral percutaneous pedicle screws;  Surgeon: Erline Levine, MD;  Location: [place] NEURO ORS;  Service: Neurosurgery;  Laterality: Right;  L1-S1 Bilateral percutaneous pedicle screws   RECTOCELE REPAIR  2008   A&P REPAIR with vaginal repair-DR. MCDIARMID   THUMB SURGERY Right 10-15 yrs ago   UPPER GASTROINTESTINAL ENDOSCOPY     VAGINAL HYSTERECTOMY  1983   NO BSO-DR. MABRY   VARICOSE VEIN SURGERY      reports that she quit smoking about 25 years ago. Her smoking use included cigarettes. She started smoking about 67 years ago. She has a 126.00 pack-year smoking history. She has never used smokeless tobacco. She reports that she does not drink alcohol and does not use drugs. family history includes Breast cancer in her maternal aunt, maternal aunt, maternal aunt, and mother; COPD in her mother; Emphysema in her mother; Uterine cancer in her mother. Allergies  Allergen Reactions   Clarithromycin Other (See Comments)    REACTION: possible rash but could have been legionarres dz with rash   Lipitor [Atorvastatin] Other (See Comments)    MUSCLE SPASMS   Simvastatin Other (See Comments)    Muscle and joint pain   Cardizem [Diltiazem Hcl] Hives   Contrast Media [Iodinated Diagnostic Agents]     FYI - during admission at [facility] 08/2016 patient developed hives, question related to cardizem or contrast dye - not clear   Rosuvastatin Calcium Other (See Comments)    fagitue and joint pain, "NO STATINS"   Current  Outpatient Medications on File Prior to Visit  Medication Sig Dispense Refill   albuterol (VENTOLIN HFA) 108 (90 Base) MCG/ACT inhaler Inhale 1-2 puffs into the lungs every 6 (six) hours as needed for wheezing or shortness of breath. 18 g 3   azelastine (ASTELIN) 0.1 % nasal spray Place 2 sprays into the nose daily. Use in each nostril as directed 90 mL 0   benzonatate (TESSALON) 200 MG capsule Take 1 capsule (200 mg total) by mouth 3 (three) times daily as needed for cough. 30 capsule 1   Biotin 5 MG CAPS Take 5 mg by mouth daily.     buPROPion (WELLBUTRIN XL) 300 MG 24 hr tablet Take 1 tablet (300 mg total) by mouth daily. 90 tablet 0   Calcium Carbonate-Vitamin  D (CALCIUM + D PO) Take 1 tablet by mouth 2 (two) times daily.     cyclobenzaprine (FLEXERIL) 5 MG tablet Take 1 tablet (5 mg total) by mouth 3 (three) times daily as needed for muscle spasms. 270 tablet 1   diltiazem 2 % GEL Apply 1 application topically 5 (five) times daily. (Patient taking differently: Apply 1 application topically 5 (five) times daily. To be completed prior to surgery on 11/04/2021 per pt) 30 g 1   ezetimibe (ZETIA) 10 MG tablet Take 1 tablet (10 mg total) by mouth daily. 90 tablet 0   fluticasone (FLONASE) 50 MCG/ACT nasal spray Place 1 spray into both nostrils 2 (two) times daily. 48 g 1   Fluticasone-Umeclidin-Vilant (TRELEGY ELLIPTA) 100-62.5-25 MCG/ACT AEPB Inhale 1 puff into the lungs daily. 180 each 3   Fluticasone-Umeclidin-Vilant (TRELEGY ELLIPTA) 100-62.5-25 MCG/INH AEPB Inhale 1 puff into the lungs daily. I inhalation once daily 180 each 1   furosemide (LASIX) 20 MG tablet Take 1 tablet (20 mg total) by mouth daily as needed (swelling). (Patient taking differently: Take 20 mg by mouth daily.) 90 tablet 1   gabapentin (NEURONTIN) 300 MG capsule 1 tab by mouth in AM, 1 po at midday, then 2 by mouth at bedtime for sleep and pain (Patient taking differently: Take 300-600 mg by mouth See admin instructions. Take 300  mg by mouth in the morning and 600 mg at night) 360 capsule 1   guaiFENesin (MUCINEX) 600 MG 12 hr tablet Take 1,200 mg by mouth 2 (two) times daily as needed for cough or to loosen phlegm.     hydroxychloroquine (PLAQUENIL) 200 MG tablet Take 400 mg by mouth at bedtime.     ipratropium (ATROVENT) 0.03 % nasal spray Place 2 sprays into both nostrils 3 (three) times daily as needed for rhinitis. 90 mL 1   levothyroxine (SYNTHROID) 25 MCG tablet Take 1 tablet (25 mcg total) by mouth daily before breakfast. Annual appt due in Nov must see provider for future refills 90 tablet 3   Lidocaine HCl 2 % CREA Apply 1 application topically daily as needed (for shingles flares).     loratadine (CLARITIN) 10 MG tablet Take 10 mg by mouth daily.     Melatonin 10 MG TABS Take 10 mg by mouth at bedtime.     metoprolol succinate (TOPROL-XL) 100 MG 24 hr tablet Take 1 tablet (100 mg total) by mouth daily. Take with or immediately following a meal. (Patient taking differently: Take 100 mg by mouth daily. Take with or immediately following a meal. Pt takes in the pm) 90 tablet 3   mirabegron ER (MYRBETRIQ) 50 MG TB24 tablet Take 50 mg by mouth at bedtime.     montelukast (SINGULAIR) 10 MG tablet Take 1 tablet (10 mg total) by mouth daily. Annual appt due in Nov must see provider for future refills 90 tablet 1   Multiple Vitamin (MULTIVITAMIN) tablet Take 2 tablets by mouth daily.     omeprazole (PRILOSEC) 40 MG capsule Take 1 capsule (40 mg total) by mouth daily. (Patient taking differently: Take 40 mg by mouth every evening.) 90 capsule 3   phenazopyridine (PYRIDIUM) 200 MG tablet Take 1 tablet (200 mg total) by mouth 3 (three) times daily as needed for pain. 10 tablet 0   Probiotic Product (PROBIOTIC DAILY PO) Take 1 capsule by mouth in the morning and at bedtime.     traMADol (ULTRAM) 50 MG tablet Take 1-2 tablets (50-100 mg total) by mouth every  6 (six) hours as needed for moderate pain. 15 tablet 0   traZODone  (DESYREL) 50 MG tablet Take 1 tablet (50 mg total) by mouth at bedtime and may repeat dose one time if needed. (Patient taking differently: Take 100 mg by mouth at bedtime.) 180 tablet 1   colestipol (COLESTID) 1 g tablet Take 2 tablets (2 g total) by mouth 2 (two) times daily. (Patient taking differently: Take 1 g by mouth 2 (two) times daily.) 360 tablet 3   diphenhydrAMINE (BENADRYL) 50 MG tablet Take 1 tablet (50 mg total) by mouth once for 1 dose. Pt to take 50 mg of Benadryl pm 07/13/21 @ 9:20 AM. Please call [phone] with any questions. (Patient not taking: Reported on 10/25/2021) 1 tablet 0   No current facility-administered medications on file prior to visit.        ROS:  All others reviewed and negative.  Objective        PE:  BP 120/68 (BP Location: Left Arm)    Pulse (!) 104    Temp 97.8 F (36.6 C) (Oral)    Ht 5\' 4"  (1.626 m)    Wt 126 lb 9.6 oz (57.4 kg)    SpO2 95%    BMI 21.73 kg/m                 Constitutional: Pt appears in NAD               HENT: Head: NCAT.                Right Ear: External ear normal.                 Left Ear: External ear normal.                Eyes: . Pupils are equal, round, and reactive to light. Conjunctivae and EOM are normal               Nose: without d/c or deformity               Neck: Neck supple. Gross normal ROM               Cardiovascular: Normal rate and regular rhythm.                 Pulmonary/Chest: Effort normal and breath sounds without rales or wheezing.                              Neurological: Pt is alert. At baseline orientation, motor grossly intact               Skin: Skin is warm. No rashes, no other new lesions, LE edema - none, right arm immobilized               Psychiatric: Pt behavior is normal without agitation   Micro: none  Cardiac tracings I have personally interpreted today:  none  Pertinent Radiological findings (summarize): none   Lab Results  Component Value Date   WBC 4.9 10/26/2021   HGB 12.0  10/26/2021   HCT 37.9 10/26/2021   PLT 164 10/26/2021   GLUCOSE 95 10/26/2021   CHOL 143 05/13/2021   TRIG 141.0 05/13/2021   HDL 59.20 05/13/2021   LDLDIRECT 133.0 10/17/2018   LDLCALC 56 05/13/2021   ALT 15 05/13/2021   AST 22 05/13/2021   NA 139 10/26/2021   K 4.2 10/26/2021  CL 105 10/26/2021   CREATININE 1.17 (H) 10/26/2021   BUN 18 10/26/2021   CO2 28 10/26/2021   TSH 2.10 05/13/2021   INR 1.21 08/17/2016   HGBA1C 5.5 05/13/2021   Assessment/Plan:  [name] is a 80 y.o. White or Caucasian [1] female with  has a past medical history of Allergic rhinitis, Anxiety, Atrial septal aneurysm, Basal cell carcinoma, Bradycardia, Cataract, Chronic combined systolic and diastolic CHF (congestive heart failure) ([place]), CKD (chronic kidney disease), stage III ([place]), Clostridium difficile infection, COPD (chronic obstructive pulmonary disease) ([place]), Depression (11/29/2013), DI (detrusor instability), Esophageal stricture, Hemorrhoids, Hiatal hernia, History of kidney stones, HLD (hyperlipidemia), HTN (hypertension), Ischemic colon ([place]), Lung nodule, Mild CAD, Nephrolithiasis, NICM (nonischemic cardiomyopathy) ([place]), Orthostatic hypotension, Osteoarthritis, PAF (paroxysmal atrial fibrillation) ([place]), Pneumonia (2013), PVC's (premature ventricular contractions), Shingles (since Nov 18, 2013), Status post dilation of esophageal narrowing, Stroke ([place]) (2001), Syncope, and TIA (transient ischemic attack) (last 2001).  Hyperglycemia Lab Results  Component Value Date   HGBA1C 5.5 05/13/2021   Stable, pt to continue current medical treatment  - diet   HLD (hyperlipidemia) Lab Results  Component Value Date   LDLCALC 56 05/13/2021   Stable, pt to continue current statin  - diet   CKD (chronic kidney disease) stage 3, GFR 30-59 ml/min (HCC) Lab Results  Component Value Date   CREATININE 1.17 (H) 10/26/2021   Stable overall, cont to avoid nephrotoxins   HTN (hypertension) . BP  Readings from Last 3 Encounters:  11/19/21 120/68  11/19/21 120/68  11/04/21 (!) 152/81   Stable, pt to continue medical treatment  - diet  Followup: Return in about 6 months (around 05/20/2022).  Cathlean Cower, MD 11/20/2021 3:48 PM [facility]

## 2021-11-20 ENCOUNTER — Encounter: Payer: Self-pay | Admitting: Internal Medicine

## 2021-11-20 NOTE — Patient Instructions (Signed)
Please continue all other medications as before, and refills have been done if requested.  Please have the pharmacy call with any other refills you may need.  Please continue your efforts at being more active, low cholesterol diet, and weight control.  Please keep your appointments with your specialists as you may have planned  Please make an Appointment to return in 6 months, or sooner if needed,

## 2021-11-20 NOTE — Assessment & Plan Note (Signed)
Lab Results  Component Value Date   CREATININE 1.17 (H) 10/26/2021   Stable overall, cont to avoid nephrotoxins

## 2021-11-20 NOTE — Assessment & Plan Note (Signed)
Lab Results  Component Value Date   HGBA1C 5.5 05/13/2021   Stable, pt to continue current medical treatment  - diet

## 2021-11-20 NOTE — Assessment & Plan Note (Signed)
Lab Results  Component Value Date   LDLCALC 56 05/13/2021   Stable, pt to continue current statin  - diet

## 2021-11-20 NOTE — Assessment & Plan Note (Signed)
. °  BP Readings from Last 3 Encounters:  11/19/21 120/68  11/19/21 120/68  11/04/21 (!) 152/81   Stable, pt to continue medical treatment  - diet

## 2021-11-22 DIAGNOSIS — N1832 Chronic kidney disease, stage 3b: Secondary | ICD-10-CM | POA: Diagnosis not present

## 2021-11-22 DIAGNOSIS — N189 Chronic kidney disease, unspecified: Secondary | ICD-10-CM | POA: Diagnosis not present

## 2021-11-26 ENCOUNTER — Other Ambulatory Visit: Payer: Self-pay

## 2021-11-26 ENCOUNTER — Ambulatory Visit (HOSPITAL_COMMUNITY): Payer: Medicare Other | Attending: Cardiology

## 2021-11-26 DIAGNOSIS — R011 Cardiac murmur, unspecified: Secondary | ICD-10-CM | POA: Insufficient documentation

## 2021-11-27 LAB — ECHOCARDIOGRAM COMPLETE
AR max vel: 1.12 cm2
AV Area VTI: 1.03 cm2
AV Area mean vel: 1.08 cm2
AV Mean grad: 10.5 mmHg
AV Peak grad: 19.1 mmHg
Ao pk vel: 2.19 m/s
Area-P 1/2: 3.65 cm2
MV M vel: 5.71 m/s
MV Peak grad: 130.4 mmHg
MV VTI: 1.07 cm2
S' Lateral: 4.2 cm

## 2021-12-03 ENCOUNTER — Encounter: Payer: Self-pay | Admitting: Internal Medicine

## 2021-12-03 ENCOUNTER — Telehealth: Payer: Self-pay | Admitting: Physician Assistant

## 2021-12-03 NOTE — Telephone Encounter (Signed)
Patient calling back in regards to [employer]. She says she was called about her echo results and was supposed to receive a call yesterday to explain what was wrong with the results and what procedure she will be having.

## 2021-12-07 ENCOUNTER — Telehealth: Payer: Self-pay | Admitting: *Deleted

## 2021-12-07 NOTE — Telephone Encounter (Signed)
Lvm on cell and home for patient to call clinic back at  direct number [phone] to set up TEE and to confirm no dates have changed.

## 2021-12-08 ENCOUNTER — Telehealth: Payer: Self-pay | Admitting: *Deleted

## 2021-12-08 NOTE — Telephone Encounter (Signed)
Spoke with patient and aware of unavailable and available dates. Patient will be contacted back with date of TEE the week of 12-20-21 as preferred Friday when return back to work/

## 2021-12-09 ENCOUNTER — Encounter: Payer: Self-pay | Admitting: Nurse Practitioner

## 2021-12-09 ENCOUNTER — Ambulatory Visit (INDEPENDENT_AMBULATORY_CARE_PROVIDER_SITE_OTHER): Payer: Medicare Other | Admitting: Nurse Practitioner

## 2021-12-09 ENCOUNTER — Other Ambulatory Visit: Payer: Self-pay

## 2021-12-09 ENCOUNTER — Ambulatory Visit (INDEPENDENT_AMBULATORY_CARE_PROVIDER_SITE_OTHER): Payer: Medicare Other

## 2021-12-09 VITALS — BP 102/56 | HR 100 | Temp 97.9°F | Ht 63.0 in | Wt 126.0 lb

## 2021-12-09 DIAGNOSIS — R0602 Shortness of breath: Secondary | ICD-10-CM

## 2021-12-09 DIAGNOSIS — I5040 Unspecified combined systolic (congestive) and diastolic (congestive) heart failure: Secondary | ICD-10-CM

## 2021-12-09 DIAGNOSIS — R059 Cough, unspecified: Secondary | ICD-10-CM | POA: Diagnosis not present

## 2021-12-09 DIAGNOSIS — J441 Chronic obstructive pulmonary disease with (acute) exacerbation: Secondary | ICD-10-CM

## 2021-12-09 DIAGNOSIS — R7989 Other specified abnormal findings of blood chemistry: Secondary | ICD-10-CM | POA: Diagnosis not present

## 2021-12-09 DIAGNOSIS — R0600 Dyspnea, unspecified: Secondary | ICD-10-CM | POA: Insufficient documentation

## 2021-12-09 DIAGNOSIS — J90 Pleural effusion, not elsewhere classified: Secondary | ICD-10-CM | POA: Diagnosis not present

## 2021-12-09 LAB — D-DIMER, QUANTITATIVE: D-Dimer, Quant: 1.02 mcg/mL FEU — ABNORMAL HIGH (ref <0.50)

## 2021-12-09 MED ORDER — AMOXICILLIN-POT CLAVULANATE 875-125 MG PO TABS: 1.0000 | ORAL_TABLET | Freq: Two times a day (BID) | ORAL | 0 refills | Status: DC

## 2021-12-09 MED ORDER — DOXYCYCLINE HYCLATE 100 MG PO TABS: 100.0000 mg | ORAL_TABLET | Freq: Two times a day (BID) | ORAL | 0 refills | Status: DC

## 2021-12-09 MED ORDER — PREDNISONE 10 MG PO TABS: ORAL_TABLET | ORAL | 0 refills | Status: DC

## 2021-12-09 MED ORDER — IPRATROPIUM-ALBUTEROL 0.5-2.5 (3) MG/3ML IN SOLN: 3.0000 mL | Freq: Four times a day (QID) | RESPIRATORY_TRACT | 3 refills | Status: AC | PRN

## 2021-12-09 MED ORDER — METHYLPREDNISOLONE ACETATE 80 MG/ML IJ SUSP: 80.0000 mg | Freq: Once | INTRAMUSCULAR | Status: AC

## 2021-12-09 MED ORDER — IPRATROPIUM-ALBUTEROL 0.5-2.5 (3) MG/3ML IN SOLN: 3.0000 mL | Freq: Once | RESPIRATORY_TRACT | Status: AC

## 2021-12-09 MED ORDER — PROMETHAZINE-DM 6.25-15 MG/5ML PO SYRP: 2.5000 mL | ORAL_SOLUTION | Freq: Four times a day (QID) | ORAL | 0 refills | Status: AC | PRN

## 2021-12-09 NOTE — Progress Notes (Signed)
Please notify patient that her chest x ray showed mild, diffuse bilateral pulmonary opacities and small bilateral pleural effusions. I suspect likely related to her heart failure. I will await her lab results and then we will likely increase her lasix for a few days. I am also going to send a note to cardiology. She could have a possible pneumonia as well, so I am going to change her to Augmentin 875 Twice daily for 7 days from the doxycyline. Thanks!

## 2021-12-09 NOTE — Patient Instructions (Addendum)
Continue Trelegy 100 inhaler 1 puff daily. Brush tongue and rinse mouth afterwards -Continue Albuterol inhaler 1-2 puffs every 6 hours as needed for shortness of breath or wheezing. Notify if symptoms persist despite rescue inhaler/neb use. -Continue Astelin nasal spray 2 sprays each nostril daily  -Continue tessalon perles 200 mg Three times a day as needed for cough -Continue mucinex 600 mg Twice daily  -Continue atrovent nasal spray 2 sprays each nostril three times a day as needed for runny nose or nasal congesion -Continue claritin 10 mg daily -Continue Singulair 10 mg At bedtime  -Continue omeprazole 40 mg daily   -Depo 80 mg inj given today. Duoneb nebulizer treatment administered today  -Prednisone taper pack. Start tomorrow. 4 tabs for 2 days, then 3 tabs for 2 days, 2 tabs for 2 days, then 1 tab for 2 days, then stop. Take in AM with food. -Promethazine/DM syrup 2.5 mL every 6 hours as needed for cough. May cause drowsiness. Do not drive or operate heavy machinery while taking if this occurs. Monitor your mobility and be aware that you are at an increased risk for falls while taking this if you become drowsy. -Augmentin 875 Twice daily for 7 days. Notify immediately of any rash, itching, hives, or swelling, or seek emergency care.Finish your antibiotics in their entirety. Do not stop just because symptoms improve. Take with food to reduce GI upset.   Chest x ray today. We will notify you of an abnormal results.   Labs today - d dimer and BMET  Monitor oxygen level at home for goal >88-90%. Seek emergency care if persistently below this.   Follow up in one week with Dr. Valeta Harms or Alanson Aly. If symptoms do not improve or worsen, please contact office for sooner follow up or seek emergency care.

## 2021-12-09 NOTE — Assessment & Plan Note (Addendum)
Previously stable. Worsening SOB, wheeze, and cough. Likely multifactorial related to COPD exacerbation and worsening HF. Clinically appears stable. Depo 80 mg inj today. Start prednisone taper tomorrow.  Duoneb in office with improvement in aeration. Rx sent with referral to DME for neb machine. CXR today concerning for likely pulmonary edema given worsening heart function; possible infectious etiology. Augmentin 875 Twice daily for 7 days. Will await lab results and increase lasix.   Patient Instructions  Continue Trelegy 100 inhaler 1 puff daily. Brush tongue and rinse mouth afterwards -Continue Albuterol inhaler 1-2 puffs every 6 hours as needed for shortness of breath or wheezing. Notify if symptoms persist despite rescue inhaler/neb use. -Continue Astelin nasal spray 2 sprays each nostril daily  -Continue tessalon perles 200 mg Three times a day as needed for cough -Continue mucinex 600 mg Twice daily  -Continue atrovent nasal spray 2 sprays each nostril three times a day as needed for runny nose or nasal congesion -Continue claritin 10 mg daily -Continue Singulair 10 mg At bedtime  -Continue omeprazole 40 mg daily   -Depo 80 mg inj given today. Duoneb nebulizer treatment administered today  -Prednisone taper pack. Start tomorrow. 4 tabs for 2 days, then 3 tabs for 2 days, 2 tabs for 2 days, then 1 tab for 2 days, then stop. Take in AM with food. -Promethazine/DM syrup 2.5 mL every 6 hours as needed for cough. May cause drowsiness. Do not drive or operate heavy machinery while taking if this occurs. Monitor your mobility and be aware that you are at an increased risk for falls while taking this if you become drowsy. -Augmentin 875 Twice daily for 7 days. Notify immediately of any rash, itching, hives, or swelling, or seek emergency care.Finish your antibiotics in their entirety. Do not stop just because symptoms improve. Take with food to reduce GI upset.   Chest x ray today. We will notify  you of an abnormal results.   Labs today - d dimer and BMET  Monitor oxygen level at home for goal >88-90%. Seek emergency care if persistently below this.   Follow up in one week with Dr. Valeta Harms or Alanson Aly. If symptoms do not improve or worsen, please contact office for sooner follow up or seek emergency care.

## 2021-12-09 NOTE — Progress Notes (Addendum)
@Patient  ID: [name], female    DOB: 1941-04-09, 81 y.o.   MRN: [id number]  Chief Complaint  Patient presents with   Follow-up    Patient says her cough is getting worse and she can't cough up anything.     Referring provider: Biagio Borg, MD  HPI: 81 year old female, former smoker followed for severe COPD and chronic rhinitis.  Known lung nodule followed for serial CT 2015-2018 was stable, consistent with benign etiology.  She is a former patient of Dr. Anastasia Pall, now followed by Dr. Valeta Harms, and was last seen in office on 09/16/2021.  Past medical history significant for chronic atrial fibrillation, history of stroke, GERD, CKD stage III, insomnia, depression with anxiety.  TEST/EVENTS:  10/07/2019 super D chest without contrast: Atherosclerosis.  Moderate sized hiatal hernia.  Moderate paraseptal and centrilobular emphysema with diffuse bronchial wall thickening.  Scarring and volume loss within the lingula and medial left lung base.  Cluster of tree-in-bud nodules within the inferior lingula likely reflect either mucoid impaction within dilated terminal bronchioles versus sequelae of inflammatory infectious bronchiolitis.  7 mm nodule within the periphery of the right lower lobe is stable.  Multiple new, small nonspecific lung nodules identified, largest of which is 6 mm. 11/26/2021 echocardiogram: LVEF 25 to 30%.  Global hypokinesis of LV.  G2 DD.  RV function and size normal.  Normal PA pressure.  Moderate to severe mitral valve regurgitation.  Mild mitral stenosis.  Moderate aortic valve stenosis.  09/16/2021: OV with Dr. Valeta Harms.  Ordered repeat CT scan of the chest for further evaluation of groundglass subsolid lesion seen on CT in 2018.  See results above-Will need CT follow-up in 3 to 6 months. COPD well-controlled -continue Trelegy and as needed albuterol.  12/09/2021: Today-acute visit Patient reports today with husband for reported persistent cough and shortness of breath. Her  symptoms started a week ago and have not improved. Her cough is primarily non-productive but she is occasional able to cough up green sputum and feels as though her chest has phlegm in it. She has experienced an increase in her shortness of breath upon exertion and is experiencing PND, which is new. She has also had some associated wheezing and chest tightness. She denies URI symptoms, fever, or recent sick exposures. She denies orthopnea, chest pain or lower extremity swelling. She did have a fall right before Christmas and subsequent right shoulder fracture which has been managed non-operatively so far. She denies any swelling or redness in this extremity. She did have a recent echo, which showed worsening heart function with a EF of 25-30% and worsening MV regurgitation. She is awaiting a TEE and is currently followed by cardiology. She currently takes lasix 20 mg daily. She continues on Trelegy daily and reports that her breathing was stable prior to this. She has been using her rescue inhaler a few times a day now. She continues on claritin, singulair, Astelin nasal spray, Atrovent nasal spray, flonase nasal spray daily. She has been taking mucinex 600 mg Twice daily and tessalon perles with little relief in her cough.   Allergies  Allergen Reactions   Clarithromycin Other (See Comments)    REACTION: possible rash but could have been legionarres dz with rash   Lipitor [Atorvastatin] Other (See Comments)    MUSCLE SPASMS   Simvastatin Other (See Comments)    Muscle and joint pain   Cardizem [Diltiazem Hcl] Hives   Contrast Media [Iodinated Contrast Media]     FYI - during  admission at [facility] 08/2016 patient developed hives, question related to cardizem or contrast dye - not clear   Rosuvastatin Calcium Other (See Comments)    fagitue and joint pain, "NO STATINS"    Immunization History  Administered Date(s) Administered   Fluad Quad(high Dose 65+) 08/08/2019, 08/17/2020, 09/16/2021    Influenza Split 09/11/2012   Influenza Whole 09/15/2006, 08/04/2010, 08/11/2011   Influenza, High Dose Seasonal PF 08/22/2017, 09/05/2018, 08/19/2020   Influenza,inj,Quad PF,6+ Mos 09/02/2013, 08/26/2014, 08/19/2015, 08/23/2016   PFIZER(Purple Top)SARS-COV-2 Vaccination 12/18/2019, 01/08/2020, 09/01/2020, 05/11/2021, 10/12/2021   Pneumococcal Conjugate-13 11/29/2013   Pneumococcal Polysaccharide-23 12/06/2003, 12/05/2006   Td 12/05/2004   Zoster Recombinat (Shingrix) 04/12/2017, 06/19/2017   Zoster, Live 07/07/2008    Past Medical History:  Diagnosis Date   Allergic rhinitis    Anxiety    Atrial septal aneurysm    Basal cell carcinoma    Bradycardia    Cataract    bil cataracts removed   Chronic combined systolic and diastolic CHF (congestive heart failure) (HCC)    CKD (chronic kidney disease), stage III (HCC)    Clostridium difficile infection    COPD (chronic obstructive pulmonary disease) ([place])    Depression 11/29/2013   DI (detrusor instability)    Esophageal stricture    Hemorrhoids    Hiatal hernia    History of kidney stones    HLD (hyperlipidemia)    HTN (hypertension)    Ischemic colon ([place])    a. 08/2016 - adm to  [facility] with a ruptured colon s/p right hemicolectomy with ileostomy formation for ischemic and necrotic colon.    Lung nodule    a. followed by pulmonology   Mild CAD    a. minor nonobstructive CAD 08/22/16.   Nephrolithiasis    hx   NICM (nonischemic cardiomyopathy) (HCC)    Orthostatic hypotension    Osteoarthritis    PAF (paroxysmal atrial fibrillation) ([place])    a. diagnosed 07/2016 s/p DCCV 08/01/16 with recurrence, managed with rate control for now.    Pneumonia 2013   PVC's (premature ventricular contractions)    Shingles since Nov 18, 2013   on right arm from shoulder to wrist   Status post dilation of esophageal narrowing    Stroke [facility]) 2001   stroke affected left leg   Syncope    a. 08/2016 felt due to orthostatic  hypotension.   TIA (transient ischemic attack) last 2001   anticoagulation therapy on coumadin    Tobacco History: Social History   Tobacco Use  Smoking Status Former   Packs/day: 3.00   Years: 42.00   Pack years: 126.00   Types: Cigarettes   Start date: 08/04/1954   Quit date: 12/06/1995   Years since quitting: 26.0  Smokeless Tobacco Never   Counseling given: Not Answered   Outpatient Medications Prior to Visit  Medication Sig Dispense Refill   albuterol (VENTOLIN HFA) 108 (90 Base) MCG/ACT inhaler Inhale 1-2 puffs into the lungs every 6 (six) hours as needed for wheezing or shortness of breath. 18 g 3   azelastine (ASTELIN) 0.1 % nasal spray Place 2 sprays into the nose daily. Use in each nostril as directed 90 mL 0   benzonatate (TESSALON) 200 MG capsule Take 1 capsule (200 mg total) by mouth 3 (three) times daily as needed for cough. 30 capsule 1   Biotin 5 MG CAPS Take 5 mg by mouth daily.     buPROPion (WELLBUTRIN XL) 300 MG 24 hr tablet Take 1 tablet (300  mg total) by mouth daily. 90 tablet 0   Calcium Carbonate-Vitamin D (CALCIUM + D PO) Take 1 tablet by mouth 2 (two) times daily.     cyclobenzaprine (FLEXERIL) 5 MG tablet Take 1 tablet (5 mg total) by mouth 3 (three) times daily as needed for muscle spasms. 270 tablet 1   ezetimibe (ZETIA) 10 MG tablet Take 1 tablet (10 mg total) by mouth daily. 90 tablet 0   fluticasone (FLONASE) 50 MCG/ACT nasal spray Place 1 spray into both nostrils 2 (two) times daily. 48 g 1   Fluticasone-Umeclidin-Vilant (TRELEGY ELLIPTA) 100-62.5-25 MCG/ACT AEPB Inhale 1 puff into the lungs daily. 180 each 3   Fluticasone-Umeclidin-Vilant (TRELEGY ELLIPTA) 100-62.5-25 MCG/INH AEPB Inhale 1 puff into the lungs daily. I inhalation once daily 180 each 1   furosemide (LASIX) 20 MG tablet Take 1 tablet (20 mg total) by mouth daily as needed (swelling). (Patient taking differently: Take 20 mg by mouth daily.) 90 tablet 1   gabapentin (NEURONTIN) 300 MG  capsule 1 tab by mouth in AM, 1 po at midday, then 2 by mouth at bedtime for sleep and pain (Patient taking differently: Take 300-600 mg by mouth See admin instructions. Take 300 mg by mouth in the morning and 600 mg at night) 360 capsule 1   guaiFENesin (MUCINEX) 600 MG 12 hr tablet Take 1,200 mg by mouth 2 (two) times daily as needed for cough or to loosen phlegm.     hydroxychloroquine (PLAQUENIL) 200 MG tablet Take 400 mg by mouth at bedtime.     ipratropium (ATROVENT) 0.03 % nasal spray Place 2 sprays into both nostrils 3 (three) times daily as needed for rhinitis. 90 mL 1   levothyroxine (SYNTHROID) 25 MCG tablet Take 1 tablet (25 mcg total) by mouth daily before breakfast. Annual appt due in Nov must see provider for future refills 90 tablet 3   Lidocaine HCl 2 % CREA Apply 1 application topically daily as needed (for shingles flares).     loratadine (CLARITIN) 10 MG tablet Take 10 mg by mouth daily.     Melatonin 10 MG TABS Take 10 mg by mouth at bedtime.     metoprolol succinate (TOPROL-XL) 100 MG 24 hr tablet Take 1 tablet (100 mg total) by mouth daily. Take with or immediately following a meal. (Patient taking differently: Take 100 mg by mouth daily. Take with or immediately following a meal. Pt takes in the pm) 90 tablet 3   mirabegron ER (MYRBETRIQ) 50 MG TB24 tablet Take 50 mg by mouth at bedtime.     montelukast (SINGULAIR) 10 MG tablet Take 1 tablet (10 mg total) by mouth daily. Annual appt due in Nov must see provider for future refills 90 tablet 1   Multiple Vitamin (MULTIVITAMIN) tablet Take 2 tablets by mouth daily.     omeprazole (PRILOSEC) 40 MG capsule Take 1 capsule (40 mg total) by mouth daily. (Patient taking differently: Take 40 mg by mouth every evening.) 90 capsule 3   Probiotic Product (PROBIOTIC DAILY PO) Take 1 capsule by mouth in the morning and at bedtime.     traZODone (DESYREL) 50 MG tablet Take 1 tablet (50 mg total) by mouth at bedtime and may repeat dose one time  if needed. (Patient taking differently: Take 100 mg by mouth at bedtime.) 180 tablet 1   colestipol (COLESTID) 1 g tablet Take 2 tablets (2 g total) by mouth 2 (two) times daily. (Patient taking differently: Take 1 g by mouth 2 (two)  times daily.) 360 tablet 3   diphenhydrAMINE (BENADRYL) 50 MG tablet Take 1 tablet (50 mg total) by mouth once for 1 dose. Pt to take 50 mg of Benadryl pm 07/13/21 @ 9:20 AM. Please call [phone] with any questions. (Patient not taking: Reported on 10/25/2021) 1 tablet 0   diltiazem 2 % GEL Apply 1 application topically 5 (five) times daily. (Patient taking differently: Apply 1 application topically 5 (five) times daily. To be completed prior to surgery on 11/04/2021 per pt) 30 g 1   phenazopyridine (PYRIDIUM) 200 MG tablet Take 1 tablet (200 mg total) by mouth 3 (three) times daily as needed for pain. 10 tablet 0   traMADol (ULTRAM) 50 MG tablet Take 1-2 tablets (50-100 mg total) by mouth every 6 (six) hours as needed for moderate pain. 15 tablet 0   No facility-administered medications prior to visit.     Review of Systems:   Constitutional: No weight loss or gain, night sweats, fevers, chills. +fatigue HEENT: No headaches, difficulty swallowing, tooth/dental problems, or sore throat. No sneezing, itching, ear ache, nasal congestion, or post nasal drip CV:  +PND. No chest pain, orthopnea, swelling in lower extremities, anasarca, dizziness, palpitations, syncope Resp: +shortness of breath with exertion; cough (primarily non-productive but occasionally productive green sputum); wheezing. No hemoptysis. No chest wall deformity GI:  No heartburn, indigestion, abdominal pain, nausea, vomiting, diarrhea, change in bowel habits, loss of appetite, bloody stools.  GU: No dysuria, change in color of urine, urgency or frequency.  No flank pain, no hematuria  Skin: No rash, lesions, ulcerations MSK:  No joint pain or swelling.  No decreased range of motion.  No back  pain. Neuro: No dizziness or lightheadedness.  Psych: No depression or anxiety. Mood stable.     Physical Exam:  BP (!) 102/56 (BP Location: Left Arm, Patient Position: Sitting, Cuff Size: Normal)    Pulse 100    Temp 97.9 F (36.6 C) (Oral)    Ht 5\' 3"  (1.6 m)    Wt 126 lb (57.2 kg)    SpO2 97%    BMI 22.32 kg/m   GEN: Pleasant, interactive, chronically-ill appearing; in no acute distress. HEENT:  Normocephalic and atraumatic. EACs patent bilaterally. TM pearly gray with present light reflex bilaterally. PERRLA. Sclera white. Nasal turbinates pink, moist and patent bilaterally. No rhinorrhea present. Oropharynx pink and moist, without exudate or edema. No lesions, ulcerations, or postnasal drip.  NECK:  Supple w/ fair ROM. No JVD present. Normal carotid impulses w/o bruits. Thyroid symmetrical with no goiter or nodules palpated. No lymphadenopathy.   CV: RRR, no m/r/g, no peripheral edema. Pulses intact, +2 bilaterally. No cyanosis, pallor or clubbing. PULMONARY:  Unlabored, regular breathing. Scattered rhonchi and wheeze bilaterally A&P. No accessory muscle use. No dullness to percussion. GI: BS present and normoactive. Soft, non-tender to palpation. No organomegaly or masses detected. No CVA tenderness. MSK: No erythema, warmth or tenderness. Cap refil <2 sec all extrem. No deformities or joint swelling noted.  Neuro: A/Ox3. No focal deficits noted.   Skin: Warm, no lesions or rashe Psych: Normal affect and behavior. Judgement and thought content appropriate.     Lab Results:  CBC    Component Value Date/Time   WBC 4.9 10/26/2021 1018   RBC 4.49 10/26/2021 1018   HGB 12.0 10/26/2021 1018   HCT 37.9 10/26/2021 1018   PLT 164 10/26/2021 1018   MCV 84.4 10/26/2021 1018   MCH 26.7 10/26/2021 1018   MCHC 31.7 10/26/2021 1018  RDW 13.6 10/26/2021 1018   LYMPHSABS 1.4 05/13/2021 1025   MONOABS 0.9 05/13/2021 1025   EOSABS 0.1 05/13/2021 1025   BASOSABS 0.1 05/13/2021 1025     BMET    Component Value Date/Time   NA 140 12/09/2021 1626   K 4.1 12/09/2021 1626   CL 108 12/09/2021 1626   CO2 26 12/09/2021 1626   GLUCOSE 77 12/09/2021 1626   BUN 12 12/09/2021 1626   CREATININE 1.17 12/09/2021 1626   CREATININE 1.15 (H) 09/29/2016 0744   CALCIUM 8.8 12/09/2021 1626   GFRNONAA 47 (L) 10/26/2021 1018   GFRAA >60 08/22/2016 0513    BNP    Component Value Date/Time   BNP 343.5 (H) 08/17/2016 2151     Imaging:  12/09/2021: CXR reviewed by me. Mild, diffuse bilateral pulmonary opacity and small bilateral pleural effusions with associate atelectasis or consolidation. Concerning for pulmonary edema given heart failure. Possible underlying infectious process as well.  DG Chest 2 View  Result Date: 12/09/2021 CLINICAL DATA:  Shortness of breath, cough EXAM: CHEST - 2 VIEW COMPARISON:  08/13/2021 FINDINGS: The heart size and mediastinal contours are within normal limits. Mild, diffuse bilateral pulmonary opacity and small bilateral pleural effusions with associated atelectasis or consolidation. Disc degenerative disease of the thoracic spine. Partially imaged thoracolumbar fusion. IMPRESSION: Mild, diffuse bilateral pulmonary opacity and small bilateral pleural effusions with associated atelectasis or consolidation. Findings may reflect edema or infection. No focal airspace opacity. Electronically Signed   By: Delanna Ahmadi M.D.   On: 12/09/2021 16:43   NM Pulmonary Perfusion  Result Date: 12/10/2021 CLINICAL DATA:  Shortness of breath on exertion. History of COPD with recent bronchitis flare. Elevated D-dimer levels. Pulmonary embolism suspected. EXAM: NUCLEAR MEDICINE PERFUSION LUNG SCAN TECHNIQUE: Perfusion images were obtained in multiple projections after intravenous injection of radiopharmaceutical. Ventilation scans intentionally deferred if perfusion scan and chest x-ray adequate for interpretation during COVID 19 epidemic. RADIOPHARMACEUTICALS:  4.4 mCi Tc-33m  MAA IV COMPARISON:  Chest radiographs 12/09/2021. Noncontrast chest CT 10/06/2021 and chest CTA 08/07/2016. FINDINGS: There is heterogeneous perfusion to both lungs with multiple small perfusion defects bilaterally. Decreased perfusion to the left lung base corresponds with an asymmetric left pleural effusion on recent radiographs. No segmental perfusion defects are demonstrated to strongly suggest acute pulmonary embolism in this patient with at least moderate centrilobular emphysema on previous CT. Perfusion only imaging is limited in this setting. IMPRESSION: 1. Indeterminate for pulmonary embolism with multiple small perfusion defects bilaterally which may relate to the patient's known emphysema and left pleural effusion. 2. If further evaluation is necessary and the patient is unable to receive IV contrast for chest CTA, consider further evaluation with lower extremity Doppler ultrasound. 3. These results will be called to the ordering clinician or representative by the Radiologist Assistant, and communication documented in the PACS or [employer]. Electronically Signed   By: Richardean Sale M.D.   On: 12/10/2021 13:37   ECHOCARDIOGRAM COMPLETE  Result Date: 11/27/2021    ECHOCARDIOGRAM REPORT   Patient Name:   INETTE DOUBRAVA Date of Exam: 11/26/2021 Medical Rec #:  [id number]      Height:       64.0 in Accession #:    [id number]     Weight:       126.6 lb Date of Birth:  06-18-1941      BSA:          1.611 m Patient Age:    35 years  BP:           120/68 mmHg Patient Gender: F              HR:           72 bpm. Exam Location:  [address] Procedure: 2D Echo, 3D Echo, Cardiac Doppler and Color Doppler Indications:    R01.1 Murmur  History:        Patient has prior history of Echocardiogram examinations, most                 recent 04/05/2017. CHF, CAD, TIA and COPD, Arrythmias:Atrial                 Fibrillation, Bradycardia and PVC, Signs/Symptoms:Syncope; Risk                 Factors:Former  Smoker, Dyslipidemia and Hypertension. Chronic                 Kidney Disease, Non-Ischemic Cardiomyopathy (prior EF 45-50%).  Sonographer:    Deliah Boston RDCS Referring Phys: RENEE LYNN URSUY IMPRESSIONS  1. Left ventricular ejection fraction, by estimation, is 25 to 30%. The left ventricle has severely decreased function. The left ventricle demonstrates global hypokinesis. Left ventricular diastolic parameters are consistent with Grade II diastolic dysfunction (pseudonormalization). Elevated left ventricular end-diastolic pressure.  2. Right ventricular systolic function is normal. The right ventricular size is normal. There is normal pulmonary artery systolic pressure.  3. Left atrial size was severely dilated.  4. The mitral valve is abnormal. Moderate to severe mitral valve regurgitation. Mild mitral stenosis. The mean mitral valve gradient is 5.0 mmHg with average heart rate of 70 bpm.  5. The aortic valve is tricuspid. There is mild calcification of the aortic valve. There is mild thickening of the aortic valve. Aortic valve regurgitation is not visualized. Moderate aortic valve stenosis. Aortic valve area, by VTI measures 1.03 cm. Aortic valve mean gradient measures 10.5 mmHg.  6. The inferior vena cava is normal in size with greater than 50% respiratory variability, suggesting right atrial pressure of 3 mmHg. Comparison(s): Prior images reviewed side by side. Changes from prior study are noted. EF reduced compared to study from 2018. Conclusion(s)/Recommendation(s): Severely reduced LV EF, moderate to severe MR, moderate AS by dimensionless index. FINDINGS  Left Ventricle: Left ventricular ejection fraction, by estimation, is 25 to 30%. The left ventricle has severely decreased function. The left ventricle demonstrates global hypokinesis. 3D left ventricular ejection fraction analysis performed but not reported based on interpreter judgement due to suboptimal tracking. The left ventricular internal  cavity size was normal in size. There is no left ventricular hypertrophy. Left ventricular diastolic parameters are consistent with Grade II diastolic dysfunction (pseudonormalization). Elevated left ventricular end-diastolic pressure. Right Ventricle: The right ventricular size is normal. Right vetricular wall thickness was not well visualized. Right ventricular systolic function is normal. There is normal pulmonary artery systolic pressure. The tricuspid regurgitant velocity is 1.79 m/s, and with an assumed right atrial pressure of 3 mmHg, the estimated right ventricular systolic pressure is 16.1 mmHg. Left Atrium: Left atrial size was severely dilated. Right Atrium: Right atrial size was normal in size. Pericardium: There is no evidence of pericardial effusion. Mitral Valve: The mitral valve is abnormal. There is mild thickening of the mitral valve leaflet(s). There is mild calcification of the mitral valve leaflet(s). Moderately decreased mobility of the mitral valve leaflets. Moderate to severe mitral valve regurgitation. Mild mitral valve stenosis. MV peak gradient, 12.2 mmHg.  The mean mitral valve gradient is 5.0 mmHg with average heart rate of 70 bpm. Tricuspid Valve: The tricuspid valve is normal in structure. Tricuspid valve regurgitation is trivial. No evidence of tricuspid stenosis. Aortic Valve: LVOT/AV VIT ratio 0.33, consistent with moderate AS. The aortic valve is tricuspid. There is mild calcification of the aortic valve. There is mild thickening of the aortic valve. Aortic valve regurgitation is not visualized. Moderate aortic  stenosis is present. Aortic valve mean gradient measures 10.5 mmHg. Aortic valve peak gradient measures 19.1 mmHg. Aortic valve area, by VTI measures 1.03 cm. Pulmonic Valve: The pulmonic valve was not well visualized. Pulmonic valve regurgitation is not visualized. No evidence of pulmonic stenosis. Aorta: The aortic root, ascending aorta, aortic arch and descending aorta  are all structurally normal, with no evidence of dilitation or obstruction. Venous: The inferior vena cava is normal in size with greater than 50% respiratory variability, suggesting right atrial pressure of 3 mmHg. IAS/Shunts: The atrial septum is grossly normal.  LEFT VENTRICLE PLAX 2D LVIDd:         5.00 cm   Diastology LVIDs:         4.20 cm   LV e' medial:    4.13 cm/s LV PW:         0.90 cm   LV E/e' medial:  33.1 LV IVS:        0.70 cm   LV e' lateral:   8.92 cm/s LVOT diam:     2.00 cm   LV E/e' lateral: 15.3 LV SV:         50 LV SV Index:   31 LVOT Area:     3.14 cm                           3D Volume EF:                          3D EF:        56 %                          LV EDV:       185 ml                          LV ESV:       82 ml                          LV SV:        104 ml RIGHT VENTRICLE RV S prime:     9.68 cm/s TAPSE (M-mode): 2.3 cm LEFT ATRIUM              Index        RIGHT ATRIUM           Index LA diam:        4.50 cm  2.79 cm/m   RA Area:     14.80 cm LA Vol (A2C):   78.9 ml  48.98 ml/m  RA Volume:   35.20 ml  21.85 ml/m LA Vol (A4C):   102.0 ml 63.32 ml/m LA Biplane Vol: 90.2 ml  56.00 ml/m  AORTIC VALVE AV Area (Vmax):    1.12 cm AV Area (Vmean):   1.08 cm AV Area (VTI):     1.03 cm AV  Vmax:           218.75 cm/s AV Vmean:          148.750 cm/s AV VTI:            0.490 m AV Peak Grad:      19.1 mmHg AV Mean Grad:      10.5 mmHg LVOT Vmax:         78.18 cm/s LVOT Vmean:        51.125 cm/s LVOT VTI:          0.160 m LVOT/AV VTI ratio: 0.33  AORTA Ao Root diam: 2.70 cm Ao Asc diam:  3.20 cm MITRAL VALVE                TRICUSPID VALVE MV Area (PHT): cm          TR Peak grad:   12.8 mmHg MV Area VTI:   1.07 cm     TR Vmax:        179.00 cm/s MV Peak grad:  12.2 mmHg MV Mean grad:  5.0 mmHg     SHUNTS MV Vmax:       1.75 m/s     Systemic VTI:  0.16 m MV Vmean:      98.0 cm/s    Systemic Diam: 2.00 cm MV Decel Time: 208 msec MR Peak grad: 130.4 mmHg MR Mean grad: 67.3 mmHg MR Vmax:       571.00 cm/s MR Vmean:     378.3 cm/s MV E velocity: 136.50 cm/s MV A velocity: 156.75 cm/s MV E/A ratio:  0.87 Buford Dresser MD Electronically signed by Buford Dresser MD Signature Date/Time: 11/27/2021/12:58:49 PM    Final       PFT Results Latest Ref Rng & Units 10/10/2016 03/01/2016  FVC-Pre L 1.94 2.55  FVC-Predicted Pre % 70 91  FVC-Post L 2.17 2.72  FVC-Predicted Post % 78 97  Pre FEV1/FVC % % 48 51  Post FEV1/FCV % % 51 53  FEV1-Pre L 0.93 1.30  FEV1-Predicted Pre % 45 62  FEV1-Post L 1.11 1.44  DLCO uncorrected ml/min/mmHg - 16.22  DLCO UNC% % - 65  DLCO corrected ml/min/mmHg - 15.47  DLCO COR %Predicted % - 62  DLVA Predicted % - 71  TLC L - 5.66  TLC % Predicted % - 111  RV % Predicted % - 116    No results found for: NITRICOXIDE      Assessment & Plan:   COPD exacerbation (HCC) Previously stable. Worsening SOB, wheeze, and cough. Likely multifactorial related to COPD exacerbation and worsening HF. Clinically appears stable. Depo 80 mg inj today. Start prednisone taper tomorrow.  Duoneb in office with improvement in aeration. Rx sent with referral to DME for neb machine. CXR today concerning for likely pulmonary edema given worsening heart function; possible infectious etiology. Augmentin 875 Twice daily for 7 days. Will await lab results and increase lasix.   Patient Instructions  Continue Trelegy 100 inhaler 1 puff daily. Brush tongue and rinse mouth afterwards -Continue Albuterol inhaler 1-2 puffs every 6 hours as needed for shortness of breath or wheezing. Notify if symptoms persist despite rescue inhaler/neb use. -Continue Astelin nasal spray 2 sprays each nostril daily  -Continue tessalon perles 200 mg Three times a day as needed for cough -Continue mucinex 600 mg Twice daily  -Continue atrovent nasal spray 2 sprays each nostril three times a day as needed for runny nose or nasal congesion -Continue claritin 10 mg daily -  Continue Singulair  10 mg At bedtime  -Continue omeprazole 40 mg daily   -Depo 80 mg inj given today. Duoneb nebulizer treatment administered today  -Prednisone taper pack. Start tomorrow. 4 tabs for 2 days, then 3 tabs for 2 days, 2 tabs for 2 days, then 1 tab for 2 days, then stop. Take in AM with food. -Promethazine/DM syrup 2.5 mL every 6 hours as needed for cough. May cause drowsiness. Do not drive or operate heavy machinery while taking if this occurs. Monitor your mobility and be aware that you are at an increased risk for falls while taking this if you become drowsy. -Augmentin 875 Twice daily for 7 days. Notify immediately of any rash, itching, hives, or swelling, or seek emergency care.Finish your antibiotics in their entirety. Do not stop just because symptoms improve. Take with food to reduce GI upset.   Chest x ray today. We will notify you of an abnormal results.   Labs today - d dimer and BMET  Monitor oxygen level at home for goal >88-90%. Seek emergency care if persistently below this.   Follow up in one week with Dr. Valeta Harms or Alanson Aly. If symptoms do not improve or worsen, please contact office for sooner follow up or seek emergency care.    Combined congestive systolic and diastolic heart failure (HCC) Recent worsening of HF on echo, EF 25-30% with worsening valvular dysfunction. CXR consistent with fluid overload. No LE edema on exam. Kidney function nl - increase lasix to 40 mg daily. Will send note to cardiology and advised patient follow up with them as well.  Dyspnea Likely related to COPD exacerbation and CHF exacerbation. Will obtain d dimer today - elevated. VQ scan ordered which was indeterminate for PE; however, there was no strong evidence to suggest PE and findings were consistent with previous findings of pleural effusion and emphysema. Wells Score low probability. Will monitor pt's response to increased diuretics, prednisone, and abx.    Clayton Bibles,  NP 12/13/2021  Pt aware and understands NP's role.

## 2021-12-09 NOTE — Assessment & Plan Note (Addendum)
Likely related to COPD exacerbation and CHF exacerbation. Will obtain d dimer today - elevated. VQ scan ordered which was indeterminate for PE; however, there was no strong evidence to suggest PE and findings were consistent with previous findings of pleural effusion and emphysema. Wells Score low probability. Will monitor pt's response to increased diuretics, prednisone, and abx.

## 2021-12-09 NOTE — Assessment & Plan Note (Addendum)
Recent worsening of HF on echo, EF 25-30% with worsening valvular dysfunction. CXR consistent with fluid overload. No LE edema on exam. Kidney function nl - increase lasix to 40 mg daily. Will send note to cardiology and advised patient follow up with them as well.

## 2021-12-10 ENCOUNTER — Encounter: Payer: Self-pay | Admitting: *Deleted

## 2021-12-10 ENCOUNTER — Ambulatory Visit (HOSPITAL_COMMUNITY)
Admission: RE | Admit: 2021-12-10 | Discharge: 2021-12-10 | Disposition: A | Discharge status: Home / Self Care | Payer: Medicare Other | Source: Ambulatory Visit | Attending: Nurse Practitioner | Admitting: Nurse Practitioner

## 2021-12-10 ENCOUNTER — Telehealth: Payer: Self-pay | Admitting: *Deleted

## 2021-12-10 ENCOUNTER — Telehealth: Payer: Self-pay | Admitting: Nurse Practitioner

## 2021-12-10 DIAGNOSIS — N1832 Chronic kidney disease, stage 3b: Secondary | ICD-10-CM | POA: Diagnosis not present

## 2021-12-10 DIAGNOSIS — I129 Hypertensive chronic kidney disease with stage 1 through stage 4 chronic kidney disease, or unspecified chronic kidney disease: Secondary | ICD-10-CM | POA: Diagnosis not present

## 2021-12-10 DIAGNOSIS — R0602 Shortness of breath: Secondary | ICD-10-CM | POA: Diagnosis not present

## 2021-12-10 DIAGNOSIS — R7989 Other specified abnormal findings of blood chemistry: Secondary | ICD-10-CM | POA: Diagnosis not present

## 2021-12-10 DIAGNOSIS — N200 Calculus of kidney: Secondary | ICD-10-CM | POA: Diagnosis not present

## 2021-12-10 DIAGNOSIS — I509 Heart failure, unspecified: Secondary | ICD-10-CM | POA: Diagnosis not present

## 2021-12-10 DIAGNOSIS — D631 Anemia in chronic kidney disease: Secondary | ICD-10-CM | POA: Diagnosis not present

## 2021-12-10 LAB — BASIC METABOLIC PANEL
BUN: 12 mg/dL (ref 6–23)
CO2: 26 mEq/L (ref 19–32)
Calcium: 8.8 mg/dL (ref 8.4–10.5)
Chloride: 108 mEq/L (ref 96–112)
Creatinine, Ser: 1.17 mg/dL (ref 0.40–1.20)
GFR: 43.5 mL/min — ABNORMAL LOW (ref >60.00)
Glucose, Bld: 77 mg/dL (ref 70–99)
Potassium: 4.1 mEq/L (ref 3.5–5.1)
Sodium: 140 mEq/L (ref 135–145)

## 2021-12-10 MED ORDER — TECHNETIUM TO 99M ALBUMIN AGGREGATED
4.4000 | Freq: Once | INTRAVENOUS | Status: AC
  Administered 2021-12-10: 4.4 via INTRAVENOUS

## 2021-12-10 NOTE — Addendum Note (Signed)
Addended by: Clayton Bibles on: 12/10/2021 11:08 AM   Modules accepted: Orders

## 2021-12-10 NOTE — Telephone Encounter (Signed)
Lvm to patient of instructions of TEE  12-28-21 with Dr. Harriet Masson in [employer]. Per patient to call after 1 pm but still unavailable. Left direct number [phone]  in [place] office where was sitting at only for today if can get back in touch before leave at 4: 30 pm. If not will call first thing Monday morning when back in clinic.

## 2021-12-10 NOTE — Progress Notes (Signed)
BMET collected in clinic. [facility] stating they do not have it; although d dimer was collected and sent at the same time and this has resulted.   Please notify patient that her d dimer was elevated, concerning for possible blood clot in her lungs. Order placed for STAT VQ scan given previous elevation in kidney function. Someone will call her for scheduling. Thanks.

## 2021-12-10 NOTE — Addendum Note (Signed)
Addended by: Fran Lowes on: 12/10/2021 11:21 AM   Modules accepted: Orders

## 2021-12-10 NOTE — Progress Notes (Addendum)
Please notify patient that her VQ scan was indeterminate for PE; however, there was no evidence to strongly suggest PE. Findings most consistent with her COPD and heart failure. Continue her antibiotics and prednisone as discussed yesterday. Have her increase her diuretics to 40 mg daily until she comes back to see [place]. Let us know Monday if her breathing has improved. Thanks!

## 2021-12-10 NOTE — Telephone Encounter (Signed)
Spoke with Orpah Greek already msged her that this pt may go home per [place] Nothing further needed

## 2021-12-13 ENCOUNTER — Telehealth: Payer: Self-pay | Admitting: Nurse Practitioner

## 2021-12-13 MED ORDER — FUROSEMIDE 40 MG PO TABS: 40.0000 mg | ORAL_TABLET | Freq: Every day | ORAL | 0 refills | Status: AC

## 2021-12-13 NOTE — Telephone Encounter (Signed)
Call made to patient, confirmed DOB. Patient reports she saw NP last week and was told she had Bronchitis. She said she went over to [place] [facility] [facility] to have some sort or procedure done, not sure what procedure. She states now when she exerts herself her oxygen is dropping below 88%. She states she was told to go to the emergency room if this happens. She states because it is random and does not always happen she is not sure if she should still go to the hospital. She still has 3 more days of antibiotics left to take. She is taking mucinex and cough syrup as directed. Denies fevers, chills, or body aches. Requesting recommendations. She reports the desaturations are random and are not consistent. She is able to speak in complete sentences and reported oxygen at time of phone call was 87% and states it bounces right back up to about 93%.   [place] please advise. Thanks.

## 2021-12-13 NOTE — Addendum Note (Signed)
Addended by: Clayton Bibles on: 12/13/2021 01:09 PM   Modules accepted: Orders

## 2021-12-13 NOTE — Telephone Encounter (Signed)
I called and spoke with patient regarding the above concerns. She stated that her oxygen quickly recovers to 90% and that her breathing has slightly improved with the prednisone. She did not increase her lasix to 40 mg daily as previously directed so we discuss this increase and rx was sent. She was advised to follow up with cardiology as well. I will see her in office on Friday and we will do a walking oximetry then. Otherwise, she was advised on ED precautions and to notify if her oxygen remains below 88%. Thanks!

## 2021-12-13 NOTE — Telephone Encounter (Signed)
Spoke with patient and aware of dates and Instructions.

## 2021-12-13 NOTE — Telephone Encounter (Signed)
Thanks!  Will close encounter.

## 2021-12-15 DIAGNOSIS — S42251D Displaced fracture of greater tuberosity of right humerus, subsequent encounter for fracture with routine healing: Secondary | ICD-10-CM | POA: Diagnosis not present

## 2021-12-16 ENCOUNTER — Ambulatory Visit: Payer: Medicare Other | Admitting: Nurse Practitioner

## 2021-12-16 DIAGNOSIS — N200 Calculus of kidney: Secondary | ICD-10-CM | POA: Diagnosis not present

## 2021-12-17 ENCOUNTER — Encounter (HOSPITAL_COMMUNITY): Payer: Self-pay | Admitting: Cardiology

## 2021-12-17 ENCOUNTER — Other Ambulatory Visit: Payer: Self-pay

## 2021-12-17 ENCOUNTER — Ambulatory Visit (INDEPENDENT_AMBULATORY_CARE_PROVIDER_SITE_OTHER): Payer: Medicare Other | Admitting: Nurse Practitioner

## 2021-12-17 ENCOUNTER — Ambulatory Visit: Payer: Medicare Other | Admitting: Physician Assistant

## 2021-12-17 ENCOUNTER — Encounter: Payer: Self-pay | Admitting: Nurse Practitioner

## 2021-12-17 VITALS — BP 118/64 | HR 61 | Temp 98.5°F | Ht 64.0 in | Wt 126.0 lb

## 2021-12-17 DIAGNOSIS — R911 Solitary pulmonary nodule: Secondary | ICD-10-CM | POA: Diagnosis not present

## 2021-12-17 DIAGNOSIS — K219 Gastro-esophageal reflux disease without esophagitis: Secondary | ICD-10-CM | POA: Diagnosis not present

## 2021-12-17 DIAGNOSIS — J432 Centrilobular emphysema: Secondary | ICD-10-CM | POA: Diagnosis not present

## 2021-12-17 DIAGNOSIS — J301 Allergic rhinitis due to pollen: Secondary | ICD-10-CM | POA: Diagnosis not present

## 2021-12-17 DIAGNOSIS — J9611 Chronic respiratory failure with hypoxia: Secondary | ICD-10-CM

## 2021-12-17 DIAGNOSIS — I5040 Unspecified combined systolic (congestive) and diastolic (congestive) heart failure: Secondary | ICD-10-CM

## 2021-12-17 LAB — BASIC METABOLIC PANEL
BUN: 31 mg/dL — ABNORMAL HIGH (ref 6–23)
CO2: 28 mEq/L (ref 19–32)
Calcium: 8.9 mg/dL (ref 8.4–10.5)
Chloride: 102 mEq/L (ref 96–112)
Creatinine, Ser: 1.66 mg/dL — ABNORMAL HIGH (ref 0.40–1.20)
GFR: 28.59 mL/min — ABNORMAL LOW (ref >60.00)
Glucose, Bld: 80 mg/dL (ref 70–99)
Potassium: 3.8 mEq/L (ref 3.5–5.1)
Sodium: 141 mEq/L (ref 135–145)

## 2021-12-17 MED ORDER — FUROSEMIDE 20 MG PO TABS: 40.0000 mg | ORAL_TABLET | Freq: Every day | ORAL | 1 refills | Status: AC

## 2021-12-17 NOTE — Assessment & Plan Note (Signed)
Walking oximetry desat 84% requiring 2 lpm. POC qualified at 3 lpm. New start sent to [facility]. Monitor O2 at home >88-90%.

## 2021-12-17 NOTE — Assessment & Plan Note (Signed)
Suspect contributing factor to DOE and hypoxia. TEE scheduled for later this month with cardiology follow up afterwards. Will continue lasix 40 mg daily - no dizziness or lightheadedness. BMET today.

## 2021-12-17 NOTE — Progress Notes (Signed)
Please notify patient that her kidney function has decreased and as a result, we need to decrease her lasix back to 20 mg daily. Have her contact her cardiologist Monday to discuss plans to diuresis. Thanks.

## 2021-12-17 NOTE — Assessment & Plan Note (Addendum)
Flare resolved with significant improvement in symptoms after being tx with prednisone and augmentin. Continues to experience some DOE, but has returned to baseline. Likely multifactorial r/t COPD, worsening CHF, and valvular dysfunction. Continue Trelegy daily with PRN albuterol. Will continue lasix at 40 mg daily until follow up with cardiology - check BMET today. Walking oximetry today with desaturation to 84%. Qualified for POC at 3 lpm. New start sent to [facility]. Supplemental O2 for SpO2 goal >88-90%. Advised to notify if worsening SOB or cough returns as she just completed abx and prednisone.  Patient Instructions  -Continue Trelegy 100 inhaler 1 puff daily. Brush tongue and rinse mouth afterwards -Continue Albuterol inhaler 1-2 puffs every 6 hours as needed for shortness of breath or wheezing. Notify if symptoms persist despite rescue inhaler/neb use. -Continue Astelin nasal spray 2 sprays each nostril daily  -Continue tessalon perles 200 mg Three times a day as needed for cough -Continue mucinex 600 mg Twice daily  -Continue atrovent nasal spray 2 sprays each nostril three times a day as needed for runny nose or nasal congesion -Continue claritin 10 mg daily -Continue Singulair 10 mg At bedtime  -Continue omeprazole 40 mg daily   -Continue lasix 40 mg daily until you see cardiology.    Labs today - BMET   New start oxygen. Qualified for POC with 3 lpm - use with activity. Can use continuous at home at 2-3 lpm [place]. Monitor oxygen level at home for goal >88-90%.    Follow up in two weeks with Dr. Valeta Harms or Joellen Jersey Senaida Chilcote,NP. If symptoms do not improve or worsen, please contact office for sooner follow up or seek emergency care.

## 2021-12-17 NOTE — Patient Instructions (Addendum)
-  Continue Trelegy 100 inhaler 1 puff daily. Brush tongue and rinse mouth afterwards -Continue Albuterol inhaler 1-2 puffs every 6 hours as needed for shortness of breath or wheezing. Notify if symptoms persist despite rescue inhaler/neb use. -Continue Astelin nasal spray 2 sprays each nostril daily  -Continue tessalon perles 200 mg Three times a day as needed for cough -Continue mucinex 600 mg Twice daily  -Continue atrovent nasal spray 2 sprays each nostril three times a day as needed for runny nose or nasal congesion -Continue claritin 10 mg daily -Continue Singulair 10 mg At bedtime  -Continue omeprazole 40 mg daily   -Continue lasix 40 mg daily until you see cardiology.    Labs today - BMET   New start oxygen. Qualified for POC with 3 lpm - use with activity. Can use continuous at home at 2-3 lpm [place]. Monitor oxygen level at home for goal >88-90%.    Follow up in two weeks with Dr. Valeta Harms or Lauren Jersey [name],[name]. If symptoms do not improve or worsen, please contact office for sooner follow up or seek emergency care.

## 2021-12-17 NOTE — Assessment & Plan Note (Signed)
Well-controlled. Continue on omeprazole.

## 2021-12-17 NOTE — Progress Notes (Signed)
@Patient  ID: [name], female    DOB: Aug 14, 1941, 81 y.o.   MRN: [id number]  Chief Complaint  Patient presents with   Follow-up    1 week follow up. Pt states that her SOB is better     Referring provider: Biagio Borg, MD  HPI: 81 year old female, former smoker (126 pack years) followed for severe COPD and chronic rhinitis, and lung nodule.  She is a former patient of Dr. Anastasia Pall and now followed by Dr. Valeta Harms.  She was last seen in office on 12/09/2021 by [facility] NP.  Past medical history significant for chronic A. fib on anticoagulation therapy, history of stroke, GERD, CKD stage III, insomnia, depression with anxiety.   TEST/EVENTS:  10/06/2021 Super D chest without contrast: Atherosclerosis.  Moderate sized hiatal hernia.  Moderate paraseptal and centrilobular emphysema with diffuse bronchial wall thickening.  Scarring and volume loss within the lingula and left medial lung base.  Cluster of tree-in-bud nodules within the inferior lingula likely reflect either mucoid impaction or sequelae of inflammatory infectious bronchiolitis.  7 mm nodule within the periphery of the right lower lobe is stable.  Multiple new, small nonspecific lung nodules identified, largest of which is 6 mm. 11/26/2021 echocardiogram: LVEF 25 to 30%.  Global hypokinesis of LV.  G2 DD.  RV function and size normal.  Normal PA pressure.  Moderate to severe mitral valve regurgitation.  Mild mitral stenosis.  Moderate aortic valve stenosis. 12/09/2021 CXR 2 view: mild, diffuse bilateral pulmonary opacities and small bilateral pleural effusions with associated atelectasis or consolidation  09/16/2021: OV with Dr. Valeta Harms. Ordered repeat CT of chest for further evaluation of groundglass of solid lesion seen on CT in 2018.  See results above-we will need follow-up in 3 to 6 months.  COPD well-controlled -continue Trelegy and as needed albuterol.  12/09/2021: OV with Neill Jurewicz NP. Worsening SOB and cough. Recently found to have  worsening heart function and valvular dysfunction - awaiting TEE for further eval. CXR showed pulmonary edema with bilateral small pleural effusions. Tx for COPD exacerbation and pulmonary edema r/t HF with Prednisone taper, augmentin x 7 days, and increase in lasix to 40 mg from 20 mg daily. Continued Trelegy daily. D dimer elevated - VQ scan indeterminate for PE but no evidence to strongly suggest PE and findings more consistent with severe COPD/emphysema.   12/17/2021: Today - follow up Patient presents today with husband for follow up after being treated for COPD exacerbation and pulmonary edema. She reports feeling much better and states that her shortness of breath has significantly improved and her cough is almost resolved. She still occasionally coughs and describes it as non-productive. She continues to have dyspnea upon exertion, but states it has mostly returned to her baseline. She denies wheezing, lower extremity edema, chest pain, orthopnea, or PND. She completed her prednisone taper and abx today. She is awaiting TEE with cardiology. She continues on lasix 40 mg daily. Overall, she feels better and offers no further complaints.  Allergies  Allergen Reactions   Clarithromycin Other (See Comments)    REACTION: possible rash but could have been legionarres dz with rash   Lipitor [Atorvastatin] Other (See Comments)    MUSCLE SPASMS   Simvastatin Other (See Comments)    Muscle and joint pain   Cardizem [Diltiazem Hcl] Hives   Contrast Media [Iodinated Contrast Media]     FYI - during admission at [facility] 08/2016 patient developed hives, question related to cardizem or contrast dye - not  clear   Diltiazem     Other reaction(s): rash   Statins     Other reaction(s): leg cramps   Rosuvastatin Calcium Other (See Comments)    fagitue and joint pain, "NO STATINS"    Immunization History  Administered Date(s) Administered   Fluad Quad(high Dose 65+) 08/08/2019, 08/17/2020,  09/16/2021   Influenza Split 09/11/2012   Influenza Whole 09/15/2006, 08/04/2010, 08/11/2011   Influenza, High Dose Seasonal PF 08/22/2017, 09/05/2018, 08/19/2020   Influenza,inj,Quad PF,6+ Mos 09/02/2013, 08/26/2014, 08/19/2015, 08/23/2016   PFIZER(Purple Top)SARS-COV-2 Vaccination 12/18/2019, 01/08/2020, 09/01/2020, 05/11/2021, 10/12/2021   Pneumococcal Conjugate-13 11/29/2013   Pneumococcal Polysaccharide-23 12/06/2003, 12/05/2006   Td 12/05/2004   Zoster Recombinat (Shingrix) 04/12/2017, 06/19/2017   Zoster, Live 07/07/2008    Past Medical History:  Diagnosis Date   Allergic rhinitis    Anxiety    Atrial septal aneurysm    Basal cell carcinoma    Bradycardia    Cataract    bil cataracts removed   Chronic combined systolic and diastolic CHF (congestive heart failure) (HCC)    CKD (chronic kidney disease), stage III (HCC)    Clostridium difficile infection    COPD (chronic obstructive pulmonary disease) ([place])    Depression 11/29/2013   DI (detrusor instability)    Esophageal stricture    Hemorrhoids    Hiatal hernia    History of kidney stones    HLD (hyperlipidemia)    HTN (hypertension)    Ischemic colon ([place])    a. 08/2016 - adm to  [facility] with a ruptured colon s/p right hemicolectomy with ileostomy formation for ischemic and necrotic colon.    Lung nodule    a. followed by pulmonology   Mild CAD    a. minor nonobstructive CAD 08/22/16.   Nephrolithiasis    hx   NICM (nonischemic cardiomyopathy) (HCC)    Orthostatic hypotension    Osteoarthritis    PAF (paroxysmal atrial fibrillation) ([place])    a. diagnosed 07/2016 s/p DCCV 08/01/16 with recurrence, managed with rate control for now.    Pneumonia 2013   PVC's (premature ventricular contractions)    Shingles since Nov 18, 2013   on right arm from shoulder to wrist   Status post dilation of esophageal narrowing    Stroke [facility]) 2001   stroke affected left leg   Syncope    a. 08/2016 felt due to  orthostatic hypotension.   TIA (transient ischemic attack) last 2001   anticoagulation therapy on coumadin    Tobacco History: Social History   Tobacco Use  Smoking Status Former   Packs/day: 3.00   Years: 42.00   Pack years: 126.00   Types: Cigarettes   Start date: 08/04/1954   Quit date: 12/06/1995   Years since quitting: 26.0  Smokeless Tobacco Never   Counseling given: Not Answered   Outpatient Medications Prior to Visit  Medication Sig Dispense Refill   albuterol (VENTOLIN HFA) 108 (90 Base) MCG/ACT inhaler Inhale 1-2 puffs into the lungs every 6 (six) hours as needed for wheezing or shortness of breath. 18 g 3   benzonatate (TESSALON) 200 MG capsule Take 1 capsule (200 mg total) by mouth 3 (three) times daily as needed for cough. 30 capsule 1   Biotin 5 MG CAPS Take 5 mg by mouth daily.     buPROPion (WELLBUTRIN XL) 300 MG 24 hr tablet Take 1 tablet (300 mg total) by mouth daily. 90 tablet 0   Calcium Carbonate-Vitamin D (CALCIUM + D PO) Take 1 tablet  by mouth 2 (two) times daily.     cyclobenzaprine (FLEXERIL) 5 MG tablet Take 1 tablet (5 mg total) by mouth 3 (three) times daily as needed for muscle spasms. 270 tablet 1   ezetimibe (ZETIA) 10 MG tablet Take 1 tablet (10 mg total) by mouth daily. 90 tablet 0   fluticasone (FLONASE) 50 MCG/ACT nasal spray Place 1 spray into both nostrils 2 (two) times daily. 48 g 1   Fluticasone-Umeclidin-Vilant (TRELEGY ELLIPTA) 100-62.5-25 MCG/ACT AEPB Inhale 1 puff into the lungs daily. 180 each 3   Fluticasone-Umeclidin-Vilant (TRELEGY ELLIPTA) 100-62.5-25 MCG/INH AEPB Inhale 1 puff into the lungs daily. I inhalation once daily 180 each 1   furosemide (LASIX) 40 MG tablet Take 1 tablet (40 mg total) by mouth daily. 30 tablet 0   gabapentin (NEURONTIN) 300 MG capsule 1 tab by mouth in AM, 1 po at midday, then 2 by mouth at bedtime for sleep and pain (Patient taking differently: Take 300-600 mg by mouth See admin instructions. Take 300 mg by  mouth in the morning and 600 mg at night) 360 capsule 1   guaiFENesin (MUCINEX) 600 MG 12 hr tablet Take 1,200 mg by mouth 2 (two) times daily as needed for cough or to loosen phlegm.     hydroxychloroquine (PLAQUENIL) 200 MG tablet Take 400 mg by mouth at bedtime.     ipratropium (ATROVENT) 0.03 % nasal spray Place 2 sprays into both nostrils 3 (three) times daily as needed for rhinitis. 90 mL 1   ipratropium-albuterol (DUONEB) 0.5-2.5 (3) MG/3ML SOLN Take 3 mLs by nebulization every 6 (six) hours as needed (shortness of breath or wheezing). 360 mL 3   levothyroxine (SYNTHROID) 25 MCG tablet Take 1 tablet (25 mcg total) by mouth daily before breakfast. Annual appt due in Nov must see provider for future refills 90 tablet 3   Lidocaine HCl 2 % CREA Apply 1 application topically daily as needed (for shingles flares).     loratadine (CLARITIN) 10 MG tablet Take 10 mg by mouth daily.     Melatonin 10 MG TABS Take 10 mg by mouth at bedtime.     metoprolol succinate (TOPROL-XL) 100 MG 24 hr tablet Take 1 tablet (100 mg total) by mouth daily. Take with or immediately following a meal. (Patient taking differently: Take 100 mg by mouth daily. Take with or immediately following a meal. Pt takes in the pm) 90 tablet 3   mirabegron ER (MYRBETRIQ) 50 MG TB24 tablet Take 50 mg by mouth at bedtime.     montelukast (SINGULAIR) 10 MG tablet Take 1 tablet (10 mg total) by mouth daily. Annual appt due in Nov must see provider for future refills 90 tablet 1   Multiple Vitamin (MULTIVITAMIN) tablet Take 2 tablets by mouth daily.     omeprazole (PRILOSEC) 40 MG capsule Take 1 capsule (40 mg total) by mouth daily. (Patient taking differently: Take 40 mg by mouth every evening.) 90 capsule 3   predniSONE (DELTASONE) 10 MG tablet 4 tabs for 2 days, then 3 tabs for 2 days, 2 tabs for 2 days, then 1 tab for 2 days, then stop 20 tablet 0   Probiotic Product (PROBIOTIC DAILY PO) Take 1 capsule by mouth in the morning and at  bedtime.     promethazine-dextromethorphan (PROMETHAZINE-DM) 6.25-15 MG/5ML syrup Take 2.5 mLs by mouth 4 (four) times daily as needed for cough. 118 mL 0   traZODone (DESYREL) 50 MG tablet Take 1 tablet (50 mg total) by mouth  at bedtime and may repeat dose one time if needed. (Patient taking differently: Take 100 mg by mouth at bedtime.) 180 tablet 1   amoxicillin-clavulanate (AUGMENTIN) 875-125 MG tablet Take 1 tablet by mouth 2 (two) times daily. 14 tablet 0   furosemide (LASIX) 20 MG tablet Take 1 tablet (20 mg total) by mouth daily as needed (swelling). (Patient taking differently: Take 20 mg by mouth daily.) 90 tablet 1   azelastine (ASTELIN) 0.1 % nasal spray Place 2 sprays into the nose daily. Use in each nostril as directed 90 mL 0   colestipol (COLESTID) 1 g tablet Take 2 tablets (2 g total) by mouth 2 (two) times daily. (Patient taking differently: Take 1 g by mouth 2 (two) times daily.) 360 tablet 3   diphenhydrAMINE (BENADRYL) 50 MG tablet Take 1 tablet (50 mg total) by mouth once for 1 dose. Pt to take 50 mg of Benadryl pm 07/13/21 @ 9:20 AM. Please call [phone] with any questions. (Patient not taking: Reported on 10/25/2021) 1 tablet 0   Facility-Administered Medications Prior to Visit  Medication Dose Route Frequency Provider Last Rate Last Admin   ipratropium-albuterol (DUONEB) 0.5-2.5 (3) MG/3ML nebulizer solution 3 mL  3 mL Nebulization Once Ednah Hammock, Karie Schwalbe, NP       methylPREDNISolone acetate (DEPO-MEDROL) injection 80 mg  80 mg Intramuscular Once Berk Pilot, Karie Schwalbe, NP         Review of Systems:   Constitutional: No weight loss or gain, night sweats, fevers, chills, fatigue, or lassitude. HEENT: No headaches, difficulty swallowing, tooth/dental problems, or sore throat. No sneezing, itching, ear ache, nasal congestion, or post nasal drip CV:  No chest pain, orthopnea, PND, swelling in lower extremities, anasarca, dizziness, palpitations, syncope Resp: +shortness of  breath with exertion (improved); rare non-productive cough. No excess mucus or change in color of mucus. No hemoptysis. No wheezing.  No chest wall deformity GI:  No heartburn, indigestion, abdominal pain, nausea, vomiting, diarrhea, change in bowel habits, loss of appetite, bloody stools.  GU: No dysuria, change in color of urine, urgency or frequency.  No flank pain, no hematuria  Skin: No rash, lesions, ulcerations MSK:  No joint pain or swelling.  No decreased range of motion.  No back pain. Neuro: No dizziness or lightheadedness.  Psych: No depression or anxiety. Mood stable.     Physical Exam:  BP 118/64 (BP Location: Right Arm, Patient Position: Sitting, Cuff Size: Normal)    Pulse 61    Temp 98.5 F (36.9 C) (Oral)    Ht 5\' 4"  (1.626 m)    Wt 126 lb (57.2 kg)    SpO2 94%    BMI 21.63 kg/m   GEN: Pleasant, interactive, chronically-ill appearing; in no acute distress. HEENT:  Normocephalic and atraumatic. EACs patent bilaterally. TM pearly gray with present light reflex bilaterally. PERRLA. Sclera white. Nasal turbinates pink, moist and patent bilaterally. No rhinorrhea present. Oropharynx pink and moist, without exudate or edema. No lesions, ulcerations, or postnasal drip.  NECK:  Supple w/ fair ROM. No JVD present. Normal carotid impulses w/o bruits. Thyroid symmetrical with no goiter or nodules palpated. No lymphadenopathy.   CV: RRR, no m/r/g, no peripheral edema. Pulses intact, +2 bilaterally. No cyanosis, pallor or clubbing. PULMONARY:  Unlabored, regular breathing. Diminished bases bilaterally A&P w/o wheezes/rales/rhonchi. No accessory muscle use. No dullness to percussion. GI: BS present and normoactive. Soft, non-tender to palpation. No organomegaly or masses detected. No CVA tenderness. MSK: No erythema, warmth or tenderness. Cap refil <  2 sec all extrem. No deformities or joint swelling noted.  Neuro: A/Ox3. No focal deficits noted.   Skin: Warm, no lesions or rashe Psych:  Normal affect and behavior. Judgement and thought content appropriate.     Lab Results:  CBC    Component Value Date/Time   WBC 4.9 10/26/2021 1018   RBC 4.49 10/26/2021 1018   HGB 12.0 10/26/2021 1018   HCT 37.9 10/26/2021 1018   PLT 164 10/26/2021 1018   MCV 84.4 10/26/2021 1018   MCH 26.7 10/26/2021 1018   MCHC 31.7 10/26/2021 1018   RDW 13.6 10/26/2021 1018   LYMPHSABS 1.4 05/13/2021 1025   MONOABS 0.9 05/13/2021 1025   EOSABS 0.1 05/13/2021 1025   BASOSABS 0.1 05/13/2021 1025    BMET    Component Value Date/Time   NA 140 12/09/2021 1626   K 4.1 12/09/2021 1626   CL 108 12/09/2021 1626   CO2 26 12/09/2021 1626   GLUCOSE 77 12/09/2021 1626   BUN 12 12/09/2021 1626   CREATININE 1.17 12/09/2021 1626   CREATININE 1.15 (H) 09/29/2016 0744   CALCIUM 8.8 12/09/2021 1626   GFRNONAA 47 (L) 10/26/2021 1018   GFRAA >60 08/22/2016 0513    BNP    Component Value Date/Time   BNP 343.5 (H) 08/17/2016 2151     Imaging:  DG Chest 2 View  Result Date: 12/09/2021 CLINICAL DATA:  Shortness of breath, cough EXAM: CHEST - 2 VIEW COMPARISON:  08/13/2021 FINDINGS: The heart size and mediastinal contours are within normal limits. Mild, diffuse bilateral pulmonary opacity and small bilateral pleural effusions with associated atelectasis or consolidation. Disc degenerative disease of the thoracic spine. Partially imaged thoracolumbar fusion. IMPRESSION: Mild, diffuse bilateral pulmonary opacity and small bilateral pleural effusions with associated atelectasis or consolidation. Findings may reflect edema or infection. No focal airspace opacity. Electronically Signed   By: Delanna Ahmadi M.D.   On: 12/09/2021 16:43   NM Pulmonary Perfusion  Result Date: 12/10/2021 CLINICAL DATA:  Shortness of breath on exertion. History of COPD with recent bronchitis flare. Elevated D-dimer levels. Pulmonary embolism suspected. EXAM: NUCLEAR MEDICINE PERFUSION LUNG SCAN TECHNIQUE: Perfusion images were  obtained in multiple projections after intravenous injection of radiopharmaceutical. Ventilation scans intentionally deferred if perfusion scan and chest x-ray adequate for interpretation during COVID 19 epidemic. RADIOPHARMACEUTICALS:  4.4 mCi Tc-62m MAA IV COMPARISON:  Chest radiographs 12/09/2021. Noncontrast chest CT 10/06/2021 and chest CTA 08/07/2016. FINDINGS: There is heterogeneous perfusion to both lungs with multiple small perfusion defects bilaterally. Decreased perfusion to the left lung base corresponds with an asymmetric left pleural effusion on recent radiographs. No segmental perfusion defects are demonstrated to strongly suggest acute pulmonary embolism in this patient with at least moderate centrilobular emphysema on previous CT. Perfusion only imaging is limited in this setting. IMPRESSION: 1. Indeterminate for pulmonary embolism with multiple small perfusion defects bilaterally which may relate to the patient's known emphysema and left pleural effusion. 2. If further evaluation is necessary and the patient is unable to receive IV contrast for chest CTA, consider further evaluation with lower extremity Doppler ultrasound. 3. These results will be called to the ordering clinician or representative by the Radiologist Assistant, and communication documented in the PACS or [employer]. Electronically Signed   By: Richardean Sale M.D.   On: 12/10/2021 13:37   ECHOCARDIOGRAM COMPLETE  Result Date: 11/27/2021    ECHOCARDIOGRAM REPORT   Patient Name:   [name] Date of Exam: 11/26/2021 Medical Rec #:  [id number]  Height:       64.0 in Accession #:    [id number]     Weight:       126.6 lb Date of Birth:  12/05/1941      BSA:          1.611 m Patient Age:    50 years       BP:           120/68 mmHg Patient Gender: F              HR:           72 bpm. Exam Location:  [place] Procedure: 2D Echo, 3D Echo, Cardiac Doppler and Color Doppler Indications:    R01.1 Murmur  History:         Patient has prior history of Echocardiogram examinations, most                 recent 04/05/2017. CHF, CAD, TIA and COPD, Arrythmias:Atrial                 Fibrillation, Bradycardia and PVC, Signs/Symptoms:Syncope; Risk                 Factors:Former Smoker, Dyslipidemia and Hypertension. Chronic                 Kidney Disease, Non-Ischemic Cardiomyopathy (prior EF 45-50%).  Sonographer:    Deliah Boston RDCS Referring Phys: RENEE LYNN URSUY IMPRESSIONS  1. Left ventricular ejection fraction, by estimation, is 25 to 30%. The left ventricle has severely decreased function. The left ventricle demonstrates global hypokinesis. Left ventricular diastolic parameters are consistent with Grade II diastolic dysfunction (pseudonormalization). Elevated left ventricular end-diastolic pressure.  2. Right ventricular systolic function is normal. The right ventricular size is normal. There is normal pulmonary artery systolic pressure.  3. Left atrial size was severely dilated.  4. The mitral valve is abnormal. Moderate to severe mitral valve regurgitation. Mild mitral stenosis. The mean mitral valve gradient is 5.0 mmHg with average heart rate of 70 bpm.  5. The aortic valve is tricuspid. There is mild calcification of the aortic valve. There is mild thickening of the aortic valve. Aortic valve regurgitation is not visualized. Moderate aortic valve stenosis. Aortic valve area, by VTI measures 1.03 cm. Aortic valve mean gradient measures 10.5 mmHg.  6. The inferior vena cava is normal in size with greater than 50% respiratory variability, suggesting right atrial pressure of 3 mmHg. Comparison(s): Prior images reviewed side by side. Changes from prior study are noted. EF reduced compared to study from 2018. Conclusion(s)/Recommendation(s): Severely reduced LV EF, moderate to severe MR, moderate AS by dimensionless index. FINDINGS  Left Ventricle: Left ventricular ejection fraction, by estimation, is 25 to 30%. The left ventricle  has severely decreased function. The left ventricle demonstrates global hypokinesis. 3D left ventricular ejection fraction analysis performed but not reported based on interpreter judgement due to suboptimal tracking. The left ventricular internal cavity size was normal in size. There is no left ventricular hypertrophy. Left ventricular diastolic parameters are consistent with Grade II diastolic dysfunction (pseudonormalization). Elevated left ventricular end-diastolic pressure. Right Ventricle: The right ventricular size is normal. Right vetricular wall thickness was not well visualized. Right ventricular systolic function is normal. There is normal pulmonary artery systolic pressure. The tricuspid regurgitant velocity is 1.79 m/s, and with an assumed right atrial pressure of 3 mmHg, the estimated right ventricular systolic pressure is 51.0 mmHg. Left Atrium: Left atrial size was severely dilated. Right Atrium:  Right atrial size was normal in size. Pericardium: There is no evidence of pericardial effusion. Mitral Valve: The mitral valve is abnormal. There is mild thickening of the mitral valve leaflet(s). There is mild calcification of the mitral valve leaflet(s). Moderately decreased mobility of the mitral valve leaflets. Moderate to severe mitral valve regurgitation. Mild mitral valve stenosis. MV peak gradient, 12.2 mmHg. The mean mitral valve gradient is 5.0 mmHg with average heart rate of 70 bpm. Tricuspid Valve: The tricuspid valve is normal in structure. Tricuspid valve regurgitation is trivial. No evidence of tricuspid stenosis. Aortic Valve: LVOT/AV VIT ratio 0.33, consistent with moderate AS. The aortic valve is tricuspid. There is mild calcification of the aortic valve. There is mild thickening of the aortic valve. Aortic valve regurgitation is not visualized. Moderate aortic  stenosis is present. Aortic valve mean gradient measures 10.5 mmHg. Aortic valve peak gradient measures 19.1 mmHg. Aortic valve  area, by VTI measures 1.03 cm. Pulmonic Valve: The pulmonic valve was not well visualized. Pulmonic valve regurgitation is not visualized. No evidence of pulmonic stenosis. Aorta: The aortic root, ascending aorta, aortic arch and descending aorta are all structurally normal, with no evidence of dilitation or obstruction. Venous: The inferior vena cava is normal in size with greater than 50% respiratory variability, suggesting right atrial pressure of 3 mmHg. IAS/Shunts: The atrial septum is grossly normal.  LEFT VENTRICLE PLAX 2D LVIDd:         5.00 cm   Diastology LVIDs:         4.20 cm   LV e' medial:    4.13 cm/s LV PW:         0.90 cm   LV E/e' medial:  33.1 LV IVS:        0.70 cm   LV e' lateral:   8.92 cm/s LVOT diam:     2.00 cm   LV E/e' lateral: 15.3 LV SV:         50 LV SV Index:   31 LVOT Area:     3.14 cm                           3D Volume EF:                          3D EF:        56 %                          LV EDV:       185 ml                          LV ESV:       82 ml                          LV SV:        104 ml RIGHT VENTRICLE RV S prime:     9.68 cm/s TAPSE (M-mode): 2.3 cm LEFT ATRIUM              Index        RIGHT ATRIUM           Index LA diam:        4.50 cm  2.79 cm/m   RA Area:     14.80 cm LA Vol (  A2C):   78.9 ml  48.98 ml/m  RA Volume:   35.20 ml  21.85 ml/m LA Vol (A4C):   102.0 ml 63.32 ml/m LA Biplane Vol: 90.2 ml  56.00 ml/m  AORTIC VALVE AV Area (Vmax):    1.12 cm AV Area (Vmean):   1.08 cm AV Area (VTI):     1.03 cm AV Vmax:           218.75 cm/s AV Vmean:          148.750 cm/s AV VTI:            0.490 m AV Peak Grad:      19.1 mmHg AV Mean Grad:      10.5 mmHg LVOT Vmax:         78.18 cm/s LVOT Vmean:        51.125 cm/s LVOT VTI:          0.160 m LVOT/AV VTI ratio: 0.33  AORTA Ao Root diam: 2.70 cm Ao Asc diam:  3.20 cm MITRAL VALVE                TRICUSPID VALVE MV Area (PHT): cm          TR Peak grad:   12.8 mmHg MV Area VTI:   1.07 cm     TR Vmax:        179.00  cm/s MV Peak grad:  12.2 mmHg MV Mean grad:  5.0 mmHg     SHUNTS MV Vmax:       1.75 m/s     Systemic VTI:  0.16 m MV Vmean:      98.0 cm/s    Systemic Diam: 2.00 cm MV Decel Time: 208 msec MR Peak grad: 130.4 mmHg MR Mean grad: 67.3 mmHg MR Vmax:      571.00 cm/s MR Vmean:     378.3 cm/s MV E velocity: 136.50 cm/s MV A velocity: 156.75 cm/s MV E/A ratio:  0.87 Buford Dresser MD Electronically signed by Buford Dresser MD Signature Date/Time: 11/27/2021/12:58:49 PM    Final       PFT Results Latest Ref Rng & Units 10/10/2016 03/01/2016  FVC-Pre L 1.94 2.55  FVC-Predicted Pre % 70 91  FVC-Post L 2.17 2.72  FVC-Predicted Post % 78 97  Pre FEV1/FVC % % 48 51  Post FEV1/FCV % % 51 53  FEV1-Pre L 0.93 1.30  FEV1-Predicted Pre % 45 62  FEV1-Post L 1.11 1.44  DLCO uncorrected ml/min/mmHg - 16.22  DLCO UNC% % - 65  DLCO corrected ml/min/mmHg - 15.47  DLCO COR %Predicted % - 62  DLVA Predicted % - 71  TLC L - 5.66  TLC % Predicted % - 111  RV % Predicted % - 116    No results found for: NITRICOXIDE   12/17/2021: Walking oximetry with desaturation to 84% on room air requiring 2 lpm. Walked for POC qualification - 3 lpm [place] POC with O2 94%.   Assessment & Plan:   COPD (chronic obstructive pulmonary disease) ([place]) Flare resolved with significant improvement in symptoms after being tx with prednisone and augmentin. Continues to experience some DOE, but has returned to baseline. Likely multifactorial r/t COPD, worsening CHF, and valvular dysfunction. Continue Trelegy daily with PRN albuterol. Will continue lasix at 40 mg daily until follow up with cardiology - check BMET today. Walking oximetry today with desaturation to 84%. Qualified for POC at 3 lpm. New start sent to [facility]. Supplemental O2 for SpO2 goal >88-90%. Advised  to notify if worsening SOB or cough returns as she just completed abx and prednisone.  Patient Instructions  -Continue Trelegy 100 inhaler 1 puff daily. Brush  tongue and rinse mouth afterwards -Continue Albuterol inhaler 1-2 puffs every 6 hours as needed for shortness of breath or wheezing. Notify if symptoms persist despite rescue inhaler/neb use. -Continue Astelin nasal spray 2 sprays each nostril daily  -Continue tessalon perles 200 mg Three times a day as needed for cough -Continue mucinex 600 mg Twice daily  -Continue atrovent nasal spray 2 sprays each nostril three times a day as needed for runny nose or nasal congesion -Continue claritin 10 mg daily -Continue Singulair 10 mg At bedtime  -Continue omeprazole 40 mg daily   -Continue lasix 40 mg daily until you see cardiology.    Labs today - BMET   New start oxygen. Qualified for POC with 3 lpm - use with activity. Can use continuous at home at 2-3 lpm [place]. Monitor oxygen level at home for goal >88-90%.    Follow up in two weeks with Dr. Valeta Harms or Joellen Jersey Travian Kerner,NP. If symptoms do not improve or worsen, please contact office for sooner follow up or seek emergency care.   Combined congestive systolic and diastolic heart failure ([place]) Suspect contributing factor to DOE and hypoxia. TEE scheduled for later this month with cardiology follow up afterwards. Will continue lasix 40 mg daily - no dizziness or lightheadedness. BMET today.  Chronic respiratory failure with hypoxia ([facility]) Walking oximetry desat 84% requiring 2 lpm. POC qualified at 3 lpm. New start sent to [facility]. Monitor O2 at home >88-90%.   GERD Well-controlled. Continue on omeprazole.   Allergic rhinitis Well-controlled. Continue on current regimen - Singulair, Astelin, Flonase, atrovent, Claritin.    Clayton Bibles, NP 12/17/2021  Pt aware and understands NP's role.

## 2021-12-17 NOTE — Assessment & Plan Note (Signed)
Well-controlled. Continue on current regimen - Singulair, Astelin, Flonase, atrovent, Claritin.

## 2021-12-17 NOTE — Progress Notes (Signed)
Attempted to obtain medical history via telephone, unable to reach at this time. I left a voicemail to return pre surgical testing department's phone call.  

## 2021-12-20 ENCOUNTER — Telehealth: Payer: Self-pay | Admitting: Nurse Practitioner

## 2021-12-20 ENCOUNTER — Telehealth: Payer: Self-pay | Admitting: *Deleted

## 2021-12-20 DIAGNOSIS — J9611 Chronic respiratory failure with hypoxia: Secondary | ICD-10-CM

## 2021-12-20 NOTE — Telephone Encounter (Signed)
Called and spoke with patient. She stated that Katie ordered for her to be on O2 on Friday. She was sent home with a take home take on Friday and ran out of O2 on Saturday. She has not heard from the [place].   I reviewed her chart and it looks like the order was sent to [facility]. I explained to her that [employer] is not a local company and it sometimes can take up a week before they can deliver the O2. I asked her if she had requested [employer] and she stated that she did not.   She will need to have her O2 before 2pm as she has an appt to go to.   Joellen Jersey, are you ok with [place] changing the order to a local company so that she can receive her O2 today?

## 2021-12-20 NOTE — Telephone Encounter (Signed)
That is fine. Thanks for letting me know.

## 2021-12-20 NOTE — Telephone Encounter (Signed)
Called and left vm to get clarification on standard time frame for set up for new set up through [employer].  Requested to return call to get clarification.

## 2021-12-20 NOTE — Telephone Encounter (Signed)
Called and spoke with patient. She is aware that I will replace the order today to a local company. Per her chart, it looks like she has used [facility] recently. Will send the order urgent to them. She verbalized understanding.   Nothing further needed at time of call.

## 2021-12-27 DIAGNOSIS — S42251D Displaced fracture of greater tuberosity of right humerus, subsequent encounter for fracture with routine healing: Secondary | ICD-10-CM | POA: Diagnosis not present

## 2021-12-27 DIAGNOSIS — M25511 Pain in right shoulder: Secondary | ICD-10-CM | POA: Diagnosis not present

## 2021-12-28 ENCOUNTER — Ambulatory Visit (HOSPITAL_COMMUNITY): Payer: Medicare Other | Admitting: Certified Registered Nurse Anesthetist

## 2021-12-28 ENCOUNTER — Ambulatory Visit (HOSPITAL_COMMUNITY)
Admission: RE | Admit: 2021-12-28 | Discharge: 2021-12-28 | Disposition: A | Discharge status: Home / Self Care | Payer: Medicare Other | Attending: Cardiology | Admitting: Cardiology

## 2021-12-28 ENCOUNTER — Encounter (HOSPITAL_COMMUNITY): Payer: Self-pay | Admitting: Cardiology

## 2021-12-28 ENCOUNTER — Encounter (HOSPITAL_COMMUNITY)
Admission: RE | Disposition: A | Discharge status: Home / Self Care | Payer: Self-pay | Source: Home / Self Care | Attending: Cardiology

## 2021-12-28 ENCOUNTER — Ambulatory Visit (HOSPITAL_BASED_OUTPATIENT_CLINIC_OR_DEPARTMENT_OTHER): Payer: Medicare Other

## 2021-12-28 DIAGNOSIS — Z87891 Personal history of nicotine dependence: Secondary | ICD-10-CM | POA: Diagnosis not present

## 2021-12-28 DIAGNOSIS — N189 Chronic kidney disease, unspecified: Secondary | ICD-10-CM | POA: Diagnosis not present

## 2021-12-28 DIAGNOSIS — Z8673 Personal history of transient ischemic attack (TIA), and cerebral infarction without residual deficits: Secondary | ICD-10-CM | POA: Diagnosis not present

## 2021-12-28 DIAGNOSIS — I509 Heart failure, unspecified: Secondary | ICD-10-CM | POA: Diagnosis not present

## 2021-12-28 DIAGNOSIS — I2510 Atherosclerotic heart disease of native coronary artery without angina pectoris: Secondary | ICD-10-CM | POA: Diagnosis not present

## 2021-12-28 DIAGNOSIS — I428 Other cardiomyopathies: Secondary | ICD-10-CM | POA: Diagnosis not present

## 2021-12-28 DIAGNOSIS — I5042 Chronic combined systolic (congestive) and diastolic (congestive) heart failure: Secondary | ICD-10-CM | POA: Diagnosis not present

## 2021-12-28 DIAGNOSIS — I081 Rheumatic disorders of both mitral and tricuspid valves: Secondary | ICD-10-CM | POA: Diagnosis not present

## 2021-12-28 DIAGNOSIS — I340 Nonrheumatic mitral (valve) insufficiency: Secondary | ICD-10-CM

## 2021-12-28 DIAGNOSIS — Q2112 Patent foramen ovale: Secondary | ICD-10-CM | POA: Insufficient documentation

## 2021-12-28 DIAGNOSIS — N183 Chronic kidney disease, stage 3 unspecified: Secondary | ICD-10-CM | POA: Diagnosis not present

## 2021-12-28 DIAGNOSIS — I361 Nonrheumatic tricuspid (valve) insufficiency: Secondary | ICD-10-CM | POA: Diagnosis not present

## 2021-12-28 DIAGNOSIS — I700 Atherosclerosis of aorta: Secondary | ICD-10-CM | POA: Insufficient documentation

## 2021-12-28 DIAGNOSIS — I083 Combined rheumatic disorders of mitral, aortic and tricuspid valves: Secondary | ICD-10-CM | POA: Diagnosis not present

## 2021-12-28 DIAGNOSIS — E785 Hyperlipidemia, unspecified: Secondary | ICD-10-CM | POA: Insufficient documentation

## 2021-12-28 DIAGNOSIS — I130 Hypertensive heart and chronic kidney disease with heart failure and stage 1 through stage 4 chronic kidney disease, or unspecified chronic kidney disease: Secondary | ICD-10-CM | POA: Insufficient documentation

## 2021-12-28 DIAGNOSIS — I350 Nonrheumatic aortic (valve) stenosis: Secondary | ICD-10-CM | POA: Diagnosis not present

## 2021-12-28 HISTORY — PX: BUBBLE STUDY: SHX6837

## 2021-12-28 HISTORY — PX: TEE WITHOUT CARDIOVERSION: SHX5443

## 2021-12-28 LAB — ECHO TEE
AR max vel: 1.39 cm2
AV Area VTI: 1.28 cm2
AV Area mean vel: 1.46 cm2
AV Mean grad: 7.5 mmHg
AV Peak grad: 16.8 mmHg
Ao pk vel: 2.05 m/s
MV M vel: 4.1 m/s
MV Peak grad: 67.2 mmHg
MV VTI: 1.44 cm2
Radius: 0.7 cm

## 2021-12-28 SURGERY — ECHOCARDIOGRAM, TRANSESOPHAGEAL
Anesthesia: Monitor Anesthesia Care

## 2021-12-28 MED ORDER — LIDOCAINE 2% (20 MG/ML) 5 ML SYRINGE
INTRAMUSCULAR | Status: DC | PRN
  Administered 2021-12-28: 60 mg via INTRAVENOUS

## 2021-12-28 MED ORDER — PHENYLEPHRINE HCL (PRESSORS) 10 MG/ML IV SOLN
INTRAVENOUS | Status: DC | PRN
  Administered 2021-12-28 (×2): 80 ug via INTRAVENOUS
  Administered 2021-12-28: 120 ug via INTRAVENOUS
  Administered 2021-12-28 (×2): 80 ug via INTRAVENOUS

## 2021-12-28 MED ORDER — PROPOFOL 10 MG/ML IV BOLUS
INTRAVENOUS | Status: DC | PRN
  Administered 2021-12-28: 75 ug/kg/min via INTRAVENOUS

## 2021-12-28 MED ORDER — EPHEDRINE SULFATE (PRESSORS) 50 MG/ML IJ SOLN
INTRAMUSCULAR | Status: DC | PRN
  Administered 2021-12-28 (×2): 5 mg via INTRAVENOUS

## 2021-12-28 MED ORDER — SODIUM CHLORIDE 0.9 % IV SOLN: INTRAVENOUS | Status: DC

## 2021-12-28 MED ORDER — SODIUM CHLORIDE 0.9 % IV SOLN: INTRAVENOUS | Status: DC | PRN

## 2021-12-28 NOTE — Progress Notes (Signed)
°  Echocardiogram Echocardiogram Transesophageal has been performed.  Johny Chess 12/28/2021, 10:52 AM

## 2021-12-28 NOTE — CV Procedure (Signed)
° ° °  TRANSESOPHAGEAL ECHOCARDIOGRAM   NAME:  [name]    MRN: [id number] DOB:  October 15, 1941    ADMIT DATE: 12/28/2021  INDICATIONS: Severe mitral regurgitation  PROCEDURE:   Informed consent was obtained prior to the procedure. The risks, benefits and alternatives for the procedure were discussed and the patient comprehended these risks.  Risks include, but are not limited to, cough, sore throat, vomiting, nausea, somnolence, esophageal and stomach trauma or perforation, bleeding, low blood pressure, aspiration, pneumonia, infection, trauma to the teeth and death.    Procedural time out performed. The oropharynx was anesthetized with viscous lidocaine.  Anesthesia was administered by the anaesthesilogy team to achieve and maintain moderate to deep conscious sedation.  The patient's heart rate, blood pressure, and oxygen saturation were monitored continuously during the procedure.  The transesophageal probe was inserted in the esophagus and stomach without difficulty and multiple views were obtained.   The patient tolerated the procedure well.  COMPLICATIONS:    There were no immediate complications.  KEY FINDINGS:  Ejection fraction depressed 30%, severe secondary mitral regurgitation, mild to moderate aortic stenosis. Full report to follow. Further management per primary team.   Berniece Salines, DO [place]  10:42 AM

## 2021-12-28 NOTE — Transfer of Care (Signed)
Immediate Anesthesia Transfer of Care Note  Patient: [name]  Procedure(s) Performed: TRANSESOPHAGEAL ECHOCARDIOGRAM (TEE) BUBBLE STUDY  Patient Location: PACU  Anesthesia Type:MAC  Level of Consciousness: awake, patient cooperative and responds to stimulation  Airway & Oxygen Therapy: Patient Spontanous Breathing and Patient connected to nasal cannula oxygen  Post-op Assessment: Report given to RN and Post -op Vital signs reviewed and stable  Post vital signs: Reviewed and stable  Last Vitals:  Vitals Value Taken Time  BP 94/65 12/28/21 1044  Temp    Pulse 44 12/28/21 1045  Resp 18 12/28/21 1045  SpO2 99 % 12/28/21 1045  Vitals shown include unvalidated device data.  Last Pain:  Vitals:   12/28/21 0800  TempSrc: Temporal  PainSc: 0-No pain         Complications: No notable events documented.

## 2021-12-28 NOTE — Anesthesia Preprocedure Evaluation (Addendum)
Anesthesia Evaluation  Patient identified by MRN, date of birth, ID band Patient awake    Reviewed: Allergy & Precautions, NPO status , Patient's Chart, lab work & pertinent test results  Airway Mallampati: II  TM Distance: >3 FB Neck ROM: Full    Dental  (+) Upper Dentures, Partial Lower   Pulmonary COPD (3L ATC),  oxygen dependent, former smoker,     + decreased breath sounds      Cardiovascular hypertension, Pt. on medications and Pt. on home beta blockers + CAD and +CHF  + dysrhythmias Atrial Fibrillation  Rhythm:Irregular Rate:Tachycardia     Neuro/Psych Anxiety Depression TIACVA (2001, leg weakness), Residual Symptoms    GI/Hepatic Neg liver ROS, hiatal hernia, GERD  ,  Endo/Other  negative endocrine ROS  Renal/GU CRFRenal disease  negative genitourinary   Musculoskeletal  (+) Arthritis , Osteoarthritis,    Abdominal   Peds  Hematology negative hematology ROS (+)   Anesthesia Other Findings   Reproductive/Obstetrics                            Anesthesia Physical Anesthesia Plan  ASA: 4  Anesthesia Plan: MAC   Post-op Pain Management:    Induction: Intravenous  PONV Risk Score and Plan: 2 and Propofol infusion and Treatment may vary due to age or medical condition  Airway Management Planned: Simple Face Mask, Natural Airway and Nasal Cannula  Additional Equipment: None  Intra-op Plan:   Post-operative Plan:   Informed Consent: I have reviewed the patients History and Physical, chart, labs and discussed the procedure including the risks, benefits and alternatives for the proposed anesthesia with the patient or authorized representative who has indicated his/her understanding and acceptance.     Dental advisory given  Plan Discussed with: CRNA  Anesthesia Plan Comments: (Lab Results      Component                Value               Date                      WBC                       4.9                 10/26/2021                HGB                      12.0                10/26/2021                HCT                      37.9                10/26/2021                MCV                      84.4                10/26/2021                PLT  164                 10/26/2021           Lab Results      Component                Value               Date                      NA                       141                 12/17/2021                K                        3.8                 12/17/2021                CO2                      28                  12/17/2021                GLUCOSE                  80                  12/17/2021                BUN                      31 (H)              12/17/2021                CREATININE               1.66 (H)            12/17/2021                CALCIUM                  8.9                 12/17/2021                GFRNONAA                 47 (L)              10/26/2021            ECHO 12/22: 1. Left ventricular ejection fraction, by estimation, is 25 to 30%. The  left ventricle has severely decreased function. The left ventricle  demonstrates global hypokinesis. Left ventricular diastolic parameters are  consistent with Grade II diastolic  dysfunction (pseudonormalization). Elevated left ventricular end-diastolic  pressure.  2. Right ventricular systolic function is normal. The right ventricular  size is normal. There is normal pulmonary artery systolic pressure.  3. Left atrial size was severely dilated.  4. The mitral valve is abnormal. Moderate to severe mitral valve  regurgitation. Mild mitral stenosis. The mean mitral valve  gradient is 5.0  mmHg with average heart rate of 70 bpm.  5. The aortic valve is tricuspid. There is mild calcification of the  aortic valve. There is mild thickening of the aortic valve. Aortic valve  regurgitation is not visualized. Moderate aortic valve  stenosis. Aortic  valve area, by VTI measures 1.03 cm.  Aortic valve mean gradient measures 10.5 mmHg.  6. The inferior vena cava is normal in size with greater than 50%  respiratory variability, suggesting right atrial pressure of 3 mmHg. )      Anesthesia Quick Evaluation

## 2021-12-28 NOTE — H&P (Signed)
Cardiology Admission History and Physical:   Patient ID: [name] MRN: [id number]; DOB: [birth year]   Admission date: 12/28/2021  PCP:  Biagio Borg, MD   [facility] Providers Cardiologist:  Will Meredith Leeds, MD  Electrophysiologist:  Will Meredith Leeds, MD       Chief Complaint:  History of CVA  Patient Profile:   [name] is a 81 y.o. female with history of severe who is being seen 12/28/2021 for the evaluation of TEE.  History of Present Illness:   [name] with medical history including hyperlipidemia, hypertension, nonischemic cardiomyopathy and PVCs with recent stroke here to be evaluated.  She has no complaints today.   Past Medical History:  Diagnosis Date   Allergic rhinitis    Anxiety    Atrial septal aneurysm    Basal cell carcinoma    Bradycardia    Cataract    bil cataracts removed   Chronic combined systolic and diastolic CHF (congestive heart failure) (HCC)    CKD (chronic kidney disease), stage III (HCC)    Clostridium difficile infection    COPD (chronic obstructive pulmonary disease) ([place])    Depression 11/29/2013   DI (detrusor instability)    Esophageal stricture    Hemorrhoids    Hiatal hernia    History of kidney stones    HLD (hyperlipidemia)    HTN (hypertension)    Ischemic colon ([place])    a. 08/2016 - adm to  [facility] with a ruptured colon s/p right hemicolectomy with ileostomy formation for ischemic and necrotic colon.    Lung nodule    a. followed by pulmonology   Mild CAD    a. minor nonobstructive CAD 08/22/16.   Nephrolithiasis    hx   NICM (nonischemic cardiomyopathy) (HCC)    Orthostatic hypotension    Osteoarthritis    PAF (paroxysmal atrial fibrillation) ([place])    a. diagnosed 07/2016 s/p DCCV 08/01/16 with recurrence, managed with rate control for now.    Pneumonia 2013   PVC's (premature ventricular contractions)    Shingles since Nov 18, 2013   on right arm from shoulder to wrist   Status post  dilation of esophageal narrowing    Stroke [facility]) 2001   stroke affected left leg   Syncope    a. 08/2016 felt due to orthostatic hypotension.   TIA (transient ischemic attack) last 2001   anticoagulation therapy on coumadin    Past Surgical History:  Procedure Laterality Date   ANTERIOR LATERAL LUMBAR FUSION 4 LEVELS Right 07/03/2015   Procedure: Right Lumbar one-two, Lumbar two-three, Lumbar three-four, Lumbar four-five Anterior lateral lumbar interbody fusion;  Surgeon: Erline Levine, MD;  Location: [place] [facility];  Service: Neurosurgery;  Laterality: Right;  Right L1-2 L2-3 L3-4 L4-5 Anterior lateral lumbar interbody fusion   BALLOON DILATION N/A 03/03/2014   Procedure: BALLOON DILATION;  Surgeon: Inda Castle, MD;  Location: [facility];  Service: Endoscopy;  Laterality: N/A;   BLADDER SUSPENSION     BREAST BIOPSY Right 07/03/2006   CARDIAC CATHETERIZATION N/A 08/22/2016   Procedure: Left Heart Cath and Coronary Angiography;  Surgeon: Peter M Martinique, MD;  Location: [place] CV LAB;  Service: Cardiovascular;  Laterality: N/A;   CARDIOVERSION N/A 08/01/2016   Procedure: CARDIOVERSION;  Surgeon: Jerline Pain, MD;  Location: [place];  Service: Cardiovascular;  Laterality: N/A;   CARDIOVERSION N/A 10/11/2016   Procedure: CARDIOVERSION;  Surgeon: Sueanne Margarita, MD;  Location: [place];  Service: Cardiovascular;  Laterality: N/A;   COLONOSCOPY     CYSTOSCOPY WITH RETROGRADE PYELOGRAM, URETEROSCOPY AND STENT PLACEMENT Left 11/04/2021   Procedure: CYSTOSCOPY WITH RETROGRADE PYELOGRAM, URETEROSCOPY  HOLMIUM LASER AND STENT PLACEMENT;  Surgeon: Ardis Hughs, MD;  Location: [facility];  Service: Urology;  Laterality: Left;   ESOPHAGOGASTRODUODENOSCOPY N/A 03/03/2014   Procedure: ESOPHAGOGASTRODUODENOSCOPY (EGD);  Surgeon: Inda Castle, MD;  Location: [facility];  Service: Endoscopy;  Laterality: N/A;   EXCISION OF BASAL CELL CA  2011   FINGER SURGERY     GLUTEUS MINIMUS  REPAIR Right 03/15/2016   Procedure: RIGHT HIP GLUTEUS MEDIUS TENDON REPAIR;  Surgeon: Paralee Cancel, MD;  [facility]: [facility];  Service: Orthopedics;  Laterality: Right;   ileostomy reversal     LUMBAR PERCUTANEOUS PEDICLE SCREW 4 LEVEL Right 07/03/2015   Procedure: Lumbar one-Sacral one Bilateral percutaneous pedicle screws;  Surgeon: Erline Levine, MD;  Location: [place] NEURO ORS;  Service: Neurosurgery;  Laterality: Right;  L1-S1 Bilateral percutaneous pedicle screws   RECTOCELE REPAIR  2008   A&P REPAIR with vaginal repair-DR. MCDIARMID   THUMB SURGERY Right 10-15 yrs ago   UPPER GASTROINTESTINAL ENDOSCOPY     VAGINAL HYSTERECTOMY  1983   NO BSO-DR. MABRY   VARICOSE VEIN SURGERY       Medications Prior to Admission: Prior to Admission medications   Medication Sig Start Date End Date Taking? Authorizing Provider  albuterol (VENTOLIN HFA) 108 (90 Base) MCG/ACT inhaler Inhale 1-2 puffs into the lungs every 6 (six) hours as needed for wheezing or shortness of breath. 12/08/20  Yes Parrett, Tammy S, NP  benzonatate (TESSALON) 200 MG capsule Take 1 capsule (200 mg total) by mouth 3 (three) times daily as needed for cough. 08/10/21 08/10/22 Yes Parrett, Tammy S, NP  Biotin 5 MG CAPS Take 5 mg by mouth daily.   Yes [provider]  buPROPion (WELLBUTRIN XL) 300 MG 24 hr tablet Take 1 tablet (300 mg total) by mouth daily. 10/26/21  Yes Biagio Borg, MD  Calcium Carbonate-Vitamin D (CALCIUM + D PO) Take 1 tablet by mouth 2 (two) times daily.   Yes [provider]  colestipol (COLESTID) 1 g tablet Take 1 g by mouth 2 (two) times daily.   Yes [provider]  ezetimibe (ZETIA) 10 MG tablet Take 1 tablet (10 mg total) by mouth daily. 10/26/21 01/24/22 Yes Biagio Borg, MD  ferrous sulfate 325 (65 FE) MG tablet Take 325 mg by mouth daily with breakfast.   Yes [provider]  fluticasone (FLONASE) 50 MCG/ACT nasal spray Place 1 spray into both nostrils 2 (two) times daily.  06/10/21  Yes Parrett, Tammy S, NP  Fluticasone-Umeclidin-Vilant (TRELEGY ELLIPTA) 100-62.5-25 MCG/INH AEPB Inhale 1 puff into the lungs daily. I inhalation once daily 12/08/20  Yes Parrett, Tammy S, NP  furosemide (LASIX) 20 MG tablet Take 2 tablets (40 mg total) by mouth daily. 12/17/21  Yes Cobb, Karie Schwalbe, NP  gabapentin (NEURONTIN) 300 MG capsule 1 tab by mouth in AM, 1 po at midday, then 2 by mouth at bedtime for sleep and pain Patient taking differently: Take 300-600 mg by mouth See admin instructions. Take 300 mg by mouth in the morning and 600 mg at night 06/17/21  Yes Biagio Borg, MD  guaiFENesin (MUCINEX) 600 MG 12 hr tablet Take 1,200 mg by mouth daily.   Yes [provider]  hydroxychloroquine (PLAQUENIL) 200 MG tablet Take 400 mg by mouth at bedtime.   Yes [provider]  ipratropium (ATROVENT) 0.03 % nasal spray Place 2 sprays into both nostrils 3 (three) times daily as needed for rhinitis. 06/10/21  Yes Parrett, Tammy S, NP  ipratropium-albuterol (DUONEB) 0.5-2.5 (3) MG/3ML SOLN Take 3 mLs by nebulization every 6 (six) hours as needed (shortness of breath or wheezing). 12/09/21  Yes Cobb, Karie Schwalbe, NP  levothyroxine (SYNTHROID) 25 MCG tablet Take 1 tablet (25 mcg total) by mouth daily before breakfast. Annual appt due in Nov must see provider for future refills 12/24/20  Yes Biagio Borg, MD  Lidocaine HCl 2 % CREA Apply 1 application topically daily as needed (for shingles flares).   Yes [provider]  loratadine (CLARITIN) 10 MG tablet Take 10 mg by mouth daily.   Yes [provider]  Melatonin 10 MG TABS Take 10 mg by mouth at bedtime.   Yes [provider]  metoprolol succinate (TOPROL-XL) 100 MG 24 hr tablet Take 1 tablet (100 mg total) by mouth daily. Take with or immediately following a meal. Patient taking differently: Take 100 mg by mouth every evening. Take with or immediately following a meal. 09/24/21  Yes Camnitz, Will Hassell Done, MD   mirabegron ER (MYRBETRIQ) 50 MG TB24 tablet Take 50 mg by mouth at bedtime.   Yes [provider]  montelukast (SINGULAIR) 10 MG tablet Take 1 tablet (10 mg total) by mouth daily. Annual appt due in Nov must see provider for future refills 11/05/21  Yes Biagio Borg, MD  Multiple Vitamin (MULTIVITAMIN) tablet Take 2 tablets by mouth daily.   Yes [provider]  omeprazole (PRILOSEC) 40 MG capsule Take 1 capsule (40 mg total) by mouth daily. Patient taking differently: Take 40 mg by mouth every evening. 05/28/21  Yes Biagio Borg, MD  Probiotic Product (PROBIOTIC DAILY PO) Take 1 capsule by mouth in the morning and at bedtime.   Yes [provider]  promethazine-dextromethorphan (PROMETHAZINE-DM) 6.25-15 MG/5ML syrup Take 2.5 mLs by mouth 4 (four) times daily as needed for cough. 12/09/21  Yes Cobb, Karie Schwalbe, NP  traZODone (DESYREL) 50 MG tablet Take 1 tablet (50 mg total) by mouth at bedtime and may repeat dose one time if needed. Patient taking differently: Take 100 mg by mouth at bedtime. 09/24/21  Yes Biagio Borg, MD  cyclobenzaprine (FLEXERIL) 5 MG tablet Take 1 tablet (5 mg total) by mouth 3 (three) times daily as needed for muscle spasms. 10/17/18   Biagio Borg, MD  furosemide (LASIX) 40 MG tablet Take 1 tablet (40 mg total) by mouth daily. Patient not taking: Reported on 12/23/2021 12/13/21   Clayton Bibles, NP     Allergies:    Allergies  Allergen Reactions   Clarithromycin Other (See Comments)    REACTION: possible rash but could have been legionarres dz with rash   Lipitor [Atorvastatin] Other (See Comments)    MUSCLE SPASMS   Simvastatin Other (See Comments)    Muscle and joint pain   Cardizem [Diltiazem Hcl] Hives   Contrast Media [Iodinated Contrast Media]     FYI - during admission at [facility] 08/2016 patient developed hives, question related to cardizem or contrast dye - not clear   Diltiazem     Other reaction(s): rash   Statins      Other reaction(s): leg cramps   Rosuvastatin Calcium Other (See Comments)    fagitue and joint pain, "NO STATINS"    Social History:   Social History   Socioeconomic History  Marital status: Married    Spouse name: Shanon Brow   Number of children: 2   Years of education: Not on file   Highest education level: Not on file  Occupational History   Occupation: retired    Fish farm manager: RETIRED  Tobacco Use   Smoking status: Former    Packs/day: 3.00    Years: 42.00    Pack years: 126.00    Types: Cigarettes    Start date: 08/04/1954    Quit date: 12/06/1995    Years since quitting: 26.0   Smokeless tobacco: Never  Vaping Use   Vaping Use: Never used  Substance and Sexual Activity   Alcohol use: Never   Drug use: Never   Sexual activity: Not Currently    Birth control/protection: Surgical    Comment: 1st intercourse 81 yo-Fewer than 5 partners  Other Topics Concern   Not on file  Social History Narrative   Retired- Investment banker, operational.       Financial controller Pulmonary:   Originally from [place]. Has always lived in [place]. She has traveled to [place], [place], [place], [place], [place], [place]. Has been on multiple cruises. 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 She did use a face mask consistently to avoid chemical fume exposure. She may have only had an inhaled exposure a couple of times. She currently has a dog. Remote exposure to a parakeet. No mold or hot tub exposure. No asbestos exposure.    Social Determinants of Health   Financial Resource Strain: Low Risk    Difficulty of Paying Living Expenses: Not hard at all  Food Insecurity: No Food Insecurity   Worried About Charity fundraiser in the Last Year: Never true   [place] in the Last Year: Never true  Transportation Needs: No Transportation Needs   Lack of Transportation (Medical): No   Lack of Transportation (Non-Medical): No  Physical  Activity: Sufficiently Active   Days of Exercise per Week: 5 days   Minutes of Exercise per Session: 30 min  Stress: No Stress Concern Present   Feeling of Stress : Not at all  Social Connections: Socially Integrated   Frequency of Communication with Friends and Family: More than three times a week   Frequency of Social Gatherings with Friends and Family: More than three times a week   Attends Religious Services: More than 4 times per year   Active Member of [employer] or Organizations: Yes   Attends Music therapist: More than 4 times per year   Marital Status: Married  Human resources officer Violence: Not At Risk   Fear of Current or Ex-Partner: No   Emotionally Abused: No   Physically Abused: No   Sexually Abused: No    Family History:   The patient's family history includes Breast cancer in her maternal aunt, maternal aunt, maternal aunt, and mother; COPD in her mother; Emphysema in her mother; Uterine cancer in her mother. There is no history of Colon cancer, Esophageal cancer, Rectal cancer, Stomach cancer, or Pancreatic cancer.    ROS:  Please see the history of present illness.  All other ROS reviewed and negative.     Physical Exam/Data:   Vitals:   12/28/21 0800  BP: 105/67  Pulse: (!) 106  Resp: (!) 23  Temp: 97.6 F (36.4 C)  TempSrc: Temporal  SpO2: 100%  Weight: 57.2 kg  Height: 5\' 4"  (1.626 m)   No intake or  output data in the 24 hours ending 12/28/21 0928 Last 3 Weights 12/28/2021 12/17/2021 12/09/2021  Weight (lbs) 126 lb 126 lb 126 lb  Weight (kg) 57.153 kg 57.153 kg 57.153 kg     Body mass index is 21.63 kg/m.  General:  Well nourished, well developed, in no acute distress HEENT: normal Neck: no JVD Vascular: No carotid bruits; Distal pulses 2+ bilaterally   Cardiac:  normal S1, S2; RRR; no murmur  Lungs:  clear to auscultation bilaterally, no wheezing, rhonchi or rales  Abd: soft, nontender, no hepatomegaly  Ext: no edema Musculoskeletal:  No  deformities, BUE and BLE strength normal and equal Skin: warm and dry  Neuro:  CNs 2-12 intact, no focal abnormalities noted Psych:  Normal affect    EKG: None performed today  Relevant CV Studies: TTE 11/26/2021 IMPRESSIONS     1. Left ventricular ejection fraction, by estimation, is 25 to 30%. The  left ventricle has severely decreased function. The left ventricle  demonstrates global hypokinesis. Left ventricular diastolic parameters are  consistent with Grade II diastolic  dysfunction (pseudonormalization). Elevated left ventricular end-diastolic  pressure.   2. Right ventricular systolic function is normal. The right ventricular  size is normal. There is normal pulmonary artery systolic pressure.   3. Left atrial size was severely dilated.   4. The mitral valve is abnormal. Moderate to severe mitral valve  regurgitation. Mild mitral stenosis. The mean mitral valve gradient is 5.0  mmHg with average heart rate of 70 bpm.   5. The aortic valve is tricuspid. There is mild calcification of the  aortic valve. There is mild thickening of the aortic valve. Aortic valve  regurgitation is not visualized. Moderate aortic valve stenosis. Aortic  valve area, by VTI measures 1.03 cm.  Aortic valve mean gradient measures 10.5 mmHg.   6. The inferior vena cava is normal in size with greater than 50%  respiratory variability, suggesting right atrial pressure of 3 mmHg.   Comparison(s): Prior images reviewed side by side. Changes from prior  study are noted. EF reduced compared to study from 2018.   Conclusion(s)/Recommendation(s): Severely reduced LV EF, moderate to  severe MR, moderate AS by dimensionless index.   FINDINGS   Left Ventricle: Left ventricular ejection fraction, by estimation, is 25  to 30%. The left ventricle has severely decreased function. The left  ventricle demonstrates global hypokinesis. 3D left ventricular ejection  fraction analysis performed but not   reported based on interpreter judgement due to suboptimal tracking. The  left ventricular internal cavity size was normal in size. There is no left  ventricular hypertrophy. Left ventricular diastolic parameters are  consistent with Grade II diastolic  dysfunction (pseudonormalization). Elevated left ventricular end-diastolic  pressure.   Right Ventricle: The right ventricular size is normal. Right vetricular  wall thickness was not well visualized. Right ventricular systolic  function is normal. There is normal pulmonary artery systolic pressure.  The tricuspid regurgitant velocity is 1.79  m/s, and with an assumed right atrial pressure of 3 mmHg, the estimated  right ventricular systolic pressure is 81.8 mmHg.   Left Atrium: Left atrial size was severely dilated.   Right Atrium: Right atrial size was normal in size.   Pericardium: There is no evidence of pericardial effusion.   Mitral Valve: The mitral valve is abnormal. There is mild thickening of  the mitral valve leaflet(s). There is mild calcification of the mitral  valve leaflet(s). Moderately decreased mobility of the mitral valve  leaflets. Moderate  to severe mitral valve  regurgitation. Mild mitral valve stenosis. MV peak gradient, 12.2 mmHg.  The mean mitral valve gradient is 5.0 mmHg with average heart rate of 70  bpm.   Tricuspid Valve: The tricuspid valve is normal in structure. Tricuspid  valve regurgitation is trivial. No evidence of tricuspid stenosis.   Aortic Valve: LVOT/AV VIT ratio 0.33, consistent with moderate AS. The  aortic valve is tricuspid. There is mild calcification of the aortic  valve. There is mild thickening of the aortic valve. Aortic valve  regurgitation is not visualized. Moderate aortic   stenosis is present. Aortic valve mean gradient measures 10.5 mmHg.  Aortic valve peak gradient measures 19.1 mmHg. Aortic valve area, by VTI  measures 1.03 cm.   Pulmonic Valve: The pulmonic valve was  not well visualized. Pulmonic valve  regurgitation is not visualized. No evidence of pulmonic stenosis.   Aorta: The aortic root, ascending aorta, aortic arch and descending aorta  are all structurally normal, with no evidence of dilitation or  obstruction.   Venous: The inferior vena cava is normal in size with greater than 50%  respiratory variability, suggesting right atrial pressure of 3 mmHg.   IAS/Shunts: The atrial septum is grossly normal.   Laboratory Data:  High Sensitivity Troponin:  No results for input(s): TROPONINIHS in the last 720 hours.    ChemistryNo results for input(s): NA, K, CL, CO2, GLUCOSE, BUN, CREATININE, CALCIUM, MG, GFRNONAA, GFRAA, ANIONGAP in the last 168 hours.  No results for input(s): PROT, ALBUMIN, AST, ALT, ALKPHOS, BILITOT in the last 168 hours. Lipids No results for input(s): CHOL, TRIG, HDL, LABVLDL, LDLCALC, CHOLHDL in the last 168 hours. HematologyNo results for input(s): WBC, RBC, HGB, HCT, MCV, MCH, MCHC, RDW, PLT in the last 168 hours. Thyroid No results for input(s): TSH, FREET4 in the last 168 hours. BNPNo results for input(s): BNP, PROBNP in the last 168 hours.  DDimer No results for input(s): DDIMER in the last 168 hours.   Radiology/Studies:  No results found.   Assessment and Plan:   History of CVA Moderate to severe mitral regurgitation Nonischemic cardiomyopathy Moderate aortic stenosis  Plan for TEE today will assess valvular heart disease.   Shared Decision Making/Informed Consent The risks [esophageal damage, perforation (1:10,000 risk), bleeding, pharyngeal hematoma as well as other potential complications associated with conscious sedation including aspiration, arrhythmia, respiratory failure and death], benefits (treatment guidance and diagnostic support) and alternatives of a transesophageal echocardiogram were discussed in detail with Ms. Titterington and she is willing to proceed.     Risk Assessment/Risk Scores:         Severity of Illness: The appropriate patient status for this patient is OBSERVATION. Observation status is judged to be reasonable and necessary in order to provide the required intensity of service to ensure the patient's safety. The patient's presenting symptoms, physical exam findings, and initial radiographic and laboratory data in the context of their medical condition is felt to place them at decreased risk for further clinical deterioration. Furthermore, it is anticipated that the patient will be medically stable for discharge from the hospital within 2 midnights of admission.    For questions or updates, please contact [place] Please consult [email or web address] for contact info under     Signed, Berniece Salines, DO  12/28/2021 9:28 AM

## 2021-12-29 ENCOUNTER — Encounter (HOSPITAL_COMMUNITY): Payer: Self-pay | Admitting: Cardiology

## 2021-12-29 DIAGNOSIS — Z79899 Other long term (current) drug therapy: Secondary | ICD-10-CM | POA: Diagnosis not present

## 2021-12-29 DIAGNOSIS — Z6821 Body mass index (BMI) 21.0-21.9, adult: Secondary | ICD-10-CM | POA: Diagnosis not present

## 2021-12-29 DIAGNOSIS — M150 Primary generalized (osteo)arthritis: Secondary | ICD-10-CM | POA: Diagnosis not present

## 2021-12-29 DIAGNOSIS — M154 Erosive (osteo)arthritis: Secondary | ICD-10-CM | POA: Diagnosis not present

## 2021-12-29 NOTE — Anesthesia Postprocedure Evaluation (Signed)
Anesthesia Post Note  Patient: [name]  Procedure(s) Performed: TRANSESOPHAGEAL ECHOCARDIOGRAM (TEE) BUBBLE STUDY     Patient location during evaluation: Endoscopy Anesthesia Type: MAC Level of consciousness: awake and alert Pain management: pain level controlled Vital Signs Assessment: post-procedure vital signs reviewed and stable Respiratory status: spontaneous breathing, nonlabored ventilation, respiratory function stable and patient connected to nasal cannula oxygen Cardiovascular status: stable and blood pressure returned to baseline Postop Assessment: no apparent nausea or vomiting Anesthetic complications: no   No notable events documented.  Last Vitals:  Vitals:   12/28/21 1100 12/28/21 1115  BP: 107/84 99/72  Pulse: (!) 132 (!) 114  Resp: 18 16  Temp:    SpO2: 100% 98%    Last Pain:  Vitals:   12/28/21 1115  TempSrc:   PainSc: 0-No pain                 Belenda Cruise P Concetta Guion

## 2021-12-31 ENCOUNTER — Ambulatory Visit (INDEPENDENT_AMBULATORY_CARE_PROVIDER_SITE_OTHER): Payer: Medicare Other | Admitting: Pulmonary Disease

## 2021-12-31 ENCOUNTER — Other Ambulatory Visit: Payer: Self-pay

## 2021-12-31 ENCOUNTER — Encounter: Payer: Self-pay | Admitting: Pulmonary Disease

## 2021-12-31 VITALS — BP 80/52 | HR 103 | Temp 97.7°F | Ht 63.0 in | Wt 123.2 lb

## 2021-12-31 DIAGNOSIS — I340 Nonrheumatic mitral (valve) insufficiency: Secondary | ICD-10-CM

## 2021-12-31 DIAGNOSIS — J432 Centrilobular emphysema: Secondary | ICD-10-CM

## 2021-12-31 DIAGNOSIS — I5040 Unspecified combined systolic (congestive) and diastolic (congestive) heart failure: Secondary | ICD-10-CM

## 2021-12-31 DIAGNOSIS — R918 Other nonspecific abnormal finding of lung field: Secondary | ICD-10-CM

## 2021-12-31 DIAGNOSIS — Z8616 Personal history of COVID-19: Secondary | ICD-10-CM

## 2021-12-31 DIAGNOSIS — Q2112 Patent foramen ovale: Secondary | ICD-10-CM | POA: Diagnosis not present

## 2021-12-31 DIAGNOSIS — J9611 Chronic respiratory failure with hypoxia: Secondary | ICD-10-CM | POA: Diagnosis not present

## 2021-12-31 DIAGNOSIS — R911 Solitary pulmonary nodule: Secondary | ICD-10-CM | POA: Diagnosis not present

## 2021-12-31 NOTE — Progress Notes (Signed)
Synopsis: Referred in October 2022 for establish care with new pulmonary provider by Biagio Borg, MD  Subjective:   PATIENT ID: [name]: female DOB: November 24, 1941, MRN: [id number]  Chief Complaint  Patient presents with   Shortness of Breath         This is an 81 year old female longstanding history of smoking, quit 1996, has moderate COPD with evidence of emphysema at baseline.  She has a small hiatal hernia, hyperlipidemia, hypertension, PAF.  Previous imaging for lung nodule showed stability.  However last axial CT imaging of the chest was in 2018 which revealed a bilateral lower lobe groundglass subsolid type lesion.  She is not had images for follow-up since then.  We reviewed these images today in the office.  From respiratory standpoint her COPD is managed with Trelegy 100.  She does okay with as needed albuterol as well.  Able to complete most activities of daily living she.  She does ambulate with a cane.  OV 12/31/2021: Initially saw me for some solid pulmonary nodules.  Also has severe COPD on oxygen therapy Trelegy with a triple therapy inhaler.  Has current work-up with her cardiologist.  She has mitral valve disease which is severe regurgitation.  She also has a reduced ejection fraction on diuretics.  She has follow-up to see them next week.  She just had a TEE completed this past week.   Past Medical History:  Diagnosis Date   Allergic rhinitis    Anxiety    Atrial septal aneurysm    Basal cell carcinoma    Bradycardia    Cataract    bil cataracts removed   Chronic combined systolic and diastolic CHF (congestive heart failure) (HCC)    CKD (chronic kidney disease), stage III (HCC)    Clostridium difficile infection    COPD (chronic obstructive pulmonary disease) ([place])    Depression 11/29/2013   DI (detrusor instability)    Esophageal stricture    Hemorrhoids    Hiatal hernia    History of kidney stones    HLD (hyperlipidemia)    HTN (hypertension)     Ischemic colon ([place])    a. 08/2016 - adm to  [facility] with a ruptured colon s/p right hemicolectomy with ileostomy formation for ischemic and necrotic colon.    Lung nodule    a. followed by pulmonology   Mild CAD    a. minor nonobstructive CAD 08/22/16.   Nephrolithiasis    hx   NICM (nonischemic cardiomyopathy) (HCC)    Orthostatic hypotension    Osteoarthritis    PAF (paroxysmal atrial fibrillation) ([place])    a. diagnosed 07/2016 s/p DCCV 08/01/16 with recurrence, managed with rate control for now.    Pneumonia 2013   PVC's (premature ventricular contractions)    Shingles since Nov 18, 2013   on right arm from shoulder to wrist   Status post dilation of esophageal narrowing    Stroke [facility]) 2001   stroke affected left leg   Syncope    a. 08/2016 felt due to orthostatic hypotension.   TIA (transient ischemic attack) last 2001   anticoagulation therapy on coumadin     Family History  Problem Relation Age of Onset   COPD Mother    Breast cancer Mother        mid 73's   Uterine cancer Mother    Emphysema Mother    Breast cancer Maternal Aunt        x 3 aunts, post menopausal  Breast cancer Maternal Aunt    Breast cancer Maternal Aunt    Colon cancer Neg Hx    Esophageal cancer Neg Hx    Rectal cancer Neg Hx    Stomach cancer Neg Hx    Pancreatic cancer Neg Hx      Past Surgical History:  Procedure Laterality Date   ANTERIOR LATERAL LUMBAR FUSION 4 LEVELS Right 07/03/2015   Procedure: Right Lumbar one-two, Lumbar two-three, Lumbar three-four, Lumbar four-five Anterior lateral lumbar interbody fusion;  Surgeon: Erline Levine, MD;  Location: [place] [facility];  Service: Neurosurgery;  Laterality: Right;  Right L1-2 L2-3 L3-4 L4-5 Anterior lateral lumbar interbody fusion   BALLOON DILATION N/A 03/03/2014   Procedure: BALLOON DILATION;  Surgeon: Inda Castle, MD;  Location: [facility];  Service: Endoscopy;  Laterality: N/A;   BLADDER SUSPENSION     BREAST BIOPSY  Right 07/03/2006   BUBBLE STUDY  12/28/2021   Procedure: BUBBLE STUDY;  Surgeon: Berniece Salines, DO;  Location: [place];  Service: Cardiovascular;;   CARDIAC CATHETERIZATION N/A 08/22/2016   Procedure: Left Heart Cath and Coronary Angiography;  Surgeon: Peter M Martinique, MD;  Location: [place] [facility];  Service: Cardiovascular;  Laterality: N/A;   CARDIOVERSION N/A 08/01/2016   Procedure: CARDIOVERSION;  Surgeon: Jerline Pain, MD;  Location: [place];  Service: Cardiovascular;  Laterality: N/A;   CARDIOVERSION N/A 10/11/2016   Procedure: CARDIOVERSION;  Surgeon: Sueanne Margarita, MD;  Location: [place] [facility];  Service: Cardiovascular;  Laterality: N/A;   COLONOSCOPY     CYSTOSCOPY WITH RETROGRADE PYELOGRAM, URETEROSCOPY AND STENT PLACEMENT Left 11/04/2021   Procedure: CYSTOSCOPY WITH RETROGRADE PYELOGRAM, URETEROSCOPY  HOLMIUM LASER AND STENT PLACEMENT;  Surgeon: Ardis Hughs, MD;  [facility]: [facility];  Service: Urology;  Laterality: Left;   ESOPHAGOGASTRODUODENOSCOPY N/A 03/03/2014   Procedure: ESOPHAGOGASTRODUODENOSCOPY (EGD);  Surgeon: Inda Castle, MD;  Location: [facility];  Service: Endoscopy;  Laterality: N/A;   EXCISION OF BASAL CELL CA  2011   FINGER SURGERY     GLUTEUS MINIMUS REPAIR Right 03/15/2016   Procedure: RIGHT HIP GLUTEUS MEDIUS TENDON REPAIR;  Surgeon: Paralee Cancel, MD;  [facility]: [facility];  Service: Orthopedics;  Laterality: Right;   ileostomy reversal     LUMBAR PERCUTANEOUS PEDICLE SCREW 4 LEVEL Right 07/03/2015   Procedure: Lumbar one-Sacral one Bilateral percutaneous pedicle screws;  Surgeon: Erline Levine, MD;  Location: [place] [facility];  Service: Neurosurgery;  Laterality: Right;  L1-S1 Bilateral percutaneous pedicle screws   RECTOCELE REPAIR  2008   A&P REPAIR with vaginal repair-DR. MCDIARMID   TEE WITHOUT CARDIOVERSION N/A 12/28/2021   Procedure: TRANSESOPHAGEAL ECHOCARDIOGRAM (TEE);  Surgeon: Berniece Salines, DO;  Location: [place];  Service:  Cardiovascular;  Laterality: N/A;   THUMB SURGERY Right 10-15 yrs ago   [place]   NO BSO-DR. MABRY   VARICOSE VEIN SURGERY      Social History   Socioeconomic History   Marital status: Married    Spouse name: Shanon Brow   Number of children: 2   Years of education: Not on file   Highest education level: Not on file  Occupational History   Occupation: retired    Fish farm manager: RETIRED  Tobacco Use   Smoking status: Former    Packs/day: 3.00    Years: 42.00    Pack years: 126.00    Types: Cigarettes    Start date: 08/04/1954    Quit date: 12/06/1995    Years  since quitting: 26.0   Smokeless tobacco: Never  Vaping Use   Vaping Use: Never used  Substance and Sexual Activity   Alcohol use: Never   Drug use: Never   Sexual activity: Not Currently    Birth control/protection: Surgical    Comment: 1st intercourse 81 yo-Fewer than 5 partners  Other Topics Concern   Not on file  Social History Narrative   Retired- Investment banker, operational.       Financial controller Pulmonary:   Originally from [place]. Has always lived in [place]. She has traveled to [place], [place], [place], [place], [place], [place]. Has been on multiple cruises. 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 She did use a face mask consistently to avoid chemical fume exposure. She may have only had an inhaled exposure a couple of times. She currently has a dog. Remote exposure to a parakeet. No mold or hot tub exposure. No asbestos exposure.    Social Determinants of Health   Financial Resource Strain: Low Risk    Difficulty of Paying Living Expenses: Not hard at all  Food Insecurity: No Food Insecurity   Worried About Charity fundraiser in the Last Year: Never true   [place] in the Last Year: Never true  Transportation Needs: No Transportation Needs   Lack of Transportation (Medical): No   Lack of  Transportation (Non-Medical): No  Physical Activity: Sufficiently Active   Days of Exercise per Week: 5 days   Minutes of Exercise per Session: 30 min  Stress: No Stress Concern Present   Feeling of Stress : Not at all  Social Connections: Socially Integrated   Frequency of Communication with Friends and Family: More than three times a week   Frequency of Social Gatherings with Friends and Family: More than three times a week   Attends Religious Services: More than 4 times per year   Active Member of [employer] or Organizations: Yes   Attends Music therapist: More than 4 times per year   Marital Status: Married  Human resources officer Violence: Not At Risk   Fear of Current or Ex-Partner: No   Emotionally Abused: No   Physically Abused: No   Sexually Abused: No     Allergies  Allergen Reactions   Clarithromycin Other (See Comments)    REACTION: possible rash but could have been legionarres dz with rash   Lipitor [Atorvastatin] Other (See Comments)    MUSCLE SPASMS   Simvastatin Other (See Comments)    Muscle and joint pain   Cardizem [Diltiazem Hcl] Hives   Contrast Media [Iodinated Contrast Media]     FYI - during admission at [facility] 08/2016 patient developed hives, question related to cardizem or contrast dye - not clear   Diltiazem     Other reaction(s): rash   Statins     Other reaction(s): leg cramps   Rosuvastatin Calcium Other (See Comments)    fagitue and joint pain, "NO STATINS"     Outpatient Medications Prior to Visit  Medication Sig Dispense Refill   albuterol (VENTOLIN HFA) 108 (90 Base) MCG/ACT inhaler Inhale 1-2 puffs into the lungs every 6 (six) hours as needed for wheezing or shortness of breath. 18 g 3   benzonatate (TESSALON) 200 MG capsule Take 1 capsule (200 mg total) by mouth 3 (three) times daily as needed for cough. 30 capsule 1   Biotin 5 MG CAPS Take 5 mg by mouth daily.  buPROPion (WELLBUTRIN XL) 300 MG 24 hr tablet Take 1 tablet  (300 mg total) by mouth daily. 90 tablet 0   Calcium Carbonate-Vitamin D (CALCIUM + D PO) Take 1 tablet by mouth 2 (two) times daily.     colestipol (COLESTID) 1 g tablet Take 1 g by mouth 2 (two) times daily.     cyclobenzaprine (FLEXERIL) 5 MG tablet Take 1 tablet (5 mg total) by mouth 3 (three) times daily as needed for muscle spasms. 270 tablet 1   ezetimibe (ZETIA) 10 MG tablet Take 1 tablet (10 mg total) by mouth daily. 90 tablet 0   ferrous sulfate 325 (65 FE) MG tablet Take 325 mg by mouth daily with breakfast.     fluticasone (FLONASE) 50 MCG/ACT nasal spray Place 1 spray into both nostrils 2 (two) times daily. 48 g 1   Fluticasone-Umeclidin-Vilant (TRELEGY ELLIPTA) 100-62.5-25 MCG/INH AEPB Inhale 1 puff into the lungs daily. I inhalation once daily 180 each 1   furosemide (LASIX) 20 MG tablet Take 2 tablets (40 mg total) by mouth daily. 90 tablet 1   gabapentin (NEURONTIN) 300 MG capsule 1 tab by mouth in AM, 1 po at midday, then 2 by mouth at bedtime for sleep and pain (Patient taking differently: Take 300-600 mg by mouth See admin instructions. Take 300 mg by mouth in the morning and 600 mg at night) 360 capsule 1   guaiFENesin (MUCINEX) 600 MG 12 hr tablet Take 1,200 mg by mouth daily.     hydroxychloroquine (PLAQUENIL) 200 MG tablet Take 400 mg by mouth at bedtime.     ipratropium (ATROVENT) 0.03 % nasal spray Place 2 sprays into both nostrils 3 (three) times daily as needed for rhinitis. 90 mL 1   ipratropium-albuterol (DUONEB) 0.5-2.5 (3) MG/3ML SOLN Take 3 mLs by nebulization every 6 (six) hours as needed (shortness of breath or wheezing). 360 mL 3   levothyroxine (SYNTHROID) 25 MCG tablet Take 1 tablet (25 mcg total) by mouth daily before breakfast. Annual appt due in Nov must see provider for future refills 90 tablet 3   Lidocaine HCl 2 % CREA Apply 1 application topically daily as needed (for shingles flares).     loratadine (CLARITIN) 10 MG tablet Take 10 mg by mouth daily.      Melatonin 10 MG TABS Take 10 mg by mouth at bedtime.     metoprolol succinate (TOPROL-XL) 100 MG 24 hr tablet Take 1 tablet (100 mg total) by mouth daily. Take with or immediately following a meal. (Patient taking differently: Take 100 mg by mouth every evening. Take with or immediately following a meal.) 90 tablet 3   mirabegron ER (MYRBETRIQ) 50 MG TB24 tablet Take 50 mg by mouth at bedtime.     montelukast (SINGULAIR) 10 MG tablet Take 1 tablet (10 mg total) by mouth daily. Annual appt due in Nov must see provider for future refills 90 tablet 1   Multiple Vitamin (MULTIVITAMIN) tablet Take 2 tablets by mouth daily.     omeprazole (PRILOSEC) 40 MG capsule Take 1 capsule (40 mg total) by mouth daily. (Patient taking differently: Take 40 mg by mouth every evening.) 90 capsule 3   Probiotic Product (PROBIOTIC DAILY PO) Take 1 capsule by mouth in the morning and at bedtime.     promethazine-dextromethorphan (PROMETHAZINE-DM) 6.25-15 MG/5ML syrup Take 2.5 mLs by mouth 4 (four) times daily as needed for cough. 118 mL 0   traZODone (DESYREL) 50 MG tablet Take 1 tablet (50 mg total)  by mouth at bedtime and may repeat dose one time if needed. (Patient taking differently: Take 100 mg by mouth at bedtime.) 180 tablet 1   furosemide (LASIX) 40 MG tablet Take 1 tablet (40 mg total) by mouth daily. (Patient not taking: Reported on 12/23/2021) 30 tablet 0   Facility-Administered Medications Prior to Visit  Medication Dose Route Frequency Provider Last Rate Last Admin   ipratropium-albuterol (DUONEB) 0.5-2.5 (3) MG/3ML nebulizer solution 3 mL  3 mL Nebulization Once Cobb, Karie Schwalbe, NP       methylPREDNISolone acetate (DEPO-MEDROL) injection 80 mg  80 mg Intramuscular Once Cobb, Karie Schwalbe, NP        Review of Systems  Constitutional:  Negative for chills, fever, malaise/fatigue and weight loss.  HENT:  Negative for hearing loss, sore throat and tinnitus.   Eyes:  Negative for blurred vision and double  vision.  Respiratory:  Positive for shortness of breath. Negative for cough, hemoptysis, sputum production, wheezing and stridor.   Cardiovascular:  Negative for chest pain, palpitations, orthopnea, leg swelling and PND.  Gastrointestinal:  Negative for abdominal pain, constipation, diarrhea, heartburn, nausea and vomiting.  Genitourinary:  Negative for dysuria, hematuria and urgency.  Musculoskeletal:  Negative for joint pain and myalgias.  Skin:  Negative for itching and rash.  Neurological:  Negative for dizziness, tingling, weakness and headaches.  Endo/Heme/Allergies:  Negative for environmental allergies. Does not bruise/bleed easily.  Psychiatric/Behavioral:  Negative for depression. The patient is not nervous/anxious and does not have insomnia.   All other systems reviewed and are negative.   Objective:  Physical Exam Vitals reviewed.  Constitutional:      General: She is not in acute distress.    Appearance: She is well-developed.  HENT:     Head: Normocephalic and atraumatic.  Eyes:     Conjunctiva/sclera: Conjunctivae normal.     Pupils: Pupils are equal, round, and reactive to light.  Neck:     Vascular: No JVD.     Trachea: No tracheal deviation.  Cardiovascular:     Rate and Rhythm: Normal rate and regular rhythm.     Heart sounds: Murmur heard.  Pulmonary:     Effort: Pulmonary effort is normal. No tachypnea, accessory muscle usage or respiratory distress.     Breath sounds: No stridor. No wheezing, rhonchi or rales.  Abdominal:     General: There is no distension.     Palpations: Abdomen is soft.     Tenderness: There is no abdominal tenderness.  Musculoskeletal:        General: No tenderness.     Cervical back: Neck supple.     Right lower leg: Edema present.     Left lower leg: Edema present.     Comments: Trace bilateral lower extremity edema  Lymphadenopathy:     Cervical: No cervical adenopathy.  Skin:    General: Skin is warm and dry.     Capillary  Refill: Capillary refill takes less than 2 seconds.     Findings: No rash.  Neurological:     Mental Status: She is alert and oriented to person, place, and time.  Psychiatric:        Behavior: Behavior normal.     Vitals:   12/31/21 1525  BP: (!) 80/52  Pulse: (!) 103  Temp: 97.7 F (36.5 C)  TempSrc: Oral  SpO2: 98%  Weight: 123 lb 3.2 oz (55.9 kg)  Height: 5\' 3"  (1.6 m)   98% on RA BMI Readings from  Last 3 Encounters:  12/31/21 21.82 kg/m  12/28/21 21.63 kg/m  12/17/21 21.63 kg/m   Wt Readings from Last 3 Encounters:  12/31/21 123 lb 3.2 oz (55.9 kg)  12/28/21 126 lb (57.2 kg)  12/17/21 126 lb (57.2 kg)        Component Value Date/Time   WBC 4.9 10/26/2021 1018   RBC 4.49 10/26/2021 1018   HGB 12.0 10/26/2021 1018   HCT 37.9 10/26/2021 1018   PLT 164 10/26/2021 1018   MCV 84.4 10/26/2021 1018   MCH 26.7 10/26/2021 1018   MCHC 31.7 10/26/2021 1018   RDW 13.6 10/26/2021 1018   LYMPHSABS 1.4 05/13/2021 1025   MONOABS 0.9 05/13/2021 1025   EOSABS 0.1 05/13/2021 1025   BASOSABS 0.1 05/13/2021 1025      Chest Imaging:  CT chest 2018: Bilateral lower lobe groundglass of solid type lesions that were noted at that time.  Also right-sided nodule stable. The patient's images have been independently reviewed by me.    Pulmonary Functions Testing Results: PFT Results Latest Ref Rng & Units 10/10/2016 03/01/2016  FVC-Pre L 1.94 2.55  FVC-Predicted Pre % 70 91  FVC-Post L 2.17 2.72  FVC-Predicted Post % 78 97  Pre FEV1/FVC % % 48 51  Post FEV1/FCV % % 51 53  FEV1-Pre L 0.93 1.30  FEV1-Predicted Pre % 45 62  FEV1-Post L 1.11 1.44  DLCO uncorrected ml/min/mmHg - 16.22  DLCO UNC% % - 65  DLCO corrected ml/min/mmHg - 15.47  DLCO COR %Predicted % - 62  DLVA Predicted % - 71  TLC L - 5.66  TLC % Predicted % - 111  RV % Predicted % - 116    FeNO:   Pathology:   Echocardiogram:   Heart Catheterization:     Assessment & Plan:     ICD-10-CM   1.  Chronic respiratory failure with hypoxia (HCC)  J96.11     2. Combined systolic and diastolic congestive heart failure, unspecified HF chronicity ([place])  I50.40     3. Centrilobular emphysema ([place])  J43.2     4. Lung nodule  R91.1     5. Ground glass opacity present on imaging of lung  R91.8     6. History of COVID-19  Z86.16     7. Severe mitral regurgitation  I34.0     8. PFO (patent foramen ovale)  Q21.12        Discussion:  This is an 81 year old female, moderate COPD, evidence of centrilobular emphysema on imaging.  She has a pulmonary nodule with a repeat CT scan scheduled for March 2023.  She also has systolic diastolic heart failure, chronic respiratory failure on oxygen, recent history of COVID-19.  TEE with severe mitral regurgitation and PFO.  Plan: Follow-up with cardiology as planned. Her Lasix was just increased her blood pressure is a little bit soft today. She is asymptomatic from BP standpoint she is got a monitor that for [place] and recheck it again this evening and tomorrow. She may need to back off on her Lasix if her BP remains low.  I told her to reach out to cardiology office let him know. From a COPD standpoint she needs to stay on triple therapy inhaler. Continue albuterol for shortness of breath and wheezing.  Return to clinic to see me or Roxan Diesel, NP in 4 weeks.   Current Outpatient Medications:    albuterol (VENTOLIN HFA) 108 (90 Base) MCG/ACT inhaler, Inhale 1-2 puffs into the lungs every 6 (six)  hours as needed for wheezing or shortness of breath., Disp: 18 g, Rfl: 3   benzonatate (TESSALON) 200 MG capsule, Take 1 capsule (200 mg total) by mouth 3 (three) times daily as needed for cough., Disp: 30 capsule, Rfl: 1   Biotin 5 MG CAPS, Take 5 mg by mouth daily., Disp: , Rfl:    buPROPion (WELLBUTRIN XL) 300 MG 24 hr tablet, Take 1 tablet (300 mg total) by mouth daily., Disp: 90 tablet, Rfl: 0   Calcium Carbonate-Vitamin D (CALCIUM + D PO), Take 1 tablet  by mouth 2 (two) times daily., Disp: , Rfl:    colestipol (COLESTID) 1 g tablet, Take 1 g by mouth 2 (two) times daily., Disp: , Rfl:    cyclobenzaprine (FLEXERIL) 5 MG tablet, Take 1 tablet (5 mg total) by mouth 3 (three) times daily as needed for muscle spasms., Disp: 270 tablet, Rfl: 1   ezetimibe (ZETIA) 10 MG tablet, Take 1 tablet (10 mg total) by mouth daily., Disp: 90 tablet, Rfl: 0   ferrous sulfate 325 (65 FE) MG tablet, Take 325 mg by mouth daily with breakfast., Disp: , Rfl:    fluticasone (FLONASE) 50 MCG/ACT nasal spray, Place 1 spray into both nostrils 2 (two) times daily., Disp: 48 g, Rfl: 1   Fluticasone-Umeclidin-Vilant (TRELEGY ELLIPTA) 100-62.5-25 MCG/INH AEPB, Inhale 1 puff into the lungs daily. I inhalation once daily, Disp: 180 each, Rfl: 1   furosemide (LASIX) 20 MG tablet, Take 2 tablets (40 mg total) by mouth daily., Disp: 90 tablet, Rfl: 1   gabapentin (NEURONTIN) 300 MG capsule, 1 tab by mouth in AM, 1 po at midday, then 2 by mouth at bedtime for sleep and pain (Patient taking differently: Take 300-600 mg by mouth See admin instructions. Take 300 mg by mouth in the morning and 600 mg at night), Disp: 360 capsule, Rfl: 1   guaiFENesin (MUCINEX) 600 MG 12 hr tablet, Take 1,200 mg by mouth daily., Disp: , Rfl:    hydroxychloroquine (PLAQUENIL) 200 MG tablet, Take 400 mg by mouth at bedtime., Disp: , Rfl:    ipratropium (ATROVENT) 0.03 % nasal spray, Place 2 sprays into both nostrils 3 (three) times daily as needed for rhinitis., Disp: 90 mL, Rfl: 1   ipratropium-albuterol (DUONEB) 0.5-2.5 (3) MG/3ML SOLN, Take 3 mLs by nebulization every 6 (six) hours as needed (shortness of breath or wheezing)., Disp: 360 mL, Rfl: 3   levothyroxine (SYNTHROID) 25 MCG tablet, Take 1 tablet (25 mcg total) by mouth daily before breakfast. Annual appt due in Nov must see provider for future refills, Disp: 90 tablet, Rfl: 3   Lidocaine HCl 2 % CREA, Apply 1 application topically daily as needed (for  shingles flares)., Disp: , Rfl:    loratadine (CLARITIN) 10 MG tablet, Take 10 mg by mouth daily., Disp: , Rfl:    Melatonin 10 MG TABS, Take 10 mg by mouth at bedtime., Disp: , Rfl:    metoprolol succinate (TOPROL-XL) 100 MG 24 hr tablet, Take 1 tablet (100 mg total) by mouth daily. Take with or immediately following a meal. (Patient taking differently: Take 100 mg by mouth every evening. Take with or immediately following a meal.), Disp: 90 tablet, Rfl: 3   mirabegron ER (MYRBETRIQ) 50 MG TB24 tablet, Take 50 mg by mouth at bedtime., Disp: , Rfl:    montelukast (SINGULAIR) 10 MG tablet, Take 1 tablet (10 mg total) by mouth daily. Annual appt due in Nov must see provider for future refills, Disp: 90  tablet, Rfl: 1   Multiple Vitamin (MULTIVITAMIN) tablet, Take 2 tablets by mouth daily., Disp: , Rfl:    omeprazole (PRILOSEC) 40 MG capsule, Take 1 capsule (40 mg total) by mouth daily. (Patient taking differently: Take 40 mg by mouth every evening.), Disp: 90 capsule, Rfl: 3   Probiotic Product (PROBIOTIC DAILY PO), Take 1 capsule by mouth in the morning and at bedtime., Disp: , Rfl:    promethazine-dextromethorphan (PROMETHAZINE-DM) 6.25-15 MG/5ML syrup, Take 2.5 mLs by mouth 4 (four) times daily as needed for cough., Disp: 118 mL, Rfl: 0   traZODone (DESYREL) 50 MG tablet, Take 1 tablet (50 mg total) by mouth at bedtime and may repeat dose one time if needed. (Patient taking differently: Take 100 mg by mouth at bedtime.), Disp: 180 tablet, Rfl: 1   furosemide (LASIX) 40 MG tablet, Take 1 tablet (40 mg total) by mouth daily. (Patient not taking: Reported on 12/23/2021), Disp: 30 tablet, Rfl: 0  Current Facility-Administered Medications:    ipratropium-albuterol (DUONEB) 0.5-2.5 (3) MG/3ML nebulizer solution 3 mL, 3 mL, Nebulization, Once, Cobb, Karie Schwalbe, NP   methylPREDNISolone acetate (DEPO-MEDROL) injection 80 mg, 80 mg, Intramuscular, Once, Cobb, Karie Schwalbe, NP    Garner Nash, DO [place]  Pulmonary Critical Care 12/31/2021 3:51 PM

## 2021-12-31 NOTE — Patient Instructions (Signed)
Thank you for visiting Dr. Valeta Harms at [facility]. Today we recommend the following:  Follow up with cardiology  Continue o2 supplementation  Continue current inhaler regimen See [place] back after your ct in march.   Return in about 6 weeks (around 02/11/2022) for w/ Roxan Diesel, NP.    Please do your part to reduce the spread of COVID-19.

## 2022-01-03 ENCOUNTER — Emergency Department (HOSPITAL_COMMUNITY): Payer: Medicare Other

## 2022-01-03 ENCOUNTER — Inpatient Hospital Stay (HOSPITAL_COMMUNITY)
Admission: EM | Admit: 2022-01-03 | Discharge: 2022-02-02 | DRG: 266 | Disposition: E | Payer: Medicare Other | Attending: Internal Medicine | Admitting: Internal Medicine

## 2022-01-03 ENCOUNTER — Telehealth: Payer: Self-pay | Admitting: Physician Assistant

## 2022-01-03 ENCOUNTER — Other Ambulatory Visit: Payer: Self-pay

## 2022-01-03 DIAGNOSIS — R0602 Shortness of breath: Secondary | ICD-10-CM | POA: Diagnosis present

## 2022-01-03 DIAGNOSIS — Z9889 Other specified postprocedural states: Secondary | ICD-10-CM

## 2022-01-03 DIAGNOSIS — I340 Nonrheumatic mitral (valve) insufficiency: Secondary | ICD-10-CM

## 2022-01-03 DIAGNOSIS — E785 Hyperlipidemia, unspecified: Secondary | ICD-10-CM | POA: Diagnosis present

## 2022-01-03 DIAGNOSIS — Z933 Colostomy status: Secondary | ICD-10-CM

## 2022-01-03 DIAGNOSIS — Z20822 Contact with and (suspected) exposure to covid-19: Secondary | ICD-10-CM | POA: Diagnosis not present

## 2022-01-03 DIAGNOSIS — Z681 Body mass index (BMI) 19 or less, adult: Secondary | ICD-10-CM | POA: Diagnosis not present

## 2022-01-03 DIAGNOSIS — J811 Chronic pulmonary edema: Secondary | ICD-10-CM

## 2022-01-03 DIAGNOSIS — E43 Unspecified severe protein-calorie malnutrition: Secondary | ICD-10-CM | POA: Insufficient documentation

## 2022-01-03 DIAGNOSIS — I4819 Other persistent atrial fibrillation: Secondary | ICD-10-CM | POA: Diagnosis present

## 2022-01-03 DIAGNOSIS — J449 Chronic obstructive pulmonary disease, unspecified: Secondary | ICD-10-CM | POA: Diagnosis present

## 2022-01-03 DIAGNOSIS — Z79899 Other long term (current) drug therapy: Secondary | ICD-10-CM

## 2022-01-03 DIAGNOSIS — Z006 Encounter for examination for normal comparison and control in clinical research program: Secondary | ICD-10-CM | POA: Diagnosis not present

## 2022-01-03 DIAGNOSIS — R64 Cachexia: Secondary | ICD-10-CM | POA: Diagnosis present

## 2022-01-03 DIAGNOSIS — J9621 Acute and chronic respiratory failure with hypoxia: Secondary | ICD-10-CM | POA: Diagnosis not present

## 2022-01-03 DIAGNOSIS — D638 Anemia in other chronic diseases classified elsewhere: Secondary | ICD-10-CM | POA: Diagnosis present

## 2022-01-03 DIAGNOSIS — Q2112 Patent foramen ovale: Secondary | ICD-10-CM

## 2022-01-03 DIAGNOSIS — Z90710 Acquired absence of both cervix and uterus: Secondary | ICD-10-CM

## 2022-01-03 DIAGNOSIS — I10 Essential (primary) hypertension: Secondary | ICD-10-CM | POA: Diagnosis not present

## 2022-01-03 DIAGNOSIS — Z888 Allergy status to other drugs, medicaments and biological substances status: Secondary | ICD-10-CM

## 2022-01-03 DIAGNOSIS — N179 Acute kidney failure, unspecified: Secondary | ICD-10-CM | POA: Diagnosis present

## 2022-01-03 DIAGNOSIS — Z9049 Acquired absence of other specified parts of digestive tract: Secondary | ICD-10-CM

## 2022-01-03 DIAGNOSIS — Z87442 Personal history of urinary calculi: Secondary | ICD-10-CM

## 2022-01-03 DIAGNOSIS — Z8049 Family history of malignant neoplasm of other genital organs: Secondary | ICD-10-CM

## 2022-01-03 DIAGNOSIS — J180 Bronchopneumonia, unspecified organism: Secondary | ICD-10-CM | POA: Diagnosis not present

## 2022-01-03 DIAGNOSIS — F419 Anxiety disorder, unspecified: Secondary | ICD-10-CM | POA: Diagnosis present

## 2022-01-03 DIAGNOSIS — I2510 Atherosclerotic heart disease of native coronary artery without angina pectoris: Secondary | ICD-10-CM | POA: Diagnosis present

## 2022-01-03 DIAGNOSIS — I495 Sick sinus syndrome: Secondary | ICD-10-CM | POA: Diagnosis present

## 2022-01-03 DIAGNOSIS — I4891 Unspecified atrial fibrillation: Secondary | ICD-10-CM | POA: Diagnosis not present

## 2022-01-03 DIAGNOSIS — F32A Depression, unspecified: Secondary | ICD-10-CM | POA: Diagnosis not present

## 2022-01-03 DIAGNOSIS — R54 Age-related physical debility: Secondary | ICD-10-CM | POA: Diagnosis present

## 2022-01-03 DIAGNOSIS — I447 Left bundle-branch block, unspecified: Secondary | ICD-10-CM | POA: Diagnosis present

## 2022-01-03 DIAGNOSIS — N189 Chronic kidney disease, unspecified: Secondary | ICD-10-CM | POA: Diagnosis not present

## 2022-01-03 DIAGNOSIS — I130 Hypertensive heart and chronic kidney disease with heart failure and stage 1 through stage 4 chronic kidney disease, or unspecified chronic kidney disease: Secondary | ICD-10-CM | POA: Diagnosis not present

## 2022-01-03 DIAGNOSIS — R570 Cardiogenic shock: Secondary | ICD-10-CM | POA: Diagnosis not present

## 2022-01-03 DIAGNOSIS — Z803 Family history of malignant neoplasm of breast: Secondary | ICD-10-CM

## 2022-01-03 DIAGNOSIS — J90 Pleural effusion, not elsewhere classified: Secondary | ICD-10-CM | POA: Diagnosis not present

## 2022-01-03 DIAGNOSIS — J309 Allergic rhinitis, unspecified: Secondary | ICD-10-CM | POA: Diagnosis present

## 2022-01-03 DIAGNOSIS — Z825 Family history of asthma and other chronic lower respiratory diseases: Secondary | ICD-10-CM

## 2022-01-03 DIAGNOSIS — E873 Alkalosis: Secondary | ICD-10-CM | POA: Diagnosis not present

## 2022-01-03 DIAGNOSIS — Z79890 Hormone replacement therapy (postmenopausal): Secondary | ICD-10-CM

## 2022-01-03 DIAGNOSIS — J9601 Acute respiratory failure with hypoxia: Secondary | ICD-10-CM | POA: Diagnosis not present

## 2022-01-03 DIAGNOSIS — Z95818 Presence of other cardiac implants and grafts: Secondary | ICD-10-CM

## 2022-01-03 DIAGNOSIS — R627 Adult failure to thrive: Secondary | ICD-10-CM | POA: Diagnosis present

## 2022-01-03 DIAGNOSIS — N1832 Chronic kidney disease, stage 3b: Secondary | ICD-10-CM | POA: Diagnosis present

## 2022-01-03 DIAGNOSIS — I083 Combined rheumatic disorders of mitral, aortic and tricuspid valves: Secondary | ICD-10-CM | POA: Diagnosis not present

## 2022-01-03 DIAGNOSIS — J189 Pneumonia, unspecified organism: Secondary | ICD-10-CM | POA: Diagnosis not present

## 2022-01-03 DIAGNOSIS — Z85828 Personal history of other malignant neoplasm of skin: Secondary | ICD-10-CM

## 2022-01-03 DIAGNOSIS — I480 Paroxysmal atrial fibrillation: Secondary | ICD-10-CM | POA: Diagnosis not present

## 2022-01-03 DIAGNOSIS — Z01818 Encounter for other preprocedural examination: Secondary | ICD-10-CM

## 2022-01-03 DIAGNOSIS — Z981 Arthrodesis status: Secondary | ICD-10-CM | POA: Diagnosis not present

## 2022-01-03 DIAGNOSIS — R0603 Acute respiratory distress: Secondary | ICD-10-CM

## 2022-01-03 DIAGNOSIS — Z978 Presence of other specified devices: Secondary | ICD-10-CM

## 2022-01-03 DIAGNOSIS — I5023 Acute on chronic systolic (congestive) heart failure: Secondary | ICD-10-CM | POA: Diagnosis not present

## 2022-01-03 DIAGNOSIS — I959 Hypotension, unspecified: Secondary | ICD-10-CM

## 2022-01-03 DIAGNOSIS — R531 Weakness: Secondary | ICD-10-CM | POA: Diagnosis not present

## 2022-01-03 DIAGNOSIS — I428 Other cardiomyopathies: Secondary | ICD-10-CM | POA: Diagnosis present

## 2022-01-03 DIAGNOSIS — L8990 Pressure ulcer of unspecified site, unspecified stage: Secondary | ICD-10-CM | POA: Insufficient documentation

## 2022-01-03 DIAGNOSIS — Z91041 Radiographic dye allergy status: Secondary | ICD-10-CM

## 2022-01-03 DIAGNOSIS — D649 Anemia, unspecified: Secondary | ICD-10-CM | POA: Diagnosis not present

## 2022-01-03 DIAGNOSIS — K219 Gastro-esophageal reflux disease without esophagitis: Secondary | ICD-10-CM | POA: Diagnosis present

## 2022-01-03 DIAGNOSIS — Z515 Encounter for palliative care: Secondary | ICD-10-CM

## 2022-01-03 DIAGNOSIS — J301 Allergic rhinitis due to pollen: Secondary | ICD-10-CM

## 2022-01-03 DIAGNOSIS — Z66 Do not resuscitate: Secondary | ICD-10-CM | POA: Diagnosis not present

## 2022-01-03 DIAGNOSIS — E039 Hypothyroidism, unspecified: Secondary | ICD-10-CM | POA: Diagnosis present

## 2022-01-03 DIAGNOSIS — I700 Atherosclerosis of aorta: Secondary | ICD-10-CM | POA: Diagnosis not present

## 2022-01-03 DIAGNOSIS — Z8673 Personal history of transient ischemic attack (TIA), and cerebral infarction without residual deficits: Secondary | ICD-10-CM

## 2022-01-03 DIAGNOSIS — Z7901 Long term (current) use of anticoagulants: Secondary | ICD-10-CM

## 2022-01-03 DIAGNOSIS — R197 Diarrhea, unspecified: Secondary | ICD-10-CM | POA: Diagnosis not present

## 2022-01-03 DIAGNOSIS — I517 Cardiomegaly: Secondary | ICD-10-CM | POA: Diagnosis not present

## 2022-01-03 DIAGNOSIS — J432 Centrilobular emphysema: Secondary | ICD-10-CM

## 2022-01-03 DIAGNOSIS — Z86711 Personal history of pulmonary embolism: Secondary | ICD-10-CM

## 2022-01-03 DIAGNOSIS — Z881 Allergy status to other antibiotic agents status: Secondary | ICD-10-CM

## 2022-01-03 DIAGNOSIS — J9811 Atelectasis: Secondary | ICD-10-CM | POA: Diagnosis not present

## 2022-01-03 DIAGNOSIS — Z4659 Encounter for fitting and adjustment of other gastrointestinal appliance and device: Secondary | ICD-10-CM

## 2022-01-03 DIAGNOSIS — E7800 Pure hypercholesterolemia, unspecified: Secondary | ICD-10-CM

## 2022-01-03 DIAGNOSIS — Z4682 Encounter for fitting and adjustment of non-vascular catheter: Secondary | ICD-10-CM | POA: Diagnosis not present

## 2022-01-03 DIAGNOSIS — K529 Noninfective gastroenteritis and colitis, unspecified: Secondary | ICD-10-CM

## 2022-01-03 DIAGNOSIS — E871 Hypo-osmolality and hyponatremia: Secondary | ICD-10-CM | POA: Diagnosis not present

## 2022-01-03 DIAGNOSIS — Z9289 Personal history of other medical treatment: Secondary | ICD-10-CM

## 2022-01-03 DIAGNOSIS — I509 Heart failure, unspecified: Secondary | ICD-10-CM | POA: Diagnosis not present

## 2022-01-03 DIAGNOSIS — E1122 Type 2 diabetes mellitus with diabetic chronic kidney disease: Secondary | ICD-10-CM | POA: Diagnosis present

## 2022-01-03 DIAGNOSIS — D509 Iron deficiency anemia, unspecified: Secondary | ICD-10-CM | POA: Diagnosis present

## 2022-01-03 DIAGNOSIS — J9622 Acute and chronic respiratory failure with hypercapnia: Secondary | ICD-10-CM | POA: Diagnosis not present

## 2022-01-03 DIAGNOSIS — I5043 Acute on chronic combined systolic (congestive) and diastolic (congestive) heart failure: Secondary | ICD-10-CM | POA: Diagnosis not present

## 2022-01-03 DIAGNOSIS — Z95828 Presence of other vascular implants and grafts: Secondary | ICD-10-CM

## 2022-01-03 DIAGNOSIS — I272 Pulmonary hypertension, unspecified: Secondary | ICD-10-CM | POA: Diagnosis not present

## 2022-01-03 DIAGNOSIS — J810 Acute pulmonary edema: Secondary | ICD-10-CM

## 2022-01-03 DIAGNOSIS — Z720 Tobacco use: Secondary | ICD-10-CM

## 2022-01-03 DIAGNOSIS — F418 Other specified anxiety disorders: Secondary | ICD-10-CM | POA: Diagnosis not present

## 2022-01-03 DIAGNOSIS — F40240 Claustrophobia: Secondary | ICD-10-CM | POA: Diagnosis present

## 2022-01-03 DIAGNOSIS — J439 Emphysema, unspecified: Secondary | ICD-10-CM | POA: Diagnosis not present

## 2022-01-03 DIAGNOSIS — E876 Hypokalemia: Secondary | ICD-10-CM | POA: Diagnosis not present

## 2022-01-03 DIAGNOSIS — Z954 Presence of other heart-valve replacement: Secondary | ICD-10-CM | POA: Diagnosis not present

## 2022-01-03 HISTORY — DX: Nonrheumatic mitral (valve) insufficiency: I34.0

## 2022-01-03 LAB — CBC WITH DIFFERENTIAL/PLATELET
Abs Immature Granulocytes: 0.02 10*3/uL (ref 0.00–0.07)
Basophils Absolute: 0 10*3/uL (ref 0.0–0.1)
Basophils Relative: 1 %
Eosinophils Absolute: 0.1 10*3/uL (ref 0.0–0.5)
Eosinophils Relative: 1 %
HCT: 34.8 % — ABNORMAL LOW (ref 36.0–46.0)
Hemoglobin: 10.8 g/dL — ABNORMAL LOW (ref 12.0–15.0)
Immature Granulocytes: 0 %
Lymphocytes Relative: 18 %
Lymphs Abs: 0.9 10*3/uL (ref 0.7–4.0)
MCH: 26.4 pg (ref 26.0–34.0)
MCHC: 31 g/dL (ref 30.0–36.0)
MCV: 85.1 fL (ref 80.0–100.0)
Monocytes Absolute: 0.7 10*3/uL (ref 0.1–1.0)
Monocytes Relative: 14 %
Neutro Abs: 3.4 10*3/uL (ref 1.7–7.7)
Neutrophils Relative %: 66 %
Platelets: 154 10*3/uL (ref 150–400)
RBC: 4.09 MIL/uL (ref 3.87–5.11)
RDW: 16.2 % — ABNORMAL HIGH (ref 11.5–15.5)
WBC: 5.2 10*3/uL (ref 4.0–10.5)
nRBC: 0 % (ref 0.0–0.2)

## 2022-01-03 LAB — COMPREHENSIVE METABOLIC PANEL
ALT: 18 U/L (ref 0–44)
AST: 19 U/L (ref 15–41)
Albumin: 3.2 g/dL — ABNORMAL LOW (ref 3.5–5.0)
Alkaline Phosphatase: 88 U/L (ref 38–126)
Anion gap: 10 (ref 5–15)
BUN: 30 mg/dL — ABNORMAL HIGH (ref 8–23)
CO2: 27 mmol/L (ref 22–32)
Calcium: 8.8 mg/dL — ABNORMAL LOW (ref 8.9–10.3)
Chloride: 98 mmol/L (ref 98–111)
Creatinine, Ser: 1.73 mg/dL — ABNORMAL HIGH (ref 0.44–1.00)
GFR, Estimated: 29 mL/min — ABNORMAL LOW (ref >60)
Glucose, Bld: 94 mg/dL (ref 70–99)
Potassium: 4.1 mmol/L (ref 3.5–5.1)
Sodium: 135 mmol/L (ref 135–145)
Total Bilirubin: 0.4 mg/dL (ref 0.3–1.2)
Total Protein: 5.4 g/dL — ABNORMAL LOW (ref 6.5–8.1)

## 2022-01-03 LAB — BRAIN NATRIURETIC PEPTIDE: B Natriuretic Peptide: 2812.3 pg/mL — ABNORMAL HIGH (ref 0.0–100.0)

## 2022-01-03 LAB — URINALYSIS, ROUTINE W REFLEX MICROSCOPIC
Bilirubin Urine: NEGATIVE
Glucose, UA: NEGATIVE mg/dL
Hgb urine dipstick: NEGATIVE
Ketones, ur: NEGATIVE mg/dL
Leukocytes,Ua: NEGATIVE
Nitrite: NEGATIVE
Protein, ur: NEGATIVE mg/dL
Specific Gravity, Urine: 1.025 (ref 1.005–1.030)
pH: 5.5 (ref 5.0–8.0)

## 2022-01-03 LAB — TROPONIN I (HIGH SENSITIVITY)
Troponin I (High Sensitivity): 13 ng/L (ref <18)
Troponin I (High Sensitivity): 13 ng/L (ref <18)

## 2022-01-03 LAB — TSH: TSH: 2.271 u[IU]/mL (ref 0.350–4.500)

## 2022-01-03 MED ORDER — ACETAMINOPHEN 650 MG RE SUPP: 650.0000 mg | Freq: Four times a day (QID) | RECTAL | Status: DC | PRN

## 2022-01-03 MED ORDER — ACETAMINOPHEN 325 MG PO TABS: 650.0000 mg | ORAL_TABLET | Freq: Four times a day (QID) | ORAL | Status: DC | PRN

## 2022-01-03 NOTE — Telephone Encounter (Signed)
Pts husband called to report that the pt has been declining over the oast few days especially..her HR has been dropping into the 30's and she has been unable to take care of herself without his assistance and she has not been able to get up at all without her O2 as she used to be able to recently... her BP today was 74/50 and HR 37.... she saw Dr. Valeta Harms 12/31/21 and her BP was 80/52... they increased her lasix several days ago to 40 mg daily due to fluid in her lungs... they have asked her to follow up with cardiology.... he says he is worried about her... she is not feeling well.  I spoke with Tommye Standard PA and after reviewing her TEE and hearing the pt symptoms and worsening symptoms... I have urged her husband to call EMS to assess her and take her to the ED for more immediate care.

## 2022-01-03 NOTE — ED Provider Notes (Signed)
[facility] EMERGENCY DEPARTMENT Provider Note   CSN: [id number] Arrival date & time: 12/17/2021  1702     History  Chief Complaint  Patient presents with   Atrial Fibrillation    [name] is a 81 y.o. female.   Atrial Fibrillation Associated symptoms include shortness of breath. Pertinent negatives include no chest pain and no abdominal pain. Patient presents with generalized weakness.  Has been feeling bad.  Shortness of breath.  History of both COPD and CHF/A. fib.  Recently has been seen by both cardiology and had TEE and recently seen by pulmonology and per the patient he thought it was from the patient's heart.  Has been more fatigued.  Difficult getting up.  Hypotensive for EMS.  Reportedly also pressures in the 80s at pulmonology.  No chest pain.  Somewhat decreased oral intake.  Thinks she is in A. fib.  History of same.  Is on anticoagulation.  No headache.  Has been able to get up and walk around.  Reportedly also was having heart rates that would go down to 30s.     Home Medications Prior to Admission medications   Medication Sig Start Date End Date Taking? Authorizing Provider  albuterol (VENTOLIN HFA) 108 (90 Base) MCG/ACT inhaler Inhale 1-2 puffs into the lungs every 6 (six) hours as needed for wheezing or shortness of breath. 12/08/20   Parrett, Fonnie Mu, NP  benzonatate (TESSALON) 200 MG capsule Take 1 capsule (200 mg total) by mouth 3 (three) times daily as needed for cough. 08/10/21 08/10/22  Parrett, Fonnie Mu, NP  Biotin 5 MG CAPS Take 5 mg by mouth daily.    [provider]  buPROPion (WELLBUTRIN XL) 300 MG 24 hr tablet Take 1 tablet (300 mg total) by mouth daily. 10/26/21   Biagio Borg, MD  Calcium Carbonate-Vitamin D (CALCIUM + D PO) Take 1 tablet by mouth 2 (two) times daily.    [provider]  colestipol (COLESTID) 1 g tablet Take 1 g by mouth 2 (two) times daily.    [provider]  cyclobenzaprine (FLEXERIL) 5 MG  tablet Take 1 tablet (5 mg total) by mouth 3 (three) times daily as needed for muscle spasms. 10/17/18   Biagio Borg, MD  ezetimibe (ZETIA) 10 MG tablet Take 1 tablet (10 mg total) by mouth daily. 10/26/21 01/24/22  Biagio Borg, MD  ferrous sulfate 325 (65 FE) MG tablet Take 325 mg by mouth daily with breakfast.    [provider]  fluticasone (FLONASE) 50 MCG/ACT nasal spray Place 1 spray into both nostrils 2 (two) times daily. 06/10/21   Parrett, Fonnie Mu, NP  Fluticasone-Umeclidin-Vilant (TRELEGY ELLIPTA) 100-62.5-25 MCG/INH AEPB Inhale 1 puff into the lungs daily. I inhalation once daily 12/08/20   Parrett, Fonnie Mu, NP  furosemide (LASIX) 20 MG tablet Take 2 tablets (40 mg total) by mouth daily. 12/17/21   Cobb, Karie Schwalbe, NP  furosemide (LASIX) 40 MG tablet Take 1 tablet (40 mg total) by mouth daily. Patient not taking: Reported on 12/23/2021 12/13/21   Clayton Bibles, NP  gabapentin (NEURONTIN) 300 MG capsule 1 tab by mouth in AM, 1 po at midday, then 2 by mouth at bedtime for sleep and pain Patient taking differently: Take 300-600 mg by mouth See admin instructions. Take 300 mg by mouth in the morning and 600 mg at night 06/17/21   Biagio Borg, MD  guaiFENesin (MUCINEX) 600 MG 12 hr tablet Take 1,200 mg  by mouth daily.    [provider]  hydroxychloroquine (PLAQUENIL) 200 MG tablet Take 400 mg by mouth at bedtime.    [provider]  ipratropium (ATROVENT) 0.03 % nasal spray Place 2 sprays into both nostrils 3 (three) times daily as needed for rhinitis. 06/10/21   Parrett, Fonnie Mu, NP  ipratropium-albuterol (DUONEB) 0.5-2.5 (3) MG/3ML SOLN Take 3 mLs by nebulization every 6 (six) hours as needed (shortness of breath or wheezing). 12/09/21   Cobb, Karie Schwalbe, NP  levothyroxine (SYNTHROID) 25 MCG tablet Take 1 tablet (25 mcg total) by mouth daily before breakfast. Annual appt due in Nov must see provider for future refills 12/24/20   Biagio Borg, MD  Lidocaine HCl 2 %  CREA Apply 1 application topically daily as needed (for shingles flares).    [provider]  loratadine (CLARITIN) 10 MG tablet Take 10 mg by mouth daily.    [provider]  Melatonin 10 MG TABS Take 10 mg by mouth at bedtime.    [provider]  metoprolol succinate (TOPROL-XL) 100 MG 24 hr tablet Take 1 tablet (100 mg total) by mouth daily. Take with or immediately following a meal. Patient taking differently: Take 100 mg by mouth every evening. Take with or immediately following a meal. 09/24/21   Camnitz, Ocie Doyne, MD  mirabegron ER (MYRBETRIQ) 50 MG TB24 tablet Take 50 mg by mouth at bedtime.    [provider]  montelukast (SINGULAIR) 10 MG tablet Take 1 tablet (10 mg total) by mouth daily. Annual appt due in Nov must see provider for future refills 11/05/21   Biagio Borg, MD  Multiple Vitamin (MULTIVITAMIN) tablet Take 2 tablets by mouth daily.    [provider]  omeprazole (PRILOSEC) 40 MG capsule Take 1 capsule (40 mg total) by mouth daily. Patient taking differently: Take 40 mg by mouth every evening. 05/28/21   Biagio Borg, MD  Probiotic Product (PROBIOTIC DAILY PO) Take 1 capsule by mouth in the morning and at bedtime.    [provider]  promethazine-dextromethorphan (PROMETHAZINE-DM) 6.25-15 MG/5ML syrup Take 2.5 mLs by mouth 4 (four) times daily as needed for cough. 12/09/21   Cobb, Karie Schwalbe, NP  traZODone (DESYREL) 50 MG tablet Take 1 tablet (50 mg total) by mouth at bedtime and may repeat dose one time if needed. Patient taking differently: Take 100 mg by mouth at bedtime. 09/24/21   Biagio Borg, MD      Allergies    Clarithromycin, Lipitor [atorvastatin], Simvastatin, Cardizem [diltiazem hcl], Contrast media [iodinated contrast media], Diltiazem, Statins, and Rosuvastatin calcium    Review of Systems   Review of Systems  Constitutional:  Positive for fatigue. Negative for appetite change.  Respiratory:  Positive  for shortness of breath.   Cardiovascular:  Positive for palpitations and leg swelling. Negative for chest pain.  Gastrointestinal:  Negative for abdominal pain.  Neurological:  Negative for weakness.   Physical Exam Updated Vital Signs BP 90/68    Pulse 87    Temp 98.4 F (36.9 C) (Oral)    Resp 13    SpO2 99%  Physical Exam Vitals and nursing note reviewed.  Eyes:     Pupils: Pupils are equal, round, and reactive to light.  Cardiovascular:     Rate and Rhythm: Normal rate. Rhythm irregular.  Pulmonary:     Breath sounds: No wheezing or rhonchi.  Abdominal:     Tenderness: There is no abdominal tenderness.  Musculoskeletal:  Cervical back: Neck supple.     Right lower leg: Edema present.     Left lower leg: Edema present.     Comments: Mild edema bilateral lower extremities.  Skin:    General: Skin is warm.  Neurological:     General: No focal deficit present.     Mental Status: She is alert.    ED Results / Procedures / Treatments   Labs (all labs ordered are listed, but only abnormal results are displayed) Labs Reviewed  COMPREHENSIVE METABOLIC PANEL - Abnormal; Notable for the following components:      Result Value   BUN 30 (*)    Creatinine, Ser 1.73 (*)    Calcium 8.8 (*)    Total Protein 5.4 (*)    Albumin 3.2 (*)    GFR, Estimated 29 (*)    All other components within normal limits  CBC WITH DIFFERENTIAL/PLATELET - Abnormal; Notable for the following components:   Hemoglobin 10.8 (*)    HCT 34.8 (*)    RDW 16.2 (*)    All other components within normal limits  BRAIN NATRIURETIC PEPTIDE - Abnormal; Notable for the following components:   B Natriuretic Peptide 2,812.3 (*)    All other components within normal limits  TSH  URINALYSIS, ROUTINE W REFLEX MICROSCOPIC  TROPONIN I (HIGH SENSITIVITY)  TROPONIN I (HIGH SENSITIVITY)    EKG EKG Interpretation  Date/Time:  Monday January 03 2022 17:15:14 EST Ventricular Rate:  108 PR Interval:    QRS  Duration: 139 QT Interval:  389 QTC Calculation: 522 R Axis:   -66 Text Interpretation: Atrial fibrillation IVCD, consider atypical LBBB Confirmed by Davonna Belling [phone]) on 12/09/2021 5:17:21 PM  Radiology DG Chest Portable 1 View  Result Date: 01/04/2022 CLINICAL DATA:  Short of breath, not feeling well for 2 weeks EXAM: PORTABLE CHEST 1 VIEW COMPARISON:  12/09/2021 FINDINGS: Single frontal view of the chest demonstrates a stable cardiac silhouette. Persistent bilateral pleural effusions, left greater than right, minimally increased since prior study. Increased consolidation at the left lung base may reflect airspace disease or atelectasis. Diffuse interstitial prominence is again noted, with slight increase in interstitial and ground-glass opacities at the lung bases consistent with worsening of volume status. No pneumothorax. No acute bony abnormality. IMPRESSION: 1. Worsening volume status, with developing bibasilar edema and slight enlargement of bilateral pleural effusions. Electronically Signed   By: Randa Ngo M.D.   On: 12/18/2021 17:59    Procedures Procedures    Medications Ordered in ED Medications - No data to display  ED Course/ Medical Decision Making/ A&P                           Medical Decision Making Problems Addressed: Atrial fibrillation, unspecified type [facility]): acute illness or injury that poses a threat to life or bodily functions Hypotension, unspecified hypotension type: acute illness or injury that poses a threat to life or bodily functions  Amount and/or Complexity of Data Reviewed External Data Reviewed: notes. Labs: ordered. Decision-making details documented in ED Course. Radiology: ordered and independent interpretation performed. Decision-making details documented in ED Course. Discussion of management or test interpretation with external provider(s): Cardiology, hospitalist  Risk Decision regarding hospitalization.   Patient presents with  generalized weakness shortness of breath.  History of both A. fib and COPD.  Does have some hypotension.  This has also been going on for a few days.  Has been hypertensive at pulmonary office.  Has had a TEE that did show mitral regurg.  Has worsening volume status.  Worsening edema and has bilateral pleural effusions.  Not hypoxic at this time but more dyspneic.  Creatinine has also increased.  Chest x-ray independently interpreted by me.  Patient also may be more symptomatic with her A. fib.  Reports of her heart rate going down to 30 but has not happened here.  However with worsening CHF and hypotension despite increasing home Lasix will admit to hospitalist.  Cardiology has been notified and will consult        Final Clinical Impression(s) / ED Diagnoses Final diagnoses:  Hypotension, unspecified hypotension type  Atrial fibrillation, unspecified type [facility])    Rx / DC Orders ED Discharge Orders     None         Davonna Belling, MD 12/13/2021 2240

## 2022-01-03 NOTE — Telephone Encounter (Signed)
Husband of patient called. He is concerned about the patient's decline in health. He took her BP this morning but all he could remember was that the top number was 74.  He said she is on oxygen and can not take it off for more than two minutes. She is also very weak and needs assistance walking everywhere.  He is concerned because she does not have an appointment to see Renee until next week. He is not sure what to do. He is just concerned  Please advise

## 2022-01-03 NOTE — Telephone Encounter (Signed)
Patient's husband returned call.

## 2022-01-03 NOTE — ED Triage Notes (Signed)
Pt bib [employer] from home. Pt c/o fluttering in her chest x2 days intermittently and increased in generalized weakness. Pt had a TEE a week ago. Pt does have hx of afib.   EMS vitals 86 pal BP 400CC fluid 98/56 BP 97% SpO2 89 HR

## 2022-01-03 NOTE — Telephone Encounter (Signed)
Left a message for the pt to call back.  

## 2022-01-03 NOTE — Telephone Encounter (Signed)
Left a message for the pts husband to call us back... pt saw Dr. Valeta Harms 12/31/21 and her BP was 80/52.

## 2022-01-03 NOTE — Telephone Encounter (Signed)
I spoke with Dr. Curt Bears re: the pt information and he agreed that the pt should call EMS for assessment and to be treated in the hospital.

## 2022-01-04 ENCOUNTER — Encounter (HOSPITAL_COMMUNITY): Payer: Self-pay | Admitting: Internal Medicine

## 2022-01-04 DIAGNOSIS — N179 Acute kidney failure, unspecified: Secondary | ICD-10-CM | POA: Diagnosis present

## 2022-01-04 DIAGNOSIS — E785 Hyperlipidemia, unspecified: Secondary | ICD-10-CM

## 2022-01-04 DIAGNOSIS — I5043 Acute on chronic combined systolic (congestive) and diastolic (congestive) heart failure: Secondary | ICD-10-CM

## 2022-01-04 DIAGNOSIS — I5023 Acute on chronic systolic (congestive) heart failure: Secondary | ICD-10-CM

## 2022-01-04 DIAGNOSIS — D649 Anemia, unspecified: Secondary | ICD-10-CM

## 2022-01-04 DIAGNOSIS — I480 Paroxysmal atrial fibrillation: Secondary | ICD-10-CM

## 2022-01-04 DIAGNOSIS — I340 Nonrheumatic mitral (valve) insufficiency: Secondary | ICD-10-CM

## 2022-01-04 LAB — CBC WITH DIFFERENTIAL/PLATELET
Abs Immature Granulocytes: 0.01 10*3/uL (ref 0.00–0.07)
Basophils Absolute: 0.1 10*3/uL (ref 0.0–0.1)
Basophils Relative: 1 %
Eosinophils Absolute: 0.1 10*3/uL (ref 0.0–0.5)
Eosinophils Relative: 1 %
HCT: 37 % (ref 36.0–46.0)
Hemoglobin: 11.1 g/dL — ABNORMAL LOW (ref 12.0–15.0)
Immature Granulocytes: 0 %
Lymphocytes Relative: 18 %
Lymphs Abs: 1.1 10*3/uL (ref 0.7–4.0)
MCH: 25.7 pg — ABNORMAL LOW (ref 26.0–34.0)
MCHC: 30 g/dL (ref 30.0–36.0)
MCV: 85.6 fL (ref 80.0–100.0)
Monocytes Absolute: 0.9 10*3/uL (ref 0.1–1.0)
Monocytes Relative: 14 %
Neutro Abs: 4.1 10*3/uL (ref 1.7–7.7)
Neutrophils Relative %: 66 %
Platelets: 174 10*3/uL (ref 150–400)
RBC: 4.32 MIL/uL (ref 3.87–5.11)
RDW: 16.3 % — ABNORMAL HIGH (ref 11.5–15.5)
WBC: 6.3 10*3/uL (ref 4.0–10.5)
nRBC: 0 % (ref 0.0–0.2)

## 2022-01-04 LAB — IRON AND TIBC
Iron: 26 ug/dL — ABNORMAL LOW (ref 28–170)
Saturation Ratios: 7 % — ABNORMAL LOW (ref 10.4–31.8)
TIBC: 375 ug/dL (ref 250–450)
UIBC: 349 ug/dL

## 2022-01-04 LAB — MAGNESIUM: Magnesium: 1.7 mg/dL (ref 1.7–2.4)

## 2022-01-04 LAB — COMPREHENSIVE METABOLIC PANEL
ALT: 19 U/L (ref 0–44)
AST: 19 U/L (ref 15–41)
Albumin: 3.5 g/dL (ref 3.5–5.0)
Alkaline Phosphatase: 92 U/L (ref 38–126)
Anion gap: 10 (ref 5–15)
BUN: 29 mg/dL — ABNORMAL HIGH (ref 8–23)
CO2: 27 mmol/L (ref 22–32)
Calcium: 8.9 mg/dL (ref 8.9–10.3)
Chloride: 101 mmol/L (ref 98–111)
Creatinine, Ser: 1.58 mg/dL — ABNORMAL HIGH (ref 0.44–1.00)
GFR, Estimated: 32 mL/min — ABNORMAL LOW (ref >60)
Glucose, Bld: 83 mg/dL (ref 70–99)
Potassium: 4.1 mmol/L (ref 3.5–5.1)
Sodium: 138 mmol/L (ref 135–145)
Total Bilirubin: 0.6 mg/dL (ref 0.3–1.2)
Total Protein: 5.8 g/dL — ABNORMAL LOW (ref 6.5–8.1)

## 2022-01-04 LAB — RESP PANEL BY RT-PCR (FLU A&B, COVID) ARPGX2
Influenza A by PCR: NEGATIVE
Influenza B by PCR: NEGATIVE
SARS Coronavirus 2 by RT PCR: NEGATIVE

## 2022-01-04 LAB — PHOSPHORUS: Phosphorus: 3.6 mg/dL (ref 2.5–4.6)

## 2022-01-04 LAB — SODIUM, URINE, RANDOM: Sodium, Ur: <10 mmol/L

## 2022-01-04 LAB — FERRITIN: Ferritin: 48 ng/mL (ref 11–307)

## 2022-01-04 LAB — CREATININE, URINE, RANDOM: Creatinine, Urine: 87.16 mg/dL

## 2022-01-04 MED ORDER — FUROSEMIDE 10 MG/ML IJ SOLN
80.0000 mg | Freq: Once | INTRAMUSCULAR | Status: AC
  Administered 2022-01-04: 80 mg via INTRAVENOUS
  Filled 2022-01-04: qty 8

## 2022-01-04 MED ORDER — LEVOTHYROXINE SODIUM 25 MCG PO TABS
25.0000 ug | ORAL_TABLET | Freq: Every day | ORAL | Status: DC
  Administered 2022-01-04 – 2022-01-12 (×9): 25 ug via ORAL
  Filled 2022-01-04 (×9): qty 1

## 2022-01-04 MED ORDER — PANTOPRAZOLE SODIUM 40 MG PO TBEC
40.0000 mg | DELAYED_RELEASE_TABLET | Freq: Every day | ORAL | Status: DC
  Administered 2022-01-04 – 2022-01-12 (×9): 40 mg via ORAL
  Filled 2022-01-04 (×9): qty 1

## 2022-01-04 MED ORDER — MELATONIN 5 MG PO TABS
10.0000 mg | ORAL_TABLET | Freq: Every evening | ORAL | Status: DC | PRN
  Administered 2022-01-04 – 2022-01-18 (×12): 10 mg via ORAL
  Filled 2022-01-04 (×12): qty 2

## 2022-01-04 MED ORDER — FUROSEMIDE 10 MG/ML IJ SOLN
20.0000 mg | Freq: Two times a day (BID) | INTRAMUSCULAR | Status: DC
  Administered 2022-01-04 – 2022-01-05 (×3): 20 mg via INTRAVENOUS
  Filled 2022-01-04 (×3): qty 2

## 2022-01-04 MED ORDER — BUPROPION HCL ER (XL) 150 MG PO TB24
300.0000 mg | ORAL_TABLET | Freq: Every day | ORAL | Status: DC
  Administered 2022-01-04 – 2022-01-12 (×9): 300 mg via ORAL
  Filled 2022-01-04 (×11): qty 1

## 2022-01-04 MED ORDER — ALBUTEROL SULFATE (2.5 MG/3ML) 0.083% IN NEBU
3.0000 mL | INHALATION_SOLUTION | Freq: Four times a day (QID) | RESPIRATORY_TRACT | Status: DC | PRN
  Administered 2022-01-06 (×2): 3 mL via RESPIRATORY_TRACT
  Filled 2022-01-04 (×2): qty 3

## 2022-01-04 MED ORDER — COLESTIPOL HCL 1 G PO TABS
1.0000 g | ORAL_TABLET | Freq: Two times a day (BID) | ORAL | Status: DC
  Administered 2022-01-04 – 2022-01-12 (×17): 1 g via ORAL
  Filled 2022-01-04 (×21): qty 1

## 2022-01-04 MED ORDER — EZETIMIBE 10 MG PO TABS
10.0000 mg | ORAL_TABLET | Freq: Every day | ORAL | Status: DC
  Administered 2022-01-05 – 2022-01-13 (×9): 10 mg via ORAL
  Filled 2022-01-04 (×9): qty 1

## 2022-01-04 MED ORDER — METOPROLOL SUCCINATE ER 25 MG PO TB24
100.0000 mg | ORAL_TABLET | Freq: Every evening | ORAL | Status: DC
Start: 2022-01-04 — End: 2022-01-04

## 2022-01-04 MED ORDER — METOPROLOL TARTRATE 12.5 MG HALF TABLET
12.5000 mg | ORAL_TABLET | Freq: Two times a day (BID) | ORAL | Status: DC
  Administered 2022-01-04 (×3): 12.5 mg via ORAL
  Filled 2022-01-04 (×3): qty 1

## 2022-01-04 MED ORDER — POTASSIUM CHLORIDE CRYS ER 20 MEQ PO TBCR
20.0000 meq | EXTENDED_RELEASE_TABLET | Freq: Once | ORAL | Status: AC
  Administered 2022-01-04: 20 meq via ORAL
  Filled 2022-01-04: qty 1

## 2022-01-04 MED ORDER — MONTELUKAST SODIUM 10 MG PO TABS
10.0000 mg | ORAL_TABLET | Freq: Every day | ORAL | Status: DC
  Administered 2022-01-04 – 2022-01-13 (×10): 10 mg via ORAL
  Filled 2022-01-04 (×11): qty 1

## 2022-01-04 MED ORDER — FLUTICASONE PROPIONATE 50 MCG/ACT NA SUSP
1.0000 | Freq: Two times a day (BID) | NASAL | Status: DC
  Administered 2022-01-04 – 2022-01-18 (×22): 1 via NASAL
  Filled 2022-01-04 (×2): qty 16

## 2022-01-04 MED ORDER — LORATADINE 10 MG PO TABS
10.0000 mg | ORAL_TABLET | Freq: Every day | ORAL | Status: DC
  Administered 2022-01-04 – 2022-01-13 (×10): 10 mg via ORAL
  Filled 2022-01-04 (×10): qty 1

## 2022-01-04 MED ORDER — HYDROXYCHLOROQUINE SULFATE 200 MG PO TABS
400.0000 mg | ORAL_TABLET | Freq: Every day | ORAL | Status: DC
  Administered 2022-01-04 – 2022-01-12 (×9): 400 mg via ORAL
  Filled 2022-01-04 (×10): qty 2

## 2022-01-04 MED ORDER — FERROUS SULFATE 325 (65 FE) MG PO TABS
325.0000 mg | ORAL_TABLET | Freq: Every day | ORAL | Status: DC
  Administered 2022-01-04 – 2022-01-13 (×10): 325 mg via ORAL
  Filled 2022-01-04 (×10): qty 1

## 2022-01-04 NOTE — Consult Note (Addendum)
Cardiology Consultation:   Patient ID: [name] MRN: [id number]; DOB: 08/15/1941  Admit date: 01/04/2022 Date of Consult: 01/04/2022  PCP:  Lauren Borg, MD   [place] Prov[facility] Cardiologist:  Lauren Meredith Leeds, MD  Electrophysiologist:  Lauren Haw, MD       Patient Profile:   [name] is a 81 y.o. female with a hx of HFrEF (EF = 25-30%), PAF (on apixaban), HTN, HLD, nonobstructive CAD, severe MR, mild-moderate AAS, COPD w/ chronic hypoxic respiratory failure (on 3L O2 at bl), prior CVA, CKD 3 (bl sCr ~ 1.2), hypothyroidism and GERD who is being seen 01/04/2022 for the evaluation of volume overload at the request of [name].  History of Present Illness:   [name] reports that she has had declining respiratory status for some time now that has gotten worse over the past 3 to 4 days.  She states that over the past 3 to 4 days she has had increasing DOE/SOB, fatigue, PND, and orthopnea.  She states that when she stands up to ambulate her legs "give out" due to the fatigue.  She recently saw her pulmonologist on 1/27 regarding her respiratory status.  He felt based on his assessment that her respiratory symptoms were likely secondary to a cardiac etiology as opposed to worsening of her underlying severe COPD.  Her diuretic dose was increased to 40 mg daily, but despite this she does not feel like she has been urinating very much.  She called her cardiologist yesterday and noted that she has been having worsening respiratory symptoms along with hypotension and bradycardia so she was instructed to come to the ED for further evaluation.  The patient is a former heavy smoker but quit >20 yrs ago.  She denies EtOH and illicit drug use.  She reports that she is getting over a recent bout of bronchitis, but denies fevers, chills, nausea, vomiting, diarrhea, abdominal pain, palpitations, or urinary symptoms.  She denies dietary changes/indiscretion and has been adherent  to her medications.  Of note, the patient also recently underwent a TEE on 1/24 to evaluate her mitral regurgitation.  She was found to have severe mitral regurgitation thought to be secondary MR.  In the ED her VS were afebrile, HR 106, BP 106/82, RR 24, and satting 99% RA?Marland Kitchen  Labs were notable for hemoglobin 10.8, creatinine 1.7, BNP 2812, and troponins 13.  EKG showed atrial fibrillation with RVR and a known LBBB.  CXR showed bibasilar edema and enlargement of bilateral pleural effusions.  Cardiology is consulted for evaluation.   Past Medical History:  Diagnosis Date   Allergic rhinitis    Anxiety    Atrial septal aneurysm    Basal cell carcinoma    Bradycardia    Cataract    bil cataracts removed   Chronic combined systolic and diastolic CHF (congestive heart failure) (HCC)    CKD (chronic kidney disease), stage III (HCC)    Clostridium difficile infection    COPD (chronic obstructive pulmonary disease) ([place])    Depression 11/29/2013   DI (detrusor instability)    Esophageal stricture    Hemorrhoids    Hiatal hernia    History of kidney stones    HLD (hyperlipidemia)    HTN (hypertension)    Ischemic colon ([place])    a. 08/2016 - adm to  [facility] with a ruptured colon s/p right hemicolectomy with ileostomy formation for ischemic and necrotic colon.    Lung nodule    a.  followed by pulmonology   Mild CAD    a. minor nonobstructive CAD 08/22/16.   Nephrolithiasis    hx   NICM (nonischemic cardiomyopathy) (HCC)    Orthostatic hypotension    Osteoarthritis    PAF (paroxysmal atrial fibrillation) ([place])    a. diagnosed 07/2016 s/p DCCV 08/01/16 with recurrence, managed with rate control for now.    Pneumonia 2013   PVC's (premature ventricular contractions)    Shingles since Nov 18, 2013   on right arm from shoulder to wrist   Status post dilation of esophageal narrowing    Stroke [facility]) 2001   stroke affected left leg   Syncope    a. 08/2016 felt due to orthostatic  hypotension.   TIA (transient ischemic attack) last 2001   anticoagulation therapy on coumadin    Past Surgical History:  Procedure Laterality Date   ANTERIOR LATERAL LUMBAR FUSION 4 LEVELS Right 07/03/2015   Procedure: Right Lumbar one-two, Lumbar two-three, Lumbar three-four, Lumbar four-five Anterior lateral lumbar interbody fusion;  Surgeon: Erline Levine, MD;  Location: [place] NEURO ORS;  Service: Neurosurgery;  Laterality: Right;  Right L1-2 L2-3 L3-4 L4-5 Anterior lateral lumbar interbody fusion   BALLOON DILATION N/A 03/03/2014   Procedure: BALLOON DILATION;  Surgeon: Inda Castle, MD;  Location: [facility];  Service: Endoscopy;  Laterality: N/A;   BLADDER SUSPENSION     BREAST BIOPSY Right 07/03/2006   BUBBLE STUDY  12/28/2021   Procedure: BUBBLE STUDY;  Surgeon: Berniece Salines, DO;  Location: [place];  Service: Cardiovascular;;   CARDIAC CATHETERIZATION N/A 08/22/2016   Procedure: Left Heart Cath and Coronary Angiography;  Surgeon: Peter M Martinique, MD;  Location: [place] CV LAB;  Service: Cardiovascular;  Laterality: N/A;   CARDIOVERSION N/A 08/01/2016   Procedure: CARDIOVERSION;  Surgeon: Jerline Pain, MD;  Location: [place];  Service: Cardiovascular;  Laterality: N/A;   CARDIOVERSION N/A 10/11/2016   Procedure: CARDIOVERSION;  Surgeon: Sueanne Margarita, MD;  Location: [place] ENDOSCOPY;  Service: Cardiovascular;  Laterality: N/A;   COLONOSCOPY     CYSTOSCOPY WITH RETROGRADE PYELOGRAM, URETEROSCOPY AND STENT PLACEMENT Left 11/04/2021   Procedure: CYSTOSCOPY WITH RETROGRADE PYELOGRAM, URETEROSCOPY  HOLMIUM LASER AND STENT PLACEMENT;  Surgeon: Ardis Hughs, MD;  [facility];  Service: Urology;  Laterality: Left;   ESOPHAGOGASTRODUODENOSCOPY N/A 03/03/2014   Procedure: ESOPHAGOGASTRODUODENOSCOPY (EGD);  Surgeon: Inda Castle, MD;  Location: [facility];  Service: Endoscopy;  Laterality: N/A;   EXCISION OF BASAL CELL CA  2011   FINGER SURGERY     GLUTEUS MINIMUS  REPAIR Right 03/15/2016   Procedure: RIGHT HIP GLUTEUS MEDIUS TENDON REPAIR;  Surgeon: Paralee Cancel, MD;  [facility];  Service: Orthopedics;  Laterality: Right;   ileostomy reversal     LUMBAR PERCUTANEOUS PEDICLE SCREW 4 LEVEL Right 07/03/2015   Procedure: Lumbar one-Sacral one Bilateral percutaneous pedicle screws;  Surgeon: Erline Levine, MD;  Location: [place] NEURO ORS;  Service: Neurosurgery;  Laterality: Right;  L1-S1 Bilateral percutaneous pedicle screws   RECTOCELE REPAIR  2008   A&P REPAIR with vaginal repair-DR. MCDIARMID   TEE WITHOUT CARDIOVERSION N/A 12/28/2021   Procedure: TRANSESOPHAGEAL ECHOCARDIOGRAM (TEE);  Surgeon: Berniece Salines, DO;  Location: [place];  Service: Cardiovascular;  Laterality: N/A;   THUMB SURGERY Right 10-15 yrs ago   [place]   NO BSO-DR. MABRY   VARICOSE VEIN SURGERY       Home Medications:  Prior to Admission  medications   Medication Sig Start Date End Date Taking? Authorizing Provider  albuterol (VENTOLIN HFA) 108 (90 Base) MCG/ACT inhaler Inhale 1-2 puffs into the lungs every 6 (six) hours as needed for wheezing or shortness of breath. 12/08/20  Yes Parrett, Tammy S, NP  buPROPion (WELLBUTRIN XL) 300 MG 24 hr tablet Take 1 tablet (300 mg total) by mouth daily. 10/26/21  Yes Lauren Borg, MD  Calcium Carbonate-Vitamin D (CALCIUM + D PO) Take 1 tablet by mouth 2 (two) times daily.   Yes [provider]  colestipol (COLESTID) 1 g tablet Take 1 g by mouth 2 (two) times daily.   Yes [provider]  ezetimibe (ZETIA) 10 MG tablet Take 1 tablet (10 mg total) by mouth daily. 10/26/21 01/24/22 Yes Lauren Borg, MD  ferrous sulfate 325 (65 FE) MG tablet Take 325 mg by mouth daily with breakfast.   Yes [provider]  fluticasone (FLONASE) 50 MCG/ACT nasal spray Place 1 spray into both nostrils 2 (two) times daily. 06/10/21  Yes Parrett, Tammy S, NP  Fluticasone-Umeclidin-Vilant  (TRELEGY ELLIPTA) 100-62.5-25 MCG/INH AEPB Inhale 1 puff into the lungs daily. I inhalation once daily 12/08/20  Yes Parrett, Tammy S, NP  furosemide (LASIX) 20 MG tablet Take 2 tablets (40 mg total) by mouth daily. 12/17/21  Yes Cobb, Karie Schwalbe, NP  gabapentin (NEURONTIN) 300 MG capsule 1 tab by mouth in AM, 1 po at midday, then 2 by mouth at bedtime for sleep and pain Patient taking differently: Take 300-600 mg by mouth See admin instructions. Take 300 mg by mouth in the morning and 600 mg at night 06/17/21  Yes Lauren Borg, MD  guaiFENesin (MUCINEX) 600 MG 12 hr tablet Take 1,200 mg by mouth daily.   Yes [provider]  hydroxychloroquine (PLAQUENIL) 200 MG tablet Take 400 mg by mouth at bedtime.   Yes [provider]  ipratropium (ATROVENT) 0.03 % nasal spray Place 2 sprays into both nostrils 3 (three) times daily as needed for rhinitis. 06/10/21  Yes Parrett, Tammy S, NP  ipratropium-albuterol (DUONEB) 0.5-2.5 (3) MG/3ML SOLN Take 3 mLs by nebulization every 6 (six) hours as needed (shortness of breath or wheezing). 12/09/21  Yes Cobb, Karie Schwalbe, NP  levothyroxine (SYNTHROID) 25 MCG tablet Take 1 tablet (25 mcg total) by mouth daily before breakfast. Annual appt due in Nov must see provider for future refills 12/24/20  Yes Lauren Borg, MD  Lidocaine HCl 2 % CREA Apply 1 application topically daily as needed (for shingles flares).   Yes [provider]  loratadine (CLARITIN) 10 MG tablet Take 10 mg by mouth daily.   Yes [provider]  Melatonin 10 MG TABS Take 10 mg by mouth at bedtime.   Yes [provider]  metoprolol succinate (TOPROL-XL) 100 MG 24 hr tablet Take 1 tablet (100 mg total) by mouth daily. Take with or immediately following a meal. Patient taking differently: Take 100 mg by mouth every evening. Take with or immediately following a meal. 09/24/21  Yes Camnitz, Lauren Hassell Done, MD  mirabegron ER (MYRBETRIQ) 50 MG TB24 tablet Take 50 mg by  mouth at bedtime.   Yes [provider]  montelukast (SINGULAIR) 10 MG tablet Take 1 tablet (10 mg total) by mouth daily. Annual appt due in Nov must see provider for future refills 11/05/21  Yes Lauren Borg, MD  Multiple Vitamin (MULTIVITAMIN) tablet Take 2 tablets by mouth daily.   Yes [provider]  omeprazole (PRILOSEC) 40 MG capsule Take 1 capsule (40 mg total) by mouth daily. Patient taking differently: Take 40 mg by mouth every evening. 05/28/21  Yes Lauren Borg, MD  Probiotic Product (PROBIOTIC DAILY PO) Take 1 capsule by mouth in the morning and at bedtime.   Yes [provider]  traZODone (DESYREL) 50 MG tablet Take 1 tablet (50 mg total) by mouth at bedtime and may repeat dose one time if needed. Patient taking differently: Take 100 mg by mouth at bedtime. 09/24/21  Yes Lauren Borg, MD  benzonatate (TESSALON) 200 MG capsule Take 1 capsule (200 mg total) by mouth 3 (three) times daily as needed for cough. Patient not taking: Reported on 01/04/2022 08/10/21 08/10/22  Parrett, Fonnie Mu, NP  Biotin 5 MG CAPS Take 5 mg by mouth daily.    [provider]  cyclobenzaprine (FLEXERIL) 5 MG tablet Take 1 tablet (5 mg total) by mouth 3 (three) times daily as needed for muscle spasms. Patient not taking: Reported on 01/04/2022 10/17/18   Lauren Borg, MD  furosemide (LASIX) 40 MG tablet Take 1 tablet (40 mg total) by mouth daily. Patient not taking: Reported on 12/23/2021 12/13/21   Clayton Bibles, NP  promethazine-dextromethorphan (PROMETHAZINE-DM) 6.25-15 MG/5ML syrup Take 2.5 mLs by mouth 4 (four) times daily as needed for cough. Patient not taking: Reported on 01/04/2022 12/09/21   Clayton Bibles, NP    Inpatient Medications: Scheduled Meds:  ipratropium-albuterol  3 mL Nebulization Once   methylPREDNISolone acetate  80 mg Intramuscular Once   Continuous Infusions:  PRN Meds: acetaminophen **OR** acetaminophen  Allergies:    Allergies  Allergen  Reactions   Clarithromycin Other (See Comments)    REACTION: possible rash but could have been legionarres dz with rash   Lipitor [Atorvastatin] Other (See Comments)    MUSCLE SPASMS   Simvastatin Other (See Comments)    Muscle and joint pain   Cardizem [Diltiazem Hcl] Hives   Contrast Media [Iodinated Contrast Media]     FYI - during [facility] at [facility] 08/[facility] patient developed hives, question related to cardizem or contrast dye - not clear   Diltiazem     Other reaction(s): rash   Statins     Other reaction(s): leg cramps   Rosuvastatin Calcium Other (See Comments)    fagitue and joint pain, "NO STATINS"    Social History:   Social History   Socioeconomic History   Marital status: Married    Spouse name: Shanon Brow   Number of children: 2   Years of education: Not on file   Highest education level: Not on file  Occupational History   Occupation: retired    Fish farm manager: RETIRED  Tobacco Use   Smoking status: Former    Packs/day: 3.00    Years: 42.00    Pack years: 126.00    Types: Cigarettes    Start date: 08/04/1954    Quit date: 12/06/1995    Years since quitting: 26.0   Smokeless tobacco: Never  Vaping Use   Vaping Use: Never used  Substance and Sexual Activity   Alcohol use: Never   Drug use: Never   Sexual activity: Not Currently    Birth control/protection: Surgical    Comment: 1st intercourse 81 yo-Fewer than 5 partners  Other Topics Concern   Not on file  Social History Narrative   Retired- Investment banker, operational.       Financial controller Pulmonary:   Originally from [place]. Has always lived in  [place]. She has traveled to [place], [place], [place], [place], [place], [place]. Has been on multiple cruises. 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 She did use a face mask consistently to avoid chemical fume exposure. She may have only had an inhaled exposure a couple of times. She currently has a  dog. Remote exposure to a parakeet. No mold or hot tub exposure. No asbestos exposure.    Social Determinants of Health   Financial Resource Strain: Low Risk    Difficulty of Paying Living Expenses: Not hard at all  Food Insecurity: No Food Insecurity   Worried About Charity fundraiser in the Last Year: Never true   [place] in the Last Year: Never true  Transportation Needs: No Transportation Needs   Lack of Transportation (Medical): No   Lack of Transportation (Non-Medical): No  Physical Activity: Sufficiently Active   Days of Exercise per Week: 5 days   Minutes of Exercise per Session: 30 min  Stress: No Stress Concern Present   Feeling of Stress : Not at all  Social Connections: Socially Integrated   Frequency of Communication with Friends and Family: More than three times a week   Frequency of Social Gatherings with Friends and Family: More than three times a week   Attends Religious Services: More than 4 times per year   Active Member of [employer] or Organizations: Yes   Attends Music therapist: More than 4 times per year   Marital Status: Married  Human resources officer Violence: Not At Risk   Fear of Current or Ex-Partner: No   Emotionally Abused: No   Physically Abused: No   Sexually Abused: No    Family History:    Family History  Problem Relation Age of Onset   COPD Mother    Breast cancer Mother        mid 26's   Uterine cancer Mother    Emphysema Mother    Breast cancer Maternal Aunt        x 3 aunts, post menopausal   Breast cancer Maternal Aunt    Breast cancer Maternal Aunt    Colon cancer Neg Hx    Esophageal cancer Neg Hx    Rectal cancer Neg Hx    Stomach cancer Neg Hx    Pancreatic cancer Neg Hx      ROS:  Please see the history of present illness.  All other ROS reviewed and negative.     Physical Exam/Data:   Vitals:   12/12/2021 2115 12/27/2021 2145 12/29/2021 2330 01/04/22 0115  BP: 90/68 92/65 93/73  96/78  Pulse: 87 80 (!) 59  (!) 57  Resp: 13 16 13 19   Temp:      TempSrc:      SpO2: 99% 99% 97% 96%   No intake or output data in the 24 hours ending 01/04/22 0133 Last 3 Weights 12/31/2021 12/28/2021 12/17/2021  Weight (lbs) 123 lb 3.2 oz 126 lb 126 lb  Weight (kg) 55.883 kg 57.153 kg 57.153 kg     There is no height or weight on file to calculate BMI.  General: Thin elderly female in NAD laying in bed wearing oxygen HEENT: Atraumatic, normocephalic Neck: JVD of external jugular to mid neck at 45 degrees, + HJR Vascular: Radial pulses 2+ bilaterally Cardiac: Tachycardic with irregularly irregular rhythm, normal O9/G2, II/VI systolic murmur at apex radiating to axilla Lungs: Reduced breath sounds in the right  lung base, faint crackles in left lung base, poor air exchange throughout, no wheezes or rhonchi Abd: soft, nontender, no hepatomegaly  Ext: no edema, proBNP Musculoskeletal:  No deformities, grossly normal Skin: warm and dry  Neuro: Moves all extremities, grossly normal Psych:  Normal affect   EKG:  The EKG was personally reviewed and demonstrates:        Telemetry:  Telemetry was personally reviewed and demonstrates: Atrial fibrillation with RVR  Relevant CV Studies:   TEE 12/28/21:  IMPRESSIONS     1. Left ventricular ejection fraction, by estimation, is 25 to 30%. The  left ventricle has severely decreased function. The left ventricle  demonstrates global hypokinesis.   2. Right ventricular systolic function is normal. The right ventricular  size is normal.   3. No left atrial/left atrial appendage thrombus was detected.   4. The mitral valve is grossly normal but does not coapt completely.  Severe mitral valve regurgitation. 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 Systolic flow reversal appreciated in  left upper pulmonary vein. Mild mitral  stenosis. The mean mitral valve gradient is 5.0 mmHg.   5. Tricuspid valve regurgitation is mild to moderate.    6. The aortic valve is tricuspid. Aortic valve regurgitation is not  visualized. Mild to moderate aortic valve stenosis. Aortic valve area, by  VTI measures 1.28 cm. Aortic valve mean gradient measures 7.5 mmHg.  Aortic valve Vmax measures 2.05 m/s.   7. Shunting seen with color flow left to right. Agitated saline contrast  bubble study was negative, with no evidence of any interatrial shunt.  There is a patent foramen ovale with predominantly left to right shunting  across the atrial septum.   TTE 11/26/21:  IMPRESSIONS     1. Left ventricular ejection fraction, by estimation, is 25 to 30%. The  left ventricle has severely decreased function. The left ventricle  demonstrates global hypokinesis. Left ventricular diastolic parameters are  consistent with Grade II diastolic  dysfunction (pseudonormalization). Elevated left ventricular end-diastolic  pressure.   2. Right ventricular systolic function is normal. The right ventricular  size is normal. There is normal pulmonary artery systolic pressure.   3. Left atrial size was severely dilated.   4. The mitral valve is abnormal. Moderate to severe mitral valve  regurgitation. Mild mitral stenosis. The mean mitral valve gradient is 5.0  mmHg with average heart rate of 70 bpm.   5. The aortic valve is tricuspid. There is mild calcification of the  aortic valve. There is mild thickening of the aortic valve. Aortic valve  regurgitation is not visualized. Moderate aortic valve stenosis. Aortic  valve area, by VTI measures 1.03 cm.  Aortic valve mean gradient measures 10.5 mmHg.   6. The inferior vena cava is normal in size with greater than 50%  respiratory variability, suggesting right atrial pressure of 3 mmHg.   LHC 08/22/16:  Prox LAD to Mid LAD lesion, 15 %stenosed. Ost Cx to Prox Cx lesion, 15 %stenosed. Prox RCA to Mid RCA lesion, 20 %stenosed. There is mild left ventricular systolic dysfunction. LV end diastolic pressure is  normal. The left ventricular ejection fraction is 45-50% by visual estimate.   1. Minor nonobstructive CAD 2. Mild LV dysfunction with distal inferior HK 3. Measurement of LV pressures limited by frequent PVCs but EDP appears normal.  Laboratory Data:  High Sensitivity Troponin:   Recent Labs  Lab 12/22/2021 1913 12/11/2021 2113  TROPONINIHS 13 13     Chemistry  Recent Labs  Lab 12/19/2021 1913  NA 135  K 4.1  CL 98  CO2 27  GLUCOSE 94  BUN 30*  CREATININE 1.73*  CALCIUM 8.8*  GFRNONAA 29*  ANIONGAP 10    Recent Labs  Lab 12/05/2021 1913  PROT 5.4*  ALBUMIN 3.2*  AST 19  ALT 18  ALKPHOS 88  BILITOT 0.4   Lipids No results for input(s): CHOL, TRIG, HDL, LABVLDL, LDLCALC, CHOLHDL in the last 168 hours.  Hematology Recent Labs  Lab 12/13/2021 1913  WBC 5.2  RBC 4.09  HGB 10.8*  HCT 34.8*  MCV 85.1  MCH 26.4  MCHC 31.0  RDW 16.2*  PLT 154   Thyroid  Recent Labs  Lab 12/10/2021 1817  TSH 2.271    BNP Recent Labs  Lab 12/23/2021 1737  BNP 2,812.3*    DDimer No results for input(s): DDIMER in the last 168 hours.   Radiology/Studies:  DG Chest Portable 1 View  Result Date: 12/09/2021 CLINICAL DATA:  Short of breath, not feeling well for 2 weeks EXAM: PORTABLE CHEST 1 VIEW COMPARISON:  12/09/2021 FINDINGS: Single frontal view of the chest demonstrates a stable cardiac silhouette. Persistent bilateral pleural effusions, left greater than right, minimally increased since prior study. Increased consolidation at the left lung base may reflect airspace disease or atelectasis. Diffuse interstitial prominence is again noted, with slight increase in interstitial and ground-glass opacities at the lung bases consistent with worsening of volume status. No pneumothorax. No acute bony abnormality. IMPRESSION: 1. Worsening volume status, with developing bibasilar edema and slight enlargement of bilateral pleural effusions. Electronically Signed   By: Randa Ngo M.D.   On:  12/16/2021 17:59     Assessment and Plan:   QIANA LANDGREBE is a 81 y.o. female with a hx of HFrEF (EF = 25-30%), PAF (on apixaban), HTN, HLD, nonobstructive CAD, severe MR, mild-moderate AS, COPD w/ chronic hypoxic respiratory failure (on 3L O2 at bl), prior CVA, CKD 3 (bl sCr ~ 1.2), hypothyroidism and GERD who is being seen 01/04/2022 for the evaluation of volume overload at the request of [name].  #Acute on Chronic HFrEF Exacerbation :: Patient presenting with worsening DOE/SOB, PND, and orthopnea with an elevated BNP and pleural effusions on CXR.  Findings are consistent with volume overload secondary to decompensated heart failure.  No evidence of cardiogenic shock or low output state.  No clear exacerbating factor apart from her underlying valvular disease which is discussed below.  I recommend diuresis and strict monitoring of ins and outs and daily weights. -Hold home p.o. 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  The MR was felt to be secondary to poor coaptation of the leaflets as result of annular dilation.  Her MR is certainly contributing to her shortness of breath and volume overload.  Diuresis Lauren hopefully help the symptoms.  Once the patient is better diuresed, I think would be reasonable to consider an evaluation for MitraClip to reduce her burden of MR. -Diuresis as above -Consider MitraClip evaluation  #PAF w/ RVR :: Currently in A. fib with mild tachycardia.  I think her A. fib with RVR is a consequence of her decompensated heart  failure and severe MR.  We Lauren continue low-dose beta-blocker to promote rate control.  She does not appear to be in a low output state from a  heart failure setting of the understanding.  As her blood pressure does not tolerate it however, we Lauren have to discontinue. -Fractionating and dose reduce metoprolol as above -Continue home Eliquis  #HTN #HLD -Changed to metoprolol and Lasix as above -Continue Zetia 10 mg daily   #AKI on CKD :: I suspect that this is cardiorenal and Lauren get better with diuresis. -Diuresis as above  #Anemia -check iron studies given benefit of iron in HFrEF   Risk Assessment/Risk Scores:        New York Heart Association (NYHA) Functional Class NYHA Class III  CHA2DS2-VASc Score = 8   This indicates a 10.8% annual risk of stroke. The patient's score is based upon: CHF History: 1 HTN History: 1 Diabetes History: 0 Stroke History: 2 Vascular Disease History: 1 Age Score: 2 Gender Score: 1         For questions or updates, please contact [place] Please consult [email or web address] for contact info under    Signed, Hershal Coria, MD  01/04/2022 1:33 AM

## 2022-01-04 NOTE — ED Notes (Signed)
Patient assisted off BSC without difficulty.

## 2022-01-04 NOTE — H&P (Signed)
History and Physical    PLEASE NOTE THAT DRAGON DICTATION SOFTWARE WAS USED IN THE CONSTRUCTION OF THIS NOTE.   [name] PZW:[id number] DOB: 27-Apr-1941 DOA: 12/16/2021  PCP: Biagio Borg, MD  Patient coming from: home   I have personally briefly reviewed patient's old medical records in [place]  Chief Complaint: Shortness of breath  HPI: [name] is a 81 y.o. female with medical history significant for chronic systolic/diastolic heart failure, with most recent LVEF 25 to 30%, paroxysmal atrial fibrillation complicated by sick sinus syndrome, COPD, hyperlipidemia, anemia of chronic disease, who is admitted to [facility] on 12/19/2021 with acute on chronic systolic/diastolic heart failure after presenting from home to [facility] ED complaining of shortness of breath.   The patient reports 4 to 5 days of progressive shortness of breath associated orthopnea and worsening of edema in the bilateral lower extremities.  She reports associated nonproductive cough in the absence of any associated subjective fever, chills, rigors, or generalized myalgias.  Denies any associated chest pain, palpitations, diaphoresis, nausea, vomiting, dizziness, presyncope, or syncope.  Denies any new onset calf tenderness or new lower extremity erythema.  No hemoptysis.  Also denies any recent melena or hematochezia.  No recent rhinitis, rhinorrhea, sore throat, wheezing, rash.  She reports good compliance with her outpatient diuretic regimen which consists of Lasix 40 mg p.o. daily.  She confirms a history of chronic systolic/diastolic heart failure, with most recent echocardiogram from 11/26/2021 notable for LVEF 25 to 30% with global left ventricular hypokinesis, grade 2 diastolic dysfunction.  Per chart review, it appears that the nature of her chronic heart failure is felt to be on the basis of nonischemic cardiomyopathy.  She also has a history of COPD acknowledging that she is a former smoker,  and completely quit smoking approximately 25 years ago.  She follows with pulmonology as an outpatient, with recent associated appointment revealing pulmonology's opinion that her progressive shortness of breath is more cardiac than pulmonary in primary origin at this point.   Her diuresis as an outpatient has been complicated by intermittent hypotension.  Additionally, per chart review, her baseline serum creatinine appears to be 1.2-1.3, with most recent prior value noted to be 1.17 on 12/09/2021.     ED Course:  Vital signs in the ED were notable for the following: Afebrile; heart rate 93-1 05; blood pressure 87/62 - 106/82, respiratory rate 14-24, oxygen saturation 97 to 100% on room air.  Labs were notable for the following: CMP was notable for the following: Sodium 135, potassium 4.1, creatinine 1.73.  High-sensitivity troponin I x2 values were both found to be 13.  BNP 2800 compared to most recent prior value of 340 in 2017.  CBC notable for episode count 5200, hemoglobin 10.8 relative most recent prior value of 12 in April 2022.  Urinalysis showed no white blood cells, no red blood cells, and was negative for protein.  COVID-19/influenza PCR found to be negative.  Imaging and additional notable ED work-up: EKG shows atrial fibrillation with interventricular conduction delay, heart rate 108, no evidence of T wave or ST changes, including no evidence of ST elevation.  Chest x-ray, in comparison to most recent prior chest x-ray from 12/09/2021 showed interval worsening of bilateral pulmonary edema as well as interval increase in size of the bilateral pleural effusions, while demonstrating no evidence of infiltrate or pneumothorax.  EDP discussed the patient's case with on-call cardiology, who will formally consult, with additional recommendations pending at  this time.  While in the ED, the following were administered: Lasix 80 mg IV x1.  Subsequently, the patient was admitted to the PCU for  further evaluation management of presenting acute on chronic systolic/diastolic heart failure complicated by borderline hypotension as well as acute kidney injury.     Review of Systems: As per HPI otherwise 10 point review of systems negative.   Past Medical History:  Diagnosis Date   Allergic rhinitis    Anxiety    Atrial septal aneurysm    Basal cell carcinoma    Bradycardia    Cataract    bil cataracts removed   Chronic combined systolic and diastolic CHF (congestive heart failure) (HCC)    CKD (chronic kidney disease), stage III (HCC)    Clostridium difficile infection    COPD (chronic obstructive pulmonary disease) ([place])    Depression 11/29/2013   DI (detrusor instability)    Esophageal stricture    Hemorrhoids    Hiatal hernia    History of kidney stones    HLD (hyperlipidemia)    HTN (hypertension)    Ischemic colon ([place])    a. 08/2016 - adm to  [facility] with a ruptured colon s/p right hemicolectomy with ileostomy formation for ischemic and necrotic colon.    Lung nodule    a. followed by pulmonology   Mild CAD    a. minor nonobstructive CAD 08/22/16.   Nephrolithiasis    hx   NICM (nonischemic cardiomyopathy) (HCC)    Orthostatic hypotension    Osteoarthritis    PAF (paroxysmal atrial fibrillation) ([place])    a. diagnosed 07/2016 s/p DCCV 08/01/16 with recurrence, managed with rate control for now.    Pneumonia 2013   PVC's (premature ventricular contractions)    Shingles since Nov 18, 2013   on right arm from shoulder to wrist   Status post dilation of esophageal narrowing    Stroke [facility]) 2001   stroke affected left leg   Syncope    a. 08/2016 felt due to orthostatic hypotension.   TIA (transient ischemic attack) last 2001   anticoagulation therapy on coumadin    Past Surgical History:  Procedure Laterality Date   ANTERIOR LATERAL LUMBAR FUSION 4 LEVELS Right 07/03/2015   Procedure: Right Lumbar one-two, Lumbar two-three, Lumbar three-four, Lumbar  four-five Anterior lateral lumbar interbody fusion;  Surgeon: Erline Levine, MD;  Location: [place] NEURO ORS;  Service: Neurosurgery;  Laterality: Right;  Right L1-2 L2-3 L3-4 L4-5 Anterior lateral lumbar interbody fusion   BALLOON DILATION N/A 03/03/2014   Procedure: BALLOON DILATION;  Surgeon: Inda Castle, MD;  Location: [facility] ENDOSCOPY;  Service: Endoscopy;  Laterality: N/A;   BLADDER SUSPENSION     BREAST BIOPSY Right 07/03/2006   BUBBLE STUDY  12/28/2021   Procedure: BUBBLE STUDY;  Surgeon: Berniece Salines, DO;  Location: [place];  Service: Cardiovascular;;   CARDIAC CATHETERIZATION N/A 08/22/2016   Procedure: Left Heart Cath and Coronary Angiography;  Surgeon: Peter M Martinique, MD;  Location: [place] CV LAB;  Service: Cardiovascular;  Laterality: N/A;   CARDIOVERSION N/A 08/01/2016   Procedure: CARDIOVERSION;  Surgeon: Jerline Pain, MD;  Location: [place];  Service: Cardiovascular;  Laterality: N/A;   CARDIOVERSION N/A 10/11/2016   Procedure: CARDIOVERSION;  Surgeon: Sueanne Margarita, MD;  Location: [place] ENDOSCOPY;  Service: Cardiovascular;  Laterality: N/A;   COLONOSCOPY     CYSTOSCOPY WITH RETROGRADE PYELOGRAM, URETEROSCOPY AND STENT PLACEMENT Left 11/04/2021   Procedure: CYSTOSCOPY WITH RETROGRADE PYELOGRAM, URETEROSCOPY  HOLMIUM  LASER AND STENT PLACEMENT;  Surgeon: Ardis Hughs, MD;  Location: [facility];  Service: Urology;  Laterality: Left;   ESOPHAGOGASTRODUODENOSCOPY N/A 03/03/2014   Procedure: ESOPHAGOGASTRODUODENOSCOPY (EGD);  Surgeon: Inda Castle, MD;  Location: [facility];  Service: Endoscopy;  Laterality: N/A;   EXCISION OF BASAL CELL CA  2011   FINGER SURGERY     GLUTEUS MINIMUS REPAIR Right 03/15/2016   Procedure: RIGHT HIP GLUTEUS MEDIUS TENDON REPAIR;  Surgeon: Paralee Cancel, MD;  Location: [facility];  Service: Orthopedics;  Laterality: Right;   ileostomy reversal     LUMBAR PERCUTANEOUS PEDICLE SCREW 4 LEVEL Right 07/03/2015   Procedure: Lumbar one-Sacral one  Bilateral percutaneous pedicle screws;  Surgeon: Erline Levine, MD;  Location: [place] NEURO ORS;  Service: Neurosurgery;  Laterality: Right;  L1-S1 Bilateral percutaneous pedicle screws   RECTOCELE REPAIR  2008   A&P REPAIR with vaginal repair-DR. MCDIARMID   TEE WITHOUT CARDIOVERSION N/A 12/28/2021   Procedure: TRANSESOPHAGEAL ECHOCARDIOGRAM (TEE);  Surgeon: Berniece Salines, DO;  Location: [place];  Service: Cardiovascular;  Laterality: N/A;   THUMB SURGERY Right 10-15 yrs ago   [place]   NO BSO-DR. MABRY   VARICOSE VEIN SURGERY      Social History:  reports that she quit smoking about 26 years ago. Her smoking use included cigarettes. She started smoking about 67 years ago. She has a 126.00 pack-year smoking history. She has never used smokeless tobacco. She reports that she does not drink alcohol and does not use drugs.   Allergies  Allergen Reactions   Clarithromycin Other (See Comments)    REACTION: possible rash but could have been legionarres dz with rash   Lipitor [Atorvastatin] Other (See Comments)    MUSCLE SPASMS   Simvastatin Other (See Comments)    Muscle and joint pain   Cardizem [Diltiazem Hcl] Hives   Contrast Media [Iodinated Contrast Media]     FYI - during admission at [facility] 08/2016 patient developed hives, question related to cardizem or contrast dye - not clear   Diltiazem     Other reaction(s): rash   Statins     Other reaction(s): leg cramps   Rosuvastatin Calcium Other (See Comments)    fagitue and joint pain, "NO STATINS"    Family History  Problem Relation Age of Onset   COPD Mother    Breast cancer Mother        mid 67's   Uterine cancer Mother    Emphysema Mother    Breast cancer Maternal Aunt        x 3 aunts, post menopausal   Breast cancer Maternal Aunt    Breast cancer Maternal Aunt    Colon cancer Neg Hx    Esophageal cancer Neg Hx    Rectal cancer Neg Hx    Stomach cancer  Neg Hx    Pancreatic cancer Neg Hx     Family history reviewed and not pertinent    Prior to Admission medications   Medication Sig Start Date End Date Taking? Authorizing Provider  albuterol (VENTOLIN HFA) 108 (90 Base) MCG/ACT inhaler Inhale 1-2 puffs into the lungs every 6 (six) hours as needed for wheezing or shortness of breath. 12/08/20  Yes Parrett, Tammy S, NP  buPROPion (WELLBUTRIN XL) 300 MG 24 hr tablet Take 1 tablet (300 mg total) by mouth daily. 10/26/21  Yes Biagio Borg, MD  Calcium Carbonate-Vitamin D (CALCIUM + D PO) Take 1  tablet by mouth 2 (two) times daily.   Yes [provider]  colestipol (COLESTID) 1 g tablet Take 1 g by mouth 2 (two) times daily.   Yes [provider]  ezetimibe (ZETIA) 10 MG tablet Take 1 tablet (10 mg total) by mouth daily. 10/26/21 01/24/22 Yes Biagio Borg, MD  ferrous sulfate 325 (65 FE) MG tablet Take 325 mg by mouth daily with breakfast.   Yes [provider]  fluticasone (FLONASE) 50 MCG/ACT nasal spray Place 1 spray into both nostrils 2 (two) times daily. 06/10/21  Yes Parrett, Tammy S, NP  Fluticasone-Umeclidin-Vilant (TRELEGY ELLIPTA) 100-62.5-25 MCG/INH AEPB Inhale 1 puff into the lungs daily. I inhalation once daily 12/08/20  Yes Parrett, Tammy S, NP  furosemide (LASIX) 20 MG tablet Take 2 tablets (40 mg total) by mouth daily. 12/17/21  Yes Cobb, Karie Schwalbe, NP  gabapentin (NEURONTIN) 300 MG capsule 1 tab by mouth in AM, 1 po at midday, then 2 by mouth at bedtime for sleep and pain Patient taking differently: Take 300-600 mg by mouth See admin instructions. Take 300 mg by mouth in the morning and 600 mg at night 06/17/21  Yes Biagio Borg, MD  guaiFENesin (MUCINEX) 600 MG 12 hr tablet Take 1,200 mg by mouth daily.   Yes [provider]  hydroxychloroquine (PLAQUENIL) 200 MG tablet Take 400 mg by mouth at bedtime.   Yes [provider]  ipratropium (ATROVENT) 0.03 % nasal spray Place 2 sprays into both  nostrils 3 (three) times daily as needed for rhinitis. 06/10/21  Yes Parrett, Tammy S, NP  ipratropium-albuterol (DUONEB) 0.5-2.5 (3) MG/3ML SOLN Take 3 mLs by nebulization every 6 (six) hours as needed (shortness of breath or wheezing). 12/09/21  Yes Cobb, Karie Schwalbe, NP  levothyroxine (SYNTHROID) 25 MCG tablet Take 1 tablet (25 mcg total) by mouth daily before breakfast. Annual appt due in Nov must see provider for future refills 12/24/20  Yes Biagio Borg, MD  Lidocaine HCl 2 % CREA Apply 1 application topically daily as needed (for shingles flares).   Yes [provider]  loratadine (CLARITIN) 10 MG tablet Take 10 mg by mouth daily.   Yes [provider]  Melatonin 10 MG TABS Take 10 mg by mouth at bedtime.   Yes [provider]  metoprolol succinate (TOPROL-XL) 100 MG 24 hr tablet Take 1 tablet (100 mg total) by mouth daily. Take with or immediately following a meal. Patient taking differently: Take 100 mg by mouth every evening. Take with or immediately following a meal. 09/24/21  Yes Camnitz, Will Hassell Done, MD  mirabegron ER (MYRBETRIQ) 50 MG TB24 tablet Take 50 mg by mouth at bedtime.   Yes [provider]  montelukast (SINGULAIR) 10 MG tablet Take 1 tablet (10 mg total) by mouth daily. Annual appt due in Nov must see provider for future refills 11/05/21  Yes Biagio Borg, MD  Multiple Vitamin (MULTIVITAMIN) tablet Take 2 tablets by mouth daily.   Yes [provider]  omeprazole (PRILOSEC) 40 MG capsule Take 1 capsule (40 mg total) by mouth daily. Patient taking differently: Take 40 mg by mouth every evening. 05/28/21  Yes Biagio Borg, MD  Probiotic Product (PROBIOTIC DAILY PO) Take 1 capsule by mouth in the morning and at bedtime.   Yes [provider]  traZODone (DESYREL) 50 MG tablet Take 1 tablet (50 mg total) by mouth at bedtime and may repeat dose one time if needed. Patient taking differently: Take  100 mg by mouth at bedtime. 09/24/21   Yes Biagio Borg, MD  benzonatate (TESSALON) 200 MG capsule Take 1 capsule (200 mg total) by mouth 3 (three) times daily as needed for cough. Patient not taking: Reported on 01/04/2022 08/10/21 08/10/22  Parrett, Fonnie Mu, NP  Biotin 5 MG CAPS Take 5 mg by mouth daily.    [provider]  cyclobenzaprine (FLEXERIL) 5 MG tablet Take 1 tablet (5 mg total) by mouth 3 (three) times daily as needed for muscle spasms. Patient not taking: Reported on 01/04/2022 10/17/18   Biagio Borg, MD  furosemide (LASIX) 40 MG tablet Take 1 tablet (40 mg total) by mouth daily. Patient not taking: Reported on 12/23/2021 12/13/21   Clayton Bibles, NP  promethazine-dextromethorphan (PROMETHAZINE-DM) 6.25-15 MG/5ML syrup Take 2.5 mLs by mouth 4 (four) times daily as needed for cough. Patient not taking: Reported on 01/04/2022 12/09/21   Clayton Bibles, NP     Objective    Physical Exam: Vitals:   12/17/2021 2145 12/27/2021 2330 01/04/22 0115 01/04/22 0138  BP: 92/65 93/73 96/78    Pulse: 80 (!) 59 (!) 57   Resp: 16 13 19    Temp:    97.9 F (36.6 C)  TempSrc:    Oral  SpO2: 99% 97% 96%     General: appears to be stated age; alert, oriented; mildly increased work of breathing noted Skin: warm, dry, no rash Head:  AT/[place] Mouth:  Oral mucosa membranes appear moist, normal dentition Neck: supple; trachea midline Heart:  RRR; did not appreciate any M/R/G Lungs: Bibasilar crackles noted, but otherwise CTAB, did not appreciate any wheezes,or rhonchi Abdomen: + BS; soft, ND, NT Vascular: 2+ pedal pulses b/l; 2+ radial pulses b/l Extremities: 1-2+ edema in the bilateral lower extremities, no muscle wasting Neuro: strength and sensation intact in upper and lower extremities b/l     Labs on Admission: I have personally reviewed following labs and imaging studies  CBC: Recent Labs  Lab 12/31/2021 1913  WBC 5.2  NEUTROABS 3.4  HGB 10.8*  HCT 34.8*  MCV 85.1  PLT 562   Basic Metabolic Panel: Recent  Labs  Lab 01/01/2022 1913  NA 135  K 4.1  CL 98  CO2 27  GLUCOSE 94  BUN 30*  CREATININE 1.73*  CALCIUM 8.8*   GFR: Estimated Creatinine Clearance: 20.7 mL/min (A) (by C-G formula based on SCr of 1.73 mg/dL (H)). Liver Function Tests: Recent Labs  Lab 12/28/2021 1913  AST 19  ALT 18  ALKPHOS 88  BILITOT 0.4  PROT 5.4*  ALBUMIN 3.2*   No results for input(s): LIPASE, AMYLASE in the last 168 hours. No results for input(s): AMMONIA in the last 168 hours. Coagulation Profile: No results for input(s): INR, PROTIME in the last 168 hours. Cardiac Enzymes: No results for input(s): CKTOTAL, CKMB, CKMBINDEX, TROPONINI in the last 168 hours. BNP (last 3 results) No results for input(s): PROBNP in the last 8760 hours. HbA1C: No results for input(s): HGBA1C in the last 72 hours. CBG: No results for input(s): GLUCAP in the last 168 hours. Lipid Profile: No results for input(s): CHOL, HDL, LDLCALC, TRIG, CHOLHDL, LDLDIRECT in the last 72 hours. Thyroid Function Tests: Recent Labs    12/05/2021 1817  TSH 2.271   Anemia Panel: No results for input(s): VITAMINB12, FOLATE, FERRITIN, TIBC, IRON, RETICCTPCT in the last 72 hours. Urine analysis:    Component Value Date/Time   COLORURINE YELLOW 01/02/2022 2046   APPEARANCEUR CLEAR 12/07/2021 2046  LABSPEC 1.025 12/31/2021 2046   PHURINE 5.5 12/21/2021 2046   GLUCOSEU NEGATIVE 12/23/2021 2046   GLUCOSEU NEGATIVE 10/29/2019 1135   HGBUR NEGATIVE 12/31/2021 2046   BILIRUBINUR NEGATIVE 01/02/2022 2046   KETONESUR NEGATIVE 12/29/2021 2046   PROTEINUR NEGATIVE 12/18/2021 2046   UROBILINOGEN 0.2 10/29/2019 1135   NITRITE NEGATIVE 12/23/2021 2046   LEUKOCYTESUR NEGATIVE 12/18/2021 2046    Radiological Exams on Admission: DG Chest Portable 1 View  Result Date: 12/25/2021 CLINICAL DATA:  Short of breath, not feeling well for 2 weeks EXAM: PORTABLE CHEST 1 VIEW COMPARISON:  12/09/2021 FINDINGS: Single frontal view of the chest  demonstrates a stable cardiac silhouette. Persistent bilateral pleural effusions, left greater than right, minimally increased since prior study. Increased consolidation at the left lung base may reflect airspace disease or atelectasis. Diffuse interstitial prominence is again noted, with slight increase in interstitial and ground-glass opacities at the lung bases consistent with worsening of volume status. No pneumothorax. No acute bony abnormality. IMPRESSION: 1. Worsening volume status, with developing bibasilar edema and slight enlargement of bilateral pleural effusions. Electronically Signed   By: Randa Ngo M.D.   On: 12/07/2021 17:59     EKG: Independently reviewed, with result as described above.    Assessment/Plan   Principal Problem:   Acute on chronic combined systolic and diastolic CHF (congestive heart failure) (HCC) Active Problems:   HLD (hyperlipidemia)   Atrial fibrillation (HCC)   COPD (chronic obstructive pulmonary disease) (HCC)   Allergic rhinitis   SOB (shortness of breath)   AKI (acute kidney injury) ([place])     #) Acute on chronic systolic/diastolic heart failure: dx of acute decompensation on the basis of presenting 4 to 5 days of progressive shortness of breath associate with orthopnea, worsening of peripheral edema, with chest x-ray showing interval worsening of bilateral pulmonary edema as well as interval increase in size of bilateral pleural effusions, as further described above. This is in the context of a known history of chronic systolic/diastolic heart failure, with most recent echocardiogram performed in December 2022, which was notable for LVEF 25 to 30%, global left ventricular hypokinesis, and grade 2 diastolic dysfunction. Etiology leading to presenting acutely decompensated heart failure unclear. Patient conveys good compliance with home diuretic therapy, which consists of Lasix 40 mg subcu daily, as well as good compliance with their additional home  cardiac medications, which include metoprolol succinate.  Of note, outpatient diuresis efforts have been complicated by borderline hypotension, as further detailed above . overall, ACS leading to presenting acutely decompensated heart failure appears less likely at this time in the absence of any recent CP, presenting EKG showing no evidence of acute ischemic changes, as well as non-elevated troponin and spite of aforementioned duration of the patient's symptoms.   Of note, patient received Lasix 80 mg IV x1 while in the ED today. Presentation warrants additional IV diuresis, as further detailed below, with close monitoring of ensuing renal function, electrolytes,, volume status, and blood pressure as further noted below. Will also closely monitor ensuing HR as an additional means to evaluate volume status and help guide subsequent diuresis decision-making. As patient is already on a BB at home, will plan to continue this.  Of note, cardiology has been consulted, with additional recommendations pending at this time.  Of note, the patient is not currently on an ACE inhibitor/ARB nor Isordil/hydralazine as an outpatient, likely likely because of the aforementioned ongoing borderline soft blood pressures.    Plan: monitor strict I's & O's and  daily weights. Monitor on telemetry, including trend in HR in response to diuresis, as above. Monitor continuous pulse oximetry. Repeat BMP in the morning, including for monitoring trend of potassium, bicarbonate, and renal function in response to interval diuresis efforts. Add-on serum magnesium level, and repeat this level in the AM. Close monitoring of ensuing blood pressure response to diuresis efforts.  Lasix 20 mg IV twice daily, with next dose to occur in the morning, with close monitoring of interval blood pressure. Continue outpatient BB,. cardiology consulted, as above.       #) Acute kidney injury: Presenting serum creatinine 1.73 relative to her baseline  range of 1.2-1.3, with prior value noted to be 1.17 on 12/09/2021.  Suspect that this is prerenal in nature, in the setting of diminished renal perfusion gradient as a consequence of acute on chronic systolic/diastolic heart failure, as above.  Of note, urinalysis with microscopy from this evening showed no evidence of white blood cell or red blood cell casts, nor any evidence of protein.  Plan: Further evaluation management of presenting acute on chronic systolic/diastolic heart failure, as above, including plan for IV diuresis.  Monitor strict I's and O's and daily weights.  Tempt avoid nephrotoxic agents.  Add on serum magnesium level.  Repeat CMP in the morning.         #) Paroxysmal atrial fibrillation: Documented history of such. In setting of CHA2DS2-VASc score of  8, there is an indication for chronic anticoagulation for thromboembolic prophylaxis.  It does not appear that she is chronically anticoagulated, and rationale for this is not currently clear to me at this time.  Home AV nodal blocking regimen: Outpatient beta-blocker.  Most recent echocardiogram performed in December 2022, as further detailed above. Presenting EKG suggestive of rate controlled atrial fibrillation.  There is also documentation of complicating factor of sick sinus syndrome, for which she has been following with electrophysiology cardiology as an outpatient.   Plan: monitor strict I's & O's and daily weights. Repeat BMP/CBC in AM. Check serum mag level. Continue home AV nodal blocking regimen.  Monitor on telemetry.       #) COPD: Documented history of such, with the patient acknowledging that she is a former smoker, and completely quit smoking approximately 25 years ago.  Follows close with outpatient pulmonology, who feel that patient's recent shortness of breath is more on the basis of heart failure then underlying COPD, as above.  Patient ports good compliance with outpatient respiratory regimen which includes  scheduled Trelegy as well as prn albuterol inhaler.  Overall, no clinical evidence to suggest acute COPD exacerbation at this time.  Plan: Continue outpatient Trelegy as well as as needed albuterol.  Monitor continuous pulse oximetry.  Add on serum magnesium level and check serum phosphorus level.         #) Hyperlipidemia: documented h/o such.  In the setting of a documented history of statin intolerance, she is on Zetia as outpatient.    Plan: continue home Zetia.        #) Anemia of chronic disease: Documented history of such, with baseline hemoglobin appearing to be approximately 12, with presenting hemoglobin slightly lower than his baseline, with presenting value noted to be 10.8, potentially representing a hemodelusional contribution in the setting of suspected acute volume overload as a consequence of presenting acute on chronic systolic/diastolic heart failure, as above.  No evidence of active bleed at this time.  She is on daily oral iron supplementation as outpatient.  Plan: Repeat  CBC in the morning.  Continue home daily oral iron supplementation.          #) Allergic Rhinitis: documented h/o such, on scheduled intranasal Flonase as outpatient as well as loratadine.    Plan: cont home Flonase and loratadine.         #) acquired hypothyroidism: documented h/o such, on Synthroid as outpatient.   Plan: cont home Synthroid.          #) GERD: documented h/o such; on omeprazole as outpatient.   Plan: continue home PPI.         DVT prophylaxis: SCD's   Code Status: Full code Family Communication: none Disposition Plan: Per Rounding Team Consults called: On-call cardiology consulted, as further detailed above;  Admission status: Inpatient; PCU   PLEASE NOTE THAT DRAGON DICTATION SOFTWARE WAS USED IN THE CONSTRUCTION OF THIS NOTE.   [place] DO [facility]  From [place]   01/04/2022, 2:25 AM

## 2022-01-04 NOTE — Progress Notes (Signed)
HPI: [name] is a 81 y.o. female with medical history significant for chronic systolic/diastolic heart failure, with most recent LVEF 25 to 30%, paroxysmal atrial fibrillation complicated by sick sinus syndrome, COPD, hyperlipidemia, anemia of chronic disease, who is admitted to [facility] on 12/16/2021 with acute on chronic systolic/diastolic heart failure after presenting from home to [facility] ED complaining of shortness of breath.   The patient reports 4 to 5 days of progressive shortness of breath associated orthopnea and worsening of edema in the bilateral lower extremities.  She reports associated nonproductive cough in the absence of any associated subjective fever, chills, rigors, or generalized myalgias.  Denies any associated chest pain, palpitations, diaphoresis, nausea, vomiting, dizziness, presyncope, or syncope.  Denies any new onset calf tenderness or new lower extremity erythema.  No hemoptysis.  Also denies any recent melena or hematochezia.  No recent rhinitis, rhinorrhea, sore throat, wheezing, rash.  She reports good compliance with her outpatient diuretic regimen which consists of Lasix 40 mg p.o. daily.  She confirms a history of chronic systolic/diastolic heart failure, with most recent echocardiogram from 11/26/2021 notable for LVEF 25 to 30% with global left ventricular hypokinesis, grade 2 diastolic dysfunction.  Per chart review, it appears that the nature of her chronic heart failure is felt to be on the basis of nonischemic cardiomyopathy.  She also has a history of COPD acknowledging that she is a former smoker, and completely quit smoking approximately 25 years ago.  She follows with pulmonology as an outpatient, with recent associated appointment revealing pulmonology's opinion that her progressive shortness of breath is more cardiac than pulmonary in primary origin at this point.   Her diuresis as an outpatient has been complicated by intermittent hypotension.   Additionally, per chart review, her baseline serum creatinine appears to be 1.2-1.3, with most recent prior value noted to be 1.17 on 12/09/2021.   ED Course:  Vital signs in the ED were notable for the following: Afebrile; heart rate 93-1 05; blood pressure 87/62 - 106/82, respiratory rate 14-24, oxygen saturation 97 to 100% on room air.  Labs were notable for the following: CMP was notable for the following: Sodium 135, potassium 4.1, creatinine 1.73.  High-sensitivity troponin I x2 values were both found to be 13.  BNP 2800 compared to most recent prior value of 340 in 2017.  CBC notable for episode count 5200, hemoglobin 10.8 relative most recent prior value of 12 in April 2022.  Urinalysis showed no white blood cells, no red blood cells, and was negative for protein.  COVID-19/influenza PCR found to be negative.  Imaging and additional notable ED work-up: EKG shows atrial fibrillation with interventricular conduction delay, heart rate 108, no evidence of T wave or ST changes, including no evidence of ST elevation.  Chest x-ray, in comparison to most recent prior chest x-ray from 12/09/2021 showed interval worsening of bilateral pulmonary edema as well as interval increase in size of the bilateral pleural effusions, while demonstrating no evidence of infiltrate or pneumothorax.  EDP discussed the patient's case with on-call cardiology, who will formally consult, with additional recommendations pending at this time.  While in the ED, the following were administered: Lasix 80 mg IV x1.  Subsequently, the patient was admitted to the PCU for further evaluation management of presenting acute on chronic systolic/diastolic heart failure complicated by borderline hypotension as well as acute kidney injury.  Subjective Still short of breath denies chest pain   Objective    Physical Exam:  Vitals:   01/04/22 0530 01/04/22 0600 01/04/22 0800 01/04/22 0900  BP:  108/80 (!) 119/95 96/80  Pulse: (!) 109  (!) 105 93 89  Resp: (!) 24 18 18 18   Temp:      TempSrc:      SpO2: 98% 99% 95% 99%    General: appears to be stated age; alert, oriented; mildly increased work of breathing noted Skin: warm, dry, no rash Head:  AT/[place] Mouth:  Oral mucosa membranes appear moist, normal dentition Neck: supple; trachea midline Heart:  RRR; did not appreciate any M/R/G Lungs: Bibasilar crackles noted, but otherwise CTAB, did not appreciate any wheezes,or rhonchi Abdomen: + BS; soft, ND, NT Vascular: 2+ pedal pulses b/l; 2+ radial pulses b/l Extremities: 1-2+ edema in the bilateral lower extremities, no muscle wasting Neuro: strength and sensation intact in upper and lower extremities b/l     Labs on Admission: I have personally reviewed following labs and imaging studies  CBC: Recent Labs  Lab 12/20/2021 1913 01/04/22 0316  WBC 5.2 6.3  NEUTROABS 3.4 4.1  HGB 10.8* 11.1*  HCT 34.8* 37.0  MCV 85.1 85.6  PLT 154 973   Basic Metabolic Panel: Recent Labs  Lab 12/21/2021 1913 01/04/22 0316  NA 135 138  K 4.1 4.1  CL 98 101  CO2 27 27  GLUCOSE 94 83  BUN 30* 29*  CREATININE 1.73* 1.58*  CALCIUM 8.8* 8.9  MG  --  1.7  PHOS  --  3.6   GFR: Estimated Creatinine Clearance: 22.7 mL/min (A) (by C-G formula based on SCr of 1.58 mg/dL (H)). Liver Function Tests: Recent Labs  Lab 12/10/2021 1913 01/04/22 0316  AST 19 19  ALT 18 19  ALKPHOS 88 92  BILITOT 0.4 0.6  PROT 5.4* 5.8*  ALBUMIN 3.2* 3.5   No results for input(s): LIPASE, AMYLASE in the last 168 hours. No results for input(s): AMMONIA in the last 168 hours. Coagulation Profile: No results for input(s): INR, PROTIME in the last 168 hours. Cardiac Enzymes: No results for input(s): CKTOTAL, CKMB, CKMBINDEX, TROPONINI in the last 168 hours. BNP (last 3 results) No results for input(s): PROBNP in the last 8760 hours. HbA1C: No results for input(s): HGBA1C in the last 72 hours. CBG: No results for input(s): GLUCAP in the last  168 hours. Lipid Profile: No results for input(s): CHOL, HDL, LDLCALC, TRIG, CHOLHDL, LDLDIRECT in the last 72 hours. Thyroid Function Tests: Recent Labs    12/23/2021 1817  TSH 2.271   Anemia Panel: Recent Labs    01/04/22 0316  FERRITIN 48  TIBC 375  IRON 26*   Urine analysis:    Component Value Date/Time   COLORURINE YELLOW 12/20/2021 2046   APPEARANCEUR CLEAR 12/20/2021 2046   LABSPEC 1.025 12/27/2021 2046   PHURINE 5.5 12/30/2021 2046   GLUCOSEU NEGATIVE 12/17/2021 2046   GLUCOSEU NEGATIVE 10/29/2019 [place] 12/18/2021 2046   BILIRUBINUR NEGATIVE 12/14/2021 2046   KETONESUR NEGATIVE 12/11/2021 2046   PROTEINUR NEGATIVE 12/27/2021 2046   UROBILINOGEN 0.2 10/29/2019 1135   NITRITE NEGATIVE 12/27/2021 2046   LEUKOCYTESUR NEGATIVE 12/08/2021 2046    Radiological Exams on Admission: DG Chest Portable 1 View  Result Date: 12/28/2021 CLINICAL DATA:  Short of breath, not feeling well for 2 weeks EXAM: PORTABLE CHEST 1 VIEW COMPARISON:  12/09/2021 FINDINGS: Single frontal view of the chest demonstrates a stable cardiac silhouette. Persistent bilateral pleural effusions, left greater than right, minimally increased since prior study. Increased consolidation at the  left lung base may reflect airspace disease or atelectasis. Diffuse interstitial prominence is again noted, with slight increase in interstitial and ground-glass opacities at the lung bases consistent with worsening of volume status. No pneumothorax. No acute bony abnormality. IMPRESSION: 1. Worsening volume status, with developing bibasilar edema and slight enlargement of bilateral pleural effusions. Electronically Signed   By: Randa Ngo M.D.   On: 12/06/2021 17:59     EKG: Independently reviewed, with result as described above.    Assessment/Plan   Principal Problem:   Acute on chronic combined systolic and diastolic CHF (congestive heart failure) (HCC) Active Problems:   HLD (hyperlipidemia)    Atrial fibrillation (HCC)   COPD (chronic obstructive pulmonary disease) (HCC)   Allergic rhinitis   SOB (shortness of breath)   AKI (acute kidney injury) ([place])    #) Acute on chronic systolic/diastolic heart failure: dx of acute decompensation on the basis of presenting 4 to 5 days of progressive shortness of breath associate with orthopnea, worsening of peripheral edema, with chest x-ray showing interval worsening of bilateral pulmonary edema as well as interval increase in size of bilateral pleural effusions, as further described above. This is in the context of a known history of chronic systolic/diastolic heart failure, with most recent echocardiogram performed in December 2022, which was notable for LVEF 25 to 30%, global left ventricular hypokinesis, and grade 2 diastolic dysfunction. Etiology leading to presenting acutely decompensated heart failure unclear. Patient conveys good compliance with home diuretic therapy, which consists of Lasix 40 mg subcu daily, as well as good compliance with their additional home cardiac medications, which include metoprolol succinate.  Of note, outpatient diuresis efforts have been complicated by borderline hypotension, as further detailed above . overall, ACS leading to presenting acutely decompensated heart failure appears less likely at this time in the absence of any recent CP, presenting EKG showing no evidence of acute ischemic changes, as well as non-elevated troponin and spite of aforementioned duration of the patient's symptoms.   Of note, patient received Lasix 80 mg IV x1 while in the ED today. Presentation warrants additional IV diuresis, as further detailed below, with close monitoring of ensuing renal function, electrolytes,, volume status, and blood pressure as further noted below. Will also closely monitor ensuing HR as an additional means to evaluate volume status and help guide subsequent diuresis decision-making. As patient is already on a BB at  home, will plan to continue this.  Of note, cardiology has been consulted.  Of note, the patient is not currently on an ACE inhibitor/ARB nor Isordil/hydralazine as an outpatient,  likely because of the aforementioned ongoing borderline soft blood pressures.    Plan: monitor strict I's & O's and daily weights. Monitor on telemetry, including trend in HR in response to diuresis, as above. Monitor continuous pulse oximetry. Repeat BMP in the morning, including for monitoring trend of potassium, bicarbonate, and renal function in response to interval diuresis efforts. Add-on serum magnesium level, and repeat this level in the AM. Close monitoring of ensuing blood pressure response to diuresis efforts.  Lasix 20 mg IV twice daily, with next dose to occur in the morning, with close monitoring of interval blood pressure.  Audiology consult.    #) Acute kidney injury: Presenting serum creatinine 1.73 relative to her baseline range of 1.2-1.3, with prior value noted to be 1.17 on 12/09/2021.  Suspect that this is prerenal in nature, in the setting of diminished renal perfusion gradient as a consequence of acute on chronic systolic/diastolic  heart failure, as above.  Of note, urinalysis with microscopy from this evening showed no evidence of white blood cell or red blood cell casts, nor any evidence of protein.  Plan: Further evaluation management of presenting acute on chronic systolic/diastolic heart failure, as above, including plan for IV diuresis.  Monitor strict I's and O's and daily weights.  Tempt avoid nephrotoxic agents.  Add on serum magnesium level.  Repeat CMP in the morning.   #) Paroxysmal atrial fibrillation: Documented history of such. In setting of CHA2DS2-VASc score of  8, there is an indication for chronic anticoagulation for thromboembolic prophylaxis.  It does not appear that she is chronically anticoagulated, and rationale for this is not currently clear to me at this time.  Home AV nodal  blocking regimen: Outpatient beta-blocker.  Most recent echocardiogram performed in December 2022, as further detailed above. Presenting EKG suggestive of rate controlled atrial fibrillation.  There is also documentation of complicating factor of sick sinus syndrome, for which she has been following with electrophysiology cardiology as an outpatient.   Plan: monitor strict I's & O's and daily weights. Repeat BMP/CBC in AM. Check serum mag level. Continue home AV nodal blocking regimen.  Monitor on telemetry.    #) COPD: Documented history of such, with the patient acknowledging that she is a former smoker, and completely quit smoking approximately 25 years ago.  Follows close with outpatient pulmonology, who feel that patient's recent shortness of breath is more on the basis of heart failure then underlying COPD, as above.  Patient ports good compliance with outpatient respiratory regimen which includes scheduled Trelegy as well as prn albuterol inhaler.  Overall, no clinical evidence to suggest acute COPD exacerbation at this time.  Plan: Continue outpatient Trelegy as well as as needed albuterol.  Monitor continuous pulse oximetry.  Add on serum magnesium level and check serum phosphorus level.   #) Hyperlipidemia: documented h/o such.  In the setting of a documented history of statin intolerance, she is on Zetia as outpatient.    Plan: continue home Zetia.    #) Anemia of chronic disease: Documented history of such, with baseline hemoglobin appearing to be approximately 12, with presenting hemoglobin slightly lower than his baseline, with presenting value noted to be 10.8, potentially representing a hemodelusional contribution in the setting of suspected acute volume overload as a consequence of presenting acute on chronic systolic/diastolic heart failure, as above.  No evidence of active bleed at this time.  She is on daily oral iron supplementation as outpatient.  Plan: Repeat CBC in the  morning.  Continue home daily oral iron supplementation.   #) Allergic Rhinitis: documented h/o such, on scheduled intranasal Flonase as outpatient as well as loratadine.    Plan: cont home Flonase and loratadine.    #) acquired hypothyroidism: documented h/o such, on Synthroid as outpatient.   Plan: cont home Synthroid.    #) GERD: documented h/o such; on omeprazole as outpatient.   Plan: continue home PPI.    Further recommendations pending on overall hospital course      Cyleigh Massaro A MD [facility]

## 2022-01-04 NOTE — ED Notes (Signed)
Pt c/o diarrhea 4 times today. Admitting MD paged.

## 2022-01-04 NOTE — Progress Notes (Signed)
Visited with patient per request. Pt. Requested prayer and chaplain presence.  Prayed with patient and family at bedside. Also listened to patient concerns and provided support accordingly. Chaplain available as needed.  Jaclynn Major, [place], [facility], Pager [phone]

## 2022-01-04 NOTE — ED Notes (Signed)
Breakfast orders placed 

## 2022-01-04 NOTE — ED Notes (Signed)
Pt placed on hospital bed for comfort.

## 2022-01-04 NOTE — ED Notes (Signed)
Patient placed on hospital bed for comfort °

## 2022-01-05 ENCOUNTER — Inpatient Hospital Stay (HOSPITAL_COMMUNITY): Payer: Medicare Other

## 2022-01-05 ENCOUNTER — Inpatient Hospital Stay: Payer: Self-pay

## 2022-01-05 DIAGNOSIS — I480 Paroxysmal atrial fibrillation: Secondary | ICD-10-CM

## 2022-01-05 DIAGNOSIS — I340 Nonrheumatic mitral (valve) insufficiency: Secondary | ICD-10-CM

## 2022-01-05 DIAGNOSIS — N179 Acute kidney failure, unspecified: Secondary | ICD-10-CM | POA: Diagnosis not present

## 2022-01-05 DIAGNOSIS — I5043 Acute on chronic combined systolic (congestive) and diastolic (congestive) heart failure: Secondary | ICD-10-CM | POA: Diagnosis not present

## 2022-01-05 LAB — CBC WITH DIFFERENTIAL/PLATELET
Abs Immature Granulocytes: 0.03 10*3/uL (ref 0.00–0.07)
Basophils Absolute: 0 10*3/uL (ref 0.0–0.1)
Basophils Relative: 1 %
Eosinophils Absolute: 0 10*3/uL (ref 0.0–0.5)
Eosinophils Relative: 1 %
HCT: 38.9 % (ref 36.0–46.0)
Hemoglobin: 12.3 g/dL (ref 12.0–15.0)
Immature Granulocytes: 0 %
Lymphocytes Relative: 17 %
Lymphs Abs: 1.2 10*3/uL (ref 0.7–4.0)
MCH: 26.7 pg (ref 26.0–34.0)
MCHC: 31.6 g/dL (ref 30.0–36.0)
MCV: 84.4 fL (ref 80.0–100.0)
Monocytes Absolute: 1.1 10*3/uL — ABNORMAL HIGH (ref 0.1–1.0)
Monocytes Relative: 15 %
Neutro Abs: 4.7 10*3/uL (ref 1.7–7.7)
Neutrophils Relative %: 66 %
Platelets: 205 10*3/uL (ref 150–400)
RBC: 4.61 MIL/uL (ref 3.87–5.11)
RDW: 16.3 % — ABNORMAL HIGH (ref 11.5–15.5)
WBC: 7.2 10*3/uL (ref 4.0–10.5)
nRBC: 0 % (ref 0.0–0.2)

## 2022-01-05 LAB — BASIC METABOLIC PANEL
Anion gap: 9 (ref 5–15)
BUN: 25 mg/dL — ABNORMAL HIGH (ref 8–23)
CO2: 32 mmol/L (ref 22–32)
Calcium: 9.3 mg/dL (ref 8.9–10.3)
Chloride: 97 mmol/L — ABNORMAL LOW (ref 98–111)
Creatinine, Ser: 1.53 mg/dL — ABNORMAL HIGH (ref 0.44–1.00)
GFR, Estimated: 34 mL/min — ABNORMAL LOW (ref >60)
Glucose, Bld: 127 mg/dL — ABNORMAL HIGH (ref 70–99)
Potassium: 3.5 mmol/L (ref 3.5–5.1)
Sodium: 138 mmol/L (ref 135–145)

## 2022-01-05 LAB — LACTIC ACID, PLASMA
Lactic Acid, Venous: 1.3 mmol/L (ref 0.5–1.9)
Lactic Acid, Venous: 1.3 mmol/L (ref 0.5–1.9)

## 2022-01-05 MED ORDER — AMIODARONE HCL IN DEXTROSE 360-4.14 MG/200ML-% IV SOLN
30.0000 mg/h | INTRAVENOUS | Status: DC
  Administered 2022-01-05: 60 mg/h via INTRAVENOUS
  Administered 2022-01-06 – 2022-01-09 (×6): 30 mg/h via INTRAVENOUS
  Administered 2022-01-09 – 2022-01-16 (×23): 60 mg/h via INTRAVENOUS
  Administered 2022-01-16: 30 mg/h via INTRAVENOUS
  Administered 2022-01-16: 60 mg/h via INTRAVENOUS
  Administered 2022-01-17 – 2022-01-19 (×4): 30 mg/h via INTRAVENOUS
  Filled 2022-01-05 (×46): qty 200

## 2022-01-05 MED ORDER — AMIODARONE HCL IN DEXTROSE 360-4.14 MG/200ML-% IV SOLN
INTRAVENOUS | Status: AC
  Filled 2022-01-05: qty 200

## 2022-01-05 MED ORDER — AMIODARONE LOAD VIA INFUSION
150.0000 mg | Freq: Once | INTRAVENOUS | Status: AC
  Administered 2022-01-05: 150 mg via INTRAVENOUS
  Filled 2022-01-05: qty 83.34

## 2022-01-05 MED ORDER — POTASSIUM CHLORIDE CRYS ER 20 MEQ PO TBCR
40.0000 meq | EXTENDED_RELEASE_TABLET | Freq: Once | ORAL | Status: AC
  Administered 2022-01-05: 40 meq via ORAL
  Filled 2022-01-05: qty 2

## 2022-01-05 MED ORDER — AMIODARONE HCL IN DEXTROSE 360-4.14 MG/200ML-% IV SOLN
60.0000 mg/h | INTRAVENOUS | Status: AC
  Administered 2022-01-05: 60 mg/h via INTRAVENOUS

## 2022-01-05 MED ORDER — IPRATROPIUM BROMIDE 0.02 % IN SOLN
0.5000 mg | Freq: Four times a day (QID) | RESPIRATORY_TRACT | Status: DC | PRN
  Administered 2022-01-09 – 2022-01-19 (×6): 0.5 mg via RESPIRATORY_TRACT
  Filled 2022-01-05 (×6): qty 2.5

## 2022-01-05 MED ORDER — SODIUM CHLORIDE 0.9% FLUSH
10.0000 mL | Freq: Two times a day (BID) | INTRAVENOUS | Status: DC
  Administered 2022-01-05: 20 mL
  Administered 2022-01-06 – 2022-01-19 (×21): 10 mL

## 2022-01-05 MED ORDER — CHLORHEXIDINE GLUCONATE CLOTH 2 % EX PADS
6.0000 | MEDICATED_PAD | Freq: Every day | CUTANEOUS | Status: DC
  Administered 2022-01-05 – 2022-01-19 (×12): 6 via TOPICAL

## 2022-01-05 MED ORDER — SODIUM CHLORIDE 0.9% FLUSH: 10.0000 mL | INTRAVENOUS | Status: DC | PRN

## 2022-01-05 MED ORDER — LORAZEPAM 1 MG PO TABS
0.5000 mg | ORAL_TABLET | Freq: Once | ORAL | Status: AC
  Administered 2022-01-05: 0.5 mg via ORAL
  Filled 2022-01-05: qty 1

## 2022-01-05 MED ORDER — HEPARIN (PORCINE) 25000 UT/250ML-% IV SOLN: 800.0000 [IU]/h | INTRAVENOUS | Status: DC

## 2022-01-05 MED ORDER — TRAZODONE HCL 50 MG PO TABS
50.0000 mg | ORAL_TABLET | Freq: Every evening | ORAL | Status: DC | PRN
  Administered 2022-01-05 – 2022-01-12 (×8): 50 mg via ORAL
  Filled 2022-01-05 (×9): qty 1

## 2022-01-05 MED ORDER — MAGNESIUM SULFATE 2 GM/50ML IV SOLN
2.0000 g | Freq: Once | INTRAVENOUS | Status: AC
  Administered 2022-01-05: 2 g via INTRAVENOUS
  Filled 2022-01-05: qty 50

## 2022-01-05 MED ORDER — HEPARIN (PORCINE) 25000 UT/250ML-% IV SOLN
INTRAVENOUS | Status: AC
  Administered 2022-01-05: 600 [IU]/h via INTRAVENOUS
  Filled 2022-01-05: qty 250

## 2022-01-05 MED ORDER — ALPRAZOLAM 0.5 MG PO TABS
0.2500 mg | ORAL_TABLET | Freq: Once | ORAL | Status: AC
  Administered 2022-01-05: 0.25 mg via ORAL
  Filled 2022-01-05: qty 1

## 2022-01-05 NOTE — Progress Notes (Signed)
ANTICOAGULATION CONSULT NOTE - Initial Consult  Pharmacy Consult for Heparin  Indication: atrial fibrillation  Allergies  Allergen Reactions   Clarithromycin Other (See Comments)    REACTION: possible rash but could have been legionarres dz with rash   Lipitor [Atorvastatin] Other (See Comments)    MUSCLE SPASMS   Simvastatin Other (See Comments)    Muscle and joint pain   Cardizem [Diltiazem Hcl] Hives   Contrast Media [Iodinated Contrast Media]     FYI - during admission at [facility] 08/2016 patient developed hives, question related to cardizem or contrast dye - not clear   Diltiazem     Other reaction(s): rash   Statins     Other reaction(s): leg cramps   Rosuvastatin Calcium Other (See Comments)    fagitue and joint pain, "NO STATINS"    Patient Measurements: Height: 5\' 4"  (162.6 cm) Weight: 52.2 kg (115 lb 1.3 oz) IBW/kg (Calculated) : 54.7  Vital Signs: Temp: 98 F (36.7 C) (02/01 1352) Temp Source: Oral (02/01 1352) BP: 101/70 (02/01 1200) Pulse Rate: 105 (02/01 1200)  Labs: Recent Labs    01/02/2022 1913 12/22/2021 2113 01/04/22 0316 01/05/22 1011  HGB 10.8*  --  11.1* 12.3  HCT 34.8*  --  37.0 38.9  PLT 154  --  174 205  CREATININE 1.73*  --  1.58* 1.53*  TROPONINIHS 13 13  --   --     Estimated Creatinine Clearance: 23.4 mL/min (A) (by C-G formula based on SCr of 1.53 mg/dL (H)).   Medical History: Past Medical History:  Diagnosis Date   Allergic rhinitis    Anxiety    Atrial septal aneurysm    Basal cell carcinoma    Bradycardia    Cataract    bil cataracts removed   Chronic combined systolic and diastolic CHF (congestive heart failure) (HCC)    CKD (chronic kidney disease), stage III (HCC)    Clostridium difficile infection    COPD (chronic obstructive pulmonary disease) ([place])    Depression 11/29/2013   DI (detrusor instability)    Esophageal stricture    Hemorrhoids    Hiatal hernia    History of kidney stones    HLD  (hyperlipidemia)    HTN (hypertension)    Ischemic colon ([place])    a. 08/2016 - adm to  [facility] with a ruptured colon s/p right hemicolectomy with ileostomy formation for ischemic and necrotic colon.    Lung nodule    a. followed by pulmonology   Mild CAD    a. minor nonobstructive CAD 08/22/16.   Nephrolithiasis    hx   NICM (nonischemic cardiomyopathy) (HCC)    Orthostatic hypotension    Osteoarthritis    PAF (paroxysmal atrial fibrillation) ([place])    a. diagnosed 07/2016 s/p DCCV 08/01/16 with recurrence, managed with rate control for now.    Pneumonia 2013   PVC's (premature ventricular contractions)    Shingles since Nov 18, 2013   on right arm from shoulder to wrist   Status post dilation of esophageal narrowing    Stroke [facility]) 2001   stroke affected left leg   Syncope    a. 08/2016 felt due to orthostatic hypotension.   TIA (transient ischemic attack) last 2001   anticoagulation therapy on coumadin      Assessment: 80yof with hisptory of PAF on apixaban 2.5mg  BID pta, admitted with Afib RVR and SOB.   Will start heparin drip and use aptt to dose heparin for now until apixaban  cleared- apixaban falsely elevates heparin levels No bleeding noted, hgb 12 low FE stores will replace   Goal of Therapy:   Monitor platelets by anticoagulation protocol: Yes   Plan:  Heparin drip 600 uts/hr Aptt and heparin level 6hr after start  Daily aptt and heparin level  Monitor s/s bleeding    Bonnita Nasuti Pharm.D. CPP, BCPS Clinical Pharmacist [phone] 01/05/2022 4:33 PM

## 2022-01-05 NOTE — Progress Notes (Signed)
HPI: [name] is a 81 y.o. female with medical history significant for chronic systolic/diastolic heart failure, with most recent LVEF 25 to 30%, paroxysmal atrial fibrillation complicated by sick sinus syndrome, COPD, hyperlipidemia, anemia of chronic disease, who is admitted to [facility] on 12/17/2021 with acute on chronic systolic/diastolic heart failure after presenting from home to [facility] ED complaining of shortness of breath.   The patient reports 4 to 5 days of progressive shortness of breath associated orthopnea and worsening of edema in the bilateral lower extremities.  She reports associated nonproductive cough in the absence of any associated subjective fever, chills, rigors, or generalized myalgias.  Denies any associated chest pain, palpitations, diaphoresis, nausea, vomiting, dizziness, presyncope, or syncope.  Denies any new onset calf tenderness or new lower extremity erythema.  No hemoptysis.  Also denies any recent melena or hematochezia.  No recent rhinitis, rhinorrhea, sore throat, wheezing, rash.  She reports good compliance with her outpatient diuretic regimen which consists of Lasix 40 mg p.o. daily.  She confirms a history of chronic systolic/diastolic heart failure, with most recent echocardiogram from 11/26/2021 notable for LVEF 25 to 30% with global left ventricular hypokinesis, grade 2 diastolic dysfunction.  Per chart review, it appears that the nature of her chronic heart failure is felt to be on the basis of nonischemic cardiomyopathy.  She also has a history of COPD acknowledging that she is a former smoker, and completely quit smoking approximately 25 years ago.  She follows with pulmonology as an outpatient, with recent associated appointment revealing pulmonology's opinion that her progressive shortness of breath is more cardiac than pulmonary in primary origin at this point.   Her diuresis as an outpatient has been complicated by intermittent hypotension.   Additionally, per chart review, her baseline serum creatinine appears to be 1.2-1.3, with most recent prior value noted to be 1.17 on 12/09/2021.   ED Course:  Vital signs in the ED were notable for the following: Afebrile; heart rate 93-1 05; blood pressure 87/62 - 106/82, respiratory rate 14-24, oxygen saturation 97 to 100% on room air.  Labs were notable for the following: CMP was notable for the following: Sodium 135, potassium 4.1, creatinine 1.73.  High-sensitivity troponin I x2 values were both found to be 13.  BNP 2800 compared to most recent prior value of 340 in 2017.  CBC notable for episode count 5200, hemoglobin 10.8 relative most recent prior value of 12 in April 2022.  Urinalysis showed no white blood cells, no red blood cells, and was negative for protein.  COVID-19/influenza PCR found to be negative.  Imaging and additional notable ED work-up: EKG shows atrial fibrillation with interventricular conduction delay, heart rate 108, no evidence of T wave or ST changes, including no evidence of ST elevation.  Chest x-ray, in comparison to most recent prior chest x-ray from 12/09/2021 showed interval worsening of bilateral pulmonary edema as well as interval increase in size of the bilateral pleural effusions, while demonstrating no evidence of infiltrate or pneumothorax.  EDP discussed the patient's case with on-call cardiology, who will formally consult, with additional recommendations pending at this time.  While in the ED, the following were administered: Lasix 80 mg IV x1.  Subsequently, the patient was admitted to the PCU for further evaluation management of presenting acute on chronic systolic/diastolic heart failure complicated by borderline hypotension as well as acute kidney injury.  Subjective Shortness of breath better.  Patient very anxious and claustrophobic and is ready to walk  around.  Wanting to walk in the hallway.  Objective    Physical Exam: Vitals:   01/05/22 0500  01/05/22 0600 01/05/22 0700 01/05/22 0800  BP: 90/76 (!) 85/62 (!) 78/52 102/86  Pulse: 95 (!) 32 (!) 112   Resp: 15 19 20  (!) 26  Temp:      TempSrc:      SpO2: 96% 99% 98% 94%    General: appears to be stated age; alert, oriented; mildly increased work of breathing noted Skin: warm, dry, no rash Head:  AT/[place] Mouth:  Oral mucosa membranes appear moist, normal dentition Neck: supple; trachea midline Heart:  RRR; did not appreciate any M/R/G Lungs: Bibasilar crackles noted, but otherwise CTAB, did not appreciate any wheezes,or rhonchi Abdomen: + BS; soft, ND, NT Vascular: 2+ pedal pulses b/l; 2+ radial pulses b/l Extremities: 1-2+ edema in the bilateral lower extremities, no muscle wasting Neuro: strength and sensation intact in upper and lower extremities b/l     Labs on Admission: I have personally reviewed following labs and imaging studies  CBC: Recent Labs  Lab 12/28/2021 1913 01/04/22 0316 01/05/22 1011  WBC 5.2 6.3 7.2  NEUTROABS 3.4 4.1 4.7  HGB 10.8* 11.1* 12.3  HCT 34.8* 37.0 38.9  MCV 85.1 85.6 84.4  PLT 154 174 341   Basic Metabolic Panel: Recent Labs  Lab 12/30/2021 1913 01/04/22 0316 01/05/22 1011  NA 135 138 138  K 4.1 4.1 3.5  CL 98 101 97*  CO2 27 27 32  GLUCOSE 94 83 127*  BUN 30* 29* 25*  CREATININE 1.73* 1.58* 1.53*  CALCIUM 8.8* 8.9 9.3  MG  --  1.7  --   PHOS  --  3.6  --    GFR: Estimated Creatinine Clearance: 23.5 mL/min (A) (by C-G formula based on SCr of 1.53 mg/dL (H)). Liver Function Tests: Recent Labs  Lab 12/19/2021 1913 01/04/22 0316  AST 19 19  ALT 18 19  ALKPHOS 88 92  BILITOT 0.4 0.6  PROT 5.4* 5.8*  ALBUMIN 3.2* 3.5   No results for input(s): LIPASE, AMYLASE in the last 168 hours. No results for input(s): AMMONIA in the last 168 hours. Coagulation Profile: No results for input(s): INR, PROTIME in the last 168 hours. Cardiac Enzymes: No results for input(s): CKTOTAL, CKMB, CKMBINDEX, TROPONINI in the last 168  hours. BNP (last 3 results) No results for input(s): PROBNP in the last 8760 hours. HbA1C: No results for input(s): HGBA1C in the last 72 hours. CBG: No results for input(s): GLUCAP in the last 168 hours. Lipid Profile: No results for input(s): CHOL, HDL, LDLCALC, TRIG, CHOLHDL, LDLDIRECT in the last 72 hours. Thyroid Function Tests: Recent Labs    12/06/2021 1817  TSH 2.271   Anemia Panel: Recent Labs    01/04/22 0316  FERRITIN 48  TIBC 375  IRON 26*   Urine analysis:    Component Value Date/Time   COLORURINE YELLOW 12/14/2021 2046   APPEARANCEUR CLEAR 12/26/2021 2046   LABSPEC 1.025 12/31/2021 2046   PHURINE 5.5 01/02/2022 2046   GLUCOSEU NEGATIVE 12/23/2021 2046   GLUCOSEU NEGATIVE 10/29/2019 [place] 12/13/2021 2046   BILIRUBINUR NEGATIVE 01/02/2022 2046   KETONESUR NEGATIVE 12/23/2021 2046   PROTEINUR NEGATIVE 12/23/2021 2046   UROBILINOGEN 0.2 10/29/2019 1135   NITRITE NEGATIVE 12/10/2021 2046   LEUKOCYTESUR NEGATIVE 12/24/2021 2046    Radiological Exams on Admission: DG Chest Portable 1 View  Result Date: 12/14/2021 CLINICAL DATA:  Short of breath, not feeling well  for 2 weeks EXAM: PORTABLE CHEST 1 VIEW COMPARISON:  12/09/2021 FINDINGS: Single frontal view of the chest demonstrates a stable cardiac silhouette. Persistent bilateral pleural effusions, left greater than right, minimally increased since prior study. Increased consolidation at the left lung base may reflect airspace disease or atelectasis. Diffuse interstitial prominence is again noted, with slight increase in interstitial and ground-glass opacities at the lung bases consistent with worsening of volume status. No pneumothorax. No acute bony abnormality. IMPRESSION: 1. Worsening volume status, with developing bibasilar edema and slight enlargement of bilateral pleural effusions. Electronically Signed   By: Randa Ngo M.D.   On: 12/11/2021 17:59     EKG: Independently reviewed, with  result as described above.    Assessment/Plan   Principal Problem:   Acute on chronic combined systolic and diastolic CHF (congestive heart failure) (HCC) Active Problems:   HLD (hyperlipidemia)   Atrial fibrillation (HCC)   COPD (chronic obstructive pulmonary disease) (HCC)   Allergic rhinitis   SOB (shortness of breath)   AKI (acute kidney injury) ([place])    #) Acute on chronic systolic/diastolic heart failure: dx of acute decompensation on the basis of presenting 4 to 5 days of progressive shortness of breath associate with orthopnea, worsening of peripheral edema, with chest x-ray showing interval worsening of bilateral pulmonary edema as well as interval increase in size of bilateral pleural effusions, as further described above. This is in the context of a known history of chronic systolic/diastolic heart failure, with most recent echocardiogram performed in December 2022, which was notable for LVEF 25 to 30%, global left ventricular hypokinesis, and grade 2 diastolic dysfunction. Etiology leading to presenting acutely decompensated heart failure unclear. Patient conveys good compliance with home diuretic therapy, which consists of Lasix 40 mg subcu daily, as well as good compliance with their additional home cardiac medications, which include metoprolol succinate.  Of note, outpatient diuresis efforts have been complicated by borderline hypotension, as further detailed above . overall, ACS leading to presenting acutely decompensated heart failure appears less likely at this time in the absence of any recent CP, presenting EKG showing no evidence of acute ischemic changes, as well as non-elevated troponin and spite of aforementioned duration of the patient's symptoms.   Of note, patient received Lasix 80 mg IV x1 while in the ED today. Presentation warrants additional IV diuresis, as further detailed below, with close monitoring of ensuing renal function, electrolytes,, volume status, and  blood pressure as further noted below. Will also closely monitor ensuing HR as an additional means to evaluate volume status and help guide subsequent diuresis decision-making. As patient is already on a BB at home, will plan to continue this.  Of note, cardiology has been consulted.  Of note, the patient is not currently on an ACE inhibitor/ARB nor Isordil/hydralazine as an outpatient,  likely because of the aforementioned ongoing borderline soft blood pressures.    Plan: Seems to be improving.  Continue diuresis.  Follow-up on final cardiology recommendations.  Severe mitral regurg may need clip at some point.   #) Acute kidney injury: Presenting serum creatinine 1.73 relative to her baseline range of 1.2-1.3, with prior value noted to be 1.17 on 12/09/2021.  Suspect that this is prerenal in nature, in the setting of diminished renal perfusion gradient as a consequence of acute on chronic systolic/diastolic heart failure, as above.  Of note, urinalysis with microscopy from this evening showed no evidence of white blood cell or red blood cell casts, nor any evidence of protein.  Plan: Further evaluation management of presenting acute on chronic systolic/diastolic heart failure, as above, including plan for IV diuresis.  Monitor strict I's and O's and daily weights.  Tempt avoid nephrotoxic agents.  Add on serum magnesium level.  Repeat CMP in the morning.   #) Paroxysmal atrial fibrillation: Documented history of such. In setting of CHA2DS2-VASc score of  8, there is an indication for chronic anticoagulation for thromboembolic prophylaxis.  It does not appear that she is chronically anticoagulated, and rationale for this is not currently clear to me at this time.  Home AV nodal blocking regimen: Outpatient beta-blocker.  Most recent echocardiogram performed in December 2022, as further detailed above. Presenting EKG suggestive of rate controlled atrial fibrillation.  There is also documentation of  complicating factor of sick sinus syndrome, for which she has been following with electrophysiology cardiology as an outpatient.   Plan: monitor strict I's & O's and daily weights. Repeat BMP/CBC in AM. Check serum mag level. Continue home AV nodal blocking regimen.  Monitor on telemetry.    #) COPD: Documented history of such, with the patient acknowledging that she is a former smoker, and completely quit smoking approximately 25 years ago.  Follows close with outpatient pulmonology, who feel that patient's recent shortness of breath is more on the basis of heart failure then underlying COPD, as above.  Patient ports good compliance with outpatient respiratory regimen which includes scheduled Trelegy as well as prn albuterol inhaler.  Overall, no clinical evidence to suggest acute COPD exacerbation at this time.  Plan: Continue outpatient Trelegy as well as as needed albuterol.  Monitor continuous pulse oximetry.  Add on serum magnesium level and check serum phosphorus level.   #) Hyperlipidemia: documented h/o such.  In the setting of a documented history of statin intolerance, she is on Zetia as outpatient.    Plan: continue home Zetia.    #) Anemia of chronic disease: Documented history of such, with baseline hemoglobin appearing to be approximately 12, with presenting hemoglobin slightly lower than his baseline, with presenting value noted to be 10.8, potentially representing a hemodelusional contribution in the setting of suspected acute volume overload as a consequence of presenting acute on chronic systolic/diastolic heart failure, as above.  No evidence of active bleed at this time.  She is on daily oral iron supplementation as outpatient.  Plan: Repeat CBC in the morning.  Continue home daily oral iron supplementation.   #) Allergic Rhinitis: documented h/o such, on scheduled intranasal Flonase as outpatient as well as loratadine.    Plan: cont home Flonase and loratadine.     #) acquired hypothyroidism: documented h/o such, on Synthroid as outpatient.   Plan: cont home Synthroid.    #) GERD: documented h/o such; on omeprazole as outpatient.   Plan: continue home PPI.   Chronic respiratory failure-on 3 L of oxygen at home chronically  Obtain physical therapy evaluation   Further recommendations pending on overall hospital course      Azuree Minish A MD [facility]

## 2022-01-05 NOTE — Consult Note (Addendum)
Advanced Heart Failure Team Consult Note   Primary Physician: Biagio Borg, MD PCP-Cardiologist:  Constance Haw, MD Pulmonary: Dr Valeta Harms   Reason for Consultation: Heart Failure   HPI:    [name] is seen today for evaluation of heart failure  at the request of Dr Acie Fredrickson.   [name] is a 81 year old with a history of PAF, moderate AS, severe MR, COPD, quit smoking 25 years ago, PAD, HTN, hyperlipidemia, CVA, NICM, tobacco abuse, ischemic bowel with resection in 2017,11/04/22 left ureteral calculus S/P utreteroscopy and stent,  and chronic HFrEF.    Saw EP 10/18/21. Eliquis dose was dropped to 2.5 mg twice a day (weight & age). No EKG during office visit.   Had ECHO 11/26/2021 with EF 25-30%, severe MR, and moderate AS.Set up TEE. She was in NSR at that time.   Saw Pulmonary 12/09/21 due to increased shortness of breath. CXR with pulmonary edema. Treated for COPD exacerbation with steroid injection followed by prednisone taper. Duoneb given in office and started on augementin. Also had po lasix increased to 40 mg daily. Had VQ scan negative for PE. Had f/u in pulmonary 12/17/21 and had felt better. Oxygen saturations < 88% with exertion. Started on home oxygen. Creatinine 1.2.   Had TEE on 12/28/21 with EF 25%, severe MR and mild -mod AS.   Saw Pulmonary on 12/31/21 in f/u. SBP 80/52. Pulse 103.COPD felt to be stable on triple therapy inhalers. Also recommendations to f/u with cardiology.   [facility]  on 12/06/2021 with increased shortness of breath and hypotension. (Last dose of eliquis was 12/23/2021) EKG on arrival A fib RVR. CXR with worsening volume status, edema and bilateral pleural effusions. Given IV lasix. Today she is complaining fatigue.  Structural Heart Team consulted for possible Mitral Clip. Anticoagulants not ordered.   Pertinent admission labs: TSH 2.2, creatinine 1.7, K 4.1, hgb 10.6 , BNP 2812, HS Trop 13>13  No anticoagulation ordered.    Creatinine trend 1.7>1.6>1.5   Cardiac Testing  - Echo 11/2021 EF 25-30%, RV normal, LA severely dilated, severe MR, and grade II DD.  - Echo 2018 EF 45-50%   - TEE 12/28/21 Severe MR- MV Peak  -LHC 2017 Prox LAD to Mid LAD lesion, 15 %stenosed. Ost Cx to Prox Cx lesion, 15 %stenosed. Prox RCA to Mid RCA lesion, 20 %stenosed. There is mild left ventricular systolic dysfunction. LV end diastolic pressure is normal. The left ventricular ejection fraction is 45-50% by visual estimat  Review of Systems: [y] = yes, [ ]  = no   General: Weight gain [ ] ; Weight loss [ ] ; Anorexia [ ] ; Fatigue [ Y]; Fever [ ] ; Chills [ ] ; Weakness [ Y]  Cardiac: Chest pain/pressure [ ] ; Resting SOB [ ] ; Exertional SOB [ Y]; Orthopnea [ ] ; Pedal Edema [ ] ; Palpitations [ ] ; Syncope [ ] ; Presyncope [ ] ; Paroxysmal nocturnal dyspnea[ ]   Pulmonary: Cough [ ] ; Wheezing[ ] ; Hemoptysis[ ] ; Sputum [ ] ; Snoring [ ]   GI: Vomiting[ ] ; Dysphagia[ ] ; Melena[ ] ; Hematochezia [ ] ; Heartburn[ ] ; Abdominal pain [ ] ; Constipation [ ] ; Diarrhea [ ] ; BRBPR [ ]   GU: Hematuria[ ] ; Dysuria [ ] ; Nocturia[ ]   Vascular: Pain in legs with walking [ ] ; Pain in feet with lying flat [ ] ; Non-healing sores [ ] ; Stroke [ ] ; TIA [ Y]; Slurred speech [ ] ;  Neuro: Headaches[ ] ; Vertigo[ ] ; Seizures[ ] ; Paresthesias[ ] ;Blurred vision [ ] ;  Diplopia [ ] ; Vision changes [ ]   Ortho/Skin: Arthritis [ ] ; Joint pain [ ] ; Muscle pain [ ] ; Joint swelling [ ] ; Back Pain [ Y]; Rash [ ]   Psych: Depression[ ] ; Anxiety[ Y]  Heme: Bleeding problems [ ] ; Clotting disorders [ ] ; Anemia [ ]   Endocrine: Diabetes [ ] ; Thyroid dysfunction[ Y]  Home Medications Prior to Admission medications   Medication Sig Start Date End Date Taking? Authorizing Provider  albuterol (VENTOLIN HFA) 108 (90 Base) MCG/ACT inhaler Inhale 1-2 puffs into the lungs every 6 (six) hours as needed for wheezing or shortness of breath. 12/08/20  Yes Parrett, Tammy S, NP  buPROPion  (WELLBUTRIN XL) 300 MG 24 hr tablet Take 1 tablet (300 mg total) by mouth daily. 10/26/21  Yes Biagio Borg, MD  Calcium Carbonate-Vitamin D (CALCIUM + D PO) Take 1 tablet by mouth 2 (two) times daily.   Yes [provider]  colestipol (COLESTID) 1 g tablet Take 1 g by mouth 2 (two) times daily.   Yes [provider]  ezetimibe (ZETIA) 10 MG tablet Take 1 tablet (10 mg total) by mouth daily. 10/26/21 01/24/22 Yes Biagio Borg, MD  ferrous sulfate 325 (65 FE) MG tablet Take 325 mg by mouth daily with breakfast.   Yes [provider]  fluticasone (FLONASE) 50 MCG/ACT nasal spray Place 1 spray into both nostrils 2 (two) times daily. 06/10/21  Yes Parrett, Tammy S, NP  Fluticasone-Umeclidin-Vilant (TRELEGY ELLIPTA) 100-62.5-25 MCG/INH AEPB Inhale 1 puff into the lungs daily. I inhalation once daily 12/08/20  Yes Parrett, Tammy S, NP  furosemide (LASIX) 20 MG tablet Take 2 tablets (40 mg total) by mouth daily. 12/17/21  Yes Cobb, Karie Schwalbe, NP  gabapentin (NEURONTIN) 300 MG capsule 1 tab by mouth in AM, 1 po at midday, then 2 by mouth at bedtime for sleep and pain Patient taking differently: Take 300-600 mg by mouth See admin instructions. Take 300 mg by mouth in the morning and 600 mg at night 06/17/21  Yes Biagio Borg, MD  guaiFENesin (MUCINEX) 600 MG 12 hr tablet Take 1,200 mg by mouth daily.   Yes [provider]  hydroxychloroquine (PLAQUENIL) 200 MG tablet Take 400 mg by mouth at bedtime.   Yes [provider]  ipratropium (ATROVENT) 0.03 % nasal spray Place 2 sprays into both nostrils 3 (three) times daily as needed for rhinitis. 06/10/21  Yes Parrett, Tammy S, NP  ipratropium-albuterol (DUONEB) 0.5-2.5 (3) MG/3ML SOLN Take 3 mLs by nebulization every 6 (six) hours as needed (shortness of breath or wheezing). 12/09/21  Yes Cobb, Karie Schwalbe, NP  levothyroxine (SYNTHROID) 25 MCG tablet Take 1 tablet (25 mcg total) by mouth daily before breakfast. Annual appt due  in Nov must see provider for future refills 12/24/20  Yes Biagio Borg, MD  Lidocaine HCl 2 % CREA Apply 1 application topically daily as needed (for shingles flares).   Yes [provider]  loratadine (CLARITIN) 10 MG tablet Take 10 mg by mouth daily.   Yes [provider]  Melatonin 10 MG TABS Take 10 mg by mouth at bedtime.   Yes [provider]  metoprolol succinate (TOPROL-XL) 100 MG 24 hr tablet Take 1 tablet (100 mg total) by mouth daily. Take with or immediately following a meal. Patient taking differently: Take 100 mg by mouth every evening. Take with or immediately following a meal. 09/24/21  Yes Camnitz, Will Hassell Done, MD  mirabegron ER (MYRBETRIQ) 50 MG TB24 tablet  Take 50 mg by mouth at bedtime.   Yes [provider]  montelukast (SINGULAIR) 10 MG tablet Take 1 tablet (10 mg total) by mouth daily. Annual appt due in Nov must see provider for future refills 11/05/21  Yes Biagio Borg, MD  Multiple Vitamin (MULTIVITAMIN) tablet Take 2 tablets by mouth daily.   Yes [provider]  omeprazole (PRILOSEC) 40 MG capsule Take 1 capsule (40 mg total) by mouth daily. Patient taking differently: Take 40 mg by mouth every evening. 05/28/21  Yes Biagio Borg, MD  Probiotic Product (PROBIOTIC DAILY PO) Take 1 capsule by mouth in the morning and at bedtime.   Yes [provider]  traZODone (DESYREL) 50 MG tablet Take 1 tablet (50 mg total) by mouth at bedtime and may repeat dose one time if needed. Patient taking differently: Take 100 mg by mouth at bedtime. 09/24/21  Yes Biagio Borg, MD  benzonatate (TESSALON) 200 MG capsule Take 1 capsule (200 mg total) by mouth 3 (three) times daily as needed for cough. Patient not taking: Reported on 01/04/2022 08/10/21 08/10/22  Parrett, Fonnie Mu, NP  Biotin 5 MG CAPS Take 5 mg by mouth daily.    [provider]  cyclobenzaprine (FLEXERIL) 5 MG tablet Take 1 tablet (5 mg total) by mouth 3 (three) times  daily as needed for muscle spasms. Patient not taking: Reported on 01/04/2022 10/17/18   Biagio Borg, MD  furosemide (LASIX) 40 MG tablet Take 1 tablet (40 mg total) by mouth daily. Patient not taking: Reported on 12/23/2021 12/13/21   Clayton Bibles, NP  promethazine-dextromethorphan (PROMETHAZINE-DM) 6.25-15 MG/5ML syrup Take 2.5 mLs by mouth 4 (four) times daily as needed for cough. Patient not taking: Reported on 01/04/2022 12/09/21   Clayton Bibles, NP    Past Medical History: Past Medical History:  Diagnosis Date   Allergic rhinitis    Anxiety    Atrial septal aneurysm    Basal cell carcinoma    Bradycardia    Cataract    bil cataracts removed   Chronic combined systolic and diastolic CHF (congestive heart failure) (HCC)    CKD (chronic kidney disease), stage III (HCC)    Clostridium difficile infection    COPD (chronic obstructive pulmonary disease) ([place])    Depression 11/29/2013   DI (detrusor instability)    Esophageal stricture    Hemorrhoids    Hiatal hernia    History of kidney stones    HLD (hyperlipidemia)    HTN (hypertension)    Ischemic colon ([place])    a. 08/2016 - adm to  [facility] with a ruptured colon s/p right hemicolectomy with ileostomy formation for ischemic and necrotic colon.    Lung nodule    a. followed by pulmonology   Mild CAD    a. minor nonobstructive CAD 08/22/16.   Nephrolithiasis    hx   NICM (nonischemic cardiomyopathy) (HCC)    Orthostatic hypotension    Osteoarthritis    PAF (paroxysmal atrial fibrillation) ([place])    a. diagnosed 07/2016 s/p DCCV 08/01/16 with recurrence, managed with rate control for now.    Pneumonia 2013   PVC's (premature ventricular contractions)    Shingles since Nov 18, 2013   on right arm from shoulder to wrist   Status post dilation of esophageal narrowing    Stroke [facility]) 2001   stroke affected left leg   Syncope    a. 08/2016 felt due to orthostatic hypotension.   TIA (transient  ischemic attack)  last 2001   anticoagulation therapy on coumadin    Past Surgical History: Past Surgical History:  Procedure Laterality Date   ANTERIOR LATERAL LUMBAR FUSION 4 LEVELS Right 07/03/2015   Procedure: Right Lumbar one-two, Lumbar two-three, Lumbar three-four, Lumbar four-five Anterior lateral lumbar interbody fusion;  Surgeon: Erline Levine, MD;  Location: [place] NEURO ORS;  Service: Neurosurgery;  Laterality: Right;  Right L1-2 L2-3 L3-4 L4-5 Anterior lateral lumbar interbody fusion   BALLOON DILATION N/A 03/03/2014   Procedure: BALLOON DILATION;  Surgeon: Inda Castle, MD;  Location: [facility] ENDOSCOPY;  Service: Endoscopy;  Laterality: N/A;   BLADDER SUSPENSION     BREAST BIOPSY Right 07/03/2006   BUBBLE STUDY  12/28/2021   Procedure: BUBBLE STUDY;  Surgeon: Berniece Salines, DO;  Location: [place];  Service: Cardiovascular;;   CARDIAC CATHETERIZATION N/A 08/22/2016   Procedure: Left Heart Cath and Coronary Angiography;  Surgeon: Peter M Martinique, MD;  Location: [place] CV LAB;  Service: Cardiovascular;  Laterality: N/A;   CARDIOVERSION N/A 08/01/2016   Procedure: CARDIOVERSION;  Surgeon: Jerline Pain, MD;  Location: [place];  Service: Cardiovascular;  Laterality: N/A;   CARDIOVERSION N/A 10/11/2016   Procedure: CARDIOVERSION;  Surgeon: Sueanne Margarita, MD;  Location: [place] ENDOSCOPY;  Service: Cardiovascular;  Laterality: N/A;   COLONOSCOPY     CYSTOSCOPY WITH RETROGRADE PYELOGRAM, URETEROSCOPY AND STENT PLACEMENT Left 11/04/2021   Procedure: CYSTOSCOPY WITH RETROGRADE PYELOGRAM, URETEROSCOPY  HOLMIUM LASER AND STENT PLACEMENT;  Surgeon: Ardis Hughs, MD;  Location: [facility];  Service: Urology;  Laterality: Left;   ESOPHAGOGASTRODUODENOSCOPY N/A 03/03/2014   Procedure: ESOPHAGOGASTRODUODENOSCOPY (EGD);  Surgeon: Inda Castle, MD;  Location: [facility];  Service: Endoscopy;  Laterality: N/A;   EXCISION OF BASAL CELL CA  2011   FINGER SURGERY     GLUTEUS MINIMUS REPAIR Right 03/15/2016    Procedure: RIGHT HIP GLUTEUS MEDIUS TENDON REPAIR;  Surgeon: Paralee Cancel, MD;  [facility]: [facility];  Service: Orthopedics;  Laterality: Right;   ileostomy reversal     LUMBAR PERCUTANEOUS PEDICLE SCREW 4 LEVEL Right 07/03/2015   Procedure: Lumbar one-Sacral one Bilateral percutaneous pedicle screws;  Surgeon: Erline Levine, MD;  Location: [place] NEURO ORS;  Service: Neurosurgery;  Laterality: Right;  L1-S1 Bilateral percutaneous pedicle screws   RECTOCELE REPAIR  2008   A&P REPAIR with vaginal repair-DR. MCDIARMID   TEE WITHOUT CARDIOVERSION N/A 12/28/2021   Procedure: TRANSESOPHAGEAL ECHOCARDIOGRAM (TEE);  Surgeon: Berniece Salines, DO;  Location: [place];  Service: Cardiovascular;  Laterality: N/A;   THUMB SURGERY Right 10-15 yrs ago   [place]   NO BSO-DR. MABRY   VARICOSE VEIN SURGERY      Family History: Family History  Problem Relation Age of Onset   COPD Mother    Breast cancer Mother        mid 51's   Uterine cancer Mother    Emphysema Mother    Breast cancer Maternal Aunt        x 3 aunts, post menopausal   Breast cancer Maternal Aunt    Breast cancer Maternal Aunt    Colon cancer Neg Hx    Esophageal cancer Neg Hx    Rectal cancer Neg Hx    Stomach cancer Neg Hx    Pancreatic cancer Neg Hx     Social History: Social History   Socioeconomic History   Marital status: Married    Spouse name: Shanon Brow  Number of children: 2   Years of education: Not on file   Highest education level: Not on file  Occupational History   Occupation: retired    Fish farm manager: RETIRED  Tobacco Use   Smoking status: Former    Packs/day: 3.00    Years: 42.00    Pack years: 126.00    Types: Cigarettes    Start date: 08/04/1954    Quit date: 12/06/1995    Years since quitting: 26.1   Smokeless tobacco: Never  Vaping Use   Vaping Use: Never used  Substance and Sexual Activity   Alcohol use: Never   Drug use: Never   Sexual activity:  Not Currently    Birth control/protection: Surgical    Comment: 1st intercourse 81 yo-Fewer than 5 partners  Other Topics Concern   Not on file  Social History Narrative   Retired- Investment banker, operational.       Financial controller Pulmonary:   Originally from [place]. Has always lived in [place]. She has traveled to [place], [place], [place], [place], [place], [place]. Has been on multiple cruises. 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 She did use a face mask consistently to avoid chemical fume exposure. She may have only had an inhaled exposure a couple of times. She currently has a dog. Remote exposure to a parakeet. No mold or hot tub exposure. No asbestos exposure.    Social Determinants of Health   Financial Resource Strain: Low Risk    Difficulty of Paying Living Expenses: Not hard at all  Food Insecurity: No Food Insecurity   Worried About Charity fundraiser in the Last Year: Never true   [place] in the Last Year: Never true  Transportation Needs: No Transportation Needs   Lack of Transportation (Medical): No   Lack of Transportation (Non-Medical): No  Physical Activity: Sufficiently Active   Days of Exercise per Week: 5 days   Minutes of Exercise per Session: 30 min  Stress: No Stress Concern Present   Feeling of Stress : Not at all  Social Connections: Socially Integrated   Frequency of Communication with Friends and Family: More than three times a week   Frequency of Social Gatherings with Friends and Family: More than three times a week   Attends Religious Services: More than 4 times per year   Active Member of [employer] or Organizations: Yes   Attends Archivist Meetings: More than 4 times per year   Marital Status: Married    Allergies:  Allergies  Allergen Reactions   Clarithromycin Other (See Comments)    REACTION: possible rash but could have been legionarres dz with rash   Lipitor  [Atorvastatin] Other (See Comments)    MUSCLE SPASMS   Simvastatin Other (See Comments)    Muscle and joint pain   Cardizem [Diltiazem Hcl] Hives   Contrast Media [Iodinated Contrast Media]     FYI - during [facility] at [facility] 08/[facility] patient developed hives, question related to cardizem or contrast dye - not clear   Diltiazem     Other reaction(s): rash   Statins     Other reaction(s): leg cramps   Rosuvastatin Calcium Other (See Comments)    fagitue and joint pain, "NO STATINS"    Objective:    Vital Signs:   Temp:  [98 F (36.7 C)-98.1 F (36.7 C)] 98 F (36.7 C) (02/01 1352) Pulse Rate:  [32-124] 105 (02/01 1200) Resp:  [  15-26] 17 (02/01 1200) BP: (78-119)/(52-93) 101/70 (02/01 1200) SpO2:  [94 %-100 %] 96 % (02/01 1200) Weight:  [52.2 kg] 52.2 kg (02/01 1352)    Weight change: Filed Weights   01/05/22 1352  Weight: 52.2 kg    Intake/Output:   Intake/Output Summary (Last 24 hours) at 01/05/2022 1449 Last data filed at 01/05/2022 1400 Gross per 24 hour  Intake 240 ml  Output --  Net 240 ml      Physical Exam    General:  Sitting on the side of the bed.  HEENT: normal Neck: supple. JVP 7-8 . Carotids 2+ bilat; no bruits. No lymphadenopathy or thyromegaly appreciated. Cor: PMI nondisplaced. Tachy/Irregular rate & rhythm. No rubs, gallops or + MR Lungs: No wheeze on 4 liters [place]  Abdomen: soft, nontender, nondistended. No hepatosplenomegaly. No bruits or masses. Good bowel sounds. Extremities: no cyanosis, clubbing, rash, edema Neuro: alert & orientedx3, cranial nerves grossly intact. moves all 4 extremities w/o difficulty. Affect flat   Telemetry   A fib RVR 130-140s  EKG    On admit A fib 108 bpm   Labs   Basic Metabolic Panel: Recent Labs  Lab 12/09/2021 1913 01/04/22 0316 01/05/22 1011  NA 135 138 138  K 4.1 4.1 3.5  CL 98 101 97*  CO2 27 27 32  GLUCOSE 94 83 127*  BUN 30* 29* 25*  CREATININE 1.73* 1.58* 1.53*  CALCIUM 8.8* 8.9  9.3  MG  --  1.7  --   PHOS  --  3.6  --     Liver Function Tests: Recent Labs  Lab 12/31/2021 1913 01/04/22 0316  AST 19 19  ALT 18 19  ALKPHOS 88 92  BILITOT 0.4 0.6  PROT 5.4* 5.8*  ALBUMIN 3.2* 3.5   No results for input(s): LIPASE, AMYLASE in the last 168 hours. No results for input(s): AMMONIA in the last 168 hours.  CBC: Recent Labs  Lab 12/17/2021 1913 01/04/22 0316 01/05/22 1011  WBC 5.2 6.3 7.2  NEUTROABS 3.4 4.1 4.7  HGB 10.8* 11.1* 12.3  HCT 34.8* 37.0 38.9  MCV 85.1 85.6 84.4  PLT 154 174 205    Cardiac Enzymes: No results for input(s): CKTOTAL, CKMB, CKMBINDEX, TROPONINI in the last 168 hours.  BNP: BNP (last 3 results) Recent Labs    12/31/2021 1737  BNP 2,812.3*    ProBNP (last 3 results) No results for input(s): PROBNP in the last 8760 hours.   CBG: No results for input(s): GLUCAP in the last 168 hours.  Coagulation Studies: No results for input(s): LABPROT, INR in the last 72 hours.   Imaging   No results found.   Medications:     Current Medications:  buPROPion  300 mg Oral Daily   colestipol  1 g Oral BID   ezetimibe  10 mg Oral Daily   ferrous sulfate  325 mg Oral Q breakfast   fluticasone  1 spray Each Nare BID   furosemide  20 mg Intravenous BID   hydroxychloroquine  400 mg Oral QHS   levothyroxine  25 mcg Oral QAC breakfast   loratadine  10 mg Oral Daily   metoprolol tartrate  12.5 mg Oral BID   montelukast  10 mg Oral Daily   pantoprazole  40 mg Oral QHS    Infusions:     Patient Profile   [name] is a 81 year old with a history of PAF, moderate AS, severe MR, COPD, quit smoking 25 years ago, PAD, HTN,  hyperlipidemia, CVA, NICM, tobacco abuse, ischemic bowel with resection in 2017,11/04/22 left ureteral calculus S/P utreteroscopy and stent,  and chronic HFrEF.    Admitted A/C HFrEF and A fib RVR.   Assessment/Plan   A/C HFrEF Had LHC in 2017 minimal disease.  -Echo this admit EF down to 25% from previous  Echo EF 40-45% in 2018 .  BNP on admit > 2800.  She has been diuresed but I am concerned with soft BP and ongoing A fib RVR that she may have low output /cardiogenic shock.  - Will place order for PICC but may need to place swan for additional hemodynamics + /- IABP. Needs CO-OX and CVP monitoring.  - Check lactic acid now.  - She has been diuresing with IV lasix. Supp K  . Volume status looks ok. Stop IV lasix.  - No room for GDMT with low bp.  -Stop metoprolol tartrate ->reduced EF and suspected low output.  -Follow closely.   2. Severe MR  -12/28/21 TEE with severe MR -Structural heart has consulted   3. PAF--> In A fib RVR In Afib RVR on admit but today rate is 130s.   - TSH 2.2 - Stop metoprolol tartrate - Start amio drip for rate control - Last dose of eliquis 12/20/2021 (morning)  - Start heparin drip now. Discussed with pharmacy.  - Replace K/Mag   4. AKI  -Recently had diuretics increased on 1/5. Creatinine on that time was 1.2.  -On admit creatinine was 1.7, today 1.5  - Check BMET in am.   5. A/C Respiratory Failure -H/O COPD with triple inhalers and recently placed on oxygen 12/17/21.  - Add nebs.  -No wheeze on exam  - Continue oxygen   6. Anemia  Hgb 12. Iron Sats 7%.    Discussed with pharmacy heparin + amio drip. May need to move to ICU should she worsen.     Length of Stay: 2  Darrick Grinder, NP  01/05/2022, 2:49 PM  Advanced Heart Failure Team Pager [phone] (M-F; 7a - 5p)  Please contact [facility] for night-coverage after hours (4p -7a ) and weekends on [email or web address]  Patient seen and examined with the above-signed Advanced Practice Provider and/or Housestaff. I personally reviewed laboratory data, imaging studies and relevant notes. I independently examined the patient and formulated the important aspects of the plan. I have edited the note to reflect any of my changes or salient points. I have personally discussed the plan with the patient and/or  family.  81 y/o woman with PAF and mild NICM.  LHC 2017 mild non-obstructive CAD Echo 2018 EF 45-50%  Seen in EP clinic 11/22. Feeling well. No ECG done. Echo ordered for murmur.  Echo done 12/122 EF 25-30% severe MR -> was in NSR during this study  Several week h/o worsening functional capacity.   Admitted with decompensated HF and AF with RVR. Symptomatically improved with diuresis.   TEE 12/28/21 EF 25-30% severe MR  Feels very weak. Asking if she is going to die.Remains in AF 120s  ECG with LBBB 130-168ms  General:  Elderly weak appearing. No resp difficulty HEENT: normal Neck: supple. JVP flat Carotids 2+ bilat; no bruits. No lymphadenopathy or thryomegaly appreciated. Cor: PMI nondisplaced. Irr tachy + 2/6 MR Lungs: clear Abdomen: soft, nontender, nondistended. No hepatosplenomegaly. No bruits or masses. Good bowel sounds. Extremities: no cyanosis, clubbing, rash, edema Neuro: alert & orientedx3, cranial nerves grossly intact. moves all 4 extremities w/o difficulty. Affect pleasant  She is tenuous  but not overtly shocky. Volume status improved with diuresis.. Would stop lasix.   Etiology of worsening cardiomyopathy remains unclear - based on her echo in 12/22 it seems as this preceded development of recurrent AF so doubt this is strictly tachy-induced CM. May have component of LBBB myopathy but QRS is not that broad and I suspect CRT might not be sufficient to fix her at this point.   Will focus on restoration of NSR then would consider R/L cath followed by aggressive medical therapy +/- CRT if improving. If no improvement would strongly consider MitraClip as LV is not severely dilated and she may have good response.   Plan will be for PICC line to night to follow co-ox and CVP more closely. Continue amio and IV heparin. Can start milrinone as needed.   Glori Bickers, MD  7:02 PM

## 2022-01-05 NOTE — ED Notes (Signed)
Provider made aware patient is requesting something stronger to help her sleep.

## 2022-01-05 NOTE — Progress Notes (Signed)
Progress Note  Patient Name: [name] Date of Encounter: 01/05/2022  [facility] Cardiologist: Will Meredith Leeds, MD    Subjective    81 yo with hx of severe MR, severe left ventricular dysfunction, severe COPD.  She has a history of CVA, atrial fibrillation, CKD, hypertension, hyperlipidemia.  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  She said progressive respiratory failure over the past several weeks. Echocardiogram in May, 2018 revealed mildly depressed left ventricular systolic function with an EF of 45 to 50%.  She has grade 1 diastolic dysfunction.  She had moderate pulmonary hypertension with a PA pressure of 40.  She was seen by Tommye Standard, PA repeat echocardiogram in November, 2022 reveals severely depressed left ventricular systolic function with an EF of 25 to 30%.  She had moderate to severe mitral regurgitation.  There was mild aortic stenosis.  A transesophageal echo was ordered and performed last week.  She was found to have severe mitral regurgitation with reversal of flow in the pulmonary veins.  She presented presented to the hospital on January 30 with several days of progressive shortness of breath, orthopnea and some bilateral edema.  She has had a nonproductive cough as well as some wheezing.  I saw her this morning.  She still has significant orthopnea.  She is sitting up leaning over in bed.  She still very short of breath.   Inpatient Medications    Scheduled Meds:  buPROPion  300 mg Oral Daily   colestipol  1 g Oral BID   ezetimibe  10 mg Oral Daily   ferrous sulfate  325 mg Oral Q breakfast   fluticasone  1 spray Each Nare BID   furosemide  20 mg Intravenous BID   hydroxychloroquine  400 mg Oral QHS   ipratropium-albuterol  3 mL Nebulization Once   levothyroxine  25 mcg Oral QAC breakfast   loratadine  10 mg Oral Daily   methylPREDNISolone acetate  80 mg Intramuscular Once   metoprolol tartrate  12.5 mg  Oral BID   montelukast  10 mg Oral Daily   pantoprazole  40 mg Oral QHS   Continuous Infusions:  PRN Meds: acetaminophen **OR** acetaminophen, albuterol, melatonin, traZODone   Vital Signs    Vitals:   01/05/22 0500 01/05/22 0600 01/05/22 0700 01/05/22 0800  BP: 90/76 (!) 85/62 (!) 78/52 102/86  Pulse: 95 (!) 32 (!) 112   Resp: 15 19 20  (!) 26  Temp:      TempSrc:      SpO2: 96% 99% 98% 94%   No intake or output data in the 24 hours ending 01/05/22 1059 Last 3 Weights 12/31/2021 12/28/2021 12/17/2021  Weight (lbs) 123 lb 3.2 oz 126 lb 126 lb  Weight (kg) 55.883 kg 57.153 kg 57.153 kg      Telemetry    Afib with HR 120s- Personally Reviewed  ECG     - Personally Reviewed  Physical Exam   GEN: Chronically ill-appearing female, mild to moderate respiratory distress. Neck: No JVD Cardiac: Irregularly irregular.  Heart rates in the 120s.  Soft systolic murmur. Respiratory: Bilateral wheezes GI: Soft, nontender, non-distended  MS: No edema; No deformity. Neuro:  Nonfocal  Psych: Normal affect   Labs    High Sensitivity Troponin:   Recent Labs  Lab 12/25/2021 1913 12/15/2021 2113  TROPONINIHS 13 13     Chemistry Recent Labs  Lab 12/30/2021 1913 01/04/22 0316 01/05/22 1011  NA 135 138 138  K 4.1 4.1 3.5  CL 98 101 97*  CO2 27 27 32  GLUCOSE 94 83 127*  BUN 30* 29* 25*  CREATININE 1.73* 1.58* 1.53*  CALCIUM 8.8* 8.9 9.3  MG  --  1.7  --   PROT 5.4* 5.8*  --   ALBUMIN 3.2* 3.5  --   AST 19 19  --   ALT 18 19  --   ALKPHOS 88 92  --   BILITOT 0.4 0.6  --   GFRNONAA 29* 32* 34*  ANIONGAP 10 10 9     Lipids No results for input(s): CHOL, TRIG, HDL, LABVLDL, LDLCALC, CHOLHDL in the last 168 hours.  Hematology Recent Labs  Lab 12/31/2021 1913 01/04/22 0316 01/05/22 1011  WBC 5.2 6.3 7.2  RBC 4.09 4.32 4.61  HGB 10.8* 11.1* 12.3  HCT 34.8* 37.0 38.9  MCV 85.1 85.6 84.4  MCH 26.4 25.7* 26.7  MCHC 31.0 30.0 31.6  RDW 16.2* 16.3* 16.3*  PLT 154 174 205    Thyroid  Recent Labs  Lab 12/17/2021 1817  TSH 2.271    BNP Recent Labs  Lab 12/05/2021 1737  BNP 2,812.3*    DDimer No results for input(s): DDIMER in the last 168 hours.   Radiology    DG Chest Portable 1 View  Result Date: 12/28/2021 CLINICAL DATA:  Short of breath, not feeling well for 2 weeks EXAM: PORTABLE CHEST 1 VIEW COMPARISON:  12/09/2021 FINDINGS: Single frontal view of the chest demonstrates a stable cardiac silhouette. Persistent bilateral pleural effusions, left greater than right, minimally increased since prior study. Increased consolidation at the left lung base may reflect airspace disease or atelectasis. Diffuse interstitial prominence is again noted, with slight increase in interstitial and ground-glass opacities at the lung bases consistent with worsening of volume status. No pneumothorax. No acute bony abnormality. IMPRESSION: 1. Worsening volume status, with developing bibasilar edema and slight enlargement of bilateral pleural effusions. Electronically Signed   By: Randa Ngo M.D.   On: 12/08/2021 17:59    Cardiac Studies     Patient Profile     81 y.o. female   Assessment & Plan    Mitral regurgitation: The patient is now found to have severe mitral regurgitation.  This is associated with a markedly reduced LVEF of 25 to 30%. She has evidence of reversal of flow in the pulmonary veins.  Have sent a consult to our MitraClip team for them to evaluate her for possible MitraClip.  She has severe cardiac dysfunction related to her markedly depressed LVEF as well as her severe mitral regurgitation.  Her condition is also complicated by her fairly severe COPD.  2.  Paroxysmal atrial fibrillation with rapid ventricular rate.  She remains in A. fib with RVR.  I suspect that most of this is because of her decompensated congestive heart failure and severe mitral regurgitation. Continue Eliquis.  Continue low-dose metoprolol for rate control although I suspect a  lot of her rapid ventricular response is because of her decompensated heart failure and mitral regurgitation.       For questions or updates, please contact [place] Please consult [email or web address] for contact info under        Signed, Mertie Moores, MD  01/05/2022, 10:59 AM

## 2022-01-05 NOTE — Progress Notes (Signed)
Peripherally Inserted Central Catheter Placement  The IV Nurse has discussed with the patient and/or persons authorized to consent for the patient, the purpose of this procedure and the potential benefits and risks involved with this procedure.  The benefits include less needle sticks, lab draws from the catheter, and the patient may be discharged home with the catheter. Risks include, but not limited to, infection, bleeding, blood clot (thrombus formation), and puncture of an artery; nerve damage and irregular heartbeat and possibility to perform a PICC exchange if needed/ordered by physician.  Alternatives to this procedure were also discussed.  Bard Power PICC patient education guide, fact sheet on infection prevention and patient information card has been provided to patient /or left at bedside.    PICC Placement Documentation  PICC Triple Lumen 01/05/22 Right Brachial 36 cm 0 cm (Active)  Indication for Insertion or Continuance of Line Vasoactive infusions 01/05/22 1741  Exposed Catheter (cm) 0 cm 01/05/22 1741  Site Assessment Clean;Intact;Dry 01/05/22 1741  Lumen #1 Status Flushed;Saline locked;Blood return noted 01/05/22 1741  Lumen #2 Status Blood return noted;Flushed;Saline locked 01/05/22 1741  Lumen #3 Status Blood return noted;Saline locked;Flushed 01/05/22 1741  Dressing Type Transparent 01/05/22 1741  Dressing Status Clean;Dry;Intact 01/05/22 1741  Antimicrobial disc in place? Yes 01/05/22 1741  Dressing Intervention New dressing;Other (Comment) 01/05/22 1741  Dressing Change Due 01/12/22 01/05/22 1741       Christella Noa Albarece 01/05/2022, 5:43 PM

## 2022-01-05 NOTE — ED Notes (Signed)
MD made aware patient is requesting something for anxiety.  Patient reports she normally takes trazadone for sleep.  Charge RN is talking with patient and her husband at this time d/t husband's request.

## 2022-01-05 NOTE — Consult Note (Addendum)
Structural Heart Disease Consultation:   Patient ID: [name] MRN: [id number]; DOB: February 15, 1941  Admit date: 12/05/2021 Date of Consult: 01/05/2022  PCP:  Biagio Borg, MD   [facility] Providers Cardiologist:  Will Meredith Leeds, MD  Electrophysiologist:  Constance Haw, MD       History of Present Illness:   Ms. Brazzel is an 81 year old female with a history of probably valvular disease consisting of moderate aortic stenosis and severe mitral regurgitation, nonischemic cardiomyopathy with an ejection fraction of 25 to 30% remote tobacco abuse with COPD, paroxysmal atrial fibrillation on Eliquis, hypertension, hyperlipidemia, nonobstructive coronary artery disease, prior CVA, chronic kidney disease with a creatinine of around 1.5 was admitted with acute on chronic systolic heart failure.  The patient has experienced a symptomatic decline over the last 3 weeks.  Prior to this she was feeling relatively well.  She does have a reduced functional capacity at baseline.  She does some small activities around her house and does write it cart at the grocery store with her husband but is largely sedentary.  About 3 weeks ago she tells me she had a developed bronchitis.  She tells me she never really recovered following this.  Per chart review earlier this month chest x-ray demonstrated pulmonary edema.  Due to worsening shortness of breath and a cough she was treated for COPD exacerbation with prednisone and her Lasix was increased to 40 mg.  VQ scan was indeterminate for pulmonary embolism.  She underwent an echocardiogram and then a TEE which demonstrated severe mitral regurgitation and an ejection fraction approximately 25 to 30%.  The left ventricular dimensions were not enlarged.  She had evidence of severe mitral regurgitation with pulmonary vein flow reversal.  She also has moderate aortic stenosis.  She presented to [facility] 2 days ago with worsening shortness of breath.  She  is aggressively diuresed and is feeling much better.  In terms of other symptoms, she has had no significant chest pain.  She denies any palpitations, peripheral edema, severe bleeding or bruising, presyncope, syncope, or signs or symptoms of stroke.   Past Medical History:  Diagnosis Date   Allergic rhinitis    Anxiety    Atrial septal aneurysm    Basal cell carcinoma    Bradycardia    Cataract    bil cataracts removed   Chronic combined systolic and diastolic CHF (congestive heart failure) (HCC)    CKD (chronic kidney disease), stage III (HCC)    Clostridium difficile infection    COPD (chronic obstructive pulmonary disease) ([place])    Depression 11/29/2013   DI (detrusor instability)    Esophageal stricture    Hemorrhoids    Hiatal hernia    History of kidney stones    HLD (hyperlipidemia)    HTN (hypertension)    Ischemic colon ([place])    a. 08/2016 - adm to  [facility] with a ruptured colon s/p right hemicolectomy with ileostomy formation for ischemic and necrotic colon.    Lung nodule    a. followed by pulmonology   Mild CAD    a. minor nonobstructive CAD 08/22/16.   Nephrolithiasis    hx   NICM (nonischemic cardiomyopathy) (HCC)    Orthostatic hypotension    Osteoarthritis    PAF (paroxysmal atrial fibrillation) ([place])    a. diagnosed 07/2016 s/p DCCV 08/01/16 with recurrence, managed with rate control for now.    Pneumonia 2013   PVC's (premature ventricular contractions)    Shingles  since Nov 18, 2013   on right arm from shoulder to wrist   Status post dilation of esophageal narrowing    Stroke [facility]) 2001   stroke affected left leg   Syncope    a. 08/2016 felt due to orthostatic hypotension.   TIA (transient ischemic attack) last 2001   anticoagulation therapy on coumadin    Past Surgical History:  Procedure Laterality Date   ANTERIOR LATERAL LUMBAR FUSION 4 LEVELS Right 07/03/2015   Procedure: Right Lumbar one-two, Lumbar two-three, Lumbar three-four,  Lumbar four-five Anterior lateral lumbar interbody fusion;  Surgeon: Erline Levine, MD;  Location: [place] NEURO ORS;  Service: Neurosurgery;  Laterality: Right;  Right L1-2 L2-3 L3-4 L4-5 Anterior lateral lumbar interbody fusion   BALLOON DILATION N/A 03/03/2014   Procedure: BALLOON DILATION;  Surgeon: Inda Castle, MD;  Location: [facility] ENDOSCOPY;  Service: Endoscopy;  Laterality: N/A;   BLADDER SUSPENSION     BREAST BIOPSY Right 07/03/2006   BUBBLE STUDY  12/28/2021   Procedure: BUBBLE STUDY;  Surgeon: Berniece Salines, DO;  Location: [place];  Service: Cardiovascular;;   CARDIAC CATHETERIZATION N/A 08/22/2016   Procedure: Left Heart Cath and Coronary Angiography;  Surgeon: Peter M Martinique, MD;  Location: [place] CV LAB;  Service: Cardiovascular;  Laterality: N/A;   CARDIOVERSION N/A 08/01/2016   Procedure: CARDIOVERSION;  Surgeon: Jerline Pain, MD;  Location: [place];  Service: Cardiovascular;  Laterality: N/A;   CARDIOVERSION N/A 10/11/2016   Procedure: CARDIOVERSION;  Surgeon: Sueanne Margarita, MD;  Location: [place] ENDOSCOPY;  Service: Cardiovascular;  Laterality: N/A;   COLONOSCOPY     CYSTOSCOPY WITH RETROGRADE PYELOGRAM, URETEROSCOPY AND STENT PLACEMENT Left 11/04/2021   Procedure: CYSTOSCOPY WITH RETROGRADE PYELOGRAM, URETEROSCOPY  HOLMIUM LASER AND STENT PLACEMENT;  Surgeon: Ardis Hughs, MD;  [facility]: [facility];  Service: Urology;  Laterality: Left;   ESOPHAGOGASTRODUODENOSCOPY N/A 03/03/2014   Procedure: ESOPHAGOGASTRODUODENOSCOPY (EGD);  Surgeon: Inda Castle, MD;  Location: [facility];  Service: Endoscopy;  Laterality: N/A;   EXCISION OF BASAL CELL CA  2011   FINGER SURGERY     GLUTEUS MINIMUS REPAIR Right 03/15/2016   Procedure: RIGHT HIP GLUTEUS MEDIUS TENDON REPAIR;  Surgeon: Paralee Cancel, MD;  [facility]: [facility];  Service: Orthopedics;  Laterality: Right;   ileostomy reversal     LUMBAR PERCUTANEOUS PEDICLE SCREW 4 LEVEL Right 07/03/2015   Procedure: Lumbar one-Sacral  one Bilateral percutaneous pedicle screws;  Surgeon: Erline Levine, MD;  Location: [place] NEURO ORS;  Service: Neurosurgery;  Laterality: Right;  L1-S1 Bilateral percutaneous pedicle screws   RECTOCELE REPAIR  2008   A&P REPAIR with vaginal repair-DR. MCDIARMID   TEE WITHOUT CARDIOVERSION N/A 12/28/2021   Procedure: TRANSESOPHAGEAL ECHOCARDIOGRAM (TEE);  Surgeon: Berniece Salines, DO;  Location: [place];  Service: Cardiovascular;  Laterality: N/A;   THUMB SURGERY Right 10-15 yrs ago   [place]   NO BSO-DR. MABRY   VARICOSE VEIN SURGERY       Home Medications:  Prior to Admission medications   Medication Sig Start Date End Date Taking? Authorizing Provider  albuterol (VENTOLIN HFA) 108 (90 Base) MCG/ACT inhaler Inhale 1-2 puffs into the lungs every 6 (six) hours as needed for wheezing or shortness of breath. 12/08/20  Yes Parrett, Tammy S, NP  buPROPion (WELLBUTRIN XL) 300 MG 24 hr tablet Take 1 tablet (300 mg total) by mouth daily. 10/26/21  Yes Biagio Borg, MD  Calcium Carbonate-Vitamin D (CALCIUM +  D PO) Take 1 tablet by mouth 2 (two) times daily.   Yes [provider]  colestipol (COLESTID) 1 g tablet Take 1 g by mouth 2 (two) times daily.   Yes [provider]  ezetimibe (ZETIA) 10 MG tablet Take 1 tablet (10 mg total) by mouth daily. 10/26/21 01/24/22 Yes Biagio Borg, MD  ferrous sulfate 325 (65 FE) MG tablet Take 325 mg by mouth daily with breakfast.   Yes [provider]  fluticasone (FLONASE) 50 MCG/ACT nasal spray Place 1 spray into both nostrils 2 (two) times daily. 06/10/21  Yes Parrett, Tammy S, NP  Fluticasone-Umeclidin-Vilant (TRELEGY ELLIPTA) 100-62.5-25 MCG/INH AEPB Inhale 1 puff into the lungs daily. I inhalation once daily 12/08/20  Yes Parrett, Tammy S, NP  furosemide (LASIX) 20 MG tablet Take 2 tablets (40 mg total) by mouth daily. 12/17/21  Yes Cobb, Karie Schwalbe, NP  gabapentin (NEURONTIN) 300 MG  capsule 1 tab by mouth in AM, 1 po at midday, then 2 by mouth at bedtime for sleep and pain Patient taking differently: Take 300-600 mg by mouth See admin instructions. Take 300 mg by mouth in the morning and 600 mg at night 06/17/21  Yes Biagio Borg, MD  guaiFENesin (MUCINEX) 600 MG 12 hr tablet Take 1,200 mg by mouth daily.   Yes [provider]  hydroxychloroquine (PLAQUENIL) 200 MG tablet Take 400 mg by mouth at bedtime.   Yes [provider]  ipratropium (ATROVENT) 0.03 % nasal spray Place 2 sprays into both nostrils 3 (three) times daily as needed for rhinitis. 06/10/21  Yes Parrett, Tammy S, NP  ipratropium-albuterol (DUONEB) 0.5-2.5 (3) MG/3ML SOLN Take 3 mLs by nebulization every 6 (six) hours as needed (shortness of breath or wheezing). 12/09/21  Yes Cobb, Karie Schwalbe, NP  levothyroxine (SYNTHROID) 25 MCG tablet Take 1 tablet (25 mcg total) by mouth daily before breakfast. Annual appt due in Nov must see provider for future refills 12/24/20  Yes Biagio Borg, MD  Lidocaine HCl 2 % CREA Apply 1 application topically daily as needed (for shingles flares).   Yes [provider]  loratadine (CLARITIN) 10 MG tablet Take 10 mg by mouth daily.   Yes [provider]  Melatonin 10 MG TABS Take 10 mg by mouth at bedtime.   Yes [provider]  metoprolol succinate (TOPROL-XL) 100 MG 24 hr tablet Take 1 tablet (100 mg total) by mouth daily. Take with or immediately following a meal. Patient taking differently: Take 100 mg by mouth every evening. Take with or immediately following a meal. 09/24/21  Yes Camnitz, Will Hassell Done, MD  mirabegron ER (MYRBETRIQ) 50 MG TB24 tablet Take 50 mg by mouth at bedtime.   Yes [provider]  montelukast (SINGULAIR) 10 MG tablet Take 1 tablet (10 mg total) by mouth daily. Annual appt due in Nov must see provider for future refills 11/05/21  Yes Biagio Borg, MD  Multiple Vitamin (MULTIVITAMIN) tablet Take 2 tablets by  mouth daily.   Yes [provider]  omeprazole (PRILOSEC) 40 MG capsule Take 1 capsule (40 mg total) by mouth daily. Patient taking differently: Take 40 mg by mouth every evening. 05/28/21  Yes Biagio Borg, MD  Probiotic Product (PROBIOTIC DAILY PO) Take 1 capsule by mouth in the morning and at bedtime.   Yes [provider]  traZODone (DESYREL) 50 MG tablet Take 1 tablet (50 mg total) by mouth at bedtime and may repeat dose one time if  needed. Patient taking differently: Take 100 mg by mouth at bedtime. 09/24/21  Yes Biagio Borg, MD  benzonatate (TESSALON) 200 MG capsule Take 1 capsule (200 mg total) by mouth 3 (three) times daily as needed for cough. Patient not taking: Reported on 01/04/2022 08/10/21 08/10/22  Parrett, Fonnie Mu, NP  Biotin 5 MG CAPS Take 5 mg by mouth daily.    [provider]  cyclobenzaprine (FLEXERIL) 5 MG tablet Take 1 tablet (5 mg total) by mouth 3 (three) times daily as needed for muscle spasms. Patient not taking: Reported on 01/04/2022 10/17/18   Biagio Borg, MD  furosemide (LASIX) 40 MG tablet Take 1 tablet (40 mg total) by mouth daily. Patient not taking: Reported on 12/23/2021 12/13/21   [place] Bibles, NP  promethazine-dextromethorphan (PROMETHAZINE-DM) 6.25-15 MG/5ML syrup Take 2.5 mLs by mouth 4 (four) times daily as needed for cough. Patient not taking: Reported on 01/04/2022 12/09/21   [place] Bibles, NP    Inpatient Medications: Scheduled Meds:  buPROPion  300 mg Oral Daily   colestipol  1 g Oral BID   ezetimibe  10 mg Oral Daily   ferrous sulfate  325 mg Oral Q breakfast   fluticasone  1 spray Each Nare BID   furosemide  20 mg Intravenous BID   hydroxychloroquine  400 mg Oral QHS   ipratropium-albuterol  3 mL Nebulization Once   levothyroxine  25 mcg Oral QAC breakfast   loratadine  10 mg Oral Daily   methylPREDNISolone acetate  80 mg Intramuscular Once   metoprolol tartrate  12.5 mg Oral BID   montelukast  10 mg Oral Daily    pantoprazole  40 mg Oral QHS   Continuous Infusions:  PRN Meds: acetaminophen **OR** acetaminophen, albuterol, melatonin, traZODone  Allergies:    Allergies  Allergen Reactions   Clarithromycin Other (See Comments)    REACTION: possible rash but could have been legionarres dz with rash   Lipitor [Atorvastatin] Other (See Comments)    MUSCLE SPASMS   Simvastatin Other (See Comments)    Muscle and joint pain   Cardizem [Diltiazem Hcl] Hives   Contrast Media [Iodinated Contrast Media]     FYI - during admission [facility] 08/2016 patient developed hives, question related to cardizem or contrast dye - not clear   Diltiazem     Other reaction(s): rash   Statins     Other reaction(s): leg cramps   Rosuvastatin Calcium Other (See Comments)    fagitue and joint pain, "NO STATINS"    Social History:   Social History   Socioeconomic History   Marital status: Married    Spouse name: Shanon Brow   Number of children: 2   Years of education: Not on file   Highest education level: Not on file  Occupational History   Occupation: retired    Fish farm manager: RETIRED  Tobacco Use   Smoking status: Former    Packs/day: 3.00    Years: 42.00    Pack years: 126.00    Types: Cigarettes    Start date: 08/04/1954    Quit date: 12/06/1995    Years since quitting: 26.1   Smokeless tobacco: Never  Vaping Use   Vaping Use: Never used  Substance and Sexual Activity   Alcohol use: Never   Drug use: Never   Sexual activity: Not Currently    Birth control/protection: Surgical    Comment: 1st intercourse 81 yo-Fewer than 5 partners  Other Topics Concern   Not on file  Social History Narrative   Retired- Dentist Pulmonary:   Originally from [place]. Has always lived in [place]. She has traveled to [place], [place], [place], [place], [place], [place]. Has been on multiple cruises. 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 She did use a face mask consistently to avoid chemical fume exposure. She may have only had an inhaled exposure a couple of times. She currently has a dog. Remote exposure to a parakeet. No mold or hot tub exposure. No asbestos exposure.    Social Determinants of Health   Financial Resource Strain: Low Risk    Difficulty of Paying Living Expenses: Not hard at all  Food Insecurity: No Food Insecurity   Worried About Charity fundraiser in the Last Year: Never true   [place] in the Last Year: Never true  Transportation Needs: No Transportation Needs   Lack of Transportation (Medical): No   Lack of Transportation (Non-Medical): No  Physical Activity: Sufficiently Active   Days of Exercise per Week: 5 days   Minutes of Exercise per Session: 30 min  Stress: No Stress Concern Present   Feeling of Stress : Not at all  Social Connections: Socially Integrated   Frequency of Communication with Friends and Family: More than three times a week   Frequency of Social Gatherings with Friends and Family: More than three times a week   Attends Religious Services: More than 4 times per year   Active Member of [employer] or Organizations: Yes   Attends Music therapist: More than 4 times per year   Marital Status: Married  Human resources officer Violence: Not At Risk   Fear of Current or Ex-Partner: No   Emotionally Abused: No   Physically Abused: No   Sexually Abused: No    Family History:    Family History  Problem Relation Age of Onset   COPD Mother    Breast cancer Mother        mid 76's   Uterine cancer Mother    Emphysema Mother    Breast cancer Maternal Aunt        x 3 aunts, post menopausal   Breast cancer Maternal Aunt    Breast cancer Maternal Aunt    Colon cancer Neg Hx    Esophageal cancer Neg Hx    Rectal cancer Neg Hx    Stomach cancer Neg Hx    Pancreatic cancer Neg Hx      ROS:  Please see the history of present illness.    All other ROS reviewed and negative.     Physical Exam/Data:   Vitals:   01/05/22 0600 01/05/22 0700 01/05/22 0800 01/05/22 1200  BP: (!) 85/62 (!) 78/52 102/86 101/70  Pulse: (!) 32 (!) 112  (!) 105  Resp: 19 20 (!) 26 17  Temp:    98.1 F (36.7 C)  TempSrc:    Oral  SpO2: 99% 98% 94% 96%   No intake or output data in the 24 hours ending 01/05/22 1238 Last 3 Weights 12/31/2021 12/28/2021 12/17/2021  Weight (lbs) 123 lb 3.2 oz 126 lb 126 lb  Weight (kg) 55.883 kg 57.153 kg 57.153 kg     There is no height or weight on file to calculate BMI.  General: Elderly, frail, and cachectic in no significant distress HEENT: normal Neck: Elevated JVD Vascular: No carotid bruits; Distal pulses 2+  bilaterally Cardiac:  normal S1, S2; irregular and tachycardic with 3 out of 6 holosystolic murmur best heard at the axilla Lungs: Mildly decreased breath sounds at bases Abd: soft, nontender, no hepatomegaly  Ext: no edema, warm and well-perfused Musculoskeletal:  No deformities, BUE and BLE strength normal and equal Skin: warm and dry  Neuro:  CNs 2-12 intact, no focal abnormalities noted Psych:  Normal affect   EKG:  The EKG was personally reviewed and demonstrates: Atrial fibrillation with ventricular rate in the 100s Telemetry:  Telemetry was personally reviewed and demonstrates: Atrial fibrillation with rates around 100  Relevant CV Studies:   TEE 1/23 1. Left ventricular ejection fraction, by estimation, is 25 to 30%. The  left ventricle has severely decreased function. The left ventricle  demonstrates global hypokinesis.   2. Right ventricular systolic function is normal. The right ventricular  size is normal.   3. No left atrial/left atrial appendage thrombus was detected.   4. The mitral valve is grossly normal but does not coapt completely.  Severe mitral valve regurgitation. 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 Systolic flow reversal  appreciated in  left upper pulmonary vein. Mild mitral  stenosis. The mean mitral valve gradient is 5.0 mmHg.   5. Tricuspid valve regurgitation is mild to moderate.   6. The aortic valve is tricuspid. Aortic valve regurgitation is not  visualized. Mild to moderate aortic valve stenosis. Aortic valve area, by  VTI measures 1.28 cm. Aortic valve mean gradient measures 7.5 mmHg.  Aortic valve Vmax measures 2.05 m/s.   7. Shunting seen with color flow left to right. Agitated saline contrast  bubble study was negative, with no evidence of any interatrial shunt.  There is a patent foramen ovale with predominantly left to right shunting  across the atrial septum.   Cath 2017 Prox LAD to Mid LAD lesion, 15 %stenosed. Ost Cx to Prox Cx lesion, 15 %stenosed. Prox RCA to Mid RCA lesion, 20 %stenosed. There is mild left ventricular systolic dysfunction. LV end diastolic pressure is normal. The left ventricular ejection fraction is 45-50% by visual estimate.   1. Minor nonobstructive CAD 2. Mild LV dysfunction with distal inferior HK 3. Measurement of LV pressures limited by frequent PVCs but EDP appears normal.   Laboratory Data:  High Sensitivity Troponin:   Recent Labs  Lab 12/20/2021 1913 12/27/2021 2113  TROPONINIHS 13 13     Chemistry Recent Labs  Lab 12/27/2021 1913 01/04/22 0316 01/05/22 1011  NA 135 138 138  K 4.1 4.1 3.5  CL 98 101 97*  CO2 27 27 32  GLUCOSE 94 83 127*  BUN 30* 29* 25*  CREATININE 1.73* 1.58* 1.53*  CALCIUM 8.8* 8.9 9.3  MG  --  1.7  --   GFRNONAA 29* 32* 34*  ANIONGAP 10 10 9     Recent Labs  Lab 12/29/2021 1913 01/04/22 0316  PROT 5.4* 5.8*  ALBUMIN 3.2* 3.5  AST 19 19  ALT 18 19  ALKPHOS 88 92  BILITOT 0.4 0.6   Lipids No results for input(s): CHOL, TRIG, HDL, LABVLDL, LDLCALC, CHOLHDL in the last 168 hours.  Hematology Recent Labs  Lab 12/31/2021 1913 01/04/22 0316 01/05/22 1011  WBC 5.2 6.3 7.2  RBC 4.09 4.32 4.61  HGB 10.8* 11.1* 12.3   HCT 34.8* 37.0 38.9  MCV 85.1 85.6 84.4  MCH 26.4 25.7* 26.7  MCHC 31.0 30.0 31.6  RDW 16.2* 16.3* 16.3*  PLT 154 174 205  Thyroid  Recent Labs  Lab 12/08/2021 1817  TSH 2.271    BNP Recent Labs  Lab 12/12/2021 1737  BNP 2,812.3*    DDimer No results for input(s): DDIMER in the last 168 hours.   Radiology/Studies:  DG Chest Portable 1 View  Result Date: 12/11/2021 CLINICAL DATA:  Short of breath, not feeling well for 2 weeks EXAM: PORTABLE CHEST 1 VIEW COMPARISON:  12/09/2021 FINDINGS: Single frontal view of the chest demonstrates a stable cardiac silhouette. Persistent bilateral pleural effusions, left greater than right, minimally increased since prior study. Increased consolidation at the left lung base may reflect airspace disease or atelectasis. Diffuse interstitial prominence is again noted, with slight increase in interstitial and ground-glass opacities at the lung bases consistent with worsening of volume status. No pneumothorax. No acute bony abnormality. IMPRESSION: 1. Worsening volume status, with developing bibasilar edema and slight enlargement of bilateral pleural effusions. Electronically Signed   By: Randa Ngo M.D.   On: 12/06/2021 17:59     Assessment and Plan:   Acute on chronic systolic heart failure: The patient has developed signs and symptoms attributable to acute on chronic systolic heart failure.  She is improving with aggressive diuresis.  She is not in cardiogenic shock based on my examination as she is warm, well-perfused, and mentating well.  Her blood pressure had been low but seems to be improving with aggressive diuresis.  I reviewed her imaging and cardiac catheterization data.  On my review of her echocardiogram she has functional mitral regurgitation with poor leaflet coaptation (from functional MR due to atrial MR/NICM).  One important issue is the fact that her mean mitral valve gradient on her TTE and TEE were both around 5 mmHg.  This has  relevance due to potentially causing iatrogenic mitral stenosis.  However in this case, a higher gradient can be tolerated since the patient has a relatively low functional capacity at baseline.  The prime objective with considering a transcatheter edge-to-edge mitral repair would be to prevent recurrent heart failure admission and potentially allow for up titration of medical therapy.  Of note she does have a IVCD that is relatively narrow with a duration of less than 140 ms and I am not sure she would benefit from CRT.  I had a long talk with the patient and her family about potential options.  I will review the imaging study with our structural imagers.  I do not think coronary angiography is required.  If she were to decompensate, an IABP may be required.  At this time, she is not in cardiogenic shock and her Cr is improving with normal LFTs.  Will follow with you.  Risk Assessment/Risk Scores:      STS score  Procedure: Isolated MVR Risk of Mortality: 14.779% Renal Failure: 5.409% Permanent Stroke: 3.251% Prolonged Ventilation: 32.417% DSW Infection: 0.115% Reoperation: 5.853% Morbidity or Mortality: 40.574% Short Length of Stay: 5.983% Long Length of Stay: 27.833%   Procedure: MV Repair Risk of Mortality: 12.006% Renal Failure: 4.115% Permanent Stroke: 3.036% Prolonged Ventilation: 32.646% DSW Infection: 0.060% Reoperation: 5.644% Morbidity or Mortality: 38.356% Short Length of Stay: 4.735% Long Length of Stay: 36.323%   ________________________   [facility] Cardiomyopathy Questionnaire  KCCQ-12 01/11/2022  1 a. Ability to shower/bathe Extremely limited  1 b. Ability to walk 1 block Extremely limited  1 c. Ability to hurry/jog Extremely limited  2. Edema feet/ankles/legs Every morning  3. Limited by fatigue All of the time  4. Limited by dyspnea All of  the time  5. Sitting up / on 3+ pillows Every night  6. Limited enjoyment of life Extremely limited   7. Rest of life w/ symptoms Not at all satisfied  8 a. Participation in hobbies Severely limited  8 b. Participation in chores Severely limited  8 c. Visiting family/friends Severely limited      New York Heart Association (NYHA) Functional Class NYHA Class III  CHA2DS2-VASc Score = 8   This indicates a 10.8% annual risk of stroke. The patient's score is based upon: CHF History: 1 HTN History: 1 Diabetes History: 0 Stroke History: 2 Vascular Disease History: 1 Age Score: 2 Gender Score: 1        For questions or updates, please contact [place] Please consult [email or web address] for contact info under    Signed, Early Osmond, MD  01/05/2022 12:38 PM

## 2022-01-05 DEATH — deceased

## 2022-01-06 ENCOUNTER — Encounter (HOSPITAL_COMMUNITY): Admission: EM | Disposition: E | Payer: Self-pay | Source: Home / Self Care | Attending: Family Medicine

## 2022-01-06 ENCOUNTER — Telehealth: Payer: Self-pay

## 2022-01-06 DIAGNOSIS — I2510 Atherosclerotic heart disease of native coronary artery without angina pectoris: Secondary | ICD-10-CM

## 2022-01-06 DIAGNOSIS — I480 Paroxysmal atrial fibrillation: Secondary | ICD-10-CM | POA: Diagnosis not present

## 2022-01-06 DIAGNOSIS — N179 Acute kidney failure, unspecified: Secondary | ICD-10-CM | POA: Diagnosis not present

## 2022-01-06 DIAGNOSIS — I5043 Acute on chronic combined systolic (congestive) and diastolic (congestive) heart failure: Secondary | ICD-10-CM | POA: Diagnosis not present

## 2022-01-06 HISTORY — PX: RIGHT/LEFT HEART CATH AND CORONARY ANGIOGRAPHY: CATH118266

## 2022-01-06 LAB — POCT I-STAT EG7
Acid-Base Excess: 6 mmol/L — ABNORMAL HIGH (ref 0.0–2.0)
Acid-Base Excess: 7 mmol/L — ABNORMAL HIGH (ref 0.0–2.0)
Bicarbonate: 31.8 mmol/L — ABNORMAL HIGH (ref 20.0–28.0)
Bicarbonate: 32.8 mmol/L — ABNORMAL HIGH (ref 20.0–28.0)
Calcium, Ion: 1 mmol/L — ABNORMAL LOW (ref 1.15–1.40)
Calcium, Ion: 1.18 mmol/L (ref 1.15–1.40)
HCT: 31 % — ABNORMAL LOW (ref 36.0–46.0)
HCT: 34 % — ABNORMAL LOW (ref 36.0–46.0)
Hemoglobin: 10.5 g/dL — ABNORMAL LOW (ref 12.0–15.0)
Hemoglobin: 11.6 g/dL — ABNORMAL LOW (ref 12.0–15.0)
O2 Saturation: 64 %
O2 Saturation: 68 %
Potassium: 3.2 mmol/L — ABNORMAL LOW (ref 3.5–5.1)
Potassium: 3.6 mmol/L (ref 3.5–5.1)
Sodium: 138 mmol/L (ref 135–145)
Sodium: 141 mmol/L (ref 135–145)
TCO2: 33 mmol/L — ABNORMAL HIGH (ref 22–32)
TCO2: 34 mmol/L — ABNORMAL HIGH (ref 22–32)
pCO2, Ven: 48.9 mmHg (ref 44.0–60.0)
pCO2, Ven: 49.5 mmHg (ref 44.0–60.0)
pH, Ven: 7.421 (ref 7.250–7.430)
pH, Ven: 7.429 (ref 7.250–7.430)
pO2, Ven: 33 mmHg (ref 32.0–45.0)
pO2, Ven: 35 mmHg (ref 32.0–45.0)

## 2022-01-06 LAB — COOXEMETRY PANEL
Carboxyhemoglobin: 1 % (ref 0.5–1.5)
Methemoglobin: 0.8 % (ref 0.0–1.5)
O2 Saturation: 71.8 %
Total hemoglobin: 11 g/dL — ABNORMAL LOW (ref 12.0–16.0)

## 2022-01-06 LAB — POCT I-STAT 7, (LYTES, BLD GAS, ICA,H+H)
Acid-Base Excess: 7 mmol/L — ABNORMAL HIGH (ref 0.0–2.0)
Bicarbonate: 31.7 mmol/L — ABNORMAL HIGH (ref 20.0–28.0)
Calcium, Ion: 1.13 mmol/L — ABNORMAL LOW (ref 1.15–1.40)
HCT: 32 % — ABNORMAL LOW (ref 36.0–46.0)
Hemoglobin: 10.9 g/dL — ABNORMAL LOW (ref 12.0–15.0)
O2 Saturation: 100 %
Potassium: 3.6 mmol/L (ref 3.5–5.1)
Sodium: 137 mmol/L (ref 135–145)
TCO2: 33 mmol/L — ABNORMAL HIGH (ref 22–32)
pCO2 arterial: 43.9 mmHg (ref 32.0–48.0)
pH, Arterial: 7.467 — ABNORMAL HIGH (ref 7.350–7.450)
pO2, Arterial: 202 mmHg — ABNORMAL HIGH (ref 83.0–108.0)

## 2022-01-06 LAB — HEPARIN LEVEL (UNFRACTIONATED)
Heparin Unfractionated: <0.1 IU/mL — ABNORMAL LOW (ref 0.30–0.70)
Heparin Unfractionated: 0.96 IU/mL — ABNORMAL HIGH (ref 0.30–0.70)

## 2022-01-06 LAB — CBC WITH DIFFERENTIAL/PLATELET
Abs Immature Granulocytes: 0.02 K/uL (ref 0.00–0.07)
Basophils Absolute: 0 K/uL (ref 0.0–0.1)
Basophils Relative: 1 %
Eosinophils Absolute: 0.1 K/uL (ref 0.0–0.5)
Eosinophils Relative: 1 %
HCT: 34.3 % — ABNORMAL LOW (ref 36.0–46.0)
Hemoglobin: 10.9 g/dL — ABNORMAL LOW (ref 12.0–15.0)
Immature Granulocytes: 0 %
Lymphocytes Relative: 20 %
Lymphs Abs: 1.3 K/uL (ref 0.7–4.0)
MCH: 26.6 pg (ref 26.0–34.0)
MCHC: 31.8 g/dL (ref 30.0–36.0)
MCV: 83.7 fL (ref 80.0–100.0)
Monocytes Absolute: 0.9 K/uL (ref 0.1–1.0)
Monocytes Relative: 15 %
Neutro Abs: 4 K/uL (ref 1.7–7.7)
Neutrophils Relative %: 63 %
Platelets: 178 K/uL (ref 150–400)
RBC: 4.1 MIL/uL (ref 3.87–5.11)
RDW: 16.3 % — ABNORMAL HIGH (ref 11.5–15.5)
WBC: 6.3 K/uL (ref 4.0–10.5)
nRBC: 0 % (ref 0.0–0.2)

## 2022-01-06 LAB — BASIC METABOLIC PANEL
Anion gap: 8 (ref 5–15)
BUN: 21 mg/dL (ref 8–23)
CO2: 32 mmol/L (ref 22–32)
Calcium: 9.1 mg/dL (ref 8.9–10.3)
Chloride: 97 mmol/L — ABNORMAL LOW (ref 98–111)
Creatinine, Ser: 1.37 mg/dL — ABNORMAL HIGH (ref 0.44–1.00)
GFR, Estimated: 39 mL/min — ABNORMAL LOW (ref >60)
Glucose, Bld: 117 mg/dL — ABNORMAL HIGH (ref 70–99)
Potassium: 3.9 mmol/L (ref 3.5–5.1)
Sodium: 137 mmol/L (ref 135–145)

## 2022-01-06 LAB — APTT: aPTT: 31 seconds (ref 24–36)

## 2022-01-06 LAB — MAGNESIUM: Magnesium: 2.3 mg/dL (ref 1.7–2.4)

## 2022-01-06 SURGERY — RIGHT/LEFT HEART CATH AND CORONARY ANGIOGRAPHY
Anesthesia: LOCAL

## 2022-01-06 MED ORDER — HEPARIN SODIUM (PORCINE) 1000 UNIT/ML IJ SOLN
INTRAMUSCULAR | Status: DC | PRN
  Administered 2022-01-06: 2500 [IU] via INTRAVENOUS

## 2022-01-06 MED ORDER — HEPARIN (PORCINE) 25000 UT/250ML-% IV SOLN: 800.0000 [IU]/h | INTRAVENOUS | Status: DC

## 2022-01-06 MED ORDER — SODIUM CHLORIDE 0.9 % IV SOLN
250.0000 mL | INTRAVENOUS | Status: DC | PRN
  Administered 2022-01-14: 250 mL via INTRAVENOUS

## 2022-01-06 MED ORDER — LIDOCAINE HCL (PF) 1 % IJ SOLN
INTRAMUSCULAR | Status: DC | PRN
  Administered 2022-01-06: 2 mL
  Administered 2022-01-06: 1 mL

## 2022-01-06 MED ORDER — SODIUM CHLORIDE 0.9 % IV SOLN: 250.0000 mL | INTRAVENOUS | Status: DC | PRN

## 2022-01-06 MED ORDER — LABETALOL HCL 5 MG/ML IV SOLN: 10.0000 mg | INTRAVENOUS | Status: AC | PRN

## 2022-01-06 MED ORDER — HYDRALAZINE HCL 20 MG/ML IJ SOLN: 10.0000 mg | INTRAMUSCULAR | Status: AC | PRN

## 2022-01-06 MED ORDER — VERAPAMIL HCL 2.5 MG/ML IV SOLN
INTRAVENOUS | Status: DC | PRN
  Administered 2022-01-06: 10 mL via INTRA_ARTERIAL

## 2022-01-06 MED ORDER — IOHEXOL 350 MG/ML SOLN
INTRAVENOUS | Status: DC | PRN
  Administered 2022-01-06: 30 mL

## 2022-01-06 MED ORDER — SODIUM CHLORIDE 0.9% FLUSH: 3.0000 mL | INTRAVENOUS | Status: DC | PRN

## 2022-01-06 MED ORDER — HEPARIN BOLUS VIA INFUSION
2000.0000 [IU] | Freq: Once | INTRAVENOUS | Status: AC
Start: 2022-01-06 — End: 2022-01-06
  Administered 2022-01-06: 2000 [IU] via INTRAVENOUS
  Filled 2022-01-06: qty 2000

## 2022-01-06 MED ORDER — ACETAMINOPHEN 325 MG PO TABS
650.0000 mg | ORAL_TABLET | ORAL | Status: DC | PRN
  Administered 2022-01-08 – 2022-01-12 (×7): 650 mg via ORAL
  Filled 2022-01-06 (×7): qty 2

## 2022-01-06 MED ORDER — GUAIFENESIN-DM 100-10 MG/5ML PO SYRP
5.0000 mL | ORAL_SOLUTION | ORAL | Status: DC | PRN
  Administered 2022-01-06 – 2022-01-10 (×9): 5 mL via ORAL
  Filled 2022-01-06 (×10): qty 5

## 2022-01-06 MED ORDER — DIPHENHYDRAMINE HCL 50 MG/ML IJ SOLN
INTRAMUSCULAR | Status: AC
  Filled 2022-01-06: qty 1

## 2022-01-06 MED ORDER — METHYLPREDNISOLONE SODIUM SUCC 125 MG IJ SOLR
INTRAMUSCULAR | Status: DC | PRN
  Administered 2022-01-06: 80 mg via INTRAVENOUS

## 2022-01-06 MED ORDER — HEPARIN (PORCINE) IN NACL 1000-0.9 UT/500ML-% IV SOLN
INTRAVENOUS | Status: DC | PRN
  Administered 2022-01-06 (×2): 500 mL

## 2022-01-06 MED ORDER — SODIUM CHLORIDE 0.9 % IV SOLN: INTRAVENOUS | Status: DC

## 2022-01-06 MED ORDER — DIPHENHYDRAMINE HCL 50 MG/ML IJ SOLN
INTRAMUSCULAR | Status: DC | PRN
  Administered 2022-01-06: 25 mg via INTRAVENOUS

## 2022-01-06 MED ORDER — SODIUM CHLORIDE 0.9% FLUSH
3.0000 mL | Freq: Two times a day (BID) | INTRAVENOUS | Status: DC
  Administered 2022-01-06 – 2022-01-07 (×2): 3 mL via INTRAVENOUS

## 2022-01-06 MED ORDER — ASPIRIN 81 MG PO CHEW
81.0000 mg | CHEWABLE_TABLET | ORAL | Status: AC
  Administered 2022-01-06: 81 mg via ORAL
  Filled 2022-01-06: qty 1

## 2022-01-06 MED ORDER — HEPARIN SODIUM (PORCINE) 1000 UNIT/ML IJ SOLN
INTRAMUSCULAR | Status: AC
  Filled 2022-01-06: qty 10

## 2022-01-06 MED ORDER — HEPARIN (PORCINE) IN NACL 1000-0.9 UT/500ML-% IV SOLN
INTRAVENOUS | Status: AC
  Filled 2022-01-06: qty 1000

## 2022-01-06 MED ORDER — LIDOCAINE HCL (PF) 1 % IJ SOLN
INTRAMUSCULAR | Status: AC
  Filled 2022-01-06: qty 30

## 2022-01-06 MED ORDER — METHYLPREDNISOLONE SODIUM SUCC 125 MG IJ SOLR
INTRAMUSCULAR | Status: AC
  Filled 2022-01-06: qty 2

## 2022-01-06 MED ORDER — VERAPAMIL HCL 2.5 MG/ML IV SOLN
INTRAVENOUS | Status: AC
  Filled 2022-01-06: qty 2

## 2022-01-06 MED ORDER — HEPARIN (PORCINE) 25000 UT/250ML-% IV SOLN
1300.0000 [IU]/h | INTRAVENOUS | Status: DC
  Administered 2022-01-06: 800 [IU]/h via INTRAVENOUS
  Administered 2022-01-08 – 2022-01-09 (×2): 1100 [IU]/h via INTRAVENOUS
  Administered 2022-01-10 – 2022-01-11 (×3): 1200 [IU]/h via INTRAVENOUS
  Administered 2022-01-12 – 2022-01-13 (×2): 1300 [IU]/h via INTRAVENOUS
  Filled 2022-01-06 (×8): qty 250

## 2022-01-06 MED ORDER — SODIUM CHLORIDE 0.9 % IV SOLN
510.0000 mg | Freq: Once | INTRAVENOUS | Status: AC
  Administered 2022-01-06: 510 mg via INTRAVENOUS
  Filled 2022-01-06: qty 510

## 2022-01-06 MED ORDER — ONDANSETRON HCL 4 MG/2ML IJ SOLN
4.0000 mg | Freq: Four times a day (QID) | INTRAMUSCULAR | Status: DC | PRN
  Administered 2022-01-14 – 2022-01-15 (×2): 4 mg via INTRAVENOUS
  Filled 2022-01-06 (×2): qty 2

## 2022-01-06 MED ORDER — SODIUM CHLORIDE 0.9% FLUSH
3.0000 mL | Freq: Two times a day (BID) | INTRAVENOUS | Status: DC
  Administered 2022-01-08 – 2022-01-16 (×10): 3 mL via INTRAVENOUS

## 2022-01-06 MED ORDER — TRAZODONE HCL 50 MG PO TABS
50.0000 mg | ORAL_TABLET | Freq: Once | ORAL | Status: AC
  Administered 2022-01-06: 50 mg via ORAL
  Filled 2022-01-06: qty 1

## 2022-01-06 MED ORDER — ASPIRIN 81 MG PO CHEW: 81.0000 mg | CHEWABLE_TABLET | ORAL | Status: DC

## 2022-01-06 SURGICAL SUPPLY — 12 items
CATH BALLN WEDGE 5F 110CM (CATHETERS) ×1 IMPLANT
CATH INFINITI 4FR 3 DRC (CATHETERS) ×1 IMPLANT
CATH INFINITI 4FR JL3.5 (CATHETERS) ×1 IMPLANT
DEVICE RAD COMP TR BAND LRG (VASCULAR PRODUCTS) IMPLANT
DEVICE RAD TR BAND REGULAR (VASCULAR PRODUCTS) ×1 IMPLANT
GLIDESHEATH SLEND SS 6F .021 (SHEATH) ×1 IMPLANT
GUIDEWIRE INQWIRE 1.5J.035X260 (WIRE) IMPLANT
INQWIRE 1.5J .035X260CM (WIRE) ×2
PACK CARDIAC CATHETERIZATION (CUSTOM PROCEDURE TRAY) ×2 IMPLANT
SHEATH GLIDE SLENDER 4/5FR (SHEATH) ×1 IMPLANT
TRANSDUCER W/STOPCOCK (MISCELLANEOUS) ×2 IMPLANT
WIRE HI TORQ VERSACORE-J 145CM (WIRE) ×1 IMPLANT

## 2022-01-06 NOTE — Progress Notes (Signed)
ANTICOAGULATION CONSULT NOTE - Follow Up Consult  Pharmacy Consult for heparin Indication: atrial fibrillation  Labs: Recent Labs    12/08/2021 1913 12/20/2021 2113 01/04/22 0316 01/05/22 1011 01/31/2022 0130  HGB 10.8*  --  11.1* 12.3 10.9*  HCT 34.8*  --  37.0 38.9 34.3*  PLT 154  --  174 205 178  APTT  --   --   --   --  31  HEPARINUNFRC  --   --   --   --  <0.10*  CREATININE 1.73*  --  1.58* 1.53* 1.37*  TROPONINIHS 13 13  --   --   --     Assessment: 80yo female subtherapeutic on heparin with initial dosing for Afib; no infusion issues or signs of bleeding per RN.  Goal of Therapy:  Heparin level 0.3-0.7 units/ml   Plan:  Will give heparin 2000 units IV bolus x1 and increase heparin infusion by 4 units/kg/hr to 800 units/hr and check level in 8 hours.    Wynona Neat, PharmD, BCPS  01/27/2022,3:02 AM

## 2022-01-06 NOTE — H&P (View-Only) (Signed)
Advanced Heart Failure Rounding Note  PCP-Cardiologist: Will Meredith Leeds, MD   Subjective:   2/1 A fib RVR--> Started on amio drip + heparin drip. PICC placed. Diuretics held.   CO-OX 72%   Creatinine 1.7>1.5>1.4   Had a hard time sleeping. At home she usually takes 2 trazadone and melatonin at night. Complaining of fatigue.     Objective:   Weight Range: 52.2 kg Body mass index is 19.75 kg/m.   Vital Signs:   Temp:  [98 F (36.7 C)-98.1 F (36.7 C)] 98.1 F (36.7 C) (02/01 2300) Pulse Rate:  [90-115] 100 (02/02 0430) Resp:  [14-26] 20 (02/02 0430) BP: (83-127)/(56-96) 101/70 (02/02 0630) SpO2:  [90 %-99 %] 93 % (02/02 0430) Weight:  [52.2 kg] 52.2 kg (02/02 0500)    Weight change: Filed Weights   01/05/22 1352 01/11/2022 0500  Weight: 52.2 kg 52.2 kg    Intake/Output:   Intake/Output Summary (Last 24 hours) at 01/23/2022 0719 Last data filed at 01/27/2022 0452 Gross per 24 hour  Intake 1066.8 ml  Output 450 ml  Net 616.8 ml      Physical Exam   General: No resp difficulty. Sitting on the side of the bed.  HEENT: Normal Neck: Supple. JVP 6-7 . Carotids 2+ bilat; no bruits. No lymphadenopathy or thyromegaly appreciated. Cor: PMI nondisplaced. Tachy Irregular rate & rhythm. No rubs, gallops. 3/6 MR. Lungs: Clear. No wheeze. On 2 liters [place] Abdomen: Soft, nontender, nondistended. No hepatosplenomegaly. No bruits or masses. Good bowel sounds. Extremities: No cyanosis, clubbing, rash, edema. RUE PICC Neuro: Alert & orientedx3, cranial nerves grossly intact. moves all 4 extremities w/o difficulty. Affect flat   Telemetry   A fib RVR 100-110s. Occasional PVCs < 20 per hour  EKG    N/A  Labs    CBC Recent Labs    01/05/22 1011 01/25/2022 0130  WBC 7.2 6.3  NEUTROABS 4.7 4.0  HGB 12.3 10.9*  HCT 38.9 34.3*  MCV 84.4 83.7  PLT 205 681   Basic Metabolic Panel Recent Labs    01/04/22 0316 01/05/22 1011 01/31/2022 0130  NA 138 138 137  K  4.1 3.5 3.9  CL 101 97* 97*  CO2 27 32 32  GLUCOSE 83 127* 117*  BUN 29* 25* 21  CREATININE 1.58* 1.53* 1.37*  CALCIUM 8.9 9.3 9.1  MG 1.7  --   --   PHOS 3.6  --   --    Liver Function Tests Recent Labs    01/02/2022 1913 01/04/22 0316  AST 19 19  ALT 18 19  ALKPHOS 88 92  BILITOT 0.4 0.6  PROT 5.4* 5.8*  ALBUMIN 3.2* 3.5   No results for input(s): LIPASE, AMYLASE in the last 72 hours. Cardiac Enzymes No results for input(s): CKTOTAL, CKMB, CKMBINDEX, TROPONINI in the last 72 hours.  BNP: BNP (last 3 results) Recent Labs    12/31/2021 1737  BNP 2,812.3*    ProBNP (last 3 results) No results for input(s): PROBNP in the last 8760 hours.   D-Dimer No results for input(s): DDIMER in the last 72 hours. Hemoglobin A1C No results for input(s): HGBA1C in the last 72 hours. Fasting Lipid Panel No results for input(s): CHOL, HDL, LDLCALC, TRIG, CHOLHDL, LDLDIRECT in the last 72 hours. Thyroid Function Tests Recent Labs    01/02/2022 1817  TSH 2.271    Other results:   Imaging    DG Chest Port 1 View  Addendum Date: 01/05/2022   ADDENDUM REPORT:  01/05/2022 19:29 ADDENDUM: There is a right upper extremity PICC line terminating in the mid SVC. Electronically Signed   By: Ronney Asters M.D.   On: 01/05/2022 19:29   Result Date: 01/05/2022 CLINICAL DATA:  New right-sided PICC line. EXAM: PORTABLE CHEST 1 VIEW COMPARISON:  Chest x-ray 12/06/2021. FINDINGS: There stable moderate left and small right pleural effusions. Central pulmonary vascular congestion persists. The heart is enlarged, unchanged. There is no pneumothorax or acute fracture. Thoracolumbar fusion hardware is partially visualized. IMPRESSION: 1. Stable bilateral pleural effusions. 2. Stable cardiomegaly with central pulmonary vascular congestion. Electronically Signed: By: Ronney Asters M.D. On: 01/05/2022 18:32   [place] EKG SITE RITE  Result Date: 01/05/2022 If Site Rite image not attached, placement could not be  confirmed due to current cardiac rhythm.    Medications:     Scheduled Medications:  buPROPion  300 mg Oral Daily   Chlorhexidine Gluconate Cloth  6 each Topical Daily   colestipol  1 g Oral BID   ezetimibe  10 mg Oral Daily   ferrous sulfate  325 mg Oral Q breakfast   fluticasone  1 spray Each Nare BID   hydroxychloroquine  400 mg Oral QHS   levothyroxine  25 mcg Oral QAC breakfast   loratadine  10 mg Oral Daily   montelukast  10 mg Oral Daily   pantoprazole  40 mg Oral QHS   sodium chloride flush  10-40 mL Intracatheter Q12H    Infusions:  amiodarone 30 mg/hr (01/10/2022 0706)   heparin 800 Units/hr (01/10/2022 0452)    PRN Medications: acetaminophen **OR** acetaminophen, albuterol, guaiFENesin-dextromethorphan, ipratropium, melatonin, sodium chloride flush, traZODone    Patient Profile   [name] is a 81 year old with a history of PAF, moderate AS, severe MR, COPD, quit smoking 25 years ago, PAD, HTN, hyperlipidemia, CVA, NICM, tobacco abuse, ischemic bowel with resection in 2017,11/04/22 left ureteral calculus S/P utreteroscopy and stent,  and chronic HFrEF.     Admitted A/C HFrEF and A fib RVR.   Assessment/Plan   A/C HFrEF -Had LHC in 2017 minimal disease.  -Echo this admit EF down to 25% from previous Echo EF 40-45% in 2018 .  BNP on admit > 2800.  - CO-OX 72%. No need for inotropes.  - Set up CVP  - Volume status ok. Hold diuretics.   - No room for GDMT with low bp.  -Stop metoprolol tartrate ->reduced EF and suspected low output.  -Follow closely.  -Renal function ok.  - Set up RHC/LHC tomorrow.    2. Severe MR  -12/28/21 TEE with severe MR -Structural heart has consulted    3. PAF--> In A fib RVR - Last dose of eliquis 12/13/2021 (morning)  2/1 metoprolol tartrate stopped  2/1 started on amio drip for rate control.  - Remains tachy but rate now < 120s. Continue  amio drip for rate control - Continue  heparin drip now.     4. AKI  -Recently had  diuretics increased on 1/5. Creatinine on that time was 1.2.  -On admit creatinine was 1.7, today 1.4 - Check BMET in am.    5. A/C Respiratory Failure -H/O COPD with triple inhalers and recently placed on oxygen 12/17/21.  - at home on triple inhalers.  --No wheeze on exam  - Continue oxygen    6. Anemia  Hgb 12. Iron Sats 7%.   7. Hypothyroid -TSH ok on admit  -On levothyroxine.   Will discuss timing of procedures with Dr  Zaine Elsass. Will need TEE/DC-CV.      Length of Stay: 3  Amy Clegg, NP  01/23/2022, 7:19 AM  Advanced Heart Failure Team Pager [phone] (M-F; 7a - 5p)  Please contact [facility] for night-coverage after hours (5p -7a ) and weekends on [email or web address]  Patient seen and examined with the above-signed Advanced Practice Provider and/or Housestaff. I personally reviewed laboratory data, imaging studies and relevant notes. I independently examined the patient and formulated the important aspects of the plan. I have edited the note to reflect any of my changes or salient points. I have personally discussed the plan with the patient and/or family.  Remains in AF with RVR on IV amio. Still feels weak. Breathing improved.   CVP 2-3 (checked personally) Co-ox 72%   General:  Elderly weak appearing. No resp difficulty HEENT: normal Neck: supple. no JVD. Carotids 2+ bilat; no bruits. No lymphadenopathy or thryomegaly appreciated. Cor: PMI nondisplaced. Irregular tachy soft MR Lungs: clear Abdomen: soft, nontender, nondistended. No hepatosplenomegaly. No bruits or masses. Good bowel sounds. Extremities: no cyanosis, clubbing, rash, edema Neuro: alert & orientedx3, cranial nerves grossly intact. moves all 4 extremities w/o difficulty. Affect pleasant  She remains tenuous but co-ox and volume status look good. Remains in AF with RVR.  Based on echo in 12/22 LV dysfunction preceded recurrence of AF so doubt this is simply tachy-CM. Will plan R/L cath later today followed  by possible TEE/DC-CV tomorrow. If not improving with restoration of NSR and GDMT will need to consider MitraClip. Structural team on board. Appreciate input. Continue heparin and IV amio. Mobilize with PT/OT.   Glori Bickers, MD  9:24 AM

## 2022-01-06 NOTE — Progress Notes (Signed)
HPI: [name] is a 81 y.o. female with medical history significant for chronic systolic/diastolic heart failure, with most recent LVEF 25 to 30%, paroxysmal atrial fibrillation complicated by sick sinus syndrome, COPD, hyperlipidemia, anemia of chronic disease, who is admitted to [facility] on 12/23/2021 with acute on chronic systolic/diastolic heart failure after presenting from home to [facility] ED complaining of shortness of breath.   The patient reports 4 to 5 days of progressive shortness of breath associated orthopnea and worsening of edema in the bilateral lower extremities.  She reports associated nonproductive cough in the absence of any associated subjective fever, chills, rigors, or generalized myalgias.  Denies any associated chest pain, palpitations, diaphoresis, nausea, vomiting, dizziness, presyncope, or syncope.  Denies any new onset calf tenderness or new lower extremity erythema.  No hemoptysis.  Also denies any recent melena or hematochezia.  No recent rhinitis, rhinorrhea, sore throat, wheezing, rash.  She reports good compliance with her outpatient diuretic regimen which consists of Lasix 40 mg p.o. daily.  She confirms a history of chronic systolic/diastolic heart failure, with most recent echocardiogram from 11/26/2021 notable for LVEF 25 to 30% with global left ventricular hypokinesis, grade 2 diastolic dysfunction.  Per chart review, it appears that the nature of her chronic heart failure is felt to be on the basis of nonischemic cardiomyopathy.  She also has a history of COPD acknowledging that she is a former smoker, and completely quit smoking approximately 25 years ago.  She follows with pulmonology as an outpatient, with recent associated appointment revealing pulmonology's opinion that her progressive shortness of breath is more cardiac than pulmonary in primary origin at this point.   Her diuresis as an outpatient has been complicated by intermittent hypotension.   Additionally, per chart review, her baseline serum creatinine appears to be 1.2-1.3, with most recent prior value noted to be 1.17 on 12/09/2021.   ED Course:  Vital signs in the ED were notable for the following: Afebrile; heart rate 93-1 05; blood pressure 87/62 - 106/82, respiratory rate 14-24, oxygen saturation 97 to 100% on room air.  Labs were notable for the following: CMP was notable for the following: Sodium 135, potassium 4.1, creatinine 1.73.  High-sensitivity troponin I x2 values were both found to be 13.  BNP 2800 compared to most recent prior value of 340 in 2017.  CBC notable for episode count 5200, hemoglobin 10.8 relative most recent prior value of 12 in April 2022.  Urinalysis showed no white blood cells, no red blood cells, and was negative for protein.  COVID-19/influenza PCR found to be negative.  Imaging and additional notable ED work-up: EKG shows atrial fibrillation with interventricular conduction delay, heart rate 108, no evidence of T wave or ST changes, including no evidence of ST elevation.  Chest x-ray, in comparison to most recent prior chest x-ray from 12/09/2021 showed interval worsening of bilateral pulmonary edema as well as interval increase in size of the bilateral pleural effusions, while demonstrating no evidence of infiltrate or pneumothorax.  EDP discussed the patient's case with on-call cardiology, who will formally consult, with additional recommendations pending at this time.  While in the ED, the following were administered: Lasix 80 mg IV x1.  Subsequently, the patient was admitted to the PCU for further evaluation management of presenting acute on chronic systolic/diastolic heart failure complicated by borderline hypotension as well as acute kidney injury.  Subjective Reports she is feeling much better today  Objective    Physical Exam: Vitals:  01/29/2022 0600 01/15/2022 0630 01/27/2022 0800 01/12/2022 0802  BP: (!) 93/56 101/70 (!) 84/65 93/78  Pulse:     (!) 120  Resp:   20 19  Temp:   98.1 F (36.7 C)   TempSrc:   Oral   SpO2:   95% 96%  Weight:      Height:        General: appears to be stated age; alert, oriented; mildly increased work of breathing noted Skin: warm, dry, no rash Head:  AT/[place] Mouth:  Oral mucosa membranes appear moist, normal dentition Neck: supple; trachea midline Heart:  RRR; did not appreciate any M/R/G Lungs: Bibasilar crackles noted, but otherwise CTAB, did not appreciate any wheezes,or rhonchi Abdomen: + BS; soft, ND, NT Vascular: 2+ pedal pulses b/l; 2+ radial pulses b/l Extremities: 1-2+ edema in the bilateral lower extremities, no muscle wasting Neuro: strength and sensation intact in upper and lower extremities b/l     Labs on Admission: I have personally reviewed following labs and imaging studies  CBC: Recent Labs  Lab 12/28/2021 1913 01/04/22 0316 01/05/22 1011 01/12/2022 0130  WBC 5.2 6.3 7.2 6.3  NEUTROABS 3.4 4.1 4.7 4.0  HGB 10.8* 11.1* 12.3 10.9*  HCT 34.8* 37.0 38.9 34.3*  MCV 85.1 85.6 84.4 83.7  PLT 154 174 205 762   Basic Metabolic Panel: Recent Labs  Lab 12/14/2021 1913 01/04/22 0316 01/05/22 1011 01/18/2022 0130  NA 135 138 138 137  K 4.1 4.1 3.5 3.9  CL 98 101 97* 97*  CO2 27 27 32 32  GLUCOSE 94 83 127* 117*  BUN 30* 29* 25* 21  CREATININE 1.73* 1.58* 1.53* 1.37*  CALCIUM 8.8* 8.9 9.3 9.1  MG  --  1.7  --  2.3  PHOS  --  3.6  --   --    GFR: Estimated Creatinine Clearance: 26.1 mL/min (A) (by C-G formula based on SCr of 1.37 mg/dL (H)). Liver Function Tests: Recent Labs  Lab 12/27/2021 1913 01/04/22 0316  AST 19 19  ALT 18 19  ALKPHOS 88 92  BILITOT 0.4 0.6  PROT 5.4* 5.8*  ALBUMIN 3.2* 3.5   No results for input(s): LIPASE, AMYLASE in the last 168 hours. No results for input(s): AMMONIA in the last 168 hours. Coagulation Profile: No results for input(s): INR, PROTIME in the last 168 hours. Cardiac Enzymes: No results for input(s): CKTOTAL, CKMB,  CKMBINDEX, TROPONINI in the last 168 hours. BNP (last 3 results) No results for input(s): PROBNP in the last 8760 hours. HbA1C: No results for input(s): HGBA1C in the last 72 hours. CBG: No results for input(s): GLUCAP in the last 168 hours. Lipid Profile: No results for input(s): CHOL, HDL, LDLCALC, TRIG, CHOLHDL, LDLDIRECT in the last 72 hours. Thyroid Function Tests: Recent Labs    12/23/2021 1817  TSH 2.271   Anemia Panel: Recent Labs    01/04/22 0316  FERRITIN 48  TIBC 375  IRON 26*   Urine analysis:    Component Value Date/Time   COLORURINE YELLOW 12/29/2021 2046   APPEARANCEUR CLEAR 12/31/2021 2046   LABSPEC 1.025 12/13/2021 2046   PHURINE 5.5 12/19/2021 2046   GLUCOSEU NEGATIVE 12/30/2021 2046   GLUCOSEU NEGATIVE 10/29/2019 [place] 12/05/2021 2046   BILIRUBINUR NEGATIVE 01/04/2022 2046   KETONESUR NEGATIVE 01/01/2022 2046   PROTEINUR NEGATIVE 12/30/2021 2046   UROBILINOGEN 0.2 10/29/2019 1135   NITRITE NEGATIVE 12/25/2021 2046   LEUKOCYTESUR NEGATIVE 12/29/2021 2046    Radiological Exams on Admission: DG  Chest Port 1 View  Addendum Date: 01/05/2022   ADDENDUM REPORT: 01/05/2022 19:29 ADDENDUM: There is a right upper extremity PICC line terminating in the mid SVC. Electronically Signed   By: Ronney Asters M.D.   On: 01/05/2022 19:29   Result Date: 01/05/2022 CLINICAL DATA:  New right-sided PICC line. EXAM: PORTABLE CHEST 1 VIEW COMPARISON:  Chest x-ray 12/12/2021. FINDINGS: There stable moderate left and small right pleural effusions. Central pulmonary vascular congestion persists. The heart is enlarged, unchanged. There is no pneumothorax or acute fracture. Thoracolumbar fusion hardware is partially visualized. IMPRESSION: 1. Stable bilateral pleural effusions. 2. Stable cardiomegaly with central pulmonary vascular congestion. Electronically Signed: By: Ronney Asters M.D. On: 01/05/2022 18:32   [place] EKG SITE RITE  Result Date: 01/05/2022 If Site Rite  image not attached, placement could not be confirmed due to current cardiac rhythm.    EKG: Independently reviewed, with result as described above.    Assessment/Plan   Principal Problem:   Acute on chronic combined systolic and diastolic CHF (congestive heart failure) (HCC) Active Problems:   HLD (hyperlipidemia)   Atrial fibrillation (HCC)   COPD (chronic obstructive pulmonary disease) (HCC)   Allergic rhinitis   SOB (shortness of breath)   AKI (acute kidney injury) ([place])    #) Acute on chronic systolic/diastolic heart failure: dx of acute decompensation on the basis of presenting 4 to 5 days of progressive shortness of breath associate with orthopnea, worsening of peripheral edema, with chest x-ray showing interval worsening of bilateral pulmonary edema as well as interval increase in size of bilateral pleural effusions, as further described above. This is in the context of a known history of chronic systolic/diastolic heart failure, with most recent echocardiogram performed in December 2022, which was notable for LVEF 25 to 30%, global left ventricular hypokinesis, and grade 2 diastolic dysfunction. Etiology leading to presenting acutely decompensated heart failure unclear. Patient conveys good compliance with home diuretic therapy, which consists of Lasix 40 mg subcu daily, as well as good compliance with their additional home cardiac medications, which include metoprolol succinate.  Of note, outpatient diuresis efforts have been complicated by borderline hypotension, as further detailed above . overall, ACS leading to presenting acutely decompensated heart failure appears less likely at this time in the absence of any recent CP, presenting EKG showing no evidence of acute ischemic changes, as well as non-elevated troponin and spite of aforementioned duration of the patient's symptoms.   Of note, patient received Lasix 80 mg IV x1 while in the ED today. Presentation warrants additional IV  diuresis, as further detailed below, with close monitoring of ensuing renal function, electrolytes,, volume status, and blood pressure as further noted below. Will also closely monitor ensuing HR as an additional means to evaluate volume status and help guide subsequent diuresis decision-making. As patient is already on a BB at home, will plan to continue this.  Of note, cardiology has been consulted.  Of note, the patient is not currently on an ACE inhibitor/ARB nor Isordil/hydralazine as an outpatient,  likely because of the aforementioned ongoing borderline soft blood pressures.    Plan: Seems to be improving.  Holding diuresis and on Amio drip for rate control.  Appreciate cardiology input..  Severe mitral regurg may need clip at some point.  Beta-blocker stopped.  Amio started.  To get left heart cath right heart cath followed by TEE-DC cardioversion.   #) Acute kidney injury: Presenting serum creatinine 1.73 relative to her baseline range of 1.2-1.3, with prior  value noted to be 1.17 on 12/09/2021.  Suspect that this is prerenal in nature, in the setting of diminished renal perfusion gradient as a consequence of acute on chronic systolic/diastolic heart failure, as above.  Of note, urinalysis with microscopy from this evening showed no evidence of white blood cell or red blood cell casts, nor any evidence of protein.  Plan: Further evaluation management of presenting acute on chronic systolic/diastolic heart failure, as above, including plan for IV diuresis.  Monitor strict I's and O's and daily weights.  Tempt avoid nephrotoxic agents.  Add on serum magnesium level.  Creatinine 1.4 today.  #) Paroxysmal atrial fibrillation: Documented history of such. In setting of CHA2DS2-VASc score of  8, there is an indication for chronic anticoagulation for thromboembolic prophylaxis.  It does not appear that she is chronically anticoagulated, and rationale for this is not currently clear to me at this time.   Home AV nodal blocking regimen: Outpatient beta-blocker.  Most recent echocardiogram performed in December 2022, as further detailed above. Presenting EKG suggestive of rate controlled atrial fibrillation.  There is also documentation of complicating factor of sick sinus syndrome, for which she has been following with electrophysiology cardiology as an outpatient.   Plan: monitor strict I's & O's and daily weights.  Amiodarone drip.  Beta-blocker stopped.  Heparin drip.   #) COPD: Documented history of such, with the patient acknowledging that she is a former smoker, and completely quit smoking approximately 25 years ago.  Follows close with outpatient pulmonology, who feel that patient's recent shortness of breath is more on the basis of heart failure then underlying COPD, as above.  Patient ports good compliance with outpatient respiratory regimen which includes scheduled Trelegy as well as prn albuterol inhaler.  Overall, no clinical evidence to suggest acute COPD exacerbation at this time.  Plan: Continue outpatient Trelegy as well as as needed albuterol.  Monitor continuous pulse oximetry.  Add on serum magnesium level and check serum phosphorus level.   #) Hyperlipidemia: documented h/o such.  In the setting of a documented history of statin intolerance, she is on Zetia as outpatient.    Plan: continue home Zetia.    #) Anemia of chronic disease: Documented history of such, with baseline hemoglobin appearing to be approximately 12, with presenting hemoglobin slightly lower than his baseline, with presenting value noted to be 10.8, potentially representing a hemodelusional contribution in the setting of suspected acute volume overload as a consequence of presenting acute on chronic systolic/diastolic heart failure, as above.  No evidence of active bleed at this time.  She is on daily oral iron supplementation as outpatient.  Plan: Repeat CBC in the morning.  Continue home daily oral iron  supplementation.   #) Allergic Rhinitis: documented h/o such, on scheduled intranasal Flonase as outpatient as well as loratadine.    Plan: cont home Flonase and loratadine.    #) acquired hypothyroidism: documented h/o such, on Synthroid as outpatient.   Plan: cont home Synthroid.    #) GERD: documented h/o such; on omeprazole as outpatient.   Plan: continue home PPI.   Chronic respiratory failure-on 3 L of oxygen at home chronically  Obtain physical therapy evaluation   Further recommendations pending on overall hospital course      Mitchael Luckey A MD [facility]

## 2022-01-06 NOTE — Care Management Important Message (Signed)
Important Message  Patient Details  Name: [name] MRN: [id number] Date of Birth: 07/01/1941   Medicare Important Message Given:  Yes     Austina Constantin 01/31/2022, 1:00 PM

## 2022-01-06 NOTE — Plan of Care (Signed)
Discussed with patient in front of husband plan of care for the evening, sleep medications and disease process with some teach back displayed.  Patient no fully understanding A-fib and her fatigue will need reinforcement.  Problem: Education: Goal: Knowledge of General Education information will improve Description: Including pain rating scale, medication(s)/side effects and non-pharmacologic comfort measures Outcome: Progressing   Problem: Health Behavior/Discharge Planning: Goal: Ability to manage health-related needs will improve Outcome: Not Progressing

## 2022-01-06 NOTE — Telephone Encounter (Signed)
[facility] review of 12/28/21 TEE:  "For patient NA: This is secondary MR  This may be a clippable valve. The fossa looks approachable for transseptal puncture in some of the Bicaval and SAXB views, although in others looks very thin and friable.  LA dimensions are large enough for device steering and straddle. The MR is caused by a lack of coaptation and annular dilatation. The posterior leaflet measures about 0.776 cm in the LVOT grasping view. MVA is not provided but appears to be very small. Gradient is 5-21mmhg and high risk for mitral stenosis with MitraClip. Based on this information, I'd like for Dr. Burt Knack & Ali Lowe to review and attempt to provide MVA for final decision."

## 2022-01-06 NOTE — Plan of Care (Signed)
°  Problem: Education: Goal: Knowledge of General Education information will improve Description: Including pain rating scale, medication(s)/side effects and non-pharmacologic comfort measures Outcome: Progressing   Problem: Health Behavior/Discharge Planning: Goal: Ability to manage health-related needs will improve Outcome: Progressing   Problem: Clinical Measurements: Goal: Diagnostic test results will improve Outcome: Progressing Goal: Cardiovascular complication will be avoided Outcome: Progressing   Problem: Activity: Goal: Risk for activity intolerance will decrease Outcome: Progressing   Problem: Coping: Goal: Level of anxiety will decrease Outcome: Progressing   Problem: Skin Integrity: Goal: Risk for impaired skin integrity will decrease Outcome: Progressing

## 2022-01-06 NOTE — Progress Notes (Signed)
ANTICOAGULATION CONSULT NOTE - Follow Up Consult  Pharmacy Consult for heparin Indication: atrial fibrillation  Labs: Recent Labs    12/31/2021 1913 12/21/2021 2113 01/04/22 0316 01/05/22 1011 01/08/2022 0130 01/05/2022 1145  HGB 10.8*  --  11.1* 12.3 10.9*  --   HCT 34.8*  --  37.0 38.9 34.3*  --   PLT 154  --  174 205 178  --   APTT  --   --   --   --  31  --   HEPARINUNFRC  --   --   --   --  <0.10* 0.96*  CREATININE 1.73*  --  1.58* 1.53* 1.37*  --   TROPONINIHS 13 13  --   --   --   --      Assessment: 81 yo W on heparin for atrial fibrillation. Heparin was held for R/L heart cath 2/2 with consult to restart 4 hours after sheath removal.  Patient with TR band. Air removed as at 17:30. TR band will stay on another hour before removal.   Goal of Therapy:  Heparin level 0.3-0.7 units/ml Monitor platelets per anticoagulation protocol: yes   Plan:  Restart heparin 800 units/hr at 2300  Monitor daily heparin level, CBC Monitor for signs/symptoms of bleeding   Benetta Spar, PharmD, BCPS, BCCP Clinical Pharmacist  Please check AMION for all [place] phone numbers After 10:00 PM, call [place]

## 2022-01-06 NOTE — Progress Notes (Addendum)
PT Cancellation Note  Patient Details Name: [name] MRN: [id number] DOB: 1941-08-24   Cancelled Treatment:    Reason Eval/Treat Not Completed: Fatigue/lethargy limiting ability to participate Cancelling PT eval this AM per request of RN as pt has been anxious and has not slept all night. Finally asleep and wanting her to rest. Will follow up as time allows.  Attempted in PM however pt off floor for cardiac cath. Will follow.   Marguarite Arbour A Ensley Blas 01/21/2022, 11:00 AM Marisa Severin, PT, DPT Acute Rehabilitation Services Pager [phone] Office [phone]

## 2022-01-06 NOTE — Progress Notes (Addendum)
Advanced Heart Failure Rounding Note  PCP-Cardiologist: Will Meredith Leeds, MD   Subjective:   2/1 A fib RVR--> Started on amio drip + heparin drip. PICC placed. Diuretics held.   CO-OX 72%   Creatinine 1.7>1.5>1.4   Had a hard time sleeping. At home she usually takes 2 trazadone and melatonin at night. Complaining of fatigue.     Objective:   Weight Range: 52.2 kg Body mass index is 19.75 kg/m.   Vital Signs:   Temp:  [98 F (36.7 C)-98.1 F (36.7 C)] 98.1 F (36.7 C) (02/01 2300) Pulse Rate:  [90-115] 100 (02/02 0430) Resp:  [14-26] 20 (02/02 0430) BP: (83-127)/(56-96) 101/70 (02/02 0630) SpO2:  [90 %-99 %] 93 % (02/02 0430) Weight:  [52.2 kg] 52.2 kg (02/02 0500)    Weight change: Filed Weights   01/05/22 1352 01/20/2022 0500  Weight: 52.2 kg 52.2 kg    Intake/Output:   Intake/Output Summary (Last 24 hours) at 01/22/2022 0719 Last data filed at 01/05/2022 0452 Gross per 24 hour  Intake 1066.8 ml  Output 450 ml  Net 616.8 ml      Physical Exam   General: No resp difficulty. Sitting on the side of the bed.  HEENT: Normal Neck: Supple. JVP 6-7 . Carotids 2+ bilat; no bruits. No lymphadenopathy or thyromegaly appreciated. Cor: PMI nondisplaced. Tachy Irregular rate & rhythm. No rubs, gallops. 3/6 MR. Lungs: Clear. No wheeze. On 2 liters [place] Abdomen: Soft, nontender, nondistended. No hepatosplenomegaly. No bruits or masses. Good bowel sounds. Extremities: No cyanosis, clubbing, rash, edema. RUE PICC Neuro: Alert & orientedx3, cranial nerves grossly intact. moves all 4 extremities w/o difficulty. Affect flat   Telemetry   A fib RVR 100-110s. Occasional PVCs < 20 per hour  EKG    N/A  Labs    CBC Recent Labs    01/05/22 1011 01/23/2022 0130  WBC 7.2 6.3  NEUTROABS 4.7 4.0  HGB 12.3 10.9*  HCT 38.9 34.3*  MCV 84.4 83.7  PLT 205 354   Basic Metabolic Panel Recent Labs    01/04/22 0316 01/05/22 1011 01/12/2022 0130  NA 138 138 137  K  4.1 3.5 3.9  CL 101 97* 97*  CO2 27 32 32  GLUCOSE 83 127* 117*  BUN 29* 25* 21  CREATININE 1.58* 1.53* 1.37*  CALCIUM 8.9 9.3 9.1  MG 1.7  --   --   PHOS 3.6  --   --    Liver Function Tests Recent Labs    12/27/2021 1913 01/04/22 0316  AST 19 19  ALT 18 19  ALKPHOS 88 92  BILITOT 0.4 0.6  PROT 5.4* 5.8*  ALBUMIN 3.2* 3.5   No results for input(s): LIPASE, AMYLASE in the last 72 hours. Cardiac Enzymes No results for input(s): CKTOTAL, CKMB, CKMBINDEX, TROPONINI in the last 72 hours.  BNP: BNP (last 3 results) Recent Labs    12/11/2021 1737  BNP 2,812.3*    ProBNP (last 3 results) No results for input(s): PROBNP in the last 8760 hours.   D-Dimer No results for input(s): DDIMER in the last 72 hours. Hemoglobin A1C No results for input(s): HGBA1C in the last 72 hours. Fasting Lipid Panel No results for input(s): CHOL, HDL, LDLCALC, TRIG, CHOLHDL, LDLDIRECT in the last 72 hours. Thyroid Function Tests Recent Labs    12/11/2021 1817  TSH 2.271    Other results:   Imaging    DG Chest Port 1 View  Addendum Date: 01/05/2022   ADDENDUM REPORT:  01/05/2022 19:29 ADDENDUM: There is a right upper extremity PICC line terminating in the mid SVC. Electronically Signed   By: Ronney Asters M.D.   On: 01/05/2022 19:29   Result Date: 01/05/2022 CLINICAL DATA:  New right-sided PICC line. EXAM: PORTABLE CHEST 1 VIEW COMPARISON:  Chest x-ray 01/02/2022. FINDINGS: There stable moderate left and small right pleural effusions. Central pulmonary vascular congestion persists. The heart is enlarged, unchanged. There is no pneumothorax or acute fracture. Thoracolumbar fusion hardware is partially visualized. IMPRESSION: 1. Stable bilateral pleural effusions. 2. Stable cardiomegaly with central pulmonary vascular congestion. Electronically Signed: By: Ronney Asters M.D. On: 01/05/2022 18:32   [place] EKG SITE RITE  Result Date: 01/05/2022 If Site Rite image not attached, placement could not be  confirmed due to current cardiac rhythm.    Medications:     Scheduled Medications:  buPROPion  300 mg Oral Daily   Chlorhexidine Gluconate Cloth  6 each Topical Daily   colestipol  1 g Oral BID   ezetimibe  10 mg Oral Daily   ferrous sulfate  325 mg Oral Q breakfast   fluticasone  1 spray Each Nare BID   hydroxychloroquine  400 mg Oral QHS   levothyroxine  25 mcg Oral QAC breakfast   loratadine  10 mg Oral Daily   montelukast  10 mg Oral Daily   pantoprazole  40 mg Oral QHS   sodium chloride flush  10-40 mL Intracatheter Q12H    Infusions:  amiodarone 30 mg/hr (01/08/2022 0706)   heparin 800 Units/hr (01/30/2022 0452)    PRN Medications: acetaminophen **OR** acetaminophen, albuterol, guaiFENesin-dextromethorphan, ipratropium, melatonin, sodium chloride flush, traZODone    Patient Profile   Ms Rowzee is a 81 year old with a history of PAF, moderate AS, severe MR, COPD, quit smoking 25 years ago, PAD, HTN, hyperlipidemia, CVA, NICM, tobacco abuse, ischemic bowel with resection in 2017,11/04/22 left ureteral calculus S/P utreteroscopy and stent,  and chronic HFrEF.     Admitted A/C HFrEF and A fib RVR.   Assessment/Plan   A/C HFrEF -Had LHC in 2017 minimal disease.  -Echo this admit EF down to 25% from previous Echo EF 40-45% in 2018 .  BNP on admit > 2800.  - CO-OX 72%. No need for inotropes.  - Set up CVP  - Volume status ok. Hold diuretics.   - No room for GDMT with low bp.  -Stop metoprolol tartrate ->reduced EF and suspected low output.  -Follow closely.  -Renal function ok.  - Set up RHC/LHC tomorrow.    2. Severe MR  -12/28/21 TEE with severe MR -Structural heart has consulted    3. PAF--> In A fib RVR - Last dose of eliquis 12/26/2021 (morning)  2/1 metoprolol tartrate stopped  2/1 started on amio drip for rate control.  - Remains tachy but rate now < 120s. Continue  amio drip for rate control - Continue  heparin drip now.     4. AKI  -Recently had  diuretics increased on 1/5. Creatinine on that time was 1.2.  -On admit creatinine was 1.7, today 1.4 - Check BMET in am.    5. A/C Respiratory Failure -H/O COPD with triple inhalers and recently placed on oxygen 12/17/21.  - at home on triple inhalers.  --No wheeze on exam  - Continue oxygen    6. Anemia  Hgb 12. Iron Sats 7%.   7. Hypothyroid -TSH ok on admit  -On levothyroxine.   Will discuss timing of procedures with Dr  Tawana Pasch. Will need TEE/DC-CV.      Length of Stay: 3  Amy Clegg, NP  01/28/2022, 7:19 AM  Advanced Heart Failure Team Pager [phone] (M-F; 7a - 5p)  Please contact [facility] for night-coverage after hours (5p -7a ) and weekends on [email or web address]  Patient seen and examined with the above-signed Advanced Practice Provider and/or Housestaff. I personally reviewed laboratory data, imaging studies and relevant notes. I independently examined the patient and formulated the important aspects of the plan. I have edited the note to reflect any of my changes or salient points. I have personally discussed the plan with the patient and/or family.  Remains in AF with RVR on IV amio. Still feels weak. Breathing improved.   CVP 2-3 (checked personally) Co-ox 72%   General:  Elderly weak appearing. No resp difficulty HEENT: normal Neck: supple. no JVD. Carotids 2+ bilat; no bruits. No lymphadenopathy or thryomegaly appreciated. Cor: PMI nondisplaced. Irregular tachy soft MR Lungs: clear Abdomen: soft, nontender, nondistended. No hepatosplenomegaly. No bruits or masses. Good bowel sounds. Extremities: no cyanosis, clubbing, rash, edema Neuro: alert & orientedx3, cranial nerves grossly intact. moves all 4 extremities w/o difficulty. Affect pleasant  She remains tenuous but co-ox and volume status look good. Remains in AF with RVR.  Based on echo in 12/22 LV dysfunction preceded recurrence of AF so doubt this is simply tachy-CM. Will plan R/L cath later today followed  by possible TEE/DC-CV tomorrow. If not improving with restoration of NSR and GDMT will need to consider MitraClip. Structural team on board. Appreciate input. Continue heparin and IV amio. Mobilize with PT/OT.   Glori Bickers, MD  9:24 AM

## 2022-01-06 NOTE — Interval H&P Note (Signed)
History and Physical Interval Note:  01/08/2022 1:02 PM  [name]  has presented today for surgery, with the diagnosis of CHEST PAIN.  The various methods of treatment have been discussed with the patient and family. After consideration of risks, benefits and other options for treatment, the patient has consented to  Procedure(s): RIGHT/LEFT HEART CATH AND CORONARY ANGIOGRAPHY (N/A) and possible coronary angioplasty as a surgical intervention.  The patient's history has been reviewed, patient examined, no change in status, stable for surgery.  I have reviewed the patient's chart and labs.  Questions were answered to the patient's satisfaction.     Glora Hulgan

## 2022-01-07 ENCOUNTER — Encounter (HOSPITAL_COMMUNITY): Admission: EM | Disposition: E | Payer: Self-pay | Source: Home / Self Care | Attending: Family Medicine

## 2022-01-07 ENCOUNTER — Inpatient Hospital Stay (HOSPITAL_COMMUNITY): Payer: Medicare Other | Admitting: Anesthesiology

## 2022-01-07 ENCOUNTER — Encounter (HOSPITAL_COMMUNITY): Payer: Self-pay | Admitting: Internal Medicine

## 2022-01-07 ENCOUNTER — Inpatient Hospital Stay (HOSPITAL_COMMUNITY): Payer: Medicare Other

## 2022-01-07 DIAGNOSIS — I340 Nonrheumatic mitral (valve) insufficiency: Secondary | ICD-10-CM

## 2022-01-07 DIAGNOSIS — I130 Hypertensive heart and chronic kidney disease with heart failure and stage 1 through stage 4 chronic kidney disease, or unspecified chronic kidney disease: Secondary | ICD-10-CM | POA: Diagnosis not present

## 2022-01-07 DIAGNOSIS — Z515 Encounter for palliative care: Secondary | ICD-10-CM | POA: Diagnosis not present

## 2022-01-07 DIAGNOSIS — Z006 Encounter for examination for normal comparison and control in clinical research program: Secondary | ICD-10-CM | POA: Diagnosis not present

## 2022-01-07 DIAGNOSIS — I5043 Acute on chronic combined systolic (congestive) and diastolic (congestive) heart failure: Secondary | ICD-10-CM | POA: Diagnosis not present

## 2022-01-07 DIAGNOSIS — I480 Paroxysmal atrial fibrillation: Secondary | ICD-10-CM | POA: Diagnosis not present

## 2022-01-07 DIAGNOSIS — Z66 Do not resuscitate: Secondary | ICD-10-CM | POA: Diagnosis not present

## 2022-01-07 DIAGNOSIS — N179 Acute kidney failure, unspecified: Secondary | ICD-10-CM | POA: Diagnosis not present

## 2022-01-07 HISTORY — PX: CARDIOVERSION: SHX1299

## 2022-01-07 HISTORY — PX: TEE WITHOUT CARDIOVERSION: SHX5443

## 2022-01-07 LAB — BASIC METABOLIC PANEL
Anion gap: 9 (ref 5–15)
BUN: 17 mg/dL (ref 8–23)
CO2: 29 mmol/L (ref 22–32)
Calcium: 9.2 mg/dL (ref 8.9–10.3)
Chloride: 98 mmol/L (ref 98–111)
Creatinine, Ser: 1.38 mg/dL — ABNORMAL HIGH (ref 0.44–1.00)
GFR, Estimated: 38 mL/min — ABNORMAL LOW (ref >60)
Glucose, Bld: 119 mg/dL — ABNORMAL HIGH (ref 70–99)
Potassium: 3.9 mmol/L (ref 3.5–5.1)
Sodium: 136 mmol/L (ref 135–145)

## 2022-01-07 LAB — HEPARIN LEVEL (UNFRACTIONATED)
Heparin Unfractionated: 0.19 IU/mL — ABNORMAL LOW (ref 0.30–0.70)
Heparin Unfractionated: 0.24 IU/mL — ABNORMAL LOW (ref 0.30–0.70)
Heparin Unfractionated: 0.11 IU/mL — ABNORMAL LOW (ref 0.30–0.70)

## 2022-01-07 LAB — CBC WITH DIFFERENTIAL/PLATELET
Abs Immature Granulocytes: 0.02 10*3/uL (ref 0.00–0.07)
Basophils Absolute: 0 10*3/uL (ref 0.0–0.1)
Basophils Relative: 0 %
Eosinophils Absolute: 0 10*3/uL (ref 0.0–0.5)
Eosinophils Relative: 0 %
HCT: 34.2 % — ABNORMAL LOW (ref 36.0–46.0)
Hemoglobin: 10.8 g/dL — ABNORMAL LOW (ref 12.0–15.0)
Immature Granulocytes: 0 %
Lymphocytes Relative: 7 %
Lymphs Abs: 0.4 10*3/uL — ABNORMAL LOW (ref 0.7–4.0)
MCH: 26.5 pg (ref 26.0–34.0)
MCHC: 31.6 g/dL (ref 30.0–36.0)
MCV: 84 fL (ref 80.0–100.0)
Monocytes Absolute: 0.7 10*3/uL (ref 0.1–1.0)
Monocytes Relative: 14 %
Neutro Abs: 3.9 10*3/uL (ref 1.7–7.7)
Neutrophils Relative %: 79 %
Platelets: 163 10*3/uL (ref 150–400)
RBC: 4.07 MIL/uL (ref 3.87–5.11)
RDW: 16.5 % — ABNORMAL HIGH (ref 11.5–15.5)
WBC: 5 10*3/uL (ref 4.0–10.5)
nRBC: 0 % (ref 0.0–0.2)

## 2022-01-07 LAB — COOXEMETRY PANEL
Carboxyhemoglobin: 0.9 % (ref 0.5–1.5)
Methemoglobin: 1.4 % (ref 0.0–1.5)
O2 Saturation: 45.4 %
Total hemoglobin: 10.3 g/dL — ABNORMAL LOW (ref 12.0–16.0)

## 2022-01-07 SURGERY — ECHOCARDIOGRAM, TRANSESOPHAGEAL
Anesthesia: General

## 2022-01-07 MED ORDER — CALCIUM GLUCONATE-NACL 1-0.675 GM/50ML-% IV SOLN
1.0000 g | Freq: Once | INTRAVENOUS | Status: AC
  Administered 2022-01-07: 1000 mg via INTRAVENOUS
  Filled 2022-01-07: qty 50

## 2022-01-07 MED ORDER — PROPOFOL 500 MG/50ML IV EMUL
INTRAVENOUS | Status: DC | PRN
  Administered 2022-01-07: 50 ug/kg/min via INTRAVENOUS

## 2022-01-07 MED ORDER — LIDOCAINE 2% (20 MG/ML) 5 ML SYRINGE
INTRAMUSCULAR | Status: DC | PRN
  Administered 2022-01-07: 80 mg via INTRAVENOUS

## 2022-01-07 MED ORDER — FUROSEMIDE 10 MG/ML IJ SOLN
80.0000 mg | Freq: Every day | INTRAMUSCULAR | Status: DC
  Administered 2022-01-07 – 2022-01-12 (×6): 80 mg via INTRAVENOUS
  Filled 2022-01-07 (×7): qty 8

## 2022-01-07 MED ORDER — LEVALBUTEROL HCL 0.63 MG/3ML IN NEBU
0.6300 mg | INHALATION_SOLUTION | Freq: Four times a day (QID) | RESPIRATORY_TRACT | Status: DC | PRN
  Administered 2022-01-07 – 2022-01-19 (×10): 0.63 mg via RESPIRATORY_TRACT
  Filled 2022-01-07 (×11): qty 3

## 2022-01-07 MED ORDER — PHENYLEPHRINE 40 MCG/ML (10ML) SYRINGE FOR IV PUSH (FOR BLOOD PRESSURE SUPPORT)
PREFILLED_SYRINGE | INTRAVENOUS | Status: DC | PRN
  Administered 2022-01-07: 120 ug via INTRAVENOUS
  Administered 2022-01-07: 160 ug via INTRAVENOUS
  Administered 2022-01-07: 80 ug via INTRAVENOUS

## 2022-01-07 MED ORDER — POTASSIUM CHLORIDE CRYS ER 20 MEQ PO TBCR
20.0000 meq | EXTENDED_RELEASE_TABLET | Freq: Once | ORAL | Status: AC
  Administered 2022-01-07: 20 meq via ORAL
  Filled 2022-01-07 (×2): qty 1

## 2022-01-07 MED ORDER — PROPOFOL 10 MG/ML IV BOLUS
INTRAVENOUS | Status: DC | PRN
  Administered 2022-01-07: 30 mg via INTRAVENOUS

## 2022-01-07 MED ORDER — FUROSEMIDE 40 MG PO TABS: 40.0000 mg | ORAL_TABLET | Freq: Every day | ORAL | Status: DC

## 2022-01-07 NOTE — Progress Notes (Signed)
ANTICOAGULATION CONSULT NOTE - Follow Up Consult  Pharmacy Consult for heparin Indication: atrial fibrillation  Labs: Recent Labs    01/05/22 1011 01/16/2022 0130 01/30/2022 1145 01/28/2022 1351 01/30/2022 1355 01/25/2022 0500 01/29/2022 1055 01/25/2022 2230  HGB 12.3 10.9*  --  10.9* 11.6*   10.5* 10.8*  --   --   HCT 38.9 34.3*  --  32.0* 34.0*   31.0* 34.2*  --   --   PLT 205 178  --   --   --  163  --   --   APTT  --  31  --   --   --   --   --   --   HEPARINUNFRC  --  <0.10*   < >  --   --  0.19* 0.24* 0.11*  CREATININE 1.53* 1.37*  --   --   --  1.38*  --   --    < > = values in this interval not displayed.    Assessment: 81yo female subtherapeutic on heparin with lower heparin level despite increased rate; no infusion issues or signs of bleeding per RN.  Goal of Therapy:  Heparin level 0.3-0.7 units/ml   Plan:  Will increase heparin infusion by 4 units/kg/hr to 1100 units/hr and check level in 8 hours.    Wynona Neat, PharmD, BCPS  01/30/2022,11:24 PM

## 2022-01-07 NOTE — Progress Notes (Signed)
Structural HD Progress Note  Patient Name: [name] Date of Encounter: 01/16/2022  [facility] Cardiologist: Will Meredith Leeds, MD   Subjective   Patient remains very tired and complains of poor sleep; unable to take trazodone etc due to low BPs.  Breathing somewhat better.  Remains in AF/RVR.  Underwent R/LHD/cor angio yesterday demonstrating elevated V waves, severe LV dysfunction, and reasonable PA sat/CO/CI with minimal CAD.    Inpatient Medications    Scheduled Meds:  buPROPion  300 mg Oral Daily   Chlorhexidine Gluconate Cloth  6 each Topical Daily   colestipol  1 g Oral BID   ezetimibe  10 mg Oral Daily   ferrous sulfate  325 mg Oral Q breakfast   fluticasone  1 spray Each Nare BID   hydroxychloroquine  400 mg Oral QHS   levothyroxine  25 mcg Oral QAC breakfast   loratadine  10 mg Oral Daily   montelukast  10 mg Oral Daily   pantoprazole  40 mg Oral QHS   sodium chloride flush  10-40 mL Intracatheter Q12H   sodium chloride flush  3 mL Intravenous Q12H   sodium chloride flush  3 mL Intravenous Q12H   Continuous Infusions:  sodium chloride     amiodarone 30 mg/hr (02/01/2022 1908)   heparin 800 Units/hr (01/29/2022 2226)   PRN Meds: sodium chloride, acetaminophen, albuterol, guaiFENesin-dextromethorphan, ipratropium, melatonin, ondansetron (ZOFRAN) IV, sodium chloride flush, sodium chloride flush, traZODone   Vital Signs    Vitals:   01/12/2022 1940 01/05/2022 2100 01/24/2022 2303 01/11/2022 0322  BP: 100/67  (!) 89/69 93/69  Pulse: (!) 124  (!) 140 (!) 119  Resp: 16  20 18   Temp: 97.9 F (36.6 C)  97.7 F (36.5 C) 97.7 F (36.5 C)  TempSrc: Oral  Oral Oral  SpO2: 95% 95% 94% 93%  Weight:      Height:        Intake/Output Summary (Last 24 hours) at 01/16/2022 0650 Last data filed at 01/24/2022 1324 Gross per 24 hour  Intake 240 ml  Output 2 ml  Net 238 ml   Last 3 Weights 01/23/2022 01/23/2022 01/05/2022  Weight (lbs) 115 lb 1.3 oz 115 lb 1.3 oz 115 lb  1.3 oz  Weight (kg) 52.2 kg 52.2 kg 52.2 kg      Telemetry    AF RVR - Personally Reviewed  ECG    AF + IVCD - Personally Reviewed  Physical Exam   GEN: No acute distress, elderly Neck: No JVD Cardiac: Irregular, tachycardic,  Respiratory: Coarse  GI: Soft, nontender, non-distended  MS: No edema; No deformity. Neuro:  Nonfocal  Psych: Normal affect   Labs    High Sensitivity Troponin:   Recent Labs  Lab 12/24/2021 1913 12/15/2021 2113  TROPONINIHS 13 13     Chemistry Recent Labs  Lab 12/11/2021 1913 01/04/22 0316 01/05/22 1011 01/21/2022 0130 01/26/2022 1351 01/27/2022 1355 01/09/2022 0500  NA 135 138 138 137 137 138   141 136  K 4.1 4.1 3.5 3.9 3.6 3.6   3.2* 3.9  CL 98 101 97* 97*  --   --  98  CO2 27 27 32 32  --   --  29  GLUCOSE 94 83 127* 117*  --   --  119*  BUN 30* 29* 25* 21  --   --  17  CREATININE 1.73* 1.58* 1.53* 1.37*  --   --  1.38*  CALCIUM 8.8* 8.9 9.3 9.1  --   --  9.2  MG  --  1.7  --  2.3  --   --   --   PROT 5.4* 5.8*  --   --   --   --   --   ALBUMIN 3.2* 3.5  --   --   --   --   --   AST 19 19  --   --   --   --   --   ALT 18 19  --   --   --   --   --   ALKPHOS 88 92  --   --   --   --   --   BILITOT 0.4 0.6  --   --   --   --   --   GFRNONAA 29* 32* 34* 39*  --   --  38*  ANIONGAP 10 10 9 8   --   --  9    Lipids No results for input(s): CHOL, TRIG, HDL, LABVLDL, LDLCALC, CHOLHDL in the last 168 hours.  Hematology Recent Labs  Lab 01/05/22 1011 01/10/2022 0130 01/09/2022 1351 01/05/2022 1355 01/10/2022 0500  WBC 7.2 6.3  --   --  5.0  RBC 4.61 4.10  --   --  4.07  HGB 12.3 10.9* 10.9* 11.6*   10.5* 10.8*  HCT 38.9 34.3* 32.0* 34.0*   31.0* 34.2*  MCV 84.4 83.7  --   --  84.0  MCH 26.7 26.6  --   --  26.5  MCHC 31.6 31.8  --   --  31.6  RDW 16.3* 16.3*  --   --  16.5*  PLT 205 178  --   --  163   Thyroid  Recent Labs  Lab 12/07/2021 1817  TSH 2.271    BNP Recent Labs  Lab 12/08/2021 1737  BNP 2,812.3*    DDimer No results for  input(s): DDIMER in the last 168 hours.     Cardiac Studies     Prox RCA to Mid RCA lesion is 50% stenosed.   Prox LAD to Mid LAD lesion is 20% stenosed.   Ost Cx to Prox Cx lesion is 20% stenosed.   The left ventricular ejection fraction is less than 25% by visual estimate.   Findings:   Ao  = 97/64 (77) LV = 100/14 RA = 5 RV = 41/4 PA = 46/22 (34) PCW = 24 (intermittent v waves to 50) Fick cardiac output/index = 4.2/2.7 PVR = 1.7 WU Ao sat = 99% PA sat = 64%, 66% PAPi = 4.8   Assessment: 1. Minimal non-obstructive CAD 2. Severe NICM EF ~20% 3. Elevated PCWP with intermittent v-waves to 50 suggestive of severe MR  Assessment & Plan    Acute on chronic systolic heart failure:  Managed by HF team.  Relatively low BP not allowing for GDMT.  TEE cardioversion today and if holds SR, hopefully some degree of GDMT can be added. Atrial fibrillation:  On hep and amio gtts.  TEE CV today.  If able to stay in NSR, then GDMT, discharge and consider outpatient mTEER.  If unable to stay in NSR, then will likely need mTEER this admission (if so would be Thursday) NICM:  See discussion above.  GDMT limited by marginal BP. Severe functional MR:  Reviewed with structural imagers.  Relatively high MV gradient due to severe MR and tachycardia.  Anatomy reasonable for mTEER.  Given low functional capacity at baseline, would accept higher post mTEER gradient for  significant MR reduction. COPD:  Cont nebulizer treatments. AKI:  Improved, Cr now 1.38.      For questions or updates, please contact [place] Please consult [email or web address] for contact info under        Signed, Early Osmond, MD  01/09/2022, 6:50 AM

## 2022-01-07 NOTE — Evaluation (Addendum)
Physical Therapy Evaluation Patient Details Name: [name] MRN: [id number] DOB: 05/21/1941 Today's Date: 01/12/2022  History of Present Illness  Patient is a 81 y/o female who presents on 12/31/2021 with weakness, fluttering in chest and SOB. CXR- bil pulmonary edema. Found to have acute on chronic CHF, A-fib with RVR and severe mitral regurgitation. PMH includes PAF, moderate AS, COPD, quit smoking 25 years ago, PAD, HTN, CVA, NICM, tobacco abuse, ischemic bowel with resection in 2017,11/04/22 left ureteral calculus S/P utreteroscopy and stent and chronic HFrEF.  Clinical Impression  Pt presents to PT with decr mobility due to decr balance, decr strength and decr functional activity tolerance. Pt currently with afib with rvr with HR 118-130 during session so amb limited to short distance in room. Expect as cardiac issues are treated pt's mobility will continue to improve. Pt with very supportive spouse at home and expect she will be able to return there at DC. If progresses well doubt she will need HHPT.        Recommendations for follow up therapy are one component of a multi-disciplinary discharge planning process, led by the attending physician.  Recommendations may be updated based on patient status, additional functional criteria and insurance authorization.  Follow Up Recommendations No PT follow up    Assistance Recommended at Discharge Intermittent Supervision/Assistance  Patient can return home with the following  Help with stairs or ramp for entrance;A little help with walking and/or transfers    Equipment Recommendations Other (comment) (To be determined)  Recommendations for Other Services       Functional Status Assessment Patient has had a recent decline in their functional status and demonstrates the ability to make significant improvements in function in a reasonable and predictable amount of time.     Precautions / Restrictions        Mobility  Bed Mobility                General bed mobility comments: Pt up on EOB    Transfers Overall transfer level: Needs assistance Equipment used: None Transfers: Sit to/from Stand Sit to Stand: Min guard           General transfer comment: Assist for safety/lines    Ambulation/Gait Ambulation/Gait assistance: Min assist Gait Distance (Feet): 12 Feet Assistive device: 1 person hand held assist Gait Pattern/deviations: Step-through pattern, Decreased stride length Gait velocity: decr Gait velocity interpretation: 1.31 - 2.62 ft/sec, indicative of limited community ambulator   General Gait Details: Assist for balance. Limited distance due to high HR  Stairs            Wheelchair Mobility    Modified Rankin (Stroke Patients Only)       Balance Overall balance assessment: Needs assistance Sitting-balance support: No upper extremity supported, Feet supported Sitting balance-[name]: Good     Standing balance support: No upper extremity supported Standing balance-[name]: Fair                               Pertinent Vitals/Pain Pain Assessment Pain Assessment: No/denies pain    Home Living Family/patient expects to be discharged to:: Private residence Living Arrangements: Spouse/significant other Available Help at Discharge: Family;Available 24 hours/day Type of Home: House Home Access: Stairs to enter Entrance Stairs-Rails: Right Entrance Stairs-Number of Steps: 5   Home Layout: One level Home Equipment: Cane - single point      Prior Function Prior Level of Function :  Independent/Modified Independent             Mobility Comments: Using cane recently       Hand Dominance   Dominant Hand: Right    Extremity/Trunk Assessment   Upper Extremity Assessment Upper Extremity Assessment: Generalized weakness    Lower Extremity Assessment Lower Extremity Assessment: Generalized weakness       Communication   Communication: No  difficulties  Cognition Arousal/Alertness: Awake/alert Behavior During Therapy: WFL for tasks assessed/performed Overall Cognitive Status: Within Functional Limits for tasks assessed                                          General Comments General comments (skin integrity, edema, etc.): HR 118-130 at rest and with mobility    Exercises     Assessment/Plan    PT Assessment Patient needs continued PT services  PT Problem List Decreased strength;Decreased activity tolerance;Decreased balance;Decreased mobility;Cardiopulmonary status limiting activity       PT Treatment Interventions DME instruction;Gait training;Stair training;Functional mobility training;Therapeutic activities;Therapeutic exercise;Balance training;Patient/family education    PT Goals (Current goals can be found in the Care Plan section)  Acute Rehab PT Goals Patient Stated Goal: return home PT Goal Formulation: With patient Time For Goal Achievement: 01/14/22 Potential to Achieve Goals: Good    Frequency Min 3X/week     Co-evaluation               AM-PAC PT "6 Clicks" Mobility  Outcome Measure Help needed turning from your back to your side while in a flat bed without using bedrails?: None Help needed moving from lying on your back to sitting on the side of a flat bed without using bedrails?: None Help needed moving to and from a bed to a chair (including a wheelchair)?: A Little Help needed standing up from a chair using your arms (e.g., wheelchair or bedside chair)?: A Little Help needed to walk in hospital room?: A Little Help needed climbing 3-5 steps with a railing? : A Little 6 Click Score: 20    End of Session   Activity Tolerance: Treatment limited secondary to medical complications (Comment) (high HR) Patient left: in chair;with call bell/phone within reach;with family/visitor present Nurse Communication: Mobility status PT Visit Diagnosis: Unsteadiness on feet  (R26.81);Muscle weakness (generalized) (M62.81)    Time: 6606-3016 PT Time Calculation (min) (ACUTE ONLY): 12 min   Charges:   PT Evaluation $PT Eval Moderate Complexity: [place] Pager [phone] Office [place] 01/05/2022, 12:58 PM

## 2022-01-07 NOTE — Progress Notes (Signed)
°  Echocardiogram Echocardiogram Transesophageal has been performed.  Darlina Sicilian M 01/30/2022, 2:19 PM

## 2022-01-07 NOTE — Anesthesia Preprocedure Evaluation (Addendum)
Anesthesia Evaluation  Patient identified by MRN, date of birth, ID band Patient awake    Reviewed: Allergy & Precautions, NPO status , Patient's Chart, lab work & pertinent test results, reviewed documented beta blocker date and time   Airway Mallampati: II  TM Distance: >3 FB Neck ROM: Full    Dental  (+) Upper Dentures, Partial Lower   Pulmonary COPD (3L ATC),  oxygen dependent, former smoker,    breath sounds clear to auscultation       Cardiovascular hypertension, Pt. on medications and Pt. on home beta blockers + CAD and +CHF  Normal cardiovascular exam+ dysrhythmias Atrial Fibrillation  Rhythm:Irregular Rate:Normal  Assessment: 1. Minimal non-obstructive CAD 2. Severe NICM EF ~20% 3. Elevated PCWP with intermittent v-waves to 50 suggestive of severe MR  IMPRESSIONS    1. Left ventricular ejection fraction, by estimation, is 25 to 30%. The  left ventricle has severely decreased function. The left ventricle  demonstrates global hypokinesis.  2. Right ventricular systolic function is normal. The right ventricular  size is normal.  3. No left atrial/left atrial appendage thrombus was detected.  4. The mitral valve is grossly normal but does not coapt completely.  Severe mitral valve regurgitation. 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 Systolic flow reversal appreciated in  left upper pulmonary vein. Mild mitral  stenosis. The mean mitral valve gradient is 5.0 mmHg.  5. Tricuspid valve regurgitation is mild to moderate.  6. The aortic valve is tricuspid. Aortic valve regurgitation is not  visualized. Mild to moderate aortic valve stenosis. Aortic valve area, by  VTI measures 1.28 cm. Aortic valve mean gradient measures 7.5 mmHg.  Aortic valve Vmax measures 2.05 m/s.  7. Shunting seen with color flow left to right. Agitated saline contrast  bubble study was negative, with no  evidence of any interatrial shunt.  There is a patent foramen ovale with predominantly left to right shunting  across the atrial septum.    Neuro/Psych Anxiety Depression TIACVA (2001, leg weakness), Residual Symptoms    GI/Hepatic Neg liver ROS, hiatal hernia, GERD  ,  Endo/Other  negative endocrine ROS  Renal/GU CRFRenal disease  negative genitourinary   Musculoskeletal  (+) Arthritis , Osteoarthritis,    Abdominal   Peds  Hematology negative hematology ROS (+)   Anesthesia Other Findings   Reproductive/Obstetrics                            Anesthesia Physical  Anesthesia Plan  ASA: 4  Anesthesia Plan: General   Post-op Pain Management: Minimal or no pain anticipated   Induction: Intravenous  PONV Risk Score and Plan: 2 and Propofol infusion and Treatment may vary due to age or medical condition  Airway Management Planned: Mask  Additional Equipment: None  Intra-op Plan:   Post-operative Plan:   Informed Consent: I have reviewed the patients History and Physical, chart, labs and discussed the procedure including the risks, benefits and alternatives for the proposed anesthesia with the patient or authorized representative who has indicated his/her understanding and acceptance.     Dental advisory given  Plan Discussed with: CRNA  Anesthesia Plan Comments: (Lab Results      Component                Value               Date  WBC                      4.9                 10/26/2021                HGB                      12.0                10/26/2021                HCT                      37.9                10/26/2021                MCV                      84.4                10/26/2021                PLT                      164                 10/26/2021           Lab Results      Component                Value               Date                      NA                       141                 12/17/2021                 K                        3.8                 12/17/2021                CO2                      28                  12/17/2021                GLUCOSE                  80                  12/17/2021                BUN                      31 (H)              12/17/2021  CREATININE               1.66 (H)            12/17/2021                CALCIUM                  8.9                 12/17/2021                GFRNONAA                 47 (L)              10/26/2021            ECHO 12/22: 1. Left ventricular ejection fraction, by estimation, is 25 to 30%. The  left ventricle has severely decreased function. The left ventricle  demonstrates global hypokinesis. Left ventricular diastolic parameters are  consistent with Grade II diastolic  dysfunction (pseudonormalization). Elevated left ventricular end-diastolic  pressure.  2. Right ventricular systolic function is normal. The right ventricular  size is normal. There is normal pulmonary artery systolic pressure.  3. Left atrial size was severely dilated.  4. The mitral valve is abnormal. Moderate to severe mitral valve  regurgitation. Mild mitral stenosis. The mean mitral valve gradient is 5.0  mmHg with average heart rate of 70 bpm.  5. The aortic valve is tricuspid. There is mild calcification of the  aortic valve. There is mild thickening of the aortic valve. Aortic valve  regurgitation is not visualized. Moderate aortic valve stenosis. Aortic  valve area, by VTI measures 1.03 cm.  Aortic valve mean gradient measures 10.5 mmHg.  6. The inferior vena cava is normal in size with greater than 50%  respiratory variability, suggesting right atrial pressure of 3 mmHg. )       Anesthesia Quick Evaluation

## 2022-01-07 NOTE — Progress Notes (Signed)
HPI: [name] is a 81 y.o. female with medical history significant for chronic systolic/diastolic heart failure, with most recent LVEF 25 to 30%, paroxysmal atrial fibrillation complicated by sick sinus syndrome, COPD, hyperlipidemia, anemia of chronic disease, who is admitted to [facility] on 01/02/2022 with acute on chronic systolic/diastolic heart failure after presenting from home to [facility] ED complaining of shortness of breath.   The patient reports 4 to 5 days of progressive shortness of breath associated orthopnea and worsening of edema in the bilateral lower extremities.  She reports associated nonproductive cough in the absence of any associated subjective fever, chills, rigors, or generalized myalgias.  Denies any associated chest pain, palpitations, diaphoresis, nausea, vomiting, dizziness, presyncope, or syncope.  Denies any new onset calf tenderness or new lower extremity erythema.  No hemoptysis.  Also denies any recent melena or hematochezia.  No recent rhinitis, rhinorrhea, sore throat, wheezing, rash.  She reports good compliance with her outpatient diuretic regimen which consists of Lasix 40 mg p.o. daily.  She confirms a history of chronic systolic/diastolic heart failure, with most recent echocardiogram from 11/26/2021 notable for LVEF 25 to 30% with global left ventricular hypokinesis, grade 2 diastolic dysfunction.  Per chart review, it appears that the nature of her chronic heart failure is felt to be on the basis of nonischemic cardiomyopathy.  She also has a history of COPD acknowledging that she is a former smoker, and completely quit smoking approximately 25 years ago.  She follows with pulmonology as an outpatient, with recent associated appointment revealing pulmonology's opinion that her progressive shortness of breath is more cardiac than pulmonary in primary origin at this point.   Her diuresis as an outpatient has been complicated by intermittent hypotension.   Additionally, per chart review, her baseline serum creatinine appears to be 1.2-1.3, with most recent prior value noted to be 1.17 on 12/09/2021.   ED Course:  Vital signs in the ED were notable for the following: Afebrile; heart rate 93-1 05; blood pressure 87/62 - 106/82, respiratory rate 14-24, oxygen saturation 97 to 100% on room air.  Labs were notable for the following: CMP was notable for the following: Sodium 135, potassium 4.1, creatinine 1.73.  High-sensitivity troponin I x2 values were both found to be 13.  BNP 2800 compared to most recent prior value of 340 in 2017.  CBC notable for episode count 5200, hemoglobin 10.8 relative most recent prior value of 12 in April 2022.  Urinalysis showed no white blood cells, no red blood cells, and was negative for protein.  COVID-19/influenza PCR found to be negative.  Imaging and additional notable ED work-up: EKG shows atrial fibrillation with interventricular conduction delay, heart rate 108, no evidence of T wave or ST changes, including no evidence of ST elevation.  Chest x-ray, in comparison to most recent prior chest x-ray from 12/09/2021 showed interval worsening of bilateral pulmonary edema as well as interval increase in size of the bilateral pleural effusions, while demonstrating no evidence of infiltrate or pneumothorax.  EDP discussed the patient's case with on-call cardiology, who will formally consult, with additional recommendations pending at this time.  While in the ED, the following were administered: Lasix 80 mg IV x1.  Subsequently, the patient was admitted to the PCU for further evaluation management of presenting acute on chronic systolic/diastolic heart failure complicated by borderline hypotension as well as acute kidney injury.  Subjective Cannot sleep lying flat very tired.  Still short of breath.  Objective  Physical Exam: Vitals:   01/17/2022 2303 01/12/2022 0322 01/16/2022 0500 01/05/2022 0741  BP: (!) 89/69 93/69  (!)  92/58  Pulse: (!) 140 (!) 119  (!) 120  Resp: 20 18  20   Temp: 97.7 F (36.5 C) 97.7 F (36.5 C)  (!) 97.5 F (36.4 C)  TempSrc: Oral Oral  Oral  SpO2: 94% 93%  96%  Weight:   53.6 kg   Height:        General: appears to be stated age; alert, oriented; mildly increased work of breathing noted Skin: warm, dry, no rash Head:  AT/[place] Mouth:  Oral mucosa membranes appear moist, normal dentition Neck: supple; trachea midline Heart:  RRR; did not appreciate any M/R/G Lungs: Bibasilar crackles noted, but otherwise CTAB, did not appreciate any wheezes,or rhonchi Abdomen: + BS; soft, ND, NT Vascular: 2+ pedal pulses b/l; 2+ radial pulses b/l Extremities: 1-2+ edema in the bilateral lower extremities, no muscle wasting Neuro: strength and sensation intact in upper and lower extremities b/l     Labs on Admission: I have personally reviewed following labs and imaging studies  CBC: Recent Labs  Lab 12/22/2021 1913 01/04/22 0316 01/05/22 1011 01/31/2022 0130 01/18/2022 1351 01/05/2022 1355 01/12/2022 0500  WBC 5.2 6.3 7.2 6.3  --   --  5.0  NEUTROABS 3.4 4.1 4.7 4.0  --   --  3.9  HGB 10.8* 11.1* 12.3 10.9* 10.9* 11.6*   10.5* 10.8*  HCT 34.8* 37.0 38.9 34.3* 32.0* 34.0*   31.0* 34.2*  MCV 85.1 85.6 84.4 83.7  --   --  84.0  PLT 154 174 205 178  --   --  161   Basic Metabolic Panel: Recent Labs  Lab 12/10/2021 1913 01/04/22 0316 01/05/22 1011 01/05/2022 0130 01/10/2022 1351 01/15/2022 1355 01/12/2022 0500  NA 135 138 138 137 137 138   141 136  K 4.1 4.1 3.5 3.9 3.6 3.6   3.2* 3.9  CL 98 101 97* 97*  --   --  98  CO2 27 27 32 32  --   --  29  GLUCOSE 94 83 127* 117*  --   --  119*  BUN 30* 29* 25* 21  --   --  17  CREATININE 1.73* 1.58* 1.53* 1.37*  --   --  1.38*  CALCIUM 8.8* 8.9 9.3 9.1  --   --  9.2  MG  --  1.7  --  2.3  --   --   --   PHOS  --  3.6  --   --   --   --   --    GFR: Estimated Creatinine Clearance: 26.6 mL/min (A) (by C-G formula based on SCr of 1.38 mg/dL  (H)). Liver Function Tests: Recent Labs  Lab 12/08/2021 1913 01/04/22 0316  AST 19 19  ALT 18 19  ALKPHOS 88 92  BILITOT 0.4 0.6  PROT 5.4* 5.8*  ALBUMIN 3.2* 3.5   No results for input(s): LIPASE, AMYLASE in the last 168 hours. No results for input(s): AMMONIA in the last 168 hours. Coagulation Profile: No results for input(s): INR, PROTIME in the last 168 hours. Cardiac Enzymes: No results for input(s): CKTOTAL, CKMB, CKMBINDEX, TROPONINI in the last 168 hours. BNP (last 3 results) No results for input(s): PROBNP in the last 8760 hours. HbA1C: No results for input(s): HGBA1C in the last 72 hours. CBG: No results for input(s): GLUCAP in the last 168 hours. Lipid Profile: No results for input(s): CHOL,  HDL, LDLCALC, TRIG, CHOLHDL, LDLDIRECT in the last 72 hours. Thyroid Function Tests: No results for input(s): TSH, T4TOTAL, FREET4, T3FREE, THYROIDAB in the last 72 hours.  Anemia Panel: No results for input(s): VITAMINB12, FOLATE, FERRITIN, TIBC, IRON, RETICCTPCT in the last 72 hours.  Urine analysis:    Component Value Date/Time   COLORURINE YELLOW 12/29/2021 2046   APPEARANCEUR CLEAR 12/05/2021 2046   LABSPEC 1.025 12/11/2021 2046   PHURINE 5.5 12/30/2021 2046   GLUCOSEU NEGATIVE 12/08/2021 2046   GLUCOSEU NEGATIVE 10/29/2019 1135   HGBUR NEGATIVE 12/05/2021 2046   BILIRUBINUR NEGATIVE 12/23/2021 2046   KETONESUR NEGATIVE 12/17/2021 2046   PROTEINUR NEGATIVE 01/04/2022 2046   UROBILINOGEN 0.2 10/29/2019 1135   NITRITE NEGATIVE 12/17/2021 2046   LEUKOCYTESUR NEGATIVE 12/26/2021 2046    Radiological Exams on Admission: CARDIAC CATHETERIZATION  Result Date: 01/25/2022   Prox RCA to Mid RCA lesion is 50% stenosed.   Prox LAD to Mid LAD lesion is 20% stenosed.   Ost Cx to Prox Cx lesion is 20% stenosed.   The left ventricular ejection fraction is less than 25% by visual estimate. Findings: Ao  = 97/64 (77) LV = 100/14 RA = 5 RV = 41/4 PA = 46/22 (34) PCW = 24  (intermittent v waves to 50) Fick cardiac output/index = 4.2/2.7 PVR = 1.7 WU Ao sat = 99% PA sat = 64%, 66% PAPi = 4.8 Assessment: 1. Minimal non-obstructive CAD 2. Severe NICM EF ~20% 3. Elevated PCWP with intermittent v-waves to 50 suggestive of severe MR Plan/Discussion: Continue medical therapy. Will plan DC-CV of AF in am. Continue amio. Structural team following for possible MitraClip. Glori Bickers, MD 5:49 PM  DG Chest Port 1 View  Addendum Date: 01/05/2022   ADDENDUM REPORT: 01/05/2022 19:29 ADDENDUM: There is a right upper extremity PICC line terminating in the mid SVC. Electronically Signed   By: Ronney Asters M.D.   On: 01/05/2022 19:29   Result Date: 01/05/2022 CLINICAL DATA:  New right-sided PICC line. EXAM: PORTABLE CHEST 1 VIEW COMPARISON:  Chest x-ray 12/31/2021. FINDINGS: There stable moderate left and small right pleural effusions. Central pulmonary vascular congestion persists. The heart is enlarged, unchanged. There is no pneumothorax or acute fracture. Thoracolumbar fusion hardware is partially visualized. IMPRESSION: 1. Stable bilateral pleural effusions. 2. Stable cardiomegaly with central pulmonary vascular congestion. Electronically Signed: By: Ronney Asters M.D. On: 01/05/2022 18:32   [place] EKG SITE RITE  Result Date: 01/05/2022 If Site Rite image not attached, placement could not be confirmed due to current cardiac rhythm.    EKG: Independently reviewed, with result as described above.    Assessment/Plan   Principal Problem:   Acute on chronic combined systolic and diastolic CHF (congestive heart failure) (HCC) Active Problems:   HLD (hyperlipidemia)   Atrial fibrillation (HCC)   COPD (chronic obstructive pulmonary disease) (HCC)   Allergic rhinitis   SOB (shortness of breath)   AKI (acute kidney injury) ([place])    #) Acute on chronic systolic/diastolic heart failure: dx of acute decompensation on the basis of presenting 4 to 5 days of progressive shortness of  breath associate with orthopnea, worsening of peripheral edema, with chest x-ray showing interval worsening of bilateral pulmonary edema as well as interval increase in size of bilateral pleural effusions, as further described above. This is in the context of a known history of chronic systolic/diastolic heart failure, with most recent echocardiogram performed in December 2022, which was notable for LVEF 25 to 30%, global left ventricular hypokinesis,  and grade 2 diastolic dysfunction. Etiology leading to presenting acutely decompensated heart failure unclear. Patient conveys good compliance with home diuretic therapy, which consists of Lasix 40 mg subcu daily, as well as good compliance with their additional home cardiac medications, which include metoprolol succinate.  Of note, outpatient diuresis efforts have been complicated by borderline hypotension, as further detailed above . overall, ACS leading to presenting acutely decompensated heart failure appears less likely at this time in the absence of any recent CP, presenting EKG showing no evidence of acute ischemic changes, as well as non-elevated troponin and spite of aforementioned duration of the patient's symptoms.   Of note, patient received Lasix 80 mg IV x1 while in the ED today. Presentation warrants additional IV diuresis, as further detailed below, with close monitoring of ensuing renal function, electrolytes,, volume status, and blood pressure as further noted below. Will also closely monitor ensuing HR as an additional means to evaluate volume status and help guide subsequent diuresis decision-making. As patient is already on a BB at home, will plan to continue this.  Of note, cardiology has been consulted.  Of note, the patient is not currently on an ACE inhibitor/ARB nor Isordil/hydralazine as an outpatient,  likely because of the aforementioned ongoing borderline soft blood pressures.    Plan:Holding diuresis and on Amio drip for rate  control.  Appreciate cardiology input..  Severe mitral regurg may need clip at some point.  Beta-blocker stopped.  Amio started.  To get left heart cath right heart cath followed by TEE-DC cardioversion.   #) Acute kidney injury: Presenting serum creatinine 1.73 relative to her baseline range of 1.2-1.3, with prior value noted to be 1.17 on 12/09/2021.  Suspect that this is prerenal in nature, in the setting of diminished renal perfusion gradient as a consequence of acute on chronic systolic/diastolic heart failure, as above.  Of note, urinalysis with microscopy from this evening showed no evidence of white blood cell or red blood cell casts, nor any evidence of protein.  Plan: Further evaluation management of presenting acute on chronic systolic/diastolic heart failure, as above, including plan for IV diuresis.  Monitor strict I's and O's and daily weights.  Tempt avoid nephrotoxic agents. Creatinine 1.4 today.  #) Paroxysmal atrial fibrillation: Documented history of such. In setting of CHA2DS2-VASc score of  8, there is an indication for chronic anticoagulation for thromboembolic prophylaxis.  It does not appear that she is chronically anticoagulated, and rationale for this is not currently clear to me at this time.  Home AV nodal blocking regimen: Outpatient beta-blocker.  Most recent echocardiogram performed in December 2022, as further detailed above. Presenting EKG suggestive of rate controlled atrial fibrillation.  There is also documentation of complicating factor of sick sinus syndrome, for which she has been following with electrophysiology cardiology as an outpatient.   Plan: monitor strict I's & O's and daily weights.  Amiodarone drip.  Beta-blocker stopped.  Heparin drip.   #) COPD: Documented history of such, with the patient acknowledging that she is a former smoker, and completely quit smoking approximately 25 years ago.  Follows close with outpatient pulmonology, who feel that patient's  recent shortness of breath is more on the basis of heart failure then underlying COPD, as above.  Patient ports good compliance with outpatient respiratory regimen which includes scheduled Trelegy as well as prn albuterol inhaler.  Overall, no clinical evidence to suggest acute COPD exacerbation at this time.  Plan: Continue outpatient Trelegy as well as as needed albuterol.  Monitor continuous pulse oximetry.  Add on serum magnesium level and check serum phosphorus level.   #) Hyperlipidemia: documented h/o such.  In the setting of a documented history of statin intolerance, she is on Zetia as outpatient.    Plan: continue home Zetia.    #) Anemia of chronic disease: Documented history of such, with baseline hemoglobin appearing to be approximately 12, with presenting hemoglobin slightly lower than his baseline, with presenting value noted to be 10.8, potentially representing a hemodelusional contribution in the setting of suspected acute volume overload as a consequence of presenting acute on chronic systolic/diastolic heart failure, as above.  No evidence of active bleed at this time.  She is on daily oral iron supplementation as outpatient.  Plan: Repeat CBC in the morning.  Continue home daily oral iron supplementation.   #) Allergic Rhinitis: documented h/o such, on scheduled intranasal Flonase as outpatient as well as loratadine.    Plan: cont home Flonase and loratadine.    #) acquired hypothyroidism: documented h/o such, on Synthroid as outpatient.   Plan: cont home Synthroid.    #) GERD: documented h/o such; on omeprazole as outpatient.   Plan: continue home PPI.   Chronic respiratory failure-on 3 L of oxygen at home chronically  Obtain physical therapy evaluation   Further recommendations pending on overall hospital course      Maytte Jacot A MD [facility]

## 2022-01-07 NOTE — Progress Notes (Signed)
ANTICOAGULATION CONSULT NOTE - Follow Up Consult  Pharmacy Consult for heparin Indication: atrial fibrillation  Labs: Recent Labs    01/05/22 1011 01/05/22 1011 01/11/2022 0130 01/05/2022 1145 01/05/2022 1351 01/05/2022 1355 01/24/2022 0500 01/12/2022 1055  HGB 12.3  --  10.9*  --  10.9* 11.6*   10.5* 10.8*  --   HCT 38.9  --  34.3*  --  32.0* 34.0*   31.0* 34.2*  --   PLT 205  --  178  --   --   --  163  --   APTT  --   --  31  --   --   --   --   --   HEPARINUNFRC  --    < > <0.10* 0.96*  --   --  0.19* 0.24*  CREATININE 1.53*  --  1.37*  --   --   --  1.38*  --    < > = values in this interval not displayed.    Assessment: 81 yo W on heparin for atrial fibrillation. Heparin was held for R/L heart cath 2/2 with consult to restart 4 hours after sheath removal.  Heparin level still low at 0.24 this morning, planning TEE/DCCV today. No bleeding issues noted.   Goal of Therapy:  Heparin level 0.3-0.7 units/ml Monitor platelets per anticoagulation protocol: yes   Plan:  Increase heparin 900 units/hr  Recheck tonight Monitor daily heparin level, CBC Monitor for signs/symptoms of bleeding   Erin Hearing PharmD., BCPS Clinical Pharmacist 01/29/2022 11:37 AM

## 2022-01-07 NOTE — TOC Initial Note (Signed)
Transition of Care [facility]) - Initial/Assessment Note    Patient Details  Name: [name] MRN: [id number] Date of Birth: August 24, 1941  Transition of [facility]) CM/SW Contact:    Erenest Rasher, RN Phone Number: [phone] 01/16/2022, 3:40 PM  Clinical Narrative:                 HF TOC CM spoke to pt at bedside. States she was recently placed on home oxygen. States she had HH in the past. Does not recall agency. Pt uses a cane at home. Husband is in the home to assist. Pt may benefit from HF [facility] RN. Will need HH RN orders with [facility]. Will continue to follow for dc needs.   Expected Discharge Plan: [place] Barriers to Discharge: Continued Medical Work up   Patient Goals and CMS Choice Patient states their goals for this hospitalization and ongoing recovery are:: would like to get back to her baseline CMS Medicare.gov Compare Post Acute Care list provided to:: Patient Choice offered to / list presented to : Patient  Expected Discharge Plan and Services Expected Discharge Plan: [place]   Discharge Planning Services: CM Consult Post Acute Care Choice: [place] arrangements for the past 2 months: Single Family Home                                      Prior Living Arrangements/Services Living arrangements for the past 2 months: Single Family Home Lives with:: Spouse Patient language and need for interpreter reviewed:: Yes Do you feel safe going back to the place where you live?: Yes      Need for Family Participation in Patient Care: Yes (Comment) Care giver support system in place?: Yes (comment) Current home services: DME (cane, oxygen ([employer])) Criminal Activity/Legal Involvement Pertinent to Current Situation/Hospitalization: No - Comment as needed  Activities of Daily Living Home Assistive Devices/Equipment: Eyeglasses ADL Screening (condition at time of admission) Patient's cognitive ability adequate to  safely complete daily activities?: Yes Is the patient deaf or have difficulty hearing?: No Does the patient have difficulty seeing, even when wearing glasses/contacts?: Yes Does the patient have difficulty concentrating, remembering, or making decisions?: No Patient able to express need for assistance with ADLs?: Yes Does the patient have difficulty dressing or bathing?: No Independently performs ADLs?: Yes (appropriate for developmental age) Does the patient have difficulty walking or climbing stairs?: No Weakness of Legs: None Weakness of Arms/Hands: None  Permission Sought/Granted Permission sought to share information with : Case Manager, Family Supports, PCP Permission granted to share information with : Yes, Verbal Permission Granted  Share Information with NAME: Ammanda Dobbins     Permission granted to share info w Relationship: husband  Permission granted to share info w Contact Information: [phone]  Emotional Assessment Appearance:: Appears stated age Attitude/Demeanor/Rapport: Gracious Affect (typically observed): Accepting Orientation: : Oriented to Self, Oriented to Place, Oriented to  Time, Oriented to Situation   Psych Involvement: No (comment)  Admission diagnosis:  Hypotension, unspecified hypotension type [I95.9] Acute on chronic systolic (congestive) heart failure (HCC) [I50.23] Atrial fibrillation, unspecified type [facility]) [I48.91] Patient Active Problem List   Diagnosis Date Noted   AKI (acute kidney injury) ([place]) 01/04/2022   Acute on chronic systolic (congestive) heart failure ([place]) 01/01/2022   Chronic respiratory failure with hypoxia ([place]) 12/17/2021   SOB (shortness of breath) 12/09/2021  COVID-19 virus infection 08/13/2021   Allergic reaction 07/19/2021   Cheek mass 05/20/2021   Pre-operative clearance 09/08/2020   Erosive osteoarthritis of both hands 10/17/2018   Arthritis of finger 04/26/2018   Arthritis of midtarsal joint of right foot  04/16/2018   TIA (transient ischemic attack)    Status post dilation of esophageal narrowing    Shingles    PVC's (premature ventricular contractions)    Orthostatic hypotension    NICM (nonischemic cardiomyopathy) (HCC)    Nephrolithiasis    Mild CAD    HTN (hypertension)    Ischemic colon ([place])    History of kidney stones    Hiatal hernia    Esophageal stricture    Cataract    Chronic combined systolic and diastolic CHF (congestive heart failure) (HCC)    Bradycardia    Atrial septal aneurysm    Anxiety    Weakness of right hip 11/16/2017   Stress fracture of ankle, initial encounter 11/16/2017   CKD (chronic kidney disease) stage 3, GFR 30-59 ml/min (HCC) 10/13/2017   Hoarseness 09/04/2017   Posterior tibial tendon dysfunction 08/28/2017   Drug hypersensitivity 06/22/2017   Lobar pneumonia, unspecified organism ([place]) 06/02/2017   PHN (postherpetic neuralgia) 04/16/2017   Abnormal TSH 04/12/2017   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[place]) 10/20/2016   Chronic atrial fibrillation ([place]) 09/17/2016   History of CVA (cerebrovascular accident) 09/14/2016   On home oxygen therapy 09/14/2016   Postoperative examination 09/14/2016   Acute on chronic combined systolic and diastolic CHF (congestive heart failure) ([place]) 08/17/2016   Syncope 08/10/2016   COPD (chronic obstructive pulmonary disease) ([place]) 08/10/2016   Chronic pain syndrome    Atrial fibrillation with rapid ventricular response ([place]) 07/27/2016   Combined congestive systolic and diastolic heart failure ([place]) 07/27/2016   HLD (hyperlipidemia) 07/27/2016   Insomnia 07/27/2016   Depression with anxiety 07/27/2016   Persistent atrial fibrillation ([place]) 07/27/2016   Atrial fibrillation ([place]) 07/27/2016   Essential hypertension 07/27/2016   Gluteus medius tear 03/15/2016   Hyperglycemia 11/12/2015   Stricture and stenosis of esophagus 03/03/2014   Right wrist pain 12/19/2013   Swelling, mass, or lump in chest 12/19/2013    Depression 11/29/2013   Dysphagia 09/30/2013   Lung nodule 08/01/2013   Special screening for malignant neoplasms, colon 03/20/2013   Screen for colon cancer 03/20/2013   Lower back pain 01/21/2013   COPD exacerbation ([place]) 09/05/2012   Encounter for long-term (current) use of high-risk medication 02/13/2012   Sleep related leg cramps 10/01/2011   Basal cell carcinoma    Osteoarthritis    DI (detrusor instability)    Preventative health care 08/07/2011   Transient cerebral ischemia 03/25/2009   FATIGUE 06/05/2008   Allergic rhinitis 04/24/2008   History of urinary stone 10/05/2007   RECTOCELE WITHOUT MENTION OF UTERINE PROLAPSE 10/04/2007   BLADDER SUSPENSION, HX OF 10/04/2007   Herniation of rectum into vagina 10/04/2007   HEMORRHOIDS 08/11/2007   Gold B Copd with acute bronchitis 08/11/2007   GERD 08/11/2007   History of cardiovascular disorder 08/11/2007   Hyperlipidemia 08/11/2007   Irritable bowel syndrome 08/11/2007   Hemorrhoids 08/11/2007   Stroke ([place]) 12/06/1999   PCP:  Biagio Borg, MD Pharmacy:   [facility] [address] [place] - [address] [place] [address] Phone: [phone] Fax: [phone]  [facility] [place], [address] [place] [address] Phone: [phone] Fax: [phone]  Social Determinants of Health (SDOH) Interventions    Readmission Risk Interventions No flowsheet data found.

## 2022-01-07 NOTE — Transfer of Care (Signed)
Immediate Anesthesia Transfer of Care Note  Patient: [name]  Procedure(s) Performed: TRANSESOPHAGEAL ECHOCARDIOGRAM (TEE) CARDIOVERSION  Patient Location: PACU  Anesthesia Type:MAC  Level of Consciousness: drowsy  Airway & Oxygen Therapy: Patient Spontanous Breathing and Patient connected to nasal cannula oxygen  Post-op Assessment: Report given to RN and Post -op Vital signs reviewed and stable  Post vital signs: Reviewed and stable  Last Vitals:  Vitals Value Taken Time  BP 117/81   Temp    Pulse 84   Resp 12   SpO2 96     Last Pain:  Vitals:   01/24/2022 1202  TempSrc: Temporal  PainSc: 8          Complications: No notable events documented.

## 2022-01-07 NOTE — Plan of Care (Signed)

## 2022-01-07 NOTE — CV Procedure (Signed)
° °  TRANSESOPHAGEAL ECHOCARDIOGRAM GUIDED DIRECT CURRENT CARDIOVERSION  NAME:  [name]   MRN: [id number] DOB:  11/26/1941   ADMIT DATE: 12/17/2021  INDICATIONS:  Atrial fibrillation  PROCEDURE:   Informed consent was obtained prior to the procedure. The risks, benefits and alternatives for the procedure were discussed and the patient comprehended these risks.  Risks include, but are not limited to, cough, sore throat, vomiting, nausea, somnolence, esophageal and stomach trauma or perforation, bleeding, low blood pressure, aspiration, pneumonia, infection, trauma to the teeth and death.    After a procedural time-out, the oropharynx was anesthetized and the patient was sedated by the anesthesia service. The transesophageal probe was inserted in the esophagus and stomach without difficulty and multiple views were obtained.   FINDINGS:  LEFT VENTRICLE: EF = 15% Global HK  RIGHT VENTRICLE: Severe HK  LEFT ATRIUM: Severely dilated  LEFT ATRIAL APPENDAGE: No Clot  RIGHT ATRIUM: Moderately dilated  AORTIC VALVE:  Trileaflet. Moderately calcified. Trivial AI  MITRAL VALVE:    Restricted posterior leaflet. Severe central MR  3D MVA 3.8 cm2 mean gradient 6 No visible MS  TRICUSPID VALVE: Normal mild TR  PULMONIC VALVE: Trivial PR  INTERATRIAL SEPTUM:  PERICARDIUM: No effusion  DESCENDING AORTA: Svere plaque   CARDIOVERSION:     Indications:  Atrial Fibrillation  Procedure Details:  Once the TEE was complete, the patient had the defibrillator pads placed in the anterior and posterior position. Once an appropriate level of sedation was achieved, the patient received a single biphasic, synchronized 200J shock with prompt conversion to sinus rhythm. No apparent complications.   Glori Bickers, MD  2:24 PM

## 2022-01-07 NOTE — Anesthesia Postprocedure Evaluation (Signed)
Anesthesia Post Note  Patient: [name]  Procedure(s) Performed: TRANSESOPHAGEAL ECHOCARDIOGRAM (TEE) CARDIOVERSION     Patient location during evaluation: Endoscopy Anesthesia Type: General Level of consciousness: awake and alert Pain management: pain level controlled Vital Signs Assessment: post-procedure vital signs reviewed and stable Respiratory status: spontaneous breathing, nonlabored ventilation, respiratory function stable and patient connected to nasal cannula oxygen Cardiovascular status: blood pressure returned to baseline and stable Postop Assessment: no apparent nausea or vomiting Anesthetic complications: no   No notable events documented.  Last Vitals:  Vitals:   01/17/2022 1332 02/01/2022 1344  BP: 130/66 (!) 117/56  Pulse: 97 87  Resp: (!) 22 (!) 22  Temp:    SpO2: 94% 100%    Last Pain:  Vitals:   01/22/2022 1344  TempSrc:   PainSc: 0-No pain                 Santa Lighter

## 2022-01-07 NOTE — Progress Notes (Addendum)
Advanced Heart Failure Rounding Note  PCP-Cardiologist: Lauren Meredith Leeds, [name]   Subjective:   2/1 A fib RVR--> Started on amio drip + heparin drip. PICC placed. Diuretics held.  2/2 RHC/LHC - Had cath with preserved cardiac output, min nonob CAD, elevated PCWP suggestive of severe MR.   Renal function stable today.   Anxious about the procedure.   Objective:   Weight Range: 53.6 kg Body mass index is 20.28 kg/m.   Vital Signs:   Temp:  [97.5 F (36.4 C)-98.1 F (36.7 C)] 97.5 F (36.4 C) (02/03 0741) Pulse Rate:  [0-140] 120 (02/03 0741) Resp:  [0-27] 20 (02/03 0741) BP: (89-154)/(58-123) 92/58 (02/03 0741) SpO2:  [93 %-100 %] 96 % (02/03 0741) FiO2 (%):  [32 %] 32 % (02/02 2100) Weight:  [52.2 kg-53.6 kg] 53.6 kg (02/03 0500) Last BM Date: 01/22/2022  Weight change: Filed Weights   01/21/2022 0500 01/14/2022 1149 01/12/2022 0500  Weight: 52.2 kg 52.2 kg 53.6 kg    Intake/Output:   Intake/Output Summary (Last 24 hours) at 01/31/2022 1125 Last data filed at 01/27/2022 1324 Gross per 24 hour  Intake --  Output 1 ml  Net -1 ml      Physical Exam  CVP 5 General:  No resp difficulty. Sitting in the chair.  HEENT: normal Neck: supple. no JVD. Carotids 2+ bilat; no bruits. No lymphadenopathy or thryomegaly appreciated. Cor: PMI nondisplaced. Irregular rate & rhythm. No rubs, gallops . 3/6 MR . Lungs: clear Abdomen: soft, nontender, nondistended. No hepatosplenomegaly. No bruits or masses. Good bowel sounds. Extremities: no cyanosis, clubbing, rash, edema. RUE PICC  Neuro: alert & orientedx3, cranial nerves grossly intact. moves all 4 extremities w/o difficulty. Affect pleasant  Telemetry  Remains in A fib RVR 100-130s   EKG    N/A  Labs    CBC Recent Labs    01/27/2022 0130 01/30/2022 1351 01/24/2022 1355 01/22/2022 0500  WBC 6.3  --   --  5.0  NEUTROABS 4.0  --   --  3.9  HGB 10.9*   < > 11.6*   10.5* 10.8*  HCT 34.3*   < > 34.0*   31.0* 34.2*  MCV 83.7   --   --  84.0  PLT 178  --   --  163   < > = values in this interval not displayed.   Basic Metabolic Panel Recent Labs    01/25/2022 0130 01/29/2022 1351 01/09/2022 1355 01/11/2022 0500  NA 137   < > 138   141 136  K 3.9   < > 3.6   3.2* 3.9  CL 97*  --   --  98  CO2 32  --   --  29  GLUCOSE 117*  --   --  119*  BUN 21  --   --  17  CREATININE 1.37*  --   --  1.38*  CALCIUM 9.1  --   --  9.2  MG 2.3  --   --   --    < > = values in this interval not displayed.   Liver Function Tests No results for input(s): AST, ALT, ALKPHOS, BILITOT, PROT, ALBUMIN in the last 72 hours.  No results for input(s): LIPASE, AMYLASE in the last 72 hours. Cardiac Enzymes No results for input(s): CKTOTAL, CKMB, CKMBINDEX, TROPONINI in the last 72 hours.  BNP: BNP (last 3 results) Recent Labs    01/04/2022 1737  BNP 2,812.3*    ProBNP (last 3  results) No results for input(s): PROBNP in the last 8760 hours.   D-Dimer No results for input(s): DDIMER in the last 72 hours. Hemoglobin A1C No results for input(s): HGBA1C in the last 72 hours. Fasting Lipid Panel No results for input(s): CHOL, HDL, LDLCALC, TRIG, CHOLHDL, LDLDIRECT in the last 72 hours. Thyroid Function Tests No results for input(s): TSH, T4TOTAL, T3FREE, THYROIDAB in the last 72 hours.  Invalid input(s): FREET3   Other results:   Imaging    CARDIAC CATHETERIZATION  Result Date: 01/23/2022   Prox RCA to Mid RCA lesion is 50% stenosed.   Prox LAD to Mid LAD lesion is 20% stenosed.   Ost Cx to Prox Cx lesion is 20% stenosed.   The left ventricular ejection fraction is less than 25% by visual estimate. Findings: Ao  = 97/64 (77) LV = 100/14 RA = 5 RV = 41/4 PA = 46/22 (34) PCW = 24 (intermittent v waves to 50) Fick cardiac output/index = 4.2/2.7 PVR = 1.7 WU Ao sat = 99% PA sat = 64%, 66% PAPi = 4.8 Assessment: 1. Minimal non-obstructive CAD 2. Severe NICM EF ~20% 3. Elevated PCWP with intermittent v-waves to 50 suggestive of severe  MR Plan/Discussion: Continue medical therapy. Lauren plan DC-CV of AF in am. Continue amio. Structural team following for possible MitraClip. Lauren Bickers, [name] 5:49 PM    Medications:     Scheduled Medications:  buPROPion  300 mg Oral Daily   Chlorhexidine Gluconate Cloth  6 each Topical Daily   colestipol  1 g Oral BID   ezetimibe  10 mg Oral Daily   ferrous sulfate  325 mg Oral Q breakfast   fluticasone  1 spray Each Nare BID   hydroxychloroquine  400 mg Oral QHS   levothyroxine  25 mcg Oral QAC breakfast   loratadine  10 mg Oral Daily   montelukast  10 mg Oral Daily   pantoprazole  40 mg Oral QHS   sodium chloride flush  10-40 mL Intracatheter Q12H   sodium chloride flush  3 mL Intravenous Q12H   sodium chloride flush  3 mL Intravenous Q12H    Infusions:  sodium chloride     amiodarone 30 mg/hr (01/30/2022 0747)   heparin 800 Units/hr (01/27/2022 2226)    PRN Medications: sodium chloride, acetaminophen, albuterol, guaiFENesin-dextromethorphan, ipratropium, melatonin, ondansetron (ZOFRAN) IV, sodium chloride flush, sodium chloride flush, traZODone    Patient Profile   [name] is a 81 year old with a history of PAF, moderate AS, severe MR, COPD, quit smoking 25 years ago, PAD, HTN, hyperlipidemia, CVA, NICM, tobacco abuse, ischemic bowel with resection in 2017,11/04/22 left ureteral calculus S/P utreteroscopy and stent,  and chronic HFrEF.     Admitted A/C HFrEF and A fib RVR.   Assessment/Plan   A/C HFrEF -Had LHC in 2017 minimal disease.  -Echo this admit EF down to 25% from previous Echo EF 40-45% in 2018 .  BNP on admit > 2800.  - Had cath with preserved cardiac output, min nonob CAD, elevated PCWP suggestive of severe MR.  - Volume status stable. Does not need diuretics.  - No room for GDMT with low bp.    2. Severe MR  -12/28/21 TEE with severe MR -Structural heart has consulted    3. PAF--> In A fib RVR - Last dose of eliquis 01/04/2022 (morning)  2/1  metoprolol tartrate stopped  2/1 started on amio drip for rate control.  - Remains tachy but rate now < 120s.  Continue  amio drip for rate control - Continue  heparin drip now.   - Plan to TEE/DC-CV today.    4. AKI  -Recently had diuretics increased on 1/5. Creatinine on that time was 1.2.  -On admit creatinine was 1.7, today 1.4 today    5. A/C Respiratory Failure -H/O COPD with triple inhalers and recently placed on oxygen 12/17/21.  - at home on triple inhalers.  -No wheeze.  - Continue oxygen    6. Anemia  Hgb 10.8. Iron Sats 7%.   7. Hypothyroid -TSH ok on admit  -On levothyroxine.   TEE/DC-CV today    Length of Stay: 4  Lauren Clegg, NP  01/21/2022, 11:25 AM  Advanced Heart Failure Team Pager [phone] (M-F; 7a - 5p)  Please contact [facility] for night-coverage after hours (5p -7a ) and weekends on [email or web address]  Patient seen and examined with the above-signed Advanced Practice Provider and/or Housestaff. I personally reviewed laboratory data, imaging studies and relevant notes. I independently examined the patient and formulated the important aspects of the plan. I have edited the note to reflect any of my changes or salient points. I have personally discussed the plan with the patient and/or family.  Remains in AF with RVR. Rates 130s. Very anxious and weak. No co-ox today.   Cath yesterday with minimal CAD. EF 15-20% Mildly elevated filling pressures with significant v-waves in PCPW tracing   General:  Elderly weak appearing. Anxious  No resp difficulty HEENT: normal Neck: supple. JVP 8-9 Carotids 2+ bilat; no bruits. No lymphadenopathy or thryomegaly appreciated. Cor: PMI nondisplaced. Irreg tachy  soft MR Lungs: clear Abdomen: soft, nontender, nondistended. No hepatosplenomegaly. No bruits or masses. Good bowel sounds. Extremities: no cyanosis, clubbing, rash, edema Neuro: alert & orientedx3, cranial nerves grossly intact. moves all 4 extremities w/o difficulty.  Affect pleasant  She remains very tenuous. Lauren plan TEE-DCCV today and hopefully she can achieve/maintain NSR. Suspect she Lauren need mTEER but Lauren need to figure out best timing. D/w [name].   Continue heparin, IV amio. Resume diuretics.   Lauren Bickers, [name]  12:01 PM

## 2022-01-08 DIAGNOSIS — I5043 Acute on chronic combined systolic (congestive) and diastolic (congestive) heart failure: Secondary | ICD-10-CM | POA: Diagnosis not present

## 2022-01-08 LAB — CBC WITH DIFFERENTIAL/PLATELET
Abs Immature Granulocytes: 0.02 10*3/uL (ref 0.00–0.07)
Basophils Absolute: 0 10*3/uL (ref 0.0–0.1)
Basophils Relative: 0 %
Eosinophils Absolute: 0 10*3/uL (ref 0.0–0.5)
Eosinophils Relative: 0 %
HCT: 34 % — ABNORMAL LOW (ref 36.0–46.0)
Hemoglobin: 10.6 g/dL — ABNORMAL LOW (ref 12.0–15.0)
Immature Granulocytes: 0 %
Lymphocytes Relative: 11 %
Lymphs Abs: 0.7 10*3/uL (ref 0.7–4.0)
MCH: 26.2 pg (ref 26.0–34.0)
MCHC: 31.2 g/dL (ref 30.0–36.0)
MCV: 84.2 fL (ref 80.0–100.0)
Monocytes Absolute: 1 10*3/uL (ref 0.1–1.0)
Monocytes Relative: 17 %
Neutro Abs: 4.3 10*3/uL (ref 1.7–7.7)
Neutrophils Relative %: 72 %
Platelets: 182 10*3/uL (ref 150–400)
RBC: 4.04 MIL/uL (ref 3.87–5.11)
RDW: 17.2 % — ABNORMAL HIGH (ref 11.5–15.5)
WBC: 6 10*3/uL (ref 4.0–10.5)
nRBC: 0 % (ref 0.0–0.2)

## 2022-01-08 LAB — BASIC METABOLIC PANEL
Anion gap: 10 (ref 5–15)
BUN: 18 mg/dL (ref 8–23)
CO2: 29 mmol/L (ref 22–32)
Calcium: 9.2 mg/dL (ref 8.9–10.3)
Chloride: 94 mmol/L — ABNORMAL LOW (ref 98–111)
Creatinine, Ser: 1.49 mg/dL — ABNORMAL HIGH (ref 0.44–1.00)
GFR, Estimated: 35 mL/min — ABNORMAL LOW (ref >60)
Glucose, Bld: 100 mg/dL — ABNORMAL HIGH (ref 70–99)
Potassium: 3.7 mmol/L (ref 3.5–5.1)
Sodium: 133 mmol/L — ABNORMAL LOW (ref 135–145)

## 2022-01-08 LAB — HEPARIN LEVEL (UNFRACTIONATED)
Heparin Unfractionated: 0.51 IU/mL (ref 0.30–0.70)
Heparin Unfractionated: 0.51 IU/mL (ref 0.30–0.70)

## 2022-01-08 LAB — COOXEMETRY PANEL
Carboxyhemoglobin: 1.1 % (ref 0.5–1.5)
Methemoglobin: 1.2 % (ref 0.0–1.5)
O2 Saturation: 57.2 %
Total hemoglobin: 11.2 g/dL — ABNORMAL LOW (ref 12.0–16.0)

## 2022-01-08 MED ORDER — POTASSIUM CHLORIDE CRYS ER 20 MEQ PO TBCR
30.0000 meq | EXTENDED_RELEASE_TABLET | Freq: Once | ORAL | Status: AC
  Administered 2022-01-08: 30 meq via ORAL
  Filled 2022-01-08: qty 1

## 2022-01-08 MED ORDER — LOPERAMIDE HCL 2 MG PO CAPS
2.0000 mg | ORAL_CAPSULE | ORAL | Status: DC | PRN
  Administered 2022-01-08 – 2022-01-12 (×4): 2 mg via ORAL
  Filled 2022-01-08 (×4): qty 1

## 2022-01-08 NOTE — Progress Notes (Signed)
ANTICOAGULATION CONSULT NOTE - Follow Up Consult  Pharmacy Consult for heparin Indication: atrial fibrillation  Labs: Recent Labs    01/21/2022 0130 01/26/2022 1145 01/20/2022 1355 02/01/2022 0500 01/05/2022 1055 01/29/2022 2230 01/08/22 0540 01/08/22 0740  HGB 10.9*   < > 11.6*   10.5* 10.8*  --   --  10.6*  --   HCT 34.3*   < > 34.0*   31.0* 34.2*  --   --  34.0*  --   PLT 178  --   --  163  --   --  182  --   APTT 31  --   --   --   --   --   --   --   HEPARINUNFRC <0.10*   < >  --  0.19* 0.24* 0.11*  --  0.51  CREATININE 1.37*  --   --  1.38*  --   --  1.49*  --    < > = values in this interval not displayed.     Assessment: 81yo female who was on apix PTA 2.5mg  BID for a fib that was left off med rec and not continued on admit  to ED on 1/30. Patient underwent TEE/DCCV 2/3 with plans for MitraClip.   Heparin level this morning therapeutic at 0.51 on 1100 units/hr. CBC stable. No issues with bleeding per RN.   Goal of Therapy:  Heparin level 0.3-0.7 units/ml   Plan:  Continue heparin gtt at 1100 units/hr.  Heparin level at 1600.  Daily HL and CBC while on heparin F/u plans for MitraClip  Cathrine Muster, PharmD PGY2 Cardiology Pharmacy Resident Phone: [phone] 01/08/2022  10:41 AM  Please check [email or web address] for unit-specific pharmacy phone numbers.

## 2022-01-08 NOTE — Progress Notes (Signed)
ANTICOAGULATION CONSULT NOTE - Follow Up Consult  Pharmacy Consult for heparin Indication: atrial fibrillation  Labs: Recent Labs    01/12/2022 0130 01/31/2022 1145 01/08/2022 1355 01/30/2022 0500 01/05/2022 1055 01/14/2022 2230 01/08/22 0540 01/08/22 0740 01/08/22 1532  HGB 10.9*   < > 11.6*   10.5* 10.8*  --   --  10.6*  --   --   HCT 34.3*   < > 34.0*   31.0* 34.2*  --   --  34.0*  --   --   PLT 178  --   --  163  --   --  182  --   --   APTT 31  --   --   --   --   --   --   --   --   HEPARINUNFRC <0.10*   < >  --  0.19*   < > 0.11*  --  0.51 0.51  CREATININE 1.37*  --   --  1.38*  --   --  1.49*  --   --    < > = values in this interval not displayed.     Assessment: 81yo female who was on apix PTA 2.5mg  BID for a fib that was left off med rec and not continued on admit  to ED on 1/30. Patient underwent TEE/DCCV 2/3 with plans for MitraClip.   Repeat heparin level this afternoon remains therapeutic at 0.51 on 1100 units/hr. No issues with bleeding per RN.   Goal of Therapy:  Heparin level 0.3-0.7 units/ml   Plan:  Continue heparin gtt at 1100 units/hr.  Daily HL and CBC while on heparin F/u plans for MitraClip  Albertina Parr, PharmD., BCCCP Clinical Pharmacist Please refer to [facility] for unit-specific pharmacist

## 2022-01-08 NOTE — Progress Notes (Addendum)
Advanced Heart Failure Rounding Note  PCP-Cardiologist: Will Meredith Leeds, MD   Subjective:   2/1 A fib RVR--> Started on amio drip + heparin drip. PICC placed. Diuretics held.  2/2 RHC/LHC - Had cath with preserved cardiac output, min nonob CAD, elevated PCWP suggestive of severe MR.  2/4 TEE/DC-CV  LVEF 15% RV severely HK. DC-CV to NSR  Remains in NSR after DC-CV yesterday. On IV amio   Feels a bit better. Still SOB with any activity + Wheezing. CVP 3-4  Co-ox 45% -> 57%   Objective:   Weight Range: 53.5 kg Body mass index is 20.25 kg/m.   Vital Signs:   Temp:  [96.9 F (36.1 C)-98.1 F (36.7 C)] 97.9 F (36.6 C) (02/04 0734) Pulse Rate:  [86-134] 95 (02/04 0734) Resp:  [17-26] 20 (02/04 0734) BP: (88-130)/(56-78) 92/62 (02/04 0734) SpO2:  [93 %-100 %] 94 % (02/04 0734) Weight:  [53.5 kg] 53.5 kg (02/04 0329) Last BM Date: 01/27/2022  Weight change: Filed Weights   01/12/2022 1149 01/12/2022 0500 01/08/22 0329  Weight: 52.2 kg 53.6 kg 53.5 kg    Intake/Output:   Intake/Output Summary (Last 24 hours) at 01/08/2022 1043 Last data filed at 01/08/2022 1000 Gross per 24 hour  Intake 1358.09 ml  Output 1450 ml  Net -91.91 ml       Physical Exam   General:  Elderly weak appearing. No resp difficulty HEENT: normal Neck: supple. no JVD. Carotids 2+ bilat; no bruits. No lymphadenopathy or thryomegaly appreciated. Cor: PMI nondisplaced. Regular rate & rhythm. Soft MR Lungs: + basilar crackles and mild wheezing Abdomen: soft, nontender, nondistended. No hepatosplenomegaly. No bruits or masses. Good bowel sounds. Extremities: no cyanosis, clubbing, rash, edema Neuro: alert & orientedx3, cranial nerves grossly intact. moves all 4 extremities w/o difficulty. Affect pleasant   Telemetry   Sinus 80-90s + PVCs. Personally reviewed   Labs    CBC Recent Labs    01/30/2022 0500 01/08/22 0540  WBC 5.0 6.0  NEUTROABS 3.9 4.3  HGB 10.8* 10.6*  HCT 34.2* 34.0*  MCV  84.0 84.2  PLT 163 536    Basic Metabolic Panel Recent Labs    01/12/2022 0130 01/05/2022 1351 01/21/2022 0500 01/08/22 0540  NA 137   < > 136 133*  K 3.9   < > 3.9 3.7  CL 97*  --  98 94*  CO2 32  --  29 29  GLUCOSE 117*  --  119* 100*  BUN 21  --  17 18  CREATININE 1.37*  --  1.38* 1.49*  CALCIUM 9.1  --  9.2 9.2  MG 2.3  --   --   --    < > = values in this interval not displayed.    Liver Function Tests No results for input(s): AST, ALT, ALKPHOS, BILITOT, PROT, ALBUMIN in the last 72 hours.  No results for input(s): LIPASE, AMYLASE in the last 72 hours. Cardiac Enzymes No results for input(s): CKTOTAL, CKMB, CKMBINDEX, TROPONINI in the last 72 hours.  BNP: BNP (last 3 results) Recent Labs    12/31/2021 1737  BNP 2,812.3*     ProBNP (last 3 results) No results for input(s): PROBNP in the last 8760 hours.   D-Dimer No results for input(s): DDIMER in the last 72 hours. Hemoglobin A1C No results for input(s): HGBA1C in the last 72 hours. Fasting Lipid Panel No results for input(s): CHOL, HDL, LDLCALC, TRIG, CHOLHDL, LDLDIRECT in the last 72 hours. Thyroid Function Tests No  results for input(s): TSH, T4TOTAL, T3FREE, THYROIDAB in the last 72 hours.  Invalid input(s): FREET3   Other results:   Imaging    No results found.   Medications:     Scheduled Medications:  buPROPion  300 mg Oral Daily   Chlorhexidine Gluconate Cloth  6 each Topical Daily   colestipol  1 g Oral BID   ezetimibe  10 mg Oral Daily   ferrous sulfate  325 mg Oral Q breakfast   fluticasone  1 spray Each Nare BID   furosemide  80 mg Intravenous Daily   hydroxychloroquine  400 mg Oral QHS   levothyroxine  25 mcg Oral QAC breakfast   loratadine  10 mg Oral Daily   montelukast  10 mg Oral Daily   pantoprazole  40 mg Oral QHS   sodium chloride flush  10-40 mL Intracatheter Q12H   sodium chloride flush  3 mL Intravenous Q12H    Infusions:  sodium chloride     amiodarone 30 mg/hr  (01/10/2022 1845)   heparin 1,100 Units/hr (01/08/22 0234)    PRN Medications: sodium chloride, acetaminophen, guaiFENesin-dextromethorphan, ipratropium, levalbuterol, melatonin, ondansetron (ZOFRAN) IV, sodium chloride flush, sodium chloride flush, traZODone    Patient Profile   [name] is a 81 year old with a history of PAF, moderate AS, severe MR, COPD, quit smoking 25 years ago, PAD, HTN, hyperlipidemia, CVA, NICM, tobacco abuse, ischemic bowel with resection in 2017,11/04/22 left ureteral calculus S/P utreteroscopy and stent,  and chronic HFrEF.     Admitted A/C HFrEF and A fib RVR.   Assessment/Plan   A/C HFrEF -Echo this admit EF down to 25% from previous Echo EF 40-45% in 2018 .  BNP on admit > 2800.  - Cath 2/2 with minimal CAD preserved cardiac output, elevated PCWP (intermittent v-waves up to 50) suggestive of severe MR.  - TEE 2/3 LVEF 15% RV severe HK. Severe MR - CVP low. Need to keep lungs dry. Continue lasix 80 IV daily for now. May need to pull back.  - No room for GDMT with low bp.    2. Severe MR  -12/28/21 TEE with severe MR -Structural heart has consulted  - Possible MitraClip this Thursday   3. PAF--> In A fib RVR - Last dose of eliquis 12/12/2021 (morning) -> heparin - s/p DC-CV 2/3 - Remains in NSR. Continue IV amio    4. AKI  -Recently had diuretics increased on 1/5. Creatinine on that time was 1.2.  -On admit creatinine was 1.7, today 1.49 today    5. A/C Respiratory Failure - On exam has severe underlying COPD - On triple inhalers and recently placed on oxygen 12/17/21.  - Continue nebs. Pulmonary toilet - improved with diuresis   6. Anemia  - Hgb 10.8. Iron Sats 7%.   Overall very tenuous with severe HF, mitral regurgitation and severe underlying lung disease.   Length of Stay: [place], MD  01/08/2022, 10:43 AM  Advanced Heart Failure Team Pager [phone] (M-F; 7a - 5p)  Please contact [facility] for night-coverage after  hours (5p -7a ) and weekends on [email or web address]

## 2022-01-08 NOTE — Progress Notes (Signed)
Patient's HR will go up to the 120-140s with minimal activity such as going to the [facility] and back to bed.  Encouraged patient to use purewick to prevent tachycardia until better controlled with meds, but patient refused.

## 2022-01-08 NOTE — Progress Notes (Signed)
HPI: [name] is a 81 y.o. female with medical history significant for chronic systolic/diastolic heart failure, with most recent LVEF 25 to 30%, paroxysmal atrial fibrillation complicated by sick sinus syndrome, COPD, hyperlipidemia, anemia of chronic disease, who is admitted to [facility] on 12/10/2021 with acute on chronic systolic/diastolic heart failure after presenting from home to [facility] ED complaining of shortness of breath.   The patient reports 4 to 5 days of progressive shortness of breath associated orthopnea and worsening of edema in the bilateral lower extremities.  She reports associated nonproductive cough in the absence of any associated subjective fever, chills, rigors, or generalized myalgias.  Denies any associated chest pain, palpitations, diaphoresis, nausea, vomiting, dizziness, presyncope, or syncope.  Denies any new onset calf tenderness or new lower extremity erythema.  No hemoptysis.  Also denies any recent melena or hematochezia.  No recent rhinitis, rhinorrhea, sore throat, wheezing, rash.  She reports good compliance with her outpatient diuretic regimen which consists of Lasix 40 mg p.o. daily.  She confirms a history of chronic systolic/diastolic heart failure, with most recent echocardiogram from 11/26/2021 notable for LVEF 25 to 30% with global left ventricular hypokinesis, grade 2 diastolic dysfunction.  Per chart review, it appears that the nature of her chronic heart failure is felt to be on the basis of nonischemic cardiomyopathy.  She also has a history of COPD acknowledging that she is a former smoker, and completely quit smoking approximately 25 years ago.  She follows with pulmonology as an outpatient, with recent associated appointment revealing pulmonology's opinion that her progressive shortness of breath is more cardiac than pulmonary in primary origin at this point.   Her diuresis as an outpatient has been complicated by intermittent hypotension.   Additionally, per chart review, her baseline serum creatinine appears to be 1.2-1.3, with most recent prior value noted to be 1.17 on 12/09/2021.   ED Course:  Vital signs in the ED were notable for the following: Afebrile; heart rate 93-1 05; blood pressure 87/62 - 106/82, respiratory rate 14-24, oxygen saturation 97 to 100% on room air.  Labs were notable for the following: CMP was notable for the following: Sodium 135, potassium 4.1, creatinine 1.73.  High-sensitivity troponin I x2 values were both found to be 13.  BNP 2800 compared to most recent prior value of 340 in 2017.  CBC notable for episode count 5200, hemoglobin 10.8 relative most recent prior value of 12 in April 2022.  Urinalysis showed no white blood cells, no red blood cells, and was negative for protein.  COVID-19/influenza PCR found to be negative.  Imaging and additional notable ED work-up: EKG shows atrial fibrillation with interventricular conduction delay, heart rate 108, no evidence of T wave or ST changes, including no evidence of ST elevation.  Chest x-ray, in comparison to most recent prior chest x-ray from 12/09/2021 showed interval worsening of bilateral pulmonary edema as well as interval increase in size of the bilateral pleural effusions, while demonstrating no evidence of infiltrate or pneumothorax.  EDP discussed the patient's case with on-call cardiology, who will formally consult, with additional recommendations pending at this time.  While in the ED, the following were administered: Lasix 80 mg IV x1.  Subsequently, the patient was admitted to the PCU for further evaluation management of presenting acute on chronic systolic/diastolic heart failure complicated by borderline hypotension as well as acute kidney injury.  Subjective Had cardioversion yesterday by Dr. Haroldine Laws which seem to temporarily work  Objective  Physical Exam: Vitals:   01/09/2022 2321 01/08/22 0329 01/08/22 0400 01/08/22 0734  BP: (!)  89/59 (!) 88/63  92/62  Pulse: 98 (!) 130 86 95  Resp: 20 17  20   Temp: 98.1 F (36.7 C) 97.6 F (36.4 C)  97.9 F (36.6 C)  TempSrc: Oral Oral  Oral  SpO2: 93% 94%  94%  Weight:  53.5 kg    Height:        General: appears to be stated age; alert, oriented; mildly increased work of breathing noted Skin: warm, dry, no rash Head:  AT/[place] Mouth:  Oral mucosa membranes appear moist, normal dentition Neck: supple; trachea midline Heart:  RRR; did not appreciate any M/R/G Lungs: Bibasilar crackles noted, but otherwise CTAB, did not appreciate any wheezes,or rhonchi Abdomen: + BS; soft, ND, NT Vascular: 2+ pedal pulses b/l; 2+ radial pulses b/l Extremities: 1-2+ edema in the bilateral lower extremities, no muscle wasting Neuro: strength and sensation intact in upper and lower extremities b/l     Labs on Admission: I have personally reviewed following labs and imaging studies  CBC: Recent Labs  Lab 01/04/22 0316 01/05/22 1011 01/30/2022 0130 01/12/2022 1351 01/16/2022 1355 01/20/2022 0500 01/08/22 0540  WBC 6.3 7.2 6.3  --   --  5.0 6.0  NEUTROABS 4.1 4.7 4.0  --   --  3.9 4.3  HGB 11.1* 12.3 10.9* 10.9* 11.6*   10.5* 10.8* 10.6*  HCT 37.0 38.9 34.3* 32.0* 34.0*   31.0* 34.2* 34.0*  MCV 85.6 84.4 83.7  --   --  84.0 84.2  PLT 174 205 178  --   --  163 716   Basic Metabolic Panel: Recent Labs  Lab 01/04/22 0316 01/05/22 1011 01/21/2022 0130 01/26/2022 1351 01/20/2022 1355 01/23/2022 0500 01/08/22 0540  NA 138 138 137 137 138   141 136 133*  K 4.1 3.5 3.9 3.6 3.6   3.2* 3.9 3.7  CL 101 97* 97*  --   --  98 94*  CO2 27 32 32  --   --  29 29  GLUCOSE 83 127* 117*  --   --  119* 100*  BUN 29* 25* 21  --   --  17 18  CREATININE 1.58* 1.53* 1.37*  --   --  1.38* 1.49*  CALCIUM 8.9 9.3 9.1  --   --  9.2 9.2  MG 1.7  --  2.3  --   --   --   --   PHOS 3.6  --   --   --   --   --   --    GFR: Estimated Creatinine Clearance: 24.6 mL/min (A) (by C-G formula based on SCr of 1.49 mg/dL  (H)). Liver Function Tests: Recent Labs  Lab 12/17/2021 1913 01/04/22 0316  AST 19 19  ALT 18 19  ALKPHOS 88 92  BILITOT 0.4 0.6  PROT 5.4* 5.8*  ALBUMIN 3.2* 3.5   No results for input(s): LIPASE, AMYLASE in the last 168 hours. No results for input(s): AMMONIA in the last 168 hours. Coagulation Profile: No results for input(s): INR, PROTIME in the last 168 hours. Cardiac Enzymes: No results for input(s): CKTOTAL, CKMB, CKMBINDEX, TROPONINI in the last 168 hours. BNP (last 3 results) No results for input(s): PROBNP in the last 8760 hours. HbA1C: No results for input(s): HGBA1C in the last 72 hours. CBG: No results for input(s): GLUCAP in the last 168 hours. Lipid Profile: No results for input(s): CHOL, HDL, LDLCALC, TRIG,  CHOLHDL, LDLDIRECT in the last 72 hours. Thyroid Function Tests: No results for input(s): TSH, T4TOTAL, FREET4, T3FREE, THYROIDAB in the last 72 hours.  Anemia Panel: No results for input(s): VITAMINB12, FOLATE, FERRITIN, TIBC, IRON, RETICCTPCT in the last 72 hours.  Urine analysis:    Component Value Date/Time   COLORURINE YELLOW 12/08/2021 2046   APPEARANCEUR CLEAR 12/15/2021 2046   LABSPEC 1.025 12/09/2021 2046   PHURINE 5.5 12/14/2021 2046   GLUCOSEU NEGATIVE 12/23/2021 2046   GLUCOSEU NEGATIVE 10/29/2019 1135   HGBUR NEGATIVE 12/18/2021 2046   BILIRUBINUR NEGATIVE 12/06/2021 2046   KETONESUR NEGATIVE 12/23/2021 2046   PROTEINUR NEGATIVE 12/26/2021 2046   UROBILINOGEN 0.2 10/29/2019 1135   NITRITE NEGATIVE 01/02/2022 2046   LEUKOCYTESUR NEGATIVE 12/11/2021 2046    Radiological Exams on Admission: CARDIAC CATHETERIZATION  Result Date: 01/09/2022   Prox RCA to Mid RCA lesion is 50% stenosed.   Prox LAD to Mid LAD lesion is 20% stenosed.   Ost Cx to Prox Cx lesion is 20% stenosed.   The left ventricular ejection fraction is less than 25% by visual estimate. Findings: Ao  = 97/64 (77) LV = 100/14 RA = 5 RV = 41/4 PA = 46/22 (34) PCW = 24  (intermittent v waves to 50) Fick cardiac output/index = 4.2/2.7 PVR = 1.7 WU Ao sat = 99% PA sat = 64%, 66% PAPi = 4.8 Assessment: 1. Minimal non-obstructive CAD 2. Severe NICM EF ~20% 3. Elevated PCWP with intermittent v-waves to 50 suggestive of severe MR Plan/Discussion: Continue medical therapy. Will plan DC-CV of AF in am. Continue amio. Structural team following for possible MitraClip. Glori Bickers, MD 5:49 PM    EKG: Independently reviewed, with result as described above.    Assessment/Plan   Principal Problem:   Acute on chronic combined systolic and diastolic CHF (congestive heart failure) (HCC) Active Problems:   HLD (hyperlipidemia)   Atrial fibrillation (HCC)   COPD (chronic obstructive pulmonary disease) (HCC)   Allergic rhinitis   SOB (shortness of breath)   AKI (acute kidney injury) ([place])    #) Acute on chronic systolic/diastolic heart failure: dx of acute decompensation on the basis of presenting 4 to 5 days of progressive shortness of breath associate with orthopnea, worsening of peripheral edema, with chest x-ray showing interval worsening of bilateral pulmonary edema as well as interval increase in size of bilateral pleural effusions, as further described above. This is in the context of a known history of chronic systolic/diastolic heart failure, with most recent echocardiogram performed in December 2022, which was notable for LVEF 25 to 30%, global left ventricular hypokinesis, and grade 2 diastolic dysfunction. Etiology leading to presenting acutely decompensated heart failure unclear. Patient conveys good compliance with home diuretic therapy, which consists of Lasix 40 mg subcu daily, as well as good compliance with their additional home cardiac medications, which include metoprolol succinate.  Of note, outpatient diuresis efforts have been complicated by borderline hypotension, as further detailed above . overall, ACS leading to presenting acutely decompensated heart  failure appears less likely at this time in the absence of any recent CP, presenting EKG showing no evidence of acute ischemic changes, as well as non-elevated troponin and spite of aforementioned duration of the patient's symptoms.   Of note, patient received Lasix 80 mg IV x1 while in the ED today. Presentation warrants additional IV diuresis, as further detailed below, with close monitoring of ensuing renal function, electrolytes,, volume status, and blood pressure as further noted below. Will also closely  monitor ensuing HR as an additional means to evaluate volume status and help guide subsequent diuresis decision-making. As patient is already on a BB at home, will plan to continue this.  Of note, cardiology has been consulted.  Of note, the patient is not currently on an ACE inhibitor/ARB nor Isordil/hydralazine as an outpatient,  likely because of the aforementioned ongoing borderline soft blood pressures.    Plan:Holding diuresis and on Amio drip for rate control.  Appreciate cardiology input..  Severe mitral regurg may need clip at some point.  Beta-blocker stopped.  Amio started.  Status post left heart cath right heart cath followed by TEE-DC cardioversion.  For MR clip on Thursday   #) Acute kidney injury: Presenting serum creatinine 1.73 relative to her baseline range of 1.2-1.3, with prior value noted to be 1.17 on 12/09/2021.  Suspect that this is prerenal in nature, in the setting of diminished renal perfusion gradient as a consequence of acute on chronic systolic/diastolic heart failure, as above.  Of note, urinalysis with microscopy from this evening showed no evidence of white blood cell or red blood cell casts, nor any evidence of protein.  Plan: Further evaluation management of presenting acute on chronic systolic/diastolic heart failure, as above, including plan for IV diuresis.  Monitor strict I's and O's and daily weights.  Tempt avoid nephrotoxic agents. Creatinine 1.4 today.  #)  Paroxysmal atrial fibrillation: Documented history of such. In setting of CHA2DS2-VASc score of  8, there is an indication for chronic anticoagulation for thromboembolic prophylaxis.  It does not appear that she is chronically anticoagulated, and rationale for this is not currently clear to me at this time.  Home AV nodal blocking regimen: Outpatient beta-blocker.  Most recent echocardiogram performed in December 2022, as further detailed above. Presenting EKG suggestive of rate controlled atrial fibrillation.  There is also documentation of complicating factor of sick sinus syndrome, for which she has been following with electrophysiology cardiology as an outpatient.   Plan: monitor strict I's & O's and daily weights.  Amiodarone drip.  Beta-blocker stopped.  Heparin drip.  Appears cardioversion was unsuccessful.   #) COPD: Documented history of such, with the patient acknowledging that she is a former smoker, and completely quit smoking approximately 25 years ago.  Follows close with outpatient pulmonology, who feel that patient's recent shortness of breath is more on the basis of heart failure then underlying COPD, as above.  Patient ports good compliance with outpatient respiratory regimen which includes scheduled Trelegy as well as prn albuterol inhaler.  Overall, no clinical evidence to suggest acute COPD exacerbation at this time.  Plan: Continue outpatient Trelegy as well as as needed albuterol.  Monitor continuous pulse oximetry.  Add on serum magnesium level and check serum phosphorus level.   #) Hyperlipidemia: documented h/o such.  In the setting of a documented history of statin intolerance, she is on Zetia as outpatient.    Plan: continue home Zetia.    #) Anemia of chronic disease: Documented history of such, with baseline hemoglobin appearing to be approximately 12, with presenting hemoglobin slightly lower than his baseline, with presenting value noted to be 10.8, potentially  representing a hemodelusional contribution in the setting of suspected acute volume overload as a consequence of presenting acute on chronic systolic/diastolic heart failure, as above.  No evidence of active bleed at this time.  She is on daily oral iron supplementation as outpatient.  Plan: Repeat CBC in the morning.  Continue home daily oral iron supplementation.   #)  Allergic Rhinitis: documented h/o such, on scheduled intranasal Flonase as outpatient as well as loratadine.    Plan: cont home Flonase and loratadine.    #) acquired hypothyroidism: documented h/o such, on Synthroid as outpatient.   Plan: cont home Synthroid.    #) GERD: documented h/o such; on omeprazole as outpatient.   Plan: continue home PPI.   Chronic respiratory failure-on 3 L of oxygen at home chronically  Obtain physical therapy evaluation while in hospital   Further recommendations pending on overall hospital course      Arthuro Canelo A MD [facility]

## 2022-01-09 DIAGNOSIS — I5043 Acute on chronic combined systolic (congestive) and diastolic (congestive) heart failure: Secondary | ICD-10-CM | POA: Diagnosis not present

## 2022-01-09 LAB — HEPARIN LEVEL (UNFRACTIONATED)
Heparin Unfractionated: 0.23 IU/mL — ABNORMAL LOW (ref 0.30–0.70)
Heparin Unfractionated: 0.37 IU/mL (ref 0.30–0.70)
Heparin Unfractionated: >1.1 IU/mL — ABNORMAL HIGH (ref 0.30–0.70)
Heparin Unfractionated: 0.4 IU/mL (ref 0.30–0.70)

## 2022-01-09 LAB — COOXEMETRY PANEL
Carboxyhemoglobin: 1.4 % (ref 0.5–1.5)
Methemoglobin: 0.7 % (ref 0.0–1.5)
O2 Saturation: 60.5 %
Total hemoglobin: 9.9 g/dL — ABNORMAL LOW (ref 12.0–16.0)

## 2022-01-09 LAB — CBC WITH DIFFERENTIAL/PLATELET
Abs Immature Granulocytes: 0.02 10*3/uL (ref 0.00–0.07)
Basophils Absolute: 0 10*3/uL (ref 0.0–0.1)
Basophils Relative: 1 %
Eosinophils Absolute: 0.1 10*3/uL (ref 0.0–0.5)
Eosinophils Relative: 1 %
HCT: 33.3 % — ABNORMAL LOW (ref 36.0–46.0)
Hemoglobin: 10.5 g/dL — ABNORMAL LOW (ref 12.0–15.0)
Immature Granulocytes: 0 %
Lymphocytes Relative: 9 %
Lymphs Abs: 0.5 10*3/uL — ABNORMAL LOW (ref 0.7–4.0)
MCH: 26.4 pg (ref 26.0–34.0)
MCHC: 31.5 g/dL (ref 30.0–36.0)
MCV: 83.9 fL (ref 80.0–100.0)
Monocytes Absolute: 0.9 10*3/uL (ref 0.1–1.0)
Monocytes Relative: 16 %
Neutro Abs: 4.4 10*3/uL (ref 1.7–7.7)
Neutrophils Relative %: 73 %
Platelets: 151 10*3/uL (ref 150–400)
RBC: 3.97 MIL/uL (ref 3.87–5.11)
RDW: 17.1 % — ABNORMAL HIGH (ref 11.5–15.5)
WBC: 6 10*3/uL (ref 4.0–10.5)
nRBC: 0 % (ref 0.0–0.2)

## 2022-01-09 LAB — BASIC METABOLIC PANEL
Anion gap: 10 (ref 5–15)
BUN: 15 mg/dL (ref 8–23)
CO2: 30 mmol/L (ref 22–32)
Calcium: 9 mg/dL (ref 8.9–10.3)
Chloride: 97 mmol/L — ABNORMAL LOW (ref 98–111)
Creatinine, Ser: 1.41 mg/dL — ABNORMAL HIGH (ref 0.44–1.00)
GFR, Estimated: 37 mL/min — ABNORMAL LOW (ref >60)
Glucose, Bld: 109 mg/dL — ABNORMAL HIGH (ref 70–99)
Potassium: 3.4 mmol/L — ABNORMAL LOW (ref 3.5–5.1)
Sodium: 137 mmol/L (ref 135–145)

## 2022-01-09 MED ORDER — AMIODARONE IV BOLUS ONLY 150 MG/100ML
150.0000 mg | Freq: Once | INTRAVENOUS | Status: AC
  Administered 2022-01-09: 150 mg via INTRAVENOUS
  Filled 2022-01-09: qty 100

## 2022-01-09 MED ORDER — POTASSIUM CHLORIDE CRYS ER 20 MEQ PO TBCR
40.0000 meq | EXTENDED_RELEASE_TABLET | Freq: Once | ORAL | Status: AC
  Administered 2022-01-09: 40 meq via ORAL
  Filled 2022-01-09: qty 2

## 2022-01-09 NOTE — Progress Notes (Signed)
ANTICOAGULATION CONSULT NOTE - Follow Up Consult  Pharmacy Consult for heparin Indication: atrial fibrillation  Labs: Recent Labs    01/25/2022 0500 01/08/2022 1055 01/08/22 0540 01/08/22 0740 01/08/22 1532 01/09/22 0609 01/09/22 1421  HGB 10.8*  --  10.6*  --   --  10.5*  --   HCT 34.2*  --  34.0*  --   --  33.3*  --   PLT 163  --  182  --   --  151  --   HEPARINUNFRC 0.19*   < >  --    < > 0.51 0.23* 0.37  CREATININE 1.38*  --  1.49*  --   --  1.41*  --    < > = values in this interval not displayed.    Assessment: 81yo female who was on apix PTA 2.5mg  BID for a fib that was left off med rec and not continued on admit  to ED on 1/30. Patient underwent TEE/DCCV 2/3 with plans for MitraClip.   Heparin level this afternoon therapeutic at 0.37 on 1200 units/hr. CBC stable this morning. No issues with bleeding per RN.   Goal of Therapy:  Heparin level 0.3-0.7 units/ml   Plan:  Continue heparin gtt at 1200 units/hr.  Heparin level at 2100  Daily HL and CBC while on heparin F/u plans for MitraClip  Cathrine Muster, PharmD PGY2 Cardiology Pharmacy Resident Phone: [phone] 01/09/2022  3:07 PM  Please check [email or web address] for unit-specific pharmacy phone numbers.

## 2022-01-09 NOTE — Progress Notes (Signed)
Patient's sister stated she was concerned patient had an area that looked reddened and not blanchable, lower coccyx (ischial tuberosity)  On inspection, Mepilex was applied to coccyx area but upside down with tab that is to cover this particular area on the top. Area is red and not blanchable.   New Mepilex applied.

## 2022-01-09 NOTE — Progress Notes (Signed)
HPI: [name] is a 81 y.o. female with medical history significant for chronic systolic/diastolic heart failure, with most recent LVEF 25 to 30%, paroxysmal atrial fibrillation complicated by sick sinus syndrome, COPD, hyperlipidemia, anemia of chronic disease, who is admitted to [facility] on 12/18/2021 with acute on chronic systolic/diastolic heart failure after presenting from home to [facility] ED complaining of shortness of breath.   The patient reports 4 to 5 days of progressive shortness of breath associated orthopnea and worsening of edema in the bilateral lower extremities.  She reports associated nonproductive cough in the absence of any associated subjective fever, chills, rigors, or generalized myalgias.  Denies any associated chest pain, palpitations, diaphoresis, nausea, vomiting, dizziness, presyncope, or syncope.  Denies any new onset calf tenderness or new lower extremity erythema.  No hemoptysis.  Also denies any recent melena or hematochezia.  No recent rhinitis, rhinorrhea, sore throat, wheezing, rash.  She reports good compliance with her outpatient diuretic regimen which consists of Lasix 40 mg p.o. daily.  She confirms a history of chronic systolic/diastolic heart failure, with most recent echocardiogram from 11/26/2021 notable for LVEF 25 to 30% with global left ventricular hypokinesis, grade 2 diastolic dysfunction.  Per chart review, it appears that the nature of her chronic heart failure is felt to be on the basis of nonischemic cardiomyopathy.  She also has a history of COPD acknowledging that she is a former smoker, and completely quit smoking approximately 25 years ago.  She follows with pulmonology as an outpatient, with recent associated appointment revealing pulmonology's opinion that her progressive shortness of breath is more cardiac than pulmonary in primary origin at this point.   Her diuresis as an outpatient has been complicated by intermittent hypotension.   Additionally, per chart review, her baseline serum creatinine appears to be 1.2-1.3, with most recent prior value noted to be 1.17 on 12/09/2021.   ED Course:  Vital signs in the ED were notable for the following: Afebrile; heart rate 93-1 05; blood pressure 87/62 - 106/82, respiratory rate 14-24, oxygen saturation 97 to 100% on room air.  Labs were notable for the following: CMP was notable for the following: Sodium 135, potassium 4.1, creatinine 1.73.  High-sensitivity troponin I x2 values were both found to be 13.  BNP 2800 compared to most recent prior value of 340 in 2017.  CBC notable for episode count 5200, hemoglobin 10.8 relative most recent prior value of 12 in April 2022.  Urinalysis showed no white blood cells, no red blood cells, and was negative for protein.  COVID-19/influenza PCR found to be negative.  Imaging and additional notable ED work-up: EKG shows atrial fibrillation with interventricular conduction delay, heart rate 108, no evidence of T wave or ST changes, including no evidence of ST elevation.  Chest x-ray, in comparison to most recent prior chest x-ray from 12/09/2021 showed interval worsening of bilateral pulmonary edema as well as interval increase in size of the bilateral pleural effusions, while demonstrating no evidence of infiltrate or pneumothorax.  EDP discussed the patient's case with on-call cardiology, who will formally consult, with additional recommendations pending at this time.  While in the ED, the following were administered: Lasix 80 mg IV x1.  Subsequently, the patient was admitted to the PCU for further evaluation management of presenting acute on chronic systolic/diastolic heart failure complicated by borderline hypotension as well as acute kidney injury.  Subjective Patient feels like her shortness of breath is better.  She has been sleeping much  better.  Objective    Physical Exam: Vitals:   01/09/22 0034 01/09/22 0352 01/09/22 0500 01/09/22 0806   BP: 90/75 91/63  113/75  Pulse: (!) 104 (!) 116 86 93  Resp: 20 16 20 20   Temp: 97.7 F (36.5 C) 97.9 F (36.6 C)  98.2 F (36.8 C)  TempSrc: Oral Oral  Oral  SpO2: 97% 98% 95% 92%  Weight:  53 kg    Height:        General: appears to be stated age; alert, oriented; no increased work of breathing Skin: warm, dry, no rash Head:  AT/Lafe Mouth:  Oral mucosa membranes appear moist, normal dentition Neck: supple; trachea midline Heart:  RRR; did not appreciate any M/R/G Lungs: Bibasilar crackles noted, but otherwise CTAB, did not appreciate any wheezes,or rhonchi Abdomen: + BS; soft, ND, NT Vascular: 2+ pedal pulses b/l; 2+ radial pulses b/l Extremities: 1+ edema in the bilateral lower extremities, no muscle wasting Neuro: strength and sensation intact in upper and lower extremities b/l     Labs on Admission: I have personally reviewed following labs and imaging studies  CBC: Recent Labs  Lab 01/05/22 1011 01/23/2022 0130 01/05/2022 1351 01/29/2022 1355 01/10/2022 0500 01/08/22 0540 01/09/22 0609  WBC 7.2 6.3  --   --  5.0 6.0 6.0  NEUTROABS 4.7 4.0  --   --  3.9 4.3 4.4  HGB 12.3 10.9* 10.9* 11.6*   10.5* 10.8* 10.6* 10.5*  HCT 38.9 34.3* 32.0* 34.0*   31.0* 34.2* 34.0* 33.3*  MCV 84.4 83.7  --   --  84.0 84.2 83.9  PLT 205 178  --   --  163 182 962   Basic Metabolic Panel: Recent Labs  Lab 01/04/22 0316 01/05/22 1011 01/14/2022 0130 01/23/2022 1351 01/26/2022 1355 02/01/2022 0500 01/08/22 0540 01/09/22 0609  NA 138 138 137 137 138   141 136 133* 137  K 4.1 3.5 3.9 3.6 3.6   3.2* 3.9 3.7 3.4*  CL 101 97* 97*  --   --  98 94* 97*  CO2 27 32 32  --   --  29 29 30   GLUCOSE 83 127* 117*  --   --  119* 100* 109*  BUN 29* 25* 21  --   --  17 18 15   CREATININE 1.58* 1.53* 1.37*  --   --  1.38* 1.49* 1.41*  CALCIUM 8.9 9.3 9.1  --   --  9.2 9.2 9.0  MG 1.7  --  2.3  --   --   --   --   --   PHOS 3.6  --   --   --   --   --   --   --    GFR: Estimated Creatinine Clearance:  25.7 mL/min (A) (by C-G formula based on SCr of 1.41 mg/dL (H)). Liver Function Tests: Recent Labs  Lab 12/16/2021 1913 01/04/22 0316  AST 19 19  ALT 18 19  ALKPHOS 88 92  BILITOT 0.4 0.6  PROT 5.4* 5.8*  ALBUMIN 3.2* 3.5   No results for input(s): LIPASE, AMYLASE in the last 168 hours. No results for input(s): AMMONIA in the last 168 hours. Coagulation Profile: No results for input(s): INR, PROTIME in the last 168 hours. Cardiac Enzymes: No results for input(s): CKTOTAL, CKMB, CKMBINDEX, TROPONINI in the last 168 hours. BNP (last 3 results) No results for input(s): PROBNP in the last 8760 hours. HbA1C: No results for input(s): HGBA1C in the last 72 hours. CBG:  No results for input(s): GLUCAP in the last 168 hours. Lipid Profile: No results for input(s): CHOL, HDL, LDLCALC, TRIG, CHOLHDL, LDLDIRECT in the last 72 hours. Thyroid Function Tests: No results for input(s): TSH, T4TOTAL, FREET4, T3FREE, THYROIDAB in the last 72 hours.  Anemia Panel: No results for input(s): VITAMINB12, FOLATE, FERRITIN, TIBC, IRON, RETICCTPCT in the last 72 hours.  Urine analysis:    Component Value Date/Time   COLORURINE YELLOW 01/02/2022 2046   APPEARANCEUR CLEAR 12/09/2021 2046   LABSPEC 1.025 12/15/2021 2046   PHURINE 5.5 12/17/2021 2046   GLUCOSEU NEGATIVE 12/30/2021 2046   GLUCOSEU NEGATIVE 10/29/2019 1135   HGBUR NEGATIVE 12/10/2021 2046   BILIRUBINUR NEGATIVE 12/28/2021 2046   KETONESUR NEGATIVE 12/29/2021 2046   PROTEINUR NEGATIVE 12/23/2021 2046   UROBILINOGEN 0.2 10/29/2019 1135   NITRITE NEGATIVE 12/24/2021 2046   LEUKOCYTESUR NEGATIVE 12/28/2021 2046    Radiological Exams on Admission: No results found.   EKG: Independently reviewed, with result as described above.    Assessment/Plan   Principal Problem:   Acute on chronic combined systolic and diastolic CHF (congestive heart failure) (HCC) Active Problems:   HLD (hyperlipidemia)   Atrial fibrillation (HCC)   COPD  (chronic obstructive pulmonary disease) (HCC)   Allergic rhinitis   SOB (shortness of breath)   AKI (acute kidney injury) ([place])    #) Acute on chronic systolic/diastolic heart failure: dx of acute decompensation on the basis of presenting 4 to 5 days of progressive shortness of breath associate with orthopnea, worsening of peripheral edema, with chest x-ray showing interval worsening of bilateral pulmonary edema as well as interval increase in size of bilateral pleural effusions, as further described above. This is in the context of a known history of chronic systolic/diastolic heart failure, with most recent echocardiogram performed in December 2022, which was notable for LVEF 25 to 30%, global left ventricular hypokinesis, and grade 2 diastolic dysfunction. Etiology leading to presenting acutely decompensated heart failure unclear. Patient conveys good compliance with home diuretic therapy, which consists of Lasix 40 mg subcu daily, as well as good compliance with their additional home cardiac medications, which include metoprolol succinate.  Of note, outpatient diuresis efforts have been complicated by borderline hypotension, as further detailed above . overall, ACS leading to presenting acutely decompensated heart failure appears less likely at this time in the absence of any recent CP, presenting EKG showing no evidence of acute ischemic changes, as well as non-elevated troponin and spite of aforementioned duration of the patient's symptoms.   Of note, patient received Lasix 80 mg IV x1 while in the ED today. Presentation warrants additional IV diuresis, as further detailed below, with close monitoring of ensuing renal function, electrolytes,, volume status, and blood pressure as further noted below. Will also closely monitor ensuing HR as an additional means to evaluate volume status and help guide subsequent diuresis decision-making. As patient is already on a BB at home, will plan to continue this.   Of note, cardiology has been consulted.  Of note, the patient is not currently on an ACE inhibitor/ARB nor Isordil/hydralazine as an outpatient,  likely because of the aforementioned ongoing borderline soft blood pressures.    Plan:Holding diuresis and on Amio drip for rate control.  Appreciate cardiology input..  Severe mitral regurg may need clip at some point.  Beta-blocker stopped.  Amio started.  Status post left heart cath right heart cath followed by TEE-DC cardioversion.  For MR clip on Thursday   #) Acute kidney injury: Presenting serum  creatinine 1.73 relative to her baseline range of 1.2-1.3, with prior value noted to be 1.17 on 12/09/2021.  Suspect that this is prerenal in nature, in the setting of diminished renal perfusion gradient as a consequence of acute on chronic systolic/diastolic heart failure, as above.  Of note, urinalysis with microscopy from this evening showed no evidence of white blood cell or red blood cell casts, nor any evidence of protein.  Plan: Further evaluation management of presenting acute on chronic systolic/diastolic heart failure, as above, including plan for IV diuresis.  Monitor strict I's and O's and daily weights.  Tempt avoid nephrotoxic agents. Creatinine 1.4 today.  #) Paroxysmal atrial fibrillation: Documented history of such. In setting of CHA2DS2-VASc score of  8, there is an indication for chronic anticoagulation for thromboembolic prophylaxis.  It does not appear that she is chronically anticoagulated, and rationale for this is not currently clear to me at this time.  Home AV nodal blocking regimen: Outpatient beta-blocker.  Most recent echocardiogram performed in December 2022, as further detailed above. Presenting EKG suggestive of rate controlled atrial fibrillation.  There is also documentation of complicating factor of sick sinus syndrome, for which she has been following with electrophysiology cardiology as an outpatient.   Plan: monitor strict  I's & O's and daily weights.  Amiodarone drip.  Beta-blocker stopped.  Heparin drip.  Appears cardioversion was unsuccessful.   #) COPD: Documented history of such, with the patient acknowledging that she is a former smoker, and completely quit smoking approximately 25 years ago.  Follows close with outpatient pulmonology, who feel that patient's recent shortness of breath is more on the basis of heart failure then underlying COPD, as above.  Patient ports good compliance with outpatient respiratory regimen which includes scheduled Trelegy as well as prn albuterol inhaler.  Overall, no clinical evidence to suggest acute COPD exacerbation at this time.  Plan: Continue outpatient Trelegy as well as as needed albuterol.  Monitor continuous pulse oximetry.  Add on serum magnesium level and check serum phosphorus level.   #) Hyperlipidemia: documented h/o such.  In the setting of a documented history of statin intolerance, she is on Zetia as outpatient.    Plan: continue home Zetia.    #) Anemia of chronic disease: Documented history of such, with baseline hemoglobin appearing to be approximately 12, with presenting hemoglobin slightly lower than his baseline, with presenting value noted to be 10.8, potentially representing a hemodelusional contribution in the setting of suspected acute volume overload as a consequence of presenting acute on chronic systolic/diastolic heart failure, as above.  No evidence of active bleed at this time.  She is on daily oral iron supplementation as outpatient.  Plan: Repeat CBC in the morning.  Continue home daily oral iron supplementation.   #) Allergic Rhinitis: documented h/o such, on scheduled intranasal Flonase as outpatient as well as loratadine.    Plan: cont home Flonase and loratadine.    #) acquired hypothyroidism: documented h/o such, on Synthroid as outpatient.   Plan: cont home Synthroid.    #) GERD: documented h/o such; on omeprazole as  outpatient.   Plan: continue home PPI.   Chronic respiratory failure-on 3 L of oxygen at home chronically  Obtain physical therapy evaluation while in hospital   Further recommendations pending on overall hospital course Disposition plan pending proceeding with mitral clip repair.  Respiratory status is tenuous secondary to advanced heart failure and underlying COPD.      Dulcinea Kinser A MD [facility]

## 2022-01-09 NOTE — Progress Notes (Signed)
ANTICOAGULATION CONSULT NOTE - Follow Up Consult  Pharmacy Consult for heparin Indication: atrial fibrillation  Labs: Recent Labs    01/29/2022 0500 01/26/2022 1055 01/08/22 0540 01/08/22 0740 01/08/22 1532 01/09/22 0609  HGB 10.8*  --  10.6*  --   --  10.5*  HCT 34.2*  --  34.0*  --   --  33.3*  PLT 163  --  182  --   --  151  HEPARINUNFRC 0.19*   < >  --  0.51 0.51 0.23*  CREATININE 1.38*  --  1.49*  --   --  1.41*   < > = values in this interval not displayed.    Assessment: 81yo female subtherapeutic on heparin after two levels at goal; no infusion issues or signs of bleeding per RN.  Goal of Therapy:  Heparin level 0.3-0.7 units/ml   Plan:  Will increase heparin infusion by 2 units/kg/hr to 1200 units/hr and check level in 8 hours.    Wynona Neat, PharmD, BCPS  01/09/2022,6:38 AM

## 2022-01-09 NOTE — Progress Notes (Signed)
ANTICOAGULATION CONSULT NOTE - Follow Up Consult  Pharmacy Consult for heparin Indication: atrial fibrillation  Labs: Recent Labs    01/17/2022 0500 01/27/2022 1055 01/08/22 0540 01/08/22 0740 01/09/22 0609 01/09/22 1421 01/09/22 1740 01/09/22 2236  HGB 10.8*  --  10.6*  --  10.5*  --   --   --   HCT 34.2*  --  34.0*  --  33.3*  --   --   --   PLT 163  --  182  --  151  --   --   --   HEPARINUNFRC 0.19*   < >  --    < > 0.23* 0.37 >1.10* 0.40  CREATININE 1.38*  --  1.49*  --  1.41*  --   --   --    < > = values in this interval not displayed.     Assessment/Plan:  81yo female remains therapeutic on heparin. Will continue infusion at current rate of 1200 units/hr and monitor daily level.   Wynona Neat, PharmD, BCPS  01/09/2022,11:08 PM

## 2022-01-09 NOTE — Plan of Care (Signed)
  Problem: Education: Goal: Knowledge of General Education information will improve Description Including pain rating scale, medication(s)/side effects and non-pharmacologic comfort measures Outcome: Progressing   Problem: Health Behavior/Discharge Planning: Goal: Ability to manage health-related needs will improve Outcome: Progressing   Problem: Clinical Measurements: Goal: Diagnostic test results will improve Outcome: Progressing Goal: Respiratory complications will improve Outcome: Progressing Goal: Cardiovascular complication will be avoided Outcome: Progressing   Problem: Activity: Goal: Risk for activity intolerance will decrease Outcome: Progressing   Problem: Nutrition: Goal: Adequate nutrition will be maintained Outcome: Progressing   Problem: Coping: Goal: Level of anxiety will decrease Outcome: Progressing   

## 2022-01-09 NOTE — Progress Notes (Addendum)
Advanced Heart Failure Rounding Note  PCP-Cardiologist: Will Meredith Leeds, MD   Subjective:   2/1 A fib RVR--> Started on amio drip + heparin drip. PICC placed. Diuretics held.  2/2 RHC/LHC - Had cath with preserved cardiac output, min nonob CAD, elevated PCWP suggestive of severe MR.  2/4 TEE/DC-CV  LVEF 15% RV severely HK. DC-CV to NSR  In/out of AF overnight. Now in AF 110-120. Remains on lasix 80 IV daily. Weight down 1 pound.  Remains SOB and weak. SCr stable at 1.4   Family says she is not eating. 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 Had colostomy x 3 months. Has lost > 30 pounds since  Co-ox 45% -> 57% -> 61%  Objective:   Weight Range: 53 kg Body mass index is 20.06 kg/m.   Vital Signs:   Temp:  [97.5 F (36.4 C)-98.2 F (36.8 C)] 98.2 F (36.8 C) (02/05 0806) Pulse Rate:  [86-136] 93 (02/05 0806) Resp:  [15-20] 20 (02/05 0806) BP: (90-113)/(63-75) 113/75 (02/05 0806) SpO2:  [92 %-98 %] 92 % (02/05 0806) Weight:  [53 kg] 53 kg (02/05 0352) Last BM Date: 01/08/22  Weight change: Filed Weights   01/31/2022 0500 01/08/22 0329 01/09/22 0352  Weight: 53.6 kg 53.5 kg 53 kg    Intake/Output:   Intake/Output Summary (Last 24 hours) at 01/09/2022 0952 Last data filed at 01/09/2022 0910 Gross per 24 hour  Intake 1849.35 ml  Output 500 ml  Net 1349.35 ml       Physical Exam   General:  Cachetic Elderly weak appearing. No resp difficulty HEENT: normal Neck: supple. no JVD. Carotids 2+ bilat; no bruits. No lymphadenopathy or thryomegaly appreciated. Cor: PMI nondisplaced. Irregular tachy No rubs, gallops or murmurs. Lungs: + crackles mild wheeze Abdomen: soft, nontender, nondistended. No hepatosplenomegaly. No bruits or masses. Good bowel sounds. Extremities: no cyanosis, clubbing, rash, edema Neuro: alert & orientedx3, cranial nerves grossly intact. moves all 4 extremities w/o difficulty. Affect  pleasant   Telemetry   AF 110-120s + PVCs. Personally reviewed   Labs    CBC Recent Labs    01/08/22 0540 01/09/22 0609  WBC 6.0 6.0  NEUTROABS 4.3 4.4  HGB 10.6* 10.5*  HCT 34.0* 33.3*  MCV 84.2 83.9  PLT 182 761    Basic Metabolic Panel Recent Labs    01/08/22 0540 01/09/22 0609  NA 133* 137  K 3.7 3.4*  CL 94* 97*  CO2 29 30  GLUCOSE 100* 109*  BUN 18 15  CREATININE 1.49* 1.41*  CALCIUM 9.2 9.0    Liver Function Tests No results for input(s): AST, ALT, ALKPHOS, BILITOT, PROT, ALBUMIN in the last 72 hours.  No results for input(s): LIPASE, AMYLASE in the last 72 hours. Cardiac Enzymes No results for input(s): CKTOTAL, CKMB, CKMBINDEX, TROPONINI in the last 72 hours.  BNP: BNP (last 3 results) Recent Labs    12/11/2021 1737  BNP 2,812.3*     ProBNP (last 3 results) No results for input(s): PROBNP in the last 8760 hours.   D-Dimer No results for input(s): DDIMER in the last 72 hours. Hemoglobin A1C No results for input(s): HGBA1C in the last 72 hours. Fasting Lipid Panel No results for input(s): CHOL, HDL, LDLCALC, TRIG, CHOLHDL, LDLDIRECT in the last 72 hours. Thyroid Function Tests No results for input(s): TSH, T4TOTAL, T3FREE, THYROIDAB in the last 72 hours.  Invalid input(s): FREET3   Other results:  Imaging    No results found.   Medications:     Scheduled Medications:  buPROPion  300 mg Oral Daily   Chlorhexidine Gluconate Cloth  6 each Topical Daily   colestipol  1 g Oral BID   ezetimibe  10 mg Oral Daily   ferrous sulfate  325 mg Oral Q breakfast   fluticasone  1 spray Each Nare BID   furosemide  80 mg Intravenous Daily   hydroxychloroquine  400 mg Oral QHS   levothyroxine  25 mcg Oral QAC breakfast   loratadine  10 mg Oral Daily   montelukast  10 mg Oral Daily   pantoprazole  40 mg Oral QHS   sodium chloride flush  10-40 mL Intracatheter Q12H   sodium chloride flush  3 mL Intravenous Q12H    Infusions:  sodium  chloride     amiodarone 30 mg/hr (01/09/22 0816)   heparin 1,200 Units/hr (01/09/22 0646)    PRN Medications: sodium chloride, acetaminophen, guaiFENesin-dextromethorphan, ipratropium, levalbuterol, loperamide, melatonin, ondansetron (ZOFRAN) IV, sodium chloride flush, sodium chloride flush, traZODone    Patient Profile   [name] is a 81 year old with a history of PAF, moderate AS, severe MR, COPD, quit smoking 25 years ago, PAD, HTN, hyperlipidemia, CVA, NICM, tobacco abuse, ischemic bowel with resection in 2017,11/04/22 left ureteral calculus S/P utreteroscopy and stent,  and chronic HFrEF.     Admitted A/C HFrEF and A fib RVR.   Assessment/Plan   A/C HFrEF -Echo this admit EF down to 25% from previous Echo EF 40-45% in 2018 .  BNP on admit > 2800.  - Cath 2/2 with minimal CAD preserved cardiac output, elevated PCWP (intermittent v-waves up to 50) suggestive of severe MR.  - TEE 2/3 LVEF 15% RV severe HK. Severe MR - CVP low. Need to keep lungs dry. Continue lasix 80 IV daily for now. May need to pull back.  - No room for GDMT with low bp.    2. Severe MR  -12/28/21 TEE with severe MR -Structural heart has consulted  - Possible MitraClip this Thursday   3. Persistent AF - Last dose of eliquis 12/30/2021 (morning) -> heparin - s/p DC-CV 2/3 - In/out AF overnight. Now back in AF with RVR - Rebolus amio. Increase gtt to 60   4. AKI  -Recently had diuretics increased on 1/5. Creatinine on that time was 1.2.  -On admit creatinine was 1.7, today 1.41 today    5. A/C Respiratory Failure - On exam has severe underlying COPD - On triple inhalers and recently placed on oxygen 12/17/21.  - Continue nebs. Pulmonary toilet - improved with diuresis   6. Anemia  - Hgb 10.5. Iron Sats 7%.   7. Hypokalemia - supp  8. 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 Nighttime core-trak feeds?   Overall very tenuous with severe HF, mitral regurgitation, severe malnutrition  and severe underlying lung disease.   Length of Stay: [place], MD  01/09/2022, 9:52 AM  Advanced Heart Failure Team Pager [phone] (M-F; 7a - 5p)  Please contact [facility] for night-coverage after hours (5p -7a ) and weekends on [email or web address]

## 2022-01-10 ENCOUNTER — Inpatient Hospital Stay (HOSPITAL_COMMUNITY): Payer: Medicare Other

## 2022-01-10 ENCOUNTER — Encounter (HOSPITAL_COMMUNITY): Payer: Self-pay | Admitting: Internal Medicine

## 2022-01-10 ENCOUNTER — Ambulatory Visit: Payer: Medicare Other | Admitting: Physician Assistant

## 2022-01-10 DIAGNOSIS — I5043 Acute on chronic combined systolic (congestive) and diastolic (congestive) heart failure: Secondary | ICD-10-CM | POA: Diagnosis not present

## 2022-01-10 LAB — GLUCOSE, CAPILLARY
Glucose-Capillary: 127 mg/dL — ABNORMAL HIGH (ref 70–99)
Glucose-Capillary: 149 mg/dL — ABNORMAL HIGH (ref 70–99)
Glucose-Capillary: 196 mg/dL — ABNORMAL HIGH (ref 70–99)

## 2022-01-10 LAB — MAGNESIUM: Magnesium: 1.5 mg/dL — ABNORMAL LOW (ref 1.7–2.4)

## 2022-01-10 LAB — CBC WITH DIFFERENTIAL/PLATELET
Abs Immature Granulocytes: 0.03 10*3/uL (ref 0.00–0.07)
Basophils Absolute: 0 10*3/uL (ref 0.0–0.1)
Basophils Relative: 1 %
Eosinophils Absolute: 0.1 10*3/uL (ref 0.0–0.5)
Eosinophils Relative: 1 %
HCT: 33.9 % — ABNORMAL LOW (ref 36.0–46.0)
Hemoglobin: 10.4 g/dL — ABNORMAL LOW (ref 12.0–15.0)
Immature Granulocytes: 1 %
Lymphocytes Relative: 8 %
Lymphs Abs: 0.5 10*3/uL — ABNORMAL LOW (ref 0.7–4.0)
MCH: 26.4 pg (ref 26.0–34.0)
MCHC: 30.7 g/dL (ref 30.0–36.0)
MCV: 86 fL (ref 80.0–100.0)
Monocytes Absolute: 0.9 10*3/uL (ref 0.1–1.0)
Monocytes Relative: 14 %
Neutro Abs: 4.9 10*3/uL (ref 1.7–7.7)
Neutrophils Relative %: 75 %
Platelets: 169 10*3/uL (ref 150–400)
RBC: 3.94 MIL/uL (ref 3.87–5.11)
RDW: 17.2 % — ABNORMAL HIGH (ref 11.5–15.5)
WBC: 6.4 10*3/uL (ref 4.0–10.5)
nRBC: 0 % (ref 0.0–0.2)

## 2022-01-10 LAB — COOXEMETRY PANEL
Carboxyhemoglobin: 1.3 % (ref 0.5–1.5)
Methemoglobin: 0.9 % (ref 0.0–1.5)
O2 Saturation: 53.7 %
Total hemoglobin: 10 g/dL — ABNORMAL LOW (ref 12.0–16.0)

## 2022-01-10 LAB — BASIC METABOLIC PANEL
Anion gap: 11 (ref 5–15)
BUN: 15 mg/dL (ref 8–23)
CO2: 26 mmol/L (ref 22–32)
Calcium: 8.6 mg/dL — ABNORMAL LOW (ref 8.9–10.3)
Chloride: 96 mmol/L — ABNORMAL LOW (ref 98–111)
Creatinine, Ser: 1.34 mg/dL — ABNORMAL HIGH (ref 0.44–1.00)
GFR, Estimated: 40 mL/min — ABNORMAL LOW (ref >60)
Glucose, Bld: 225 mg/dL — ABNORMAL HIGH (ref 70–99)
Potassium: 4.3 mmol/L (ref 3.5–5.1)
Sodium: 133 mmol/L — ABNORMAL LOW (ref 135–145)

## 2022-01-10 LAB — PREALBUMIN: Prealbumin: 11.1 mg/dL — ABNORMAL LOW (ref 18–38)

## 2022-01-10 LAB — HEPARIN LEVEL (UNFRACTIONATED)
Heparin Unfractionated: 0.29 IU/mL — ABNORMAL LOW (ref 0.30–0.70)
Heparin Unfractionated: 0.32 IU/mL (ref 0.30–0.70)

## 2022-01-10 MED ORDER — ADULT MULTIVITAMIN W/MINERALS CH
1.0000 | ORAL_TABLET | Freq: Every day | ORAL | Status: DC
  Administered 2022-01-10 – 2022-01-13 (×4): 1 via ORAL
  Filled 2022-01-10 (×4): qty 1

## 2022-01-10 MED ORDER — VITAL 1.5 CAL PO LIQD
1000.0000 mL | ORAL | Status: DC
  Administered 2022-01-10: 1000 mL
  Filled 2022-01-10 (×2): qty 1000

## 2022-01-10 MED ORDER — BOOST / RESOURCE BREEZE PO LIQD CUSTOM
1.0000 | Freq: Three times a day (TID) | ORAL | Status: DC
  Administered 2022-01-10 – 2022-01-11 (×3): 1 via ORAL

## 2022-01-10 MED ORDER — MAGNESIUM SULFATE 4 GM/100ML IV SOLN
4.0000 g | Freq: Once | INTRAVENOUS | Status: AC
  Administered 2022-01-10: 4 g via INTRAVENOUS
  Filled 2022-01-10: qty 100

## 2022-01-10 MED ORDER — PHENOL 1.4 % MT LIQD: 1.0000 | OROMUCOSAL | Status: DC | PRN

## 2022-01-10 NOTE — Progress Notes (Signed)
Initial Nutrition Assessment  DOCUMENTATION CODES:   Severe malnutrition in context of chronic illness  INTERVENTION:   - Plan for Cortrak placement today by Cortrak team  - Boost Breeze po TID, each supplement provides 250 kcal and 9 grams of protein  - MVI with minerals daily  Initiate nocturnal tube feeds via Cortrak: - Vital 1.5 @ 40 ml/hr x 12 hours from 1800 to 0600 (total of 480 ml)  Nocturnal tube feeding regimen provides 720 kcal, 32 grams of protein, and 367 ml of H2O (meets 46% of minimum kcal needs and 43% of minimum protein needs).  - RD will monitor for tube feeding tolerance and ability to increase nocturnal tube feeding regimen  Monitor magnesium, potassium, and phosphorus BID for at least 3 days, MD to replete as needed, as pt is at risk for refeeding syndrome given severe malnutrition, inconsistent PO intake.  NUTRITION DIAGNOSIS:   Severe Malnutrition related to chronic illness (CHF, COPD) as evidenced by severe fat depletion, severe muscle depletion, percent weight loss (8.4% weight loss in less than 4 months).  GOAL:   Patient will meet greater than or equal to 90% of their needs  MONITOR:   PO intake, Supplement acceptance, Labs, Weight trends, TF tolerance, I & O's, Skin  REASON FOR ASSESSMENT:   Consult Assessment of nutrition requirement/status  ASSESSMENT:   81 year old female who presented to the ED on 1/30 with SOB. PMH of CHF, atrial fibrillation, COPD, HLD, anemia, GERD, CKD stage III, ischemic colon s/p hemicolectomy and ileostomy. Pt admitted with acute on chronic HF, AKI.  02/02 - s/p R/L heart cath 02/03 - s/p TEE-DCCV  Pt with severe MR. Noted plan for MitraClip on Thursday.  Spoke with pt and husband at bedside. Pt reports ever since she had her ileostomy surgery in 2017, her appetite has been worse and she has had taste changes. Pt has since had her ileostomy reversed. Pt reports that she has had to do smaller, more frequent meals  (4-5 small meals daily) since having this procedure. Pt's husband prepares or purchases whatever pt feels like eating but her preferences can change from day-to-day. During hospital admission, pt has been eating poorly and does not like the food on the Heart Healthy diet. For example, pt just ate bowl of oatmeal for breakfast this AM. Pt also reports that since her surgery, she has had chronic diarrhea that is managed fairly with OTC anti-diarrheals at home.  Pt's husband reports that pt typically weighs between 120-123 lbs. Current weight is 118 lbs. Reviewed weight history in chart. Pt has had a 4.9 kg weight loss since 09/28/21. This is an 8.4% weight loss in less than 4 months which is severe and significant for timeframe. Pt meets criteria for severe malnutrition.  Pt willing to try Cortrak placement for nocturnal tube feeds to optimize nutrition pre-op. Discussed process at length with pt and husband. Discussed with MD who agreed. Order placed for Cortrak and nocturnal tube feeds.  MD also okay with liberalizing diet to Regular. Boost Breeze ordered TID between meals to optimize PO intake. Pt reports Ensure Enlive or other types of milky supplements make her diarrhea worse.  RD will start pt off with lower rate nocturnal tube feeds and monitor for tolerance and affect on diarrhea. Pt at risk for refeeding. Will monitor for ability to increase nocturnal tube feeding rate.  Admit weight: 52.2 kg Current weight: 53.6 kg  Meal Completion: 20-100% x last 6 meals  Medications reviewed and include: ferrous  sulfate, IV lasix, protonix, amiodarone drip, heparin drip, IV magnesium sulfate 4 grams once  Labs reviewed: sodium 133, creatinine 1.34, ionized calcium 1.00, magnesium 1.5, prealbumin 11.1  NUTRITION - FOCUSED PHYSICAL EXAM:  Flowsheet Row Most Recent Value  Orbital Region Severe depletion  Upper Arm Region Moderate depletion  Thoracic and Lumbar Region Moderate depletion  Buccal Region  Severe depletion  Temple Region Moderate depletion  Clavicle Bone Region Severe depletion  Clavicle and Acromion Bone Region Severe depletion  Scapular Bone Region Moderate depletion  Dorsal Hand Moderate depletion  Anterior Thigh Region Severe depletion  Posterior Calf Region Moderate depletion  Edema (RD Assessment) Mild  [BLE]  Hair Reviewed  Eyes Reviewed  Mouth Reviewed  Skin Reviewed  Nails Reviewed       Diet Order:   Diet Order             Diet regular Room service appropriate? Yes; Fluid consistency: Thin  Diet effective now                   EDUCATION NEEDS:   Education needs have been addressed  Skin:  Skin Assessment: Skin Integrity Issues: Stage I: bilateral ischial tuberosity Incisions: R wrist  Last BM:  01/08/22 type 7  Height:   Ht Readings from Last 1 Encounters:  01/05/22 5\' 4"  (1.626 m)    Weight:   Wt Readings from Last 1 Encounters:  01/10/22 53.6 kg    BMI:  Body mass index is 20.28 kg/m.  Estimated Nutritional Needs:   Kcal:  1550-1750  Protein:  75-85 grams  Fluid:  1.5-1.7 L    Gustavus Bryant, MS, RD, LDN Inpatient Clinical Dietitian Please see AMiON for contact information.

## 2022-01-10 NOTE — Procedures (Signed)
Cortrak  Person Inserting Tube:  [name], RD Tube Type:  Cortrak - 43 inches Tube Size:  10 Tube Location:  Left nare Initial Placement:  Stomach Secured by: Bridle Technique Used to Measure Tube Placement:  Marking at nare/corner of mouth Cortrak Secured At:  67 cm  Cortrak Tube Team Note:  Consult received to place a Cortrak feeding tube.   X-ray is required, abdominal x-ray has been ordered by the Cortrak team. Please confirm tube placement before using the Cortrak tube.   If the tube becomes dislodged please keep the tube and contact the Cortrak team at [email or web address] (password [email or web address]) for replacement.  If after hours and replacement cannot be delayed, place a NG tube and confirm placement with an abdominal x-ray.    Lockie Pares., RD, LDN, CNSC See AMiON for contact information

## 2022-01-10 NOTE — Evaluation (Signed)
Occupational Therapy Evaluation Patient Details Name: [name] MRN: [id number] DOB: 1941-03-13 Today's Date: 01/10/2022   History of Present Illness Patient is a 81 y/o female who presents on 12/16/2021 with weakness, fluttering in chest and SOB. CXR- bil pulmonary edema. Found to have acute on chronic CHF, A-fib with RVR and severe mitral regurgitation. PMH includes PAF, moderate AS, COPD, quit smoking 25 years ago, PAD, HTN, CVA, NICM, tobacco abuse, ischemic bowel with resection in 2017,11/04/22 left ureteral calculus S/P utreteroscopy and stent and chronic HFrEF.   Clinical Impression   PTA patient was living with her spouse in a private residence and was grossly I to Mod I with ADLs/IADLs with intermittent use of SPC. Patient currently functioning below baseline demonstrating observed ADLs with supervision to Min A overall. Patient also limited by deficits listed below including decreased cardiopulmonary status and generalized weakness and would benefit from continued acute OT services in prep for safe d/c home with family. OT will continue to follow acutely.      Recommendations for follow up therapy are one component of a multi-disciplinary discharge planning process, led by the attending physician.  Recommendations may be updated based on patient status, additional functional criteria and insurance authorization.   Follow Up Recommendations  No OT follow up    Assistance Recommended at Discharge PRN  Patient can return home with the following      Functional Status Assessment  Patient has had a recent decline in their functional status and demonstrates the ability to make significant improvements in function in a reasonable and predictable amount of time.  Equipment Recommendations  None recommended by OT    Recommendations for Other Services       Precautions / Restrictions Precautions Precautions: Fall Precaution Comments: Monitor vitals Restrictions Weight Bearing  Restrictions: No      Mobility Bed Mobility               General bed mobility comments: Seated in recliner upon entry.    Transfers Overall transfer level: Needs assistance Equipment used: None Transfers: Sit to/from Stand Sit to Stand: Min guard           General transfer comment: Assist for safety/lines      Balance Overall balance assessment: Needs assistance Sitting-balance support: No upper extremity supported, Feet supported Sitting balance-[name]: Good     Standing balance support: No upper extremity supported Standing balance-[name]: Fair                             ADL either performed or assessed with clinical judgement   ADL Overall ADL's : Needs assistance/impaired     Grooming: Set up;Sitting   Upper Body Bathing: Set up;Sitting   Lower Body Bathing: Min guard;Sit to/from stand   Upper Body Dressing : Set up;Sitting   Lower Body Dressing: Min guard;Sit to/from stand                       Vision   Vision Assessment?: No apparent visual deficits     Perception     Praxis      Pertinent Vitals/Pain Pain Assessment Pain Assessment: 0-10 Pain Score: 1  Pain Location: Nose (Cortrak placed this a.m.) Pain Descriptors / Indicators: Discomfort Pain Intervention(s): Monitored during session     Hand Dominance Right   Extremity/Trunk Assessment Upper Extremity Assessment Upper Extremity Assessment: Generalized weakness   Lower Extremity Assessment Lower Extremity  Assessment: Defer to PT evaluation       Communication Communication Communication: No difficulties   Cognition Arousal/Alertness: Awake/alert Behavior During Therapy: Anxious Overall Cognitive Status: Within Functional Limits for tasks assessed                                       General Comments  HR 104bpm at rest. 110's with mild activity. Spo2 92-94% throughout.    Exercises Exercises: General Lower  Extremity General Exercises - Lower Extremity Straight Leg Raises: AROM, Both, 10 reps Hip Flexion/Marching: AROM, Both, 10 reps Toe Raises: AROM, Both, 10 reps Heel Raises: AROM, Both, 10 reps   Shoulder Instructions      Home Living Family/patient expects to be discharged to:: Private residence Living Arrangements: Spouse/significant other Available Help at Discharge: Family;Available 24 hours/day Type of Home: House Home Access: Stairs to enter [employer] of Steps: 5 Entrance Stairs-Rails: Right Home Layout: One level     Bathroom Shower/Tub: Occupational psychologist: Handicapped height     Home Equipment: [place] - single point          Prior Functioning/Environment Prior Level of Function : Independent/Modified Independent             Mobility Comments: Using cane recently          OT Problem List: Decreased strength;Decreased activity tolerance;Impaired balance (sitting and/or standing);Cardiopulmonary status limiting activity      OT Treatment/Interventions: Self-care/ADL training;Therapeutic exercise;Energy conservation;DME and/or AE instruction;Therapeutic activities;Patient/family education;Balance training    OT Goals(Current goals can be found in the care plan section) Acute Rehab OT Goals Patient Stated Goal: To feel better. OT Goal Formulation: With patient Time For Goal Achievement: 01/24/22 Potential to Achieve Goals: Good ADL Goals Pt Will Perform Grooming: with modified independence;standing Pt Will Perform Upper Body Dressing: with modified independence Pt Will Perform Lower Body Dressing: with modified independence;sit to/from stand Pt Will Transfer to Toilet: with modified independence;ambulating Pt Will Perform Toileting - Clothing Manipulation and hygiene: with modified independence;sit to/from stand Pt/caregiver will Perform Home Exercise Program: Increased strength;Both right and left upper  extremity;Independently Additional ADL Goal #1: Patient will tolerate therapeutic activity for 8-10 minutes without need for rest break or significant change in vitals.  OT Frequency: Min 3X/week    Co-evaluation              AM-PAC OT "6 Clicks" Daily Activity     Outcome Measure Help from another person eating meals?: None Help from another person taking care of personal grooming?: A Little Help from another person toileting, which includes using toliet, bedpan, or urinal?: A Little Help from another person bathing (including washing, rinsing, drying)?: A Little Help from another person to put on and taking off regular upper body clothing?: A Little Help from another person to put on and taking off regular lower body clothing?: A Little 6 Click Score: 19   End of Session Nurse Communication: Mobility status  Activity Tolerance: Patient tolerated treatment well;Patient limited by fatigue Patient left: in chair;with call bell/phone within reach;with family/visitor present  OT Visit Diagnosis: Unsteadiness on feet (R26.81);Muscle weakness (generalized) (M62.81)                Time: 8676-7209 OT Time Calculation (min): 16 min Charges:  OT General Charges $OT Visit: 1 Visit OT Evaluation $OT Eval Low Complexity: 1 Low  Axxel Gude H. OTR/L Supplemental OT,  Department of rehab services [phone]  Dane Bloch R H. 01/10/2022, 1:36 PM

## 2022-01-10 NOTE — Progress Notes (Signed)
Physical Therapy Treatment Patient Details Name: [name] MRN: [id number] DOB: 01-Nov-1941 Today's Date: 01/10/2022   History of Present Illness Patient is a 81 y/o female who presents on 12/31/2021 with weakness, fluttering in chest and SOB. CXR- bil pulmonary edema. Found to have acute on chronic CHF, A-fib with RVR and severe mitral regurgitation.s/p cardioversion 2/3. Plan for possible valve repair 2/9 pending nutrition. PMH includes PAF, moderate AS, COPD, quit smoking 25 years ago, PAD, HTN, CVA, NICM, tobacco abuse, ischemic bowel with resection in 2017,11/04/22 left ureteral calculus S/P utreteroscopy and stent and chronic HFrEF.    PT Comments    Patient progressing slowly towards PT goals. Session focused on gait training with use of rollator this session. Pt requires Min A for balance/line management during ambulation and cues for proper use of rollator. Fatigues quickly needing standing rest break. Sp02 dropped to 85% on 3L/min 02 [place] but recovers quickly with rest and cues for pursed lip breathing. Will continue to follow and progress slowly/as able to improve overall strength, mobility and endurance.    Recommendations for follow up therapy are one component of a multi-disciplinary discharge planning process, led by the attending physician.  Recommendations may be updated based on patient status, additional functional criteria and insurance authorization.  Follow Up Recommendations  No PT follow up     Assistance Recommended at Discharge Intermittent Supervision/Assistance  Patient can return home with the following Help with stairs or ramp for entrance;A little help with walking and/or transfers;Assistance with cooking/housework;Assist for transportation   Equipment Recommendations  Other (comment) (TBA)    Recommendations for Other Services       Precautions / Restrictions Precautions Precautions: Fall;Other (comment) Precaution Comments: watch 02 Restrictions Weight  Bearing Restrictions: No     Mobility  Bed Mobility               General bed mobility comments: Seated in recliner upon entry.    Transfers Overall transfer level: Needs assistance Equipment used: Rolling walker (2 wheels), Rollator (4 wheels) Transfers: Sit to/from Stand Sit to Stand: Min guard, Min assist           General transfer comment: Min guard-Min A for steadying assist in standing and to manage lines. Stood from chair x2, from [facility] x1.    Ambulation/Gait Ambulation/Gait assistance: Min assist Gait Distance (Feet): 64 Feet Assistive device: Rollator (4 wheels) Gait Pattern/deviations: Step-through pattern, Trunk flexed Gait velocity: decr     General Gait Details: Slow, long step lengths bilaterally with 3/4 DOE. Sp02 dropped to 85% on 3L/min 02 [place]. 1 standing rest break. "that wore me out."   Stairs             Wheelchair Mobility    Modified Rankin (Stroke Patients Only)       Balance Overall balance assessment: Needs assistance Sitting-balance support: No upper extremity supported, Feet supported Sitting balance-[name]: Good     Standing balance support: During functional activity Standing balance-[name]: Fair Standing balance comment: Able to stand and perform pericare without difficulty, min guard for safety.                            Cognition Arousal/Alertness: Awake/alert Behavior During Therapy: Anxious Overall Cognitive Status: Within Functional Limits for tasks assessed  Exercises      General Comments General comments (skin integrity, edema, etc.): Sp02 ranged from 85-94% on 3L/min 02 [place]. Spouse and family member present.      Pertinent Vitals/Pain Pain Assessment Pain Assessment: No/denies pain    Home Living Family/patient expects to be discharged to:: Private residence Living Arrangements: Spouse/significant other Available Help  at Discharge: Family;Available 24 hours/day Type of Home: House Home Access: Stairs to enter Entrance Stairs-Rails: Right Entrance Stairs-Number of Steps: 5   Home Layout: One level Home Equipment: Cane - single point      Prior Function            PT Goals (current goals can now be found in the care plan section) Progress towards PT goals: Progressing toward goals    Frequency    Min 3X/week      PT Plan Current plan remains appropriate    Co-evaluation              AM-PAC PT "6 Clicks" Mobility   Outcome Measure  Help needed turning from your back to your side while in a flat bed without using bedrails?: None Help needed moving from lying on your back to sitting on the side of a flat bed without using bedrails?: None Help needed moving to and from a bed to a chair (including a wheelchair)?: A Little Help needed standing up from a chair using your arms (e.g., wheelchair or bedside chair)?: A Little Help needed to walk in hospital room?: A Little Help needed climbing 3-5 steps with a railing? : A Little 6 Click Score: 20    End of Session Equipment Utilized During Treatment: Oxygen Activity Tolerance: Patient limited by fatigue Patient left: in bed;with call bell/phone within reach;with family/visitor present Nurse Communication: Mobility status PT Visit Diagnosis: Unsteadiness on feet (R26.81);Muscle weakness (generalized) (M62.81)     Time: 7824-2353 PT Time Calculation (min) (ACUTE ONLY): 21 min  Charges:  $Gait Training: 8-22 mins                     Marisa Severin, PT, DPT Acute Rehabilitation Services Pager [phone] Office [place] 01/10/2022, 3:41 PM

## 2022-01-10 NOTE — Progress Notes (Signed)
ANTICOAGULATION CONSULT NOTE - Follow Up Consult  Pharmacy Consult for heparin Indication: atrial fibrillation  Labs: Recent Labs    01/08/22 0540 01/08/22 0740 01/09/22 0609 01/09/22 1421 01/09/22 1740 01/09/22 2236 01/10/22 0520 01/10/22 0544  HGB 10.6*  --  10.5*  --   --   --  10.4*  --   HCT 34.0*  --  33.3*  --   --   --  33.9*  --   PLT 182  --  151  --   --   --  169  --   HEPARINUNFRC  --    < > 0.23*   < > >1.10* 0.40  --  0.29*  CREATININE 1.49*  --  1.41*  --   --   --  1.34*  --    < > = values in this interval not displayed.    Assessment: 81yo female who was on apix PTA 2.5mg  BID for a fib that was left off med rec and not continued on admit to ED on 1/30. Patient underwent TEE/DCCV 2/3 with plans for MitraClip.   Heparin level this morning came back slightly subtherapeutic at 0.29 on 1200 units/hr. Running central and being drawn drawn - will redraw as peripheral stick with plans to change heparin infusion to peripheral so can draw central without concerns. Repeat heparin level came back therapeutic at 0.32. CBC stable. No infusion issues or s/sx of bleeding per RN.   Goal of Therapy:  Heparin level 0.3-0.7 units/ml   Plan:  Continue heparin gtt at 1200 units/hr.  Daily HL and CBC while on heparin F/u plans for MitraClip  Antonietta Jewel, PharmD, BCCCP Clinical Pharmacist  Phone: [phone] 01/10/2022 11:59 AM  Please check AMION for all [place] phone numbers After 10:00 PM, call [place] [phone]

## 2022-01-10 NOTE — Progress Notes (Addendum)
Advanced Heart Failure Rounding Note  PCP-Cardiologist: Will Meredith Leeds, MD   Subjective:   2/1 A fib RVR--> Started on amio drip + heparin drip. PICC placed. Diuretics held.  2/2 RHC/LHC - Had cath with preserved cardiac output, min nonob CAD, elevated PCWP suggestive of severe MR.  2/4 TEE/DC-CV  LVEF 15% RV severely HK. DC-CV to NSR  AF with RVR yesterday. Rebolused with IV amio and increased lasix gtt to 60/hr. Back in SR yesterday afternoon/this am.  Co-ox 45% -> 57% -> 61%->53.7%. CVP 7-8.  No UOP charted.  Remains weak. Dyspnea improving.   SCr stable at 1.3.   Family says she is not eating well. Prealbumin 11. Apparently had acute embolic even to colon in 2017 and had a R hemicolectomy in 2017 at [place]. Had colostomy x 3 months. Has lost > 30 pounds since. RD consulted.    Objective:   Weight Range: 53.6 kg Body mass index is 20.28 kg/m.   Vital Signs:   Temp:  [97.4 F (36.3 C)-98.2 F (36.8 C)] 98 F (36.7 C) (02/06 0430) Pulse Rate:  [93-101] 100 (02/06 0430) Resp:  [20-22] 20 (02/06 0430) BP: (93-113)/(64-76) 106/76 (02/06 0430) SpO2:  [92 %-99 %] 93 % (02/06 0430) Weight:  [53.6 kg] 53.6 kg (02/06 0430) Last BM Date: 01/08/22  Weight change: Filed Weights   01/08/22 0329 01/09/22 0352 01/10/22 0430  Weight: 53.5 kg 53 kg 53.6 kg    Intake/Output:   Intake/Output Summary (Last 24 hours) at 01/10/2022 0741 Last data filed at 01/10/2022 0500 Gross per 24 hour  Intake 1537.38 ml  Output --  Net 1537.38 ml      Physical Exam   General:  Frail, chronically ill appearing. HEENT: normal Neck: supple. JVP 7-8. Carotids 2+ bilat; no bruits. No lymphadenopathy or thryomegaly appreciated. Cor: PMI nondisplaced. Regular rate & rhythm. No rubs, gallops or murmurs. Lungs: O2 stable on 2L [place], scattered wheezes Abdomen: soft, nontender, nondistended.  Extremities: no cyanosis, clubbing, rash, edema Neuro: alert & orientedx3, cranial nerves  grossly intact. moves all 4 extremities w/o difficulty. Affect pleasant    Telemetry   SR 80s-90s + PVCs (personally reviewed)   Labs    CBC Recent Labs    01/09/22 0609 01/10/22 0520  WBC 6.0 6.4  NEUTROABS 4.4 4.9  HGB 10.5* 10.4*  HCT 33.3* 33.9*  MCV 83.9 86.0  PLT 151 485   Basic Metabolic Panel Recent Labs    01/09/22 0609 01/10/22 0520  NA 137 133*  K 3.4* 4.3  CL 97* 96*  CO2 30 26  GLUCOSE 109* 225*  BUN 15 15  CREATININE 1.41* 1.34*  CALCIUM 9.0 8.6*  MG  --  1.5*   Liver Function Tests No results for input(s): AST, ALT, ALKPHOS, BILITOT, PROT, ALBUMIN in the last 72 hours.  No results for input(s): LIPASE, AMYLASE in the last 72 hours. Cardiac Enzymes No results for input(s): CKTOTAL, CKMB, CKMBINDEX, TROPONINI in the last 72 hours.  BNP: BNP (last 3 results) Recent Labs    12/10/2021 1737  BNP 2,812.3*    ProBNP (last 3 results) No results for input(s): PROBNP in the last 8760 hours.   D-Dimer No results for input(s): DDIMER in the last 72 hours. Hemoglobin A1C No results for input(s): HGBA1C in the last 72 hours. Fasting Lipid Panel No results for input(s): CHOL, HDL, LDLCALC, TRIG, CHOLHDL, LDLDIRECT in the last 72 hours. Thyroid Function Tests No results for input(s): TSH, T4TOTAL, T3FREE, THYROIDAB  in the last 72 hours.  Invalid input(s): FREET3   Other results:   Imaging    No results found.   Medications:     Scheduled Medications:  buPROPion  300 mg Oral Daily   Chlorhexidine Gluconate Cloth  6 each Topical Daily   colestipol  1 g Oral BID   ezetimibe  10 mg Oral Daily   ferrous sulfate  325 mg Oral Q breakfast   fluticasone  1 spray Each Nare BID   furosemide  80 mg Intravenous Daily   hydroxychloroquine  400 mg Oral QHS   levothyroxine  25 mcg Oral QAC breakfast   loratadine  10 mg Oral Daily   montelukast  10 mg Oral Daily   pantoprazole  40 mg Oral QHS   sodium chloride flush  10-40 mL Intracatheter  Q12H   sodium chloride flush  3 mL Intravenous Q12H    Infusions:  sodium chloride     amiodarone 60 mg/hr (01/10/22 0624)   heparin 1,200 Units/hr (01/10/22 0046)    PRN Medications: sodium chloride, acetaminophen, guaiFENesin-dextromethorphan, ipratropium, levalbuterol, loperamide, melatonin, ondansetron (ZOFRAN) IV, sodium chloride flush, sodium chloride flush, traZODone    Patient Profile   [name] is a 81 year old with a history of PAF, moderate AS, severe MR, COPD, quit smoking 25 years ago, PAD, HTN, hyperlipidemia, CVA, NICM, tobacco abuse, ischemic bowel with resection in 2017,11/04/22 left ureteral calculus S/P utreteroscopy and stent,  and chronic HFrEF.     Admitted A/C HFrEF and A fib RVR.   Assessment/Plan   A/C HFrEF - Echo this admit EF down to 25% from previous Echo EF 40-45% in 2018 .  BNP on admit > 2800.  - Has LBBB, may eventually benefit from CRT depending on progress - Cath 2/2 with minimal CAD preserved cardiac output, elevated PCWP (intermittent v-waves up to 50) suggestive of severe MR.  - TEE 2/3 LVEF 15% RV severe HK. Severe MR - CVP 7-8. Need to keep lungs dry. Continue lasix 80 IV daily for now. Strict Is/Os. - No room for GDMT with low bp.    2. Severe MR  - 12/28/21 TEE with severe MR - Structural heart has consulted  - Possible MitraClip this Thursday -Multidisciplinary team meeting today to discuss plan   3. Persistent AF - Last dose of eliquis 12/08/2021 (morning) -> heparin - s/p DC-CV 2/3 - SR this am on amio at 60/hr, will continue   4. AKI  - Recently had diuretics increased on 1/5. Creatinine on that time was 1.2.  - On admit creatinine was 1.7, today 1.34   5. A/C Respiratory Failure - On exam has severe underlying COPD - On triple inhalers and recently placed on oxygen 12/17/21.  - Continue nebs. Pulmonary toilet - improved with diuresis   6. Anemia  - Hgb 10.4. Iron Sats 7%.  - Received feraheme 02/03  7. Hypokalemia -  supp as needed with diuresis - K 4.3 today  8. 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 Nighttime core-trak feeds?   Overall very tenuous with severe HF, mitral regurgitation, severe malnutrition  and severe underlying lung disease.   Length of Stay: 7  [facility], [place], PA-C  01/10/2022, 7:41 AM  Advanced Heart Failure Team Pager [phone] (M-F; 7a - 5p)  Please contact [facility] for night-coverage after hours (5p -  7a ) and weekends on [email or web address]   Patient seen and examined with the above-signed Advanced Practice Provider and/or Housestaff. I personally reviewed laboratory data, imaging studies and relevant notes. I independently examined the patient and formulated the important aspects of the plan. I have edited the note to reflect any of my changes or salient points. I have personally discussed the plan with the patient and/or family.  Back in NSR on IV amio at 60. CVP 5-6. Breathing better but still very anxious and weak.   General:  Weak appearing. No resp difficulty HEENT: normal Neck: supple.JVP 5-6  Cor: PMI nondisplaced. Tachy regular . No rubs, gallops or murmurs. Lungs: coarse Abdomen: soft, nontender, nondistended. No hepatosplenomegaly. No bruits or masses. Good bowel sounds. Extremities: no cyanosis, clubbing, rash, edema Neuro: alert & orientedx3, cranial nerves grossly intact. moves all 4 extremities w/o difficulty. Affect pleasant  She remains very tenuous. Now back in NSR. Her decompensation is likely multifactorial - age, lung disease, poor nutrition, deconditioning, HF and AF.   Based on cath her MR is playing a significant role in her poor hemodynamics but the question is whether fixing the valve will help her overall trajectory.   I spoke with Dr. Ali Lowe and we both think that fixing the valve may  help her but she would get the most benefit if we can also address some of her other issues.  For now will keep her on schedule for MitraClip on Thursday and have her seen by PT/OT and Nutrition. (Consider night tube feeds with Cor-trak). If needed, we can consider bumping the Clip back a few weeks to give her more time. We will see how she does over the next 48 hours.   Lauren Bickers, MD  8:24 AM

## 2022-01-10 NOTE — Progress Notes (Addendum)
HPI: [name] is a 81 y.o. female with medical history significant for chronic systolic/diastolic heart failure, with most recent LVEF 25 to 30%, paroxysmal atrial fibrillation complicated by sick sinus syndrome, COPD, hyperlipidemia, anemia of chronic disease, who is admitted to [facility] on 12/23/2021 with acute on chronic systolic/diastolic heart failure after presenting from home to [facility] ED complaining of shortness of breath.   The patient reports 4 to 5 days of progressive shortness of breath associated orthopnea and worsening of edema in the bilateral lower extremities.  She reports associated nonproductive cough in the absence of any associated subjective fever, chills, rigors, or generalized myalgias.  Denies any associated chest pain, palpitations, diaphoresis, nausea, vomiting, dizziness, presyncope, or syncope.  Denies any new onset calf tenderness or new lower extremity erythema.  No hemoptysis.  Also denies any recent melena or hematochezia.  No recent rhinitis, rhinorrhea, sore throat, wheezing, rash.  She reports good compliance with her outpatient diuretic regimen which consists of Lasix 40 mg p.o. daily.  She confirms a history of chronic systolic/diastolic heart failure, with most recent echocardiogram from 11/26/2021 notable for LVEF 25 to 30% with global left ventricular hypokinesis, grade 2 diastolic dysfunction.  Per chart review, it appears that the nature of her chronic heart failure is felt to be on the basis of nonischemic cardiomyopathy.  She also has a history of COPD acknowledging that she is a former smoker, and completely quit smoking approximately 25 years ago.  She follows with pulmonology as an outpatient, with recent associated appointment revealing pulmonology's opinion that her progressive shortness of breath is more cardiac than pulmonary in primary origin at this point.   Her diuresis as an outpatient has been complicated by intermittent hypotension.   Additionally, per chart review, her baseline serum creatinine appears to be 1.2-1.3, with most recent prior value noted to be 1.17 on 12/09/2021.   ED Course:  Vital signs in the ED were notable for the following: Afebrile; heart rate 93-1 05; blood pressure 87/62 - 106/82, respiratory rate 14-24, oxygen saturation 97 to 100% on room air.  Labs were notable for the following: CMP was notable for the following: Sodium 135, potassium 4.1, creatinine 1.73.  High-sensitivity troponin I x2 values were both found to be 13.  BNP 2800 compared to most recent prior value of 340 in 2017.  CBC notable for episode count 5200, hemoglobin 10.8 relative most recent prior value of 12 in April 2022.  Urinalysis showed no white blood cells, no red blood cells, and was negative for protein.  COVID-19/influenza PCR found to be negative.  Imaging and additional notable ED work-up: EKG shows atrial fibrillation with interventricular conduction delay, heart rate 108, no evidence of T wave or ST changes, including no evidence of ST elevation.  Chest x-ray, in comparison to most recent prior chest x-ray from 12/09/2021 showed interval worsening of bilateral pulmonary edema as well as interval increase in size of the bilateral pleural effusions, while demonstrating no evidence of infiltrate or pneumothorax.  EDP discussed the patient's case with on-call cardiology, who will formally consult, with additional recommendations pending at this time.  While in the ED, the following were administered: Lasix 80 mg IV x1.  Subsequently, the patient was admitted to the PCU for further evaluation management of presenting acute on chronic systolic/diastolic heart failure complicated by borderline hypotension as well as acute kidney injury.  Subjective Her shortness of breath is improved over the last several days.  She is still  very anxious concerning possible surgery on Thursday.   Objective    Physical Exam: Vitals:   01/10/22  0040 01/10/22 0100 01/10/22 0430 01/10/22 0849  BP: 95/67  106/76 99/66  Pulse: (!) 101 98 100 87  Resp: (!) 22 (!) 21 20 19   Temp:  97.9 F (36.6 C) 98 F (36.7 C) 98.1 F (36.7 C)  TempSrc:  Oral Oral Oral  SpO2: 93% 99% 93% 93%  Weight:   53.6 kg   Height:        General: appears to be stated age; alert, oriented; no increased work of breathing Skin: warm, dry, no rash Head:  AT/[place] Mouth:  Oral mucosa membranes appear moist, normal dentition Neck: supple; trachea midline Heart:  RRR; did not appreciate any M/R/G Lungs: Bibasilar crackles noted, but otherwise CTAB, did not appreciate any wheezes,or rhonchi Abdomen: + BS; soft, ND, NT Vascular: 2+ pedal pulses b/l; 2+ radial pulses b/l Extremities: 1+ edema in the bilateral lower extremities, no muscle wasting Neuro: strength and sensation intact in upper and lower extremities b/l     Labs on Admission: I have personally reviewed following labs and imaging studies  CBC: Recent Labs  Lab 01/25/2022 0130 01/28/2022 1351 01/26/2022 1355 01/31/2022 0500 01/08/22 0540 01/09/22 0609 01/10/22 0520  WBC 6.3  --   --  5.0 6.0 6.0 6.4  NEUTROABS 4.0  --   --  3.9 4.3 4.4 4.9  HGB 10.9*   < > 11.6*   10.5* 10.8* 10.6* 10.5* 10.4*  HCT 34.3*   < > 34.0*   31.0* 34.2* 34.0* 33.3* 33.9*  MCV 83.7  --   --  84.0 84.2 83.9 86.0  PLT 178  --   --  163 182 151 169   < > = values in this interval not displayed.   Basic Metabolic Panel: Recent Labs  Lab 01/04/22 0316 01/05/22 1011 01/21/2022 0130 01/17/2022 1351 01/31/2022 1355 02/01/2022 0500 01/08/22 0540 01/09/22 0609 01/10/22 0520  NA 138   < > 137   < > 138   141 136 133* 137 133*  K 4.1   < > 3.9   < > 3.6   3.2* 3.9 3.7 3.4* 4.3  CL 101   < > 97*  --   --  98 94* 97* 96*  CO2 27   < > 32  --   --  29 29 30 26   GLUCOSE 83   < > 117*  --   --  119* 100* 109* 225*  BUN 29*   < > 21  --   --  17 18 15 15   CREATININE 1.58*   < > 1.37*  --   --  1.38* 1.49* 1.41* 1.34*  CALCIUM 8.9    < > 9.1  --   --  9.2 9.2 9.0 8.6*  MG 1.7  --  2.3  --   --   --   --   --  1.5*  PHOS 3.6  --   --   --   --   --   --   --   --    < > = values in this interval not displayed.   GFR: Estimated Creatinine Clearance: 27.4 mL/min (A) (by C-G formula based on SCr of 1.34 mg/dL (H)). Liver Function Tests: Recent Labs  Lab 12/21/2021 1913 01/04/22 0316  AST 19 19  ALT 18 19  ALKPHOS 88 92  BILITOT 0.4 0.6  PROT 5.4* 5.8*  ALBUMIN 3.2* 3.5   No results for input(s): LIPASE, AMYLASE in the last 168 hours. No results for input(s): AMMONIA in the last 168 hours. Coagulation Profile: No results for input(s): INR, PROTIME in the last 168 hours. Cardiac Enzymes: No results for input(s): CKTOTAL, CKMB, CKMBINDEX, TROPONINI in the last 168 hours. BNP (last 3 results) No results for input(s): PROBNP in the last 8760 hours. HbA1C: No results for input(s): HGBA1C in the last 72 hours. CBG: No results for input(s): GLUCAP in the last 168 hours. Lipid Profile: No results for input(s): CHOL, HDL, LDLCALC, TRIG, CHOLHDL, LDLDIRECT in the last 72 hours. Thyroid Function Tests: No results for input(s): TSH, T4TOTAL, FREET4, T3FREE, THYROIDAB in the last 72 hours.  Anemia Panel: No results for input(s): VITAMINB12, FOLATE, FERRITIN, TIBC, IRON, RETICCTPCT in the last 72 hours.  Urine analysis:    Component Value Date/Time   COLORURINE YELLOW 01/02/2022 2046   APPEARANCEUR CLEAR 12/08/2021 2046   LABSPEC 1.025 12/31/2021 2046   PHURINE 5.5 12/18/2021 2046   GLUCOSEU NEGATIVE 12/16/2021 2046   GLUCOSEU NEGATIVE 10/29/2019 1135   HGBUR NEGATIVE 12/23/2021 2046   BILIRUBINUR NEGATIVE 12/25/2021 2046   KETONESUR NEGATIVE 01/04/2022 2046   PROTEINUR NEGATIVE 12/07/2021 2046   UROBILINOGEN 0.2 10/29/2019 1135   NITRITE NEGATIVE 12/26/2021 2046   LEUKOCYTESUR NEGATIVE 12/21/2021 2046    Radiological Exams on Admission: No results found.   EKG: Independently reviewed, with result as  described above.    Assessment/Plan   Principal Problem:   Acute on chronic combined systolic and diastolic CHF (congestive heart failure) (HCC) Active Problems:   HLD (hyperlipidemia)   Atrial fibrillation (HCC)   COPD (chronic obstructive pulmonary disease) (HCC)   Allergic rhinitis   SOB (shortness of breath)   AKI (acute kidney injury) ([place])    #) Acute on chronic systolic/diastolic heart failure: dx of acute decompensation on the basis of presenting 4 to 5 days of progressive shortness of breath associate with orthopnea, worsening of peripheral edema, with chest x-ray showing interval worsening of bilateral pulmonary edema as well as interval increase in size of bilateral pleural effusions, as further described above. This is in the context of a known history of chronic systolic/diastolic heart failure, with most recent echocardiogram performed in December 2022, which was notable for LVEF 25 to 30%, global left ventricular hypokinesis, and grade 2 diastolic dysfunction. Etiology leading to presenting acutely decompensated heart failure unclear. Patient conveys good compliance with home diuretic therapy, which consists of Lasix 40 mg subcu daily, as well as good compliance with their additional home cardiac medications, which include metoprolol succinate.  Of note, outpatient diuresis efforts have been complicated by borderline hypotension, as further detailed above . overall, ACS leading to presenting acutely decompensated heart failure appears less likely at this time in the absence of any recent CP, presenting EKG showing no evidence of acute ischemic changes, as well as non-elevated troponin and spite of aforementioned duration of the patient's symptoms.   Of note, patient received Lasix 80 mg IV x1 while in the ED today. Presentation warrants additional IV diuresis, as further detailed below, with close monitoring of ensuing renal function, electrolytes,, volume status, and blood pressure  as further noted below. Will also closely monitor ensuing HR as an additional means to evaluate volume status and help guide subsequent diuresis decision-making. As patient is already on a BB at home, will plan to continue this.  Of note, cardiology has been consulted.  Of note, the patient is not  currently on an ACE inhibitor/ARB nor Isordil/hydralazine as an outpatient,  likely because of the aforementioned ongoing borderline soft blood pressures.    Plan:Holding diuresis and on Amio drip for rate control.  Appreciate cardiology input..  Severe mitral regurg may need clip at some point.  Beta-blocker stopped.  Amio started.  Status post left heart cath right heart cath followed by TEE-DC cardioversion.  For possible MR clip on Thursday   #) Acute kidney injury: Presenting serum creatinine 1.73 relative to her baseline range of 1.2-1.3, with prior value noted to be 1.17 on 12/09/2021.  Suspect that this is prerenal in nature, in the setting of diminished renal perfusion gradient as a consequence of acute on chronic systolic/diastolic heart failure, as above.  Of note, urinalysis with microscopy from this evening showed no evidence of white blood cell or red blood cell casts, nor any evidence of protein.  Plan: Further evaluation management of presenting acute on chronic systolic/diastolic heart failure, as above, including plan for IV diuresis.  Monitor strict I's and O's and daily weights.  Tempt avoid nephrotoxic agents.  Creatinine stabilized at around 1.4.  #) Paroxysmal atrial fibrillation: Documented history of such. In setting of CHA2DS2-VASc score of  8, there is an indication for chronic anticoagulation for thromboembolic prophylaxis.  It does not appear that she is chronically anticoagulated, and rationale for this is not currently clear to me at this time.  Home AV nodal blocking regimen: Outpatient beta-blocker.  Most recent echocardiogram performed in December 2022, as further detailed above.  Presenting EKG suggestive of rate controlled atrial fibrillation.  There is also documentation of complicating factor of sick sinus syndrome, for which she has been following with electrophysiology cardiology as an outpatient.  Plan: monitor strict I's & O's and daily weights.  Amiodarone drip.  Beta-blocker stopped.  Heparin drip.  Appears cardioversion was successful.  #) COPD: Documented history of such, with the patient acknowledging that she is a former smoker, and completely quit smoking approximately 25 years ago.  Follows close with outpatient pulmonology, who feel that patient's recent shortness of breath is more on the basis of heart failure then underlying COPD, as above.  Patient ports good compliance with outpatient respiratory regimen which includes scheduled Trelegy as well as prn albuterol inhaler.  Overall, no clinical evidence to suggest acute COPD exacerbation at this time.  Plan: Continue outpatient Trelegy as well as as needed albuterol.  Monitor continuous pulse oximetry.    #) Hyperlipidemia: documented h/o such.  In the setting of a documented history of statin intolerance, she is on Zetia as outpatient.   Plan: continue home Zetia.   #) Anemia of chronic disease: Documented history of such, with baseline hemoglobin appearing to be approximately 12, with presenting hemoglobin slightly lower than his baseline, with presenting value noted to be 10.8, potentially representing a hemodelusional contribution in the setting of suspected acute volume overload as a consequence of presenting acute on chronic systolic/diastolic heart failure, as above.  No evidence of active bleed at this time.  She is on daily oral iron supplementation as outpatient.  Plan: H&H have been stable  #) acquired hypothyroidism: documented h/o such, on Synthroid as outpatient.   Plan: cont home Synthroid.   #) GERD: documented h/o such; on omeprazole as outpatient.   Plan: continue home PPI.   Chronic  respiratory failure-on 3 L of oxygen at home chronically  Obtain physical therapy evaluation while in hospital.  Nutrition consult pending.   Sailor Haughn A MD  [facility]

## 2022-01-10 NOTE — Plan of Care (Signed)
°  Problem: Education: Goal: Knowledge of General Education information will improve Description: Including pain rating scale, medication(s)/side effects and non-pharmacologic comfort measures Outcome: Progressing   Problem: Health Behavior/Discharge Planning: Goal: Ability to manage health-related needs will improve Outcome: Progressing   Problem: Clinical Measurements: Goal: Respiratory complications will improve Outcome: Progressing   Problem: Activity: Goal: Risk for activity intolerance will decrease Outcome: Progressing   Problem: Nutrition: Goal: Adequate nutrition will be maintained Outcome: Progressing   Problem: Coping: Goal: Level of anxiety will decrease Outcome: Progressing   Problem: Skin Integrity: Goal: Risk for impaired skin integrity will decrease Outcome: Progressing   Problem: Elimination: Goal: Will not experience complications related to bowel motility Outcome: Not Applicable

## 2022-01-11 ENCOUNTER — Encounter: Payer: Self-pay | Admitting: Physician Assistant

## 2022-01-11 ENCOUNTER — Ambulatory Visit: Payer: Medicare Other | Admitting: Physician Assistant

## 2022-01-11 DIAGNOSIS — E43 Unspecified severe protein-calorie malnutrition: Secondary | ICD-10-CM | POA: Insufficient documentation

## 2022-01-11 DIAGNOSIS — I5043 Acute on chronic combined systolic (congestive) and diastolic (congestive) heart failure: Secondary | ICD-10-CM | POA: Diagnosis not present

## 2022-01-11 DIAGNOSIS — L8990 Pressure ulcer of unspecified site, unspecified stage: Secondary | ICD-10-CM | POA: Insufficient documentation

## 2022-01-11 DIAGNOSIS — I340 Nonrheumatic mitral (valve) insufficiency: Secondary | ICD-10-CM | POA: Diagnosis not present

## 2022-01-11 LAB — CBC WITH DIFFERENTIAL/PLATELET
Abs Immature Granulocytes: 0.05 10*3/uL (ref 0.00–0.07)
Basophils Absolute: 0 10*3/uL (ref 0.0–0.1)
Basophils Relative: 0 %
Eosinophils Absolute: 0.1 10*3/uL (ref 0.0–0.5)
Eosinophils Relative: 1 %
HCT: 32.7 % — ABNORMAL LOW (ref 36.0–46.0)
Hemoglobin: 10.1 g/dL — ABNORMAL LOW (ref 12.0–15.0)
Immature Granulocytes: 1 %
Lymphocytes Relative: 7 %
Lymphs Abs: 0.6 10*3/uL — ABNORMAL LOW (ref 0.7–4.0)
MCH: 26 pg (ref 26.0–34.0)
MCHC: 30.9 g/dL (ref 30.0–36.0)
MCV: 84.1 fL (ref 80.0–100.0)
Monocytes Absolute: 1.3 10*3/uL — ABNORMAL HIGH (ref 0.1–1.0)
Monocytes Relative: 16 %
Neutro Abs: 6.5 10*3/uL (ref 1.7–7.7)
Neutrophils Relative %: 75 %
Platelets: 181 10*3/uL (ref 150–400)
RBC: 3.89 MIL/uL (ref 3.87–5.11)
RDW: 17.7 % — ABNORMAL HIGH (ref 11.5–15.5)
WBC: 8.6 10*3/uL (ref 4.0–10.5)
nRBC: 0 % (ref 0.0–0.2)

## 2022-01-11 LAB — BASIC METABOLIC PANEL
Anion gap: 11 (ref 5–15)
BUN: 19 mg/dL (ref 8–23)
CO2: 27 mmol/L (ref 22–32)
Calcium: 8.5 mg/dL — ABNORMAL LOW (ref 8.9–10.3)
Chloride: 91 mmol/L — ABNORMAL LOW (ref 98–111)
Creatinine, Ser: 1.19 mg/dL — ABNORMAL HIGH (ref 0.44–1.00)
GFR, Estimated: 46 mL/min — ABNORMAL LOW (ref >60)
Glucose, Bld: 200 mg/dL — ABNORMAL HIGH (ref 70–99)
Potassium: 3.6 mmol/L (ref 3.5–5.1)
Sodium: 129 mmol/L — ABNORMAL LOW (ref 135–145)

## 2022-01-11 LAB — PHOSPHORUS: Phosphorus: 3.2 mg/dL (ref 2.5–4.6)

## 2022-01-11 LAB — GLUCOSE, CAPILLARY
Glucose-Capillary: 130 mg/dL — ABNORMAL HIGH (ref 70–99)
Glucose-Capillary: 102 mg/dL — ABNORMAL HIGH (ref 70–99)
Glucose-Capillary: 135 mg/dL — ABNORMAL HIGH (ref 70–99)
Glucose-Capillary: 109 mg/dL — ABNORMAL HIGH (ref 70–99)
Glucose-Capillary: 101 mg/dL — ABNORMAL HIGH (ref 70–99)

## 2022-01-11 LAB — MAGNESIUM: Magnesium: 2.3 mg/dL (ref 1.7–2.4)

## 2022-01-11 LAB — COOXEMETRY PANEL
Carboxyhemoglobin: 1.2 % (ref 0.5–1.5)
Carboxyhemoglobin: 1.1 % (ref 0.5–1.5)
Methemoglobin: 0.8 % (ref 0.0–1.5)
Methemoglobin: 1 % (ref 0.0–1.5)
O2 Saturation: 50.1 %
O2 Saturation: 57.3 %
Total hemoglobin: 10.5 g/dL — ABNORMAL LOW (ref 12.0–16.0)
Total hemoglobin: 9.9 g/dL — ABNORMAL LOW (ref 12.0–16.0)

## 2022-01-11 LAB — HEPARIN LEVEL (UNFRACTIONATED): Heparin Unfractionated: 0.27 IU/mL — ABNORMAL LOW (ref 0.30–0.70)

## 2022-01-11 MED ORDER — VITAL 1.5 CAL PO LIQD
1000.0000 mL | ORAL | Status: DC
  Administered 2022-01-11 – 2022-01-12 (×2): 1000 mL
  Filled 2022-01-11 (×3): qty 1000

## 2022-01-11 MED ORDER — ALPRAZOLAM 0.25 MG PO TABS
0.2500 mg | ORAL_TABLET | Freq: Two times a day (BID) | ORAL | Status: DC | PRN
  Administered 2022-01-11 – 2022-01-12 (×3): 0.25 mg via ORAL
  Filled 2022-01-11 (×3): qty 1

## 2022-01-11 MED ORDER — POTASSIUM CHLORIDE CRYS ER 20 MEQ PO TBCR
40.0000 meq | EXTENDED_RELEASE_TABLET | Freq: Once | ORAL | Status: AC
  Administered 2022-01-11: 40 meq via ORAL
  Filled 2022-01-11: qty 2

## 2022-01-11 MED ORDER — FUROSEMIDE 10 MG/ML IJ SOLN
80.0000 mg | Freq: Every day | INTRAMUSCULAR | Status: DC
  Administered 2022-01-11: 80 mg via INTRAVENOUS

## 2022-01-11 MED ORDER — MILRINONE LACTATE IN DEXTROSE 20-5 MG/100ML-% IV SOLN
0.1250 ug/kg/min | INTRAVENOUS | Status: DC
  Administered 2022-01-11 – 2022-01-18 (×4): 0.125 ug/kg/min via INTRAVENOUS
  Filled 2022-01-11 (×5): qty 100

## 2022-01-11 NOTE — Progress Notes (Signed)
Progress Note  Patient Name: [name] Date of Encounter: 01/11/2022  [facility] Cardiologist: Will Meredith Leeds, MD   Subjective   Started TF yesterday.  More dyspneic this AM with declining Co-ox and increasing CVP so started on milrinone and lasix.  Intermittent AF.    Inpatient Medications    Scheduled Meds:  buPROPion  300 mg Oral Daily   Chlorhexidine Gluconate Cloth  6 each Topical Daily   colestipol  1 g Oral BID   ezetimibe  10 mg Oral Daily   feeding supplement  1 Container Oral TID BM   feeding supplement (VITAL 1.5 CAL)  1,000 mL Per Tube Q24H   ferrous sulfate  325 mg Oral Q breakfast   fluticasone  1 spray Each Nare BID   furosemide  80 mg Intravenous Daily   furosemide  80 mg Intravenous Daily   hydroxychloroquine  400 mg Oral QHS   levothyroxine  25 mcg Oral QAC breakfast   loratadine  10 mg Oral Daily   montelukast  10 mg Oral Daily   multivitamin with minerals  1 tablet Oral Daily   pantoprazole  40 mg Oral QHS   sodium chloride flush  10-40 mL Intracatheter Q12H   sodium chloride flush  3 mL Intravenous Q12H   Continuous Infusions:  sodium chloride     amiodarone 60 mg/hr (01/11/22 0606)   heparin 1,200 Units/hr (01/10/22 1918)   milrinone 0.125 mcg/kg/min (01/11/22 0919)   PRN Meds: sodium chloride, acetaminophen, guaiFENesin-dextromethorphan, ipratropium, levalbuterol, loperamide, melatonin, ondansetron (ZOFRAN) IV, phenol, sodium chloride flush, sodium chloride flush, traZODone   Vital Signs    Vitals:   01/10/22 2356 01/11/22 0322 01/11/22 0704 01/11/22 0837  BP: (!) 92/56 (!) 89/58 105/74   Pulse: 84 85 97 99  Resp: 20 20 20 20   Temp: 97.6 F (36.4 C) 97.6 F (36.4 C) 98 F (36.7 C)   TempSrc: Oral Oral Oral   SpO2: 98% 94% 98% 90%  Weight:  54.5 kg    Height:        Intake/Output Summary (Last 24 hours) at 01/11/2022 1105 Last data filed at 01/11/2022 0606 Gross per 24 hour  Intake 1854.23 ml  Output 600 ml  Net  1254.23 ml   Last 3 Weights 01/11/2022 01/10/2022 01/09/2022  Weight (lbs) 120 lb 2.4 oz 118 lb 2.7 oz 116 lb 13.5 oz  Weight (kg) 54.5 kg 53.6 kg 53 kg      Telemetry    AF- Personally Reviewed  ECG    N/A- Personally Reviewed  Physical Exam   GEN: Moderate respiratory distress Neck: No JVD Cardiac: Irregular, tachycardic, no murmurs, rubs, or gallops.  Respiratory: Coarse with mild crackles GI: Soft, nontender, non-distended  MS: No edema; No deformity. Neuro:  Nonfocal  Psych: Normal affect   Labs    High Sensitivity Troponin:   Recent Labs  Lab 12/29/2021 1913 12/25/2021 2113  TROPONINIHS 13 13     Chemistry Recent Labs  Lab 01/18/2022 0130 01/27/2022 1351 01/09/22 0609 01/10/22 0520 01/11/22 0547  NA 137   < > 137 133* 129*  K 3.9   < > 3.4* 4.3 3.6  CL 97*   < > 97* 96* 91*  CO2 32   < > 30 26 27   GLUCOSE 117*   < > 109* 225* 200*  BUN 21   < > 15 15 19   CREATININE 1.37*   < > 1.41* 1.34* 1.19*  CALCIUM 9.1   < >  9.0 8.6* 8.5*  MG 2.3  --   --  1.5* 2.3  GFRNONAA 39*   < > 37* 40* 46*  ANIONGAP 8   < > 10 11 11    < > = values in this interval not displayed.    Lipids No results for input(s): CHOL, TRIG, HDL, LABVLDL, LDLCALC, CHOLHDL in the last 168 hours.  Hematology Recent Labs  Lab 01/09/22 0609 01/10/22 0520 01/11/22 0547  WBC 6.0 6.4 8.6  RBC 3.97 3.94 3.89  HGB 10.5* 10.4* 10.1*  HCT 33.3* 33.9* 32.7*  MCV 83.9 86.0 84.1  MCH 26.4 26.4 26.0  MCHC 31.5 30.7 30.9  RDW 17.1* 17.2* 17.7*  PLT 151 169 181   Thyroid No results for input(s): TSH, FREET4 in the last 168 hours.  BNPNo results for input(s): BNP, PROBNP in the last 168 hours.  DDimer No results for input(s): DDIMER in the last 168 hours.   Radiology    DG Abd Portable 1V  Result Date: 01/10/2022 CLINICAL DATA:  Feeding tube placement. EXAM: PORTABLE ABDOMEN - 1 VIEW COMPARISON:  September 02, 2016. FINDINGS: Distal tip of feeding tube is seen in expected position of distal stomach.  No abnormal bowel dilatation. IMPRESSION: Distal tip of feeding tube seen in expected position of distal stomach. Electronically Signed   By: Marijo Conception M.D.   On: 01/10/2022 11:45    Cardiac Studies   TEE 2/3 Reviewed  Patient Profile     Ms Turan is a 81 year old with a history of PAF, moderate AS, severe MR, COPD, quit smoking 25 years ago, PAD, HTN, hyperlipidemia, CVA, NICM, tobacco abuse, ischemic bowel with resection in 2017,11/04/22 left ureteral calculus S/P utreteroscopy and stent,  and chronic HFrEF.    Assessment & Plan     Acute on chronic systolic failure:  The patient continues to decline despite aggressive medical therapy.  Now on milrinone.  She is quite dyspneic today so hopefully ionotrope and lasix will temporize her.  I do not think she will do well without mTEER therapy.  She is certainly marginal given her advanced age, LV dysfunction (EF15-20%), and malnutrition.  Will refer for mTEER this Thursday. Severe MR:  as above, anatomy looks amenable to mTEER AF:  Transiently in and out of AF.  Cont hep gtt for now. AKI:  Resolved, cont IV diuretics Malnutrition:  On nocturnal TF, lasix to keep even or net negative.     For questions or updates, please contact [place] Please consult [email or web address] for contact info under        Signed, Early Osmond, MD  01/11/2022, 11:05 AM

## 2022-01-11 NOTE — Progress Notes (Signed)
Nutrition Follow-up  DOCUMENTATION CODES:   Severe malnutrition in context of chronic illness  INTERVENTION:   - Continue Boost Breeze po TID, each supplement provides 250 kcal and 9 grams of protein   - Continue MVI with minerals daily  - Continue liberalized Regular diet order   Continue nocturnal tube feeds via Cortrak: - Increase Vital 1.5 to 55 ml/hr x 12 hours from 1800 to 0600 (total of 660 ml)   Nocturnal tube feeding regimen provides 990 kcal, 45 grams of protein, and 504 ml of H2O (meets 64% of minimum kcal needs and 60% of minimum protein needs).    Continue to monitor magnesium, potassium, and phosphorus BID for at least 3 days total, MD to replete as needed, as pt is at risk for refeeding syndrome given severe malnutrition, inconsistent PO intake.  NUTRITION DIAGNOSIS:   Severe Malnutrition related to chronic illness (CHF, COPD) as evidenced by severe fat depletion, severe muscle depletion, percent weight loss (8.4% weight loss in less than 4 months).  Ongoing, being addressed via oral nutrition supplements and nocturnal tube feeds  GOAL:   Patient will meet greater than or equal to 90% of their needs  Progressing  MONITOR:   PO intake, Supplement acceptance, Labs, Weight trends, TF tolerance, I & O's, Skin  REASON FOR ASSESSMENT:   Consult Assessment of nutrition requirement/status  ASSESSMENT:   81 year old female who presented to the ED on 1/30 with SOB. PMH of CHF, atrial fibrillation, COPD, HLD, anemia, GERD, CKD stage III, ischemic colon s/p hemicolectomy and ileostomy. Pt admitted with acute on chronic HF, AKI.  02/02 - s/p R/L heart cath 02/03 - s/p TEE-DCCV 02/06 - Cortrak placed (tip gastric)  Plan remains for MitraClip on Thursday.  Spoke with pt and husband at bedside. Pt extremely anxious at time of RD visit. Pt reports that things are going horribly. She states that she slept very poorly last night despite receiving trazodone. Pt endorses  some nausea this morning but denies vomiting. Pt states that she did not having nausea overnight, just this morning. Pt was able to eat some ice cream this morning. Pt states that she drinks some of the Boost Breeze but that they are very sweet. Encouraged pt to let the supplement sit over ice for a while to water it down and make it more palatable. Pt agreeable to trying this.  Pt willing to increase nocturnal tube feeding rate to provide additional kcal and protein. Orders adjusted. Will continue to monitor refeeding labs.  Admit weight: 52.2 kg Current weight: 54.5 kg  Meal Completion: 20-100% x last 7 documented meals  Medications reviewed and include: colestid, wellbutrin, Boost Breeze TID, ferrous sulfate, IV lasix, MVI with minerals, protonix, amiodarone drip, milrinone drip, heparin drip  Labs reviewed: sodium 129, creatinine 1.19 CBG's: 102-196 x 24 hours  UOP: 600 ml x 24 hours I/O's: +4.7 L since admit  Diet Order:   Diet Order             Diet regular Room service appropriate? Yes; Fluid consistency: Thin  Diet effective now                   EDUCATION NEEDS:   Education needs have been addressed  Skin:  Skin Assessment: Skin Integrity Issues: Stage I: bilateral ischial tuberosity Incisions: R wrist  Last BM:  01/10/22  Height:   Ht Readings from Last 1 Encounters:  01/05/22 5\' 4"  (1.626 m)    Weight:   Wt Readings from  Last 1 Encounters:  01/11/22 54.5 kg    BMI:  Body mass index is 20.62 kg/m.  Estimated Nutritional Needs:   Kcal:  1550-1750  Protein:  75-85 grams  Fluid:  1.5-1.7 L    Gustavus Bryant, MS, RD, LDN Inpatient Clinical Dietitian Please see AMiON for contact information.

## 2022-01-11 NOTE — Progress Notes (Signed)
PROGRESS NOTE  [name]  DOB: 1941-03-03  PCP: Biagio Borg, MD TMA:[id number]  DOA: 12/26/2021  LOS: 8 days  Hospital Day: 9  Chief Complaint  Patient presents with   Atrial Fibrillation   Brief narrative: [name] is a 81 y.o. female with PMH significant for chronic systolic/diastolic heart failure, nonischemic cardiomyopathy with most recent LVEF 25 to 30%, paroxysmal atrial fibrillation complicated by sick sinus syndrome, COPD, hyperlipidemia, anemia of chronic disease. Patient presented to the ED on 12/20/2021 with progressively worsening shortness of breath for 4 to 5 days, stated with orthopnea, chronic bilateral lower extremity edema despite good compliance to her medications including Lasix.  In the ED, blood pressure was running low. Creatinine elevated to 1.73, BNP elevated to 2800 CBC with WBC count 5.2, hemoglobin 10.8 Urinalysis nonsuggestive of infection EKG with A-fib at the rate of 108, no evidence of ST-T wave changes Chest x-ray bilateral pulmonary edema, bilateral pleural effusion Admit to hospitalist service Cardiology consultation was obtained.  Diuretics was started  2/1, patient went to A-fib with RVR.  Patient was started on amiodarone and heparin drip. 2/2, patient underwent RHC/LHC.  Minimal nonobstructive CAD.  It also showed elevated PCWP suggestive of severe MR 2/4, patient underwent TEE/DC cardioversion.  It showed LVEF of 15%, RV severely hypokinetic.  DC cardioversion was successful to normal sinus rhythm  Subjective: Patient was seen and examined this morning.  Breathing heavily Caucasian female.  Seems weak.  Sitting up in chair.  Has NG tube in place.  Husband at bedside. Pending MitraClip on 2/9.  Assessment/Plan: Acute on chronic systolic/diastolic heart failure -Presented with progressively worsening shortness of breath, orthopnea, peripheral edema, elevated BNP, and chest x-ray showing bilateral pulm edema and effusion. -Last  known EF 25 to 30% from December 2022. -Cardiac consultation obtained.  -Currently on Lasix 80 mg IV daily along with milrinone drip  Severe mitral regurgitation -Noted plan for MitraClip on Thursday 2/9.  Persistent A-fib -Successful DC cardioversion on 2/3.  Currently on amiodarone drip. -Last dose of Eliquis on 12/06/2021.  Currently on heparin drip  AKI on CKD 3B -Baseline creatinine less than 1.3.  Creatinine on admission was elevated to 1.73.  Improving now. Recent Labs    12/17/21 1519 12/19/2021 1913 01/04/22 0316 01/05/22 1011 01/29/2022 0130 01/14/2022 0500 01/08/22 0540 01/09/22 0609 01/10/22 0520 01/11/22 0547  BUN 31* 30* 29* 25* 21 17 18 15 15 19   CREATININE 1.66* 1.73* 1.58* 1.53* 1.37* 1.38* 1.49* 1.41* 1.34* 1.19*   Hyperlipidemia -History of statin intolerance.  Continue Zetia  COPD Chronic respiratory failure with hypoxia -Continue bronchodilators -Continue 3 L oxygen by nasal cannula  Acquired hypothyroidism -Continue Synthroid  GERD -Continue PPI  Severe malnutrition -Related to chronic illness.  Nutrition consult appreciated.  Currently getting nutrition supplements and nocturnal tube feeds as well  Anxiety -Patient reported significant anxiety for the procedure planned on 2/9.  As needed Xanax started.  Mobility: PT eval obtained. Goals of care   Code Status: Full Code   Nutritional status:  Body mass index is 20.62 kg/m.  Nutrition Problem: Severe Malnutrition Etiology: chronic illness (CHF, COPD) Signs/Symptoms: severe fat depletion, severe muscle depletion, percent weight loss (8.4% weight loss in less than 4 months) Percent weight loss: 8.4 % Diet:  Diet Order             Diet regular Room service appropriate? Yes; Fluid consistency: Thin  Diet effective now  Wounds:  - Incision (Closed) 11/04/21 Vagina Other (Comment) (Active)  Date First Assessed/Time First Assessed: 11/04/21 1122   Location: Vagina   Location Orientation: Other (Comment)    Assessments 11/04/2021 12:32 PM 11/04/2021  1:22 PM  Dressing Type None None     No Linked orders to display     Incision (Closed) 01/24/2022 Wrist Anterior;Right (Active)  Date First Assessed/Time First Assessed: 01/27/2022 1500   Location: Wrist  Location Orientation: Anterior;Right  Present on Admission: No    Assessments 01/30/2022  7:40 PM 01/10/2022  7:30 PM  Dressing Type Transparent dressing;Gauze (Comment) None  Dressing Clean;Dry;Intact Clean;Dry;Intact  Dressing Change Frequency Other (Comment) --  Site / Wound Assessment Clean;Dry Clean;Dry  Drainage Amount None None     No Linked orders to display     Pressure Injury 01/09/22 Ischial tuberosity Lower;Left;Right;Medial Stage 1 -  Intact skin with non-blanchable redness of a localized area usually over a bony prominence. stage 1 pressure ulcer (Active)  Date First Assessed/Time First Assessed: 01/09/22 1400   Location: Ischial tuberosity  Location Orientation: Lower;Left;Right;Medial  Staging: Stage 1 -  Intact skin with non-blanchable redness of a localized area usually over a bony prominence.  Woun...    Assessments 01/09/2022  6:10 PM 01/10/2022  7:30 PM  Dressing Type -- Foam - Lift dressing to assess site every shift  Dressing Changed Clean;Dry;Intact  Dressing Change Frequency PRN --  Site / Wound Assessment Pink;Red;Dry;Clean Clean;Dry;Pink  Peri-wound Assessment Intact Intact  Drainage Amount None None     No Linked orders to display    DVT prophylaxis:  SCDs Start: 12/15/2021 2345   Antimicrobials: None Fluid: None Consultants: Cardiology Family Communication: Husband at bedside  Status is: Inpatient  Continue in-hospital care because: Pending MitraClip on 2/9 Level of care: Progressive   Dispo: The patient is from: Home              Anticipated d/c is to: Hopefully home after procedure on Thursday              Patient currently is not medically stable to d/c.   Difficult  to place patient No     Infusions:   sodium chloride     amiodarone 60 mg/hr (01/11/22 1224)   heparin 1,200 Units/hr (01/11/22 1815)   milrinone 0.125 mcg/kg/min (01/11/22 0919)    Scheduled Meds:  buPROPion  300 mg Oral Daily   Chlorhexidine Gluconate Cloth  6 each Topical Daily   colestipol  1 g Oral BID   ezetimibe  10 mg Oral Daily   feeding supplement  1 Container Oral TID BM   feeding supplement (VITAL 1.5 CAL)  1,000 mL Per Tube Q24H   ferrous sulfate  325 mg Oral Q breakfast   fluticasone  1 spray Each Nare BID   furosemide  80 mg Intravenous Daily   hydroxychloroquine  400 mg Oral QHS   levothyroxine  25 mcg Oral QAC breakfast   loratadine  10 mg Oral Daily   montelukast  10 mg Oral Daily   multivitamin with minerals  1 tablet Oral Daily   pantoprazole  40 mg Oral QHS   potassium chloride  40 mEq Oral Once   sodium chloride flush  10-40 mL Intracatheter Q12H   sodium chloride flush  3 mL Intravenous Q12H    PRN meds: sodium chloride, acetaminophen, ALPRAZolam, guaiFENesin-dextromethorphan, ipratropium, levalbuterol, loperamide, melatonin, ondansetron (ZOFRAN) IV, phenol, sodium chloride flush, sodium chloride flush, traZODone   Antimicrobials: Anti-infectives (From admission, onward)  Start     Dose/Rate Route Frequency Ordered Stop   01/04/22 2200  hydroxychloroquine (PLAQUENIL) tablet 400 mg        400 mg Oral Daily at bedtime 01/04/22 0224         Objective: Vitals:   01/11/22 1116 01/11/22 1200  BP: (!) 88/73   Pulse: 100 (!) 105  Resp: (!) 22 (!) 24  Temp: 97.7 F (36.5 C)   SpO2: 90% 91%    Intake/Output Summary (Last 24 hours) at 01/11/2022 1822 Last data filed at 01/11/2022 0606 Gross per 24 hour  Intake 1063.64 ml  Output 600 ml  Net 463.64 ml   Filed Weights   01/09/22 0352 01/10/22 0430 01/11/22 0322  Weight: 53 kg 53.6 kg 54.5 kg   Weight change: 0.9 kg Body mass index is 20.62 kg/m.   Physical Exam: General exam: Pleasant,  elderly.  Weak, not in pain Skin: No rashes, lesions or ulcers. HEENT: Atraumatic, normocephalic, no obvious bleeding Lungs: Clear to auscultation bilaterally CVS: Regular rate and rhythm, no murmur GI/Abd soft, nontender, nondistended, bowel sound present CNS: Alert, awake, oriented x3 Psychiatry: Sad affect Extremities: No pedal edema, no calf tenderness  Data Review: I have personally reviewed the laboratory data and studies available.  F/u labs ordered Unresulted Labs (From admission, onward)     Start     Ordered   01/12/22 9811  Basic metabolic panel  Daily,   R     Comments: While on milrinone.   Question:  Specimen collection method  Answer:  Lab=Lab collect   01/11/22 0648   01/12/22 0500  Cooxemetry Panel (carboxy, met, total hgb, O2 sat)  Daily,   R     Question:  Specimen collection method  Answer:  Lab=Lab collect   01/11/22 0648   01/11/22 9147  Basic metabolic panel  Daily,   R     Question:  Specimen collection method  Answer:  Lab=Lab collect   01/10/22 1059   01/10/22 1700  Magnesium  (ICU Tube Feeding: PEPuP )  5A & 5P,   R     Question:  Specimen collection method  Answer:  Lab=Lab collect   01/10/22 1041   01/10/22 1700  Phosphorus  (ICU Tube Feeding: PEPuP )  5A & 5P,   R     Question:  Specimen collection method  Answer:  Lab=Lab collect   01/10/22 1041   01/10/22 0500  Magnesium  Daily,   R     Question:  Specimen collection method  Answer:  Unit=Unit collect   01/09/22 1520   01/09/22 0500  Heparin level (unfractionated)  Daily,   R     Question:  Specimen collection method  Answer:  Unit=Unit collect   01/28/2022 2324   01/08/22 0500  .Cooxemetry Panel (carboxy, met, total hgb, O2 sat)  Daily,   R     Question:  Specimen collection method  Answer:  Unit=Unit collect   01/27/2022 1125   01/05/2022 0500  CBC with Differential/Platelet  Daily,   R      01/05/22 1226   Signed and Held  Type and screen [place]  Once,   R       Comments:  [place]    Signed and Held   Signed and Held  CBC  ONCE - STAT,   STAT       Question:  Specimen collection method  Answer:  Lab=Lab collect   Signed and Held  Signed and Held  Basic metabolic panel  ONCE - STAT,   STAT       Question:  Specimen collection method  Answer:  Lab=Lab collect   Signed and Held   Signed and Held  Protime-INR  Once,   R       Question:  Specimen collection method  Answer:  Lab=Lab collect   Signed and Held            Signed, Terrilee Croak, MD [facility] 01/11/2022

## 2022-01-11 NOTE — Progress Notes (Signed)
Received call from CCMD that patient went into A-fib again. HR 103, BP 86/65, patient currently on Heparin, Amino, and Milrinone drips. Planned Mitral Valve Clip for Thursday 01/29/2022. Called and spoke with night coverage Dr. Conley Canal. No new orders given, will continue to monitor and call for any changes.

## 2022-01-11 NOTE — Progress Notes (Signed)
°   01/11/22 1947  Assess: MEWS Score  Temp 98.2 F (36.8 C)  BP 90/63  Pulse Rate (!) 103  ECG Heart Rate (!) 104  Resp 20  Level of Consciousness Alert  SpO2 93 %  O2 Device Nasal Cannula  Patient Activity (if Appropriate) In bed  O2 Flow Rate (L/min) 4 L/min  Assess: MEWS Score  MEWS Temp 0  MEWS Systolic 1  MEWS Pulse 1  MEWS RR 0  MEWS LOC 0  MEWS Score 2  MEWS Score Color Yellow  Assess: if the MEWS score is Yellow or Red  Were vital signs taken at a resting state? Yes  Focused Assessment No change from prior assessment  Early Detection of Sepsis Score *See Row Information* Low  Treat  Pain Scale 0-10  Pain Score 0  Notify: Charge Nurse/RN  Name of Charge Nurse/RN Notified [place], RN  Date Charge Nurse/RN Notified 01/11/22  Time Charge Nurse/RN Notified 1955

## 2022-01-11 NOTE — Telephone Encounter (Signed)
Discussed patient in Valve Team meeting today.  She is scheduled for MitraClip on 01/12/2022.

## 2022-01-11 NOTE — Progress Notes (Addendum)
Advanced Heart Failure Rounding Note  PCP-Cardiologist: Will Meredith Leeds, MD   Subjective:    2/1 A fib RVR--> Started on amio drip + heparin drip. PICC placed. Diuretics held.  2/2 RHC/LHC - Had cath with preserved cardiac output, min nonob CAD, elevated PCWP suggestive of severe MR.  2/4 TEE/DC-CV  LVEF 15% RV severely HK. DC-CV to NSR  Remains in NSR on IV amio.   More SOB, coughing. + orthopnea. CVP 6. Co-ox 50%  Cor-trak placed for night feeds    Objective:   Weight Range: 54.5 kg Body mass index is 20.62 kg/m.   Vital Signs:   Temp:  [97.6 F (36.4 C)-98.1 F (36.7 C)] 97.6 F (36.4 C) (02/07 0322) Pulse Rate:  [83-96] 85 (02/07 0322) Resp:  [19-21] 20 (02/07 0322) BP: (89-104)/(54-66) 89/58 (02/07 0322) SpO2:  [90 %-98 %] 94 % (02/07 0322) Weight:  [54.5 kg] 54.5 kg (02/07 0322) Last BM Date: 01/10/22  Weight change: Filed Weights   01/09/22 0352 01/10/22 0430 01/11/22 0322  Weight: 53 kg 53.6 kg 54.5 kg    Intake/Output:   Intake/Output Summary (Last 24 hours) at 01/11/2022 0642 Last data filed at 01/11/2022 0606 Gross per 24 hour  Intake 2214.23 ml  Output 600 ml  Net 1614.23 ml       Physical Exam   General:  Frail, chronically ill appearing. + coughing  HEENT: normal Neck: supple. JVP 6 Carotids 2+ bilat; no bruits. No lymphadenopathy or thryomegaly appreciated. Cor: PMI nondisplaced. Regular rate & rhythm. No rubs, gallops or murmurs. Lungs: coarse RLL crackles Abdomen: soft, nontender, nondistended. No hepatosplenomegaly. No bruits or masses. Good bowel sounds. Extremities: no cyanosis, clubbing, rash, edema Neuro: alert & orientedx3, cranial nerves grossly intact. moves all 4 extremities w/o difficulty. Affect pleasant    Telemetry   SR 90s + PVCs (personally reviewed)   Labs    CBC Recent Labs    01/10/22 0520 01/11/22 0547  WBC 6.4 8.6  NEUTROABS 4.9 6.5  HGB 10.4* 10.1*  HCT 33.9* 32.7*  MCV 86.0 84.1  PLT 169  631    Basic Metabolic Panel Recent Labs    01/09/22 0609 01/10/22 0520  NA 137 133*  K 3.4* 4.3  CL 97* 96*  CO2 30 26  GLUCOSE 109* 225*  BUN 15 15  CREATININE 1.41* 1.34*  CALCIUM 9.0 8.6*  MG  --  1.5*    Liver Function Tests No results for input(s): AST, ALT, ALKPHOS, BILITOT, PROT, ALBUMIN in the last 72 hours.  No results for input(s): LIPASE, AMYLASE in the last 72 hours. Cardiac Enzymes No results for input(s): CKTOTAL, CKMB, CKMBINDEX, TROPONINI in the last 72 hours.  BNP: BNP (last 3 results) Recent Labs    12/21/2021 1737  BNP 2,812.3*     ProBNP (last 3 results) No results for input(s): PROBNP in the last 8760 hours.   D-Dimer No results for input(s): DDIMER in the last 72 hours. Hemoglobin A1C No results for input(s): HGBA1C in the last 72 hours. Fasting Lipid Panel No results for input(s): CHOL, HDL, LDLCALC, TRIG, CHOLHDL, LDLDIRECT in the last 72 hours. Thyroid Function Tests No results for input(s): TSH, T4TOTAL, T3FREE, THYROIDAB in the last 72 hours.  Invalid input(s): FREET3   Other results:   Imaging    DG Abd Portable 1V  Result Date: 01/10/2022 CLINICAL DATA:  Feeding tube placement. EXAM: PORTABLE ABDOMEN - 1 VIEW COMPARISON:  September 02, 2016. FINDINGS: Distal tip of feeding tube  is seen in expected position of distal stomach. No abnormal bowel dilatation. IMPRESSION: Distal tip of feeding tube seen in expected position of distal stomach. Electronically Signed   By: Marijo Conception M.D.   On: 01/10/2022 11:45     Medications:     Scheduled Medications:  buPROPion  300 mg Oral Daily   Chlorhexidine Gluconate Cloth  6 each Topical Daily   colestipol  1 g Oral BID   ezetimibe  10 mg Oral Daily   feeding supplement  1 Container Oral TID BM   ferrous sulfate  325 mg Oral Q breakfast   fluticasone  1 spray Each Nare BID   furosemide  80 mg Intravenous Daily   hydroxychloroquine  400 mg Oral QHS   levothyroxine  25 mcg Oral  QAC breakfast   loratadine  10 mg Oral Daily   montelukast  10 mg Oral Daily   multivitamin with minerals  1 tablet Oral Daily   pantoprazole  40 mg Oral QHS   sodium chloride flush  10-40 mL Intracatheter Q12H   sodium chloride flush  3 mL Intravenous Q12H    Infusions:  sodium chloride     amiodarone 60 mg/hr (01/11/22 0606)   feeding supplement (VITAL 1.5 CAL) Stopped (01/11/22 0601)   heparin 1,200 Units/hr (01/10/22 1918)    PRN Medications: sodium chloride, acetaminophen, guaiFENesin-dextromethorphan, ipratropium, levalbuterol, loperamide, melatonin, ondansetron (ZOFRAN) IV, phenol, sodium chloride flush, sodium chloride flush, traZODone    Patient Profile   [name] is a 81 year old with a history of PAF, moderate AS, severe MR, COPD, quit smoking 25 years ago, PAD, HTN, hyperlipidemia, CVA, NICM, tobacco abuse, ischemic bowel with resection in 2017,11/04/22 left ureteral calculus S/P utreteroscopy and stent,  and chronic HFrEF.     Admitted A/C HFrEF and A fib RVR.   Assessment/Plan   A/C HFrEF - Echo this admit EF down to 25% from previous Echo EF 40-45% in 2018 .  BNP on admit > 2800.  - Has LBBB, may eventually benefit from CRT depending on progress - Cath 2/2 with minimal CAD preserved cardiac output, elevated PCWP (intermittent v-waves up to 50) suggestive of severe MR.  - TEE 2/3 LVEF 15% RV severe HK. Severe MR - CVP climbing back up. CVP 6 today with low co-ox. Need to keep lungs dry. Restart  lasix 80 IV daily for now.  - With low co-ox will start milrinone    2. Severe MR  - 12/28/21 TEE with severe MR - Based on cath and symptoms her MR is playing a significant role in her poor hemodynamics but the question is whether fixing the valve will help her overall trajectory. Given low output and recurrent orthopnea, I think fixing the valve will be important to her trajectory - Plan MitraClip Thursday    3. Persistent AF - Last dose of eliquis 12/31/2021 (morning)  -> heparin - s/p DC-CV 2/3 - SR this am on amio at 60/hr, will continue - Watch for recurrent AF with addition of milrinone   4. AKI  - Recently had diuretics increased on 1/5. Creatinine on that time was 1.2.  - On admit creatinine was 1.7, yesterday 1.3. Await labs today   5. A/C Respiratory Failure - On exam has severe underlying COPD - On triple inhalers and recently placed on oxygen 12/17/21.  - Continue nebs. Pulmonary toilet - resume diuresis    6. Anemia  - Hgb 10.4. Iron Sats 7%.  - Received feraheme 02/03  7. Hypokalemia - supp as needed with diuresis - K 4.3 today  8. Severe protein calorie malnutrition - h/o embolic event to colon in 2017 requiring emergency R [facility]lectomy at [facility] followed by colostomy and then takedown - Prealbumin 11.1 - Cor-trak placed 2/6 for nighttime feeds  9. Severe deconditioning - PT/OT following  Overall very tenuous with severe HF, mitral regurgitation, severe malnutrition  and severe underlying lung disease.   Length of Stay: [place], MD  01/11/2022, 6:42 AM  Advanced Heart Failure Team Pager [phone] (M-F; 7a - 5p)  Please contact [facility] for night-coverage after hours (5p -7a ) and weekends on [email or web address]

## 2022-01-11 NOTE — Progress Notes (Signed)
Mobility Specialist Criteria Algorithm Info.    01/11/22 1206  Mobility  Activity Ambulated with assistance in hallway;Dangled on edge of bed  Range of Motion/Exercises Active;All extremities  Level of Assistance Contact guard assist, steadying assist  Assistive Device Front wheel walker  Distance Ambulated (ft) 60 ft  Activity Response Tolerated fair (anxious;dizziness;cues for pursed lip breathing);RN notified   Patient received in bed willing to participate. Independent to EOB and ambulated in hallway min guard with slow gait. Required x2 standing and x1 seated rest break secondary to dizziness. Was symptomatic when oxygen saturation in low 80's, recovers quickly w/ rest and pursed lip breathing. Was able to ambulate back to room without incident. Was left dangling EOB with all needs met, call bell in reach. RN present.    01/11/2022 12:59 PM  [place] Bronwyn Belasco, [place], [place]  HEBBW:[phone] Office: [phone]

## 2022-01-11 NOTE — Plan of Care (Signed)
°  Problem: Education: Goal: Knowledge of General Education information will improve Description: Including pain rating scale, medication(s)/side effects and non-pharmacologic comfort measures Outcome: Progressing   Problem: Health Behavior/Discharge Planning: Goal: Ability to manage health-related needs will improve Outcome: Progressing   Problem: Activity: Goal: Risk for activity intolerance will decrease Outcome: Progressing   Problem: Nutrition: Goal: Adequate nutrition will be maintained Outcome: Progressing   Problem: Coping: Goal: Level of anxiety will decrease Outcome: Progressing   Problem: Pain Managment: Goal: General experience of comfort will improve Outcome: Progressing   Problem: Skin Integrity: Goal: Risk for impaired skin integrity will decrease Outcome: Progressing   Problem: Elimination: Goal: Will not experience complications related to urinary retention Outcome: Not Applicable

## 2022-01-11 NOTE — Progress Notes (Signed)
°  [facility]   MULTIDISCIPLINARY HEART VALVE TEAM   Pre op mTEER orders placed. NPO after midnight.   Angelena Form PA-C  [facility]

## 2022-01-11 NOTE — Progress Notes (Signed)
ANTICOAGULATION CONSULT NOTE - Follow Up Consult  Pharmacy Consult for heparin Indication: atrial fibrillation  Labs: Recent Labs    01/09/22 0609 01/09/22 1421 01/10/22 0520 01/10/22 0544 01/10/22 1123 01/11/22 0547  HGB 10.5*  --  10.4*  --   --  10.1*  HCT 33.3*  --  33.9*  --   --  32.7*  PLT 151  --  169  --   --  181  HEPARINUNFRC 0.23*   < >  --  0.29* 0.32 0.27*  CREATININE 1.41*  --  1.34*  --   --  1.19*   < > = values in this interval not displayed.    Assessment: 81yo female who was on apix PTA 2.5mg  BID for a fib that was left off med rec and not continued on admit to ED on 1/30. Patient underwent TEE/DCCV 2/3 with plans for MitraClip.   Heparin level this morning came back slightly subtherapeutic at 0.27 on 1200 units/hr.  CBC stable. No infusion issues or s/sx of bleeding per RN.   Goal of Therapy:  Heparin level 0.3-0.7 units/ml   Plan:  Increase IV heparin to 1300 units/hr. Daily heparin level and CBC while on heparin F/u plans for MitraClip  [place] Crane, Roylene Reason, [facility] Clinical Pharmacist  01/11/2022 3:06 PM   [facility] pharmacy phone numbers are listed on [email or web address]

## 2022-01-12 ENCOUNTER — Telehealth: Payer: Self-pay

## 2022-01-12 DIAGNOSIS — I5043 Acute on chronic combined systolic (congestive) and diastolic (congestive) heart failure: Secondary | ICD-10-CM | POA: Diagnosis not present

## 2022-01-12 LAB — CBC WITH DIFFERENTIAL/PLATELET
Abs Immature Granulocytes: 0.03 10*3/uL (ref 0.00–0.07)
Basophils Absolute: 0 10*3/uL (ref 0.0–0.1)
Basophils Relative: 0 %
Eosinophils Absolute: 0.1 10*3/uL (ref 0.0–0.5)
Eosinophils Relative: 1 %
HCT: 29.6 % — ABNORMAL LOW (ref 36.0–46.0)
Hemoglobin: 9.4 g/dL — ABNORMAL LOW (ref 12.0–15.0)
Immature Granulocytes: 0 %
Lymphocytes Relative: 10 %
Lymphs Abs: 0.7 10*3/uL (ref 0.7–4.0)
MCH: 27 pg (ref 26.0–34.0)
MCHC: 31.8 g/dL (ref 30.0–36.0)
MCV: 85.1 fL (ref 80.0–100.0)
Monocytes Absolute: 1.3 10*3/uL — ABNORMAL HIGH (ref 0.1–1.0)
Monocytes Relative: 18 %
Neutro Abs: 5.1 10*3/uL (ref 1.7–7.7)
Neutrophils Relative %: 71 %
Platelets: 160 10*3/uL (ref 150–400)
RBC: 3.48 MIL/uL — ABNORMAL LOW (ref 3.87–5.11)
RDW: 18 % — ABNORMAL HIGH (ref 11.5–15.5)
WBC: 7.3 10*3/uL (ref 4.0–10.5)
nRBC: 0 % (ref 0.0–0.2)

## 2022-01-12 LAB — BASIC METABOLIC PANEL
Anion gap: 11 (ref 5–15)
BUN: 16 mg/dL (ref 8–23)
CO2: 29 mmol/L (ref 22–32)
Calcium: 8.6 mg/dL — ABNORMAL LOW (ref 8.9–10.3)
Chloride: 92 mmol/L — ABNORMAL LOW (ref 98–111)
Creatinine, Ser: 1.33 mg/dL — ABNORMAL HIGH (ref 0.44–1.00)
GFR, Estimated: 40 mL/min — ABNORMAL LOW (ref >60)
Glucose, Bld: 128 mg/dL — ABNORMAL HIGH (ref 70–99)
Potassium: 3.7 mmol/L (ref 3.5–5.1)
Sodium: 132 mmol/L — ABNORMAL LOW (ref 135–145)

## 2022-01-12 LAB — GLUCOSE, CAPILLARY
Glucose-Capillary: 140 mg/dL — ABNORMAL HIGH (ref 70–99)
Glucose-Capillary: 151 mg/dL — ABNORMAL HIGH (ref 70–99)
Glucose-Capillary: 115 mg/dL — ABNORMAL HIGH (ref 70–99)
Glucose-Capillary: 107 mg/dL — ABNORMAL HIGH (ref 70–99)
Glucose-Capillary: 108 mg/dL — ABNORMAL HIGH (ref 70–99)
Glucose-Capillary: 147 mg/dL — ABNORMAL HIGH (ref 70–99)

## 2022-01-12 LAB — COOXEMETRY PANEL
Carboxyhemoglobin: 1.4 % (ref 0.5–1.5)
Methemoglobin: 1.1 % (ref 0.0–1.5)
O2 Saturation: 61.2 %
Total hemoglobin: 10.3 g/dL — ABNORMAL LOW (ref 12.0–16.0)

## 2022-01-12 LAB — PHOSPHORUS
Phosphorus: 3.5 mg/dL (ref 2.5–4.6)
Phosphorus: 3.6 mg/dL (ref 2.5–4.6)

## 2022-01-12 LAB — MAGNESIUM
Magnesium: 2.1 mg/dL (ref 1.7–2.4)
Magnesium: 2 mg/dL (ref 1.7–2.4)

## 2022-01-12 LAB — HEPARIN LEVEL (UNFRACTIONATED): Heparin Unfractionated: 0.32 IU/mL (ref 0.30–0.70)

## 2022-01-12 MED ORDER — BISACODYL 5 MG PO TBEC: 5.0000 mg | DELAYED_RELEASE_TABLET | Freq: Once | ORAL | Status: DC

## 2022-01-12 MED ORDER — DEXMEDETOMIDINE HCL IN NACL 400 MCG/100ML IV SOLN
0.1000 ug/kg/h | INTRAVENOUS | Status: AC
  Administered 2022-01-13: 6 ug/kg/h via INTRAVENOUS
  Filled 2022-01-12 (×2): qty 100

## 2022-01-12 MED ORDER — MAGNESIUM SULFATE 50 % IJ SOLN
40.0000 meq | INTRAMUSCULAR | Status: DC
  Filled 2022-01-12 (×2): qty 9.85

## 2022-01-12 MED ORDER — NOREPINEPHRINE 4 MG/250ML-% IV SOLN
0.0000 ug/min | INTRAVENOUS | Status: AC
  Administered 2022-01-13: 2 ug/min via INTRAVENOUS
  Filled 2022-01-12 (×2): qty 250

## 2022-01-12 MED ORDER — HEPARIN 30,000 UNITS/1000 ML (OHS) CELLSAVER SOLUTION
Status: DC
  Filled 2022-01-12 (×2): qty 1000

## 2022-01-12 MED ORDER — FUROSEMIDE 10 MG/ML IJ SOLN
80.0000 mg | Freq: Once | INTRAMUSCULAR | Status: AC
  Administered 2022-01-12: 80 mg via INTRAVENOUS
  Filled 2022-01-12: qty 8

## 2022-01-12 MED ORDER — CHLORHEXIDINE GLUCONATE 0.12 % MT SOLN
15.0000 mL | Freq: Once | OROMUCOSAL | Status: DC
  Filled 2022-01-12: qty 15

## 2022-01-12 MED ORDER — POTASSIUM CHLORIDE CRYS ER 20 MEQ PO TBCR
40.0000 meq | EXTENDED_RELEASE_TABLET | Freq: Once | ORAL | Status: AC
  Administered 2022-01-12: 40 meq via ORAL
  Filled 2022-01-12: qty 2

## 2022-01-12 MED ORDER — CEFAZOLIN SODIUM-DEXTROSE 2-4 GM/100ML-% IV SOLN
2.0000 g | INTRAVENOUS | Status: AC
  Administered 2022-01-13: 2 g via INTRAVENOUS
  Filled 2022-01-12 (×2): qty 100

## 2022-01-12 MED ORDER — CHLORHEXIDINE GLUCONATE 4 % EX LIQD
1.0000 "application " | Freq: Once | CUTANEOUS | Status: AC
  Administered 2022-01-13: 1 via TOPICAL
  Filled 2022-01-12 (×3): qty 15

## 2022-01-12 MED ORDER — METOLAZONE 2.5 MG PO TABS
2.5000 mg | ORAL_TABLET | Freq: Once | ORAL | Status: AC
  Administered 2022-01-12: 2.5 mg via ORAL
  Filled 2022-01-12: qty 1

## 2022-01-12 MED ORDER — POTASSIUM CHLORIDE 2 MEQ/ML IV SOLN
80.0000 meq | INTRAVENOUS | Status: DC
  Filled 2022-01-12 (×2): qty 40

## 2022-01-12 NOTE — Progress Notes (Signed)
Mobility Specialist Criteria Algorithm Info.    01/12/22 1445  Mobility  Activity Ambulated with assistance in hallway;Transferred to/from [facility]  Range of Motion/Exercises Active;All extremities  Level of Assistance Contact guard assist, steadying assist  Assistive Device Front wheel walker  Distance Ambulated (ft) 50 ft  Activity Response Tolerated well   Patient received dangling EOB, agreeable to participate in mobility. Required min A to stand + cues for hand placement on RW. Ambulated in hallway min G with slow gait. Needed x1 seated rest break secondary to fatigue, denied any dizziness. SPO2 was 89-92% on 4LO2. Upon returning to room pt requested to use [facility] and then returned to bed. Was left with all needs met, call bell in reach.  01/12/2022 4:14 PM  [name], [place], [place]  NMMGY:[phone] Office: [phone]

## 2022-01-12 NOTE — Progress Notes (Signed)
PROGRESS NOTE  [name]  DOB: 1941-06-25  PCP: Biagio Borg, MD [id number]  DOA: 12/17/2021  LOS: 9 days  Hospital Day: 33  Chief Complaint  Patient presents with   Atrial Fibrillation   Brief narrative: [name] is a 81 y.o. female with PMH significant for chronic systolic/diastolic heart failure, nonischemic cardiomyopathy with most recent LVEF 25 to 30%, paroxysmal atrial fibrillation complicated by sick sinus syndrome, COPD, hyperlipidemia, anemia of chronic disease. Patient presented to the ED on 12/25/2021 with progressively worsening shortness of breath for 4 to 5 days, stated with orthopnea, chronic bilateral lower extremity edema despite good compliance to her medications including Lasix.  In the ED, blood pressure was running low. Creatinine elevated to 1.73, BNP elevated to 2800 CBC with WBC count 5.2, hemoglobin 10.8 Urinalysis nonsuggestive of infection EKG with A-fib at the rate of 108, no evidence of ST-T wave changes Chest x-ray bilateral pulmonary edema, bilateral pleural effusion Admit to hospitalist service Cardiology consultation was obtained.  Diuretics was started  2/1, patient went to A-fib with RVR.  Patient was started on amiodarone and heparin drip. 2/2, patient underwent RHC/LHC.  Minimal nonobstructive CAD.  It also showed elevated PCWP suggestive of severe MR 2/4, patient underwent TEE/DC cardioversion.  It showed LVEF of 15%, RV severely hypokinetic.  DC cardioversion was successful to normal sinus rhythm  Subjective: Patient was seen and examined this morning.   Propped up in bed.  Not in distress.  Looks weak.  Has NG tube for nocturnal feeding. Tachycardic this morning. Pending MitraClip on 2/9.  Assessment/Plan: Acute on chronic systolic/diastolic heart failure -Presented with progressively worsening shortness of breath, orthopnea, peripheral edema, elevated BNP, and chest x-ray showing bilateral pulm edema and effusion. -Last  known EF 25 to 30% from December 2022. -Cardiac consultation obtained.  -Currently on Lasix 80 mg IV daily along with milrinone drip  Severe mitral regurgitation -Noted plan for MitraClip on Thursday 2/9.  Persistent A-fib -Underwent DC cardioversion on 2/3.  Currently on amiodarone drip.  Patient was back in A-fib with RVR this morning.  Cardiology following. -Last dose of Eliquis on 12/24/2021.  Currently on heparin drip  AKI on CKD 3B -Baseline creatinine less than 1.3.  Creatinine this admission was elevated to 1.73.  Improving now. Recent Labs    12/19/2021 1913 01/04/22 0316 01/05/22 1011 01/25/2022 0130 01/15/2022 0500 01/08/22 0540 01/09/22 0609 01/10/22 0520 01/11/22 0547 01/12/22 0350  BUN 30* 29* 25* 21 17 18 15 15 19 16   CREATININE 1.73* 1.58* 1.53* 1.37* 1.38* 1.49* 1.41* 1.34* 1.19* 1.33*   Hyperlipidemia -History of statin intolerance.  Continue Zetia  COPD Chronic respiratory failure with hypoxia -Continue bronchodilators -Continue 3 L oxygen by nasal cannula  Acquired hypothyroidism -Continue Synthroid  GERD -Continue PPI  S/P colectomy Chronic diarrhea Severe malnutrition -Patient had right hemicolectomy done in 2017 for ischemic colon.  Patient states that since then she has malabsorption and chronic diarrhea with weight loss.  She uses as needed Imodium at home.  She has severe malnutrition.  Dietitian consult appreciated.  Currently she is getting nutrition supplements and nocturnal tube feeds as well.  Continue as needed Imodium.  Anxiety -Patient reported significant anxiety for the procedure planned on 2/9.  As needed Xanax started.  Mobility: PT eval obtained. Goals of care   Code Status: Full Code   Nutritional status:  Body mass index is 20.85 kg/m.  Nutrition Problem: Severe Malnutrition Etiology: chronic illness (CHF, COPD) Signs/Symptoms: severe fat  depletion, severe muscle depletion, percent weight loss (8.4% weight loss in less than  4 months) Percent weight loss: 8.4 % Diet:  Diet Order             Diet regular Room service appropriate? Yes; Fluid consistency: Thin  Diet effective now                   Wounds:  - Incision (Closed) 11/04/21 Vagina Other (Comment) (Active)  Date First Assessed/Time First Assessed: 11/04/21 1122   Location: Vagina  Location Orientation: Other (Comment)    Assessments 11/04/2021 12:32 PM 11/04/2021  1:22 PM  Dressing Type None None     No Linked orders to display     Incision (Closed) 01/16/2022 Wrist Anterior;Right (Active)  Date First Assessed/Time First Assessed: 01/15/2022 1500   Location: Wrist  Location Orientation: Anterior;Right  Present on Admission: No    Assessments 01/30/2022  7:40 PM 01/10/2022  7:30 PM  Dressing Type Transparent dressing;Gauze (Comment) None  Dressing Clean;Dry;Intact Clean;Dry;Intact  Dressing Change Frequency Other (Comment) --  Site / Wound Assessment Clean;Dry Clean;Dry  Drainage Amount None None     No Linked orders to display     Pressure Injury 01/09/22 Ischial tuberosity Lower;Left;Right;Medial Stage 1 -  Intact skin with non-blanchable redness of a localized area usually over a bony prominence. stage 1 pressure ulcer (Active)  Date First Assessed/Time First Assessed: 01/09/22 1400   Location: Ischial tuberosity  Location Orientation: Lower;Left;Right;Medial  Staging: Stage 1 -  Intact skin with non-blanchable redness of a localized area usually over a bony prominence.  Woun...    Assessments 01/09/2022  6:10 PM 01/11/2022  7:47 PM  Dressing Type -- Foam - Lift dressing to assess site every shift  Dressing Changed Changed;Clean;Dry;Intact  Dressing Change Frequency PRN --  Site / Wound Assessment Pink;Red;Dry;Clean Clean;Dry  Peri-wound Assessment Intact --  Drainage Amount None None     No Linked orders to display    DVT prophylaxis:  SCDs Start: 01/02/2022 2345   Antimicrobials: None Fluid: None Consultants: Cardiology Family  Communication: Husband at bedside  Status is: Inpatient  Continue in-hospital care because: Pending MitraClip on 2/9 Level of care: Progressive   Dispo: The patient is from: Home              Anticipated d/c is to: Hopefully home after procedure on Thursday              Patient currently is not medically stable to d/c.   Difficult to place patient No     Infusions:   sodium chloride     amiodarone 60 mg/hr (01/12/22 1042)   heparin 1,200 Units/hr (01/11/22 1815)   milrinone 0.125 mcg/kg/min (01/11/22 0919)    Scheduled Meds:  buPROPion  300 mg Oral Daily   Chlorhexidine Gluconate Cloth  6 each Topical Daily   colestipol  1 g Oral BID   ezetimibe  10 mg Oral Daily   feeding supplement  1 Container Oral TID BM   feeding supplement (VITAL 1.5 CAL)  1,000 mL Per Tube Q24H   ferrous sulfate  325 mg Oral Q breakfast   fluticasone  1 spray Each Nare BID   furosemide  80 mg Intravenous Daily   hydroxychloroquine  400 mg Oral QHS   levothyroxine  25 mcg Oral QAC breakfast   loratadine  10 mg Oral Daily   montelukast  10 mg Oral Daily   multivitamin with minerals  1 tablet Oral Daily  pantoprazole  40 mg Oral QHS   sodium chloride flush  10-40 mL Intracatheter Q12H   sodium chloride flush  3 mL Intravenous Q12H    PRN meds: sodium chloride, acetaminophen, ALPRAZolam, guaiFENesin-dextromethorphan, ipratropium, levalbuterol, loperamide, melatonin, ondansetron (ZOFRAN) IV, phenol, sodium chloride flush, sodium chloride flush, traZODone   Antimicrobials: Anti-infectives (From admission, onward)    Start     Dose/Rate Route Frequency Ordered Stop   01/04/22 2200  hydroxychloroquine (PLAQUENIL) tablet 400 mg        400 mg Oral Daily at bedtime 01/04/22 0224         Objective: Vitals:   01/12/22 0309 01/12/22 0743  BP: 98/72 91/68  Pulse: (!) 113 99  Resp: 20 18  Temp: 97.8 F (36.6 C) 97.8 F (36.6 C)  SpO2: 92%     Intake/Output Summary (Last 24 hours) at 01/12/2022  1312 Last data filed at 01/12/2022 0400 Gross per 24 hour  Intake 1314.88 ml  Output 2 ml  Net 1312.88 ml   Filed Weights   01/10/22 0430 01/11/22 0322 01/12/22 0437  Weight: 53.6 kg 54.5 kg 55.1 kg   Weight change: 0.6 kg Body mass index is 20.85 kg/m.   Physical Exam: General exam: Pleasant, elderly.  Weak, not in pain Skin: No rashes, lesions or ulcers. HEENT: Atraumatic, normocephalic, no obvious bleeding Lungs: Clear to auscultation bilaterally CVS: Tachycardic, irregular rhythm, no murmur  GI/Abd soft, nontender, nondistended, bowel sound present CNS: Alert, awake, oriented x3 Psychiatry: Sad affect Extremities: No pedal edema, no calf tenderness  Data Review: I have personally reviewed the laboratory data and studies available.  F/u labs ordered Unresulted Labs (From admission, onward)     Start     Ordered   01/12/22 8502  Basic metabolic panel  Daily,   R     Comments: While on milrinone.   Question:  Specimen collection method  Answer:  Lab=Lab collect   01/11/22 0648   01/12/22 0500  Cooxemetry Panel (carboxy, met, total hgb, O2 sat)  Daily,   R     Question:  Specimen collection method  Answer:  Lab=Lab collect   01/11/22 0648   01/11/22 7741  Basic metabolic panel  Daily,   R     Question:  Specimen collection method  Answer:  Lab=Lab collect   01/10/22 1059   01/10/22 1700  Magnesium  (ICU Tube Feeding: PEPuP )  5A & 5P,   R     Question:  Specimen collection method  Answer:  Lab=Lab collect   01/10/22 1041   01/10/22 1700  Phosphorus  (ICU Tube Feeding: PEPuP )  5A & 5P,   R     Question:  Specimen collection method  Answer:  Lab=Lab collect   01/10/22 1041   01/10/22 0500  Magnesium  Daily,   R     Question:  Specimen collection method  Answer:  Unit=Unit collect   01/09/22 1520   01/09/22 0500  Heparin level (unfractionated)  Daily,   R     Question:  Specimen collection method  Answer:  Unit=Unit collect   01/15/2022 2324   01/08/22 0500   .Cooxemetry Panel (carboxy, met, total hgb, O2 sat)  Daily,   R     Question:  Specimen collection method  Answer:  Unit=Unit collect   01/16/2022 1125   01/23/2022 0500  CBC with Differential/Platelet  Daily,   R      01/05/22 1226   Signed and Held  Type and screen  [place]  Once,   R       Comments: [place]    Signed and Held   Signed and Held  CBC  ONCE - STAT,   STAT       Question:  Specimen collection method  Answer:  Lab=Lab collect   Signed and Held   Signed and Held  Basic metabolic panel  ONCE - STAT,   STAT       Question:  Specimen collection method  Answer:  Lab=Lab collect   Signed and Held   Signed and Held  Protime-INR  Once,   R       Question:  Specimen collection method  Answer:  Lab=Lab collect   Signed and Held            Signed, Terrilee Croak, MD [facility] 01/12/2022

## 2022-01-12 NOTE — Progress Notes (Signed)
ANTICOAGULATION CONSULT NOTE - Follow Up Consult  Pharmacy Consult for heparin Indication: atrial fibrillation  Labs: Recent Labs    01/10/22 0520 01/10/22 0544 01/10/22 1123 01/11/22 0547 01/12/22 0350  HGB 10.4*  --   --  10.1* 9.4*  HCT 33.9*  --   --  32.7* 29.6*  PLT 169  --   --  181 160  HEPARINUNFRC  --    < > 0.32 0.27* 0.32  CREATININE 1.34*  --   --  1.19* 1.33*   < > = values in this interval not displayed.    Assessment: 81yo female who was on apix PTA 2.5mg  BID for a fib that was left off med rec and not continued on admit to ED on 1/30. Patient underwent TEE/DCCV 2/3 with plans for MitraClip.   Heparin level this morning is at goal on 1200 units/hr. Hgb down slightly to 9.4. No infusion issues or s/sx of bleeding per RN.   Goal of Therapy:  Heparin level 0.3-0.7 units/ml   Plan:  Continue  IV heparin to 1300 units/hr. Daily heparin level and CBC while on heparin F/u plans for MitraClip 2/9  Erin Hearing PharmD., BCPS Clinical Pharmacist 01/12/2022 9:44 AM

## 2022-01-12 NOTE — Progress Notes (Signed)
Progress Note  Patient Name: [name] Date of Encounter: 01/12/2022  [facility] Cardiologist: Will Meredith Leeds, MD   Subjective   Patient remains anxious and dyspneic.  Remains in atrial fibrillation with occasionally rapid rates.  Co-oximetry improved on milrinone but creatinine elevated.  Inpatient Medications    Scheduled Meds:  buPROPion  300 mg Oral Daily   Chlorhexidine Gluconate Cloth  6 each Topical Daily   colestipol  1 g Oral BID   ezetimibe  10 mg Oral Daily   feeding supplement  1 Container Oral TID BM   feeding supplement (VITAL 1.5 CAL)  1,000 mL Per Tube Q24H   ferrous sulfate  325 mg Oral Q breakfast   fluticasone  1 spray Each Nare BID   furosemide  80 mg Intravenous Daily   hydroxychloroquine  400 mg Oral QHS   levothyroxine  25 mcg Oral QAC breakfast   loratadine  10 mg Oral Daily   [START ON 01/31/2022] magnesium sulfate  40 mEq Other To OR   montelukast  10 mg Oral Daily   multivitamin with minerals  1 tablet Oral Daily   pantoprazole  40 mg Oral QHS   [START ON 01/28/2022] potassium chloride  80 mEq Other To OR   sodium chloride flush  10-40 mL Intracatheter Q12H   sodium chloride flush  3 mL Intravenous Q12H   Continuous Infusions:  sodium chloride     amiodarone 60 mg/hr (01/12/22 1542)   [START ON 01/18/2022]  ceFAZolin (ANCEF) IV     [START ON 01/25/2022] dexmedetomidine     [START ON 01/31/2022] heparin 30,000 units/NS 1000 mL solution for CELLSAVER     heparin 1,300 Units/hr (01/12/22 1600)   milrinone 0.125 mcg/kg/min (01/11/22 0919)   [START ON 01/22/2022] norepinephrine (LEVOPHED) Adult infusion     PRN Meds: sodium chloride, acetaminophen, ALPRAZolam, guaiFENesin-dextromethorphan, ipratropium, levalbuterol, loperamide, melatonin, ondansetron (ZOFRAN) IV, phenol, sodium chloride flush, sodium chloride flush, traZODone   Vital Signs    Vitals:   01/11/22 2348 01/12/22 0309 01/12/22 0437 01/12/22 0743  BP: 92/63 98/72  91/68   Pulse: (!) 113 (!) 113  99  Resp: 20 20  18   Temp: 97.9 F (36.6 C) 97.8 F (36.6 C)  97.8 F (36.6 C)  TempSrc: Oral Oral  Oral  SpO2: 91% 92%    Weight:   55.1 kg   Height:        Intake/Output Summary (Last 24 hours) at 01/12/2022 1610 Last data filed at 01/12/2022 1000 Gross per 24 hour  Intake 1436.7 ml  Output 2 ml  Net 1434.7 ml   Last 3 Weights 01/12/2022 01/11/2022 01/10/2022  Weight (lbs) 121 lb 7.6 oz 120 lb 2.4 oz 118 lb 2.7 oz  Weight (kg) 55.1 kg 54.5 kg 53.6 kg      Telemetry    Atrial fibrillation with rapid ventricular rate - Personally Reviewed  ECG    N/a  Physical Exam   GEN: No acute distress, frail, elderly  Neck: +JVD Cardiac: Irregular, tachycardic cannot fully appreciate any murmurs Respiratory: Crackles bilaterally GI: Soft, nontender, non-distended  Lauren: No edema; No deformity. Neuro:  Nonfocal  Psych: Normal affect   Labs    High Sensitivity Troponin:   Recent Labs  Lab 12/28/2021 1913 12/18/2021 2113  TROPONINIHS 13 13     Chemistry Recent Labs  Lab 01/10/22 0520 01/11/22 0547 01/12/22 0350  NA 133* 129* 132*  K 4.3 3.6 3.7  CL 96* 91* 92*  CO2  26 27 29   GLUCOSE 225* 200* 128*  BUN 15 19 16   CREATININE 1.34* 1.19* 1.33*  CALCIUM 8.6* 8.5* 8.6*  MG 1.5* 2.3 2.1  GFRNONAA 40* 46* 40*  ANIONGAP 11 11 11     Lipids No results for input(s): CHOL, TRIG, HDL, LABVLDL, LDLCALC, CHOLHDL in the last 168 hours.  Hematology Recent Labs  Lab 01/10/22 0520 01/11/22 0547 01/12/22 0350  WBC 6.4 8.6 7.3  RBC 3.94 3.89 3.48*  HGB 10.4* 10.1* 9.4*  HCT 33.9* 32.7* 29.6*  MCV 86.0 84.1 85.1  MCH 26.4 26.0 27.0  MCHC 30.7 30.9 31.8  RDW 17.2* 17.7* 18.0*  PLT 169 181 160   Thyroid No results for input(s): TSH, FREET4 in the last 168 hours.  BNPNo results for input(s): BNP, PROBNP in the last 168 hours.  DDimer No results for input(s): DDIMER in the last 168 hours.   Radiology    No results found.  Cardiac Studies   TEE,  cardiac catheterization reviewed  Patient Profile     [name] is a 81 year old with a history of PAF, moderate AS, severe MR, COPD, quit smoking 25 years ago, PAD, HTN, hyperlipidemia, CVA, NICM, tobacco abuse, ischemic bowel with resection in 2017,11/04/22 left ureteral calculus S/P utreteroscopy and stent,  and chronic HFrEF.    Assessment & Plan     Acute on chronic systolic failure: The patient continues to decline.  She is net positive over the last several days despite receiving intermittent Lasix.  I had a long talk with the family regarding (salvage)  mTEER.  I believe this is her only option and we will have to see how much benefit she would accrue from this depending on how much MR reduction is achieved balanced against iatrogenic mitral stenosis.   On for mTEER tomorrow. AF: Continue heparin and amiodarone drips AKI: Continue intermittent Lasix and may need to tolerate higher creatinine in order to unload left ventricle to be able to achieve better mTEER result.  Discussed with Dr. Haroldine Laws, will give lasix 80mg  and metolazone 2.5mg  now. Malnutrition:  On nocturnal TF, lasix to keep even or net negative.     For questions or updates, please contact [place] Please consult [email or web address] for contact info under        Signed, Early Osmond, MD  01/12/2022, 4:10 PM

## 2022-01-12 NOTE — Progress Notes (Signed)
Physical Therapy Treatment Patient Details Name: [name] MRN: [id number] DOB: 03-28-1941 Today's Date: 01/12/2022   History of Present Illness Patient is a 81 y/o female who presents on 12/08/2021 with weakness, fluttering in chest and SOB. CXR- bil pulmonary edema. Found to have acute on chronic CHF, A-fib with RVR and severe mitral regurgitation.s/p cardioversion 2/3. Plan for possible valve repair 2/9 pending nutrition. PMH includes PAF, moderate AS, COPD, quit smoking 25 years ago, PAD, HTN, CVA, NICM, tobacco abuse, ischemic bowel with resection in 2017,11/04/22 left ureteral calculus S/P utreteroscopy and stent and chronic HFrEF.    PT Comments    Pt progressing incrementally towards her physical therapy goals. Ambulating 30 ft x 2 with a Rollator at a min assist level; requires one seated rest break. Sp92 92% on 4-6L O2, HR 102-120 bpm. Pt with bowel urgency which limited further distance. Although trialed Rollator this session, pt with modest instability and would likely benefit from continued use of RW for increased balance; discussed with pt/pt husband. Will likely need HHPT.    Recommendations for follow up therapy are one component of a multi-disciplinary discharge planning process, led by the attending physician.  Recommendations may be updated based on patient status, additional functional criteria and insurance authorization.  Follow Up Recommendations  Home health PT     Assistance Recommended at Discharge Frequent or constant Supervision/Assistance  Patient can return home with the following A little help with walking and/or transfers;A little help with bathing/dressing/bathroom;Help with stairs or ramp for entrance   Equipment Recommendations  Rolling walker (2 wheels);BSC/3in1    Recommendations for Other Services       Precautions / Restrictions Precautions Precautions: Fall;Other (comment) Precaution Comments: watch 02 Restrictions Weight Bearing Restrictions:  No     Mobility  Bed Mobility Overal bed mobility: Needs Assistance Bed Mobility: Supine to Sit     Supine to sit: Supervision          Transfers Overall transfer level: Needs assistance Equipment used: Rollator (4 wheels), None Transfers: Sit to/from Stand, Bed to chair/wheelchair/BSC Sit to Stand: Min assist Stand pivot transfers: Min assist         General transfer comment: MinA to rise and steady, cues for locking Rollator prior to transition    Ambulation/Gait Ambulation/Gait assistance: Min assist Gait Distance (Feet): 60 Feet (30 ft x 2) Assistive device: Rollator (4 wheels) Gait Pattern/deviations: Step-through pattern, Decreased stride length, Trunk flexed, Trendelenburg Gait velocity: decreased Gait velocity interpretation: <1.8 ft/sec, indicate of risk for recurrent falls   General Gait Details: Pt ambulating 30 ft x 2 with a Rollator and minA for balance, required one seated rest break. Bowel urgency during walk   Stairs             Wheelchair Mobility    Modified Rankin (Stroke Patients Only)       Balance Overall balance assessment: Needs assistance Sitting-balance support: Feet supported Sitting balance-[name]: Fair     Standing balance support: Bilateral upper extremity supported Standing balance-[name]: Poor                              Cognition Arousal/Alertness: Awake/alert Behavior During Therapy: WFL for tasks assessed/performed Overall Cognitive Status: Within Functional Limits for tasks assessed  Exercises General Exercises - Lower Extremity Long Arc Quad: Both, 10 reps, Seated Toe Raises: Both, 20 reps, Seated Heel Raises: Both, 20 reps, Seated    General Comments        Pertinent Vitals/Pain Pain Assessment Pain Assessment: No/denies pain    Home Living                          Prior Function            PT Goals  (current goals can now be found in the care plan section) Acute Rehab PT Goals Patient Stated Goal: improved breathing Potential to Achieve Goals: Good    Frequency    Min 3X/week      PT Plan Discharge plan needs to be updated;Equipment recommendations need to be updated    Co-evaluation              AM-PAC PT "6 Clicks" Mobility   Outcome Measure  Help needed turning from your back to your side while in a flat bed without using bedrails?: A Little Help needed moving from lying on your back to sitting on the side of a flat bed without using bedrails?: A Little Help needed moving to and from a bed to a chair (including a wheelchair)?: A Little Help needed standing up from a chair using your arms (e.g., wheelchair or bedside chair)?: A Little Help needed to walk in hospital room?: A Little Help needed climbing 3-5 steps with a railing? : A Lot 6 Click Score: 17    End of Session Equipment Utilized During Treatment: Gait belt;Oxygen Activity Tolerance: Patient limited by fatigue Patient left: in chair;with call bell/phone within reach;with family/visitor present Nurse Communication: Mobility status PT Visit Diagnosis: Unsteadiness on feet (R26.81);Muscle weakness (generalized) (M62.81)     Time: 9794-8016 PT Time Calculation (min) (ACUTE ONLY): 35 min  Charges:  $Therapeutic Activity: 23-37 mins                     Wyona Almas, PT, DPT Acute Rehabilitation Services Pager [phone] Office [phone]    Deno Etienne 01/12/2022, 9:07 AM

## 2022-01-12 NOTE — Progress Notes (Addendum)
Advanced Heart Failure Rounding Note  PCP-Cardiologist: Lauren Meredith Leeds, MD   Subjective:    2/1 A fib RVR--> Started on amio drip + heparin drip. PICC placed. Diuretics held.  2/2 RHC/LHC - Had cath with preserved cardiac output, min nonob CAD, elevated PCWP suggestive of severe MR.  2/4 TEE/DC-CV  LVEF 15% RV severely HK. DC-CV to NSR 2/7 Milrinone added for low co-ox (50%). IV diuretics restarted   Reverted back to Afib overnight. V-rates 110s. Remains on amio gtt at 60/hr  K 3.7 Mg 2.1   Remains on Milrinone 0.125. Co-ox up to 61% today  1.6L in measured UOP yesterday + 3 unmeasured voids but overall net + 1.6L for the day. Wt up 1 lb.  CVP 7-8   SCr 1.2>>1.3   Cor-trak placed for night feeds   Scheduled for MitraClip tomorrow.   Still feels SOB.    Objective:   Weight Range: 55.1 kg Body mass index is 20.85 kg/m.   Vital Signs:   Temp:  [97.7 F (36.5 C)-98.2 F (36.8 C)] 97.8 F (36.6 C) (02/08 0743) Pulse Rate:  [87-115] 99 (02/08 0743) Resp:  [15-25] 18 (02/08 0743) BP: (88-98)/(63-73) 91/68 (02/08 0743) SpO2:  [90 %-95 %] 92 % (02/08 0309) FiO2 (%):  [36 %] 36 % (02/07 0837) Weight:  [55.1 kg] 55.1 kg (02/08 0437) Last BM Date: 01/11/22  Weight change: Filed Weights   01/10/22 0430 01/11/22 0322 01/12/22 0437  Weight: 53.6 kg 54.5 kg 55.1 kg    Intake/Output:   Intake/Output Summary (Last 24 hours) at 01/12/2022 0817 Last data filed at 01/12/2022 0400 Gross per 24 hour  Intake 1547.94 ml  Output 2 ml  Net 1545.94 ml      Physical Exam   CVP 7-8  General:  thin/ frail elderly WF. No respiratory difficulty HEENT: normal + cortrak  Neck: supple. JVD 8 cm. Carotids 2+ bilat; no bruits. No lymphadenopathy or thyromegaly appreciated. Cor: PMI nondisplaced. Irregularly irregular rhythm and rate + MR murmur  Lungs: decreased BS w/ RLL crackles  Abdomen: soft, nontender, nondistended. No hepatosplenomegaly. No bruits or masses. Good bowel  sounds. Extremities: no cyanosis, clubbing, rash, edema + RUE PICC  Neuro: alert & oriented x 3, cranial nerves grossly intact. moves all 4 extremities w/o difficulty. Affect pleasant.  Telemetry   Afib 110s w/ PVCs (personally reviewed)   Labs    CBC Recent Labs    01/11/22 0547 01/12/22 0350  WBC 8.6 7.3  NEUTROABS 6.5 5.1  HGB 10.1* 9.4*  HCT 32.7* 29.6*  MCV 84.1 85.1  PLT 181 124   Basic Metabolic Panel Recent Labs    01/11/22 0547 01/12/22 0350  NA 129* 132*  K 3.6 3.7  CL 91* 92*  CO2 27 29  GLUCOSE 200* 128*  BUN 19 16  CREATININE 1.19* 1.33*  CALCIUM 8.5* 8.6*  MG 2.3 2.1  PHOS 3.2 3.5   Liver Function Tests No results for input(s): AST, ALT, ALKPHOS, BILITOT, PROT, ALBUMIN in the last 72 hours.  No results for input(s): LIPASE, AMYLASE in the last 72 hours. Cardiac Enzymes No results for input(s): CKTOTAL, CKMB, CKMBINDEX, TROPONINI in the last 72 hours.  BNP: BNP (last 3 results) Recent Labs    12/22/2021 1737  BNP 2,812.3*    ProBNP (last 3 results) No results for input(s): PROBNP in the last 8760 hours.   D-Dimer No results for input(s): DDIMER in the last 72 hours. Hemoglobin A1C No results for input(s):  HGBA1C in the last 72 hours. Fasting Lipid Panel No results for input(s): CHOL, HDL, LDLCALC, TRIG, CHOLHDL, LDLDIRECT in the last 72 hours. Thyroid Function Tests No results for input(s): TSH, T4TOTAL, T3FREE, THYROIDAB in the last 72 hours.  Invalid input(s): FREET3   Other results:   Imaging    No results found.   Medications:     Scheduled Medications:  buPROPion  300 mg Oral Daily   Chlorhexidine Gluconate Cloth  6 each Topical Daily   colestipol  1 g Oral BID   ezetimibe  10 mg Oral Daily   feeding supplement  1 Container Oral TID BM   feeding supplement (VITAL 1.5 CAL)  1,000 mL Per Tube Q24H   ferrous sulfate  325 mg Oral Q breakfast   fluticasone  1 spray Each Nare BID   furosemide  80 mg Intravenous Daily    hydroxychloroquine  400 mg Oral QHS   levothyroxine  25 mcg Oral QAC breakfast   loratadine  10 mg Oral Daily   montelukast  10 mg Oral Daily   multivitamin with minerals  1 tablet Oral Daily   pantoprazole  40 mg Oral QHS   sodium chloride flush  10-40 mL Intracatheter Q12H   sodium chloride flush  3 mL Intravenous Q12H    Infusions:  sodium chloride     amiodarone 60 mg/hr (01/11/22 1831)   heparin 1,200 Units/hr (01/11/22 1815)   milrinone 0.125 mcg/kg/min (01/11/22 0919)    PRN Medications: sodium chloride, acetaminophen, ALPRAZolam, guaiFENesin-dextromethorphan, ipratropium, levalbuterol, loperamide, melatonin, ondansetron (ZOFRAN) IV, phenol, sodium chloride flush, sodium chloride flush, traZODone    Patient Profile   [name] is a 81 year old with a history of PAF, moderate AS, severe MR, COPD, quit smoking 25 years ago, PAD, HTN, hyperlipidemia, CVA, NICM, tobacco abuse, ischemic bowel with resection in 2017,11/04/22 left ureteral calculus S/P utreteroscopy and stent,  and chronic HFrEF.     Admitted A/C HFrEF and A fib RVR.   Assessment/Plan   A/C HFrEF - Echo this admit EF down to 25% from previous Echo EF 40-45% in 2018 .  BNP on admit > 2800.  - Has LBBB, may eventually benefit from CRT depending on progress - Cath 2/2 with minimal CAD preserved cardiac output, elevated PCWP (intermittent v-waves up to 50) suggestive of severe MR.  - TEE 2/3 LVEF 15% RV severe HK. Severe MR - on Milrinone 0.125. Co-ox 50>>61% today  - C/w volume overload. CVP 8. Continue IV Lasix 80 mg x 1 for now. Reassess this PM, may need evening dose.    2. Severe MR  - 12/28/21 TEE with severe MR - Based on cath and symptoms her MR is playing a significant role in her poor hemodynamics but the question is whether fixing the valve Lauren help her overall trajectory. Given low output and recurrent orthopnea, I think fixing the valve Lauren be important to her trajectory - Plan MitraClip 2/9     3. Persistent AF - Last dose of eliquis 01/02/2022 (morning) -> heparin - s/p DC-CV 2/3 - Back in Afib this am - Continue amio at 60/hr while on milrinone   4. AKI  - Recently had diuretics increased on 1/5. Creatinine on that time was 1.2.  - On admit creatinine was 1.7 - SCr 1.3 today. Follow w/ diuresis    5. A/C Respiratory Failure - On exam has severe underlying COPD - On triple inhalers and recently placed on oxygen 12/17/21.  - Continue nebs.  Pulmonary toilet - c/w diuresis per above    6. Anemia  - Hgb 9.4. Iron Sats 7%.  - Received feraheme 02/03  7. Hypokalemia - supp as needed with diuresis - K 3.7 today  8. 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 Hold TFs at midnight for MitraClip   9. Severe deconditioning - PT/OT following    Length of Stay: [address] PA-C  01/12/2022, 8:17 AM  Advanced Heart Failure Team Pager [phone] (M-F; 7a - 5p)  Please contact [facility] for night-coverage after hours (5p -7a ) and weekends on [email or web address]   Patient seen and examined with the above-signed Advanced Practice Provider and/or Housestaff. I personally reviewed laboratory data, imaging studies and relevant notes. I independently examined the patient and formulated the important aspects of the plan. I have edited the note to reflect any of my changes or salient points. I have personally discussed the plan with the patient and/or family.  Remains very weak and SOB despite milrinone. Back in AF. CO-ox 61% CVP 3  General:  Elderly frail appearing. SOB at rest HEENT: normal + Cor-trak Neck: supple. no JVD. Carotids 2+ bilat; no bruits. No lymphadenopathy or thryomegaly appreciated. Cor: PMI nondisplaced. Irregular rate & rhythm. 2/6 TR Lungs: coarse Abdomen: soft, nontender,  nondistended. No hepatosplenomegaly. No bruits or masses. Good bowel sounds. Extremities: no cyanosis, clubbing, rash, edema Neuro: alert & orientedx3, cranial nerves grossly intact. moves all 4 extremities w/o difficulty. Affect pleasant  She remains quite tenuous despite milrinone support and IV diuresis.I had a long talk with her and her husband regarding the fact that her progressive decompensation and that her MR is just one component of this. We discussed the fact that I believe that proceeding with MitraClip is a reasonable option given her HF symptoms and her hemodynamics but that it might not be sufficient to reverse her trajectory given her significant comorbidities. They are in agreement with proceeding in hopes that she Lauren get some symptomatic relief.  Lauren continue milrinone and IV diuresis. Continue amio for AF. Continue TFs.   D/w Dr. Ali Lowe.  Glori Bickers, MD

## 2022-01-12 NOTE — Telephone Encounter (Signed)
Previous review was of 12/28/21 TEE. Below, the checklist is for 01/10/2022 TEE by Dr. Haroldine Laws:

## 2022-01-13 ENCOUNTER — Inpatient Hospital Stay (HOSPITAL_COMMUNITY): Payer: Medicare Other

## 2022-01-13 ENCOUNTER — Inpatient Hospital Stay (HOSPITAL_COMMUNITY): Payer: Medicare Other | Admitting: Certified Registered Nurse Anesthetist

## 2022-01-13 ENCOUNTER — Encounter (HOSPITAL_COMMUNITY): Admission: EM | Disposition: E | Payer: Self-pay | Source: Home / Self Care | Attending: Family Medicine

## 2022-01-13 ENCOUNTER — Encounter (HOSPITAL_COMMUNITY): Payer: Self-pay | Admitting: Internal Medicine

## 2022-01-13 DIAGNOSIS — I340 Nonrheumatic mitral (valve) insufficiency: Secondary | ICD-10-CM

## 2022-01-13 DIAGNOSIS — Z9889 Other specified postprocedural states: Secondary | ICD-10-CM

## 2022-01-13 DIAGNOSIS — Z006 Encounter for examination for normal comparison and control in clinical research program: Secondary | ICD-10-CM

## 2022-01-13 DIAGNOSIS — I5043 Acute on chronic combined systolic (congestive) and diastolic (congestive) heart failure: Secondary | ICD-10-CM | POA: Diagnosis not present

## 2022-01-13 DIAGNOSIS — N1832 Chronic kidney disease, stage 3b: Secondary | ICD-10-CM

## 2022-01-13 DIAGNOSIS — Z95818 Presence of other cardiac implants and grafts: Secondary | ICD-10-CM

## 2022-01-13 DIAGNOSIS — I5023 Acute on chronic systolic (congestive) heart failure: Secondary | ICD-10-CM

## 2022-01-13 DIAGNOSIS — I130 Hypertensive heart and chronic kidney disease with heart failure and stage 1 through stage 4 chronic kidney disease, or unspecified chronic kidney disease: Secondary | ICD-10-CM

## 2022-01-13 DIAGNOSIS — I2510 Atherosclerotic heart disease of native coronary artery without angina pectoris: Secondary | ICD-10-CM

## 2022-01-13 HISTORY — PX: MITRAL VALVE REPAIR: CATH118311

## 2022-01-13 HISTORY — DX: Presence of other cardiac implants and grafts: Z95.818

## 2022-01-13 HISTORY — DX: Other specified postprocedural states: Z98.890

## 2022-01-13 LAB — GLUCOSE, CAPILLARY
Glucose-Capillary: 108 mg/dL — ABNORMAL HIGH (ref 70–99)
Glucose-Capillary: 115 mg/dL — ABNORMAL HIGH (ref 70–99)
Glucose-Capillary: 129 mg/dL — ABNORMAL HIGH (ref 70–99)
Glucose-Capillary: 132 mg/dL — ABNORMAL HIGH (ref 70–99)

## 2022-01-13 LAB — CBC
HCT: 33.4 % — ABNORMAL LOW (ref 36.0–46.0)
Hemoglobin: 10.4 g/dL — ABNORMAL LOW (ref 12.0–15.0)
MCH: 26.6 pg (ref 26.0–34.0)
MCHC: 31.1 g/dL (ref 30.0–36.0)
MCV: 85.4 fL (ref 80.0–100.0)
Platelets: 175 10*3/uL (ref 150–400)
RBC: 3.91 MIL/uL (ref 3.87–5.11)
RDW: 18.3 % — ABNORMAL HIGH (ref 11.5–15.5)
WBC: 7.1 10*3/uL (ref 4.0–10.5)
nRBC: 0 % (ref 0.0–0.2)

## 2022-01-13 LAB — COOXEMETRY PANEL
Carboxyhemoglobin: 1.4 % (ref 0.5–1.5)
Carboxyhemoglobin: 1.3 % (ref 0.5–1.5)
Methemoglobin: 0.8 % (ref 0.0–1.5)
Methemoglobin: 1 % (ref 0.0–1.5)
O2 Saturation: 62.9 %
O2 Saturation: 83.1 %
Total hemoglobin: 10.3 g/dL — ABNORMAL LOW (ref 12.0–16.0)
Total hemoglobin: 10.8 g/dL — ABNORMAL LOW (ref 12.0–16.0)

## 2022-01-13 LAB — BASIC METABOLIC PANEL
Anion gap: 11 (ref 5–15)
Anion gap: 13 (ref 5–15)
BUN: 19 mg/dL (ref 8–23)
BUN: 18 mg/dL (ref 8–23)
CO2: 31 mmol/L (ref 22–32)
CO2: 31 mmol/L (ref 22–32)
Calcium: 8.6 mg/dL — ABNORMAL LOW (ref 8.9–10.3)
Calcium: 8.7 mg/dL — ABNORMAL LOW (ref 8.9–10.3)
Chloride: 87 mmol/L — ABNORMAL LOW (ref 98–111)
Chloride: 85 mmol/L — ABNORMAL LOW (ref 98–111)
Creatinine, Ser: 1.34 mg/dL — ABNORMAL HIGH (ref 0.44–1.00)
Creatinine, Ser: 1.39 mg/dL — ABNORMAL HIGH (ref 0.44–1.00)
GFR, Estimated: 40 mL/min — ABNORMAL LOW (ref >60)
GFR, Estimated: 38 mL/min — ABNORMAL LOW (ref >60)
Glucose, Bld: 253 mg/dL — ABNORMAL HIGH (ref 70–99)
Glucose, Bld: 275 mg/dL — ABNORMAL HIGH (ref 70–99)
Potassium: 4 mmol/L (ref 3.5–5.1)
Potassium: 4 mmol/L (ref 3.5–5.1)
Sodium: 129 mmol/L — ABNORMAL LOW (ref 135–145)
Sodium: 129 mmol/L — ABNORMAL LOW (ref 135–145)

## 2022-01-13 LAB — CBC WITH DIFFERENTIAL/PLATELET
Abs Immature Granulocytes: 0.03 10*3/uL (ref 0.00–0.07)
Basophils Absolute: 0 10*3/uL (ref 0.0–0.1)
Basophils Relative: 0 %
Eosinophils Absolute: 0.1 10*3/uL (ref 0.0–0.5)
Eosinophils Relative: 2 %
HCT: 33.2 % — ABNORMAL LOW (ref 36.0–46.0)
Hemoglobin: 10.4 g/dL — ABNORMAL LOW (ref 12.0–15.0)
Immature Granulocytes: 0 %
Lymphocytes Relative: 13 %
Lymphs Abs: 0.9 10*3/uL (ref 0.7–4.0)
MCH: 26.6 pg (ref 26.0–34.0)
MCHC: 31.3 g/dL (ref 30.0–36.0)
MCV: 84.9 fL (ref 80.0–100.0)
Monocytes Absolute: 1.3 10*3/uL — ABNORMAL HIGH (ref 0.1–1.0)
Monocytes Relative: 18 %
Neutro Abs: 4.7 10*3/uL (ref 1.7–7.7)
Neutrophils Relative %: 67 %
Platelets: 176 10*3/uL (ref 150–400)
RBC: 3.91 MIL/uL (ref 3.87–5.11)
RDW: 18.3 % — ABNORMAL HIGH (ref 11.5–15.5)
WBC: 7.1 10*3/uL (ref 4.0–10.5)
nRBC: 0 % (ref 0.0–0.2)

## 2022-01-13 LAB — HEMOGLOBIN A1C
Hgb A1c MFr Bld: 5.3 % (ref 4.8–5.6)
Mean Plasma Glucose: 105.41 mg/dL

## 2022-01-13 LAB — POCT I-STAT 7, (LYTES, BLD GAS, ICA,H+H)
Acid-Base Excess: 10 mmol/L — ABNORMAL HIGH (ref 0.0–2.0)
Acid-Base Excess: 11 mmol/L — ABNORMAL HIGH (ref 0.0–2.0)
Bicarbonate: 34 mmol/L — ABNORMAL HIGH (ref 20.0–28.0)
Bicarbonate: 35.9 mmol/L — ABNORMAL HIGH (ref 20.0–28.0)
Calcium, Ion: 1.21 mmol/L (ref 1.15–1.40)
Calcium, Ion: 1.21 mmol/L (ref 1.15–1.40)
HCT: 32 % — ABNORMAL LOW (ref 36.0–46.0)
HCT: 36 % (ref 36.0–46.0)
Hemoglobin: 10.9 g/dL — ABNORMAL LOW (ref 12.0–15.0)
Hemoglobin: 12.2 g/dL (ref 12.0–15.0)
O2 Saturation: 99 %
O2 Saturation: 96 %
Patient temperature: 98.1
Potassium: 4.2 mmol/L (ref 3.5–5.1)
Potassium: 3.7 mmol/L (ref 3.5–5.1)
Sodium: 127 mmol/L — ABNORMAL LOW (ref 135–145)
Sodium: 128 mmol/L — ABNORMAL LOW (ref 135–145)
TCO2: 35 mmol/L — ABNORMAL HIGH (ref 22–32)
TCO2: 37 mmol/L — ABNORMAL HIGH (ref 22–32)
pCO2 arterial: 43.5 mmHg (ref 32.0–48.0)
pCO2 arterial: 46.7 mmHg (ref 32.0–48.0)
pH, Arterial: 7.501 — ABNORMAL HIGH (ref 7.350–7.450)
pH, Arterial: 7.493 — ABNORMAL HIGH (ref 7.350–7.450)
pO2, Arterial: 123 mmHg — ABNORMAL HIGH (ref 83.0–108.0)
pO2, Arterial: 79 mmHg — ABNORMAL LOW (ref 83.0–108.0)

## 2022-01-13 LAB — ECHO TEE
Height: 64 in
MV M vel: 4.2 m/s
MV Peak grad: 70.6 mmHg
Radius: 0.7 cm
Weight: 1915.36 oz

## 2022-01-13 LAB — SURGICAL PCR SCREEN
MRSA, PCR: NEGATIVE
Staphylococcus aureus: NEGATIVE

## 2022-01-13 LAB — HEPARIN LEVEL (UNFRACTIONATED): Heparin Unfractionated: 0.32 IU/mL (ref 0.30–0.70)

## 2022-01-13 LAB — PROTIME-INR
INR: 1.1 (ref 0.8–1.2)
Prothrombin Time: 14.1 seconds (ref 11.4–15.2)

## 2022-01-13 LAB — POCT ACTIVATED CLOTTING TIME
Activated Clotting Time: 227 seconds
Activated Clotting Time: 257 seconds
Activated Clotting Time: 311 seconds

## 2022-01-13 LAB — TYPE AND SCREEN
ABO/RH(D): O POS
Antibody Screen: NEGATIVE

## 2022-01-13 LAB — PHOSPHORUS: Phosphorus: 4 mg/dL (ref 2.5–4.6)

## 2022-01-13 LAB — MAGNESIUM: Magnesium: 1.9 mg/dL (ref 1.7–2.4)

## 2022-01-13 SURGERY — MITRAL VALVE REPAIR
Anesthesia: General

## 2022-01-13 MED ORDER — DOCUSATE SODIUM 50 MG/5ML PO LIQD
100.0000 mg | Freq: Two times a day (BID) | ORAL | Status: DC
  Administered 2022-01-14 (×2): 100 mg
  Filled 2022-01-13 (×2): qty 10

## 2022-01-13 MED ORDER — SODIUM CHLORIDE 0.9% FLUSH: 3.0000 mL | INTRAVENOUS | Status: DC | PRN

## 2022-01-13 MED ORDER — LOPERAMIDE HCL 2 MG PO CAPS: 2.0000 mg | ORAL_CAPSULE | Freq: Two times a day (BID) | ORAL | Status: DC

## 2022-01-13 MED ORDER — FENTANYL CITRATE PF 50 MCG/ML IJ SOSY: 25.0000 ug | PREFILLED_SYRINGE | Freq: Once | INTRAMUSCULAR | Status: DC

## 2022-01-13 MED ORDER — LORATADINE 10 MG PO TABS
10.0000 mg | ORAL_TABLET | Freq: Every day | ORAL | Status: DC
  Administered 2022-01-14 – 2022-01-19 (×6): 10 mg
  Filled 2022-01-13 (×6): qty 1

## 2022-01-13 MED ORDER — HEPARIN (PORCINE) IN NACL 2000-0.9 UNIT/L-% IV SOLN
INTRAVENOUS | Status: DC | PRN
  Administered 2022-01-13 (×3): 1000 mL

## 2022-01-13 MED ORDER — GUAIFENESIN-DM 100-10 MG/5ML PO SYRP
5.0000 mL | ORAL_SOLUTION | ORAL | Status: DC | PRN
  Administered 2022-01-14 – 2022-01-19 (×4): 5 mL
  Filled 2022-01-13 (×4): qty 5

## 2022-01-13 MED ORDER — FENTANYL 2500MCG IN NS 250ML (10MCG/ML) PREMIX INFUSION
25.0000 ug/h | INTRAVENOUS | Status: DC
  Administered 2022-01-13: 25 ug/h via INTRAVENOUS
  Filled 2022-01-13: qty 250

## 2022-01-13 MED ORDER — LABETALOL HCL 5 MG/ML IV SOLN: 10.0000 mg | INTRAVENOUS | Status: DC | PRN

## 2022-01-13 MED ORDER — HYDROXYCHLOROQUINE SULFATE 200 MG PO TABS
400.0000 mg | ORAL_TABLET | Freq: Every day | ORAL | Status: DC
  Administered 2022-01-14 – 2022-01-18 (×6): 400 mg
  Filled 2022-01-13 (×6): qty 2

## 2022-01-13 MED ORDER — POLYETHYLENE GLYCOL 3350 17 G PO PACK
17.0000 g | PACK | Freq: Every day | ORAL | Status: DC
  Administered 2022-01-14: 17 g
  Filled 2022-01-13: qty 1

## 2022-01-13 MED ORDER — COLESTIPOL HCL 1 G PO TABS
1.0000 g | ORAL_TABLET | Freq: Two times a day (BID) | ORAL | Status: DC
  Administered 2022-01-14 – 2022-01-19 (×12): 1 g
  Filled 2022-01-13 (×13): qty 1

## 2022-01-13 MED ORDER — SODIUM CHLORIDE 0.9 % IV SOLN: 250.0000 mL | INTRAVENOUS | Status: DC | PRN

## 2022-01-13 MED ORDER — HEPARIN (PORCINE) IN NACL 1000-0.9 UT/500ML-% IV SOLN
INTRAVENOUS | Status: AC
  Filled 2022-01-13: qty 500

## 2022-01-13 MED ORDER — PANTOPRAZOLE SODIUM 40 MG IV SOLR
40.0000 mg | Freq: Every day | INTRAVENOUS | Status: DC
  Administered 2022-01-13 – 2022-01-18 (×6): 40 mg via INTRAVENOUS
  Filled 2022-01-13 (×6): qty 10

## 2022-01-13 MED ORDER — SODIUM CHLORIDE 0.9% FLUSH
3.0000 mL | Freq: Two times a day (BID) | INTRAVENOUS | Status: DC
  Administered 2022-01-13 – 2022-01-18 (×7): 3 mL via INTRAVENOUS

## 2022-01-13 MED ORDER — ACETAMINOPHEN 325 MG PO TABS
650.0000 mg | ORAL_TABLET | ORAL | Status: DC | PRN
  Administered 2022-01-14 – 2022-01-17 (×3): 650 mg via ORAL
  Filled 2022-01-13 (×3): qty 2

## 2022-01-13 MED ORDER — TRAZODONE HCL 50 MG PO TABS
50.0000 mg | ORAL_TABLET | Freq: Every evening | ORAL | Status: DC | PRN
  Administered 2022-01-14 – 2022-01-17 (×4): 50 mg
  Filled 2022-01-13 (×4): qty 1

## 2022-01-13 MED ORDER — FENTANYL CITRATE (PF) 100 MCG/2ML IJ SOLN
INTRAMUSCULAR | Status: DC | PRN
  Administered 2022-01-13: 25 ug via INTRAVENOUS

## 2022-01-13 MED ORDER — MONTELUKAST SODIUM 10 MG PO TABS
10.0000 mg | ORAL_TABLET | Freq: Every day | ORAL | Status: DC
  Administered 2022-01-14 – 2022-01-19 (×6): 10 mg
  Filled 2022-01-13 (×7): qty 1

## 2022-01-13 MED ORDER — LEVOTHYROXINE SODIUM 25 MCG PO TABS
25.0000 ug | ORAL_TABLET | Freq: Every day | ORAL | Status: DC
  Administered 2022-01-14 – 2022-01-19 (×6): 25 ug
  Filled 2022-01-13 (×6): qty 1

## 2022-01-13 MED ORDER — FUROSEMIDE 10 MG/ML IJ SOLN
INTRAMUSCULAR | Status: AC
  Filled 2022-01-13: qty 8

## 2022-01-13 MED ORDER — PROTAMINE SULFATE 10 MG/ML IV SOLN
INTRAVENOUS | Status: DC | PRN
  Administered 2022-01-13: 30 mg via INTRAVENOUS

## 2022-01-13 MED ORDER — INSULIN ASPART 100 UNIT/ML IJ SOLN
0.0000 [IU] | INTRAMUSCULAR | Status: DC
  Administered 2022-01-13: 1 [IU] via SUBCUTANEOUS
  Administered 2022-01-14: 2 [IU] via SUBCUTANEOUS
  Administered 2022-01-14 (×5): 1 [IU] via SUBCUTANEOUS
  Administered 2022-01-15: 2 [IU] via SUBCUTANEOUS
  Administered 2022-01-15: 1 [IU] via SUBCUTANEOUS
  Administered 2022-01-15: 2 [IU] via SUBCUTANEOUS
  Administered 2022-01-15: 1 [IU] via SUBCUTANEOUS
  Administered 2022-01-15: 2 [IU] via SUBCUTANEOUS
  Administered 2022-01-16 (×2): 1 [IU] via SUBCUTANEOUS
  Administered 2022-01-16: 2 [IU] via SUBCUTANEOUS
  Administered 2022-01-16 – 2022-01-18 (×7): 1 [IU] via SUBCUTANEOUS

## 2022-01-13 MED ORDER — PROPOFOL 10 MG/ML IV BOLUS
INTRAVENOUS | Status: DC | PRN
Start: 2022-01-13 — End: 2022-01-13
  Administered 2022-01-13: 20 mg via INTRAVENOUS
  Administered 2022-01-13: 70 mg via INTRAVENOUS
  Administered 2022-01-13: 20 mg via INTRAVENOUS

## 2022-01-13 MED ORDER — PROPOFOL 500 MG/50ML IV EMUL
INTRAVENOUS | Status: DC | PRN
  Administered 2022-01-13: 50 ug/kg/min via INTRAVENOUS

## 2022-01-13 MED ORDER — ONDANSETRON HCL 4 MG/2ML IJ SOLN: 4.0000 mg | Freq: Four times a day (QID) | INTRAMUSCULAR | Status: DC | PRN

## 2022-01-13 MED ORDER — PHENYLEPHRINE 40 MCG/ML (10ML) SYRINGE FOR IV PUSH (FOR BLOOD PRESSURE SUPPORT)
PREFILLED_SYRINGE | INTRAVENOUS | Status: DC | PRN
  Administered 2022-01-13: 160 ug via INTRAVENOUS
  Administered 2022-01-13 (×2): 120 ug via INTRAVENOUS

## 2022-01-13 MED ORDER — HEPARIN (PORCINE) IN NACL 2000-0.9 UNIT/L-% IV SOLN
INTRAVENOUS | Status: AC
  Filled 2022-01-13: qty 3000

## 2022-01-13 MED ORDER — EZETIMIBE 10 MG PO TABS
10.0000 mg | ORAL_TABLET | Freq: Every day | ORAL | Status: DC
  Administered 2022-01-14 – 2022-01-16 (×3): 10 mg
  Filled 2022-01-13 (×4): qty 1

## 2022-01-13 MED ORDER — OSMOLITE 1.2 CAL PO LIQD
1000.0000 mL | ORAL | Status: DC
  Filled 2022-01-13 (×2): qty 1000

## 2022-01-13 MED ORDER — HEPARIN (PORCINE) 25000 UT/250ML-% IV SOLN
1450.0000 [IU]/h | INTRAVENOUS | Status: DC
  Administered 2022-01-13 – 2022-01-18 (×6): 1300 [IU]/h via INTRAVENOUS
  Administered 2022-01-19: 1450 [IU]/h via INTRAVENOUS
  Filled 2022-01-13 (×8): qty 250

## 2022-01-13 MED ORDER — DEXMEDETOMIDINE HCL IN NACL 400 MCG/100ML IV SOLN
0.0000 ug/kg/h | INTRAVENOUS | Status: DC
  Administered 2022-01-13: 0.4 ug/kg/h via INTRAVENOUS
  Administered 2022-01-14: 1.2 ug/kg/h via INTRAVENOUS
  Administered 2022-01-14: 0.8 ug/kg/h via INTRAVENOUS
  Filled 2022-01-13 (×2): qty 100

## 2022-01-13 MED ORDER — MIDAZOLAM HCL 2 MG/2ML IJ SOLN
INTRAMUSCULAR | Status: DC | PRN
  Administered 2022-01-13: 1 mg via INTRAVENOUS

## 2022-01-13 MED ORDER — ARFORMOTEROL TARTRATE 15 MCG/2ML IN NEBU
15.0000 ug | INHALATION_SOLUTION | Freq: Two times a day (BID) | RESPIRATORY_TRACT | Status: DC
  Administered 2022-01-13 – 2022-01-19 (×12): 15 ug via RESPIRATORY_TRACT
  Filled 2022-01-13 (×12): qty 2

## 2022-01-13 MED ORDER — FENTANYL BOLUS VIA INFUSION
25.0000 ug | INTRAVENOUS | Status: DC | PRN
  Administered 2022-01-13: 50 ug via INTRAVENOUS
  Administered 2022-01-13: 100 ug via INTRAVENOUS
  Administered 2022-01-13: 50 ug via INTRAVENOUS
  Administered 2022-01-14 (×3): 100 ug via INTRAVENOUS
  Administered 2022-01-14: 50 ug via INTRAVENOUS
  Filled 2022-01-13: qty 100

## 2022-01-13 MED ORDER — FUROSEMIDE 10 MG/ML IJ SOLN
INTRAMUSCULAR | Status: DC | PRN
  Administered 2022-01-13: 80 mg via INTRAMUSCULAR

## 2022-01-13 MED ORDER — BUDESONIDE 0.25 MG/2ML IN SUSP
0.2500 mg | Freq: Two times a day (BID) | RESPIRATORY_TRACT | Status: DC
  Administered 2022-01-13 – 2022-01-19 (×12): 0.25 mg via RESPIRATORY_TRACT
  Filled 2022-01-13 (×13): qty 2

## 2022-01-13 MED ORDER — ACETAMINOPHEN 325 MG PO TABS: 650.0000 mg | ORAL_TABLET | ORAL | Status: DC | PRN

## 2022-01-13 MED ORDER — HYDRALAZINE HCL 20 MG/ML IJ SOLN: 5.0000 mg | INTRAMUSCULAR | Status: DC | PRN

## 2022-01-13 MED ORDER — LIDOCAINE 2% (20 MG/ML) 5 ML SYRINGE
INTRAMUSCULAR | Status: DC | PRN
  Administered 2022-01-13: 40 mg via INTRAVENOUS

## 2022-01-13 MED ORDER — ADULT MULTIVITAMIN W/MINERALS CH
1.0000 | ORAL_TABLET | Freq: Every day | ORAL | Status: DC
  Administered 2022-01-14 – 2022-01-19 (×6): 1
  Filled 2022-01-13 (×6): qty 1

## 2022-01-13 MED ORDER — ALPRAZOLAM 0.25 MG PO TABS
0.2500 mg | ORAL_TABLET | Freq: Two times a day (BID) | ORAL | Status: DC | PRN
  Administered 2022-01-14 – 2022-01-15 (×2): 0.25 mg
  Filled 2022-01-13 (×2): qty 1

## 2022-01-13 MED ORDER — NOREPINEPHRINE 4 MG/250ML-% IV SOLN
0.0000 ug/min | INTRAVENOUS | Status: DC
  Administered 2022-01-13: 2 ug/min via INTRAVENOUS
  Administered 2022-01-14: 7 ug/min via INTRAVENOUS
  Administered 2022-01-14: 5 ug/min via INTRAVENOUS
  Administered 2022-01-15: 13 ug/min via INTRAVENOUS
  Administered 2022-01-15: 11 ug/min via INTRAVENOUS
  Administered 2022-01-15: 5.013 ug/min via INTRAVENOUS
  Administered 2022-01-15: 10 ug/min via INTRAVENOUS
  Administered 2022-01-16: 16 ug/min via INTRAVENOUS
  Administered 2022-01-16: 7.013 ug/min via INTRAVENOUS
  Administered 2022-01-17: 16 ug/min via INTRAVENOUS
  Administered 2022-01-17: 14 ug/min via INTRAVENOUS
  Administered 2022-01-17: 11 ug/min via INTRAVENOUS
  Administered 2022-01-18 (×2): 12 ug/min via INTRAVENOUS
  Administered 2022-01-19: 4 ug/min via INTRAVENOUS
  Filled 2022-01-13 (×18): qty 250

## 2022-01-13 MED ORDER — ONDANSETRON HCL 4 MG/2ML IJ SOLN
INTRAMUSCULAR | Status: DC | PRN
  Administered 2022-01-13: 4 mg via INTRAVENOUS

## 2022-01-13 MED ORDER — HEPARIN (PORCINE) IN NACL 1000-0.9 UT/500ML-% IV SOLN
INTRAVENOUS | Status: DC | PRN
  Administered 2022-01-13: 500 mL

## 2022-01-13 MED ORDER — LACTATED RINGERS IV SOLN: INTRAVENOUS | Status: DC

## 2022-01-13 MED ORDER — PHENYLEPHRINE HCL-NACL 20-0.9 MG/250ML-% IV SOLN
INTRAVENOUS | Status: DC | PRN
  Administered 2022-01-13: 80 ug/min via INTRAVENOUS

## 2022-01-13 MED ORDER — REVEFENACIN 175 MCG/3ML IN SOLN
175.0000 ug | Freq: Every day | RESPIRATORY_TRACT | Status: DC
  Administered 2022-01-14 – 2022-01-19 (×6): 175 ug via RESPIRATORY_TRACT
  Filled 2022-01-13 (×7): qty 3

## 2022-01-13 MED ORDER — ROCURONIUM BROMIDE 10 MG/ML (PF) SYRINGE
PREFILLED_SYRINGE | INTRAVENOUS | Status: DC | PRN
  Administered 2022-01-13: 40 mg via INTRAVENOUS
  Administered 2022-01-13: 20 mg via INTRAVENOUS
  Administered 2022-01-13: 40 mg via INTRAVENOUS

## 2022-01-13 MED ORDER — HEPARIN SODIUM (PORCINE) 1000 UNIT/ML IJ SOLN
INTRAMUSCULAR | Status: DC | PRN
  Administered 2022-01-13 (×2): 4000 [IU] via INTRAVENOUS
  Administered 2022-01-13: 8000 [IU] via INTRAVENOUS

## 2022-01-13 SURGICAL SUPPLY — 15 items
CATH MITRA STEERABLE GUIDE (CATHETERS) ×1 IMPLANT
CLIP MITRA G4 DELIVERY SYS NTW (Clip) ×1 IMPLANT
CLOSURE PERCLOSE PROSTYLE (VASCULAR PRODUCTS) ×2 IMPLANT
KIT DILATOR VASC 18G NDL (KITS) ×1 IMPLANT
KIT HEART LEFT (KITS) ×4 IMPLANT
KIT VERSACROSS LRG ACCESS (CATHETERS) ×1 IMPLANT
PACK CARDIAC CATHETERIZATION (CUSTOM PROCEDURE TRAY) ×2 IMPLANT
PAD PRO RADIOLUCENT 2001M-C (PAD) ×1 IMPLANT
SHEATH PINNACLE 8F 10CM (SHEATH) ×1 IMPLANT
SHEATH PROBE COVER 6X72 (BAG) ×2 IMPLANT
STOPCOCK MORSE 400PSI 3WAY (MISCELLANEOUS) ×12 IMPLANT
SYSTEM MITRACLIP G4 (SYSTAGENIX WOUND MANAGEMENT) ×1 IMPLANT
TRANSDUCER W/STOPCOCK (MISCELLANEOUS) ×2 IMPLANT
TUBING ART PRESS 72  MALE/FEM (TUBING) ×2
TUBING ART PRESS 72 MALE/FEM (TUBING) ×1 IMPLANT

## 2022-01-13 NOTE — Op Note (Signed)
[facility]   MULTIDISCIPLINARY HEART TEAM  Date of Procedure:  01/24/2022  Preoperative Diagnosis: Severe Symptomatic Mitral Regurgitation (Stage D)  Postoperative Diagnosis: Same   Procedure Performed: Ultrasound-guided right transfemoral venous access Double PreClose right femoral vein Transseptal puncture using Bailess RF needle Mitral valve repair with MitraClip NTW  Surgeons: Lenna Sciara, MD and Sherren Mocha, MD  Echocardiographer: Eleonore Chiquito, MD  Anesthesiologist: Oleta Mouse, MD  Device Implant: Mitraclip NTW Serial # [id number]   Procedural Indication: Severe Non-rheumatic Mitral Regurgitation (Stage D)   Brief History: The patient is an 81 year old female with a nonischemic cardiomyopathy with ejection fraction approximately 20%, tobacco abuse with COPD, paroxysmal atrial fibrillation on Eliquis, hypertension, hyperlipidemia, nonobstructive coronary artery disease, and severe nonrheumatic functional mitral regurgitation who was admitted with acute on chronic systolic heart failure.  She was medically stabilized by the heart failure service.  Due to an inability to wean off inotropes with persistent NYHA class IV symptoms of dyspnea she was referred for transcatheter mitral edge-to-edge repair.  Echo Findings: Preop:  Severe LV systolic dysfunction Severe MR secondary to nonischemic cardiomyopathy, Grade 4+ Post-op:  Severe LV systolic dysfunction Mild to moderate residual MR  Procedural Details: Prep The patient is brought to the cardiac catheterization lab in the fasting state. General anesthesia is induced. The patient is prepped from the groin to chin. A foley catheter is placed. Hemodynamics are monitored via a radial artery line.   Venous Access Using ultrasound guidance, the right femoral vein is punctured. Ultrasound images are captured and stored in the patient's chart. The vein is dilated and 2 Perclose devices are deplyed at 10'  and 2' positions to 'Preclose' the femoral vein. An 8 Fr sheath is inserted.  Transseptal Puncture A Baylis Versacross wire is advanced into the SVC A Baylis transseptal dilator is advanced into the SVC, and the VersaCross RF wire is retracted into the dilator  The transseptal sheath is retracted into the RA under fluoroscopic and echo guidance to obtain position on the posterior fossa where echo measurements are made to assure appropriate access to the mitral valve. Once proper position is confirmed by echo, RF energy is delivered and the VersaCross wire is advanced into the LA without resistance. The dilator and sheath are advanced over the wire where proper position is confirmed by echo and pressure measurement Weight based IV heparin is administered and a therapeutic ACT > 250 is confirmed  Steerable Guide Catheter Insertion The VersaCross wire is positioned at the left upper pulmonary vein The femoral vein is progressively dilated and the 24 Fr Steerable guide catheter is inserted and then directed across the interatrial septum over the wire. Position is confirmed approximately 3 cm into the left atrium The guide is de-aired   MitraClip Insertion The MitraClip NTW is prepped per protocol and inserted via the introducer into the steerable guide catheter The Clip Delivery System (CDS) is advanced under fluoro and echo guidance so that the sleeve markers are evenly spaced on each side of the guide marker  MitraClip Positioning in the Left Atrium (Supravalvular Alignment) M-knob is applied to bring the Clip towards the mitral valve. Echo guidance is used to avoid contact with LA structures. The Clip arms are opened to 180 degrees 2D and 3D TEE imaging is performed in multiple planes and the Clip is positioned and aligned above the valve using standard steering techniques   Entry into the Left Ventricle and Mitral Valve Leaflet Grasp The Clip is advanced across the  mitral valve into the LV,  maintaining proper orientation The Clip arms are opened to 120 degrees and the Clip is slowly retracted  Capture of both the anterior and posterior leaflets are visualized by echo and the grippers are dropped        Procedural details: Initially the NT W clip was positioned in the middle of the A2 and P2 leaflets.  We did not think we had an adequate grasp with possibly not enough of the posterior leaflet entrapped within the clip arms.  We released this but we found that we were entrapped in the sub-chordal apparatus.  We were ultimately able to evert and pull the clip back into the left atrium.  We elected to move more lateral as most of the regurgitation seem to be lateral.  After this maneuver grasping the A2 and P2 leaflets in a more lateral position we found that the mitral regurgitation was now mild to moderate with normalization of pulmonary vein flow.  Given the calcified subchordal apparatus that would have to be negotiated if we were to move the clip more laterally we thought this was an acceptable result.  MitraClip Deployment After extensive echo evaluation, reduction in mitral regurgitation is felt to be adequate Following standard protocol, the lock line is removed after testing the lock mechanism. The lock is rechecked and is shown to be intact. The MitraClip device is deployed and the clip delivery system is removed under echo guidance with caution taken to avoid contact with LA structures.  Device Removal The clip delivery system is removed under echo guidance The steerable guide catheter is retracted into the right atrium and the interatrial septum is assessed by echo without evidence of right-to-left shunting or large ASD  Hemostasis The guide catheter is removed over a 0.035" wire and the Perclose sutures are tightened with complete hemostasis and no evidence of hematoma  Estimated blood loss: minimal  There are no immediate procedural complications. The patient is transferred  to the post-procedure recovery area in stable condition.   Early Osmond 01/10/2022 4:38 PM

## 2022-01-13 NOTE — Progress Notes (Addendum)
ANTICOAGULATION CONSULT NOTE - Follow Up Consult  Pharmacy Consult for IV Heparin Indication: atrial fibrillation  Labs: Recent Labs    01/11/22 0547 01/12/22 0350 01/12/2022 0000 01/16/2022 0500 01/26/2022 1448  HGB 10.1* 9.4* 10.4* 10.4* 10.9*  HCT 32.7* 29.6* 33.4* 33.2* 32.0*  PLT 181 160 175 176  --   LABPROT  --   --  14.1  --   --   INR  --   --  1.1  --   --   HEPARINUNFRC 0.27* 0.32  --  0.32  --   CREATININE 1.19* 1.33* 1.34* 1.39*  --     Assessment: 81 yr old female on apixaban  2.5 mg BID PTA for atrial fibrillation (apixaban was left off med rec and not continued on admit through ED on 1/30). Patient underwent TEE/DCCV 01/26/2022 and MitraClip today (01/31/2022).  Heparin level this morning was at goal (0.32 units/ml) on heparin infusion at 1300 units/hr. No infusion issues or s/sx of bleeding per RN. H/H 10.9/32.0, plt 176 (CBC stable).  Pharmacy is consulted to resume heparin infusion 4 hrs post procedure (2000 PM).   Goal of Therapy:  Heparin level 0.3-0.7 units/ml   Plan:  Resume heparin infusion at 1300 units/hr at 2000 this evening (~4 hrs post procedure) Check heparin level 8 hrs after resuming heparin infusion Daily heparin level and CBC while on heparin  [name], PharmD, BCPS, [facility] Clinical Pharmacist 01/25/2022 4:27 PM

## 2022-01-13 NOTE — Progress Notes (Signed)
Progress Note  Patient Name: [name] Date of Encounter: 01/21/2022  [facility] Cardiologist: Will Meredith Leeds, MD   Subjective   No acute events overnight. Breathing a bit better today.  Inpatient Medications    Scheduled Meds:  [MAR Hold] buPROPion  300 mg Oral Daily   chlorhexidine  15 mL Mouth/Throat Once   [MAR Hold] Chlorhexidine Gluconate Cloth  6 each Topical Daily   [MAR Hold] colestipol  1 g Oral BID   [MAR Hold] ezetimibe  10 mg Oral Daily   [MAR Hold] feeding supplement  1 Container Oral TID BM   [MAR Hold] feeding supplement (VITAL 1.5 CAL)  1,000 mL Per Tube Q24H   [MAR Hold] ferrous sulfate  325 mg Oral Q breakfast   [MAR Hold] fluticasone  1 spray Each Nare BID   [MAR Hold] furosemide  80 mg Intravenous Daily   [MAR Hold] hydroxychloroquine  400 mg Oral QHS   [MAR Hold] levothyroxine  25 mcg Oral QAC breakfast   [MAR Hold] loratadine  10 mg Oral Daily   magnesium sulfate  40 mEq Other To OR   [MAR Hold] montelukast  10 mg Oral Daily   [MAR Hold] multivitamin with minerals  1 tablet Oral Daily   [MAR Hold] pantoprazole  40 mg Oral QHS   potassium chloride  80 mEq Other To OR   [MAR Hold] sodium chloride flush  10-40 mL Intracatheter Q12H   [MAR Hold] sodium chloride flush  3 mL Intravenous Q12H   Continuous Infusions:  [MAR Hold] sodium chloride     amiodarone 60 mg/hr (01/20/2022 1345)    ceFAZolin (ANCEF) IV     dexmedetomidine     heparin 30,000 units/NS 1000 mL solution for CELLSAVER     heparin Stopped (01/11/2022 1200)   lactated ringers     milrinone 0.125 mcg/kg/min (01/26/2022 1345)   norepinephrine (LEVOPHED) Adult infusion     PRN Meds: [MAR Hold] sodium chloride, [MAR Hold] acetaminophen, [MAR Hold] ALPRAZolam, [MAR Hold] guaiFENesin-dextromethorphan, [MAR Hold] ipratropium, [MAR Hold] levalbuterol, [MAR Hold] loperamide, [MAR Hold] melatonin, [MAR Hold] ondansetron (ZOFRAN) IV, [MAR Hold] phenol, [MAR Hold] sodium chloride flush,  [MAR Hold] sodium chloride flush, [MAR Hold] traZODone   Vital Signs    Vitals:   01/10/2022 1236 01/24/2022 1240 01/17/2022 1243 01/18/2022 1245  BP: (!) 96/46 (!) 87/62 (!) 93/56   Pulse: 95  96   Resp: (!) 22 (!) 23 20   Temp: 98 F (36.7 C)     TempSrc: Oral     SpO2: 96% 96% 95% 94%  Weight:      Height:        Intake/Output Summary (Last 24 hours) at 01/12/2022 1359 Last data filed at 01/18/2022 1000 Gross per 24 hour  Intake 525.96 ml  Output 825 ml  Net -299.04 ml   Last 3 Weights 01/16/2022 01/12/2022 01/11/2022  Weight (lbs) 119 lb 11.4 oz 121 lb 7.6 oz 120 lb 2.4 oz  Weight (kg) 54.3 kg 55.1 kg 54.5 kg      Telemetry    AF- Personally Reviewed  ECG    N/A - Personally Reviewed  Physical Exam   GEN: No acute distress.   Neck: +JVD Cardiac: irregular RR, no murmurs, rubs, or gallops.  Respiratory: Crackles bilaterally GI: Soft, nontender, non-distended  MS: No edema; No deformity. Neuro:  Nonfocal  Psych: Normal affect   Labs    High Sensitivity Troponin:   Recent Labs  Lab 12/21/2021 1913 01/04/2022  2113  TROPONINIHS 13 13     Chemistry Recent Labs  Lab 01/12/22 0350 01/12/22 1702 01/13/2022 0000 01/12/2022 0500  NA 132*  --  129* 129*  K 3.7  --  4.0 4.0  CL 92*  --  87* 85*  CO2 29  --  31 31  GLUCOSE 128*  --  253* 275*  BUN 16  --  19 18  CREATININE 1.33*  --  1.34* 1.39*  CALCIUM 8.6*  --  8.6* 8.7*  MG 2.1 2.0  --  1.9  GFRNONAA 40*  --  40* 38*  ANIONGAP 11  --  11 13    Lipids No results for input(s): CHOL, TRIG, HDL, LABVLDL, LDLCALC, CHOLHDL in the last 168 hours.  Hematology Recent Labs  Lab 01/12/22 0350 01/17/2022 0000 01/07/2022 0500  WBC 7.3 7.1 7.1  RBC 3.48* 3.91 3.91  HGB 9.4* 10.4* 10.4*  HCT 29.6* 33.4* 33.2*  MCV 85.1 85.4 84.9  MCH 27.0 26.6 26.6  MCHC 31.8 31.1 31.3  RDW 18.0* 18.3* 18.3*  PLT 160 175 176   Thyroid No results for input(s): TSH, FREET4 in the last 168 hours.  BNPNo results for input(s): BNP, PROBNP in the  last 168 hours.  DDimer No results for input(s): DDIMER in the last 168 hours.   Radiology    DG Chest 2 View  Result Date: 01/27/2022 CLINICAL DATA:  Preoperative examination EXAM: CHEST - 2 VIEW COMPARISON:  01/05/2022 chest radiograph. FINDINGS: Enteric tube enters stomach with the tip not seen on this image. Right PICC terminates over the middle third of the SVC. Stable cardiomediastinal silhouette with mild cardiomegaly. No pneumothorax. Small bilateral pleural effusions, left greater than right, stable. Patchy consolidation in the upper perihilar lungs bilaterally, right greater than left, largely new. Similar mild-to-moderate bibasilar atelectasis, left greater than right. IMPRESSION: 1. Mild cardiomegaly. New patchy consolidation in the upper perihilar lungs bilaterally, right greater than left, differential includes multilobar pneumonia versus asymmetric pulmonary edema. 2. Stable small bilateral pleural effusions and bibasilar atelectasis, left greater than right. Electronically Signed   By: Ilona Sorrel M.D.   On: 01/28/2022 08:09    Cardiac Studies   TEE 2/3 reviewed  Patient Profile     [name] is a 81 year old with a history of PAF, moderate AS, severe MR, COPD, quit smoking 25 years ago, PAD, HTN, hyperlipidemia, CVA, NICM, tobacco abuse, ischemic bowel with resection in 2017,11/04/22 left ureteral calculus S/P utreteroscopy and stent,  and chronic HFrEF.    Assessment & Plan     Acute on chronic systolic heart failure:  Patient remains tenuous and very symptomatic despite diuresis overnight.  After multidisciplinary review, will refer for (salvage) Mitraclip therapy today.  As discussed previously, will tolerate higher gradient for significant MR reduction.  Certainly she has other issues including deconditioning, malnutrition, and severe LV dysfunction that will need to be addressed following the procedure.  Patient and family agree with plan.     For questions or updates,  please contact [place] Please consult [email or web address] for contact info under        Signed, Early Osmond, MD  01/12/2022, 1:59 PM

## 2022-01-13 NOTE — Anesthesia Preprocedure Evaluation (Addendum)
Anesthesia Evaluation  Patient identified by MRN, date of birth, ID band Patient awake    Reviewed: Allergy & Precautions, NPO status , Patient's Chart, lab work & pertinent test results  History of Anesthesia Complications Negative for: history of anesthetic complications  Airway Mallampati: III  TM Distance: >3 FB Neck ROM: Full    Dental  (+) Dental Advisory Given   Pulmonary shortness of breath, at rest and lying, COPD, former smoker,    breath sounds clear to auscultation       Cardiovascular hypertension, Pt. on medications + CAD and +CHF   Rhythm:Regular    Prox RCA to Mid RCA lesion is 50% stenosed.   Prox LAD to Mid LAD lesion is 20% stenosed.   Ost Cx to Prox Cx lesion is 20% stenosed.   The left ventricular ejection fraction is less than 25% by visual estimate.  Findings:  Ao  = 97/64 (77) LV = 100/14 RA = 5 RV = 41/4 PA = 46/22 (34) PCW = 24 (intermittent v waves to 50) Fick cardiac output/index = 4.2/2.7 PVR = 1.7 WU Ao sat = 99% PA sat = 64%, 66% PAPi = 4.8  Assessment: 1. Minimal non-obstructive CAD 2. Severe NICM EF ~20% 3. Elevated PCWP with intermittent v-waves to 50 suggestive of severe MR  Plan/Discussion:  Continue medical therapy. Will plan DC-CV of AF in am. Continue amio.   Structural team following for possible MitraClip.   1. Left ventricular ejection fraction, by estimation, is 25 to 30%. The  left ventricle has severely decreased function. The left ventricle  demonstrates global hypokinesis.  2. Right ventricular systolic function is normal. The right ventricular  size is normal.  3. No left atrial/left atrial appendage thrombus was detected.  4. The mitral valve is grossly normal but does not coapt completely.  Severe mitral valve regurgitation. 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 Systolic flow reversal appreciated in  left upper  pulmonary vein. Mild mitral  stenosis. The mean mitral valve gradient is 5.0 mmHg.  5. Tricuspid valve regurgitation is mild to moderate.  6. The aortic valve is tricuspid. Aortic valve regurgitation is not  visualized. Mild to moderate aortic valve stenosis. Aortic valve area, by  VTI measures 1.28 cm. Aortic valve mean gradient measures 7.5 mmHg.  Aortic valve Vmax measures 2.05 m/s.  7. Shunting seen with color flow left to right. Agitated saline contrast  bubble study was negative, with no evidence of any interatrial shunt.  There is a patent foramen ovale with predominantly left to right shunting  across the atrial septum.    Neuro/Psych PSYCHIATRIC DISORDERS Anxiety Depression  Neuromuscular disease CVA    GI/Hepatic Neg liver ROS, hiatal hernia, GERD  Medicated and Controlled,  Endo/Other  negative endocrine ROSLab Results      Component                Value               Date                      HGBA1C                   5.5                 05/13/2021             Renal/GU CRFRenal diseaseLab Results      Component  Value               Date                      CREATININE               1.39 (H)            01/17/2022           Lab Results      Component                Value               Date                      K                        4.0                 01/18/2022                Musculoskeletal  (+) Arthritis ,   Abdominal   Peds  Hematology  (+) Blood dyscrasia, anemia , Lab Results      Component                Value               Date                      WBC                      7.1                 01/21/2022                HGB                      10.4 (L)            01/08/2022                HCT                      33.2 (L)            01/21/2022                MCV                      84.9                01/21/2022                PLT                      176                 01/10/2022              Anesthesia Other Findings    Reproductive/Obstetrics                            Anesthesia Physical Anesthesia Plan  ASA: 4  Anesthesia Plan: General   Post-op Pain Management:    Induction: Intravenous  PONV Risk Score and Plan: 3 and TIVA,  Propofol infusion and Treatment may vary due to age or medical condition  Airway Management Planned: Oral ETT  Additional Equipment: Arterial line  Intra-op Plan:   Post-operative Plan: Extubation in OR and Possible Post-op intubation/ventilation  Informed Consent: I have reviewed the patients History and Physical, chart, labs and discussed the procedure including the risks, benefits and alternatives for the proposed anesthesia with the patient or authorized representative who has indicated his/her understanding and acceptance.     Dental advisory given  Plan Discussed with: CRNA and Anesthesiologist  Anesthesia Plan Comments:         Anesthesia Quick Evaluation

## 2022-01-13 NOTE — Progress Notes (Signed)
To the cath lab by bed awake and alert.

## 2022-01-13 NOTE — Anesthesia Procedure Notes (Signed)
Procedure Name: Intubation Date/Time: 01/24/2022 2:24 PM Performed by: Moshe Salisbury, CRNA Pre-anesthesia Checklist: Patient identified, Emergency Drugs available, Suction available and Patient being monitored Patient Re-evaluated:Patient Re-evaluated prior to induction Oxygen Delivery Method: Circle System Utilized Preoxygenation: Pre-oxygenation with 100% oxygen Induction Type: IV induction Ventilation: Mask ventilation without difficulty Laryngoscope Size: Mac and 3 Grade View: Grade II Tube type: Oral Tube size: 7.5 mm Number of attempts: 1 Airway Equipment and Method: Stylet and Oral airway Placement Confirmation: ETT inserted through vocal cords under direct vision, positive ETCO2 and breath sounds checked- equal and bilateral Secured at: 21 cm Tube secured with: Tape Dental Injury: Teeth and Oropharynx as per pre-operative assessment

## 2022-01-13 NOTE — Progress Notes (Signed)
ANTICOAGULATION CONSULT NOTE - Follow Up Consult  Pharmacy Consult for heparin Indication: atrial fibrillation  Labs: Recent Labs    01/11/22 0547 01/12/22 0350 01/29/2022 0000 01/12/2022 0500 01/23/2022 1448  HGB 10.1* 9.4* 10.4* 10.4* 10.9*  HCT 32.7* 29.6* 33.4* 33.2* 32.0*  PLT 181 160 175 176  --   LABPROT  --   --  14.1  --   --   INR  --   --  1.1  --   --   HEPARINUNFRC 0.27* 0.32  --  0.32  --   CREATININE 1.19* 1.33* 1.34* 1.39*  --     Assessment: 81yo female who was on apix PTA 2.5mg  BID for a fib that was left off med rec and not continued on admit to ED on 1/30. Patient underwent TEE/DCCV 2/3 with plans for MitraClip.   Heparin level this morning is at goal on 1300 units/hr. No infusion issues or s/sx of bleeding per RN.   Goal of Therapy:  Heparin level 0.3-0.7 units/ml   Plan:  Continue IV heparin to 1300 units/hr. Daily heparin level and CBC while on heparin F/u plans for MitraClip today 2/9  Erin Hearing PharmD., BCPS Clinical Pharmacist 01/20/2022 3:03 PM

## 2022-01-13 NOTE — Plan of Care (Signed)
°  Problem: Education: Goal: Knowledge of General Education information will improve Description: Including pain rating scale, medication(s)/side effects and non-pharmacologic comfort measures Outcome: Progressing   Problem: Health Behavior/Discharge Planning: Goal: Ability to manage health-related needs will improve Outcome: Progressing   Problem: Clinical Measurements: Goal: Ability to maintain clinical measurements within normal limits will improve Outcome: Progressing Goal: Will remain free from infection Outcome: Progressing Goal: Diagnostic test results will improve Outcome: Progressing Goal: Respiratory complications will improve Outcome: Progressing Goal: Cardiovascular complication will be avoided Outcome: Progressing   Problem: Activity: Goal: Risk for activity intolerance will decrease Outcome: Progressing   Problem: Coping: Goal: Level of anxiety will decrease Outcome: Progressing   Problem: Pain Managment: Goal: General experience of comfort will improve Outcome: Progressing   Problem: Safety: Goal: Ability to remain free from injury will improve Outcome: Progressing   Problem: Skin Integrity: Goal: Risk for impaired skin integrity will decrease Outcome: Progressing   Problem: Nutrition: Goal: Adequate nutrition will be maintained Outcome: Not Progressing

## 2022-01-13 NOTE — Consult Note (Signed)
NAME:  [name], MRN:  [id number], DOB:  July 25, 1941, LOS: 33 ADMISSION DATE:  12/17/2021, CONSULTATION DATE:  01/07/2022 REFERRING MD:  Dahal - TRH , CHIEF COMPLAINT:  Post procedural vent management   History of Present Illness:   81 yo F PMH severe Mitral regurgitation (stage D) , pAFib, COPD, HTN, NICM, chronic HFrEF admitted 12/20/2021 to [facility] with SOB. She has been seen by cardiology and advanced heart failure this admission, and on 2/9 went for a mitraclip procedure. Following mitraclip, the patient remained intubated  PCCM is consulted in this setting   Pertinent  Medical History  Severe MR Moderate AS  NICM HFrEF COPD pAfib CAD CVA HLD  Significant Hospital Events: Including procedures, antibiotic start and stop dates in addition to other pertinent events   1/30 admit to [facility] with SOB 1/31 cardioogy consult for progressive volume overload 2/1 advanced heart failure consulted  2/4 TEE/DC-CV from Afib to NSR. LVEF 15% severe HK of RV.  2/7 milrinone added for coox 50%  2/9 back into Afib. Amio. mitraclip. Remained intubated after case. PCCM consulted   Interim History / Subjective:  Remains intubated after mitraclip   Objective   Blood pressure (!) 93/56, pulse 86, temperature 98 F (36.7 C), temperature source Oral, resp. rate 20, height 5\' 4"  (1.626 m), weight 54.3 kg, SpO2 94 %. CVP:  [4 mmHg-10 mmHg] 6 mmHg      Intake/Output Summary (Last 24 hours) at 01/14/2022 1712 Last data filed at 01/27/2022 1000 Gross per 24 hour  Intake 236.32 ml  Output 825 ml  Net -588.68 ml   Filed Weights   01/11/22 0322 01/12/22 0437 01/29/2022 0500  Weight: 54.5 kg 55.1 kg 54.3 kg    Examination: General: ill appearing elderly F intubated  HENT: NCAT ETT secure anicteric sclera Lungs: Symmetrical chest expansion mechanically ventilated  Cardiovascular: irir cap refill < 3sec  2+ pulses  Abdomen: soft ndnt + bowel sounds  Extremities: no acute deformity. No cyanosis or clubbing.  RUE PICC. Lradial art line. R fem site c/d/i Neuro: sedated.  GU: defer   Resolved Hospital Problem list     Assessment & Plan:  Acute on chronic hypoxemic respiratory failure: secondary to decompensated CHF in the setting of underlying severe COPD.  - Full vent support - CXR - ABG - VAP bundle - Hopeful for wean to extubation 2/10 - Precedex, PRN fent per PAD protocol for RASS goal -1 to -2.  - Brovana, budesonide, yupelri nebs in lieu of home Trellegy   Acute on chronic HFrEF: Previously with LVEF 45%, down to 25% this admission. Cath 2/2 with minimal CAD and elevated PCWP. Echo 2/3 with EF reduced to 15%. Severe MR - Management per cardiology/advanced heart failure esrvice - s/p Mitraclip 2/9 - Milrinone - Ongoing diuresis with lasix and metolazone   Persistent AF - Heparin per pharmacy - Amiodarone infusion - May need DC-CV   AKI - Trend BMP while diuresing.   Deconditioning - PT   Protein calorie malnutrition - Will place order to replace cortrak (dislodged 2/9)   Hypothyroid - synthroid  Best Practice (right click and "Reselect all SmartList Selections" daily)   Diet/type: NPO DVT prophylaxis: systemic heparin GI prophylaxis: PPI Lines: Central line, Arterial Line, and yes and it is still needed Foley:  N/A Code Status:  full code Last date of multidisciplinary goals of care discussion [per primary ]  Labs   CBC: Recent Labs  Lab 01/09/22 0609 01/10/22 0520 01/11/22 0547 01/12/22  0350 01/12/2022 0000 01/29/2022 0500 01/28/2022 1448  WBC 6.0 6.4 8.6 7.3 7.1 7.1  --   NEUTROABS 4.4 4.9 6.5 5.1  --  4.7  --   HGB 10.5* 10.4* 10.1* 9.4* 10.4* 10.4* 10.9*  HCT 33.3* 33.9* 32.7* 29.6* 33.4* 33.2* 32.0*  MCV 83.9 86.0 84.1 85.1 85.4 84.9  --   PLT 151 169 181 160 175 176  --     Basic Metabolic Panel: Recent Labs  Lab 01/10/22 0520 01/11/22 0547 01/12/22 0350 01/12/22 1702 01/12/2022 0000 01/20/2022 0500 01/16/2022 1448  NA 133* 129* 132*  --  129*  129* 127*  K 4.3 3.6 3.7  --  4.0 4.0 4.2  CL 96* 91* 92*  --  87* 85*  --   CO2 26 27 29   --  31 31  --   GLUCOSE 225* 200* 128*  --  253* 275*  --   BUN 15 19 16   --  19 18  --   CREATININE 1.34* 1.19* 1.33*  --  1.34* 1.39*  --   CALCIUM 8.6* 8.5* 8.6*  --  8.6* 8.7*  --   MG 1.5* 2.3 2.1 2.0  --  1.9  --   PHOS  --  3.2 3.5 3.6  --  4.0  --    GFR: Estimated Creatinine Clearance: 26.7 mL/min (A) (by C-G formula based on SCr of 1.39 mg/dL (H)). Recent Labs  Lab 01/11/22 0547 01/12/22 0350 01/28/2022 0000 02/01/2022 0500  WBC 8.6 7.3 7.1 7.1    Liver Function Tests: No results for input(s): AST, ALT, ALKPHOS, BILITOT, PROT, ALBUMIN in the last 168 hours. No results for input(s): LIPASE, AMYLASE in the last 168 hours. No results for input(s): AMMONIA in the last 168 hours.  ABG    Component Value Date/Time   PHART 7.501 (H) 01/31/2022 1448   PCO2ART 43.5 01/22/2022 1448   PO2ART 123 (H) 01/07/2022 1448   HCO3 34.0 (H) 01/11/2022 1448   TCO2 35 (H) 01/12/2022 1448   ACIDBASEDEF 2.0 07/27/2016 1013   O2SAT 99.0 01/24/2022 1448     Coagulation Profile: Recent Labs  Lab 01/22/2022 0000  INR 1.1    Cardiac Enzymes: No results for input(s): CKTOTAL, CKMB, CKMBINDEX, TROPONINI in the last 168 hours.  HbA1C: Hgb A1c MFr Bld  Date/Time Value Ref Range Status  05/13/2021 10:25 AM 5.5 4.6 - 6.5 % Final    Comment:    Glycemic Control Guidelines for People with Diabetes:Non Diabetic:  <6%Goal of Therapy: <7%Additional Action Suggested:  >8%   10/29/2019 11:08 AM 5.7 4.6 - 6.5 % Final    Comment:    Glycemic Control Guidelines for People with Diabetes:Non Diabetic:  <6%Goal of Therapy: <7%Additional Action Suggested:  >8%     CBG: Recent Labs  Lab 01/12/22 1633 01/12/22 1930 01/12/22 2320 01/09/2022 0738 01/09/2022 1111  GLUCAP 107* 108* 147* 115* 108*    Review of Systems:   Unable to obtain, intubated sedated   Past Medical History:  She,  has a past medical  history of Allergic rhinitis, Anxiety, Atrial septal aneurysm, Basal cell carcinoma, Bradycardia, Cataract, Chronic combined systolic and diastolic CHF (congestive heart failure) ([place]), CKD (chronic kidney disease), stage III ([place]), Clostridium difficile infection, COPD (chronic obstructive pulmonary disease) ([place]), Depression (11/29/2013), DI (detrusor instability), Esophageal stricture, Hemorrhoids, Hiatal hernia, History of kidney stones, HLD (hyperlipidemia), HTN (hypertension), Ischemic colon ([place]), Lung nodule, Mild CAD, Nephrolithiasis, NICM (nonischemic cardiomyopathy) ([place]), Orthostatic hypotension, Osteoarthritis, PAF (paroxysmal atrial fibrillation) ([place]),  Pneumonia (2013), PVC's (premature ventricular contractions), Shingles (since Nov 18, 2013), Status post dilation of esophageal narrowing, Stroke ([place]) (2001), Syncope, and TIA (transient ischemic attack) (last 2001).   Surgical History:   Past Surgical History:  Procedure Laterality Date   ANTERIOR LATERAL LUMBAR FUSION 4 LEVELS Right 07/03/2015   Procedure: Right Lumbar one-two, Lumbar two-three, Lumbar three-four, Lumbar four-five Anterior lateral lumbar interbody fusion;  Surgeon: Erline Levine, MD;  Location: [place] [facility];  Service: Neurosurgery;  Laterality: Right;  Right L1-2 L2-3 L3-4 L4-5 Anterior lateral lumbar interbody fusion   BALLOON DILATION N/A 03/03/2014   Procedure: BALLOON DILATION;  Surgeon: Inda Castle, MD;  Location: [facility];  Service: Endoscopy;  Laterality: N/A;   BLADDER SUSPENSION     BREAST BIOPSY Right 07/03/2006   BUBBLE STUDY  12/28/2021   Procedure: BUBBLE STUDY;  Surgeon: Berniece Salines, DO;  Location: [place];  Service: Cardiovascular;;   CARDIAC CATHETERIZATION N/A 08/22/2016   Procedure: Left Heart Cath and Coronary Angiography;  Surgeon: Peter M Martinique, MD;  Location: [place] CV LAB;  Service: Cardiovascular;  Laterality: N/A;   CARDIOVERSION N/A 08/01/2016   Procedure: CARDIOVERSION;   Surgeon: Jerline Pain, MD;  Location: [place];  Service: Cardiovascular;  Laterality: N/A;   CARDIOVERSION N/A 10/11/2016   Procedure: CARDIOVERSION;  Surgeon: Sueanne Margarita, MD;  Location: [place];  Service: Cardiovascular;  Laterality: N/A;   CARDIOVERSION N/A 01/23/2022   Procedure: CARDIOVERSION;  Surgeon: Jolaine Artist, MD;  Location: [facility] ENDOSCOPY;  Service: Cardiovascular;  Laterality: N/A;   COLONOSCOPY     CYSTOSCOPY WITH RETROGRADE PYELOGRAM, URETEROSCOPY AND STENT PLACEMENT Left 11/04/2021   Procedure: CYSTOSCOPY WITH RETROGRADE PYELOGRAM, URETEROSCOPY  HOLMIUM LASER AND STENT PLACEMENT;  Surgeon: Ardis Hughs, MD;  Location: [facility];  Service: Urology;  Laterality: Left;   ESOPHAGOGASTRODUODENOSCOPY N/A 03/03/2014   Procedure: ESOPHAGOGASTRODUODENOSCOPY (EGD);  Surgeon: Inda Castle, MD;  Location: [facility];  Service: Endoscopy;  Laterality: N/A;   EXCISION OF BASAL CELL CA  2011   FINGER SURGERY     GLUTEUS MINIMUS REPAIR Right 03/15/2016   Procedure: RIGHT HIP GLUTEUS MEDIUS TENDON REPAIR;  Surgeon: Paralee Cancel, MD;  Location: [facility];  Service: Orthopedics;  Laterality: Right;   ileostomy reversal     LUMBAR PERCUTANEOUS PEDICLE SCREW 4 LEVEL Right 07/03/2015   Procedure: Lumbar one-Sacral one Bilateral percutaneous pedicle screws;  Surgeon: Erline Levine, MD;  Location: [place] [facility];  Service: Neurosurgery;  Laterality: Right;  L1-S1 Bilateral percutaneous pedicle screws   RECTOCELE REPAIR  2008   A&P REPAIR with vaginal repair-DR. MCDIARMID   RIGHT/LEFT HEART CATH AND CORONARY ANGIOGRAPHY N/A 01/12/2022   Procedure: RIGHT/LEFT HEART CATH AND CORONARY ANGIOGRAPHY;  Surgeon: Jolaine Artist, MD;  Location: [place] CV LAB;  Service: Cardiovascular;  Laterality: N/A;   TEE WITHOUT CARDIOVERSION N/A 12/28/2021   Procedure: TRANSESOPHAGEAL ECHOCARDIOGRAM (TEE);  Surgeon: Berniece Salines, DO;  Location: [facility];  Service: Cardiovascular;  Laterality:  N/A;   TEE WITHOUT CARDIOVERSION N/A 01/14/2022   Procedure: TRANSESOPHAGEAL ECHOCARDIOGRAM (TEE);  Surgeon: Jolaine Artist, MD;  Location: [place];  Service: Cardiovascular;  Laterality: N/A;   THUMB SURGERY Right 10-15 yrs ago   [place]   NO BSO-DR. MABRY   VARICOSE VEIN SURGERY       Social History:   reports that she quit smoking about 26 years ago. Her smoking use included cigarettes. She started smoking  about 67 years ago. She has a 126.00 pack-year smoking history. She has never used smokeless tobacco. She reports that she does not drink alcohol and does not use drugs.   Family History:  Her family history includes Breast cancer in her maternal aunt, maternal aunt, maternal aunt, and mother; COPD in her mother; Emphysema in her mother; Uterine cancer in her mother. There is no history of Colon cancer, Esophageal cancer, Rectal cancer, Stomach cancer, or Pancreatic cancer.   Allergies Allergies  Allergen Reactions   Clarithromycin Other (See Comments)    REACTION: possible rash but could have been legionarres dz with rash   Lipitor [Atorvastatin] Other (See Comments)    MUSCLE SPASMS   Simvastatin Other (See Comments)    Muscle and joint pain   Cardizem [Diltiazem Hcl] Hives   Contrast Media [Iodinated Contrast Media]     FYI - during admission at [facility] 08/2016 patient developed hives, question related to cardizem or contrast dye - not clear   Diltiazem     Other reaction(s): rash   Statins     Other reaction(s): leg cramps   Rosuvastatin Calcium Other (See Comments)    fagitue and joint pain, "NO STATINS"     Home Medications  Prior to Admission medications   Medication Sig Start Date End Date Taking? Authorizing Provider  albuterol (VENTOLIN HFA) 108 (90 Base) MCG/ACT inhaler Inhale 1-2 puffs into the lungs every 6 (six) hours as needed for wheezing or shortness of breath. 12/08/20  Yes Parrett,  Fonnie Mu, NP  apixaban (ELIQUIS) 2.5 MG TABS tablet Take 2.5 mg by mouth 2 (two) times daily.   Yes [provider]  buPROPion (WELLBUTRIN XL) 300 MG 24 hr tablet Take 1 tablet (300 mg total) by mouth daily. 10/26/21  Yes Biagio Borg, MD  Calcium Carbonate-Vitamin D (CALCIUM + D PO) Take 1 tablet by mouth 2 (two) times daily.   Yes [provider]  colestipol (COLESTID) 1 g tablet Take 1 g by mouth 2 (two) times daily.   Yes [provider]  ezetimibe (ZETIA) 10 MG tablet Take 1 tablet (10 mg total) by mouth daily. 10/26/21 01/24/22 Yes Biagio Borg, MD  ferrous sulfate 325 (65 FE) MG tablet Take 325 mg by mouth daily with breakfast.   Yes [provider]  fluticasone (FLONASE) 50 MCG/ACT nasal spray Place 1 spray into both nostrils 2 (two) times daily. 06/10/21  Yes Parrett, Tammy S, NP  Fluticasone-Umeclidin-Vilant (TRELEGY ELLIPTA) 100-62.5-25 MCG/INH AEPB Inhale 1 puff into the lungs daily. I inhalation once daily 12/08/20  Yes Parrett, Tammy S, NP  furosemide (LASIX) 20 MG tablet Take 2 tablets (40 mg total) by mouth daily. 12/17/21  Yes Cobb, Karie Schwalbe, NP  gabapentin (NEURONTIN) 300 MG capsule 1 tab by mouth in AM, 1 po at midday, then 2 by mouth at bedtime for sleep and pain Patient taking differently: Take 300-600 mg by mouth See admin instructions. Take 300 mg by mouth in the morning and 600 mg at night 06/17/21  Yes Biagio Borg, MD  guaiFENesin (MUCINEX) 600 MG 12 hr tablet Take 1,200 mg by mouth daily.   Yes [provider]  hydroxychloroquine (PLAQUENIL) 200 MG tablet Take 400 mg by mouth at bedtime.   Yes [provider]  ipratropium (ATROVENT) 0.03 % nasal spray Place 2 sprays into both nostrils 3 (three) times daily as needed for rhinitis. 06/10/21  Yes Parrett, Tammy S, NP  ipratropium-albuterol (DUONEB) 0.5-2.5 (3) MG/3ML  SOLN Take 3 mLs by nebulization every 6 (six) hours as needed (shortness of breath or wheezing). 12/09/21  Yes  Cobb, Karie Schwalbe, NP  levothyroxine (SYNTHROID) 25 MCG tablet Take 1 tablet (25 mcg total) by mouth daily before breakfast. Annual appt due in Nov must see provider for future refills 12/24/20  Yes Biagio Borg, MD  Lidocaine HCl 2 % CREA Apply 1 application topically daily as needed (for shingles flares).   Yes [provider]  loratadine (CLARITIN) 10 MG tablet Take 10 mg by mouth daily.   Yes [provider]  Melatonin 10 MG TABS Take 10 mg by mouth at bedtime.   Yes [provider]  metoprolol succinate (TOPROL-XL) 100 MG 24 hr tablet Take 1 tablet (100 mg total) by mouth daily. Take with or immediately following a meal. Patient taking differently: Take 100 mg by mouth every evening. Take with or immediately following a meal. 09/24/21  Yes Camnitz, Will Hassell Done, MD  mirabegron ER (MYRBETRIQ) 50 MG TB24 tablet Take 50 mg by mouth at bedtime.   Yes [provider]  montelukast (SINGULAIR) 10 MG tablet Take 1 tablet (10 mg total) by mouth daily. Annual appt due in Nov must see provider for future refills 11/05/21  Yes Biagio Borg, MD  Multiple Vitamin (MULTIVITAMIN) tablet Take 2 tablets by mouth daily.   Yes [provider]  omeprazole (PRILOSEC) 40 MG capsule Take 1 capsule (40 mg total) by mouth daily. Patient taking differently: Take 40 mg by mouth every evening. 05/28/21  Yes Biagio Borg, MD  Probiotic Product (PROBIOTIC DAILY PO) Take 1 capsule by mouth in the morning and at bedtime.   Yes [provider]  traZODone (DESYREL) 50 MG tablet Take 1 tablet (50 mg total) by mouth at bedtime and may repeat dose one time if needed. Patient taking differently: Take 100 mg by mouth at bedtime. 09/24/21  Yes Biagio Borg, MD  benzonatate (TESSALON) 200 MG capsule Take 1 capsule (200 mg total) by mouth 3 (three) times daily as needed for cough. Patient not taking: Reported on 01/04/2022 08/10/21 08/10/22  Parrett, Fonnie Mu, NP  Biotin 5 MG CAPS Take 5  mg by mouth daily.    [provider]  cyclobenzaprine (FLEXERIL) 5 MG tablet Take 1 tablet (5 mg total) by mouth 3 (three) times daily as needed for muscle spasms. Patient not taking: Reported on 01/04/2022 10/17/18   Biagio Borg, MD  furosemide (LASIX) 40 MG tablet Take 1 tablet (40 mg total) by mouth daily. Patient not taking: Reported on 12/23/2021 12/13/21   Clayton Bibles, NP  promethazine-dextromethorphan (PROMETHAZINE-DM) 6.25-15 MG/5ML syrup Take 2.5 mLs by mouth 4 (four) times daily as needed for cough. Patient not taking: Reported on 01/04/2022 12/09/21   Clayton Bibles, NP     Critical care time: 40 min       CRITICAL CARE Performed by: Cristal Generous   Critical care time was exclusive of separately billable procedures and treating other patients.  Critical care was necessary to treat or prevent imminent or life-threatening deterioration.  Critical care was time spent personally by me on the following activities: development of treatment plan with patient and/or surrogate as well as nursing, discussions with consultants, evaluation of patient's response to treatment, examination of patient, obtaining history from patient or surrogate, ordering and performing treatments and interventions, ordering and review of laboratory studies, ordering and review of radiographic studies, pulse oximetry and re-evaluation of patient's  condition.  Eliseo Gum MSN, AGACNP-BC [place] for pager  01/31/2022, 5:47 PM

## 2022-01-13 NOTE — Op Note (Signed)
[facility]   MULTIDISCIPLINARY HEART TEAM  Date of Procedure:  01/20/2022  Surgeon: Dr Ali Lowe  Co-Surgeon: Dr Burt Knack  Preoperative Diagnosis: Severe Symptomatic Mitral Regurgitation (Stage D)  Postoperative Diagnosis: Same   Procedure Performed: Ultrasound-guided right transfemoral venous access Double PreClose right femoral vein Transseptal puncture using B[place]ess RF needle Mitral valve repair with MitraClip NTW device  Echocardiographer: Marry Guan, MD  Anesthesiologist: Laurie Panda, MD  Device Implant: Mitraclip NTW positioned A2/P2  Procedural Indication: Severe Non-rheumatic Mitral Regurgitation (Stage D)   Brief History: Complex 81 year old woman with atrial fibrillation with RVR, 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  The patient is cared for by the advanced heart failure service and is medically stabilized.  She has persistent New York Heart Association functional class IV symptoms despite maximal medical therapy.  After multidisciplinary review of her case including advanced heart failure input and evaluation by structural heart interventional specialist, the patient is deemed an appropriate candidate for transcatheter edge-to-edge mitral valve repair.  Echo Findings: Preop:  Severe LV systolic dysfunction Severe 4+ MR secondary to leaflet malcoaptation, annular dilatationive calcific changes Grade 4+ Post-op:  Unchanged LV systolic function Mild to moderate residual MR  Procedural Details: Prep The patient is brought to the cardiac catheterization lab in the fasting state. General anesthesia is induced. The patient is prepped from the groin to chin. Hemodynamics are monitored via a radial artery line.   Venous Access Using ultrasound guidance, the right femoral vein is punctured. Ultrasound images are captured and stored in the  patient's chart. The vein is dilated and 2 Perclose devices are deplyed at 10' and 2' positions to 'Preclose' the femoral vein. An 8 Fr sheath is inserted.  Transseptal Puncture A [place] Versacross wire is advanced into the SVC A [place] transseptal dilator is advanced into the SVC, and the VersaCross RF wire is retracted into the dilator  The transseptal sheath is retracted into the RA under fluoroscopic and echo guidance to obtain position on the posterior fossa where echo measurements are made to assure appropriate access to the mitral valve. Once proper position is confirmed by echo, RF energy is delivered and the VersaCross wire is advanced into the LA without resistance. The dilator and sheath are advanced over the wire where proper position is confirmed by echo and pressure measurement Weight based IV heparin is administered and a therapeutic ACT > 250 is confirmed  Steerable Guide Catheter Insertion The VersaCross wire is positioned at the left upper pulmonary vein The femoral vein is progressively dilated and the 24 Fr Steerable guide catheter is inserted and then directed across the interatrial septum over the wire. Position is confirmed approximately 3 cm into the left atrium The guide is de-aired   MitraClip Insertion The MitraClip NTW is prepped per protocol and inserted via the introducer into the steerable guide catheter The Clip Delivery System (CDS) is advanced under fluoro and echo guidance so that the sleeve markers are evenly spaced on each side of the guide marker  MitraClip Positioning in the Left Atrium (Supravalvular Alignment) M-knob is applied to bring the Clip towards the mitral valve. Echo guidance is used to avoid contact with LA structures. The Clip arms are opened to 180 degrees 2D and 3D TEE imaging is performed in multiple planes and the Clip is positioned and aligned above the valve using standard steering techniques   Entry into the Left Ventricle and Mitral  Valve Leaflet  Grasp The Clip is advanced across the mitral valve into the LV, maintaining proper orientation The Clip arms are opened to 120 degrees and the Clip is slowly retracted  Capture of both the anterior and posterior leaflets are visualized by echo and the grippers are dropped.  The clip is repositioned because of inadequate posterior leaflet capture.  During clip repositioning, there is some entanglement with the chordal structures.  We were able to free up the clip and reposition it back in the middle of A2/P2 with appropriate capture of the anterior and posterior leaflet.  We tried to position the clip more laterally but there was interaction with calcified chordal structures and we were unable to safely do so.  MitraClip Deployment After extensive echo evaluation, reduction in mitral regurgitation is felt to be adequate Following standard protocol, the lock line is removed after testing the lock mechanism. The lock is rechecked and is shown to be intact. The MitraClip device is deployed and the clip delivery system is removed under echo guidance with caution taken to avoid contact with LA structures  Device Removal The clip delivery system is removed under echo guidance The steerable guide catheter is retracted into the right atrium and the interatrial septum is assessed by echo without evidence of right-to-left shunting or large ASD  Hemostasis The guide catheter is removed over a 0.035" wire and the Perclose sutures are tightened with complete hemostasis and no evidence of hematoma 30 mg of protamine is administered  Estimated blood loss: minimal  There are no immediate procedural complications. The patient is transferred to the post-procedure recovery area in stable condition.   Final conclusion: Successful transcatheter edge-to-edge repair of the mitral valve with a single MitraClip NTW device deployed A2/P2 with reduction in mitral regurgitation from 4+ at baseline down to 1-2+  post MitraClip deployment.  At baseline, there is flow reversal in the pulmonary veins.  Post MitraClip deployment, there is antegrade flow in the pulmonary veins.  Sherren Mocha 01/30/2022 5:22 PM

## 2022-01-13 NOTE — Progress Notes (Signed)
[facility] Progress Note Patient Name: [name] DOB: 11-Dec-1940 MRN: [id number]   Date of Service  02/01/2022  HPI/Events of Note  ABG results reviewed.  eICU Interventions  Pressure support / Spontaneous breathing trials in AM.        Kerry Kass [name] 01/21/2022, 9:38 PM

## 2022-01-13 NOTE — Consult Note (Signed)
01/22/2022 I have seen and evaluated the patient for respiratory failure.  S:  81 year old woman with hx of NICM, mitral failure, SSS, afib on AC, COPD who presented with decompensated heart failure.  After volume and inotrope optimization she underwent mitraclip today and remains intubated post procedure.  PCCM consulted for further management.  O: Blood pressure (!) 93/56, pulse 100, temperature 98 F (36.7 C), temperature source Oral, resp. rate 16, height 5\' 4"  (1.626 m), weight 54.3 kg, SpO2 100 %.  Frail elderly sedated woman on vent ETT in place, minimal secretions Trachea midline, JVP up Lungs with crackles at bases and diminished at bases Ext without edema, warm to touch Withdraws x 4 Pale  Preop sodium low, Cr stable AI CBC stable CXR RUL airspace disease vs. 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 s/p mitraclip POD 0 Cardiogenic shock on milrinone Cardiorenal syndrome stable Hyponatremia related to deranged ADH axis in advanced heart failure Frail elderly COPD not in flare HTN HLD Lung nodule PAF, SSS, on AC  P:  - Vent support, VAP prevention bundle - Sedation with precedex and fent titrated to RASS -1 - SSI - Supine positioning x 3 hours - Around 8PM start weaning vent - Continue triple therapy nebs  - Inotropes and diuretics per CHF and cardiology   I personally spent 34 minutes providing critical care not including any separately billable procedures  Erskine Emery MD  Pulmonary Critical Care Prefer epic messenger for cross cover needs If after hours, please call E-link

## 2022-01-13 NOTE — Progress Notes (Signed)
Not coming back to [facility], belongings given to husband.

## 2022-01-13 NOTE — Progress Notes (Addendum)
Advanced Heart Failure Rounding Note  PCP-Cardiologist: Lauren Meredith Leeds, MD   Subjective:    2/1 A fib RVR--> Started on amio drip + heparin drip. PICC placed. Diuretics held.  2/2 RHC/LHC - Had cath with preserved cardiac output, min nonob CAD, elevated PCWP suggestive of severe MR.  2/4 TEE/DC-CV  LVEF 15% RV severely HK. DC-CV to NSR 2/7 Milrinone added for low co-ox (50%). IV diuretics restarted   On Milrinone 0.125. Co-ox 63%   Remains in Afib, V-rates 90s-low 100s, on amio gtt at 60/hr  Wt down 2 lb. CVP 6-7. Scr stable, 1.39 today.   Na 133 (corrected for hyperglycemia)   Scheduled for MitraClip this afternoon   Still feels SOB but feels improved w/ Xanax (has been anxious). Denies CP. Husband present at bedside.    Objective:   Weight Range: 54.3 kg Body mass index is 20.55 kg/m.   Vital Signs:   Temp:  [97.3 F (36.3 C)-97.8 F (36.6 C)] 97.3 F (36.3 C) (02/09 0737) Pulse Rate:  [91-111] 108 (02/09 0737) Resp:  [18-29] 28 (02/09 0737) BP: (88-96)/(60-76) 88/76 (02/09 0737) SpO2:  [87 %-94 %] 90 % (02/09 0737) Weight:  [54.3 kg] 54.3 kg (02/09 0500) Last BM Date: 01/12/22  Weight change: Filed Weights   01/11/22 0322 01/12/22 0437 01/21/2022 0500  Weight: 54.5 kg 55.1 kg 54.3 kg    Intake/Output:   Intake/Output Summary (Last 24 hours) at 01/20/2022 0933 Last data filed at 01/05/2022 0800 Gross per 24 hour  Intake 905.59 ml  Output 825 ml  Net 80.59 ml      Physical Exam   CVP 6-7 General:  thin fatigued looking elderly WF. Kyphotic posture No respiratory difficulty HEENT: normal Neck: supple. JVD 7 cm. Carotids 2+ bilat; no bruits. No lymphadenopathy or thyromegaly appreciated. Cor: PMI nondisplaced. Irregularly irregular rhythm and rate. MR murmur at apex  Lungs: bibasilar crackles L>R  Abdomen: soft, nontender, nondistended. No hepatosplenomegaly. No bruits or masses. Good bowel sounds. Extremities: no cyanosis, clubbing, rash,  edema Neuro: alert & oriented x 3, cranial nerves grossly intact. moves all 4 extremities w/o difficulty. Affect pleasant.   Telemetry   Afib 90s-low 100s (personally reviewed)   Labs    CBC Recent Labs    01/12/22 0350 01/11/2022 0000 01/26/2022 0500  WBC 7.3 7.1 7.1  NEUTROABS 5.1  --  4.7  HGB 9.4* 10.4* 10.4*  HCT 29.6* 33.4* 33.2*  MCV 85.1 85.4 84.9  PLT 160 175 956   Basic Metabolic Panel Recent Labs    01/12/22 1702 01/12/2022 0000 01/20/2022 0500  NA  --  129* 129*  K  --  4.0 4.0  CL  --  87* 85*  CO2  --  31 31  GLUCOSE  --  253* 275*  BUN  --  19 18  CREATININE  --  1.34* 1.39*  CALCIUM  --  8.6* 8.7*  MG 2.0  --  1.9  PHOS 3.6  --  4.0   Liver Function Tests No results for input(s): AST, ALT, ALKPHOS, BILITOT, PROT, ALBUMIN in the last 72 hours.  No results for input(s): LIPASE, AMYLASE in the last 72 hours. Cardiac Enzymes No results for input(s): CKTOTAL, CKMB, CKMBINDEX, TROPONINI in the last 72 hours.  BNP: BNP (last 3 results) Recent Labs    12/27/2021 1737  BNP 2,812.3*    ProBNP (last 3 results) No results for input(s): PROBNP in the last 8760 hours.   D-Dimer No results for  input(s): DDIMER in the last 72 hours. Hemoglobin A1C No results for input(s): HGBA1C in the last 72 hours. Fasting Lipid Panel No results for input(s): CHOL, HDL, LDLCALC, TRIG, CHOLHDL, LDLDIRECT in the last 72 hours. Thyroid Function Tests No results for input(s): TSH, T4TOTAL, T3FREE, THYROIDAB in the last 72 hours.  Invalid input(s): FREET3   Other results:   Imaging    DG Chest 2 View  Result Date: 01/27/2022 CLINICAL DATA:  Preoperative examination EXAM: CHEST - 2 VIEW COMPARISON:  01/05/2022 chest radiograph. FINDINGS: Enteric tube enters stomach with the tip not seen on this image. Right PICC terminates over the middle third of the SVC. Stable cardiomediastinal silhouette with mild cardiomegaly. No pneumothorax. Small bilateral pleural effusions,  left greater than right, stable. Patchy consolidation in the upper perihilar lungs bilaterally, right greater than left, largely new. Similar mild-to-moderate bibasilar atelectasis, left greater than right. IMPRESSION: 1. Mild cardiomegaly. New patchy consolidation in the upper perihilar lungs bilaterally, right greater than left, differential includes multilobar pneumonia versus asymmetric pulmonary edema. 2. Stable small bilateral pleural effusions and bibasilar atelectasis, left greater than right. Electronically Signed   By: Ilona Sorrel M.D.   On: 01/25/2022 08:09     Medications:     Scheduled Medications:  buPROPion  300 mg Oral Daily   chlorhexidine  1 application Topical Once   chlorhexidine  15 mL Mouth/Throat Once   Chlorhexidine Gluconate Cloth  6 each Topical Daily   colestipol  1 g Oral BID   ezetimibe  10 mg Oral Daily   feeding supplement  1 Container Oral TID BM   feeding supplement (VITAL 1.5 CAL)  1,000 mL Per Tube Q24H   ferrous sulfate  325 mg Oral Q breakfast   fluticasone  1 spray Each Nare BID   furosemide  80 mg Intravenous Daily   hydroxychloroquine  400 mg Oral QHS   levothyroxine  25 mcg Oral QAC breakfast   loratadine  10 mg Oral Daily   magnesium sulfate  40 mEq Other To OR   montelukast  10 mg Oral Daily   multivitamin with minerals  1 tablet Oral Daily   pantoprazole  40 mg Oral QHS   potassium chloride  80 mEq Other To OR   sodium chloride flush  10-40 mL Intracatheter Q12H   sodium chloride flush  3 mL Intravenous Q12H    Infusions:  sodium chloride     amiodarone 60 mg/hr (01/28/2022 0800)    ceFAZolin (ANCEF) IV     dexmedetomidine     heparin 30,000 units/NS 1000 mL solution for CELLSAVER     heparin 1,300 Units/hr (01/14/2022 0800)   milrinone 0.125 mcg/kg/min (01/31/2022 0800)   norepinephrine (LEVOPHED) Adult infusion      PRN Medications: sodium chloride, acetaminophen, ALPRAZolam, guaiFENesin-dextromethorphan, ipratropium, levalbuterol,  loperamide, melatonin, ondansetron (ZOFRAN) IV, phenol, sodium chloride flush, sodium chloride flush, traZODone    Patient Profile   [name] is a 81 year old with a history of PAF, moderate AS, severe MR, COPD, quit smoking 25 years ago, PAD, HTN, hyperlipidemia, CVA, NICM, tobacco abuse, ischemic bowel with resection in 2017,11/04/22 left ureteral calculus S/P utreteroscopy and stent,  and chronic HFrEF.     Admitted A/C HFrEF and A fib RVR.   Assessment/Plan   A/C HFrEF - Echo this admit EF down to 25% from previous Echo EF 40-45% in 2018 .  BNP on admit > 2800.  - Has LBBB, may eventually benefit from CRT depending on progress -  Cath 2/2 with minimal CAD preserved cardiac output, elevated PCWP (intermittent v-waves up to 50) suggestive of severe MR.  - TEE 2/3 LVEF 15% RV severe HK. Severe MR - on Milrinone 0.125. Co-ox 63% today  - CVP 6-7. Crackles on exam, c/w IV Lasix 80 mg once daily  - GDMT limited by soft BP    2. Severe MR  - 12/28/21 TEE with severe MR - Based on cath and symptoms her MR is playing a significant role in her poor hemodynamics but the question is whether fixing the valve Lauren help her overall trajectory. Given low output and recurrent orthopnea, I think fixing the valve Lauren be important to her trajectory - Plan MitraClip today    3. Persistent AF - Last dose of eliquis 12/24/2021 (morning) -> heparin - s/p DC-CV 2/3 - Back in Afib, V-rates 90s-low 100s  - Continue amio at 60/hr while on milrinone   4. AKI  - Recently had diuretics increased on 1/5. Creatinine on that time was 1.2.  - On admit creatinine was 1.7 - SCr 1.39 today. Follow w/ diuresis    5. A/C Respiratory Failure - On exam has severe underlying COPD - On triple inhalers and recently placed on oxygen 12/17/21.  - Continue nebs. Pulmonary toilet - c/w diuresis per above    6. Anemia  - Hgb 10.4. Iron Sats 7%.  - Received feraheme 02/03  7. Hypokalemia - supp as needed with  diuresis - K 4.0 today  8. Severe protein calorie malnutrition - h/o embolic event to colon in 2017 requiring emergency R hemi-colectomy at [facility] followed by colostomy and then takedown - Prealbumin 11.1 - Cor-trak placed 2/6 for nighttime feeds.   9. Severe deconditioning - PT/OT following   Length of Stay: [address], PA-C  01/10/2022, 9:33 AM  Advanced Heart Failure Team Pager [phone] (M-F; 7a - 5p)  Please contact [facility] for night-coverage after hours (5p -7a ) and weekends on [email or web address]  Patient seen and examined with the above-signed Advanced Practice Provider and/or Housestaff. I personally reviewed laboratory data, imaging studies and relevant notes. I independently examined the patient and formulated the important aspects of the plan. I have edited the note to reflect any of my changes or salient points. I have personally discussed the plan with the patient and/or family.  Remains very weak. Still SOB but improved with lasix and metolazone.   Remains in AF despite IV amio.   General:  Weak appearing. Anxious.  No resp difficulty HEENT: normal + Cortrak Neck: supple. no JVD. Carotids 2+ bilat; no bruits. No lymphadenopathy or thryomegaly appreciated. Cor: PMI nondisplaced. Irregular rate & rhythm. No rubs, gallops or murmurs. Lungs: coarse Abdomen: soft, nontender, nondistended. No hepatosplenomegaly. No bruits or masses. Good bowel sounds. Extremities: no cyanosis, clubbing, rash, edema Neuro: alert & orientedx3, cranial nerves grossly intact. moves all 4 extremities w/o difficulty. Affect pleasant  Volume status improved. Plan for mTEER today. Lauren need repeat DC-CV if does nto convert back to NSR. Continue night TFs.   Glori Bickers, MD  1:03 PM

## 2022-01-13 NOTE — Progress Notes (Signed)
PROGRESS NOTE  [name]  DOB: Aug 19, 1941  PCP: Biagio Borg, MD BJS:[id number]  DOA: 12/26/2021  LOS: 10 days  Hospital Day: 34  Chief Complaint  Patient presents with   Atrial Fibrillation   Brief narrative: [name] is a 81 y.o. female with PMH significant for chronic systolic/diastolic heart failure, nonischemic cardiomyopathy with most recent LVEF 25 to 30%, paroxysmal atrial fibrillation complicated by sick sinus syndrome, COPD, hyperlipidemia, anemia of chronic disease. Patient presented to the ED on 12/14/2021 with progressively worsening shortness of breath for 4 to 5 days, stated with orthopnea, chronic bilateral lower extremity edema despite good compliance to her medications including Lasix.  In the ED, blood pressure was running low. Creatinine elevated to 1.73, BNP elevated to 2800 CBC with WBC count 5.2, hemoglobin 10.8 Urinalysis nonsuggestive of infection EKG with A-fib at the rate of 108, no evidence of ST-T wave changes Chest x-ray bilateral pulmonary edema, bilateral pleural effusion Admit to hospitalist service Cardiology consultation was obtained.  Diuretics was started  2/1, patient went to A-fib with RVR.  Patient was started on amiodarone and heparin drip. 2/2, patient underwent RHC/LHC.  Minimal nonobstructive CAD.  It also showed elevated PCWP suggestive of severe MR 2/4, patient underwent TEE/DC cardioversion.  It showed LVEF of 15%, RV severely hypokinetic.  DC cardioversion was successful to normal sinus rhythm  Subjective: Patient was seen and examined this morning.   Not in distress.  Waiting for MitraClip procedure in the afternoon. She mentions that she continues to have diarrhea.  At home she was taking Imodium twice daily.  Does not seem to be getting it since admission.  I resumed it today.  Assessment/Plan: Acute on chronic systolic/diastolic heart failure -Presented with progressively worsening shortness of breath, orthopnea,  peripheral edema, elevated BNP, and chest x-ray showing bilateral pulm edema and effusion. -Last known EF 25 to 30% from December 2022. -Cardiac consultation obtained.  -on Lasix 80 mg IV daily along with milrinone drip  Severe mitral regurgitation -Underwent MitraClip on Thursday 2/9.  Persistent A-fib -Underwent DC cardioversion on 2/3.  Currently on amiodarone drip.  Cardiology following -Last dose of Eliquis on 12/25/2021.  Currently on heparin drip  AKI on CKD 3B -Baseline creatinine less than 1.3.  Creatinine this admission was elevated to 1.73.  Improving now. Recent Labs    01/05/22 1011 01/26/2022 0130 01/09/2022 0500 01/08/22 0540 01/09/22 0609 01/10/22 0520 01/11/22 0547 01/12/22 0350 01/29/2022 0000 01/15/2022 0500  BUN 25* 21 17 18 15 15 19 16 19 18   CREATININE 1.53* 1.37* 1.38* 1.49* 1.41* 1.34* 1.19* 1.33* 1.34* 1.39*   Hyperlipidemia -History of statin intolerance.  Continue Zetia  COPD Chronic respiratory failure with hypoxia -Continue bronchodilators -Continue 3 L oxygen by nasal cannula  Acquired hypothyroidism -Continue Synthroid  GERD -Continue PPI  S/P colectomy Chronic diarrhea Severe malnutrition -Patient had right hemicolectomy done in 2017 for ischemic colon.  Patient states that since then she has malabsorption and chronic diarrhea with weight loss, she uses Imodium twice daily at home.  I resumed Imodium twice daily today.  She has severe malnutrition.  Dietitian consult appreciated.  Currently she is getting nutrition supplements and nocturnal tube feeds as well.    Anxiety -Patient reported significant anxiety for the procedure planned on 2/9.  As needed Xanax started.  Mobility: PT eval obtained. Goals of care   Code Status: Full Code   Nutritional status:  Body mass index is 20.55 kg/m.  Nutrition Problem: Severe Malnutrition  Etiology: chronic illness (CHF, COPD) Signs/Symptoms: severe fat depletion, severe muscle depletion, percent  weight loss (8.4% weight loss in less than 4 months) Percent weight loss: 8.4 % Diet:  Diet Order             Diet NPO time specified  Diet effective now                   Wounds:  - Incision (Closed) 11/04/21 Vagina Other (Comment) (Active)  Date First Assessed/Time First Assessed: 11/04/21 1122   Location: Vagina  Location Orientation: Other (Comment)    Assessments 11/04/2021 12:32 PM 11/04/2021  1:22 PM  Dressing Type None None     No Linked orders to display     Incision (Closed) 01/05/2022 Wrist Anterior;Right (Active)  Date First Assessed/Time First Assessed: 01/08/2022 1500   Location: Wrist  Location Orientation: Anterior;Right  Present on Admission: No    Assessments 01/25/2022  7:40 PM 01/20/2022  8:00 AM  Dressing Type Transparent dressing;Gauze (Comment) None  Dressing Clean;Dry;Intact --  Dressing Change Frequency Other (Comment) --  Site / Wound Assessment Clean;Dry Clean;Dry  Drainage Amount None None     No Linked orders to display     Pressure Injury 01/09/22 Ischial tuberosity Lower;Left;Right;Medial Stage 1 -  Intact skin with non-blanchable redness of a localized area usually over a bony prominence. stage 1 pressure ulcer (Active)  Date First Assessed/Time First Assessed: 01/09/22 1400   Location: Ischial tuberosity  Location Orientation: Lower;Left;Right;Medial  Staging: Stage 1 -  Intact skin with non-blanchable redness of a localized area usually over a bony prominence.  Woun...    Assessments 01/09/2022  6:10 PM 01/29/2022  8:00 AM  Dressing Type -- Foam - Lift dressing to assess site every shift  Dressing Changed --  Dressing Change Frequency PRN PRN  Site / Wound Assessment Pink;Red;Dry;Clean Clean;Dry  Peri-wound Assessment Intact Intact  Drainage Amount None None     No Linked orders to display    DVT prophylaxis:  SCDs Start: 12/25/2021 2345   Antimicrobials: None Fluid: None Consultants: Cardiology Family Communication: Husband at  bedside  Status is: Inpatient  Continue in-hospital care because: Pending MitraClip on 2/9 Level of care: ICU   Dispo: The patient is from: Home              Anticipated d/c is to: Hopefully home after procedure on Thursday              Patient currently is not medically stable to d/c.   Difficult to place patient No     Infusions:   sodium chloride     amiodarone 60 mg/hr (01/14/2022 1741)   dexmedetomidine (PRECEDEX) IV infusion 0.4 mcg/kg/hr (01/12/2022 1736)   feeding supplement (OSMOLITE 1.2 CAL)     fentaNYL infusion INTRAVENOUS     heparin     milrinone 0.125 mcg/kg/min (01/28/2022 1742)    Scheduled Meds:  arformoterol  15 mcg Nebulization BID   budesonide (PULMICORT) nebulizer solution  0.25 mg Nebulization BID   Chlorhexidine Gluconate Cloth  6 each Topical Daily   colestipol  1 g Per Tube BID   docusate  100 mg Per Tube BID   [START ON 01/14/2022] ezetimibe  10 mg Per Tube Daily   feeding supplement (VITAL 1.5 CAL)  1,000 mL Per Tube Q24H   fentaNYL (SUBLIMAZE) injection  25 mcg Intravenous Once   fluticasone  1 spray Each Nare BID   furosemide  80 mg Intravenous Daily   hydroxychloroquine  400 mg Per Tube QHS   insulin aspart  0-9 Units Subcutaneous Q4H   [START ON 01/14/2022] levothyroxine  25 mcg Per Tube QAC breakfast   loperamide  2 mg Oral BID   [START ON 01/14/2022] loratadine  10 mg Per Tube Daily   [START ON 01/14/2022] montelukast  10 mg Per Tube Daily   [START ON 01/14/2022] multivitamin with minerals  1 tablet Per Tube Daily   pantoprazole (PROTONIX) IV  40 mg Intravenous QHS   polyethylene glycol  17 g Per Tube Daily   revefenacin  175 mcg Nebulization Daily   sodium chloride flush  10-40 mL Intracatheter Q12H   sodium chloride flush  3 mL Intravenous Q12H    PRN meds: sodium chloride, acetaminophen, ALPRAZolam, fentaNYL, guaiFENesin-dextromethorphan, ipratropium, levalbuterol, melatonin, ondansetron (ZOFRAN) IV, phenol, sodium chloride flush, sodium  chloride flush, traZODone   Antimicrobials: Anti-infectives (From admission, onward)    Start     Dose/Rate Route Frequency Ordered Stop   01/15/2022 2200  hydroxychloroquine (PLAQUENIL) tablet 400 mg        400 mg Per Tube Daily at bedtime 01/15/2022 1720     01/20/2022 0400  ceFAZolin (ANCEF) IVPB 2g/100 mL premix        2 g 200 mL/hr over 30 Minutes Intravenous To Surgery 01/12/22 1425 01/05/2022 1442   01/04/22 2200  hydroxychloroquine (PLAQUENIL) tablet 400 mg  Status:  Discontinued        400 mg Oral Daily at bedtime 01/04/22 0224 01/05/2022 1720       Objective: Vitals:   02/01/2022 1551 01/28/2022 1704  BP:    Pulse: 86 100  Resp:  16  Temp:    SpO2:  100%    Intake/Output Summary (Last 24 hours) at 01/16/2022 1743 Last data filed at 01/11/2022 1734 Gross per 24 hour  Intake 1264.72 ml  Output 825 ml  Net 439.72 ml   Filed Weights   01/11/22 0322 01/12/22 0437 01/16/2022 0500  Weight: 54.5 kg 55.1 kg 54.3 kg   Weight change: -0.8 kg Body mass index is 20.55 kg/m.   Physical Exam: General exam: Pleasant, elderly.  Weak, not in pain Skin: No rashes, lesions or ulcers. HEENT: Atraumatic, normocephalic, no obvious bleeding Lungs: Clear to auscultation bilaterally CVS: Tachycardic, irregular rhythm, no murmur  GI/Abd soft, nontender, nondistended, bowel sound present CNS: Alert, awake, oriented x3 Psychiatry: Sad affect Extremities: No pedal edema, no calf tenderness  Data Review: I have personally reviewed the laboratory data and studies available.  F/u labs ordered Unresulted Labs (From admission, onward)     Start     Ordered   01/15/22 0500  Heparin level (unfractionated)  Daily,   R     Question:  Specimen collection method  Answer:  Unit=Unit collect   01/17/2022 1636   01/14/22 0400  Heparin level (unfractionated)  Once-Timed,   TIMED       Question:  Specimen collection method  Answer:  Unit=Unit collect   01/15/2022 1635   01/14/2022 1718  Hemoglobin A1c  Once,   R        Comments: To assess prior glycemic control   Question:  Specimen collection method  Answer:  Unit=Unit collect   01/22/2022 1718   01/29/2022 1714  Draw ABG 1 hour after initiation of ventilator  Once,   R        01/16/2022 1713   01/12/22 2706  Basic metabolic panel  Daily,   R     Comments: While on  milrinone.   Question:  Specimen collection method  Answer:  Lab=Lab collect   01/11/22 0648   01/12/22 0500  Cooxemetry Panel (carboxy, met, total hgb, O2 sat)  Daily,   R     Question:  Specimen collection method  Answer:  Lab=Lab collect   01/11/22 0648   01/11/22 1761  Basic metabolic panel  Daily,   R     Question:  Specimen collection method  Answer:  Lab=Lab collect   01/10/22 1059   01/10/22 0500  Magnesium  Daily,   R     Question:  Specimen collection method  Answer:  Unit=Unit collect   01/09/22 1520   01/08/22 0500  .Cooxemetry Panel (carboxy, met, total hgb, O2 sat)  Daily,   R     Question:  Specimen collection method  Answer:  Unit=Unit collect   01/22/2022 1125   01/10/2022 0500  CBC with Differential/Platelet  Daily,   R      01/05/22 1226   Signed and Held  CBC  Daily,   R     Comments: Post-op day 1   Question:  Specimen collection method  Answer:  Unit=Unit collect   Signed and Held   Signed and Held  Basic metabolic panel  Daily,   R     Comments: Post-op day 1   Question:  Specimen collection method  Answer:  Unit=Unit collect   Signed and Held            Signed, Terrilee Croak, MD [facility] 01/21/2022

## 2022-01-13 NOTE — Transfer of Care (Signed)
Immediate Anesthesia Transfer of Care Note  Patient: [name]  Procedure(s) Performed: MITRAL VALVE REPAIR TRANSESOPHAGEAL ECHOCARDIOGRAM (TEE)  Patient Location: ICU  Anesthesia Type:General  Level of Consciousness: unresponsive and Patient remains intubated per anesthesia plan  Airway & Oxygen Therapy: Patient remains intubated per anesthesia plan and Patient placed on Ventilator (see vital sign flow sheet for setting)  Post-op Assessment: Report given to RN and Post -op Vital signs reviewed and stable  Post vital signs: Reviewed and stable  Last Vitals:  Vitals Value Taken Time  BP    Temp    Pulse 97 01/22/2022 1713  Resp 15 01/25/2022 1713  SpO2 95 % 01/14/2022 1713  Vitals shown include unvalidated device data.  Last Pain:  Vitals:   01/10/2022 1236  TempSrc: Oral  PainSc: 0-No pain         Complications: There were no known notable events for this encounter.

## 2022-01-13 NOTE — Progress Notes (Signed)
°  [facility]   MULTIDISCIPLINARY HEART VALVE TEAM   Heparin to be restarted 4 hours post procedure. Pharm D Heparin consult placed.   Kathyrn Drown NP-C Structural Heart Team  Pager: [phone] Phone: [phone]

## 2022-01-14 ENCOUNTER — Inpatient Hospital Stay (HOSPITAL_COMMUNITY): Payer: Medicare Other

## 2022-01-14 DIAGNOSIS — I5043 Acute on chronic combined systolic (congestive) and diastolic (congestive) heart failure: Secondary | ICD-10-CM | POA: Diagnosis not present

## 2022-01-14 DIAGNOSIS — N179 Acute kidney failure, unspecified: Secondary | ICD-10-CM | POA: Diagnosis not present

## 2022-01-14 DIAGNOSIS — Z66 Do not resuscitate: Secondary | ICD-10-CM | POA: Diagnosis not present

## 2022-01-14 DIAGNOSIS — Z954 Presence of other heart-valve replacement: Secondary | ICD-10-CM

## 2022-01-14 DIAGNOSIS — Z20822 Contact with and (suspected) exposure to covid-19: Secondary | ICD-10-CM | POA: Diagnosis not present

## 2022-01-14 DIAGNOSIS — Z006 Encounter for examination for normal comparison and control in clinical research program: Secondary | ICD-10-CM | POA: Diagnosis not present

## 2022-01-14 DIAGNOSIS — I130 Hypertensive heart and chronic kidney disease with heart failure and stage 1 through stage 4 chronic kidney disease, or unspecified chronic kidney disease: Secondary | ICD-10-CM | POA: Diagnosis not present

## 2022-01-14 LAB — BASIC METABOLIC PANEL
Anion gap: 13 (ref 5–15)
BUN: 17 mg/dL (ref 8–23)
CO2: 31 mmol/L (ref 22–32)
Calcium: 9.1 mg/dL (ref 8.9–10.3)
Chloride: 85 mmol/L — ABNORMAL LOW (ref 98–111)
Creatinine, Ser: 1.49 mg/dL — ABNORMAL HIGH (ref 0.44–1.00)
GFR, Estimated: 35 mL/min — ABNORMAL LOW (ref >60)
Glucose, Bld: 124 mg/dL — ABNORMAL HIGH (ref 70–99)
Potassium: 3.7 mmol/L (ref 3.5–5.1)
Sodium: 129 mmol/L — ABNORMAL LOW (ref 135–145)

## 2022-01-14 LAB — GLUCOSE, CAPILLARY
Glucose-Capillary: 141 mg/dL — ABNORMAL HIGH (ref 70–99)
Glucose-Capillary: 118 mg/dL — ABNORMAL HIGH (ref 70–99)
Glucose-Capillary: 133 mg/dL — ABNORMAL HIGH (ref 70–99)
Glucose-Capillary: 127 mg/dL — ABNORMAL HIGH (ref 70–99)
Glucose-Capillary: 168 mg/dL — ABNORMAL HIGH (ref 70–99)
Glucose-Capillary: 136 mg/dL — ABNORMAL HIGH (ref 70–99)

## 2022-01-14 LAB — ECHOCARDIOGRAM COMPLETE
AR max vel: 1.53 cm2
AV Area VTI: 1.57 cm2
AV Area mean vel: 1.37 cm2
AV Mean grad: 10 mmHg
AV Peak grad: 22.5 mmHg
Ao pk vel: 2.37 m/s
Area-P 1/2: 2.36 cm2
Height: 64 in
MV M vel: 3.84 m/s
MV Peak grad: 58.8 mmHg
MV VTI: 1.34 cm2
S' Lateral: 4.1 cm
Single Plane A4C EF: 28.3 %
Weight: 1897.72 oz

## 2022-01-14 LAB — CBC WITH DIFFERENTIAL/PLATELET
Abs Immature Granulocytes: 0.07 10*3/uL (ref 0.00–0.07)
Basophils Absolute: 0.1 10*3/uL (ref 0.0–0.1)
Basophils Relative: 1 %
Eosinophils Absolute: 0.2 10*3/uL (ref 0.0–0.5)
Eosinophils Relative: 2 %
HCT: 36.4 % (ref 36.0–46.0)
Hemoglobin: 11.7 g/dL — ABNORMAL LOW (ref 12.0–15.0)
Immature Granulocytes: 1 %
Lymphocytes Relative: 13 %
Lymphs Abs: 1.2 10*3/uL (ref 0.7–4.0)
MCH: 26.7 pg (ref 26.0–34.0)
MCHC: 32.1 g/dL (ref 30.0–36.0)
MCV: 82.9 fL (ref 80.0–100.0)
Monocytes Absolute: 1.5 10*3/uL — ABNORMAL HIGH (ref 0.1–1.0)
Monocytes Relative: 17 %
Neutro Abs: 5.9 10*3/uL (ref 1.7–7.7)
Neutrophils Relative %: 66 %
Platelets: 211 10*3/uL (ref 150–400)
RBC: 4.39 MIL/uL (ref 3.87–5.11)
RDW: 18.1 % — ABNORMAL HIGH (ref 11.5–15.5)
WBC: 8.9 10*3/uL (ref 4.0–10.5)
nRBC: 0 % (ref 0.0–0.2)

## 2022-01-14 LAB — MRSA NEXT GEN BY PCR, NASAL: MRSA by PCR Next Gen: NOT DETECTED

## 2022-01-14 LAB — HEPARIN LEVEL (UNFRACTIONATED): Heparin Unfractionated: 0.35 IU/mL (ref 0.30–0.70)

## 2022-01-14 LAB — MAGNESIUM: Magnesium: 1.7 mg/dL (ref 1.7–2.4)

## 2022-01-14 LAB — COOXEMETRY PANEL
Carboxyhemoglobin: 1.3 % (ref 0.5–1.5)
Methemoglobin: 1.1 % (ref 0.0–1.5)
O2 Saturation: 74 %
Total hemoglobin: 10.4 g/dL — ABNORMAL LOW (ref 12.0–16.0)

## 2022-01-14 LAB — HEMOGLOBIN A1C
Hgb A1c MFr Bld: 5.2 % (ref 4.8–5.6)
Mean Plasma Glucose: 102.54 mg/dL

## 2022-01-14 MED ORDER — POTASSIUM CHLORIDE 20 MEQ PO PACK
40.0000 meq | PACK | Freq: Once | ORAL | Status: AC
  Administered 2022-01-14: 40 meq
  Filled 2022-01-14: qty 2

## 2022-01-14 MED ORDER — MAGNESIUM SULFATE 2 GM/50ML IV SOLN
2.0000 g | Freq: Once | INTRAVENOUS | Status: AC
  Administered 2022-01-14: 2 g via INTRAVENOUS
  Filled 2022-01-14: qty 50

## 2022-01-14 MED ORDER — ORAL CARE MOUTH RINSE
15.0000 mL | OROMUCOSAL | Status: DC
  Administered 2022-01-14 (×5): 15 mL via OROMUCOSAL

## 2022-01-14 MED ORDER — CHLORHEXIDINE GLUCONATE 0.12 % MT SOLN
15.0000 mL | Freq: Two times a day (BID) | OROMUCOSAL | Status: DC
  Administered 2022-01-14 – 2022-01-19 (×10): 15 mL via OROMUCOSAL
  Filled 2022-01-14 (×10): qty 15

## 2022-01-14 MED ORDER — VITAL 1.5 CAL PO LIQD
1000.0000 mL | ORAL | Status: DC
  Administered 2022-01-14 – 2022-01-15 (×2): 1000 mL

## 2022-01-14 MED ORDER — ORAL CARE MOUTH RINSE
15.0000 mL | Freq: Two times a day (BID) | OROMUCOSAL | Status: DC
  Administered 2022-01-15 – 2022-01-19 (×7): 15 mL via OROMUCOSAL

## 2022-01-14 MED ORDER — FUROSEMIDE 10 MG/ML IJ SOLN
80.0000 mg | Freq: Once | INTRAMUSCULAR | Status: AC
  Administered 2022-01-14: 80 mg via INTRAVENOUS
  Filled 2022-01-14: qty 8

## 2022-01-14 MED ORDER — CHLORHEXIDINE GLUCONATE 0.12% ORAL RINSE (MEDLINE KIT)
15.0000 mL | Freq: Two times a day (BID) | OROMUCOSAL | Status: DC
  Administered 2022-01-14: 15 mL via OROMUCOSAL

## 2022-01-14 NOTE — Progress Notes (Addendum)
Advanced Heart Failure Rounding Note  PCP-Cardiologist: Will Meredith Leeds, MD   Subjective:    2/1 A fib RVR--> Started on amio drip + heparin drip. PICC placed. Diuretics held.  2/2 RHC/LHC - Had cath with preserved cardiac output, min nonob CAD, elevated PCWP suggestive of severe MR.  2/4 TEE/DC-CV  LVEF 15% RV severely HK. DC-CV to NSR 2/7 Milrinone added for low co-ox (50%). IV diuretics restarted  02/09 s/p Mitraclip A2/P2   S/p Mitraclip A2/P2 yesterday. TEE post procedure with mild to moderate residual MR.  On milrinone 0.125 + 5 NE. Coox 74%  CVP 5  Weaning sedation. Hoping to extubate today.  MAPs low 60s with attempts to wean NE. Likely d/t low diastolic pressure  Remains in Afib, V-rates 60s - 70s, rare PVCs. On amio gtt at 60/hr  Scr 1.39>1.49  Na 129, K 3.7, Mag 1.7  Awake and following commands on vent. Denies pain.   Objective:   Weight Range: 53.8 kg Body mass index is 20.36 kg/m.   Vital Signs:   Temp:  [98 F (36.7 C)-98.7 F (37.1 C)] 98 F (36.7 C) (02/10 0730) Pulse Rate:  [0-116] 65 (02/10 0804) Resp:  [8-29] 16 (02/10 0804) BP: (85-111)/(46-71) 85/60 (02/10 0804) SpO2:  [93 %-100 %] 99 % (02/10 0824) Arterial Line BP: (98-138)/(46-73) 130/62 (02/10 0700) FiO2 (%):  [40 %-70 %] 40 % (02/10 0824) Weight:  [53.8 kg] 53.8 kg (02/10 0500) Last BM Date: 01/12/22  Weight change: Filed Weights   01/12/22 0437 01/22/2022 0500 01/14/22 0500  Weight: 55.1 kg 54.3 kg 53.8 kg    Intake/Output:   Intake/Output Summary (Last 24 hours) at 01/14/2022 0903 Last data filed at 01/14/2022 0757 Gross per 24 hour  Intake 2427.51 ml  Output 3165 ml  Net -737.49 ml      Physical Exam   CVP 5 General:  Intubated. Awake on vent HEENT: + ETT Neck: supple. no JVD. Carotids 2+ bilat; no bruits.  Cor: PMI nondisplaced. Irregular rate and rhythm. No rubs, gallops, 2/6 HSM at apex Lungs: scant crackles anteriorly Abdomen: soft, nontender,  nondistended. No hepatosplenomegaly.  Extremities: no cyanosis, clubbing, rash, edema, + RUE PICC, L radial a line Neuro: Alert. Following commands    Telemetry   AF 60s - 70s, rare PVCs (personally reviewed)   Labs    CBC Recent Labs    01/22/2022 0500 01/10/2022 1448 01/05/2022 2051 01/14/22 0400  WBC 7.1  --   --  8.9  NEUTROABS 4.7  --   --  5.9  HGB 10.4*   < > 12.2 11.7*  HCT 33.2*   < > 36.0 36.4  MCV 84.9  --   --  82.9  PLT 176  --   --  211   < > = values in this interval not displayed.   Basic Metabolic Panel Recent Labs    01/12/22 1702 01/08/2022 0000 01/17/2022 0500 01/31/2022 1448 01/05/2022 2051 01/14/22 0400  NA  --    < > 129*   < > 128* 129*  K  --    < > 4.0   < > 3.7 3.7  CL  --    < > 85*  --   --  85*  CO2  --    < > 31  --   --  31  GLUCOSE  --    < > 275*  --   --  124*  BUN  --    < >  18  --   --  17  CREATININE  --    < > 1.39*  --   --  1.49*  CALCIUM  --    < > 8.7*  --   --  9.1  MG 2.0  --  1.9  --   --  1.7  PHOS 3.6  --  4.0  --   --   --    < > = values in this interval not displayed.   Liver Function Tests No results for input(s): AST, ALT, ALKPHOS, BILITOT, PROT, ALBUMIN in the last 72 hours.  No results for input(s): LIPASE, AMYLASE in the last 72 hours. Cardiac Enzymes No results for input(s): CKTOTAL, CKMB, CKMBINDEX, TROPONINI in the last 72 hours.  BNP: BNP (last 3 results) Recent Labs    12/24/2021 1737  BNP 2,812.3*    ProBNP (last 3 results) No results for input(s): PROBNP in the last 8760 hours.   D-Dimer No results for input(s): DDIMER in the last 72 hours. Hemoglobin A1C Recent Labs    01/14/22 0400  HGBA1C 5.2   Fasting Lipid Panel No results for input(s): CHOL, HDL, LDLCALC, TRIG, CHOLHDL, LDLDIRECT in the last 72 hours. Thyroid Function Tests No results for input(s): TSH, T4TOTAL, T3FREE, THYROIDAB in the last 72 hours.  Invalid input(s): FREET3   Other results:   Imaging    DG Chest Port 1  View  Result Date: 01/23/2022 CLINICAL DATA:  Endotracheal intubation. EXAM: PORTABLE CHEST 1 VIEW COMPARISON:  Chest radiograph dated January 13, 2022. FINDINGS: Interval placement of the endotracheal tube with distal tip approximately 3.5 cm above the carina. Interval removal of the feeding tube. Right PICC with distal tip in the SVC, unchanged. Stable cardiomegaly. Atherosclerotic calcification of the aortic arch. Bilateral patchy lung opacities predominantly in the right upper lobe concerning for pneumonia. Small bilateral pleural effusions, left greater than the right with bibasilar atelectasis. IMPRESSION: 1. Interval placement of endotracheal tube with distal tip approximately 3.5 cm above the carina. 2. Bilateral patchy lung opacities, concerning for pneumonia with small bilateral pleural effusions, not significantly changed. Electronically Signed   By: Keane Police D.O.   On: 01/25/2022 17:37   DG Abd Portable 1V  Result Date: 01/12/2022 CLINICAL DATA:  Orogastric tube placement EXAM: PORTABLE ABDOMEN - 1 VIEW COMPARISON:  None. FINDINGS: Orogastric tube tip noted within the expected distal body of the stomach. Small right and moderate left pleural effusions are present. Abdominal gas pattern is nonspecific due to a paucity of intra-abdominal gas. Lumbosacral fusion with instrumentation noted. IMPRESSION: Nasogastric tube tip within the distal body of the stomach. Electronically Signed   By: Fidela Salisbury M.D.   On: 01/24/2022 22:44   ECHO TEE  Result Date: 01/24/2022    TRANSESOPHOGEAL ECHO REPORT   Patient Name:   [name] Date of Exam: 01/30/2022 Medical Rec #:  [id number]      Height:       64.0 in Accession #:    [id number]     Weight:       119.7 lb Date of Birth:  November 19, 1941      BSA:          1.573 m Patient Age:    81 years       BP:           93/56 mmHg Patient Gender: F              HR:  72 bpm. Exam Location:  Inpatient Procedure: Transesophageal Echo, 3D Echo, Color Doppler  and Cardiac Doppler Indications:     Mitral regurgitation  History:         Patient has prior history of Echocardiogram examinations, most                  recent 01/05/2022. COPD, Stroke and TIA, Arrythmias:Atrial                  Fibrillation; Risk Factors:Hypertension, Dyslipidemia and                  Former Smoker. Chronic kidney disease. NICM (nonischemic                  cardiomyopathy).                   Mitral Valve: NTW Mitra-Clip valve is present in the mitral                  position. Procedure Date: 01/18/2022.  Sonographer:     Darlina Sicilian RDCS Referring Phys:  [id number] Woodfin Ganja THOMPSON Diagnosing Phys: Eleonore Chiquito MD PROCEDURE: After discussion of the risks and benefits of a TEE, an informed consent was obtained from the patient. The patient was intubated. The transesophogeal probe was passed without difficulty through the esophogus of the patient. Imaged were obtained with the patient in a supine position. Sedation performed by different physician. Image quality was excellent. The patient's vital signs; including heart rate, blood pressure, and oxygen saturation; remained stable throughout the procedure. The patient developed no complications during the procedure. IMPRESSIONS  1. TEE guided mitraclip procedure. Severe mitral regurgitgation was present due to degenerative mitral valve disease. The leaflets were thickened and retracted with central severe MR (VCA 0.49 cm2). There was systolic reversal of flow in all pulmonary veins. Direct visualization of the IAS was performed. A NTW mitraclip was placed in the A2-P2 position. MR was reduced to mild to moderate. MG 4 mmHG @ 80 bpm. Pulmonary flow returned to systolic dominant. 3D VCA reduced to 0.27 cm2. MVA after the procedure (combined orifices) 2.40 cm2. LVOT VTI improved from ~11 cm to 17 cm. A small iatrogenic PFO remained with L to R shunting. The mitral valve has been repaired/replaced. Severe mitral valve regurgitation. No evidence of  mitral stenosis. There is  a NTW Mitra-Clip present in the mitral position. Procedure Date: 01/26/2022.  2. Left ventricular ejection fraction, by estimation, is <20%. The left ventricle has severely decreased function. The left ventricular internal cavity size was mildly to moderately dilated.  3. Right ventricular systolic function is moderately reduced. The right ventricular size is moderately enlarged.  4. Left atrial size was severely dilated. No left atrial/left atrial appendage thrombus was detected.  5. Right atrial size was severely dilated.  6. The aortic valve is tricuspid. Aortic valve regurgitation is not visualized. Aortic valve sclerosis/calcification is present, without any evidence of aortic stenosis.  7. There is Moderate (Grade III) layered plaque involving the descending aorta. FINDINGS  Left Ventricle: Left ventricular ejection fraction, by estimation, is <20%. The left ventricle has severely decreased function. The left ventricular internal cavity size was mildly to moderately dilated. Right Ventricle: The right ventricular size is moderately enlarged. No increase in right ventricular wall thickness. Right ventricular systolic function is moderately reduced. Left Atrium: Left atrial size was severely dilated. No left atrial/left atrial appendage thrombus was detected. Right Atrium: Right atrial size was severely  dilated. Pericardium: There is no evidence of pericardial effusion. Mitral Valve: TEE guided mitraclip procedure. Severe mitral regurgitgation was present due to degenerative mitral valve disease. The leaflets were thickened and retracted with central severe MR (VCA 0.49 cm2). There was systolic reversal of flow in all pulmonary veins. Direct visualization of the IAS was performed. A NTW mitraclip was placed in the A2-P2 position. MR was reduced to mild to moderate. MG 4 mmHG @ 80 bpm. Pulmonary flow returned to systolic dominant. 3D VCA reduced to 0.27 cm2. MVA after the procedure  (combined orifices) 2.40 cm2. LVOT VTI improved from ~11 cm to 17 cm. A small iatrogenic PFO remained with L to R shunting. The mitral valve has been repaired/replaced. Severe mitral valve regurgitation. There is a NTW Mitra-Clip present in  the mitral position. Procedure Date: 01/16/2022. No evidence of mitral valve stenosis. Tricuspid Valve: The tricuspid valve is grossly normal. Tricuspid valve regurgitation is mild . No evidence of tricuspid stenosis. Aortic Valve: The aortic valve is tricuspid. Aortic valve regurgitation is not visualized. Aortic valve sclerosis/calcification is present, without any evidence of aortic stenosis. Pulmonic Valve: The pulmonic valve was grossly normal. Pulmonic valve regurgitation is not visualized. No evidence of pulmonic stenosis. Aorta: The aortic root and ascending aorta are structurally normal, with no evidence of dilitation. There is moderate (Grade III) layered plaque involving the descending aorta. Venous: A pattern of systolic flow reversal, suggestive of severe mitral regurgitation is recorded from the left upper pulmonary vein, the left lower pulmonary vein, the right upper pulmonary vein and the right lower pulmonary vein. IAS/Shunts: The atrial septum is grossly normal.  AORTIC VALVE LVOT Vmax:   81.35 cm/s LVOT Vmean:  57.800 cm/s LVOT VTI:    0.142 m MR Peak grad:    70.6 mmHg MR Mean grad:    38.0 mmHg    SHUNTS MR Vmax:         420.00 cm/s  Systemic VTI: 0.14 m MR Vmean:        285.0 cm/s MR PISA:         3.08 cm MR PISA Eff ROA: 28 mm MR PISA Radius:  0.70 cm Eleonore Chiquito MD Electronically signed by Eleonore Chiquito MD Signature Date/Time: 01/29/2022/6:09:25 PM    Final      Medications:     Scheduled Medications:  arformoterol  15 mcg Nebulization BID   budesonide (PULMICORT) nebulizer solution  0.25 mg Nebulization BID   chlorhexidine gluconate (MEDLINE KIT)  15 mL Mouth Rinse BID   Chlorhexidine Gluconate Cloth  6 each Topical Daily   colestipol  1 g  Per Tube BID   docusate  100 mg Per Tube BID   ezetimibe  10 mg Per Tube Daily   feeding supplement (VITAL 1.5 CAL)  1,000 mL Per Tube Q24H   fentaNYL (SUBLIMAZE) injection  25 mcg Intravenous Once   fluticasone  1 spray Each Nare BID   furosemide  80 mg Intravenous Daily   hydroxychloroquine  400 mg Per Tube QHS   insulin aspart  0-9 Units Subcutaneous Q4H   levothyroxine  25 mcg Per Tube QAC breakfast   loperamide  2 mg Oral BID   loratadine  10 mg Per Tube Daily   mouth rinse  15 mL Mouth Rinse 10 times per day   montelukast  10 mg Per Tube Daily   multivitamin with minerals  1 tablet Per Tube Daily   pantoprazole (PROTONIX) IV  40 mg Intravenous QHS   polyethylene  glycol  17 g Per Tube Daily   revefenacin  175 mcg Nebulization Daily   sodium chloride flush  10-40 mL Intracatheter Q12H   sodium chloride flush  3 mL Intravenous Q12H   sodium chloride flush  3 mL Intravenous Q12H    Infusions:  sodium chloride 250 mL (01/14/22 0518)   sodium chloride     amiodarone 60 mg/hr (01/14/22 0700)   dexmedetomidine (PRECEDEX) IV infusion 1 mcg/kg/hr (01/14/22 0700)   fentaNYL infusion INTRAVENOUS 100 mcg/hr (01/14/22 0700)   heparin 1,300 Units/hr (01/14/22 0700)   milrinone 0.125 mcg/kg/min (01/14/22 0700)   norepinephrine (LEVOPHED) Adult infusion 5 mcg/min (01/14/22 0700)    PRN Medications: sodium chloride, sodium chloride, acetaminophen, ALPRAZolam, fentaNYL, guaiFENesin-dextromethorphan, hydrALAZINE, ipratropium, labetalol, levalbuterol, melatonin, ondansetron (ZOFRAN) IV, phenol, sodium chloride flush, sodium chloride flush, sodium chloride flush, traZODone    Patient Profile   [name] is a 81 year old with a history of PAF, moderate AS, severe MR, COPD, quit smoking 25 years ago, PAD, HTN, hyperlipidemia, CVA, NICM, tobacco abuse, ischemic bowel with resection in 2017,11/04/22 left ureteral calculus S/P utreteroscopy and stent,  and chronic HFrEF.     Admitted A/C HFrEF  and A fib RVR.   Assessment/Plan   A/C HFrEF - Echo this admit EF down to 25% from previous Echo EF 40-45% in 2018 .  BNP on admit > 2800.  - Has LBBB, may eventually benefit from CRT depending on progress - Cath 2/2 with minimal CAD preserved cardiac output, elevated PCWP (intermittent v-waves up to 50) suggestive of severe MR.  - TEE 2/3 LVEF 15% RV severe HK. Severe MR - S/p mitral valve repair with mitraclip 02/09 - on Milrinone 0.125 + 5 NE. Co-ox 74%. Wean NE as able. MAPs appear lower d/t low diastolic BP - CVP 5. Crackles on exam, c/w IV Lasix 80 mg once daily  - GDMT limited by soft BP    2. Severe MR  - 12/28/21 TEE with severe MR - s/p mitraclip A2/P2 02/09 - TEE post procedure with mild to moderate residual MR - Awaiting echo today   3. Persistent AF - Last dose of eliquis 01/02/2022 (morning) -> heparin - s/p DC-CV 2/3 - Back in Afib, V-rates 60s-70s - Continue amio at 60/hr while on milrinone   4. AKI  - Recently had diuretics increased on 1/5. Creatinine on that time was 1.2.  - On admit creatinine was 1.7 - SCr 1.49 today. Follow w/ diuresis    5. A/C Respiratory Failure - On exam has severe underlying COPD - On triple inhalers and recently placed on oxygen 12/17/21.  - Attempting to wean off vent today - ? Need for abx with bilateral lung opacities on CXR. Will defer to CCM. AF. No leukocytosis.   6. Anemia  - Hgb 11.7. Iron Sats 7%.  - Received feraheme 02/03  7. Hypokalemia/hypomagnesemia - supp as needed with diuresis - K 3.7 and mag 1.7 today  8. 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 Planning tube feeds again after extubation  9. Severe deconditioning - PT/OT following   Length of Stay: Matthews, LINDSAY N, PA-C  01/14/2022, 9:03 AM  Advanced Heart Failure  Team Pager [phone] (M-F; 7a - 5p)  Please contact [facility] for night-coverage after hours (5p -7a ) and weekends on [email or web address]  Agree with above.  S/p mTEER yesterday with good result. Extubated this am. Co-ox much improved. Remains in AF.   Her dyspnea is improve. Says she can finally take a deep breath. Orthopnea improved.   Remains weak. Having some nausea  General:  Weak appearing. No resp difficulty HEENT: normal + cor trak Neck: supple. no JVD. Carotids 2+ bilat; no bruits. No lymphadenopathy or thryomegaly appreciated. Cor: PMI nondisplaced. Irregular rate & rhythm. No rubs, gallops or murmurs. Lungs: mild crackles Abdomen: soft, nontender, nondistended. No hepatosplenomegaly. No bruits or masses. Good bowel sounds. Extremities: no cyanosis, clubbing, rash, tr-1+ edema Neuro: alert & orientedx3, cranial nerves grossly intact. moves all 4 extremities w/o difficulty. Affect pleasant  S/p successful mTEER. Co-ox and respiratory status improved. Continue heparin and IV amio. Will need repeat DC-CV next week. Continue aggressive supportive care with TFs and mobilization.   CRITICAL CARE Performed by: Glori Bickers  Total critical care time: 35 minutes  Critical care time was exclusive of separately billable procedures and treating other patients.  Critical care was necessary to treat or prevent imminent or life-threatening deterioration.  Critical care was time spent personally by me (independent of midlevel providers or residents) on the following activities: development of treatment plan with patient and/or surrogate as well as nursing, discussions with consultants, evaluation of patient's response to treatment, examination of patient, obtaining history from patient or surrogate, ordering and performing treatments and interventions, ordering and review of laboratory studies, ordering and review of radiographic studies, pulse oximetry and re-evaluation of patient's  condition.  Glori Bickers, MD  8:06 PM

## 2022-01-14 NOTE — Progress Notes (Signed)
K+ 3.7, Mg 1.7 Replaced per protocol

## 2022-01-14 NOTE — Plan of Care (Signed)

## 2022-01-14 NOTE — Progress Notes (Signed)
Structural HD Progress Note  Patient Name: [name] Date of Encounter: 01/14/2022  [facility] Cardiologist: Will Meredith Leeds, MD   Subjective   Remains intubated/sedated/amio/levo (low dose)/precedex/milrinone/heparin; afebrile, Co-ox 70+, I/O -623, on fio2 40%, Cr up to 1.5; afebrile.   S/p technically successful mitral edge to edge repair with one NTW clip 4+ MR with pulm vein flow reversal >> 1+ MR with normal pulm vein flow.   Inpatient Medications    Scheduled Meds:  arformoterol  15 mcg Nebulization BID   budesonide (PULMICORT) nebulizer solution  0.25 mg Nebulization BID   chlorhexidine gluconate (MEDLINE KIT)  15 mL Mouth Rinse BID   Chlorhexidine Gluconate Cloth  6 each Topical Daily   colestipol  1 g Per Tube BID   docusate  100 mg Per Tube BID   ezetimibe  10 mg Per Tube Daily   feeding supplement (VITAL 1.5 CAL)  1,000 mL Per Tube Q24H   fentaNYL (SUBLIMAZE) injection  25 mcg Intravenous Once   fluticasone  1 spray Each Nare BID   furosemide  80 mg Intravenous Daily   hydroxychloroquine  400 mg Per Tube QHS   insulin aspart  0-9 Units Subcutaneous Q4H   levothyroxine  25 mcg Per Tube QAC breakfast   loperamide  2 mg Oral BID   loratadine  10 mg Per Tube Daily   mouth rinse  15 mL Mouth Rinse 10 times per day   montelukast  10 mg Per Tube Daily   multivitamin with minerals  1 tablet Per Tube Daily   pantoprazole (PROTONIX) IV  40 mg Intravenous QHS   polyethylene glycol  17 g Per Tube Daily   revefenacin  175 mcg Nebulization Daily   sodium chloride flush  10-40 mL Intracatheter Q12H   sodium chloride flush  3 mL Intravenous Q12H   sodium chloride flush  3 mL Intravenous Q12H   Continuous Infusions:  sodium chloride 250 mL (01/14/22 0518)   sodium chloride     amiodarone 60 mg/hr (01/14/22 0600)   dexmedetomidine (PRECEDEX) IV infusion 1.2 mcg/kg/hr (01/14/22 0600)   feeding supplement (OSMOLITE 1.2 CAL)     fentaNYL infusion INTRAVENOUS 100  mcg/hr (01/14/22 0600)   heparin 1,300 Units/hr (01/14/22 0600)   milrinone 0.125 mcg/kg/min (01/14/22 0600)   norepinephrine (LEVOPHED) Adult infusion 5 mcg/min (01/14/22 0600)   PRN Meds: sodium chloride, sodium chloride, acetaminophen, ALPRAZolam, fentaNYL, guaiFENesin-dextromethorphan, hydrALAZINE, ipratropium, labetalol, levalbuterol, melatonin, ondansetron (ZOFRAN) IV, phenol, sodium chloride flush, sodium chloride flush, sodium chloride flush, traZODone   Vital Signs    Vitals:   01/14/22 0545 01/14/22 0600 01/14/22 0615 01/14/22 0630  BP:  96/67    Pulse: 71 68 69 68  Resp: 16 16 16 16   Temp:      TempSrc:      SpO2: 98% 99% 99% 99%  Weight:      Height:        Intake/Output Summary (Last 24 hours) at 01/14/2022 0641 Last data filed at 01/14/2022 0600 Gross per 24 hour  Intake 2366.93 ml  Output 2990 ml  Net -623.07 ml   Last 3 Weights 01/14/2022 01/05/2022 01/12/2022  Weight (lbs) 118 lb 9.7 oz 119 lb 11.4 oz 121 lb 7.6 oz  Weight (kg) 53.8 kg 54.3 kg 55.1 kg      Telemetry    AF, rate controlled- Personally Reviewed  ECG    N/A - Personally Reviewed  Physical Exam   GEN: Intubated, sedated  Neck: Intubated Cardiac:  Irregular, no murmurs Respiratory: Coarse anteriorly GI: Soft, nontender, non-distended  MS: No edema; No deformity.  Warm, well perfused; R groin CDI Neuro:  Nonfocal  Psych: Normal affect   Labs    High Sensitivity Troponin:   Recent Labs  Lab 01/04/2022 1913 12/15/2021 2113  TROPONINIHS 13 13     Chemistry Recent Labs  Lab 01/12/22 1702 01/24/2022 0000 01/16/2022 0500 01/29/2022 1448 01/24/2022 2051 01/14/22 0400  NA  --  129* 129* 127* 128* 129*  K  --  4.0 4.0 4.2 3.7 3.7  CL  --  87* 85*  --   --  85*  CO2  --  31 31  --   --  31  GLUCOSE  --  253* 275*  --   --  124*  BUN  --  19 18  --   --  17  CREATININE  --  1.34* 1.39*  --   --  1.49*  CALCIUM  --  8.6* 8.7*  --   --  9.1  MG 2.0  --  1.9  --   --  1.7  GFRNONAA  --  40*  38*  --   --  35*  ANIONGAP  --  11 13  --   --  13    Lipids No results for input(s): CHOL, TRIG, HDL, LABVLDL, LDLCALC, CHOLHDL in the last 168 hours.  Hematology Recent Labs  Lab 01/25/2022 0000 01/09/2022 0500 01/18/2022 1448 01/05/2022 2051 01/14/22 0400  WBC 7.1 7.1  --   --  8.9  RBC 3.91 3.91  --   --  4.39  HGB 10.4* 10.4* 10.9* 12.2 11.7*  HCT 33.4* 33.2* 32.0* 36.0 36.4  MCV 85.4 84.9  --   --  82.9  MCH 26.6 26.6  --   --  26.7  MCHC 31.1 31.3  --   --  32.1  RDW 18.3* 18.3*  --   --  18.1*  PLT 175 176  --   --  211   Thyroid No results for input(s): TSH, FREET4 in the last 168 hours.  BNPNo results for input(s): BNP, PROBNP in the last 168 hours.  DDimer No results for input(s): DDIMER in the last 168 hours.   Radiology    DG Chest 2 View  Result Date: 02/01/2022 CLINICAL DATA:  Preoperative examination EXAM: CHEST - 2 VIEW COMPARISON:  01/05/2022 chest radiograph. FINDINGS: Enteric tube enters stomach with the tip not seen on this image. Right PICC terminates over the middle third of the SVC. Stable cardiomediastinal silhouette with mild cardiomegaly. No pneumothorax. Small bilateral pleural effusions, left greater than right, stable. Patchy consolidation in the upper perihilar lungs bilaterally, right greater than left, largely new. Similar mild-to-moderate bibasilar atelectasis, left greater than right. IMPRESSION: 1. Mild cardiomegaly. New patchy consolidation in the upper perihilar lungs bilaterally, right greater than left, differential includes multilobar pneumonia versus asymmetric pulmonary edema. 2. Stable small bilateral pleural effusions and bibasilar atelectasis, left greater than right. Electronically Signed   By: Ilona Sorrel M.D.   On: 01/05/2022 08:09   DG Chest Port 1 View  Result Date: 01/12/2022 CLINICAL DATA:  Endotracheal intubation. EXAM: PORTABLE CHEST 1 VIEW COMPARISON:  Chest radiograph dated January 13, 2022. FINDINGS: Interval placement of the  endotracheal tube with distal tip approximately 3.5 cm above the carina. Interval removal of the feeding tube. Right PICC with distal tip in the SVC, unchanged. Stable cardiomegaly. Atherosclerotic calcification of the aortic arch. Bilateral patchy lung  opacities predominantly in the right upper lobe concerning for pneumonia. Small bilateral pleural effusions, left greater than the right with bibasilar atelectasis. IMPRESSION: 1. Interval placement of endotracheal tube with distal tip approximately 3.5 cm above the carina. 2. Bilateral patchy lung opacities, concerning for pneumonia with small bilateral pleural effusions, not significantly changed. Electronically Signed   By: Keane Police D.O.   On: 01/21/2022 17:37   DG Abd Portable 1V  Result Date: 01/26/2022 CLINICAL DATA:  Orogastric tube placement EXAM: PORTABLE ABDOMEN - 1 VIEW COMPARISON:  None. FINDINGS: Orogastric tube tip noted within the expected distal body of the stomach. Small right and moderate left pleural effusions are present. Abdominal gas pattern is nonspecific due to a paucity of intra-abdominal gas. Lumbosacral fusion with instrumentation noted. IMPRESSION: Nasogastric tube tip within the distal body of the stomach. Electronically Signed   By: Fidela Salisbury M.D.   On: 01/11/2022 22:44    Cardiac Studies   TEE 2/9  NTW clip A2-P2 now mild to moderate MR, MG 4, MVA 2.4cm2  Patient Profile     Ms Pol is a 81 year old with a history of PAF, moderate AS, severe MR, COPD, quit smoking 25 years ago, PAD, HTN, hyperlipidemia, CVA, NICM, tobacco abuse, ischemic bowel with resection in 2017,11/04/22 left ureteral calculus S/P utreteroscopy and stent,  and chronic HFrEF.    Assessment & Plan    Acute on chronic systolic failure: Likely due to MR + AF.  Net negative overnight.   AF: Continue heparin and amiodarone drips, now more relatively rate controlled, will see what transpires after extubation AKI: Cr up to 1.5 with good UOP  overnight.  Diuretics per HF team Severe MR: Technically successful result, TTE today.  Not need for plavix or ASA, cont hep gtt and will need A/C when able to take PO Malnutrition:  Cont aggressive nutritional support Respiratory failure: On minimal vent settings, hopefully PSV and extubate today.     For questions or updates, please contact [place] Please consult [email or web address] for contact info under        Signed, Early Osmond, MD  01/14/2022, 6:41 AM

## 2022-01-14 NOTE — Progress Notes (Signed)
Occupational Therapy Treatment Patient Details Name: [name] MRN: [id number] DOB: 1941/07/15 Today's Date: 01/14/2022   History of present illness Patient is a 81 y/o female who presents on 12/10/2021 with weakness, fluttering in chest and SOB. CXR- bil pulmonary edema. Found to have acute on chronic CHF, A-fib with RVR and severe mitral regurgitation.s/p cardioversion 2/3. Pt underwent Mitraclip on 2/9, remained intubated after procedure until extubation on 2/10. PMH includes PAF, moderate AS, COPD, quit smoking 25 years ago, PAD, HTN, CVA, NICM, tobacco abuse, ischemic bowel with resection in 2017,11/04/22 left ureteral calculus S/P utreteroscopy and stent and chronic HFrEF.   OT comments  Pt seen for first OT session since transfer to ICU and s/p extubation. Pt able to tolerate standing at bedside and sidesteps with Min A but deferred transfer OOB d/t nausea onset. BP did drop with activity, but stable and negative for orthostatics. Anticipate pt will continue to improve to go home with family support. Based on current presentation, updating DC recs from no OT follow-up to [facility]    Recommendations for follow up therapy are one component of a multi-disciplinary discharge planning process, led by the attending physician.  Recommendations may be updated based on patient status, additional functional criteria and insurance authorization.    Follow Up Recommendations  Home health OT    Assistance Recommended at Discharge Set up Supervision/Assistance  Patient can return home with the following  A little help with bathing/dressing/bathroom;Assistance with cooking/housework   Equipment Recommendations  None recommended by OT    Recommendations for Other Services      Precautions / Restrictions Precautions Precautions: Fall;Other (comment) Precaution Comments: watch O2, BP, cortrak Restrictions Weight Bearing Restrictions: No       Mobility Bed Mobility Overal bed mobility: Needs  Assistance Bed Mobility: Supine to Sit, Sit to Supine     Supine to sit: Min guard Sit to supine: Min assist   General bed mobility comments: min guard for safety to get EOB, Min A to get B LE back into bed    Transfers Overall transfer level: Needs assistance Equipment used: 2 person hand held assist Transfers: Sit to/from Stand Sit to Stand: Min assist           General transfer comment: Min A to stand via handheld assist and side steps along bedside, posterior bias     Balance Overall balance assessment: Needs assistance Sitting-balance support: Feet supported Sitting balance-[name]: Fair     Standing balance support: Bilateral upper extremity supported Standing balance-[name]: Poor Standing balance comment: reliant on UE support in standing                           ADL either performed or assessed with clinical judgement   ADL Overall ADL's : Needs assistance/impaired                     Lower Body Dressing: Moderate assistance;Sit to/from stand Lower Body Dressing Details (indicate cue type and reason): assist to don socks due to height of ICU bed, likely able to complete with less assist               General ADL Comments: Focus on EOB/OOB attempts s/p extubation    Extremity/Trunk Assessment Upper Extremity Assessment Upper Extremity Assessment: Generalized weakness   Lower Extremity Assessment Lower Extremity Assessment: Defer to PT evaluation        Vision   Vision Assessment?: No apparent  visual deficits   Perception     Praxis      Cognition Arousal/Alertness: Awake/alert Behavior During Therapy: WFL for tasks assessed/performed Overall Cognitive Status: Within Functional Limits for tasks assessed                                          Exercises      Shoulder Instructions       General Comments Son, Sonia Side present and supportive. Pt working on IS exercises on entry. BP supine:  103/68 (73), BP sitting EOB: 89/61 (71) (stable sitting EOB), BP after standing: 79/69 (74), 105/70 (80) after returning to bed. SpO2 WFL    Pertinent Vitals/ Pain       Pain Assessment Pain Assessment: Faces Faces Pain Scale: Hurts little more Pain Location: chronic low back pain Pain Descriptors / Indicators: Grimacing, Guarding, Sore Pain Intervention(s): Monitored during session, Limited activity within patient's tolerance  Home Living                                          Prior Functioning/Environment              Frequency  Min 3X/week        Progress Toward Goals  OT Goals(current goals can now be found in the care plan section)  Progress towards OT goals: Progressing toward goals  Acute Rehab OT Goals Patient Stated Goal: go home soon OT Goal Formulation: With patient Time For Goal Achievement: 01/24/22 Potential to Achieve Goals: Good ADL Goals Pt Will Perform Grooming: with modified independence;standing Pt Will Perform Upper Body Dressing: with modified independence Pt Will Perform Lower Body Dressing: with modified independence;sit to/from stand Pt Will Transfer to Toilet: with modified independence;ambulating Pt Will Perform Toileting - Clothing Manipulation and hygiene: with modified independence;sit to/from stand Pt/caregiver will Perform Home Exercise Program: Increased strength;Both right and left upper extremity;Independently Additional ADL Goal #1: Patient will tolerate therapeutic activity for 8-10 minutes without need for rest break or significant change in vitals.  Plan Discharge plan needs to be updated    Co-evaluation    PT/OT/SLP Co-Evaluation/Treatment: Yes Reason for Co-Treatment: Complexity of the patient's impairments (multi-system involvement);Other (comment) (first session since extubation)   OT goals addressed during session: ADL's and self-care;Strengthening/ROM      AM-PAC OT "6 Clicks" Daily Activity      Outcome Measure   Help from another person eating meals?: None Help from another person taking care of personal grooming?: A Little Help from another person toileting, which includes using toliet, bedpan, or urinal?: A Little Help from another person bathing (including washing, rinsing, drying)?: A Little Help from another person to put on and taking off regular upper body clothing?: A Little Help from another person to put on and taking off regular lower body clothing?: A Lot 6 Click Score: 18    End of Session Equipment Utilized During Treatment: Oxygen  OT Visit Diagnosis: Unsteadiness on feet (R26.81);Muscle weakness (generalized) (M62.81)   Activity Tolerance Patient tolerated treatment well   Patient Left in bed;with call bell/phone within reach;with bed alarm set;with family/visitor present   Nurse Communication Mobility status        Time: 1430-1453 OT Time Calculation (min): 23 min  Charges: OT General Charges $OT Visit: 1 Visit OT Treatments $  Therapeutic Activity: 8-22 mins  Malachy Chamber, OTR/L Acute Rehab Services Office: [phone]   Layla Maw 01/14/2022, 3:12 PM

## 2022-01-14 NOTE — Progress Notes (Signed)
Physical Therapy Treatment Patient Details Name: [name] MRN: [id number] DOB: 09/15/1941 Today's Date: 01/14/2022   History of Present Illness Patient is a 81 y/o female who presents on 12/20/2021 with weakness, fluttering in chest and SOB. CXR- bil pulmonary edema. Found to have acute on chronic CHF, A-fib with RVR and severe mitral regurgitation.s/p cardioversion 2/3. Pt underwent Mitraclip on 2/9, remained intubated after procedure until extubation on 2/10. PMH includes PAF, moderate AS, COPD, quit smoking 25 years ago, PAD, HTN, CVA, NICM, tobacco abuse, ischemic bowel with resection in 2017,11/04/22 left ureteral calculus S/P utreteroscopy and stent and chronic HFrEF.    PT Comments    Pt extubated today after mitraclip yesterday. Pt was able to perform all functional mobility at a min guard-minA level. Mobility limited by low BP today, see General Comments below. She does continue to display balance deficits with a posterior bias today, placing her at risk for falls. She benefits from bil UE support in standing for stability. Focused session on trying to strengthen legs through performing LAQ or weight bearing in standing and on strengthening her trunk with sitting EOB. Will continue to follow acutely. Current recommendations remain appropriate.   Recommendations for follow up therapy are one component of a multi-disciplinary discharge planning process, led by the attending physician.  Recommendations may be updated based on patient status, additional functional criteria and insurance authorization.  Follow Up Recommendations  Home health PT     Assistance Recommended at Discharge Frequent or constant Supervision/Assistance  Patient can return home with the following A little help with walking and/or transfers;A little help with bathing/dressing/bathroom;Help with stairs or ramp for entrance;Assist for transportation;Assistance with cooking/housework   Equipment Recommendations   Rolling walker (2 wheels);BSC/3in1    Recommendations for Other Services       Precautions / Restrictions Precautions Precautions: Fall;Other (comment) Precaution Comments: watch O2, BP, cortrak Restrictions Weight Bearing Restrictions: No     Mobility  Bed Mobility Overal bed mobility: Needs Assistance Bed Mobility: Supine to Sit, Sit to Supine     Supine to sit: Min guard, HOB elevated Sit to supine: Min assist   General bed mobility comments: min guard for safety to get EOB, Min A to get B LE back into bed    Transfers Overall transfer level: Needs assistance Equipment used: 2 person hand held assist Transfers: Sit to/from Stand Sit to Stand: Min assist, +2 safety/equipment           General transfer comment: Min A to stand via handheld assist and side steps along bedside, posterior bias    Ambulation/Gait Ambulation/Gait assistance: Min assist, +2 safety/equipment Gait Distance (Feet): 4 Feet Assistive device: 2 person hand held assist Gait Pattern/deviations: Decreased stride length, Leaning posteriorly, Shuffle Gait velocity: decreased Gait velocity interpretation: <1.31 ft/sec, indicative of household ambulator   General Gait Details: Slow, shuffling side steps at EOB with Bil HHA, posterior bias noted.   Stairs             Wheelchair Mobility    Modified Rankin (Stroke Patients Only)       Balance Overall balance assessment: Needs assistance Sitting-balance support: Feet supported Sitting balance-[name]: Fair     Standing balance support: Bilateral upper extremity supported Standing balance-[name]: Poor Standing balance comment: reliant on UE support in standing                            Cognition Arousal/Alertness: Awake/alert Behavior  During Therapy: WFL for tasks assessed/performed Overall Cognitive Status: Within Functional Limits for tasks assessed                                           Exercises General Exercises - Lower Extremity Long Arc Quad: AROM, Strengthening, Both, 10 reps, Seated    General Comments General comments (skin integrity, edema, etc.): BP supine: 103/68 (73), BP sitting EOB: 89/61 (71) (stable sitting EOB), BP after standing: 79/69 (74), 105/70 (80) after returning to bed. SpO2 WFL. reported feeling "wwozy" sitting up and "nauseated" intermittently. Cued pt to perform LAQ and turned on SCDs to try to improve BP sitting/standing      Pertinent Vitals/Pain Pain Assessment Pain Assessment: Faces Faces Pain Scale: Hurts little more Pain Location: chronic low back pain Pain Descriptors / Indicators: Grimacing, Guarding, Sore Pain Intervention(s): Limited activity within patient's tolerance, Monitored during session, Repositioned    Home Living                          Prior Function            PT Goals (current goals can now be found in the care plan section) Acute Rehab PT Goals Patient Stated Goal: to improve PT Goal Formulation: With patient/family Time For Goal Achievement: 01/28/22 Potential to Achieve Goals: Good Progress towards PT goals: Progressing toward goals    Frequency    Min 3X/week      PT Plan Current plan remains appropriate    Co-evaluation PT/OT/SLP Co-Evaluation/Treatment: Yes Reason for Co-Treatment: For patient/therapist safety;To address functional/ADL transfers;Complexity of the patient's impairments (multi-system involvement) (first session since extubation) PT goals addressed during session: Mobility/safety with mobility;Balance OT goals addressed during session: ADL's and self-care;Strengthening/ROM      AM-PAC PT "6 Clicks" Mobility   Outcome Measure  Help needed turning from your back to your side while in a flat bed without using bedrails?: A Little Help needed moving from lying on your back to sitting on the side of a flat bed without using bedrails?: A Little Help needed moving to  and from a bed to a chair (including a wheelchair)?: A Little Help needed standing up from a chair using your arms (e.g., wheelchair or bedside chair)?: A Little Help needed to walk in hospital room?: A Lot Help needed climbing 3-5 steps with a railing? : A Lot 6 Click Score: 16    End of Session Equipment Utilized During Treatment: Oxygen Activity Tolerance: Patient tolerated treatment well;Other (comment) (limited by low BP) Patient left: in bed;with call bell/phone within reach;with bed alarm set;with family/visitor present Nurse Communication: Mobility status;Other (comment) (vitals, SCD machine not working properly) PT Visit Diagnosis: Unsteadiness on feet (R26.81);Muscle weakness (generalized) (M62.81);Other abnormalities of gait and mobility (R26.89);Difficulty in walking, not elsewhere classified (R26.2)     Time: 0102-7253 PT Time Calculation (min) (ACUTE ONLY): 16 min  Charges:  $Therapeutic Exercise: 8-22 mins                     Moishe Spice, PT, DPT Acute Rehabilitation Services  Pager: [phone] Office: [place] 01/14/2022, 4:23 PM

## 2022-01-14 NOTE — Progress Notes (Signed)
Echocardiogram 2D Echocardiogram has been performed.  Jefferey Pica 01/14/2022, 8:47 AM

## 2022-01-14 NOTE — Progress Notes (Signed)
Nutrition Follow-up  DOCUMENTATION CODES:   Severe malnutrition in context of chronic illness  INTERVENTION:   Given pt is NPO, start continuous tube feeds via Cortrak: - Vital 1.5 @ 50 ml/hr (1200 ml/day)  Tube feeding regimen provides 1800 kcal, 81 grams of protein, and 917 ml of H2O.   - RD will monitor for diet advancement and adjust TF regimen as appropriate  - MVI with minerals daily per tube  NUTRITION DIAGNOSIS:   Severe Malnutrition related to chronic illness (CHF, COPD) as evidenced by severe fat depletion, severe muscle depletion, percent weight loss (8.4% weight loss in less than 4 months).  Ongoing, being addressed via TF  GOAL:   Patient will meet greater than or equal to 90% of their needs  Met via continuous TF  MONITOR:   Diet advancement, Labs, Weight trends, TF tolerance, Skin, I & O's  REASON FOR ASSESSMENT:   Consult Assessment of nutrition requirement/status  ASSESSMENT:   81 year old female who presented to the ED on 1/30 with SOB. PMH of CHF, atrial fibrillation, COPD, HLD, anemia, GERD, CKD stage III, ischemic colon s/p hemicolectomy and ileostomy. Pt admitted with acute on chronic HF, AKI.  02/02 - s/p R/L heart cath 02/03 - s/p TEE-DCCV 02/06 - Cortrak placed (tip gastric), nocturnal tube feeds started 02/09 - s/p MitraClip, TEE post-procedure, Cortrak dislodged, OG tube placed 02/10 - extubated, Cortrak replaced (tip gastric)  Discussed pt with RN. Pt extubated today, doing okay with ice chips but not yet on a diet. Cortrak replaced prior to extubation. Per RN, pt with history of swallowing difficulties that this RD was not aware of. Pt remains NPO. Will start continuous tube feeds via Cortrak at this time. Will monitor for diet advancement and adjust back to nocturnal tube feeds when appropriate.  Spoke with husband at bedside who is in agreement with plan to resume tube feeds.  Admit weight: 52.2 kg Current weight: 53.8  Pt with  non-pitting edema to BLE.  Meal Completion: 50%, 50%, 50% x 3 meals on 2/08 No meals documented for 2/07 100%, 20% x 2 meals on 2/06  Medications reviewed and include: colestid, colace, IV lasix 80 mg x 1, SSI q 4 hours, MVI with minerals daily, IV protonix, miralax, amiodarone drip, heparin drip, milrinone drip, levophed drip  Labs reviewed: sodium 129, creatinine 1.49 CBG's: 108-141 x 24 hours  UOP: 2790 ml x 24 hours I/O's: +6.5 L since admit  Diet Order:   Diet Order             Diet NPO time specified  Diet effective now                   EDUCATION NEEDS:   Education needs have been addressed  Skin:  Skin Assessment: Skin Integrity Issues: Stage I: bilateral ischial tuberosity  Last BM:  01/12/22  Height:   Ht Readings from Last 1 Encounters:  01/05/22 5' 4"  (1.626 m)    Weight:   Wt Readings from Last 1 Encounters:  01/14/22 53.8 kg    BMI:  Body mass index is 20.36 kg/m.  Estimated Nutritional Needs:   Kcal:  1550-1750  Protein:  75-85 grams  Fluid:  1.5-1.7 L    Gustavus Bryant, MS, RD, LDN Inpatient Clinical Dietitian Please see AMiON for contact information.

## 2022-01-14 NOTE — Progress Notes (Signed)
NAME:  [name], MRN:  [id number], DOB:  10/25/1941, LOS: 36 ADMISSION DATE:  12/22/2021, CONSULTATION DATE:  01/24/2022 REFERRING MD:  , CHIEF COMPLAINT:  Respiratory Failure   History of Present Illness:  81 year old woman with hx of NICM, mitral failure, SSS, afib on AC, COPD who presented with decompensated heart failure.  After volume and inotrope optimization she underwent mitraclip 2/9 and remains intubated post procedure.  PCCM consulted for further management.  Pertinent  Medical History    Significant Hospital Events: Including procedures, antibiotic start and stop dates in addition to other pertinent events   mitraclip 2/9 TEE 2/9  Interim History / Subjective:  No significant events over night  Objective   Blood pressure 100/66, pulse 80, temperature 98.1 F (36.7 C), temperature source Oral, resp. rate 16, height 5\' 4"  (1.626 m), weight 53.8 kg, SpO2 96 %. CVP:  [4 mmHg-7 mmHg] 4 mmHg  Vent Mode: PRVC FiO2 (%):  [40 %-70 %] 40 % Set Rate:  [16 bmp] 16 bmp Vt Set:  [440 mL] 440 mL PEEP:  [5 cmH20] 5 cmH20 Plateau Pressure:  [12 cmH20-15 cmH20] 15 cmH20   Intake/Output Summary (Last 24 hours) at 01/14/2022 1213 Last data filed at 01/14/2022 1130 Gross per 24 hour  Intake 2679.17 ml  Output 3240 ml  Net -560.83 ml   Filed Weights   01/12/22 0437 01/21/2022 0500 01/14/22 0500  Weight: 55.1 kg 54.3 kg 53.8 kg    Examination: General: Ill appearing woman in bed. Sedated and intubated HENT: Intubated Lungs: Clear bilateral chest sounds without crackles Cardiovascular: could not appreciate mitral regurge auscultation Abdomen: Active bowel sounds Extremities: +2 pulses without edema Neuro: Sedated but responsive to commands.   Resolved Hospital Problem list     Assessment & Plan:  Acute on Chronic systolic failure: likely secondary to Mitral regurge and aFib Afib: Continue heparin and amiodarone drips per cardiology Mitral Regurge: s/p mitral clip (2/9)  continue to appreciate cardiology plan Respiratory failure - Hold fentanyl and trial off vent support later today - Plan to extubate patient tolerates off vent  Malnutrition: continue nutritional support. Tube held until extubation   Best Practice (right click and "Reselect all SmartList Selections" daily)   Diet/type: tubefeeds held until extubation DVT prophylaxis: systemic heparin GI prophylaxis: N/A Lines: yes and it is still needed Foley:  N/A Code Status:  full code Last date of multidisciplinary goals of care discussion []   Labs   CBC: Recent Labs  Lab 01/10/22 0520 01/11/22 0547 01/12/22 0350 01/21/2022 0000 01/08/2022 0500 01/11/2022 1448 01/21/2022 2051 01/14/22 0400  WBC 6.4 8.6 7.3 7.1 7.1  --   --  8.9  NEUTROABS 4.9 6.5 5.1  --  4.7  --   --  5.9  HGB 10.4* 10.1* 9.4* 10.4* 10.4* 10.9* 12.2 11.7*  HCT 33.9* 32.7* 29.6* 33.4* 33.2* 32.0* 36.0 36.4  MCV 86.0 84.1 85.1 85.4 84.9  --   --  82.9  PLT 169 181 160 175 176  --   --  086    Basic Metabolic Panel: Recent Labs  Lab 01/11/22 0547 01/12/22 0350 01/12/22 1702 01/16/2022 0000 01/11/2022 0500 01/18/2022 1448 01/15/2022 2051 01/14/22 0400  NA 129* 132*  --  129* 129* 127* 128* 129*  K 3.6 3.7  --  4.0 4.0 4.2 3.7 3.7  CL 91* 92*  --  87* 85*  --   --  85*  CO2 27 29  --  31 31  --   --  31  GLUCOSE 200* 128*  --  253* 275*  --   --  124*  BUN 19 16  --  19 18  --   --  17  CREATININE 1.19* 1.33*  --  1.34* 1.39*  --   --  1.49*  CALCIUM 8.5* 8.6*  --  8.6* 8.7*  --   --  9.1  MG 2.3 2.1 2.0  --  1.9  --   --  1.7  PHOS 3.2 3.5 3.6  --  4.0  --   --   --    GFR: Estimated Creatinine Clearance: 24.7 mL/min (A) (by C-G formula based on SCr of 1.49 mg/dL (H)). Recent Labs  Lab 01/12/22 0350 01/27/2022 0000 01/31/2022 0500 01/14/22 0400  WBC 7.3 7.1 7.1 8.9    Liver Function Tests: No results for input(s): AST, ALT, ALKPHOS, BILITOT, PROT, ALBUMIN in the last 168 hours. No results for input(s): LIPASE,  AMYLASE in the last 168 hours. No results for input(s): AMMONIA in the last 168 hours.  ABG    Component Value Date/Time   PHART 7.493 (H) 01/26/2022 2051   PCO2ART 46.7 01/09/2022 2051   PO2ART 79 (L) 01/25/2022 2051   HCO3 35.9 (H) 01/21/2022 2051   TCO2 37 (H) 01/29/2022 2051   ACIDBASEDEF 2.0 07/27/2016 1013   O2SAT 74.0 01/14/2022 0410     Coagulation Profile: Recent Labs  Lab 01/09/2022 0000  INR 1.1    Cardiac Enzymes: No results for input(s): CKTOTAL, CKMB, CKMBINDEX, TROPONINI in the last 168 hours.  HbA1C: Hgb A1c MFr Bld  Date/Time Value Ref Range Status  01/14/2022 04:00 AM 5.2 4.8 - 5.6 % Final    Comment:    (NOTE) Pre diabetes:          5.7%-6.4%  Diabetes:              >6.4%  Glycemic control for   <7.0% adults with diabetes   01/16/2022 05:00 AM 5.3 4.8 - 5.6 % Final    Comment:    (NOTE) Pre diabetes:          5.7%-6.4%  Diabetes:              >6.4%  Glycemic control for   <7.0% adults with diabetes     CBG: Recent Labs  Lab 01/23/2022 1946 01/17/2022 2355 01/14/22 0421 01/14/22 0750 01/14/22 1135  GLUCAP 132* 129* 141* 118* 133*    Review of Systems:   As per HPI  Past Medical History:  She,  has a past medical history of Allergic rhinitis, Anxiety, Atrial septal aneurysm, Basal cell carcinoma, Bradycardia, Cataract, Chronic combined systolic and diastolic CHF (congestive heart failure) ([place]), CKD (chronic kidney disease), stage III ([place]), Clostridium difficile infection, COPD (chronic obstructive pulmonary disease) ([place]), Depression (11/29/2013), DI (detrusor instability), Esophageal stricture, Hemorrhoids, Hiatal hernia, History of kidney stones, HLD (hyperlipidemia), HTN (hypertension), Ischemic colon ([place]), Lung nodule, Mild CAD, Mitral regurgitation, Nephrolithiasis, NICM (nonischemic cardiomyopathy) ([place]), Orthostatic hypotension, Osteoarthritis, PAF (paroxysmal atrial fibrillation) ([place]), Pneumonia (2013), PVC's (premature  ventricular contractions), S/P mitral valve clip implantation (01/31/2022), Shingles (since Nov 18, 2013), Status post dilation of esophageal narrowing, Stroke ([place]) (2001), Syncope, and TIA (transient ischemic attack) (last 2001).   Surgical History:   Past Surgical History:  Procedure Laterality Date   ANTERIOR LATERAL LUMBAR FUSION 4 LEVELS Right 07/03/2015   Procedure: Right Lumbar one-two, Lumbar two-three, Lumbar three-four, Lumbar four-five Anterior lateral lumbar interbody fusion;  Surgeon: Erline Levine, MD;  Location:  [facility];  Service: Neurosurgery;  Laterality: Right;  Right L1-2 L2-3 L3-4 L4-5 Anterior lateral lumbar interbody fusion   BALLOON DILATION N/A 03/03/2014   Procedure: BALLOON DILATION;  Surgeon: Inda Castle, MD;  Location: [facility];  Service: Endoscopy;  Laterality: N/A;   BLADDER SUSPENSION     BREAST BIOPSY Right 07/03/2006   BUBBLE STUDY  12/28/2021   Procedure: BUBBLE STUDY;  Surgeon: Berniece Salines, DO;  Location: [facility];  Service: Cardiovascular;;   CARDIAC CATHETERIZATION N/A 08/22/2016   Procedure: Left Heart Cath and Coronary Angiography;  Surgeon: Peter M Martinique, MD;  Location: [facility];  Service: Cardiovascular;  Laterality: N/A;   CARDIOVERSION N/A 08/01/2016   Procedure: CARDIOVERSION;  Surgeon: Jerline Pain, MD;  Location: [place];  Service: Cardiovascular;  Laterality: N/A;   CARDIOVERSION N/A 10/11/2016   Procedure: CARDIOVERSION;  Surgeon: Sueanne Margarita, MD;  Location: [place];  Service: Cardiovascular;  Laterality: N/A;   CARDIOVERSION N/A 01/21/2022   Procedure: CARDIOVERSION;  Surgeon: Jolaine Artist, MD;  Location: [facility];  Service: Cardiovascular;  Laterality: N/A;   COLONOSCOPY     CYSTOSCOPY WITH RETROGRADE PYELOGRAM, URETEROSCOPY AND STENT PLACEMENT Left 11/04/2021   Procedure: CYSTOSCOPY WITH RETROGRADE PYELOGRAM, URETEROSCOPY  HOLMIUM LASER AND STENT PLACEMENT;  Surgeon: Ardis Hughs, MD;   Location: [facility];  Service: Urology;  Laterality: Left;   ESOPHAGOGASTRODUODENOSCOPY N/A 03/03/2014   Procedure: ESOPHAGOGASTRODUODENOSCOPY (EGD);  Surgeon: Inda Castle, MD;  Location: [facility];  Service: Endoscopy;  Laterality: N/A;   EXCISION OF BASAL CELL CA  2011   FINGER SURGERY     GLUTEUS MINIMUS REPAIR Right 03/15/2016   Procedure: RIGHT HIP GLUTEUS MEDIUS TENDON REPAIR;  Surgeon: Paralee Cancel, MD;  Location: [facility];  Service: Orthopedics;  Laterality: Right;   ileostomy reversal     LUMBAR PERCUTANEOUS PEDICLE SCREW 4 LEVEL Right 07/03/2015   Procedure: Lumbar one-Sacral one Bilateral percutaneous pedicle screws;  Surgeon: Erline Levine, MD;  Location: [place] [facility];  Service: Neurosurgery;  Laterality: Right;  L1-S1 Bilateral percutaneous pedicle screws   RECTOCELE REPAIR  2008   A&P REPAIR with vaginal repair-DR. MCDIARMID   RIGHT/LEFT HEART CATH AND CORONARY ANGIOGRAPHY N/A 01/25/2022   Procedure: RIGHT/LEFT HEART CATH AND CORONARY ANGIOGRAPHY;  Surgeon: Jolaine Artist, MD;  Location: [place] [facility];  Service: Cardiovascular;  Laterality: N/A;   TEE WITHOUT CARDIOVERSION N/A 12/28/2021   Procedure: TRANSESOPHAGEAL ECHOCARDIOGRAM (TEE);  Surgeon: Berniece Salines, DO;  Location: [facility];  Service: Cardiovascular;  Laterality: N/A;   TEE WITHOUT CARDIOVERSION N/A 01/25/2022   Procedure: TRANSESOPHAGEAL ECHOCARDIOGRAM (TEE);  Surgeon: Jolaine Artist, MD;  Location: [place];  Service: Cardiovascular;  Laterality: N/A;   THUMB SURGERY Right 10-15 yrs ago   [place]   NO BSO-DR. MABRY   VARICOSE VEIN SURGERY       Social History:   reports that she quit smoking about 26 years ago. Her smoking use included cigarettes. She started smoking about 67 years ago. She has a 126.00 pack-year smoking history. She has never used smokeless tobacco. She reports that she does not drink alcohol and does not use drugs.    Family History:  Her family history includes Breast cancer in her maternal aunt, maternal aunt, maternal aunt, and mother; COPD in her mother; Emphysema in her mother; Uterine cancer in her mother. There is no history of Colon cancer, Esophageal cancer, Rectal cancer,  Stomach cancer, or Pancreatic cancer.   Allergies Allergies  Allergen Reactions   Clarithromycin Other (See Comments)    REACTION: possible rash but could have been legionarres dz with rash   Lipitor [Atorvastatin] Other (See Comments)    MUSCLE SPASMS   Simvastatin Other (See Comments)    Muscle and joint pain   Cardizem [Diltiazem Hcl] Hives   Contrast Media [Iodinated Contrast Media]     FYI - during admission at [facility] 08/2016 patient developed hives, question related to cardizem or contrast dye - not clear   Diltiazem     Other reaction(s): rash   Statins     Other reaction(s): leg cramps   Rosuvastatin Calcium Other (See Comments)    fagitue and joint pain, "NO STATINS"     Home Medications  Prior to Admission medications   Medication Sig Start Date End Date Taking? Authorizing Provider  albuterol (VENTOLIN HFA) 108 (90 Base) MCG/ACT inhaler Inhale 1-2 puffs into the lungs every 6 (six) hours as needed for wheezing or shortness of breath. 12/08/20  Yes Parrett, Fonnie Mu, NP  apixaban (ELIQUIS) 2.5 MG TABS tablet Take 2.5 mg by mouth 2 (two) times daily.   Yes [provider]  buPROPion (WELLBUTRIN XL) 300 MG 24 hr tablet Take 1 tablet (300 mg total) by mouth daily. 10/26/21  Yes Biagio Borg, MD  Calcium Carbonate-Vitamin D (CALCIUM + D PO) Take 1 tablet by mouth 2 (two) times daily.   Yes [provider]  colestipol (COLESTID) 1 g tablet Take 1 g by mouth 2 (two) times daily.   Yes [provider]  ezetimibe (ZETIA) 10 MG tablet Take 1 tablet (10 mg total) by mouth daily. 10/26/21 01/24/22 Yes Biagio Borg, MD  ferrous sulfate 325 (65 FE) MG tablet Take 325 mg by mouth daily  with breakfast.   Yes [provider]  fluticasone (FLONASE) 50 MCG/ACT nasal spray Place 1 spray into both nostrils 2 (two) times daily. 06/10/21  Yes Parrett, Tammy S, NP  Fluticasone-Umeclidin-Vilant (TRELEGY ELLIPTA) 100-62.5-25 MCG/INH AEPB Inhale 1 puff into the lungs daily. I inhalation once daily 12/08/20  Yes Parrett, Tammy S, NP  furosemide (LASIX) 20 MG tablet Take 2 tablets (40 mg total) by mouth daily. 12/17/21  Yes Cobb, Karie Schwalbe, NP  gabapentin (NEURONTIN) 300 MG capsule 1 tab by mouth in AM, 1 po at midday, then 2 by mouth at bedtime for sleep and pain Patient taking differently: Take 300-600 mg by mouth See admin instructions. Take 300 mg by mouth in the morning and 600 mg at night 06/17/21  Yes Biagio Borg, MD  guaiFENesin (MUCINEX) 600 MG 12 hr tablet Take 1,200 mg by mouth daily.   Yes [provider]  hydroxychloroquine (PLAQUENIL) 200 MG tablet Take 400 mg by mouth at bedtime.   Yes [provider]  ipratropium (ATROVENT) 0.03 % nasal spray Place 2 sprays into both nostrils 3 (three) times daily as needed for rhinitis. 06/10/21  Yes Parrett, Tammy S, NP  ipratropium-albuterol (DUONEB) 0.5-2.5 (3) MG/3ML SOLN Take 3 mLs by nebulization every 6 (six) hours as needed (shortness of breath or wheezing). 12/09/21  Yes Cobb, Karie Schwalbe, NP  levothyroxine (SYNTHROID) 25 MCG tablet Take 1 tablet (25 mcg total) by mouth daily before breakfast. Annual appt due in Nov must see provider for future refills 12/24/20  Yes Biagio Borg, MD  Lidocaine HCl 2 % CREA Apply 1 application topically daily as needed (for shingles flares).  Yes [provider]  loratadine (CLARITIN) 10 MG tablet Take 10 mg by mouth daily.   Yes [provider]  Melatonin 10 MG TABS Take 10 mg by mouth at bedtime.   Yes [provider]  metoprolol succinate (TOPROL-XL) 100 MG 24 hr tablet Take 1 tablet (100 mg total) by mouth daily. Take with or immediately following a  meal. Patient taking differently: Take 100 mg by mouth every evening. Take with or immediately following a meal. 09/24/21  Yes Camnitz, Will Hassell Done, MD  mirabegron ER (MYRBETRIQ) 50 MG TB24 tablet Take 50 mg by mouth at bedtime.   Yes [provider]  montelukast (SINGULAIR) 10 MG tablet Take 1 tablet (10 mg total) by mouth daily. Annual appt due in Nov must see provider for future refills 11/05/21  Yes Biagio Borg, MD  Multiple Vitamin (MULTIVITAMIN) tablet Take 2 tablets by mouth daily.   Yes [provider]  omeprazole (PRILOSEC) 40 MG capsule Take 1 capsule (40 mg total) by mouth daily. Patient taking differently: Take 40 mg by mouth every evening. 05/28/21  Yes Biagio Borg, MD  Probiotic Product (PROBIOTIC DAILY PO) Take 1 capsule by mouth in the morning and at bedtime.   Yes [provider]  traZODone (DESYREL) 50 MG tablet Take 1 tablet (50 mg total) by mouth at bedtime and may repeat dose one time if needed. Patient taking differently: Take 100 mg by mouth at bedtime. 09/24/21  Yes Biagio Borg, MD  benzonatate (TESSALON) 200 MG capsule Take 1 capsule (200 mg total) by mouth 3 (three) times daily as needed for cough. Patient not taking: Reported on 01/04/2022 08/10/21 08/10/22  Parrett, Fonnie Mu, NP  Biotin 5 MG CAPS Take 5 mg by mouth daily.    [provider]  cyclobenzaprine (FLEXERIL) 5 MG tablet Take 1 tablet (5 mg total) by mouth 3 (three) times daily as needed for muscle spasms. Patient not taking: Reported on 01/04/2022 10/17/18   Biagio Borg, MD  furosemide (LASIX) 40 MG tablet Take 1 tablet (40 mg total) by mouth daily. Patient not taking: Reported on 12/23/2021 12/13/21   Clayton Bibles, NP  promethazine-dextromethorphan (PROMETHAZINE-DM) 6.25-15 MG/5ML syrup Take 2.5 mLs by mouth 4 (four) times daily as needed for cough. Patient not taking: Reported on 01/04/2022 12/09/21   Clayton Bibles, NP     Critical care time:

## 2022-01-14 NOTE — Procedures (Signed)
Extubation Procedure Note  Patient Details:   Name: [name] DOB: [birth year] MRN: [id number]   Airway Documentation:  Airway 7.5 mm (Active)  Secured at (cm) 22 cm 01/14/22 1119  Measured From Lips 01/14/22 [place] 01/14/22 1119  Secured By [employer] 01/14/22 1119  Tube Holder Repositioned Yes 01/14/22 1119  Prone position No 01/14/22 0333  Cuff Pressure (cm H2O) [place] OR 18-26 [facility] 01/14/22 0804  Site Condition Dry 01/14/22 1119   Vent end date: (not recorded) Vent end time: (not recorded)   Evaluation  O2 sats: stable throughout Complications: No apparent complications Patient did tolerate procedure well. Bilateral Breath Sounds: Clear, Diminished   Yes, patient able to speak.  Patient extubated per MD order with RN and resident at bedside. Patient placed on 4L nasal cannula. Vitals stable.   Seward Speck 01/14/2022, 2:01 PM

## 2022-01-14 NOTE — Procedures (Signed)
Cortrak  Person Inserting Tube:  Esaw Dace, RD Tube Type:  Cortrak - 43 inches Tube Size:  10 Tube Location:  Left nare Initial Placement:  Stomach Secured by: Bridle Technique Used to Measure Tube Placement:  Marking at nare/corner of mouth Cortrak Secured At:  70 cm  Cortrak Tube Team Note:  Consult received to place a Cortrak feeding tube.   X-ray is required, abdominal x-ray has been ordered by the Cortrak team. Please confirm tube placement before using the Cortrak tube.   If the tube becomes dislodged please keep the tube and contact the Cortrak team at [email or web address] ([email or web address]) for replacement.  If after hours and replacement cannot be delayed, place a NG tube and confirm placement with an abdominal x-ray.    Kerman Passey MS, RDN, LDN, CNSC Registered Dietitian III Clinical Nutrition RD Pager and On-Call Pager Number Located in [place]

## 2022-01-14 NOTE — Progress Notes (Signed)
OT Cancellation Note  Patient Details Name: [name] MRN: [id number] DOB: Jul 07, 1941   Cancelled Treatment:    Reason Eval/Treat Not Completed: Patient not medically ready Per RN, plans to extubate this AM. Will follow-up for OT session in PM as schedule permits.  Layla Maw 01/14/2022, 8:41 AM

## 2022-01-14 NOTE — Progress Notes (Signed)
RN gave patient ice chips post extubation and patient tolerating fine with no signs of aspiration.   RN gave patient a sip of water to assess swallowing, patient coughed post swallow but has been coughing up sputum since extubation.  Patient stated she did not feel the cough was triggered from water however patient then began to complain of nausea and felt that she was going to vomit.  Patient had complained of dizziness and nausea during mobility and did have some complaints of dizziness when HOB elevated prior to giving the patient water.     RN to keep patient NPO at this time.

## 2022-01-14 NOTE — Progress Notes (Signed)
ANTICOAGULATION CONSULT NOTE - Follow Up Consult  Pharmacy Consult for heparin Indication: atrial fibrillation  Labs: Recent Labs    01/12/22 0350 01/20/2022 0000 01/21/2022 0500 01/16/2022 1448 01/09/2022 2051 01/14/22 0400  HGB 9.4* 10.4* 10.4* 10.9* 12.2 11.7*  HCT 29.6* 33.4* 33.2* 32.0* 36.0 36.4  PLT 160 175 176  --   --  211  LABPROT  --  14.1  --   --   --   --   INR  --  1.1  --   --   --   --   HEPARINUNFRC 0.32  --  0.32  --   --  0.35  CREATININE 1.33* 1.34* 1.39*  --   --  1.49*    Assessment: 81yo female who was on apix PTA 2.5mg  BID for a fib that was left off med rec and not continued on admit to ED on 1/30. Patient underwent TEE/DCCV 2/3 now s/p MitraClip 2/9.   Heparin level this morning is at goal on 1300 units/hr. No infusion issues or s/sx of bleeding noted. CBC stable overnight.   Goal of Therapy:  Heparin level 0.3-0.7 units/ml   Plan:  Continue IV heparin to 1300 units/hr. Daily heparin level and CBC while on heparin  Erin Hearing PharmD., BCPS Clinical Pharmacist 01/14/2022 9:04 AM

## 2022-01-14 NOTE — Plan of Care (Signed)

## 2022-01-15 DIAGNOSIS — I959 Hypotension, unspecified: Secondary | ICD-10-CM | POA: Diagnosis not present

## 2022-01-15 DIAGNOSIS — R197 Diarrhea, unspecified: Secondary | ICD-10-CM

## 2022-01-15 DIAGNOSIS — K529 Noninfective gastroenteritis and colitis, unspecified: Secondary | ICD-10-CM

## 2022-01-15 DIAGNOSIS — N179 Acute kidney failure, unspecified: Secondary | ICD-10-CM | POA: Diagnosis not present

## 2022-01-15 DIAGNOSIS — I4891 Unspecified atrial fibrillation: Secondary | ICD-10-CM | POA: Diagnosis not present

## 2022-01-15 DIAGNOSIS — I5043 Acute on chronic combined systolic (congestive) and diastolic (congestive) heart failure: Secondary | ICD-10-CM | POA: Diagnosis not present

## 2022-01-15 LAB — BASIC METABOLIC PANEL
Anion gap: 12 (ref 5–15)
Anion gap: 11 (ref 5–15)
Anion gap: 9 (ref 5–15)
BUN: 21 mg/dL (ref 8–23)
BUN: 22 mg/dL (ref 8–23)
BUN: 20 mg/dL (ref 8–23)
CO2: 33 mmol/L — ABNORMAL HIGH (ref 22–32)
CO2: 32 mmol/L (ref 22–32)
CO2: 32 mmol/L (ref 22–32)
Calcium: 8.2 mg/dL — ABNORMAL LOW (ref 8.9–10.3)
Calcium: 8.6 mg/dL — ABNORMAL LOW (ref 8.9–10.3)
Calcium: 8.5 mg/dL — ABNORMAL LOW (ref 8.9–10.3)
Chloride: 76 mmol/L — ABNORMAL LOW (ref 98–111)
Chloride: 82 mmol/L — ABNORMAL LOW (ref 98–111)
Chloride: 84 mmol/L — ABNORMAL LOW (ref 98–111)
Creatinine, Ser: 1.51 mg/dL — ABNORMAL HIGH (ref 0.44–1.00)
Creatinine, Ser: 1.58 mg/dL — ABNORMAL HIGH (ref 0.44–1.00)
Creatinine, Ser: 1.61 mg/dL — ABNORMAL HIGH (ref 0.44–1.00)
GFR, Estimated: 34 mL/min — ABNORMAL LOW (ref >60)
GFR, Estimated: 32 mL/min — ABNORMAL LOW (ref >60)
GFR, Estimated: 32 mL/min — ABNORMAL LOW (ref >60)
Glucose, Bld: 356 mg/dL — ABNORMAL HIGH (ref 70–99)
Glucose, Bld: 172 mg/dL — ABNORMAL HIGH (ref 70–99)
Glucose, Bld: 143 mg/dL — ABNORMAL HIGH (ref 70–99)
Potassium: 3.5 mmol/L (ref 3.5–5.1)
Potassium: 3.7 mmol/L (ref 3.5–5.1)
Potassium: 4.7 mmol/L (ref 3.5–5.1)
Sodium: 121 mmol/L — ABNORMAL LOW (ref 135–145)
Sodium: 125 mmol/L — ABNORMAL LOW (ref 135–145)
Sodium: 125 mmol/L — ABNORMAL LOW (ref 135–145)

## 2022-01-15 LAB — HEPATIC FUNCTION PANEL
ALT: 10 U/L (ref 0–44)
AST: 16 U/L (ref 15–41)
Albumin: 2.5 g/dL — ABNORMAL LOW (ref 3.5–5.0)
Alkaline Phosphatase: 82 U/L (ref 38–126)
Bilirubin, Direct: 0.1 mg/dL (ref 0.0–0.2)
Indirect Bilirubin: 0.3 mg/dL (ref 0.3–0.9)
Total Bilirubin: 0.4 mg/dL (ref 0.3–1.2)
Total Protein: 5.3 g/dL — ABNORMAL LOW (ref 6.5–8.1)

## 2022-01-15 LAB — CBC WITH DIFFERENTIAL/PLATELET
Abs Immature Granulocytes: 0.06 10*3/uL (ref 0.00–0.07)
Basophils Absolute: 0 10*3/uL (ref 0.0–0.1)
Basophils Relative: 0 %
Eosinophils Absolute: 0.1 10*3/uL (ref 0.0–0.5)
Eosinophils Relative: 2 %
HCT: 34.3 % — ABNORMAL LOW (ref 36.0–46.0)
Hemoglobin: 10.7 g/dL — ABNORMAL LOW (ref 12.0–15.0)
Immature Granulocytes: 1 %
Lymphocytes Relative: 12 %
Lymphs Abs: 1.2 10*3/uL (ref 0.7–4.0)
MCH: 26.2 pg (ref 26.0–34.0)
MCHC: 31.2 g/dL (ref 30.0–36.0)
MCV: 83.9 fL (ref 80.0–100.0)
Monocytes Absolute: 1.8 10*3/uL — ABNORMAL HIGH (ref 0.1–1.0)
Monocytes Relative: 19 %
Neutro Abs: 6.4 10*3/uL (ref 1.7–7.7)
Neutrophils Relative %: 66 %
Platelets: 169 10*3/uL (ref 150–400)
RBC: 4.09 MIL/uL (ref 3.87–5.11)
RDW: 17.7 % — ABNORMAL HIGH (ref 11.5–15.5)
WBC: 9.6 10*3/uL (ref 4.0–10.5)
nRBC: 0 % (ref 0.0–0.2)

## 2022-01-15 LAB — GLUCOSE, CAPILLARY
Glucose-Capillary: 131 mg/dL — ABNORMAL HIGH (ref 70–99)
Glucose-Capillary: 137 mg/dL — ABNORMAL HIGH (ref 70–99)
Glucose-Capillary: 140 mg/dL — ABNORMAL HIGH (ref 70–99)
Glucose-Capillary: 152 mg/dL — ABNORMAL HIGH (ref 70–99)
Glucose-Capillary: 158 mg/dL — ABNORMAL HIGH (ref 70–99)
Glucose-Capillary: 170 mg/dL — ABNORMAL HIGH (ref 70–99)

## 2022-01-15 LAB — MAGNESIUM: Magnesium: 2.1 mg/dL (ref 1.7–2.4)

## 2022-01-15 LAB — BRAIN NATRIURETIC PEPTIDE: B Natriuretic Peptide: 1297.4 pg/mL — ABNORMAL HIGH (ref 0.0–100.0)

## 2022-01-15 LAB — COOXEMETRY PANEL
Carboxyhemoglobin: 1.1 % (ref 0.5–1.5)
Methemoglobin: 0.9 % (ref 0.0–1.5)
O2 Saturation: 72.3 %
Total hemoglobin: 11 g/dL — ABNORMAL LOW (ref 12.0–16.0)

## 2022-01-15 LAB — HEPARIN LEVEL (UNFRACTIONATED): Heparin Unfractionated: 0.35 IU/mL (ref 0.30–0.70)

## 2022-01-15 MED ORDER — LOPERAMIDE HCL 2 MG PO CAPS
2.0000 mg | ORAL_CAPSULE | ORAL | Status: AC | PRN
  Administered 2022-01-15 – 2022-01-18 (×4): 2 mg via ORAL
  Filled 2022-01-15 (×4): qty 1

## 2022-01-15 MED ORDER — ACETAZOLAMIDE 250 MG PO TABS
250.0000 mg | ORAL_TABLET | Freq: Two times a day (BID) | ORAL | Status: DC
  Administered 2022-01-15 – 2022-01-16 (×3): 250 mg via ORAL
  Filled 2022-01-15 (×3): qty 1

## 2022-01-15 MED ORDER — POTASSIUM CHLORIDE 20 MEQ PO PACK
40.0000 meq | PACK | Freq: Once | ORAL | Status: AC
  Administered 2022-01-15: 40 meq
  Filled 2022-01-15: qty 2

## 2022-01-15 MED ORDER — ALPRAZOLAM 0.25 MG PO TABS
0.2500 mg | ORAL_TABLET | Freq: Three times a day (TID) | ORAL | Status: DC | PRN
  Administered 2022-01-15 – 2022-01-19 (×9): 0.25 mg
  Filled 2022-01-15 (×9): qty 1

## 2022-01-15 MED ORDER — ONDANSETRON HCL 4 MG/2ML IJ SOLN
4.0000 mg | INTRAMUSCULAR | Status: DC | PRN
  Administered 2022-01-15 – 2022-01-18 (×8): 4 mg via INTRAVENOUS
  Filled 2022-01-15 (×8): qty 2

## 2022-01-15 MED ORDER — TOLVAPTAN 15 MG PO TABS
15.0000 mg | ORAL_TABLET | Freq: Once | ORAL | Status: AC
  Administered 2022-01-15: 15 mg via ORAL
  Filled 2022-01-15: qty 1

## 2022-01-15 NOTE — Progress Notes (Signed)
NAME:  [name], MRN:  [id number], DOB:  08-Nov-1941, LOS: 26 ADMISSION DATE:  12/11/2021, CONSULTATION DATE:  01/16/2022 REFERRING MD:  , CHIEF COMPLAINT:  Respiratory Failure   History of Present Illness:  81 year old woman with hx of NICM, mitral failure, SSS, afib on AC, COPD who presented with decompensated heart failure.  After volume and inotrope optimization she underwent mitraclip 2/9 and remains intubated post procedure.  PCCM consulted for further management.  Pertinent  Medical History   Past Medical History:  Diagnosis Date   Allergic rhinitis    Anxiety    Atrial septal aneurysm    Basal cell carcinoma    Bradycardia    Cataract    bil cataracts removed   Chronic combined systolic and diastolic CHF (congestive heart failure) (HCC)    CKD (chronic kidney disease), stage III (HCC)    Clostridium difficile infection    COPD (chronic obstructive pulmonary disease) ([place])    Depression 11/29/2013   DI (detrusor instability)    Esophageal stricture    Hemorrhoids    Hiatal hernia    History of kidney stones    HLD (hyperlipidemia)    HTN (hypertension)    Ischemic colon ([place])    a. 08/2016 - adm to  [facility] with a ruptured colon s/p right hemicolectomy with ileostomy formation for ischemic and necrotic colon.    Lung nodule    a. followed by pulmonology   Mild CAD    a. minor nonobstructive CAD 08/22/16.   Mitral regurgitation    Nephrolithiasis    hx   NICM (nonischemic cardiomyopathy) (HCC)    Orthostatic hypotension    Osteoarthritis    PAF (paroxysmal atrial fibrillation) ([place])    a. diagnosed 07/2016 s/p DCCV 08/01/16 with recurrence, managed with rate control for now.    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 Burt Knack and Dr. Ali Lowe   Shingles since Nov 18, 2013   on right arm from shoulder to wrist   Status post  dilation of esophageal narrowing    Stroke [facility]) 2001   stroke affected left leg   Syncope    a. 08/2016 felt due to orthostatic hypotension.   TIA (transient ischemic attack) last 2001   anticoagulation therapy on coumadin     Significant Hospital Events: Including procedures, antibiotic start and stop dates in addition to other pertinent events   mitraclip 2/9 TEE 2/9 2/10 extubated  Interim History / Subjective:  Complains of diarrhea.   Objective   Blood pressure (!) 88/57, pulse (!) 105, temperature 98.1 F (36.7 C), temperature source Oral, resp. rate 18, height 5\' 4"  (1.626 m), weight 55.6 kg, SpO2 94 %. CVP:  [1 mmHg-13 mmHg] 11 mmHg      Intake/Output Summary (Last 24 hours) at 01/15/2022 1520 Last data filed at 01/15/2022 1400 Gross per 24 hour  Intake 2593.55 ml  Output 1165 ml  Net 1428.55 ml    Filed Weights   01/15/2022 0500 01/14/22 0500 01/15/22 0500  Weight: 54.3 kg 53.8 kg 55.6 kg    Examination: General: Ill appearing woman in bed. Sitting in bed. Cachectic.  HENT: Cortrak tube in place.  Lungs: Occasional scattered crackles.  Cardiovascular: 3/6 aortic murmur and 2/6 mitral murmur. LV heave.  JVP at 2cm Abdomen: Active bowel sounds Extremities: +2 pulses without edema Neuro: Generalized weakness.  Ancillary tests personally reviewed:   Echo shows low EF. Biventricular failure.  Hyponatremia to 283 Metabolic Alkalosis.  BNP down by half from admission.  Assessment & Plan:   Critically ill due to cardiogenic shock due to decompensated HFrEF from NICM Severe MR now status post mitral clip.  Severe Malnutrition with cachexia related to HF Persistent AF Chronic respiratory insufficiency due to COPD Hyponatremia due to volume overload.  Chronic diarrhea  Plan:  - Progressive ambulation - Wean O2 to keep sat > 92% - Tolvaptan and diamox today - Wean NE to keep MAP >65 - Continue milrinone at current dose - Remains on amiodarone at 60.   - Colestid and immodium for diarrhea.   Best Practice (right click and "Reselect all SmartList Selections" daily)   Diet/type: continue tube feeds and introduce oral intake DVT prophylaxis: systemic heparin GI prophylaxis: N/A Lines: yes and it is still needed Foley:  N/A Code Status:  full code Last date of multidisciplinary goals of care discussion []   CRITICAL CARE Performed by: Kipp Brood   Total critical care time: 35 minutes  Critical care time was exclusive of separately billable procedures and treating other patients.  Critical care was necessary to treat or prevent imminent or life-threatening deterioration.  Critical care was time spent personally by me on the following activities: development of treatment plan with patient and/or surrogate as well as nursing, discussions with consultants, evaluation of patient's response to treatment, examination of patient, obtaining history from patient or surrogate, ordering and performing treatments and interventions, ordering and review of laboratory studies, ordering and review of radiographic studies, pulse oximetry, re-evaluation of patient's condition and participation in multidisciplinary rounds.  Kipp Brood, MD [facility] ICU Physician [place]  Pager: [phone] Mobile: [phone] After hours: [phone].   01/15/2022, 3:32 PM

## 2022-01-15 NOTE — Progress Notes (Signed)
Advanced Heart Failure Rounding Note  PCP-Cardiologist: Lauren Meredith Leeds, MD   Subjective:    2/1 A fib RVR--> Started on amio drip + heparin drip. PICC placed. Diuretics held.  2/2 RHC/LHC - Had cath with preserved cardiac output, min nonob CAD, elevated PCWP suggestive of severe MR.  2/4 TEE/DC-CV  LVEF 15% RV severely HK. DC-CV to NSR 2/7 Milrinone added for low co-ox (50%). IV diuretics restarted  02/09 s/p Mitraclip A2/P2  Remains on milrinone 0.125 + NE 13. (Diastolics low. NE being titrated to MAPs)  Echo 2/10 EF < 20% RV moderately decreased MV clip ok. Mild to mod MR. Personally reviewed   CVP 6-7 (checked personally) Co-ox 72%  Na 129 -> 121 -> 125  Developing contraction alkalosis. Scr stable at 1.5   Remains in AF on IV amio.   Feels her breathing is better. No more orthopnea..Having diarrhea from TFs.    Objective:   Weight Range: 55.6 kg Body mass index is 21.04 kg/m.   Vital Signs:   Temp:  [97.8 F (36.6 C)-98.1 F (36.7 C)] 97.8 F (36.6 C) (02/11 0345) Pulse Rate:  [71-109] 102 (02/11 0816) Resp:  [11-22] 15 (02/11 0816) BP: (75-106)/(55-78) 103/66 (02/11 0816) SpO2:  [87 %-99 %] 96 % (02/11 0816) Arterial Line BP: (59-125)/(42-83) 120/51 (02/11 0600) FiO2 (%):  [40 %] 40 % (02/10 1200) Weight:  [55.6 kg] 55.6 kg (02/11 0500) Last BM Date: 01/12/22  Weight change: Filed Weights   01/28/2022 0500 01/14/22 0500 01/15/22 0500  Weight: 54.3 kg 53.8 kg 55.6 kg    Intake/Output:   Intake/Output Summary (Last 24 hours) at 01/15/2022 0955 Last data filed at 01/15/2022 0816 Gross per 24 hour  Intake 2659.66 ml  Output 1490 ml  Net 1169.66 ml       Physical Exam   General:  Elderly cachetic No resp difficulty HEENT: normal + coretrak Neck: supple. JVP 6-7. Carotids 2+ bilat; no bruits. No lymphadenopathy or thryomegaly appreciated. Cor: PMI nondisplaced. Irregular rate & rhythm. No rubs, gallops or murmurs. Lungs: decreased  throughout Abdomen: soft, nontender, nondistended. No hepatosplenomegaly. No bruits or masses. Good bowel sounds. Extremities: no cyanosis, clubbing, rash, edema Neuro: alert & orientedx3, cranial nerves grossly intact. moves all 4 extremities w/o difficulty. Affect pleasant   Telemetry   AF 90-105, rare PVCs Personally reviewed   Labs    CBC Recent Labs    01/14/22 0400 01/15/22 0434  WBC 8.9 9.6  NEUTROABS 5.9 6.4  HGB 11.7* 10.7*  HCT 36.4 34.3*  MCV 82.9 83.9  PLT 211 423    Basic Metabolic Panel Recent Labs    01/12/22 1702 01/17/2022 0000 01/09/2022 0500 01/05/2022 1448 01/14/22 0400 01/15/22 0434  NA  --    < > 129*   < > 129* 121*  K  --    < > 4.0   < > 3.7 3.5  CL  --    < > 85*  --  85* 76*  CO2  --    < > 31  --  31 33*  GLUCOSE  --    < > 275*  --  124* 356*  BUN  --    < > 18  --  17 21  CREATININE  --    < > 1.39*  --  1.49* 1.51*  CALCIUM  --    < > 8.7*  --  9.1 8.2*  MG 2.0  --  1.9  --  1.7 2.1  PHOS 3.6  --  4.0  --   --   --    < > = values in this interval not displayed.    Liver Function Tests No results for input(s): AST, ALT, ALKPHOS, BILITOT, PROT, ALBUMIN in the last 72 hours.  No results for input(s): LIPASE, AMYLASE in the last 72 hours. Cardiac Enzymes No results for input(s): CKTOTAL, CKMB, CKMBINDEX, TROPONINI in the last 72 hours.  BNP: BNP (last 3 results) Recent Labs    12/17/2021 1737  BNP 2,812.3*     ProBNP (last 3 results) No results for input(s): PROBNP in the last 8760 hours.   D-Dimer No results for input(s): DDIMER in the last 72 hours. Hemoglobin A1C Recent Labs    01/14/22 0400  HGBA1C 5.2    Fasting Lipid Panel No results for input(s): CHOL, HDL, LDLCALC, TRIG, CHOLHDL, LDLDIRECT in the last 72 hours. Thyroid Function Tests No results for input(s): TSH, T4TOTAL, T3FREE, THYROIDAB in the last 72 hours.  Invalid input(s): FREET3   Other results:   Imaging    DG Abd Portable 1V  Result  Date: 01/14/2022 CLINICAL DATA:  Encounter for feeding tube placement EXAM: PORTABLE ABDOMEN - 1 VIEW COMPARISON:  01/23/2022 FINDINGS: Interval removal of orogastric tube and placement of large bore feeding tube. The feeding tube extends below the diaphragm with distal tip terminating within the mid pelvis, presumably within the distal stomach. This positioning is similar to the previously removed orogastric tube. Nonobstructive bowel gas pattern. IMPRESSION: Interval placement of large bore feeding tube with distal tip terminating within the mid pelvis, presumably within the distal stomach. Electronically Signed   By: Davina Poke D.O.   On: 01/14/2022 10:22     Medications:     Scheduled Medications:  arformoterol  15 mcg Nebulization BID   budesonide (PULMICORT) nebulizer solution  0.25 mg Nebulization BID   chlorhexidine  15 mL Mouth Rinse BID   Chlorhexidine Gluconate Cloth  6 each Topical Daily   colestipol  1 g Per Tube BID   ezetimibe  10 mg Per Tube Daily   fluticasone  1 spray Each Nare BID   hydroxychloroquine  400 mg Per Tube QHS   insulin aspart  0-9 Units Subcutaneous Q4H   levothyroxine  25 mcg Per Tube QAC breakfast   loratadine  10 mg Per Tube Daily   mouth rinse  15 mL Mouth Rinse q12n4p   montelukast  10 mg Per Tube Daily   multivitamin with minerals  1 tablet Per Tube Daily   pantoprazole (PROTONIX) IV  40 mg Intravenous QHS   revefenacin  175 mcg Nebulization Daily   sodium chloride flush  10-40 mL Intracatheter Q12H   sodium chloride flush  3 mL Intravenous Q12H   sodium chloride flush  3 mL Intravenous Q12H    Infusions:  sodium chloride 250 mL (01/14/22 0518)   sodium chloride     amiodarone 60 mg/hr (01/15/22 0600)   feeding supplement (VITAL 1.5 CAL) 50 mL/hr at 01/14/22 2000   heparin 1,300 Units/hr (01/15/22 0600)   milrinone 0.125 mcg/kg/min (01/15/22 0600)   norepinephrine (LEVOPHED) Adult infusion 13 mcg/min (01/15/22 0600)    PRN  Medications: sodium chloride, sodium chloride, acetaminophen, ALPRAZolam, guaiFENesin-dextromethorphan, hydrALAZINE, ipratropium, labetalol, levalbuterol, melatonin, ondansetron (ZOFRAN) IV, phenol, sodium chloride flush, sodium chloride flush, sodium chloride flush, traZODone    Patient Profile   [name] is a 81 year old with a history of PAF, moderate AS, severe MR, COPD, quit smoking  25 years ago, PAD, HTN, hyperlipidemia, CVA, NICM, tobacco abuse, ischemic bowel with resection in 2017,11/04/22 left ureteral calculus S/P utreteroscopy and stent,  and chronic HFrEF.     Admitted A/C HFrEF and A fib RVR.   Assessment/Plan   A/C HFrEF - Echo this admit EF down to 25% from previous Echo EF 40-45% in 2018 .  BNP on admit > 2800.  - Has LBBB, may eventually benefit from CRT depending on progress - Cath 2/2 with minimal CAD preserved cardiac output, elevated PCWP (intermittent v-waves up to 50) suggestive of severe MR.  - TEE 2/3 LVEF 15% RV severe HK. Severe MR - S/p mitral valve repair with mitraclip 02/09 - Echo 2/10 EF < 20% significant LV dyssynchrony due to LBBB. RV moderately decreased MV clip ok. Mild to mod MR. Personally reviewed - on Milrinone 0.125 + 13 NE. Co-ox 72%. Lauren work to wean NE today. Keep MAP > 65 < 80 - CVP 6-8 - I do not think she is markedly volume overloaded. Labs c/w contraction alkalosis - Lauren hold lasix for now as respiratory status stable. Given hyponatremia Lauren give tolvaptan. Also low-dose diamox   - GDMT limited by need for NE - Echo with significant LV dyssynchrony. Suspect LBBB may be playing significant role here.    2. Severe MR  - 12/28/21 TEE with severe MR - s/p mitraclip A2/P2 02/09 - TEE post procedure with mild to moderate residual MR - Echo 2/10 EF < 20% significant LV dyssynchrony due to LBBB. RV moderately decreased MV clip ok. Mild to mod MR. Personally reviewed   3. Persistent AF - Last dose of eliquis 12/22/2021 (morning) -> heparin -  s/p DC-CV 2/3 - Back in Afib, V-rates 90-100s - Continue amio at 60/hr while on milrinone - Plan repeat DC-CV this week    4. AKI  - Recently had diuretics increased on 1/5. Creatinine on that time was 1.2.  - On admit creatinine was 1.7 - SCr 1.5 today. Follow daily   5. A/C Respiratory Failure - On exam has severe underlying COPD - On triple inhalers and recently placed on oxygen 12/17/21.  - stable today. Repeat CXR in am    6. Anemia  - Hgb 10.7. Iron Sats 7%.  - Received feraheme 02/03  7. Hypokalemia/hypomagnesemia - supp as needed with diuresis - K 3.7 and mag 1.7 today  8. Severe protein calorie malnutrition - h/o embolic event to colon in 2017 requiring emergency R [facility]lectomy at [facility] followed by colostomy and then takedown - Prealbumin 11.1 - Cor-trak placed 2/6 for nighttime feeds.Continue TFs  9. Severe deconditioning - PT/OT following  10. Hyponatremia - give tolvaptan - limit FW  CRITICAL CARE Performed by: Glori Bickers  Total critical care time: 45 minutes  Critical care time was exclusive of separately billable procedures and treating other patients.  Critical care was necessary to treat or prevent imminent or life-threatening deterioration.  Critical care was time spent personally by me (independent of midlevel providers or residents) on the following activities: development of treatment plan with patient and/or surrogate as well as nursing, discussions with consultants, evaluation of patient's response to treatment, examination of patient, obtaining history from patient or surrogate, ordering and performing treatments and interventions, ordering and review of laboratory studies, ordering and review of radiographic studies, pulse oximetry and re-evaluation of patient's condition.   Length of Stay: [place], MD  01/15/2022, 9:55 AM  Advanced Heart Failure Team Pager [phone] (M-F; 7a -  5p)  Please contact [facility] for  night-coverage after hours (5p -7a ) and weekends on [email or web address]

## 2022-01-15 NOTE — Progress Notes (Signed)
K+ 3.5  Replaced per protocol  

## 2022-01-15 NOTE — Plan of Care (Signed)

## 2022-01-15 NOTE — Progress Notes (Signed)
ANTICOAGULATION CONSULT NOTE - Follow Up Consult  Pharmacy Consult for heparin Indication: atrial fibrillation  Labs: Recent Labs    01/10/2022 0000 01/14/2022 0500 01/16/2022 1448 01/27/2022 2051 01/14/22 0400 01/15/22 0434 01/15/22 0743 01/15/22 0950  HGB 10.4* 10.4*   < > 12.2 11.7* 10.7*  --   --   HCT 33.4* 33.2*   < > 36.0 36.4 34.3*  --   --   PLT 175 176  --   --  211 169  --   --   LABPROT 14.1  --   --   --   --   --   --   --   INR 1.1  --   --   --   --   --   --   --   HEPARINUNFRC  --  0.32  --   --  0.35  --  0.35  --   CREATININE 1.34* 1.39*  --   --  1.49* 1.51*  --  1.58*   < > = values in this interval not displayed.    Assessment: 81yo female who was on apix PTA 2.5mg  BID for a fib that was left off med rec and not continued on admit to ED on 1/30. Patient underwent TEE/DCCV 2/3 now s/p MitraClip 2/9.   Heparin level this morning is therapeutic on 1300 units/hr. No infusion issues or s/sx of bleeding noted. H/H and pltc stable.  Goal of Therapy:  Heparin level 0.3-0.7 units/ml   Plan:  Continue IV heparin to 1300 units/hr. Daily heparin level and CBC while on heparin  Arrie Senate, PharmD, [place], [facility] Clinical Pharmacist [phone] Please check AMION for all [place] numbers 01/15/2022

## 2022-01-15 NOTE — Progress Notes (Signed)
Structural HD Progress Note  Patient Name: [name] Date of Encounter: 01/15/2022  [facility] Cardiologist: Will Meredith Leeds, MD   Subjective   Extubated yesterday.  Remains on milrinone/norepi/heparin/amio.  Co-ox>70, I/O +1285, Cr 1.5  TTE yesterday with severe LV dysfunction, mild to moderate MR, and dilated IVC/hepatic veins  Patient has no complaints and is breathing well.  She does report diarrhea but this is a chronic issue.  Inpatient Medications    Scheduled Meds:  arformoterol  15 mcg Nebulization BID   budesonide (PULMICORT) nebulizer solution  0.25 mg Nebulization BID   chlorhexidine  15 mL Mouth Rinse BID   Chlorhexidine Gluconate Cloth  6 each Topical Daily   colestipol  1 g Per Tube BID   docusate  100 mg Per Tube BID   ezetimibe  10 mg Per Tube Daily   fluticasone  1 spray Each Nare BID   hydroxychloroquine  400 mg Per Tube QHS   insulin aspart  0-9 Units Subcutaneous Q4H   levothyroxine  25 mcg Per Tube QAC breakfast   loratadine  10 mg Per Tube Daily   mouth rinse  15 mL Mouth Rinse q12n4p   montelukast  10 mg Per Tube Daily   multivitamin with minerals  1 tablet Per Tube Daily   pantoprazole (PROTONIX) IV  40 mg Intravenous QHS   polyethylene glycol  17 g Per Tube Daily   potassium chloride  40 mEq Per Tube Once   revefenacin  175 mcg Nebulization Daily   sodium chloride flush  10-40 mL Intracatheter Q12H   sodium chloride flush  3 mL Intravenous Q12H   sodium chloride flush  3 mL Intravenous Q12H   Continuous Infusions:  sodium chloride 250 mL (01/14/22 0518)   sodium chloride     amiodarone 60 mg/hr (01/15/22 0600)   feeding supplement (VITAL 1.5 CAL) 50 mL/hr at 01/14/22 2000   heparin 1,300 Units/hr (01/15/22 0600)   milrinone 0.125 mcg/kg/min (01/15/22 0600)   norepinephrine (LEVOPHED) Adult infusion 13 mcg/min (01/15/22 0600)   PRN Meds: sodium chloride, sodium chloride, acetaminophen, ALPRAZolam,  guaiFENesin-dextromethorphan, hydrALAZINE, ipratropium, labetalol, levalbuterol, melatonin, ondansetron (ZOFRAN) IV, phenol, sodium chloride flush, sodium chloride flush, sodium chloride flush, traZODone   Vital Signs    Vitals:   01/15/22 0515 01/15/22 0530 01/15/22 0545 01/15/22 0600  BP:      Pulse: (!) 102 98 94 95  Resp: 19 19 16 19   Temp:      TempSrc:      SpO2: 93% 95% 97% 94%  Weight:      Height:        Intake/Output Summary (Last 24 hours) at 01/15/2022 0803 Last data filed at 01/15/2022 0600 Gross per 24 hour  Intake 2735.75 ml  Output 1365 ml  Net 1370.75 ml   Last 3 Weights 01/15/2022 01/14/2022 01/10/2022  Weight (lbs) 122 lb 9.2 oz 118 lb 9.7 oz 119 lb 11.4 oz  Weight (kg) 55.6 kg 53.8 kg 54.3 kg      Telemetry    AF- Personally Reviewed  ECG    N/A  Physical Exam   GEN: No acute distress.   Neck: No JVD  NGT in place Cardiac: Irregular RR, no murmurs, rubs, or gallops.  Respiratory: Clear to auscultation bilaterally. GI: Soft, nontender, non-distended  Lauren: No edema; No deformity; warm and well-perfused Neuro:  Nonfocal  Psych: Normal affect   Labs    High Sensitivity Troponin:   Recent Labs  Lab 12/18/2021  1913 12/18/2021 2113  TROPONINIHS 13 13     Chemistry Recent Labs  Lab 01/14/2022 0500 01/28/2022 1448 01/28/2022 2051 01/14/22 0400 01/15/22 0434  NA 129*   < > 128* 129* 121*  K 4.0   < > 3.7 3.7 3.5  CL 85*  --   --  85* 76*  CO2 31  --   --  31 33*  GLUCOSE 275*  --   --  124* 356*  BUN 18  --   --  17 21  CREATININE 1.39*  --   --  1.49* 1.51*  CALCIUM 8.7*  --   --  9.1 8.2*  MG 1.9  --   --  1.7 2.1  GFRNONAA 38*  --   --  35* 34*  ANIONGAP 13  --   --  13 12   < > = values in this interval not displayed.    Lipids No results for input(s): CHOL, TRIG, HDL, LABVLDL, LDLCALC, CHOLHDL in the last 168 hours.  Hematology Recent Labs  Lab 01/30/2022 0500 01/24/2022 1448 01/10/2022 2051 01/14/22 0400 01/15/22 0434  WBC 7.1  --   --   8.9 9.6  RBC 3.91  --   --  4.39 4.09  HGB 10.4*   < > 12.2 11.7* 10.7*  HCT 33.2*   < > 36.0 36.4 34.3*  MCV 84.9  --   --  82.9 83.9  MCH 26.6  --   --  26.7 26.2  MCHC 31.3  --   --  32.1 31.2  RDW 18.3*  --   --  18.1* 17.7*  PLT 176  --   --  211 169   < > = values in this interval not displayed.   Thyroid No results for input(s): TSH, FREET4 in the last 168 hours.  BNPNo results for input(s): BNP, PROBNP in the last 168 hours.  DDimer No results for input(s): DDIMER in the last 168 hours.    Cardiac Studies   TTE personally reviewed with salient findings detailed above.  Patient Profile     [name] is a 81 year old with a history of PAF, moderate AS, severe MR, COPD, quit smoking 25 years ago, PAD, HTN, hyperlipidemia, CVA, NICM, tobacco abuse, ischemic bowel with resection in 2017,11/04/22 left ureteral calculus S/P utreteroscopy and stent,  and chronic HFrEF.   Assessment & Plan     Acute on chronic systolic heart failure/cardiogenic shock: The patient continues to improve.  Based on my review of her echocardiogram she has a significant amount of fluid still on board.  Per her cumulative I's and O's she is up 5.2 L in around 2 kg in weight.  Her co-ox is reasonable.  Her arterial invasive pressure shows a MAP of close to 88.  She is on norepinephrine.  Medical therapy as per heart failure team and hopefully aggressive diuresis can be achieved. AF: Continue heparin and amiodarone drips, will benefit from cardioversion after more euvolemic.   AKI: Creatinine relatively stable; diuresis per heart failure team Severe MR: Technically successful result;  not need for plavix or ASA, cont hep gtt and will need A/C when able to take PO Malnutrition:  Cont aggressive nutritional support Respiratory failure: Resolved, remains extubated and comfortable. Elevated blood sugar:  Per CCM, may require insulin drip Diarrhea: Monitor for infection White blood cell count not elevated and  patient is afebrile.     For questions or updates, please contact [place] Please consult  [email or web address] for contact info under        Signed, Early Osmond, MD  01/15/2022, 8:03 AM

## 2022-01-16 ENCOUNTER — Inpatient Hospital Stay (HOSPITAL_COMMUNITY): Payer: Medicare Other

## 2022-01-16 DIAGNOSIS — N179 Acute kidney failure, unspecified: Secondary | ICD-10-CM | POA: Diagnosis not present

## 2022-01-16 DIAGNOSIS — I5043 Acute on chronic combined systolic (congestive) and diastolic (congestive) heart failure: Secondary | ICD-10-CM | POA: Diagnosis not present

## 2022-01-16 DIAGNOSIS — I959 Hypotension, unspecified: Secondary | ICD-10-CM | POA: Diagnosis not present

## 2022-01-16 LAB — IRON AND TIBC
Iron: 35 ug/dL (ref 28–170)
Saturation Ratios: 16 % (ref 10.4–31.8)
TIBC: 216 ug/dL — ABNORMAL LOW (ref 250–450)
UIBC: 181 ug/dL

## 2022-01-16 LAB — CBC WITH DIFFERENTIAL/PLATELET
Abs Immature Granulocytes: 0.05 10*3/uL (ref 0.00–0.07)
Basophils Absolute: 0 10*3/uL (ref 0.0–0.1)
Basophils Relative: 0 %
Eosinophils Absolute: 0.1 10*3/uL (ref 0.0–0.5)
Eosinophils Relative: 2 %
HCT: 31.6 % — ABNORMAL LOW (ref 36.0–46.0)
Hemoglobin: 9.9 g/dL — ABNORMAL LOW (ref 12.0–15.0)
Immature Granulocytes: 1 %
Lymphocytes Relative: 8 %
Lymphs Abs: 0.6 10*3/uL — ABNORMAL LOW (ref 0.7–4.0)
MCH: 27 pg (ref 26.0–34.0)
MCHC: 31.3 g/dL (ref 30.0–36.0)
MCV: 86.3 fL (ref 80.0–100.0)
Monocytes Absolute: 1.5 10*3/uL — ABNORMAL HIGH (ref 0.1–1.0)
Monocytes Relative: 19 %
Neutro Abs: 5.8 10*3/uL (ref 1.7–7.7)
Neutrophils Relative %: 70 %
Platelets: 102 10*3/uL — ABNORMAL LOW (ref 150–400)
RBC: 3.66 MIL/uL — ABNORMAL LOW (ref 3.87–5.11)
RDW: 17.9 % — ABNORMAL HIGH (ref 11.5–15.5)
WBC: 8.1 10*3/uL (ref 4.0–10.5)
nRBC: 0 % (ref 0.0–0.2)

## 2022-01-16 LAB — PROCALCITONIN: Procalcitonin: <0.1 ng/mL

## 2022-01-16 LAB — HEPARIN LEVEL (UNFRACTIONATED): Heparin Unfractionated: 0.34 IU/mL (ref 0.30–0.70)

## 2022-01-16 LAB — SEDIMENTATION RATE: Sed Rate: 57 mm/hr — ABNORMAL HIGH (ref 0–22)

## 2022-01-16 LAB — BASIC METABOLIC PANEL
Anion gap: 10 (ref 5–15)
Anion gap: 10 (ref 5–15)
BUN: 19 mg/dL (ref 8–23)
BUN: 19 mg/dL (ref 8–23)
CO2: 30 mmol/L (ref 22–32)
CO2: 32 mmol/L (ref 22–32)
Calcium: 8.1 mg/dL — ABNORMAL LOW (ref 8.9–10.3)
Calcium: 8.7 mg/dL — ABNORMAL LOW (ref 8.9–10.3)
Chloride: 79 mmol/L — ABNORMAL LOW (ref 98–111)
Chloride: 84 mmol/L — ABNORMAL LOW (ref 98–111)
Creatinine, Ser: 1.51 mg/dL — ABNORMAL HIGH (ref 0.44–1.00)
Creatinine, Ser: 1.64 mg/dL — ABNORMAL HIGH (ref 0.44–1.00)
GFR, Estimated: 34 mL/min — ABNORMAL LOW (ref >60)
GFR, Estimated: 31 mL/min — ABNORMAL LOW (ref >60)
Glucose, Bld: 395 mg/dL — ABNORMAL HIGH (ref 70–99)
Glucose, Bld: 134 mg/dL — ABNORMAL HIGH (ref 70–99)
Potassium: 4 mmol/L (ref 3.5–5.1)
Potassium: 4.6 mmol/L (ref 3.5–5.1)
Sodium: 119 mmol/L — CL (ref 135–145)
Sodium: 126 mmol/L — ABNORMAL LOW (ref 135–145)

## 2022-01-16 LAB — COOXEMETRY PANEL
Carboxyhemoglobin: 1.2 % (ref 0.5–1.5)
Methemoglobin: 1 % (ref 0.0–1.5)
O2 Saturation: 74.2 %
Total hemoglobin: 10.6 g/dL — ABNORMAL LOW (ref 12.0–16.0)

## 2022-01-16 LAB — C-REACTIVE PROTEIN: CRP: 8.4 mg/dL — ABNORMAL HIGH (ref <1.0)

## 2022-01-16 LAB — HEPATIC FUNCTION PANEL
ALT: 9 U/L (ref 0–44)
AST: 14 U/L — ABNORMAL LOW (ref 15–41)
Albumin: 2.3 g/dL — ABNORMAL LOW (ref 3.5–5.0)
Alkaline Phosphatase: 68 U/L (ref 38–126)
Bilirubin, Direct: 0.2 mg/dL (ref 0.0–0.2)
Indirect Bilirubin: 0.2 mg/dL — ABNORMAL LOW (ref 0.3–0.9)
Total Bilirubin: 0.4 mg/dL (ref 0.3–1.2)
Total Protein: 5.1 g/dL — ABNORMAL LOW (ref 6.5–8.1)

## 2022-01-16 LAB — GLUCOSE, CAPILLARY
Glucose-Capillary: 125 mg/dL — ABNORMAL HIGH (ref 70–99)
Glucose-Capillary: 140 mg/dL — ABNORMAL HIGH (ref 70–99)
Glucose-Capillary: 151 mg/dL — ABNORMAL HIGH (ref 70–99)
Glucose-Capillary: 130 mg/dL — ABNORMAL HIGH (ref 70–99)
Glucose-Capillary: 155 mg/dL — ABNORMAL HIGH (ref 70–99)

## 2022-01-16 LAB — MAGNESIUM: Magnesium: 2.1 mg/dL (ref 1.7–2.4)

## 2022-01-16 LAB — FERRITIN: Ferritin: 377 ng/mL — ABNORMAL HIGH (ref 11–307)

## 2022-01-16 LAB — VITAMIN B12: Vitamin B-12: 783 pg/mL (ref 180–914)

## 2022-01-16 MED ORDER — STERILE WATER FOR INJECTION IV SOLN
INTRAVENOUS | Status: AC
  Filled 2022-01-16: qty 341.6

## 2022-01-16 MED ORDER — SODIUM CHLORIDE 0.9 % IV SOLN
2.0000 g | INTRAVENOUS | Status: DC
  Administered 2022-01-16 – 2022-01-19 (×4): 2 g via INTRAVENOUS
  Filled 2022-01-16 (×4): qty 2

## 2022-01-16 MED ORDER — MEGESTROL ACETATE 40 MG PO TABS
40.0000 mg | ORAL_TABLET | Freq: Every day | ORAL | Status: DC
  Administered 2022-01-16 – 2022-01-17 (×2): 40 mg via ORAL
  Filled 2022-01-16 (×3): qty 1

## 2022-01-16 MED ORDER — STERILE WATER FOR INJECTION IV SOLN: INTRAVENOUS | Status: DC

## 2022-01-16 NOTE — Progress Notes (Signed)
NAME:  KAILEN NAME, MRN:  [id number], DOB:  04/14/1941, LOS: 29 ADMISSION DATE:  12/12/2021, CONSULTATION DATE:  01/12/2022 REFERRING MD:  , CHIEF COMPLAINT:  Respiratory Failure   History of Present Illness:  81 year old woman with hx of NICM, mitral failure, SSS, afib on AC, COPD who presented with decompensated heart failure.  After volume and inotrope optimization she underwent mitraclip 2/9 and remains intubated post procedure.  PCCM consulted for further management.  Pertinent  Medical History   Past Medical History:  Diagnosis Date   Allergic rhinitis    Anxiety    Atrial septal aneurysm    Basal cell carcinoma    Bradycardia    Cataract    bil cataracts removed   Chronic combined systolic and diastolic CHF (congestive heart failure) (HCC)    CKD (chronic kidney disease), stage III (HCC)    Clostridium difficile infection    COPD (chronic obstructive pulmonary disease) ([place])    Depression 11/29/2013   DI (detrusor instability)    Esophageal stricture    Hemorrhoids    Hiatal hernia    History of kidney stones    HLD (hyperlipidemia)    HTN (hypertension)    Ischemic colon ([place])    a. 08/2016 - adm to  [facility] with a ruptured colon s/p right hemicolectomy with ileostomy formation for ischemic and necrotic colon.    Lung nodule    a. followed by pulmonology   Mild CAD    a. minor nonobstructive CAD 08/22/16.   Mitral regurgitation    Nephrolithiasis    hx   NICM (nonischemic cardiomyopathy) (HCC)    Orthostatic hypotension    Osteoarthritis    PAF (paroxysmal atrial fibrillation) ([place])    a. diagnosed 07/2016 s/p DCCV 08/01/16 with recurrence, managed with rate control for now.    Pneumonia 2013   PVC's (premature ventricular contractions)    S/P mitral valve clip implantation 01/25/2022   MitraClip NTW clip was positioned A2 and P2 leaflets with Dr. Burt Knack and Dr. Ali Lowe   Shingles since Nov 18, 2013   on right arm from shoulder to wrist   Status post  dilation of esophageal narrowing    Stroke [facility]) 2001   stroke affected left leg   Syncope    a. 08/2016 felt due to orthostatic hypotension.   TIA (transient ischemic attack) last 2001   anticoagulation therapy on coumadin     Significant Hospital Events: Including procedures, antibiotic start and stop dates in addition to other pertinent events   mitraclip 2/9 TEE 2/9 2/10 extubated  Interim History / Subjective:  Complains of diarrhea.  Still dyspneic.   Objective   Blood pressure (!) 88/62, pulse 100, temperature 98 F (36.7 C), temperature source Axillary, resp. rate 19, height 5\' 4"  (1.626 m), weight 55.6 kg, SpO2 100 %. CVP:  [0 mmHg-39 mmHg] 33 mmHg      Intake/Output Summary (Last 24 hours) at 01/16/2022 1136 Last data filed at 01/16/2022 1000 Gross per 24 hour  Intake 2891.59 ml  Output 1665 ml  Net 1226.59 ml    Filed Weights   01/14/22 0500 01/15/22 0500 01/16/22 0500  Weight: 53.8 kg 55.6 kg 55.6 kg    Examination: General: Ill appearing woman in bed. Sitting in bed. Cachectic.  HENT: Cortrak tube in place.  Lungs: Occasional scattered crackles.  Bronchial at both bases and over right upper lobe.  Cardiovascular: 3/6 aortic murmur and 2/6 mitral murmur. LV heave.  JVP at 2cm  Abdomen: Active bowel sounds Extremities: +2 pulses without edema Neuro: Generalized weakness.   Ancillary tests personally reviewed:   Echo shows low EF. Biventricular failure.  Hyponatremia to 468 Metabolic Alkalosis.  BNP down by half from admission.  CXR 2/12 shows very distinct RUL infiltrate and increased background interstitial marking which are new since admission.   Assessment & Plan:   Critically ill due to cardiogenic shock due to decompensated HFrEF from NICM Severe MR now status post mitral clip.  Severe Malnutrition with cachexia related to HF Persistent AF Chronic respiratory insufficiency due to COPD Hyponatremia due to volume contraction.   Chronic diarrhea  status post partial colectomy.  GERD with prior peptic stricture.   Plan:  - Progressive ambulation - Wean O2 to keep sat > 92% - Wean NE to keep MAP >65 - Continue milrinone at current dose - Decrease amiodarone to 30. - Colestid and immodium for diarrhea.  - Start HCAP coverage for suspected aspiration event.  - Post pyloric feeding tube - Start TPN as patient is severely malnourished and may have malabsorption issues.  - Chronic diarrhea workup sent.   Best Practice (right click and "Reselect all SmartList Selections" daily)   Diet/type: continue tube feeds and introduce oral intake DVT prophylaxis: systemic heparin GI prophylaxis: N/A Lines: yes and it is still needed Foley:  N/A Code Status:  full code Last date of multidisciplinary goals of care discussion [patient and husband updated at the bedside.]  CRITICAL CARE Performed by: Kipp Brood   Total critical care time: 35 minutes  Critical care time was exclusive of separately billable procedures and treating other patients.  Critical care was necessary to treat or prevent imminent or life-threatening deterioration.  Critical care was time spent personally by me on the following activities: development of treatment plan with patient and/or surrogate as well as nursing, discussions with consultants, evaluation of patient's response to treatment, examination of patient, obtaining history from patient or surrogate, ordering and performing treatments and interventions, ordering and review of laboratory studies, ordering and review of radiographic studies, pulse oximetry, re-evaluation of patient's condition and participation in multidisciplinary rounds.  Kipp Brood, MD [facility] ICU Physician [place]  Pager: [phone] Mobile: [phone] After hours: [phone].   01/16/2022, 11:36 AM

## 2022-01-16 NOTE — Progress Notes (Signed)
Advanced Heart Failure Rounding Note  PCP-Cardiologist: Will Meredith Leeds, MD   Subjective:    2/1 A fib RVR--> Started on amio drip + heparin drip. PICC placed. Diuretics held.  2/2 RHC/LHC - Had cath with preserved cardiac output, min nonob CAD, elevated PCWP suggestive of severe MR.  2/4 TEE/DC-CV  LVEF 15% RV severely HK. DC-CV to NSR 2/7 Milrinone added for low co-ox (50%). 2/09 s/p Mitraclip A2/P2 2/10 Echo EF < 20% RV moderately decreased MV clip ok. Mild to mod MR.   Remains on milrinone 0.125 + NE 8. SBP was in 70s earlier now 110-120   Remains nauseated. Unable to tolerate TFs.   Feels weak. No SOB.   Remains in AF on IV amio.   CXR with RUL PNA. CVP 6 Co-ox 74% NA 126  SCr 1.5->1.6   Objective:   Weight Range: 55.6 kg Body mass index is 21.04 kg/m.   Vital Signs:   Temp:  [97.5 F (36.4 C)-98.1 F (36.7 C)] 98 F (36.7 C) (02/12 1108) Pulse Rate:  [94-113] 100 (02/12 0815) Resp:  [11-32] 19 (02/12 0815) BP: (82-100)/(57-62) 88/62 (02/12 0800) SpO2:  [83 %-100 %] 100 % (02/12 0815) Arterial Line BP: (86-141)/(44-70) 134/61 (02/12 0747) Weight:  [55.6 kg] 55.6 kg (02/12 0500) Last BM Date: 01/15/22  Weight change: Filed Weights   01/14/22 0500 01/15/22 0500 01/16/22 0500  Weight: 53.8 kg 55.6 kg 55.6 kg    Intake/Output:   Intake/Output Summary (Last 24 hours) at 01/16/2022 1110 Last data filed at 01/16/2022 1000 Gross per 24 hour  Intake 2891.59 ml  Output 1665 ml  Net 1226.59 ml       Physical Exam   General:  Elderly cachetic No resp difficulty HEENT: normal + coretrak Neck: supple. no JVD. Carotids 2+ bilat; no bruits. No lymphadenopathy or thryomegaly appreciated. Cor: Irregular rate & rhythm. Lungs: RUL crackles Abdomen: soft, nontender, nondistended. No hepatosplenomegaly. No bruits or masses. Good bowel sounds. Extremities: no cyanosis, clubbing, rash, edema Neuro: alert & orientedx3, cranial nerves grossly intact. moves all  4 extremities w/o difficulty. Affect pleasant    Telemetry   AF 90-100, rare PVCs Personally reviewed   Labs    CBC Recent Labs    01/15/22 0434 01/16/22 0447  WBC 9.6 8.1  NEUTROABS 6.4 5.8  HGB 10.7* 9.9*  HCT 34.3* 31.6*  MCV 83.9 86.3  PLT 169 102*    Basic Metabolic Panel Recent Labs    01/15/22 0434 01/15/22 0950 01/16/22 0447 01/16/22 0600  NA 121*   < > 119* 126*  K 3.5   < > 4.0 4.6  CL 76*   < > 79* 84*  CO2 33*   < > 30 32  GLUCOSE 356*   < > 395* 134*  BUN 21   < > 19 19  CREATININE 1.51*   < > 1.51* 1.64*  CALCIUM 8.2*   < > 8.1* 8.7*  MG 2.1  --  2.1  --    < > = values in this interval not displayed.    Liver Function Tests Recent Labs    01/15/22 0950  AST 16  ALT 10  ALKPHOS 82  BILITOT 0.4  PROT 5.3*  ALBUMIN 2.5*    No results for input(s): LIPASE, AMYLASE in the last 72 hours. Cardiac Enzymes No results for input(s): CKTOTAL, CKMB, CKMBINDEX, TROPONINI in the last 72 hours.  BNP: BNP (last 3 results) Recent Labs    12/24/2021 1737 01/15/22  0950  BNP 2,812.3* 1,297.4*     ProBNP (last 3 results) No results for input(s): PROBNP in the last 8760 hours.   D-Dimer No results for input(s): DDIMER in the last 72 hours. Hemoglobin A1C Recent Labs    01/14/22 0400  HGBA1C 5.2    Fasting Lipid Panel No results for input(s): CHOL, HDL, LDLCALC, TRIG, CHOLHDL, LDLDIRECT in the last 72 hours. Thyroid Function Tests No results for input(s): TSH, T4TOTAL, T3FREE, THYROIDAB in the last 72 hours.  Invalid input(s): FREET3   Other results:   Imaging    DG CHEST PORT 1 VIEW  Result Date: 01/16/2022 CLINICAL DATA:  81 year old female with history of acute respiratory failure with hypoxia. EXAM: PORTABLE CHEST 1 VIEW COMPARISON:  Chest x-ray 01/09/2022. FINDINGS: Patient has been extubated. A feeding tube is seen extending into the abdomen, however, the tip of the feeding tube extends below the lower margin of the image.  Patchy multifocal airspace consolidation again noted, most severe in the right upper lobe and left lower lobe. Probable bibasilar areas of subsegmental atelectasis also noted. Small right and moderate left pleural effusions. No pneumothorax. Diffuse interstitial prominence and peribronchial cuffing. Heart size is mildly enlarged. Upper mediastinal contours are within normal limits. Atherosclerotic calcifications are noted in the thoracic aorta. Metallic density projecting over the heart, potentially a MitraClip. IMPRESSION: 1. Support apparatus, as above. 2. Severe bronchitis with multilobar bilateral bronchopneumonia, similar to the prior study. 3. Small right and moderate left pleural effusions. 4. Aortic atherosclerosis. Electronically Signed   By: Vinnie Langton M.D.   On: 01/16/2022 08:11     Medications:     Scheduled Medications:  acetaZOLAMIDE  250 mg Oral BID   arformoterol  15 mcg Nebulization BID   budesonide (PULMICORT) nebulizer solution  0.25 mg Nebulization BID   chlorhexidine  15 mL Mouth Rinse BID   Chlorhexidine Gluconate Cloth  6 each Topical Daily   colestipol  1 g Per Tube BID   ezetimibe  10 mg Per Tube Daily   fluticasone  1 spray Each Nare BID   hydroxychloroquine  400 mg Per Tube QHS   insulin aspart  0-9 Units Subcutaneous Q4H   levothyroxine  25 mcg Per Tube QAC breakfast   loratadine  10 mg Per Tube Daily   mouth rinse  15 mL Mouth Rinse q12n4p   montelukast  10 mg Per Tube Daily   multivitamin with minerals  1 tablet Per Tube Daily   pantoprazole (PROTONIX) IV  40 mg Intravenous QHS   revefenacin  175 mcg Nebulization Daily   sodium chloride flush  10-40 mL Intracatheter Q12H   sodium chloride flush  3 mL Intravenous Q12H   sodium chloride flush  3 mL Intravenous Q12H    Infusions:  sodium chloride Stopped (01/14/22 0519)   sodium chloride     amiodarone 60 mg/hr (01/16/22 1040)   feeding supplement (VITAL 1.5 CAL) 1,000 mL (01/15/22 1537)   heparin  1,300 Units/hr (01/16/22 1000)   milrinone 0.125 mcg/kg/min (01/16/22 1000)   norepinephrine (LEVOPHED) Adult infusion 8 mcg/min (01/16/22 1000)    PRN Medications: sodium chloride, sodium chloride, acetaminophen, ALPRAZolam, guaiFENesin-dextromethorphan, hydrALAZINE, ipratropium, labetalol, levalbuterol, loperamide, melatonin, ondansetron (ZOFRAN) IV, phenol, sodium chloride flush, sodium chloride flush, sodium chloride flush, traZODone    Patient Profile   [name] is a 81 year old with a history of PAF, moderate AS, severe MR, COPD, quit smoking 25 years ago, PAD, HTN, hyperlipidemia, CVA, NICM, tobacco abuse, ischemic bowel  with resection in 2017,11/04/22 left ureteral calculus S/P utreteroscopy and stent,  and chronic HFrEF.     Admitted A/C HFrEF and A fib RVR.   Assessment/Plan   A/C HFrEF - Echo this admit EF down to 25% from previous Echo EF 40-45% in 2018 .  BNP on admit > 2800.  - Has LBBB, may eventually benefit from CRT depending on progress - Cath 2/2 with minimal CAD preserved cardiac output, elevated PCWP (intermittent v-waves up to 50) suggestive of severe MR.  - TEE 2/3 LVEF 15% RV severe HK. Severe MR - S/p mitral valve repair with mitraclip 02/09 - Echo 2/10 EF < 20% significant LV dyssynchrony due to LBBB. RV moderately decreased MV clip ok. Mild to mod MR. Personally reviewed - on Milrinone 0.125 + 8 NE. Co-ox 74%. Continue to wean NE Keep MAP > 65 < 80 - CVP 6 - I think she is dry. Continue to hold lasix.  - Start abx for RUL PNA - GDMT limited by need for NE - Echo with significant LV dyssynchrony. Suspect LBBB may be playing significant role here.    2. Severe MR  - 12/28/21 TEE with severe MR - s/p mitraclip A2/P2 02/09 - TEE post procedure with mild to moderate residual MR - Echo 2/10 EF < 20% significant LV dyssynchrony due to LBBB. RV moderately decreased MV clip ok. Mild to mod MR. Personally reviewed   3. Persistent AF - Last dose of eliquis  12/13/2021 (morning) -> heparin - s/p DC-CV 2/3 - Back in Afib, V-rates 90-100s - Continue amio while on milrinone. Can decrease amio to 30  - Plan repeat DC-CV this week as able   4. AKI  - Recently had diuretics increased on 1/5. Creatinine on that time was 1.2.  - On admit creatinine was 1.7 - SCr 1.5-> 1.6 today. Hold diuretics   5. A/C Respiratory Failure with RUL PNA - On exam has severe underlying COPD - Volume status ok/low.  - CXR c.w RUL PNA. Check PCT. Start abx. D/w CCM at bedisdie   6. Anemia  - Hgb 9.9. Iron Sats 7%.  - Received feraheme 02/03  7. Hypokalemia/hypomagnesemia - supp as needed  8. Severe protein calorie malnutrition - h/o embolic event to colon in 2017 requiring emergency R hemi-colectomy at [facility] followed by colostomy and then takedown - Prealbumin 11.1 - Cor-trak placed 2/6 for nighttime feeds. - Unable to tolerate TFs due to n/v. Cannot get Reglan with diarrhea - Start TPN today. Will ask Nutrition to move cor-trak post-pyloric  9. Severe deconditioning - PT/OT following  10. Hyponatremia - Na 126. Rec'd tolvaptan 2/11 - repeat tolvaptan as needed. Limit FW  CRITICAL CARE Performed by: Glori Bickers  Total critical care time: 40 minutes  Critical care time was exclusive of separately billable procedures and treating other patients.  Critical care was necessary to treat or prevent imminent or life-threatening deterioration.  Critical care was time spent personally by me (independent of midlevel providers or residents) on the following activities: development of treatment plan with patient and/or surrogate as well as nursing, discussions with consultants, evaluation of patient's response to treatment, examination of patient, obtaining history from patient or surrogate, ordering and performing treatments and interventions, ordering and review of laboratory studies, ordering and review of radiographic studies, pulse oximetry and  re-evaluation of patient's condition.   Length of Stay: [place], MD  01/16/2022, 11:10 AM  Advanced Heart Failure Team Pager [phone] (M-F; 7a - 5p)  Please contact [facility] for night-coverage after hours (5p -7a ) and weekends on [email or web address]

## 2022-01-16 NOTE — Progress Notes (Signed)
PHARMACY - TOTAL PARENTERAL NUTRITION CONSULT NOTE  Indication: Intolerance to EN  Patient Measurements: Height: 5\' 4"  (162.6 cm) Weight: 55.6 kg (122 lb 9.2 oz) IBW/kg (Calculated) : 54.7 TPN AdjBW (KG): 52.2 Body mass index is 21.04 kg/m. Usual weight 54-56 kg  Assessment:  [name] presented 1/30 with SOB.  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  Patient's appetite was worse post abdominal surgery and was eating poorly since admission.  She was then started on nocturnal TF on 2/6, cortrak dislodged on 2/9 after Mitraclip procedure, then continuous TF resumed on 2/10 post extubation.  She was having nausea and TF held 2/12.  Pharmacy consulted to dose TPN.  Glucose / Insulin: no hx DM - CBGs < 180 on sSSI Electrolytes: Na up to 126 post Samsca 15mg  on 2/11, low CL, K up to 4.6 post 85mEq PT, Phos was 4 on 2/9, others WNL Renal: SCr up to 1.64, BUN WNL Hepatic: LFTs / tbili WNL, prealbumin 11.1 on 2/6, albumin 2.5 Intake / Output; MIVF: UOP 1 ml/kg/hr with Diamox, NG 22mL, net +9.5L (weight 52.2 > 55.6 kg likely d/t volume) GI Imaging: only abd XRs to confirm tube placement GI Surgeries / Procedures: N/A  Central access: PICC placed 01/05/22 TPN start date: 01/16/22  Nutritional Goals:  RD Estimated Needs Total Energy Estimated Needs: 1550-1750 Total Protein Estimated Needs: 75-85 grams Total Fluid Estimated Needs: 1.5-1.7 L  Current Nutrition:  FLD Off TF 2/12  Plan:  Start concentrated TPN at 35 mL/hr at 1800.  Monitor volume status to advance to goal rate of 55 ml/hr. Electrolytes in TPN: Na 174mEq/L, K 14mEq/L, Ca 39mEq/L, Mg 34mEq/L, Phos 73mmol/L, max CL Continue daily multivitamin PT (no MVI and trace elements in TPN) Continue sensitive SSI Q4H Standard TPN labs and nursing care orders  Katharyn Schauer D. Mina Marble, PharmD, BCPS, [place] 01/16/2022, 1:14 PM

## 2022-01-16 NOTE — Progress Notes (Signed)
ANTICOAGULATION CONSULT NOTE - Follow Up Consult  Pharmacy Consult for heparin Indication: atrial fibrillation  Labs: Recent Labs    01/14/22 0400 01/15/22 0434 01/15/22 0743 01/15/22 0950 01/15/22 1800 01/16/22 0447 01/16/22 0600 01/16/22 0945  HGB 11.7* 10.7*  --   --   --  9.9*  --   --   HCT 36.4 34.3*  --   --   --  31.6*  --   --   PLT 211 169  --   --   --  102*  --   --   HEPARINUNFRC 0.35  --  0.35  --   --   --   --  0.34  CREATININE 1.49* 1.51*  --    < > 1.61* 1.51* 1.64*  --    < > = values in this interval not displayed.    Assessment: 81yo female who was on apix PTA 2.5mg  BID for a fib that was left off med rec and not continued on admit to ED on 1/30. Patient underwent TEE/DCCV 2/3 now s/p MitraClip 2/9.   Heparin level this morning is therapeutic on 1300 units/hr. No infusion issues or s/sx of bleeding noted. Pltc down - watch closely.  Goal of Therapy:  Heparin level 0.3-0.7 units/ml   Plan:  Continue IV heparin to 1300 units/hr Daily heparin level and CBC while on heparin  Arrie Senate, PharmD, [place], [facility] Clinical Pharmacist [phone] Please check AMION for all [facility] Pharmacy numbers 01/16/2022

## 2022-01-16 NOTE — Progress Notes (Signed)
[place] Progress Note Patient Name: LAMYA LAUSCH DOB: Apr 26, 1941 MRN: [id number]   Date of Service  01/16/2022  HPI/Events of Note  Na 119. Per notes, may be due to hypervolemia S/p tolvaptan  eICU Interventions  Defer to day team to [place] 01/16/2022, 6:22 AM

## 2022-01-17 ENCOUNTER — Encounter (HOSPITAL_COMMUNITY): Payer: Self-pay | Admitting: Internal Medicine

## 2022-01-17 ENCOUNTER — Inpatient Hospital Stay (HOSPITAL_COMMUNITY): Payer: Medicare Other

## 2022-01-17 DIAGNOSIS — J9601 Acute respiratory failure with hypoxia: Secondary | ICD-10-CM | POA: Diagnosis not present

## 2022-01-17 DIAGNOSIS — N179 Acute kidney failure, unspecified: Secondary | ICD-10-CM | POA: Diagnosis not present

## 2022-01-17 DIAGNOSIS — I5043 Acute on chronic combined systolic (congestive) and diastolic (congestive) heart failure: Secondary | ICD-10-CM | POA: Diagnosis not present

## 2022-01-17 LAB — GLUCOSE, CAPILLARY
Glucose-Capillary: 115 mg/dL — ABNORMAL HIGH (ref 70–99)
Glucose-Capillary: 137 mg/dL — ABNORMAL HIGH (ref 70–99)
Glucose-Capillary: 140 mg/dL — ABNORMAL HIGH (ref 70–99)
Glucose-Capillary: 143 mg/dL — ABNORMAL HIGH (ref 70–99)
Glucose-Capillary: 148 mg/dL — ABNORMAL HIGH (ref 70–99)
Glucose-Capillary: 356 mg/dL — ABNORMAL HIGH (ref 70–99)

## 2022-01-17 LAB — CBC WITH DIFFERENTIAL/PLATELET
Abs Immature Granulocytes: 0.05 10*3/uL (ref 0.00–0.07)
Basophils Absolute: 0 10*3/uL (ref 0.0–0.1)
Basophils Relative: 1 %
Eosinophils Absolute: 0.2 10*3/uL (ref 0.0–0.5)
Eosinophils Relative: 2 %
HCT: 31.7 % — ABNORMAL LOW (ref 36.0–46.0)
Hemoglobin: 10 g/dL — ABNORMAL LOW (ref 12.0–15.0)
Immature Granulocytes: 1 %
Lymphocytes Relative: 7 %
Lymphs Abs: 0.6 10*3/uL — ABNORMAL LOW (ref 0.7–4.0)
MCH: 27.2 pg (ref 26.0–34.0)
MCHC: 31.5 g/dL (ref 30.0–36.0)
MCV: 86.4 fL (ref 80.0–100.0)
Monocytes Absolute: 1.5 10*3/uL — ABNORMAL HIGH (ref 0.1–1.0)
Monocytes Relative: 17 %
Neutro Abs: 6.5 10*3/uL (ref 1.7–7.7)
Neutrophils Relative %: 72 %
Platelets: 144 10*3/uL — ABNORMAL LOW (ref 150–400)
RBC: 3.67 MIL/uL — ABNORMAL LOW (ref 3.87–5.11)
RDW: 18.2 % — ABNORMAL HIGH (ref 11.5–15.5)
WBC: 8.8 10*3/uL (ref 4.0–10.5)
nRBC: 0 % (ref 0.0–0.2)

## 2022-01-17 LAB — COMPREHENSIVE METABOLIC PANEL
ALT: 10 U/L (ref 0–44)
AST: 12 U/L — ABNORMAL LOW (ref 15–41)
Albumin: 2.1 g/dL — ABNORMAL LOW (ref 3.5–5.0)
Alkaline Phosphatase: 66 U/L (ref 38–126)
Anion gap: 8 (ref 5–15)
BUN: 22 mg/dL (ref 8–23)
CO2: 29 mmol/L (ref 22–32)
Calcium: 8.3 mg/dL — ABNORMAL LOW (ref 8.9–10.3)
Chloride: 83 mmol/L — ABNORMAL LOW (ref 98–111)
Creatinine, Ser: 1.62 mg/dL — ABNORMAL HIGH (ref 0.44–1.00)
GFR, Estimated: 32 mL/min — ABNORMAL LOW (ref >60)
Glucose, Bld: 423 mg/dL — ABNORMAL HIGH (ref 70–99)
Potassium: 3.9 mmol/L (ref 3.5–5.1)
Sodium: 120 mmol/L — ABNORMAL LOW (ref 135–145)
Total Bilirubin: 0.2 mg/dL — ABNORMAL LOW (ref 0.3–1.2)
Total Protein: 4.7 g/dL — ABNORMAL LOW (ref 6.5–8.1)

## 2022-01-17 LAB — HEPARIN LEVEL (UNFRACTIONATED): Heparin Unfractionated: 0.52 IU/mL (ref 0.30–0.70)

## 2022-01-17 LAB — BASIC METABOLIC PANEL WITH GFR
Anion gap: 8 (ref 5–15)
BUN: 25 mg/dL — ABNORMAL HIGH (ref 8–23)
CO2: 30 mmol/L (ref 22–32)
Calcium: 8.8 mg/dL — ABNORMAL LOW (ref 8.9–10.3)
Chloride: 90 mmol/L — ABNORMAL LOW (ref 98–111)
Creatinine, Ser: 1.62 mg/dL — ABNORMAL HIGH (ref 0.44–1.00)
GFR, Estimated: 32 mL/min — ABNORMAL LOW
Glucose, Bld: 136 mg/dL — ABNORMAL HIGH (ref 70–99)
Potassium: 4.2 mmol/L (ref 3.5–5.1)
Sodium: 128 mmol/L — ABNORMAL LOW (ref 135–145)

## 2022-01-17 LAB — COOXEMETRY PANEL
Carboxyhemoglobin: 1.4 % (ref 0.5–1.5)
Carboxyhemoglobin: 1.3 % (ref 0.5–1.5)
Methemoglobin: 0.6 % (ref 0.0–1.5)
Methemoglobin: 0.6 % (ref 0.0–1.5)
O2 Saturation: 87.8 %
O2 Saturation: 72 %
Total hemoglobin: 10.1 g/dL — ABNORMAL LOW (ref 12.0–16.0)
Total hemoglobin: 9.8 g/dL — ABNORMAL LOW (ref 12.0–16.0)

## 2022-01-17 LAB — PHOSPHORUS: Phosphorus: 3.7 mg/dL (ref 2.5–4.6)

## 2022-01-17 LAB — MAGNESIUM: Magnesium: 2.2 mg/dL (ref 1.7–2.4)

## 2022-01-17 LAB — TRIGLYCERIDES: Triglycerides: 70 mg/dL

## 2022-01-17 LAB — VITAMIN D 25 HYDROXY (VIT D DEFICIENCY, FRACTURES): Vit D, 25-Hydroxy: 58.81 ng/mL (ref 30–100)

## 2022-01-17 LAB — FOLATE: Folate: 25.4 ng/mL (ref >5.9)

## 2022-01-17 LAB — PROCALCITONIN: Procalcitonin: <0.1 ng/mL

## 2022-01-17 MED ORDER — VITAL 1.5 CAL PO LIQD
1000.0000 mL | ORAL | Status: DC
  Administered 2022-01-17 – 2022-01-19 (×2): 1000 mL

## 2022-01-17 MED ORDER — ENSURE ENLIVE PO LIQD: 237.0000 mL | Freq: Two times a day (BID) | ORAL | Status: DC

## 2022-01-17 MED ORDER — STERILE WATER FOR INJECTION IV SOLN
INTRAVENOUS | Status: AC
  Filled 2022-01-17: qty 341.6

## 2022-01-17 NOTE — Plan of Care (Signed)

## 2022-01-17 NOTE — Anesthesia Postprocedure Evaluation (Signed)
Anesthesia Post Note  Patient: [name]  Procedure(s) Performed: MITRAL VALVE REPAIR     Patient location during evaluation: Cath Lab Anesthesia Type: General Level of consciousness: awake and alert Pain management: pain level controlled Vital Signs Assessment: post-procedure vital signs reviewed and stable Respiratory status: spontaneous breathing, nonlabored ventilation, respiratory function stable and patient connected to nasal cannula oxygen Cardiovascular status: blood pressure returned to baseline and stable Postop Assessment: no apparent nausea or vomiting Anesthetic complications: no   There were no known notable events for this encounter.  Last Vitals:  Vitals:   01/17/22 0900 01/17/22 1000  BP: 105/62 109/63  Pulse: 98 (!) 109  Resp: 19 20  Temp:    SpO2: 96% 91%    Last Pain:  Vitals:   01/17/22 0800  TempSrc:   PainSc: 0-No pain                 Arbor Leer

## 2022-01-17 NOTE — Progress Notes (Signed)
Nutrition Follow-up  DOCUMENTATION CODES:   Severe malnutrition in context of chronic illness  INTERVENTION:   - TPN per Pharmacy  Initiate trickle tube feeds via post-pyloric Cortrak: - Vital 1.5 @ 20 ml/hr (480 ml/day)  Trickle tube feeding regimen provides 720 kcal, 32 grams of protein, and 367 ml of H2O.    RD will monitor for ability to advance tube feeds to goal: - Vital 1.5 @ 50 ml/hr (1200 ml/day)  Tube feeding regimen at goal rate will provide 1800 kcal, 81 grams of protein, and 917 ml of H2O.   - Check vitamin A, vitamin C, vitamin D, thiamine, folate, zinc, and copper labs  - d/c Ensure Enlive as pt does not tolerate oral nutrition supplements (increased diarrhea)  - Continue MVI with minerals daily  - Encourage PO intake of full liquid diet  NUTRITION DIAGNOSIS:   Severe Malnutrition related to chronic illness (CHF, COPD) as evidenced by severe fat depletion, severe muscle depletion, percent weight loss (8.4% weight loss in less than 4 months).  Ongoing, being addressed via TF, TPN, and diet advancement  GOAL:   Patient will meet greater than or equal to 90% of their needs  Progressing  MONITOR:   Diet advancement, Labs, Weight trends, TF tolerance, Skin, I & O's  REASON FOR ASSESSMENT:   Consult New TPN/TNA  ASSESSMENT:   81 year old female who presented to the ED on 1/30 with SOB. PMH of CHF, atrial fibrillation, COPD, HLD, anemia, GERD, CKD stage III, ischemic colon s/p hemicolectomy and ileostomy. Pt admitted with acute on chronic HF, AKI.  02/02 - s/p R/L heart cath 02/03 - s/p TEE-DCCV 02/06 - Cortrak placed (tip gastric), nocturnal tube feeds started 02/09 - s/p MitraClip, TEE post-procedure, Cortrak dislodged, OG tube placed 02/10 - extubated, Cortrak replaced (tip gastric) 02/12 - diet advanced to full liquids, TPN initiated 02/13 - Cortrak advanced to post-pyloric (tip in first portion of duodenum)  RD consulted for new TPN which  was started yesterday at 35 ml/hr. Discussed with Pharmacy. Pt currently on full liquid diet but no meal completions charted since diet advanced to full liquids on 2/12. Per Pharmacy, plan is to continue TPN at current rate of 35 ml/hr which provides 1049 kcal and 51 grams of protein and reevaluate for discontinuing TPN tomorrow.  Cortrak able to be advanced into first portion of duodenum per abdominal x-ray reading. Discussed pt with CCM MD who agreed with resuming trickle tube feeds via Cortrak. Per discussion with pt and husband, pt tolerated some of full liquid diet this AM for breakfast (50% of grits and 100% of juice) without N/V. Pt still struggling with diarrhea and does not want to consume any oral nutrition supplements that RD offered including Boost Breeze.  RD to check vitamin and mineral labs. Continue MVI with minerals daily.  Admit weight: 52.2 kg Current weight: 55.6 kg  Medications reviewed and include: colestid 1 gram BID, SSI q 4 hours, megace 40 mg daily, MVI with minerals daily, IV protonix, amiodarone drip, IV abx, heparin drip, milrinone drip, levophed drip, TPN  Vitamin/Mineral Profile:  Thiamine B1: pending Vitamin B12: 783 (WNL) Folate B9: pending Vitamin A: pending Vitamin D: pending Vitamin C: pending Copper: pending Zinc: pending CRP: 8.4 (H)  Labs reviewed: sodium 120, creatinine 1.62, hemoglobin 10.0, platelets 144 CBG's: 115-356 x 24 hours  UOP: 1325 ml x 24 hours I/O's: +10.2 L since admit  Diet Order:   Diet Order  Diet full liquid Room service appropriate? Yes; Fluid consistency: Thin  Diet effective now                   EDUCATION NEEDS:   Education needs have been addressed  Skin:  Skin Assessment: Skin Integrity Issues: Stage I: bilateral ischial tuberosity Other: pressure injury to buttocks  Last BM:  01/16/22  Height:   Ht Readings from Last 1 Encounters:  01/05/22 5\' 4"  (1.626 m)    Weight:   Wt Readings  from Last 1 Encounters:  01/17/22 55.6 kg    BMI:  Body mass index is 21.04 kg/m.  Estimated Nutritional Needs:   Kcal:  1700-1900  Protein:  80-95 grams  Fluid:  >/= 1.7 L    Gustavus Bryant, MS, RD, LDN Inpatient Clinical Dietitian Please see AMiON for contact information.

## 2022-01-17 NOTE — Progress Notes (Addendum)
Advanced Heart Failure Rounding Note  PCP-Cardiologist: Will Meredith Leeds, MD   Subjective:    2/1 A fib RVR--> Started on amio drip + heparin drip. PICC placed. Diuretics held.  2/2 RHC/LHC - Had cath with preserved cardiac output, min nonob CAD, elevated PCWP suggestive of severe MR.  2/4 TEE/DC-CV  LVEF 15% RV severely HK. DC-CV to NSR 2/7 Milrinone added for low co-ox (50%). 2/09 s/p Mitraclip A2/P2 2/10 Echo EF < 20% RV moderately decreased MV clip ok. Mild to mod MR.  2/12 Started on abx for RUL PNA  Remains on milrinone 0.125. higher NE requirements today, 8>>14 mcg/min. Co-ox 88%   Off diuretics w/ low volume status, SCr stable 1.62. Wt unchanged 122 lb. CVP 5   Na 120, glucose 423 (doubt BMP accurate, pulled from TPN line). Repeat BMP pending.   Feels tired today. Did not sleep well last night. Otherwise no other complaints. Remains on 4L [place], comfortable on current support.    Objective:   Weight Range: 55.6 kg Body mass index is 21.04 kg/m.   Vital Signs:   Temp:  [97.6 F (36.4 C)-98 F (36.7 C)] 97.6 F (36.4 C) (02/13 0726) Pulse Rate:  [86-113] 89 (02/13 0500) Resp:  [16-29] 18 (02/13 0500) BP: (69-113)/(35-97) 104/64 (02/13 0500) SpO2:  [85 %-98 %] 98 % (02/13 0810) Arterial Line BP: (100-122)/(43-66) 122/66 (02/12 1500) Weight:  [55.6 kg] 55.6 kg (02/13 0500) Last BM Date: 01/16/22  Weight change: Filed Weights   01/15/22 0500 01/16/22 0500 01/17/22 0500  Weight: 55.6 kg 55.6 kg 55.6 kg    Intake/Output:   Intake/Output Summary (Last 24 hours) at 01/17/2022 0848 Last data filed at 01/17/2022 0400 Gross per 24 hour  Intake 1888.33 ml  Output 1175 ml  Net 713.33 ml      Physical Exam   CVP 5  General:  thin/frail appearing elderly WF. No respiratory difficulty HEENT: normal + cor trak  Neck: supple. no JVD. Carotids 2+ bilat; no bruits. No lymphadenopathy or thyromegaly appreciated. Cor: PMI nondisplaced. Irregularly irregular  rhythm and rate No rubs, gallops or murmurs. Lungs: RUL crackles + expiratory rhonchi  Abdomen: soft, nontender, nondistended. No hepatosplenomegaly. No bruits or masses. Good bowel sounds. Extremities: no cyanosis, clubbing, rash, edema Neuro: alert & oriented x 3, cranial nerves grossly intact. moves all 4 extremities w/o difficulty. Affect pleasant.   Telemetry   Atrial fibrillation 90s, occasional PVCs Personally reviewed   Labs    CBC Recent Labs    01/16/22 0447 01/17/22 0424  WBC 8.1 8.8  NEUTROABS 5.8 6.5  HGB 9.9* 10.0*  HCT 31.6* 31.7*  MCV 86.3 86.4  PLT 102* 240*   Basic Metabolic Panel Recent Labs    01/16/22 0447 01/16/22 0600 01/17/22 0424  NA 119* 126* 120*  K 4.0 4.6 3.9  CL 79* 84* 83*  CO2 30 32 29  GLUCOSE 395* 134* 423*  BUN 19 19 22   CREATININE 1.51* 1.64* 1.62*  CALCIUM 8.1* 8.7* 8.3*  MG 2.1  --  2.2  PHOS  --   --  3.7   Liver Function Tests Recent Labs    01/16/22 1330 01/17/22 0424  AST 14* 12*  ALT 9 10  ALKPHOS 68 66  BILITOT 0.4 0.2*  PROT 5.1* 4.7*  ALBUMIN 2.3* 2.1*    No results for input(s): LIPASE, AMYLASE in the last 72 hours. Cardiac Enzymes No results for input(s): CKTOTAL, CKMB, CKMBINDEX, TROPONINI in the last 72 hours.  BNP:  BNP (last 3 results) Recent Labs    12/16/2021 1737 01/15/22 0950  BNP 2,812.3* 1,297.4*    ProBNP (last 3 results) No results for input(s): PROBNP in the last 8760 hours.   D-Dimer No results for input(s): DDIMER in the last 72 hours. Hemoglobin A1C No results for input(s): HGBA1C in the last 72 hours. Fasting Lipid Panel Recent Labs    01/17/22 0424  TRIG 70   Thyroid Function Tests No results for input(s): TSH, T4TOTAL, T3FREE, THYROIDAB in the last 72 hours.  Invalid input(s): FREET3   Other results:   Imaging    No results found.   Medications:     Scheduled Medications:  arformoterol  15 mcg Nebulization BID   budesonide (PULMICORT) nebulizer  solution  0.25 mg Nebulization BID   chlorhexidine  15 mL Mouth Rinse BID   Chlorhexidine Gluconate Cloth  6 each Topical Daily   colestipol  1 g Per Tube BID   fluticasone  1 spray Each Nare BID   hydroxychloroquine  400 mg Per Tube QHS   insulin aspart  0-9 Units Subcutaneous Q4H   levothyroxine  25 mcg Per Tube QAC breakfast   loratadine  10 mg Per Tube Daily   mouth rinse  15 mL Mouth Rinse q12n4p   megestrol  40 mg Oral Daily   montelukast  10 mg Per Tube Daily   multivitamin with minerals  1 tablet Per Tube Daily   pantoprazole (PROTONIX) IV  40 mg Intravenous QHS   revefenacin  175 mcg Nebulization Daily   sodium chloride flush  10-40 mL Intracatheter Q12H   sodium chloride flush  3 mL Intravenous Q12H   sodium chloride flush  3 mL Intravenous Q12H    Infusions:  sodium chloride Stopped (01/14/22 0519)   sodium chloride     amiodarone 30 mg/hr (01/17/22 0400)   ceFEPime (MAXIPIME) IV 2 g (01/16/22 1215)   feeding supplement (VITAL 1.5 CAL) 1,000 mL (01/15/22 1537)   heparin 1,300 Units/hr (01/17/22 0400)   milrinone 0.125 mcg/kg/min (01/17/22 0400)   norepinephrine (LEVOPHED) Adult infusion 14 mcg/min (01/17/22 0824)   TPN ADULT (ION) 35 mL/hr at 01/17/22 0400    PRN Medications: sodium chloride, sodium chloride, acetaminophen, ALPRAZolam, guaiFENesin-dextromethorphan, hydrALAZINE, ipratropium, labetalol, levalbuterol, loperamide, melatonin, ondansetron (ZOFRAN) IV, phenol, sodium chloride flush, sodium chloride flush, sodium chloride flush, traZODone    Patient Profile   [name] is a 81 year old with a history of PAF, moderate AS, severe MR, COPD, quit smoking 25 years ago, PAD, HTN, hyperlipidemia, CVA, NICM, tobacco abuse, ischemic bowel with resection in 2017,11/04/22 left ureteral calculus S/P utreteroscopy and stent,  and chronic HFrEF.     Admitted A/C HFrEF and A fib RVR.   Assessment/Plan   A/C HFrEF - Echo this admit EF down to 25% from previous Echo EF  40-45% in 2018 .  BNP on admit > 2800.  - Has LBBB, may eventually benefit from CRT depending on progress - Cath 2/2 with minimal CAD preserved cardiac output, elevated PCWP (intermittent v-waves up to 50) suggestive of severe MR.  - TEE 2/3 LVEF 15% RV severe HK. Severe MR - S/p mitral valve repair with mitraclip 02/09 - Echo 2/10 EF < 20% significant LV dyssynchrony due to LBBB. RV moderately decreased MV clip ok. Mild to mod MR. Personally reviewed - on Milrinone 0.125 + 14 NE. Co-ox 88%. Continue to wean NE Keep MAP > 65 < 80 - CVP 5. Hold Lasix today  -  Continue abx for RUL PNA - GDMT limited by need for NE - Echo with significant LV dyssynchrony. Suspect LBBB may be playing significant role here.    2. Severe MR  - 12/28/21 TEE with severe MR - s/p mitraclip A2/P2 02/09 - TEE post procedure with mild to moderate residual MR - Echo 2/10 EF < 20% significant LV dyssynchrony due to LBBB. RV moderately decreased MV clip ok. Mild to mod MR. Personally reviewed   3. Persistent AF - Last dose of eliquis 01/04/2022 (morning) -> heparin - s/p DC-CV 2/3 - Back in Afib, V-rates 90-100s - Continue amio while on milrinone. Continue amio gtt at 30  - Plan repeat DC-CV this week as able   4. AKI  - Recently had diuretics increased on 1/5. Creatinine on that time was 1.2.  - On admit creatinine was 1.7 - SCr 1.5-> 1.6 today. Hold diuretics   5. A/C Respiratory Failure with RUL PNA - On exam has severe underlying COPD - Volume status ok/low.  - CXR c.w RUL PNA.  PCT <0.10. Continue abx. D/w CCM at bedisdie   6. Anemia  - Hgb 9.9. Iron Sats 7%.  - Received feraheme 02/03  7. Hypokalemia/hypomagnesemia - supp as needed  8. Severe protein calorie malnutrition - h/o embolic event to colon in 2017 requiring emergency R [facility]colectomy at [facility] followed by colostomy and then takedown - Prealbumin 11.1 - Cor-trak placed 2/6 for nighttime feeds. - Unable to tolerate TFs due to n/v. Cannot  get Reglan with diarrhea - Continue TPN today. Will ask Nutrition to move cor-trak post-pyloric  9. Severe deconditioning - PT/OT following  10. Hyponatremia - Rec'd tolvaptan 2/11 - Repeat BMP pending  - repeat tolvaptan as needed. Limit FW   Length of Stay: [address] PA-C  01/17/2022, 8:48 AM  Advanced Heart Failure Team Pager [phone] (M-F; 7a - 5p)  Please contact [facility] for night-coverage after hours (5p -7a ) and weekends on [email or web address]  Agree with above.   Remains on NE and milrinone. Co-ox high. Started on abx for RUL PNA yesterday. Also started on TPN.  More fatigued today but says she was up all night. Breathing feels better. Remains in AF on IV amio and heparin. CVP low  General:  Weak appearing. No resp difficulty HEENT: normal + cor-trak Neck: supple. no JVD. Carotids 2+ bilat; no bruits. No lymphadenopathy or thryomegaly appreciated. Cor: PMI nondisplaced. Irregular rate & rhythm. No rubs, gallops or murmurs. Lungs: + RUL crackles Abdomen: soft, nontender, nondistended. No hepatosplenomegaly. No bruits or masses. Good bowel sounds. Extremities: no cyanosis, clubbing, rash, edema Neuro: alert & orientedx3, cranial nerves grossly intact. moves all 4 extremities w/o difficulty. Affect pleasant  Remains very tenuous with profund weakness and malnutrition. Hemodynamics and respiratory status much better after Clip.[name] NE as tolerated. Continue abx and TPN. Will get cor-trak moved post-pyloric. Attempt to ambulate. Try to ensure regualr sleep-wake cycle.   CRITICAL CARE Performed by: Glori Bickers  Total critical care time: 35 minutes  Critical care time was exclusive of separately billable procedures and treating other patients.  Critical care was necessary to treat or prevent imminent or life-threatening deterioration.  Critical care was time spent personally by me (independent of midlevel providers or residents) on the following  activities: development of treatment plan with patient and/or surrogate as well as nursing, discussions with consultants, evaluation of patient's response to treatment, examination of patient, obtaining history from patient or surrogate, ordering and performing treatments  and interventions, ordering and review of laboratory studies, ordering and review of radiographic studies, pulse oximetry and re-evaluation of patient's condition.  Glori Bickers, MD  12:32 PM

## 2022-01-17 NOTE — Progress Notes (Addendum)
PHARMACY - TOTAL PARENTERAL NUTRITION CONSULT NOTE  Indication: Intolerance to EN  Patient Measurements: Height: 5\' 4"  (162.6 cm) Weight: 55.6 kg (122 lb 9.2 oz) IBW/kg (Calculated) : 54.7 TPN AdjBW (KG): 52.2 Body mass index is 21.04 kg/m. Usual weight 54-56 kg  Assessment:  80 YOF presented 1/30 with SOB.  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  Patient's appetite was worse post abdominal surgery and was eating poorly since admission.  She was then started on nocturnal TF on 2/6, cortrak dislodged on 2/9 after Mitraclip procedure, then continuous TF resumed on 2/10 post extubation.  She was having nausea and TF held 2/12.  Pharmacy consulted to dose TPN.  Glucose / Insulin: no hx DM - CBGs mostly < 180 Used 5 units sSSI in past 24 hrs Electrolytes: Na 128 ([place] 15mg  on 2/11, max Na in TPN), low CL, others WNL Renal: SCr 1.62, BUN stable Hepatic: LFTs / tbili WNL, prealbumin 11.1 on 2/6, albumin 2.1 Intake / Output; MIVF: UOP 1 ml/kg/hr without Diamox, NG 79mL, net +10.2L (weight 52.2 > 55.6 kg likely d/t volume) GI Imaging: only abd XRs to confirm tube placement GI Surgeries / Procedures: N/A  Central access: PICC placed 01/05/22 TPN start date: 01/16/22  Nutritional Goals:  RD Estimated Needs Total Energy Estimated Needs: 1550-1750 Total Protein Estimated Needs: 75-85 grams Total Fluid Estimated Needs: 1.5-1.7 L  Current Nutrition:  FLD TPN  Plan:  Continue concentrated TPN at 35 ml/hr per CCM's request (goal rate 55 mL/hr) TPN provides 51g AA and 1049 kCal, meeting ~60% of patient's needs Electrolytes in TPN: Na 152mEq/L, K 36mEq/L, Ca 65mEq/L, reduce Mag slightly to 39mEq/L, Phos 36mmol/L, max CL  Continue daily multivitamin PT (no MVI and trace elements in TPN) Continue sensitive SSI Q4H  F/U AM labs  Planning to advance cortrak to post-pyloric position and resume TF.  F/U with possibility of  stopping TPN in AM.  Wanda Cellucci D. Mina Marble, PharmD, BCPS, [place] 01/17/2022, 10:28 AM

## 2022-01-17 NOTE — Progress Notes (Signed)
Cortrak  Person Inserting Tube:  [name], Creola Corn, RD Tube Type:  Cortrak - 43 inches Tube Size:  10 Tube Location:  Left nare Initial Placement:  Postpyloric Secured by: Bridle Technique Used to Measure Tube Placement:  Marking at nare/corner of mouth Cortrak Secured At:  83 cm  Cortrak Tube Team Note:  Consult received to advance Cortrak feeding tube to postpyloric. Tube was initially with gastric tip bridled at 70cm in L nare. RD advanced tube with assistance of second RD to 83cm and re-bridled in L nare.   X-ray is required, abdominal x-ray has been ordered by the Cortrak team. Please confirm tube placement before using the Cortrak tube.   If the tube becomes dislodged please keep the tube and contact the Cortrak team at [email or web address] ([email or web address]) for replacement.  If after hours and replacement cannot be delayed, place a NG tube and confirm placement with an abdominal x-ray.     Theone Stanley., MS, RD, LDN (she/her/hers) RD pager number and weekend/on-call pager number located in [place].

## 2022-01-17 NOTE — Plan of Care (Signed)

## 2022-01-17 NOTE — Progress Notes (Signed)
NAME:  [name], MRN:  [id number], DOB:  Mar 14, 1941, LOS: 70 ADMISSION DATE:  12/17/2021, CONSULTATION DATE:  01/29/2022 REFERRING MD:  , CHIEF COMPLAINT:  Respiratory Failure   History of Present Illness:  81 year old woman with hx of NICM, mitral failure, SSS, afib on AC, COPD who presented with decompensated heart failure.  After volume and inotrope optimization she underwent mitraclip 2/9 and remains intubated post procedure.  PCCM consulted for further management.  Pertinent  Medical History   has a past medical history of Allergic rhinitis, Anxiety, Atrial septal aneurysm, Basal cell carcinoma, Bradycardia, Cataract, Chronic combined systolic and diastolic CHF (congestive heart failure) ([place]), CKD (chronic kidney disease), stage III ([place]), Clostridium difficile infection, COPD (chronic obstructive pulmonary disease) ([place]), Depression (11/29/2013), DI (detrusor instability), Esophageal stricture, Hemorrhoids, Hiatal hernia, History of kidney stones, HLD (hyperlipidemia), HTN (hypertension), Ischemic colon ([place]), Lung nodule, Mild CAD, Mitral regurgitation, Nephrolithiasis, NICM (nonischemic cardiomyopathy) ([place]), Orthostatic hypotension, Osteoarthritis, PAF (paroxysmal atrial fibrillation) ([place]), Pneumonia (2013), PVC's (premature ventricular contractions), S/P mitral valve clip implantation (01/05/2022), Shingles (since Nov 18, 2013), Status post dilation of esophageal narrowing, Stroke ([place]) (2001), Syncope, and TIA (transient ischemic attack) (last 2001).  Significant Hospital Events: Including procedures, antibiotic start and stop dates in addition to other pertinent events   mitraclip 2/9 TEE 2/9 2/10 extubated 2/12 TPN started  Interim History / Subjective:  Tired. Complains of mild pain buttocks r/t sitting for extended periods.   Objective   Blood pressure 104/64, pulse 89, temperature 97.6 F (36.4 C), temperature source Oral, resp. rate 18, height 5\' 4"  (1.626 m), weight  55.6 kg, SpO2 98 %. CVP:  [3 mmHg-30 mmHg] 9 mmHg      Intake/Output Summary (Last 24 hours) at 01/17/2022 0817 Last data filed at 01/17/2022 0400 Gross per 24 hour  Intake 1888.33 ml  Output 1175 ml  Net 713.33 ml    Filed Weights   01/15/22 0500 01/16/22 0500 01/17/22 0500  Weight: 55.6 kg 55.6 kg 55.6 kg    Examination:  General: Frail elderly female in no distress HENT: Cortrak in place Lungs: breathing comfortably. Coarse crackles in bilateral bases Cardiovascular: IRIR, no JVD. No edema.  Abdomen: Soft, non-tender. Normoactive.  Extremities: No acute deformity Neuro: somnolent, but easily arouses to verbal.   Ancillary tests personally reviewed:    Echo shows biventricular failure with LVEF < 20% NA down to 120 I/O + 668mL last 24 hours.   CXR 2/12 shows very distinct RUL infiltrate and increased background interstitial marking which are new since admission.   Assessment & Plan:   Cardiogenic shock due to decompensated HFrEF from NICM Severe MR now status post mitral clip.  - Continue milrinone  - Titrate NE to keep MAP >65  Persistent AF - Amiodarone infusion  Acute on chronic respiratory insufficiency due to COPD RUL PNA - HCAP coverage for suspected aspiration event.  - Wean O2 to keep sat > 92%  Hyponatremia due to volume contraction.  Now down to 120, was drawn from TPN line. Lab stick with sodium at 128.   -s/p tolvaptan 1/11 -Free water restrict -Trend chemistry  Chronic diarrhea status post partial colectomy.  - Colestid and immodium for diarrhea.  - Chronic diarrhea workup sent.   Severe Malnutrition with cachexia related to HF GERD with prior peptic stricture - PPI - TPN currenlty at 60% caloric need - Cortrak in place, reposition for post-pyloric placement.  - Ensure  Deconditioning - PT    Best Practice (  right click and "Reselect all SmartList Selections" daily)   Diet/type: continue tube feeds and introduce oral intake DVT  prophylaxis: systemic heparin GI prophylaxis: N/A Lines: yes and it is still needed Foley:  N/A Code Status:  full code Last date of multidisciplinary goals of care discussion [patient and husband updated at the bedside.]  Critical care time  48 minutes  Georgann Housekeeper, AGACNP-BC [place] for personal pager PCCM on call pager [phone] until 7pm. Please call [place] 7p-7a. [phone]  01/17/2022 8:29 AM

## 2022-01-17 NOTE — Progress Notes (Signed)
Physical Therapy Treatment Patient Details Name: [name] MRN: [id number] DOB: 07-Jun-1941 Today's Date: 01/17/2022   History of Present Illness Patient is a 81 y/o female who presents on 12/17/2021 with weakness, fluttering in chest and SOB. CXR- bil pulmonary edema. Found to have acute on chronic CHF, A-fib with RVR and severe mitral regurgitation.s/p cardioversion 2/3. Pt underwent Mitraclip on 2/9, remained intubated after procedure until extubation on 2/10. PMH includes PAF, moderate AS, COPD, quit smoking 25 years ago, PAD, HTN, CVA, NICM, tobacco abuse, ischemic bowel with resection in 2017,11/04/22 left ureteral calculus S/P utreteroscopy and stent and chronic HFrEF.    PT Comments    Patient progressing slowly towards PT goals. Reports feeling tired today as she did not sleep well last night. Requires Min A to stand from chair and Min A with chair follow for gait training with use of EVA walker. Fatigues with activity needing seated rest break. BP stable during session, however Sp02 dropped to mid 80s on 4L/min 02 [place]. Cues provided for pursed lip breathing. RN notified. Pt demonstrates weakness in BLEs, core and decreased activity tolerance/endurance impacting mobility. Will follow.    Recommendations for follow up therapy are one component of a multi-disciplinary discharge planning process, led by the attending physician.  Recommendations may be updated based on patient status, additional functional criteria and insurance authorization.  Follow Up Recommendations  Home health PT (pending improvement)     Assistance Recommended at Discharge Frequent or constant Supervision/Assistance  Patient can return home with the following A little help with bathing/dressing/bathroom;Help with stairs or ramp for entrance;Assist for transportation;Assistance with cooking/housework;A lot of help with walking and/or transfers   Equipment Recommendations  Rolling walker (2 wheels);BSC/3in1     Recommendations for Other Services       Precautions / Restrictions Precautions Precautions: Fall;Other (comment) Precaution Comments: watch O2, BP, cortrak, keep MAP >65 Restrictions Weight Bearing Restrictions: No     Mobility  Bed Mobility               General bed mobility comments: Up in chair upon PT arrival.    Transfers Overall transfer level: Needs assistance Equipment used:  (EVa walker) Transfers: Sit to/from Stand Sit to Stand: Min assist           General transfer comment: Min A to power to standing with cues for hand placement, stood from chair x2.    Ambulation/Gait Ambulation/Gait assistance: Min assist, +2 safety/equipment Gait Distance (Feet): 40 Feet (+ 15') Assistive device: Ethelene Hal Gait Pattern/deviations: Decreased stride length, Narrow base of support, Decreased step length - right, Decreased step length - left, Trunk flexed, Knee flexed in stance - left, Knee flexed in stance - right Gait velocity: decreased Gait velocity interpretation: <1.8 ft/sec, indicate of risk for recurrent falls   General Gait Details: Slow, unsteady gait with increased knee flexion bilaterally, narrow BoS and flexed cervical/thoracic spine needing cues for upright; 1 seated rest break. Sp02 dropped to mid 80s on 4L/min 02 . Cues for pursed lip breathing.   Stairs             Wheelchair Mobility    Modified Rankin (Stroke Patients Only)       Balance Overall balance assessment: Needs assistance Sitting-balance support: Feet supported, No upper extremity supported Sitting balance-[name]: Fair Sitting balance - Comments: Fatigues quickly without back rest and has difficulty keeping head up   Standing balance support: During functional activity, Reliant on assistive device for balance Standing balance-Leahy  Scale: Poor Standing balance comment: Requires UE support and external support                            Cognition  Arousal/Alertness: Lethargic Behavior During Therapy: WFL for tasks assessed/performed Overall Cognitive Status: Within Functional Limits for tasks assessed                                 General Comments: Pt reports being sleepy, did not sleep well last night.        Exercises      General Comments General comments (skin integrity, edema, etc.): Sitting BP pre activity 99/50 (66), sitting BP post activity 90/74 (81), BP after 3 minutes 98/63 (71). Asymptomatic during session.      Pertinent Vitals/Pain Pain Assessment Pain Assessment: No/denies pain    Home Living                          Prior Function            PT Goals (current goals can now be found in the care plan section) Progress towards PT goals: Progressing toward goals    Frequency    Min 3X/week      PT Plan Current plan remains appropriate    Co-evaluation              AM-PAC PT "6 Clicks" Mobility   Outcome Measure  Help needed turning from your back to your side while in a flat bed without using bedrails?: A Little Help needed moving from lying on your back to sitting on the side of a flat bed without using bedrails?: A Little Help needed moving to and from a bed to a chair (including a wheelchair)?: A Little Help needed standing up from a chair using your arms (e.g., wheelchair or bedside chair)?: A Little Help needed to walk in hospital room?: A Lot Help needed climbing 3-5 steps with a railing? : A Lot 6 Click Score: 16    End of Session Equipment Utilized During Treatment: Oxygen;Gait belt Activity Tolerance: Patient tolerated treatment well;Patient limited by fatigue Patient left: in chair;with call bell/phone within reach;with family/visitor present Nurse Communication: Mobility status;Other (comment) (02) PT Visit Diagnosis: Unsteadiness on feet (R26.81);Muscle weakness (generalized) (M62.81);Other abnormalities of gait and mobility (R26.89);Difficulty in  walking, not elsewhere classified (R26.2)     Time: 4193-7902 PT Time Calculation (min) (ACUTE ONLY): 26 min  Charges:  $Gait Training: 23-37 mins                     Marisa Severin, PT, DPT Acute Rehabilitation Services Pager [phone] Office [phone]      [place] 01/17/2022, 12:28 PM

## 2022-01-17 NOTE — Progress Notes (Signed)
ANTICOAGULATION CONSULT NOTE - Follow Up Consult  Pharmacy Consult for heparin Indication: atrial fibrillation  Labs: Recent Labs    01/15/22 0434 01/15/22 0743 01/15/22 0950 01/16/22 0447 01/16/22 0600 01/16/22 0945 01/17/22 0424  HGB 10.7*  --   --  9.9*  --   --  10.0*  HCT 34.3*  --   --  31.6*  --   --  31.7*  PLT 169  --   --  102*  --   --  144*  HEPARINUNFRC  --  0.35  --   --   --  0.34  --   CREATININE 1.51*  --    < > 1.51* 1.64*  --  1.62*   < > = values in this interval not displayed.    Assessment: 81yo female who was on apix PTA 2.5mg  BID for a fib that was left off med rec and not continued on admit to ED on 1/30. Patient underwent TEE/DCCV 2/3 now s/p MitraClip 2/9.   Heparin level this morning continues to be therapeutic on 1300 units/hr. No infusion issues or s/sx of bleeding noted. Pltc up today - watch closely.  Goal of Therapy:  Heparin level 0.3-0.7 units/ml   Plan:  Continue IV heparin to 1300 units/hr Daily heparin level and CBC while on heparin  Erin Hearing PharmD., BCPS Clinical Pharmacist 01/17/2022 8:54 AM

## 2022-01-18 DIAGNOSIS — R570 Cardiogenic shock: Secondary | ICD-10-CM

## 2022-01-18 DIAGNOSIS — I340 Nonrheumatic mitral (valve) insufficiency: Secondary | ICD-10-CM

## 2022-01-18 DIAGNOSIS — J9601 Acute respiratory failure with hypoxia: Secondary | ICD-10-CM | POA: Diagnosis not present

## 2022-01-18 DIAGNOSIS — I5043 Acute on chronic combined systolic (congestive) and diastolic (congestive) heart failure: Secondary | ICD-10-CM | POA: Diagnosis not present

## 2022-01-18 DIAGNOSIS — N179 Acute kidney failure, unspecified: Secondary | ICD-10-CM | POA: Diagnosis not present

## 2022-01-18 LAB — CBC WITH DIFFERENTIAL/PLATELET
Abs Immature Granulocytes: 0.04 10*3/uL (ref 0.00–0.07)
Basophils Absolute: 0 10*3/uL (ref 0.0–0.1)
Basophils Relative: 0 %
Eosinophils Absolute: 0.1 10*3/uL (ref 0.0–0.5)
Eosinophils Relative: 2 %
HCT: 31.3 % — ABNORMAL LOW (ref 36.0–46.0)
Hemoglobin: 9.8 g/dL — ABNORMAL LOW (ref 12.0–15.0)
Immature Granulocytes: 1 %
Lymphocytes Relative: 7 %
Lymphs Abs: 0.6 10*3/uL — ABNORMAL LOW (ref 0.7–4.0)
MCH: 27.2 pg (ref 26.0–34.0)
MCHC: 31.3 g/dL (ref 30.0–36.0)
MCV: 86.9 fL (ref 80.0–100.0)
Monocytes Absolute: 1.4 10*3/uL — ABNORMAL HIGH (ref 0.1–1.0)
Monocytes Relative: 17 %
Neutro Abs: 6 10*3/uL (ref 1.7–7.7)
Neutrophils Relative %: 73 %
Platelets: 131 10*3/uL — ABNORMAL LOW (ref 150–400)
RBC: 3.6 MIL/uL — ABNORMAL LOW (ref 3.87–5.11)
RDW: 18.1 % — ABNORMAL HIGH (ref 11.5–15.5)
WBC: 8.2 10*3/uL (ref 4.0–10.5)
nRBC: 0 % (ref 0.0–0.2)

## 2022-01-18 LAB — BASIC METABOLIC PANEL
Anion gap: 10 (ref 5–15)
BUN: 28 mg/dL — ABNORMAL HIGH (ref 8–23)
CO2: 27 mmol/L (ref 22–32)
Calcium: 8.7 mg/dL — ABNORMAL LOW (ref 8.9–10.3)
Chloride: 90 mmol/L — ABNORMAL LOW (ref 98–111)
Creatinine, Ser: 1.47 mg/dL — ABNORMAL HIGH (ref 0.44–1.00)
GFR, Estimated: 35 mL/min — ABNORMAL LOW (ref >60)
Glucose, Bld: 303 mg/dL — ABNORMAL HIGH (ref 70–99)
Potassium: 4.5 mmol/L (ref 3.5–5.1)
Sodium: 127 mmol/L — ABNORMAL LOW (ref 135–145)

## 2022-01-18 LAB — GLUCOSE, CAPILLARY
Glucose-Capillary: 121 mg/dL — ABNORMAL HIGH (ref 70–99)
Glucose-Capillary: 123 mg/dL — ABNORMAL HIGH (ref 70–99)
Glucose-Capillary: 130 mg/dL — ABNORMAL HIGH (ref 70–99)
Glucose-Capillary: 143 mg/dL — ABNORMAL HIGH (ref 70–99)
Glucose-Capillary: 397 mg/dL — ABNORMAL HIGH (ref 70–99)

## 2022-01-18 LAB — COOXEMETRY PANEL
Carboxyhemoglobin: 1.1 % (ref 0.5–1.5)
Methemoglobin: 0.9 % (ref 0.0–1.5)
O2 Saturation: 67.3 %
Total hemoglobin: 8.9 g/dL — ABNORMAL LOW (ref 12.0–16.0)

## 2022-01-18 LAB — PHOSPHORUS: Phosphorus: 2.9 mg/dL (ref 2.5–4.6)

## 2022-01-18 LAB — HEPARIN LEVEL (UNFRACTIONATED): Heparin Unfractionated: 0.29 IU/mL — ABNORMAL LOW (ref 0.30–0.70)

## 2022-01-18 LAB — PROCALCITONIN: Procalcitonin: 0.1 ng/mL

## 2022-01-18 LAB — MAGNESIUM: Magnesium: 2.4 mg/dL (ref 1.7–2.4)

## 2022-01-18 MED ORDER — FUROSEMIDE 10 MG/ML IJ SOLN
60.0000 mg | Freq: Once | INTRAMUSCULAR | Status: AC
  Administered 2022-01-18: 60 mg via INTRAVENOUS
  Filled 2022-01-18: qty 6

## 2022-01-18 MED ORDER — MEGESTROL ACETATE 400 MG/10ML PO SUSP
400.0000 mg | Freq: Every day | ORAL | Status: DC
  Administered 2022-01-18 – 2022-01-19 (×2): 400 mg
  Filled 2022-01-18 (×2): qty 10

## 2022-01-18 MED ORDER — FUROSEMIDE 10 MG/ML IJ SOLN: 40.0000 mg | Freq: Once | INTRAMUSCULAR | Status: DC

## 2022-01-18 MED ORDER — MIDODRINE HCL 5 MG PO TABS
5.0000 mg | ORAL_TABLET | Freq: Three times a day (TID) | ORAL | Status: DC
  Administered 2022-01-18 – 2022-01-19 (×3): 5 mg via ORAL
  Filled 2022-01-18 (×3): qty 1

## 2022-01-18 MED ORDER — TORSEMIDE 20 MG PO TABS: 20.0000 mg | ORAL_TABLET | Freq: Every day | ORAL | Status: DC

## 2022-01-18 MED ORDER — INSULIN ASPART 100 UNIT/ML IJ SOLN
0.0000 [IU] | Freq: Four times a day (QID) | INTRAMUSCULAR | Status: DC
  Administered 2022-01-18 – 2022-01-19 (×2): 1 [IU] via SUBCUTANEOUS

## 2022-01-18 MED ORDER — STERILE WATER FOR INJECTION IV SOLN
INTRAVENOUS | Status: AC
  Filled 2022-01-18: qty 598.4

## 2022-01-18 NOTE — TOC Initial Note (Addendum)
Transition of Care [facility]) - Initial/Assessment Note    Patient Details  Name: [name] MRN: [id number] Date of Birth: 12/08/1940  Transition of Care [facility]) CM/SW Contact:    Erenest Rasher, RN Phone Number: [phone] 01/18/2022, 4:20 PM  Clinical Narrative:                 HF TOC CM spoke to pt and husband at bedside. Husband is requesting IP rehab or [place]. Waiting PT/OT recommended at home. Pt has cane at home. Husband declines SNF adamantly, she was there in the past. And they were not satisfied.   Expected Discharge Plan: IP Rehab Facility Barriers to Discharge: Continued Medical Work up   Patient Goals and CMS Choice Patient states their goals for this hospitalization and ongoing recovery are:: would like for pt to get stronger CMS Medicare.gov Compare Post Acute Care list provided to:: Patient Represenative (must comment) (Husband) Choice offered to / list presented to : Spouse  Expected Discharge Plan and Services Expected Discharge Plan: [place]   Discharge Planning Services: CM Consult Post Acute Care Choice: IP Rehab Living arrangements for the past 2 months: Single Family Home                                      Prior Living Arrangements/Services Living arrangements for the past 2 months: Single Family Home Lives with:: Spouse Patient language and need for interpreter reviewed:: Yes Do you feel safe going back to the place where you live?: Yes      Need for Family Participation in Patient Care: Yes (Comment) Care giver support system in place?: Yes (comment) Current home services: DME (cane, oxygen ([employer])) Criminal Activity/Legal Involvement Pertinent to Current Situation/Hospitalization: No - Comment as needed  Activities of Daily Living Home Assistive Devices/Equipment: Eyeglasses ADL Screening (condition at time of admission) Patient's cognitive ability adequate to safely complete daily activities?: Yes Is the patient  deaf or have difficulty hearing?: No Does the patient have difficulty seeing, even when wearing glasses/contacts?: Yes Does the patient have difficulty concentrating, remembering, or making decisions?: No Patient able to express need for assistance with ADLs?: Yes Does the patient have difficulty dressing or bathing?: No Independently performs ADLs?: Yes (appropriate for developmental age) Does the patient have difficulty walking or climbing stairs?: No Weakness of Legs: None Weakness of Arms/Hands: None  Permission Sought/Granted Permission sought to share information with : Case Manager, Family Supports, PCP Permission granted to share information with : Yes, Verbal Permission Granted  Share Information with NAME: Venna Berberich     Permission granted to share info w Relationship: husband  Permission granted to share info w Contact Information: [phone]  Emotional Assessment Appearance:: Appears stated age Attitude/Demeanor/Rapport: Engaged Affect (typically observed): Accepting Orientation: : Oriented to Self, Oriented to Place, Oriented to  Time, Oriented to Situation   Psych Involvement: No (comment)  Admission diagnosis:  Hypotension, unspecified hypotension type [I95.9] Acute on chronic systolic (congestive) heart failure (HCC) [I50.23] Atrial fibrillation, unspecified type [facility]) [I48.91] Patient Active Problem List   Diagnosis Date Noted   Severe mitral insufficiency    Cardiogenic shock (HCC)    Chronic diarrhea 01/15/2022   S/P mitral valve clip implantation 01/30/2022   Protein-calorie malnutrition, severe 01/11/2022   Pressure injury of skin 01/11/2022   AKI (acute kidney injury) ([place]) 01/04/2022   Acute on chronic systolic (congestive) heart failure (  [place]) 12/12/2021   Chronic respiratory failure with hypoxia (HCC) 12/17/2021   SOB (shortness of breath) 12/09/2021   COVID-19 virus infection 08/13/2021   Allergic reaction 07/19/2021   Cheek mass 05/20/2021    Pre-operative clearance 09/08/2020   Erosive osteoarthritis of both hands 10/17/2018   Arthritis of finger 04/26/2018   Arthritis of midtarsal joint of right foot 04/16/2018   TIA (transient ischemic attack)    Status post dilation of esophageal narrowing    Shingles    PVC's (premature ventricular contractions)    Orthostatic hypotension    NICM (nonischemic cardiomyopathy) (HCC)    Nephrolithiasis    Mild CAD    HTN (hypertension)    Ischemic colon ([place])    History of kidney stones    Hiatal hernia    Esophageal stricture    Cataract    Chronic combined systolic and diastolic CHF (congestive heart failure) (HCC)    Bradycardia    Atrial septal aneurysm    Anxiety    Weakness of right hip 11/16/2017   Stress fracture of ankle, initial encounter 11/16/2017   CKD (chronic kidney disease) stage 3, GFR 30-59 ml/min (HCC) 10/13/2017   Hoarseness 09/04/2017   Posterior tibial tendon dysfunction 08/28/2017   Drug hypersensitivity 06/22/2017   Lobar pneumonia, unspecified organism ([place]) 06/02/2017   PHN (postherpetic neuralgia) 04/16/2017   Abnormal TSH 04/12/2017   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[place]) 10/20/2016   Chronic atrial fibrillation ([place]) 09/17/2016   History of CVA (cerebrovascular accident) 09/14/2016   On home oxygen therapy 09/14/2016   Postoperative examination 09/14/2016   Acute on chronic combined systolic and diastolic CHF (congestive heart failure) ([place]) 08/17/2016   Syncope 08/10/2016   COPD (chronic obstructive pulmonary disease) ([place]) 08/10/2016   Chronic pain syndrome    Atrial fibrillation with rapid ventricular response ([place]) 07/27/2016   Acute respiratory failure with hypoxia ([place]) 07/27/2016   Combined congestive systolic and diastolic heart failure ([place]) 07/27/2016   HLD (hyperlipidemia) 07/27/2016   Insomnia 07/27/2016   Depression with anxiety 07/27/2016   Persistent atrial fibrillation ([place]) 07/27/2016   Atrial fibrillation ([place]) 07/27/2016   Essential  hypertension 07/27/2016   Gluteus medius tear 03/15/2016   Hyperglycemia 11/12/2015   Stricture and stenosis of esophagus 03/03/2014   Right wrist pain 12/19/2013   Swelling, mass, or lump in chest 12/19/2013   Depression 11/29/2013   Dysphagia 09/30/2013   Lung nodule 08/01/2013   Special screening for malignant neoplasms, colon 03/20/2013   Screen for colon cancer 03/20/2013   Lower back pain 01/21/2013   COPD exacerbation ([place]) 09/05/2012   Encounter for long-term (current) use of high-risk medication 02/13/2012   Sleep related leg cramps 10/01/2011   Basal cell carcinoma    Osteoarthritis    DI (detrusor instability)    Preventative health care 08/07/2011   Transient cerebral ischemia 03/25/2009   FATIGUE 06/05/2008   Allergic rhinitis 04/24/2008   History of urinary stone 10/05/2007   RECTOCELE WITHOUT MENTION OF UTERINE PROLAPSE 10/04/2007   BLADDER SUSPENSION, HX OF 10/04/2007   Herniation of rectum into vagina 10/04/2007   HEMORRHOIDS 08/11/2007   Gold B Copd with acute bronchitis 08/11/2007   GERD 08/11/2007   History of cardiovascular disorder 08/11/2007   Hyperlipidemia 08/11/2007   Irritable bowel syndrome 08/11/2007   Hemorrhoids 08/11/2007   Stroke ([place]) 12/06/1999   PCP:  Biagio Borg, MD Pharmacy:   [facility] [address] [place] [address] Phone: [phone] Fax: [phone]  Social Determinants of Health (SDOH) Interventions    Readmission Risk Interventions No flowsheet data found.

## 2022-01-18 NOTE — Progress Notes (Signed)
NAME:  [name], MRN:  [id number], DOB:  [birth year], LOS: 80 ADMISSION DATE:  01/01/2022, CONSULTATION DATE:  01/11/2022 REFERRING MD:  , CHIEF COMPLAINT:  Respiratory Failure   History of Present Illness:  81 year old woman with hx of NICM, mitral failure, SSS, afib on AC, COPD who presented with decompensated heart failure.  After volume and inotrope optimization she underwent mitraclip 2/9 and remains intubated post procedure.  PCCM consulted for further management.  Pertinent  Medical History   has a past medical history of Allergic rhinitis, Anxiety, Atrial septal aneurysm, Basal cell carcinoma, Bradycardia, Cataract, Chronic combined systolic and diastolic CHF (congestive heart failure) ([place]), CKD (chronic kidney disease), stage III ([place]), Clostridium difficile infection, COPD (chronic obstructive pulmonary disease) ([place]), Depression (11/29/2013), DI (detrusor instability), Esophageal stricture, Hemorrhoids, Hiatal hernia, History of kidney stones, HLD (hyperlipidemia), HTN (hypertension), Ischemic colon ([place]), Lung nodule, Mild CAD, Mitral regurgitation, Nephrolithiasis, NICM (nonischemic cardiomyopathy) ([place]), Orthostatic hypotension, Osteoarthritis, PAF (paroxysmal atrial fibrillation) ([place]), Pneumonia (2013), PVC's (premature ventricular contractions), S/P mitral valve clip implantation (01/09/2022), Shingles (since Nov 18, 2013), Status post dilation of esophageal narrowing, Stroke ([place]) (2001), Syncope, and TIA (transient ischemic attack) (last 2001).  Significant Hospital Events: Including procedures, antibiotic start and stop dates in addition to other pertinent events   mitraclip 2/9 TEE 2/9 2/10 extubated 2/12 TPN started  Interim History / Subjective:  No events overnight. Ambulating well with PT.   Objective   Blood pressure (!) 86/54, pulse (!) 106, temperature 97.6 F (36.4 C), temperature source Oral, resp. rate (!) 22, height 5\' 4"  (1.626 m), weight 57.5 kg, SpO2 95  %. CVP:  [8 mmHg] 8 mmHg      Intake/Output Summary (Last 24 hours) at 01/18/2022 0901 Last data filed at 01/18/2022 0700 Gross per 24 hour  Intake 2860.06 ml  Output 1650 ml  Net 1210.06 ml    Filed Weights   01/16/22 0500 01/17/22 0500 01/18/22 0600  Weight: 55.6 kg 55.6 kg 57.5 kg    Examination:  General: Frail elderly female in NAD HENT: [place]/AT, cortrak Lungs: Mildly tachypneic aftet ambulating in hall. No distress.  Cardiovascular: IRIR, rates 110. No edema.  Abdomen: Soft, non-tender, non-distended Extremities: No acute deformity Neuro: somnolent, but easily arouses to verbal.   Ancillary tests personally reviewed:    Echo shows biventricular failure with LVEF < 20% I/O + 1.7L last 24 hours.   Assessment & Plan:   Cardiogenic shock due to decompensated HFrEF from NICM Severe MR now status post mitral clip.  - Continue milrinone - Titrate NE to keep MAP >65 - Can likely begin to transition away from milrinone which should decrease NE demand. Will defer to heart failure service. Midodrine added.   Persistent AF - Amiodarone   Acute on chronic respiratory insufficiency due to COPD RUL PNA - Cefepime for suspected aspiration event 2/12 > 2/18.  - Wean O2 to keep sat > 92%  Hyponatremia due to volume contraction.  127 -s/p tolvaptan 1/11 -Free water restrict -Trend chemistry  Chronic diarrhea status post partial colectomy.  - Colestid and immodium for diarrhea.  - Chronic diarrhea workup sent.   Severe Malnutrition with cachexia related to HF GERD with prior peptic stricture - PPI - TPN currenlty at 60% caloric need > increase to 100% - Cortrak in place.  Increase from trickle tomorrow. If can tolerate goal feeds for 48 hours we can DC TPN.   Deconditioning - PT   Best Practice (right click and "Reselect  all SmartList Selections" daily)   Diet/type: continue tube feeds and introduce oral intake DVT prophylaxis: systemic heparin GI prophylaxis:  N/A Lines: yes and it is still needed Foley:  N/A Code Status:  full code Last date of multidisciplinary goals of care discussion [patient and husband updated at the bedside.]  Critical care time:  40 minutes  Georgann Housekeeper, AGACNP-BC [place] for personal pager PCCM on call pager [phone] until 7pm. Please call Elink 7p-7a. [phone]  01/18/2022 9:01 AM

## 2022-01-18 NOTE — Progress Notes (Signed)
PHARMACY - TOTAL PARENTERAL NUTRITION CONSULT NOTE  Indication: Intolerance to EN  Patient Measurements: Height: 5\' 4"  (162.6 cm) Weight: 57.5 kg (126 lb 12.2 oz) IBW/kg (Calculated) : 54.7 TPN AdjBW (KG): 52.2 Body mass index is 21.76 kg/m. Usual weight 54-56 kg  Assessment:  80 YOF presented 1/30 with HF exacerbation.  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  Patient has been eating poorly since admission.  She was \\started  on nocturnal TF on 2/6, cortrak dislodged on 2/9 after Mitraclip procedure, then continuous TF resumed on 2/10 post extubation.  She was having nausea so TF held 2/12.  Pharmacy consulted to dose TPN.  Cortrak replaced 2/13, trickle feeds started.   Glucose / Insulin: no hx DM - A1C 5.2; CBGs < 180 Used 5 units sSSI in past 24 hrs (High BG off BMP is likely contaminated with TPN since all other POC BG checks are < 180)  Electrolytes: Na 127 ([place] 15mg  on 2/11, max Na in TPN), low CL 90, CoCa 10.22 others WNL Renal: SCr 1.47 down, BUN 28 up  GI/Hepatic: On colestipol (home med) and megestrol for appetite; LFTs / tbili WNL, prealbumin 11.1 on 2/6, albumin 2.1, TG 70 Intake / Output; MIVF: UOP 1.4 ml/kg/hr without Diamox, net +11.7 L (weight 52.2 > 57.5 kg likely d/t volume), no further diarrhea GI Imaging: only abd XRs to confirm tube placement GI Surgeries / Procedures: N/A  Central access: PICC placed 01/05/22 TPN start date: 01/16/22  Nutritional Goals:  RD Estimated Needs Total Energy Estimated Needs: 1700-1900 Total Protein Estimated Needs: 80-95 grams Total Fluid Estimated Needs: >/= 1.7 L  Current Nutrition:  FLD - 2/13- had 4 bowls of soup   2/14 - had half of grits and soup for breakfast  TF at 24ml/hr - tolerating per RN TPN  Plan:  Increase concentrated TPN to goal 55 ml/hr per CCM TPN provides 90 g AA and 1703 kCal, meeting 100% of patient's needs Electrolytes in  TPN: Continue Na 140mEq/L, Ca 3 mEq/L, max CL; Increase Phos 10 mmol/L; Decrease K 6 mEq/L; Remove Mg to 0 mEq/L Continue daily multivitamin PT (no MVI and trace elements in TPN) Decrease sensitive SSI to Q6 Hr F/U AM labs F/u TF advancement, PO intake and wean TPN as able   Benetta Spar, PharmD, BCPS, BCCP Clinical Pharmacist  Please check AMION for all [place] phone numbers After 10:00 PM, call [place]

## 2022-01-18 NOTE — Progress Notes (Addendum)
Advanced Heart Failure Rounding Note  PCP-Cardiologist: Will Meredith Leeds, MD   Subjective:    2/1 A fib RVR--> Started on amio drip + heparin drip. PICC placed. Diuretics held.  2/2 RHC/LHC - Had cath with preserved cardiac output, min nonob CAD, elevated PCWP suggestive of severe MR.  2/4 TEE/DC-CV  LVEF 15% RV severely HK. DC-CV to NSR 2/7 Milrinone added for low co-ox (50%). 2/09 s/p Mitraclip A2/P2 2/10 Echo EF < 20% RV moderately decreased MV clip ok. Mild to mod MR.  2/12 Started on abx for RUL PNA  Remains on milrinone 0.125 + NE 12. Co-ox 67%. No a-line. Cuff SBPs low 90s.   Has been off diuretics w/ low volume status. Wt now trending up, up 4 lb. PO intake picking up. Ate several bowls of soup yesterday. CVP 8 today   Scr 1.62>>1.47   Na 127 on BMP, glucose 303. Corrected Na 132.  Ambulated w/ PT today. Still weak but feels but feels like she is improving. Still SOB, on supp O2.    Objective:   Weight Range: 57.5 kg Body mass index is 21.76 kg/m.   Vital Signs:   Temp:  [97.4 F (36.3 C)-98.4 F (36.9 C)] 97.6 F (36.4 C) (02/14 0700) Pulse Rate:  [93-110] 106 (02/14 0700) Resp:  [13-29] 22 (02/14 0700) BP: (71-109)/(50-90) 86/54 (02/14 0700) SpO2:  [86 %-100 %] 95 % (02/14 0753) Weight:  [57.5 kg] 57.5 kg (02/14 0600) Last BM Date : 01/16/22  Weight change: Filed Weights   01/16/22 0500 01/17/22 0500 01/18/22 0600  Weight: 55.6 kg 55.6 kg 57.5 kg    Intake/Output:   Intake/Output Summary (Last 24 hours) at 01/18/2022 0819 Last data filed at 01/18/2022 0700 Gross per 24 hour  Intake 2860.06 ml  Output 1650 ml  Net 1210.06 ml      Physical Exam   CVP 8  General:  thin frail appearing. No respiratory difficulty HEENT: normal + cortrak  Neck: supple. JVD 8 cm. Carotids 2+ bilat; no bruits. No lymphadenopathy or thyromegaly appreciated. Cor: PMI nondisplaced. Irregularly irregular rhythm. No rubs, gallops or murmurs. Lungs: bilateral  crackles R>L  Abdomen: soft, nontender, nondistended. No hepatosplenomegaly. No bruits or masses. Good bowel sounds. Extremities: no cyanosis, clubbing, rash, trace b/l ankle edema Neuro: alert & oriented x 3, cranial nerves grossly intact. moves all 4 extremities w/o difficulty. Affect pleasant.    Telemetry   Atrial fibrillation 90s-low 100s, occasional PVCs Personally reviewed   Labs    CBC Recent Labs    01/17/22 0424 01/18/22 0414  WBC 8.8 8.2  NEUTROABS 6.5 6.0  HGB 10.0* 9.8*  HCT 31.7* 31.3*  MCV 86.4 86.9  PLT 144* 643*   Basic Metabolic Panel Recent Labs    01/17/22 0424 01/17/22 0907 01/18/22 0414  NA 120* 128* 127*  K 3.9 4.2 4.5  CL 83* 90* 90*  CO2 29 30 27   GLUCOSE 423* 136* 303*  BUN 22 25* 28*  CREATININE 1.62* 1.62* 1.47*  CALCIUM 8.3* 8.8* 8.7*  MG 2.2  --  2.4  PHOS 3.7  --  2.9   Liver Function Tests Recent Labs    01/16/22 1330 01/17/22 0424  AST 14* 12*  ALT 9 10  ALKPHOS 68 66  BILITOT 0.4 0.2*  PROT 5.1* 4.7*  ALBUMIN 2.3* 2.1*    No results for input(s): LIPASE, AMYLASE in the last 72 hours. Cardiac Enzymes No results for input(s): CKTOTAL, CKMB, CKMBINDEX, TROPONINI in the last  72 hours.  BNP: BNP (last 3 results) Recent Labs    12/28/2021 1737 01/15/22 0950  BNP 2,812.3* 1,297.4*    ProBNP (last 3 results) No results for input(s): PROBNP in the last 8760 hours.   D-Dimer No results for input(s): DDIMER in the last 72 hours. Hemoglobin A1C No results for input(s): HGBA1C in the last 72 hours. Fasting Lipid Panel Recent Labs    01/17/22 0424  TRIG 70   Thyroid Function Tests No results for input(s): TSH, T4TOTAL, T3FREE, THYROIDAB in the last 72 hours.  Invalid input(s): FREET3   Other results:   Imaging    DG Abd Portable 1V  Result Date: 01/17/2022 CLINICAL DATA:  Feeding tube placement EXAM: PORTABLE ABDOMEN - 1 VIEW COMPARISON:  Is radiograph 01/14/2022 FINDINGS: Feeding tube extends into the  stomach. Tip is in the first portion the duodenum. No bowel obstruction identified. Posterior lumbar fusion. IMPRESSION: Feeding tip with tube in the gastric antrum versus first portion duodenum. Electronically Signed   By: Suzy Bouchard M.D.   On: 01/17/2022 11:01     Medications:     Scheduled Medications:  arformoterol  15 mcg Nebulization BID   budesonide (PULMICORT) nebulizer solution  0.25 mg Nebulization BID   chlorhexidine  15 mL Mouth Rinse BID   Chlorhexidine Gluconate Cloth  6 each Topical Daily   colestipol  1 g Per Tube BID   fluticasone  1 spray Each Nare BID   hydroxychloroquine  400 mg Per Tube QHS   insulin aspart  0-9 Units Subcutaneous Q6H   levothyroxine  25 mcg Per Tube QAC breakfast   loratadine  10 mg Per Tube Daily   mouth rinse  15 mL Mouth Rinse q12n4p   megestrol  400 mg Per Tube Daily   montelukast  10 mg Per Tube Daily   multivitamin with minerals  1 tablet Per Tube Daily   pantoprazole (PROTONIX) IV  40 mg Intravenous QHS   revefenacin  175 mcg Nebulization Daily   sodium chloride flush  10-40 mL Intracatheter Q12H   sodium chloride flush  3 mL Intravenous Q12H   sodium chloride flush  3 mL Intravenous Q12H    Infusions:  sodium chloride Stopped (01/14/22 0519)   sodium chloride     amiodarone 30 mg/hr (01/18/22 0700)   ceFEPime (MAXIPIME) IV Stopped (01/17/22 1253)   feeding supplement (VITAL 1.5 CAL) 1,000 mL (01/17/22 1300)   heparin 1,300 Units/hr (01/18/22 0700)   milrinone 0.125 mcg/kg/min (01/18/22 0700)   norepinephrine (LEVOPHED) Adult infusion 12 mcg/min (01/18/22 0700)   TPN ADULT (ION) 35 mL/hr at 01/18/22 0700    PRN Medications: sodium chloride, sodium chloride, acetaminophen, ALPRAZolam, guaiFENesin-dextromethorphan, hydrALAZINE, ipratropium, labetalol, levalbuterol, melatonin, ondansetron (ZOFRAN) IV, phenol, sodium chloride flush, sodium chloride flush, sodium chloride flush, traZODone    Patient Profile   [name] is a  81 year old with a history of PAF, moderate AS, severe MR, COPD, quit smoking 25 years ago, PAD, HTN, hyperlipidemia, CVA, NICM, tobacco abuse, ischemic bowel with resection in 2017,11/04/22 left ureteral calculus S/P utreteroscopy and stent,  and chronic HFrEF.     Admitted A/C HFrEF and A fib RVR.   Assessment/Plan   A/C HFrEF - Echo this admit EF down to 25% from previous Echo EF 40-45% in 2018 .  BNP on admit > 2800.  - Has LBBB, may eventually benefit from CRT depending on progress - Cath 2/2 with minimal CAD preserved cardiac output, elevated PCWP (intermittent v-waves up  to 50) suggestive of severe MR.  - TEE 2/3 LVEF 15% RV severe HK. Severe MR - S/p mitral valve repair with mitraclip 02/09 - Echo 2/10 EF < 20% significant LV dyssynchrony due to LBBB. RV moderately decreased MV clip ok. Mild to mod MR. Personally reviewed - on Milrinone 0.125 + 12 NE. Co-ox 67%.  - Stop milrinone today and follow co-ox. - Continue to wean NE Keep MAP > 65 < 80. Add midodrine 5 tid to facilitate wean  - CVP and wt trending up. CVP 8-9. Add Torsemide 20 mg daily  - Continue abx for RUL PNA - GDMT limited by need for NE - Echo with significant LV dyssynchrony. Suspect LBBB may be playing significant role here.    2. Severe MR  - 12/28/21 TEE with severe MR - s/p mitraclip A2/P2 02/09 - TEE post procedure with mild to moderate residual MR - Echo 2/10 EF < 20% significant LV dyssynchrony due to LBBB. RV moderately decreased MV clip ok. Mild to mod MR. Personally reviewed   3. Persistent AF - Last dose of eliquis 01/02/2022 (morning) -> heparin - s/p DC-CV 2/3 - Back in Afib, V-rates 90-100s - On amio gtt at 30. Transition to PO once off milrinone, though long term not ideal w/ pulmonary issues   - Plan repeat DC-CV this week as able   4. AKI  - Recently had diuretics increased on 1/5. Creatinine on that time was 1.2.  - On admit creatinine was 1.7 - SCr 1.5-> 1.6->1.5 today.  - follow BMP    5.  A/C Respiratory Failure with RUL PNA - On exam has severe underlying COPD - Volume status ok/low.  - CXR c/w RUL PNA.  PCT <0.10. Continue abx. D/w CCM at bedisdie   6. Anemia  - Hgb 9.8. Iron Sats 7%.  - Received feraheme 02/03  7. Hypokalemia/hypomagnesemia - stable today, K 4.5, Mg 2.4  - supp as needed  8. 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 - Cor-trak moved post-pyloric - appetite is picking up   9. Severe deconditioning - PT/OT following  10. Hyponatremia - Rec'd tolvaptan 2/11 - Corrected Na 132 today    Length of Stay: [address], PA-C  01/18/2022, 8:19 AM  Advanced Heart Failure Team Pager [phone] (M-F; 7a - 5p)  Please contact [facility] for night-coverage after hours (5p -7a ) and weekends on [email or web address]  Patient seen with PA, agree with the above note.   Anxious today.  Did not sleep well.  CVP 10 on my read. Co-ox 67% on NE 12, milrinone 0.125. Creatinine stable 1.47.  Remains in AF 90s-100s.   General: NAD Neck: JVP 9-10 cm, no thyromegaly or thyroid nodule.  Lungs: Decreased at bases.  CV: Nondisplaced PMI.  Heart irregular S1/S2, no S3/S4, no murmur.  No peripheral edema.   Abdomen: Soft, nontender, no hepatosplenomegaly, no distention.  Skin: Intact without lesions or rashes.  Neurologic: Alert and oriented x 3.  Psych: Normal affect. Extremities: No clubbing or cyanosis.  HEENT: Normal.   Multilobar PNA last CXR, remains on cefepime.  Afebrile, WBCs 8.2.   Mild volume overload, good co-ox.  - Stop milrinone.  - Add midodrine 5 tid with slow wean of NE.  - Lasix 60 mg IV x 1 now, start po torsemide tomorrow.  She remains in atrial fibrillation rate 90s-100s on amiodarone gtt and heparin gtt.  - Will need eventual TEE- guided DCCV when stable  (would wait until respiratory status improves).   CRITICAL CARE Performed by: Loralie Champagne  Total critical care time: 35 minutes  Critical care time was exclusive of separately billable procedures and treating other patients.  Critical care was necessary to treat or prevent imminent or life-threatening deterioration.  Critical care was time spent personally by me on the following activities: development of treatment plan with patient and/or surrogate as well as nursing, discussions with consultants, evaluation of patient's response to treatment, examination of patient, obtaining history from patient or surrogate, ordering and performing treatments and interventions, ordering and review of laboratory studies, ordering and review of radiographic studies, pulse oximetry and re-evaluation of patient's condition.  Loralie Champagne 01/18/2022 2:01 PM

## 2022-01-18 NOTE — Progress Notes (Signed)
Occupational Therapy Treatment Patient Details Name: [name] MRN: [id number] DOB: 05/18/1941 Today's Date: 01/18/2022   History of present illness 81 y/o female who presents on 12/19/2021 with weakness, fluttering in chest and SOB. CXR- bil pulmonary edema. Found to have acute on chronic CHF, A-fib with RVR and severe mitral regurgitation.s/p cardioversion 2/3. Pt underwent Mitraclip on 2/9, remained intubated after procedure until extubation on 2/10. PMH includes PAF, moderate AS, COPD, quit smoking 25 years ago, PAD, HTN, CVA, NICM, tobacco abuse, ischemic bowel with resection in 2017,11/04/22 left ureteral calculus S/P utreteroscopy and stent and chronic HFrEF.   OT comments  Pt progressing towards established OT goals and is very motivated to participate in therapy. Pt presenting with decreased balance, strength, and activity tolerance. Pt requiring Min-Mod A for functional mobility in hallway with use of eva walker, cues for posture, and assistance to maintain balance. Also requiring standing rest breaks due to fatigue and drops in SpO2 on 4L. Pt performing toilet transfer with Mod A and peri care with Max A. Continue to recommend dc to home with HHOT and will continue to follow acutely as admitted.    Recommendations for follow up therapy are one component of a multi-disciplinary discharge planning process, led by the attending physician.  Recommendations may be updated based on patient status, additional functional criteria and insurance authorization.    Follow Up Recommendations  Home health OT    Assistance Recommended at Discharge Frequent or constant Supervision/Assistance  Patient can return home with the following  A lot of help with walking and/or transfers;A little help with bathing/dressing/bathroom;Assist for transportation;Assistance with cooking/housework   Equipment Recommendations  Tub/shower seat    Recommendations for Other Services      Precautions / Restrictions  Precautions Precautions: Fall;Other (comment) Precaution Comments: vitals, cortrak       Mobility Bed Mobility Overal bed mobility: Needs Assistance Bed Mobility: Sit to Supine       Sit to supine: Mod assist   General bed mobility comments: Mod A for managing BLEs    Transfers Overall transfer level: Needs assistance Equipment used:  (eva walker) Transfers: Sit to/from Stand Sit to Stand: Min assist, Mod assist           General transfer comment: Min A for to power from recliner. Mod A for power up from regular height toilet     Balance Overall balance assessment: Needs assistance Sitting-balance support: No upper extremity supported, Feet supported Sitting balance-[name]: Fair     Standing balance support: Bilateral upper extremity supported, Reliant on assistive device for balance, During functional activity Standing balance-[name]: Poor Standing balance comment: with decreased balance nad requiring Min-Mod A for maintaining balance                           ADL either performed or assessed with clinical judgement   ADL Overall ADL's : Needs assistance/impaired                         Toilet Transfer: Moderate assistance;Ambulation;Regular Toilet;Grab bars Toilet Transfer Details (indicate cue type and reason): Mod A for safe descent and then for power up. Toileting- Clothing Manipulation and Hygiene: Maximal assistance Toileting - Clothing Manipulation Details (indicate cue type and reason): Max A for peri care     Functional mobility during ADLs: Moderate assistance;+2 for safety/equipment (eva walker) General ADL Comments: pt performing mobiliyt in hallway and requiring Mod  A for upright posture, walker management, and maintaining balance.    Extremity/Trunk Assessment Upper Extremity Assessment Upper Extremity Assessment: Generalized weakness   Lower Extremity Assessment Lower Extremity Assessment: Defer to PT  evaluation        Vision       Perception     Praxis      Cognition Arousal/Alertness: Awake/alert Behavior During Therapy: WFL for tasks assessed/performed Overall Cognitive Status: Impaired/Different from baseline Area of Impairment: Problem solving                             Problem Solving: Slow processing, Requires verbal cues General Comments: Pt requiring increased time for processing with increased cues for safety and sequencing. Highly motivated        Exercises      Shoulder Instructions       General Comments SpO2 dropping into the 80s with activity on 4L O2. cues for purse lip breathing.    Pertinent Vitals/ Pain       Pain Assessment Pain Assessment: Faces Faces Pain Scale: Hurts little more Pain Location: movement of cortrak Pain Descriptors / Indicators: Discomfort, Grimacing Pain Intervention(s): Monitored during session  Home Living                                          Prior Functioning/Environment              Frequency  Min 3X/week        Progress Toward Goals  OT Goals(current goals can now be found in the care plan section)  Progress towards OT goals: Progressing toward goals  Acute Rehab OT Goals OT Goal Formulation: With patient Time For Goal Achievement: 01/24/22 Potential to Achieve Goals: Good ADL Goals Pt Will Perform Grooming: with modified independence;standing  Plan Discharge plan remains appropriate    Co-evaluation                 AM-PAC OT "6 Clicks" Daily Activity     Outcome Measure   Help from another person eating meals?: None Help from another person taking care of personal grooming?: A Little Help from another person toileting, which includes using toliet, bedpan, or urinal?: A Little Help from another person bathing (including washing, rinsing, drying)?: A Little Help from another person to put on and taking off regular upper body clothing?: A Little Help  from another person to put on and taking off regular lower body clothing?: A Lot 6 Click Score: 18    End of Session Equipment Utilized During Treatment: Oxygen;Other (comment) (eva walker)  OT Visit Diagnosis: Unsteadiness on feet (R26.81)   Activity Tolerance Patient tolerated treatment well   Patient Left in bed;with family/visitor present;with call bell/phone within reach;with nursing/sitter in room   Nurse Communication Mobility status        Time: 5366-4403 OT Time Calculation (min): 23 min  Charges: OT General Charges $OT Visit: 1 Visit OT Treatments $Self Care/Home Management : 8-22 mins $Therapeutic Activity: 8-22 mins  Jillana Selph MSOT, OTR/L Acute Rehab Pager: [phone] Office: [place] 01/18/2022, 9:25 AM

## 2022-01-18 NOTE — Progress Notes (Signed)
ANTICOAGULATION CONSULT NOTE - Follow Up Consult  Pharmacy Consult for heparin Indication: atrial fibrillation  Labs: Recent Labs    01/16/22 0447 01/16/22 0600 01/16/22 0945 01/17/22 0424 01/17/22 0907 01/18/22 0414  HGB 9.9*  --   --  10.0*  --  9.8*  HCT 31.6*  --   --  31.7*  --  31.3*  PLT 102*  --   --  144*  --  131*  HEPARINUNFRC  --   --  0.34  --  0.52 0.29*  CREATININE 1.51*   < >  --  1.62* 1.62* 1.47*   < > = values in this interval not displayed.    Assessment: 81yo female who was on apix PTA 2.5mg  BID for a fib that was left off med rec and not continued on admit to ED on 1/30. Patient underwent TEE/DCCV 2/3 now s/p MitraClip 2/9.   Heparin level this morning is just below goal on 1300 units/hr. No infusion issues or s/sx of bleeding noted. Pltc stable today - watch closely.  Goal of Therapy:  Heparin level 0.3-0.7 units/ml   Plan:  Increase IV heparin at 1350 units/hr Daily heparin level and CBC while on heparin  Erin Hearing PharmD., BCPS Clinical Pharmacist 01/18/2022 8:43 AM

## 2022-01-19 ENCOUNTER — Inpatient Hospital Stay (HOSPITAL_COMMUNITY): Payer: Medicare Other

## 2022-01-19 DIAGNOSIS — N179 Acute kidney failure, unspecified: Secondary | ICD-10-CM | POA: Diagnosis not present

## 2022-01-19 DIAGNOSIS — J810 Acute pulmonary edema: Secondary | ICD-10-CM

## 2022-01-19 DIAGNOSIS — J9601 Acute respiratory failure with hypoxia: Secondary | ICD-10-CM | POA: Diagnosis not present

## 2022-01-19 DIAGNOSIS — I5043 Acute on chronic combined systolic (congestive) and diastolic (congestive) heart failure: Secondary | ICD-10-CM | POA: Diagnosis not present

## 2022-01-19 LAB — CBC WITH DIFFERENTIAL/PLATELET
Abs Immature Granulocytes: 0.04 10*3/uL (ref 0.00–0.07)
Basophils Absolute: 0.1 10*3/uL (ref 0.0–0.1)
Basophils Relative: 1 %
Eosinophils Absolute: 0.1 10*3/uL (ref 0.0–0.5)
Eosinophils Relative: 1 %
HCT: 30.1 % — ABNORMAL LOW (ref 36.0–46.0)
Hemoglobin: 9.5 g/dL — ABNORMAL LOW (ref 12.0–15.0)
Immature Granulocytes: 1 %
Lymphocytes Relative: 6 %
Lymphs Abs: 0.4 10*3/uL — ABNORMAL LOW (ref 0.7–4.0)
MCH: 30.4 pg (ref 26.0–34.0)
MCHC: 31.6 g/dL (ref 30.0–36.0)
MCV: 96.2 fL (ref 80.0–100.0)
Monocytes Absolute: 1.1 10*3/uL — ABNORMAL HIGH (ref 0.1–1.0)
Monocytes Relative: 14 %
Neutro Abs: 5.8 10*3/uL (ref 1.7–7.7)
Neutrophils Relative %: 77 %
Platelets: 106 10*3/uL — ABNORMAL LOW (ref 150–400)
RBC: 3.13 MIL/uL — ABNORMAL LOW (ref 3.87–5.11)
RDW: 19 % — ABNORMAL HIGH (ref 11.5–15.5)
WBC: 7.4 10*3/uL (ref 4.0–10.5)
nRBC: 0 % (ref 0.0–0.2)

## 2022-01-19 LAB — BASIC METABOLIC PANEL
Anion gap: 11 (ref 5–15)
BUN: 38 mg/dL — ABNORMAL HIGH (ref 8–23)
CO2: 24 mmol/L (ref 22–32)
Calcium: 8.4 mg/dL — ABNORMAL LOW (ref 8.9–10.3)
Chloride: 93 mmol/L — ABNORMAL LOW (ref 98–111)
Creatinine, Ser: 1.42 mg/dL — ABNORMAL HIGH (ref 0.44–1.00)
GFR, Estimated: 37 mL/min — ABNORMAL LOW (ref >60)
Glucose, Bld: 423 mg/dL — ABNORMAL HIGH (ref 70–99)
Potassium: 4.1 mmol/L (ref 3.5–5.1)
Sodium: 128 mmol/L — ABNORMAL LOW (ref 135–145)

## 2022-01-19 LAB — GLUCOSE, CAPILLARY
Glucose-Capillary: 131 mg/dL — ABNORMAL HIGH (ref 70–99)
Glucose-Capillary: 146 mg/dL — ABNORMAL HIGH (ref 70–99)
Glucose-Capillary: 144 mg/dL — ABNORMAL HIGH (ref 70–99)
Glucose-Capillary: 145 mg/dL — ABNORMAL HIGH (ref 70–99)

## 2022-01-19 LAB — POCT I-STAT 7, (LYTES, BLD GAS, ICA,H+H)
Acid-Base Excess: 0 mmol/L (ref 0.0–2.0)
Bicarbonate: 28.2 mmol/L — ABNORMAL HIGH (ref 20.0–28.0)
Calcium, Ion: 1.35 mmol/L (ref 1.15–1.40)
HCT: 32 % — ABNORMAL LOW (ref 36.0–46.0)
Hemoglobin: 10.9 g/dL — ABNORMAL LOW (ref 12.0–15.0)
O2 Saturation: 91 %
Patient temperature: 96.4
Potassium: 4.1 mmol/L (ref 3.5–5.1)
Sodium: 130 mmol/L — ABNORMAL LOW (ref 135–145)
TCO2: 30 mmol/L (ref 22–32)
pCO2 arterial: 57.7 mmHg — ABNORMAL HIGH (ref 32–48)
pH, Arterial: 7.291 — ABNORMAL LOW (ref 7.35–7.45)
pO2, Arterial: 66 mmHg — ABNORMAL LOW (ref 83–108)

## 2022-01-19 LAB — COOXEMETRY PANEL
Carboxyhemoglobin: <2.2 % — ABNORMAL HIGH (ref 0.5–1.5)
Methemoglobin: <0.2 % (ref 0.0–1.5)
O2 Saturation: 64 %
Total hemoglobin: 8.6 g/dL — ABNORMAL LOW (ref 12.0–16.0)

## 2022-01-19 LAB — PHOSPHORUS: Phosphorus: 3.4 mg/dL (ref 2.5–4.6)

## 2022-01-19 LAB — HEPARIN LEVEL (UNFRACTIONATED)
Heparin Unfractionated: 0.16 IU/mL — ABNORMAL LOW (ref 0.30–0.70)
Heparin Unfractionated: 0.52 IU/mL (ref 0.30–0.70)

## 2022-01-19 LAB — COPPER, SERUM: Copper: 114 ug/dL (ref 80–158)

## 2022-01-19 LAB — MAGNESIUM: Magnesium: 2 mg/dL (ref 1.7–2.4)

## 2022-01-19 LAB — ZINC: Zinc: 52 ug/dL (ref 44–115)

## 2022-01-19 MED ORDER — MORPHINE BOLUS VIA INFUSION
2.0000 mg | INTRAVENOUS | Status: DC | PRN
  Filled 2022-01-19: qty 2

## 2022-01-19 MED ORDER — ALPRAZOLAM 0.25 MG PO TABS: 0.2500 mg | ORAL_TABLET | Freq: Three times a day (TID) | ORAL | Status: DC | PRN

## 2022-01-19 MED ORDER — GLYCOPYRROLATE 0.2 MG/ML IJ SOLN: 0.2000 mg | INTRAMUSCULAR | Status: DC | PRN

## 2022-01-19 MED ORDER — MORPHINE SULFATE (PF) 2 MG/ML IV SOLN
INTRAVENOUS | Status: AC
  Administered 2022-01-19: 2 mg via INTRAVENOUS
  Filled 2022-01-19: qty 2

## 2022-01-19 MED ORDER — FUROSEMIDE 10 MG/ML IJ SOLN
60.0000 mg | Freq: Two times a day (BID) | INTRAMUSCULAR | Status: DC
  Administered 2022-01-19 (×2): 60 mg via INTRAVENOUS
  Filled 2022-01-19 (×2): qty 6

## 2022-01-19 MED ORDER — SODIUM CHLORIDE 0.9 % IV SOLN
4.0000 mg/h | INTRAVENOUS | Status: DC
  Administered 2022-01-19: 4 mg/h via INTRAVENOUS
  Filled 2022-01-19: qty 5

## 2022-01-19 MED ORDER — STERILE WATER FOR INJECTION IV SOLN
INTRAVENOUS | Status: DC
  Filled 2022-01-19: qty 598.4

## 2022-01-19 MED ORDER — MORPHINE 100MG IN NS 100ML (1MG/ML) PREMIX INFUSION: 0.0000 mg/h | INTRAVENOUS | Status: DC

## 2022-01-19 MED ORDER — TRAZODONE HCL 50 MG PO TABS: 100.0000 mg | ORAL_TABLET | Freq: Every day | ORAL | Status: DC

## 2022-01-19 MED ORDER — MIRABEGRON ER 50 MG PO TB24
50.0000 mg | ORAL_TABLET | Freq: Every day | ORAL | Status: DC
  Filled 2022-01-19: qty 1

## 2022-01-19 MED ORDER — AMIODARONE HCL 200 MG PO TABS
200.0000 mg | ORAL_TABLET | Freq: Every day | ORAL | Status: DC
  Administered 2022-01-19: 200 mg via ORAL
  Filled 2022-01-19: qty 1

## 2022-01-19 MED ORDER — VITAL 1.5 CAL PO LIQD
1000.0000 mL | ORAL | Status: DC
  Administered 2022-01-19: 1000 mL

## 2022-01-19 MED ORDER — GLYCOPYRROLATE 1 MG PO TABS
1.0000 mg | ORAL_TABLET | ORAL | Status: DC | PRN
  Filled 2022-01-19: qty 1

## 2022-01-19 MED ORDER — POLYVINYL ALCOHOL 1.4 % OP SOLN: 1.0000 [drp] | Freq: Four times a day (QID) | OPHTHALMIC | Status: DC | PRN

## 2022-01-19 MED ORDER — MIDODRINE HCL 5 MG PO TABS: 10.0000 mg | ORAL_TABLET | Freq: Three times a day (TID) | ORAL | Status: DC

## 2022-01-19 MED ORDER — DIPHENHYDRAMINE HCL 50 MG/ML IJ SOLN: 25.0000 mg | INTRAMUSCULAR | Status: DC | PRN

## 2022-01-19 MED ORDER — STERILE WATER FOR INJECTION IV SOLN: INTRAVENOUS | Status: DC

## 2022-01-19 MED ORDER — MORPHINE SULFATE (PF) 2 MG/ML IV SOLN
2.0000 mg | Freq: Once | INTRAVENOUS | Status: AC
Start: 2022-01-19 — End: 2022-01-19
  Administered 2022-01-19: 2 mg via INTRAVENOUS
  Filled 2022-01-19: qty 1

## 2022-01-19 MED ORDER — VITAL 1.5 CAL PO LIQD: 1000.0000 mL | ORAL | Status: DC

## 2022-01-19 MED ORDER — INSULIN ASPART 100 UNIT/ML IJ SOLN
0.0000 [IU] | Freq: Three times a day (TID) | INTRAMUSCULAR | Status: DC
  Administered 2022-01-19: 1 [IU] via SUBCUTANEOUS

## 2022-01-19 MED ORDER — MELATONIN 5 MG PO TABS: 10.0000 mg | ORAL_TABLET | Freq: Every evening | ORAL | Status: DC | PRN

## 2022-01-19 MED ORDER — ACETAMINOPHEN 325 MG PO TABS: 650.0000 mg | ORAL_TABLET | Freq: Four times a day (QID) | ORAL | Status: DC | PRN

## 2022-01-19 MED ORDER — MIDAZOLAM HCL 2 MG/2ML IJ SOLN: 2.0000 mg | INTRAMUSCULAR | Status: DC | PRN

## 2022-01-19 MED ORDER — MIDAZOLAM HCL 2 MG/2ML IJ SOLN
INTRAMUSCULAR | Status: AC
  Administered 2022-01-19: 2 mg via INTRAVENOUS
  Filled 2022-01-19: qty 2

## 2022-01-19 MED ORDER — MIDODRINE HCL 5 MG PO TABS
10.0000 mg | ORAL_TABLET | Freq: Three times a day (TID) | ORAL | Status: DC
  Administered 2022-01-19 (×2): 10 mg
  Filled 2022-01-19 (×2): qty 2

## 2022-01-19 MED ORDER — ACETAMINOPHEN 650 MG RE SUPP: 650.0000 mg | Freq: Four times a day (QID) | RECTAL | Status: DC | PRN

## 2022-01-19 MED ORDER — MIDAZOLAM-SODIUM CHLORIDE 100-0.9 MG/100ML-% IV SOLN
5.0000 mg/h | INTRAVENOUS | Status: DC
  Administered 2022-01-19: 5 mg/h via INTRAVENOUS
  Filled 2022-01-19: qty 100

## 2022-01-19 MED ORDER — AMIODARONE HCL 200 MG PO TABS: 200.0000 mg | ORAL_TABLET | Freq: Every day | ORAL | Status: DC

## 2022-01-19 MED ORDER — MORPHINE SULFATE (PF) 4 MG/ML IV SOLN: 4.0000 mg | INTRAVENOUS | Status: DC | PRN

## 2022-01-19 MED ORDER — DEXMEDETOMIDINE HCL IN NACL 400 MCG/100ML IV SOLN
0.0000 ug/kg/h | INTRAVENOUS | Status: DC
  Filled 2022-01-19: qty 100

## 2022-01-19 MED ORDER — ALPRAZOLAM 0.25 MG PO TABS
0.2500 mg | ORAL_TABLET | Freq: Three times a day (TID) | ORAL | Status: DC | PRN
  Administered 2022-01-19: 0.25 mg via ORAL
  Filled 2022-01-19: qty 1

## 2022-01-20 LAB — VITAMIN B1: Vitamin B1 (Thiamine): 154.8 nmol/L (ref 66.5–200.0)

## 2022-01-21 LAB — VITAMIN C: Vitamin C: 0.9 mg/dL (ref 0.4–2.0)

## 2022-01-22 LAB — VITAMIN A: Vitamin A (Retinoic Acid): 35.2 ug/dL (ref 22.0–69.5)

## 2022-01-30 LAB — ECHO TEE
Area-P 1/2: 7.1 cm2
MV M vel: 3.01 m/s
MV Peak grad: 36.2 mmHg
Radius: 0.7 cm

## 2022-02-02 ENCOUNTER — Other Ambulatory Visit: Payer: Medicare Other

## 2022-02-02 NOTE — Progress Notes (Signed)
ANTICOAGULATION CONSULT NOTE - Follow Up Consult  Pharmacy Consult for heparin Indication: atrial fibrillation  Labs: Recent Labs    01/17/22 0424 01/17/22 0907 01/18/22 0414 Feb 09, 2022 0451 02-09-22 0600 02-09-2022 0849  HGB 10.0*  --  9.8* 9.5*  --  10.9*  HCT 31.7*  --  31.3* 30.1*  --  32.0*  PLT 144*  --  131* 106*  --   --   HEPARINUNFRC  --  0.52 0.29* 0.16*  --   --   CREATININE 1.62* 1.62* 1.47*  --  1.42*  --     Assessment: 81yo female who was on apix PTA 2.5mg  BID for a fib that was left off med rec and not continued on admit to ED on 1/30. Patient underwent TEE/DCCV 2/3 now s/p MitraClip 2/9.   Heparin level this afternoon is now at goal on 1450 units/hr. No infusion issues or s/sx of bleeding noted. Pltc 106- watch closely.  Goal of Therapy:  Heparin level 0.3-0.7 units/ml   Plan:  Continue IV heparin at 1450 units/hr Daily heparin level and CBC while on heparin  Erin Hearing PharmD., BCPS Clinical Pharmacist 09-Feb-2022 11:35 AM

## 2022-02-02 NOTE — Progress Notes (Addendum)
Nutrition Follow-up  DOCUMENTATION CODES:   Severe malnutrition in context of chronic illness  INTERVENTION:   Tube Feeding via Cortrak (post pyloric):  Increase Vital 1.5 to 30 ml/hr, goal rate of 50 ml/hr Tube feeding regimen at goal rate will provide 1800 kcal, 81 grams of protein, and 917 ml of H2O.  TPN order per Pharmacy -Per CCM, plan to continue TPN until pt demonstrating tolerance of TF at goal rate for at least 48 hours  Liberalize diet to REGULAR-encourage po when off Bipap  No oral nutrition supplements as pt reports increased diarrhea with all supplements  NUTRITION DIAGNOSIS:   Severe Malnutrition related to chronic illness (CHF, COPD) as evidenced by severe fat depletion, severe muscle depletion, percent weight loss (8.4% weight loss in less than 4 months).  Being addressed via nutrition support  GOAL:   Patient will meet greater than or equal to 90% of their needs  Progressing  MONITOR:   Diet advancement, Labs, Weight trends, TF tolerance, Skin, I & O's  REASON FOR ASSESSMENT:   Consult New TPN/TNA  ASSESSMENT:   81 year old female who presented to the ED on 1/30 with SOB. PMH of CHF, atrial fibrillation, COPD, HLD, anemia, GERD, CKD stage III, ischemic colon s/p hemicolectomy and ileostomy. Pt admitted with acute on chronic HF, AKI.  02/02 - s/p R/L heart cath 02/03 - s/p TEE-DCCV 02/06 - Cortrak placed (tip gastric), nocturnal tube feeds started 02/09 - s/p MitraClip, TEE post-procedure, Cortrak dislodged, OG tube placed 02/10 - extubated, Cortrak replaced (tip gastric) 02/12 - diet advanced to full liquids, TPN initiated 02/13 - Cortrak advanced to post-pyloric (tip in first portion of duodenum)  Requiring BiPap this AM due to worsening hypercarbic respiratory failure. Anxious overnight, not sleeping. More calm per RN post BiPap  Diet advanced to Heart Healthy yesterday afternoon from [facility]. Currently unable to take any po due to respiratory  status  Noted pt started on Megace on 2/14  Tolerating Vital 1.5 at 20 ml/hr via Cortrak-post pyloric  TPN at goal rate of 55 ml/hr-TPN provides 90g of protein, 1703 kcals and 1320 mL  No diarrhea today; last documented BM on 2/12  Current wt 57.5 kg; admit weight 52.2 kg.   Vitamin/Mineral Profile (01/17/22):  Thiamine B1: pending Vitamin B12: 783 (wdl) 01/16/22 Folate B9: 25.4 (wdl) Vitamin A: pending Vitamin D: 58.81 (wdl) Vitamin C: pending Copper: pending Zinc: pending CRP: 8.4 (H)  Labs: sodium 130 (L), CBGs 121-146 Meds: colestid, ss novolog, megace, MVI with Minerals, lasix  Diet Order:   Diet Order             Diet regular Room service appropriate? Yes; Fluid consistency: Thin  Diet effective now                   EDUCATION NEEDS:   Education needs have been addressed  Skin:  Skin Assessment: Skin Integrity Issues: Skin Integrity Issues:: Stage I, Other (Comment) Stage I: bilateral ischial tuberosity Incisions: - Other: pressure injury to buttocks  Last BM:  01/16/22  Height:   Ht Readings from Last 1 Encounters:  01/05/22 5\' 4"  (1.626 m)    Weight:   Wt Readings from Last 1 Encounters:  01/18/22 57.5 kg     BMI:  Body mass index is 21.76 kg/m.  Estimated Nutritional Needs:   Kcal:  1700-1900  Protein:  80-95 grams  Fluid:  >/= 1.7 L   Kerman Passey MS, RDN, LDN, CNSC Registered Dietitian III Clinical Nutrition RD Pager  and Forensic scientist Number Located in [place]

## 2022-02-02 NOTE — Progress Notes (Signed)
°  Family at bedside.  Ms. Eckhart continues to be hypoxic and uncomfortable.   She is resolute in her decision that she wants to be comfort care and her only request was to "make it quick" because she has been so uncomfortable.   Discussed that comfort care designed to alleviate suffering and not take her life away but that given her frail state she would be unlikely to last long once pressors stopped and comfort meds provided. She understood but asked that we would be sure that she wouldn't suffer.   Family at bedside agreeable.   Will start comfort drips. D/w PharmD and bedside RN.  Additional CCT 40 mins.   Glori Bickers, MD  8:16 PM

## 2022-02-02 NOTE — Plan of Care (Signed)
°  Problem: Clinical Measurements: Goal: Ability to maintain clinical measurements within normal limits will improve Outcome: Not Progressing Goal: Diagnostic test results will improve Outcome: Not Progressing Goal: Respiratory complications will improve Outcome: Not Progressing   Problem: Activity: Goal: Risk for activity intolerance will decrease Outcome: Not Progressing

## 2022-02-02 NOTE — Progress Notes (Signed)
Time of death 2108. No heart and lung sounds auscultated when examined for 2 minutes by Briscoe Deutscher, RN and Sharen Heck, RN.Dr. Haroldine Laws notified.

## 2022-02-02 NOTE — Progress Notes (Signed)
Physical Therapy Treatment Patient Details Name: [name] MRN: [id number] DOB: [birth year] Today's Date: 02/14/22   History of Present Illness 81 y/o female who presents on 12/20/2021 with weakness, fluttering in chest and SOB. CXR- bil pulmonary edema. Found to have acute on chronic CHF, A-fib with RVR and severe mitral regurgitation.s/p cardioversion 2/3. Pt underwent Mitraclip on 2/9, remained intubated after procedure until extubation on 2/10. PMH includes PAF, moderate AS, COPD, quit smoking 25 years ago, PAD, HTN, CVA, NICM, tobacco abuse, ischemic bowel with resection in 2017,11/04/22 left ureteral calculus S/P utreteroscopy and stent and chronic HFrEF.    PT Comments    Pt on BiPAP earlier today and had just come off of it about an hour prior to PT session. Pt was requesting to get OOB. She was able to progress to ambulating up to ~80 ft with an Multimedia programmer today. However, her core and lower extremity weakness along with her deficits in balance and endurance resulted in her requiring modA to do so. Pt is at high risk for falls and has continued to functionally decline during her hospitalization. PTA, she was mod I with a cane. Thus, updated d/c recs to AIR as she is motivated to improve, has had a significant change in functional status, and would greatly benefit from intensive therapy. Will continue to follow acutely.    Recommendations for follow up therapy are one component of a multi-disciplinary discharge planning process, led by the attending physician.  Recommendations may be updated based on patient status, additional functional criteria and insurance authorization.  Follow Up Recommendations  Acute inpatient rehab (3hours/day)     Assistance Recommended at Discharge Frequent or constant Supervision/Assistance  Patient can return home with the following A little help with bathing/dressing/bathroom;Help with stairs or ramp for entrance;Assist for transportation;Assistance with  cooking/housework;A lot of help with walking and/or transfers   Equipment Recommendations  Rolling walker (2 wheels);BSC/3in1;Wheelchair (measurements PT);Wheelchair cushion (measurements PT)    Recommendations for Other Services Rehab consult     Precautions / Restrictions Precautions Precautions: Fall;Other (comment) Precaution Comments: vitals, cortrak Restrictions Weight Bearing Restrictions: No     Mobility  Bed Mobility Overal bed mobility: Needs Assistance Bed Mobility: Sit to Supine, Supine to Sit     Supine to sit: Min guard, HOB elevated Sit to supine: Mod assist, Min assist   General bed mobility comments: Min-modA for managing BLEs onto bed. Min guard and extra time with use of bed rails and HOB elevated to sit up    Transfers Overall transfer level: Needs assistance Equipment used:  (eva walker) Transfers: Sit to/from Stand Sit to Stand: Min assist           General transfer comment: MinA to power up to stand from EOB, cuing pt for hand placement.    Ambulation/Gait Ambulation/Gait assistance: Mod assist, +2 safety/equipment Gait Distance (Feet): 80 Feet Assistive device: Ethelene Hal Gait Pattern/deviations: Decreased stride length, Narrow base of support, Decreased step length - right, Decreased step length - left, Trunk flexed, Knee flexed in stance - left, Knee flexed in stance - right Gait velocity: decreased Gait velocity interpretation: <1.31 ft/sec, indicative of household ambulator   General Gait Details: Slow, unsteady gait with increased knee flexion bilaterally, narrow BOS and flexed cervical/thoracic spine. Cues to extend trunk and legs for improved posture and leg stability, momentary success. ModA to steady with gait into hall and back. +2 for line management   Stairs  Wheelchair Mobility    Modified Rankin (Stroke Patients Only)       Balance Overall balance assessment: Needs assistance Sitting-balance support:  No upper extremity supported, Feet supported Sitting balance-[name]: Fair     Standing balance support: Bilateral upper extremity supported, Reliant on assistive device for balance, During functional activity Standing balance-[name]: Poor Standing balance comment: with decreased balance nad requiring Min-Mod A for maintaining balance                            Cognition Arousal/Alertness: Awake/alert Behavior During Therapy: Flat affect, Anxious Overall Cognitive Status: Impaired/Different from baseline Area of Impairment: Problem solving, Memory, Awareness, Safety/judgement                     Memory: Decreased short-term memory   Safety/Judgement: Decreased awareness of safety Awareness: Emergent Problem Solving: Slow processing, Requires verbal cues General Comments: Pt anxious at times in regards to lying back after gait, reporting fear of having to put BiPAP back on. Pt needing cues to wait for PT to drop bed rails and manage lines before trying to get OOB.        Exercises      General Comments General comments (skin integrity, edema, etc.): unreliable SpO2 waveforms, but pt satting end of session in 90s% on 6L      Pertinent Vitals/Pain Pain Assessment Pain Assessment: Faces Faces Pain Scale: Hurts a little bit Pain Location: generalized with mobility Pain Descriptors / Indicators: Grimacing Pain Intervention(s): Monitored during session, Limited activity within patient's tolerance, Repositioned    Home Living                          Prior Function            PT Goals (current goals can now be found in the care plan section) Acute Rehab PT Goals Patient Stated Goal: to walk PT Goal Formulation: With patient/family Time For Goal Achievement: 01/28/22 Potential to Achieve Goals: Good Progress towards PT goals: Progressing toward goals    Frequency    Min 3X/week      PT Plan Discharge plan needs to be  updated;Equipment recommendations need to be updated    Co-evaluation              AM-PAC PT "6 Clicks" Mobility   Outcome Measure  Help needed turning from your back to your side while in a flat bed without using bedrails?: A Little Help needed moving from lying on your back to sitting on the side of a flat bed without using bedrails?: A Little Help needed moving to and from a bed to a chair (including a wheelchair)?: A Little Help needed standing up from a chair using your arms (e.g., wheelchair or bedside chair)?: A Little Help needed to walk in hospital room?: A Lot Help needed climbing 3-5 steps with a railing? : A Lot 6 Click Score: 16    End of Session Equipment Utilized During Treatment: Oxygen;Gait belt Activity Tolerance: Patient tolerated treatment well;Patient limited by fatigue Patient left: in bed;with call bell/phone within reach;with bed alarm set;with family/visitor present Nurse Communication: Mobility status PT Visit Diagnosis: Unsteadiness on feet (R26.81);Muscle weakness (generalized) (M62.81);Other abnormalities of gait and mobility (R26.89);Difficulty in walking, not elsewhere classified (R26.2)     Time: 0867-6195 PT Time Calculation (min) (ACUTE ONLY): 29 min  Charges:  $Gait Training: 8-22 mins $Therapeutic Activity:  8-22 mins                     Moishe Spice, PT, DPT Acute Rehabilitation Services  Pager: [phone] Office: [place] 2022/02/10, 6:14 PM

## 2022-02-02 NOTE — Progress Notes (Signed)
° °  Patient passed at 9:08p. Family at bedside.   No heart or breath sounds.   Glori Bickers, MD  9:15 PM

## 2022-02-02 NOTE — Progress Notes (Signed)
ANTICOAGULATION CONSULT NOTE Pharmacy Consult for heparin Indication: atrial fibrillation Brief A/P: Heparin level subtherapeutic Increase Heparin rate Labs: Recent Labs    01/17/22 0424 01/17/22 0907 01/18/22 0414 02/15/22 0451  HGB 10.0*  --  9.8* 9.5*  HCT 31.7*  --  31.3* 30.1*  PLT 144*  --  131* PENDING  HEPARINUNFRC  --  0.52 0.29* 0.16*  CREATININE 1.62* 1.62* 1.47*  --     Assessment: 81 y.o. female with h/o Afib, Eliquis on hold, for heparin  Goal of Therapy:  Heparin level 0.3-0.7 units/ml   Plan:  Increase Heparin 1450 units/hr Check heparin level in 8 hours.  Phillis Knack, PharmD, BCPS  2022/02/15 5:44 AM

## 2022-02-02 NOTE — Progress Notes (Addendum)
PHARMACY - TOTAL PARENTERAL NUTRITION CONSULT NOTE  Indication: Intolerance to EN  Patient Measurements: Height: 5\' 4"  (162.6 cm) Weight: 57.5 kg (126 lb 12.2 oz) IBW/kg (Calculated) : 54.7 TPN AdjBW (KG): 52.2 Body mass index is 21.76 kg/m. Usual weight 54-56 kg  Assessment:  80 YOF presented 1/30 with HF exacerbation.  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  Patient has been eating poorly since admission.  She was \\started  on nocturnal TF on 2/6, cortrak dislodged on 2/9 after Mitraclip procedure, then continuous TF resumed on 2/10 post extubation.  She was having nausea so TF held 2/12.  Pharmacy consulted to dose TPN.  Cortrak replaced 2/13, trickle feeds started.   Glucose / Insulin: no hx DM - A1C 5.2; CBGs < 180 Used 2 units sSSI in past 24 hrs (High BG off BMP is likely contaminated with TPN since all other POC BG checks are < 180)  Electrolytes: Na 128 (Samsca 15mg  on 2/11, max Na in TPN),   CL 93 up, CoCa 9.9 others WNL Renal: SCr 1.42 down, BUN 38 up; furosemide IV x3 ordered 2/15-2/16  GI/Hepatic: On colestipol (home med) and megestrol for appetite; LFTs / tbili WNL, prealbumin 11.1 on 2/6, albumin 2.1, TG 70 Intake / Output; MIVF: furosemide IV x1 > UOP 1645 ml charted, net +14.1 L (weight 52.2 > 57.5 kg likely d/t volume), no further diarrhea GI Imaging: only abd XRs to confirm tube placement GI Surgeries / Procedures: N/A  Central access: PICC placed 01/05/22 TPN start date: 01/16/22  Nutritional Goals:  RD Estimated Needs Total Energy Estimated Needs: 1700-1900 Total Protein Estimated Needs: 80-95 grams Total Fluid Estimated Needs: >/= 1.7 L  Current Nutrition:  FLD - 2/13- had 4 bowls of soup    2/14 - had half of grits and soup for breakfast, nibbled on food per RN  TF at 79ml/hr - tolerating per RN TPN  Plan:  Continue concentrated TPN to goal 55 ml/hr per CCM TPN provides 90 g AA  and 1703 kCal, meeting 100% of patient's needs Electrolytes in TPN: Continue Na 162mEq/L, Ca 3 mEq/L, Phos 10 mmol/L, max CL; Increase K 10 mEq/L, Mg to 3 mEq/L Continue daily multivitamin PT (no MVI and trace elements in TPN) Decrease sensitive SSI to Q 8 Hr F/U AM labs, and diuresis  Plan to taper off TPN when tolerating goal TF for 48 hours  Will change MVI to PO due to IV MVI shortage after today's TPN  Benetta Spar, PharmD, BCPS, [facility] Clinical Pharmacist  Please check AMION for all [facility] phone numbers After 10:00 PM, call [place]

## 2022-02-02 NOTE — Death Summary Note (Addendum)
°  Advanced Heart Failure Death Summary  Death Summary   Patient ID: [name] MRN: [id number], DOB/AGE: [birth year] 81 y.o. Admit date: 12/26/2021 D/C date:     01/20/22   Primary Discharge Diagnoses:  Acute on Chronic Heart Failure>>Cardiogenic shock Severe Mitral Regurgitation s/p MitraClip  Acute on Chronic Hypoxic Respiratory Failure Persistent Atrial Fibrillation  Right Upper Lobe PNA  Acute Kidney Injury Severe COPD  Severe Protein Calorie Malnutrition  Iron Deficiency Anemia  Hyponatremia   Hospital Course:    81 y/o woman with PAF, mild NICM and severe COPD, former smoker. [facility] 02/19/2016 showed mild non-obstructive CAD. Echo 2017-02-18 EF 45-50%.   Seen in EP clinic 11/22. Feeling well. No ECG done. Echo ordered for murmur.  Echo done 12/12 EF 25-30% severe MR -> was in NSR during this study.  Outpatient TEE 12/28/21 showed EF 25-30% and severe MR. Referral placed for MitraClip evaluation.    Presented to ED 01/01/2022 w/ CC of several week h/o worsening functional capacity. Admitted with decompensated HF and AF with RVR. Symptomatically improved with diuresis. TEE 12/28/21 showed EF 25-30% and severe MR. Not a candidate for traditional surgical repair given age, fragility and other comorbidities.   She was evaluated for MitraClip. Underwent subsequent R/LHC on 2/2 which showed preserved cardiac output, min nonob CAD, elevated PCWP suggestive of severe MR. On 2/4 underwent TEE/DCCV,  LVEF 15%, RV severely HK. DC-CV to NSR but ultimately reverted back to Afib.   On 2/7, she developed low-output HF and was started on milrinone. Once stabilized, she underwent MitraClip A2/P2. F/u echo 2/10 EF < 20% RV moderately decreased MV clip ok. Mild to mod MR.   Post procedural course was c/b acute on chronic hypoxic respiratory failure, in the setting of RUL PNA and severe underlying COPD. She was treated w/ antibiotics. Unable to wean off inotropic and pressor support. Continued on Milrinone and  required NE for BP support. Also w/ hyponatremia, severe deconditioning and severe protein calorie malnutrition requiring tube feeds.    Given course and failure to thrive, after [place] discussion, pt expressed desire to stop all medical therapy and transition to comfort care. Pressors were stopped and comfort drips started.   Patient pronounced on 2023-01-20. TOD 02/19/07.   Significant Diagnostic Studies  TEE 12/28/21 showed EF 25-30% and severe MR. 2/2 RHC/LHC - Had cath with preserved cardiac output, min nonob CAD, elevated PCWP suggestive of severe MR.  2/4 TEE/DC-CV  LVEF 15% RV severely HK. DC-CV to NSR 2/09 s/p Mitraclip A2/P2 2/10 Echo EF < 20% RV moderately decreased MV clip ok. Mild to mod MR.   Consultations  Advanced Heart Failure Structural Heart  Pulmonary and Critical Care Medicine   Duration of Discharge Encounter: Greater than 35 minutes   Signed, Lyda Jester, PA-C 01/20/2022, 12:06 PM  Agree with above.  Glori Bickers, MD  12:30 PM

## 2022-02-02 NOTE — Progress Notes (Signed)
Patient removes PIV and pulse ox. Heparin infusing. New bag of heparin initiated. Old heparin disposed of and blood cleaned up. Patient states to husband. "I am not staying another night in this place. I just can't do it. Especially if I have that nurse from last night again."

## 2022-02-02 NOTE — Progress Notes (Signed)
78 mL of dilaudid wasted in stericycle. Witnessed by Sharen Heck, RN.

## 2022-02-02 NOTE — Progress Notes (Signed)
Advanced Heart Failure Rounding Note  PCP-Cardiologist: Will Meredith Leeds, MD   Subjective:    2/1 A fib RVR--> Started on amio drip + heparin drip. PICC placed. Diuretics held.  2/2 RHC/LHC - Had cath with preserved cardiac output, min nonob CAD, elevated PCWP suggestive of severe MR.  2/4 TEE/DC-CV  LVEF 15% RV severely HK. DC-CV to NSR 2/7 Milrinone added for low co-ox (50%). 2/09 s/p Mitraclip A2/P2 2/10 Echo EF < 20% RV moderately decreased MV clip ok. Mild to mod MR.  2/12 Started on abx for RUL PNA  Remains on milrinone 0.125 + NE 12. Co-ox 67%. No a-line. Cuff SBPs low 90s.   Has been off diuretics w/ low volume status. Wt now trending up, up 4 lb. PO intake picking up. Ate several bowls of soup yesterday. CVP 8 today   Scr 1.62>>1.47   Na 127 on BMP, glucose 303. Corrected Na 132.  Ambulated w/ PT today. Still weak but feels but feels like she is improving. Still SOB, on supp O2.    Objective:   Weight Range: 57.5 kg Body mass index is 21.76 kg/m.   Vital Signs:   Temp:  [96.3 F (35.7 C)-97.6 F (36.4 C)] 96.4 F (35.8 C) (02/15 0734) Pulse Rate:  [76-213] 105 (02/15 0825) Resp:  [14-30] 26 (02/15 0830) BP: (76-114)/(51-82) 104/65 (02/15 0830) SpO2:  [85 %-99 %] 90 % (02/15 0825) Last BM Date : 01/16/22  Weight change: Filed Weights   01/16/22 0500 01/17/22 0500 01/18/22 0600  Weight: 55.6 kg 55.6 kg 57.5 kg    Intake/Output:   Intake/Output Summary (Last 24 hours) at 24-Jan-2022 0834 Last data filed at 01/24/2022 0600 Gross per 24 hour  Intake 2713.24 ml  Output 1425 ml  Net 1288.24 ml       Physical Exam   CVP 8  General:  thin frail appearing. No respiratory difficulty HEENT: normal + cortrak  Neck: supple. JVD 8 cm. Carotids 2+ bilat; no bruits. No lymphadenopathy or thyromegaly appreciated. Cor: PMI nondisplaced. Irregularly irregular rhythm. No rubs, gallops or murmurs. Lungs: bilateral crackles R>L  Abdomen: soft, nontender,  nondistended. No hepatosplenomegaly. No bruits or masses. Good bowel sounds. Extremities: no cyanosis, clubbing, rash, trace b/l ankle edema Neuro: alert & oriented x 3, cranial nerves grossly intact. moves all 4 extremities w/o difficulty. Affect pleasant.    Telemetry   Atrial fibrillation 90s-low 100s, occasional PVCs Personally reviewed   Labs    CBC Recent Labs    01/18/22 0414 01/24/22 0451  WBC 8.2 7.4  NEUTROABS 6.0 5.8  HGB 9.8* 9.5*  HCT 31.3* 30.1*  MCV 86.9 96.2  PLT 131* 106*    Basic Metabolic Panel Recent Labs    01/18/22 0414 01-24-2022 0600  NA 127* 128*  K 4.5 4.1  CL 90* 93*  CO2 27 24  GLUCOSE 303* 423*  BUN 28* 38*  CREATININE 1.47* 1.42*  CALCIUM 8.7* 8.4*  MG 2.4 2.0  PHOS 2.9 3.4    Liver Function Tests Recent Labs    01/16/22 1330 01/17/22 0424  AST 14* 12*  ALT 9 10  ALKPHOS 68 66  BILITOT 0.4 0.2*  PROT 5.1* 4.7*  ALBUMIN 2.3* 2.1*     No results for input(s): LIPASE, AMYLASE in the last 72 hours. Cardiac Enzymes No results for input(s): CKTOTAL, CKMB, CKMBINDEX, TROPONINI in the last 72 hours.  BNP: BNP (last 3 results) Recent Labs    12/14/2021 1737 01/15/22 0950  BNP 2,812.3*  1,297.4*     ProBNP (last 3 results) No results for input(s): PROBNP in the last 8760 hours.   D-Dimer No results for input(s): DDIMER in the last 72 hours. Hemoglobin A1C No results for input(s): HGBA1C in the last 72 hours. Fasting Lipid Panel Recent Labs    01/17/22 0424  TRIG 70    Thyroid Function Tests No results for input(s): TSH, T4TOTAL, T3FREE, THYROIDAB in the last 72 hours.  Invalid input(s): FREET3   Other results:   Imaging    No results found.   Medications:     Scheduled Medications:  arformoterol  15 mcg Nebulization BID   budesonide (PULMICORT) nebulizer solution  0.25 mg Nebulization BID   chlorhexidine  15 mL Mouth Rinse BID   Chlorhexidine Gluconate Cloth  6 each Topical Daily   colestipol   1 g Per Tube BID   fluticasone  1 spray Each Nare BID   hydroxychloroquine  400 mg Per Tube QHS   insulin aspart  0-9 Units Subcutaneous Q8H   levothyroxine  25 mcg Per Tube QAC breakfast   loratadine  10 mg Per Tube Daily   mouth rinse  15 mL Mouth Rinse q12n4p   megestrol  400 mg Per Tube Daily   midodrine  5 mg Oral TID WC   montelukast  10 mg Per Tube Daily   multivitamin with minerals  1 tablet Per Tube Daily   pantoprazole (PROTONIX) IV  40 mg Intravenous QHS   revefenacin  175 mcg Nebulization Daily   sodium chloride flush  10-40 mL Intracatheter Q12H   sodium chloride flush  3 mL Intravenous Q12H   sodium chloride flush  3 mL Intravenous Q12H    Infusions:  sodium chloride Stopped (01/14/22 0519)   sodium chloride     amiodarone 30 mg/hr (January 29, 2022 0725)   ceFEPime (MAXIPIME) IV 200 mL/hr at 01/18/22 2100   feeding supplement (VITAL 1.5 CAL) 20 mL/hr at 01/18/22 0700   heparin 1,450 Units/hr (01/29/22 0649)   norepinephrine (LEVOPHED) Adult infusion 3 mcg/min (01/29/2022 0600)   TPN ADULT (ION) 55 mL/hr at January 29, 2022 0600    PRN Medications: sodium chloride, sodium chloride, acetaminophen, ALPRAZolam, guaiFENesin-dextromethorphan, hydrALAZINE, ipratropium, labetalol, levalbuterol, melatonin, ondansetron (ZOFRAN) IV, phenol, sodium chloride flush, sodium chloride flush, sodium chloride flush, traZODone    Patient Profile   [name] is a 81 year old with a history of PAF, moderate AS, severe MR, COPD, quit smoking 25 years ago, PAD, HTN, hyperlipidemia, CVA, NICM, tobacco abuse, ischemic bowel with resection in 2017,11/04/22 left ureteral calculus S/P utreteroscopy and stent,  and chronic HFrEF.     Admitted A/C HFrEF and A fib RVR.   Assessment/Plan   A/C HFrEF - Echo this admit EF down to 25% from previous Echo EF 40-45% in 2018 .  BNP on admit > 2800.  - Has LBBB, may eventually benefit from CRT depending on progress - Cath 2/2 with minimal CAD preserved cardiac  output, elevated PCWP (intermittent v-waves up to 50) suggestive of severe MR.  - TEE 2/3 LVEF 15% RV severe HK. Severe MR - S/p mitral valve repair with mitraclip 02/09 - Echo 2/10 EF < 20% significant LV dyssynchrony due to LBBB. RV moderately decreased MV clip ok. Mild to mod MR. Personally reviewed - on Milrinone 0.125 + 12 NE. Co-ox 67%.  - Stop milrinone today and follow co-ox. - Continue to wean NE Keep MAP > 65 < 80. Add midodrine 5 tid to facilitate wean  -  CVP and wt trending up. CVP 8-9. Add Torsemide 20 mg daily  - Continue abx for RUL PNA - GDMT limited by need for NE - Echo with significant LV dyssynchrony. Suspect LBBB may be playing significant role here.    2. Severe MR  - 12/28/21 TEE with severe MR - s/p mitraclip A2/P2 02/09 - TEE post procedure with mild to moderate residual MR - Echo 2/10 EF < 20% significant LV dyssynchrony due to LBBB. RV moderately decreased MV clip ok. Mild to mod MR. Personally reviewed   3. Persistent AF - Last dose of eliquis 12/27/2021 (morning) -> heparin - s/p DC-CV 2/3 - Back in Afib, V-rates 90-100s - On amio gtt at 30. Transition to PO once off milrinone, though long term not ideal w/ pulmonary issues   - Plan repeat DC-CV this week as able   4. AKI  - Recently had diuretics increased on 1/5. Creatinine on that time was 1.2.  - On admit creatinine was 1.7 - SCr 1.5-> 1.6->1.5 today.  - follow BMP    5. A/C Respiratory Failure with RUL PNA - On exam has severe underlying COPD - Volume status ok/low.  - CXR c/w RUL PNA.  PCT <0.10. Continue abx. D/w CCM at bedisdie   6. Anemia  - Hgb 9.8. Iron Sats 7%.  - Received feraheme 02/03  7. Hypokalemia/hypomagnesemia - stable today, K 4.5, Mg 2.4  - supp as needed  8. Severe protein calorie malnutrition - h/o embolic event to colon in 2017 requiring emergency R [facility]mi-colectomy at [facility] followed by colostomy and then takedown - Prealbumin 11.1 - Cor-trak placed 2/6 for nighttime  feeds. - Cor-trak moved post-pyloric - appetite is picking up   9. Severe deconditioning - PT/OT following  10. Hyponatremia - Rec'd tolvaptan 2/11 - Corrected Na 132 today    Length of Stay: [place], MD  01/25/22, 8:34 AM  Advanced Heart Failure Team Pager [phone] (M-F; 7a - 5p)  Please contact [facility] for night-coverage after hours (5p -7a ) and weekends on [email or web address]  Agree with above.   She is much worse this afternoon despite aggressive care. Now with refractory hypoxia. Refusing bipap. Very uncomfortable.   General:  Agitated anxious pale SOB.  HEENT: normal + cortrak Neck: supple. JVP 7-8 Carotids 2+ bilat; no bruits. No lymphadenopathy or thryomegaly appreciated. Cor: PMI nondisplaced. Regular rate & rhythm. No rubs, gallops or murmurs. Lungs: + RUL crackles Abdomen: soft, nontender, nondistended. No hepatosplenomegaly. No bruits or masses. Good bowel sounds. Extremities: no cyanosis, clubbing, rash, edema Neuro: alert & orientedx3, cranial nerves grossly intact. moves all 4 extremities w/o difficulty. Affect agitated uncomfortable  Long talk with her about her situation. She is very uncomfortable and says she no longer cares about living. We discussed the need for probable intubation as the next step. She adamantly refuses bipap, intubation. Also says she would not want CPR or cardioversion if arrested.   She appears to have end-stage multi-system failure. She is wanting to switch to comfort care. I d/w CCM who agrees. I called her husband who is on his way to the hospital to discuss. Will give low-dose morphine for comfort in the interim.  CRITICAL CARE Performed by: Glori Bickers  Total critical care time: 45 minutes  Critical care time was exclusive of separately billable procedures and treating other patients.  Critical care was necessary to treat or prevent imminent or life-threatening deterioration.  Critical care was time spent  personally by me (  independent of midlevel providers or residents) on the following activities: development of treatment plan with patient and/or surrogate as well as nursing, discussions with consultants, evaluation of patient's response to treatment, examination of patient, obtaining history from patient or surrogate, ordering and performing treatments and interventions, ordering and review of laboratory studies, ordering and review of radiographic studies, pulse oximetry and re-evaluation of patient's condition.  Glori Bickers, MD  6:14 PM

## 2022-02-02 NOTE — Progress Notes (Signed)
Critical care attending attestation note:  Patient seen and examined and relevant ancillary tests reviewed.  I agree with the assessment and plan of care as outlined by Georgann Housekeeper, NP.   Synopsis of assessment and plan:  81 year old woman  Critically ill due to Cardiogenic shock  Decompensated HFrEF  S/P mitral clipping this admission.  Critically ill due to COPD with acute on chronic hypercarbic respiratory failure Severe protein calorie malnutrition Chronic diarrhea  Insomnia  Complains of poor sleep. Chest clear, JVP 4cm ASA with CV waves. No edema. Color has improved.  Far more awake   pCO2 57. Na 130  Assessment:  - milrinone stopped yesterday, increased midodrine today to wean off norepinephrine.  - diurese today, increased volume load with tube feeds and TPN - BiPAP this morning to clear some CO2, can come off this afternoon to allow therapy and resume qhs - Transition to oral amiodarone now that she is off milrinone. - Continue Xanax and resume home Trazodone at night time .  - Stop tube feeds once patient is tolerating tube feeds for 48h. Overfeeding may now be causing CO2 to go up.    CRITICAL CARE Performed by: Kipp Brood   Total critical care time: 35 minutes  Critical care time was exclusive of separately billable procedures and treating other patients.  Critical care was necessary to treat or prevent imminent or life-threatening deterioration.  Critical care was time spent personally by me on the following activities: development of treatment plan with patient and/or surrogate as well as nursing, discussions with consultants, evaluation of patient's response to treatment, examination of patient, obtaining history from patient or surrogate, ordering and performing treatments and interventions, ordering and review of laboratory studies, ordering and review of radiographic studies, pulse oximetry, re-evaluation of patient's condition and participation in  multidisciplinary rounds.  Kipp Brood, MD [facility] ICU Physician [place]  Pager: [phone] Mobile: [phone] After hours: [phone].  01-28-22, 11:01 AM  PP

## 2022-02-02 NOTE — Progress Notes (Signed)
NAME:  [name], MRN:  [id number], DOB:  December 18, 1940, LOS: 5 ADMISSION DATE:  12/25/2021, CONSULTATION DATE:  01/29/2022 REFERRING MD:  , CHIEF COMPLAINT:  Respiratory Failure   History of Present Illness:  81 year old woman with hx of NICM, mitral failure, SSS, afib on AC, COPD who presented with decompensated heart failure.  After volume and inotrope optimization she underwent mitraclip 2/9 and remains intubated post procedure.  PCCM consulted for further management.  Pertinent  Medical History   has a past medical history of Allergic rhinitis, Anxiety, Atrial septal aneurysm, Basal cell carcinoma, Bradycardia, Cataract, Chronic combined systolic and diastolic CHF (congestive heart failure) ([place]), CKD (chronic kidney disease), stage III ([place]), Clostridium difficile infection, COPD (chronic obstructive pulmonary disease) ([place]), Depression (11/29/2013), DI (detrusor instability), Esophageal stricture, Hemorrhoids, Hiatal hernia, History of kidney stones, HLD (hyperlipidemia), HTN (hypertension), Ischemic colon ([place]), Lung nodule, Mild CAD, Mitral regurgitation, Nephrolithiasis, NICM (nonischemic cardiomyopathy) ([place]), Orthostatic hypotension, Osteoarthritis, PAF (paroxysmal atrial fibrillation) ([place]), Pneumonia (2013), PVC's (premature ventricular contractions), S/P mitral valve clip implantation (01/26/2022), Shingles (since Nov 18, 2013), Status post dilation of esophageal narrowing, Stroke ([place]) (2001), Syncope, and TIA (transient ischemic attack) (last 2001).  Significant Hospital Events: Including procedures, antibiotic start and stop dates in addition to other pertinent events   mitraclip 2/9 TEE 2/9 2/10 extubated 2/12 TPN started  Interim History / Subjective:  Anxious overnight. Did not sleep well. Some mild resp distress this morning. Hypercarbic on gas.   Objective   Blood pressure (!) 81/51, pulse (!) 105, temperature (!) 96.4 F (35.8 C), temperature source Axillary, resp.  rate (!) 23, height 5\' 4"  (1.626 m), weight 57.5 kg, SpO2 90 %. CVP:  [8 mmHg-10 mmHg] 10 mmHg      Intake/Output Summary (Last 24 hours) at 02-11-2022 1003 Last data filed at 2022-02-11 0109 Gross per 24 hour  Intake 3150.61 ml  Output 1710 ml  Net 1440.61 ml    Filed Weights   01/16/22 0500 01/17/22 0500 01/18/22 0600  Weight: 55.6 kg 55.6 kg 57.5 kg    Examination:  General: Frail elderly female. Appears mildly dyspneic.  HENT: [place]/At, Cortrak in place.  Lungs: Coarse crackles posteriorly. Tachypneic  Cardiovascular: IRIR, tachy. No edema.  Abdomen: Soft, non-tender, non-distended.  Extremities: No acute deformity or ROM limitation.  Neuro: Alert, oriented. Anxious. Non-focal.   Ancillary tests personally reviewed:    Echo shows biventricular failure with LVEF < 20% I/O + 1.3L last 24 hours.  ABG 7.29, 57, 66, 28.2 Sodium 128  Assessment & Plan:   Cardiogenic shock due to decompensated HFrEF from NICM Severe MR now status post mitral clip.  - Off milrinone - Midodrine - Titrate NE to keep MAP >65. Weaning. Should be off this morning.  - Appreciate heart failure service.  - Additional diuresis today.  Persistent AF - Amiodarone to oral - Repeat DC-CV this week per cardiology  Acute on chronic respiratory insufficiency due to COPD Acute hypercarbic respiratory failure RUL PNA - Cefepime for suspected aspiration event 2/12 > 2/18.  - BiPAP starting this morning PRN - Trial QHS BiPAP  Hyponatremia due to volume contraction.  127 -s/p tolvaptan 1/11 -Free water restrict -Trend chemistry  Chronic diarrhea status post partial colectomy.  - Colestid and immodium for diarrhea.  - Chronic diarrhea workup still pending. Calprotectin 2/12  Severe Malnutrition with cachexia related to HF GERD with prior peptic stricture - PPI - TPN currenlty 100% goal - Cortrak in place.  Initially wanted to  increase from trickle, but now needing BiPAP will hold off.    Deconditioning - PT   Best Practice (right click and "Reselect all SmartList Selections" daily)   Diet/type: continue tube feeds and introduce oral intake DVT prophylaxis: systemic heparin GI prophylaxis: N/A Lines: yes and it is still needed Foley:  N/A Code Status:  full code Last date of multidisciplinary goals of care discussion [patient and husband updated at the bedside.]  Critical care time:  38 minutes  Georgann Housekeeper, AGACNP-BC [place] for personal pager PCCM on call pager [phone] until 7pm. Please call Elink 7p-7a. [phone]  February 14, 2022 10:03 AM

## 2022-02-02 NOTE — Progress Notes (Signed)
Chaplain responded to call from staff. Patient deceased. Family in room.  Chaplain arrived to find husband and many family members grieving.  Chaplain offered presence, support and prayers. Rev. Tamsen Snider Pager [phone]

## 2022-02-02 DEATH — deceased

## 2022-02-14 ENCOUNTER — Ambulatory Visit: Payer: Medicare Other

## 2022-02-14 ENCOUNTER — Other Ambulatory Visit (HOSPITAL_COMMUNITY): Payer: Medicare Other

## 2022-02-14 ENCOUNTER — Ambulatory Visit: Payer: Medicare Other | Admitting: Nurse Practitioner
